# Patient Record
Sex: Female | Born: 1965 | Race: White | Hispanic: No | Marital: Married | Smoking: Former smoker
Health system: Southern US, Community
[De-identification: ages and names within clinical notes are randomized; demographics above are authoritative.]

## PROBLEM LIST (undated history)

## (undated) ENCOUNTER — Emergency Department (HOSPITAL_COMMUNITY): Payer: Commercial Managed Care - PPO | Source: Home / Self Care

## (undated) DIAGNOSIS — R519 Headache, unspecified: Secondary | ICD-10-CM

## (undated) DIAGNOSIS — K859 Acute pancreatitis without necrosis or infection, unspecified: Secondary | ICD-10-CM

## (undated) DIAGNOSIS — G43909 Migraine, unspecified, not intractable, without status migrainosus: Secondary | ICD-10-CM

## (undated) DIAGNOSIS — K219 Gastro-esophageal reflux disease without esophagitis: Secondary | ICD-10-CM

## (undated) DIAGNOSIS — H6990 Unspecified Eustachian tube disorder, unspecified ear: Secondary | ICD-10-CM

## (undated) DIAGNOSIS — Z923 Personal history of irradiation: Secondary | ICD-10-CM

## (undated) DIAGNOSIS — E7800 Pure hypercholesterolemia, unspecified: Secondary | ICD-10-CM

## (undated) DIAGNOSIS — J189 Pneumonia, unspecified organism: Secondary | ICD-10-CM

## (undated) DIAGNOSIS — C801 Malignant (primary) neoplasm, unspecified: Secondary | ICD-10-CM

## (undated) DIAGNOSIS — H9190 Unspecified hearing loss, unspecified ear: Secondary | ICD-10-CM

## (undated) DIAGNOSIS — J342 Deviated nasal septum: Secondary | ICD-10-CM

## (undated) DIAGNOSIS — I87393 Chronic venous hypertension (idiopathic) with other complications of bilateral lower extremity: Secondary | ICD-10-CM

## (undated) DIAGNOSIS — H6980 Other specified disorders of Eustachian tube, unspecified ear: Secondary | ICD-10-CM

## (undated) DIAGNOSIS — J449 Chronic obstructive pulmonary disease, unspecified: Secondary | ICD-10-CM

## (undated) DIAGNOSIS — R109 Unspecified abdominal pain: Secondary | ICD-10-CM

## (undated) DIAGNOSIS — G8929 Other chronic pain: Secondary | ICD-10-CM

## (undated) DIAGNOSIS — J309 Allergic rhinitis, unspecified: Secondary | ICD-10-CM

## (undated) HISTORY — PX: WRIST SURGERY: SHX841

## (undated) HISTORY — DX: Acute pancreatitis without necrosis or infection, unspecified: K85.90

## (undated) HISTORY — DX: Personal history of irradiation: Z92.3

## (undated) HISTORY — DX: Chronic obstructive pulmonary disease, unspecified: J44.9

## (undated) HISTORY — PX: LAPAROSCOPIC ENDOMETRIOSIS FULGURATION: SUR769

## (undated) HISTORY — PX: TUBAL LIGATION: SHX77

## (undated) HISTORY — PX: TYMPANOSTOMY TUBE PLACEMENT: SHX32

## (undated) HISTORY — PX: ADENOIDECTOMY: SUR15

## (undated) HISTORY — PX: TONSILLECTOMY AND ADENOIDECTOMY: SUR1326

## (undated) HISTORY — PX: APPENDECTOMY: SHX54

## (undated) HISTORY — PX: KNEE ARTHROSCOPY: SHX127

## (undated) HISTORY — DX: Chronic venous hypertension (idiopathic) with other complications of bilateral lower extremity: I87.393

## (undated) MED FILL — Dexamethasone Sodium Phosphate Inj 100 MG/10ML: INTRAMUSCULAR | Qty: 1 | Status: AC

---
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
## 1993-02-01 HISTORY — PX: ABDOMINAL HYSTERECTOMY: SHX81

## 1997-05-29 ENCOUNTER — Encounter: Admission: RE | Admit: 1997-05-29 | Discharge: 1997-05-29 | Payer: Self-pay | Admitting: Family Medicine

## 1997-07-29 ENCOUNTER — Encounter: Admission: RE | Admit: 1997-07-29 | Discharge: 1997-07-29 | Payer: Self-pay | Admitting: Family Medicine

## 1997-07-31 ENCOUNTER — Inpatient Hospital Stay (HOSPITAL_COMMUNITY): Admission: EM | Admit: 1997-07-31 | Discharge: 1997-08-01 | Payer: Self-pay | Admitting: Emergency Medicine

## 1997-10-21 ENCOUNTER — Encounter: Admission: RE | Admit: 1997-10-21 | Discharge: 1998-01-19 | Payer: Self-pay

## 1997-10-21 ENCOUNTER — Emergency Department (HOSPITAL_COMMUNITY): Admission: EM | Admit: 1997-10-21 | Discharge: 1997-10-21 | Payer: Self-pay | Admitting: Emergency Medicine

## 1997-10-23 ENCOUNTER — Encounter: Admission: RE | Admit: 1997-10-23 | Discharge: 1998-01-21 | Payer: Self-pay

## 1997-10-23 ENCOUNTER — Encounter: Admission: RE | Admit: 1997-10-23 | Discharge: 1997-10-23 | Payer: Self-pay | Admitting: Family Medicine

## 1997-10-30 ENCOUNTER — Encounter: Admission: RE | Admit: 1997-10-30 | Discharge: 1997-10-30 | Payer: Self-pay | Admitting: Family Medicine

## 1997-11-12 ENCOUNTER — Encounter: Admission: RE | Admit: 1997-11-12 | Discharge: 1997-11-12 | Payer: Self-pay | Admitting: Family Medicine

## 1997-11-20 ENCOUNTER — Encounter: Admission: RE | Admit: 1997-11-20 | Discharge: 1997-11-20 | Payer: Self-pay | Admitting: Family Medicine

## 1997-12-30 ENCOUNTER — Encounter: Admission: RE | Admit: 1997-12-30 | Discharge: 1998-03-30 | Payer: Self-pay | Admitting: *Deleted

## 1998-01-14 ENCOUNTER — Encounter: Admission: RE | Admit: 1998-01-14 | Discharge: 1998-02-10 | Payer: Self-pay | Admitting: *Deleted

## 1998-02-14 ENCOUNTER — Encounter: Admission: RE | Admit: 1998-02-14 | Discharge: 1998-02-14 | Payer: Self-pay | Admitting: Family Medicine

## 1998-06-23 ENCOUNTER — Encounter: Admission: RE | Admit: 1998-06-23 | Discharge: 1998-06-23 | Payer: Self-pay | Admitting: Family Medicine

## 1998-08-06 ENCOUNTER — Encounter: Admission: RE | Admit: 1998-08-06 | Discharge: 1998-08-06 | Payer: Self-pay | Admitting: Family Medicine

## 1998-09-24 ENCOUNTER — Encounter: Admission: RE | Admit: 1998-09-24 | Discharge: 1998-09-24 | Payer: Self-pay | Admitting: Family Medicine

## 1998-10-10 ENCOUNTER — Encounter: Admission: RE | Admit: 1998-10-10 | Discharge: 1998-10-10 | Payer: Self-pay | Admitting: Family Medicine

## 1998-11-28 ENCOUNTER — Encounter: Admission: RE | Admit: 1998-11-28 | Discharge: 1998-11-28 | Payer: Self-pay | Admitting: Sports Medicine

## 1999-05-05 ENCOUNTER — Ambulatory Visit (HOSPITAL_COMMUNITY): Admission: RE | Admit: 1999-05-05 | Discharge: 1999-05-05 | Payer: Self-pay | Admitting: Family Medicine

## 1999-05-05 ENCOUNTER — Encounter: Payer: Self-pay | Admitting: Internal Medicine

## 2001-09-13 ENCOUNTER — Encounter: Payer: Self-pay | Admitting: Emergency Medicine

## 2001-09-13 ENCOUNTER — Emergency Department (HOSPITAL_COMMUNITY): Admission: EM | Admit: 2001-09-13 | Discharge: 2001-09-13 | Payer: Self-pay | Admitting: Emergency Medicine

## 2001-09-30 ENCOUNTER — Encounter: Payer: Self-pay | Admitting: Chiropractic Medicine

## 2001-09-30 ENCOUNTER — Ambulatory Visit (HOSPITAL_COMMUNITY): Admission: RE | Admit: 2001-09-30 | Discharge: 2001-09-30 | Payer: Self-pay | Admitting: Chiropractic Medicine

## 2001-10-17 ENCOUNTER — Encounter: Payer: Self-pay | Admitting: Emergency Medicine

## 2001-10-17 ENCOUNTER — Emergency Department (HOSPITAL_COMMUNITY): Admission: EM | Admit: 2001-10-17 | Discharge: 2001-10-17 | Payer: Self-pay | Admitting: Emergency Medicine

## 2002-03-05 ENCOUNTER — Emergency Department (HOSPITAL_COMMUNITY): Admission: EM | Admit: 2002-03-05 | Discharge: 2002-03-05 | Payer: Self-pay | Admitting: Emergency Medicine

## 2002-03-13 ENCOUNTER — Encounter: Payer: Self-pay | Admitting: Emergency Medicine

## 2002-03-13 ENCOUNTER — Emergency Department (HOSPITAL_COMMUNITY): Admission: EM | Admit: 2002-03-13 | Discharge: 2002-03-13 | Payer: Self-pay | Admitting: Emergency Medicine

## 2002-08-07 ENCOUNTER — Other Ambulatory Visit: Admission: RE | Admit: 2002-08-07 | Discharge: 2002-08-07 | Payer: Self-pay | Admitting: Family Medicine

## 2002-11-27 ENCOUNTER — Ambulatory Visit (HOSPITAL_BASED_OUTPATIENT_CLINIC_OR_DEPARTMENT_OTHER): Admission: RE | Admit: 2002-11-27 | Discharge: 2002-11-27 | Payer: Self-pay | Admitting: Orthopedic Surgery

## 2003-05-17 ENCOUNTER — Encounter: Admission: RE | Admit: 2003-05-17 | Discharge: 2003-05-17 | Payer: Self-pay | Admitting: Family Medicine

## 2003-12-20 ENCOUNTER — Emergency Department (HOSPITAL_COMMUNITY): Admission: EM | Admit: 2003-12-20 | Discharge: 2003-12-20 | Payer: Self-pay | Admitting: Emergency Medicine

## 2009-07-25 ENCOUNTER — Ambulatory Visit (HOSPITAL_BASED_OUTPATIENT_CLINIC_OR_DEPARTMENT_OTHER): Admission: RE | Admit: 2009-07-25 | Discharge: 2009-07-25 | Payer: Self-pay | Admitting: Orthopedic Surgery

## 2010-01-10 ENCOUNTER — Emergency Department (HOSPITAL_COMMUNITY)
Admission: EM | Admit: 2010-01-10 | Discharge: 2010-01-10 | Payer: Self-pay | Source: Home / Self Care | Admitting: Emergency Medicine

## 2010-02-22 ENCOUNTER — Encounter: Payer: Self-pay | Admitting: Family Medicine

## 2010-06-19 NOTE — Op Note (Signed)
NAME:  Julie Nicholson, Julie Nicholson                        ACCOUNT NO.:  [id number]   MEDICAL RECORD NO.:  [id number]                   PATIENT TYPE:  AMB   LOCATION:  DSC                                  FACILITY:  [facility]   PHYSICIAN:  Leonides Grills, M.D.                  DATE OF BIRTH:  05/05/1965   DATE OF PROCEDURE:  11/27/2002  DATE OF DISCHARGE:                                 OPERATIVE REPORT   PREOPERATIVE DIAGNOSIS:  Right anterolateral ankle impingement syndrome.   POSTOPERATIVE DIAGNOSIS:  Right anterolateral ankle impingement syndrome.   OPERATION/PROCEDURE:  Right ankle arthroscopy with extensive debridement.   ANESTHESIA:  General endotracheal tube.   SURGEON:  Leonides Grills, M.D.   ASSISTANT:  None.   COMPLICATIONS:  None.   DISPOSITION:  Stable to PR.   INDICATIONS:  A 45 year old female who has had longstanding anterolateral  ankle pain that responded with an injection in regards to removing all her  pain but this recurred.  Due to the fact that this was interfering with her  life where she cannot do what she wants to do and she was tender directly  over the accessory tib-fib ligament, she was consented for the above  procedure.  All risks which include infection, nerve and vessel injury,  persistent pain, worsening pain, stiffness, arthritis and recurrence were  all explained.  Questions were encouraged and answered.   DESCRIPTION OF PROCEDURE:  The patient was brought to the operating room and  placed in the supine position.  After adequate general endotracheal tube  anesthesia was administered as well as Ancef 1 g IV piggyback.  The right  lower extremity was then prepped and draped in a sterile manner over the  proximally placed thigh tourniquet.  The limb was gravity exsanguinated.  The tourniquet was not used.  The anterior tibialis as well as the  superficial peroneal nerve were then mapped out.  A 20 cc of normal saline  was then instilled into the ankle from  anterolaterally.  A nick and spread  technique was then used to establish the anteromedial portal.  A blunt-  tipped trocar with cannula was then placed followed by a camera.  Under  direct visualization, the anterolateral portal was then created using a  spinal needle followed by the nick and spread technique.  The  instrumentation was then placed.  The radiofrequency bevel was then used to  remove all the synovitis that was located on the anterior aspect of the  ankle and with the up-biter, this was used to remove the accessory tib-fib  ligament that was impinging against the anterolateral aspect of the ankle  when the ankle was dorsiflexed and plantarflexed.  This was very obvious and  pictures were obtained.  Synovitis was then removed from the lateral gutter  of the ankle.  The camera was then removed.  The camera was then placed in  the anterolateral  portal.  Instrumentation through anteromedially, synovitis  was then removed.  Once this was removed, the cameras were removed.  Sutures  were closed with 4-0 nylon.  Marcaine without epinephrine was instilled in  the ankle.  A sterile dressing was applied.  CAM walker boot was applied.  The patient was table to the PR.                                                Leonides Grills, M.D.    PB/MEDQ  D:  11/27/2002  T:  11/27/2002  Job:  [id number]

## 2010-09-11 ENCOUNTER — Emergency Department (HOSPITAL_COMMUNITY)
Admission: EM | Admit: 2010-09-11 | Discharge: 2010-09-11 | Disposition: A | Discharge status: Home / Self Care | Payer: BC Managed Care – PPO | Attending: Emergency Medicine | Admitting: Emergency Medicine

## 2010-09-11 ENCOUNTER — Emergency Department (HOSPITAL_COMMUNITY): Payer: BC Managed Care – PPO

## 2010-09-11 DIAGNOSIS — E876 Hypokalemia: Secondary | ICD-10-CM | POA: Insufficient documentation

## 2010-09-11 DIAGNOSIS — R6889 Other general symptoms and signs: Secondary | ICD-10-CM | POA: Insufficient documentation

## 2010-09-11 DIAGNOSIS — H6590 Unspecified nonsuppurative otitis media, unspecified ear: Secondary | ICD-10-CM | POA: Insufficient documentation

## 2010-09-11 DIAGNOSIS — R059 Cough, unspecified: Secondary | ICD-10-CM | POA: Insufficient documentation

## 2010-09-11 DIAGNOSIS — R6883 Chills (without fever): Secondary | ICD-10-CM | POA: Insufficient documentation

## 2010-09-11 DIAGNOSIS — J40 Bronchitis, not specified as acute or chronic: Secondary | ICD-10-CM | POA: Insufficient documentation

## 2010-09-11 DIAGNOSIS — R51 Headache: Secondary | ICD-10-CM | POA: Insufficient documentation

## 2010-09-11 DIAGNOSIS — R05 Cough: Secondary | ICD-10-CM | POA: Insufficient documentation

## 2010-09-11 LAB — POCT I-STAT, CHEM 8
Calcium, Ion: 1.17 mmol/L (ref 1.12–1.32)
HCT: 42 % (ref 36.0–46.0)
Hemoglobin: 14.3 g/dL (ref 12.0–15.0)
TCO2: 25 mmol/L (ref 0–100)

## 2010-09-11 LAB — DIFFERENTIAL
Eosinophils Absolute: 0 10*3/uL (ref 0.0–0.7)
Eosinophils Relative: 0 % (ref 0–5)
Lymphs Abs: 0.9 10*3/uL (ref 0.7–4.0)
Monocytes Relative: 8 % (ref 3–12)

## 2010-09-11 LAB — CBC
MCH: 32.9 pg (ref 26.0–34.0)
MCHC: 35.7 g/dL (ref 30.0–36.0)
MCV: 92.3 fL (ref 78.0–100.0)
Platelets: 192 10*3/uL (ref 150–400)
RDW: 12.6 % (ref 11.5–15.5)

## 2011-01-07 ENCOUNTER — Other Ambulatory Visit: Payer: Self-pay | Admitting: Family Medicine

## 2011-01-07 ENCOUNTER — Ambulatory Visit
Admission: RE | Admit: 2011-01-07 | Discharge: 2011-01-07 | Disposition: A | Discharge status: Home / Self Care | Payer: BC Managed Care – PPO | Source: Ambulatory Visit | Attending: Family Medicine | Admitting: Family Medicine

## 2011-01-07 DIAGNOSIS — R062 Wheezing: Secondary | ICD-10-CM

## 2011-01-07 DIAGNOSIS — R05 Cough: Secondary | ICD-10-CM

## 2011-01-07 DIAGNOSIS — R059 Cough, unspecified: Secondary | ICD-10-CM

## 2013-02-08 ENCOUNTER — Encounter (HOSPITAL_COMMUNITY): Payer: Self-pay | Admitting: Emergency Medicine

## 2013-02-08 ENCOUNTER — Emergency Department (HOSPITAL_COMMUNITY)
Admission: EM | Admit: 2013-02-08 | Discharge: 2013-02-08 | Disposition: A | Discharge status: Home / Self Care | Payer: BC Managed Care – PPO | Attending: Emergency Medicine | Admitting: Emergency Medicine

## 2013-02-08 ENCOUNTER — Emergency Department (HOSPITAL_COMMUNITY): Payer: BC Managed Care – PPO

## 2013-02-08 DIAGNOSIS — R1011 Right upper quadrant pain: Secondary | ICD-10-CM | POA: Insufficient documentation

## 2013-02-08 DIAGNOSIS — Z90710 Acquired absence of both cervix and uterus: Secondary | ICD-10-CM | POA: Insufficient documentation

## 2013-02-08 DIAGNOSIS — Z7982 Long term (current) use of aspirin: Secondary | ICD-10-CM | POA: Insufficient documentation

## 2013-02-08 DIAGNOSIS — F17200 Nicotine dependence, unspecified, uncomplicated: Secondary | ICD-10-CM | POA: Insufficient documentation

## 2013-02-08 DIAGNOSIS — R110 Nausea: Secondary | ICD-10-CM | POA: Insufficient documentation

## 2013-02-08 DIAGNOSIS — K859 Acute pancreatitis without necrosis or infection, unspecified: Secondary | ICD-10-CM | POA: Insufficient documentation

## 2013-02-08 DIAGNOSIS — Z79899 Other long term (current) drug therapy: Secondary | ICD-10-CM | POA: Insufficient documentation

## 2013-02-08 HISTORY — DX: Acute pancreatitis without necrosis or infection, unspecified: K85.90

## 2013-02-08 LAB — CBC WITH DIFFERENTIAL/PLATELET
BASOS PCT: 0 % (ref 0–1)
Basophils Absolute: 0 10*3/uL (ref 0.0–0.1)
EOS ABS: 0.1 10*3/uL (ref 0.0–0.7)
Eosinophils Relative: 1 % (ref 0–5)
HEMATOCRIT: 40.4 % (ref 36.0–46.0)
HEMOGLOBIN: 14.2 g/dL (ref 12.0–15.0)
LYMPHS ABS: 3.3 10*3/uL (ref 0.7–4.0)
Lymphocytes Relative: 37 % (ref 12–46)
MCH: 33.6 pg (ref 26.0–34.0)
MCHC: 35.1 g/dL (ref 30.0–36.0)
MCV: 95.5 fL (ref 78.0–100.0)
MONO ABS: 0.9 10*3/uL (ref 0.1–1.0)
MONOS PCT: 10 % (ref 3–12)
NEUTROS PCT: 52 % (ref 43–77)
Neutro Abs: 4.6 10*3/uL (ref 1.7–7.7)
Platelets: 333 10*3/uL (ref 150–400)
RBC: 4.23 MIL/uL (ref 3.87–5.11)
RDW: 12.5 % (ref 11.5–15.5)
WBC: 8.9 10*3/uL (ref 4.0–10.5)

## 2013-02-08 LAB — COMPREHENSIVE METABOLIC PANEL
ALBUMIN: 3.9 g/dL (ref 3.5–5.2)
ALK PHOS: 135 U/L — AB (ref 39–117)
ALT: 27 U/L (ref 0–35)
AST: 18 U/L (ref 0–37)
BILIRUBIN TOTAL: 0.4 mg/dL (ref 0.3–1.2)
BUN: 15 mg/dL (ref 6–23)
CHLORIDE: 100 meq/L (ref 96–112)
CO2: 25 mEq/L (ref 19–32)
CREATININE: 0.69 mg/dL (ref 0.50–1.10)
Calcium: 9.7 mg/dL (ref 8.4–10.5)
GFR calc non Af Amer: >90 mL/min (ref >90)
GLUCOSE: 108 mg/dL — AB (ref 70–99)
POTASSIUM: 3.6 meq/L — AB (ref 3.7–5.3)
Sodium: 140 mEq/L (ref 137–147)
TOTAL PROTEIN: 7.7 g/dL (ref 6.0–8.3)

## 2013-02-08 LAB — URINALYSIS, ROUTINE W REFLEX MICROSCOPIC
BILIRUBIN URINE: NEGATIVE
Glucose, UA: NEGATIVE mg/dL
KETONES UR: NEGATIVE mg/dL
LEUKOCYTES UA: NEGATIVE
NITRITE: NEGATIVE
PROTEIN: NEGATIVE mg/dL
Specific Gravity, Urine: 1.002 — ABNORMAL LOW (ref 1.005–1.030)
UROBILINOGEN UA: 0.2 mg/dL (ref 0.0–1.0)
pH: 7 (ref 5.0–8.0)

## 2013-02-08 LAB — URINE MICROSCOPIC-ADD ON

## 2013-02-08 LAB — LIPASE, BLOOD: LIPASE: 104 U/L — AB (ref 11–59)

## 2013-02-08 MED ORDER — ONDANSETRON HCL 4 MG/2ML IJ SOLN
4.0000 mg | Freq: Once | INTRAMUSCULAR | Status: AC
  Administered 2013-02-08: 4 mg via INTRAVENOUS
  Filled 2013-02-08: qty 2

## 2013-02-08 MED ORDER — HYDROMORPHONE HCL PF 1 MG/ML IJ SOLN
1.0000 mg | Freq: Once | INTRAMUSCULAR | Status: AC
  Administered 2013-02-08: 1 mg via INTRAVENOUS
  Filled 2013-02-08: qty 1

## 2013-02-08 MED ORDER — PROMETHAZINE HCL 25 MG PO TABS: 25.0000 mg | ORAL_TABLET | Freq: Four times a day (QID) | ORAL | Status: DC | PRN

## 2013-02-08 MED ORDER — ONDANSETRON 8 MG PO TBDP: 8.0000 mg | ORAL_TABLET | Freq: Three times a day (TID) | ORAL | Status: DC | PRN

## 2013-02-08 MED ORDER — OXYCODONE-ACETAMINOPHEN 7.5-325 MG PO TABS: 1.0000 | ORAL_TABLET | ORAL | Status: DC | PRN

## 2013-02-08 MED ORDER — IOHEXOL 300 MG/ML  SOLN
100.0000 mL | Freq: Once | INTRAMUSCULAR | Status: AC | PRN
  Administered 2013-02-08: 100 mL via INTRAVENOUS

## 2013-02-08 MED ORDER — IOHEXOL 300 MG/ML  SOLN
25.0000 mL | Freq: Once | INTRAMUSCULAR | Status: AC | PRN
  Administered 2013-02-08: 25 mL via ORAL

## 2013-02-08 NOTE — ED Notes (Signed)
PT c/o R flank pain that radiates to RUQ.  Now c/o constant nausea, "burning burps" and intermittent fevers since Christmas.  Denies changes in bowel or bladder habits.

## 2013-02-08 NOTE — Discharge Instructions (Signed)
Acute Pancreatitis Acute pancreatitis is a disease in which the pancreas becomes suddenly inflamed. The pancreas is a large gland located behind your stomach. The pancreas produces enzymes that help digest food. The pancreas also releases the hormones glucagon and insulin that help regulate blood sugar. Damage to the pancreas occurs when the digestive enzymes from the pancreas are activated and begin attacking the pancreas before being released into the intestine. Most acute attacks last a couple of days and can cause serious complications. Some people become dehydrated and develop low blood pressure. In severe cases, bleeding into the pancreas can lead to shock and can be life-threatening. The lungs, heart, and kidneys may fail. CAUSES  Pancreatitis can happen to anyone. In some cases, the cause is unknown. Most cases are caused by:  Alcohol abuse.  Gallstones. Other less common causes are:  Certain medicines.  Exposure to certain chemicals.  Infection.  Damage caused by an accident (trauma).  Abdominal surgery. SYMPTOMS   Pain in the upper abdomen that may radiate to the back.  Tenderness and swelling of the abdomen.  Nausea and vomiting. DIAGNOSIS  Your caregiver will perform a physical exam. Blood and stool tests may be done to confirm the diagnosis. Imaging tests may also be done, such as X-rays, CT scans, or an ultrasound of the abdomen. TREATMENT  Treatment usually requires a stay in the hospital. Treatment may include:  Pain medicine.  Fluid replacement through an intravenous line (IV).  Placing a tube in the stomach to remove stomach contents and control vomiting.  Not eating for 3 or 4 days. This gives your pancreas a rest, because enzymes are not being produced that can cause further damage.  Antibiotic medicines if your condition is caused by an infection.  Surgery of the pancreas or gallbladder. HOME CARE INSTRUCTIONS   Follow the diet advised by your  caregiver. This may involve avoiding alcohol and decreasing the amount of fat in your diet.  Eat smaller, more frequent meals. This reduces the amount of digestive juices the pancreas produces.  Drink enough fluids to keep your urine clear or pale yellow.  Only take over-the-counter or prescription medicines as directed by your caregiver.  Avoid drinking alcohol if it caused your condition.  Do not smoke.  Get plenty of rest.  Check your blood sugar at home as directed by your caregiver.  Keep all follow-up appointments as directed by your caregiver. SEEK MEDICAL CARE IF:   You do not recover as quickly as expected.  You develop new or worsening symptoms.  You have persistent pain, weakness, or nausea.  You recover and then have another episode of pain. SEEK IMMEDIATE MEDICAL CARE IF:   You are unable to eat or keep fluids down.  Your pain becomes severe.  You have a fever or persistent symptoms for more than 2 to 3 days.  You have a fever and your symptoms suddenly get worse.  Your skin or the white part of your eyes turn yellow (jaundice).  You develop vomiting.  You feel dizzy, or you faint.  Your blood sugar is high (over 300 mg/dL). MAKE SURE YOU:   Understand these instructions.  Will watch your condition.  Will get help right away if you are not doing well or get worse. Document Released: 01/18/2005 Document Revised: 07/20/2011 Document Reviewed: 04/29/2011 [facility] Patient Information 2014 [place]. Clear Liquid Diet The clear liquid diet consists of foods that are liquid or will become liquid at room temperature. Examples of foods allowed  on a clear liquid diet include fruit juice, broth or bouillon, gelatin, or frozen ice pops. You should be able to see through the liquid. The purpose of this diet is to provide the necessary fluids, electrolytes (such as sodium and potassium), and energy to keep the body functioning during times when you are not  able to consume a regular diet. A clear liquid diet should not be continued for long periods of time, as it is not nutritionally adequate.  A CLEAR LIQUID DIET MAY BE NEEDED:  When a sudden-onset (acute) condition occurs before or after surgery.   As the first step in oral feeding.   For fluid and electrolyte replacement in diarrheal diseases.   As a diet before certain medical tests are performed.  ADEQUACY The clear liquid diet is adequate only in ascorbic acid, according to the Recommended Dietary Allowances of the [employer].  CHOOSING FOODS Breads and Starches  Allowed: None are allowed.   Avoid: All are to be avoided.  Vegetables  Allowed: Strained vegetable juices.   Avoid: Any others.  Fruit  Allowed: Strained fruit juices and fruit drinks. Include 1 serving of citrus or vitamin C-enriched fruit juice daily.   Avoid: Any others.  Meat and Meat Substitutes  Allowed: None are allowed.   Avoid: All are to be avoided.  Milk Products  Allowed: None are allowed.   Avoid: All are to be avoided.  Soups and Combination Foods  Allowed: Clear bouillon, broth, or strained broth-based soups.   Avoid: Any others.  Desserts and Sweets  Allowed: Sugar, honey. High-protein gelatin. Flavored gelatin, ices, or frozen ice pops that do not contain milk.   Avoid: Any others.  Fats and Oils  Allowed: None are allowed.   Avoid: All are to be avoided.  Beverages  Allowed: Cereal beverages, coffee (regular or decaffeinated), tea, or soda at the discretion of your health care provider.   Avoid: Any others.  Condiments  Allowed: Salt.   Avoid: Any others, including pepper.  Supplements  Allowed: Liquid nutrition beverages that you can see through.   Avoid: Any others that contain lactose or fiber. SAMPLE MEAL PLAN Breakfast  4 oz (120 mL) strained orange juice.   to 1 cup (120 to 240 mL)  gelatin (plain or fortified).  1 cup (240 mL) beverage (coffee or tea).  Sugar, if desired. Midmorning Snack   cup (120 mL) gelatin (plain or fortified). Lunch  1 cup (240 mL) broth or consomm.  4 oz (120 mL) strained grapefruit juice.   cup (120 mL) gelatin (plain or fortified).  1 cup (240 mL) beverage (coffee or tea).  Sugar, if desired. Midafternoon Snack   cup (120 mL) fruit ice.   cup (120 mL) strained fruit juice. Dinner  1 cup (240 mL) broth or consomm.   cup (120 mL) cranberry juice.   cup (120 mL) flavored gelatin (plain or fortified).  1 cup (240 mL) beverage (coffee or tea).  Sugar, if desired. Evening Snack  4 oz (120 mL) strained apple juice (vitamin C-fortified).   cup (120 mL) flavored gelatin (plain or fortified). MAKE SURE YOU:  Understand these instructions.  Will watch your child's condition.  Will get help right away if your child is not doing well or gets worse. Document Released: 01/18/2005 Document Revised: 09/20/2012 Document Reviewed: 06/20/2012 [facility] Patient Information 2014 [place].

## 2013-02-08 NOTE — ED Provider Notes (Signed)
CSN: [id number]     Arrival date & time 02/08/13  1547 History   First MD Initiated Contact with Patient 02/08/13 1902     Chief Complaint  Patient presents with  . Flank Pain    radiates to RUQ   (Consider location/radiation/quality/duration/timing/severity/associated sxs/prior Treatment) Patient is a 48 y.o. female presenting with flank pain. The history is provided by the patient.  Flank Pain   Patient here complaining of rapid quadrant and epigastric pain x2 days which is been worse recently. Symptoms worse after she eats. She's had nausea no vomiting. No fever or chills. Denies any urinary symptoms. No prior history of cholelithiasis. Has used antacids without relief. History reviewed. No pertinent past medical history. Past Surgical History  Procedure Laterality Date  . Appendectomy    . Knee cartilage surgery    . Abdominal hysterectomy  1995    tx endometriosis   No family history on file. History  Substance Use Topics  . Smoking status: Current Every Day Smoker -- 0.25 packs/day    Types: Cigarettes  . Smokeless tobacco: Not on file  . Alcohol Use: No   OB History   Grav Para Term Preterm Abortions TAB SAB Ect Mult Living                 Review of Systems  Genitourinary: Positive for flank pain.  All other systems reviewed and are negative.    Allergies  Review of patient's allergies indicates no known allergies.  Home Medications   Current Outpatient Rx  Name  Route  Sig  Dispense  Refill  . aspirin EC 81 MG tablet   Oral   Take 81 mg by mouth at bedtime.         Marland Kitchen atorvastatin (LIPITOR) 40 MG tablet   Oral   Take 40 mg by mouth at bedtime.         . Biotin 5000 MCG TABS   Oral   Take 5,000 mcg by mouth at bedtime.         . Omega-3 Fatty Acids (FISH OIL PO)   Oral   Take 1 tablet by mouth at bedtime.          BP 151/82  Pulse 86  Temp(Src) 97.6 F (36.4 C) (Oral)  Resp 18  Ht 5\' 2"  (1.575 m)  Wt 128 lb (58.06 kg)  BMI 23.41  kg/m2  SpO2 98% Physical Exam  Nursing note and vitals reviewed. Constitutional: She is oriented to person, place, and time. She appears well-developed and well-nourished.  Non-toxic appearance. No distress.  HENT:  Head: Normocephalic and atraumatic.  Eyes: Conjunctivae, EOM and lids are normal. Pupils are equal, round, and reactive to light.  Neck: Normal range of motion. Neck supple. No tracheal deviation present. No mass present.  Cardiovascular: Normal rate, regular rhythm and normal heart sounds.  Exam reveals no gallop.   No murmur heard. Pulmonary/Chest: Effort normal and breath sounds normal. No stridor. No respiratory distress. She has no decreased breath sounds. She has no wheezes. She has no rhonchi. She has no rales.  Abdominal: Soft. Normal appearance and bowel sounds are normal. She exhibits no distension. There is tenderness in the right upper quadrant and epigastric area. There is guarding. There is no rigidity, no rebound and no CVA tenderness.  Musculoskeletal: Normal range of motion. She exhibits no edema and no tenderness.  Neurological: She is alert and oriented to person, place, and time. She has normal strength. No cranial nerve deficit  or sensory deficit. GCS eye subscore is 4. GCS verbal subscore is 5. GCS motor subscore is 6.  Skin: Skin is warm and dry. No abrasion and no rash noted.  Psychiatric: She has a normal mood and affect. Her speech is normal and behavior is normal.    ED Course  Procedures (including critical care time) Labs Review Labs Reviewed  COMPREHENSIVE METABOLIC PANEL - Abnormal; Notable for the following:    Potassium 3.6 (*)    Glucose, Bld 108 (*)    Alkaline Phosphatase 135 (*)    All other components within normal limits  LIPASE, BLOOD - Abnormal; Notable for the following:    Lipase 104 (*)    All other components within normal limits  URINALYSIS, ROUTINE W REFLEX MICROSCOPIC - Abnormal; Notable for the following:    Specific  Gravity, Urine 1.002 (*)    Hgb urine dipstick TRACE (*)    All other components within normal limits  CBC WITH DIFFERENTIAL  URINE MICROSCOPIC-ADD ON   Imaging Review No results found.  EKG Interpretation   None       MDM  No diagnosis found. Patient given IV fluids and pain meds here. She had an abdominal ultrasound that showed possible enlargement of the pancreas which was subsequently studied with an abdominal CT which showed mild pancreatic inflammation. Patient feels better at this time and has been offered admission for which she has deferred. Straight return precautions given as well as medications for pain and nausea and gastrointestinal referral    Leota Jacobsen, MD 02/08/13 2311

## 2013-02-08 NOTE — ED Notes (Signed)
Dr Allen at bedside  

## 2013-02-09 ENCOUNTER — Encounter: Payer: Self-pay | Admitting: Internal Medicine

## 2013-02-13 ENCOUNTER — Ambulatory Visit (INDEPENDENT_AMBULATORY_CARE_PROVIDER_SITE_OTHER): Payer: BC Managed Care – PPO | Admitting: Internal Medicine

## 2013-02-13 ENCOUNTER — Encounter: Payer: Self-pay | Admitting: Internal Medicine

## 2013-02-13 ENCOUNTER — Other Ambulatory Visit (INDEPENDENT_AMBULATORY_CARE_PROVIDER_SITE_OTHER): Payer: BC Managed Care – PPO

## 2013-02-13 VITALS — BP 112/64 | HR 80 | Ht 61.0 in | Wt 125.0 lb

## 2013-02-13 DIAGNOSIS — R748 Abnormal levels of other serum enzymes: Secondary | ICD-10-CM

## 2013-02-13 DIAGNOSIS — R1011 Right upper quadrant pain: Secondary | ICD-10-CM

## 2013-02-13 DIAGNOSIS — K8689 Other specified diseases of pancreas: Secondary | ICD-10-CM | POA: Insufficient documentation

## 2013-02-13 MED ORDER — OXYCODONE-ACETAMINOPHEN 7.5-325 MG PO TABS: 1.0000 | ORAL_TABLET | ORAL | Status: DC | PRN

## 2013-02-13 NOTE — Patient Instructions (Addendum)
Your physician has requested that you go to the basement for the following lab work before leaving today: Amylase, Lipase, Liver Function Test  You will be contacted by our office about an EUS procedure to hopefully be set up for 02/22/13 at [facility] and performed by Dr. Owens Loffler.  Today you have been given a written rx for Percocet to take to your pharmacy.  We will obtain your lab results from the past year from Dr. Willey Blade for Dr. Carlean Purl to review.  We are giving you a low fat diet to read and follow.  I appreciate the opportunity to care for you.

## 2013-02-13 NOTE — Progress Notes (Signed)
Subjective:    Patient ID: Julie Nicholson, female    DOB: December 16, 1965, 48 y.o.   MRN: [id number]  HPI Is a very nice middle-aged woman with about a 1 year history of nausea and and then some intermittent epigastric pain that has become more chronic. She went to the emergency department on January 8 because of increasing in persistence of epigastric pain. The pain is relatively constant, it disturbs sleep somewhat, and it may intensify after eating. She does not recall any injury to the abdominal area. She had a dilated pancreatic duct of 5 mm on ultrasound and CT scan without mass lesions or other findings, she had a lipase that was twice normal and an elevated alkaline phosphatase. She is taking narcotic pain medication with some relief. She is not nor has she been a drinker, she has hyperlipidemia on Lipitor but does not recall ever having triglyceride levels above 500,000. She has no prior history of pancreatitis. She's had some nausea as mentioned but no vomiting. She's gotten to the point where she cannot tolerate the day in and out pain. 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  No Known Allergies Outpatient Prescriptions Prior to Visit  Medication Sig Dispense Refill  . aspirin EC 81 MG tablet Take 81 mg by mouth at bedtime.      Marland Kitchen atorvastatin (LIPITOR) 40 MG tablet Take 40 mg by mouth at bedtime.      . Biotin 5000 MCG TABS Take 5,000 mcg by mouth at bedtime.      . Omega-3 Fatty Acids (FISH OIL PO) Take 1 tablet by mouth at bedtime.      . ondansetron (ZOFRAN ODT) 8 MG disintegrating tablet Take 1 tablet (8 mg total) by mouth every 8 (eight) hours as needed for nausea or vomiting.  20 tablet  0  . oxyCODONE-acetaminophen (PERCOCET) 7.5-325 MG per tablet Take 1 tablet by mouth every 4 (four) hours as needed for pain.  20 tablet  0  . promethazine (PHENERGAN) 25 MG tablet Take 1 tablet (25 mg total) by mouth every 6 (six) hours as needed for nausea or  vomiting.  30 tablet  0   No facility-administered medications prior to visit.   Past Medical History  Diagnosis Date  . Pancreatitis 02/08/2013  . COPD (chronic obstructive pulmonary disease)    Past Surgical History  Procedure Laterality Date  . Appendectomy    . Knee cartilage surgery    . Abdominal hysterectomy  1995    tx endometriosis   History   Social History  . Marital Status: Legally Separated    Spouse Name: N/A    Number of Children: N/A  . Years of Education: N/A   Social History Main Topics  . Smoking status: Former Smoker -- 0.25 packs/day    Types: Cigarettes    Quit date: 02/12/2013  . Smokeless tobacco: Never Used  . Alcohol Use: No  . Drug Use: No  . Sexual Activity: None   Other Topics Concern  . None   Social History Narrative  . Married, 1 son and 1 daughter   Family History  Problem Relation Age of Onset  . Diabetes Father   . Diabetes Mother   . COPD Mother   . COPD Maternal Grandfather   . COPD Maternal Aunt   . COPD Maternal Uncle        Review of Systems Positive for headaches, O. other review of  systems negative    Objective:   Physical Exam General:  Well-developed, well-nourished and in no acute distress Eyes:  anicteric. ENT:   Mouth and posterior pharynx free of lesions.  Neck:   supple w/o thyromegaly or mass.  Lungs: Clear to auscultation bilaterally. Heart:  S1S2, no rubs, murmurs, gallops. Abdomen:  soft, mild-mod tender RUQ, no hepatosplenomegaly, hernia, or mass and BS+.  Lymph:  no cervical or supraclavicular adenopathy. Extremities:   no edema Skin   no rash., tattoo R posterior shoulder Neuro:  A&O x 3.  Psych:  appropriate mood and  Affect.   Data Reviewed: Ultrasound and CT scan from 02/08/2013, labs ER notes from same date       Assessment & Plan:   1. Pancreatic duct dilated   2. Abnormal serum lipase level   3. Abnormal alkaline phosphatase test   4. RUQ pain    The cause of these problems  is not clear. She appears to have pancreatitis though not by CT, bilateral clinical scenario. There is no mass lesion to explain it. She could have an ampullary lesion, she could have a very small pancreatic lesion as well. She could have papillary stenosis.  Pyloric last records from her primary care to make sure her triglycerides are not markedly elevated though that seems unlikely given the scenario. I have refilled her narcotic pain medication. Low fat diet is recommended Amylase, lipase, LFTs to be checked today. Endoscopic ultrasound seems to make sense for the next step in the diagnostic evaluation, I have discussed with Dr. Ardis Hughs our endosonographer and he concurs. I've explained the risks benefits and indications of the procedure including possible fine-needle aspiration or biopsy.  I appreciate the opportunity to care for this patient.

## 2013-02-14 ENCOUNTER — Other Ambulatory Visit: Payer: Self-pay

## 2013-02-14 ENCOUNTER — Telehealth: Payer: Self-pay | Admitting: Internal Medicine

## 2013-02-14 ENCOUNTER — Encounter (HOSPITAL_COMMUNITY): Payer: Self-pay | Admitting: Pharmacy Technician

## 2013-02-14 ENCOUNTER — Telehealth: Payer: Self-pay

## 2013-02-14 ENCOUNTER — Encounter (HOSPITAL_COMMUNITY): Payer: Self-pay | Admitting: *Deleted

## 2013-02-14 DIAGNOSIS — K8689 Other specified diseases of pancreas: Secondary | ICD-10-CM

## 2013-02-14 LAB — HEPATIC FUNCTION PANEL
ALBUMIN: 4.1 g/dL (ref 3.5–5.2)
ALT: 27 U/L (ref 0–35)
AST: 21 U/L (ref 0–37)
Alkaline Phosphatase: 108 U/L (ref 39–117)
BILIRUBIN DIRECT: 0.1 mg/dL (ref 0.0–0.3)
Total Bilirubin: 0.8 mg/dL (ref 0.3–1.2)
Total Protein: 7.6 g/dL (ref 6.0–8.3)

## 2013-02-14 LAB — AMYLASE: Amylase: 61 U/L (ref 27–131)

## 2013-02-14 LAB — LIPASE: Lipase: 34 U/L (ref 11.0–59.0)

## 2013-02-14 NOTE — Telephone Encounter (Signed)
Message copied by Barron Alvine on Wed Feb 14, 2013  8:16 AM ------      Message from: Owens Loffler P      Created: Wed Feb 14, 2013  7:15 AM      Regarding: RE: EUS patient       Louann Hopson,      She needs upper EUS, radial +/- linear, 60 min, ++MAC.  DX; dilated PD.            Check to see about the remaining 7:30 spot this Friday.  If taken, then next Thursday.                  Jani Gravel get her in this week or next.  Thanks                        ----- Message -----         From: Gatha Mayer, MD         Sent: 02/13/2013   5:35 PM           To: Milus Banister, MD      Subject: EUS patient                                              This is the lady with dilated PD       ------

## 2013-02-14 NOTE — Progress Notes (Signed)
Quick Note:  LFT's, pancreas tests now NL ______

## 2013-02-14 NOTE — Telephone Encounter (Signed)
Pt has been scheduled and instructed, meds were reviewed

## 2013-02-14 NOTE — Telephone Encounter (Signed)
Patient advised that we will call with the results of the labs as soon as they are availab;e

## 2013-02-16 ENCOUNTER — Encounter (HOSPITAL_COMMUNITY): Payer: Self-pay | Admitting: *Deleted

## 2013-02-16 ENCOUNTER — Ambulatory Visit (HOSPITAL_COMMUNITY): Payer: BC Managed Care – PPO | Admitting: Anesthesiology

## 2013-02-16 ENCOUNTER — Encounter (HOSPITAL_COMMUNITY)
Admission: RE | Disposition: A | Discharge status: Home / Self Care | Payer: Self-pay | Source: Ambulatory Visit | Attending: Gastroenterology

## 2013-02-16 ENCOUNTER — Encounter (HOSPITAL_COMMUNITY): Payer: BC Managed Care – PPO | Admitting: Anesthesiology

## 2013-02-16 ENCOUNTER — Ambulatory Visit (HOSPITAL_COMMUNITY)
Admission: RE | Admit: 2013-02-16 | Discharge: 2013-02-16 | Disposition: A | Discharge status: Home / Self Care | Payer: BC Managed Care – PPO | Source: Ambulatory Visit | Attending: Gastroenterology | Admitting: Gastroenterology

## 2013-02-16 DIAGNOSIS — R109 Unspecified abdominal pain: Secondary | ICD-10-CM | POA: Insufficient documentation

## 2013-02-16 DIAGNOSIS — K8689 Other specified diseases of pancreas: Secondary | ICD-10-CM | POA: Insufficient documentation

## 2013-02-16 DIAGNOSIS — K2970 Gastritis, unspecified, without bleeding: Secondary | ICD-10-CM

## 2013-02-16 DIAGNOSIS — R1013 Epigastric pain: Secondary | ICD-10-CM | POA: Insufficient documentation

## 2013-02-16 DIAGNOSIS — R143 Flatulence: Secondary | ICD-10-CM

## 2013-02-16 DIAGNOSIS — J449 Chronic obstructive pulmonary disease, unspecified: Secondary | ICD-10-CM | POA: Insufficient documentation

## 2013-02-16 DIAGNOSIS — K319 Disease of stomach and duodenum, unspecified: Secondary | ICD-10-CM | POA: Insufficient documentation

## 2013-02-16 DIAGNOSIS — Z79899 Other long term (current) drug therapy: Secondary | ICD-10-CM | POA: Insufficient documentation

## 2013-02-16 DIAGNOSIS — Z7982 Long term (current) use of aspirin: Secondary | ICD-10-CM | POA: Insufficient documentation

## 2013-02-16 DIAGNOSIS — R110 Nausea: Secondary | ICD-10-CM | POA: Insufficient documentation

## 2013-02-16 DIAGNOSIS — E785 Hyperlipidemia, unspecified: Secondary | ICD-10-CM | POA: Insufficient documentation

## 2013-02-16 DIAGNOSIS — J4489 Other specified chronic obstructive pulmonary disease: Secondary | ICD-10-CM | POA: Insufficient documentation

## 2013-02-16 DIAGNOSIS — K2990 Gastroduodenitis, unspecified, without bleeding: Secondary | ICD-10-CM

## 2013-02-16 DIAGNOSIS — R748 Abnormal levels of other serum enzymes: Secondary | ICD-10-CM | POA: Insufficient documentation

## 2013-02-16 DIAGNOSIS — R141 Gas pain: Secondary | ICD-10-CM | POA: Insufficient documentation

## 2013-02-16 DIAGNOSIS — R142 Eructation: Secondary | ICD-10-CM | POA: Insufficient documentation

## 2013-02-16 HISTORY — PX: EUS: SHX5427

## 2013-02-16 SURGERY — UPPER ENDOSCOPIC ULTRASOUND (EUS) LINEAR
Anesthesia: Monitor Anesthesia Care

## 2013-02-16 MED ORDER — PROPOFOL INFUSION 10 MG/ML OPTIME
INTRAVENOUS | Status: DC | PRN
  Administered 2013-02-16 (×2): 140 ug/kg/min via INTRAVENOUS

## 2013-02-16 MED ORDER — PROPOFOL 10 MG/ML IV BOLUS
INTRAVENOUS | Status: DC | PRN
  Administered 2013-02-16: 30 mg via INTRAVENOUS

## 2013-02-16 MED ORDER — SODIUM CHLORIDE 0.9 % IV SOLN: INTRAVENOUS | Status: DC

## 2013-02-16 MED ORDER — PROPOFOL INFUSION 10 MG/ML OPTIME: INTRAVENOUS | Status: DC | PRN

## 2013-02-16 MED ORDER — PROPOFOL 10 MG/ML IV BOLUS
INTRAVENOUS | Status: AC
  Filled 2013-02-16: qty 20

## 2013-02-16 MED ORDER — LACTATED RINGERS IV SOLN
INTRAVENOUS | Status: DC
  Administered 2013-02-16: 1000 mL via INTRAVENOUS

## 2013-02-16 NOTE — Transfer of Care (Signed)
Immediate Anesthesia Transfer of Care Note  Patient: Julie Nicholson  Procedure(s) Performed: Procedure(s) with comments: UPPER ENDOSCOPIC ULTRASOUND (EUS) LINEAR (N/A) - radial linear  Patient Location: PACU and Endoscopy Unit  Anesthesia Type:MAC  Level of Consciousness: sedated and patient cooperative  Airway & Oxygen Therapy: Patient Spontanous Breathing and Patient connected to nasal cannula oxygen  Post-op Assessment: Report given to PACU RN and Post -op Vital signs reviewed and stable  Post vital signs: Reviewed and stable  Complications: No apparent anesthesia complications

## 2013-02-16 NOTE — H&P (View-Only) (Signed)
Subjective:    Patient ID: Julie Nicholson, female    DOB: 05/21/1965, 48 y.o.   MRN: [id number]  HPI Is a very nice middle-aged woman with about a 1 year history of nausea and and then some intermittent epigastric pain that has become more chronic. She went to the emergency department on January 8 because of increasing in persistence of epigastric pain. The pain is relatively constant, it disturbs sleep somewhat, and it may intensify after eating. She does not recall any injury to the abdominal area. She had a dilated pancreatic duct of 5 mm on ultrasound and CT scan without mass lesions or other findings, she had a lipase that was twice normal and an elevated alkaline phosphatase. She is taking narcotic pain medication with some relief. She is not nor has she been a drinker, she has hyperlipidemia on Lipitor but does not recall ever having triglyceride levels above 500,000. She has no prior history of pancreatitis. She's had some nausea as mentioned but no vomiting. She's gotten to the point where she cannot tolerate the day in and out pain. 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  No Known Allergies Outpatient Prescriptions Prior to Visit  Medication Sig Dispense Refill  . aspirin EC 81 MG tablet Take 81 mg by mouth at bedtime.      Marland Kitchen atorvastatin (LIPITOR) 40 MG tablet Take 40 mg by mouth at bedtime.      . Biotin 5000 MCG TABS Take 5,000 mcg by mouth at bedtime.      . Omega-3 Fatty Acids (FISH OIL PO) Take 1 tablet by mouth at bedtime.      . ondansetron (ZOFRAN ODT) 8 MG disintegrating tablet Take 1 tablet (8 mg total) by mouth every 8 (eight) hours as needed for nausea or vomiting.  20 tablet  0  . oxyCODONE-acetaminophen (PERCOCET) 7.5-325 MG per tablet Take 1 tablet by mouth every 4 (four) hours as needed for pain.  20 tablet  0  . promethazine (PHENERGAN) 25 MG tablet Take 1 tablet (25 mg total) by mouth every 6 (six) hours as needed for nausea or  vomiting.  30 tablet  0   No facility-administered medications prior to visit.   Past Medical History  Diagnosis Date  . Pancreatitis 02/08/2013  . COPD (chronic obstructive pulmonary disease)    Past Surgical History  Procedure Laterality Date  . Appendectomy    . Knee cartilage surgery    . Abdominal hysterectomy  1995    tx endometriosis   History   Social History  . Marital Status: Legally Separated    Spouse Name: N/A    Number of Children: N/A  . Years of Education: N/A   Social History Main Topics  . Smoking status: Former Smoker -- 0.25 packs/day    Types: Cigarettes    Quit date: 02/12/2013  . Smokeless tobacco: Never Used  . Alcohol Use: No  . Drug Use: No  . Sexual Activity: None   Other Topics Concern  . None   Social History Narrative  . Married, 1 son and 1 daughter   Family History  Problem Relation Age of Onset  . Diabetes Father   . Diabetes Mother   . COPD Mother   . COPD Maternal Grandfather   . COPD Maternal Aunt   . COPD Maternal Uncle        Review of Systems Positive for headaches, O. other review of  systems negative    Objective:   Physical Exam General:  Well-developed, well-nourished and in no acute distress Eyes:  anicteric. ENT:   Mouth and posterior pharynx free of lesions.  Neck:   supple w/o thyromegaly or mass.  Lungs: Clear to auscultation bilaterally. Heart:  S1S2, no rubs, murmurs, gallops. Abdomen:  soft, mild-mod tender RUQ, no hepatosplenomegaly, hernia, or mass and BS+.  Lymph:  no cervical or supraclavicular adenopathy. Extremities:   no edema Skin   no rash., tattoo R posterior shoulder Neuro:  A&O x 3.  Psych:  appropriate mood and  Affect.   Data Reviewed: Ultrasound and CT scan from 02/08/2013, labs ER notes from same date       Assessment & Plan:   1. Pancreatic duct dilated   2. Abnormal serum lipase level   3. Abnormal alkaline phosphatase test   4. RUQ pain    The cause of these problems  is not clear. She appears to have pancreatitis though not by CT, bilateral clinical scenario. There is no mass lesion to explain it. She could have an ampullary lesion, she could have a very small pancreatic lesion as well. She could have papillary stenosis.  Pyloric last records from her primary care to make sure her triglycerides are not markedly elevated though that seems unlikely given the scenario. I have refilled her narcotic pain medication. Low fat diet is recommended Amylase, lipase, LFTs to be checked today. Endoscopic ultrasound seems to make sense for the next step in the diagnostic evaluation, I have discussed with Dr. Ardis Hughs our endosonographer and he concurs. I've explained the risks benefits and indications of the procedure including possible fine-needle aspiration or biopsy.  I appreciate the opportunity to care for this patient.

## 2013-02-16 NOTE — Discharge Instructions (Signed)
YOU HAD AN ENDOSCOPIC PROCEDURE TODAY: Refer to the procedure report that was given to you for any specific questions about what was found during the examination.  If the procedure report does not answer your questions, please call your gastroenterologist to clarify. ° °YOU SHOULD EXPECT: Some feelings of bloating in the abdomen. Passage of more gas than usual.  Walking can help get rid of the air that was put into your GI tract during the procedure and reduce the bloating. If you had a lower endoscopy (such as a colonoscopy or flexible sigmoidoscopy) you may notice spotting of blood in your stool or on the toilet paper.  ° °DIET: Your first meal following the procedure should be a light meal and then it is ok to progress to your normal diet.  A half-sandwich or bowl of soup is an example of a good first meal.  Heavy or fried foods are harder to digest and may make you feel nasueas or bloated.  Drink plenty of fluids but you should avoid alcoholic beverages for 24 hours. ° °ACTIVITY: Your care partner should take you home directly after the procedure.  You should plan to take it easy, moving slowly for the rest of the day.  You can resume normal activity the day after the procedure however you should NOT DRIVE or use heavy machinery for 24 hours (because of the sedation medicines used during the test).   ° °SYMPTOMS TO REPORT IMMEDIATELY  °A gastroenterologist can be reached at any hour.  Please call your doctor's office for any of the following symptoms: ° °· Following lower endoscopy (colonoscopy, flexible sigmoidoscopy) ° Excessive amounts of blood in the stool ° Significant tenderness, worsening of abdominal pains ° Swelling of the abdomen that is new, acute ° Fever of 100° or higher °· Following upper endoscopy (EGD, EUS, ERCP) ° Vomiting of blood or coffee ground material ° New, significant abdominal pain ° New, significant chest pain or pain under the shoulder blades ° Painful or persistently difficult  swallowing ° New shortness of breath ° Black, tarry-looking stools ° °FOLLOW UP: °If any biopsies were taken you will be contacted by phone or by letter within the next 1-3 weeks.  Call your gastroenterologist if you have not heard about the biopsies in 3 weeks.  °Please also call your gastroenterologist's office with any specific questions about appointments or follow up tests. °Gastrointestinal Endoscopy °Care After °Refer to this sheet in the next few weeks. These instructions provide you with information on caring for yourself after your procedure. Your caregiver may also give you more specific instructions. Your treatment has been planned according to current medical practices, but problems sometimes occur. Call your caregiver if you have any problems or questions after your procedure. °HOME CARE INSTRUCTIONS °· If you were given medicine to help you relax (sedative), do not drive, operate machinery, or sign important documents for 24 hours. °· Avoid alcohol and hot or warm beverages for the first 24 hours after the procedure. °· Only take over-the-counter or prescription medicines for pain, discomfort, or fever as directed by your caregiver. You may resume taking your normal medicines unless your caregiver tells you otherwise. Ask your caregiver when you may resume taking medicines that may cause bleeding, such as aspirin, clopidogrel, or warfarin. °· You may return to your normal diet and activities on the day after your procedure, or as directed by your caregiver. Walking may help to reduce any bloated feeling in your abdomen. °· Drink enough fluids to   keep your urine clear or pale yellow.  You may gargle with salt water if you have a sore throat. SEEK IMMEDIATE MEDICAL CARE IF:  You have severe nausea or vomiting.  You have severe abdominal pain, abdominal cramps that last longer than 6 hours, or abdominal swelling (distention).  You have severe shoulder or back pain.  You have trouble  swallowing.  You have shortness of breath, your breathing is shallow, or you are breathing faster than normal.  You have a fever or a rapid heartbeat.  You vomit blood or material that looks like coffee grounds.  You have bloody, black, or tarry stools. MAKE SURE YOU:  Understand these instructions.  Will watch your condition.  Will get help right away if you are not doing well or get worse. Document Released: 09/02/2003 Document Revised: 07/20/2011 Document Reviewed: 04/20/2011 [facility] Patient Information 2014 [place], [place]. Conscious Sedation, Adult, Care After Refer to this sheet in the next few weeks. These instructions provide you with information on caring for yourself after your procedure. Your health care provider may also give you more specific instructions. Your treatment has been planned according to current medical practices, but problems sometimes occur. Call your health care provider if you have any problems or questions after your procedure. WHAT TO EXPECT AFTER THE PROCEDURE  After your procedure:  You may feel sleepy, clumsy, and have poor balance for several hours.  Vomiting may occur if you eat too soon after the procedure. HOME CARE INSTRUCTIONS  Do not participate in any activities where you could become injured for at least 24 hours. Do not:  Drive.  Swim.  Ride a bicycle.  Operate heavy machinery.  Cook.  Use power tools.  Climb ladders.  Work from a high place.  Do not make important decisions or sign legal documents until you are improved.  If you vomit, drink water, juice, or soup when you can drink without vomiting. Make sure you have little or no nausea before eating solid foods.  Only take over-the-counter or prescription medicines for pain, discomfort, or fever as directed by your health care provider.  Make sure you and your family fully understand everything about the medicines given to you, including what side effects may  occur.  You should not drink alcohol, take sleeping pills, or take medicines that cause drowsiness for at least 24 hours.  If you smoke, do not smoke without supervision.  If you are feeling better, you may resume normal activities 24 hours after you were sedated.  Keep all appointments with your health care provider. SEEK MEDICAL CARE IF:  Your skin is pale or bluish in color.  You continue to feel nauseous or vomit.  Your pain is getting worse and is not helped by medicine.  You have bleeding or swelling.  You are still sleepy or feeling clumsy after 24 hours. SEEK IMMEDIATE MEDICAL CARE IF:  You develop a rash.  You have difficulty breathing.  You develop any type of allergic problem.  You have a fever. MAKE SURE YOU:  Understand these instructions.  Will watch your condition.  Will get help right away if you are not doing well or get worse. Document Released: 11/08/2012 Document Reviewed: 08/25/2012 [facility] Patient Information 2014 [place], [place].

## 2013-02-16 NOTE — Op Note (Signed)
[facility], [address]   ENDOSCOPIC ULTRASOUND PROCEDURE REPORT  PATIENT: Julie Nicholson, Julie Nicholson.  MR#: [id number] BIRTHDATE: 03-21-1965  GENDER: Female ENDOSCOPIST: Milus Banister, MD REFERRED BY:  Gatha Mayer, M.D, [facility] PROCEDURE DATE:  02/16/2013 PROCEDURE:   Upper EUS, EGD wtih biopsy ASA CLASS:      Class II INDICATIONS:   abdominal pain, bloating, nausea; lipase was elevated (100), CT scan suggested slightly dilated main pancreatic duct; GB without stones on CT and [place]. MEDICATIONS: MAC sedation, administered by CRNA  DESCRIPTION OF PROCEDURE:   After the risks benefits and alternatives of the procedure were  explained, informed consent was obtained. The patient was then placed in the left, lateral, decubitus postion and IV sedation was administered. Throughout the procedure, the patients blood pressure, pulse and oxygen saturations were monitored continuously.  Under direct visualization, the EUS scope [id number]  endoscope was introduced through the mouth  and advanced to the second portion of the duodenum .  Water was used as necessary to provide an acoustic interface.  Upon completion of the imaging, water was removed and the patient was sent to the recovery room in satisfactory condition.  Endoscopic findings (with radial echoendoscope, standard side viewing duodenoscope): 1. There was mild, non-specific distal gastritis. The was biopsied and sent to pathology. 2. UGI tract was otherwise normal including good evaluation of the major papilla  EUS findings: 1. The main pancreatic duct was normal appearing; measured 3.72mm in head, tapering smoothly and normally into the tail of pancreas. 2. CBD was normal, non-dilated and contained no stones. 3. Pancreatic parenchyma was normal; no discrete masses or signs of chronic pancreatitis 4. No peripancreatic adenopathy. 5. Gallbladder was normal.  Impression: Pancreatic duct,  pancreatic parenchyma was normal. There was mild distal gastritis, biopsied and sent to pathology. If H. pylori noted in biopsies then will start appropriate antibiotics.  If biopsies negative for H. pylori, could consider further biliary workup (HIDA scan to check for biliary dyskinesia?).    _______________________________ eSigned:  Milus Banister, MD 02/16/2013 8:20 AM   PATIENT NAME:  Julie Nicholson. MR#: [id number]

## 2013-02-16 NOTE — Anesthesia Postprocedure Evaluation (Signed)
  Anesthesia Post-op Note  Patient: Julie Nicholson  Procedure(s) Performed: Procedure(s) (LRB): UPPER ENDOSCOPIC ULTRASOUND (EUS) LINEAR (N/A)  Patient Location: PACU  Anesthesia Type: MAC  Level of Consciousness: awake and alert   Airway and Oxygen Therapy: Patient Spontanous Breathing  Post-op Pain: mild  Post-op Assessment: Post-op Vital signs reviewed, Patient's Cardiovascular Status Stable, Respiratory Function Stable, Patent Airway and No signs of Nausea or vomiting  Last Vitals:  Filed Vitals:   02/16/13 0853  BP: 116/68  Temp:   Resp: 13    Post-op Vital Signs: stable   Complications: No apparent anesthesia complications

## 2013-02-16 NOTE — Interval H&P Note (Signed)
History and Physical Interval Note:  02/16/2013 7:35 AM  Julie Nicholson  has presented today for surgery, with the diagnosis of Dilated pancreatic duct [577.8]  The various methods of treatment have been discussed with the patient and family. After consideration of risks, benefits and other options for treatment, the patient has consented to  Procedure(s) with comments: UPPER ENDOSCOPIC ULTRASOUND (EUS) LINEAR (N/A) - radial linear as a surgical intervention .  The patient's history has been reviewed, patient examined, no change in status, stable for surgery.  I have reviewed the patient's chart and labs.  Questions were answered to the patient's satisfaction.     Milus Banister

## 2013-02-16 NOTE — Preoperative (Signed)
Beta Blockers   Reason not to administer Beta [id number]:HAF Applicable

## 2013-02-16 NOTE — Anesthesia Preprocedure Evaluation (Addendum)
Anesthesia Evaluation  Patient identified by MRN, date of birth, ID band Patient awake    Reviewed: Allergy & Precautions, H&P , NPO status , Patient's Chart, lab work & pertinent test results  Airway Mallampati: II TM Distance: >3 FB Neck ROM: Full    Dental no notable dental hx.    Pulmonary COPDformer smoker,  breath sounds clear to auscultation  Pulmonary exam normal       Cardiovascular negative cardio ROS  Rhythm:Regular Rate:Normal     Neuro/Psych negative neurological ROS  negative psych ROS   GI/Hepatic negative GI ROS, Neg liver ROS,   Endo/Other  negative endocrine ROS  Renal/GU negative Renal ROS  negative genitourinary   Musculoskeletal negative musculoskeletal ROS (+)   Abdominal   Peds negative pediatric ROS (+)  Hematology negative hematology ROS (+)   Anesthesia Other Findings   Reproductive/Obstetrics negative OB ROS                          Anesthesia Physical Anesthesia Plan  ASA: II  Anesthesia Plan: MAC   Post-op Pain Management:    Induction:   Airway Management Planned: Natural Airway  Additional Equipment:   Intra-op Plan:   Post-operative Plan:   Informed Consent: I have reviewed the patients History and Physical, chart, labs and discussed the procedure including the risks, benefits and alternatives for the proposed anesthesia with the patient or authorized representative who has indicated his/her understanding and acceptance.   Dental advisory given  Plan Discussed with: CRNA  Anesthesia Plan Comments:         Anesthesia Quick Evaluation

## 2013-02-19 ENCOUNTER — Encounter (HOSPITAL_COMMUNITY): Payer: Self-pay | Admitting: Gastroenterology

## 2013-02-20 ENCOUNTER — Telehealth: Payer: Self-pay | Admitting: Gastroenterology

## 2013-02-20 DIAGNOSIS — R110 Nausea: Secondary | ICD-10-CM

## 2013-02-20 DIAGNOSIS — R1011 Right upper quadrant pain: Secondary | ICD-10-CM

## 2013-02-20 NOTE — Telephone Encounter (Signed)
Patient agreed to schedule HIDA as recommended by Dr. Carlean Purl and Dr. Ardis Hughs.  Patient is scheduled for HIDA 03/02/13 7:15 arrival at [facility] for 7:30.  She is advised to register in radiology and be NPO after midnight

## 2013-02-20 NOTE — Progress Notes (Signed)
Quick Note:  I had Julie Nicholson checking up on her as we speak ______

## 2013-02-20 NOTE — Telephone Encounter (Signed)
Pain is better, she still has nausea.  Wants to know if she can have colon done.  She says she feels like she gets "impacted".

## 2013-02-23 ENCOUNTER — Telehealth: Payer: Self-pay | Admitting: Internal Medicine

## 2013-02-23 NOTE — Telephone Encounter (Signed)
Patient had right upper quadrant pain last night with nausea followed by diarrhea.  She is feeling better now.  She is advised that she will need to keep her appt for [facility] HIDA and remain on a low fat diet.  She will call back for any additional questions or concerns

## 2013-02-26 ENCOUNTER — Ambulatory Visit (HOSPITAL_COMMUNITY)
Admission: RE | Admit: 2013-02-26 | Discharge: 2013-02-26 | Disposition: A | Discharge status: Home / Self Care | Payer: BC Managed Care – PPO | Source: Ambulatory Visit | Attending: Internal Medicine | Admitting: Internal Medicine

## 2013-02-26 DIAGNOSIS — R110 Nausea: Secondary | ICD-10-CM | POA: Insufficient documentation

## 2013-02-26 DIAGNOSIS — R1011 Right upper quadrant pain: Secondary | ICD-10-CM | POA: Insufficient documentation

## 2013-02-26 MED ORDER — TECHNETIUM TC 99M MEBROFENIN IV KIT
5.5000 | PACK | Freq: Once | INTRAVENOUS | Status: AC | PRN
  Administered 2013-02-26: 6 via INTRAVENOUS

## 2013-02-26 NOTE — Progress Notes (Signed)
Quick Note:  Strong emptying of the gallbladder Needs f/u me to reassess and discuss as we do not have a clear cause of sxs Can refer to a surgeon if desired without that visit, though and let them talk to her about possible removal of gallbladder which is an option but she and surgeon have to agree ______

## 2013-02-27 ENCOUNTER — Telehealth: Payer: Self-pay | Admitting: Gastroenterology

## 2013-02-27 NOTE — Telephone Encounter (Signed)
See results on hepatobiliary scan for further details

## 2013-02-27 NOTE — Telephone Encounter (Signed)
Barbera Setters can you call her with results please.  Thank you.

## 2013-02-28 ENCOUNTER — Encounter: Payer: Self-pay | Admitting: Physician Assistant

## 2013-02-28 ENCOUNTER — Ambulatory Visit (INDEPENDENT_AMBULATORY_CARE_PROVIDER_SITE_OTHER): Payer: BC Managed Care – PPO | Admitting: Physician Assistant

## 2013-02-28 VITALS — BP 110/74 | HR 83 | Temp 98.3°F | Ht 61.0 in | Wt 124.5 lb

## 2013-02-28 DIAGNOSIS — Z0000 Encounter for general adult medical examination without abnormal findings: Secondary | ICD-10-CM

## 2013-02-28 DIAGNOSIS — R1011 Right upper quadrant pain: Secondary | ICD-10-CM

## 2013-02-28 DIAGNOSIS — Z1231 Encounter for screening mammogram for malignant neoplasm of breast: Secondary | ICD-10-CM

## 2013-02-28 NOTE — Progress Notes (Signed)
Patient ID: Julie Nicholson is a 48 y.o. female DOB: [phone] MRN: [id number]     HPI: patient is a 48 year old female who presents to the clinic to establish care. Patient reports generally in good health, history of hyperlipidemia well controlled with Atorvastatin. Has recently experienced right upper quadrant pain with an ED to in patient admission for RUQ pain. Reports follows with GI, states no definitive cause of pain. Percocet prescribed with mild relief of pain and Zofran for nausea. No other current concerns. Denies chest pain/palpitations, SOB, cough, fever/chills, change in bladder habits, bowels at baseline, no visual change or disturbance, no numbness or weakness. Would like referral to GYN.  Influenza vaccine: 2/11 Tetanus: uncertain  PAP: unknown Mammo: 2013   ROS: As stated in HPI. All other systems negative  Past Medical History  Diagnosis Date  . Pancreatitis 02/08/2013  . COPD (chronic obstructive pulmonary disease)     per 2012 chest xray  . Headache(784.0)     migraines   Family History  Problem Relation Age of Onset  . Diabetes Father   . Diabetes Mother   . COPD Mother   . COPD Maternal Grandfather   . COPD Maternal Aunt   . COPD Maternal Uncle    History   Social History  . Marital Status: Married    Spouse Name: N/A    Number of Children: N/A  . Years of Education: N/A   Social History Main Topics  . Smoking status: Former Smoker -- 0.25 packs/day    Types: Cigarettes    Quit date: 02/12/2013  . Smokeless tobacco: Never Used  . Alcohol Use: Yes     Comment: only alcohol use twice  per year  . Drug Use: No  . Sexual Activity: None   Other Topics Concern  . None   Social History Narrative  . None   Past Surgical History  Procedure Laterality Date  . Knee cartilage surgery Right 1980's  . Appendectomy  late 1990's  . Lapasropic surgery for endometriosis  yrs ago  . Abdominal hysterectomy  1995    tx endometriosis, both ovaries  removed  . Eus N/A 02/16/2013    Procedure: UPPER ENDOSCOPIC ULTRASOUND (EUS) LINEAR;  Surgeon: Milus Banister, MD;  Location: [facility] END[facility]COPY;  Service: Endoscopy;  Laterality: N/A;  radial linear    PE: CONSTITUTIONAL: Well developed, well nourished, pleasant, appears stated age, in NAD HEENT: normocephalic, atraumatic, bilateral ext/int canals normal. Bilateral TM's without injections, bulging, erythema. Nose normal, uvula midline, oropharynx clear and moist. EYES: PERRLA, bilateral EOM and conjunctiva normal NECK: FROM, supple, without thyromegaly or mass CARDIO: RRR, normal S1 and S2, distal pulses intact. PULM/CHEST CTA bilateral, no wheezes, rales or rhonchi. Non tender. ABD: appearance normal, soft, nontender. Normal bowel sounds x 4 quadrants. Unable to reproduce pain with palpation. GU: deferred to GYN MUSC: FROM U/LE bilateral LYMPH: no cervical, supraclavicular adenopathy NEURO: alert and oriented x 3, no cranial nerve deficit, motor strength and coordination NL. Negative romberg. Gait normal. SKIN: warm, dry, no rash or lesions noted. PSYCH: Mood and affect normal, speech normal.  Lab Results  Component Value Date   WBC 8.9 02/08/2013   HGB 14.2 02/08/2013   HCT 40.4 02/08/2013   PLT 333 02/08/2013   GLUCOSE 108* 02/08/2013   ALT 27 02/13/2013   AST 21 02/13/2013   NA 140 02/08/2013   K 3.6* 02/08/2013   CL 100 02/08/2013   CREATININE 0.69 02/08/2013   BUN 15  02/08/2013   CO2 25 02/08/2013     ASSESSMENT and PLAN   CPX/v70.0 - Patient has been counseled on age-appropriate routine health concerns for screening and prevention. These are reviewed and up-to-date. Immunizations are up-to-date or declined. Labs ordered and will be reviewed. CBC and LFT labs cancelled with recent resulted in last couple weeks.  Referral to GYN, HM mammogram ordered.   Patient to continue follow up with GI and possible surgeon for symptom resolution. Reviewed reports from GI, who suggest possible removal of  gallbladder.  No refills required today. Continue on current medications as prescribed.

## 2013-02-28 NOTE — Patient Instructions (Signed)
It was great meeting you today Julie Nicholson!  Labs have been ordered for you, when you report to lab please be fasting.   Health Maintenance, Female A healthy lifestyle and preventative care can promote health and wellness.  Maintain regular health, dental, and eye exams.  Eat a healthy diet. Foods like vegetables, fruits, whole grains, low-fat dairy products, and lean protein foods contain the nutrients you need without too many calories. Decrease your intake of foods high in solid fats, added sugars, and salt. Get information about a proper diet from your caregiver, if necessary.  Regular physical exercise is one of the most important things you can do for your health. Most adults should get at least 150 minutes of moderate-intensity exercise (any activity that increases your heart rate and causes you to sweat) each week. In addition, most adults need muscle-strengthening exercises on 2 or more days a week.   Maintain a healthy weight. The body mass index (BMI) is a screening tool to identify possible weight problems. It provides an estimate of body fat based on height and weight. Your caregiver can help determine your BMI, and can help you achieve or maintain a healthy weight. For adults 20 years and older:  A BMI below 18.5 is considered underweight.  A BMI of 18.5 to 24.9 is normal.  A BMI of 25 to 29.9 is considered overweight.  A BMI of 30 and above is considered obese.  Maintain normal blood lipids and cholesterol by exercising and minimizing your intake of saturated fat. Eat a balanced diet with plenty of fruits and vegetables. Blood tests for lipids and cholesterol should begin at age 30 and be repeated every 5 years. If your lipid or cholesterol levels are high, you are over 50, or you are a high risk for heart disease, you may need your cholesterol levels checked more frequently.Ongoing high lipid and cholesterol levels should be treated with medicines if diet and exercise are not  effective.  If you smoke, find out from your caregiver how to quit. If you do not use tobacco, do not start.  Lung cancer screening is recommended for adults aged 49 80 years who are at high risk for developing lung cancer because of a history of smoking. Yearly low-dose computed tomography (CT) is recommended for people who have at least a 30-pack-year history of smoking and are a current smoker or have quit within the past 15 years. A pack year of smoking is smoking an average of 1 pack of cigarettes a day for 1 year (for example: 1 pack a day for 30 years or 2 packs a day for 15 years). Yearly screening should continue until the smoker has stopped smoking for at least 15 years. Yearly screening should also be stopped for people who develop a health problem that would prevent them from having lung cancer treatment.  If you are pregnant, do not drink alcohol. If you are breastfeeding, be very cautious about drinking alcohol. If you are not pregnant and choose to drink alcohol, do not exceed 1 drink per day. One drink is considered to be 12 ounces (355 mL) of beer, 5 ounces (148 mL) of wine, or 1.5 ounces (44 mL) of liquor.  Avoid use of street drugs. Do not share needles with anyone. Ask for help if you need support or instructions about stopping the use of drugs.  High blood pressure causes heart disease and increases the risk of stroke. Blood pressure should be checked at least every 1 to  2 years. Ongoing high blood pressure should be treated with medicines, if weight loss and exercise are not effective.  If you are 46 to 50 years old, ask your caregiver if you should take aspirin to prevent strokes.  Diabetes screening involves taking a blood sample to check your fasting blood sugar level. This should be done once every 3 years, after age 65, if you are within normal weight and without risk factors for diabetes. Testing should be considered at a younger age or be carried out more frequently if you  are overweight and have at least 1 risk factor for diabetes.  Breast cancer screening is essential preventative care for women. You should practice "breast self-awareness." This means understanding the normal appearance and feel of your breasts and may include breast self-examination. Any changes detected, no matter how small, should be reported to a caregiver. Women in their 80s and 30s should have a clinical breast exam (CBE) by a caregiver as part of a regular health exam every 1 to 3 years. After age 59, women should have a CBE every year. Starting at age 57, women should consider having a mammogram (breast X-ray) every year. Women who have a family history of breast cancer should talk to their caregiver about genetic screening. Women at a high risk of breast cancer should talk to their caregiver about having an MRI and a mammogram every year.  Breast cancer gene (BRCA)-related cancer risk assessment is recommended for women who have family members with BRCA-related cancers. BRCA-related cancers include breast, ovarian, tubal, and peritoneal cancers. Having family members with these cancers may be associated with an increased risk for harmful changes (mutations) in the breast cancer genes BRCA1 and BRCA2. Results of the assessment will determine the need for genetic counseling and BRCA1 and BRCA2 testing.  The Pap test is a screening test for cervical cancer. Women should have a Pap test starting at age 28. Between ages 79 and 20, Pap tests should be repeated every 2 years. Beginning at age 23, you should have a Pap test every 3 years as long as the past 3 Pap tests have been normal. If you had a hysterectomy for a problem that was not cancer or a condition that could lead to cancer, then you no longer need Pap tests. If you are between ages 74 and 43, and you have had normal Pap tests going back 10 years, you no longer need Pap tests. If you have had past treatment for cervical cancer or a condition that  could lead to cancer, you need Pap tests and screening for cancer for at least 20 years after your treatment. If Pap tests have been discontinued, risk factors (such as a new sexual partner) need to be reassessed to determine if screening should be resumed. Some women have medical problems that increase the chance of getting cervical cancer. In these cases, your caregiver may recommend more frequent screening and Pap tests.  The human papillomavirus (HPV) test is an additional test that may be used for cervical cancer screening. The HPV test looks for the virus that can cause the cell changes on the cervix. The cells collected during the Pap test can be tested for HPV. The HPV test could be used to screen women aged 26 years and older, and should be used in women of any age who have unclear Pap test results. After the age of 3, women should have HPV testing at the same frequency as a Pap test.  Colorectal cancer  can be detected and often prevented. Most routine colorectal cancer screening begins at the age of 59 and continues through age 40. However, your caregiver may recommend screening at an earlier age if you have risk factors for colon cancer. On a yearly basis, your caregiver may provide home test kits to check for hidden blood in the stool. Use of a small camera at the end of a tube, to directly examine the colon (sigmoidoscopy or colonoscopy), can detect the earliest forms of colorectal cancer. Talk to your caregiver about this at age 22, when routine screening begins. Direct examination of the colon should be repeated every 5 to 10 years through age 17, unless early forms of pre-cancerous polyps or small growths are found.  Hepatitis C blood testing is recommended for all people born from [birth year] through 1967 and any individual with known risks for hepatitis C.  Practice safe sex. Use condoms and avoid high-risk sexual practices to reduce the spread of sexually transmitted infections (STIs). Sexually  active women aged 79 and younger should be checked for Chlamydia, which is a common sexually transmitted infection. Older women with new or multiple partners should also be tested for Chlamydia. Testing for other STIs is recommended if you are sexually active and at increased risk.  Osteoporosis is a disease in which the bones lose minerals and strength with aging. This can result in serious bone fractures. The risk of osteoporosis can be identified using a bone density scan. Women ages 69 and over and women at risk for fractures or osteoporosis should discuss screening with their caregivers. Ask your caregiver whether you should be taking a calcium supplement or vitamin D to reduce the rate of osteoporosis.  Menopause can be associated with physical symptoms and risks. Hormone replacement therapy is available to decrease symptoms and risks. You should talk to your caregiver about whether hormone replacement therapy is right for you.  Use sunscreen. Apply sunscreen liberally and repeatedly throughout the day. You should seek shade when your shadow is shorter than you. Protect yourself by wearing long sleeves, pants, a wide-brimmed hat, and sunglasses year round, whenever you are outdoors.  Notify your caregiver of new moles or changes in moles, especially if there is a change in shape or color. Also notify your caregiver if a mole is larger than the size of a pencil eraser.  Stay current with your immunizations. Document Released: 08/03/2010 Document Revised: 05/15/2012 Document Reviewed: 08/03/2010 [facility] Patient Information 2014 [place].

## 2013-02-28 NOTE — Progress Notes (Signed)
Pre visit review using our clinic review tool, if applicable. No additional management support is needed unless otherwise documented below in the visit note. 

## 2013-03-01 ENCOUNTER — Other Ambulatory Visit (INDEPENDENT_AMBULATORY_CARE_PROVIDER_SITE_OTHER): Payer: BC Managed Care – PPO

## 2013-03-01 DIAGNOSIS — Z0000 Encounter for general adult medical examination without abnormal findings: Secondary | ICD-10-CM

## 2013-03-01 LAB — URINALYSIS, ROUTINE W REFLEX MICROSCOPIC
BILIRUBIN URINE: NEGATIVE
Ketones, ur: NEGATIVE
Leukocytes, UA: NEGATIVE
Nitrite: NEGATIVE
Specific Gravity, Urine: 1.01 (ref 1.000–1.030)
TOTAL PROTEIN, URINE-UPE24: NEGATIVE
URINE GLUCOSE: NEGATIVE
Urobilinogen, UA: 0.2 (ref 0.0–1.0)
pH: 7 (ref 5.0–8.0)

## 2013-03-01 LAB — LIPID PANEL
CHOLESTEROL: 172 mg/dL (ref 0–200)
HDL: 39.1 mg/dL (ref >39.00)
TRIGLYCERIDES: 216 mg/dL — AB (ref 0.0–149.0)
Total CHOL/HDL Ratio: 4
VLDL: 43.2 mg/dL — ABNORMAL HIGH (ref 0.0–40.0)

## 2013-03-01 LAB — BASIC METABOLIC PANEL
BUN: 12 mg/dL (ref 6–23)
CALCIUM: 10 mg/dL (ref 8.4–10.5)
CHLORIDE: 104 meq/L (ref 96–112)
CO2: 29 meq/L (ref 19–32)
CREATININE: 0.7 mg/dL (ref 0.4–1.2)
GFR: 99.34 mL/min (ref >60.00)
Glucose, Bld: 87 mg/dL (ref 70–99)
Potassium: 4.5 mEq/L (ref 3.5–5.1)
Sodium: 140 mEq/L (ref 135–145)

## 2013-03-01 LAB — TSH: TSH: 0.99 u[IU]/mL (ref 0.35–5.50)

## 2013-03-01 LAB — LDL CHOLESTEROL, DIRECT: Direct LDL: 103.3 mg/dL

## 2013-03-02 ENCOUNTER — Ambulatory Visit (HOSPITAL_COMMUNITY): Payer: BC Managed Care – PPO

## 2013-03-02 ENCOUNTER — Telehealth: Payer: Self-pay

## 2013-03-02 ENCOUNTER — Encounter: Payer: Self-pay | Admitting: Physician Assistant

## 2013-03-02 NOTE — Telephone Encounter (Signed)
Message copied by Colin Benton on Fri Mar 02, 2013 10:49 AM ------      Message from: Kyra Leyland      Created: Fri Mar 02, 2013  8:23 AM       Please phone patient and inform her triglycerides slightly elevated, everything else is normal. Continue with Lipitor and balanced, healthy diet and exercise. ------

## 2013-03-04 ENCOUNTER — Encounter: Payer: Self-pay | Admitting: Physician Assistant

## 2013-03-05 ENCOUNTER — Encounter: Payer: Self-pay | Admitting: Physician Assistant

## 2013-03-06 ENCOUNTER — Ambulatory Visit (INDEPENDENT_AMBULATORY_CARE_PROVIDER_SITE_OTHER): Payer: BC Managed Care – PPO | Admitting: Internal Medicine

## 2013-03-06 ENCOUNTER — Encounter: Payer: Self-pay | Admitting: Internal Medicine

## 2013-03-06 VITALS — BP 102/64 | HR 80 | Ht 61.0 in | Wt 125.8 lb

## 2013-03-06 DIAGNOSIS — R1011 Right upper quadrant pain: Secondary | ICD-10-CM

## 2013-03-06 DIAGNOSIS — R12 Heartburn: Secondary | ICD-10-CM

## 2013-03-06 HISTORY — DX: Right upper quadrant pain: R10.11

## 2013-03-06 MED ORDER — OXYCODONE-ACETAMINOPHEN 7.5-325 MG PO TABS: 1.0000 | ORAL_TABLET | ORAL | Status: DC | PRN

## 2013-03-06 MED ORDER — LANSOPRAZOLE 15 MG PO CPDR
15.0000 mg | DELAYED_RELEASE_CAPSULE | Freq: Two times a day (BID) | ORAL | Status: DC
Start: 2013-03-06 — End: 2013-03-26

## 2013-03-06 NOTE — Assessment & Plan Note (Signed)
I explained to her that while its possible her pain is from her gallbladder, none of the studies we have so far showed that. I also explained that sometimes, the surgeon and the patient might agree to remove her gallbladder in this setting he may not notice a problem until the time of cholecystectomy. However, cholecystectomy we'll not guarantee pain relief, either. At this point, I don't see other workup to be done. We will refer her for a surgical consultation in their opinion regarding her problems and whether or not it is reasonable to proceed with surgical treatment. I have refilled her Percocet at this time, she is specifically indicated she doesn't want take meds chronically but she only has 6 left and she remains in pain is clearly tender so I think this is reasonable. Musculoskeletal pain is in the differential still, I cannot make a convincing diagnosis of that.  CC: Stacy Gardner, PA-C

## 2013-03-06 NOTE — Patient Instructions (Addendum)
Please stay on a low fat diet. Take Prevacid twice a day as written on the medication list.  Today we are giving you a written rx for Percocet to take to the pharmacy and get filled.  You have been scheduled for an appointment with Dr. Jackolyn Confer at [facility]. Your appointment is on 03/13/13 at 4:00pm. Please arrive at 3:30pm for registration. Make certain to bring a list of current medications, including any over the counter medications or vitamins. Also bring your co-pay if you have one as well as your insurance cards. [facility] is located at [address], [address]. Should you need to reschedule your appointment, please contact them at [phone].   I appreciate the opportunity to care for you.

## 2013-03-06 NOTE — Progress Notes (Signed)
    Subjective:    Patient ID: Julie Nicholson, female    DOB: April 23, 1965, 48 y.o.   MRN: [id number]  HPI patient is here for followup of right upper quadrant pain. She had a HIDA scan with ejection fraction that showed a markedly elevated ejection fraction she had pain when she was given a fatty meal. She had an endoscopic ultrasound that showed a normal pancreas. There was a question of a dilated pancreatic duct on cross-sectional imaging, CT. Tired of hurting - has pain most of the time I have been hurting for over a month "and nothing has been done". "I do not want to take medication for the pain all the time - I want it fixed" Had heartburn yesterday and today - taking Prevacid OTC  Medications, allergies, past medical history, past surgical history, family history and social history are reviewed and updated in the EMR. Review of Systems As above    Objective:   Physical Exam General:  NAD Eyes:   anicteric Abdomen:  soft and mild-mod tenderness RUQ but not ribs, BS+ - one time buyt not consistently pain worse with muscle tension  Data Reviewed:  As per history of present illness    Assessment & Plan:   1. RUQ pain   2. Heartburn

## 2013-03-13 ENCOUNTER — Encounter (INDEPENDENT_AMBULATORY_CARE_PROVIDER_SITE_OTHER): Payer: Self-pay | Admitting: General Surgery

## 2013-03-13 ENCOUNTER — Ambulatory Visit (INDEPENDENT_AMBULATORY_CARE_PROVIDER_SITE_OTHER): Payer: BC Managed Care – PPO | Admitting: General Surgery

## 2013-03-13 ENCOUNTER — Encounter: Payer: Self-pay | Admitting: Physician Assistant

## 2013-03-13 VITALS — BP 130/78 | HR 82 | Temp 97.7°F | Resp 16 | Ht 62.0 in | Wt 123.8 lb

## 2013-03-13 DIAGNOSIS — K811 Chronic cholecystitis: Secondary | ICD-10-CM

## 2013-03-13 HISTORY — DX: Chronic cholecystitis: K81.1

## 2013-03-13 NOTE — Progress Notes (Addendum)
Patient ID: Julie Nicholson, female   DOB: 16-Nov-1965, 48 y.o.   MRN: [id number]  Chief Complaint  Patient presents with  . Abdominal Pain    HPI Julie Nicholson is a 48 y.o. female.   Abdominal Pain Associated symptoms: diarrhea     She is referred by Dr. Carlean Purl because of epigastric pain and to discuss possible cholecystectomy for this.  She states she developed some significant nausea about a year ago. She then developed epigastric pain with radiation to the right upper quadrant and right scapular area. The pain is exacerbated by eating.  She had a severe episode one night and went to the emergency department. This demonstrated a elevation of her lipase and alkaline phosphatase. She was given a diagnosis of some mild pancreatitis. CT scan suggested slightly dilated pancreatic duct but no obvious mass.  Abdominal ultrasound demonstrates no gallstones or wall thickening. Common bile duct diameter is normal. Nuclear medicine hepatobiliary scan demonstrates a gallbladder ejection fraction of over 90% with normal being greater than 33%. She's tried proton pump inhibitors. No specific etiology has been discovered for her pain. She has a strong family history of gallbladder disease.  Past Medical History  Diagnosis Date  . Pancreatitis 02/08/2013  . COPD (chronic obstructive pulmonary disease)     per 2012 chest xray  . Headache(784.0)     migraines    Past Surgical History  Procedure Laterality Date  . Knee cartilage surgery Right 1980's  . Appendectomy  late 1990's  . Laparoscopic surgery for endometriosis  yrs ago  . Abdominal hysterectomy  1995    tx endometriosis, both ovaries removed  . Eus N/A 02/16/2013    Procedure: UPPER ENDOSCOPIC ULTRASOUND (EUS) LINEAR;  Surgeon: Milus Banister, MD;  Location: [facility];  Service: Endoscopy;  Laterality: N/A;  radial linear    Family History  Problem Relation Age of Onset  . Diabetes Father   . Diabetes Mother   . COPD Mother   . COPD  Maternal Grandfather   . COPD Maternal Aunt   . COPD Maternal Uncle     Social History History  Substance Use Topics  . Smoking status: Former Smoker -- 0.25 packs/day    Types: Cigarettes    Quit date: 02/12/2013  . Smokeless tobacco: Never Used  . Alcohol Use: Yes     Comment: only alcohol use twice  per year    No Known Allergies  Current Outpatient Prescriptions  Medication Sig Dispense Refill  . aspirin EC 81 MG tablet Take 81 mg by mouth at bedtime.      Marland Kitchen atorvastatin (LIPITOR) 40 MG tablet Take 40 mg by mouth at bedtime.      . Biotin 5000 MCG TABS Take 5,000 mcg by mouth at bedtime.      . lansoprazole (PREVACID) 15 MG capsule Take 1 capsule (15 mg total) by mouth 2 (two) times daily before a meal.      . Omega-3 Fatty Acids (FISH OIL PO) Take 1 tablet by mouth at bedtime.      . ondansetron (ZOFRAN-ODT) 8 MG disintegrating tablet Take 8 mg by mouth every 8 (eight) hours as needed for nausea or vomiting.      Marland Kitchen oxyCODONE-acetaminophen (PERCOCET) 7.5-325 MG per tablet Take 1 tablet by mouth every 4 (four) hours as needed for pain.  30 tablet  0   No current facility-administered medications for this visit.    Review of Systems Review of Systems  Constitutional: Negative.  HENT: Positive for trouble swallowing.   Respiratory: Negative.   Cardiovascular: Negative.   Gastrointestinal: Positive for abdominal pain and diarrhea.  Genitourinary: Negative.   Neurological: Negative.   Hematological: Negative.     Blood pressure 130/78, pulse 82, temperature 97.7 F (36.5 C), temperature source Oral, resp. rate 16, height 5\' 2"  (1.575 m), weight 123 lb 12.8 oz (56.155 kg).  Physical Exam Physical Exam  Constitutional: She appears well-developed and well-nourished. No distress.  HENT:  Head: Normocephalic and atraumatic.  Eyes: EOM are normal. No scleral icterus.  Cardiovascular: Regular rhythm.   Pulmonary/Chest: Effort normal and breath sounds normal.  Abdominal:  Soft. She exhibits no distension and no mass. There is tenderness (Mild in epigastric area). There is no guarding.  Musculoskeletal: Normal range of motion.  Neurological: She is alert.  Skin: Skin is warm and dry.  Psychiatric: She has a normal mood and affect. Her behavior is normal.    Data Reviewed Laboratory results. Ultrasound report.  CT scan report. Nuclear medicine report.  Dr. Celesta Aver notes.  Assessment    Epigastric and right upper quadrant pain with nausea. The pain is consistent with biliary colic although studies are normal. No known cause of the pancreatitis at this time. It resolved rapidly.     Plan    We discussed performing a laparoscopic cholecystectomy with cholangiogram. I told her there is no way of knowing whether this was going to help her symptoms are not. It may be diagnostic and not help her symptoms but then the gallbladder would be ruled out. There is a possibility it could be diagnostic and therapeutic. I explained to her that she would have to understand that it may not help her before she decided she wanted to proceed. She does seem to understand this and she would like to proceed with the cholecystectomy.  I have explained the procedure, risks, and aftercare of cholecystectomy.  Risks include but are not limited to bleeding, infection, wound problems, anesthesia, diarrhea, bile leak, injury to common bile duct/liver/intestine, failure of symptoms to improve.  She seems to understand and wants to proceed.         Buren Havey J 03/13/2013, 5:38 PM

## 2013-03-13 NOTE — Patient Instructions (Signed)
[facility] ______[facility] °LAPAROSCOPIC SURGERY: POST OP INSTRUCTIONS °Always review your discharge instruction sheet given to you by the facility where your surgery was performed. °IF YOU HAVE DISABILITY OR FAMILY LEAVE FORMS, YOU MUST BRING THEM TO THE OFFICE FOR PROCESSING.   °DO NOT GIVE THEM TO YOUR DOCTOR. ° °1. A prescription for pain medication may be given to you upon discharge.  Take your pain medication as prescribed, if needed.  If narcotic pain medicine is not needed, then you may take acetaminophen (Tylenol) or ibuprofen (Advil) as needed. °2. Take your usually prescribed medications unless otherwise directed. °3. If you need a refill on your pain medication, please contact your pharmacy.  They will contact our office to request authorization. Prescriptions will not be filled after 5pm or on week-ends. °4. You should follow a light diet the first few days after arrival home, such as soup and crackers, etc.  Be sure to include lots of fluids daily. °5. Most patients will experience some swelling and bruising in the area of the incisions.  Ice packs will help.  Swelling and bruising can take several days to resolve.  °6. It is common to experience some constipation if taking pain medication after surgery.  Increasing fluid intake and taking a stool softener (such as Colace) will usually help or prevent this problem from occurring.  A mild laxative (Milk of Magnesia or Miralax) should be taken according to package instructions if there are no bowel movements after 48 hours. °7. Unless discharge instructions indicate otherwise, you may remove your bandages 24-48 hours after surgery, and you may shower at that time.  You may have steri-strips (small skin tapes) in place directly over the incision.  These strips should be left on the skin for 7-10 days.  If your surgeon used skin glue on the incision, you may shower in 24 hours.  The glue will flake off over the next 2-3 weeks.  Any sutures or  staples will be removed at the office during your follow-up visit. °8. ACTIVITIES:  You may resume regular (light) daily activities beginning the next day--such as daily self-care, walking, climbing stairs--gradually increasing activities as tolerated.  You may have sexual intercourse when it is comfortable.  Refrain from any heavy lifting or straining until approved by your doctor. °a. You may drive when you are no longer taking prescription pain medication, you can comfortably wear a seatbelt, and you can safely maneuver your car and apply brakes. °b. RETURN TO WORK:  __________________________________________________________ °9. You should see your doctor in the office for a follow-up appointment approximately 2-3 weeks after your surgery.  Make sure that you call for this appointment within a day or two after you arrive home to insure a convenient appointment time. °10. OTHER INSTRUCTIONS: __________________________________________________________________________________________________________________________ __________________________________________________________________________________________________________________________ °WHEN TO CALL YOUR DOCTOR: °1. Fever over 101.0 °2. Inability to urinate °3. Continued bleeding from incision. °4. Increased pain, redness, or drainage from the incision. °5. Increasing abdominal pain ° °The clinic staff is available to answer your questions during regular business hours.  Please don’t hesitate to call and ask to speak to one of the nurses for clinical concerns.  If you have a medical emergency, go to the nearest emergency room or call 911.  A surgeon from [facility] is always on call at the hospital. °[address], [address], [place], [place]  [address] ? [address] [address], [place], [place]   [address] °[phone] ? [phone] ? FAX [phone] °Web site:   [email or web address] °

## 2013-03-22 ENCOUNTER — Telehealth: Payer: Self-pay | Admitting: Internal Medicine

## 2013-03-22 ENCOUNTER — Telehealth (INDEPENDENT_AMBULATORY_CARE_PROVIDER_SITE_OTHER): Payer: Self-pay

## 2013-03-22 DIAGNOSIS — R1011 Right upper quadrant pain: Secondary | ICD-10-CM

## 2013-03-22 MED ORDER — OXYCODONE-ACETAMINOPHEN 7.5-325 MG PO TABS: 1.0000 | ORAL_TABLET | ORAL | Status: DC | PRN

## 2013-03-22 NOTE — Telephone Encounter (Signed)
1 refill # 30 same strength as before

## 2013-03-22 NOTE — Telephone Encounter (Signed)
Patient advised she will come and pick up

## 2013-03-22 NOTE — Telephone Encounter (Signed)
Patient requesting Percocet refill until her surgery on 04/10/13.  Please advise

## 2013-03-22 NOTE — Telephone Encounter (Signed)
She will have to get a refill from the previously prescribing physician.

## 2013-03-22 NOTE — Telephone Encounter (Signed)
Pt contacted with instructions to call Dr. Carlean Purl to refill her pain medication.  Dr. Zella Richer will issue pain meds only following her surgery.

## 2013-03-22 NOTE — Telephone Encounter (Signed)
Patient states she is out of her percocet , asking for refill , her G.B surgery is scheduled for 04-10-13 please advise

## 2013-03-26 ENCOUNTER — Encounter (HOSPITAL_COMMUNITY): Payer: Self-pay | Admitting: Pharmacy Technician

## 2013-03-27 ENCOUNTER — Ambulatory Visit
Admission: RE | Admit: 2013-03-27 | Discharge: 2013-03-27 | Disposition: A | Discharge status: Home / Self Care | Payer: BC Managed Care – PPO | Source: Ambulatory Visit | Attending: Physician Assistant | Admitting: Physician Assistant

## 2013-03-27 ENCOUNTER — Ambulatory Visit: Payer: BC Managed Care – PPO

## 2013-03-27 DIAGNOSIS — Z0000 Encounter for general adult medical examination without abnormal findings: Secondary | ICD-10-CM

## 2013-03-27 DIAGNOSIS — Z1231 Encounter for screening mammogram for malignant neoplasm of breast: Secondary | ICD-10-CM

## 2013-03-28 LAB — HM MAMMOGRAPHY: HM Mammogram: NORMAL

## 2013-04-03 ENCOUNTER — Encounter (HOSPITAL_COMMUNITY): Payer: Self-pay

## 2013-04-03 ENCOUNTER — Encounter (HOSPITAL_COMMUNITY)
Admission: RE | Admit: 2013-04-03 | Discharge: 2013-04-03 | Disposition: A | Discharge status: Home / Self Care | Payer: BC Managed Care – PPO | Source: Ambulatory Visit | Attending: General Surgery | Admitting: General Surgery

## 2013-04-03 DIAGNOSIS — Z01818 Encounter for other preprocedural examination: Secondary | ICD-10-CM | POA: Insufficient documentation

## 2013-04-03 DIAGNOSIS — Z01812 Encounter for preprocedural laboratory examination: Secondary | ICD-10-CM | POA: Insufficient documentation

## 2013-04-03 HISTORY — DX: Unspecified abdominal pain: R10.9

## 2013-04-03 LAB — CBC WITH DIFFERENTIAL/PLATELET
BASOS ABS: 0 10*3/uL (ref 0.0–0.1)
Basophils Relative: 0 % (ref 0–1)
Eosinophils Absolute: 0.1 10*3/uL (ref 0.0–0.7)
Eosinophils Relative: 2 % (ref 0–5)
HEMATOCRIT: 39.4 % (ref 36.0–46.0)
Hemoglobin: 14.2 g/dL (ref 12.0–15.0)
LYMPHS PCT: 36 % (ref 12–46)
Lymphs Abs: 3.1 10*3/uL (ref 0.7–4.0)
MCH: 33.4 pg (ref 26.0–34.0)
MCHC: 36 g/dL (ref 30.0–36.0)
MCV: 92.7 fL (ref 78.0–100.0)
Monocytes Absolute: 0.7 10*3/uL (ref 0.1–1.0)
Monocytes Relative: 8 % (ref 3–12)
Neutro Abs: 4.6 10*3/uL (ref 1.7–7.7)
Neutrophils Relative %: 54 % (ref 43–77)
PLATELETS: 294 10*3/uL (ref 150–400)
RBC: 4.25 MIL/uL (ref 3.87–5.11)
RDW: 12.7 % (ref 11.5–15.5)
WBC: 8.5 10*3/uL (ref 4.0–10.5)

## 2013-04-03 LAB — COMPREHENSIVE METABOLIC PANEL
ALT: 38 U/L — AB (ref 0–35)
AST: 25 U/L (ref 0–37)
Albumin: 4 g/dL (ref 3.5–5.2)
Alkaline Phosphatase: 116 U/L (ref 39–117)
BILIRUBIN TOTAL: 0.6 mg/dL (ref 0.3–1.2)
BUN: 14 mg/dL (ref 6–23)
CHLORIDE: 102 meq/L (ref 96–112)
CO2: 27 meq/L (ref 19–32)
Calcium: 9.9 mg/dL (ref 8.4–10.5)
Creatinine, Ser: 0.72 mg/dL (ref 0.50–1.10)
GFR calc Af Amer: >90 mL/min (ref >90)
Glucose, Bld: 121 mg/dL — ABNORMAL HIGH (ref 70–99)
Potassium: 3.7 mEq/L (ref 3.7–5.3)
SODIUM: 143 meq/L (ref 137–147)
Total Protein: 7.8 g/dL (ref 6.0–8.3)

## 2013-04-03 LAB — PROTIME-INR
INR: 0.94 (ref 0.00–1.49)
Prothrombin Time: 12.4 seconds (ref 11.6–15.2)

## 2013-04-03 LAB — LIPASE, BLOOD: Lipase: 84 U/L — ABNORMAL HIGH (ref 11–59)

## 2013-04-03 MED ORDER — PROMETHAZINE HCL 25 MG/ML IJ SOLN: 6.2500 mg | INTRAMUSCULAR | Status: DC | PRN

## 2013-04-03 MED ORDER — MEPERIDINE HCL 25 MG/ML IJ SOLN: 6.2500 mg | INTRAMUSCULAR | Status: DC | PRN

## 2013-04-03 MED ORDER — LACTATED RINGERS IV SOLN: INTRAVENOUS | Status: DC

## 2013-04-03 NOTE — Pre-Procedure Instructions (Signed)
IFRAH VEST  04/03/2013   Your procedure is scheduled on:  04/10/13  Report to [facility]  2 * 3 at 530 AM.  Call this number if you have problems the morning of surgery: [phone]   Remember:   Do not eat food or drink liquids after midnight.   Take these medicines the morning of surgery with A SIP OF WATER: oxycodone,prevacid   Do not wear jewelry, make-up or nail polish.  Do not wear lotions, powders, or perfumes. You may wear deodorant.  Do not shave 48 hours prior to surgery. Men may shave face and neck.  Do not bring valuables to the hospital.  [facility] is not responsible                  for any belongings or valuables.               Contacts, dentures or bridgework may not be worn into surgery.  Leave suitcase in the car. After surgery it may be brought to your room.  For patients admitted to the hospital, discharge time is determined by your                treatment team.               Patients discharged the day of surgery will not be allowed to drive  home.  Name and phone number of your driver:  Special Instructions: Shower using CHG 2 nights before surgery and the night before surgery.  If you shower the day of surgery use CHG.  Use special wash - you have one bottle of CHG for all showers.  You should use approximately 1/3 of the bottle for each shower.   Please read over the following fact sheets that you were given: Pain Booklet, Coughing and Deep Breathing and Surgical Site Infection Prevention

## 2013-04-09 MED ORDER — CEFAZOLIN SODIUM-DEXTROSE 2-3 GM-% IV SOLR
2.0000 g | INTRAVENOUS | Status: AC
  Administered 2013-04-10: 2 g via INTRAVENOUS
  Filled 2013-04-09: qty 50

## 2013-04-10 ENCOUNTER — Encounter (HOSPITAL_COMMUNITY): Payer: BC Managed Care – PPO | Admitting: Anesthesiology

## 2013-04-10 ENCOUNTER — Ambulatory Visit (HOSPITAL_COMMUNITY): Payer: BC Managed Care – PPO

## 2013-04-10 ENCOUNTER — Ambulatory Visit (HOSPITAL_COMMUNITY)
Admission: RE | Admit: 2013-04-10 | Discharge: 2013-04-11 | Disposition: A | Discharge status: Home / Self Care | Payer: BC Managed Care – PPO | Source: Ambulatory Visit | Attending: General Surgery | Admitting: General Surgery

## 2013-04-10 ENCOUNTER — Other Ambulatory Visit (INDEPENDENT_AMBULATORY_CARE_PROVIDER_SITE_OTHER): Payer: Self-pay | Admitting: General Surgery

## 2013-04-10 ENCOUNTER — Ambulatory Visit (HOSPITAL_COMMUNITY): Payer: BC Managed Care – PPO | Admitting: Anesthesiology

## 2013-04-10 ENCOUNTER — Encounter (HOSPITAL_COMMUNITY)
Admission: RE | Disposition: A | Discharge status: Home / Self Care | Payer: Self-pay | Source: Ambulatory Visit | Attending: General Surgery

## 2013-04-10 ENCOUNTER — Encounter (HOSPITAL_COMMUNITY): Payer: Self-pay | Admitting: *Deleted

## 2013-04-10 DIAGNOSIS — Z7982 Long term (current) use of aspirin: Secondary | ICD-10-CM | POA: Insufficient documentation

## 2013-04-10 DIAGNOSIS — K811 Chronic cholecystitis: Secondary | ICD-10-CM

## 2013-04-10 DIAGNOSIS — Z23 Encounter for immunization: Secondary | ICD-10-CM | POA: Insufficient documentation

## 2013-04-10 DIAGNOSIS — Z87891 Personal history of nicotine dependence: Secondary | ICD-10-CM | POA: Insufficient documentation

## 2013-04-10 DIAGNOSIS — K859 Acute pancreatitis without necrosis or infection, unspecified: Secondary | ICD-10-CM | POA: Insufficient documentation

## 2013-04-10 DIAGNOSIS — K219 Gastro-esophageal reflux disease without esophagitis: Secondary | ICD-10-CM | POA: Insufficient documentation

## 2013-04-10 DIAGNOSIS — J449 Chronic obstructive pulmonary disease, unspecified: Secondary | ICD-10-CM | POA: Insufficient documentation

## 2013-04-10 DIAGNOSIS — G43909 Migraine, unspecified, not intractable, without status migrainosus: Secondary | ICD-10-CM | POA: Insufficient documentation

## 2013-04-10 DIAGNOSIS — J4489 Other specified chronic obstructive pulmonary disease: Secondary | ICD-10-CM | POA: Insufficient documentation

## 2013-04-10 HISTORY — DX: Pure hypercholesterolemia, unspecified: E78.00

## 2013-04-10 HISTORY — DX: Migraine, unspecified, not intractable, without status migrainosus: G43.909

## 2013-04-10 HISTORY — PX: CHOLECYSTECTOMY: SHX55

## 2013-04-10 SURGERY — LAPAROSCOPIC CHOLECYSTECTOMY WITH INTRAOPERATIVE CHOLANGIOGRAM
Anesthesia: General | Site: Abdomen

## 2013-04-10 SURGERY — LAPAROSCOPIC CHOLECYSTECTOMY WITH INTRAOPERATIVE CHOLANGIOGRAM
Anesthesia: General

## 2013-04-10 MED ORDER — OXYCODONE HCL 5 MG/5ML PO SOLN: 5.0000 mg | Freq: Once | ORAL | Status: DC | PRN

## 2013-04-10 MED ORDER — OXYCODONE HCL 5 MG PO TABS: 5.0000 mg | ORAL_TABLET | ORAL | Status: DC | PRN

## 2013-04-10 MED ORDER — SODIUM CHLORIDE 0.9 % IR SOLN
Status: DC | PRN
  Administered 2013-04-10: 1000 mL

## 2013-04-10 MED ORDER — PNEUMOCOCCAL VAC POLYVALENT 25 MCG/0.5ML IJ INJ
0.5000 mL | INJECTION | INTRAMUSCULAR | Status: AC
  Administered 2013-04-11: 0.5 mL via INTRAMUSCULAR
  Filled 2013-04-10: qty 0.5

## 2013-04-10 MED ORDER — MORPHINE SULFATE 2 MG/ML IJ SOLN
2.0000 mg | INTRAMUSCULAR | Status: DC | PRN
  Administered 2013-04-10 (×2): 4 mg via INTRAVENOUS
  Filled 2013-04-10 (×2): qty 2

## 2013-04-10 MED ORDER — KCL-LACTATED RINGERS-D5W 20 MEQ/L IV SOLN
INTRAVENOUS | Status: DC
Start: 2013-04-10 — End: 2013-04-11
  Administered 2013-04-10: 11:00:00 via INTRAVENOUS
  Filled 2013-04-10 (×5): qty 1000

## 2013-04-10 MED ORDER — ESMOLOL HCL 10 MG/ML IV SOLN
INTRAVENOUS | Status: AC
  Filled 2013-04-10: qty 10

## 2013-04-10 MED ORDER — LIDOCAINE HCL (CARDIAC) 20 MG/ML IV SOLN
INTRAVENOUS | Status: AC
  Filled 2013-04-10: qty 5

## 2013-04-10 MED ORDER — PHENYLEPHRINE 40 MCG/ML (10ML) SYRINGE FOR IV PUSH (FOR BLOOD PRESSURE SUPPORT)
PREFILLED_SYRINGE | INTRAVENOUS | Status: AC
  Filled 2013-04-10: qty 10

## 2013-04-10 MED ORDER — MORPHINE SULFATE 2 MG/ML IJ SOLN: 2.0000 mg | INTRAMUSCULAR | Status: DC | PRN

## 2013-04-10 MED ORDER — ROCURONIUM BROMIDE 100 MG/10ML IV SOLN
INTRAVENOUS | Status: DC | PRN
  Administered 2013-04-10: 40 mg via INTRAVENOUS

## 2013-04-10 MED ORDER — SUCCINYLCHOLINE CHLORIDE 20 MG/ML IJ SOLN
INTRAMUSCULAR | Status: AC
  Filled 2013-04-10: qty 1

## 2013-04-10 MED ORDER — MIDAZOLAM HCL 5 MG/5ML IJ SOLN
INTRAMUSCULAR | Status: DC | PRN
  Administered 2013-04-10: 2 mg via INTRAVENOUS

## 2013-04-10 MED ORDER — DEXAMETHASONE SODIUM PHOSPHATE 4 MG/ML IJ SOLN
INTRAMUSCULAR | Status: AC
  Filled 2013-04-10: qty 1

## 2013-04-10 MED ORDER — FENTANYL CITRATE 0.05 MG/ML IJ SOLN
INTRAMUSCULAR | Status: AC
  Filled 2013-04-10: qty 5

## 2013-04-10 MED ORDER — HYDROMORPHONE HCL PF 1 MG/ML IJ SOLN
0.2500 mg | INTRAMUSCULAR | Status: DC | PRN
  Administered 2013-04-10 (×2): 0.5 mg via INTRAVENOUS

## 2013-04-10 MED ORDER — HEPARIN SODIUM (PORCINE) 5000 UNIT/ML IJ SOLN
5000.0000 [IU] | Freq: Three times a day (TID) | INTRAMUSCULAR | Status: DC
  Administered 2013-04-11: 5000 [IU] via SUBCUTANEOUS
  Filled 2013-04-10 (×4): qty 1

## 2013-04-10 MED ORDER — LIDOCAINE HCL (CARDIAC) 20 MG/ML IV SOLN
INTRAVENOUS | Status: DC | PRN
  Administered 2013-04-10: 40 mg via INTRAVENOUS

## 2013-04-10 MED ORDER — 0.9 % SODIUM CHLORIDE (POUR BTL) OPTIME
TOPICAL | Status: DC | PRN
  Administered 2013-04-10: 1000 mL

## 2013-04-10 MED ORDER — ONDANSETRON HCL 4 MG/2ML IJ SOLN
INTRAMUSCULAR | Status: AC
  Filled 2013-04-10: qty 2

## 2013-04-10 MED ORDER — ONDANSETRON HCL 4 MG PO TABS
4.0000 mg | ORAL_TABLET | ORAL | Status: DC | PRN
  Administered 2013-04-11: 4 mg via ORAL
  Filled 2013-04-10 (×2): qty 1

## 2013-04-10 MED ORDER — OXYCODONE HCL 5 MG PO TABS: 5.0000 mg | ORAL_TABLET | Freq: Once | ORAL | Status: DC | PRN

## 2013-04-10 MED ORDER — DEXAMETHASONE SODIUM PHOSPHATE 4 MG/ML IJ SOLN
INTRAMUSCULAR | Status: DC | PRN
  Administered 2013-04-10: 8 mg via INTRAVENOUS

## 2013-04-10 MED ORDER — ACETAMINOPHEN 650 MG RE SUPP: 650.0000 mg | RECTAL | Status: DC | PRN

## 2013-04-10 MED ORDER — MIDAZOLAM HCL 2 MG/2ML IJ SOLN
INTRAMUSCULAR | Status: AC
  Filled 2013-04-10: qty 2

## 2013-04-10 MED ORDER — ACETAMINOPHEN 325 MG PO TABS: 650.0000 mg | ORAL_TABLET | ORAL | Status: DC | PRN

## 2013-04-10 MED ORDER — NEOSTIGMINE METHYLSULFATE 1 MG/ML IJ SOLN
INTRAMUSCULAR | Status: DC | PRN
  Administered 2013-04-10: 1 mg via INTRAVENOUS
  Administered 2013-04-10: 3 mg via INTRAVENOUS

## 2013-04-10 MED ORDER — SODIUM CHLORIDE 0.9 % IJ SOLN
INTRAMUSCULAR | Status: AC
  Filled 2013-04-10: qty 10

## 2013-04-10 MED ORDER — GLYCOPYRROLATE 0.2 MG/ML IJ SOLN
INTRAMUSCULAR | Status: AC
  Filled 2013-04-10: qty 2

## 2013-04-10 MED ORDER — ESMOLOL HCL 10 MG/ML IV SOLN
INTRAVENOUS | Status: DC | PRN
  Administered 2013-04-10 (×2): 10 mg via INTRAVENOUS

## 2013-04-10 MED ORDER — GLYCOPYRROLATE 0.2 MG/ML IJ SOLN
INTRAMUSCULAR | Status: DC | PRN
  Administered 2013-04-10: 0.4 mg via INTRAVENOUS

## 2013-04-10 MED ORDER — DEXTROSE IN LACTATED RINGERS 5 % IV SOLN
INTRAVENOUS | Status: DC
  Administered 2013-04-10 (×2): via INTRAVENOUS

## 2013-04-10 MED ORDER — ONDANSETRON HCL 4 MG/2ML IJ SOLN
4.0000 mg | Freq: Four times a day (QID) | INTRAMUSCULAR | Status: DC | PRN
  Filled 2013-04-10: qty 2

## 2013-04-10 MED ORDER — PROPOFOL 10 MG/ML IV BOLUS
INTRAVENOUS | Status: DC | PRN
  Administered 2013-04-10: 100 mg via INTRAVENOUS

## 2013-04-10 MED ORDER — ROCURONIUM BROMIDE 50 MG/5ML IV SOLN
INTRAVENOUS | Status: AC
  Filled 2013-04-10: qty 1

## 2013-04-10 MED ORDER — LACTATED RINGERS IV SOLN
INTRAVENOUS | Status: DC | PRN
  Administered 2013-04-10 (×2): via INTRAVENOUS

## 2013-04-10 MED ORDER — ONDANSETRON HCL 4 MG/2ML IJ SOLN
4.0000 mg | INTRAMUSCULAR | Status: DC | PRN
  Administered 2013-04-10: 4 mg via INTRAVENOUS
  Filled 2013-04-10: qty 2

## 2013-04-10 MED ORDER — ONDANSETRON HCL 4 MG/2ML IJ SOLN
INTRAMUSCULAR | Status: AC
  Administered 2013-04-10: 4 mg
  Filled 2013-04-10: qty 2

## 2013-04-10 MED ORDER — EPHEDRINE SULFATE 50 MG/ML IJ SOLN
INTRAMUSCULAR | Status: AC
  Filled 2013-04-10: qty 1

## 2013-04-10 MED ORDER — SODIUM CHLORIDE 0.9 % IJ SOLN: 3.0000 mL | INTRAMUSCULAR | Status: DC | PRN

## 2013-04-10 MED ORDER — KETOROLAC TROMETHAMINE 30 MG/ML IJ SOLN
INTRAMUSCULAR | Status: DC | PRN
  Administered 2013-04-10: 30 mg via INTRAVENOUS

## 2013-04-10 MED ORDER — FENTANYL CITRATE 0.05 MG/ML IJ SOLN
INTRAMUSCULAR | Status: DC | PRN
  Administered 2013-04-10 (×2): 50 ug via INTRAVENOUS
  Administered 2013-04-10: 100 ug via INTRAVENOUS
  Administered 2013-04-10 (×3): 50 ug via INTRAVENOUS

## 2013-04-10 MED ORDER — PROPOFOL 10 MG/ML IV BOLUS
INTRAVENOUS | Status: AC
  Filled 2013-04-10: qty 20

## 2013-04-10 MED ORDER — SODIUM CHLORIDE 0.9 % IV SOLN
INTRAVENOUS | Status: DC | PRN
  Administered 2013-04-10: 07:00:00

## 2013-04-10 MED ORDER — HYDROMORPHONE HCL PF 1 MG/ML IJ SOLN
INTRAMUSCULAR | Status: AC
  Filled 2013-04-10: qty 1

## 2013-04-10 MED ORDER — BUPIVACAINE-EPINEPHRINE PF 0.25-1:200000 % IJ SOLN
INTRAMUSCULAR | Status: AC
  Filled 2013-04-10: qty 30

## 2013-04-10 MED ORDER — OXYCODONE HCL 5 MG PO TABS
5.0000 mg | ORAL_TABLET | ORAL | Status: DC | PRN
  Administered 2013-04-10 – 2013-04-11 (×3): 10 mg via ORAL
  Filled 2013-04-10 (×3): qty 2

## 2013-04-10 MED ORDER — NEOSTIGMINE METHYLSULFATE 1 MG/ML IJ SOLN
INTRAMUSCULAR | Status: AC
  Filled 2013-04-10: qty 10

## 2013-04-10 MED ORDER — ONDANSETRON HCL 4 MG/2ML IJ SOLN
INTRAMUSCULAR | Status: DC | PRN
  Administered 2013-04-10 (×2): 4 mg via INTRAVENOUS

## 2013-04-10 MED ORDER — KETOROLAC TROMETHAMINE 30 MG/ML IJ SOLN
INTRAMUSCULAR | Status: AC
  Filled 2013-04-10: qty 1

## 2013-04-10 MED ORDER — BUPIVACAINE-EPINEPHRINE 0.25% -1:200000 IJ SOLN
INTRAMUSCULAR | Status: DC | PRN
  Administered 2013-04-10: 30 mL

## 2013-04-10 SURGICAL SUPPLY — 51 items
APL SKNCLS STERI-STRIP NONHPOA (GAUZE/BANDAGES/DRESSINGS) ×1
APPLIER CLIP 5 13 M/L LIGAMAX5 (MISCELLANEOUS) ×2
APR CLP MED LRG 5 ANG JAW (MISCELLANEOUS) ×1
BAG SPEC RTRVL LRG 6X4 10 (ENDOMECHANICALS) ×1
BENZOIN TINCTURE PRP APPL 2/3 (GAUZE/BANDAGES/DRESSINGS) ×2 IMPLANT
CANISTER SUCTION 2500CC (MISCELLANEOUS) ×2 IMPLANT
CHLORAPREP W/TINT 26ML (MISCELLANEOUS) ×2 IMPLANT
CLIP APPLIE 5 13 M/L LIGAMAX5 (MISCELLANEOUS) ×1 IMPLANT
COVER MAYO STAND STRL (DRAPES) ×2 IMPLANT
COVER SURGICAL LIGHT HANDLE (MISCELLANEOUS) ×2 IMPLANT
DECANTER SPIKE VIAL GLASS SM (MISCELLANEOUS) ×4 IMPLANT
DRAPE C-ARM 42X72 X-RAY (DRAPES) ×2 IMPLANT
DRAPE UTILITY 15X26 W/TAPE STR (DRAPE) ×4 IMPLANT
DRSG TEGADERM 2-3/8X2-3/4 SM (GAUZE/BANDAGES/DRESSINGS) ×5 IMPLANT
ELECT REM PT RETURN 9FT ADLT (ELECTROSURGICAL) ×2
ELECTRODE REM PT RTRN 9FT ADLT (ELECTROSURGICAL) ×1 IMPLANT
GAUZE SPONGE 2X2 8PLY STRL LF (GAUZE/BANDAGES/DRESSINGS) ×1 IMPLANT
GLOVE BIO SURGEON STRL SZ8 (GLOVE) ×1 IMPLANT
GLOVE BIOGEL PI IND STRL 6.5 (GLOVE) IMPLANT
GLOVE BIOGEL PI IND STRL 7.0 (GLOVE) IMPLANT
GLOVE BIOGEL PI IND STRL 7.5 (GLOVE) IMPLANT
GLOVE BIOGEL PI IND STRL 8 (GLOVE) ×1 IMPLANT
GLOVE BIOGEL PI INDICATOR 6.5 (GLOVE) ×1
GLOVE BIOGEL PI INDICATOR 7.0 (GLOVE) ×1
GLOVE BIOGEL PI INDICATOR 7.5 (GLOVE) ×1
GLOVE BIOGEL PI INDICATOR 8 (GLOVE) ×2
GLOVE ECLIPSE 6.5 STRL STRAW (GLOVE) ×1 IMPLANT
GLOVE ECLIPSE 7.5 STRL STRAW (GLOVE) ×1 IMPLANT
GLOVE ECLIPSE 8.0 STRL XLNG CF (GLOVE) ×2 IMPLANT
GLOVE SURG SS PI 7.0 STRL IVOR (GLOVE) ×1 IMPLANT
GOWN STRL REUS W/ TWL LRG LVL3 (GOWN DISPOSABLE) ×4 IMPLANT
GOWN STRL REUS W/TWL LRG LVL3 (GOWN DISPOSABLE) ×8
KIT BASIN OR (CUSTOM PROCEDURE TRAY) ×2 IMPLANT
KIT ROOM TURNOVER OR (KITS) ×2 IMPLANT
NS IRRIG 1000ML POUR BTL (IV SOLUTION) ×2 IMPLANT
PAD ARMBOARD 7.5X6 YLW CONV (MISCELLANEOUS) ×2 IMPLANT
POUCH SPECIMEN RETRIEVAL 10MM (ENDOMECHANICALS) ×2 IMPLANT
SCISSORS LAP 5X35 DISP (ENDOMECHANICALS) ×2 IMPLANT
SET CHOLANGIOGRAPH 5 50 .035 (SET/KITS/TRAYS/PACK) ×2 IMPLANT
SET IRRIG TUBING LAPAROSCOPIC (IRRIGATION / IRRIGATOR) ×3 IMPLANT
SLEEVE ENDOPATH XCEL 5M (ENDOMECHANICALS) ×4 IMPLANT
SPECIMEN JAR SMALL (MISCELLANEOUS) ×2 IMPLANT
SPONGE GAUZE 2X2 STER 10/PKG (GAUZE/BANDAGES/DRESSINGS) ×1
STRIP CLOSURE SKIN 1/2X4 (GAUZE/BANDAGES/DRESSINGS) ×1 IMPLANT
SUT MON AB 4-0 PC3 18 (SUTURE) ×2 IMPLANT
TOWEL OR 17X24 6PK STRL BLUE (TOWEL DISPOSABLE) ×2 IMPLANT
TOWEL OR 17X26 10 PK STRL BLUE (TOWEL DISPOSABLE) ×2 IMPLANT
TRAY LAPAROSCOPIC (CUSTOM PROCEDURE TRAY) ×2 IMPLANT
TROCAR XCEL BLUNT TIP 100MML (ENDOMECHANICALS) ×2 IMPLANT
TROCAR XCEL NON-BLD 11X100MML (ENDOMECHANICALS) IMPLANT
TROCAR XCEL NON-BLD 5MMX100MML (ENDOMECHANICALS) ×2 IMPLANT

## 2013-04-10 NOTE — Op Note (Signed)
Preoperative diagnosis:  Chronic cholecystitis  Postoperative diagnosis:  Same    Procedure: Laparoscopic cholecystectomy with cholangiogram.  Surgeon: Jackolyn Confer, M.D.  Asst.:  Neysa Bonito, M.D.  Anesthesia: General  Indication:   This is a 48 year old female with typical biliary colic type symptoms but with a normal gallbladder on [place] and Hepatobiliary scan with cck injection.  She has had other causes ruled out as well.  She has chosen to proceed with cholecystectomy since her symptoms are typical for gallbladder disease.  Technique: She was brought to the operating room, placed supine on the operating table, and a general anesthetic was administered. The abdominal wall was then sterilely prepped and draped. Local anesthetic (Marcaine) was infiltrated in the subumbilical region. A small subumbilical incision was made through the skin, subcutaneous tissue, fascia, and peritoneum entering the peritoneal cavity under direct vision. A pursestring suture of 0 Vicryl was placed around the edges of the fascia. A Hassan trocar was introduced into the peritoneal cavity and a pneumoperitoneum was created by insufflation of carbon dioxide gas. The laparoscope was introduced into the trocar and no underlying bleeding or organ injury was noted. She was then placed in the reverse Trendelenburg position with the right side tilted slightly up.  Three 5 mm trocars were then placed into the abdominal cavity under laparoscopic vision. One in the epigastric area, and 2 in the right upper quadrant area. The gallbladder was visualized and the fundus was grasped and retracted toward the right shoulder.  The infundibulum was mobilized with dissection close to the gallbladder and retracted laterally. The cystic duct was identified and a window was created around it. The cystic artery was also identified and a window was created around it. The critical view was achieved. A clip was placed at the neck of the  gallbladder. A small incision was made in the cystic duct. A cholangiocatheter was introduced through the anterior abdominal wall and placed in the cystic duct. A intraoperative cholangiogram was then performed.  Under real-time fluoroscopy, dilute contrast was injected into the cystic duct.  The common hepatic duct, the right and left hepatic ducts, and the common duct were all visualized. Contrast drained into the duodenum without obvious evidence of any obstructing ductal lesion. The final report is pending the Radiologist's interpretation.  The cholangiocatheter was removed, the cystic duct was clipped 3 times on the biliary side, and then the cystic duct was divided sharply. No bile leak was noted from the cystic duct stump.  The cystic artery was then clipped and divided. Following this the gallbladder was dissected free from the liver using electrocautery. The gallbladder was then placed in a retrieval bag and removed from the abdominal cavity through the subumbilical incision.  The gallbladder fossa was inspected, irrigated, and bleeding was controlled with electrocautery. Inspection showed that hemostasis was adequate and there was no evidence of bile leak.  The irrigation fluid was evacuated as much as possible.  The subumbilical trocar was removed and the fascial defect was closed by tightening and tying down the pursestring suture under laparoscopic vision.  The remaining trocars were removed and the pneumoperitoneum was released. The skin incisions were closed with 4-0 Monocryl subcuticular stitches. Steri-Strips and sterile dressings were applied.  The procedure was well-tolerated without any apparent complications. She was taken to the recovery room in satisfactory condition.

## 2013-04-10 NOTE — H&P (View-Only) (Signed)
Patient ID: Julie Nicholson, female   DOB: 1965/11/05, 48 y.o.   MRN: [id number]  Chief Complaint  Patient presents with  . Abdominal Pain    HPI Julie Nicholson is a 48 y.o. female.   Abdominal Pain Associated symptoms: diarrhea     She is referred by Dr. Carlean Purl because of epigastric pain and to discuss possible cholecystectomy for this.  She states she developed some significant nausea about a year ago. She then developed epigastric pain with radiation to the right upper quadrant and right scapular area. The pain is exacerbated by eating.  She had a severe episode one night and went to the emergency department. This demonstrated a elevation of her lipase and alkaline phosphatase. She was given a diagnosis of some mild pancreatitis. CT scan suggested slightly dilated pancreatic duct but no obvious mass.  Abdominal ultrasound demonstrates no gallstones or wall thickening. Common bile duct diameter is normal. Nuclear medicine hepatobiliary scan demonstrates a gallbladder ejection fraction of over 90% with normal being greater than 33%. She's tried proton pump inhibitors. No specific etiology has been discovered for her pain. She has a strong family history of gallbladder disease.  Past Medical History  Diagnosis Date  . Pancreatitis 02/08/2013  . COPD (chronic obstructive pulmonary disease)     per 2012 chest xray  . Headache(784.0)     migraines    Past Surgical History  Procedure Laterality Date  . Knee cartilage surgery Right 1980's  . Appendectomy  late 1990's  . Laparoscopic surgery for endometriosis  yrs ago  . Abdominal hysterectomy  1995    tx endometriosis, both ovaries removed  . Eus N/A 02/16/2013    Procedure: UPPER ENDOSCOPIC ULTRASOUND (EUS) LINEAR;  Surgeon: Milus Banister, MD;  Location: [facility];  Service: Endoscopy;  Laterality: N/A;  radial linear    Family History  Problem Relation Age of Onset  . Diabetes Father   . Diabetes Mother   . COPD Mother   . COPD  Maternal Grandfather   . COPD Maternal Aunt   . COPD Maternal Uncle     Social History History  Substance Use Topics  . Smoking status: Former Smoker -- 0.25 packs/day    Types: Cigarettes    Quit date: 02/12/2013  . Smokeless tobacco: Never Used  . Alcohol Use: Yes     Comment: only alcohol use twice  per year    No Known Allergies  Current Outpatient Prescriptions  Medication Sig Dispense Refill  . aspirin EC 81 MG tablet Take 81 mg by mouth at bedtime.      Marland Kitchen atorvastatin (LIPITOR) 40 MG tablet Take 40 mg by mouth at bedtime.      . Biotin 5000 MCG TABS Take 5,000 mcg by mouth at bedtime.      . lansoprazole (PREVACID) 15 MG capsule Take 1 capsule (15 mg total) by mouth 2 (two) times daily before a meal.      . Omega-3 Fatty Acids (FISH OIL PO) Take 1 tablet by mouth at bedtime.      . ondansetron (ZOFRAN-ODT) 8 MG disintegrating tablet Take 8 mg by mouth every 8 (eight) hours as needed for nausea or vomiting.      Marland Kitchen oxyCODONE-acetaminophen (PERCOCET) 7.5-325 MG per tablet Take 1 tablet by mouth every 4 (four) hours as needed for pain.  30 tablet  0   No current facility-administered medications for this visit.    Review of Systems Review of Systems  Constitutional: Negative.  HENT: Positive for trouble swallowing.   Respiratory: Negative.   Cardiovascular: Negative.   Gastrointestinal: Positive for abdominal pain and diarrhea.  Genitourinary: Negative.   Neurological: Negative.   Hematological: Negative.     Blood pressure 130/78, pulse 82, temperature 97.7 F (36.5 C), temperature source Oral, resp. rate 16, height 5\' 2"  (1.575 m), weight 123 lb 12.8 oz (56.155 kg).  Physical Exam Physical Exam  Constitutional: She appears well-developed and well-nourished. No distress.  HENT:  Head: Normocephalic and atraumatic.  Eyes: EOM are normal. No scleral icterus.  Cardiovascular: Regular rhythm.   Pulmonary/Chest: Effort normal and breath sounds normal.  Abdominal:  Soft. She exhibits no distension and no mass. There is tenderness (Mild in epigastric area). There is no guarding.  Musculoskeletal: Normal range of motion.  Neurological: She is alert.  Skin: Skin is warm and dry.  Psychiatric: She has a normal mood and affect. Her behavior is normal.    Data Reviewed Laboratory results. Ultrasound report.  CT scan report. Nuclear medicine report.  Dr. Celesta Aver notes.  Assessment    Epigastric and right upper quadrant pain with nausea. The pain is consistent with biliary colic although studies are normal. No known cause of the pancreatitis at this time. It resolved rapidly.     Plan    We discussed performing a laparoscopic cholecystectomy with cholangiogram. I told her there is no way of knowing whether this was going to help her symptoms are not. It may be diagnostic and not help her symptoms but then the gallbladder would be ruled out. There is a possibility it could be diagnostic and therapeutic. I explained to her that she would have to understand that it may not help her before she decided she wanted to proceed. She does seem to understand this and she would like to proceed with the cholecystectomy.  I have explained the procedure, risks, and aftercare of cholecystectomy.  Risks include but are not limited to bleeding, infection, wound problems, anesthesia, diarrhea, bile leak, injury to common bile duct/liver/intestine, failure of symptoms to improve.  She seems to understand and wants to proceed.         Georgie Eduardo J 03/13/2013, 5:38 PM

## 2013-04-10 NOTE — Interval H&P Note (Signed)
History and Physical Interval Note:  04/10/2013 7:28 AM  Julie Nicholson  has presented today for surgery, with the diagnosis of chronic cholecystis   The various methods of treatment have been discussed with the patient and family. After consideration of risks, benefits and other options for treatment, the patient has consented to  Procedure(s): LAPAROSCOPIC CHOLECYSTECTOMY WITH INTRAOPERATIVE CHOLANGIOGRAM (N/A) as a surgical intervention .  The patient's history has been reviewed, patient examined, no change in status, stable for surgery.  I have reviewed the patient's chart and labs.  Questions were answered to the patient's satisfaction.     Victoriah Wilds Lenna Sciara

## 2013-04-10 NOTE — Progress Notes (Signed)
Pt states that the pain is getting better and the nausea is gone but that she is afraid to go home. Dr. Zella Richer notified and states that he will be by shortly.

## 2013-04-10 NOTE — Transfer of Care (Signed)
Immediate Anesthesia Transfer of Care Note  Patient: Julie Nicholson  Procedure(s) Performed: Procedure(s): LAPAROSCOPIC CHOLECYSTECTOMY WITH INTRAOPERATIVE CHOLANGIOGRAM (N/A)  Patient Location: PACU  Anesthesia Type:General  Level of Consciousness: awake, alert , oriented and sedated  Airway & Oxygen Therapy: Patient Spontanous Breathing and Patient connected to nasal cannula oxygen  Post-op Assessment: Report given to PACU RN, Post -op Vital signs reviewed and stable and Patient moving all extremities  Post vital signs: Reviewed and stable  Complications: No apparent anesthesia complications

## 2013-04-10 NOTE — Progress Notes (Signed)
Pt. Complaints of pain 7/10 and feeling very nauseous.  IV morphine and zofran given.  Will continue to monitor. Syliva Overman

## 2013-04-10 NOTE — Anesthesia Procedure Notes (Signed)
Procedure Name: Intubation Date/Time: 04/10/2013 7:39 AM Performed by: Julian Reil Pre-anesthesia Checklist: Patient identified, Emergency Drugs available, Suction available and Patient being monitored Patient Re-evaluated:Patient Re-evaluated prior to inductionOxygen Delivery Method: Circle system utilized Preoxygenation: Pre-oxygenation with 100% oxygen Intubation Type: IV induction Ventilation: Mask ventilation without difficulty Laryngoscope Size: Mac and 3 Grade View: Grade II Tube type: Oral Tube size: 7.0 mm Number of attempts: 1 Airway Equipment and Method: Stylet Placement Confirmation: ETT inserted through vocal cords under direct vision,  positive ETCO2 and breath sounds checked- equal and bilateral Secured at: 20 cm Tube secured with: Tape Dental Injury: Teeth and Oropharynx as per pre-operative assessment

## 2013-04-10 NOTE — Anesthesia Preprocedure Evaluation (Addendum)
Anesthesia Evaluation  Patient identified by MRN, date of birth, ID band Patient awake    Reviewed: Allergy & Precautions, H&P , NPO status , Patient's Chart, lab work & pertinent test results  Airway Mallampati: III TM Distance: >3 FB Neck ROM: Full    Dental no notable dental hx. (+) Teeth Intact, Dental Advisory Given   Pulmonary COPDformer smoker,  breath sounds clear to auscultation  Pulmonary exam normal       Cardiovascular negative cardio ROS  Rhythm:Regular Rate:Normal     Neuro/Psych  Headaches, negative psych ROS   GI/Hepatic Neg liver ROS, GERD-  Medicated and Controlled,  Endo/Other  negative endocrine ROS  Renal/GU negative Renal ROS  negative genitourinary   Musculoskeletal   Abdominal   Peds  Hematology negative hematology ROS (+)   Anesthesia Other Findings   Reproductive/Obstetrics negative OB ROS                          Anesthesia Physical Anesthesia Plan  ASA: II  Anesthesia Plan: General   Post-op Pain Management:    Induction: Intravenous  Airway Management Planned: Oral ETT  Additional Equipment:   Intra-op Plan:   Post-operative Plan: Extubation in OR  Informed Consent: I have reviewed the patients History and Physical, chart, labs and discussed the procedure including the risks, benefits and alternatives for the proposed anesthesia with the patient or authorized representative who has indicated his/her understanding and acceptance.   Dental advisory given  Plan Discussed with: CRNA  Anesthesia Plan Comments:         Anesthesia Quick Evaluation

## 2013-04-10 NOTE — Preoperative (Signed)
Beta Blockers   Reason not to administer Beta Blockers:Not Applicable 

## 2013-04-10 NOTE — Discharge Instructions (Signed)
[facility] ______[facility] LAPAROSCOPIC SURGERY: POST OP INSTRUCTIONS Always review your discharge instruction sheet given to you by the facility where your surgery was performed. IF YOU HAVE DISABILITY OR FAMILY LEAVE FORMS, YOU MUST BRING THEM TO THE OFFICE FOR PROCESSING.   DO NOT GIVE THEM TO YOUR DOCTOR.  1. A prescription for pain medication may be given to you upon discharge.  Take your pain medication as prescribed, if needed.  If narcotic pain medicine is not needed, then you may take acetaminophen (Tylenol) or ibuprofen (Advil) as needed. 2. Take your usually prescribed medications unless otherwise directed. 3. If you need a refill on your pain medication, please contact your pharmacy.  They will contact our office to request authorization. Prescriptions will not be filled after 5pm or on week-ends. 4. You should follow a light diet the first two days after arrival home, such as soup and crackers, etc.  Be sure to include lots of fluids daily.  Lowfat diet after that. 5. Most patients will experience some swelling and bruising in the area of the incisions.  Ice packs will help.  Swelling and bruising can take several days to resolve.  6. It is common to experience some constipation if taking pain medication after surgery.  Increasing fluid intake and taking a stool softener (such as Colace) will usually help or prevent this problem from occurring.  A mild laxative (Milk of Magnesia or Miralax) should be taken according to package instructions if there are no bowel movements after 48 hours. 7. Unless discharge instructions indicate otherwise, you may remove your bandages 72 hours after surgery, and you may shower at that time.  You may have steri-strips (small skin tapes) in place directly over the incision.  These strips should be left on the skin for 14 days.  If your surgeon used skin glue on the incision, you may shower in 24 hours.  The glue will flake off over the next 2-3  weeks.  Any sutures or staples will be removed at the office during your follow-up visit. 8. ACTIVITIES:  You may resume regular (light) daily activities beginning the next day--such as daily self-care, walking, climbing stairs--gradually increasing activities as tolerated.  You may have sexual intercourse when it is comfortable.  Refrain from any heavy lifting or straining-nothing over 10 pounds for 2 weeks.  a. You may drive when you are no longer taking prescription pain medication, you can comfortably wear a seatbelt, and you can safely maneuver your car and apply brakes. b. RETURN TO WORK:  Desk work/light work in 5-7 days, full duty in 2 weeks.__________________________________________________________ 9. You should see your doctor in the office for a follow-up appointment approximately 2-3 weeks after your surgery.  Make sure that you call for this appointment within a day or two after you arrive home to insure a convenient appointment time. 10. OTHER INSTRUCTIONS: __________________________________________________________________________________________________________________________ __________________________________________________________________________________________________________________________ WHEN TO CALL YOUR DOCTOR: 1. Fever over 101.0 2. Inability to urinate 3. Continued bleeding from incision. 4. Increased pain, redness, or drainage from the incision. 5. Increasing abdominal pain  The clinic staff is available to answer your questions during regular business hours.  Please dont hesitate to call and ask to speak to one of the nurses for clinical concerns.  If you have a medical emergency, go to the nearest emergency room or call 911.  A surgeon from [facility] is always on call at the hospital. [address] [place], [place], [place]  [address] ? [address], [place], [place]   [address] [phone] ? [phone] ? [phone] Web site:  [email or web address]

## 2013-04-10 NOTE — Anesthesia Postprocedure Evaluation (Signed)
  Anesthesia Post-op Note  Patient: Julie Nicholson  Procedure(s) Performed: Procedure(s): LAPAROSCOPIC CHOLECYSTECTOMY WITH INTRAOPERATIVE CHOLANGIOGRAM (N/A)  Patient Location: PACU  Anesthesia Type:General  Level of Consciousness: awake and alert   Airway and Oxygen Therapy: Patient Spontanous Breathing  Post-op Pain: moderate  Post-op Assessment: Post-op Vital signs reviewed, Patient's Cardiovascular Status Stable and Respiratory Function Stable  Post-op Vital Signs: Reviewed  Filed Vitals:   04/10/13 0916  BP: 106/46  Pulse: 65  Temp:   Resp: 12    Complications: No apparent anesthesia complications

## 2013-04-11 ENCOUNTER — Telehealth (INDEPENDENT_AMBULATORY_CARE_PROVIDER_SITE_OTHER): Payer: Self-pay

## 2013-04-11 ENCOUNTER — Encounter (INDEPENDENT_AMBULATORY_CARE_PROVIDER_SITE_OTHER): Payer: Self-pay

## 2013-04-11 MED ORDER — ONDANSETRON HCL 4 MG PO TABS: 4.0000 mg | ORAL_TABLET | ORAL | Status: DC | PRN

## 2013-04-11 MED ORDER — OXYCODONE HCL 5 MG PO TABS: 5.0000 mg | ORAL_TABLET | ORAL | Status: DC | PRN

## 2013-04-11 NOTE — Discharge Summary (Signed)
Physician Discharge Summary  Patient ID: Julie Nicholson MRN: [id number] DOB/AGE: September 29, 1965 48 y.o.  Admit date: 04/10/2013 Discharge date: 04/11/2013  Admission Diagnoses:  Chronic cholecystitis  Discharge Diagnoses:  Active Problems:   Chronic cholecystitis s/p lap chole with IOC   Discharged Condition: good  Hospital Course: She underwent the above procedure and had significant postop nausea.  This improved overnight and she was able to be discharged.  Discharge instructions were given to her.  Consults: none  Significant Diagnostic Studies: none  Treatments: surgery: lap chole with IOC  Discharge Exam: Blood pressure 139/75, pulse 68, temperature 97.7 F (36.5 C), temperature source Oral, resp. rate 16, height 5\' 1"  (1.549 m), weight 125 lb (56.7 kg), SpO2 94.00%.   Disposition: 01-Home or Self Care   Future Appointments Provider Department Dept Phone   04/25/2013 9:00 AM Odis Hollingshead, MD [facility], [place] [phone]       Medication List    STOP taking these medications       oxyCODONE-acetaminophen 7.5-325 MG per tablet  Commonly known as:  PERCOCET      TAKE these medications       aspirin EC 81 MG tablet  Take 81 mg by mouth at bedtime.     atorvastatin 40 MG tablet  Commonly known as:  LIPITOR  Take 40 mg by mouth at bedtime.     Biotin 5000 MCG Tabs  Take 5,000 mcg by mouth at bedtime.     FISH OIL PO  Take 1 tablet by mouth at bedtime.     lansoprazole 15 MG capsule  Commonly known as:  PREVACID  Take 15 mg by mouth 2 (two) times daily before a meal.     ondansetron 4 MG tablet  Commonly known as:  ZOFRAN  Take 1 tablet (4 mg total) by mouth every 4 (four) hours as needed for nausea.     ondansetron 8 MG disintegrating tablet  Commonly known as:  ZOFRAN-ODT  Take 8 mg by mouth every 8 (eight) hours as needed for nausea or vomiting.     oxyCODONE 5 MG immediate release tablet  Commonly known as:  Oxy IR/ROXICODONE   Take 1-2 tablets (5-10 mg total) by mouth every 4 (four) hours as needed.     oxyCODONE 5 MG immediate release tablet  Commonly known as:  Oxy IR/ROXICODONE  Take 1-2 tablets (5-10 mg total) by mouth every 4 (four) hours as needed for moderate pain.         Signed: Odis Hollingshead 04/11/2013, 8:47 AM

## 2013-04-11 NOTE — Progress Notes (Signed)
1 Day Post-Op  Subjective: Had significant nausea postop. Feeling better this AM.  Tolerating diet.  Ambulating.  Objective: Vital signs in last 24 hours: Temp:  [97 F (36.1 C)-98.4 F (36.9 C)] 97.7 F (36.5 C) (03/11 0540) Pulse Rate:  [60-95] 68 (03/11 0540) Resp:  [12-20] 16 (03/11 0540) BP: (102-139)/(46-81) 139/75 mmHg (03/11 0540) SpO2:  [93 %-99 %] 94 % (03/11 0540) Weight:  [125 lb (56.7 kg)] 125 lb (56.7 kg) (03/10 2045) Last BM Date: 04/10/13  Intake/Output from previous day: 03/10 0701 - 03/11 0700 In: 4317.5 [P.O.:960; I.V.:3357.5] Out: -  Intake/Output this shift:    PE: General- In NAD Abdomen-soft, dressings dry.  Lab Results:  No results found for this basename: WBC, HGB, HCT, PLT,  in the last 72 hours BMET No results found for this basename: NA, K, CL, CO2, GLUCOSE, BUN, CREATININE, CALCIUM,  in the last 72 hours PT/INR No results found for this basename: LABPROT, INR,  in the last 72 hours Comprehensive Metabolic Panel:    Component Value Date/Time   NA 143 04/03/2013 1418   NA 140 03/01/2013 0733   K 3.7 04/03/2013 1418   K 4.5 03/01/2013 0733   CL 102 04/03/2013 1418   CL 104 03/01/2013 0733   CO2 27 04/03/2013 1418   CO2 29 03/01/2013 0733   BUN 14 04/03/2013 1418   BUN 12 03/01/2013 0733   CREATININE 0.72 04/03/2013 1418   CREATININE 0.7 03/01/2013 0733   GLUCOSE 121* 04/03/2013 1418   GLUCOSE 87 03/01/2013 0733   CALCIUM 9.9 04/03/2013 1418   CALCIUM 10.0 03/01/2013 0733   AST 25 04/03/2013 1418   AST 21 02/13/2013 1425   ALT 38* 04/03/2013 1418   ALT 27 02/13/2013 1425   ALKPHOS 116 04/03/2013 1418   ALKPHOS 108 02/13/2013 1425   BILITOT 0.6 04/03/2013 1418   BILITOT 0.8 02/13/2013 1425   PROT 7.8 04/03/2013 1418   PROT 7.6 02/13/2013 1425   ALBUMIN 4.0 04/03/2013 1418   ALBUMIN 4.1 02/13/2013 1425     Studies/Results: Dg Cholangiogram Operative  04/10/2013   CLINICAL DATA:  Cholecystectomy.  EXAM: INTRAOPERATIVE CHOLANGIOGRAM  TECHNIQUE: Cholangiographic images  from the C-arm fluoroscopic device were submitted for interpretation post-operatively. Please see the procedural report for the amount of contrast and the fluoroscopy time utilized.  COMPARISON:  02/18/2013  FINDINGS: The common bile duct and common hepatic duct are patent. No evidence for filling defects or stones. Minor filling of the intrahepatic biliary system. Minor filling of the main pancreatic duct. Contrast drains into the duodenum.  IMPRESSION: Normal intraoperative cholangiogram. No evidence for an obstructing lesion or stone.   Electronically Signed   By: Markus Daft M.D.   On: 04/10/2013 09:17    Anti-infectives: Anti-infectives   Start     Dose/Rate Route Frequency Ordered Stop   04/10/13 0600  ceFAZolin (ANCEF) IVPB 2 g/50 mL premix     2 g 100 mL/hr over 30 Minutes Intravenous On call to O.R. 04/09/13 1453 04/10/13 0744      Assessment Active Problems:   Chronic cholecystitis s/p lap chole with IOC-doing better.    LOS: 1 day   Plan: Discharge.  Instructions given.   Audyn Dimercurio J 04/11/2013

## 2013-04-11 NOTE — Telephone Encounter (Signed)
Pt's RTW letter will be at the front desk to pick up when she can.

## 2013-04-12 ENCOUNTER — Encounter (HOSPITAL_COMMUNITY): Payer: Self-pay | Admitting: General Surgery

## 2013-04-16 ENCOUNTER — Other Ambulatory Visit (INDEPENDENT_AMBULATORY_CARE_PROVIDER_SITE_OTHER): Payer: Self-pay | Admitting: *Deleted

## 2013-04-16 ENCOUNTER — Telehealth (INDEPENDENT_AMBULATORY_CARE_PROVIDER_SITE_OTHER): Payer: Self-pay | Admitting: *Deleted

## 2013-04-16 ENCOUNTER — Other Ambulatory Visit (INDEPENDENT_AMBULATORY_CARE_PROVIDER_SITE_OTHER): Payer: Self-pay

## 2013-04-16 MED ORDER — HYDROCODONE-ACETAMINOPHEN 5-325 MG PO TABS: 1.0000 | ORAL_TABLET | Freq: Four times a day (QID) | ORAL | Status: DC | PRN

## 2013-04-16 NOTE — Telephone Encounter (Signed)
Both medications have Oxycodone in them.  Can try Norco 5/325 1-2 tabs p.o. Every 4 hours as needed for pain, disp #30.

## 2013-04-16 NOTE — Telephone Encounter (Signed)
Rx for Norco 5/325mg  #30 available for p/u at front desk along with her RTW note.

## 2013-04-16 NOTE — Telephone Encounter (Signed)
Pt called, states the Oxycodone is causing her headaches and bad dreams.  She is requesting something not as strong. States she has taken Percocet in the past and didn't have any trouble with that.  Please advise..[phone].. Thanks.Anderson Malta

## 2013-04-23 ENCOUNTER — Other Ambulatory Visit (INDEPENDENT_AMBULATORY_CARE_PROVIDER_SITE_OTHER): Payer: Self-pay

## 2013-04-23 ENCOUNTER — Telehealth (INDEPENDENT_AMBULATORY_CARE_PROVIDER_SITE_OTHER): Payer: Self-pay

## 2013-04-23 MED ORDER — OXYCODONE-ACETAMINOPHEN 7.5-325 MG PO TABS: 1.0000 | ORAL_TABLET | ORAL | Status: DC | PRN

## 2013-04-23 MED ORDER — OXYCODONE-ACETAMINOPHEN 5-325 MG PO TABS: 1.0000 | ORAL_TABLET | ORAL | Status: AC | PRN

## 2013-04-23 NOTE — Telephone Encounter (Signed)
Patient called in this morning to report that the Norco is "like eating candy" it doesn't help her pain at all.  Patient reports she is still having the right sided pain.  Patient asking for Percocet but states not Oxycodone because it causes her HA's and bad dreams.  I tried to explain to the patient that Percocet has Oxycodone in it so they are the same drug other than the tylenol which is added to Percocet.  Patient states she doesn't know but Percocet works for her and she has no problems however when she has taken the Oxycodone it causes her issues.  I asked patient about taking Ibuprofen with the Norco however patient states that she can't take Ibuprofen due to it upsets her stomach.  Explained that I would send a message to Dr. Zella Richer to get his opinion.  Patient has appt on Wednesday with Dr. Zella Richer but states she can't make it until then due to the pain and the Norco being "like candy".  Explained that once Dr. Zella Richer has reviewed her situation then we will give her a call with an update.  Patient states understanding at this time.

## 2013-04-23 NOTE — Telephone Encounter (Signed)
Patient aware that prescription is at the front desk ready for pick up.

## 2013-04-23 NOTE — Telephone Encounter (Signed)
Prescription written for Percocet 5/325 #30.

## 2013-04-23 NOTE — Telephone Encounter (Signed)
Pt made aware to p/u Rx for Percocet 5/325 mg #30 written by Dr. Zella Richer.

## 2013-04-25 ENCOUNTER — Ambulatory Visit (INDEPENDENT_AMBULATORY_CARE_PROVIDER_SITE_OTHER): Payer: BC Managed Care – PPO | Admitting: General Surgery

## 2013-04-25 ENCOUNTER — Encounter (INDEPENDENT_AMBULATORY_CARE_PROVIDER_SITE_OTHER): Payer: Self-pay

## 2013-04-25 ENCOUNTER — Encounter (INDEPENDENT_AMBULATORY_CARE_PROVIDER_SITE_OTHER): Payer: Self-pay | Admitting: General Surgery

## 2013-04-25 VITALS — BP 122/80 | HR 76 | Temp 97.8°F | Resp 16 | Ht 61.0 in | Wt 122.0 lb

## 2013-04-25 DIAGNOSIS — Z4889 Encounter for other specified surgical aftercare: Secondary | ICD-10-CM

## 2013-04-25 NOTE — Patient Instructions (Signed)
Activities as tolerated. Low-fat diet. May return to full duty work March 30.

## 2013-04-25 NOTE — Progress Notes (Signed)
She is here for a postop visit following laparoscopic cholecystectomy for chronic cholecystitis.  Her nausea is gone. She has some right upper quadrant discomfort secondary to straining to have a bowel movement as she had constipation postoperatively. PE:  ABD:  Soft, incisions clean/dry/intact and solid.  Assessment:  Doing well postop.  Plan:  Lowfat diet recommended.  Activities as tolerated.  Return visit prn.

## 2013-04-27 ENCOUNTER — Encounter: Payer: BC Managed Care – PPO | Admitting: Obstetrics and Gynecology

## 2013-12-03 ENCOUNTER — Encounter (INDEPENDENT_AMBULATORY_CARE_PROVIDER_SITE_OTHER): Payer: Self-pay | Admitting: General Surgery

## 2015-02-25 ENCOUNTER — Other Ambulatory Visit: Payer: Self-pay

## 2015-02-25 DIAGNOSIS — Z1231 Encounter for screening mammogram for malignant neoplasm of breast: Secondary | ICD-10-CM

## 2015-03-05 ENCOUNTER — Ambulatory Visit
Admission: RE | Admit: 2015-03-05 | Discharge: 2015-03-05 | Disposition: A | Discharge status: Home / Self Care | Payer: BLUE CROSS/BLUE SHIELD | Source: Ambulatory Visit

## 2015-03-05 DIAGNOSIS — Z1231 Encounter for screening mammogram for malignant neoplasm of breast: Secondary | ICD-10-CM

## 2017-06-14 ENCOUNTER — Other Ambulatory Visit: Payer: Self-pay | Admitting: *Deleted

## 2017-06-14 DIAGNOSIS — Z1231 Encounter for screening mammogram for malignant neoplasm of breast: Secondary | ICD-10-CM

## 2017-07-06 ENCOUNTER — Ambulatory Visit
Admission: RE | Admit: 2017-07-06 | Discharge: 2017-07-06 | Disposition: A | Discharge status: Home / Self Care | Payer: BLUE CROSS/BLUE SHIELD | Source: Ambulatory Visit | Attending: *Deleted | Admitting: *Deleted

## 2017-07-06 DIAGNOSIS — Z1231 Encounter for screening mammogram for malignant neoplasm of breast: Secondary | ICD-10-CM

## 2018-07-03 ENCOUNTER — Ambulatory Visit: Payer: Self-pay | Admitting: Otolaryngology

## 2018-07-06 ENCOUNTER — Other Ambulatory Visit: Payer: Self-pay

## 2018-07-10 ENCOUNTER — Other Ambulatory Visit: Payer: Self-pay

## 2018-07-10 ENCOUNTER — Other Ambulatory Visit
Admission: RE | Admit: 2018-07-10 | Discharge: 2018-07-10 | Disposition: A | Discharge status: Home / Self Care | Payer: Commercial Managed Care - PPO | Source: Ambulatory Visit | Attending: Otolaryngology | Admitting: Otolaryngology

## 2018-07-10 DIAGNOSIS — Z1159 Encounter for screening for other viral diseases: Secondary | ICD-10-CM | POA: Diagnosis present

## 2018-07-10 NOTE — Anesthesia Preprocedure Evaluation (Addendum)
Anesthesia Evaluation  Patient identified by MRN, date of birth, ID band Patient awake    Reviewed: Allergy & Precautions, NPO status , Patient's Chart, lab work & pertinent test results  History of Anesthesia Complications Negative for: history of anesthetic complications  Airway Mallampati: III   Neck ROM: Full    Dental no notable dental hx.    Pulmonary COPD, Current Smoker (4 cigarettes per day),    Pulmonary exam normal breath sounds clear to auscultation       Cardiovascular Exercise Tolerance: Good negative cardio ROS Normal cardiovascular exam Rhythm:Regular Rate:Normal     Neuro/Psych  Headaches, Hearing loss right ear    GI/Hepatic GERD  ,  Endo/Other  negative endocrine ROS  Renal/GU negative Renal ROS     Musculoskeletal   Abdominal   Peds  Hematology negative hematology ROS (+)   Anesthesia Other Findings   Reproductive/Obstetrics                            Anesthesia Physical Anesthesia Plan  ASA: II  Anesthesia Plan: General   Post-op Pain Management:    Induction: Intravenous  PONV Risk Score and Plan: 2 and Dexamethasone and Ondansetron  Airway Management Planned: LMA  Additional Equipment:   Intra-op Plan:   Post-operative Plan: Extubation in OR  Informed Consent: I have reviewed the patients History and Physical, chart, labs and discussed the procedure including the risks, benefits and alternatives for the proposed anesthesia with the patient or authorized representative who has indicated his/her understanding and acceptance.       Plan Discussed with: CRNA  Anesthesia Plan Comments:        Anesthesia Quick Evaluation

## 2018-07-11 LAB — NOVEL CORONAVIRUS, NAA (HOSP ORDER, SEND-OUT TO REF LAB; TAT 18-24 HRS): SARS-CoV-2, NAA: NOT DETECTED

## 2018-07-12 NOTE — Discharge Instructions (Signed)
[facility] DISCHARGE INSTRUCTIONS FOR MYRINGOTOMY AND TUBE INSERTION  [facility] Margaretha Sheffield, M.D. Roena Malady, M.D. Malon Kindle, M.D. Carloyn Manner, M.D.  Diet:   After surgery, the patient should take only liquids and foods as tolerated.  The patient may then have a regular diet after the effects of anesthesia have worn off, usually about four to six hours after surgery.  Activities:   The patient should rest until the effects of anesthesia have worn off.  After this, there are no restrictions on the normal daily activities.  Medications:   You will be given antibiotic drops to be used in the ears postoperatively.  It is recommended to use 3 drops 3 times a day for 3 days, then the drops should be saved for possible future use.  The tubes should not cause any discomfort to the patient, but if there is any question, Tylenol should be given according to the instructions for the age of the patient.  Other medications should be continued normally.  Precautions:   Should there be recurrent drainage after the tubes are placed, the drops should be used for approximately 3-4 days.  If it does not clear, you should call the ENT office.  Earplugs:   Earplugs are only needed for those who are going to be submerged under water.  When taking a bath or shower and using a cup or showerhead to rinse hair, it is not necessary to wear earplugs.  These come in a variety of fashions, all of which can be obtained at our office.  However, if one is not able to come by the office, then silicone plugs can be found at most pharmacies.  It is not advised to stick anything in the ear that is not approved as an earplug.  Silly putty is not to be used as an earplug.  Swimming is allowed in patients after ear tubes are inserted, however, they must wear earplugs if they are going to be submerged under water.  For those children who are going to be swimming a lot, it is  recommended to use a fitted ear mold, which can be made by our audiologist.  If discharge is noticed from the ears, this most likely represents an ear infection.  We would recommend getting your eardrops and using them as indicated above.  If it does not clear, then you should call the ENT office.  For follow up, the patient should return to the ENT office three weeks postoperatively and then every six months as required by the doctor.   General Anesthesia, Adult, Care After This sheet gives you information about how to care for yourself after your procedure. Your health care provider may also give you more specific instructions. If you have problems or questions, contact your health care provider. What can I expect after the procedure? After the procedure, the following side effects are common:  Pain or discomfort at the IV site.  Nausea.  Vomiting.  Sore throat.  Trouble concentrating.  Feeling cold or chills.  Weak or tired.  Sleepiness and fatigue.  Soreness and body aches. These side effects can affect parts of the body that were not involved in surgery. Follow these instructions at home:  For at least 24 hours after the procedure:  Have a responsible adult stay with you. It is important to have someone help care for you until you are awake and alert.  Rest as needed.  Do not: ? Participate in  activities in which you could fall or become injured. ? Drive. ? Use heavy machinery. ? Drink alcohol. ? Take sleeping pills or medicines that cause drowsiness. ? Make important decisions or sign legal documents. ? Take care of children on your own. Eating and drinking  Follow any instructions from your health care provider about eating or drinking restrictions.  When you feel hungry, start by eating small amounts of foods that are soft and easy to digest (bland), such as toast. Gradually return to your regular diet.  Drink enough fluid to keep your urine pale yellow.  If  you vomit, rehydrate by drinking water, juice, or clear broth. General instructions  If you have sleep apnea, surgery and certain medicines can increase your risk for breathing problems. Follow instructions from your health care provider about wearing your sleep device: ? Anytime you are sleeping, including during daytime naps. ? While taking prescription pain medicines, sleeping medicines, or medicines that make you drowsy.  Return to your normal activities as told by your health care provider. Ask your health care provider what activities are safe for you.  Take over-the-counter and prescription medicines only as told by your health care provider.  If you smoke, do not smoke without supervision.  Keep all follow-up visits as told by your health care provider. This is important. Contact a health care provider if:  You have nausea or vomiting that does not get better with medicine.  You cannot eat or drink without vomiting.  You have pain that does not get better with medicine.  You are unable to pass urine.  You develop a skin rash.  You have a fever.  You have redness around your IV site that gets worse. Get help right away if:  You have difficulty breathing.  You have chest pain.  You have blood in your urine or stool, or you vomit blood. Summary  After the procedure, it is common to have a sore throat or nausea. It is also common to feel tired.  Have a responsible adult stay with you for the first 24 hours after general anesthesia. It is important to have someone help care for you until you are awake and alert.  When you feel hungry, start by eating small amounts of foods that are soft and easy to digest (bland), such as toast. Gradually return to your regular diet.  Drink enough fluid to keep your urine pale yellow.  Return to your normal activities as told by your health care provider. Ask your health care provider what activities are safe for you. This  information is not intended to replace advice given to you by your health care provider. Make sure you discuss any questions you have with your health care provider. Document Released: 04/26/2000 Document Revised: 09/03/2016 Document Reviewed: 09/03/2016 Elsevier Interactive Patient Education  2019 [employer].

## 2018-07-13 ENCOUNTER — Ambulatory Visit: Payer: Commercial Managed Care - PPO | Admitting: Anesthesiology

## 2018-07-13 ENCOUNTER — Ambulatory Visit
Admission: RE | Admit: 2018-07-13 | Discharge: 2018-07-13 | Disposition: A | Discharge status: Home / Self Care | Payer: Commercial Managed Care - PPO | Attending: Otolaryngology | Admitting: Otolaryngology

## 2018-07-13 ENCOUNTER — Other Ambulatory Visit: Payer: Self-pay

## 2018-07-13 ENCOUNTER — Encounter
Admission: RE | Disposition: A | Discharge status: Home / Self Care | Payer: Self-pay | Source: Home / Self Care | Attending: Otolaryngology

## 2018-07-13 DIAGNOSIS — H6981 Other specified disorders of Eustachian tube, right ear: Secondary | ICD-10-CM | POA: Diagnosis present

## 2018-07-13 DIAGNOSIS — J301 Allergic rhinitis due to pollen: Secondary | ICD-10-CM | POA: Insufficient documentation

## 2018-07-13 DIAGNOSIS — J343 Hypertrophy of nasal turbinates: Secondary | ICD-10-CM | POA: Insufficient documentation

## 2018-07-13 DIAGNOSIS — J449 Chronic obstructive pulmonary disease, unspecified: Secondary | ICD-10-CM | POA: Diagnosis not present

## 2018-07-13 DIAGNOSIS — H6521 Chronic serous otitis media, right ear: Secondary | ICD-10-CM | POA: Diagnosis not present

## 2018-07-13 DIAGNOSIS — F17210 Nicotine dependence, cigarettes, uncomplicated: Secondary | ICD-10-CM | POA: Diagnosis not present

## 2018-07-13 DIAGNOSIS — J342 Deviated nasal septum: Secondary | ICD-10-CM | POA: Diagnosis not present

## 2018-07-13 HISTORY — DX: Allergic rhinitis, unspecified: J30.9

## 2018-07-13 HISTORY — DX: Unspecified hearing loss, unspecified ear: H91.90

## 2018-07-13 HISTORY — PX: TURBINATE REDUCTION: SHX6157

## 2018-07-13 HISTORY — PX: NASOPHARYNGOSCOPY EUSTATION TUBE BALLOON DILATION: SHX6729

## 2018-07-13 HISTORY — DX: Unspecified eustachian tube disorder, unspecified ear: H69.90

## 2018-07-13 HISTORY — DX: Gastro-esophageal reflux disease without esophagitis: K21.9

## 2018-07-13 HISTORY — DX: Deviated nasal septum: J34.2

## 2018-07-13 HISTORY — DX: Other specified disorders of Eustachian tube, unspecified ear: H69.80

## 2018-07-13 HISTORY — PX: MYRINGOTOMY WITH TUBE PLACEMENT: SHX5663

## 2018-07-13 HISTORY — DX: Headache, unspecified: R51.9

## 2018-07-13 HISTORY — DX: Other chronic pain: G89.29

## 2018-07-13 SURGERY — MYRINGOTOMY WITH TUBE PLACEMENT
Anesthesia: General | Site: Nose | Laterality: Right

## 2018-07-13 MED ORDER — LACTATED RINGERS IV SOLN
INTRAVENOUS | Status: DC
  Administered 2018-07-13: 10:00:00 via INTRAVENOUS

## 2018-07-13 MED ORDER — OXYCODONE HCL 5 MG PO TABS
5.0000 mg | ORAL_TABLET | Freq: Once | ORAL | Status: AC | PRN
  Administered 2018-07-13: 5 mg via ORAL

## 2018-07-13 MED ORDER — ACETAMINOPHEN 10 MG/ML IV SOLN: 1000.0000 mg | Freq: Once | INTRAVENOUS | Status: DC | PRN

## 2018-07-13 MED ORDER — OXYCODONE HCL 5 MG/5ML PO SOLN: 5.0000 mg | Freq: Once | ORAL | Status: AC | PRN

## 2018-07-13 MED ORDER — MIDAZOLAM HCL 5 MG/5ML IJ SOLN
INTRAMUSCULAR | Status: DC | PRN
  Administered 2018-07-13: 2 mg via INTRAVENOUS

## 2018-07-13 MED ORDER — LACTATED RINGERS IV SOLN: INTRAVENOUS | Status: DC

## 2018-07-13 MED ORDER — FENTANYL CITRATE (PF) 100 MCG/2ML IJ SOLN
INTRAMUSCULAR | Status: DC | PRN
  Administered 2018-07-13: 25 ug via INTRAVENOUS
  Administered 2018-07-13: 50 ug via INTRAVENOUS
  Administered 2018-07-13 (×2): 25 ug via INTRAVENOUS

## 2018-07-13 MED ORDER — PHENYLEPHRINE HCL 0.5 % NA SOLN
NASAL | Status: DC | PRN
  Administered 2018-07-13: 30 mL via TOPICAL

## 2018-07-13 MED ORDER — CIPROFLOXACIN-DEXAMETHASONE 0.3-0.1 % OT SUSP
OTIC | Status: DC | PRN
  Administered 2018-07-13: 2 [drp] via OTIC

## 2018-07-13 MED ORDER — FENTANYL CITRATE (PF) 100 MCG/2ML IJ SOLN
25.0000 ug | INTRAMUSCULAR | Status: DC | PRN
  Administered 2018-07-13: 50 ug via INTRAVENOUS
  Administered 2018-07-13: 25 ug via INTRAVENOUS

## 2018-07-13 MED ORDER — LIDOCAINE HCL (CARDIAC) PF 100 MG/5ML IV SOSY
PREFILLED_SYRINGE | INTRAVENOUS | Status: DC | PRN
  Administered 2018-07-13: 40 mg via INTRATRACHEAL

## 2018-07-13 MED ORDER — ONDANSETRON HCL 4 MG/2ML IJ SOLN: 4.0000 mg | Freq: Once | INTRAMUSCULAR | Status: DC | PRN

## 2018-07-13 MED ORDER — OXYMETAZOLINE HCL 0.05 % NA SOLN
2.0000 | Freq: Once | NASAL | Status: AC
  Administered 2018-07-13: 2 via NASAL

## 2018-07-13 MED ORDER — GLYCOPYRROLATE 0.2 MG/ML IJ SOLN
INTRAMUSCULAR | Status: DC | PRN
  Administered 2018-07-13: 0.1 mg via INTRAVENOUS

## 2018-07-13 MED ORDER — DEXAMETHASONE SODIUM PHOSPHATE 4 MG/ML IJ SOLN
INTRAMUSCULAR | Status: DC | PRN
  Administered 2018-07-13: 10 mg via INTRAVENOUS

## 2018-07-13 MED ORDER — PROPOFOL 10 MG/ML IV BOLUS
INTRAVENOUS | Status: DC | PRN
  Administered 2018-07-13: 100 mg via INTRAVENOUS

## 2018-07-13 MED ORDER — ONDANSETRON HCL 4 MG/2ML IJ SOLN
INTRAMUSCULAR | Status: DC | PRN
  Administered 2018-07-13: 4 mg via INTRAVENOUS

## 2018-07-13 MED ORDER — CIPRODEX 0.3-0.1 % OT SUSP: 4.0000 [drp] | Freq: Three times a day (TID) | OTIC | 0 refills | Status: DC

## 2018-07-13 SURGICAL SUPPLY — 28 items
BALL CTTN LRG ABS STRL LF (GAUZE/BANDAGES/DRESSINGS) ×2
BALLN CATH EUST TUBE 6X16 (BALLOONS) ×3
BLADE MYR LANCE NRW W/HDL (BLADE) ×3 IMPLANT
CANISTER SUCT 1200ML W/VALVE (MISCELLANEOUS) ×3 IMPLANT
CATH BALLOON EUST TUBE 6X16 (BALLOONS) ×2 IMPLANT
COAGULATOR SUCT 8FR VV (MISCELLANEOUS) ×1 IMPLANT
COTTONBALL LRG STERILE PKG (GAUZE/BANDAGES/DRESSINGS) ×3 IMPLANT
DEVICE INFLATION SEID (MISCELLANEOUS) ×3 IMPLANT
DRAPE HEAD BAR (DRAPES) ×3 IMPLANT
ELECT REM PT RETURN 9FT ADLT (ELECTROSURGICAL) ×3
ELECTRODE REM PT RTRN 9FT ADLT (ELECTROSURGICAL) ×2 IMPLANT
GLOVE PI ULTRA LF STRL 7.5 (GLOVE) ×2 IMPLANT
GLOVE PI ULTRA NON LATEX 7.5 (GLOVE) ×1
GOWN STRL REUS W/ TWL LRG LVL3 (GOWN DISPOSABLE) ×2 IMPLANT
GOWN STRL REUS W/TWL LRG LVL3 (GOWN DISPOSABLE) ×3
NDL ANESTHESIA 27G X 3.5 (NEEDLE) ×2 IMPLANT
NEEDLE ANESTHESIA  27G X 3.5 (NEEDLE) ×1
NEEDLE ANESTHESIA 27G X 3.5 (NEEDLE) ×2 IMPLANT
NS IRRIG 500ML POUR BTL (IV SOLUTION) ×3 IMPLANT
PACK ENT CUSTOM (PACKS) ×3 IMPLANT
PATTIES SURGICAL .5 X3 (DISPOSABLE) ×3 IMPLANT
SOL ANTI-FOG 6CC FOG-OUT (MISCELLANEOUS) ×2 IMPLANT
SOL FOG-OUT ANTI-FOG 6CC (MISCELLANEOUS) ×1
STRAP BODY AND KNEE 60X3 (MISCELLANEOUS) ×3 IMPLANT
TOWEL OR 17X26 4PK STRL BLUE (TOWEL DISPOSABLE) ×3 IMPLANT
TUBE EAR ARMSTRONG FL 1.14X4.5 (OTOLOGIC RELATED) ×5 IMPLANT
TUBING CONN 6MMX3.1M (TUBING) ×1
TUBING SUCTION CONN 0.25 STRL (TUBING) ×2 IMPLANT

## 2018-07-13 NOTE — Anesthesia Procedure Notes (Signed)
Procedure Name: LMA Insertion Date/Time: 07/13/2018 10:44 AM Performed by: Mayme Genta, CRNA Pre-anesthesia Checklist: Patient identified, Emergency Drugs available, Suction available, Timeout performed and Patient being monitored Patient Re-evaluated:Patient Re-evaluated prior to induction Oxygen Delivery Method: Circle system utilized Preoxygenation: Pre-oxygenation with 100% oxygen Induction Type: IV induction LMA: LMA inserted LMA Size: 3.0 Number of attempts: 1 Placement Confirmation: positive ETCO2 and breath sounds checked- equal and bilateral Tube secured with: Tape

## 2018-07-13 NOTE — Anesthesia Postprocedure Evaluation (Signed)
Anesthesia Post Note  Patient: Champayne Kocian Arons  Procedure(s) Performed: MYRINGOTOMY WITH TUBE PLACEMENT (Right Ear) NASOPHARYNGOSCOPY EUSTATION TUBE BALLOON DILATION (Right Nose) OUTFRACTURE TURBINATE (Right Nose)  Patient location during evaluation: PACU Anesthesia Type: General Level of consciousness: awake and alert, oriented and patient cooperative Pain management: pain level controlled Vital Signs Assessment: post-procedure vital signs reviewed and stable Respiratory status: spontaneous breathing, nonlabored ventilation and respiratory function stable Cardiovascular status: blood pressure returned to baseline and stable Postop Assessment: adequate PO intake Anesthetic complications: no    Darrin Nipper

## 2018-07-13 NOTE — Transfer of Care (Signed)
Immediate Anesthesia Transfer of Care Note  Patient: Adin Lariccia Mcglocklin  Procedure(s) Performed: MYRINGOTOMY WITH TUBE PLACEMENT (Right Ear) NASOPHARYNGOSCOPY EUSTATION TUBE BALLOON DILATION (Right Nose) OUTFRACTURE TURBINATE (Right Nose)  Patient Location: PACU  Anesthesia Type: General  Level of Consciousness: awake, alert  and patient cooperative  Airway and Oxygen Therapy: Patient Spontanous Breathing and Patient connected to supplemental oxygen  Post-op Assessment: Post-op Vital signs reviewed, Patient's Cardiovascular Status Stable, Respiratory Function Stable, Patent Airway and No signs of Nausea or vomiting  Post-op Vital Signs: Reviewed and stable  Complications: No apparent anesthesia complications

## 2018-07-13 NOTE — H&P (Signed)
H&P has been reviewed and patient reevaluated, no changes necessary. To be downloaded later.  

## 2018-07-13 NOTE — Op Note (Signed)
07/13/2018  11:12 AM    Julie Nicholson  [id number]   Pre-Op Dx: Chronic right serous otitis media, right eustachian tube dysfunction, right inferior turbinate hypertrophy  Post-op Dx: Same  Proc: Right myringotomy with tube, nasopharyngoscopy, outfracture right inferior turbinate, balloon dilation of right eustachian tube  Surg: Huey Romans  Anes:  General by laryngeal mask  EBL:  None  Comp: None  Findings: The right eardrum had scarring and some retraction with minimal fluid suctioned out.  A short Armstrong 5 tube was placed.  The right inferior turbinate was enlarged and was outfractured to obtain visualization of the nasopharynx for the balloon dilation.  Procedure: With the patient in a comfortable supine position, general mask anesthesia was administered.  At an appropriate level, microscope and speculum were used to examine and clean the RIGHT ear canal.  The findings were as described above.  An anterior inferior radial myringotomy incision was sharply executed.  Middle ear contents were suctioned clear.  A PE tube was placed without difficulty.  Ciprodex otic solution was instilled into the external canal, and insufflated into the middle ear.  A cotton ball was placed at the external meatus. Hemostasis was observed.  This side was completed.  A 0 degrees scope was then used to visualize the nose.  Cottonoid pledgets of been placed in the nose for vasoconstriction using pledgets soaked in Afrin and lidocaine.  Cotton pledgets were removed and the scope was placed through the nose into the nasopharynx.  There is no sign of abnormalities the adenoids.  The torus tubarius was relatively large on both sides.  There is no obstruction at the opening to the eustachian tube.  The eustachian tube balloon catheter was then passed through the right nostril under direct vision.  The catheter was then placed at the opening eustachian tube and was advanced into the eustachian tube.  This  went in to the length of the balloon and only about 1/4 inch was still sitting out at the opening.  The balloon was then dilated to 12 cm of pressure and allowed to sit for 3 minutes.  The balloon was then deflated and withdrawn from the eustachian tube.  There is no bleeding here or any signs of problems.  The catheter was removed from the nose.  The nose revisualized and there is no sign of any obstruction or bleeding problems.  Following this  The patient was returned to anesthesia, awakened, and transferred to recovery in stable condition.  Dispo:  PACU to home  Plan: Routine drop use and water precautions.  Recheck my office 2 to three weeks with an audiogramHuey Romans 11:12 AM 07/13/2018

## 2018-07-14 ENCOUNTER — Encounter: Payer: Self-pay | Admitting: Otolaryngology

## 2019-02-19 ENCOUNTER — Ambulatory Visit: Payer: Commercial Managed Care - PPO | Attending: Internal Medicine

## 2019-02-19 DIAGNOSIS — Z20822 Contact with and (suspected) exposure to covid-19: Secondary | ICD-10-CM

## 2019-02-20 LAB — NOVEL CORONAVIRUS, NAA: SARS-CoV-2, NAA: NOT DETECTED

## 2020-05-22 ENCOUNTER — Emergency Department (HOSPITAL_COMMUNITY)
Admission: EM | Admit: 2020-05-22 | Discharge: 2020-05-22 | Disposition: A | Discharge status: Home / Self Care | Payer: Commercial Managed Care - PPO | Attending: Emergency Medicine | Admitting: Emergency Medicine

## 2020-05-22 ENCOUNTER — Other Ambulatory Visit: Payer: Self-pay

## 2020-05-22 ENCOUNTER — Emergency Department (HOSPITAL_COMMUNITY): Payer: Commercial Managed Care - PPO

## 2020-05-22 ENCOUNTER — Encounter (HOSPITAL_COMMUNITY): Payer: Self-pay

## 2020-05-22 DIAGNOSIS — Z7951 Long term (current) use of inhaled steroids: Secondary | ICD-10-CM | POA: Diagnosis not present

## 2020-05-22 DIAGNOSIS — Z7982 Long term (current) use of aspirin: Secondary | ICD-10-CM | POA: Diagnosis not present

## 2020-05-22 DIAGNOSIS — J449 Chronic obstructive pulmonary disease, unspecified: Secondary | ICD-10-CM | POA: Insufficient documentation

## 2020-05-22 DIAGNOSIS — W010XXA Fall on same level from slipping, tripping and stumbling without subsequent striking against object, initial encounter: Secondary | ICD-10-CM | POA: Insufficient documentation

## 2020-05-22 DIAGNOSIS — S4992XA Unspecified injury of left shoulder and upper arm, initial encounter: Secondary | ICD-10-CM | POA: Diagnosis present

## 2020-05-22 DIAGNOSIS — F17210 Nicotine dependence, cigarettes, uncomplicated: Secondary | ICD-10-CM | POA: Insufficient documentation

## 2020-05-22 DIAGNOSIS — S52502A Unspecified fracture of the lower end of left radius, initial encounter for closed fracture: Secondary | ICD-10-CM | POA: Diagnosis not present

## 2020-05-22 MED ORDER — ONDANSETRON HCL 4 MG/2ML IJ SOLN
4.0000 mg | Freq: Once | INTRAMUSCULAR | Status: AC
  Administered 2020-05-22: 4 mg via INTRAVENOUS
  Filled 2020-05-22: qty 2

## 2020-05-22 MED ORDER — HYDROMORPHONE HCL 1 MG/ML IJ SOLN
0.5000 mg | Freq: Once | INTRAMUSCULAR | Status: AC
  Administered 2020-05-22: 0.5 mg via INTRAVENOUS
  Filled 2020-05-22: qty 1

## 2020-05-22 MED ORDER — HYDROCODONE-ACETAMINOPHEN 5-325 MG PO TABS
2.0000 | ORAL_TABLET | Freq: Once | ORAL | Status: AC
  Administered 2020-05-22: 2 via ORAL
  Filled 2020-05-22: qty 2

## 2020-05-22 MED ORDER — BUPIVACAINE HCL (PF) 0.5 % IJ SOLN
20.0000 mL | Freq: Once | INTRAMUSCULAR | Status: AC
  Administered 2020-05-22: 20 mL
  Filled 2020-05-22: qty 20

## 2020-05-22 MED ORDER — OXYCODONE HCL 5 MG PO TABS: 5.0000 mg | ORAL_TABLET | Freq: Four times a day (QID) | ORAL | 0 refills | Status: DC | PRN

## 2020-05-22 MED ORDER — MORPHINE SULFATE (PF) 2 MG/ML IV SOLN
2.0000 mg | Freq: Once | INTRAVENOUS | Status: AC
  Administered 2020-05-22: 2 mg via INTRAVENOUS
  Filled 2020-05-22: qty 1

## 2020-05-22 MED ORDER — SODIUM CHLORIDE 0.9 % IV SOLN
25.0000 mg | Freq: Once | INTRAVENOUS | Status: AC
  Administered 2020-05-22: 25 mg via INTRAVENOUS
  Filled 2020-05-22: qty 1

## 2020-05-22 NOTE — ED Notes (Signed)
Ortho tech @ bedside performing splint placement. Pt stated she is nauseated provider aware.

## 2020-05-22 NOTE — ED Provider Notes (Signed)
[facility] EMERGENCY DEPARTMENT Provider Note   CSN: [id number] Arrival date & time: 05/22/20  1659     History Chief Complaint  Patient presents with  . Fall    Julie Nicholson is a 55 y.o. female.  Patient with history of COPD presents the emergency department today for evaluation of left arm injury occurring just prior to arrival.  Patient was attempting to take off a leaf blower when she fell backwards and landed on an outstretched left arm.  She felt her wrist to break.  She was brought to the emergency department.  She complains of pain in the wrist and decreased sensation in the hand.  Forearm was splinted prior to arrival, no other treatments.  Patient denies other injuries.        Past Medical History:  Diagnosis Date  . Abdominal discomfort   . Chronic headaches    due to allergies, sinus  . COPD (chronic obstructive pulmonary disease) ([place])    per 2012 chest xray   pt states she doesn not have this now (04/10/2013)  . Deviated nasal septum   . Eustachian tube dysfunction   . GERD (gastroesophageal reflux disease)    occasional uses Tums / Rolaids  . Hearing loss    right ear  . High cholesterol   . Migraine    "only once in a blue moon since RX'd allergy shots" (04/10/2013)  . Pancreatitis 02/08/2013  . Rhinitis, allergic     Patient Active Problem List   Diagnosis Date Noted  . Chronic cholecystitis 03/13/2013  . RUQ pain 03/06/2013    Past Surgical History:  Procedure Laterality Date  . ABDOMINAL HYSTERECTOMY  1995   tx endometriosis, both ovaries removed  . APPENDECTOMY  late 1990's  . CHOLECYSTECTOMY  04/10/2013  . CHOLECYSTECTOMY N/A 04/10/2013   Procedure: LAPAROSCOPIC CHOLECYSTECTOMY WITH INTRAOPERATIVE CHOLANGIOGRAM;  Surgeon: Odis Hollingshead, MD;  Location: [facility];  Service: General;  Laterality: N/A;  . EUS N/A 02/16/2013   Procedure: UPPER ENDOSCOPIC ULTRASOUND (EUS) LINEAR;  Surgeon: Milus Banister, MD;  Location: [facility]  ENDOSCOPY;  Service: Endoscopy;  Laterality: N/A;  radial linear  . KNEE ARTHROSCOPY Right 1980's   "cartilage OR"  . LAPAROSCOPIC ENDOMETRIOSIS FULGURATION  1980's  . MYRINGOTOMY WITH TUBE PLACEMENT Right 07/13/2018   Procedure: MYRINGOTOMY WITH TUBE PLACEMENT;  Surgeon: Margaretha Sheffield, MD;  Location: [facility];  Service: ENT;  Laterality: Right;  . NASOPHARYNGOSCOPY EUSTATION TUBE BALLOON DILATION Right 07/13/2018   Procedure: NASOPHARYNGOSCOPY EUSTATION TUBE BALLOON DILATION;  Surgeon: Margaretha Sheffield, MD;  Location: [facility];  Service: ENT;  Laterality: Right;  . TONSILLECTOMY AND ADENOIDECTOMY  ~ 1980   adenoidectomy  . TUBAL LIGATION  ~ 1987  . TURBINATE REDUCTION Right 07/13/2018   Procedure: OUTFRACTURE TURBINATE;  Surgeon: Margaretha Sheffield, MD;  Location: [facility];  Service: ENT;  Laterality: Right;     OB History    Gravida  3   Para  2   Term      Preterm      AB      Living        SAB      IAB      Ectopic      Multiple      Live Births              Family History  Problem Relation Age of Onset  . Diabetes Father   . Diabetes Mother   .  COPD Mother   . COPD Maternal Grandfather   . COPD Maternal Aunt   . COPD Maternal Uncle     Social History   Tobacco Use  . Smoking status: Current Every Day Smoker    Packs/day: 0.25    Years: 33.00    Pack years: 8.25    Types: Cigarettes  . Smokeless tobacco: Never Used  . Tobacco comment: 4-5 cigs/ off and on  Vaping Use  . Vaping Use: Never used  Substance Use Topics  . Alcohol use: Not Currently    Comment: only alcohol use twice  per year (04/10/2013)  . Drug use: No    Home Medications Prior to Admission medications   Medication Sig Start Date End Date Taking? Authorizing Provider  aspirin EC 81 MG tablet Take 81 mg by mouth at bedtime.    [provider]  atorvastatin (LIPITOR) 40 MG tablet Take 40 mg by mouth at bedtime.    [provider]   Biotin 5000 MCG TABS Take 5,000 mcg by mouth at bedtime.    [provider]  ciprofloxacin-dexamethasone (CIPRODEX) OTIC suspension Place 4 drops into both ears 3 (three) times daily. 07/13/18   Margaretha Sheffield, MD  fluticasone (FLONASE) 50 MCG/ACT nasal spray Place 2 sprays into both nostrils daily. At bedtime    [provider]  lansoprazole (PREVACID) 15 MG capsule Take 15 mg by mouth 2 (two) times daily before a meal. 03/06/13   Gatha Mayer, MD  montelukast (SINGULAIR) 10 MG tablet Take 10 mg by mouth at bedtime.    [provider]  Omega-3 Fatty Acids (FISH OIL PO) Take 1 tablet by mouth at bedtime.    [provider]  oxycodone (OXY-IR) 5 MG capsule Take 5 mg by mouth every 4 (four) hours as needed.    [provider]    Allergies    Patient has no known allergies.  Review of Systems   Review of Systems  Constitutional: Negative for activity change.  Musculoskeletal: Positive for arthralgias and joint swelling. Negative for back pain and neck pain.  Skin: Negative for wound.  Neurological: Negative for weakness, numbness and headaches.    Physical Exam Updated Vital Signs BP 128/64 (BP Location: Right Arm)   Pulse 68   Temp 98.9 F (37.2 C) (Oral)   Resp 16   SpO2 99%   Physical Exam Vitals and nursing note reviewed.  Constitutional:      Appearance: She is well-developed.  HENT:     Head: Normocephalic and atraumatic.  Eyes:     Pupils: Pupils are equal, round, and reactive to light.  Cardiovascular:     Pulses: Normal pulses. No decreased pulses.  Musculoskeletal:        General: Tenderness present.     Left hand: No swelling, tenderness or bony tenderness. Decreased range of motion. Normal capillary refill.     Cervical back: Normal range of motion and neck supple.     Comments: Left hand: Pt with worn nail polish, able to see that she has normal cap refill. She is able to feel me touch every digit distally, but states  generalized "numbness" (decreased sensation)  Skin:    General: Skin is warm and dry.  Neurological:     Mental Status: She is alert.     Sensory: No sensory deficit.     Comments: Motor, sensation, and vascular distal to the injury is fully intact.      ED Results / Procedures /  Treatments   Labs (all labs ordered are listed, but only abnormal results are displayed) Labs Reviewed - No data to display  EKG None  Radiology DG Forearm Left  Result Date: 05/22/2020 CLINICAL DATA:  Left wrist pain after fall. EXAM: LEFT FOREARM - 2 VIEW COMPARISON:  None. FINDINGS: Severely displaced and comminuted fracture is seen involving the distal left radius with intra-articular extension. The ulna is unremarkable. IMPRESSION: Severely comminuted and displaced distal left radial fracture with intra-articular extension. Electronically Signed   By: Marijo Conception M.D.   On: 05/22/2020 17:49   DG Wrist Complete Left  Result Date: 05/22/2020 CLINICAL DATA:  Status post reduction EXAM: LEFT WRIST - COMPLETE 3+ VIEW COMPARISON:  Films from earlier in the same day. FINDINGS: Splinting material is now seen. The distal radial fracture has been reduced although some minimal posterior angulation remains. No other focal abnormality is noted. IMPRESSION: Interval reduction with some residual posterior angulation at the fracture site. Electronically Signed   By: Inez Catalina M.D.   On: 05/22/2020 21:56   DG Wrist Complete Left  Result Date: 05/22/2020 CLINICAL DATA:  Left wrist pain after fall. EXAM: LEFT WRIST - COMPLETE 3+ VIEW COMPARISON:  None. FINDINGS: Severely displaced and comminuted fracture is seen involving the distal left radius with intra-articular extension. No other bony abnormality is noted. IMPRESSION: Severely displaced and comminuted distal left radial fracture with intra-articular extension. Significant posterior displacement of fracture fragments and more distal bones is noted. Electronically  Signed   By: Marijo Conception M.D.   On: 05/22/2020 17:50   DG Humerus Left  Result Date: 05/22/2020 CLINICAL DATA:  Left arm pain after fall. EXAM: LEFT HUMERUS - 2+ VIEW COMPARISON:  None. FINDINGS: There is no evidence of fracture or other focal bone lesions. Soft tissues are unremarkable. IMPRESSION: Negative. Electronically Signed   By: Marijo Conception M.D.   On: 05/22/2020 17:48    Procedures Reduction of fracture  Date/Time: 05/22/2020 9:00 PM Performed by: Carlisle Cater, PA-C Authorized by: Carlisle Cater, PA-C  Consent: Verbal consent obtained. Risks and benefits: risks, benefits and alternatives were discussed Consent given by: patient Required items: required blood products, implants, devices, and special equipment available Patient identity confirmed: verbally with patient, arm band and provided demographic data Local anesthesia used: no  Anesthesia: Local anesthesia used: no  Sedation: Patient sedated: no  Comments: Patient placed in finger traps with 10 lbs from elbow for 15 minutes, tolerated well. Alignment appears improved. Distal CMS intact prior to and after procedure.       Medications Ordered in ED Medications  morphine 2 MG/ML injection 2 mg (has no administration in time range)  HYDROcodone-acetaminophen (NORCO/VICODIN) 5-325 MG per tablet 2 tablet (2 tablets Oral Given 05/22/20 1720)  HYDROmorphone (DILAUDID) injection 0.5 mg (0.5 mg Intravenous Given 05/22/20 1954)  ondansetron (ZOFRAN) injection 4 mg (4 mg Intravenous Given 05/22/20 1953)  promethazine (PHENERGAN) 25 mg in sodium chloride 0.9 % 50 mL IVPB (0 mg Intravenous Stopped 05/22/20 2111)  HYDROmorphone (DILAUDID) injection 0.5 mg (0.5 mg Intravenous Given 05/22/20 2023)  bupivacaine (MARCAINE) 0.5 % injection 20 mL (20 mLs Infiltration Given by Other 05/22/20 2045)    ED Course  I have reviewed the triage vital signs and the nursing notes.  Pertinent labs & imaging results that were available  during my care of the patient were reviewed by me and considered in my medical decision making (see chart for details).  Patient seen and examined. Work-up  initiated. Medications ordered.   Vital signs reviewed and are as follows: BP 128/64 (BP Location: Right Arm)   Pulse 68   Temp 98.9 F (37.2 C) (Oral)   Resp 16   SpO2 99%   7:57 PM Spoke with Dr. Fredna Dow -- agrees with attempt at reduction here in ED. Would like patient to call office tomorrow for appointment early next week.   8:57 PM Pt in traction for 15 minutes. Wrist appears improved. Splint placed, will obtain post-reduction.   10:19 PM postreduction films with improvement.  Plan for discharge to home.  Patient is in sling and splinted.  Will give an additional dose of IV pain medication prior to discharge.  [place] substance reporting database reviewed.  Patient recently received #90 oxycodone 5 mg tablets.   On exam, no signs of compartment syndrome.  Distal sensation intact with normal capillary refill.  Patient counseled on use of narcotic pain medications. Counseled not to combine these medications with others containing tylenol. Urged not to drink alcohol, drive, or perform any other activities that requires focus while taking these medications. The patient verbalizes understanding and agrees with the plan.    MDM Rules/Calculators/A&P                          Patient with wrist fracture, improved in the ED after reduction.  No signs of compartment syndrome.  Patient appears comfortable.   Final Clinical Impression(s) / ED Diagnoses Final diagnoses:  Closed fracture of distal end of left radius, unspecified fracture morphology, initial encounter    Rx / DC Orders ED Discharge Orders         Ordered    oxyCODONE (OXY IR/ROXICODONE) 5 MG immediate release tablet  Every 6 hours PRN        05/22/20 2219           Carlisle Cater, PA-C 05/22/20 2222    Valarie Merino, MD 05/22/20 2316

## 2020-05-22 NOTE — ED Notes (Signed)
Pt given food and drink per request. Sling in place and secured to L arm. L radial pulse +2.  Pt resting in bed @ this time w/ NAD noted. VSS. Updated on plan of care. Deny needs or concerns @ this time. Bed low and locked.

## 2020-05-22 NOTE — Discharge Instructions (Signed)
Please read and follow all provided instructions.  Your diagnoses today include:  1. Closed fracture of distal end of left radius, unspecified fracture morphology, initial encounter     Tests performed today include:  An x-ray of the affected area - shows broken wrist  Vital signs. See below for your results today.   Medications prescribed:   Percocet (oxycodone/acetaminophen) - narcotic pain medication  DO NOT drive or perform any activities that require you to be awake and alert because this medicine can make you drowsy. BE VERY CAREFUL not to take multiple medicines containing Tylenol (also called acetaminophen). Doing so can lead to an overdose which can damage your liver and cause liver failure and possibly death.  Take any prescribed medications only as directed.  Home care instructions:   Follow any educational materials contained in this packet  Follow R.I.C.E. Protocol:  R - rest your injury   I  - use ice on injury without applying directly to skin  C - compress injury with bandage or splint  E - elevate the injury as much as possible  Follow-up instructions: Please follow-up with your primary care provider or the provided orthopedic physician (bone specialist) if you continue to have significant pain in 1 week. In this case you may have a more severe injury that requires further care.   Return instructions:   Please return if your fingers are numb or tingling, appear gray or blue, or you have severe pain (also elevate the arm and loosen splint or wrap if you were given one)  Please return to the Emergency Department if you experience worsening symptoms.   Please return if you have any other emergent concerns.  Additional Information:  Your vital signs today were: BP (!) 113/48   Pulse 73   Temp 98.9 F (37.2 C) (Oral)   Resp 17   SpO2 97%  If your blood pressure (BP) was elevated above 135/85 this visit, please have this repeated by your doctor within  one month. --------------

## 2020-05-22 NOTE — ED Triage Notes (Signed)
Pt was blowing leaves in yard at approximately 1600, states she tripped and tried to catch herself with left arm snapping when she landed. Splint made by neighbor intact on arrival.  Pt has +PMS in left hand.

## 2020-05-22 NOTE — ED Notes (Signed)
Medications follow up appts reviewed w/ pt. Denies questions or concerns @ this time. Education on s/s of worsening and when to return. Pt evaluated after fall and tx for fracture to to radius splint applied. Pt had sling in place education on use. prescriptions sent to verified pharmacy. Stats relief of pain. Left w/ even and steady gait. NAD noted. PIV removed and VSS.

## 2020-05-22 NOTE — ED Notes (Signed)
MSE done   Tacy Learn, PA-C 05/22/20 1713

## 2020-05-22 NOTE — ED Notes (Signed)
Short splint to L arm. Pt tolerated procedure well,. Denies nausea @ this time. Aware awiting repeat imaging.

## 2020-05-22 NOTE — ED Triage Notes (Addendum)
Emergency Medicine Provider Triage Evaluation Note  Julie Nicholson , a 55 y.o. female  was evaluated in triage.  Pt complains of left arm injury. Tripped while stepping backwards blowing leaves, left wrist snapped. No LOC, did not hit head.  Review of Systems  Positive: Left arm injury Negative: Neck pain, head injury  Physical Exam  There were no vitals taken for this visit. Gen:   Awake, no distress   HEENT:  Atraumatic  Resp:  Normal effort  Cardiac:  Normal rate  Abd:   Nondistended, nontender  MSK:   Left arm splinted, sensation intact to each finger with radial pulse present. TTP left humerus, elbow, wrist +deformity Neuro:  Speech clear   Medical Decision Making  Medically screening exam initiated at 5:06 PM.  Appropriate orders placed.  Julie Nicholson was informed that the remainder of the evaluation will be completed by another provider, this initial triage assessment does not replace that evaluation, and the importance of remaining in the ED until their evaluation is complete.  Clinical Impression     Tacy Learn, PA-C 05/22/20 1709    Tacy Learn, PA-C 05/22/20 1713

## 2020-05-22 NOTE — Progress Notes (Signed)
Orthopedic Tech Progress Note Patient Details:  Julie Nicholson 09-11-1965 [id number]  Patient ID: [name], female   DOB: March 25, 1965, 55 y.o.   MRN: [id number]   [name] 05/22/2020, 9:12 PM Plaster sugartong applied to left arm. Sling applied. Finger traps w/10lbs

## 2020-11-27 ENCOUNTER — Other Ambulatory Visit: Payer: Self-pay

## 2020-11-27 ENCOUNTER — Encounter (HOSPITAL_COMMUNITY): Payer: Self-pay | Admitting: Emergency Medicine

## 2020-11-27 ENCOUNTER — Emergency Department (HOSPITAL_COMMUNITY): Payer: Commercial Managed Care - PPO

## 2020-11-27 ENCOUNTER — Emergency Department (HOSPITAL_COMMUNITY)
Admission: EM | Admit: 2020-11-27 | Discharge: 2020-11-27 | Disposition: A | Discharge status: Home / Self Care | Payer: Commercial Managed Care - PPO | Attending: Emergency Medicine | Admitting: Emergency Medicine

## 2020-11-27 DIAGNOSIS — R911 Solitary pulmonary nodule: Secondary | ICD-10-CM | POA: Diagnosis not present

## 2020-11-27 DIAGNOSIS — J029 Acute pharyngitis, unspecified: Secondary | ICD-10-CM | POA: Diagnosis not present

## 2020-11-27 DIAGNOSIS — F17210 Nicotine dependence, cigarettes, uncomplicated: Secondary | ICD-10-CM | POA: Insufficient documentation

## 2020-11-27 DIAGNOSIS — R0789 Other chest pain: Secondary | ICD-10-CM | POA: Insufficient documentation

## 2020-11-27 DIAGNOSIS — Z20822 Contact with and (suspected) exposure to covid-19: Secondary | ICD-10-CM | POA: Insufficient documentation

## 2020-11-27 DIAGNOSIS — J449 Chronic obstructive pulmonary disease, unspecified: Secondary | ICD-10-CM | POA: Insufficient documentation

## 2020-11-27 DIAGNOSIS — R519 Headache, unspecified: Secondary | ICD-10-CM | POA: Insufficient documentation

## 2020-11-27 DIAGNOSIS — R0602 Shortness of breath: Secondary | ICD-10-CM | POA: Diagnosis not present

## 2020-11-27 LAB — RESP PANEL BY RT-PCR (FLU A&B, COVID) ARPGX2
Influenza A by PCR: NEGATIVE
Influenza B by PCR: NEGATIVE
SARS Coronavirus 2 by RT PCR: NEGATIVE

## 2020-11-27 LAB — BASIC METABOLIC PANEL
Anion gap: 9 (ref 5–15)
BUN: 6 mg/dL (ref 6–20)
CO2: 26 mmol/L (ref 22–32)
Calcium: 9.2 mg/dL (ref 8.9–10.3)
Chloride: 101 mmol/L (ref 98–111)
Creatinine, Ser: 0.61 mg/dL (ref 0.44–1.00)
GFR, Estimated: >60 mL/min (ref >60)
Glucose, Bld: 94 mg/dL (ref 70–99)
Potassium: 3.6 mmol/L (ref 3.5–5.1)
Sodium: 136 mmol/L (ref 135–145)

## 2020-11-27 LAB — CBC
HCT: 42.9 % (ref 36.0–46.0)
Hemoglobin: 14.7 g/dL (ref 12.0–15.0)
MCH: 33 pg (ref 26.0–34.0)
MCHC: 34.3 g/dL (ref 30.0–36.0)
MCV: 96.2 fL (ref 80.0–100.0)
Platelets: 388 10*3/uL (ref 150–400)
RBC: 4.46 MIL/uL (ref 3.87–5.11)
RDW: 12 % (ref 11.5–15.5)
WBC: 13 10*3/uL — ABNORMAL HIGH (ref 4.0–10.5)
nRBC: 0 % (ref 0.0–0.2)

## 2020-11-27 LAB — GROUP A STREP BY PCR: Group A Strep by PCR: NOT DETECTED

## 2020-11-27 LAB — TROPONIN I (HIGH SENSITIVITY)
Troponin I (High Sensitivity): 3 ng/L (ref <18)
Troponin I (High Sensitivity): 3 ng/L (ref <18)

## 2020-11-27 MED ORDER — DOXYCYCLINE HYCLATE 100 MG PO CAPS: 100.0000 mg | ORAL_CAPSULE | Freq: Two times a day (BID) | ORAL | 0 refills | Status: AC

## 2020-11-27 MED ORDER — OXYCODONE-ACETAMINOPHEN 5-325 MG PO TABS
1.0000 | ORAL_TABLET | Freq: Once | ORAL | Status: AC
Start: 2020-11-27 — End: 2020-11-27
  Administered 2020-11-27: 1 via ORAL
  Filled 2020-11-27: qty 1

## 2020-11-27 MED ORDER — ALBUTEROL SULFATE HFA 108 (90 BASE) MCG/ACT IN AERS: 1.0000 | INHALATION_SPRAY | Freq: Four times a day (QID) | RESPIRATORY_TRACT | 0 refills | Status: DC | PRN

## 2020-11-27 MED ORDER — IOHEXOL 350 MG/ML SOLN
75.0000 mL | Freq: Once | INTRAVENOUS | Status: AC | PRN
  Administered 2020-11-27: 75 mL via INTRAVENOUS

## 2020-11-27 NOTE — Discharge Instructions (Signed)
You were seen in the emergency department today with chest pain and cough.  Your CT scan showed a nodule on the right side which may be causing some of your discomfort.  I have placed referrals to the oncology and pulmonology doctors you need to see you at their next available appointments.  Please call for follow-up at the referral placed here should prompt a phone call to you as well.  Please take the antibiotics and inhaler as prescribed.  If you develop new or suddenly worsening symptoms please return to the emergency department.

## 2020-11-27 NOTE — ED Triage Notes (Signed)
Patient here for evaluation of chest pain that started earlier and a stabbing in the middle upper back. Patient states she has had cold symptoms the last few days for which she was prescribed prednisone. Reports waking this morning with right sided headache and facial pain.

## 2020-11-27 NOTE — ED Provider Notes (Signed)
Emergency Medicine Provider Triage Evaluation Note  Julie Nicholson , a 55 y.o. female  was evaluated in triage.  Pt complains of right side CP onset today with stabbing sensation in back. Reports cold symptoms for the past few days, woke up today with right side headache and facial pain. Cough, PND. Took mucinex and Delsym this morning.  Back pain not worse with movement of deep breaths.  Review of Systems  Positive: CP, cough,  Negative: Fever, n/v, abdominal pain, vision changes, diaphoresis  Physical Exam  BP (!) 112/92 (BP Location: Left Arm)   Pulse 86   Temp 98.7 F (37.1 C) (Oral)   Resp 18   SpO2 98%  Gen:   Awake, no distress   Resp:  Normal effort  MSK:   Moves extremities without difficulty  Other:  Change in voice, appears to feel unwell  Medical Decision Making  Medically screening exam initiated at 12:03 PM.  Appropriate orders placed.  [name] was informed that the remainder of the evaluation will be completed by another provider, this initial triage assessment does not replace that evaluation, and the importance of remaining in the ED until their evaluation is complete.    Tacy Learn, PA-C 11/27/20 1205    Pattricia Boss, MD 11/27/20 1329

## 2020-11-27 NOTE — ED Provider Notes (Signed)
Emergency Department Provider Note   I have reviewed the triage vital signs and the nursing notes.   HISTORY  Chief Complaint Chest Pain   HPI XCARET MORAD is a 55 y.o. female with past medical history reviewed below presents to the emergency department with acute onset right-sided chest discomfort.  Patient is having pain in the right chest with stabbing pain in the right side of her back (upper).  She is not having fever but has had cough.  She had been prescribed prednisone which she is been taking over the past several days but when pain began today she presents for evaluation.  She is having some mild sore throat along with headache and pain into the right neck as well but pain seems similar to what is occurring in her chest.  She is feeling some mild shortness of breath.  She has a smoking history but does not use home oxygen.  She is not having any abdominal or lower back pain.  No UTI symptoms.   Past Medical History:  Diagnosis Date   Abdominal discomfort    Chronic headaches    due to allergies, sinus   COPD (chronic obstructive pulmonary disease) ([place])    per 2012 chest xray   pt states she doesn not have this now (04/10/2013)   Deviated nasal septum    Eustachian tube dysfunction    GERD (gastroesophageal reflux disease)    occasional uses Tums / Rolaids   Hearing loss    right ear   High cholesterol    Migraine    "only once in a blue moon since RX'd allergy shots" (04/10/2013)   Pancreatitis 02/08/2013   Rhinitis, allergic     Patient Active Problem List   Diagnosis Date Noted   Chronic cholecystitis 03/13/2013   RUQ pain 03/06/2013    Past Surgical History:  Procedure Laterality Date   ABDOMINAL HYSTERECTOMY  1995   tx endometriosis, both ovaries removed   APPENDECTOMY  late 1990's   CHOLECYSTECTOMY  04/10/2013   CHOLECYSTECTOMY N/A 04/10/2013   Procedure: LAPAROSCOPIC CHOLECYSTECTOMY WITH INTRAOPERATIVE CHOLANGIOGRAM;  Surgeon: Odis Hollingshead, MD;   Location: [place];  Service: General;  Laterality: N/A;   EUS N/A 02/16/2013   Procedure: UPPER ENDOSCOPIC ULTRASOUND (EUS) LINEAR;  Surgeon: Milus Banister, MD;  Location: [facility];  Service: Endoscopy;  Laterality: N/A;  radial linear   KNEE ARTHROSCOPY Right 1980's   "cartilage OR"   LAPAROSCOPIC ENDOMETRIOSIS FULGURATION  1980's   MYRINGOTOMY WITH TUBE PLACEMENT Right 07/13/2018   Procedure: MYRINGOTOMY WITH TUBE PLACEMENT;  Surgeon: Margaretha Sheffield, MD;  Location: [place];  Service: ENT;  Laterality: Right;   NASOPHARYNGOSCOPY EUSTATION TUBE BALLOON DILATION Right 07/13/2018   Procedure: NASOPHARYNGOSCOPY EUSTATION TUBE BALLOON DILATION;  Surgeon: Margaretha Sheffield, MD;  Location: [place];  Service: ENT;  Laterality: Right;   TONSILLECTOMY AND ADENOIDECTOMY  ~ 1980   adenoidectomy   TUBAL LIGATION  ~ [place] Right 07/13/2018   Procedure: OUTFRACTURE TURBINATE;  Surgeon: Margaretha Sheffield, MD;  Location: [place];  Service: ENT;  Laterality: Right;    Allergies Patient has no known allergies.  Family History  Problem Relation Age of Onset   Diabetes Father    Diabetes Mother    COPD Mother    COPD Maternal Grandfather    COPD Maternal Aunt    COPD Maternal Uncle     Social History Social History   Tobacco Use   Smoking  status: Every Day    Packs/day: 0.25    Years: 33.00    Pack years: 8.25    Types: Cigarettes   Smokeless tobacco: Never   Tobacco comments:    4-5 cigs/ off and on  Vaping Use   Vaping Use: Never used  Substance Use Topics   Alcohol use: Not Currently    Comment: only alcohol use twice  per year (04/10/2013)   Drug use: No    Review of Systems  Constitutional: No fever/chills Eyes: No visual changes. ENT: No sore throat. Cardiovascular:  Positive chest pain. Respiratory: Denies shortness of breath. Positive cough.  Gastrointestinal: No abdominal pain.  No nausea, no vomiting.  No diarrhea.  No  constipation. Genitourinary: Negative for dysuria. Musculoskeletal: Negative for back pain. Skin: Negative for rash. Neurological: Negative for headaches, focal weakness or numbness.  10-point ROS otherwise negative.  ____________________________________________   PHYSICAL EXAM:  VITAL SIGNS: ED Triage Vitals  Enc Vitals Group     BP 11/27/20 1150 (!) 112/92     Pulse Rate 11/27/20 1150 86     Resp 11/27/20 1150 18     Temp 11/27/20 1150 98.7 F (37.1 C)     Temp Source 11/27/20 1150 Oral     SpO2 11/27/20 1150 98 %   Constitutional: Alert and oriented. Well appearing and in no acute distress. Eyes: Conjunctivae are normal.  Head: Atraumatic. Nose: No congestion/rhinnorhea. Mouth/Throat: Mucous membranes are moist.   Neck: No stridor.  Cardiovascular: Normal rate, regular rhythm. Good peripheral circulation. Grossly normal heart sounds.   Respiratory: Normal respiratory effort.  No retractions. Lungs CTAB. Gastrointestinal: Soft and nontender. No distention.  Musculoskeletal: No lower extremity tenderness nor edema. No gross deformities of extremities. Neurologic:  Normal speech and language. No gross focal neurologic deficits are appreciated.  Skin:  Skin is warm, dry and intact. No rash noted.  ____________________________________________   LABS (all labs ordered are listed, but only abnormal results are displayed)  Labs Reviewed  CBC - Abnormal; Notable for the following components:      Result Value   WBC 13.0 (*)    All other components within normal limits  RESP PANEL BY RT-PCR (FLU A&B, COVID) ARPGX2  GROUP A STREP BY PCR  BASIC METABOLIC PANEL  TROPONIN I (HIGH SENSITIVITY)  TROPONIN I (HIGH SENSITIVITY)   ____________________________________________  EKG   EKG Interpretation  Date/Time:  Thursday November 27 2020 11:54:16 EDT Ventricular Rate:  84 PR Interval:  104 QRS Duration: 90 QT Interval:  346 QTC Calculation: 408 R Axis:   95 Text  Interpretation: Sinus rhythm with short PR Rightward axis Borderline ECG Confirmed by Nanda Quinton [phone]) on 11/27/2020 4:20:01 PM        ____________________________________________  RADIOLOGY  DG Chest 2 View  Result Date: 11/27/2020 CLINICAL DATA:  Cough, chest pain EXAM: CHEST - 2 VIEW COMPARISON:  04/03/2013 FINDINGS: The heart size and mediastinal contours are within normal limits. No acute appearing airspace opacity. Nodular opacity projects over the right midlung, measuring 2.7 cm in projection. The visualized skeletal structures are unremarkable. IMPRESSION: 1. No acute appearing airspace opacity. 2. Nodular opacity projects over the right midlung, measuring 2.7 cm in projection. Recommend CT to further evaluate. Electronically Signed   By: Delanna Ahmadi M.D.   On: 11/27/2020 13:00   CT Angio Chest PE W and/or Wo Contrast  Result Date: 11/27/2020 CLINICAL DATA:  Chest pain.  PE suspected, high prob EXAM: CT ANGIOGRAPHY CHEST WITH CONTRAST TECHNIQUE: Multidetector  CT imaging of the chest was performed using the standard protocol during bolus administration of intravenous contrast. Multiplanar CT image reconstructions and MIPs were obtained to evaluate the vascular anatomy. CONTRAST:  3mL OMNIPAQUE IOHEXOL 350 MG/ML SOLN COMPARISON:  None. FINDINGS: Cardiovascular: No filling defects in the pulmonary arteries to suggest pulmonary emboli. Heart is normal size. Aorta is normal caliber. Mediastinum/Nodes: Bulky mediastinal adenopathy. Right paratracheal lymph node has a short axis diameter of 20 mm. Subcarinal adenopathy has a short axis diameter of 17 mm. Bulky right hilar adenopathy with conglomerate mass having a short axis diameter of 24 mm. Lungs/Pleura: 11 mm nodular area in the right upper lobe on image 53 of series 6. There is a tubular soft tissue structure leading to the nodule, presumably dilated airway with mucous plugging or plugging with tumor/debris. No effusions. Left lung  clear. Upper Abdomen: Imaging into the upper abdomen demonstrates no acute findings. Musculoskeletal: Chest wall soft tissues are unremarkable. No acute bony abnormality. Review of the MIP images confirms the above findings. IMPRESSION: 11 mm spiculated nodule in the right upper lobe concerning for malignancy. Associated bulky right hilar and mediastinal adenopathy. Dilated tubular structure leads to the right upper lobe nodule, presumably dilated airway with mucous plugging or plugging with tumor/debris. No evidence of pulmonary embolus. Electronically Signed   By: Rolm Baptise M.D.   On: 11/27/2020 18:21    ____________________________________________   PROCEDURES  Procedure(s) performed:   Procedures  None  ____________________________________________   INITIAL IMPRESSION / ASSESSMENT AND PLAN / ED COURSE  Pertinent labs & imaging results that were available during my care of the patient were reviewed by me and considered in my medical decision making (see chart for details).   Patient presents to the emergency department with upper respiratory infection symptoms over the past several days but acute onset pain in the right chest.  Troponin negative x2 with pain very atypical for ACS.  X-ray shows a pulmonary nodule.  No infiltrate to suspect community-acquired pneumonia.  Patient is having some sore throat and strep here is negative.  No evidence of peritonsillar abscess or trismus.  Her COVID and flu testing is negative.   CTA obtained with patient having story concerning for possible PE.  The CT does not show a pulmonary embolism but does show a right upper lobe nodule with some surrounding mediastinal adenopathy.  Possible mucous plugging in this area.  No pneumothorax or large effusion.  Patient's vital signs are all within normal limits.  She is not having increased work of breathing or hypoxemia.  Discussed the CT scan results with her and her daughter by phone.  Do plan to start  doxycycline and albuterol but have placed outpatient referrals in our system to see both pulmonology and oncology for ASAP follow up to evaluate the etiology of this mass. Discussed strict ED return precautions.    ____________________________________________  FINAL CLINICAL IMPRESSION(S) / ED DIAGNOSES  Final diagnoses:  Atypical chest pain  Pulmonary nodule, right     MEDICATIONS GIVEN DURING THIS VISIT:  Medications  oxyCODONE-acetaminophen (PERCOCET/ROXICET) 5-325 MG per tablet 1 tablet (1 tablet Oral Given 11/27/20 1634)  iohexol (OMNIPAQUE) 350 MG/ML injection 75 mL (75 mLs Intravenous Contrast Given 11/27/20 1744)     NEW OUTPATIENT MEDICATIONS STARTED DURING THIS VISIT:  New Prescriptions   ALBUTEROL (VENTOLIN HFA) 108 (90 BASE) MCG/ACT INHALER    Inhale 1-2 puffs into the lungs every 6 (six) hours as needed for wheezing or shortness of breath.  DOXYCYCLINE (VIBRAMYCIN) 100 MG CAPSULE    Take 1 capsule (100 mg total) by mouth 2 (two) times daily for 7 days.    Note:  This document was prepared using Dragon voice recognition software and may include unintentional dictation errors.  Nanda Quinton, MD, [facility]    Aneliz Carbary, [facility], MD 11/27/20 [phone]

## 2020-11-28 ENCOUNTER — Encounter: Payer: Self-pay | Admitting: *Deleted

## 2020-11-28 ENCOUNTER — Telehealth: Payer: Self-pay | Admitting: Internal Medicine

## 2020-11-28 DIAGNOSIS — R918 Other nonspecific abnormal finding of lung field: Secondary | ICD-10-CM

## 2020-11-28 NOTE — Progress Notes (Signed)
I received referral on Julie Nicholson today. I updated new patient coordinator to call and schedule her to be seen on 10/31.

## 2020-11-28 NOTE — Telephone Encounter (Signed)
Scheduled appt per 10/27 referal. Pt is aware of appt date and time.

## 2020-11-28 NOTE — Telephone Encounter (Signed)
Scheduled appt per 10/27 referral. Pt is aware of appt date and time.  

## 2020-12-01 ENCOUNTER — Other Ambulatory Visit: Payer: Self-pay

## 2020-12-01 ENCOUNTER — Inpatient Hospital Stay (HOSPITAL_BASED_OUTPATIENT_CLINIC_OR_DEPARTMENT_OTHER): Payer: Commercial Managed Care - PPO | Admitting: Internal Medicine

## 2020-12-01 ENCOUNTER — Inpatient Hospital Stay (HOSPITAL_BASED_OUTPATIENT_CLINIC_OR_DEPARTMENT_OTHER): Payer: Commercial Managed Care - PPO | Admitting: *Deleted

## 2020-12-01 ENCOUNTER — Encounter: Payer: Self-pay | Admitting: *Deleted

## 2020-12-01 ENCOUNTER — Inpatient Hospital Stay: Payer: Commercial Managed Care - PPO | Attending: Internal Medicine

## 2020-12-01 ENCOUNTER — Encounter: Payer: Self-pay | Admitting: Internal Medicine

## 2020-12-01 VITALS — BP 128/64 | HR 87 | Temp 97.8°F | Resp 18 | Ht 62.0 in | Wt 102.5 lb

## 2020-12-01 DIAGNOSIS — R918 Other nonspecific abnormal finding of lung field: Secondary | ICD-10-CM

## 2020-12-01 DIAGNOSIS — C3411 Malignant neoplasm of upper lobe, right bronchus or lung: Secondary | ICD-10-CM | POA: Diagnosis not present

## 2020-12-01 DIAGNOSIS — C3490 Malignant neoplasm of unspecified part of unspecified bronchus or lung: Secondary | ICD-10-CM

## 2020-12-01 HISTORY — DX: Other nonspecific abnormal finding of lung field: R91.8

## 2020-12-01 LAB — CMP (CANCER CENTER ONLY)
ALT: 32 U/L (ref 0–44)
AST: 31 U/L (ref 15–41)
Albumin: 3.1 g/dL — ABNORMAL LOW (ref 3.5–5.0)
Alkaline Phosphatase: 161 U/L — ABNORMAL HIGH (ref 38–126)
Anion gap: 8 (ref 5–15)
BUN: 9 mg/dL (ref 6–20)
CO2: 29 mmol/L (ref 22–32)
Calcium: 9.8 mg/dL (ref 8.9–10.3)
Chloride: 102 mmol/L (ref 98–111)
Creatinine: 0.69 mg/dL (ref 0.44–1.00)
GFR, Estimated: >60 mL/min (ref >60)
Glucose, Bld: 95 mg/dL (ref 70–99)
Potassium: 3.6 mmol/L (ref 3.5–5.1)
Sodium: 139 mmol/L (ref 135–145)
Total Bilirubin: 1 mg/dL (ref 0.3–1.2)
Total Protein: 7.8 g/dL (ref 6.5–8.1)

## 2020-12-01 LAB — CBC WITH DIFFERENTIAL (CANCER CENTER ONLY)
Abs Immature Granulocytes: 0.03 10*3/uL (ref 0.00–0.07)
Basophils Absolute: 0 10*3/uL (ref 0.0–0.1)
Basophils Relative: 0 %
Eosinophils Absolute: 0 10*3/uL (ref 0.0–0.5)
Eosinophils Relative: 1 %
HCT: 39.5 % (ref 36.0–46.0)
Hemoglobin: 13.5 g/dL (ref 12.0–15.0)
Immature Granulocytes: 0 %
Lymphocytes Relative: 29 %
Lymphs Abs: 2.4 10*3/uL (ref 0.7–4.0)
MCH: 32.8 pg (ref 26.0–34.0)
MCHC: 34.2 g/dL (ref 30.0–36.0)
MCV: 96.1 fL (ref 80.0–100.0)
Monocytes Absolute: 0.7 10*3/uL (ref 0.1–1.0)
Monocytes Relative: 9 %
Neutro Abs: 5.1 10*3/uL (ref 1.7–7.7)
Neutrophils Relative %: 61 %
Platelet Count: 358 10*3/uL (ref 150–400)
RBC: 4.11 MIL/uL (ref 3.87–5.11)
RDW: 12.1 % (ref 11.5–15.5)
WBC Count: 8.2 10*3/uL (ref 4.0–10.5)
nRBC: 0 % (ref 0.0–0.2)

## 2020-12-01 NOTE — Progress Notes (Signed)
[place] [phone]   [phone]  CONSULT NOTE  REFERRING PHYSICIAN: Dr. Nanda Quinton, [facility]  REASON FOR CONSULTATION:  55 years old white female with highly suspicious lung cancer  HPI Julie Nicholson is a 55 y.o. female with past medical history significant for COPD, GERD, dyslipidemia, pancreatitis, allergic rhinitis as well as long history of smoking.  The patient mentioned that for couple of weeks she has been complaining of cold symptoms and a swollen gland on the right side of the neck.  She was treated with Medrol Dosepak with no improvement.  She woke up on November 27, 2020 complaining of worsening right-sided chest pain.  She presented to the emergency department at [facility] and initial chest x-ray showed nodular opacity projects over the right midlung measuring 2.7 cm.  This was followed by CT angiogram scan of the chest and that showed 1.1 cm nodular area in the right upper lobe.  There is a tubular soft tissue structure leading to the nodule or simply dilated airway with mucous plugging or plugging with tumor/debris's.  There was bulky mediastinal adenopathy including right paratracheal lymph node with short axis of 2.0, subcarinal adenopathy 1.7 cm, bulky right hilar adenopathy with conglomerate mass with short axis of 2.4 cm.  The patient was referred to me today for evaluation and recommendation regarding her condition. When seen today she is very anxious about her recent findings.  She denied having any chest pain, shortness of breath but has mild cough and no hemoptysis.  She has no nausea, vomiting, diarrhea or constipation.  She denied having any weight loss or night sweats.  She has intermittent headache with no visual changes. Family history significant for father with diabetes.  Mother had diabetes, COPD as well as cervical and rectal cancer. The patient is married and has 2 biologic children and 1  stepchild.  She works as a English as a second language teacher at [employer]  She was accompanied by her husband Julie Nicholson.  The patient has a history for smoking less than 1 pack/day for around 41 years and unfortunately continues to smoke.  She has no history of alcohol or drug abuse.  HPI  Past Medical History:  Diagnosis Date   Abdominal discomfort    Chronic headaches    due to allergies, sinus   COPD (chronic obstructive pulmonary disease) ([place])    per 2012 chest xray   pt states she doesn not have this now (04/10/2013)   Deviated nasal septum    Eustachian tube dysfunction    GERD (gastroesophageal reflux disease)    occasional uses Tums / Rolaids   Hearing loss    right ear   High cholesterol    Migraine    "only once in a blue moon since RX'd allergy shots" (04/10/2013)   Pancreatitis 02/08/2013   Rhinitis, allergic     Past Surgical History:  Procedure Laterality Date   ABDOMINAL HYSTERECTOMY  1995   tx endometriosis, both ovaries removed   APPENDECTOMY  late 1990's   CHOLECYSTECTOMY  04/10/2013   CHOLECYSTECTOMY N/A 04/10/2013   Procedure: LAPAROSCOPIC CHOLECYSTECTOMY WITH INTRAOPERATIVE CHOLANGIOGRAM;  Surgeon: Odis Hollingshead, MD;  Location: [place];  Service: General;  Laterality: N/A;   EUS N/A 02/16/2013   Procedure: UPPER ENDOSCOPIC ULTRASOUND (EUS) LINEAR;  Surgeon: Milus Banister, MD;  [facility];  Service: Endoscopy;  Laterality: N/A;  radial linear   KNEE ARTHROSCOPY Right 1980's   "cartilage OR"  LAPAROSCOPIC ENDOMETRIOSIS FULGURATION  1980's   MYRINGOTOMY WITH TUBE PLACEMENT Right 07/13/2018   Procedure: MYRINGOTOMY WITH TUBE PLACEMENT;  Surgeon: Margaretha Sheffield, MD;  Location: [place];  Service: ENT;  Laterality: Right;   NASOPHARYNGOSCOPY EUSTATION TUBE BALLOON DILATION Right 07/13/2018   Procedure: NASOPHARYNGOSCOPY EUSTATION TUBE BALLOON DILATION;  Surgeon: Margaretha Sheffield, MD;  Location: [place];  Service: ENT;  Laterality: Right;    TONSILLECTOMY AND ADENOIDECTOMY  ~ 1980   adenoidectomy   TUBAL LIGATION  ~ [place] Right 07/13/2018   Procedure: OUTFRACTURE TURBINATE;  Surgeon: Margaretha Sheffield, MD;  Location: [place];  Service: ENT;  Laterality: Right;    Family History  Problem Relation Age of Onset   Diabetes Father    Diabetes Mother    COPD Mother    COPD Maternal Grandfather    COPD Maternal Aunt    COPD Maternal Uncle     Social History Social History   Tobacco Use   Smoking status: Every Day    Packs/day: 0.25    Years: 33.00    Pack years: 8.25    Types: Cigarettes   Smokeless tobacco: Never   Tobacco comments:    4-5 cigs/ off and on  Vaping Use   Vaping Use: Never used  Substance Use Topics   Alcohol use: Not Currently    Comment: only alcohol use twice  per year (04/10/2013)   Drug use: No    Allergies  Allergen Reactions   No Known Allergies     Current Outpatient Medications  Medication Sig Dispense Refill   albuterol (VENTOLIN HFA) 108 (90 Base) MCG/ACT inhaler Inhale 1-2 puffs into the lungs every 6 (six) hours as needed for wheezing or shortness of breath. 6.7 g 0   aspirin EC 81 MG tablet Take 81 mg by mouth at bedtime.     atorvastatin (LIPITOR) 40 MG tablet Take 40 mg by mouth at bedtime.     Biotin 5000 MCG TABS Take 5,000 mcg by mouth at bedtime.     ciprofloxacin-dexamethasone (CIPRODEX) OTIC suspension Place 4 drops into both ears 3 (three) times daily. 7.5 mL 0   doxycycline (VIBRAMYCIN) 100 MG capsule Take 1 capsule (100 mg total) by mouth 2 (two) times daily for 7 days. 14 capsule 0   fluticasone (FLONASE) 50 MCG/ACT nasal spray Place 2 sprays into both nostrils daily. At bedtime     lansoprazole (PREVACID) 15 MG capsule Take 15 mg by mouth 2 (two) times daily before a meal.     montelukast (SINGULAIR) 10 MG tablet Take 10 mg by mouth at bedtime.     Omega-3 Fatty Acids (FISH OIL PO) Take 1 tablet by mouth at bedtime.     oxyCODONE (OXY  IR/ROXICODONE) 5 MG immediate release tablet Take 1 tablet (5 mg total) by mouth every 6 (six) hours as needed for severe pain. 4 tablet 0   oxycodone (OXY-IR) 5 MG capsule Take 5 mg by mouth every 4 (four) hours as needed.     UBRELVY 100 MG TABS Take 100 mg by mouth daily.     No current facility-administered medications for this visit.    Review of Systems  Constitutional: positive for fatigue Eyes: negative Ears, nose, mouth, throat, and face: negative Respiratory: positive for cough Cardiovascular: negative Gastrointestinal: negative Genitourinary:negative Integument/breast: negative Hematologic/lymphatic: negative Musculoskeletal:negative Neurological: negative Behavioral/Psych: positive for anxiety Endocrine: negative Allergic/Immunologic: negative  Physical Exam  GLO:VFIEP, healthy, no distress, well nourished, well developed, and  anxious SKIN: skin color, texture, turgor are normal, no rashes or significant lesions HEAD: Normocephalic, No masses, lesions, tenderness or abnormalities EYES: normal, PERRLA, Conjunctiva are pink and non-injected EARS: External ears normal, Canals clear OROPHARYNX:no exudate, no erythema, and lips, buccal mucosa, and tongue normal  NECK: supple, no adenopathy, no JVD LYMPH:  no palpable lymphadenopathy, no hepatosplenomegaly BREAST:not examined LUNGS: clear to auscultation , and palpation HEART: regular rate & rhythm, no murmurs, and no gallops ABDOMEN:abdomen soft, non-tender, normal bowel sounds, and no masses or organomegaly BACK: Back symmetric, no curvature., No CVA tenderness, Range of motion is normal EXTREMITIES:no joint deformities, effusion, or inflammation, no edema  NEURO: alert & oriented x 3 with fluent speech, no focal motor/sensory deficits  PERFORMANCE STATUS: ECOG 1  LABORATORY DATA: Lab Results  Component Value Date   WBC 8.2 12/01/2020   HGB 13.5 12/01/2020   HCT 39.5 12/01/2020   MCV 96.1 12/01/2020   PLT  358 12/01/2020      Chemistry      Component Value Date/Time   NA 136 11/27/2020 1210   K 3.6 11/27/2020 1210   CL 101 11/27/2020 1210   CO2 26 11/27/2020 1210   BUN 6 11/27/2020 1210   CREATININE 0.61 11/27/2020 1210      Component Value Date/Time   CALCIUM 9.2 11/27/2020 1210   ALKPHOS 116 04/03/2013 1418   AST 25 04/03/2013 1418   ALT 38 (H) 04/03/2013 1418   BILITOT 0.6 04/03/2013 1418       RADIOGRAPHIC STUDIES: DG Chest 2 View  Result Date: 11/27/2020 CLINICAL DATA:  Cough, chest pain EXAM: CHEST - 2 VIEW COMPARISON:  04/03/2013 FINDINGS: The heart size and mediastinal contours are within normal limits. No acute appearing airspace opacity. Nodular opacity projects over the right midlung, measuring 2.7 cm in projection. The visualized skeletal structures are unremarkable. IMPRESSION: 1. No acute appearing airspace opacity. 2. Nodular opacity projects over the right midlung, measuring 2.7 cm in projection. Recommend CT to further evaluate. Electronically Signed   By: Delanna Ahmadi M.D.   On: 11/27/2020 13:00   CT Angio Chest PE W and/or Wo Contrast  Result Date: 11/27/2020 CLINICAL DATA:  Chest pain.  PE suspected, high prob EXAM: CT ANGIOGRAPHY CHEST WITH CONTRAST TECHNIQUE: Multidetector CT imaging of the chest was performed using the standard protocol during bolus administration of intravenous contrast. Multiplanar CT image reconstructions and MIPs were obtained to evaluate the vascular anatomy. CONTRAST:  47mL OMNIPAQUE IOHEXOL 350 MG/ML SOLN COMPARISON:  None. FINDINGS: Cardiovascular: No filling defects in the pulmonary arteries to suggest pulmonary emboli. Heart is normal size. Aorta is normal caliber. Mediastinum/Nodes: Bulky mediastinal adenopathy. Right paratracheal lymph node has a short axis diameter of 20 mm. Subcarinal adenopathy has a short axis diameter of 17 mm. Bulky right hilar adenopathy with conglomerate mass having a short axis diameter of 24 mm.  Lungs/Pleura: 11 mm nodular area in the right upper lobe on image 53 of series 6. There is a tubular soft tissue structure leading to the nodule, presumably dilated airway with mucous plugging or plugging with tumor/debris. No effusions. Left lung clear. Upper Abdomen: Imaging into the upper abdomen demonstrates no acute findings. Musculoskeletal: Chest wall soft tissues are unremarkable. No acute bony abnormality. Review of the MIP images confirms the above findings. IMPRESSION: 11 mm spiculated nodule in the right upper lobe concerning for malignancy. Associated bulky right hilar and mediastinal adenopathy. Dilated tubular structure leads to the right upper lobe nodule, presumably dilated airway with  mucous plugging or plugging with tumor/debris. No evidence of pulmonary embolus. Electronically Signed   By: Rolm Baptise M.D.   On: 11/27/2020 18:21    ASSESSMENT: This is a very pleasant 55 years old white female with highly suspicious stage IIIa (T1b, N2, MX) lung cancer pending tissue diagnosis as well as staging work-up and presented with right upper lobe nodule in addition to bulky right hilar and mediastinal lymphadenopathy   PLAN: I had a lengthy discussion with the patient and her family today about her current condition and further investigation to confirm her diagnosis. I recommended for the patient to complete the staging work-up and I ordered a PET scan as well as MRI of the brain to rule out any metastatic disease. The patient has an appointment with Dr. Lamonte Sakai in few days for evaluation and discussion of bronchoscopy with endobronchial ultrasound and biopsy for tissue confirmation. I will see the patient back for follow-up visit in around 2 weeks for evaluation and more detailed discussion of her treatment options based on the final pathology and staging work-up. For the smoking cessation, I strongly encouraged the patient to quit smoking. The patient was advised to call immediately if she has  any other concerning symptoms in the interval. The patient voices understanding of current disease status and treatment options and is in agreement with the current care plan.  All questions were answered. The patient knows to call the clinic with any problems, questions or concerns. We can certainly see the patient much sooner if necessary.  Thank you so much for allowing me to participate in the care of [employer]. I will continue to follow up the patient with you and assist in her care.  The total time spent in the appointment was 60 minutes.  Disclaimer: This note was dictated with voice recognition software. Similar sounding words can inadvertently be transcribed and may not be corrected upon review.   Eilleen Kempf December 01, 2020, 2:33 PM

## 2020-12-01 NOTE — Patient Instructions (Signed)
Steps to Quit Smoking Smoking tobacco is the leading cause of preventable death. It can affect almost every organ in the body. Smoking puts you and people around you at risk for many serious, long-lasting (chronic) diseases. Quitting smoking can be hard, but it is one of the best things that you can do for your health. It is never too late to quit. How do I get ready to quit? When you decide to quit smoking, make a plan to help you succeed. Before you quit: Pick a date to quit. Set a date within the next 2 weeks to give you time to prepare. Write down the reasons why you are quitting. Keep this list in places where you will see it often. Tell your family, friends, and co-workers that you are quitting. Their support is important. Talk with your doctor about the choices that may help you quit. Find out if your health insurance will pay for these treatments. Know the people, places, things, and activities that make you want to smoke (triggers). Avoid them. What first steps can I take to quit smoking? Throw away all cigarettes at home, at work, and in your car. Throw away the things that you use when you smoke, such as ashtrays and lighters. Clean your car. Make sure to empty the ashtray. Clean your home, including curtains and carpets. What can I do to help me quit smoking? Talk with your doctor about taking medicines and seeing a counselor at the same time. You are more likely to succeed when you do both. If you are pregnant or breastfeeding, talk with your doctor about counseling or other ways to quit smoking. Do not take medicine to help you quit smoking unless your doctor tells you to do so. To quit smoking: Quit right away Quit smoking totally, instead of slowly cutting back on how much you smoke over a period of time. Go to counseling. You are more likely to quit if you go to counseling sessions regularly. Take medicine You may take medicines to help you quit. Some medicines need a  prescription, and some you can buy over-the-counter. Some medicines may contain a drug called nicotine to replace the nicotine in cigarettes. Medicines may: Help you to stop having the desire to smoke (cravings). Help to stop the problems that come when you stop smoking (withdrawal symptoms). Your doctor may ask you to use: Nicotine patches, gum, or lozenges. Nicotine inhalers or sprays. Non-nicotine medicine that is taken by mouth. Find resources Find resources and other ways to help you quit smoking and remain smoke-free after you quit. These resources are most helpful when you use them often. They include: Online chats with a counselor. Phone quitlines. Printed self-help materials. Support groups or group counseling. Text messaging programs. Mobile phone apps. Use apps on your mobile phone or tablet that can help you stick to your quit plan. There are many free apps for mobile phones and tablets as well as websites. Examples include Quit Guide from the CDC and smokefree.gov  What things can I do to make it easier to quit?  Talk to your family and friends. Ask them to support and encourage you. Call a phone quitline (1-800-QUIT-NOW), reach out to support groups, or work with a counselor. Ask people who smoke to not smoke around you. Avoid places that make you want to smoke, such as: Bars. Parties. Smoke-break areas at work. Spend time with people who do not smoke. Lower the stress in your life. Stress can make you want to   smoke. Try these things to help your stress: Getting regular exercise. Doing deep-breathing exercises. Doing yoga. Meditating. Doing a body scan. To do this, close your eyes, focus on one area of your body at a time from head to toe. Notice which parts of your body are tense. Try to relax the muscles in those areas. How will I feel when I quit smoking? Day 1 to 3 weeks Within the first 24 hours, you may start to have some problems that come from quitting tobacco.  These problems are very bad 2-3 days after you quit, but they do not often last for more than 2-3 weeks. You may get these symptoms: Mood swings. Feeling restless, nervous, angry, or annoyed. Trouble concentrating. Dizziness. Strong desire for high-sugar foods and nicotine. Weight gain. Trouble pooping (constipation). Feeling like you may vomit (nausea). Coughing or a sore throat. Changes in how the medicines that you take for other issues work in your body. Depression. Trouble sleeping (insomnia). Week 3 and afterward After the first 2-3 weeks of quitting, you may start to notice more positive results, such as: Better sense of smell and taste. Less coughing and sore throat. Slower heart rate. Lower blood pressure. Clearer skin. Better breathing. Fewer sick days. Quitting smoking can be hard. Do not give up if you fail the first time. Some people need to try a few times before they succeed. Do your best to stick to your quit plan, and talk with your doctor if you have any questions or concerns. Summary Smoking tobacco is the leading cause of preventable death. Quitting smoking can be hard, but it is one of the best things that you can do for your health. When you decide to quit smoking, make a plan to help you succeed. Quit smoking right away, not slowly over a period of time. When you start quitting, seek help from your doctor, family, or friends. This information is not intended to replace advice given to you by your health care provider. Make sure you discuss any questions you have with your health care provider. Document Revised: 10/13/2018 Document Reviewed: 04/08/2018 Elsevier Patient Education  2022 Elsevier Inc.  

## 2020-12-01 NOTE — Progress Notes (Signed)
I spoke to Mr. Julie Nicholson and Julie Nicholson family today at Julie Nicholson first visit with Julie Nicholson. Patient has lung mass without tissue diagnosis. She has an appt with Julie Nicholson next week.  Julie Nicholson has ordered PET and MRI brain.  I explained to Julie Nicholson next steps with scans and bx.  I asked that she call me with any questions.

## 2020-12-02 ENCOUNTER — Encounter: Payer: Self-pay | Admitting: *Deleted

## 2020-12-02 NOTE — Progress Notes (Signed)
I followed up on insurance auth for Julie Nicholson scans. I did not see that they have been authorized so I reached out to the [facility] coordinator to get an update.

## 2020-12-03 ENCOUNTER — Telehealth: Payer: Self-pay | Admitting: Internal Medicine

## 2020-12-03 NOTE — Telephone Encounter (Signed)
Scheduled follow-up appointment per 10/31 los. Patient is aware.

## 2020-12-09 ENCOUNTER — Other Ambulatory Visit: Payer: Self-pay

## 2020-12-09 ENCOUNTER — Encounter: Payer: Self-pay | Admitting: Emergency Medicine

## 2020-12-09 ENCOUNTER — Ambulatory Visit (INDEPENDENT_AMBULATORY_CARE_PROVIDER_SITE_OTHER): Payer: Commercial Managed Care - PPO | Admitting: Emergency Medicine

## 2020-12-09 VITALS — BP 116/72 | HR 78 | Temp 98.2°F | Ht 62.0 in | Wt 101.6 lb

## 2020-12-09 DIAGNOSIS — R590 Localized enlarged lymph nodes: Secondary | ICD-10-CM | POA: Diagnosis not present

## 2020-12-09 DIAGNOSIS — R918 Other nonspecific abnormal finding of lung field: Secondary | ICD-10-CM

## 2020-12-09 DIAGNOSIS — Z720 Tobacco use: Secondary | ICD-10-CM

## 2020-12-09 HISTORY — DX: Tobacco use: Z72.0

## 2020-12-09 MED ORDER — BUPROPION HCL ER (SR) 150 MG PO TB12: ORAL_TABLET | ORAL | 0 refills | Status: DC

## 2020-12-09 NOTE — Assessment & Plan Note (Signed)
She is interested in quitting. Discussed her goals and strategies for cessation. She is interested in trying bupropion, so I will order for her.

## 2020-12-09 NOTE — H&P (View-Only) (Signed)
Subjective:    Patient ID: Julie Nicholson, female    DOB: [birth year], 55 y.o.   MRN: [id number]  HPI 55 year old woman with history of tobacco use (30 pack years), allergic rhinitis/sinusitis, hyperlipidemia, GERD.  Also with a history of COPD not on maintenance therapy. She is referred today for evaluation of an abnormal CT scan of the chest.  She was in the emergency department 11/27/2020 with chest discomfort.  She describes this as associated with cough after a URI. Was treated with steroids and abx.  She was evaluated in the emergency department 11/27/2020 and CT chest was abnormal as below with bulky mediastinal hilar adenopathy, 11 mm right upper lobe nodule.  She saw oncology on 10/31, discussed the concern for possible primary lung cancer.  A PET scan and MRI brain were both ordered.  Cough is better. No blood. Clear mucous - all improved. Uses her albuterol very rarely.   CT-PA 11/27/2020 reviewed by me showed bulky mediastinal adenopathy, right paratracheal lymphadenopathy, subcarinal lymphadenopathy.  There is an 11 mm right upper lobe nodular area with some tubular soft tissue leading to the nodule, question mucous plugging or debris.  No evidence of pulmonary embolism    Review of Systems As per HPI  Past Medical History:  Diagnosis Date   Abdominal discomfort    Chronic headaches    due to allergies, sinus   COPD (chronic obstructive pulmonary disease) ([place])    per 2012 chest xray   pt states she doesn not have this now (04/10/2013)   Deviated nasal septum    Eustachian tube dysfunction    GERD (gastroesophageal reflux disease)    occasional uses Tums / Rolaids   Hearing loss    right ear   High cholesterol    Migraine    "only once in a blue moon since RX'd allergy shots" (04/10/2013)   Pancreatitis 02/08/2013   Rhinitis, allergic      Family History  Problem Relation Age of Onset   Diabetes Father    Diabetes Mother    COPD Mother    COPD Maternal  Grandfather    COPD Maternal Aunt    COPD Maternal Uncle      Social History   Socioeconomic History   Marital status: Married    Spouse name: Not on file   Number of children: Not on file   Years of education: Not on file   Highest education level: Not on file  Occupational History   Not on file  Tobacco Use   Smoking status: Every Day    Packs/day: 0.25    Years: 33.00    Pack years: 8.25    Types: Cigarettes   Smokeless tobacco: Never   Tobacco comments:    4 cigarettes smoked daily [facility]J 12/09/20  Vaping Use   Vaping Use: Never used  Substance and Sexual Activity   Alcohol use: Not Currently    Comment: only alcohol use twice  per year (04/10/2013)   Drug use: No   Sexual activity: Yes  Other Topics Concern   Not on file  Social History Narrative   Not on file   Social Determinants of Health   Financial Resource Strain: Not on file  Food Insecurity: Not on file  Transportation Needs: Not on file  Physical Activity: Not on file  Stress: Not on file  Social Connections: Not on file  Intimate Partner Violence: Not on file     Allergies  Allergen Reactions   No Known  Allergies      Outpatient Medications Prior to Visit  Medication Sig Dispense Refill   albuterol (VENTOLIN HFA) 108 (90 Base) MCG/ACT inhaler Inhale 1-2 puffs into the lungs every 6 (six) hours as needed for wheezing or shortness of breath. 6.7 g 0   aspirin EC 81 MG tablet Take 81 mg by mouth at bedtime.     atorvastatin (LIPITOR) 40 MG tablet Take 40 mg by mouth at bedtime.     Biotin 5000 MCG TABS Take 5,000 mcg by mouth at bedtime.     fluticasone (FLONASE) 50 MCG/ACT nasal spray Place 2 sprays into both nostrils daily. At bedtime     guaiFENesin (MUCINEX) 600 MG 12 hr tablet Take 600 mg by mouth 2 (two) times daily.     oxycodone (OXY-IR) 5 MG capsule Take 5 mg by mouth every 4 (four) hours as needed.     UBRELVY 100 MG TABS Take 100 mg by mouth daily.     No facility-administered  medications prior to visit.        Objective:   Physical Exam Vitals:   12/09/20 0939  BP: 116/72  Pulse: 78  Temp: 98.2 F (36.8 C)  TempSrc: Oral  SpO2: 97%  Weight: 101 lb 9.6 oz (46.1 kg)  Height: 5\' 2"  (1.575 m)    Gen: Pleasant, well-nourished, in no distress,  normal affect  ENT: No lesions,  mouth clear,  oropharynx clear, no postnasal drip  Neck: No JVD, no stridor  Lungs: No use of accessory muscles, no crackles or wheezing on normal respiration, no wheeze on forced expiration  Cardiovascular: RRR, heart sounds normal, no murmur or gallops, no peripheral edema  Musculoskeletal: No deformities, no cyanosis or clubbing  Neuro: alert, awake, non focal  Skin: Warm, no lesions or rash       Assessment & Plan:   Mass of upper lobe of right lung Bulky mediastinal LAD and associated RUL nodule, concerning for primary malignancy. Discussed options with pt, have recommended bronch + EBUS and possible nav to pulm nodule. She agrees. Will work on arranging.   We will work on arranging bronchoscopy.  This we done under general anesthesia at [facility] endoscopy.  We will try to get this scheduled for 12/15/2020.  You will need a designated driver. Please stop your aspirin 2 days prior to the procedure. Follow with Dr Lamonte Sakai in 1 month  Tobacco use She is interested in quitting. Discussed her goals and strategies for cessation. She is interested in trying bupropion, so I will order for her.     Baltazar Apo, MD, PhD 12/09/2020, 7:00 PM [facility] [phone] or if no answer before 7:00PM call [phone] For any issues after 7:00PM please call eLink [phone]

## 2020-12-09 NOTE — Patient Instructions (Signed)
We will work on arranging bronchoscopy.  This we done under general anesthesia at [facility] endoscopy.  We will try to get this scheduled for 12/15/2020.  You will need a designated driver. Please stop your aspirin 2 days prior to the procedure. We will prescribe the bupropion starter pack.  You will start it 7 days prior to your cigarette quit date. Follow with Dr Lamonte Sakai in 1 month

## 2020-12-09 NOTE — Assessment & Plan Note (Signed)
Bulky mediastinal LAD and associated RUL nodule, concerning for primary malignancy. Discussed options with pt, have recommended bronch + EBUS and possible nav to pulm nodule. She agrees. Will work on arranging.   We will work on arranging bronchoscopy.  This we done under general anesthesia at [facility] endoscopy.  We will try to get this scheduled for 12/15/2020.  You will need a designated driver. Please stop your aspirin 2 days prior to the procedure. Follow with Dr Lamonte Sakai in 1 month

## 2020-12-09 NOTE — Progress Notes (Signed)
Subjective:    Patient ID: Julie Nicholson, female    DOB: Apr 09, 1965, 55 y.o.   MRN: [id number]  HPI 55 year old woman with history of tobacco use (30 pack years), allergic rhinitis/sinusitis, hyperlipidemia, GERD.  Also with a history of COPD not on maintenance therapy. She is referred today for evaluation of an abnormal CT scan of the chest.  She was in the emergency department 11/27/2020 with chest discomfort.  She describes this as associated with cough after a URI. Was treated with steroids and abx.  She was evaluated in the emergency department 11/27/2020 and CT chest was abnormal as below with bulky mediastinal hilar adenopathy, 11 mm right upper lobe nodule.  She saw oncology on 10/31, discussed the concern for possible primary lung cancer.  A PET scan and MRI brain were both ordered.  Cough is better. No blood. Clear mucous - all improved. Uses her albuterol very rarely.   CT-PA 11/27/2020 reviewed by me showed bulky mediastinal adenopathy, right paratracheal lymphadenopathy, subcarinal lymphadenopathy.  There is an 11 mm right upper lobe nodular area with some tubular soft tissue leading to the nodule, question mucous plugging or debris.  No evidence of pulmonary embolism    Review of Systems As per HPI  Past Medical History:  Diagnosis Date   Abdominal discomfort    Chronic headaches    due to allergies, sinus   COPD (chronic obstructive pulmonary disease) ([place])    per 2012 chest xray   pt states she doesn not have this now (04/10/2013)   Deviated nasal septum    Eustachian tube dysfunction    GERD (gastroesophageal reflux disease)    occasional uses Tums / Rolaids   Hearing loss    right ear   High cholesterol    Migraine    "only once in a blue moon since RX'd allergy shots" (04/10/2013)   Pancreatitis 02/08/2013   Rhinitis, allergic      Family History  Problem Relation Age of Onset   Diabetes Father    Diabetes Mother    COPD Mother    COPD Maternal  Grandfather    COPD Maternal Aunt    COPD Maternal Uncle      Social History   Socioeconomic History   Marital status: Married    Spouse name: Not on file   Number of children: Not on file   Years of education: Not on file   Highest education level: Not on file  Occupational History   Not on file  Tobacco Use   Smoking status: Every Day    Packs/day: 0.25    Years: 33.00    Pack years: 8.25    Types: Cigarettes   Smokeless tobacco: Never   Tobacco comments:    4 cigarettes smoked daily [facility]J 12/09/20  Vaping Use   Vaping Use: Never used  Substance and Sexual Activity   Alcohol use: Not Currently    Comment: only alcohol use twice  per year (04/10/2013)   Drug use: No   Sexual activity: Yes  Other Topics Concern   Not on file  Social History Narrative   Not on file   Social Determinants of Health   Financial Resource Strain: Not on file  Food Insecurity: Not on file  Transportation Needs: Not on file  Physical Activity: Not on file  Stress: Not on file  Social Connections: Not on file  Intimate Partner Violence: Not on file     Allergies  Allergen Reactions   No Known  Allergies      Outpatient Medications Prior to Visit  Medication Sig Dispense Refill   albuterol (VENTOLIN HFA) 108 (90 Base) MCG/ACT inhaler Inhale 1-2 puffs into the lungs every 6 (six) hours as needed for wheezing or shortness of breath. 6.7 g 0   aspirin EC 81 MG tablet Take 81 mg by mouth at bedtime.     atorvastatin (LIPITOR) 40 MG tablet Take 40 mg by mouth at bedtime.     Biotin 5000 MCG TABS Take 5,000 mcg by mouth at bedtime.     fluticasone (FLONASE) 50 MCG/ACT nasal spray Place 2 sprays into both nostrils daily. At bedtime     guaiFENesin (MUCINEX) 600 MG 12 hr tablet Take 600 mg by mouth 2 (two) times daily.     oxycodone (OXY-IR) 5 MG capsule Take 5 mg by mouth every 4 (four) hours as needed.     UBRELVY 100 MG TABS Take 100 mg by mouth daily.     No facility-administered  medications prior to visit.        Objective:   Physical Exam Vitals:   12/09/20 0939  BP: 116/72  Pulse: 78  Temp: 98.2 F (36.8 C)  TempSrc: Oral  SpO2: 97%  Weight: 101 lb 9.6 oz (46.1 kg)  Height: 5\' 2"  (1.575 m)    Gen: Pleasant, well-nourished, in no distress,  normal affect  ENT: No lesions,  mouth clear,  oropharynx clear, no postnasal drip  Neck: No JVD, no stridor  Lungs: No use of accessory muscles, no crackles or wheezing on normal respiration, no wheeze on forced expiration  Cardiovascular: RRR, heart sounds normal, no murmur or gallops, no peripheral edema  Musculoskeletal: No deformities, no cyanosis or clubbing  Neuro: alert, awake, non focal  Skin: Warm, no lesions or rash       Assessment & Plan:   Mass of upper lobe of right lung Bulky mediastinal LAD and associated RUL nodule, concerning for primary malignancy. Discussed options with pt, have recommended bronch + EBUS and possible nav to pulm nodule. She agrees. Will work on arranging.   We will work on arranging bronchoscopy.  This we done under general anesthesia at [facility] endoscopy.  We will try to get this scheduled for 12/15/2020.  You will need a designated driver. Please stop your aspirin 2 days prior to the procedure. Follow with Dr Lamonte Sakai in 1 month  Tobacco use She is interested in quitting. Discussed her goals and strategies for cessation. She is interested in trying bupropion, so I will order for her.     Baltazar Apo, MD, PhD 12/09/2020, 7:00 PM [facility] [phone] or if no answer before 7:00PM call [phone] For any issues after 7:00PM please call eLink [phone]

## 2020-12-12 ENCOUNTER — Other Ambulatory Visit: Payer: Self-pay

## 2020-12-12 ENCOUNTER — Ambulatory Visit (HOSPITAL_COMMUNITY): Payer: Commercial Managed Care - PPO

## 2020-12-12 ENCOUNTER — Encounter (HOSPITAL_COMMUNITY)
Admission: RE | Admit: 2020-12-12 | Discharge: 2020-12-12 | Disposition: A | Discharge status: Home / Self Care | Payer: Commercial Managed Care - PPO | Source: Ambulatory Visit | Attending: Internal Medicine | Admitting: Internal Medicine

## 2020-12-12 ENCOUNTER — Encounter (HOSPITAL_COMMUNITY): Payer: Self-pay | Admitting: Emergency Medicine

## 2020-12-12 DIAGNOSIS — R918 Other nonspecific abnormal finding of lung field: Secondary | ICD-10-CM | POA: Diagnosis not present

## 2020-12-12 LAB — GLUCOSE, CAPILLARY: Glucose-Capillary: 102 mg/dL — ABNORMAL HIGH (ref 70–99)

## 2020-12-12 MED ORDER — FLUDEOXYGLUCOSE F - 18 (FDG) INJECTION
5.1000 | Freq: Once | INTRAVENOUS | Status: AC
  Administered 2020-12-12: 5.43 via INTRAVENOUS

## 2020-12-12 NOTE — Progress Notes (Signed)
Anesthesia Chart Review: Kathleene Hazel     Case: [id number] Date/Time: 12/15/20 0730   Procedure: ROBOTIC VIDEO BRONCHOSCOPY WITH ENDOBRONCHIAL ULTRASOUND   Anesthesia type: General   Pre-op diagnosis: RIGHT UPPER NODE NODULE   Location: [facility] / [facility]   Surgeons: Collene Gobble, MD       DISCUSSION: Patient is a 55 year old female scheduled for the above procedure. She presented to a [facility] ED on 11/27/20 with URI symptoms for the past several days, previously treated with prednisone +/- antibiotics, with acute onset of right chest pain, Troponin negative x2 and "pain very atypical for ACS." COVID and flu tests negative. Xray showed a right pulmonary nodule, but no infiltrate. CTA chest done and confirmed 11 mm spiculated RUL nodule with associated right hilar mediastinal adenopathy, dilated tubular structure leading to RUL nodule thought to represent dilated airway from plugging from mucous/tumor/or debris. No PE. 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 She was referred for urgent out-patient pulmonology and oncology evaluations and has since seen Dr. Julien Nordmann and Dr. Lamonte Sakai.  The above procedure recommended for definitve diagnosis. She is also getting a PET scan on 12/12/20 and brain MRI 12/17/20 to evaluate for metastatic disease.   Other history includes smoking, COPD, hypercholesterolemia, GERD, hysterectomy, cholecystectomy (04/10/13).   Dr. Lamonte Sakai has instructed her to hold aspirin for 2 days prior to procedure.  He is also prescribing bupropion as she expressed interest in quitting smoking.  She is a same-day work-up, so anesthesia team to evaluate on the day of surgery.  She had labs including c-Met and CBC on 12/01/2020. Notes indicate that he is getting preoperative COVID-19 testing on the day of surgery.   VS: Ht _0  (1.549 m)   Wt 45.8 kg   BMI 19.08 kg/m  BP Readings from Last 3 Encounters:  12/09/20 116/72  12/01/20 128/64  11/27/20 (!)  146/73   Pulse Readings from Last 3 Encounters:  12/09/20 78  12/01/20 87  11/27/20 99     PROVIDERS: [name], NP is PCP  Curt Bears, MD is HEM-ONC   LABS: Most recent lab results include: Lab Results  Component Value Date   WBC 8.2 12/01/2020   HGB 13.5 12/01/2020   HCT 39.5 12/01/2020   PLT 358 12/01/2020   GLUCOSE 95 12/01/2020   ALT 32 12/01/2020   AST 31 12/01/2020   NA 139 12/01/2020   K 3.6 12/01/2020   CL 102 12/01/2020   CREATININE 0.69 12/01/2020   BUN 9 12/01/2020   CO2 29 12/01/2020     IMAGES: PET Scan 12/12/20: In process.  CTA chest 11/27/20: IMPRESSION: - 11 mm spiculated nodule in the right upper lobe concerning for malignancy. Associated bulky right hilar and mediastinal adenopathy. - Dilated tubular structure leads to the right upper lobe nodule, presumably dilated airway with mucous plugging or plugging with tumor/debris. - No evidence of pulmonary embolus.  CXR 11/27/20: IMPRESSION: 1. No acute appearing airspace opacity. 2. Nodular opacity projects over the right midlung, measuring 2.7 cm in projection. Recommend CT to further evaluate.      EKG: 11/27/20:  Sinus rhythm with short PR Rightward axis Borderline ECG Confirmed by Nanda Quinton [phone]) on 11/27/2020 4:20:01 PM - Overall, EKG appears stable when compared to 09/13/01 tracing in M[facility].   CV: N/A  Past Medical History:  Diagnosis Date   Abdominal discomfort    Chronic headaches    due to allergies, sinus   COPD (chronic obstructive pulmonary  disease) ([place])    per 2012 chest xray   pt states she doesn not have this now (04/10/2013)   Deviated nasal septum    Eustachian tube dysfunction    GERD (gastroesophageal reflux disease)    occasional uses Tums / Rolaids   Hearing loss    right ear   High cholesterol    Migraine    "only once in a blue moon since RX'd allergy shots" (04/10/2013)   Pancreatitis 02/08/2013   Pneumonia    Rhinitis, allergic      Past Surgical History:  Procedure Laterality Date   ABDOMINAL HYSTERECTOMY  1995   tx endometriosis, both ovaries removed   APPENDECTOMY  late 1990's   CHOLECYSTECTOMY  04/10/2013   CHOLECYSTECTOMY N/A 04/10/2013   Procedure: LAPAROSCOPIC CHOLECYSTECTOMY WITH INTRAOPERATIVE CHOLANGIOGRAM;  Surgeon: Odis Hollingshead, MD;  Location: [facility];  Service: General;  Laterality: N/A;   EUS N/A 02/16/2013   Procedure: UPPER ENDOSCOPIC ULTRASOUND (EUS) LINEAR;  Surgeon: Milus Banister, MD;  Location: [facility];  Service: Endoscopy;  Laterality: N/A;  radial linear   KNEE ARTHROSCOPY Right 1980's   "cartilage OR"   LAPAROSCOPIC ENDOMETRIOSIS FULGURATION  1980's   MYRINGOTOMY WITH TUBE PLACEMENT Right 07/13/2018   Procedure: MYRINGOTOMY WITH TUBE PLACEMENT;  Surgeon: Margaretha Sheffield, MD;  Location: [facility];  Service: ENT;  Laterality: Right;   NASOPHARYNGOSCOPY EUSTATION TUBE BALLOON DILATION Right 07/13/2018   Procedure: NASOPHARYNGOSCOPY EUSTATION TUBE BALLOON DILATION;  Surgeon: Margaretha Sheffield, MD;  Location: [facility];  Service: ENT;  Laterality: Right;   TONSILLECTOMY AND ADENOIDECTOMY  ~ 1980   adenoidectomy   TUBAL LIGATION  ~ [place] Right 07/13/2018   Procedure: OUTFRACTURE TURBINATE;  Surgeon: Margaretha Sheffield, MD;  Location: [facility];  Service: ENT;  Laterality: Right;   WRIST SURGERY Left    w/plate    MEDICATIONS: No current facility-administered medications for this encounter.    albuterol (VENTOLIN HFA) 108 (90 Base) MCG/ACT inhaler   atorvastatin (LIPITOR) 40 MG tablet   Biotin 5000 MCG TABS   buPROPion (WELLBUTRIN SR) 150 MG 12 hr tablet   Ca Carbonate-Mag Hydroxide (ROLAIDS PO)   fluticasone (FLONASE) 50 MCG/ACT nasal spray   oxycodone (OXY-IR) 5 MG capsule   UBRELVY 100 MG TABS   aspirin EC 81 MG tablet   guaiFENesin (MUCINEX) 600 MG 12 hr tablet    fludeoxyglucose F - 18 (FDG) injection 5.1 millicurie      Myra Gianotti, PA-C Surgical Short Stay/Anesthesiology [facility] Phone [phone] [facility] Phone [phone] 12/12/2020 2:43 PM

## 2020-12-12 NOTE — Progress Notes (Signed)
DUE TO COVID-19 ONLY ONE VISITOR IS ALLOWED TO COME WITH YOU AND STAY IN THE WAITING ROOM ONLY DURING PRE OP AND PROCEDURE DAY OF SURGERY.   PCP - [name], NP Cardiologist - n/a Oncology - Dr Curt Bears  Chest x-ray - 11/27/20 (2V) EKG - 11/27/20 Stress Test - n/a ECHO - n/a Cardiac Cath - n/a  ICD Pacemaker/Loop - n/a  Sleep Study -  n/a CPAP - none  Diabetes - n/a  Aspirin Instructions: Follow your surgeon's instructions on when to stop aspirin prior to surgery,  If no instructions were given by your surgeon then you will need to call the office for those instructions.  Anesthesia review: Yes  STOP now taking any Aspirin (unless otherwise instructed by your surgeon), Aleve, Naproxen, Ibuprofen, Motrin, Advil, Goody's, BC's, all herbal medications, fish oil, and all vitamins.   Coronavirus Screening Covid test is scheduled on DOS Do you have any of the following symptoms:  Cough yes/no: No Fever (>100.43F)  yes/no: No Runny nose yes/no: No Sore throat yes/no: No Difficulty breathing/shortness of breath  yes/no: No  Have you traveled in the last 14 days and where? yes/no: No  Patient verbalized understanding of instructions that were given via phone.

## 2020-12-12 NOTE — Anesthesia Preprocedure Evaluation (Addendum)
Anesthesia Evaluation  Patient identified by MRN, date of birth, ID band Patient awake    Reviewed: Allergy & Precautions, H&P , NPO status , Patient's Chart, lab work & pertinent test results  Airway Mallampati: II   Neck ROM: full    Dental   Pulmonary COPD, Current Smoker,  RUL nodule   breath sounds clear to auscultation       Cardiovascular negative cardio ROS   Rhythm:regular Rate:Normal     Neuro/Psych  Headaches,    GI/Hepatic GERD  ,  Endo/Other    Renal/GU      Musculoskeletal   Abdominal   Peds  Hematology   Anesthesia Other Findings   Reproductive/Obstetrics                            Anesthesia Physical Anesthesia Plan  ASA: 2  Anesthesia Plan: General   Post-op Pain Management:    Induction: Intravenous  PONV Risk Score and Plan: 2 and Ondansetron, Dexamethasone, Midazolam and Treatment may vary due to age or medical condition  Airway Management Planned: Oral ETT  Additional Equipment:   Intra-op Plan:   Post-operative Plan: Extubation in OR  Informed Consent: I have reviewed the patients History and Physical, chart, labs and discussed the procedure including the risks, benefits and alternatives for the proposed anesthesia with the patient or authorized representative who has indicated his/her understanding and acceptance.     Dental advisory given  Plan Discussed with: CRNA, Anesthesiologist and Surgeon  Anesthesia Plan Comments: (PAT note written 12/12/2020 by Myra Gianotti, PA-C.  )       Anesthesia Quick Evaluation

## 2020-12-15 ENCOUNTER — Ambulatory Visit (HOSPITAL_COMMUNITY)
Admission: RE | Admit: 2020-12-15 | Discharge: 2020-12-15 | Disposition: A | Discharge status: Home / Self Care | Payer: Commercial Managed Care - PPO | Source: Ambulatory Visit | Attending: Emergency Medicine | Admitting: Emergency Medicine

## 2020-12-15 ENCOUNTER — Encounter (HOSPITAL_COMMUNITY): Payer: Self-pay | Admitting: Emergency Medicine

## 2020-12-15 ENCOUNTER — Ambulatory Visit (HOSPITAL_COMMUNITY): Payer: Commercial Managed Care - PPO

## 2020-12-15 ENCOUNTER — Encounter (HOSPITAL_COMMUNITY)
Admission: RE | Disposition: A | Discharge status: Home / Self Care | Payer: Self-pay | Source: Ambulatory Visit | Attending: Emergency Medicine

## 2020-12-15 ENCOUNTER — Ambulatory Visit (HOSPITAL_COMMUNITY): Payer: Commercial Managed Care - PPO | Admitting: Vascular Surgery

## 2020-12-15 ENCOUNTER — Other Ambulatory Visit: Payer: Self-pay

## 2020-12-15 DIAGNOSIS — R911 Solitary pulmonary nodule: Secondary | ICD-10-CM | POA: Insufficient documentation

## 2020-12-15 DIAGNOSIS — J449 Chronic obstructive pulmonary disease, unspecified: Secondary | ICD-10-CM | POA: Diagnosis not present

## 2020-12-15 DIAGNOSIS — Z20822 Contact with and (suspected) exposure to covid-19: Secondary | ICD-10-CM | POA: Insufficient documentation

## 2020-12-15 DIAGNOSIS — R918 Other nonspecific abnormal finding of lung field: Secondary | ICD-10-CM | POA: Diagnosis not present

## 2020-12-15 DIAGNOSIS — F17210 Nicotine dependence, cigarettes, uncomplicated: Secondary | ICD-10-CM | POA: Insufficient documentation

## 2020-12-15 DIAGNOSIS — R590 Localized enlarged lymph nodes: Secondary | ICD-10-CM

## 2020-12-15 DIAGNOSIS — Z9889 Other specified postprocedural states: Secondary | ICD-10-CM

## 2020-12-15 DIAGNOSIS — Z419 Encounter for procedure for purposes other than remedying health state, unspecified: Secondary | ICD-10-CM

## 2020-12-15 HISTORY — PX: BRONCHIAL BRUSHINGS: SHX5108

## 2020-12-15 HISTORY — PX: VIDEO BRONCHOSCOPY WITH ENDOBRONCHIAL ULTRASOUND: SHX6177

## 2020-12-15 HISTORY — PX: BRONCHIAL NEEDLE ASPIRATION BIOPSY: SHX5106

## 2020-12-15 HISTORY — DX: Pneumonia, unspecified organism: J18.9

## 2020-12-15 HISTORY — PX: ELECTROMAGNETIC NAVIGATION BROCHOSCOPY: SHX5369

## 2020-12-15 HISTORY — DX: Localized enlarged lymph nodes: R59.0

## 2020-12-15 LAB — CBC
HCT: 40.6 % (ref 36.0–46.0)
Hemoglobin: 13.6 g/dL (ref 12.0–15.0)
MCH: 32.7 pg (ref 26.0–34.0)
MCHC: 33.5 g/dL (ref 30.0–36.0)
MCV: 97.6 fL (ref 80.0–100.0)
Platelets: 294 10*3/uL (ref 150–400)
RBC: 4.16 MIL/uL (ref 3.87–5.11)
RDW: 12.8 % (ref 11.5–15.5)
WBC: 7.9 10*3/uL (ref 4.0–10.5)
nRBC: 0 % (ref 0.0–0.2)

## 2020-12-15 LAB — SARS CORONAVIRUS 2 BY RT PCR (HOSPITAL ORDER, PERFORMED IN ~~LOC~~ HOSPITAL LAB): SARS Coronavirus 2: NEGATIVE

## 2020-12-15 SURGERY — BRONCHOSCOPY, WITH EBUS
Anesthesia: General

## 2020-12-15 MED ORDER — ONDANSETRON HCL 4 MG/2ML IJ SOLN: 4.0000 mg | Freq: Four times a day (QID) | INTRAMUSCULAR | Status: DC | PRN

## 2020-12-15 MED ORDER — PROPOFOL 10 MG/ML IV BOLUS
INTRAVENOUS | Status: DC | PRN
  Administered 2020-12-15: 20 mg via INTRAVENOUS
  Administered 2020-12-15: 100 mg via INTRAVENOUS
  Administered 2020-12-15: 20 mg via INTRAVENOUS

## 2020-12-15 MED ORDER — LACTATED RINGERS IV SOLN: INTRAVENOUS | Status: DC

## 2020-12-15 MED ORDER — FENTANYL CITRATE (PF) 100 MCG/2ML IJ SOLN: 25.0000 ug | INTRAMUSCULAR | Status: DC | PRN

## 2020-12-15 MED ORDER — CHLORHEXIDINE GLUCONATE 0.12 % MT SOLN
15.0000 mL | Freq: Once | OROMUCOSAL | Status: AC
  Filled 2020-12-15: qty 15

## 2020-12-15 MED ORDER — DEXAMETHASONE SODIUM PHOSPHATE 10 MG/ML IJ SOLN
INTRAMUSCULAR | Status: DC | PRN
  Administered 2020-12-15: 5 mg via INTRAVENOUS

## 2020-12-15 MED ORDER — CHLORHEXIDINE GLUCONATE 0.12 % MT SOLN
OROMUCOSAL | Status: AC
  Administered 2020-12-15: 15 mL via OROMUCOSAL
  Filled 2020-12-15: qty 15

## 2020-12-15 MED ORDER — OXYCODONE HCL 5 MG/5ML PO SOLN
5.0000 mg | Freq: Once | ORAL | Status: DC | PRN
  Filled 2020-12-15: qty 5

## 2020-12-15 MED ORDER — ROCURONIUM BROMIDE 10 MG/ML (PF) SYRINGE
PREFILLED_SYRINGE | INTRAVENOUS | Status: DC | PRN
  Administered 2020-12-15: 50 mg via INTRAVENOUS
  Administered 2020-12-15: 10 mg via INTRAVENOUS

## 2020-12-15 MED ORDER — MIDAZOLAM HCL 2 MG/2ML IJ SOLN
INTRAMUSCULAR | Status: DC | PRN
  Administered 2020-12-15: 2 mg via INTRAVENOUS

## 2020-12-15 MED ORDER — ONDANSETRON HCL 4 MG/2ML IJ SOLN
INTRAMUSCULAR | Status: DC | PRN
  Administered 2020-12-15: 4 mg via INTRAVENOUS

## 2020-12-15 MED ORDER — FENTANYL CITRATE (PF) 100 MCG/2ML IJ SOLN
INTRAMUSCULAR | Status: DC | PRN
  Administered 2020-12-15: 100 ug via INTRAVENOUS

## 2020-12-15 MED ORDER — SUGAMMADEX SODIUM 200 MG/2ML IV SOLN
INTRAVENOUS | Status: DC | PRN
  Administered 2020-12-15: 200 mg via INTRAVENOUS

## 2020-12-15 MED ORDER — OXYCODONE HCL 5 MG PO TABS
5.0000 mg | ORAL_TABLET | Freq: Once | ORAL | Status: DC | PRN
  Filled 2020-12-15: qty 1

## 2020-12-15 MED ORDER — LIDOCAINE 2% (20 MG/ML) 5 ML SYRINGE
INTRAMUSCULAR | Status: DC | PRN
  Administered 2020-12-15: 40 mg via INTRAVENOUS

## 2020-12-15 NOTE — Op Note (Signed)
Video Bronchoscopy with Robotic Assisted Bronchoscopic Navigation and Endobronchial Ultrasound Procedure Note  Date of Operation: 12/15/2020   Pre-op Diagnosis: Right upper lobe pulmonary nodule, mediastinal adenopathy  Post-op Diagnosis: Same  Surgeon: Baltazar Apo  Assistants: None  Anesthesia: General endotracheal anesthesia  Operation: Flexible video fiberoptic bronchoscopy with robotic assistance and biopsies.  Estimated Blood Loss: Minimal  Complications: None  Indications and History: Julie Nicholson is a 55 y.o. with history of tobacco use.  Found to have an abnormal CT chest which she was evaluated for chest discomfort in the emergency department.  Recommendation was made to achieve tissue diagnosis via robotic assisted navigational bronchoscopy and endobronchial ultrasound with biopsies. The risks, benefits, complications, treatment options and expected outcomes were discussed with the patient.  The possibilities of pneumothorax, pneumonia, reaction to medication, pulmonary aspiration, perforation of a viscus, bleeding, failure to diagnose a condition and creating a complication requiring transfusion or operation were discussed with the patient who freely signed the consent.    Description of Procedure: The patient was seen in the Preoperative Area, was examined and was deemed appropriate to proceed.  The patient was taken to [facility] endoscopy room 3, identified as [name] and the procedure verified as Flexible Video Fiberoptic Bronchoscopy.  A Time Out was held and the above information confirmed.   Prior to the date of the procedure a high-resolution CT scan of the chest was performed. Utilizing ION software program a virtual tracheobronchial tree was generated to allow the creation of distinct navigation pathways to the patient's parenchymal abnormalities. After being taken to the operating room general anesthesia was initiated and the patient  was orally intubated. The  video fiberoptic bronchoscope was introduced via the endotracheal tube and a general inspection was performed which showed normal right and left lung anatomy, aspiration of the bilateral mainstems was completed to remove any remaining secretions. Robotic catheter inserted into patient's endotracheal tube.   Target #1 right upper lobe pulmonary nodule: The distinct navigation pathways prepared prior to this procedure were then utilized to navigate to patient's lesion identified on CT scan. The robotic catheter was secured into place and the vision probe was withdrawn.  Lesion location was approximated using fluoroscopy and radial endobronchial ultrasound for peripheral targeting. Under fluoroscopic guidance transbronchial needle brushings, transbronchial needle biopsies were performed to be sent for cytology and pathology.   The standard scope was then withdrawn and the endobronchial ultrasound was used to identify and characterize the peritracheal, hilar and bronchial lymph nodes. Inspection showed significant enlargement in all of the right-sided nodes. Using real-time ultrasound guidance Wang needle biopsies were take from [place], 7, 10 R nodes and were sent for cytology.    At the end of the procedure a general airway inspection was performed and there was no evidence of active bleeding. The bronchoscope was removed.  The patient tolerated the procedure well. There was no significant blood loss and there were no obvious complications. A post-procedural chest x-ray is pending.  Samples Target #1: 1. Transbronchial needle brushings from right upper lobe pulmonary nodule 2. Transbronchial Wang needle biopsies from right upper lobe pulmonary nodule   EBUS Samples: 1. Wang needle biopsies from 4R node 2. Wang needle biopsies from 7 node 3. Wang needle biopsies from 10 R node   Plans:  The patient will be discharged from the PACU to home when recovered from anesthesia and after chest x-ray is  reviewed. We will review the cytology, pathology and microbiology results with the patient  when they become available. Outpatient followup will be with Dr. Lamonte Sakai.    Baltazar Apo, MD, PhD 12/15/2020, 8:57 AM [facility] [phone] or if no answer before 7:00PM call [phone] For any issues after 7:00PM please call eLink [phone]

## 2020-12-15 NOTE — Transfer of Care (Signed)
Immediate Anesthesia Transfer of Care Note  Patient: Julie Nicholson  Procedure(s) Performed: ROBOTIC VIDEO BRONCHOSCOPY WITH ENDOBRONCHIAL ULTRASOUND BRONCHIAL NEEDLE ASPIRATION BIOPSIES BRONCHIAL BRUSHINGS ELECTROMAGNETIC NAVIGATION BRONCHOSCOPY  Patient Location: PACU  Anesthesia Type:General  Level of Consciousness: awake, alert  and patient cooperative  Airway & Oxygen Therapy: Patient Spontanous Breathing  Post-op Assessment: Report given to RN and Post -op Vital signs reviewed and stable  Post vital signs: Reviewed and stable  Last Vitals:  Vitals Value Taken Time  BP    Temp    Pulse    Resp    SpO2      Last Pain:  Vitals:   12/15/20 0624  PainSc: 0-No pain      Patients Stated Pain Goal: 0 (91/36/85 [id number])  Complications: No notable events documented.

## 2020-12-15 NOTE — Progress Notes (Signed)
[place] OFFICE PROGRESS NOTE  [name], NP [place] [id number]  DIAGNOSIS: stage IIIB  (T1b, N3, M0) non-small cell lung cancer, adenocarcinoma she presented with right upper lobe nodule in addition to bulky right hilar, mediastinal, and left supraclavicular lymphadenopathy  PRIOR THERAPY: None   CURRENT THERAPY: Concurrent chemoradiation with carboplatin for an AUC of 2 and paclitaxel 45 mg per metered squared.  First dose expected on 12/29/2020.  INTERVAL HISTORY: Julie Nicholson 55 y.o. female returns to the clinic today for a follow-up visit.  The patient was first seen for consultation on 12/01/2020.  At that point in time, it appeared that the patient had suspicious for early stage III lung cancer.  However, the patient required tissue diagnosis, staging brain MRI, and PET scan.  Patient saw Dr. Lamonte Sakai who arranged for a bronchoscopy and biopsy on 12/15/2020.  Since her last appointment, the patient's been feeling fair.  She works as a Chartered certified accountant in the ICU.  She is also busy taking care of multiple family members.  Her mother is currently undergoing cancer treatment at [facility] under the care of Dr. Benay Spice.  She denies any fevers or chills.  She has occasional sweats at night which has been going on for "a while".  For the last month or so, she estimates that she lost 13 pounds.  She denies any chest pain, shortness of breath, or hemoptysis but reports a very mild baseline cough which is dry.  She has mild occasional right-sided chest wall pain.  She had mild specks of blood in her sputum yesterday which is likely secondary to her bronchoscopy.  Denies any nausea, vomiting, diarrhea, or constipation.  She denies any visual changes or seizures.  She started Wellbutrin for smoking cessation and she is working on cutting back. The patient is here today for evaluation and more detailed discussion about her current condition and recommended  treatment options.   MEDICAL HISTORY: Past Medical History:  Diagnosis Date   Abdominal discomfort    Chronic headaches    due to allergies, sinus   COPD (chronic obstructive pulmonary disease) ([place])    per 2012 chest xray   pt states she doesn not have this now (04/10/2013)   Deviated nasal septum    Eustachian tube dysfunction    GERD (gastroesophageal reflux disease)    occasional uses Tums / Rolaids   Hearing loss    right ear   High cholesterol    Migraine    "only once in a blue moon since RX'd allergy shots" (04/10/2013)   Pancreatitis 02/08/2013   Pneumonia    Rhinitis, allergic     ALLERGIES:  has No Known Allergies.  MEDICATIONS:  Current Outpatient Medications  Medication Sig Dispense Refill   prochlorperazine (COMPAZINE) 10 MG tablet Take 1 tablet (10 mg total) by mouth every 6 (six) hours as needed. 30 tablet 2   albuterol (VENTOLIN HFA) 108 (90 Base) MCG/ACT inhaler Inhale 1-2 puffs into the lungs every 6 (six) hours as needed for wheezing or shortness of breath. 6.7 g 0   aspirin EC 81 MG tablet Take 81 mg by mouth at bedtime. (Patient not taking: No sig reported)     atorvastatin (LIPITOR) 40 MG tablet Take 40 mg by mouth at bedtime.     Biotin 5000 MCG TABS Take 5,000 mcg by mouth at bedtime.     buPROPion (WELLBUTRIN SR) 150 MG 12 hr tablet Take 1 tablet (150 mg total)  by mouth daily for 3 days, THEN 1 tablet (150 mg total) in the morning and at bedtime for 27 days. 57 tablet 0   Ca Carbonate-Mag Hydroxide (ROLAIDS PO) Take 1 tablet by mouth daily.     fluticasone (FLONASE) 50 MCG/ACT nasal spray Place 2 sprays into both nostrils daily as needed for allergies.     oxycodone (OXY-IR) 5 MG capsule Take 5 mg by mouth 3 (three) times daily as needed for pain.     UBRELVY 100 MG TABS Take 100 mg by mouth daily as needed (migraine).     No current facility-administered medications for this visit.    SURGICAL HISTORY:  Past Surgical History:  Procedure  Laterality Date   ABDOMINAL HYSTERECTOMY  1995   tx endometriosis, both ovaries removed   APPENDECTOMY  late 1990's   BRONCHIAL BRUSHINGS  12/15/2020   Procedure: BRONCHIAL BRUSHINGS;  Surgeon: Collene Gobble, MD;  Location: [place];  Service: Cardiopulmonary;;   BRONCHIAL NEEDLE ASPIRATION BIOPSY  12/15/2020   Procedure: BRONCHIAL NEEDLE ASPIRATION BIOPSIES;  Surgeon: Collene Gobble, MD;  Location: [place];  Service: Cardiopulmonary;;   CHOLECYSTECTOMY  04/10/2013   CHOLECYSTECTOMY N/A 04/10/2013   Procedure: LAPAROSCOPIC CHOLECYSTECTOMY WITH INTRAOPERATIVE CHOLANGIOGRAM;  Surgeon: Odis Hollingshead, MD;  Location: [place];  Service: General;  Laterality: N/A;   ELECTROMAGNETIC NAVIGATION BROCHOSCOPY  12/15/2020   Procedure: ELECTROMAGNETIC NAVIGATION BRONCHOSCOPY;  Surgeon: Collene Gobble, MD;  Location[facility] ENDOSCOPY;  Service: Cardiopulmonary;;   EUS N/A 02/16/2013   Procedure: UPPER ENDOSCOPIC ULTRASOUND (EUS) LINEAR;  Surgeon: Milus Banister, MD;  Location: [facility] ENDOSCOPY;  Service: Endoscopy;  Laterality: N/A;  radial linear   KNEE ARTHROSCOPY Right 1980's   "cartilage OR"   LAPAROSCOPIC ENDOMETRIOSIS FULGURATION  1980's   MYRINGOTOMY WITH TUBE PLACEMENT Right 07/13/2018   Procedure: MYRINGOTOMY WITH TUBE PLACEMENT;  Surgeon: Margaretha Sheffield, MD;  Location: [place];  Service: ENT;  Laterality: Right;   NASOPHARYNGOSCOPY EUSTATION TUBE BALLOON DILATION Right 07/13/2018   Procedure: NASOPHARYNGOSCOPY EUSTATION TUBE BALLOON DILATION;  Surgeon: Margaretha Sheffield, MD;  Location: [place];  Service: ENT;  Laterality: Right;   TONSILLECTOMY AND ADENOIDECTOMY  ~ 1980   adenoidectomy   TUBAL LIGATION  ~ [place] Right 07/13/2018   Procedure: OUTFRACTURE TURBINATE;  Surgeon: Margaretha Sheffield, MD;  Location: [place];  Service: ENT;  Laterality: Right;   VIDEO BRONCHOSCOPY WITH ENDOBRONCHIAL ULTRASOUND N/A 12/15/2020   Procedure: ROBOTIC  VIDEO BRONCHOSCOPY WITH ENDOBRONCHIAL ULTRASOUND;  Surgeon: Collene Gobble, MD;  Location: [place];  Service: Cardiopulmonary;  Laterality: N/A;   WRIST SURGERY Left    w/plate    REVIEW OF SYSTEMS:   Review of Systems  Constitutional: Negative for appetite change, chills, fatigue, fever and unexpected weight change.  HENT: Negative for mouth sores, nosebleeds, sore throat and trouble swallowing.   Eyes: Negative for eye problems and icterus.  Respiratory: Positive for chronic dry cough.  Negative for hemoptysis, shortness of breath and wheezing.   Cardiovascular: Negative for chest pain and leg swelling.  Gastrointestinal: Negative for abdominal pain, constipation, diarrhea, nausea and vomiting.  Genitourinary: Negative for bladder incontinence, difficulty urinating, dysuria, frequency and hematuria.   Musculoskeletal: Negative for back pain, gait problem, neck pain and neck stiffness.  Skin: Negative for itching and rash.  Neurological: Negative for dizziness, extremity weakness, gait problem, headaches, light-headedness and seizures.  Hematological: Negative for adenopathy. Does not bruise/bleed easily.  Psychiatric/Behavioral: Negative for confusion, depression and sleep  disturbance. The patient is not nervous/anxious.     PHYSICAL EXAMINATION:  Blood pressure (!) 115/52, pulse 72, temperature 97.6 F (36.4 C), temperature source Tympanic, resp. rate 17, weight 101 lb (45.8 kg), SpO2 99 %.  ECOG PERFORMANCE STATUS: 1  Physical Exam  Constitutional: Oriented to person, place, and time and thin appearing female and in no distress.  HENT:  Head: Normocephalic and atraumatic.  Mouth/Throat: Oropharynx is clear and moist. No oropharyngeal exudate.  Eyes: Conjunctivae are normal. Right eye exhibits no discharge. Left eye exhibits no discharge. No scleral icterus.  Neck: Normal range of motion. Neck supple.  Cardiovascular: Normal rate, regular rhythm, normal heart sounds and  intact distal pulses.   Pulmonary/Chest: Effort normal and breath sounds normal. No respiratory distress. No wheezes. No rales.  Abdominal: Soft. Bowel sounds are normal. Exhibits no distension and no mass. There is no tenderness.  Musculoskeletal: Normal range of motion. Exhibits no edema.  Lymphadenopathy:    No cervical adenopathy.  Neurological: Alert and oriented to person, place, and time. Exhibits normal muscle tone. Gait normal. Coordination normal.  Skin: Skin is warm and dry. No rash noted. Not diaphoretic. No erythema. No pallor.  Psychiatric: Mood, memory and judgment normal.  Vitals reviewed.  LABORATORY DATA: Lab Results  Component Value Date   WBC 7.9 12/15/2020   HGB 13.6 12/15/2020   HCT 40.6 12/15/2020   MCV 97.6 12/15/2020   PLT 294 12/15/2020      Chemistry      Component Value Date/Time   NA 139 12/01/2020 1404   K 3.6 12/01/2020 1404   CL 102 12/01/2020 1404   CO2 29 12/01/2020 1404   BUN 9 12/01/2020 1404   CREATININE 0.69 12/01/2020 1404      Component Value Date/Time   CALCIUM 9.8 12/01/2020 1404   ALKPHOS 161 (H) 12/01/2020 1404   AST 31 12/01/2020 1404   ALT 32 12/01/2020 1404   BILITOT 1.0 12/01/2020 1404       RADIOGRAPHIC STUDIES:  DG Chest 2 View  Result Date: 11/27/2020 CLINICAL DATA:  Cough, chest pain EXAM: CHEST - 2 VIEW COMPARISON:  04/03/2013 FINDINGS: The heart size and mediastinal contours are within normal limits. No acute appearing airspace opacity. Nodular opacity projects over the right midlung, measuring 2.7 cm in projection. The visualized skeletal structures are unremarkable. IMPRESSION: 1. No acute appearing airspace opacity. 2. Nodular opacity projects over the right midlung, measuring 2.7 cm in projection. Recommend CT to further evaluate. Electronically Signed   By: Delanna Ahmadi M.D.   On: 11/27/2020 13:00   CT Angio Chest PE W and/or Wo Contrast  Result Date: 11/27/2020 CLINICAL DATA:  Chest pain.  PE suspected,  high prob EXAM: CT ANGIOGRAPHY CHEST WITH CONTRAST TECHNIQUE: Multidetector CT imaging of the chest was performed using the standard protocol during bolus administration of intravenous contrast. Multiplanar CT image reconstructions and MIPs were obtained to evaluate the vascular anatomy. CONTRAST:  4mL OMNIPAQUE IOHEXOL 350 MG/ML SOLN COMPARISON:  None. FINDINGS: Cardiovascular: No filling defects in the pulmonary arteries to suggest pulmonary emboli. Heart is normal size. Aorta is normal caliber. Mediastinum/Nodes: Bulky mediastinal adenopathy. Right paratracheal lymph node has a short axis diameter of 20 mm. Subcarinal adenopathy has a short axis diameter of 17 mm. Bulky right hilar adenopathy with conglomerate mass having a short axis diameter of 24 mm. Lungs/Pleura: 11 mm nodular area in the right upper lobe on image 53 of series 6. There is a tubular soft tissue structure leading  to the nodule, presumably dilated airway with mucous plugging or plugging with tumor/debris. No effusions. Left lung clear. Upper Abdomen: Imaging into the upper abdomen demonstrates no acute findings. Musculoskeletal: Chest wall soft tissues are unremarkable. No acute bony abnormality. Review of the MIP images confirms the above findings. IMPRESSION: 11 mm spiculated nodule in the right upper lobe concerning for malignancy. Associated bulky right hilar and mediastinal adenopathy. Dilated tubular structure leads to the right upper lobe nodule, presumably dilated airway with mucous plugging or plugging with tumor/debris. No evidence of pulmonary embolus. Electronically Signed   By: Rolm Baptise M.D.   On: 11/27/2020 18:21   MR Brain W Wo Contrast  Result Date: 12/18/2020 CLINICAL DATA:  Lung mass.  Cancer of unknown primary, surveillance EXAM: MRI HEAD WITHOUT AND WITH CONTRAST TECHNIQUE: Multiplanar, multiecho pulse sequences of the brain and surrounding structures were obtained without and with intravenous contrast. CONTRAST:   99mL GADAVIST GADOBUTROL 1 MMOL/ML IV SOLN COMPARISON:  CT head 12/20/2003 FINDINGS: Brain: No acute infarction, hemorrhage, hydrocephalus, extra-axial collection or mass lesion. Negative for metastatic disease.  No enhancing lesions in the brain. Mild white matter changes with scattered small subcortical white matter hyperintensities bilaterally which appear chronic. Vascular: Normal arterial flow voids. Skull and upper cervical spine: Cortical exostosis left lateral occipital bone unchanged from the prior CT 2005. This appears benign. No osseous metastatic disease present. Sinuses/Orbits: Mild mucosal edema right maxillary sinus. Remaining sinuses clear. Negative orbit Other: None IMPRESSION: Negative for metastatic disease to the brain Mild white matter changes likely chronic microvascular ischemia. Electronically Signed   By: Franchot Gallo M.D.   On: 12/18/2020 09:15   NM PET Image Initial (PI) Skull Base To Thigh  Result Date: 12/14/2020 CLINICAL DATA:  Initial treatment strategy for lung nodule. EXAM: NUCLEAR MEDICINE PET SKULL BASE TO THIGH TECHNIQUE: 5.4 mCi F-18 FDG was injected intravenously. Full-ring PET imaging was performed from the skull base to thigh after the radiotracer. CT data was obtained and used for attenuation correction and anatomic localization. Fasting blood glucose: 102 mg/dl COMPARISON:  CT chest dated November 27, 2020 FINDINGS: Mediastinal blood pool activity: SUV max 1.9 Liver activity: SUV max 2.8 NECK: No hypermetabolic lymph nodes in the neck. Incidental CT findings: none CHEST: Right upper lobe solid pulmonary nodule demonstrates hypermetabolic activity with an SUV max of 12.5. Enlarged and hypermetabolic right hilar, subcarinal and prevascular lymph nodes. Reference right lower paratracheal lymph node measures 2.0 cm in short axis on series 4, image 53 with SUV max of 16.4. Reference right hilar lymph node measures 2.4 cm in short axis on image 61 with an SUV max of 17.8.  Hypermetabolic left supraclavicular lymph node measuring 4 mm in short axis with SUV max of 3.8. Incidental CT findings: Mild coronary artery calcifications of the LAD. Upper lobe predominant centrilobular emphysema. ABDOMEN/PELVIS: No abnormal hypermetabolic activity within the liver, pancreas, adrenal glands, or spleen. No hypermetabolic lymph nodes in the abdomen or pelvis. Incidental CT findings: Cholecystectomy clips. Atherosclerotic disease of the abdominal aorta. SKELETON: No focal hypermetabolic activity to suggest skeletal metastasis. Incidental CT findings: none IMPRESSION: 1. Hypermetabolic right upper lobe spiculated nodule, concerning for primary lung malignancy. 2. Hypermetabolic right hilar, mediastinal and left supraclavicular adenopathy, concerning for metastatic disease. 3. Aortic Atherosclerosis (ICD10-I70.0) and Emphysema (ICD10-J43.9). Electronically Signed   By: Yetta Glassman M.D.   On: 12/14/2020 13:29   DG Chest Port 1 View  Result Date: 12/15/2020 CLINICAL DATA:  Status post bronchoscopic biopsy EXAM: PORTABLE CHEST  1 VIEW COMPARISON:  11/27/2020 chest radiograph and chest CT angiogram. FINDINGS: Stable cardiomediastinal silhouette with normal heart size and thickening of the right paratracheal stripe. No pneumothorax. No pleural effusion. Right upper lobe nodular opacity appears unchanged. No superimposed acute consolidative airspace disease. No pulmonary edema. Cholecystectomy clips are seen in the right upper quadrant of the abdomen. IMPRESSION: 1. No pneumothorax. 2. Stable known right upper lobe pulmonary nodule. 3. Stable thickening of the right paratracheal stripe compatible with known right paratracheal lymphadenopathy. Electronically Signed   By: Ilona Sorrel M.D.   On: 12/15/2020 09:32   DG C-ARM BRONCHOSCOPY  Result Date: 12/15/2020 C-ARM BRONCHOSCOPY: Fluoroscopy was utilized by the requesting physician.  No radiographic interpretation.     ASSESSMENT/PLAN:  This  is a very pleasant 55 year old Caucasian female with stage IIIB (T1b, N3, M0) non-small cell lung cancer, adenocarcinoma.  She presented with a right upper lobe spiculated nodule and right hilar, mediastinal, and left supraclavicular adenopathy.  She was diagnosed in November 2022.  There is insufficient tissue for molecular studies.  We will arrange for the patient to have guardant 360 molecular testing performed today.  The patient was seen with Dr. Julien Nordmann today.  Dr. Julien Nordmann personally and independently reviewed the patient's brain MRI and PET scan today.  The patient scan showed a right upper lobe spiculated nodule and right hilar, mediastinal, and supraclavicular adenopathy.  Dr. Julien Nordmann had a lengthy discussion today with the patient about her current condition and recommended treatment options.  Dr. Julien Nordmann recommends that the patient proceed with concurrent chemoradiation with carboplatin for an AUC of 2 and paclitaxel 45 mg per metered square.  The patient is interested in this option and she is expected to start her first treatment on 12/29/2020.  The adverse side effects of treatment were discussed including but not limited to myelosuppression, alopecia, nausea, vomiting, peripheral neuropathy, liver, and kidney dysfunction.   I have placed a referral to radiation oncology.  I will arrange for her a chemo education class prior to starting her first cycle of treatment.  I sent a prescription for Compazine 10 mg p.o. every 6 hours as needed to the patient's pharmacy.  We will see her back for follow-up visit in 2 and half weeks for evaluation and repeat blood work before starting cycle #2.  She will continue with smoking cessation and is taking Wellbutrin  She was given the option to undergo treatment at dropp[facility] under the care of Dr. Benay Spice, which she will continue with our clinic for now since radiation is at the [facility] long location and is daily.  She works as a Quarry manager.  She had  questions regarding short-term disability.  She is going to talk to her employer and let us know if she needs any assistance applying for short-term disability.  The patient was advised to call immediately if she has any concerning symptoms in the interval. The patient voices understanding of current disease status and treatment options and is in agreement with the current care plan. All questions were answered. The patient knows to call the clinic with any problems, questions or concerns. We can certainly see the patient much sooner if necessary      Orders Placed This Encounter  Procedures   CBC with Differential ([place] Only)    Standing Status:   Standing    Number of Occurrences:   6    Standing Expiration Date:   12/18/2021   CMP ([place] only)    Standing Status:  Standing    Number of Occurrences:   6    Standing Expiration Date:   12/18/2021   Guardant 360    Standing Status:   Future    Number of Occurrences:   1    Standing Expiration Date:   12/18/2021   Ambulatory referral to Radiation Oncology    Referral Priority:   Routine    Referral Type:   Consultation    Referral Reason:   Specialty Services Required    Requested Specialty:   Radiation Oncology    Number of Visits Requested:   [place], PA-C 12/18/20  ADDENDUM: Hematology/Oncology Attending: I had a face-to-face encounter with the patient today.  I reviewed her record, lab, scan and recommended her care plan.  This is a very pleasant 56 years old white female recently diagnosed with stage IIIb (T1b, N3, M0) non-small cell lung cancer, adenocarcinoma presented with right upper lobe lung nodule in addition to right hilar and mediastinal as well as left supraclavicular lymphadenopathy diagnosed in November 2022.  There was insufficient material for molecular studies. She had MRI of the brain performed yesterday that showed no concerning findings for disease metastasis to  the brain. I had a lengthy discussion with the patient and her husband today about her current disease stage, prognosis and treatment options. I recommended for the patient a course of concurrent chemoradiation with weekly carboplatin for AUC of 2 and paclitaxel 45 Mg/M2 for 6-7 weeks and this will be followed by restaging scan and treatment with consolidation immunotherapy with Imfinzi every 4 weeks if the patient has no evidence for disease progression after the induction phase. I discussed with the patient the adverse effect of this treatment including but not limited to alopecia, myelosuppression, nausea and vomiting, peripheral neuropathy, liver or renal dysfunction. The patient is interested in the treatment. She will have a chemotherapy education class before the first dose of her treatment. Will call her pharmacy with prescription for Compazine 10 mg p.o. every 6 hours as needed for nausea. She is expected to start the first dose of her treatment on December 29, 2020. We will refer the patient to radiation oncology for evaluation and discussion of the radiotherapy option. The patient will come back for follow-up visit 1 week after the start of her treatment for evaluation and management of any adverse effect of her treatment. She was advised to call immediately if she has any other concerning symptoms in the interval.  The total time spent in the appointment was 40 minutes. Disclaimer: This note was dictated with voice recognition software. Similar sounding words can inadvertently be transcribed and may be missed upon review. Eilleen Kempf, MD 12/18/20

## 2020-12-15 NOTE — Interval H&P Note (Signed)
History and Physical Interval Note:  12/15/2020 7:25 AM  Julie Nicholson  has presented today for surgery, with the diagnosis of RIGHT UPPER NODE NODULE.  The various methods of treatment have been discussed with the patient and family. After consideration of risks, benefits and other options for treatment, the patient has consented to  Procedure(s): ROBOTIC [place] (N/A) as a surgical intervention.  The patient's history has been reviewed, patient examined, no change in status, stable for surgery.  I have reviewed the patient's chart and labs.  Questions were answered to the patient's satisfaction.     Collene Gobble

## 2020-12-15 NOTE — Anesthesia Procedure Notes (Signed)
Procedure Name: Intubation Date/Time: 12/15/2020 7:38 AM Performed by: Janace Litten, CRNA Pre-anesthesia Checklist: Patient identified, Emergency Drugs available, Suction available and Patient being monitored Patient Re-evaluated:Patient Re-evaluated prior to induction Oxygen Delivery Method: Circle System Utilized Preoxygenation: Pre-oxygenation with 100% oxygen Induction Type: IV induction Ventilation: Mask ventilation without difficulty Laryngoscope Size: Mac and 3 Grade View: Grade I Tube type: Oral Tube size: 8.5 mm Number of attempts: 1 Airway Equipment and Method: Stylet and Oral airway Placement Confirmation: ETT inserted through vocal cords under direct vision, positive ETCO2 and breath sounds checked- equal and bilateral Secured at: 21 cm Tube secured with: Tape Dental Injury: Teeth and Oropharynx as per pre-operative assessment

## 2020-12-15 NOTE — Discharge Instructions (Signed)
Flexible Bronchoscopy, Care After This sheet gives you information about how to care for yourself after your test. Your doctor may also give you more specific instructions. If you have problems or questions, contact your doctor. Follow these instructions at home: Eating and drinking Do not eat or drink anything (not even water) for 2 hours after your test, or until your numbing medicine (local anesthetic) wears off. When your numbness is gone and your cough and gag reflexes have come back, you may: Eat only soft foods. Slowly drink liquids. The day after the test, go back to your normal diet. Driving Do not drive for 24 hours if you were given a medicine to help you relax (sedative). Do not drive or use heavy machinery while taking prescription pain medicine. General instructions  Take over-the-counter and prescription medicines only as told by your doctor. Return to your normal activities as told. Ask what activities are safe for you. Do not use any products that have nicotine or tobacco in them. This includes cigarettes and e-cigarettes. If you need help quitting, ask your doctor. Keep all follow-up visits as told by your doctor. This is important. It is very important if you had a tissue sample (biopsy) taken. Get help right away if: You have shortness of breath that gets worse. You get light-headed. You feel like you are going to pass out (faint). You have chest pain. You cough up: More than a little blood. More blood than before. Summary Do not eat or drink anything (not even water) for 2 hours after your test, or until your numbing medicine wears off. Do not use cigarettes. Do not use e-cigarettes. Get help right away if you have chest pain.  Please call our office for any questions or concerns.  [phone]  This information is not intended to replace advice given to you by your health care provider. Make sure you discuss any questions you have with your health care  provider. Document Released: 11/15/2008 Document Revised: 12/31/2016 Document Reviewed: 02/06/2016 Elsevier Patient Education  2020 [employer].

## 2020-12-16 ENCOUNTER — Encounter (HOSPITAL_COMMUNITY): Payer: Self-pay | Admitting: Emergency Medicine

## 2020-12-16 LAB — CYTOLOGY - NON PAP

## 2020-12-16 NOTE — Anesthesia Postprocedure Evaluation (Signed)
Anesthesia Post Note  Patient: Julie Nicholson  Procedure(s) Performed: ROBOTIC VIDEO BRONCHOSCOPY WITH ENDOBRONCHIAL ULTRASOUND BRONCHIAL NEEDLE ASPIRATION BIOPSIES BRONCHIAL BRUSHINGS ELECTROMAGNETIC NAVIGATION BRONCHOSCOPY     Patient location during evaluation: PACU Anesthesia Type: General Level of consciousness: awake and alert Pain management: pain level controlled Vital Signs Assessment: post-procedure vital signs reviewed and stable Respiratory status: spontaneous breathing, nonlabored ventilation, respiratory function stable and patient connected to nasal cannula oxygen Cardiovascular status: blood pressure returned to baseline and stable Postop Assessment: no apparent nausea or vomiting Anesthetic complications: no   No notable events documented.  Last Vitals:  Vitals:   12/15/20 0915 12/15/20 0930  BP: (!) 107/52 98/60  Pulse: 93 89  Resp: 19 14  Temp:  36.9 C  SpO2: 93% 95%    Last Pain:  Vitals:   12/15/20 0930  PainSc: 0-No pain                 Calin Fantroy S

## 2020-12-17 ENCOUNTER — Other Ambulatory Visit: Payer: Self-pay

## 2020-12-17 ENCOUNTER — Ambulatory Visit (HOSPITAL_COMMUNITY)
Admission: RE | Admit: 2020-12-17 | Discharge: 2020-12-17 | Disposition: A | Discharge status: Home / Self Care | Payer: Commercial Managed Care - PPO | Source: Ambulatory Visit | Attending: Internal Medicine | Admitting: Internal Medicine

## 2020-12-17 DIAGNOSIS — R918 Other nonspecific abnormal finding of lung field: Secondary | ICD-10-CM | POA: Diagnosis present

## 2020-12-17 LAB — CYTOLOGY - NON PAP

## 2020-12-17 MED ORDER — GADOBUTROL 1 MMOL/ML IV SOLN
5.0000 mL | Freq: Once | INTRAVENOUS | Status: AC | PRN
  Administered 2020-12-17: 5 mL via INTRAVENOUS

## 2020-12-18 ENCOUNTER — Other Ambulatory Visit: Payer: Self-pay | Admitting: Internal Medicine

## 2020-12-18 ENCOUNTER — Inpatient Hospital Stay: Payer: Commercial Managed Care - PPO | Attending: Internal Medicine | Admitting: Physician Assistant

## 2020-12-18 ENCOUNTER — Inpatient Hospital Stay: Payer: Commercial Managed Care - PPO

## 2020-12-18 ENCOUNTER — Encounter: Payer: Self-pay | Admitting: *Deleted

## 2020-12-18 VITALS — BP 115/52 | HR 72 | Temp 97.6°F | Resp 17 | Wt 101.0 lb

## 2020-12-18 DIAGNOSIS — Z7189 Other specified counseling: Secondary | ICD-10-CM | POA: Diagnosis not present

## 2020-12-18 DIAGNOSIS — Z5111 Encounter for antineoplastic chemotherapy: Secondary | ICD-10-CM

## 2020-12-18 DIAGNOSIS — C3411 Malignant neoplasm of upper lobe, right bronchus or lung: Secondary | ICD-10-CM | POA: Insufficient documentation

## 2020-12-18 DIAGNOSIS — C3491 Malignant neoplasm of unspecified part of right bronchus or lung: Secondary | ICD-10-CM

## 2020-12-18 HISTORY — DX: Malignant neoplasm of unspecified part of right bronchus or lung: C34.91

## 2020-12-18 HISTORY — DX: Encounter for antineoplastic chemotherapy: Z51.11

## 2020-12-18 LAB — CBC WITH DIFFERENTIAL (CANCER CENTER ONLY)
Abs Immature Granulocytes: 0.02 10*3/uL (ref 0.00–0.07)
Basophils Absolute: 0 10*3/uL (ref 0.0–0.1)
Basophils Relative: 0 %
Eosinophils Absolute: 0.1 10*3/uL (ref 0.0–0.5)
Eosinophils Relative: 1 %
HCT: 39.7 % (ref 36.0–46.0)
Hemoglobin: 13.2 g/dL (ref 12.0–15.0)
Immature Granulocytes: 0 %
Lymphocytes Relative: 23 %
Lymphs Abs: 1.7 10*3/uL (ref 0.7–4.0)
MCH: 31.9 pg (ref 26.0–34.0)
MCHC: 33.2 g/dL (ref 30.0–36.0)
MCV: 95.9 fL (ref 80.0–100.0)
Monocytes Absolute: 0.6 10*3/uL (ref 0.1–1.0)
Monocytes Relative: 8 %
Neutro Abs: 5.1 10*3/uL (ref 1.7–7.7)
Neutrophils Relative %: 68 %
Platelet Count: 332 10*3/uL (ref 150–400)
RBC: 4.14 MIL/uL (ref 3.87–5.11)
RDW: 12.4 % (ref 11.5–15.5)
WBC Count: 7.6 10*3/uL (ref 4.0–10.5)
nRBC: 0 % (ref 0.0–0.2)

## 2020-12-18 LAB — CMP (CANCER CENTER ONLY)
ALT: 22 U/L (ref 0–44)
AST: 17 U/L (ref 15–41)
Albumin: 3.5 g/dL (ref 3.5–5.0)
Alkaline Phosphatase: 130 U/L — ABNORMAL HIGH (ref 38–126)
Anion gap: 10 (ref 5–15)
BUN: 8 mg/dL (ref 6–20)
CO2: 28 mmol/L (ref 22–32)
Calcium: 9.7 mg/dL (ref 8.9–10.3)
Chloride: 103 mmol/L (ref 98–111)
Creatinine: 0.8 mg/dL (ref 0.44–1.00)
GFR, Estimated: >60 mL/min (ref >60)
Glucose, Bld: 86 mg/dL (ref 70–99)
Potassium: 4.1 mmol/L (ref 3.5–5.1)
Sodium: 141 mmol/L (ref 135–145)
Total Bilirubin: 0.8 mg/dL (ref 0.3–1.2)
Total Protein: 7.6 g/dL (ref 6.5–8.1)

## 2020-12-18 MED ORDER — PROCHLORPERAZINE MALEATE 10 MG PO TABS
10.0000 mg | ORAL_TABLET | Freq: Four times a day (QID) | ORAL | 2 refills | Status: DC | PRN
Start: 2020-12-18 — End: 2021-09-29

## 2020-12-18 NOTE — Progress Notes (Signed)
START ON PATHWAY REGIMEN - Non-Small Cell Lung     Administer weekly:     Paclitaxel      Carboplatin   **Always confirm dose/schedule in your pharmacy ordering system**  Patient Characteristics: Preoperative or Nonsurgical Candidate (Clinical Staging), Stage III - Nonsurgical Candidate (Nonsquamous and Squamous), PS = 0, 1 Therapeutic Status: Preoperative or Nonsurgical Candidate (Clinical Staging) AJCC T Category: cT1b AJCC N Category: cN3 AJCC M Category: cM0 AJCC 8 Stage Grouping: IIIB ECOG Performance Status: 1 Intent of Therapy: Curative Intent, Discussed with Patient

## 2020-12-18 NOTE — Progress Notes (Signed)
Oncology Nurse Navigator Documentation  Oncology Nurse Navigator Flowsheets 12/18/2020  Abnormal Finding Date 11/27/2020  Confirmed Diagnosis Date 12/15/2020  Diagnosis Status Pending Molecular Studies  Planned Course of Treatment Chemo/Radiation Concurrent  Phase of Treatment Radiation  Navigator Follow Up Date: 12/19/2020  Navigator Follow Up Reason: Appointment Review  Navigator Location [facility]  Navigator Encounter Type Other: I followed up and noticed Julie Nicholson has treatment plan but nothing scheduled yet. I will follow up on schedule.   Treatment Phase Pre-Tx/Tx Discussion  Interventions Other  Acuity Level 2-Minimal Needs (1-2 Barriers Identified)  Time Spent with Patient 15

## 2020-12-18 NOTE — Patient Instructions (Signed)
-  There are two main categories of lung cancer, they are named based on the size of the cancer cell. One is called Non-Small cell lung cancer. The other type is Small Cell Lung Cancer -The sample (biopsy) that they took of your tumor was consistent with a subtype of Non-small cell lung cancer called adenocarcinoma.  -We covered a lot of important information at your appointment today regarding what the treatment plan is moving forward. Here are the the main points that were discussed at your office visit with [place] today:  -The treatment that you will receive consists of two chemotherapy drugs called Carboplatin and Paclitaxel (also called Taxol) -We are planning on starting your treatment next week on 12/29/20 but before your start your treatment, I would like you to attend a Chemotherapy Education Class. This involves having you sit down with one of our nurse educators. She will discuss with you one-on-one more details about your treatment as well as general information about resources here at the [facility] treatment will be given every week for about 6 weeks or so (as long as you are also receiving radiation). We will check your labs (blood work) once a week . Dr. Julien Nordmann or I will see you every other treatment just to make sure that you are doing well and that everything is on track. -We will get a CT scan about 3 weeks after you complete your treatment  Medications:  -Compazine was sent to your pharmacy. This medication is for nausea. You may take this every 6 hours as needed if you feel nausous.   Referrals -I will place the referral to radiation oncology to meet with you to discuss starting radiation treatment. Please be on the lookout for a phone call from them to schedule a consultation.   Follow Up:  -We will see you back for a follow up visit in 2.5 weeks before staring with cycle #2.

## 2020-12-19 ENCOUNTER — Telehealth: Payer: Self-pay | Admitting: Emergency Medicine

## 2020-12-19 NOTE — Progress Notes (Signed)
Location of tumor and Histology per Pathology Report: RUL stage IIIB  (T1b, N3, M0) non-small cell lung cancer, adenocarcinoma   Biopsy:     Past/Anticipated interventions by surgeon, if any:   Surgeon: Baltazar Apo Operation: Flexible video fiberoptic bronchoscopy with robotic assistance and biopsies.  Past/Anticipated interventions by medical oncology, if any: Dr Julien Nordmann PRIOR THERAPY: None    CURRENT THERAPY: Concurrent chemoradiation with carboplatin for an AUC of 2 and paclitaxel 45 mg per metered squared.  First dose expected on 12/29/2020.    Pain issues, if any:  no   SAFETY ISSUES: Prior radiation? no Pacemaker/ICD? no Possible current pregnancy? no Is the patient on methotrexate? no  Current Complaints / other details:  stabbing right sided chest and back pain took patient to the emergency room. Incidentally found abnormality on CT.     Vitals:   12/31/20 1030  BP: 118/68  Pulse: 69  Resp: 18  Temp: (!) 97.5 F (36.4 C)  SpO2: 100%  Weight: 100 lb 9.6 oz (45.6 kg)  Height: 5\' 1"  (1.549 m)

## 2020-12-22 ENCOUNTER — Encounter: Payer: Self-pay | Admitting: *Deleted

## 2020-12-22 NOTE — Progress Notes (Signed)
Oncology Nurse Navigator Documentation  Oncology Nurse Navigator Flowsheets 12/22/2020 12/18/2020  Abnormal Finding Date - 11/27/2020  Confirmed Diagnosis Date - 12/15/2020  Diagnosis Status - Pending Molecular Studies  Planned Course of Treatment - Chemo/Radiation Concurrent  Phase of Treatment Radiation Radiation  Chemotherapy Pending- Reason: (No Data) -  Navigator Follow Up Date: 12/23/2020 12/19/2020  Navigator Follow Up Reason: Appointment Review Appointment Review  Navigator Location [facility]  Navigator Encounter Type Follow-up Appt/I followed up on Julie Nicholson schedule for concurrent chemo rad.  She has an appt to see rad onc this week but chemo has not been scheduled. I reached out to scheduling team for an update.  Other:  Treatment Phase - Pre-Tx/Tx Discussion  Barriers/Navigation Needs Coordination of Care -  Interventions Coordination of Care Other  Acuity Level 2-Minimal Needs (1-2 Barriers Identified) Level 2-Minimal Needs (1-2 Barriers Identified)  Coordination of Care Appts -  Time Spent with Patient 30 15

## 2020-12-22 NOTE — Progress Notes (Signed)
Oncology Nurse Navigator Documentation  Oncology Nurse Navigator Flowsheets 12/22/2020 12/22/2020 12/22/2020 12/18/2020  Abnormal Finding Date - - - 11/27/2020  Confirmed Diagnosis Date - - - 12/15/2020  Diagnosis Status Pending Molecular Studies/Guardant 360 pending  Confirmed Diagnosis Complete - Pending Molecular Studies  Planned Course of Treatment - - - Chemo/Radiation Concurrent  Phase of Treatment - - Radiation Radiation  Chemotherapy Pending- Reason: - - (No Data) -  Navigator Follow Up Date: - - 12/23/2020 12/19/2020  Navigator Follow Up Reason: - - Appointment Review Appointment Review  Navigator Location [facility]  Navigator Encounter Type Other: Diagnostic Results Follow-up Appt Other:  Treatment Phase - - - Pre-Tx/Tx Discussion  Barriers/Navigation Needs - Coordination of Care Coordination of Care -  Interventions Other Coordination of Care Coordination of Care Other  Acuity Level 2-Minimal Needs (1-2 Barriers Identified) Level 2-Minimal Needs (1-2 Barriers Identified) Level 2-Minimal Needs (1-2 Barriers Identified) Level 2-Minimal Needs (1-2 Barriers Identified)  Coordination of Care Pathology Pathology Appts -  Time Spent with Patient 15 15 30  15

## 2020-12-22 NOTE — Progress Notes (Signed)
Oncology Nurse Navigator Documentation  Oncology Nurse Navigator Flowsheets 12/22/2020 12/22/2020 12/18/2020  Abnormal Finding Date - - 11/27/2020  Confirmed Diagnosis Date - - 12/15/2020  Diagnosis Status Confirmed Diagnosis Complete - Pending Molecular Studies  Planned Course of Treatment - - Chemo/Radiation Concurrent  Phase of Treatment - Radiation Radiation  Chemotherapy Pending- Reason: - (No Data) -  Navigator Follow Up Date: - 12/23/2020 12/19/2020  Navigator Follow Up Reason: - Appointment Review Appointment Review  Navigator Location [facility]  Navigator Encounter Type Diagnostic Results/I contacted Dr. Julien Nordmann to update him that recent bx has insufficient cellularity for additional testing and to see if patient needs Guardant 360 testing.  Wait for response.  Follow-up Appt Other:  Treatment Phase - - Pre-Tx/Tx Discussion  Barriers/Navigation Needs Coordination of Care Coordination of Care -  Interventions Coordination of Care Coordination of Care Other  Acuity Level 2-Minimal Needs (1-2 Barriers Identified) Level 2-Minimal Needs (1-2 Barriers Identified) Level 2-Minimal Needs (1-2 Barriers Identified)  Coordination of Care Pathology Appts -  Time Spent with Patient 15 30 15

## 2020-12-23 ENCOUNTER — Encounter: Payer: Self-pay | Admitting: *Deleted

## 2020-12-23 ENCOUNTER — Telehealth: Payer: Self-pay | Admitting: Physician Assistant

## 2020-12-23 NOTE — Progress Notes (Signed)
Oncology Nurse Navigator Documentation  Oncology Nurse Navigator Flowsheets 12/23/2020 12/22/2020 12/22/2020 12/22/2020 12/18/2020  Abnormal Finding Date - - - - 11/27/2020  Confirmed Diagnosis Date - - - - 12/15/2020  Diagnosis Status - Pending Molecular Studies Confirmed Diagnosis Complete - Pending Molecular Studies  Planned Course of Treatment Chemo/Radiation Concurrent - - - Chemo/Radiation Concurrent  Phase of Treatment Radiation - - Radiation Radiation  Chemotherapy Pending- Reason: - - - (No Data) -  Navigator Follow Up Date: 12/29/2020 - - 12/23/2020 12/19/2020  Navigator Follow Up Reason: Other: - - Appointment Review Appointment Review  Navigator Location [facility]  Navigator Encounter Type Other: Other: Diagnostic Results Follow-up Appt Other:  Treatment Initiated Date 12/29/2020 - - - -  Patient Visit Type Other - - - -  Treatment Phase Other - - - Pre-Tx/Tx Discussion  Barriers/Navigation Needs Coordination of Care/patient is scheduled for 1st on 11/28.   - Coordination of Care Coordination of Care -  Interventions Coordination of Care Other Coordination of Care Coordination of Care Other  Acuity Level 2-Minimal Needs (1-2 Barriers Identified) Level 2-Minimal Needs (1-2 Barriers Identified) Level 2-Minimal Needs (1-2 Barriers Identified) Level 2-Minimal Needs (1-2 Barriers Identified) Level 2-Minimal Needs (1-2 Barriers Identified)  Coordination of Care Other Pathology Pathology Appts -  Time Spent with Patient 30 15 15 30  15

## 2020-12-23 NOTE — Telephone Encounter (Signed)
Sch per 11/18 los, left msg

## 2020-12-29 NOTE — Progress Notes (Signed)
Pharmacist Chemotherapy Monitoring - Initial Assessment    Anticipated start date: 01/05/21   The following has been reviewed per standard work regarding the patient's treatment regimen: The patient's diagnosis, treatment plan and drug doses, and organ/hematologic function Lab orders and baseline tests specific to treatment regimen  The treatment plan start date, drug sequencing, and pre-medications Prior authorization status  Patient's documented medication list, including drug-drug interaction screen and prescriptions for anti-emetics and supportive care specific to the treatment regimen The drug concentrations, fluid compatibility, administration routes, and timing of the medications to be used The patient's access for treatment and lifetime cumulative dose history, if applicable  The patient's medication allergies and previous infusion related reactions, if applicable   Changes made to treatment plan:  N/A  Follow up needed:  Pending authorization for treatment   Benn Moulder, PharmD Pharmacy Resident  12/29/2020 10:01 AM

## 2020-12-30 NOTE — Progress Notes (Signed)
Radiation Oncology         [phone] ________________________________  Initial Outpatient Consultation  Name: Julie Nicholson MRN: [id number]  Date: 12/31/2020  DOB: 1965-09-07  NW:GNFAOZHY, Joelene Millin, NP  Curt Bears, MD   REFERRING PHYSICIAN: Curt Bears, MD  DIAGNOSIS: The encounter diagnosis was Primary adenocarcinoma of upper lobe of right lung [facility]).  Non-small cell carcinoma of the RUL, adenocarcinoma stage III-B (T1b, N3, M0)  HISTORY OF PRESENT ILLNESS::Julie Nicholson is a 55 y.o. female who is accompanied by her daughter. she is seen as a courtesy of Dr. Julien Nordmann for an opinion concerning radiation therapy as part of management for her recently diagnosed right lung cancer. The patient presented to the ED on 11/27/20 with acute onset right-sided chest discomfort and stabbing right upper back pain.  Patient reported associated cough, mild sore throat, headache, mild SOB, and right neck pain. She had been prescribed prednisone over the past several days prior to onset of chest pain. CXR performed during ED evaluation revealed a nodular opacity projecting over the right mid-lung, measuring 2.7 cm.  CTA further revealed an 11 mm spiculated nodule in the right upper lobe concerning for malignancy. Associated bulky right hilar and mediastinal adenopathy was appreciated as well.  Subsequently, the patient was referred to Dr. Julien Nordmann on 12/01/20 for further evaluation. Dr. Julien Nordmann recommended the patient to complete further staging workup via PET and MRI of the brain to rule out metastatic disease.   PET on 12/12/20 demonstrated the hypermetabolic right upper lobe spiculated nodule as concerning for primary lung malignancy. Also seen were hypermetabolic right hilar, mediastinal and left supraclavicular adenopathy, concerning for metastatic disease.  Accordingly, the patient was referred to Dr. Lamonte Sakai for bronchoscopy and biopsies. RUL biopsies performed on 12/15/20 revealed  malignant cells consistent with non-small cell carcinoma. Fine needle aspiration of the 4R, 10R, and 7 lymph nodes revealed malignant cells consistent with non-small cell carcinoma (adenocarcinoma)  Post-bronchoscopy CXR on 12/15/20 revealed no pneumothorax and stable appearance of the known right upper lobe pulmonary nodule.   MRI of the brain on 12/17/20 revealed no evidence of metastatic disease to the brain.    PREVIOUS RADIATION THERAPY: No  PAST MEDICAL HISTORY:  Past Medical History:  Diagnosis Date   Abdominal discomfort    Chronic headaches    due to allergies, sinus   COPD (chronic obstructive pulmonary disease) ([place])    per 2012 chest xray   pt states she doesn not have this now (04/10/2013)   Deviated nasal septum    Eustachian tube dysfunction    GERD (gastroesophageal reflux disease)    occasional uses Tums / Rolaids   Hearing loss    right ear   High cholesterol    Migraine    "only once in a blue moon since RX'd allergy shots" (04/10/2013)   Pancreatitis 02/08/2013   Pneumonia    Rhinitis, allergic     PAST SURGICAL HISTORY: Past Surgical History:  Procedure Laterality Date   ABDOMINAL HYSTERECTOMY  1995   tx endometriosis, both ovaries removed   APPENDECTOMY  late 1990's   BRONCHIAL BRUSHINGS  12/15/2020   Procedure: BRONCHIAL BRUSHINGS;  Surgeon: Collene Gobble, MD;  Location: [place];  Service: Cardiopulmonary;;   BRONCHIAL NEEDLE ASPIRATION BIOPSY  12/15/2020   Procedure: BRONCHIAL NEEDLE ASPIRATION BIOPSIES;  Surgeon: Collene Gobble, MD;  Location: [place];  Service: Cardiopulmonary;;   CHOLECYSTECTOMY  04/10/2013   CHOLECYSTECTOMY N/A 04/10/2013   Procedure: LAPAROSCOPIC CHOLECYSTECTOMY WITH INTRAOPERATIVE CHOLANGIOGRAM;  Surgeon:  Odis Hollingshead, MD;  Location: [place];  Service: General;  Laterality: N/A;   ELECTROMAGNETIC NAVIGATION BROCHOSCOPY  12/15/2020   Procedure: ELECTROMAGNETIC NAVIGATION BRONCHOSCOPY;  Surgeon: Collene Gobble,  MD;  [facility];  Service: Cardiopulmonary;;   EUS N/A 02/16/2013   Procedure: UPPER ENDOSCOPIC ULTRASOUND (EUS) LINEAR;  Surgeon: Milus Banister, MD;  Location: [facility] ENDOSCOPY;  Service: Endoscopy;  Laterality: N/A;  radial linear   KNEE ARTHROSCOPY Right 1980's   "cartilage OR"   LAPAROSCOPIC ENDOMETRIOSIS FULGURATION  1980's   MYRINGOTOMY WITH TUBE PLACEMENT Right 07/13/2018   Procedure: MYRINGOTOMY WITH TUBE PLACEMENT;  Surgeon: Margaretha Sheffield, MD;  Location: [place];  Service: ENT;  Laterality: Right;   NASOPHARYNGOSCOPY EUSTATION TUBE BALLOON DILATION Right 07/13/2018   Procedure: NASOPHARYNGOSCOPY EUSTATION TUBE BALLOON DILATION;  Surgeon: Margaretha Sheffield, MD;  Location: [place];  Service: ENT;  Laterality: Right;   TONSILLECTOMY AND ADENOIDECTOMY  ~ 1980   adenoidectomy   TUBAL LIGATION  ~ [place] Right 07/13/2018   Procedure: OUTFRACTURE TURBINATE;  Surgeon: Margaretha Sheffield, MD;  Location: [place];  Service: ENT;  Laterality: Right;   VIDEO BRONCHOSCOPY WITH ENDOBRONCHIAL ULTRASOUND N/A 12/15/2020   Procedure: ROBOTIC VIDEO BRONCHOSCOPY WITH ENDOBRONCHIAL ULTRASOUND;  Surgeon: Collene Gobble, MD;  Location: [place];  Service: Cardiopulmonary;  Laterality: N/A;   WRIST SURGERY Left    w/plate    FAMILY HISTORY:  Family History  Problem Relation Age of Onset   Diabetes Father    Diabetes Mother    COPD Mother    COPD Maternal Grandfather    COPD Maternal Aunt    COPD Maternal Uncle     SOCIAL HISTORY:  Social History   Tobacco Use   Smoking status: Every Day    Packs/day: 0.25    Years: 33.00    Pack years: 8.25    Types: Cigarettes   Smokeless tobacco: Never   Tobacco comments:    4 cigarettes smoked daily ARJ 12/09/20  Vaping Use   Vaping Use: Never used  Substance Use Topics   Alcohol use: Not Currently    Comment: occasional   Drug use: No    ALLERGIES: No Known Allergies  MEDICATIONS:   Current Outpatient Medications  Medication Sig Dispense Refill   acetaminophen (TYLENOL) 500 MG tablet Take 500 mg by mouth as needed.     aspirin EC 81 MG tablet Take 81 mg by mouth at bedtime.     atorvastatin (LIPITOR) 40 MG tablet Take 40 mg by mouth at bedtime.     Biotin 5000 MCG TABS Take 5,000 mcg by mouth at bedtime.     buPROPion (WELLBUTRIN SR) 150 MG 12 hr tablet Take 1 tablet (150 mg total) by mouth daily for 3 days, THEN 1 tablet (150 mg total) in the morning and at bedtime for 27 days. 57 tablet 0   Ca Carbonate-Mag Hydroxide (ROLAIDS PO) Take 1 tablet by mouth daily.     fluticasone (FLONASE) 50 MCG/ACT nasal spray Place 2 sprays into both nostrils daily as needed for allergies.     ibuprofen (ADVIL) 200 MG tablet Take 200 mg by mouth as needed.     Multiple Vitamins-Minerals (HAIR SKIN AND NAILS FORMULA PO) Take 1 tablet by mouth daily.     oxycodone (OXY-IR) 5 MG capsule Take 5 mg by mouth 3 (three) times daily as needed for pain.     UBRELVY 100 MG TABS Take 100 mg by mouth  daily as needed (migraine).     albuterol (VENTOLIN HFA) 108 (90 Base) MCG/ACT inhaler Inhale 1-2 puffs into the lungs every 6 (six) hours as needed for wheezing or shortness of breath. (Patient not taking: Reported on 12/31/2020) 6.7 g 0   prochlorperazine (COMPAZINE) 10 MG tablet Take 1 tablet (10 mg total) by mouth every 6 (six) hours as needed. (Patient not taking: Reported on 12/31/2020) 30 tablet 2   No current facility-administered medications for this encounter.    REVIEW OF SYSTEMS:  A 10+ POINT REVIEW OF SYSTEMS WAS OBTAINED including neurology, dermatology, psychiatry, cardiac, respiratory, lymph, extremities, GI, GU, musculoskeletal, constitutional, reproductive, HEENT.  Denies any pain within the chest significant cough or hemoptysis.  She denies any new bony pain headaches dizziness or blurred vision.  She reports a good appetite but has lost approximately 25 pounds over the past 6 months.    PHYSICAL EXAM:  height is 5\' 1"  (1.549 m) and weight is 100 lb 9.6 oz (45.6 kg). Her temperature is 97.5 F (36.4 C) (abnormal). Her blood pressure is 118/68 and her pulse is 69. Her respiration is 18 and oxygen saturation is 100%.   General: Alert and oriented, in no acute distress HEENT: Head is normocephalic. Extraocular movements are intact. Oropharynx is clear.  Teeth in good repair. Neck: Neck is supple, no palpable cervical or supraclavicular lymphadenopathy. Heart: Regular in rate and rhythm with no murmurs, rubs, or gallops. Chest: Clear to auscultation bilaterally, with no rhonchi, wheezes, or rales. Abdomen: Soft, nontender, nondistended, with no rigidity or guarding. Extremities: No cyanosis or edema. Lymphatics: see Neck Exam Skin: No concerning lesions. Musculoskeletal: symmetric strength and muscle tone throughout. Neurologic: Cranial nerves II through XII are grossly intact. No obvious focalities. Speech is fluent. Coordination is intact. Psychiatric: Judgment and insight are intact. Affect is appropriate.   ECOG = 1  0 - Asymptomatic (Fully active, able to carry on all predisease activities without restriction)  1 - Symptomatic but completely ambulatory (Restricted in physically strenuous activity but ambulatory and able to carry out work of a light or sedentary nature. For example, light housework, office work)  2 - Symptomatic, <50% in bed during the day (Ambulatory and capable of all self care but unable to carry out any work activities. Up and about more than 50% of waking hours)  3 - Symptomatic, >50% in bed, but not bedbound (Capable of only limited self-care, confined to bed or chair 50% or more of waking hours)  4 - Bedbound (Completely disabled. Cannot carry on any self-care. Totally confined to bed or chair)  5 - Death   Eustace Pen MM, Creech RH, Tormey DC, et al. [phone]). "Toxicity and response criteria of the [facility]". [place]  Oncol. 5 (6): 649-55  LABORATORY DATA:  Lab Results  Component Value Date   WBC 7.6 12/18/2020   HGB 13.2 12/18/2020   HCT 39.7 12/18/2020   MCV 95.9 12/18/2020   PLT 332 12/18/2020   NEUTROABS 5.1 12/18/2020   Lab Results  Component Value Date   NA 141 12/18/2020   K 4.1 12/18/2020   CL 103 12/18/2020   CO2 28 12/18/2020   GLUCOSE 86 12/18/2020   CREATININE 0.80 12/18/2020   CALCIUM 9.7 12/18/2020      RADIOGRAPHY: MR Brain W Wo Contrast  Result Date: 12/18/2020 CLINICAL DATA:  Lung mass.  Cancer of unknown primary, surveillance EXAM: MRI HEAD WITHOUT AND WITH CONTRAST TECHNIQUE: Multiplanar, multiecho pulse sequences of the brain  and surrounding structures were obtained without and with intravenous contrast. CONTRAST:  62mL GADAVIST GADOBUTROL 1 MMOL/ML IV SOLN COMPARISON:  CT head 12/20/2003 FINDINGS: Brain: No acute infarction, hemorrhage, hydrocephalus, extra-axial collection or mass lesion. Negative for metastatic disease.  No enhancing lesions in the brain. Mild white matter changes with scattered small subcortical white matter hyperintensities bilaterally which appear chronic. Vascular: Normal arterial flow voids. Skull and upper cervical spine: Cortical exostosis left lateral occipital bone unchanged from the prior CT 2005. This appears benign. No osseous metastatic disease present. Sinuses/Orbits: Mild mucosal edema right maxillary sinus. Remaining sinuses clear. Negative orbit Other: None IMPRESSION: Negative for metastatic disease to the brain Mild white matter changes likely chronic microvascular ischemia. Electronically Signed   By: Franchot Gallo M.D.   On: 12/18/2020 09:15   NM PET Image Initial (PI) Skull Base To Thigh  Result Date: 12/14/2020 CLINICAL DATA:  Initial treatment strategy for lung nodule. EXAM: NUCLEAR MEDICINE PET SKULL BASE TO THIGH TECHNIQUE: 5.4 mCi F-18 FDG was injected intravenously. Full-ring PET imaging was performed from the skull base to thigh  after the radiotracer. CT data was obtained and used for attenuation correction and anatomic localization. Fasting blood glucose: 102 mg/dl COMPARISON:  CT chest dated November 27, 2020 FINDINGS: Mediastinal blood pool activity: SUV max 1.9 Liver activity: SUV max 2.8 NECK: No hypermetabolic lymph nodes in the neck. Incidental CT findings: none CHEST: Right upper lobe solid pulmonary nodule demonstrates hypermetabolic activity with an SUV max of 12.5. Enlarged and hypermetabolic right hilar, subcarinal and prevascular lymph nodes. Reference right lower paratracheal lymph node measures 2.0 cm in short axis on series 4, image 53 with SUV max of 16.4. Reference right hilar lymph node measures 2.4 cm in short axis on image 61 with an SUV max of 17.8. Hypermetabolic left supraclavicular lymph node measuring 4 mm in short axis with SUV max of 3.8. Incidental CT findings: Mild coronary artery calcifications of the LAD. Upper lobe predominant centrilobular emphysema. ABDOMEN/PELVIS: No abnormal hypermetabolic activity within the liver, pancreas, adrenal glands, or spleen. No hypermetabolic lymph nodes in the abdomen or pelvis. Incidental CT findings: Cholecystectomy clips. Atherosclerotic disease of the abdominal aorta. SKELETON: No focal hypermetabolic activity to suggest skeletal metastasis. Incidental CT findings: none IMPRESSION: 1. Hypermetabolic right upper lobe spiculated nodule, concerning for primary lung malignancy. 2. Hypermetabolic right hilar, mediastinal and left supraclavicular adenopathy, concerning for metastatic disease. 3. Aortic Atherosclerosis (ICD10-I70.0) and Emphysema (ICD10-J43.9). Electronically Signed   By: Yetta Glassman M.D.   On: 12/14/2020 13:29   DG Chest Port 1 View  Result Date: 12/15/2020 CLINICAL DATA:  Status post bronchoscopic biopsy EXAM: PORTABLE CHEST 1 VIEW COMPARISON:  11/27/2020 chest radiograph and chest CT angiogram. FINDINGS: Stable cardiomediastinal silhouette with  normal heart size and thickening of the right paratracheal stripe. No pneumothorax. No pleural effusion. Right upper lobe nodular opacity appears unchanged. No superimposed acute consolidative airspace disease. No pulmonary edema. Cholecystectomy clips are seen in the right upper quadrant of the abdomen. IMPRESSION: 1. No pneumothorax. 2. Stable known right upper lobe pulmonary nodule. 3. Stable thickening of the right paratracheal stripe compatible with known right paratracheal lymphadenopathy. Electronically Signed   By: Ilona Sorrel M.D.   On: 12/15/2020 09:32   DG C-ARM BRONCHOSCOPY  Result Date: 12/15/2020 C-ARM BRONCHOSCOPY: Fluoroscopy was utilized by the requesting physician.  No radiographic interpretation.      IMPRESSION: Non-small cell carcinoma of the RUL, Clinical Stage III-B  The patient would be a good candidate for  definitive course of radiation therapy along with radiosensitizing chemotherapy.  Today, I talked to the patient and her daughter about the findings and work-up thus far.  We discussed the natural history of non-small cell lung cancer and general treatment, highlighting the role of radiotherapy in the management.  We discussed the available radiation techniques, and focused on the details of logistics and delivery.  We reviewed the anticipated acute and late sequelae associated with radiation in this setting.  The patient was encouraged to ask questions that I answered to the best of my ability.  A patient consent form was discussed and signed.  We retained a copy for our records.  The patient would like to proceed with radiation and will be scheduled for CT simulation.  PLAN: The patient will return tomorrow for CT simulation with treatments to begin next week concomitant with her radiosensitizing chemotherapy.  Anticipate 6 weeks of radiation therapy   60 minutes of total time was spent for this patient encounter, including preparation, face-to-face counseling with the  patient and coordination of care, physical exam, and documentation of the encounter.   ------------------------------------------------  Blair Promise, PhD, MD  This document serves as a record of services personally performed by Gery Pray, MD. It was created on his behalf by Roney Mans, a trained medical scribe. The creation of this record is based on the scribe's personal observations and the provider's statements to them. This document has been checked and approved by the attending provider.

## 2020-12-31 ENCOUNTER — Other Ambulatory Visit: Payer: Self-pay

## 2020-12-31 ENCOUNTER — Ambulatory Visit
Admission: RE | Admit: 2020-12-31 | Discharge: 2020-12-31 | Disposition: A | Discharge status: Home / Self Care | Payer: Commercial Managed Care - PPO | Source: Ambulatory Visit | Attending: Radiation Oncology | Admitting: Radiation Oncology

## 2020-12-31 ENCOUNTER — Inpatient Hospital Stay: Payer: Commercial Managed Care - PPO

## 2020-12-31 ENCOUNTER — Encounter: Payer: Self-pay | Admitting: Radiation Oncology

## 2020-12-31 ENCOUNTER — Telehealth: Payer: Self-pay | Admitting: Medical Oncology

## 2020-12-31 VITALS — BP 118/68 | HR 69 | Temp 97.5°F | Resp 18 | Ht 61.0 in | Wt 100.6 lb

## 2020-12-31 DIAGNOSIS — R059 Cough, unspecified: Secondary | ICD-10-CM | POA: Insufficient documentation

## 2020-12-31 DIAGNOSIS — F17210 Nicotine dependence, cigarettes, uncomplicated: Secondary | ICD-10-CM | POA: Diagnosis not present

## 2020-12-31 DIAGNOSIS — I700 Atherosclerosis of aorta: Secondary | ICD-10-CM | POA: Insufficient documentation

## 2020-12-31 DIAGNOSIS — E7800 Pure hypercholesterolemia, unspecified: Secondary | ICD-10-CM | POA: Insufficient documentation

## 2020-12-31 DIAGNOSIS — J432 Centrilobular emphysema: Secondary | ICD-10-CM | POA: Diagnosis not present

## 2020-12-31 DIAGNOSIS — Z791 Long term (current) use of non-steroidal anti-inflammatories (NSAID): Secondary | ICD-10-CM | POA: Diagnosis not present

## 2020-12-31 DIAGNOSIS — K219 Gastro-esophageal reflux disease without esophagitis: Secondary | ICD-10-CM | POA: Insufficient documentation

## 2020-12-31 DIAGNOSIS — C3411 Malignant neoplasm of upper lobe, right bronchus or lung: Secondary | ICD-10-CM | POA: Diagnosis not present

## 2020-12-31 NOTE — Progress Notes (Signed)
See MD note for nursing evaluation. °

## 2020-12-31 NOTE — Telephone Encounter (Signed)
Disability form received and placed in [facility] department folder .

## 2021-01-01 ENCOUNTER — Ambulatory Visit
Admission: RE | Admit: 2021-01-01 | Discharge: 2021-01-01 | Disposition: A | Discharge status: Home / Self Care | Payer: Commercial Managed Care - PPO | Source: Ambulatory Visit | Attending: Radiation Oncology | Admitting: Radiation Oncology

## 2021-01-01 DIAGNOSIS — C3411 Malignant neoplasm of upper lobe, right bronchus or lung: Secondary | ICD-10-CM | POA: Insufficient documentation

## 2021-01-01 DIAGNOSIS — Z510 Encounter for antineoplastic radiation therapy: Secondary | ICD-10-CM | POA: Insufficient documentation

## 2021-01-01 DIAGNOSIS — Z5111 Encounter for antineoplastic chemotherapy: Secondary | ICD-10-CM | POA: Diagnosis present

## 2021-01-02 ENCOUNTER — Other Ambulatory Visit: Payer: Self-pay

## 2021-01-02 ENCOUNTER — Inpatient Hospital Stay: Payer: Commercial Managed Care - PPO

## 2021-01-02 ENCOUNTER — Encounter: Payer: Self-pay | Admitting: *Deleted

## 2021-01-02 DIAGNOSIS — Z5111 Encounter for antineoplastic chemotherapy: Secondary | ICD-10-CM | POA: Insufficient documentation

## 2021-01-02 DIAGNOSIS — C3411 Malignant neoplasm of upper lobe, right bronchus or lung: Secondary | ICD-10-CM | POA: Insufficient documentation

## 2021-01-02 MED FILL — Dexamethasone Sodium Phosphate Inj 100 MG/10ML: INTRAMUSCULAR | Qty: 1 | Status: AC

## 2021-01-02 NOTE — Progress Notes (Signed)
Oncology Nurse Navigator Documentation  Oncology Nurse Navigator Flowsheets 01/02/2021 12/23/2020 12/22/2020 12/22/2020 12/22/2020 12/18/2020  Abnormal Finding Date - - - - - 11/27/2020  Confirmed Diagnosis Date - - - - - 12/15/2020  Diagnosis Status - - Pending Molecular Studies Confirmed Diagnosis Complete - Pending Molecular Studies  Planned Course of Treatment Chemo/Radiation Concurrent Chemo/Radiation Concurrent - - - Chemo/Radiation Concurrent  Phase of Treatment Radiation Radiation - - Radiation Radiation  Chemotherapy Pending- Reason: - - - - (No Data) -  Chemotherapy Actual Start Date: 01/05/2021 - - - - -  Radiation Actual Start Date: 12/29/2020 - - - - -  Navigator Follow Up Date: 01/05/2021 12/29/2020 - - 12/23/2020 12/19/2020  Navigator Follow Up Reason: Chemotherapy Other: - - Appointment Review Appointment Review  Navigator Location [facility]  Navigator Encounter Type Other: Other: Other: Diagnostic Results Follow-up Appt Other:  Treatment Initiated Date - 12/29/2020 - - - -  Patient Visit Type Other Other - - - -  Treatment Phase Other Other - - - Pre-Tx/Tx Discussion  Barriers/Navigation Needs Coordination of Care Coordination of Care - Coordination of Care Coordination of Care -  Interventions Coordination of Care/patient is set up for her tx plan at this time.  1st systemic therapy to start next week.  Coordination of Care Other Coordination of Care Coordination of Care Other  Acuity Level 2-Minimal Needs (1-2 Barriers Identified) Level 2-Minimal Needs (1-2 Barriers Identified) Level 2-Minimal Needs (1-2 Barriers Identified) Level 2-Minimal Needs (1-2 Barriers Identified) Level 2-Minimal Needs (1-2 Barriers Identified) Level 2-Minimal Needs (1-2 Barriers Identified)  Coordination of Care - Other Pathology Pathology Appts -  Time Spent with Patient 30 30 15 15 30  15

## 2021-01-02 NOTE — Telephone Encounter (Signed)
Pt called this morning wanting the status of her paperwork and I have advised her again that it takes 10 business days for completion. Pt became very upset and verbally abusive because she does not agree with the timeframe. I advised the pt multiple times that we process the paperwork as quickly has possible for it can take up to 10 business days.   Pt then stated she will come to the [facility] in person to demand her paperwork and call was disconnected.

## 2021-01-05 ENCOUNTER — Inpatient Hospital Stay: Payer: Commercial Managed Care - PPO

## 2021-01-05 ENCOUNTER — Encounter: Payer: Self-pay | Admitting: *Deleted

## 2021-01-05 ENCOUNTER — Encounter: Payer: Commercial Managed Care - PPO | Admitting: Nutrition

## 2021-01-05 ENCOUNTER — Other Ambulatory Visit: Payer: Self-pay

## 2021-01-05 ENCOUNTER — Other Ambulatory Visit: Payer: Commercial Managed Care - PPO

## 2021-01-05 VITALS — BP 115/63 | HR 70 | Temp 98.3°F | Resp 16

## 2021-01-05 DIAGNOSIS — C3411 Malignant neoplasm of upper lobe, right bronchus or lung: Secondary | ICD-10-CM

## 2021-01-05 DIAGNOSIS — Z510 Encounter for antineoplastic radiation therapy: Secondary | ICD-10-CM | POA: Diagnosis not present

## 2021-01-05 LAB — CMP (CANCER CENTER ONLY)
ALT: 21 U/L (ref 0–44)
AST: 18 U/L (ref 15–41)
Albumin: 3.7 g/dL (ref 3.5–5.0)
Alkaline Phosphatase: 115 U/L (ref 38–126)
Anion gap: 11 (ref 5–15)
BUN: 15 mg/dL (ref 6–20)
CO2: 24 mmol/L (ref 22–32)
Calcium: 9.3 mg/dL (ref 8.9–10.3)
Chloride: 107 mmol/L (ref 98–111)
Creatinine: 0.8 mg/dL (ref 0.44–1.00)
GFR, Estimated: >60 mL/min (ref >60)
Glucose, Bld: 120 mg/dL — ABNORMAL HIGH (ref 70–99)
Potassium: 3.8 mmol/L (ref 3.5–5.1)
Sodium: 142 mmol/L (ref 135–145)
Total Bilirubin: 0.9 mg/dL (ref 0.3–1.2)
Total Protein: 7.1 g/dL (ref 6.5–8.1)

## 2021-01-05 LAB — CBC WITH DIFFERENTIAL (CANCER CENTER ONLY)
Abs Immature Granulocytes: 0.02 10*3/uL (ref 0.00–0.07)
Basophils Absolute: 0 10*3/uL (ref 0.0–0.1)
Basophils Relative: 0 %
Eosinophils Absolute: 0.1 10*3/uL (ref 0.0–0.5)
Eosinophils Relative: 1 %
HCT: 39.2 % (ref 36.0–46.0)
Hemoglobin: 13.3 g/dL (ref 12.0–15.0)
Immature Granulocytes: 0 %
Lymphocytes Relative: 23 %
Lymphs Abs: 1.7 10*3/uL (ref 0.7–4.0)
MCH: 32.1 pg (ref 26.0–34.0)
MCHC: 33.9 g/dL (ref 30.0–36.0)
MCV: 94.7 fL (ref 80.0–100.0)
Monocytes Absolute: 0.5 10*3/uL (ref 0.1–1.0)
Monocytes Relative: 7 %
Neutro Abs: 5 10*3/uL (ref 1.7–7.7)
Neutrophils Relative %: 69 %
Platelet Count: 230 10*3/uL (ref 150–400)
RBC: 4.14 MIL/uL (ref 3.87–5.11)
RDW: 13 % (ref 11.5–15.5)
WBC Count: 7.2 10*3/uL (ref 4.0–10.5)
nRBC: 0 % (ref 0.0–0.2)

## 2021-01-05 MED ORDER — PALONOSETRON HCL INJECTION 0.25 MG/5ML
0.2500 mg | Freq: Once | INTRAVENOUS | Status: AC
  Administered 2021-01-05: 0.25 mg via INTRAVENOUS
  Filled 2021-01-05: qty 5

## 2021-01-05 MED ORDER — SODIUM CHLORIDE 0.9 % IV SOLN: Freq: Once | INTRAVENOUS | Status: AC

## 2021-01-05 MED ORDER — SODIUM CHLORIDE 0.9 % IV SOLN
162.2000 mg | Freq: Once | INTRAVENOUS | Status: AC
  Administered 2021-01-05: 160 mg via INTRAVENOUS
  Filled 2021-01-05: qty 16

## 2021-01-05 MED ORDER — FAMOTIDINE 20 MG IN NS 100 ML IVPB
20.0000 mg | Freq: Once | INTRAVENOUS | Status: AC
  Administered 2021-01-05: 20 mg via INTRAVENOUS
  Filled 2021-01-05: qty 100

## 2021-01-05 MED ORDER — SODIUM CHLORIDE 0.9 % IV SOLN
10.0000 mg | Freq: Once | INTRAVENOUS | Status: AC
  Administered 2021-01-05: 10 mg via INTRAVENOUS
  Filled 2021-01-05: qty 10

## 2021-01-05 MED ORDER — SODIUM CHLORIDE 0.9 % IV SOLN
45.0000 mg/m2 | Freq: Once | INTRAVENOUS | Status: AC
  Administered 2021-01-05: 66 mg via INTRAVENOUS
  Filled 2021-01-05: qty 11

## 2021-01-05 MED ORDER — DIPHENHYDRAMINE HCL 50 MG/ML IJ SOLN
50.0000 mg | Freq: Once | INTRAMUSCULAR | Status: AC
  Administered 2021-01-05: 50 mg via INTRAVENOUS
  Filled 2021-01-05: qty 1

## 2021-01-05 NOTE — Patient Instructions (Signed)
[facility]  Discharge Instructions: Thank you for choosing [facility] to provide your oncology and hematology care.   If you have a lab appointment with the [facility], please go directly to the [facility] and check in at the registration area.   Wear comfortable clothing and clothing appropriate for easy access to any Portacath or PICC line.   We strive to give you quality time with your provider. You may need to reschedule your appointment if you arrive late (15 or more minutes).  Arriving late affects you and other patients whose appointments are after yours.  Also, if you miss three or more appointments without notifying the office, you may be dismissed from the clinic at the provider's discretion.      For prescription refill requests, have your pharmacy contact our office and allow 72 hours for refills to be completed.    Today you received the following chemotherapy and/or immunotherapy agents: Paclitaxel, carboplatin.      To help prevent nausea and vomiting after your treatment, we encourage you to take your nausea medication as directed.  BELOW ARE SYMPTOMS THAT SHOULD BE REPORTED IMMEDIATELY: *FEVER GREATER THAN 100.4 F (38 C) OR HIGHER *CHILLS OR SWEATING *NAUSEA AND VOMITING THAT IS NOT CONTROLLED WITH YOUR NAUSEA MEDICATION *UNUSUAL SHORTNESS OF BREATH *UNUSUAL BRUISING OR BLEEDING *URINARY PROBLEMS (pain or burning when urinating, or frequent urination) *BOWEL PROBLEMS (unusual diarrhea, constipation, pain near the anus) TENDERNESS IN MOUTH AND THROAT WITH OR WITHOUT PRESENCE OF ULCERS (sore throat, sores in mouth, or a toothache) UNUSUAL RASH, SWELLING OR PAIN  UNUSUAL VAGINAL DISCHARGE OR ITCHING   Items with * indicate a potential emergency and should be followed up as soon as possible or go to the Emergency Department if any problems should occur.  Please show the CHEMOTHERAPY ALERT CARD or IMMUNOTHERAPY ALERT CARD  at check-in to the Emergency Department and triage nurse.  Should you have questions after your visit or need to cancel or reschedule your appointment, please contact [facility]  Dept: [phone]  and follow the prompts.  Office hours are 8:00 a.m. to 4:30 p.m. Monday - Friday. Please note that voicemails left after 4:00 p.m. may not be returned until the following business day.  We are closed weekends and major holidays. You have access to a nurse at all times for urgent questions. Please call the main number to the clinic Dept: [phone] and follow the prompts.   For any non-urgent questions, you may also contact your provider using MyChart. We now offer e-Visits for anyone 74 and older to request care online for non-urgent symptoms. For details visit [email or web address].   Also download the MyChart app! Go to the app store, search "MyChart", open the app, select [facility], and log in with your MyChart username and password.  Due to Covid, a mask is required upon entering the hospital/clinic. If you do not have a mask, one will be given to you upon arrival. For doctor visits, patients may have 1 support person aged 46 or older with them. For treatment visits, patients cannot have anyone with them due to current Covid guidelines and our immunocompromised population.   Paclitaxel injection What is this medication? PACLITAXEL (PAK li TAX el) is a chemotherapy drug. It targets fast dividing cells, like cancer cells, and causes these cells to die. This medicine is used to treat ovarian cancer, breast cancer, lung cancer, Kaposi's sarcoma, and other cancers. This medicine  may be used for other purposes; ask your health care provider or pharmacist if you have questions. COMMON BRAND NAME(S): Onxol, Taxol What should I tell my care team before I take this medication? They need to know if you have any of these conditions: history of irregular heartbeat liver  disease low blood counts, like low white cell, platelet, or red cell counts lung or breathing disease, like asthma tingling of the fingers or toes, or other nerve disorder an unusual or allergic reaction to paclitaxel, alcohol, polyoxyethylated castor oil, other chemotherapy, other medicines, foods, dyes, or preservatives pregnant or trying to get pregnant breast-feeding How should I use this medication? This drug is given as an infusion into a vein. It is administered in a hospital or clinic by a specially trained health care professional. Talk to your pediatrician regarding the use of this medicine in children. Special care may be needed. Overdosage: If you think you have taken too much of this medicine contact a poison control center or emergency room at once. NOTE: This medicine is only for you. Do not share this medicine with others. What if I miss a dose? It is important not to miss your dose. Call your doctor or health care professional if you are unable to keep an appointment. What may interact with this medication? Do not take this medicine with any of the following medications: live virus vaccines This medicine may also interact with the following medications: antiviral medicines for hepatitis, HIV or AIDS certain antibiotics like erythromycin and clarithromycin certain medicines for fungal infections like ketoconazole and itraconazole certain medicines for seizures like carbamazepine, phenobarbital, phenytoin gemfibrozil nefazodone rifampin St. John's wort This list may not describe all possible interactions. Give your health care provider a list of all the medicines, herbs, non-prescription drugs, or dietary supplements you use. Also tell them if you smoke, drink alcohol, or use illegal drugs. Some items may interact with your medicine. What should I watch for while using this medication? Your condition will be monitored carefully while you are receiving this medicine. You  will need important blood work done while you are taking this medicine. This medicine can cause serious allergic reactions. To reduce your risk you will need to take other medicine(s) before treatment with this medicine. If you experience allergic reactions like skin rash, itching or hives, swelling of the face, lips, or tongue, tell your doctor or health care professional right away. In some cases, you may be given additional medicines to help with side effects. Follow all directions for their use. This drug may make you feel generally unwell. This is not uncommon, as chemotherapy can affect healthy cells as well as cancer cells. Report any side effects. Continue your course of treatment even though you feel ill unless your doctor tells you to stop. Call your doctor or health care professional for advice if you get a fever, chills or sore throat, or other symptoms of a cold or flu. Do not treat yourself. This drug decreases your body's ability to fight infections. Try to avoid being around people who are sick. This medicine may increase your risk to bruise or bleed. Call your doctor or health care professional if you notice any unusual bleeding. Be careful brushing and flossing your teeth or using a toothpick because you may get an infection or bleed more easily. If you have any dental work done, tell your dentist you are receiving this medicine. Avoid taking products that contain aspirin, acetaminophen, ibuprofen, naproxen, or ketoprofen unless instructed by  your doctor. These medicines may hide a fever. Do not become pregnant while taking this medicine. Women should inform their doctor if they wish to become pregnant or think they might be pregnant. There is a potential for serious side effects to an unborn child. Talk to your health care professional or pharmacist for more information. Do not breast-feed an infant while taking this medicine. Men are advised not to father a child while receiving this  medicine. This product may contain alcohol. Ask your pharmacist or healthcare provider if this medicine contains alcohol. Be sure to tell all healthcare providers you are taking this medicine. Certain medicines, like metronidazole and disulfiram, can cause an unpleasant reaction when taken with alcohol. The reaction includes flushing, headache, nausea, vomiting, sweating, and increased thirst. The reaction can last from 30 minutes to several hours. What side effects may I notice from receiving this medication? Side effects that you should report to your doctor or health care professional as soon as possible: allergic reactions like skin rash, itching or hives, swelling of the face, lips, or tongue breathing problems changes in vision fast, irregular heartbeat high or low blood pressure mouth sores pain, tingling, numbness in the hands or feet signs of decreased platelets or bleeding - bruising, pinpoint red spots on the skin, black, tarry stools, blood in the urine signs of decreased red blood cells - unusually weak or tired, feeling faint or lightheaded, falls signs of infection - fever or chills, cough, sore throat, pain or difficulty passing urine signs and symptoms of liver injury like dark yellow or brown urine; general ill feeling or flu-like symptoms; light-colored stools; loss of appetite; nausea; right upper belly pain; unusually weak or tired; yellowing of the eyes or skin swelling of the ankles, feet, hands unusually slow heartbeat Side effects that usually do not require medical attention (report to your doctor or health care professional if they continue or are bothersome): diarrhea hair loss loss of appetite muscle or joint pain nausea, vomiting pain, redness, or irritation at site where injected tiredness This list may not describe all possible side effects. Call your doctor for medical advice about side effects. You may report side effects to FDA at 1-800-FDA-1088. Where  should I keep my medication? This drug is given in a hospital or clinic and will not be stored at home. NOTE: This sheet is a summary. It may not cover all possible information. If you have questions about this medicine, talk to your doctor, pharmacist, or health care provider.  2022 Elsevier/Gold Standard (2020-10-07 00:00:00)  Carboplatin injection What is this medication? CARBOPLATIN (KAR boe pla tin) is a chemotherapy drug. It targets fast dividing cells, like cancer cells, and causes these cells to die. This medicine is used to treat ovarian cancer and many other cancers. This medicine may be used for other purposes; ask your health care provider or pharmacist if you have questions. COMMON BRAND NAME(S): Paraplatin What should I tell my care team before I take this medication? They need to know if you have any of these conditions: blood disorders hearing problems kidney disease recent or ongoing radiation therapy an unusual or allergic reaction to carboplatin, cisplatin, other chemotherapy, other medicines, foods, dyes, or preservatives pregnant or trying to get pregnant breast-feeding How should I use this medication? This drug is usually given as an infusion into a vein. It is administered in a hospital or clinic by a specially trained health care professional. Talk to your pediatrician regarding the use of this medicine in  children. Special care may be needed. Overdosage: If you think you have taken too much of this medicine contact a poison control center or emergency room at once. NOTE: This medicine is only for you. Do not share this medicine with others. What if I miss a dose? It is important not to miss a dose. Call your doctor or health care professional if you are unable to keep an appointment. What may interact with this medication? medicines for seizures medicines to increase blood counts like filgrastim, pegfilgrastim, sargramostim some antibiotics like amikacin,  gentamicin, neomycin, streptomycin, tobramycin vaccines Talk to your doctor or health care professional before taking any of these medicines: acetaminophen aspirin ibuprofen ketoprofen naproxen This list may not describe all possible interactions. Give your health care provider a list of all the medicines, herbs, non-prescription drugs, or dietary supplements you use. Also tell them if you smoke, drink alcohol, or use illegal drugs. Some items may interact with your medicine. What should I watch for while using this medication? Your condition will be monitored carefully while you are receiving this medicine. You will need important blood work done while you are taking this medicine. This drug may make you feel generally unwell. This is not uncommon, as chemotherapy can affect healthy cells as well as cancer cells. Report any side effects. Continue your course of treatment even though you feel ill unless your doctor tells you to stop. In some cases, you may be given additional medicines to help with side effects. Follow all directions for their use. Call your doctor or health care professional for advice if you get a fever, chills or sore throat, or other symptoms of a cold or flu. Do not treat yourself. This drug decreases your body's ability to fight infections. Try to avoid being around people who are sick. This medicine may increase your risk to bruise or bleed. Call your doctor or health care professional if you notice any unusual bleeding. Be careful brushing and flossing your teeth or using a toothpick because you may get an infection or bleed more easily. If you have any dental work done, tell your dentist you are receiving this medicine. Avoid taking products that contain aspirin, acetaminophen, ibuprofen, naproxen, or ketoprofen unless instructed by your doctor. These medicines may hide a fever. Do not become pregnant while taking this medicine. Women should inform their doctor if they wish  to become pregnant or think they might be pregnant. There is a potential for serious side effects to an unborn child. Talk to your health care professional or pharmacist for more information. Do not breast-feed an infant while taking this medicine. What side effects may I notice from receiving this medication? Side effects that you should report to your doctor or health care professional as soon as possible: allergic reactions like skin rash, itching or hives, swelling of the face, lips, or tongue signs of infection - fever or chills, cough, sore throat, pain or difficulty passing urine signs of decreased platelets or bleeding - bruising, pinpoint red spots on the skin, black, tarry stools, nosebleeds signs of decreased red blood cells - unusually weak or tired, fainting spells, lightheadedness breathing problems changes in hearing changes in vision chest pain high blood pressure low blood counts - This drug may decrease the number of white blood cells, red blood cells and platelets. You may be at increased risk for infections and bleeding. nausea and vomiting pain, swelling, redness or irritation at the injection site pain, tingling, numbness in the hands or feet  problems with balance, talking, walking trouble passing urine or change in the amount of urine Side effects that usually do not require medical attention (report to your doctor or health care professional if they continue or are bothersome): hair loss loss of appetite metallic taste in the mouth or changes in taste This list may not describe all possible side effects. Call your doctor for medical advice about side effects. You may report side effects to FDA at 1-800-FDA-1088. Where should I keep my medication? This drug is given in a hospital or clinic and will not be stored at home. NOTE: This sheet is a summary. It may not cover all possible information. If you have questions about this medicine, talk to your doctor, pharmacist,  or health care provider.  2022 Elsevier/Gold Standard (2007-06-28 00:00:00)

## 2021-01-05 NOTE — Progress Notes (Signed)
Nutrition Assessment   Reason for Assessment:   Referral for weight loss    ASSESSMENT:  55 year old female with new diagnosis of non-small cell lung cancer.  Past medical history of smoking, COPD, GERD, HLD.  Patient starting concurrent chemotherapy and radiation.    Met with patient during infusion.  Patient reports ~13 lb weight loss in the last month month, 25 lb in last 6 months.  Says that she recently stopped smoking.  Reports that she eats when she gets hungry.  Breakfast is usually eggs sometimes cereal.  Sometimes skips lunch.  Supper is usually meat and vegetables.  Has been drinking ensure 1 time a day (ensure complete).     Medications:  MVI, compazine   Labs: reviewed   Anthropometrics:   Height: 61 inches Weight: 100 lb 11/30 UBW: ~126 lb BMI: 19 12% weight loss in the last month, significant  Estimated Energy Needs  Kcals: 1350-1575 Protein: 68-79 g Fluid: 1.3 L   NUTRITION DIAGNOSIS: Unintentional weight loss related to cancer as evidenced by 12% weight loss in the last month   INTERVENTION:  Discussed importance of good nutrition during treatment and weight maintenance.   Discussed ways to add calories and protein to current diet.  Handout provided Encouraged 350 calorie shakes or higher, increase to 2 per day if possible Contact information provided.   MONITORING, EVALUATION, GOAL: weight trends, intake   Next Visit: Monday, Dec 19 during infusion  Welton Bord B. Zenia Resides, [place], [place] Registered Dietitian [phone] (mobile)

## 2021-01-06 ENCOUNTER — Encounter: Payer: Self-pay | Admitting: Internal Medicine

## 2021-01-06 DIAGNOSIS — Z510 Encounter for antineoplastic radiation therapy: Secondary | ICD-10-CM | POA: Diagnosis not present

## 2021-01-06 NOTE — Progress Notes (Signed)
Oncology Nurse Navigator Documentation  Oncology Nurse Navigator Flowsheets 01/06/2021 01/02/2021 12/23/2020 12/22/2020 12/22/2020 12/22/2020 12/18/2020  Abnormal Finding Date - - - - - - 11/27/2020  Confirmed Diagnosis Date - - - - - - 12/15/2020  Diagnosis Status - - - Pending Molecular Studies Confirmed Diagnosis Complete - Pending Molecular Studies  Planned Course of Treatment - Chemo/Radiation Concurrent Chemo/Radiation Concurrent - - - Chemo/Radiation Concurrent  Phase of Treatment Radiation Radiation Radiation - - Radiation Radiation  Chemotherapy Pending- Reason: - - - - - (No Data) -  Chemotherapy Actual Start Date: - 01/05/2021 - - - - -  Radiation Actual Start Date: 01/01/2021 12/29/2020 - - - - -  Navigator Follow Up Date: 01/19/2021 01/05/2021 12/29/2020 - - 12/23/2020 12/19/2020  Navigator Follow Up Reason: Follow-up Appointment Chemotherapy Other: - - Appointment Review Appointment Review  Navigator Location [facility]  Navigator Encounter Type Clinic/MDC;Treatment Other: Other: Other: Diagnostic Results Follow-up Appt Other:  Treatment Initiated Date 01/01/2021 - 12/29/2020 - - - -  Patient Visit Type MedOnc;Other Other Other - - - -  Treatment Phase Treatment Other Other - - - Pre-Tx/Tx Discussion  Barriers/Navigation Needs Education/I spoke with patient during her 1st infusion yesterday. She was tearful.  I offered support and encouragement with her tx and we are here to help her with her care.  She was thankful for the visit.   Coordination of Care Coordination of Care - Coordination of Care Coordination of Care -  Education Other - - - - - -  Interventions Education;Psycho-Social Support Coordination of Care Coordination of Care Other Coordination of Care Coordination of Care Other  Acuity Level 4-High Needs (Greater Than 4 Barriers Identified) Level 2-Minimal Needs (1-2  Barriers Identified) Level 2-Minimal Needs (1-2 Barriers Identified) Level 2-Minimal Needs (1-2 Barriers Identified) Level 2-Minimal Needs (1-2 Barriers Identified) Level 2-Minimal Needs (1-2 Barriers Identified) Level 2-Minimal Needs (1-2 Barriers Identified)  Coordination of Care - - Other Pathology Pathology Appts -  Education Method Verbal - - - - - -  Time Spent with Patient 45 30 30 15 15 30  15

## 2021-01-07 ENCOUNTER — Other Ambulatory Visit: Payer: Self-pay

## 2021-01-07 ENCOUNTER — Ambulatory Visit
Admission: RE | Admit: 2021-01-07 | Discharge: 2021-01-07 | Disposition: A | Discharge status: Home / Self Care | Payer: Commercial Managed Care - PPO | Source: Ambulatory Visit | Attending: Radiation Oncology | Admitting: Radiation Oncology

## 2021-01-07 DIAGNOSIS — Z510 Encounter for antineoplastic radiation therapy: Secondary | ICD-10-CM | POA: Diagnosis not present

## 2021-01-07 DIAGNOSIS — C3411 Malignant neoplasm of upper lobe, right bronchus or lung: Secondary | ICD-10-CM

## 2021-01-08 ENCOUNTER — Other Ambulatory Visit: Payer: Self-pay

## 2021-01-08 ENCOUNTER — Ambulatory Visit
Admission: RE | Admit: 2021-01-08 | Discharge: 2021-01-08 | Disposition: A | Discharge status: Home / Self Care | Payer: Commercial Managed Care - PPO | Source: Ambulatory Visit | Attending: Radiation Oncology | Admitting: Radiation Oncology

## 2021-01-08 DIAGNOSIS — Z510 Encounter for antineoplastic radiation therapy: Secondary | ICD-10-CM | POA: Diagnosis not present

## 2021-01-08 NOTE — Progress Notes (Signed)
[facility] OFFICE PROGRESS NOTE  [name], NP [place] [address]  DIAGNOSIS: Stage IIIB  (T1b, N3, M0) non-small cell lung cancer, adenocarcinoma she presented with right upper lobe nodule in addition to bulky right hilar, mediastinal, and left supraclavicular lymphadenopathy  Molecular Studies: pending  PRIOR THERAPY: None  CURRENT THERAPY: Concurrent chemoradiation with carboplatin for an AUC of 2 and paclitaxel 45 mg per metered squared.  First dose expected on 01/05/2021.   INTERVAL HISTORY: Julie Nicholson 55 y.o. female returns to the clinic today for a follow-up visit. The patient was last seen in clinic on 12/18/2020.  At that point time, the patient was diagnosed with non-small cell lung cancer, adenocarcinoma.    The patient is currently undergoing concurrent chemoradiation she is status post 1 cycle.  She underwent her first cycle of chemotherapy last week and she tolerated well without any concerning adverse side effects.  She is currently undergoing radiation under the care of Dr. Sondra Come and her last treatment definitively scheduled for 02/19/2021.  The patient denies any fever or chills.  She reports that she occasionally has night sweats on and off.  The patient has been having some progressive weight loss over the last few months and she saw member the nutritionist team while in the infusion room last week.  Her weight this week is up 2 pounds. She is drinking a supplemental drink 1x per day. She is scheduled to see nutrition again on 01/19/21.  She quit smoking which has helped her appetite. She denies any chest pain, shortness of breath, or hemoptysis.  She reports very mild baseline cough which is dry.  Denies any hemoptysis at this time. She denies any nausea, vomiting, or diarrhea. She had mild constipation last week. She is trying to drink 64 ounces of water. Denies any headache or visual changes.  She started Wellbutrin for  smoking cessation and is wondering if she needs to continue this.  She is here today for evaluation and repeat blood work before undergoing cycle #2.     MEDICAL HISTORY: Past Medical History:  Diagnosis Date   Abdominal discomfort    Chronic headaches    due to allergies, sinus   COPD (chronic obstructive pulmonary disease) ([facility])    per 2012 chest xray   pt states she doesn not have this now (04/10/2013)   Deviated nasal septum    Eustachian tube dysfunction    GERD (gastroesophageal reflux disease)    occasional uses Tums / Rolaids   Hearing loss    right ear   High cholesterol    Migraine    "only once in a blue moon since RX'd allergy shots" (04/10/2013)   Pancreatitis 02/08/2013   Pneumonia    Rhinitis, allergic     ALLERGIES:  has No Known Allergies.  MEDICATIONS:  Current Outpatient Medications  Medication Sig Dispense Refill   acetaminophen (TYLENOL) 500 MG tablet Take 500 mg by mouth as needed.     albuterol (VENTOLIN HFA) 108 (90 Base) MCG/ACT inhaler Inhale 1-2 puffs into the lungs every 6 (six) hours as needed for wheezing or shortness of breath. 6.7 g 0   aspirin EC 81 MG tablet Take 81 mg by mouth at bedtime.     atorvastatin (LIPITOR) 40 MG tablet Take 40 mg by mouth at bedtime.     Biotin 5000 MCG TABS Take 5,000 mcg by mouth at bedtime.     Ca Carbonate-Mag Hydroxide (ROLAIDS PO) Take 1  tablet by mouth daily.     fluticasone (FLONASE) 50 MCG/ACT nasal spray Place 2 sprays into both nostrils daily as needed for allergies.     ibuprofen (ADVIL) 200 MG tablet Take 200 mg by mouth as needed.     Multiple Vitamins-Minerals (HAIR SKIN AND NAILS FORMULA PO) Take 1 tablet by mouth daily.     oxycodone (OXY-IR) 5 MG capsule Take 5 mg by mouth 3 (three) times daily as needed for pain.     prochlorperazine (COMPAZINE) 10 MG tablet Take 1 tablet (10 mg total) by mouth every 6 (six) hours as needed. 30 tablet 2   UBRELVY 100 MG TABS Take 100 mg by mouth daily as needed  (migraine).     buPROPion (WELLBUTRIN SR) 150 MG 12 hr tablet Take 1 tablet (150 mg total) by mouth daily for 3 days, THEN 1 tablet (150 mg total) in the morning and at bedtime for 27 days. 57 tablet 0   No current facility-administered medications for this visit.    SURGICAL HISTORY:  Past Surgical History:  Procedure Laterality Date   ABDOMINAL HYSTERECTOMY  1995   tx endometriosis, both ovaries removed   APPENDECTOMY  late 1990's   BRONCHIAL BRUSHINGS  12/15/2020   Procedure: BRONCHIAL BRUSHINGS;  Surgeon: Collene Gobble, MD;  Location: [facility];  Service: Cardiopulmonary;;   BRONCHIAL NEEDLE ASPIRATION BIOPSY  12/15/2020   Procedure: BRONCHIAL NEEDLE ASPIRATION BIOPSIES;  Surgeon: Collene Gobble, MD;  Location: [facility];  Service: Cardiopulmonary;;   CHOLECYSTECTOMY  04/10/2013   CHOLECYSTECTOMY N/A 04/10/2013   Procedure: LAPAROSCOPIC CHOLECYSTECTOMY WITH INTRAOPERATIVE CHOLANGIOGRAM;  Surgeon: Odis Hollingshead, MD;  Location: [facility];  Service: General;  Laterality: N/A;   ELECTROMAGNETIC NAVIGATION BROCHOSCOPY  12/15/2020   Procedure: ELECTROMAGNETIC NAVIGATION BRONCHOSCOPY;  Surgeon: Collene Gobble, MD;  Location: [facility] END[facility];  Service: Cardiopulmonary;;   EUS N/A 02/16/2013   Procedure: UPPER ENDOSCOPIC ULTRASOUND (EUS) LINEAR;  Surgeon: Milus Banister, MD;  Location: [facility] ENDOSCOPY;  Service: Endoscopy;  Laterality: N/A;  radial linear   KNEE ARTHROSCOPY Right 1980's   "cartilage OR"   LAPAROSCOPIC ENDOMETRIOSIS FULGURATION  1980's   MYRINGOTOMY WITH TUBE PLACEMENT Right 07/13/2018   Procedure: MYRINGOTOMY WITH TUBE PLACEMENT;  Surgeon: Margaretha Sheffield, MD;  Location: [facility];  Service: ENT;  Laterality: Right;   NASOPHARYNGOSCOPY EUSTATION TUBE BALLOON DILATION Right 07/13/2018   Procedure: NASOPHARYNGOSCOPY EUSTATION TUBE BALLOON DILATION;  Surgeon: Margaretha Sheffield, MD;  Location: [facility];  Service: ENT;  Laterality: Right;   TONSILLECTOMY AND  ADENOIDECTOMY  ~ 1980   adenoidectomy   TUBAL LIGATION  ~ [place] Right 07/13/2018   Procedure: OUTFRACTURE TURBINATE;  Surgeon: Margaretha Sheffield, MD;  Location: [facility];  Service: ENT;  Laterality: Right;   VIDEO BRONCHOSCOPY WITH ENDOBRONCHIAL ULTRASOUND N/A 12/15/2020   Procedure: ROBOTIC VIDEO BRONCHOSCOPY WITH ENDOBRONCHIAL ULTRASOUND;  Surgeon: Collene Gobble, MD;  Location: [facility];  Service: Cardiopulmonary;  Laterality: N/A;   WRIST SURGERY Left    w/plate    REVIEW OF SYSTEMS:   Review of Systems  Constitutional: Negative for appetite change, chills, fatigue, fever and unexpected weight change.  HENT: Negative for mouth sores, nosebleeds, sore throat and trouble swallowing.   Eyes: Negative for eye problems and icterus.  Respiratory: Positive for chronic dry cough.  Negative for hemoptysis, shortness of breath and wheezing.   Cardiovascular: Negative for chest pain and leg swelling.  Gastrointestinal: Negative for abdominal pain, constipation, diarrhea, nausea  and vomiting.  Genitourinary: Negative for bladder incontinence, difficulty urinating, dysuria, frequency and hematuria.   Musculoskeletal: Negative for back pain, gait problem, neck pain and neck stiffness.  Skin: Negative for itching and rash.  Neurological: Negative for dizziness, extremity weakness, gait problem, headaches, light-headedness and seizures.  Hematological: Negative for adenopathy. Does not bruise/bleed easily.  Psychiatric/Behavioral: Negative for confusion, depression and sleep disturbance. The patient is not nervous/anxious.     PHYSICAL EXAMINATION:  Blood pressure 109/72, pulse 84, temperature (!) 97 F (36.1 C), temperature source Tympanic, resp. rate 17, weight 103 lb 1 oz (46.7 kg), SpO2 95 %.  ECOG PERFORMANCE STATUS: 1  Physical Exam  Constitutional: Oriented to person, place, and time and thin appearing female and in no distress.  HENT:  Head:  Normocephalic and atraumatic.  Mouth/Throat: Oropharynx is clear and moist. No oropharyngeal exudate.  Eyes: Conjunctivae are normal. Right eye exhibits no discharge. Left eye exhibits no discharge. No scleral icterus.  Neck: Normal range of motion. Neck supple.  Cardiovascular: Normal rate, regular rhythm, normal heart sounds and intact distal pulses.   Pulmonary/Chest: Effort normal and breath sounds normal. No respiratory distress. No wheezes. No rales.  Abdominal: Soft. Bowel sounds are normal. Exhibits no distension and no mass. There is no tenderness.  Musculoskeletal: Normal range of motion. Exhibits no edema.  Lymphadenopathy:    No cervical adenopathy.  Neurological: Alert and oriented to person, place, and time. Exhibits normal muscle tone. Gait normal. Coordination normal.  Skin: Skin is warm and dry. No rash noted. Not diaphoretic. No erythema. No pallor.  Psychiatric: Mood, memory and judgment normal.  Vitals reviewed.  LABORATORY DATA: Lab Results  Component Value Date   WBC 4.3 01/12/2021   HGB 13.7 01/12/2021   HCT 39.9 01/12/2021   MCV 93.9 01/12/2021   PLT 246 01/12/2021      Chemistry      Component Value Date/Time   NA 141 01/12/2021 0905   K 3.8 01/12/2021 0905   CL 107 01/12/2021 0905   CO2 23 01/12/2021 0905   BUN 15 01/12/2021 0905   CREATININE 0.77 01/12/2021 0905      Component Value Date/Time   CALCIUM 9.6 01/12/2021 0905   ALKPHOS 111 01/12/2021 0905   AST 19 01/12/2021 0905   ALT 26 01/12/2021 0905   BILITOT 0.7 01/12/2021 0905       RADIOGRAPHIC STUDIES:  MR Brain W Wo Contrast  Result Date: 12/18/2020 CLINICAL DATA:  Lung mass.  Cancer of unknown primary, surveillance EXAM: MRI HEAD WITHOUT AND WITH CONTRAST TECHNIQUE: Multiplanar, multiecho pulse sequences of the brain and surrounding structures were obtained without and with intravenous contrast. CONTRAST:  53mL GADAVIST GADOBUTROL 1 MMOL/ML IV SOLN COMPARISON:  CT head 12/20/2003  FINDINGS: Brain: No acute infarction, hemorrhage, hydrocephalus, extra-axial collection or mass lesion. Negative for metastatic disease.  No enhancing lesions in the brain. Mild white matter changes with scattered small subcortical white matter hyperintensities bilaterally which appear chronic. Vascular: Normal arterial flow voids. Skull and upper cervical spine: Cortical exostosis left lateral occipital bone unchanged from the prior CT 2005. This appears benign. No osseous metastatic disease present. Sinuses/Orbits: Mild mucosal edema right maxillary sinus. Remaining sinuses clear. Negative orbit Other: None IMPRESSION: Negative for metastatic disease to the brain Mild white matter changes likely chronic microvascular ischemia. Electronically Signed   By: Franchot Gallo M.D.   On: 12/18/2020 09:15   DG Chest Port 1 View  Result Date: 12/15/2020 CLINICAL DATA:  Status post bronchoscopic  biopsy EXAM: PORTABLE CHEST 1 VIEW COMPARISON:  11/27/2020 chest radiograph and chest CT angiogram. FINDINGS: Stable cardiomediastinal silhouette with normal heart size and thickening of the right paratracheal stripe. No pneumothorax. No pleural effusion. Right upper lobe nodular opacity appears unchanged. No superimposed acute consolidative airspace disease. No pulmonary edema. Cholecystectomy clips are seen in the right upper quadrant of the abdomen. IMPRESSION: 1. No pneumothorax. 2. Stable known right upper lobe pulmonary nodule. 3. Stable thickening of the right paratracheal stripe compatible with known right paratracheal lymphadenopathy. Electronically Signed   By: Ilona Sorrel M.D.   On: 12/15/2020 09:32   DG C-ARM BRONCHOSCOPY  Result Date: 12/15/2020 C-ARM BRONCHOSCOPY: Fluoroscopy was utilized by the requesting physician.  No radiographic interpretation.     ASSESSMENT/PLAN:  This is a very pleasant 55 year old Caucasian female with stage IIIB (T1b, N3, M0) non-small cell lung cancer, adenocarcinoma.  She  presented with a right upper lobe spiculated nodule and right hilar, mediastinal, and left supraclavicular adenopathy.  She was diagnosed in November 2022.  There is insufficient tissue for molecular studies.  Therefore, the patient had guardant 360 performed which showed no actionable mutations.   The patient is currently undergoing concurrent chemoradiation with carboplatin for an AUC of 2 and paclitaxel 45 mg per metered squared.  The patient is status post 1 cycle and tolerated it well.  Her last day radiation is tentatively scheduled for 02/19/2021.   Labs were reviewed.  Recommend that she proceed with cycle #2 today scheduled.  We will see her back for follow-up visit in 3 weeks for evaluation before undergoing cycle #4.  Congratulated her on smoking cessation. She will reach out to her pharmacy for a refill.   She will continue to follow with nutrition. She was given ensure coupons today.   She reports drowsiness with the 50 mg of benadryl. Pharmacy has adjusted to IV cetrizine   The patient was advised to call immediately if she has any concerning symptoms in the interval. The patient voices understanding of current disease status and treatment options and is in agreement with the current care plan. All questions were answered. The patient knows to call the clinic with any problems, questions or concerns. We can certainly see the patient much sooner if necessary       No orders of the defined types were placed in this encounter.    The total time spent in the appointment was 20-29 minutes.   Julie Reveron L Krystine Pabst, PA-C 01/12/21

## 2021-01-09 ENCOUNTER — Ambulatory Visit
Admission: RE | Admit: 2021-01-09 | Discharge: 2021-01-09 | Disposition: A | Discharge status: Home / Self Care | Payer: Commercial Managed Care - PPO | Source: Ambulatory Visit | Attending: Radiation Oncology | Admitting: Radiation Oncology

## 2021-01-09 DIAGNOSIS — Z510 Encounter for antineoplastic radiation therapy: Secondary | ICD-10-CM | POA: Diagnosis not present

## 2021-01-09 MED FILL — Dexamethasone Sodium Phosphate Inj 100 MG/10ML: INTRAMUSCULAR | Qty: 1 | Status: AC

## 2021-01-12 ENCOUNTER — Inpatient Hospital Stay (HOSPITAL_BASED_OUTPATIENT_CLINIC_OR_DEPARTMENT_OTHER): Payer: Commercial Managed Care - PPO | Admitting: Physician Assistant

## 2021-01-12 ENCOUNTER — Inpatient Hospital Stay: Payer: Commercial Managed Care - PPO

## 2021-01-12 ENCOUNTER — Ambulatory Visit
Admission: RE | Admit: 2021-01-12 | Discharge: 2021-01-12 | Disposition: A | Discharge status: Home / Self Care | Payer: Commercial Managed Care - PPO | Source: Ambulatory Visit | Attending: Radiation Oncology | Admitting: Radiation Oncology

## 2021-01-12 ENCOUNTER — Other Ambulatory Visit: Payer: Self-pay

## 2021-01-12 VITALS — BP 109/72 | HR 84 | Temp 97.0°F | Resp 17 | Wt 103.1 lb

## 2021-01-12 DIAGNOSIS — C3411 Malignant neoplasm of upper lobe, right bronchus or lung: Secondary | ICD-10-CM

## 2021-01-12 DIAGNOSIS — Z510 Encounter for antineoplastic radiation therapy: Secondary | ICD-10-CM | POA: Diagnosis not present

## 2021-01-12 DIAGNOSIS — Z5111 Encounter for antineoplastic chemotherapy: Secondary | ICD-10-CM

## 2021-01-12 LAB — CMP (CANCER CENTER ONLY)
ALT: 26 U/L (ref 0–44)
AST: 19 U/L (ref 15–41)
Albumin: 3.9 g/dL (ref 3.5–5.0)
Alkaline Phosphatase: 111 U/L (ref 38–126)
Anion gap: 11 (ref 5–15)
BUN: 15 mg/dL (ref 6–20)
CO2: 23 mmol/L (ref 22–32)
Calcium: 9.6 mg/dL (ref 8.9–10.3)
Chloride: 107 mmol/L (ref 98–111)
Creatinine: 0.77 mg/dL (ref 0.44–1.00)
GFR, Estimated: >60 mL/min (ref >60)
Glucose, Bld: 88 mg/dL (ref 70–99)
Potassium: 3.8 mmol/L (ref 3.5–5.1)
Sodium: 141 mmol/L (ref 135–145)
Total Bilirubin: 0.7 mg/dL (ref 0.3–1.2)
Total Protein: 7.4 g/dL (ref 6.5–8.1)

## 2021-01-12 LAB — CBC WITH DIFFERENTIAL (CANCER CENTER ONLY)
Abs Immature Granulocytes: 0.01 10*3/uL (ref 0.00–0.07)
Basophils Absolute: 0 10*3/uL (ref 0.0–0.1)
Basophils Relative: 0 %
Eosinophils Absolute: 0.1 10*3/uL (ref 0.0–0.5)
Eosinophils Relative: 2 %
HCT: 39.9 % (ref 36.0–46.0)
Hemoglobin: 13.7 g/dL (ref 12.0–15.0)
Immature Granulocytes: 0 %
Lymphocytes Relative: 24 %
Lymphs Abs: 1 10*3/uL (ref 0.7–4.0)
MCH: 32.2 pg (ref 26.0–34.0)
MCHC: 34.3 g/dL (ref 30.0–36.0)
MCV: 93.9 fL (ref 80.0–100.0)
Monocytes Absolute: 0.2 10*3/uL (ref 0.1–1.0)
Monocytes Relative: 5 %
Neutro Abs: 3 10*3/uL (ref 1.7–7.7)
Neutrophils Relative %: 69 %
Platelet Count: 246 10*3/uL (ref 150–400)
RBC: 4.25 MIL/uL (ref 3.87–5.11)
RDW: 12.7 % (ref 11.5–15.5)
WBC Count: 4.3 10*3/uL (ref 4.0–10.5)
nRBC: 0 % (ref 0.0–0.2)

## 2021-01-12 MED ORDER — FAMOTIDINE 20 MG IN NS 100 ML IVPB
20.0000 mg | Freq: Once | INTRAVENOUS | Status: AC
  Administered 2021-01-12: 20 mg via INTRAVENOUS
  Filled 2021-01-12: qty 100

## 2021-01-12 MED ORDER — SODIUM CHLORIDE 0.9 % IV SOLN: Freq: Once | INTRAVENOUS | Status: AC

## 2021-01-12 MED ORDER — SODIUM CHLORIDE 0.9 % IV SOLN
45.0000 mg/m2 | Freq: Once | INTRAVENOUS | Status: AC
  Administered 2021-01-12: 66 mg via INTRAVENOUS
  Filled 2021-01-12: qty 11

## 2021-01-12 MED ORDER — PALONOSETRON HCL INJECTION 0.25 MG/5ML
0.2500 mg | Freq: Once | INTRAVENOUS | Status: AC
  Administered 2021-01-12: 0.25 mg via INTRAVENOUS
  Filled 2021-01-12: qty 5

## 2021-01-12 MED ORDER — SODIUM CHLORIDE 0.9 % IV SOLN
162.2000 mg | Freq: Once | INTRAVENOUS | Status: AC
  Administered 2021-01-12: 160 mg via INTRAVENOUS
  Filled 2021-01-12: qty 16

## 2021-01-12 MED ORDER — CETIRIZINE HCL 10 MG/ML IV SOLN
10.0000 mg | Freq: Once | INTRAVENOUS | Status: AC
  Administered 2021-01-12: 10 mg via INTRAVENOUS
  Filled 2021-01-12: qty 1

## 2021-01-12 MED ORDER — SODIUM CHLORIDE 0.9 % IV SOLN
10.0000 mg | Freq: Once | INTRAVENOUS | Status: AC
  Administered 2021-01-12: 10 mg via INTRAVENOUS
  Filled 2021-01-12: qty 10
  Filled 2021-01-12: qty 1

## 2021-01-12 NOTE — Progress Notes (Signed)
Pt pref trying IV Certirizine; over-sedation w/ Benadryl.  Kennith Center, Pharm.D., CPP 01/12/2021@11 :10 AM

## 2021-01-12 NOTE — Patient Instructions (Signed)
[facility]  Discharge Instructions: Thank you for choosing [facility] to provide your oncology and hematology care.   If you have a lab appointment with the [facility], please go directly to the [facility] and check in at the registration area.   Wear comfortable clothing and clothing appropriate for easy access to any Portacath or PICC line.   We strive to give you quality time with your provider. You may need to reschedule your appointment if you arrive late (15 or more minutes).  Arriving late affects you and other patients whose appointments are after yours.  Also, if you miss three or more appointments without notifying the office, you may be dismissed from the clinic at the provider's discretion.      For prescription refill requests, have your pharmacy contact our office and allow 72 hours for refills to be completed.    Today you received the following chemotherapy and/or immunotherapy agents Taxol & Carbo      To help prevent nausea and vomiting after your treatment, we encourage you to take your nausea medication as directed.  BELOW ARE SYMPTOMS THAT SHOULD BE REPORTED IMMEDIATELY: *FEVER GREATER THAN 100.4 F (38 C) OR HIGHER *CHILLS OR SWEATING *NAUSEA AND VOMITING THAT IS NOT CONTROLLED WITH YOUR NAUSEA MEDICATION *UNUSUAL SHORTNESS OF BREATH *UNUSUAL BRUISING OR BLEEDING *URINARY PROBLEMS (pain or burning when urinating, or frequent urination) *BOWEL PROBLEMS (unusual diarrhea, constipation, pain near the anus) TENDERNESS IN MOUTH AND THROAT WITH OR WITHOUT PRESENCE OF ULCERS (sore throat, sores in mouth, or a toothache) UNUSUAL RASH, SWELLING OR PAIN  UNUSUAL VAGINAL DISCHARGE OR ITCHING   Items with * indicate a potential emergency and should be followed up as soon as possible or go to the Emergency Department if any problems should occur.  Please show the CHEMOTHERAPY ALERT CARD or IMMUNOTHERAPY ALERT CARD at check-in  to the Emergency Department and triage nurse.  Should you have questions after your visit or need to cancel or reschedule your appointment, please contact [facility]  Dept: [phone]  and follow the prompts.  Office hours are 8:00 a.m. to 4:30 p.m. Monday - Friday. Please note that voicemails left after 4:00 p.m. may not be returned until the following business day.  We are closed weekends and major holidays. You have access to a nurse at all times for urgent questions. Please call the main number to the clinic Dept: [phone] and follow the prompts.   For any non-urgent questions, you may also contact your provider using MyChart. We now offer e-Visits for anyone 57 and older to request care online for non-urgent symptoms. For details visit [email or web address].   Also download the MyChart app! Go to the app store, search "MyChart", open the app, select [facility], and log in with your MyChart username and password.  Due to Covid, a mask is required upon entering the hospital/clinic. If you do not have a mask, one will be given to you upon arrival. For doctor visits, patients may have 1 support person aged 57 or older with them. For treatment visits, patients cannot have anyone with them due to current Covid guidelines and our immunocompromised population.

## 2021-01-13 ENCOUNTER — Encounter: Payer: Self-pay | Admitting: Internal Medicine

## 2021-01-13 ENCOUNTER — Other Ambulatory Visit: Payer: Self-pay

## 2021-01-13 ENCOUNTER — Encounter: Payer: Self-pay | Admitting: Pharmacist

## 2021-01-13 ENCOUNTER — Other Ambulatory Visit: Payer: Self-pay | Admitting: Emergency Medicine

## 2021-01-13 ENCOUNTER — Ambulatory Visit
Admission: RE | Admit: 2021-01-13 | Discharge: 2021-01-13 | Disposition: A | Discharge status: Home / Self Care | Payer: Commercial Managed Care - PPO | Source: Ambulatory Visit | Attending: Radiation Oncology | Admitting: Radiation Oncology

## 2021-01-13 DIAGNOSIS — C3411 Malignant neoplasm of upper lobe, right bronchus or lung: Secondary | ICD-10-CM

## 2021-01-13 DIAGNOSIS — Z510 Encounter for antineoplastic radiation therapy: Secondary | ICD-10-CM | POA: Diagnosis not present

## 2021-01-13 MED ORDER — SONAFINE EX EMUL
1.0000 "application " | Freq: Once | CUTANEOUS | Status: AC
  Administered 2021-01-13: 1 via TOPICAL

## 2021-01-14 ENCOUNTER — Ambulatory Visit
Admission: RE | Admit: 2021-01-14 | Discharge: 2021-01-14 | Disposition: A | Discharge status: Home / Self Care | Payer: Commercial Managed Care - PPO | Source: Ambulatory Visit | Attending: Radiation Oncology | Admitting: Radiation Oncology

## 2021-01-14 DIAGNOSIS — Z510 Encounter for antineoplastic radiation therapy: Secondary | ICD-10-CM | POA: Diagnosis not present

## 2021-01-14 NOTE — Telephone Encounter (Signed)
Please advise on refill request

## 2021-01-15 ENCOUNTER — Encounter: Payer: Self-pay | Admitting: Emergency Medicine

## 2021-01-15 ENCOUNTER — Ambulatory Visit (INDEPENDENT_AMBULATORY_CARE_PROVIDER_SITE_OTHER): Payer: Commercial Managed Care - PPO | Admitting: Emergency Medicine

## 2021-01-15 ENCOUNTER — Other Ambulatory Visit: Payer: Self-pay

## 2021-01-15 ENCOUNTER — Ambulatory Visit
Admission: RE | Admit: 2021-01-15 | Discharge: 2021-01-15 | Disposition: A | Discharge status: Home / Self Care | Payer: Commercial Managed Care - PPO | Source: Ambulatory Visit | Attending: Radiation Oncology | Admitting: Radiation Oncology

## 2021-01-15 DIAGNOSIS — K219 Gastro-esophageal reflux disease without esophagitis: Secondary | ICD-10-CM | POA: Diagnosis not present

## 2021-01-15 DIAGNOSIS — C3411 Malignant neoplasm of upper lobe, right bronchus or lung: Secondary | ICD-10-CM | POA: Diagnosis not present

## 2021-01-15 DIAGNOSIS — Z720 Tobacco use: Secondary | ICD-10-CM | POA: Diagnosis not present

## 2021-01-15 DIAGNOSIS — Z510 Encounter for antineoplastic radiation therapy: Secondary | ICD-10-CM | POA: Diagnosis not present

## 2021-01-15 MED ORDER — FAMOTIDINE 20 MG PO TABS: 20.0000 mg | ORAL_TABLET | Freq: Two times a day (BID) | ORAL | 2 refills | Status: DC

## 2021-01-15 MED ORDER — BUPROPION HCL ER (SR) 150 MG PO TB12: 150.0000 mg | ORAL_TABLET | Freq: Two times a day (BID) | ORAL | 5 refills | Status: DC

## 2021-01-15 NOTE — Assessment & Plan Note (Signed)
She has noticed some increased GERD symptoms since she started chemoradiation.  We will try treating this with Pepcid.  Start with once a day but she can increase it to twice a day if more effective.

## 2021-01-15 NOTE — Patient Instructions (Signed)
Follow with Dr. Julien Nordmann and Dr. Sondra Come as planned. Repeat CT chest as per their plans Congratulations on stopping smoking. We will continue your Wellbutrin as you have been taking it. Try starting Pepcid 20 mg once daily to help with your acid reflux.  You can increase this to twice a day if you are still having problems. Continue your fluticasone nasal spray, 2 sprays each nostril each evening Follow with Dr Lamonte Sakai in 6 months or sooner if you have any problems

## 2021-01-15 NOTE — Assessment & Plan Note (Signed)
Congratulated her on smoking cessation.  She benefited from Wellbutrin and we will continue it.  She may be able to come off of it at some point in the future.  We will reassess in 6 months.  She is at risk for COPD but is not having any dyspnea, never needs her albuterol.  I think we can defer PFT for now.  We will get these in the future if symptoms begin to evolve

## 2021-01-15 NOTE — Progress Notes (Signed)
Subjective:    Patient ID: Julie Nicholson, female    DOB: 10/22/1965, 55 y.o.   MRN: [id number]  HPI 55 year old woman with history of tobacco use (30 pack years), allergic rhinitis/sinusitis, hyperlipidemia, GERD.  Also with a history of COPD not on maintenance therapy. She is referred today for evaluation of an abnormal CT scan of the chest.  She was in the emergency department 11/27/2020 with chest discomfort.  She describes this as associated with cough after a URI. Was treated with steroids and abx.  She was evaluated in the emergency department 11/27/2020 and CT chest was abnormal as below with bulky mediastinal hilar adenopathy, 11 mm right upper lobe nodule.  She saw oncology on 10/31, discussed the concern for possible primary lung cancer.  A PET scan and MRI brain were both ordered.  Cough is better. No blood. Clear mucous - all improved. Uses her albuterol very rarely.   CT-PA 11/27/2020 reviewed by me showed bulky mediastinal adenopathy, right paratracheal lymphadenopathy, subcarinal lymphadenopathy.  There is an 11 mm right upper lobe nodular area with some tubular soft tissue leading to the nodule, question mucous plugging or debris.  No evidence of pulmonary embolism  ROV 01/15/21 --pleasant 55 year old woman with history tobacco use, allergies, GERD, COPD.  We met in November for an abnormal CT scan of the chest that showed bulky mediastinal lymphadenopathy and 11 mm right upper lobe nodule.  She underwent bronchoscopy EBUS on 12/15/2020 that showed non-small cell lung cancer most consistent with an adenocarcinoma.  She is seeing oncology and radiation oncology > starting chemoradiation.  Today she reports. She has stopped smoking, benefited from Wellbutrin and would like to continue it.  Her breathing is good - no dyspnea. She has not needed any albuterol.     Review of Systems As per HPI  Past Medical History:  Diagnosis Date   Abdominal discomfort    Chronic headaches     due to allergies, sinus   COPD (chronic obstructive pulmonary disease) ([place])    per 2012 chest xray   pt states she doesn not have this now (04/10/2013)   Deviated nasal septum    Eustachian tube dysfunction    GERD (gastroesophageal reflux disease)    occasional uses Tums / Rolaids   Hearing loss    right ear   High cholesterol    Migraine    "only once in a blue moon since RX'd allergy shots" (04/10/2013)   Pancreatitis 02/08/2013   Pneumonia    Rhinitis, allergic      Family History  Problem Relation Age of Onset   Diabetes Father    Diabetes Mother    COPD Mother    COPD Maternal Grandfather    COPD Maternal Aunt    COPD Maternal Uncle      Social History   Socioeconomic History   Marital status: Married    Spouse name: Not on file   Number of children: Not on file   Years of education: Not on file   Highest education level: Not on file  Occupational History   Not on file  Tobacco Use   Smoking status: Former    Packs/day: 1.00    Years: 41.00    Pack years: 41.00    Types: Cigarettes    Quit date: 12/23/2020    Years since quitting: 0.0   Smokeless tobacco: Never  Vaping Use   Vaping Use: Never used  Substance and Sexual Activity   Alcohol use: Not Currently  Comment: occasional   Drug use: No   Sexual activity: Yes    Birth control/protection: Surgical    Comment: Hysterectomy  Other Topics Concern   Not on file  Social History Narrative   Not on file   Social Determinants of Health   Financial Resource Strain: Not on file  Food Insecurity: Not on file  Transportation Needs: Not on file  Physical Activity: Not on file  Stress: Not on file  Social Connections: Not on file  Intimate Partner Violence: Not on file     Allergies  Allergen Reactions   Benadryl [Diphenhydramine] Other (See Comments)    Oversedation - 50 mg IV given 01/05/21; switched to IV Certirizine.     Outpatient Medications Prior to Visit  Medication Sig Dispense  Refill   acetaminophen (TYLENOL) 500 MG tablet Take 500 mg by mouth as needed.     albuterol (VENTOLIN HFA) 108 (90 Base) MCG/ACT inhaler Inhale 1-2 puffs into the lungs every 6 (six) hours as needed for wheezing or shortness of breath. 6.7 g 0   aspirin EC 81 MG tablet Take 81 mg by mouth at bedtime.     atorvastatin (LIPITOR) 40 MG tablet Take 40 mg by mouth at bedtime.     Biotin 5000 MCG TABS Take 5,000 mcg by mouth at bedtime.     Ca Carbonate-Mag Hydroxide (ROLAIDS PO) Take 1 tablet by mouth daily.     fluticasone (FLONASE) 50 MCG/ACT nasal spray Place 2 sprays into both nostrils daily as needed for allergies.     ibuprofen (ADVIL) 200 MG tablet Take 200 mg by mouth as needed.     Multiple Vitamins-Minerals (HAIR SKIN AND NAILS FORMULA PO) Take 1 tablet by mouth daily.     oxycodone (OXY-IR) 5 MG capsule Take 5 mg by mouth 3 (three) times daily as needed for pain.     prochlorperazine (COMPAZINE) 10 MG tablet Take 1 tablet (10 mg total) by mouth every 6 (six) hours as needed. 30 tablet 2   UBRELVY 100 MG TABS Take 100 mg by mouth daily as needed (migraine).     buPROPion (WELLBUTRIN SR) 150 MG 12 hr tablet Take 1 tablet (150 mg total) by mouth daily for 3 days, THEN 1 tablet (150 mg total) in the morning and at bedtime for 27 days. (Patient not taking: Reported on 01/15/2021) 57 tablet 0   No facility-administered medications prior to visit.        Objective:   Physical Exam Vitals:   01/15/21 1636  BP: 122/76  Pulse: 85  Temp: 97.9 F (36.6 C)  TempSrc: Oral  SpO2: 97%  Weight: 105 lb (47.6 kg)  Height: '5\' 1"'  (1.549 m)    Gen: Pleasant, well-nourished, in no distress,  normal affect  ENT: No lesions,  mouth clear,  oropharynx clear, no postnasal drip  Neck: No JVD, no stridor  Lungs: No use of accessory muscles, no crackles or wheezing on normal respiration, no wheeze on forced expiration  Cardiovascular: RRR, heart sounds normal, no murmur or gallops, no peripheral  edema  Musculoskeletal: No deformities, no cyanosis or clubbing  Neuro: alert, awake, non focal  Skin: Warm, no lesions or rash       Assessment & Plan:   Primary adenocarcinoma of upper lobe of right lung (HCC) She is receiving concurrent chemoradiation, following with Dr. Julien Nordmann and Dr. Sondra Come.  Plan for repeat imaging as per their schedule.  GERD (gastroesophageal reflux disease) She has noticed some increased GERD  symptoms since she started chemoradiation.  We will try treating this with Pepcid.  Start with once a day but she can increase it to twice a day if more effective.  Tobacco use Congratulated her on smoking cessation.  She benefited from Wellbutrin and we will continue it.  She may be able to come off of it at some point in the future.  We will reassess in 6 months.  She is at risk for COPD but is not having any dyspnea, never needs her albuterol.  I think we can defer PFT for now.  We will get these in the future if symptoms begin to evolve   Baltazar Apo, MD, PhD 01/15/2021, 4:56 PM [facility] [phone] or if no answer before 7:00PM call [phone] For any issues after 7:00PM please call eLink [phone]

## 2021-01-15 NOTE — Assessment & Plan Note (Signed)
She is receiving concurrent chemoradiation, following with Dr. Julien Nordmann and Dr. Sondra Come.  Plan for repeat imaging as per their schedule.

## 2021-01-16 ENCOUNTER — Ambulatory Visit
Admission: RE | Admit: 2021-01-16 | Discharge: 2021-01-16 | Disposition: A | Discharge status: Home / Self Care | Payer: Commercial Managed Care - PPO | Source: Ambulatory Visit | Attending: Radiation Oncology | Admitting: Radiation Oncology

## 2021-01-16 DIAGNOSIS — Z510 Encounter for antineoplastic radiation therapy: Secondary | ICD-10-CM | POA: Diagnosis not present

## 2021-01-16 MED FILL — Dexamethasone Sodium Phosphate Inj 100 MG/10ML: INTRAMUSCULAR | Qty: 1 | Status: AC

## 2021-01-19 ENCOUNTER — Other Ambulatory Visit: Payer: Self-pay | Admitting: Radiation Oncology

## 2021-01-19 ENCOUNTER — Inpatient Hospital Stay: Payer: Commercial Managed Care - PPO

## 2021-01-19 ENCOUNTER — Inpatient Hospital Stay: Payer: Commercial Managed Care - PPO | Admitting: Internal Medicine

## 2021-01-19 ENCOUNTER — Other Ambulatory Visit: Payer: Self-pay

## 2021-01-19 ENCOUNTER — Ambulatory Visit
Admission: RE | Admit: 2021-01-19 | Discharge: 2021-01-19 | Disposition: A | Discharge status: Home / Self Care | Payer: Commercial Managed Care - PPO | Source: Ambulatory Visit | Attending: Radiation Oncology | Admitting: Radiation Oncology

## 2021-01-19 VITALS — BP 116/70 | HR 93 | Temp 98.0°F | Resp 18 | Wt 104.2 lb

## 2021-01-19 DIAGNOSIS — C3411 Malignant neoplasm of upper lobe, right bronchus or lung: Secondary | ICD-10-CM

## 2021-01-19 DIAGNOSIS — Z510 Encounter for antineoplastic radiation therapy: Secondary | ICD-10-CM | POA: Diagnosis not present

## 2021-01-19 LAB — CMP (CANCER CENTER ONLY)
ALT: 45 U/L — ABNORMAL HIGH (ref 0–44)
AST: 26 U/L (ref 15–41)
Albumin: 3.8 g/dL (ref 3.5–5.0)
Alkaline Phosphatase: 109 U/L (ref 38–126)
Anion gap: 10 (ref 5–15)
BUN: 11 mg/dL (ref 6–20)
CO2: 25 mmol/L (ref 22–32)
Calcium: 9.6 mg/dL (ref 8.9–10.3)
Chloride: 107 mmol/L (ref 98–111)
Creatinine: 0.79 mg/dL (ref 0.44–1.00)
GFR, Estimated: >60 mL/min (ref >60)
Glucose, Bld: 102 mg/dL — ABNORMAL HIGH (ref 70–99)
Potassium: 3.9 mmol/L (ref 3.5–5.1)
Sodium: 142 mmol/L (ref 135–145)
Total Bilirubin: 1.1 mg/dL (ref 0.3–1.2)
Total Protein: 7.2 g/dL (ref 6.5–8.1)

## 2021-01-19 LAB — CBC WITH DIFFERENTIAL (CANCER CENTER ONLY)
Abs Immature Granulocytes: 0 10*3/uL (ref 0.00–0.07)
Basophils Absolute: 0 10*3/uL (ref 0.0–0.1)
Basophils Relative: 0 %
Eosinophils Absolute: 0.1 10*3/uL (ref 0.0–0.5)
Eosinophils Relative: 2 %
HCT: 39.9 % (ref 36.0–46.0)
Hemoglobin: 13.5 g/dL (ref 12.0–15.0)
Immature Granulocytes: 0 %
Lymphocytes Relative: 27 %
Lymphs Abs: 0.7 10*3/uL (ref 0.7–4.0)
MCH: 32.3 pg (ref 26.0–34.0)
MCHC: 33.8 g/dL (ref 30.0–36.0)
MCV: 95.5 fL (ref 80.0–100.0)
Monocytes Absolute: 0.2 10*3/uL (ref 0.1–1.0)
Monocytes Relative: 9 %
Neutro Abs: 1.7 10*3/uL (ref 1.7–7.7)
Neutrophils Relative %: 62 %
Platelet Count: 247 10*3/uL (ref 150–400)
RBC: 4.18 MIL/uL (ref 3.87–5.11)
RDW: 13.1 % (ref 11.5–15.5)
Smear Review: NORMAL
WBC Count: 2.8 10*3/uL — ABNORMAL LOW (ref 4.0–10.5)
nRBC: 0 % (ref 0.0–0.2)

## 2021-01-19 MED ORDER — FAMOTIDINE 20 MG IN NS 100 ML IVPB
20.0000 mg | Freq: Once | INTRAVENOUS | Status: AC
  Administered 2021-01-19: 10:00:00 20 mg via INTRAVENOUS
  Filled 2021-01-19: qty 100

## 2021-01-19 MED ORDER — SODIUM CHLORIDE 0.9 % IV SOLN
10.0000 mg | Freq: Once | INTRAVENOUS | Status: AC
  Administered 2021-01-19: 11:00:00 10 mg via INTRAVENOUS
  Filled 2021-01-19: qty 10

## 2021-01-19 MED ORDER — PALONOSETRON HCL INJECTION 0.25 MG/5ML
0.2500 mg | Freq: Once | INTRAVENOUS | Status: AC
  Administered 2021-01-19: 10:00:00 0.25 mg via INTRAVENOUS
  Filled 2021-01-19: qty 5

## 2021-01-19 MED ORDER — SODIUM CHLORIDE 0.9 % IV SOLN: Freq: Once | INTRAVENOUS | Status: AC

## 2021-01-19 MED ORDER — SODIUM CHLORIDE 0.9 % IV SOLN
45.0000 mg/m2 | Freq: Once | INTRAVENOUS | Status: AC
  Administered 2021-01-19: 11:00:00 66 mg via INTRAVENOUS
  Filled 2021-01-19: qty 11

## 2021-01-19 MED ORDER — SUCRALFATE 1 G PO TABS: ORAL_TABLET | ORAL | 0 refills | Status: DC

## 2021-01-19 MED ORDER — SODIUM CHLORIDE 0.9 % IV SOLN
162.2000 mg | Freq: Once | INTRAVENOUS | Status: AC
  Administered 2021-01-19: 13:00:00 160 mg via INTRAVENOUS
  Filled 2021-01-19: qty 16

## 2021-01-19 MED ORDER — CETIRIZINE HCL 10 MG/ML IV SOLN
10.0000 mg | Freq: Once | INTRAVENOUS | Status: AC
  Administered 2021-01-19: 10:00:00 10 mg via INTRAVENOUS
  Filled 2021-01-19: qty 1

## 2021-01-19 NOTE — Patient Instructions (Signed)
[facility]  Discharge Instructions: Thank you for choosing [facility] to provide your oncology and hematology care.   If you have a lab appointment with the Cancer Center, please go directly to the Cancer Center and check in at the registration area.   Wear comfortable clothing and clothing appropriate for easy access to any Portacath or PICC line.   We strive to give you quality time with your provider. You may need to reschedule your appointment if you arrive late (15 or more minutes).  Arriving late affects you and other patients whose appointments are after yours.  Also, if you miss three or more appointments without notifying the office, you may be dismissed from the clinic at the provider's discretion.      For prescription refill requests, have your pharmacy contact our office and allow 72 hours for refills to be completed.    Today you received the following chemotherapy and/or immunotherapy agents Taxol and Carboplatin       To help prevent nausea and vomiting after your treatment, we encourage you to take your nausea medication as directed.  BELOW ARE SYMPTOMS THAT SHOULD BE REPORTED IMMEDIATELY: *FEVER GREATER THAN 100.4 F (38 C) OR HIGHER *CHILLS OR SWEATING *NAUSEA AND VOMITING THAT IS NOT CONTROLLED WITH YOUR NAUSEA MEDICATION *UNUSUAL SHORTNESS OF BREATH *UNUSUAL BRUISING OR BLEEDING *URINARY PROBLEMS (pain or burning when urinating, or frequent urination) *BOWEL PROBLEMS (unusual diarrhea, constipation, pain near the anus) TENDERNESS IN MOUTH AND THROAT WITH OR WITHOUT PRESENCE OF ULCERS (sore throat, sores in mouth, or a toothache) UNUSUAL RASH, SWELLING OR PAIN  UNUSUAL VAGINAL DISCHARGE OR ITCHING   Items with * indicate a potential emergency and should be followed up as soon as possible or go to the Emergency Department if any problems should occur.  Please show the CHEMOTHERAPY ALERT CARD or IMMUNOTHERAPY ALERT CARD at  check-in to the Emergency Department and triage nurse.  Should you have questions after your visit or need to cancel or reschedule your appointment, please contact [facility]  Dept: [phone]  and follow the prompts.  Office hours are 8:00 a.m. to 4:30 p.m. Monday - Friday. Please note that voicemails left after 4:00 p.m. may not be returned until the following business day.  We are closed weekends and major holidays. You have access to a nurse at all times for urgent questions. Please call the main number to the clinic Dept: [phone] and follow the prompts.   For any non-urgent questions, you may also contact your provider using MyChart. We now offer e-Visits for anyone 18 and older to request care online for non-urgent symptoms. For details visit [email or web address].   Also download the MyChart app! Go to the app store, search "MyChart", open the app, select [facility], and log in with your MyChart username and password.  Due to Covid, a mask is required upon entering the hospital/clinic. If you do not have a mask, one will be given to you upon arrival. For doctor visits, patients may have 1 support person aged 18 or older with them. For treatment visits, patients cannot have anyone with them due to current Covid guidelines and our immunocompromised population.   

## 2021-01-19 NOTE — Progress Notes (Signed)
Nutrition Follow-up:  Pt with non small cell lung cancer.  Patient on concurrent chemotherapy and radiation.   Met with patient during infusion. Patient reports good appetite since stopping smoking.  Reports burning in throat chest area. Has been taking pepcid and planning to discuss with radiation oncologist today.  Reports that despite the burning pain is still eating.  Drinking 1 ensure complete daily. Ate most of [place] sandwich today and chips during infusion.      Medications: reviewed  Labs: reviewed  Anthropometrics:   Weight 104 lb 4 oz today 100 lb on 11/30   NUTRITION DIAGNOSIS: Unintentional weight loss improving   INTERVENTION:  Discussed adding liquids, moisture to foods for ease of swallowing.  Discussed that carbonation may make burning worse.   Continue with high calorie shakes and increase to 2 a day if able.    MONITORING, EVALUATION, GOAL: weight trends, intake   NEXT VISIT: Monday, Jan 9 during infusion  Madylyn Insco B. Zenia Resides, [place], [place] Registered Dietitian [phone] (mobile)

## 2021-01-20 ENCOUNTER — Ambulatory Visit
Admission: RE | Admit: 2021-01-20 | Discharge: 2021-01-20 | Disposition: A | Discharge status: Home / Self Care | Payer: Commercial Managed Care - PPO | Source: Ambulatory Visit | Attending: Radiation Oncology | Admitting: Radiation Oncology

## 2021-01-20 DIAGNOSIS — Z510 Encounter for antineoplastic radiation therapy: Secondary | ICD-10-CM | POA: Diagnosis not present

## 2021-01-20 NOTE — Progress Notes (Deleted)
[name] OFFICE PROGRESS NOTE  [name], NP [place] [address]  DIAGNOSIS: Stage IIIB  (T1b, N3, M0) non-small cell lung cancer, adenocarcinoma she presented with right upper lobe nodule in addition to bulky right hilar, mediastinal, and left supraclavicular lymphadenopathy  Molecular Studies: ***  PRIOR THERAPY: None  CURRENT THERAPY: Concurrent chemoradiation with carboplatin for an AUC of 2 and paclitaxel 45 mg per metered squared.  First dose on 01/05/2021. Status post 3 cycles  INTERVAL HISTORY: Julie Nicholson 55 y.o. female returns to the clinic today for a follow-up visit. She is feeling *** today without any concerning complaints except for ***. She is undergoing concurrent chemoradiation. She is tolerating chemotherapy well.  She is currently undergoing radiation under the care of Dr. Sondra Come and her last treatment definitively scheduled for 02/19/2021.  The patient denies any fever or chills.  She reports that she occasionally has night sweats on and off.  The patient has been having some progressive weight loss over the last few months and she saw member the nutritionist team while in the infusion room last week.  Her weight this week is up *** pounds. She is drinking a supplemental drink 2x per day. She is scheduled to see nutrition again on 02/09/21.  She quit smoking which has helped her appetite. She denies any chest pain, shortness of breath, or hemoptysis.  She reports very mild baseline cough which is dry.  Denies any hemoptysis at this time. She denies any nausea, vomiting, or diarrhea. She had mild constipation last week. She is trying to drink 64 ounces of water. Denies any headache or visual changes.  She started Wellbutrin for smoking cessation and is wondering if she needs to continue this.  She is here today for evaluation and repeat blood work before undergoing cycle #2.     MEDICAL HISTORY: Past Medical History:   Diagnosis Date   Abdominal discomfort    Chronic headaches    due to allergies, sinus   COPD (chronic obstructive pulmonary disease) ([place])    per 2012 chest xray   pt states she doesn not have this now (04/10/2013)   Deviated nasal septum    Eustachian tube dysfunction    GERD (gastroesophageal reflux disease)    occasional uses Tums / Rolaids   Hearing loss    right ear   High cholesterol    Migraine    "only once in a blue moon since RX'd allergy shots" (04/10/2013)   Pancreatitis 02/08/2013   Pneumonia    Rhinitis, allergic     ALLERGIES:  is allergic to benadryl [diphenhydramine].  MEDICATIONS:  Current Outpatient Medications  Medication Sig Dispense Refill   acetaminophen (TYLENOL) 500 MG tablet Take 500 mg by mouth as needed.     albuterol (VENTOLIN HFA) 108 (90 Base) MCG/ACT inhaler Inhale 1-2 puffs into the lungs every 6 (six) hours as needed for wheezing or shortness of breath. 6.7 g 0   aspirin EC 81 MG tablet Take 81 mg by mouth at bedtime.     atorvastatin (LIPITOR) 40 MG tablet Take 40 mg by mouth at bedtime.     Biotin 5000 MCG TABS Take 5,000 mcg by mouth at bedtime.     buPROPion (WELLBUTRIN SR) 150 MG 12 hr tablet Take 1 tablet (150 mg total) by mouth 2 (two) times daily. 60 tablet 5   Ca Carbonate-Mag Hydroxide (ROLAIDS PO) Take 1 tablet by mouth daily.     famotidine (PEPCID)  20 MG tablet Take 1 tablet (20 mg total) by mouth 2 (two) times daily. 60 tablet 2   fluticasone (FLONASE) 50 MCG/ACT nasal spray Place 2 sprays into both nostrils daily as needed for allergies.     ibuprofen (ADVIL) 200 MG tablet Take 200 mg by mouth as needed.     Multiple Vitamins-Minerals (HAIR SKIN AND NAILS FORMULA PO) Take 1 tablet by mouth daily.     oxycodone (OXY-IR) 5 MG capsule Take 5 mg by mouth 3 (three) times daily as needed for pain.     prochlorperazine (COMPAZINE) 10 MG tablet Take 1 tablet (10 mg total) by mouth every 6 (six) hours as needed. 30 tablet 2   sucralfate  (CARAFATE) 1 g tablet Dissolve 1 tablet in 10 mL H20 and swallow 30 min prior to meals and bedtime. 90 tablet 0   UBRELVY 100 MG TABS Take 100 mg by mouth daily as needed (migraine).     No current facility-administered medications for this visit.    SURGICAL HISTORY:  Past Surgical History:  Procedure Laterality Date   ABDOMINAL HYSTERECTOMY  1995   tx endometriosis, both ovaries removed   APPENDECTOMY  late 1990's   BRONCHIAL BRUSHINGS  12/15/2020   Procedure: BRONCHIAL BRUSHINGS;  Surgeon: Collene Gobble, MD;  Location: [facility];  Service: Cardiopulmonary;;   BRONCHIAL NEEDLE ASPIRATION BIOPSY  12/15/2020   Procedure: BRONCHIAL NEEDLE ASPIRATION BIOPSIES;  Surgeon: Collene Gobble, MD;  Location: [facility];  Service: Cardiopulmonary;;   CHOLECYSTECTOMY  04/10/2013   CHOLECYSTECTOMY N/A 04/10/2013   Procedure: LAPAROSCOPIC CHOLECYSTECTOMY WITH INTRAOPERATIVE CHOLANGIOGRAM;  Surgeon: Odis Hollingshead, MD;  Location: [facility];  Service: General;  Laterality: N/A;   ELECTROMAGNETIC NAVIGATION BROCHOSCOPY  12/15/2020   Procedure: ELECTROMAGNETIC NAVIGATION BRONCHOSCOPY;  Surgeon: Collene Gobble, MD;  [facility];  Service: Cardiopulmonary;;   EUS N/A 02/16/2013   Procedure: UPPER ENDOSCOPIC ULTRASOUND (EUS) LINEAR;  Surgeon: Milus Banister, MD;  Location: [facility] ENDOSCOPY;  Service: Endoscopy;  Laterality: N/A;  radial linear   KNEE ARTHROSCOPY Right 1980's   "cartilage OR"   LAPAROSCOPIC ENDOMETRIOSIS FULGURATION  1980's   MYRINGOTOMY WITH TUBE PLACEMENT Right 07/13/2018   Procedure: MYRINGOTOMY WITH TUBE PLACEMENT;  Surgeon: Margaretha Sheffield, MD;  Location: [facility];  Service: ENT;  Laterality: Right;   NASOPHARYNGOSCOPY EUSTATION TUBE BALLOON DILATION Right 07/13/2018   Procedure: NASOPHARYNGOSCOPY EUSTATION TUBE BALLOON DILATION;  Surgeon: Margaretha Sheffield, MD;  Location: [facility];  Service: ENT;  Laterality: Right;   TONSILLECTOMY AND ADENOIDECTOMY  ~  1980   adenoidectomy   TUBAL LIGATION  ~ [place] Right 07/13/2018   Procedure: OUTFRACTURE TURBINATE;  Surgeon: Margaretha Sheffield, MD;  Location: [facility];  Service: ENT;  Laterality: Right;   VIDEO BRONCHOSCOPY WITH ENDOBRONCHIAL ULTRASOUND N/A 12/15/2020   Procedure: ROBOTIC VIDEO BRONCHOSCOPY WITH ENDOBRONCHIAL ULTRASOUND;  Surgeon: Collene Gobble, MD;  Location: [facility];  Service: Cardiopulmonary;  Laterality: N/A;   WRIST SURGERY Left    w/plate    REVIEW OF SYSTEMS:   Review of Systems  Constitutional: Negative for appetite change, chills, fatigue, fever and unexpected weight change.  HENT:   Negative for mouth sores, nosebleeds, sore throat and trouble swallowing.   Eyes: Negative for eye problems and icterus.  Respiratory: Negative for cough, hemoptysis, shortness of breath and wheezing.   Cardiovascular: Negative for chest pain and leg swelling.  Gastrointestinal: Negative for abdominal pain, constipation, diarrhea, nausea and vomiting.  Genitourinary: Negative  for bladder incontinence, difficulty urinating, dysuria, frequency and hematuria.   Musculoskeletal: Negative for back pain, gait problem, neck pain and neck stiffness.  Skin: Negative for itching and rash.  Neurological: Negative for dizziness, extremity weakness, gait problem, headaches, light-headedness and seizures.  Hematological: Negative for adenopathy. Does not bruise/bleed easily.  Psychiatric/Behavioral: Negative for confusion, depression and sleep disturbance. The patient is not nervous/anxious.     PHYSICAL EXAMINATION:  There were no vitals taken for this visit.  ECOG PERFORMANCE STATUS: {CHL ONC ECOG [id number]  Physical Exam  Constitutional: Oriented to person, place, and time and well-developed, well-nourished, and in no distress. No distress.  HENT:  Head: Normocephalic and atraumatic.  Mouth/Throat: Oropharynx is clear and moist. No oropharyngeal exudate.   Eyes: Conjunctivae are normal. Right eye exhibits no discharge. Left eye exhibits no discharge. No scleral icterus.  Neck: Normal range of motion. Neck supple.  Cardiovascular: Normal rate, regular rhythm, normal heart sounds and intact distal pulses.   Pulmonary/Chest: Effort normal and breath sounds normal. No respiratory distress. No wheezes. No rales.  Abdominal: Soft. Bowel sounds are normal. Exhibits no distension and no mass. There is no tenderness.  Musculoskeletal: Normal range of motion. Exhibits no edema.  Lymphadenopathy:    No cervical adenopathy.  Neurological: Alert and oriented to person, place, and time. Exhibits normal muscle tone. Gait normal. Coordination normal.  Skin: Skin is warm and dry. No rash noted. Not diaphoretic. No erythema. No pallor.  Psychiatric: Mood, memory and judgment normal.  Vitals reviewed.  LABORATORY DATA: Lab Results  Component Value Date   WBC 2.8 (L) 01/19/2021   HGB 13.5 01/19/2021   HCT 39.9 01/19/2021   MCV 95.5 01/19/2021   PLT 247 01/19/2021      Chemistry      Component Value Date/Time   NA 142 01/19/2021 0915   K 3.9 01/19/2021 0915   CL 107 01/19/2021 0915   CO2 25 01/19/2021 0915   BUN 11 01/19/2021 0915   CREATININE 0.79 01/19/2021 0915      Component Value Date/Time   CALCIUM 9.6 01/19/2021 0915   ALKPHOS 109 01/19/2021 0915   AST 26 01/19/2021 0915   ALT 45 (H) 01/19/2021 0915   BILITOT 1.1 01/19/2021 0915       RADIOGRAPHIC STUDIES:  No results found.   ASSESSMENT/PLAN:  This is a very pleasant 55 year old Caucasian female with stage IIIB (T1b, N3, M0) non-small cell lung cancer, adenocarcinoma.  She presented with a right upper lobe spiculated nodule and right hilar, mediastinal, and left supraclavicular adenopathy.  She was diagnosed in November 2022.  There is insufficient tissue for molecular studies.  Therefore, the patient had guardant 360 performed which showed no actionable mutations.    The  patient is currently undergoing concurrent chemoradiation with carboplatin for an AUC of 2 and paclitaxel 45 mg per metered squared.  The patient is status post 3 cycle and tolerated it well.  Her last day radiation is tentatively scheduled for 02/19/2021.   Labs were reviewed.  Recommend that she proceed with cycle #4 today scheduled.  We will see her back for a follow up visit in 2 weeks before starting cycle #6.   She will continue to follow with nutrition.  The patient was advised to call immediately if she has any concerning symptoms in the interval. The patient voices understanding of current disease status and treatment options and is in agreement with the current care plan. All questions were answered. The patient knows to call  the clinic with any problems, questions or concerns. We can certainly see the patient much sooner if necessary    No orders of the defined types were placed in this encounter.    I spent {CHL ONC TIME VISIT - OVPCH:[id number]} counseling the patient face to face. The total time spent in the appointment was {CHL ONC TIME VISIT - TMBPJ:[id number]}.  Brave Dack L Riana Tessmer, PA-C 01/20/21

## 2021-01-21 ENCOUNTER — Ambulatory Visit
Admission: RE | Admit: 2021-01-21 | Discharge: 2021-01-21 | Disposition: A | Discharge status: Home / Self Care | Payer: Commercial Managed Care - PPO | Source: Ambulatory Visit | Attending: Radiation Oncology | Admitting: Radiation Oncology

## 2021-01-21 ENCOUNTER — Other Ambulatory Visit: Payer: Self-pay

## 2021-01-21 DIAGNOSIS — Z510 Encounter for antineoplastic radiation therapy: Secondary | ICD-10-CM | POA: Diagnosis not present

## 2021-01-22 ENCOUNTER — Ambulatory Visit
Admission: RE | Admit: 2021-01-22 | Discharge: 2021-01-22 | Disposition: A | Discharge status: Home / Self Care | Payer: Commercial Managed Care - PPO | Source: Ambulatory Visit | Attending: Radiation Oncology | Admitting: Radiation Oncology

## 2021-01-22 DIAGNOSIS — Z510 Encounter for antineoplastic radiation therapy: Secondary | ICD-10-CM | POA: Diagnosis not present

## 2021-01-23 ENCOUNTER — Telehealth: Payer: Self-pay

## 2021-01-23 ENCOUNTER — Ambulatory Visit
Admission: RE | Admit: 2021-01-23 | Discharge: 2021-01-23 | Disposition: A | Discharge status: Home / Self Care | Payer: Commercial Managed Care - PPO | Source: Ambulatory Visit | Attending: Radiation Oncology | Admitting: Radiation Oncology

## 2021-01-23 ENCOUNTER — Other Ambulatory Visit: Payer: Self-pay | Admitting: Radiation Oncology

## 2021-01-23 ENCOUNTER — Other Ambulatory Visit: Payer: Self-pay

## 2021-01-23 DIAGNOSIS — C3411 Malignant neoplasm of upper lobe, right bronchus or lung: Secondary | ICD-10-CM

## 2021-01-23 DIAGNOSIS — Z510 Encounter for antineoplastic radiation therapy: Secondary | ICD-10-CM | POA: Diagnosis not present

## 2021-01-23 MED ORDER — LIDOCAINE VISCOUS HCL 2 % MT SOLN: OROMUCOSAL | 0 refills | Status: DC

## 2021-01-23 NOTE — Telephone Encounter (Signed)
Called to make patient aware that new prescription for lidocaine  was sent to pharmacy on file. Reviewed mixing instructions with patient. Patient voiced understanding.

## 2021-01-23 NOTE — Telephone Encounter (Signed)
Patient called to make MD aware that Carafate is causing her nausea and vomiting. Patient states she continues to have esophageal issues. Patient requesting a different medication to assist with swallowing issues.

## 2021-01-27 ENCOUNTER — Inpatient Hospital Stay: Payer: Commercial Managed Care - PPO

## 2021-01-27 ENCOUNTER — Encounter: Payer: Self-pay | Admitting: Internal Medicine

## 2021-01-27 ENCOUNTER — Inpatient Hospital Stay: Payer: Commercial Managed Care - PPO | Admitting: Physician Assistant

## 2021-01-27 ENCOUNTER — Inpatient Hospital Stay (HOSPITAL_BASED_OUTPATIENT_CLINIC_OR_DEPARTMENT_OTHER): Payer: Commercial Managed Care - PPO | Admitting: Physician Assistant

## 2021-01-27 ENCOUNTER — Other Ambulatory Visit: Payer: Self-pay

## 2021-01-27 ENCOUNTER — Ambulatory Visit
Admission: RE | Admit: 2021-01-27 | Discharge: 2021-01-27 | Disposition: A | Discharge status: Home / Self Care | Payer: Commercial Managed Care - PPO | Source: Ambulatory Visit | Attending: Radiation Oncology | Admitting: Radiation Oncology

## 2021-01-27 VITALS — BP 118/54 | HR 88 | Temp 97.8°F | Resp 18 | Ht 61.0 in | Wt 104.2 lb

## 2021-01-27 DIAGNOSIS — C3411 Malignant neoplasm of upper lobe, right bronchus or lung: Secondary | ICD-10-CM

## 2021-01-27 DIAGNOSIS — Z5111 Encounter for antineoplastic chemotherapy: Secondary | ICD-10-CM

## 2021-01-27 DIAGNOSIS — Z510 Encounter for antineoplastic radiation therapy: Secondary | ICD-10-CM | POA: Diagnosis not present

## 2021-01-27 LAB — CBC WITH DIFFERENTIAL (CANCER CENTER ONLY)
Abs Immature Granulocytes: 0.01 10*3/uL (ref 0.00–0.07)
Basophils Absolute: 0 10*3/uL (ref 0.0–0.1)
Basophils Relative: 1 %
Eosinophils Absolute: 0 10*3/uL (ref 0.0–0.5)
Eosinophils Relative: 1 %
HCT: 34.1 % — ABNORMAL LOW (ref 36.0–46.0)
Hemoglobin: 11.4 g/dL — ABNORMAL LOW (ref 12.0–15.0)
Immature Granulocytes: 1 %
Lymphocytes Relative: 30 %
Lymphs Abs: 0.6 10*3/uL — ABNORMAL LOW (ref 0.7–4.0)
MCH: 32.6 pg (ref 26.0–34.0)
MCHC: 33.4 g/dL (ref 30.0–36.0)
MCV: 97.4 fL (ref 80.0–100.0)
Monocytes Absolute: 0.2 10*3/uL (ref 0.1–1.0)
Monocytes Relative: 11 %
Neutro Abs: 1.2 10*3/uL — ABNORMAL LOW (ref 1.7–7.7)
Neutrophils Relative %: 56 %
Platelet Count: 190 10*3/uL (ref 150–400)
RBC: 3.5 MIL/uL — ABNORMAL LOW (ref 3.87–5.11)
RDW: 13.7 % (ref 11.5–15.5)
Smear Review: NORMAL
WBC Count: 2.2 10*3/uL — ABNORMAL LOW (ref 4.0–10.5)
nRBC: 0 % (ref 0.0–0.2)

## 2021-01-27 LAB — CMP (CANCER CENTER ONLY)
ALT: 30 U/L (ref 0–44)
AST: 19 U/L (ref 15–41)
Albumin: 4 g/dL (ref 3.5–5.0)
Alkaline Phosphatase: 99 U/L (ref 38–126)
Anion gap: 6 (ref 5–15)
BUN: 11 mg/dL (ref 6–20)
CO2: 28 mmol/L (ref 22–32)
Calcium: 9.5 mg/dL (ref 8.9–10.3)
Chloride: 106 mmol/L (ref 98–111)
Creatinine: 0.68 mg/dL (ref 0.44–1.00)
GFR, Estimated: >60 mL/min (ref >60)
Glucose, Bld: 85 mg/dL (ref 70–99)
Potassium: 3.7 mmol/L (ref 3.5–5.1)
Sodium: 140 mmol/L (ref 135–145)
Total Bilirubin: 0.5 mg/dL (ref 0.3–1.2)
Total Protein: 7 g/dL (ref 6.5–8.1)

## 2021-01-27 MED ORDER — CETIRIZINE HCL 10 MG/ML IV SOLN
10.0000 mg | Freq: Once | INTRAVENOUS | Status: AC
  Administered 2021-01-27: 14:00:00 10 mg via INTRAVENOUS
  Filled 2021-01-27: qty 1

## 2021-01-27 MED ORDER — SODIUM CHLORIDE 0.9 % IV SOLN
45.0000 mg/m2 | Freq: Once | INTRAVENOUS | Status: AC
  Administered 2021-01-27: 15:00:00 66 mg via INTRAVENOUS
  Filled 2021-01-27: qty 11

## 2021-01-27 MED ORDER — PALONOSETRON HCL INJECTION 0.25 MG/5ML
0.2500 mg | Freq: Once | INTRAVENOUS | Status: AC
  Administered 2021-01-27: 13:00:00 0.25 mg via INTRAVENOUS
  Filled 2021-01-27: qty 5

## 2021-01-27 MED ORDER — SODIUM CHLORIDE 0.9 % IV SOLN: Freq: Once | INTRAVENOUS | Status: AC

## 2021-01-27 MED ORDER — FAMOTIDINE 20 MG IN NS 100 ML IVPB
20.0000 mg | Freq: Once | INTRAVENOUS | Status: AC
  Administered 2021-01-27: 14:00:00 20 mg via INTRAVENOUS
  Filled 2021-01-27: qty 100

## 2021-01-27 MED ORDER — SODIUM CHLORIDE 0.9 % IV SOLN
10.0000 mg | Freq: Once | INTRAVENOUS | Status: AC
  Administered 2021-01-27: 14:00:00 10 mg via INTRAVENOUS
  Filled 2021-01-27: qty 10

## 2021-01-27 MED ORDER — SODIUM CHLORIDE 0.9 % IV SOLN
162.2000 mg | Freq: Once | INTRAVENOUS | Status: AC
  Administered 2021-01-27: 16:00:00 160 mg via INTRAVENOUS
  Filled 2021-01-27: qty 16

## 2021-01-27 NOTE — Patient Instructions (Signed)
[facility] ONCOLOGY  Discharge Instructions: Thank you for choosing [facility] to provide your oncology and hematology care.   If you have a lab appointment with the [facility], please go directly to the [facility] and check in at the registration area.   Wear comfortable clothing and clothing appropriate for easy access to any Portacath or PICC line.   We strive to give you quality time with your provider. You may need to reschedule your appointment if you arrive late (15 or more minutes).  Arriving late affects you and other patients whose appointments are after yours.  Also, if you miss three or more appointments without notifying the office, you may be dismissed from the clinic at the providers discretion.      For prescription refill requests, have your pharmacy contact our office and allow 72 hours for refills to be completed.    Today you received the following chemotherapy and/or immunotherapy agents Taxol, Carboplatin      To help prevent nausea and vomiting after your treatment, we encourage you to take your nausea medication as directed.  BELOW ARE SYMPTOMS THAT SHOULD BE REPORTED IMMEDIATELY: *FEVER GREATER THAN 100.4 F (38 C) OR HIGHER *CHILLS OR SWEATING *NAUSEA AND VOMITING THAT IS NOT CONTROLLED WITH YOUR NAUSEA MEDICATION *UNUSUAL SHORTNESS OF BREATH *UNUSUAL BRUISING OR BLEEDING *URINARY PROBLEMS (pain or burning when urinating, or frequent urination) *BOWEL PROBLEMS (unusual diarrhea, constipation, pain near the anus) TENDERNESS IN MOUTH AND THROAT WITH OR WITHOUT PRESENCE OF ULCERS (sore throat, sores in mouth, or a toothache) UNUSUAL RASH, SWELLING OR PAIN  UNUSUAL VAGINAL DISCHARGE OR ITCHING   Items with * indicate a potential emergency and should be followed up as soon as possible or go to the Emergency Department if any problems should occur.  Please show the CHEMOTHERAPY ALERT CARD or IMMUNOTHERAPY ALERT CARD at  check-in to the Emergency Department and triage nurse.  Should you have questions after your visit or need to cancel or reschedule your appointment, please contact [facility]  Dept: [phone]  and follow the prompts.  Office hours are 8:00 a.m. to 4:30 p.m. Monday - Friday. Please note that voicemails left after 4:00 p.m. may not be returned until the following business day.  We are closed weekends and major holidays. You have access to a nurse at all times for urgent questions. Please call the main number to the clinic Dept: [phone] and follow the prompts.   For any non-urgent questions, you may also contact your provider using MyChart. We now offer e-Visits for anyone 29 and older to request care online for non-urgent symptoms. For details visit [email or web address].   Also download the MyChart app! Go to the app store, search "MyChart", open the app, select [facility], and log in with your MyChart username and password.  Due to Covid, a mask is required upon entering the hospital/clinic. If you do not have a mask, one will be given to you upon arrival. For doctor visits, patients may have 1 support person aged 52 or older with them. For treatment visits, patients cannot have anyone with them due to current Covid guidelines and our immunocompromised population.

## 2021-01-27 NOTE — Progress Notes (Signed)
[facility] OFFICE PROGRESS NOTE  [name], NP [place] [address]  DIAGNOSIS: Stage IIIB  (T1b, N3, M0) non-small cell lung cancer, adenocarcinoma she presented with right upper lobe nodule in addition to bulky right hilar, mediastinal, and left supraclavicular lymphadenopathy  Molecular Studies: pending  PRIOR THERAPY: None  CURRENT THERAPY: Concurrent chemoradiation with carboplatin for an AUC of 2 and paclitaxel 45 mg per metered squared.  First dose on 01/05/2021.  Status post 3 cycles of treatment  INTERVAL HISTORY: Julie Nicholson 55 y.o. female returns to clinic today for follow-up visit.  The patient is feeling fairly well today without any concerning complaints.  She is tolerating her chemotherapy portion of her treatment well without any concerning adverse side effects.  She does report some odynophagia with swallowing.  Carafate causes nausea and vomiting.  She has been using lidocaine which does help but only last 10 minutes or so.  Dr. Sondra Come reportedly told her if needed she can take 2 of her 5 mg oxycodones for better pain control.   Otherwise she reports she has a mild sinus infection but started 2 days ago.  She states that her granddaughter who she cares for has a mild cold.  Patient reports some nasal congestion and has been using Flonase.  She states she is prone to getting sinus infections.  She denies any associated fevers, sore throat besides the esophagitis from radiation.  She denies any changes with unusual coughs or shortness of breath.  The patient quit smoking in November 2022.  Her weight is stable.  She denies any chills.  She denies any more nausea, vomiting, or diarrhea.  She reports she has hard stools.  Her last bowel movement was this morning it was hard.  Denies any dysuria.  She has a few small skin lesions on her lower extremity which popped up recently.  She denies any headache or visual changes.  She is here  today for evaluation and repeat blood work before starting her next cycle of treatment. MEDICAL HISTORY: Past Medical History:  Diagnosis Date   Abdominal discomfort    Chronic headaches    due to allergies, sinus   COPD (chronic obstructive pulmonary disease) ([place])    per 2012 chest xray   pt states she doesn not have this now (04/10/2013)   Deviated nasal septum    Eustachian tube dysfunction    GERD (gastroesophageal reflux disease)    occasional uses Tums / Rolaids   Hearing loss    right ear   High cholesterol    Migraine    "only once in a blue moon since RX'd allergy shots" (04/10/2013)   Pancreatitis 02/08/2013   Pneumonia    Rhinitis, allergic     ALLERGIES:  is allergic to benadryl [diphenhydramine].  MEDICATIONS:  Current Outpatient Medications  Medication Sig Dispense Refill   acetaminophen (TYLENOL) 500 MG tablet Take 500 mg by mouth as needed.     albuterol (VENTOLIN HFA) 108 (90 Base) MCG/ACT inhaler Inhale 1-2 puffs into the lungs every 6 (six) hours as needed for wheezing or shortness of breath. 6.7 g 0   aspirin EC 81 MG tablet Take 81 mg by mouth at bedtime.     atorvastatin (LIPITOR) 40 MG tablet Take 40 mg by mouth at bedtime.     Biotin 5000 MCG TABS Take 5,000 mcg by mouth at bedtime.     buPROPion (WELLBUTRIN SR) 150 MG 12 hr tablet Take 1 tablet (  150 mg total) by mouth 2 (two) times daily. 60 tablet 5   Ca Carbonate-Mag Hydroxide (ROLAIDS PO) Take 1 tablet by mouth daily.     famotidine (PEPCID) 20 MG tablet Take 1 tablet (20 mg total) by mouth 2 (two) times daily. 60 tablet 2   fluticasone (FLONASE) 50 MCG/ACT nasal spray Place 2 sprays into both nostrils daily as needed for allergies.     ibuprofen (ADVIL) 200 MG tablet Take 200 mg by mouth as needed.     lidocaine (XYLOCAINE) 2 % solution Patient: Mix 1part 2% viscous lidocaine, 1part H20. Swallow 60mL of diluted mixture, 40min before meals and at bedtime, up to QID PRN soreness. 150 mL 0   Multiple  Vitamins-Minerals (HAIR SKIN AND NAILS FORMULA PO) Take 1 tablet by mouth daily.     oxycodone (OXY-IR) 5 MG capsule Take 5 mg by mouth 3 (three) times daily as needed for pain.     prochlorperazine (COMPAZINE) 10 MG tablet Take 1 tablet (10 mg total) by mouth every 6 (six) hours as needed. 30 tablet 2   sucralfate (CARAFATE) 1 g tablet Dissolve 1 tablet in 10 mL H20 and swallow 30 min prior to meals and bedtime. 90 tablet 0   UBRELVY 100 MG TABS Take 100 mg by mouth daily as needed (migraine).     No current facility-administered medications for this visit.   Facility-Administered Medications Ordered in Other Visits  Medication Dose Route Frequency Provider Last Rate Last Admin   0.9 %  sodium chloride infusion   Intravenous Once Curt Bears, MD       CARBOplatin (PARAPLATIN) 160 mg in sodium chloride 0.9 % 250 mL chemo infusion  160 mg Intravenous Once Curt Bears, MD       cetirizine Ambrose Mantle) injection 10 mg  10 mg Intravenous Once Dary Dilauro L, PA-C       dexamethasone (DECADRON) 10 mg in sodium chloride 0.9 % 50 mL IVPB  10 mg Intravenous Once Curt Bears, MD       famotidine (PEPCID) IVPB 20 mg in NS 100 mL IVPB  20 mg Intravenous Once Curt Bears, MD       PACLitaxel (TAXOL) 66 mg in sodium chloride 0.9 % 150 mL chemo infusion (</= 80mg /m2)  45 mg/m2 (Treatment Plan Recorded) Intravenous Once Curt Bears, MD       palonosetron (ALOXI) injection 0.25 mg  0.25 mg Intravenous Once Curt Bears, MD        SURGICAL HISTORY:  Past Surgical History:  Procedure Laterality Date   ABDOMINAL HYSTERECTOMY  1995   tx endometriosis, both ovaries removed   APPENDECTOMY  late 1990's   BRONCHIAL BRUSHINGS  12/15/2020   Procedure: BRONCHIAL BRUSHINGS;  Surgeon: Collene Gobble, MD;  Location: [facility];  Service: Cardiopulmonary;;   BRONCHIAL NEEDLE ASPIRATION BIOPSY  12/15/2020   Procedure: BRONCHIAL NEEDLE ASPIRATION BIOPSIES;  Surgeon: Collene Gobble, MD;  Location: [facility];  Service: Cardiopulmonary;;   CHOLECYSTECTOMY  04/10/2013   CHOLECYSTECTOMY N/A 04/10/2013   Procedure: LAPAROSCOPIC CHOLECYSTECTOMY WITH INTRAOPERATIVE CHOLANGIOGRAM;  Surgeon: Odis Hollingshead, MD;  Location: [facility];  Service: General;  Laterality: N/A;   ELECTROMAGNETIC NAVIGATION BROCHOSCOPY  12/15/2020   Procedure: ELECTROMAGNETIC NAVIGATION BRONCHOSCOPY;  Surgeon: Collene Gobble, MD;  Location: [facility] ENDOSCOPY;  Service: Cardiopulmonary;;   EUS N/A 02/16/2013   Procedure: UPPER ENDOSCOPIC ULTRASOUND (EUS) LINEAR;  Surgeon: Milus Banister, MD;  Location: [facility] ENDOSCOPY;  Service: Endoscopy;  Laterality: N/A;  radial linear  KNEE ARTHROSCOPY Right 1980's   "cartilage OR"   LAPAROSCOPIC ENDOMETRIOSIS FULGURATION  1980's   MYRINGOTOMY WITH TUBE PLACEMENT Right 07/13/2018   Procedure: MYRINGOTOMY WITH TUBE PLACEMENT;  Surgeon: Margaretha Sheffield, MD;  Location: [facility];  Service: ENT;  Laterality: Right;   NASOPHARYNGOSCOPY EUSTATION TUBE BALLOON DILATION Right 07/13/2018   Procedure: NASOPHARYNGOSCOPY EUSTATION TUBE BALLOON DILATION;  Surgeon: Margaretha Sheffield, MD;  Location: [facility];  Service: ENT;  Laterality: Right;   TONSILLECTOMY AND ADENOIDECTOMY  ~ 1980   adenoidectomy   TUBAL LIGATION  ~ [place] Right 07/13/2018   Procedure: OUTFRACTURE TURBINATE;  Surgeon: Margaretha Sheffield, MD;  Location: [facility];  Service: ENT;  Laterality: Right;   VIDEO BRONCHOSCOPY WITH ENDOBRONCHIAL ULTRASOUND N/A 12/15/2020   Procedure: ROBOTIC VIDEO BRONCHOSCOPY WITH ENDOBRONCHIAL ULTRASOUND;  Surgeon: Collene Gobble, MD;  Location: [facility];  Service: Cardiopulmonary;  Laterality: N/A;   WRIST SURGERY Left    w/plate    REVIEW OF SYSTEMS:   Review of Systems  Constitutional: Negative for appetite change, chills, fatigue, fever and unexpected weight change.  HENT: Positive for sore throat with swallowing.  Positive for nasal  congestion.  Negative for mouth sores, nosebleeds, sore throat and trouble swallowing.   Eyes: Negative for eye problems and icterus.  Respiratory: Positive for chronic dry cough (improved). Negative for hemoptysis, shortness of breath and wheezing.   Cardiovascular: Negative for chest pain and leg swelling.  Gastrointestinal: Positive for hard stools.  Negative for abdominal pain, constipation, diarrhea, nausea and vomiting.  Genitourinary: Negative for bladder incontinence, difficulty urinating, dysuria, frequency and hematuria.   Musculoskeletal: Negative for back pain, gait problem, neck pain and neck stiffness.  Skin: Negative for itching.  Few small scattered lesions on right lower extremity. Neurological: Negative for dizziness, extremity weakness, gait problem, headaches, light-headedness and seizures.  Hematological: Negative for adenopathy. Does not bruise/bleed easily.  Psychiatric/Behavioral: Negative for confusion, depression and sleep disturbance. The patient is not nervous/anxious.    PHYSICAL EXAMINATION:  Blood pressure (!) 118/54, pulse 88, temperature 97.8 F (36.6 C), temperature source Tympanic, resp. rate 18, height 5\' 1"  (1.549 m), weight 104 lb 3.2 oz (47.3 kg), SpO2 99 %.  ECOG PERFORMANCE STATUS: 1  Physical Exam  Constitutional: Oriented to person, place, and time and thin appearing female and in no distress.  HENT:  Head: Normocephalic and atraumatic.  Mouth/Throat: Oropharynx is clear and moist. No oropharyngeal exudate.  Eyes: Conjunctivae are normal. Right eye exhibits no discharge. Left eye exhibits no discharge. No scleral icterus.  Neck: Normal range of motion. Neck supple.  Cardiovascular: Normal rate, regular rhythm, normal heart sounds and intact distal pulses.   Pulmonary/Chest: Effort normal and breath sounds normal. No respiratory distress. No wheezes. No rales.  Abdominal: Soft. Bowel sounds are normal. Exhibits no distension and no mass. There is  no tenderness.  Musculoskeletal: Normal range of motion. Exhibits no edema.  Lymphadenopathy:    No cervical adenopathy.  Neurological: Alert and oriented to person, place, and time. Exhibits normal muscle tone. Gait normal. Coordination normal.  Skin: Skin is warm and dry. No rash noted. Not diaphoretic. No erythema. No pallor.  Psychiatric: Mood, memory and judgment normal.  Vitals reviewed.  LABORATORY DATA: Lab Results  Component Value Date   WBC 2.2 (L) 01/27/2021   HGB 11.4 (L) 01/27/2021   HCT 34.1 (L) 01/27/2021   MCV 97.4 01/27/2021   PLT 190 01/27/2021      Chemistry  Component Value Date/Time   NA 140 01/27/2021 1151   K 3.7 01/27/2021 1151   CL 106 01/27/2021 1151   CO2 28 01/27/2021 1151   BUN 11 01/27/2021 1151   CREATININE 0.68 01/27/2021 1151      Component Value Date/Time   CALCIUM 9.5 01/27/2021 1151   ALKPHOS 99 01/27/2021 1151   AST 19 01/27/2021 1151   ALT 30 01/27/2021 1151   BILITOT 0.5 01/27/2021 1151       RADIOGRAPHIC STUDIES:  No results found.   ASSESSMENT/PLAN:  This is a very pleasant 55 year old Caucasian female with stage IIIB (T1b, N3, M0) non-small cell lung cancer, adenocarcinoma.  She presented with a right upper lobe spiculated nodule and right hilar, mediastinal, and left supraclavicular adenopathy.  She was diagnosed in November 2022.  There is insufficient tissue for molecular studies.  Therefore, the patient had guardant 360 performed which showed no actionable mutations.    The patient is currently undergoing concurrent chemoradiation with carboplatin for an AUC of 2 and paclitaxel 45 mg per metered squared.  The patient is status post 3 cycles and tolerated it well.  Her last day radiation is tentatively scheduled for 02/19/2021.   Labs were reviewed.  Her total white blood cell count is 2.2.  Her ANC is 1.2.  Reviewed neutropenic precautions with the patient. Advised to call she develops any fever, cough, abdominal pain,  diarrhea, skin infections, or dysuria.  Reviewed her labs with Dr. Julien Nordmann.  Dr. Julien Nordmann recommended that she proceed with cycle #4 today scheduled.  We will see her back for follow-up visit in 3 weeks for evaluation before undergoing cycle #4.   Congratulated her on smoking cessation.   She will continue to follow with nutrition.  She is scheduled to see them 02/09/2021   She reports drowsiness with the 50 mg of benadryl. Pharmacy has adjusted to IV cetrizine she is tolerating better.  The patient will continue using Flonase for her sinus infection.  She knows to call if she develops any systemic symptoms or if her symptoms fail to improve as anticipated.  The patient was advised to call immediately if she has any concerning symptoms in the interval. The patient voices understanding of current disease status and treatment options and is in agreement with the current care plan. All questions were answered. The patient knows to call the clinic with any problems, questions or concerns. We can certainly see the patient much sooner if necessary     No orders of the defined types were placed in this encounter.    The total time spent in the appointment was 20-29 minutes.   Tameaka Eichhorn L Vitalia Stough, PA-C 01/27/21

## 2021-01-27 NOTE — Progress Notes (Signed)
Ok to treat today with an ANC of 1.2 per [employer], PA

## 2021-01-28 ENCOUNTER — Ambulatory Visit
Admission: RE | Admit: 2021-01-28 | Discharge: 2021-01-28 | Disposition: A | Discharge status: Home / Self Care | Payer: Commercial Managed Care - PPO | Source: Ambulatory Visit | Attending: Radiation Oncology | Admitting: Radiation Oncology

## 2021-01-28 DIAGNOSIS — Z510 Encounter for antineoplastic radiation therapy: Secondary | ICD-10-CM | POA: Diagnosis not present

## 2021-01-29 ENCOUNTER — Other Ambulatory Visit: Payer: Self-pay

## 2021-01-29 ENCOUNTER — Ambulatory Visit
Admission: RE | Admit: 2021-01-29 | Discharge: 2021-01-29 | Disposition: A | Discharge status: Home / Self Care | Payer: Commercial Managed Care - PPO | Source: Ambulatory Visit | Attending: Radiation Oncology | Admitting: Radiation Oncology

## 2021-01-29 DIAGNOSIS — Z510 Encounter for antineoplastic radiation therapy: Secondary | ICD-10-CM | POA: Diagnosis not present

## 2021-01-30 ENCOUNTER — Ambulatory Visit
Admission: RE | Admit: 2021-01-30 | Discharge: 2021-01-30 | Disposition: A | Discharge status: Home / Self Care | Payer: Commercial Managed Care - PPO | Source: Ambulatory Visit | Attending: Radiation Oncology | Admitting: Radiation Oncology

## 2021-01-30 DIAGNOSIS — Z510 Encounter for antineoplastic radiation therapy: Secondary | ICD-10-CM | POA: Diagnosis not present

## 2021-02-03 ENCOUNTER — Other Ambulatory Visit: Payer: Commercial Managed Care - PPO

## 2021-02-03 ENCOUNTER — Other Ambulatory Visit: Payer: Self-pay

## 2021-02-03 ENCOUNTER — Ambulatory Visit
Admission: RE | Admit: 2021-02-03 | Discharge: 2021-02-03 | Disposition: A | Discharge status: Home / Self Care | Payer: Commercial Managed Care - PPO | Source: Ambulatory Visit | Attending: Radiation Oncology | Admitting: Radiation Oncology

## 2021-02-03 ENCOUNTER — Inpatient Hospital Stay: Payer: Commercial Managed Care - PPO

## 2021-02-03 ENCOUNTER — Ambulatory Visit: Payer: Commercial Managed Care - PPO

## 2021-02-03 ENCOUNTER — Ambulatory Visit: Payer: Commercial Managed Care - PPO | Admitting: Adult Health

## 2021-02-03 ENCOUNTER — Inpatient Hospital Stay: Payer: Commercial Managed Care - PPO | Attending: Internal Medicine

## 2021-02-03 VITALS — BP 117/81 | HR 110 | Temp 97.9°F | Resp 20 | Ht 61.0 in | Wt 104.2 lb

## 2021-02-03 DIAGNOSIS — C3411 Malignant neoplasm of upper lobe, right bronchus or lung: Secondary | ICD-10-CM | POA: Insufficient documentation

## 2021-02-03 DIAGNOSIS — Z5111 Encounter for antineoplastic chemotherapy: Secondary | ICD-10-CM | POA: Diagnosis present

## 2021-02-03 LAB — CBC WITH DIFFERENTIAL (CANCER CENTER ONLY)
Abs Immature Granulocytes: 0.01 10*3/uL (ref 0.00–0.07)
Basophils Absolute: 0 10*3/uL (ref 0.0–0.1)
Basophils Relative: 0 %
Eosinophils Absolute: 0 10*3/uL (ref 0.0–0.5)
Eosinophils Relative: 2 %
HCT: 36.6 % (ref 36.0–46.0)
Hemoglobin: 13.1 g/dL (ref 12.0–15.0)
Immature Granulocytes: 0 %
Lymphocytes Relative: 12 %
Lymphs Abs: 0.3 10*3/uL — ABNORMAL LOW (ref 0.7–4.0)
MCH: 33.6 pg (ref 26.0–34.0)
MCHC: 35.8 g/dL (ref 30.0–36.0)
MCV: 93.8 fL (ref 80.0–100.0)
Monocytes Absolute: 0.1 10*3/uL (ref 0.1–1.0)
Monocytes Relative: 5 %
Neutro Abs: 2.1 10*3/uL (ref 1.7–7.7)
Neutrophils Relative %: 81 %
Platelet Count: 134 10*3/uL — ABNORMAL LOW (ref 150–400)
RBC: 3.9 MIL/uL (ref 3.87–5.11)
RDW: 14.2 % (ref 11.5–15.5)
WBC Count: 2.6 10*3/uL — ABNORMAL LOW (ref 4.0–10.5)
nRBC: 0 % (ref 0.0–0.2)

## 2021-02-03 LAB — CMP (CANCER CENTER ONLY)
ALT: 62 U/L — ABNORMAL HIGH (ref 0–44)
AST: 53 U/L — ABNORMAL HIGH (ref 15–41)
Albumin: 4.2 g/dL (ref 3.5–5.0)
Alkaline Phosphatase: 108 U/L (ref 38–126)
Anion gap: 8 (ref 5–15)
BUN: 13 mg/dL (ref 6–20)
CO2: 26 mmol/L (ref 22–32)
Calcium: 9.7 mg/dL (ref 8.9–10.3)
Chloride: 105 mmol/L (ref 98–111)
Creatinine: 0.66 mg/dL (ref 0.44–1.00)
GFR, Estimated: >60 mL/min (ref >60)
Glucose, Bld: 95 mg/dL (ref 70–99)
Potassium: 4.2 mmol/L (ref 3.5–5.1)
Sodium: 139 mmol/L (ref 135–145)
Total Bilirubin: 0.8 mg/dL (ref 0.3–1.2)
Total Protein: 7.4 g/dL (ref 6.5–8.1)

## 2021-02-03 MED ORDER — PALONOSETRON HCL INJECTION 0.25 MG/5ML
0.2500 mg | Freq: Once | INTRAVENOUS | Status: AC
  Administered 2021-02-03: 0.25 mg via INTRAVENOUS
  Filled 2021-02-03: qty 5

## 2021-02-03 MED ORDER — SODIUM CHLORIDE 0.9 % IV SOLN
162.2000 mg | Freq: Once | INTRAVENOUS | Status: AC
  Administered 2021-02-03: 160 mg via INTRAVENOUS
  Filled 2021-02-03: qty 16

## 2021-02-03 MED ORDER — SODIUM CHLORIDE 0.9 % IV SOLN
45.0000 mg/m2 | Freq: Once | INTRAVENOUS | Status: AC
  Administered 2021-02-03: 66 mg via INTRAVENOUS
  Filled 2021-02-03: qty 11

## 2021-02-03 MED ORDER — FAMOTIDINE 20 MG IN NS 100 ML IVPB
20.0000 mg | Freq: Once | INTRAVENOUS | Status: AC
  Administered 2021-02-03: 20 mg via INTRAVENOUS
  Filled 2021-02-03: qty 100

## 2021-02-03 MED ORDER — SODIUM CHLORIDE 0.9 % IV SOLN
10.0000 mg | Freq: Once | INTRAVENOUS | Status: AC
  Administered 2021-02-03: 10 mg via INTRAVENOUS
  Filled 2021-02-03: qty 10

## 2021-02-03 MED ORDER — CETIRIZINE HCL 10 MG/ML IV SOLN
10.0000 mg | Freq: Once | INTRAVENOUS | Status: AC
  Administered 2021-02-03: 10 mg via INTRAVENOUS
  Filled 2021-02-03: qty 1

## 2021-02-03 MED ORDER — SODIUM CHLORIDE 0.9 % IV SOLN: Freq: Once | INTRAVENOUS | Status: AC

## 2021-02-03 NOTE — Patient Instructions (Signed)
[facility] ONCOLOGY  Discharge Instructions: Thank you for choosing [facility] to provide your oncology and hematology care.   If you have a lab appointment with the Cancer Center, please go directly to the Cancer Center and check in at the registration area.   Wear comfortable clothing and clothing appropriate for easy access to any Portacath or PICC line.   We strive to give you quality time with your provider. You may need to reschedule your appointment if you arrive late (15 or more minutes).  Arriving late affects you and other patients whose appointments are after yours.  Also, if you miss three or more appointments without notifying the office, you may be dismissed from the clinic at the provider's discretion.      For prescription refill requests, have your pharmacy contact our office and allow 72 hours for refills to be completed.    Today you received the following chemotherapy and/or immunotherapy agents: Paclitaxel (Taxol) and Carboplatin.   To help prevent nausea and vomiting after your treatment, we encourage you to take your nausea medication as directed.  BELOW ARE SYMPTOMS THAT SHOULD BE REPORTED IMMEDIATELY: *FEVER GREATER THAN 100.4 F (38 C) OR HIGHER *CHILLS OR SWEATING *NAUSEA AND VOMITING THAT IS NOT CONTROLLED WITH YOUR NAUSEA MEDICATION *UNUSUAL SHORTNESS OF BREATH *UNUSUAL BRUISING OR BLEEDING *URINARY PROBLEMS (pain or burning when urinating, or frequent urination) *BOWEL PROBLEMS (unusual diarrhea, constipation, pain near the anus) TENDERNESS IN MOUTH AND THROAT WITH OR WITHOUT PRESENCE OF ULCERS (sore throat, sores in mouth, or a toothache) UNUSUAL RASH, SWELLING OR PAIN  UNUSUAL VAGINAL DISCHARGE OR ITCHING   Items with * indicate a potential emergency and should be followed up as soon as possible or go to the Emergency Department if any problems should occur.  Please show the CHEMOTHERAPY ALERT CARD or IMMUNOTHERAPY  ALERT CARD at check-in to the Emergency Department and triage nurse.  Should you have questions after your visit or need to cancel or reschedule your appointment, please contact [facility]  Dept: [phone]  and follow the prompts.  Office hours are 8:00 a.m. to 4:30 p.m. Monday - Friday. Please note that voicemails left after 4:00 p.m. may not be returned until the following business day.  We are closed weekends and major holidays. You have access to a nurse at all times for urgent questions. Please call the main number to the clinic Dept: [phone] and follow the prompts.   For any non-urgent questions, you may also contact your provider using MyChart. We now offer e-Visits for anyone 18 and older to request care online for non-urgent symptoms. For details visit [email or web address].   Also download the MyChart app! Go to the app store, search "MyChart", open the app, select [facility], and log in with your MyChart username and password.  Due to Covid, a mask is required upon entering the hospital/clinic. If you do not have a mask, one will be given to you upon arrival. For doctor visits, patients may have 1 support person aged 18 or older with them. For treatment visits, patients cannot have anyone with them due to current Covid guidelines and our immunocompromised population.   

## 2021-02-04 ENCOUNTER — Ambulatory Visit
Admission: RE | Admit: 2021-02-04 | Discharge: 2021-02-04 | Disposition: A | Discharge status: Home / Self Care | Payer: Commercial Managed Care - PPO | Source: Ambulatory Visit | Attending: Radiation Oncology | Admitting: Radiation Oncology

## 2021-02-04 ENCOUNTER — Telehealth: Payer: Self-pay | Admitting: Medical Oncology

## 2021-02-04 DIAGNOSIS — Z5111 Encounter for antineoplastic chemotherapy: Secondary | ICD-10-CM | POA: Diagnosis not present

## 2021-02-04 NOTE — Telephone Encounter (Signed)
2nd opinion completed at [facility] ( it is a pt benefit) .  Asking if [facility] received their "second opinion review and does Dr Julien Nordmann want a collaborative clinical call "?  LVM for [facility] to return my call.

## 2021-02-05 ENCOUNTER — Telehealth: Payer: Self-pay | Admitting: *Deleted

## 2021-02-05 ENCOUNTER — Other Ambulatory Visit: Payer: Self-pay

## 2021-02-05 ENCOUNTER — Ambulatory Visit
Admission: RE | Admit: 2021-02-05 | Discharge: 2021-02-05 | Disposition: A | Discharge status: Home / Self Care | Payer: Commercial Managed Care - PPO | Source: Ambulatory Visit | Attending: Radiation Oncology | Admitting: Radiation Oncology

## 2021-02-05 DIAGNOSIS — Z5111 Encounter for antineoplastic chemotherapy: Secondary | ICD-10-CM | POA: Diagnosis not present

## 2021-02-05 NOTE — Telephone Encounter (Signed)
Connected with [facility] [phone]) who informed registration she needs to speak with someone as has not yet heard or received anything from long term disability".     Noted (SW() H.I.M. received document 01/13/2021 which is addressed to individual provider.  Advised she connect with [facility] t advise resubmitting request to [facility].  Received new request.

## 2021-02-06 ENCOUNTER — Encounter (HOSPITAL_COMMUNITY): Payer: Self-pay

## 2021-02-06 ENCOUNTER — Ambulatory Visit
Admission: RE | Admit: 2021-02-06 | Discharge: 2021-02-06 | Disposition: A | Discharge status: Home / Self Care | Payer: Commercial Managed Care - PPO | Source: Ambulatory Visit | Attending: Radiation Oncology | Admitting: Radiation Oncology

## 2021-02-06 DIAGNOSIS — Z5111 Encounter for antineoplastic chemotherapy: Secondary | ICD-10-CM | POA: Diagnosis not present

## 2021-02-06 MED FILL — Dexamethasone Sodium Phosphate Inj 100 MG/10ML: INTRAMUSCULAR | Qty: 1 | Status: AC

## 2021-02-09 ENCOUNTER — Inpatient Hospital Stay: Payer: Commercial Managed Care - PPO

## 2021-02-09 ENCOUNTER — Other Ambulatory Visit: Payer: Self-pay

## 2021-02-09 ENCOUNTER — Ambulatory Visit
Admission: RE | Admit: 2021-02-09 | Discharge: 2021-02-09 | Disposition: A | Discharge status: Home / Self Care | Payer: Commercial Managed Care - PPO | Source: Ambulatory Visit | Attending: Radiation Oncology | Admitting: Radiation Oncology

## 2021-02-09 ENCOUNTER — Inpatient Hospital Stay (HOSPITAL_BASED_OUTPATIENT_CLINIC_OR_DEPARTMENT_OTHER): Payer: Commercial Managed Care - PPO | Admitting: Internal Medicine

## 2021-02-09 ENCOUNTER — Encounter: Payer: Self-pay | Admitting: Internal Medicine

## 2021-02-09 VITALS — BP 112/68 | HR 82 | Temp 98.2°F | Resp 17 | Ht 61.0 in | Wt 104.9 lb

## 2021-02-09 DIAGNOSIS — C3411 Malignant neoplasm of upper lobe, right bronchus or lung: Secondary | ICD-10-CM

## 2021-02-09 DIAGNOSIS — Z5111 Encounter for antineoplastic chemotherapy: Secondary | ICD-10-CM | POA: Diagnosis not present

## 2021-02-09 DIAGNOSIS — C3490 Malignant neoplasm of unspecified part of unspecified bronchus or lung: Secondary | ICD-10-CM

## 2021-02-09 LAB — CMP (CANCER CENTER ONLY)
ALT: 46 U/L — ABNORMAL HIGH (ref 0–44)
AST: 21 U/L (ref 15–41)
Albumin: 4.1 g/dL (ref 3.5–5.0)
Alkaline Phosphatase: 106 U/L (ref 38–126)
Anion gap: 9 (ref 5–15)
BUN: 25 mg/dL — ABNORMAL HIGH (ref 6–20)
CO2: 24 mmol/L (ref 22–32)
Calcium: 10.2 mg/dL (ref 8.9–10.3)
Chloride: 105 mmol/L (ref 98–111)
Creatinine: 0.76 mg/dL (ref 0.44–1.00)
GFR, Estimated: >60 mL/min (ref >60)
Glucose, Bld: 108 mg/dL — ABNORMAL HIGH (ref 70–99)
Potassium: 4.2 mmol/L (ref 3.5–5.1)
Sodium: 138 mmol/L (ref 135–145)
Total Bilirubin: 0.9 mg/dL (ref 0.3–1.2)
Total Protein: 7.1 g/dL (ref 6.5–8.1)

## 2021-02-09 LAB — CBC WITH DIFFERENTIAL (CANCER CENTER ONLY)
Abs Immature Granulocytes: 0.01 10*3/uL (ref 0.00–0.07)
Basophils Absolute: 0 10*3/uL (ref 0.0–0.1)
Basophils Relative: 1 %
Eosinophils Absolute: 0.1 10*3/uL (ref 0.0–0.5)
Eosinophils Relative: 3 %
HCT: 34.1 % — ABNORMAL LOW (ref 36.0–46.0)
Hemoglobin: 12 g/dL (ref 12.0–15.0)
Immature Granulocytes: 1 %
Lymphocytes Relative: 14 %
Lymphs Abs: 0.3 10*3/uL — ABNORMAL LOW (ref 0.7–4.0)
MCH: 33.3 pg (ref 26.0–34.0)
MCHC: 35.2 g/dL (ref 30.0–36.0)
MCV: 94.7 fL (ref 80.0–100.0)
Monocytes Absolute: 0.2 10*3/uL (ref 0.1–1.0)
Monocytes Relative: 7 %
Neutro Abs: 1.5 10*3/uL — ABNORMAL LOW (ref 1.7–7.7)
Neutrophils Relative %: 74 %
Platelet Count: 116 10*3/uL — ABNORMAL LOW (ref 150–400)
RBC: 3.6 MIL/uL — ABNORMAL LOW (ref 3.87–5.11)
RDW: 14.3 % (ref 11.5–15.5)
WBC Count: 2 10*3/uL — ABNORMAL LOW (ref 4.0–10.5)
nRBC: 0 % (ref 0.0–0.2)

## 2021-02-09 MED ORDER — PALONOSETRON HCL INJECTION 0.25 MG/5ML
0.2500 mg | Freq: Once | INTRAVENOUS | Status: AC
  Administered 2021-02-09: 0.25 mg via INTRAVENOUS
  Filled 2021-02-09: qty 5

## 2021-02-09 MED ORDER — CETIRIZINE HCL 10 MG/ML IV SOLN
10.0000 mg | Freq: Once | INTRAVENOUS | Status: AC
  Administered 2021-02-09: 10 mg via INTRAVENOUS
  Filled 2021-02-09: qty 1

## 2021-02-09 MED ORDER — SODIUM CHLORIDE 0.9 % IV SOLN: Freq: Once | INTRAVENOUS | Status: AC

## 2021-02-09 MED ORDER — SODIUM CHLORIDE 0.9 % IV SOLN
162.2000 mg | Freq: Once | INTRAVENOUS | Status: AC
  Administered 2021-02-09: 160 mg via INTRAVENOUS
  Filled 2021-02-09: qty 16

## 2021-02-09 MED ORDER — SODIUM CHLORIDE 0.9 % IV SOLN
10.0000 mg | Freq: Once | INTRAVENOUS | Status: AC
  Administered 2021-02-09: 10 mg via INTRAVENOUS
  Filled 2021-02-09: qty 10

## 2021-02-09 MED ORDER — FAMOTIDINE 20 MG IN NS 100 ML IVPB
20.0000 mg | Freq: Once | INTRAVENOUS | Status: AC
  Administered 2021-02-09: 20 mg via INTRAVENOUS
  Filled 2021-02-09: qty 100

## 2021-02-09 MED ORDER — SODIUM CHLORIDE 0.9 % IV SOLN
45.0000 mg/m2 | Freq: Once | INTRAVENOUS | Status: AC
  Administered 2021-02-09: 66 mg via INTRAVENOUS
  Filled 2021-02-09: qty 11

## 2021-02-09 NOTE — Progress Notes (Signed)
Nutrition Follow-up:   Patient with non small cell lung cancer.  Patient on concurrent chemotherapy and radiation.    Met with patient during infusion.  Patient reports that she is eating 3 meals per day and drinking ensure complete at night.  If she does not eat well during the day will drink 2 ensure completes.  Reports issues with dysphagia/reflux that comes and goes from radiation.      Medications: reviewed  Labs: reviewed  Anthropometrics:   Weight 104 lb 14.4 oz today, stable  104 lb 4 oz on 12/19  100 lb on 11/30  NUTRITION DIAGNOSIS: Unintentional weight loss stable   INTERVENTION:  Reviewed ways to add calories in diet for weight maintenance/gain. Continue ensure complete (350 calories and 30 g protein)    MONITORING, EVALUATION, GOAL: weight trends, intake   NEXT VISIT: as needed  Julie Nicholson, [place], [place] Registered Dietitian [phone] (mobile)

## 2021-02-09 NOTE — Progress Notes (Signed)
Julie Nicholson [phone]   [phone]  OFFICE PROGRESS NOTE  [name], NP [place] [address]  DIAGNOSIS: Stage IIIB  (T1b, N3, M0) non-small cell lung cancer, adenocarcinoma she presented with right upper lobe nodule in addition to bulky right hilar, mediastinal, and left supraclavicular lymphadenopathy  DETECTED ALTERATION(S) / BIOMARKER(S) % CFDNA OR AMPLIFICATION ASSOCIATED FDA-APPROVED THERAPIES CLINICAL TRIAL AVAILABILITY [id number] ND 0.5 5 50 100 4.7%  [id number] ND 0.5 5 50 100 1.8%  [id number] ND 0.5 5 50 100 1.9%  BIOMARKER ADDITIONAL DETAILS Tumor Mutational Burden (TMB) 19.02 mut/Mb MSI Status Stable (MSS) PD-L1 Tumor Proportion Score (TPS)* <1%   PRIOR THERAPY: None   CURRENT THERAPY: Concurrent chemoradiation with carboplatin for an AUC of 2 and paclitaxel 45 mg per metered squared.  First dose on 01/05/2021.  Status post 5 cycles of treatment  INTERVAL HISTORY: Julie Nicholson 56 y.o. female returns to the clinic today for follow-up visit.  The patient is feeling fine today with no concerning complaints except for the skin burns as well as mild odynophagia from the radiation therapy.  She denied having any current chest pain, shortness of breath, cough or hemoptysis.  She denied having any fever or chills.  She has no nausea, vomiting, diarrhea or constipation.  She has no headache or visual changes.  She denied having any significant weight loss or night sweats.  She is here today for evaluation before starting cycle #6 of her treatment.  MEDICAL HISTORY: Past Medical History:  Diagnosis Date   Abdominal discomfort    Chronic headaches    due to allergies, sinus   COPD (chronic obstructive pulmonary disease) ([place])    per 2012 chest xray   pt states she doesn not have this now (04/10/2013)   Deviated nasal septum    Eustachian tube dysfunction    GERD (gastroesophageal reflux  disease)    occasional uses Tums / Rolaids   Hearing loss    right ear   High cholesterol    Migraine    "only once in a blue moon since RX'd allergy shots" (04/10/2013)   Pancreatitis 02/08/2013   Pneumonia    Rhinitis, allergic     ALLERGIES:  is allergic to benadryl [diphenhydramine].  MEDICATIONS:  Current Outpatient Medications  Medication Sig Dispense Refill   acetaminophen (TYLENOL) 500 MG tablet Take 500 mg by mouth as needed.     albuterol (VENTOLIN HFA) 108 (90 Base) MCG/ACT inhaler Inhale 1-2 puffs into the lungs every 6 (six) hours as needed for wheezing or shortness of breath. 6.7 g 0   aspirin EC 81 MG tablet Take 81 mg by mouth at bedtime.     atorvastatin (LIPITOR) 40 MG tablet Take 40 mg by mouth at bedtime.     Biotin 5000 MCG TABS Take 5,000 mcg by mouth at bedtime.     buPROPion (WELLBUTRIN SR) 150 MG 12 hr tablet Take 1 tablet (150 mg total) by mouth 2 (two) times daily. 60 tablet 5   Ca Carbonate-Mag Hydroxide (ROLAIDS PO) Take 1 tablet by mouth daily.     famotidine (PEPCID) 20 MG tablet Take 1 tablet (20 mg total) by mouth 2 (two) times daily. 60 tablet 2   fluticasone (FLONASE) 50 MCG/ACT nasal spray Place 2 sprays into both nostrils daily as needed for allergies.     ibuprofen (ADVIL) 200 MG tablet Take 200 mg by mouth as needed.  lidocaine (XYLOCAINE) 2 % solution Patient: Mix 1part 2% viscous lidocaine, 1part H20. Swallow 29m of diluted mixture, 334m before meals and at bedtime, up to QID PRN soreness. 150 mL 0   Multiple Vitamins-Minerals (HAIR SKIN AND NAILS FORMULA PO) Take 1 tablet by mouth daily.     Oxycodone HCl 10 MG TABS Take 1 tablet by mouth 3 (three) times daily as needed.     prochlorperazine (COMPAZINE) 10 MG tablet Take 1 tablet (10 mg total) by mouth every 6 (six) hours as needed. 30 tablet 2   sucralfate (CARAFATE) 1 g tablet Dissolve 1 tablet in 10 mL H20 and swallow 30 min prior to meals and bedtime. 90 tablet 0   UBRELVY 100 MG TABS  Take 100 mg by mouth daily as needed (migraine).     No current facility-administered medications for this visit.    SURGICAL HISTORY:  Past Surgical History:  Procedure Laterality Date   ABDOMINAL HYSTERECTOMY  1995   tx endometriosis, both ovaries removed   APPENDECTOMY  late 1990's   BRONCHIAL BRUSHINGS  12/15/2020   Procedure: BRONCHIAL BRUSHINGS;  Surgeon: ByCollene GobbleMD;  [facility] Cardiop[facility];;   BRONCHIAL NEEDLE ASPIRATION BIOPSY  12/15/2020   Procedure: BRONCHIAL NEEDLE ASPIRATION BIOPSIES;  Surgeon: ByCollene GobbleMD;  [facility];;   CHOLECYSTECTOMY  04/10/2013   CHOLECYSTECTOMY N/A 04/10/2013   Procedure: LAPAROSCOPIC CHOLECYSTECTOMY WITH INTRAOPERATIVE CHOLANGIOGRAM;  Surgeon: ToOdis HollingsheadMD;  [facility] Service: General;  Laterality: N/A;   ELECTROMAGNETIC NAVIGATION BROCHOSCOPY  12/15/2020   Procedure: ELECTROMAGNETIC NAVIGATION BRONCHOSCOPY;  Surgeon: ByCollene GobbleMD;  [facility];  Service: Cardiopulmonary;;   EUS N/A 02/16/2013   Procedure: UPPER ENDOSCOPIC ULTRASOUND (EUS) LINEAR;  Surgeon: DaMilus BanisterMD;  Location: [facility];  Service: Endoscopy;  Laterality: N/A;  radial linear   KNEE ARTHROSCOPY Right 1980's   "cartilage OR"   LAPAROSCOPIC ENDOMETRIOSIS FULGURATION  1980's   MYRINGOTOMY WITH TUBE PLACEMENT Right 07/13/2018   Procedure: MYRINGOTOMY WITH TUBE PLACEMENT;  Surgeon: JuMargaretha SheffieldMD;  [facility] Service: ENT;  Laterality: Right;   NASOPHARYNGOSCOPY EUSTATION TUBE BALLOON DILATION Right 07/13/2018   Procedure: NASOPHARYNGOSCOPY EUSTATION TUBE BALLOON DILATION;  Surgeon: JuMargaretha SheffieldMD;  [facility];  Laterality: Right;   TONSILLECTOMY AND ADENOIDECTOMY  ~ 1980   adenoidectomy   TUBAL LIGATION  ~ [place] 07/13/2018   Procedure: OUTFRACTURE TURBINATE;  Surgeon:  JuMargaretha SheffieldMD;  [facility]: E[facility];  Laterality: Right;   VIDEO BRONCHOSCOPY WITH ENDOBRONCHIAL ULTRASOUND N/A 12/15/2020   Procedure: ROBOTIC VIDEO BRONCHOSCOPY WITH ENDOBRONCHIAL ULTRASOUND;  Surgeon: ByCollene GobbleMD;  [facility]: Cardiopulmonary;  Laterality: N/A;   WRIST SURGERY Left    w/plate    REVIEW OF SYSTEMS:  A comprehensive review of systems was negative except for: Constitutional: positive for fatigue Gastrointestinal: positive for dyspepsia and odynophagia   PHYSICAL EXAMINATION: General appearance: alert, cooperative, fatigued, and no distress Head: Normocephalic, without obvious abnormality, atraumatic Neck: no adenopathy, no JVD, supple, symmetrical, trachea midline, and thyroid not enlarged, symmetric, no tenderness/mass/nodules Lymph nodes: Cervical, supraclavicular, and axillary nodes normal. Resp: clear to auscultation bilaterally Back: symmetric, no curvature. ROM normal. No CVA tenderness. Cardio: regular rate and rhythm, S1, S2 normal, no murmur, click, rub or gallop GI: soft, non-tender; bowel sounds normal; no masses,  no organomegaly  Extremities: extremities normal, atraumatic, no cyanosis or edema  ECOG PERFORMANCE STATUS: 1 - Symptomatic but completely ambulatory  Blood pressure 112/68, pulse 82, temperature 98.2 F (36.8 C), temperature source Temporal, resp. rate 17, height 5' 1" (1.549 m), weight 104 lb 14.4 oz (47.6 kg), SpO2 98 %.  LABORATORY DATA: Lab Results  Component Value Date   WBC 2.0 (L) 02/09/2021   HGB 12.0 02/09/2021   HCT 34.1 (L) 02/09/2021   MCV 94.7 02/09/2021   PLT 116 (L) 02/09/2021      Chemistry      Component Value Date/Time   NA 139 02/03/2021 1131   K 4.2 02/03/2021 1131   CL 105 02/03/2021 1131   CO2 26 02/03/2021 1131   BUN 13 02/03/2021 1131   CREATININE 0.66 02/03/2021 1131      Component Value Date/Time   CALCIUM 9.7 02/03/2021 1131   ALKPHOS 108  02/03/2021 1131   AST 53 (H) 02/03/2021 1131   ALT 62 (H) 02/03/2021 1131   BILITOT 0.8 02/03/2021 1131       RADIOGRAPHIC STUDIES: No results found.  ASSESSMENT AND PLAN: This is a very pleasant 56 years old white female with stage IIIB (T1b, N3, M0) non-small cell lung cancer, adenocarcinoma diagnosed in November 2022 with no actionable mutation and negative PD-L1 expression. The patient is currently undergoing a course of concurrent chemoradiation with weekly carboplatin for AUC of 2 and paclitaxel 45 Mg/M2 status post 5 cycles.  She has been tolerating her treatment well except for the mild odynophagia and skin burns. I recommended for her to proceed with cycle #6 today as planned.  She is expected to complete the course of concurrent chemoradiation next week. I will see her back for follow-up visit in 5 weeks with repeat CT scan of the chest for restaging of her disease. For the odynophagia, she will continue with Carafate as well as viscous lidocaine on as-needed basis. The patient was advised to call immediately if she has any other concerning symptoms in the interval. The patient voices understanding of current disease status and treatment options and is in agreement with the current care plan.  All questions were answered. The patient knows to call the clinic with any problems, questions or concerns. We can certainly see the patient much sooner if necessary.  The total time spent in the appointment was 30 minutes.  Disclaimer: This note was dictated with voice recognition software. Similar sounding words can inadvertently be transcribed and may not be corrected upon review.

## 2021-02-09 NOTE — Patient Instructions (Signed)
[facility]  Discharge Instructions: Thank you for choosing [facility] to provide your oncology and hematology care.   If you have a lab appointment with the [facility], please go directly to the [facility] and check in at the registration area.   Wear comfortable clothing and clothing appropriate for easy access to any Portacath or PICC line.   We strive to give you quality time with your provider. You may need to reschedule your appointment if you arrive late (15 or more minutes).  Arriving late affects you and other patients whose appointments are after yours.  Also, if you miss three or more appointments without notifying the office, you may be dismissed from the clinic at the providers discretion.      For prescription refill requests, have your pharmacy contact our office and allow 72 hours for refills to be completed.    Today you received the following chemotherapy and/or immunotherapy agents paclitaxel, carboplatin      To help prevent nausea and vomiting after your treatment, we encourage you to take your nausea medication as directed.  BELOW ARE SYMPTOMS THAT SHOULD BE REPORTED IMMEDIATELY: *FEVER GREATER THAN 100.4 F (38 C) OR HIGHER *CHILLS OR SWEATING *NAUSEA AND VOMITING THAT IS NOT CONTROLLED WITH YOUR NAUSEA MEDICATION *UNUSUAL SHORTNESS OF BREATH *UNUSUAL BRUISING OR BLEEDING *URINARY PROBLEMS (pain or burning when urinating, or frequent urination) *BOWEL PROBLEMS (unusual diarrhea, constipation, pain near the anus) TENDERNESS IN MOUTH AND THROAT WITH OR WITHOUT PRESENCE OF ULCERS (sore throat, sores in mouth, or a toothache) UNUSUAL RASH, SWELLING OR PAIN  UNUSUAL VAGINAL DISCHARGE OR ITCHING   Items with * indicate a potential emergency and should be followed up as soon as possible or go to the Emergency Department if any problems should occur.  Please show the CHEMOTHERAPY ALERT CARD or IMMUNOTHERAPY ALERT CARD at  check-in to the Emergency Department and triage nurse.  Should you have questions after your visit or need to cancel or reschedule your appointment, please contact [facility]  Dept: [phone]  and follow the prompts.  Office hours are 8:00 a.m. to 4:30 p.m. Monday - Friday. Please note that voicemails left after 4:00 p.m. may not be returned until the following business day.  We are closed weekends and major holidays. You have access to a nurse at all times for urgent questions. Please call the main number to the clinic Dept: [phone] and follow the prompts.   For any non-urgent questions, you may also contact your provider using MyChart. We now offer e-Visits for anyone 77 and older to request care online for non-urgent symptoms. For details visit [email or web address].   Also download the MyChart app! Go to the app store, search "MyChart", open the app, select [facility], and log in with your MyChart username and password.  Due to Covid, a mask is required upon entering the hospital/clinic. If you do not have a mask, one will be given to you upon arrival. For doctor visits, patients may have 1 support person aged 20 or older with them. For treatment visits, patients cannot have anyone with them due to current Covid guidelines and our immunocompromised population.

## 2021-02-10 ENCOUNTER — Ambulatory Visit: Payer: Commercial Managed Care - PPO | Admitting: Internal Medicine

## 2021-02-10 ENCOUNTER — Ambulatory Visit: Payer: Commercial Managed Care - PPO

## 2021-02-10 ENCOUNTER — Ambulatory Visit
Admission: RE | Admit: 2021-02-10 | Discharge: 2021-02-10 | Disposition: A | Discharge status: Home / Self Care | Payer: Commercial Managed Care - PPO | Source: Ambulatory Visit | Attending: Radiation Oncology | Admitting: Radiation Oncology

## 2021-02-10 ENCOUNTER — Other Ambulatory Visit: Payer: Commercial Managed Care - PPO

## 2021-02-10 DIAGNOSIS — Z5111 Encounter for antineoplastic chemotherapy: Secondary | ICD-10-CM | POA: Diagnosis not present

## 2021-02-11 ENCOUNTER — Other Ambulatory Visit: Payer: Self-pay

## 2021-02-11 ENCOUNTER — Telehealth: Payer: Self-pay | Admitting: *Deleted

## 2021-02-11 ENCOUNTER — Ambulatory Visit
Admission: RE | Admit: 2021-02-11 | Discharge: 2021-02-11 | Disposition: A | Discharge status: Home / Self Care | Payer: Commercial Managed Care - PPO | Source: Ambulatory Visit | Attending: Radiation Oncology | Admitting: Radiation Oncology

## 2021-02-11 DIAGNOSIS — C3411 Malignant neoplasm of upper lobe, right bronchus or lung: Secondary | ICD-10-CM

## 2021-02-11 DIAGNOSIS — Z5111 Encounter for antineoplastic chemotherapy: Secondary | ICD-10-CM | POA: Diagnosis not present

## 2021-02-11 MED ORDER — SONAFINE EX EMUL
1.0000 "application " | Freq: Once | CUTANEOUS | Status: AC
  Administered 2021-02-11: 1 via TOPICAL

## 2021-02-11 NOTE — Telephone Encounter (Signed)
Julie Nicholson to Forms office area after completing today's appointments.  Signed [employer] authorization for disclosure/release.  "I need your e-mail address to send the long-term disability form that was e-mailed to me.  I know it takes a while to process forms.  WIll complete employee sections before e-mailing.  Provided [email or web address] address.   [employer] Disability attending physician statement successfully faxed to [phone] at 12:12 pm.   Original copy to envelope for patient pickup.  "Will pickup later.  Just keep both forms together." Currently awaiting long term disability request.

## 2021-02-12 ENCOUNTER — Ambulatory Visit
Admission: RE | Admit: 2021-02-12 | Discharge: 2021-02-12 | Disposition: A | Discharge status: Home / Self Care | Payer: Commercial Managed Care - PPO | Source: Ambulatory Visit | Attending: Radiation Oncology | Admitting: Radiation Oncology

## 2021-02-12 DIAGNOSIS — Z5111 Encounter for antineoplastic chemotherapy: Secondary | ICD-10-CM | POA: Diagnosis not present

## 2021-02-13 ENCOUNTER — Other Ambulatory Visit: Payer: Self-pay | Admitting: Radiation Oncology

## 2021-02-13 ENCOUNTER — Ambulatory Visit
Admission: RE | Admit: 2021-02-13 | Discharge: 2021-02-13 | Disposition: A | Discharge status: Home / Self Care | Payer: Commercial Managed Care - PPO | Source: Ambulatory Visit | Attending: Radiation Oncology | Admitting: Radiation Oncology

## 2021-02-13 DIAGNOSIS — C3411 Malignant neoplasm of upper lobe, right bronchus or lung: Secondary | ICD-10-CM

## 2021-02-13 DIAGNOSIS — Z5111 Encounter for antineoplastic chemotherapy: Secondary | ICD-10-CM | POA: Diagnosis not present

## 2021-02-13 MED ORDER — BENZONATATE 100 MG PO CAPS: 100.0000 mg | ORAL_CAPSULE | Freq: Three times a day (TID) | ORAL | 0 refills | Status: DC | PRN

## 2021-02-13 MED ORDER — LIDOCAINE VISCOUS HCL 2 % MT SOLN: OROMUCOSAL | 0 refills | Status: DC

## 2021-02-16 ENCOUNTER — Ambulatory Visit
Admission: RE | Admit: 2021-02-16 | Discharge: 2021-02-16 | Disposition: A | Discharge status: Home / Self Care | Payer: Commercial Managed Care - PPO | Source: Ambulatory Visit | Attending: Radiation Oncology | Admitting: Radiation Oncology

## 2021-02-16 ENCOUNTER — Inpatient Hospital Stay: Payer: Commercial Managed Care - PPO

## 2021-02-16 ENCOUNTER — Other Ambulatory Visit: Payer: Self-pay

## 2021-02-16 VITALS — BP 133/64 | Temp 98.4°F | Resp 18 | Wt 104.5 lb

## 2021-02-16 DIAGNOSIS — C3490 Malignant neoplasm of unspecified part of unspecified bronchus or lung: Secondary | ICD-10-CM

## 2021-02-16 DIAGNOSIS — C3411 Malignant neoplasm of upper lobe, right bronchus or lung: Secondary | ICD-10-CM

## 2021-02-16 DIAGNOSIS — Z5111 Encounter for antineoplastic chemotherapy: Secondary | ICD-10-CM | POA: Diagnosis not present

## 2021-02-16 LAB — CMP (CANCER CENTER ONLY)
ALT: 27 U/L (ref 0–44)
AST: 18 U/L (ref 15–41)
Albumin: 3.9 g/dL (ref 3.5–5.0)
Alkaline Phosphatase: 104 U/L (ref 38–126)
Anion gap: 9 (ref 5–15)
BUN: 12 mg/dL (ref 6–20)
CO2: 26 mmol/L (ref 22–32)
Calcium: 9.6 mg/dL (ref 8.9–10.3)
Chloride: 106 mmol/L (ref 98–111)
Creatinine: 0.64 mg/dL (ref 0.44–1.00)
GFR, Estimated: >60 mL/min (ref >60)
Glucose, Bld: 145 mg/dL — ABNORMAL HIGH (ref 70–99)
Potassium: 3.7 mmol/L (ref 3.5–5.1)
Sodium: 141 mmol/L (ref 135–145)
Total Bilirubin: 0.7 mg/dL (ref 0.3–1.2)
Total Protein: 7.1 g/dL (ref 6.5–8.1)

## 2021-02-16 LAB — CBC WITH DIFFERENTIAL (CANCER CENTER ONLY)
Abs Immature Granulocytes: 0 10*3/uL (ref 0.00–0.07)
Basophils Absolute: 0 10*3/uL (ref 0.0–0.1)
Basophils Relative: 0 %
Eosinophils Absolute: 0 10*3/uL (ref 0.0–0.5)
Eosinophils Relative: 1 %
HCT: 29.9 % — ABNORMAL LOW (ref 36.0–46.0)
Hemoglobin: 10.4 g/dL — ABNORMAL LOW (ref 12.0–15.0)
Immature Granulocytes: 0 %
Lymphocytes Relative: 23 %
Lymphs Abs: 0.4 10*3/uL — ABNORMAL LOW (ref 0.7–4.0)
MCH: 33.2 pg (ref 26.0–34.0)
MCHC: 34.8 g/dL (ref 30.0–36.0)
MCV: 95.5 fL (ref 80.0–100.0)
Monocytes Absolute: 0.2 10*3/uL (ref 0.1–1.0)
Monocytes Relative: 8 %
Neutro Abs: 1.3 10*3/uL — ABNORMAL LOW (ref 1.7–7.7)
Neutrophils Relative %: 68 %
Platelet Count: 128 10*3/uL — ABNORMAL LOW (ref 150–400)
RBC: 3.13 MIL/uL — ABNORMAL LOW (ref 3.87–5.11)
RDW: 14.8 % (ref 11.5–15.5)
WBC Count: 1.9 10*3/uL — ABNORMAL LOW (ref 4.0–10.5)
nRBC: 0 % (ref 0.0–0.2)

## 2021-02-16 MED ORDER — FAMOTIDINE 20 MG IN NS 100 ML IVPB
20.0000 mg | Freq: Once | INTRAVENOUS | Status: AC
  Administered 2021-02-16: 20 mg via INTRAVENOUS
  Filled 2021-02-16: qty 100

## 2021-02-16 MED ORDER — SODIUM CHLORIDE 0.9 % IV SOLN
10.0000 mg | Freq: Once | INTRAVENOUS | Status: AC
  Administered 2021-02-16: 10 mg via INTRAVENOUS
  Filled 2021-02-16: qty 10

## 2021-02-16 MED ORDER — SODIUM CHLORIDE 0.9 % IV SOLN
162.2000 mg | Freq: Once | INTRAVENOUS | Status: AC
  Administered 2021-02-16: 160 mg via INTRAVENOUS
  Filled 2021-02-16: qty 16

## 2021-02-16 MED ORDER — CETIRIZINE HCL 10 MG/ML IV SOLN
10.0000 mg | Freq: Once | INTRAVENOUS | Status: AC
  Administered 2021-02-16: 10 mg via INTRAVENOUS
  Filled 2021-02-16: qty 1

## 2021-02-16 MED ORDER — SODIUM CHLORIDE 0.9 % IV SOLN: Freq: Once | INTRAVENOUS | Status: AC

## 2021-02-16 MED ORDER — SODIUM CHLORIDE 0.9 % IV SOLN
45.0000 mg/m2 | Freq: Once | INTRAVENOUS | Status: AC
  Administered 2021-02-16: 66 mg via INTRAVENOUS
  Filled 2021-02-16: qty 11

## 2021-02-16 MED ORDER — PALONOSETRON HCL INJECTION 0.25 MG/5ML
0.2500 mg | Freq: Once | INTRAVENOUS | Status: AC
  Administered 2021-02-16: 0.25 mg via INTRAVENOUS
  Filled 2021-02-16: qty 5

## 2021-02-16 NOTE — Progress Notes (Signed)
Per Dr. Julien Nordmann, ok to treat with tachycardia today.

## 2021-02-16 NOTE — Progress Notes (Signed)
Per Dr. Julien Nordmann, ok to treat with [place] of 1.3.

## 2021-02-16 NOTE — Patient Instructions (Signed)
[facility]  Discharge Instructions: Thank you for choosing [facility] to provide your oncology and hematology care.   If you have a lab appointment with the [facility], please go directly to the [facility] and check in at the registration area.   Wear comfortable clothing and clothing appropriate for easy access to any Portacath or PICC line.   We strive to give you quality time with your provider. You may need to reschedule your appointment if you arrive late (15 or more minutes).  Arriving late affects you and other patients whose appointments are after yours.  Also, if you miss three or more appointments without notifying the office, you may be dismissed from the clinic at the providers discretion.      For prescription refill requests, have your pharmacy contact our office and allow 72 hours for refills to be completed.    Today you received the following chemotherapy and/or immunotherapy agents paclitaxel, carboplatin      To help prevent nausea and vomiting after your treatment, we encourage you to take your nausea medication as directed.  BELOW ARE SYMPTOMS THAT SHOULD BE REPORTED IMMEDIATELY: *FEVER GREATER THAN 100.4 F (38 C) OR HIGHER *CHILLS OR SWEATING *NAUSEA AND VOMITING THAT IS NOT CONTROLLED WITH YOUR NAUSEA MEDICATION *UNUSUAL SHORTNESS OF BREATH *UNUSUAL BRUISING OR BLEEDING *URINARY PROBLEMS (pain or burning when urinating, or frequent urination) *BOWEL PROBLEMS (unusual diarrhea, constipation, pain near the anus) TENDERNESS IN MOUTH AND THROAT WITH OR WITHOUT PRESENCE OF ULCERS (sore throat, sores in mouth, or a toothache) UNUSUAL RASH, SWELLING OR PAIN  UNUSUAL VAGINAL DISCHARGE OR ITCHING   Items with * indicate a potential emergency and should be followed up as soon as possible or go to the Emergency Department if any problems should occur.  Please show the CHEMOTHERAPY ALERT CARD or IMMUNOTHERAPY ALERT CARD at  check-in to the Emergency Department and triage nurse.  Should you have questions after your visit or need to cancel or reschedule your appointment, please contact [facility]  Dept: [phone]  and follow the prompts.  Office hours are 8:00 a.m. to 4:30 p.m. Monday - Friday. Please note that voicemails left after 4:00 p.m. may not be returned until the following business day.  We are closed weekends and major holidays. You have access to a nurse at all times for urgent questions. Please call the main number to the clinic Dept: [phone] and follow the prompts.   For any non-urgent questions, you may also contact your provider using MyChart. We now offer e-Visits for anyone 63 and older to request care online for non-urgent symptoms. For details visit [email or web address].   Also download the MyChart app! Go to the app store, search "MyChart", open the app, select [facility], and log in with your MyChart username and password.  Due to Covid, a mask is required upon entering the hospital/clinic. If you do not have a mask, one will be given to you upon arrival. For doctor visits, patients may have 1 support person aged 62 or older with them. For treatment visits, patients cannot have anyone with them due to current Covid guidelines and our immunocompromised population.

## 2021-02-17 ENCOUNTER — Ambulatory Visit
Admission: RE | Admit: 2021-02-17 | Discharge: 2021-02-17 | Disposition: A | Discharge status: Home / Self Care | Payer: Commercial Managed Care - PPO | Source: Ambulatory Visit | Attending: Radiation Oncology | Admitting: Radiation Oncology

## 2021-02-17 ENCOUNTER — Ambulatory Visit: Payer: Commercial Managed Care - PPO

## 2021-02-17 ENCOUNTER — Other Ambulatory Visit: Payer: Commercial Managed Care - PPO

## 2021-02-17 DIAGNOSIS — Z5111 Encounter for antineoplastic chemotherapy: Secondary | ICD-10-CM | POA: Diagnosis not present

## 2021-02-18 ENCOUNTER — Ambulatory Visit
Admission: RE | Admit: 2021-02-18 | Discharge: 2021-02-18 | Disposition: A | Discharge status: Home / Self Care | Payer: Commercial Managed Care - PPO | Source: Ambulatory Visit | Attending: Radiation Oncology | Admitting: Radiation Oncology

## 2021-02-18 DIAGNOSIS — Z5111 Encounter for antineoplastic chemotherapy: Secondary | ICD-10-CM | POA: Diagnosis not present

## 2021-02-19 ENCOUNTER — Encounter: Payer: Self-pay | Admitting: Radiation Oncology

## 2021-02-19 ENCOUNTER — Other Ambulatory Visit: Payer: Self-pay

## 2021-02-19 ENCOUNTER — Ambulatory Visit
Admission: RE | Admit: 2021-02-19 | Discharge: 2021-02-19 | Disposition: A | Discharge status: Home / Self Care | Payer: Commercial Managed Care - PPO | Source: Ambulatory Visit | Attending: Radiation Oncology | Admitting: Radiation Oncology

## 2021-02-19 DIAGNOSIS — Z5111 Encounter for antineoplastic chemotherapy: Secondary | ICD-10-CM | POA: Diagnosis not present

## 2021-02-21 ENCOUNTER — Other Ambulatory Visit: Payer: Self-pay | Admitting: Hematology and Oncology

## 2021-02-21 NOTE — Progress Notes (Signed)
Received a call from accessnurse. Patient called about severe sorethroat, chest pain. Refused to go to ED.  Discussed about magic mouthwash. It looks like she has a precription for oxycodone, which she can use for pain management temporarily If she cant eat or drink, she may still have to come to the hospital. She can try baking soda mouth rinses as well Mostly she has mucositis from chemoradiation.  Julie Nicholson

## 2021-02-22 ENCOUNTER — Emergency Department (HOSPITAL_COMMUNITY): Payer: Commercial Managed Care - PPO

## 2021-02-22 ENCOUNTER — Encounter (HOSPITAL_COMMUNITY): Payer: Self-pay | Admitting: Emergency Medicine

## 2021-02-22 ENCOUNTER — Inpatient Hospital Stay (HOSPITAL_COMMUNITY)
Admission: EM | Admit: 2021-02-22 | Discharge: 2021-02-27 | DRG: 391 | Disposition: A | Discharge status: Home / Self Care | Payer: Commercial Managed Care - PPO | Attending: Internal Medicine | Admitting: Internal Medicine

## 2021-02-22 ENCOUNTER — Other Ambulatory Visit: Payer: Self-pay

## 2021-02-22 DIAGNOSIS — Z888 Allergy status to other drugs, medicaments and biological substances status: Secondary | ICD-10-CM

## 2021-02-22 DIAGNOSIS — K219 Gastro-esophageal reflux disease without esophagitis: Secondary | ICD-10-CM | POA: Diagnosis present

## 2021-02-22 DIAGNOSIS — K208 Other esophagitis without bleeding: Principal | ICD-10-CM | POA: Diagnosis present

## 2021-02-22 DIAGNOSIS — C3491 Malignant neoplasm of unspecified part of right bronchus or lung: Secondary | ICD-10-CM | POA: Diagnosis present

## 2021-02-22 DIAGNOSIS — T66XXXA Radiation sickness, unspecified, initial encounter: Secondary | ICD-10-CM

## 2021-02-22 DIAGNOSIS — C3411 Malignant neoplasm of upper lobe, right bronchus or lung: Secondary | ICD-10-CM | POA: Diagnosis present

## 2021-02-22 DIAGNOSIS — C3490 Malignant neoplasm of unspecified part of unspecified bronchus or lung: Secondary | ICD-10-CM

## 2021-02-22 DIAGNOSIS — K209 Esophagitis, unspecified without bleeding: Secondary | ICD-10-CM

## 2021-02-22 DIAGNOSIS — G43909 Migraine, unspecified, not intractable, without status migrainosus: Secondary | ICD-10-CM | POA: Diagnosis present

## 2021-02-22 DIAGNOSIS — R1310 Dysphagia, unspecified: Secondary | ICD-10-CM | POA: Diagnosis present

## 2021-02-22 DIAGNOSIS — Z90710 Acquired absence of both cervix and uterus: Secondary | ICD-10-CM

## 2021-02-22 DIAGNOSIS — H9191 Unspecified hearing loss, right ear: Secondary | ICD-10-CM | POA: Diagnosis present

## 2021-02-22 DIAGNOSIS — J432 Centrilobular emphysema: Secondary | ICD-10-CM | POA: Diagnosis present

## 2021-02-22 DIAGNOSIS — Z87891 Personal history of nicotine dependence: Secondary | ICD-10-CM

## 2021-02-22 DIAGNOSIS — Z825 Family history of asthma and other chronic lower respiratory diseases: Secondary | ICD-10-CM

## 2021-02-22 DIAGNOSIS — Z7982 Long term (current) use of aspirin: Secondary | ICD-10-CM

## 2021-02-22 DIAGNOSIS — J309 Allergic rhinitis, unspecified: Secondary | ICD-10-CM | POA: Diagnosis present

## 2021-02-22 DIAGNOSIS — R17 Unspecified jaundice: Secondary | ICD-10-CM | POA: Diagnosis present

## 2021-02-22 DIAGNOSIS — D61810 Antineoplastic chemotherapy induced pancytopenia: Secondary | ICD-10-CM | POA: Insufficient documentation

## 2021-02-22 DIAGNOSIS — T2101XA Burn of unspecified degree of chest wall, initial encounter: Secondary | ICD-10-CM | POA: Diagnosis present

## 2021-02-22 DIAGNOSIS — T451X5A Adverse effect of antineoplastic and immunosuppressive drugs, initial encounter: Secondary | ICD-10-CM | POA: Diagnosis present

## 2021-02-22 DIAGNOSIS — Z9049 Acquired absence of other specified parts of digestive tract: Secondary | ICD-10-CM

## 2021-02-22 DIAGNOSIS — E7800 Pure hypercholesterolemia, unspecified: Secondary | ICD-10-CM | POA: Diagnosis present

## 2021-02-22 DIAGNOSIS — B001 Herpesviral vesicular dermatitis: Secondary | ICD-10-CM | POA: Diagnosis present

## 2021-02-22 DIAGNOSIS — R7989 Other specified abnormal findings of blood chemistry: Secondary | ICD-10-CM

## 2021-02-22 DIAGNOSIS — Z79899 Other long term (current) drug therapy: Secondary | ICD-10-CM

## 2021-02-22 DIAGNOSIS — R070 Pain in throat: Secondary | ICD-10-CM

## 2021-02-22 DIAGNOSIS — Y842 Radiological procedure and radiotherapy as the cause of abnormal reaction of the patient, or of later complication, without mention of misadventure at the time of the procedure: Secondary | ICD-10-CM | POA: Diagnosis present

## 2021-02-22 DIAGNOSIS — D72819 Decreased white blood cell count, unspecified: Secondary | ICD-10-CM

## 2021-02-22 DIAGNOSIS — E785 Hyperlipidemia, unspecified: Secondary | ICD-10-CM

## 2021-02-22 DIAGNOSIS — R1013 Epigastric pain: Secondary | ICD-10-CM | POA: Diagnosis not present

## 2021-02-22 DIAGNOSIS — T300 Burn of unspecified body region, unspecified degree: Secondary | ICD-10-CM

## 2021-02-22 DIAGNOSIS — Z9851 Tubal ligation status: Secondary | ICD-10-CM

## 2021-02-22 DIAGNOSIS — U071 COVID-19: Secondary | ICD-10-CM | POA: Diagnosis present

## 2021-02-22 DIAGNOSIS — J342 Deviated nasal septum: Secondary | ICD-10-CM | POA: Diagnosis present

## 2021-02-22 HISTORY — DX: Radiation sickness, unspecified, initial encounter: T66.XXXA

## 2021-02-22 HISTORY — DX: Malignant (primary) neoplasm, unspecified: C80.1

## 2021-02-22 LAB — COMPREHENSIVE METABOLIC PANEL
ALT: 22 U/L (ref 0–44)
AST: 19 U/L (ref 15–41)
Albumin: 4.1 g/dL (ref 3.5–5.0)
Alkaline Phosphatase: 95 U/L (ref 38–126)
Anion gap: 17 — ABNORMAL HIGH (ref 5–15)
BUN: 12 mg/dL (ref 6–20)
CO2: 25 mmol/L (ref 22–32)
Calcium: 10 mg/dL (ref 8.9–10.3)
Chloride: 100 mmol/L (ref 98–111)
Creatinine, Ser: 0.69 mg/dL (ref 0.44–1.00)
GFR, Estimated: >60 mL/min (ref >60)
Glucose, Bld: 100 mg/dL — ABNORMAL HIGH (ref 70–99)
Potassium: 4 mmol/L (ref 3.5–5.1)
Sodium: 142 mmol/L (ref 135–145)
Total Bilirubin: 1.5 mg/dL — ABNORMAL HIGH (ref 0.3–1.2)
Total Protein: 8.1 g/dL (ref 6.5–8.1)

## 2021-02-22 LAB — CBC WITH DIFFERENTIAL/PLATELET
Abs Immature Granulocytes: 0.01 10*3/uL (ref 0.00–0.07)
Basophils Absolute: 0 10*3/uL (ref 0.0–0.1)
Basophils Relative: 1 %
Eosinophils Absolute: 0 10*3/uL (ref 0.0–0.5)
Eosinophils Relative: 0 %
HCT: 30.5 % — ABNORMAL LOW (ref 36.0–46.0)
Hemoglobin: 10.6 g/dL — ABNORMAL LOW (ref 12.0–15.0)
Immature Granulocytes: 1 %
Lymphocytes Relative: 14 %
Lymphs Abs: 0.3 10*3/uL — ABNORMAL LOW (ref 0.7–4.0)
MCH: 33.8 pg (ref 26.0–34.0)
MCHC: 34.8 g/dL (ref 30.0–36.0)
MCV: 97.1 fL (ref 80.0–100.0)
Monocytes Absolute: 0.2 10*3/uL (ref 0.1–1.0)
Monocytes Relative: 12 %
Neutro Abs: 1.4 10*3/uL — ABNORMAL LOW (ref 1.7–7.7)
Neutrophils Relative %: 72 %
Platelets: 153 10*3/uL (ref 150–400)
RBC: 3.14 MIL/uL — ABNORMAL LOW (ref 3.87–5.11)
RDW: 15.1 % (ref 11.5–15.5)
WBC: 1.9 10*3/uL — ABNORMAL LOW (ref 4.0–10.5)
nRBC: 0 % (ref 0.0–0.2)

## 2021-02-22 LAB — LIPASE, BLOOD: Lipase: 39 U/L (ref 11–51)

## 2021-02-22 LAB — TROPONIN I (HIGH SENSITIVITY)
Troponin I (High Sensitivity): 4 ng/L (ref <18)
Troponin I (High Sensitivity): 4 ng/L (ref <18)

## 2021-02-22 LAB — URINALYSIS, ROUTINE W REFLEX MICROSCOPIC
Bilirubin Urine: NEGATIVE
Glucose, UA: NEGATIVE mg/dL
Hgb urine dipstick: NEGATIVE
Ketones, ur: 20 mg/dL — AB
Leukocytes,Ua: NEGATIVE
Nitrite: NEGATIVE
Protein, ur: NEGATIVE mg/dL
Specific Gravity, Urine: >1.046 — ABNORMAL HIGH (ref 1.005–1.030)
pH: 6 (ref 5.0–8.0)

## 2021-02-22 LAB — RESP PANEL BY RT-PCR (FLU A&B, COVID) ARPGX2
Influenza A by PCR: NEGATIVE
Influenza B by PCR: NEGATIVE
SARS Coronavirus 2 by RT PCR: POSITIVE — AB

## 2021-02-22 LAB — GROUP A STREP BY PCR: Group A Strep by PCR: NOT DETECTED

## 2021-02-22 LAB — LACTIC ACID, PLASMA
Lactic Acid, Venous: 1.7 mmol/L (ref 0.5–1.9)
Lactic Acid, Venous: 0.9 mmol/L (ref 0.5–1.9)

## 2021-02-22 MED ORDER — SODIUM CHLORIDE 0.9 % IV SOLN: INTRAVENOUS | Status: DC

## 2021-02-22 MED ORDER — IOHEXOL 350 MG/ML SOLN
100.0000 mL | Freq: Once | INTRAVENOUS | Status: AC | PRN
  Administered 2021-02-22: 75 mL via INTRAVENOUS

## 2021-02-22 MED ORDER — HYDROMORPHONE HCL 1 MG/ML IJ SOLN
1.0000 mg | Freq: Once | INTRAMUSCULAR | Status: AC
  Administered 2021-02-22: 1 mg via INTRAVENOUS
  Filled 2021-02-22: qty 1

## 2021-02-22 MED ORDER — SUCRALFATE 1 GM/10ML PO SUSP
1.0000 g | Freq: Three times a day (TID) | ORAL | Status: DC
  Administered 2021-02-22 – 2021-02-27 (×17): 1 g via ORAL
  Filled 2021-02-22 (×17): qty 10

## 2021-02-22 MED ORDER — MORPHINE SULFATE (PF) 4 MG/ML IV SOLN
4.0000 mg | Freq: Once | INTRAVENOUS | Status: AC
  Administered 2021-02-22: 4 mg via INTRAVENOUS
  Filled 2021-02-22: qty 1

## 2021-02-22 MED ORDER — LIDOCAINE VISCOUS HCL 2 % MT SOLN
15.0000 mL | Freq: Once | OROMUCOSAL | Status: AC
  Administered 2021-02-22: 15 mL via OROMUCOSAL
  Filled 2021-02-22: qty 15

## 2021-02-22 MED ORDER — SODIUM CHLORIDE 0.9 % IV BOLUS
1000.0000 mL | Freq: Once | INTRAVENOUS | Status: AC
  Administered 2021-02-22: 1000 mL via INTRAVENOUS

## 2021-02-22 MED ORDER — ALBUTEROL SULFATE (2.5 MG/3ML) 0.083% IN NEBU: 2.5000 mg | INHALATION_SOLUTION | RESPIRATORY_TRACT | Status: DC | PRN

## 2021-02-22 MED ORDER — LIDOCAINE VISCOUS HCL 2 % MT SOLN
15.0000 mL | OROMUCOSAL | Status: DC | PRN
  Administered 2021-02-24 – 2021-02-27 (×9): 15 mL via OROMUCOSAL
  Filled 2021-02-22 (×2): qty 15
  Filled 2021-02-22: qty 30
  Filled 2021-02-22 (×7): qty 15
  Filled 2021-02-22: qty 30
  Filled 2021-02-22 (×2): qty 15

## 2021-02-22 MED ORDER — HYDROMORPHONE HCL 1 MG/ML IJ SOLN
0.5000 mg | INTRAMUSCULAR | Status: DC | PRN
  Administered 2021-02-22 – 2021-02-24 (×9): 1 mg via INTRAVENOUS
  Filled 2021-02-22 (×9): qty 1

## 2021-02-22 MED ORDER — PROCHLORPERAZINE EDISYLATE 10 MG/2ML IJ SOLN
10.0000 mg | Freq: Four times a day (QID) | INTRAMUSCULAR | Status: DC | PRN
  Administered 2021-02-23 – 2021-02-25 (×2): 10 mg via INTRAVENOUS
  Filled 2021-02-22 (×2): qty 2

## 2021-02-22 MED ORDER — PANTOPRAZOLE SODIUM 40 MG IV SOLR
40.0000 mg | Freq: Two times a day (BID) | INTRAVENOUS | Status: DC
  Administered 2021-02-22 – 2021-02-27 (×11): 40 mg via INTRAVENOUS
  Filled 2021-02-22 (×11): qty 40

## 2021-02-22 MED ORDER — ONDANSETRON HCL 4 MG/2ML IJ SOLN
4.0000 mg | Freq: Once | INTRAMUSCULAR | Status: AC
  Administered 2021-02-22: 4 mg via INTRAVENOUS
  Filled 2021-02-22: qty 2

## 2021-02-22 NOTE — H&P (Signed)
History and Physical    JERZEE JEROME FTD:[id number] DOB: Dec 08, 1965 DOA: 02/22/2021  PCP: [name], NP  Patient coming from: Home  Chief Complaint: throat and stomach burning.  HPI: Julie Nicholson is a 56 y.o. female with medical history significant of NSCLC on chemo/radiation, HLD, COPD, GERD. Presenting w/ odynophagia. She has a history of NSCLC on chemo and radiation. She last had radiation 3 days ago. Her pain started after. She had a burning sensation going from the midway down her throat and into her stomach. She tried the diluted viscous lidocaine as prescribe by oncology, but it didn't help. Everything was painful to swallow. She didn't have an N/V, but she has not eaten since d/t the pain. When her symptoms didn't improve this morning, she decided to come to the ED for help.    ED Course: Imaging was suggestive of esophagitis. Oncology was consulted. [facility] was called for admission.   Review of Systems:  Denies CP, palpitations, N/V/D, lightheadedness, dizziness, fevers, sick contacts. Reports throat pain/burning, stomach pain/burning. Review of systems is otherwise negative for all not mentioned in HPI.   PMHx Past Medical History:  Diagnosis Date   Abdominal discomfort    Cancer ([place])    Chronic headaches    due to allergies, sinus   COPD (chronic obstructive pulmonary disease) ([place])    per 2012 chest xray   pt states she doesn not have this now (04/10/2013)   Deviated nasal septum    Eustachian tube dysfunction    GERD (gastroesophageal reflux disease)    occasional uses Tums / Rolaids   Hearing loss    right ear   High cholesterol    Migraine    "only once in a blue moon since RX'd allergy shots" (04/10/2013)   Pancreatitis 02/08/2013   Pneumonia    Rhinitis, allergic     PSHx Past Surgical History:  Procedure Laterality Date   ABDOMINAL HYSTERECTOMY  1995   tx endometriosis, both ovaries removed   APPENDECTOMY  late 1990's   BRONCHIAL BRUSHINGS   12/15/2020   Procedure: BRONCHIAL BRUSHINGS;  Surgeon: Collene Gobble, MD;  Location: [place];  Service: Cardiopulmonary;;   BRONCHIAL NEEDLE ASPIRATION BIOPSY  12/15/2020   Procedure: BRONCHIAL NEEDLE ASPIRATION BIOPSIES;  Surgeon: Collene Gobble, MD;  Location: [place];  Service: Cardiopulmonary;;   CHOLECYSTECTOMY  04/10/2013   CHOLECYSTECTOMY N/A 04/10/2013   Procedure: LAPAROSCOPIC CHOLECYSTECTOMY WITH INTRAOPERATIVE CHOLANGIOGRAM;  Surgeon: Odis Hollingshead, MD;  Location: [place];  Service: General;  Laterality: N/A;   ELECTROMAGNETIC NAVIGATION BROCHOSCOPY  12/15/2020   Procedure: ELECTROMAGNETIC NAVIGATION BRONCHOSCOPY;  Surgeon: Collene Gobble, MD;  Location: [facility] ENDOSCOPY;  Service: Cardiopulmonary;;   EUS N/A 02/16/2013   Procedure: UPPER ENDOSCOPIC ULTRASOUND (EUS) LINEAR;  Surgeon: Milus Banister, MD;  Location: [facility] ENDOSCOPY;  Service: Endoscopy;  Laterality: N/A;  radial linear   KNEE ARTHROSCOPY Right 1980's   "cartilage OR"   LAPAROSCOPIC ENDOMETRIOSIS FULGURATION  1980's   MYRINGOTOMY WITH TUBE PLACEMENT Right 07/13/2018   Procedure: MYRINGOTOMY WITH TUBE PLACEMENT;  Surgeon: Margaretha Sheffield, MD;  Location: [place];  Service: ENT;  Laterality: Right;   NASOPHARYNGOSCOPY EUSTATION TUBE BALLOON DILATION Right 07/13/2018   Procedure: NASOPHARYNGOSCOPY EUSTATION TUBE BALLOON DILATION;  Surgeon: Margaretha Sheffield, MD;  Location: [place];  Service: ENT;  Laterality: Right;   TONSILLECTOMY AND ADENOIDECTOMY  ~ 1980   adenoidectomy   TUBAL LIGATION  ~ [place] Right 07/13/2018   Procedure:  OUTFRACTURE TURBINATE;  Surgeon: Margaretha Sheffield, MD;  Location: [place];  Service: ENT;  Laterality: Right;   VIDEO BRONCHOSCOPY WITH ENDOBRONCHIAL ULTRASOUND N/A 12/15/2020   Procedure: ROBOTIC VIDEO BRONCHOSCOPY WITH ENDOBRONCHIAL ULTRASOUND;  Surgeon: Collene Gobble, MD;  Location: [place];  Service: Cardiopulmonary;  Laterality:  N/A;   WRIST SURGERY Left    w/plate    SocHx  reports that she quit smoking about 2 months ago. Her smoking use included cigarettes. She has a 41.00 pack-year smoking history. She has never used smokeless tobacco. She reports that she does not currently use alcohol. She reports that she does not use drugs.  Allergies  Allergen Reactions   Benadryl [Diphenhydramine] Other (See Comments)    Oversedation - 50 mg IV given 01/05/21; switched to IV Certirizine.    FamHx Family History  Problem Relation Age of Onset   Diabetes Father    Diabetes Mother    COPD Mother    COPD Maternal Grandfather    COPD Maternal Aunt    COPD Maternal Uncle     Prior to Admission medications   Medication Sig Start Date End Date Taking? Authorizing Provider  acetaminophen (TYLENOL) 500 MG tablet Take 1,000 mg by mouth every 6 (six) hours as needed for moderate pain.   Yes [provider]  aspirin EC 81 MG tablet Take 81 mg by mouth at bedtime.   Yes [provider]  atorvastatin (LIPITOR) 40 MG tablet Take 40 mg by mouth at bedtime.   Yes [provider]  benzonatate (TESSALON) 100 MG capsule Take 1-2 capsules (100-200 mg total) by mouth 3 (three) times daily as needed for cough. 02/13/21  Yes Eppie Gibson, MD  Biotin 5000 MCG TABS Take 5,000 mcg by mouth at bedtime.   Yes [provider]  buPROPion (WELLBUTRIN SR) 150 MG 12 hr tablet Take 1 tablet (150 mg total) by mouth 2 (two) times daily. 01/15/21  Yes Collene Gobble, MD  Ca Carbonate-Mag Hydroxide (ROLAIDS PO) Take 1 tablet by mouth daily as needed (heartburn).   Yes [provider]  famotidine (PEPCID) 20 MG tablet Take 1 tablet (20 mg total) by mouth 2 (two) times daily. 01/15/21  Yes Collene Gobble, MD  fluticasone (FLONASE) 50 MCG/ACT nasal spray Place 2 sprays into both nostrils daily as needed for allergies.   Yes [provider]  lidocaine (XYLOCAINE) 2 % solution Patient: Mix 1part 2%  viscous lidocaine, 1part H20. Swallow 60mL of diluted mixture, 57min before meals and at bedtime, up to QID PRN soreness. 02/13/21  Yes Eppie Gibson, MD  Meloxicam POWD Take 1 packet by mouth once.   Yes [provider]  Multiple Vitamins-Minerals (HAIR SKIN AND NAILS FORMULA PO) Take 1 tablet by mouth daily.   Yes [provider]  Oxycodone HCl 10 MG TABS Take 1 tablet by mouth 3 (three) times daily as needed (pain).   Yes [provider]  prochlorperazine (COMPAZINE) 10 MG tablet Take 1 tablet (10 mg total) by mouth every 6 (six) hours as needed. Patient taking differently: Take 10 mg by mouth every 6 (six) hours as needed for nausea. 12/18/20  Yes Heilingoetter, Cassandra L, PA-C  sucralfate (CARAFATE) 1 g tablet Dissolve 1 tablet in 10 mL H20 and swallow 30 min prior to meals and bedtime. 01/19/21  Yes Eppie Gibson, MD  UBRELVY 100 MG TABS Take 100 mg by mouth daily as needed (migraine). 11/06/20  Yes [provider]  albuterol (VENTOLIN HFA)  108 (90 Base) MCG/ACT inhaler Inhale 1-2 puffs into the lungs every 6 (six) hours as needed for wheezing or shortness of breath. Patient not taking: Reported on 02/22/2021 11/27/20   Margette Fast, MD    Physical Exam: Vitals:   02/22/21 1230 02/22/21 1315 02/22/21 1333 02/22/21 1400  BP: 115/76 112/61 117/70 (!) 112/57  Pulse: (!) 106 (!) 102 (!) 102 (!) 102  Resp: 17 10 13 17   Temp:      TempSrc:      SpO2: 99% 98% 97% 99%    General: 56 y.o. female resting in bed in NAD Eyes: PERRL, normal sclera ENMT: Nares patent w/o discharge, orophaynx clear, dentition normal, ears w/o discharge/lesions/ulcers Neck: Supple, trachea midline Cardiovascular: tachy, +S1, S2, no m/g/r, equal pulses throughout Respiratory: decreased at bases, normal WOB GI: BS+, NDNT, no masses noted, no organomegaly noted MSK: No e/c/c Neuro: A&O x 3, no focal deficits Psyc: Appropriate interaction and affect, calm/cooperative  Labs on  Admission: I have personally reviewed following labs and imaging studies  CBC: Recent Labs  Lab 02/16/21 1152 02/22/21 1001  WBC 1.9* 1.9*  NEUTROABS 1.3* 1.4*  HGB 10.4* 10.6*  HCT 29.9* 30.5*  MCV 95.5 97.1  PLT 128* 016   Basic Metabolic Panel: Recent Labs  Lab 02/16/21 1152 02/22/21 1001  NA 141 142  K 3.7 4.0  CL 106 100  CO2 26 25  GLUCOSE 145* 100*  BUN 12 12  CREATININE 0.64 0.69  CALCIUM 9.6 10.0   GFR: Estimated Creatinine Clearance: 58.1 mL/min (by C-G formula based on SCr of 0.69 mg/dL). Liver Function Tests: Recent Labs  Lab 02/16/21 1152 02/22/21 1001  AST 18 19  ALT 27 22  ALKPHOS 104 95  BILITOT 0.7 1.5*  PROT 7.1 8.1  ALBUMIN 3.9 4.1   Recent Labs  Lab 02/22/21 1001  LIPASE 39   No results for input(s): AMMONIA in the last 168 hours. Coagulation Profile: No results for input(s): INR, PROTIME in the last 168 hours. Cardiac Enzymes: No results for input(s): CKTOTAL, CKMB, CKMBINDEX, TROPONINI in the last 168 hours. BNP (last 3 results) No results for input(s): PROBNP in the last 8760 hours. HbA1C: No results for input(s): HGBA1C in the last 72 hours. CBG: No results for input(s): GLUCAP in the last 168 hours. Lipid Profile: No results for input(s): CHOL, HDL, LDLCALC, TRIG, CHOLHDL, LDLDIRECT in the last 72 hours. Thyroid Function Tests: No results for input(s): TSH, T4TOTAL, FREET4, T3FREE, THYROIDAB in the last 72 hours. Anemia Panel: No results for input(s): VITAMINB12, FOLATE, FERRITIN, TIBC, IRON, RETICCTPCT in the last 72 hours. Urine analysis:    Component Value Date/Time   COLORURINE YELLOW 02/22/2021 1334   APPEARANCEUR CLEAR 02/22/2021 1334   LABSPEC >1.046 (H) 02/22/2021 1334   PHURINE 6.0 02/22/2021 1334   GLUCOSEU NEGATIVE 02/22/2021 1334   GLUCOSEU NEGATIVE 03/01/2013 0733   HGBUR NEGATIVE 02/22/2021 1334   BILIRUBINUR NEGATIVE 02/22/2021 1334   KETONESUR 20 (A) 02/22/2021 1334   PROTEINUR NEGATIVE 02/22/2021  1334   UROBILINOGEN 0.2 03/01/2013 0733   NITRITE NEGATIVE 02/22/2021 1334   LEUKOCYTESUR NEGATIVE 02/22/2021 1334    Radiological Exams on Admission: CT Angio Chest PE W and/or Wo Contrast  Result Date: 02/22/2021 CLINICAL DATA:  Shortness of breath and chest pain. Clinical concern for pulmonary embolus. History of lung cancer with abdominal pain. Elevated bilirubin and diarrhea. EXAM: CT ANGIOGRAPHY CHEST CT ABDOMEN AND PELVIS WITH CONTRAST TECHNIQUE: Multidetector CT imaging of the chest was performed  using the standard protocol during bolus administration of intravenous contrast. Multiplanar CT image reconstructions and MIPs were obtained to evaluate the vascular anatomy. Multidetector CT imaging of the abdomen and pelvis was performed using the standard protocol during bolus administration of intravenous contrast. RADIATION DOSE REDUCTION: This exam was performed according to the departmental dose-optimization program which includes automated exposure control, adjustment of the mA and/or kV according to patient size and/or use of iterative reconstruction technique. CONTRAST:  28mL OMNIPAQUE IOHEXOL 350 MG/ML SOLN COMPARISON:  PET-CT 12/12/2020.  CTA chest 11/27/2020. FINDINGS: CTA CHEST FINDINGS Cardiovascular: The heart size is normal. No substantial pericardial effusion. No thoracic aortic aneurysm. There is no filling defect within the opacified pulmonary arteries to suggest the presence of an acute pulmonary embolus. Mediastinum/Nodes: Bulky right hilar lymphadenopathy seen on the previous study has decreased with right hilar lymph node measuring 14 mm short axis today compared to 24 mm previously. Subcarinal lymphadenopathy has also decreased measuring approximate 11 mm today compared to 17 mm previously. No left hilar lymphadenopathy. Possible mild wall thickening in the mid esophagus (image 38/2). There is no axillary lymphadenopathy. Lungs/Pleura: Centrilobular and paraseptal emphysema evident.  Right upper lobe pulmonary nodule has decreased in the interval measuring 9 x 7 mm today on 52/7, decreased from 12 x 11 mm (remeasured) previously. No new suspicious nodule or mass. No focal airspace consolidation. No pleural effusion. Musculoskeletal: No worrisome lytic or sclerotic osseous abnormality. Review of the MIP images confirms the above findings. CT ABDOMEN and PELVIS FINDINGS Hepatobiliary: No suspicious focal abnormality within the liver parenchyma. Gallbladder surgically absent. Common bile duct measures 8 mm diameter in the head of the pancreas increased from 5 mm previously. Pancreas: Prominence of the main pancreatic duct in the head of pancreas is similar to prior with main pancreatic duct measuring up to 4 mm diameter. Spleen: No splenomegaly. No focal mass lesion. Adrenals/Urinary Tract: No adrenal nodule or mass. 10 mm subcapsular lesion interpolar right kidney has attenuation too high to be a simple cyst. 13 mm hypoattenuating lesion upper interpolar left kidney is likely a cyst. No evidence for hydroureter. The urinary bladder appears normal for the degree of distention. Stomach/Bowel: Stomach is unremarkable. No gastric wall thickening. No evidence of outlet obstruction. Duodenum is normally positioned as is the ligament of Treitz. No small bowel wall thickening. No small bowel dilatation. The terminal ileum is normal. The appendix is not well visualized, but there is no edema or inflammation in the region of the cecum. No gross colonic mass. No colonic wall thickening. Diverticular changes are noted in the left colon without evidence of diverticulitis. Vascular/Lymphatic: There is mild atherosclerotic calcification of the abdominal aorta without aneurysm. There is no gastrohepatic or hepatoduodenal ligament lymphadenopathy. No retroperitoneal or mesenteric lymphadenopathy. No pelvic sidewall lymphadenopathy. Reproductive: Unremarkable. Other: No intraperitoneal free fluid. Musculoskeletal:  No worrisome lytic or sclerotic osseous abnormality. Review of the MIP images confirms the above findings. IMPRESSION: 1. No evidence for acute pulmonary embolus. 2. Interval decrease in size of the right upper lobe pulmonary nodule and bulky right hilar and subcarinal lymphadenopathy. 3. No evidence for metastatic disease in the abdomen or pelvis. 4. 10 mm subcapsular lesion interpolar right kidney has attenuation too high to be a simple cyst. While this may represent a cyst complicated by proteinaceous debris or hemorrhage, neoplasm could have a similar appearance. MRI of the abdomen with and without contrast recommended to further evaluate. 5. Prominence of the main pancreatic duct in the head of pancreas is  similar to prior but slightly progressive. While likely related to prior surgery, given the reported history of elevated bilirubin, MRCP or ERCP may be warranted to further evaluate. 6. Possible mild wall thickening in the mid esophagus. Esophagitis could have this appearance. 7. Aortic Atherosclerosis (ICD10-I70.0) and Emphysema (ICD10-J43.9). Electronically Signed   By: Misty Stanley M.D.   On: 02/22/2021 12:14   CT ABDOMEN PELVIS W CONTRAST  Result Date: 02/22/2021 CLINICAL DATA:  Shortness of breath and chest pain. Clinical concern for pulmonary embolus. History of lung cancer with abdominal pain. Elevated bilirubin and diarrhea. EXAM: CT ANGIOGRAPHY CHEST CT ABDOMEN AND PELVIS WITH CONTRAST TECHNIQUE: Multidetector CT imaging of the chest was performed using the standard protocol during bolus administration of intravenous contrast. Multiplanar CT image reconstructions and MIPs were obtained to evaluate the vascular anatomy. Multidetector CT imaging of the abdomen and pelvis was performed using the standard protocol during bolus administration of intravenous contrast. RADIATION DOSE REDUCTION: This exam was performed according to the departmental dose-optimization program which includes automated  exposure control, adjustment of the mA and/or kV according to patient size and/or use of iterative reconstruction technique. CONTRAST:  28mL OMNIPAQUE IOHEXOL 350 MG/ML SOLN COMPARISON:  PET-CT 12/12/2020.  CTA chest 11/27/2020. FINDINGS: CTA CHEST FINDINGS Cardiovascular: The heart size is normal. No substantial pericardial effusion. No thoracic aortic aneurysm. There is no filling defect within the opacified pulmonary arteries to suggest the presence of an acute pulmonary embolus. Mediastinum/Nodes: Bulky right hilar lymphadenopathy seen on the previous study has decreased with right hilar lymph node measuring 14 mm short axis today compared to 24 mm previously. Subcarinal lymphadenopathy has also decreased measuring approximate 11 mm today compared to 17 mm previously. No left hilar lymphadenopathy. Possible mild wall thickening in the mid esophagus (image 38/2). There is no axillary lymphadenopathy. Lungs/Pleura: Centrilobular and paraseptal emphysema evident. Right upper lobe pulmonary nodule has decreased in the interval measuring 9 x 7 mm today on 52/7, decreased from 12 x 11 mm (remeasured) previously. No new suspicious nodule or mass. No focal airspace consolidation. No pleural effusion. Musculoskeletal: No worrisome lytic or sclerotic osseous abnormality. Review of the MIP images confirms the above findings. CT ABDOMEN and PELVIS FINDINGS Hepatobiliary: No suspicious focal abnormality within the liver parenchyma. Gallbladder surgically absent. Common bile duct measures 8 mm diameter in the head of the pancreas increased from 5 mm previously. Pancreas: Prominence of the main pancreatic duct in the head of pancreas is similar to prior with main pancreatic duct measuring up to 4 mm diameter. Spleen: No splenomegaly. No focal mass lesion. Adrenals/Urinary Tract: No adrenal nodule or mass. 10 mm subcapsular lesion interpolar right kidney has attenuation too high to be a simple cyst. 13 mm hypoattenuating lesion  upper interpolar left kidney is likely a cyst. No evidence for hydroureter. The urinary bladder appears normal for the degree of distention. Stomach/Bowel: Stomach is unremarkable. No gastric wall thickening. No evidence of outlet obstruction. Duodenum is normally positioned as is the ligament of Treitz. No small bowel wall thickening. No small bowel dilatation. The terminal ileum is normal. The appendix is not well visualized, but there is no edema or inflammation in the region of the cecum. No gross colonic mass. No colonic wall thickening. Diverticular changes are noted in the left colon without evidence of diverticulitis. Vascular/Lymphatic: There is mild atherosclerotic calcification of the abdominal aorta without aneurysm. There is no gastrohepatic or hepatoduodenal ligament lymphadenopathy. No retroperitoneal or mesenteric lymphadenopathy. No pelvic sidewall lymphadenopathy. Reproductive: Unremarkable. Other: No  intraperitoneal free fluid. Musculoskeletal: No worrisome lytic or sclerotic osseous abnormality. Review of the MIP images confirms the above findings. IMPRESSION: 1. No evidence for acute pulmonary embolus. 2. Interval decrease in size of the right upper lobe pulmonary nodule and bulky right hilar and subcarinal lymphadenopathy. 3. No evidence for metastatic disease in the abdomen or pelvis. 4. 10 mm subcapsular lesion interpolar right kidney has attenuation too high to be a simple cyst. While this may represent a cyst complicated by proteinaceous debris or hemorrhage, neoplasm could have a similar appearance. MRI of the abdomen with and without contrast recommended to further evaluate. 5. Prominence of the main pancreatic duct in the head of pancreas is similar to prior but slightly progressive. While likely related to prior surgery, given the reported history of elevated bilirubin, MRCP or ERCP may be warranted to further evaluate. 6. Possible mild wall thickening in the mid esophagus. Esophagitis  could have this appearance. 7. Aortic Atherosclerosis (ICD10-I70.0) and Emphysema (ICD10-J43.9). Electronically Signed   By: Misty Stanley M.D.   On: 02/22/2021 12:14   DG Chest Portable 1 View  Result Date: 02/22/2021 CLINICAL DATA:  Shortness of breath EXAM: PORTABLE CHEST 1 VIEW COMPARISON:  Chest x-ray 12/15/2020 FINDINGS: Heart size and mediastinal contours are within normal limits. No suspicious pulmonary opacities identified. No pleural effusion or pneumothorax visualized. No acute osseous abnormality appreciated. IMPRESSION: No acute intrathoracic process identified. Electronically Signed   By: Ofilia Neas M.D.   On: 02/22/2021 10:32    EKG: None obtained in ED  Assessment/Plan Radiation-induced esophagitis     - place in obs, med-surg     - continue viscous lidocaine up to 6 times/day PRN     - add BID protonix, QID carafate     - PRN dilaudid     - continue fluids     - Onco to follow     - [facility]; if above is not improving her situation in 48 hours, consult them  NSCLC on chemo/radiation Pancytopenia COPD     - last chemo was 6 days ago, last radiation was 3 days ago     - CBC is at baseline; no evidence of bleed, no fevers     - onco to follow      - continue home inhalers  Mild hyperbilirubinemia     - imaging w/ some prominence of the main pancreatic duct     - EDPA spoke with onco about this; recommended holding on further imaging for right now; will re-eval as her hospitalization progresses  HLD     - resume home regimen when throat improves  GERD     - carafate, protonix as above  DVT prophylaxis: SCDs  Code Status: FULL  Family Communication: None at bedside  Consults called: EDP spoke with Oncology   Status is: Observation  The patient remains OBS appropriate and will d/c before 2 midnights.  Jonnie Finner DO [facility]  If 7PM-7AM, please contact night-coverage [email or web address]  02/22/2021, 2:46 PM

## 2021-02-22 NOTE — ED Notes (Signed)
ED TO INPATIENT HANDOFF REPORT  ED Nurse Name and Phone #:   S Name/Age/Gender Julie Nicholson 56 y.o. female Room/Bed: WA01/WA01  Code Status   Code Status: Prior  Home/SNF/Other Home Patient oriented to: self, place, time, and situation Is this baseline? 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 Finished treatments on Monday, noticed esophageal pain and abdominal pain since then. Patient reports difficulty eating/drinking anything d/t pain. Hx lung cancer.  BP 118/80, P 116, RR 24, 99% RA     Allergies Allergies  Allergen Reactions   Benadryl [Diphenhydramine] Other (See Comments)    Oversedation - 50 mg IV given 01/05/21; switched to IV Certirizine.    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]  Admitting Physician: Jonnie Finner [[id number]]  Attending Physician: Jonnie Finner [[id number]]          B Medical/Surgery History Past Medical History:  Diagnosis Date   Abdominal discomfort    Cancer ([place])    Chronic headaches    due to allergies, sinus   COPD (chronic obstructive pulmonary disease) ([place])    per 2012 chest xray   pt states she doesn not have this now (04/10/2013)   Deviated nasal septum    Eustachian tube dysfunction    GERD (gastroesophageal reflux disease)    occasional uses Tums / Rolaids   Hearing loss    right ear   High cholesterol    Migraine    "only once in a blue moon since RX'd allergy shots" (04/10/2013)   Pancreatitis 02/08/2013   Pneumonia    Rhinitis, allergic    Past Surgical  History:  Procedure Laterality Date   ABDOMINAL HYSTERECTOMY  1995   tx endometriosis, both ovaries removed   APPENDECTOMY  late 1990's   BRONCHIAL BRUSHINGS  12/15/2020   Procedure: BRONCHIAL BRUSHINGS;  Surgeon: Collene Gobble, MD;  Location: [place];  Service: Cardiopulmonary;;   BRONCHIAL NEEDLE ASPIRATION BIOPSY  12/15/2020   Procedure: BRONCHIAL NEEDLE ASPIRATION BIOPSIES;  Surgeon: Collene Gobble, MD;  Location: [place];  Service: Cardiopulmonary;;   CHOLECYSTECTOMY  04/10/2013   CHOLECYSTECTOMY N/A 04/10/2013   Procedure: LAPAROSCOPIC CHOLECYSTECTOMY WITH INTRAOPERATIVE CHOLANGIOGRAM;  Surgeon: Odis Hollingshead, MD;  Location: [place];  Service: General;  Laterality: N/A;   ELECTROMAGNETIC NAVIGATION BROCHOSCOPY  12/15/2020   Procedure: ELECTROMAGNETIC NAVIGATION BRONCHOSCOPY;  Surgeon: Collene Gobble, MD;  Location: [facility] ENDOSCOPY;  Service: Cardiopulmonary;;   EUS N/A 02/16/2013   Procedure: UPPER ENDOSCOPIC ULTRASOUND (EUS) LINEAR;  Surgeon: Milus Banister, MD;  Location: [facility] ENDOSCOPY;  Service: Endoscopy;  Laterality: N/A;  radial linear   KNEE ARTHROSCOPY Right 1980's   "cartilage OR"   LAPAROSCOPIC ENDOMETRIOSIS FULGURATION  1980's   MYRINGOTOMY WITH TUBE PLACEMENT Right 07/13/2018   Procedure: MYRINGOTOMY WITH TUBE PLACEMENT;  Surgeon: Margaretha Sheffield, MD;  Location: [place];  Service: ENT;  Laterality: Right;   NASOPHARYNGOSCOPY EUSTATION TUBE BALLOON DILATION Right 07/13/2018   Procedure: NASOPHARYNGOSCOPY EUSTATION TUBE BALLOON DILATION;  Surgeon: Margaretha Sheffield, MD;  Location: [place];  Service: ENT;  Laterality: Right;   TONSILLECTOMY AND ADENOIDECTOMY  ~ 1980   adenoidectomy   TUBAL LIGATION  ~ [place] Right 07/13/2018   Procedure: OUTFRACTURE TURBINATE;  Surgeon: Margaretha Sheffield, MD;  Location: [place];  Service: ENT;  Laterality: Right;   VIDEO BRONCHOSCOPY WITH ENDOBRONCHIAL ULTRASOUND N/A 12/15/2020    Procedure: ROBOTIC VIDEO BRONCHOSCOPY WITH ENDOBRONCHIAL ULTRASOUND;  Surgeon: Collene Gobble, MD;  Location: [place];  Service: Cardiopulmonary;  Laterality: N/A;   WRIST SURGERY Left    w/plate     A IV Location/Drains/Wounds Patient Lines/Drains/Airways Status     Active Line/Drains/Airways     Name Placement date Placement time Site Days   Peripheral IV 02/22/21 20 G Anterior;Proximal;Right Forearm 02/22/21  --  Forearm  less than 1   Myringotomy Tube 07/13/18  1051  Right Ear  955   Incision (Closed) 04/10/13 Abdomen Other (Comment) 04/10/13  0815  -- 2875   Incision (Closed) 07/13/18 Ear Right 07/13/18  1057  -- 955   Incision - 4 Ports Abdomen 1: Umbilicus Mid;Upper Right;Lateral 4: Right;Medial 04/10/13  0801  -- 2875            Intake/Output Last 24 hours No intake or output data in the 24 hours ending 02/22/21 1640  Labs/Imaging Results for orders placed or performed during the hospital encounter of 02/22/21 (from the past 48 hour(s))  Group A Strep by PCR     Status: None   Collection Time: 02/22/21  9:45 AM   Specimen: Sterile Swab  Result Value Ref Range   Group A Strep by PCR NOT DETECTED NOT DETECTED    Comment: Performed at [facility], [place] [address], [place], [place] [address]  CBC with Differential     Status: Abnormal   Collection Time: 02/22/21 10:01 AM  Result Value Ref Range   WBC 1.9 (L) 4.0 - 10.5 K/uL   RBC 3.14 (L) 3.87 - 5.11 MIL/uL   Hemoglobin 10.6 (L) 12.0 - 15.0 g/dL   HCT 30.5 (L) 36.0 - 46.0 %   MCV 97.1 80.0 - 100.0 fL   MCH 33.8 26.0 - 34.0 pg   MCHC 34.8 30.0 - 36.0 g/dL   RDW 15.1 11.5 - 15.5 %   Platelets 153 150 - 400 K/uL   nRBC 0.0 0.0 - 0.2 %   Neutrophils Relative % 72 %   Neutro Abs 1.4 (L) 1.7 - 7.7 K/uL   Lymphocytes Relative 14 %   Lymphs Abs 0.3 (L) 0.7 - 4.0 K/uL   Monocytes Relative 12 %   Monocytes Absolute 0.2 0.1 - 1.0 K/uL   Eosinophils Relative 0 %   Eosinophils Absolute 0.0 0.0 -  0.5 K/uL   Basophils Relative 1 %   Basophils Absolute 0.0 0.0 - 0.1 K/uL   Immature Granulocytes 1 %   Abs Immature Granulocytes 0.01 0.00 - 0.07 K/uL    Comment: Performed at [facility], [place] [address], [place], [place] [address]  Comprehensive metabolic panel     Status: Abnormal   Collection Time: 02/22/21 10:01 AM  Result Value Ref Range   Sodium 142 135 - 145 mmol/L   Potassium 4.0 3.5 - 5.1 mmol/L   Chloride 100 98 - 111 mmol/L   CO2 25 22 - 32 mmol/L   Glucose, Bld 100 (H) 70 - 99 mg/dL    Comment: Glucose reference range applies only to samples taken after fasting for at least 8 hours.  BUN 12 6 - 20 mg/dL   Creatinine, Ser 0.69 0.44 - 1.00 mg/dL   Calcium 10.0 8.9 - 10.3 mg/dL   Total Protein 8.1 6.5 - 8.1 g/dL   Albumin 4.1 3.5 - 5.0 g/dL   AST 19 15 - 41 U/L   ALT 22 0 - 44 U/L   Alkaline Phosphatase 95 38 - 126 U/L   Total Bilirubin 1.5 (H) 0.3 - 1.2 mg/dL   GFR, Estimated >60 >60 mL/min    Comment: (NOTE) Calculated using the CKD-EPI Creatinine Equation (2021)    Anion gap 17 (H) 5 - 15    Comment: Performed at [facility], [place] [address] [place], [place] [address]  Lipase, blood     Status: None   Collection Time: 02/22/21 10:01 AM  Result Value Ref Range   Lipase 39 11 - 51 U/L    Comment: Performed at [facility], [place] [address], [place], [place] [address]  Troponin I (High Sensitivity)     Status: None   Collection Time: 02/22/21 10:01 AM  Result Value Ref Range   Troponin I (High Sensitivity) 4 <18 ng/L    Comment: (NOTE) Elevated high sensitivity troponin I (hsTnI) values and significant  changes across serial measurements may suggest ACS but many other  chronic and acute conditions are known to elevate hsTnI results.  Refer to the "Links" section for chest pain algorithms and additional  guidance. Performed at [facility], [place] [address] [place], [place] [address]    Lactic acid, plasma     Status: None   Collection Time: 02/22/21 10:02 AM  Result Value Ref Range   Lactic Acid, Venous 1.7 0.5 - 1.9 mmol/L    Comment: Performed at [facility], [place] [address], [place], [place] [address]  Resp Panel by RT-PCR (Flu A&B, Covid) Nasopharyngeal Swab     Status: Abnormal   Collection Time: 02/22/21 11:57 AM   Specimen: Nasopharyngeal Swab; Nasopharyngeal(NP) swabs in vial transport medium  Result Value Ref Range   SARS Coronavirus 2 by RT PCR POSITIVE (A) NEGATIVE    Comment: (NOTE) SARS-CoV-2 target nucleic acids are DETECTED.  The SARS-CoV-2 RNA is generally detectable in upper respiratory specimens during the acute phase of infection. Positive results are indicative of the presence of the identified virus, but do not rule out bacterial infection or co-infection with other pathogens not detected by the test. Clinical correlation with patient history and other diagnostic information is necessary to determine patient infection status. The expected result is Negative.  Fact Sheet for Patients: EntrepreneurPulse.com.au  Fact Sheet for Healthcare Providers: IncredibleEmployment.be  This test is not yet approved or cleared by the [place] FDA and  has been authorized for detection and/or diagnosis of SARS-CoV-2 by FDA under an Emergency Use Authorization (EUA).  This EUA will remain in effect (meaning this test can be used) for the duration of  the COVID-19 declaration under Section 564(b)(1) of the A ct, 21 U.S.C. section 360bbb-3(b)(1), unless the authorization is terminated or revoked sooner.     Influenza A by PCR NEGATIVE NEGATIVE   Influenza B by PCR NEGATIVE NEGATIVE    Comment: (NOTE) The Xpert Xpress SARS-CoV-2/FLU/RSV plus assay is intended as an aid in the diagnosis of influenza from Nasopharyngeal swab specimens and should not be used as a sole basis for treatment. Nasal  washings and aspirates are unacceptable for Xpert Xpress SARS-CoV-2/FLU/RSV testing.  Fact Sheet for Patients: EntrepreneurPulse.com.au  Fact  Sheet for Healthcare Providers: IncredibleEmployment.be  This test is not yet approved or cleared by the [place] and has been authorized for detection and/or diagnosis of SARS-CoV-2 by FDA under an Emergency Use Authorization (EUA). This EUA will remain in effect (meaning this test can be used) for the duration of the COVID-19 declaration under Section 564(b)(1) of the Act, 21 U.S.C. section 360bbb-3(b)(1), unless the authorization is terminated or revoked.  Performed at [facility], [place] [address], [place], [place] [address]   Lactic acid, plasma     Status: None   Collection Time: 02/22/21 12:30 PM  Result Value Ref Range   Lactic Acid, Venous 0.9 0.5 - 1.9 mmol/L    Comment: Performed at [facility], [place] [address] [place], [place] [address]  Troponin I (High Sensitivity)     Status: None   Collection Time: 02/22/21 12:30 PM  Result Value Ref Range   Troponin I (High Sensitivity) 4 <18 ng/L    Comment: (NOTE) Elevated high sensitivity troponin I (hsTnI) values and significant  changes across serial measurements may suggest ACS but many other  chronic and acute conditions are known to elevate hsTnI results.  Refer to the "Links" section for chest pain algorithms and additional  guidance. Performed at [facility], [place] [address], [place], [place] [address]   Urinalysis, Routine w reflex microscopic Urine, Clean Catch     Status: Abnormal   Collection Time: 02/22/21  1:34 PM  Result Value Ref Range   Color, Urine YELLOW YELLOW   APPearance CLEAR CLEAR   Specific Gravity, Urine >1.046 (H) 1.005 - 1.030   pH 6.0 5.0 - 8.0   Glucose, UA NEGATIVE NEGATIVE mg/dL   Hgb urine dipstick NEGATIVE NEGATIVE   Bilirubin Urine NEGATIVE  NEGATIVE   Ketones, ur 20 (A) NEGATIVE mg/dL   Protein, ur NEGATIVE NEGATIVE mg/dL   Nitrite NEGATIVE NEGATIVE   Leukocytes,Ua NEGATIVE NEGATIVE    Comment: Performed at [place] [address], [place], [place] [address]   CT Angio Chest PE W and/or Wo Contrast  Result Date: 02/22/2021 CLINICAL DATA:  Shortness of breath and chest pain. Clinical concern for pulmonary embolus. History of lung cancer with abdominal pain. Elevated bilirubin and diarrhea. EXAM: CT ANGIOGRAPHY CHEST CT ABDOMEN AND PELVIS WITH CONTRAST TECHNIQUE: Multidetector CT imaging of the chest was performed using the standard protocol during bolus administration of intravenous contrast. Multiplanar CT image reconstructions and MIPs were obtained to evaluate the vascular anatomy. Multidetector CT imaging of the abdomen and pelvis was performed using the standard protocol during bolus administration of intravenous contrast. RADIATION DOSE REDUCTION: This exam was performed according to the departmental dose-optimization program which includes automated exposure control, adjustment of the mA and/or kV according to patient size and/or use of iterative reconstruction technique. CONTRAST:  66mL OMNIPAQUE IOHEXOL 350 MG/ML SOLN COMPARISON:  PET-CT 12/12/2020.  CTA chest 11/27/2020. FINDINGS: CTA CHEST FINDINGS Cardiovascular: The heart size is normal. No substantial pericardial effusion. No thoracic aortic aneurysm. There is no filling defect within the opacified pulmonary arteries to suggest the presence of an acute pulmonary embolus. Mediastinum/Nodes: Bulky right hilar lymphadenopathy seen on the previous study has decreased with right hilar lymph node measuring 14 mm short axis today compared to 24 mm previously. Subcarinal lymphadenopathy has also decreased measuring approximate 11 mm today compared to 17 mm previously. No left hilar lymphadenopathy. Possible mild wall thickening in the mid esophagus (image 38/2).  There is  no axillary lymphadenopathy. Lungs/Pleura: Centrilobular and paraseptal emphysema evident. Right upper lobe pulmonary nodule has decreased in the interval measuring 9 x 7 mm today on 52/7, decreased from 12 x 11 mm (remeasured) previously. No new suspicious nodule or mass. No focal airspace consolidation. No pleural effusion. Musculoskeletal: No worrisome lytic or sclerotic osseous abnormality. Review of the MIP images confirms the above findings. CT ABDOMEN and PELVIS FINDINGS Hepatobiliary: No suspicious focal abnormality within the liver parenchyma. Gallbladder surgically absent. Common bile duct measures 8 mm diameter in the head of the pancreas increased from 5 mm previously. Pancreas: Prominence of the main pancreatic duct in the head of pancreas is similar to prior with main pancreatic duct measuring up to 4 mm diameter. Spleen: No splenomegaly. No focal mass lesion. Adrenals/Urinary Tract: No adrenal nodule or mass. 10 mm subcapsular lesion interpolar right kidney has attenuation too high to be a simple cyst. 13 mm hypoattenuating lesion upper interpolar left kidney is likely a cyst. No evidence for hydroureter. The urinary bladder appears normal for the degree of distention. Stomach/Bowel: Stomach is unremarkable. No gastric wall thickening. No evidence of outlet obstruction. Duodenum is normally positioned as is the ligament of Treitz. No small bowel wall thickening. No small bowel dilatation. The terminal ileum is normal. The appendix is not well visualized, but there is no edema or inflammation in the region of the cecum. No gross colonic mass. No colonic wall thickening. Diverticular changes are noted in the left colon without evidence of diverticulitis. Vascular/Lymphatic: There is mild atherosclerotic calcification of the abdominal aorta without aneurysm. There is no gastrohepatic or hepatoduodenal ligament lymphadenopathy. No retroperitoneal or mesenteric lymphadenopathy. No pelvic sidewall  lymphadenopathy. Reproductive: Unremarkable. Other: No intraperitoneal free fluid. Musculoskeletal: No worrisome lytic or sclerotic osseous abnormality. Review of the MIP images confirms the above findings. IMPRESSION: 1. No evidence for acute pulmonary embolus. 2. Interval decrease in size of the right upper lobe pulmonary nodule and bulky right hilar and subcarinal lymphadenopathy. 3. No evidence for metastatic disease in the abdomen or pelvis. 4. 10 mm subcapsular lesion interpolar right kidney has attenuation too high to be a simple cyst. While this may represent a cyst complicated by proteinaceous debris or hemorrhage, neoplasm could have a similar appearance. MRI of the abdomen with and without contrast recommended to further evaluate. 5. Prominence of the main pancreatic duct in the head of pancreas is similar to prior but slightly progressive. While likely related to prior surgery, given the reported history of elevated bilirubin, MRCP or ERCP may be warranted to further evaluate. 6. Possible mild wall thickening in the mid esophagus. Esophagitis could have this appearance. 7. Aortic Atherosclerosis (ICD10-I70.0) and Emphysema (ICD10-J43.9). Electronically Signed   By: Misty Stanley M.D.   On: 02/22/2021 12:14   CT ABDOMEN PELVIS W CONTRAST  Result Date: 02/22/2021 CLINICAL DATA:  Shortness of breath and chest pain. Clinical concern for pulmonary embolus. History of lung cancer with abdominal pain. Elevated bilirubin and diarrhea. EXAM: CT ANGIOGRAPHY CHEST CT ABDOMEN AND PELVIS WITH CONTRAST TECHNIQUE: Multidetector CT imaging of the chest was performed using the standard protocol during bolus administration of intravenous contrast. Multiplanar CT image reconstructions and MIPs were obtained to evaluate the vascular anatomy. Multidetector CT imaging of the abdomen and pelvis was performed using the standard protocol during bolus administration of intravenous contrast. RADIATION DOSE REDUCTION: This exam  was performed according to the departmental dose-optimization program which includes automated exposure control, adjustment of the mA and/or kV according to patient size  and/or use of iterative reconstruction technique. CONTRAST:  39mL OMNIPAQUE IOHEXOL 350 MG/ML SOLN COMPARISON:  PET-CT 12/12/2020.  CTA chest 11/27/2020. FINDINGS: CTA CHEST FINDINGS Cardiovascular: The heart size is normal. No substantial pericardial effusion. No thoracic aortic aneurysm. There is no filling defect within the opacified pulmonary arteries to suggest the presence of an acute pulmonary embolus. Mediastinum/Nodes: Bulky right hilar lymphadenopathy seen on the previous study has decreased with right hilar lymph node measuring 14 mm short axis today compared to 24 mm previously. Subcarinal lymphadenopathy has also decreased measuring approximate 11 mm today compared to 17 mm previously. No left hilar lymphadenopathy. Possible mild wall thickening in the mid esophagus (image 38/2). There is no axillary lymphadenopathy. Lungs/Pleura: Centrilobular and paraseptal emphysema evident. Right upper lobe pulmonary nodule has decreased in the interval measuring 9 x 7 mm today on 52/7, decreased from 12 x 11 mm (remeasured) previously. No new suspicious nodule or mass. No focal airspace consolidation. No pleural effusion. Musculoskeletal: No worrisome lytic or sclerotic osseous abnormality. Review of the MIP images confirms the above findings. CT ABDOMEN and PELVIS FINDINGS Hepatobiliary: No suspicious focal abnormality within the liver parenchyma. Gallbladder surgically absent. Common bile duct measures 8 mm diameter in the head of the pancreas increased from 5 mm previously. Pancreas: Prominence of the main pancreatic duct in the head of pancreas is similar to prior with main pancreatic duct measuring up to 4 mm diameter. Spleen: No splenomegaly. No focal mass lesion. Adrenals/Urinary Tract: No adrenal nodule or mass. 10 mm subcapsular lesion  interpolar right kidney has attenuation too high to be a simple cyst. 13 mm hypoattenuating lesion upper interpolar left kidney is likely a cyst. No evidence for hydroureter. The urinary bladder appears normal for the degree of distention. Stomach/Bowel: Stomach is unremarkable. No gastric wall thickening. No evidence of outlet obstruction. Duodenum is normally positioned as is the ligament of Treitz. No small bowel wall thickening. No small bowel dilatation. The terminal ileum is normal. The appendix is not well visualized, but there is no edema or inflammation in the region of the cecum. No gross colonic mass. No colonic wall thickening. Diverticular changes are noted in the left colon without evidence of diverticulitis. Vascular/Lymphatic: There is mild atherosclerotic calcification of the abdominal aorta without aneurysm. There is no gastrohepatic or hepatoduodenal ligament lymphadenopathy. No retroperitoneal or mesenteric lymphadenopathy. No pelvic sidewall lymphadenopathy. Reproductive: Unremarkable. Other: No intraperitoneal free fluid. Musculoskeletal: No worrisome lytic or sclerotic osseous abnormality. Review of the MIP images confirms the above findings. IMPRESSION: 1. No evidence for acute pulmonary embolus. 2. Interval decrease in size of the right upper lobe pulmonary nodule and bulky right hilar and subcarinal lymphadenopathy. 3. No evidence for metastatic disease in the abdomen or pelvis. 4. 10 mm subcapsular lesion interpolar right kidney has attenuation too high to be a simple cyst. While this may represent a cyst complicated by proteinaceous debris or hemorrhage, neoplasm could have a similar appearance. MRI of the abdomen with and without contrast recommended to further evaluate. 5. Prominence of the main pancreatic duct in the head of pancreas is similar to prior but slightly progressive. While likely related to prior surgery, given the reported history of elevated bilirubin, MRCP or ERCP may be  warranted to further evaluate. 6. Possible mild wall thickening in the mid esophagus. Esophagitis could have this appearance. 7. Aortic Atherosclerosis (ICD10-I70.0) and Emphysema (ICD10-J43.9). Electronically Signed   By: Misty Stanley M.D.   On: 02/22/2021 12:14   DG Chest Portable 1 View  Result Date: 02/22/2021 CLINICAL DATA:  Shortness of breath EXAM: PORTABLE CHEST 1 VIEW COMPARISON:  Chest x-ray 12/15/2020 FINDINGS: Heart size and mediastinal contours are within normal limits. No suspicious pulmonary opacities identified. No pleural effusion or pneumothorax visualized. No acute osseous abnormality appreciated. IMPRESSION: No acute intrathoracic process identified. Electronically Signed   By: Ofilia Neas M.D.   On: 02/22/2021 10:32    Pending Labs Unresulted Labs (From admission, onward)     Start     Ordered   02/22/21 0943  Blood culture (routine x 2)  BLOOD CULTURE X 2,   STAT      02/22/21 3748   Signed and Held  HIV Antibody (routine testing w rflx)  (HIV Antibody (Routine testing w reflex) panel)  Once,   R        Signed and Held   Signed and Held  Comprehensive metabolic panel  Tomorrow morning,   R        Signed and Held   Signed and Held  CBC  Tomorrow morning,   R        Signed and Held            Vitals/Pain Today's Vitals   02/22/21 1333 02/22/21 1400 02/22/21 1500 02/22/21 1501  BP: 117/70 (!) 112/57 122/68   Pulse: (!) 102 (!) 102 (!) 104   Resp: 13 17 (!) 22   Temp:      TempSrc:      SpO2: 97% 99% 99%   PainSc:    4     Isolation Precautions No active isolations  Medications Medications  pantoprazole (PROTONIX) injection 40 mg (has no administration in time range)  sucralfate (CARAFATE) 1 GM/10ML suspension 1 g (has no administration in time range)  lidocaine (XYLOCAINE) 2 % viscous mouth solution 15 mL (has no administration in time range)  HYDROmorphone (DILAUDID) injection 0.5-1 mg (has no administration in time range)  ondansetron (ZOFRAN)  injection 4 mg (4 mg Intravenous Given 02/22/21 1009)  sodium chloride 0.9 % bolus 1,000 mL (1,000 mLs Intravenous Bolus 02/22/21 1009)  morphine 4 MG/ML injection 4 mg (4 mg Intravenous Given 02/22/21 1009)  iohexol (OMNIPAQUE) 350 MG/ML injection 100 mL (75 mLs Intravenous Contrast Given 02/22/21 1104)  sodium chloride 0.9 % bolus 1,000 mL (1,000 mLs Intravenous Bolus 02/22/21 1227)  morphine 4 MG/ML injection 4 mg (4 mg Intravenous Given 02/22/21 1227)  lidocaine (XYLOCAINE) 2 % viscous mouth solution 15 mL (15 mLs Mouth/Throat Given 02/22/21 1352)  HYDROmorphone (DILAUDID) injection 1 mg (1 mg Intravenous Given 02/22/21 1352)    Mobility walks Low fall risk   Focused Assessments    R Recommendations: See Admitting Provider Note  Report given to:   Additional Notes:

## 2021-02-22 NOTE — ED Provider Notes (Signed)
[facility] DEPT Provider Note   CSN: [id number] Arrival date & time: 02/22/21  1308    History  Chief Complaint  Patient presents with   Dysphagia    Julie Nicholson is a 56 y.o. 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  Yesterday had some chills without documented fever, generalized myalgias.  Since getting chemo and radiation she has noted progressively worsening pain with swallowing.  Patient states even when she drinks water it feels like a burning sensation from her esophagus all the way into her abdomen.  She does also report a new cough without hemoptysis.  No lower extremity swelling, pain.  No history of PE or DVT.  Yesterday had a few episodes of diarrhea without melena or blood per rectum.  Has been doing viscous lidocaine at home however is not helping with her throat pain.  Difficulty tolerating p.o. due to the pain. Having severe upper abd pain. Occasional CP which she cannot describe, non exertional in nature however is pleuritic.  HPI     Home Medications Prior to Admission medications   Medication Sig Start Date End Date Taking? Authorizing Provider  acetaminophen (TYLENOL) 500 MG tablet Take 1,000 mg by mouth every 6 (six) hours as needed for moderate pain.   Yes [provider]  aspirin EC 81 MG tablet Take 81 mg by mouth at bedtime.   Yes [provider]  atorvastatin (LIPITOR) 40 MG tablet Take 40 mg by mouth at bedtime.   Yes [provider]  benzonatate (TESSALON) 100 MG capsule Take 1-2 capsules (100-200 mg total) by mouth 3 (three) times daily as needed for cough. 02/13/21  Yes Eppie Gibson, MD  Biotin 5000 MCG TABS Take 5,000 mcg by mouth at bedtime.   Yes [provider]  buPROPion (WELLBUTRIN SR) 150 MG 12 hr tablet Take 1 tablet (150 mg total) by mouth 2 (two) times daily. 01/15/21  Yes Collene Gobble, MD   Ca Carbonate-Mag Hydroxide (ROLAIDS PO) Take 1 tablet by mouth daily as needed (heartburn).   Yes [provider]  famotidine (PEPCID) 20 MG tablet Take 1 tablet (20 mg total) by mouth 2 (two) times daily. 01/15/21  Yes Collene Gobble, MD  fluticasone (FLONASE) 50 MCG/ACT nasal spray Place 2 sprays into both nostrils daily as needed for allergies.   Yes [provider]  lidocaine (XYLOCAINE) 2 % solution Patient: Mix 1part 2% viscous lidocaine, 1part H20. Swallow 84mL of diluted mixture, 52min before meals and at bedtime, up to QID PRN soreness. 02/13/21  Yes Eppie Gibson, MD  Meloxicam POWD Take 1 packet by mouth once.   Yes [provider]  Multiple Vitamins-Minerals (HAIR SKIN AND NAILS FORMULA PO) Take 1 tablet by mouth daily.   Yes [provider]  Oxycodone HCl 10 MG TABS Take 1 tablet by mouth 3 (three) times daily as needed (pain).   Yes [provider]  prochlorperazine (COMPAZINE) 10 MG tablet Take 1 tablet (10 mg total) by mouth every 6 (six) hours as needed. Patient taking differently: Take 10 mg by mouth every 6 (six) hours as needed for nausea. 12/18/20  Yes Heilingoetter, Cassandra L, PA-C  sucralfate (CARAFATE) 1 g tablet Dissolve 1 tablet in 10 mL H20 and swallow 30 min prior to meals and bedtime. 01/19/21  Yes Eppie Gibson, MD  UBRELVY 100 MG TABS Take 100 mg by mouth daily as needed (migraine). 11/06/20  Yes [provider]  albuterol (VENTOLIN HFA) 108 (90 Base) MCG/ACT inhaler Inhale 1-2 puffs into the lungs every 6 (six) hours as needed for wheezing or shortness of breath. Patient not taking: Reported on 02/22/2021 11/27/20   Margette Fast, MD      Allergies    Benadryl [diphenhydramine]    Review of Systems   Review of Systems  Constitutional:  Positive for activity change, appetite change, chills and fatigue.  HENT: Negative.    Respiratory:  Positive for cough. Negative for shortness of breath.   Cardiovascular:         Pain into her chest when eating  Gastrointestinal:  Positive for abdominal pain and nausea. Negative for anal bleeding, blood in stool, constipation, diarrhea, rectal pain and vomiting.  Genitourinary: Negative.   Musculoskeletal:  Positive for myalgias.  Skin:  Positive for rash.       Radiation burns to anterior/posterior chest wall  Neurological:  Positive for weakness. Negative for dizziness, syncope, facial asymmetry, light-headedness, numbness and headaches.  All other systems reviewed and are negative.  Physical Exam Updated Vital Signs BP 122/68    Pulse (!) 104    Temp 98.9 F (37.2 C) (Oral)    Resp (!) 22    SpO2 99%  Physical Exam Vitals and nursing note reviewed.  Constitutional:      General: She is not in acute distress.    Appearance: She is well-developed. She is ill-appearing (chronically ill appearing). She is not toxic-appearing or diaphoretic.  HENT:     Head: Normocephalic and atraumatic.     Nose: Nose normal. No congestion or rhinorrhea.     Mouth/Throat:     Pharynx: Posterior oropharyngeal erythema present.     Comments: Mucus membranes erythematous, no large ulcerations.  Tongue midline. Tolerating secretions. No tonsillar exudate, evidence of PTA, RPA Eyes:     Pupils: Pupils are equal, round, and reactive to light.  Cardiovascular:     Rate and Rhythm: Normal rate.  Pulmonary:     Effort: No respiratory distress.  Abdominal:     General: There is no distension.  Musculoskeletal:        General: Normal range of motion.     Cervical back: Normal range of motion.     Comments: No bony tenderness, Full ROM without difficulty. Compartments soft  Skin:    General: Skin is warm and dry.     Comments: Radiation burn to anterior posterior chest wall.  No blistering lesions.  Neurological:     General: No focal deficit present.     Mental Status: She is alert and oriented to person, place, and time.     Comments: CN 2-12 grossly intact   Psychiatric:        Mood and Affect: Mood normal.    ED Results / Procedures / Treatments   Labs (all labs ordered are listed, but only abnormal results are displayed) Labs Reviewed  RESP PANEL BY RT-PCR (FLU A&B, COVID) ARPGX2 - Abnormal; Notable for the following components:      Result Value   SARS Coronavirus 2 by RT PCR POSITIVE (*)    All other components within normal limits  CBC WITH DIFFERENTIAL/PLATELET - Abnormal; Notable for the following components:   WBC 1.9 (*)    RBC 3.14 (*)    Hemoglobin 10.6 (*)    HCT 30.5 (*)    Neutro Abs 1.4 (*)    Lymphs Abs 0.3 (*)    All other components within  normal limits  COMPREHENSIVE METABOLIC PANEL - Abnormal; Notable for the following components:   Glucose, Bld 100 (*)    Total Bilirubin 1.5 (*)    Anion gap 17 (*)    All other components within normal limits  URINALYSIS, ROUTINE W REFLEX MICROSCOPIC - Abnormal; Notable for the following components:   Specific Gravity, Urine >1.046 (*)    Ketones, ur 20 (*)    All other components within normal limits  GROUP A STREP BY PCR  CULTURE, BLOOD (ROUTINE X 2)  CULTURE, BLOOD (ROUTINE X 2)  LIPASE, BLOOD  LACTIC ACID, PLASMA  LACTIC ACID, PLASMA  TROPONIN I (HIGH SENSITIVITY)  TROPONIN I (HIGH SENSITIVITY)    EKG None  Radiology CT Angio Chest PE W and/or Wo Contrast  Result Date: 02/22/2021 CLINICAL DATA:  Shortness of breath and chest pain. Clinical concern for pulmonary embolus. History of lung cancer with abdominal pain. Elevated bilirubin and diarrhea. EXAM: CT ANGIOGRAPHY CHEST CT ABDOMEN AND PELVIS WITH CONTRAST TECHNIQUE: Multidetector CT imaging of the chest was performed using the standard protocol during bolus administration of intravenous contrast. Multiplanar CT image reconstructions and MIPs were obtained to evaluate the vascular anatomy. Multidetector CT imaging of the abdomen and pelvis was performed using the standard protocol during bolus administration of  intravenous contrast. RADIATION DOSE REDUCTION: This exam was performed according to the departmental dose-optimization program which includes automated exposure control, adjustment of the mA and/or kV according to patient size and/or use of iterative reconstruction technique. CONTRAST:  4mL OMNIPAQUE IOHEXOL 350 MG/ML SOLN COMPARISON:  PET-CT 12/12/2020.  CTA chest 11/27/2020. FINDINGS: CTA CHEST FINDINGS Cardiovascular: The heart size is normal. No substantial pericardial effusion. No thoracic aortic aneurysm. There is no filling defect within the opacified pulmonary arteries to suggest the presence of an acute pulmonary embolus. Mediastinum/Nodes: Bulky right hilar lymphadenopathy seen on the previous study has decreased with right hilar lymph node measuring 14 mm short axis today compared to 24 mm previously. Subcarinal lymphadenopathy has also decreased measuring approximate 11 mm today compared to 17 mm previously. No left hilar lymphadenopathy. Possible mild wall thickening in the mid esophagus (image 38/2). There is no axillary lymphadenopathy. Lungs/Pleura: Centrilobular and paraseptal emphysema evident. Right upper lobe pulmonary nodule has decreased in the interval measuring 9 x 7 mm today on 52/7, decreased from 12 x 11 mm (remeasured) previously. No new suspicious nodule or mass. No focal airspace consolidation. No pleural effusion. Musculoskeletal: No worrisome lytic or sclerotic osseous abnormality. Review of the MIP images confirms the above findings. CT ABDOMEN and PELVIS FINDINGS Hepatobiliary: No suspicious focal abnormality within the liver parenchyma. Gallbladder surgically absent. Common bile duct measures 8 mm diameter in the head of the pancreas increased from 5 mm previously. Pancreas: Prominence of the main pancreatic duct in the head of pancreas is similar to prior with main pancreatic duct measuring up to 4 mm diameter. Spleen: No splenomegaly. No focal mass lesion. Adrenals/Urinary  Tract: No adrenal nodule or mass. 10 mm subcapsular lesion interpolar right kidney has attenuation too high to be a simple cyst. 13 mm hypoattenuating lesion upper interpolar left kidney is likely a cyst. No evidence for hydroureter. The urinary bladder appears normal for the degree of distention. Stomach/Bowel: Stomach is unremarkable. No gastric wall thickening. No evidence of outlet obstruction. Duodenum is normally positioned as is the ligament of Treitz. No small bowel wall thickening. No small bowel dilatation. The terminal ileum is normal. The appendix is not well visualized, but there is no  edema or inflammation in the region of the cecum. No gross colonic mass. No colonic wall thickening. Diverticular changes are noted in the left colon without evidence of diverticulitis. Vascular/Lymphatic: There is mild atherosclerotic calcification of the abdominal aorta without aneurysm. There is no gastrohepatic or hepatoduodenal ligament lymphadenopathy. No retroperitoneal or mesenteric lymphadenopathy. No pelvic sidewall lymphadenopathy. Reproductive: Unremarkable. Other: No intraperitoneal free fluid. Musculoskeletal: No worrisome lytic or sclerotic osseous abnormality. Review of the MIP images confirms the above findings. IMPRESSION: 1. No evidence for acute pulmonary embolus. 2. Interval decrease in size of the right upper lobe pulmonary nodule and bulky right hilar and subcarinal lymphadenopathy. 3. No evidence for metastatic disease in the abdomen or pelvis. 4. 10 mm subcapsular lesion interpolar right kidney has attenuation too high to be a simple cyst. While this may represent a cyst complicated by proteinaceous debris or hemorrhage, neoplasm could have a similar appearance. MRI of the abdomen with and without contrast recommended to further evaluate. 5. Prominence of the main pancreatic duct in the head of pancreas is similar to prior but slightly progressive. While likely related to prior surgery, given the  reported history of elevated bilirubin, MRCP or ERCP may be warranted to further evaluate. 6. Possible mild wall thickening in the mid esophagus. Esophagitis could have this appearance. 7. Aortic Atherosclerosis (ICD10-I70.0) and Emphysema (ICD10-J43.9). Electronically Signed   By: Misty Stanley M.D.   On: 02/22/2021 12:14   CT ABDOMEN PELVIS W CONTRAST  Result Date: 02/22/2021 CLINICAL DATA:  Shortness of breath and chest pain. Clinical concern for pulmonary embolus. History of lung cancer with abdominal pain. Elevated bilirubin and diarrhea. EXAM: CT ANGIOGRAPHY CHEST CT ABDOMEN AND PELVIS WITH CONTRAST TECHNIQUE: Multidetector CT imaging of the chest was performed using the standard protocol during bolus administration of intravenous contrast. Multiplanar CT image reconstructions and MIPs were obtained to evaluate the vascular anatomy. Multidetector CT imaging of the abdomen and pelvis was performed using the standard protocol during bolus administration of intravenous contrast. RADIATION DOSE REDUCTION: This exam was performed according to the departmental dose-optimization program which includes automated exposure control, adjustment of the mA and/or kV according to patient size and/or use of iterative reconstruction technique. CONTRAST:  72mL OMNIPAQUE IOHEXOL 350 MG/ML SOLN COMPARISON:  PET-CT 12/12/2020.  CTA chest 11/27/2020. FINDINGS: CTA CHEST FINDINGS Cardiovascular: The heart size is normal. No substantial pericardial effusion. No thoracic aortic aneurysm. There is no filling defect within the opacified pulmonary arteries to suggest the presence of an acute pulmonary embolus. Mediastinum/Nodes: Bulky right hilar lymphadenopathy seen on the previous study has decreased with right hilar lymph node measuring 14 mm short axis today compared to 24 mm previously. Subcarinal lymphadenopathy has also decreased measuring approximate 11 mm today compared to 17 mm previously. No left hilar lymphadenopathy.  Possible mild wall thickening in the mid esophagus (image 38/2). There is no axillary lymphadenopathy. Lungs/Pleura: Centrilobular and paraseptal emphysema evident. Right upper lobe pulmonary nodule has decreased in the interval measuring 9 x 7 mm today on 52/7, decreased from 12 x 11 mm (remeasured) previously. No new suspicious nodule or mass. No focal airspace consolidation. No pleural effusion. Musculoskeletal: No worrisome lytic or sclerotic osseous abnormality. Review of the MIP images confirms the above findings. CT ABDOMEN and PELVIS FINDINGS Hepatobiliary: No suspicious focal abnormality within the liver parenchyma. Gallbladder surgically absent. Common bile duct measures 8 mm diameter in the head of the pancreas increased from 5 mm previously. Pancreas: Prominence of the main pancreatic duct in the head of  pancreas is similar to prior with main pancreatic duct measuring up to 4 mm diameter. Spleen: No splenomegaly. No focal mass lesion. Adrenals/Urinary Tract: No adrenal nodule or mass. 10 mm subcapsular lesion interpolar right kidney has attenuation too high to be a simple cyst. 13 mm hypoattenuating lesion upper interpolar left kidney is likely a cyst. No evidence for hydroureter. The urinary bladder appears normal for the degree of distention. Stomach/Bowel: Stomach is unremarkable. No gastric wall thickening. No evidence of outlet obstruction. Duodenum is normally positioned as is the ligament of Treitz. No small bowel wall thickening. No small bowel dilatation. The terminal ileum is normal. The appendix is not well visualized, but there is no edema or inflammation in the region of the cecum. No gross colonic mass. No colonic wall thickening. Diverticular changes are noted in the left colon without evidence of diverticulitis. Vascular/Lymphatic: There is mild atherosclerotic calcification of the abdominal aorta without aneurysm. There is no gastrohepatic or hepatoduodenal ligament lymphadenopathy. No  retroperitoneal or mesenteric lymphadenopathy. No pelvic sidewall lymphadenopathy. Reproductive: Unremarkable. Other: No intraperitoneal free fluid. Musculoskeletal: No worrisome lytic or sclerotic osseous abnormality. Review of the MIP images confirms the above findings. IMPRESSION: 1. No evidence for acute pulmonary embolus. 2. Interval decrease in size of the right upper lobe pulmonary nodule and bulky right hilar and subcarinal lymphadenopathy. 3. No evidence for metastatic disease in the abdomen or pelvis. 4. 10 mm subcapsular lesion interpolar right kidney has attenuation too high to be a simple cyst. While this may represent a cyst complicated by proteinaceous debris or hemorrhage, neoplasm could have a similar appearance. MRI of the abdomen with and without contrast recommended to further evaluate. 5. Prominence of the main pancreatic duct in the head of pancreas is similar to prior but slightly progressive. While likely related to prior surgery, given the reported history of elevated bilirubin, MRCP or ERCP may be warranted to further evaluate. 6. Possible mild wall thickening in the mid esophagus. Esophagitis could have this appearance. 7. Aortic Atherosclerosis (ICD10-I70.0) and Emphysema (ICD10-J43.9). Electronically Signed   By: Misty Stanley M.D.   On: 02/22/2021 12:14   DG Chest Portable 1 View  Result Date: 02/22/2021 CLINICAL DATA:  Shortness of breath EXAM: PORTABLE CHEST 1 VIEW COMPARISON:  Chest x-ray 12/15/2020 FINDINGS: Heart size and mediastinal contours are within normal limits. No suspicious pulmonary opacities identified. No pleural effusion or pneumothorax visualized. No acute osseous abnormality appreciated. IMPRESSION: No acute intrathoracic process identified. Electronically Signed   By: Ofilia Neas M.D.   On: 02/22/2021 10:32    Procedures Procedures    Medications Ordered in ED Medications  ondansetron (ZOFRAN) injection 4 mg (4 mg Intravenous Given 02/22/21 1009)   sodium chloride 0.9 % bolus 1,000 mL (1,000 mLs Intravenous Bolus 02/22/21 1009)  morphine 4 MG/ML injection 4 mg (4 mg Intravenous Given 02/22/21 1009)  iohexol (OMNIPAQUE) 350 MG/ML injection 100 mL (75 mLs Intravenous Contrast Given 02/22/21 1104)  sodium chloride 0.9 % bolus 1,000 mL (1,000 mLs Intravenous Bolus 02/22/21 1227)  morphine 4 MG/ML injection 4 mg (4 mg Intravenous Given 02/22/21 1227)  lidocaine (XYLOCAINE) 2 % viscous mouth solution 15 mL (15 mLs Mouth/Throat Given 02/22/21 1352)  HYDROmorphone (DILAUDID) injection 1 mg (1 mg Intravenous Given 02/22/21 1352)   ED Course/ Medical Decision Making/ A&P    Pleasant 56 year old who unfortunately has adenocarcinoma of the lungs here for evaluation of worsening throat pain, chest pain and abdominal pain.  Had some chills yesterday, upper abdominal, lower chest pain  is worsening.  She denies any clinical evidence of VTE on exam however she is tachycardic.  Her posterior oropharynx is erythematous however no large ulcerations.  Trying meds at home prescribed by oncology without significant relief.  Has difficulty swallowing even water at this time due to pain.  We will plan on broad work-up given recently had chemo and radiation treatment.  We will obtain blood cultures, lactic, treat pain and reassess.  Labs and imaging personally reviewed and interpreted:  CBC with leukopenia at 1.9, hgb similar to prior CMP elevated bilil at 1.5, anion gap at 17 Lipase 39 Trop 4 EKG without STEMI, ischemia DG chest without acute findings COVID positive Flu neg UA dehydration, no infection Lactic 1.7 BC pending Reviewed Ct scan can, possible increase in pancreatic duct, unclear etiology  Patient reassessed. Continues to be tachycardic as well as uncontrolled pain, from her esophagus to her abdomen.  Question mucositis versus esophagitis.  CONSULT with Dr. Chryl Heck with Oncology, Rec admission for hydration, pain control.  Did discuss CT findings,  mildly elevated bilirubin, possible mucositis versus esophagitis as cause of pain.  Hold on ERCP, oncology will see patient tomorrow  Discussed admission with patient and husband in room.  They are agreeable.  CONSULT with Dr. Marylyn Ishihara with [facility] who is agreeable to evaluate patient for admission.  At this time I have will suspicion for sepsis.  Hold on antibiotics.  Patient will be admitted for further work-up and management  The patient appears reasonably stabilized for admission considering the current resources, flow, and capabilities available in the ED at this time, and I doubt any other [facility] requiring further screening and/or treatment in the ED prior to admission.                             Medical Decision Making Amount and/or Complexity of Data Reviewed Independent Historian: spouse External Data Reviewed: labs, radiology and notes. Labs: ordered. Decision-making details documented in ED Course. Radiology: ordered and independent interpretation performed. Decision-making details documented in ED Course. ECG/medicine tests: ordered and independent interpretation performed. Decision-making details documented in ED Course.  Risk OTC drugs. Prescription drug management. Parenteral controlled substances. Decision regarding hospitalization. Diagnosis or treatment significantly limited by social determinants of health. Risk Details: Oncology patient         Final Clinical Impression(s) / ED Diagnoses Final diagnoses:  Impaired swallowing associated with throat pain  Epigastric pain  Malignant neoplasm of lung, unspecified laterality, unspecified part of lung (HCC)  Radiation burn  Esophagitis  Elevated bilirubin  COVID  Leukopenia, unspecified type    Rx / DC Orders ED Discharge Orders     None         Magda Muise A, PA-C 02/22/21 1513    Milton Ferguson, MD 02/25/21 0840

## 2021-02-22 NOTE — ED Triage Notes (Signed)
Pt BIBA from home c/o dysphagia. Finished treatments on Monday, noticed esophageal pain and abdominal pain since then. Patient reports difficulty eating/drinking anything d/t pain. Hx lung cancer.  BP 118/80, P 116, RR 24, 99% RA

## 2021-02-23 ENCOUNTER — Ambulatory Visit: Payer: Commercial Managed Care - PPO

## 2021-02-23 ENCOUNTER — Ambulatory Visit: Payer: Commercial Managed Care - PPO | Admitting: Physician Assistant

## 2021-02-23 ENCOUNTER — Other Ambulatory Visit: Payer: Commercial Managed Care - PPO

## 2021-02-23 DIAGNOSIS — Z7982 Long term (current) use of aspirin: Secondary | ICD-10-CM | POA: Diagnosis not present

## 2021-02-23 DIAGNOSIS — K2100 Gastro-esophageal reflux disease with esophagitis, without bleeding: Secondary | ICD-10-CM | POA: Diagnosis not present

## 2021-02-23 DIAGNOSIS — E785 Hyperlipidemia, unspecified: Secondary | ICD-10-CM

## 2021-02-23 DIAGNOSIS — J309 Allergic rhinitis, unspecified: Secondary | ICD-10-CM | POA: Diagnosis present

## 2021-02-23 DIAGNOSIS — T451X5A Adverse effect of antineoplastic and immunosuppressive drugs, initial encounter: Secondary | ICD-10-CM

## 2021-02-23 DIAGNOSIS — K219 Gastro-esophageal reflux disease without esophagitis: Secondary | ICD-10-CM

## 2021-02-23 DIAGNOSIS — Z825 Family history of asthma and other chronic lower respiratory diseases: Secondary | ICD-10-CM | POA: Diagnosis not present

## 2021-02-23 DIAGNOSIS — E7800 Pure hypercholesterolemia, unspecified: Secondary | ICD-10-CM | POA: Diagnosis present

## 2021-02-23 DIAGNOSIS — H9191 Unspecified hearing loss, right ear: Secondary | ICD-10-CM | POA: Diagnosis present

## 2021-02-23 DIAGNOSIS — R1013 Epigastric pain: Secondary | ICD-10-CM | POA: Diagnosis present

## 2021-02-23 DIAGNOSIS — K208 Other esophagitis without bleeding: Secondary | ICD-10-CM | POA: Diagnosis present

## 2021-02-23 DIAGNOSIS — Y842 Radiological procedure and radiotherapy as the cause of abnormal reaction of the patient, or of later complication, without mention of misadventure at the time of the procedure: Secondary | ICD-10-CM | POA: Diagnosis present

## 2021-02-23 DIAGNOSIS — T2101XA Burn of unspecified degree of chest wall, initial encounter: Secondary | ICD-10-CM | POA: Diagnosis present

## 2021-02-23 DIAGNOSIS — D61810 Antineoplastic chemotherapy induced pancytopenia: Secondary | ICD-10-CM | POA: Diagnosis present

## 2021-02-23 DIAGNOSIS — Z9851 Tubal ligation status: Secondary | ICD-10-CM | POA: Diagnosis not present

## 2021-02-23 DIAGNOSIS — R1310 Dysphagia, unspecified: Secondary | ICD-10-CM | POA: Diagnosis present

## 2021-02-23 DIAGNOSIS — J342 Deviated nasal septum: Secondary | ICD-10-CM | POA: Diagnosis present

## 2021-02-23 DIAGNOSIS — Z90710 Acquired absence of both cervix and uterus: Secondary | ICD-10-CM | POA: Diagnosis not present

## 2021-02-23 DIAGNOSIS — Z888 Allergy status to other drugs, medicaments and biological substances status: Secondary | ICD-10-CM | POA: Diagnosis not present

## 2021-02-23 DIAGNOSIS — C3411 Malignant neoplasm of upper lobe, right bronchus or lung: Secondary | ICD-10-CM

## 2021-02-23 DIAGNOSIS — J432 Centrilobular emphysema: Secondary | ICD-10-CM | POA: Diagnosis present

## 2021-02-23 DIAGNOSIS — R7989 Other specified abnormal findings of blood chemistry: Secondary | ICD-10-CM | POA: Diagnosis not present

## 2021-02-23 DIAGNOSIS — G43909 Migraine, unspecified, not intractable, without status migrainosus: Secondary | ICD-10-CM | POA: Diagnosis present

## 2021-02-23 DIAGNOSIS — Z9049 Acquired absence of other specified parts of digestive tract: Secondary | ICD-10-CM | POA: Diagnosis not present

## 2021-02-23 DIAGNOSIS — Z79899 Other long term (current) drug therapy: Secondary | ICD-10-CM | POA: Diagnosis not present

## 2021-02-23 DIAGNOSIS — T66XXXA Radiation sickness, unspecified, initial encounter: Secondary | ICD-10-CM | POA: Diagnosis not present

## 2021-02-23 DIAGNOSIS — B001 Herpesviral vesicular dermatitis: Secondary | ICD-10-CM | POA: Diagnosis present

## 2021-02-23 DIAGNOSIS — R17 Unspecified jaundice: Secondary | ICD-10-CM | POA: Diagnosis present

## 2021-02-23 DIAGNOSIS — U071 COVID-19: Secondary | ICD-10-CM | POA: Diagnosis present

## 2021-02-23 DIAGNOSIS — Z87891 Personal history of nicotine dependence: Secondary | ICD-10-CM | POA: Diagnosis not present

## 2021-02-23 HISTORY — DX: Antineoplastic chemotherapy induced pancytopenia: T45.1X5A

## 2021-02-23 HISTORY — DX: Hyperlipidemia, unspecified: E78.5

## 2021-02-23 LAB — COMPREHENSIVE METABOLIC PANEL
ALT: 54 U/L — ABNORMAL HIGH (ref 0–44)
AST: 51 U/L — ABNORMAL HIGH (ref 15–41)
Albumin: 2.9 g/dL — ABNORMAL LOW (ref 3.5–5.0)
Alkaline Phosphatase: 138 U/L — ABNORMAL HIGH (ref 38–126)
Anion gap: 9 (ref 5–15)
BUN: 9 mg/dL (ref 6–20)
CO2: 20 mmol/L — ABNORMAL LOW (ref 22–32)
Calcium: 8 mg/dL — ABNORMAL LOW (ref 8.9–10.3)
Chloride: 107 mmol/L (ref 98–111)
Creatinine, Ser: 0.5 mg/dL (ref 0.44–1.00)
GFR, Estimated: >60 mL/min (ref >60)
Glucose, Bld: 68 mg/dL — ABNORMAL LOW (ref 70–99)
Potassium: 3.5 mmol/L (ref 3.5–5.1)
Sodium: 136 mmol/L (ref 135–145)
Total Bilirubin: 1.3 mg/dL — ABNORMAL HIGH (ref 0.3–1.2)
Total Protein: 5.8 g/dL — ABNORMAL LOW (ref 6.5–8.1)

## 2021-02-23 LAB — CBC
HCT: 23.1 % — ABNORMAL LOW (ref 36.0–46.0)
Hemoglobin: 8.1 g/dL — ABNORMAL LOW (ref 12.0–15.0)
MCH: 34.5 pg — ABNORMAL HIGH (ref 26.0–34.0)
MCHC: 35.1 g/dL (ref 30.0–36.0)
MCV: 98.3 fL (ref 80.0–100.0)
Platelets: 146 10*3/uL — ABNORMAL LOW (ref 150–400)
RBC: 2.35 MIL/uL — ABNORMAL LOW (ref 3.87–5.11)
RDW: 15 % (ref 11.5–15.5)
WBC: 1.5 10*3/uL — ABNORMAL LOW (ref 4.0–10.5)
nRBC: 0 % (ref 0.0–0.2)

## 2021-02-23 LAB — HIV ANTIBODY (ROUTINE TESTING W REFLEX): HIV Screen 4th Generation wRfx: NONREACTIVE

## 2021-02-23 MED ORDER — ATORVASTATIN CALCIUM 40 MG PO TABS: 40.0000 mg | ORAL_TABLET | Freq: Every day | ORAL | Status: DC

## 2021-02-23 MED ORDER — BENZONATATE 100 MG PO CAPS
200.0000 mg | ORAL_CAPSULE | Freq: Three times a day (TID) | ORAL | Status: DC | PRN
  Administered 2021-02-23 – 2021-02-27 (×10): 200 mg via ORAL
  Filled 2021-02-23 (×10): qty 2

## 2021-02-23 MED ORDER — FLUTICASONE PROPIONATE 50 MCG/ACT NA SUSP
2.0000 | Freq: Every day | NASAL | Status: DC | PRN
  Administered 2021-02-24 (×2): 2 via NASAL
  Filled 2021-02-23 (×3): qty 16

## 2021-02-23 MED ORDER — BUPROPION HCL ER (SR) 150 MG PO TB12
150.0000 mg | ORAL_TABLET | Freq: Two times a day (BID) | ORAL | Status: DC
  Administered 2021-02-23 – 2021-02-27 (×7): 150 mg via ORAL
  Filled 2021-02-23 (×7): qty 1

## 2021-02-23 MED ORDER — BUPROPION HCL ER (SR) 150 MG PO TB12: 150.0000 mg | ORAL_TABLET | Freq: Two times a day (BID) | ORAL | Status: DC

## 2021-02-23 MED ORDER — DM-GUAIFENESIN ER 30-600 MG PO TB12: 1.0000 | ORAL_TABLET | Freq: Two times a day (BID) | ORAL | Status: DC | PRN

## 2021-02-23 MED ORDER — ENOXAPARIN SODIUM 40 MG/0.4ML IJ SOSY
40.0000 mg | PREFILLED_SYRINGE | INTRAMUSCULAR | Status: DC
  Administered 2021-02-23 – 2021-02-25 (×3): 40 mg via SUBCUTANEOUS
  Filled 2021-02-23 (×4): qty 0.4

## 2021-02-23 MED ORDER — NIRMATRELVIR/RITONAVIR (PAXLOVID)TABLET
3.0000 | ORAL_TABLET | Freq: Two times a day (BID) | ORAL | Status: DC
  Administered 2021-02-23 – 2021-02-25 (×5): 3 via ORAL
  Filled 2021-02-23: qty 30

## 2021-02-23 NOTE — Assessment & Plan Note (Addendum)
Still with some improvement but did not tolerate some of her clear liquid diet secondary to choices -Advance to full liquid diet -Continue IV fluids; discontinue once oral intake picks up

## 2021-02-23 NOTE — Hospital Course (Signed)
Julie Nicholson is a 56 y.o. female with a history of NSCLC recently on chemotherapy and radiation, hyperlipidemia, COPD, GERD. 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 NPO on admission with plan to advance diet slowly. Supportive care.

## 2021-02-23 NOTE — Assessment & Plan Note (Addendum)
WBC of 1.9 on admission with an Yeoman of 1400. Hemoglobin 10.6 > 8.1 > 7 after IV fluids. Platelets mildly decreased. Afebrile. No evidence of bleeding; no hemoptysis or melena. -H&H this afternoon -CBC in AM

## 2021-02-23 NOTE — Assessment & Plan Note (Signed)
Patient follows with Dr. Julien Nordmann as an outpatient. Patient has received chemotherapy/radiation therapy.

## 2021-02-23 NOTE — Assessment & Plan Note (Signed)
Mild. In setting of COVID-19 infection.

## 2021-02-23 NOTE — Progress Notes (Signed)
PROGRESS NOTE    Julie Nicholson  WIO:[id number] DOB: 1965-12-08 DOA: 02/22/2021 PCP: [name], NP   Brief Narrative: Julie Nicholson is a 56 y.o. female with a history of NSCLC recently on chemotherapy and radiation, hyperlipidemia, COPD, GERD. 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 NPO on admission with plan to advance diet slowly. Supportive care.   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 Julien Nordmann as an outpatient. Patient has received chemotherapy/radiation therapy.   Elevated LFTs Mild. In setting of COVID-19 infection.  Antineoplastic chemotherapy induced pancytopenia (HCC) WBC of 1.9 on admission with an [place] of 1400. Hemoglobin 10.6 > 8.1 after IV fluids. Platelets mildly decreased. Afebrile. -CBC in AM  Hyperlipidemia Patient is on Lipitor as an outpatient which was held secondary to NPO status. Will continue to hold while in Remdesivir with elevated LFTs.  GERD (gastroesophageal reflux disease)- (present on admission) -Continue Protonix -Continue Carafate     DVT prophylaxis: Lovenox Code Status:   Code Status: Full Code Family Communication: None at bedside Disposition Plan: Discharge home likely in 1-3 days pending ability to tolerate diet   Consultants:  Medical oncology  Procedures:  None  Antimicrobials: Remdesivir    Subjective: Esophageal pain is a bit better today. Wanting to try a more advanced diet  Objective: Vitals:   02/23/21 0600 02/23/21 0649 02/23/21 0909 02/23/21 1345  BP: 120/67  (!) 117/58 116/74  Pulse: (!) 106  (!) 107 (!) 102  Resp: 16  16 16   Temp: 97.9 F (36.6 C)  (!) 97.5 F (36.4 C) (!) 97.4 F (36.3 C)  TempSrc:   Oral Oral  SpO2: 96%  96% 98%   Weight:  48 kg    Height:  5\' 1"  (1.549 m)      Intake/Output Summary (Last 24 hours) at 02/23/2021 1604 Last data filed at 02/23/2021 1400 Gross per 24 hour  Intake 1966.41 ml  Output --  Net 1966.41 ml   Filed Weights   02/23/21 0649  Weight: 48 kg    Examination:  General exam: Appears calm and comfortable Respiratory system: Clear to auscultation. Respiratory effort normal. Cardiovascular system: S1 & S2 heard, RRR. Gastrointestinal system: Abdomen is nondistended, soft and nontender. No organomegaly or masses felt. Normal bowel sounds heard. Central nervous system: Alert and oriented. No focal neurological deficits. Musculoskeletal: No edema. No calf tenderness Skin: No cyanosis. No rashes Psychiatry: Judgement and insight appear normal. Mood & affect appropriate.     Data Reviewed: I have personally reviewed following labs and imaging studies  CBC Lab Results  Component Value Date   WBC 1.5 (L) 02/23/2021   RBC 2.35 (L) 02/23/2021   HGB 8.1 (L) 02/23/2021   HCT 23.1 (L) 02/23/2021   MCV 98.3 02/23/2021   MCH 34.5 (H) 02/23/2021   PLT 146 (L) 02/23/2021   MCHC 35.1 02/23/2021   RDW 15.0 02/23/2021   LYMPHSABS 0.3 (L) 02/22/2021   MONOABS 0.2 02/22/2021   EOSABS 0.0 02/22/2021   BASOSABS 0.0 38/45/3646     Last metabolic panel Lab Results  Component Value Date   NA 136 02/23/2021   K 3.5 02/23/2021   CL 107 02/23/2021   CO2 20 (L) 02/23/2021   BUN 9 02/23/2021   CREATININE 0.50 02/23/2021   GLUCOSE 68 (L)  02/23/2021   GFRNONAA >60 02/23/2021   GFRAA >90 04/03/2013   CALCIUM 8.0 (L) 02/23/2021   PROT 5.8 (L) 02/23/2021   ALBUMIN 2.9 (L) 02/23/2021   BILITOT 1.3 (H) 02/23/2021   ALKPHOS 138 (H) 02/23/2021   AST 51 (H) 02/23/2021   ALT 54 (H) 02/23/2021   ANIONGAP 9 02/23/2021    CBG (last 3)  No results for input(s): GLUCAP in the last 72 hours.   GFR: Estimated Creatinine Clearance: 58.5 mL/min (by C-G formula based on SCr of 0.5  mg/dL).  Coagulation Profile: No results for input(s): INR, PROTIME in the last 168 hours.  Recent Results (from the past 240 hour(s))  Group A Strep by PCR     Status: None   Collection Time: 02/22/21  9:45 AM   Specimen: Sterile Swab  Result Value Ref Range Status   Group A Strep by PCR NOT DETECTED NOT DETECTED Final    Comment: Performed at [facility], [place] [address], [place], [place] [address]  Blood culture (routine x 2)     Status: None (Preliminary result)   Collection Time: 02/22/21 10:02 AM   Specimen: BLOOD  Result Value Ref Range Status   Specimen Description   Final    BLOOD RIGHT ANTECUBITAL Performed at [facility] [address], [place], [place] [address]    Special Requests   Final    BOTTLES DRAWN AEROBIC AND ANAEROBIC Blood Culture adequate volume Performed at [employer] [address] [place], [place] [address]    Culture   Final    NO GROWTH < 24 HOURS Performed at [facility] [address] [place], [place] [address]    Report Status PENDING  Incomplete  Blood culture (routine x 2)     Status: None (Preliminary result)   Collection Time: 02/22/21 10:02 AM   Specimen: BLOOD  Result Value Ref Range Status   Specimen Description   Final    BLOOD LEFT ANTECUBITAL Performed at [facility] [address], [place], [place] [address]    Special Requests   Final    BOTTLES DRAWN AEROBIC AND ANAEROBIC Blood Culture results may not be optimal due to an excessive volume of blood received in culture bottles Performed at [facility] [address], [place], [place] [address]    Culture   Final    NO GROWTH < 24 HOURS Performed at [facility] [address], [place], [place] [address]    Report Status PENDING  Incomplete  Resp Panel by RT-PCR (Flu A&B, Covid) Nasopharyngeal Swab     Status: Abnormal   Collection Time: 02/22/21 11:57 AM    Specimen: Nasopharyngeal Swab; Nasopharyngeal(NP) swabs in vial transport medium  Result Value Ref Range Status   SARS Coronavirus 2 by RT PCR POSITIVE (A) NEGATIVE Final    Comment: (NOTE) SARS-CoV-2 target nucleic acids are DETECTED.  The SARS-CoV-2 RNA is generally detectable in upper respiratory specimens during the acute phase of infection. Positive results are indicative of the presence of the identified virus, but do not rule out bacterial infection or co-infection with other pathogens not detected by the test. Clinical correlation with patient history and other diagnostic information is necessary to determine patient infection status. The expected result is Negative.  Fact Sheet for Patients: EntrepreneurPulse.com.au  Fact Sheet for Healthcare Providers: IncredibleEmployment.be  This test is not yet approved or cleared by the [place] and  has been authorized for detection and/or diagnosis of SARS-CoV-2 by FDA under an Emergency Use Authorization (EUA).  This EUA will remain in effect (meaning this test can be used) for the duration of  the COVID-19 declaration under Section 564(b)(1) of the A ct, 21 U.S.C. section 360bbb-3(b)(1), unless the authorization is terminated or revoked sooner.     Influenza A by PCR NEGATIVE NEGATIVE Final   Influenza B by PCR NEGATIVE NEGATIVE Final    Comment: (NOTE) The Xpert Xpress SARS-CoV-2/FLU/RSV plus assay is intended as an aid in the diagnosis of influenza from Nasopharyngeal swab specimens and should not be used as a sole basis for treatment. Nasal washings and aspirates are unacceptable for Xpert Xpress SARS-CoV-2/FLU/RSV testing.  Fact Sheet for Patients: EntrepreneurPulse.com.au  Fact Sheet for Healthcare Providers: IncredibleEmployment.be  This test is not yet approved or cleared by the [place] FDA and has been authorized for detection  and/or diagnosis of SARS-CoV-2 by FDA under an Emergency Use Authorization (EUA). This EUA will remain in effect (meaning this test can be used) for the duration of the COVID-19 declaration under Section 564(b)(1) of the Act, 21 U.S.C. section 360bbb-3(b)(1), unless the authorization is terminated or revoked.  Performed at [facility], [place] [address] [place], [place] [address]         Radiology Studies: CT Angio Chest PE W and/or Wo Contrast  Result Date: 02/22/2021 CLINICAL DATA:  Shortness of breath and chest pain. Clinical concern for pulmonary embolus. History of lung cancer with abdominal pain. Elevated bilirubin and diarrhea. EXAM: CT ANGIOGRAPHY CHEST CT ABDOMEN AND PELVIS WITH CONTRAST TECHNIQUE: Multidetector CT imaging of the chest was performed using the standard protocol during bolus administration of intravenous contrast. Multiplanar CT image reconstructions and MIPs were obtained to evaluate the vascular anatomy. Multidetector CT imaging of the abdomen and pelvis was performed using the standard protocol during bolus administration of intravenous contrast. RADIATION DOSE REDUCTION: This exam was performed according to the departmental dose-optimization program which includes automated exposure control, adjustment of the mA and/or kV according to patient size and/or use of iterative reconstruction technique. CONTRAST:  76mL OMNIPAQUE IOHEXOL 350 MG/ML SOLN COMPARISON:  PET-CT 12/12/2020.  CTA chest 11/27/2020. FINDINGS: CTA CHEST FINDINGS Cardiovascular: The heart size is normal. No substantial pericardial effusion. No thoracic aortic aneurysm. There is no filling defect within the opacified pulmonary arteries to suggest the presence of an acute pulmonary embolus. Mediastinum/Nodes: Bulky right hilar lymphadenopathy seen on the previous study has decreased with right hilar lymph node measuring 14 mm short axis today compared to 24 mm previously. Subcarinal  lymphadenopathy has also decreased measuring approximate 11 mm today compared to 17 mm previously. No left hilar lymphadenopathy. Possible mild wall thickening in the mid esophagus (image 38/2). There is no axillary lymphadenopathy. Lungs/Pleura: Centrilobular and paraseptal emphysema evident. Right upper lobe pulmonary nodule has decreased in the interval measuring 9 x 7 mm today on 52/7, decreased from 12 x 11 mm (remeasured) previously. No new suspicious nodule or mass. No focal airspace consolidation. No pleural effusion. Musculoskeletal: No worrisome lytic or sclerotic osseous abnormality. Review of the MIP images confirms the above findings. CT ABDOMEN and PELVIS FINDINGS Hepatobiliary: No suspicious focal abnormality within the liver parenchyma. Gallbladder surgically absent. Common bile duct measures 8 mm diameter in the head of the pancreas increased from 5 mm previously. Pancreas: Prominence of the main pancreatic duct in the head of pancreas is similar to prior with main pancreatic duct measuring up to 4  mm diameter. Spleen: No splenomegaly. No focal mass lesion. Adrenals/Urinary Tract: No adrenal nodule or mass. 10 mm subcapsular lesion interpolar right kidney has attenuation too high to be a simple cyst. 13 mm hypoattenuating lesion upper interpolar left kidney is likely a cyst. No evidence for hydroureter. The urinary bladder appears normal for the degree of distention. Stomach/Bowel: Stomach is unremarkable. No gastric wall thickening. No evidence of outlet obstruction. Duodenum is normally positioned as is the ligament of Treitz. No small bowel wall thickening. No small bowel dilatation. The terminal ileum is normal. The appendix is not well visualized, but there is no edema or inflammation in the region of the cecum. No gross colonic mass. No colonic wall thickening. Diverticular changes are noted in the left colon without evidence of diverticulitis. Vascular/Lymphatic: There is mild atherosclerotic  calcification of the abdominal aorta without aneurysm. There is no gastrohepatic or hepatoduodenal ligament lymphadenopathy. No retroperitoneal or mesenteric lymphadenopathy. No pelvic sidewall lymphadenopathy. Reproductive: Unremarkable. Other: No intraperitoneal free fluid. Musculoskeletal: No worrisome lytic or sclerotic osseous abnormality. Review of the MIP images confirms the above findings. IMPRESSION: 1. No evidence for acute pulmonary embolus. 2. Interval decrease in size of the right upper lobe pulmonary nodule and bulky right hilar and subcarinal lymphadenopathy. 3. No evidence for metastatic disease in the abdomen or pelvis. 4. 10 mm subcapsular lesion interpolar right kidney has attenuation too high to be a simple cyst. While this may represent a cyst complicated by proteinaceous debris or hemorrhage, neoplasm could have a similar appearance. MRI of the abdomen with and without contrast recommended to further evaluate. 5. Prominence of the main pancreatic duct in the head of pancreas is similar to prior but slightly progressive. While likely related to prior surgery, given the reported history of elevated bilirubin, MRCP or ERCP may be warranted to further evaluate. 6. Possible mild wall thickening in the mid esophagus. Esophagitis could have this appearance. 7. Aortic Atherosclerosis (ICD10-I70.0) and Emphysema (ICD10-J43.9). Electronically Signed   By: Misty Stanley M.D.   On: 02/22/2021 12:14   CT ABDOMEN PELVIS W CONTRAST  Result Date: 02/22/2021 CLINICAL DATA:  Shortness of breath and chest pain. Clinical concern for pulmonary embolus. History of lung cancer with abdominal pain. Elevated bilirubin and diarrhea. EXAM: CT ANGIOGRAPHY CHEST CT ABDOMEN AND PELVIS WITH CONTRAST TECHNIQUE: Multidetector CT imaging of the chest was performed using the standard protocol during bolus administration of intravenous contrast. Multiplanar CT image reconstructions and MIPs were obtained to evaluate the  vascular anatomy. Multidetector CT imaging of the abdomen and pelvis was performed using the standard protocol during bolus administration of intravenous contrast. RADIATION DOSE REDUCTION: This exam was performed according to the departmental dose-optimization program which includes automated exposure control, adjustment of the mA and/or kV according to patient size and/or use of iterative reconstruction technique. CONTRAST:  48mL OMNIPAQUE IOHEXOL 350 MG/ML SOLN COMPARISON:  PET-CT 12/12/2020.  CTA chest 11/27/2020. FINDINGS: CTA CHEST FINDINGS Cardiovascular: The heart size is normal. No substantial pericardial effusion. No thoracic aortic aneurysm. There is no filling defect within the opacified pulmonary arteries to suggest the presence of an acute pulmonary embolus. Mediastinum/Nodes: Bulky right hilar lymphadenopathy seen on the previous study has decreased with right hilar lymph node measuring 14 mm short axis today compared to 24 mm previously. Subcarinal lymphadenopathy has also decreased measuring approximate 11 mm today compared to 17 mm previously. No left hilar lymphadenopathy. Possible mild wall thickening in the mid esophagus (image 38/2). There is no axillary lymphadenopathy. Lungs/Pleura: Centrilobular and  paraseptal emphysema evident. Right upper lobe pulmonary nodule has decreased in the interval measuring 9 x 7 mm today on 52/7, decreased from 12 x 11 mm (remeasured) previously. No new suspicious nodule or mass. No focal airspace consolidation. No pleural effusion. Musculoskeletal: No worrisome lytic or sclerotic osseous abnormality. Review of the MIP images confirms the above findings. CT ABDOMEN and PELVIS FINDINGS Hepatobiliary: No suspicious focal abnormality within the liver parenchyma. Gallbladder surgically absent. Common bile duct measures 8 mm diameter in the head of the pancreas increased from 5 mm previously. Pancreas: Prominence of the main pancreatic duct in the head of pancreas is  similar to prior with main pancreatic duct measuring up to 4 mm diameter. Spleen: No splenomegaly. No focal mass lesion. Adrenals/Urinary Tract: No adrenal nodule or mass. 10 mm subcapsular lesion interpolar right kidney has attenuation too high to be a simple cyst. 13 mm hypoattenuating lesion upper interpolar left kidney is likely a cyst. No evidence for hydroureter. The urinary bladder appears normal for the degree of distention. Stomach/Bowel: Stomach is unremarkable. No gastric wall thickening. No evidence of outlet obstruction. Duodenum is normally positioned as is the ligament of Treitz. No small bowel wall thickening. No small bowel dilatation. The terminal ileum is normal. The appendix is not well visualized, but there is no edema or inflammation in the region of the cecum. No gross colonic mass. No colonic wall thickening. Diverticular changes are noted in the left colon without evidence of diverticulitis. Vascular/Lymphatic: There is mild atherosclerotic calcification of the abdominal aorta without aneurysm. There is no gastrohepatic or hepatoduodenal ligament lymphadenopathy. No retroperitoneal or mesenteric lymphadenopathy. No pelvic sidewall lymphadenopathy. Reproductive: Unremarkable. Other: No intraperitoneal free fluid. Musculoskeletal: No worrisome lytic or sclerotic osseous abnormality. Review of the MIP images confirms the above findings. IMPRESSION: 1. No evidence for acute pulmonary embolus. 2. Interval decrease in size of the right upper lobe pulmonary nodule and bulky right hilar and subcarinal lymphadenopathy. 3. No evidence for metastatic disease in the abdomen or pelvis. 4. 10 mm subcapsular lesion interpolar right kidney has attenuation too high to be a simple cyst. While this may represent a cyst complicated by proteinaceous debris or hemorrhage, neoplasm could have a similar appearance. MRI of the abdomen with and without contrast recommended to further evaluate. 5. Prominence of the  main pancreatic duct in the head of pancreas is similar to prior but slightly progressive. While likely related to prior surgery, given the reported history of elevated bilirubin, MRCP or ERCP may be warranted to further evaluate. 6. Possible mild wall thickening in the mid esophagus. Esophagitis could have this appearance. 7. Aortic Atherosclerosis (ICD10-I70.0) and Emphysema (ICD10-J43.9). Electronically Signed   By: Misty Stanley M.D.   On: 02/22/2021 12:14   DG Chest Portable 1 View  Result Date: 02/22/2021 CLINICAL DATA:  Shortness of breath EXAM: PORTABLE CHEST 1 VIEW COMPARISON:  Chest x-ray 12/15/2020 FINDINGS: Heart size and mediastinal contours are within normal limits. No suspicious pulmonary opacities identified. No pleural effusion or pneumothorax visualized. No acute osseous abnormality appreciated. IMPRESSION: No acute intrathoracic process identified. Electronically Signed   By: Ofilia Neas M.D.   On: 02/22/2021 10:32        Scheduled Meds:  atorvastatin  40 mg Oral QHS   buPROPion  150 mg Oral BID   pantoprazole (PROTONIX) IV  40 mg Intravenous Q12H   sucralfate  1 g Oral TID WC & HS   Continuous Infusions:  sodium chloride 100 mL/hr at 02/23/21 1208  LOS: 0 days     Cordelia Poche, MD [facility] 02/23/2021, 4:04 PM  If 7PM-7AM, please contact night-coverage [email or web address]

## 2021-02-23 NOTE — Assessment & Plan Note (Addendum)
Patient is on Lipitor as an outpatient which was held secondary to NPO status. Will continue to hold while in Remdesivir with elevated LFTs.

## 2021-02-23 NOTE — Assessment & Plan Note (Addendum)
-  Continue Protonix -Continue Carafate

## 2021-02-23 NOTE — Progress Notes (Signed)
Transition of Care [facility]) Screening Note  Patient Details  Name: DANAY MCKELLAR Date of Birth: 11-18-1965  Transition of Care [facility]) CM/SW Contact:    Sherie Don, LCSW Phone Number: 02/23/2021, 10:42 AM  Transition of Care Department [facility]) has reviewed patient and no TOC needs have been identified at this time. We will continue to monitor patient advancement through interdisciplinary progression rounds. If new patient transition needs arise, please place a TOC consult.

## 2021-02-24 ENCOUNTER — Ambulatory Visit: Payer: Commercial Managed Care - PPO

## 2021-02-24 ENCOUNTER — Other Ambulatory Visit: Payer: Commercial Managed Care - PPO

## 2021-02-24 ENCOUNTER — Ambulatory Visit: Payer: Commercial Managed Care - PPO | Admitting: Internal Medicine

## 2021-02-24 DIAGNOSIS — U071 COVID-19: Secondary | ICD-10-CM

## 2021-02-24 HISTORY — DX: COVID-19: U07.1

## 2021-02-24 LAB — CBC WITH DIFFERENTIAL/PLATELET
Abs Immature Granulocytes: 0.01 10*3/uL (ref 0.00–0.07)
Basophils Absolute: 0 10*3/uL (ref 0.0–0.1)
Basophils Relative: 0 %
Eosinophils Absolute: 0 10*3/uL (ref 0.0–0.5)
Eosinophils Relative: 1 %
HCT: 19.6 % — ABNORMAL LOW (ref 36.0–46.0)
Hemoglobin: 7 g/dL — ABNORMAL LOW (ref 12.0–15.0)
Immature Granulocytes: 1 %
Lymphocytes Relative: 19 %
Lymphs Abs: 0.3 10*3/uL — ABNORMAL LOW (ref 0.7–4.0)
MCH: 34.7 pg — ABNORMAL HIGH (ref 26.0–34.0)
MCHC: 35.7 g/dL (ref 30.0–36.0)
MCV: 97 fL (ref 80.0–100.0)
Monocytes Absolute: 0.2 10*3/uL (ref 0.1–1.0)
Monocytes Relative: 14 %
Neutro Abs: 1.1 10*3/uL — ABNORMAL LOW (ref 1.7–7.7)
Neutrophils Relative %: 65 %
Platelets: 148 10*3/uL — ABNORMAL LOW (ref 150–400)
RBC: 2.02 MIL/uL — ABNORMAL LOW (ref 3.87–5.11)
RDW: 15.6 % — ABNORMAL HIGH (ref 11.5–15.5)
WBC: 1.6 10*3/uL — ABNORMAL LOW (ref 4.0–10.5)
nRBC: 0 % (ref 0.0–0.2)

## 2021-02-24 LAB — COMPREHENSIVE METABOLIC PANEL
ALT: 38 U/L (ref 0–44)
AST: 29 U/L (ref 15–41)
Albumin: 3 g/dL — ABNORMAL LOW (ref 3.5–5.0)
Alkaline Phosphatase: 116 U/L (ref 38–126)
Anion gap: 9 (ref 5–15)
BUN: 6 mg/dL (ref 6–20)
CO2: 22 mmol/L (ref 22–32)
Calcium: 8 mg/dL — ABNORMAL LOW (ref 8.9–10.3)
Chloride: 105 mmol/L (ref 98–111)
Creatinine, Ser: 0.55 mg/dL (ref 0.44–1.00)
GFR, Estimated: >60 mL/min (ref >60)
Glucose, Bld: 63 mg/dL — ABNORMAL LOW (ref 70–99)
Potassium: 3.1 mmol/L — ABNORMAL LOW (ref 3.5–5.1)
Sodium: 136 mmol/L (ref 135–145)
Total Bilirubin: 1.1 mg/dL (ref 0.3–1.2)
Total Protein: 6 g/dL — ABNORMAL LOW (ref 6.5–8.1)

## 2021-02-24 LAB — MAGNESIUM: Magnesium: 1.4 mg/dL — ABNORMAL LOW (ref 1.7–2.4)

## 2021-02-24 LAB — HEMOGLOBIN AND HEMATOCRIT, BLOOD
HCT: 23.4 % — ABNORMAL LOW (ref 36.0–46.0)
Hemoglobin: 8.6 g/dL — ABNORMAL LOW (ref 12.0–15.0)

## 2021-02-24 LAB — C-REACTIVE PROTEIN: CRP: 1.6 mg/dL — ABNORMAL HIGH (ref <1.0)

## 2021-02-24 MED ORDER — OXYCODONE HCL 5 MG PO TABS
10.0000 mg | ORAL_TABLET | ORAL | Status: DC | PRN
  Administered 2021-02-24 – 2021-02-27 (×11): 10 mg via ORAL
  Filled 2021-02-24 (×10): qty 2

## 2021-02-24 MED ORDER — OXYCODONE HCL 5 MG PO TABS
10.0000 mg | ORAL_TABLET | Freq: Four times a day (QID) | ORAL | Status: DC | PRN
  Administered 2021-02-24: 09:00:00 10 mg via ORAL
  Filled 2021-02-24 (×2): qty 2

## 2021-02-24 MED ORDER — POTASSIUM CHLORIDE CRYS ER 20 MEQ PO TBCR
40.0000 meq | EXTENDED_RELEASE_TABLET | Freq: Once | ORAL | Status: AC
  Administered 2021-02-24: 09:00:00 40 meq via ORAL
  Filled 2021-02-24: qty 2

## 2021-02-24 NOTE — Assessment & Plan Note (Signed)
No associated hypoxia or pneumonia. -Paxlovid

## 2021-02-24 NOTE — Progress Notes (Signed)
PROGRESS NOTE    Julie Nicholson  QMV:[id number] DOB: 01-16-1966 DOA: 02/22/2021 PCP: [name], NP   Brief Narrative: Julie Nicholson is a 56 y.o. female with a history of NSCLC recently on chemotherapy and radiation, hyperlipidemia, COPD, GERD. 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 NPO on admission with plan to advance diet slowly. Supportive care.   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 Julien Nordmann as an outpatient. Patient has received chemotherapy/radiation therapy.   COVID-19 virus infection- (present on admission) No associated hypoxia or pneumonia. -Paxlovid  Antineoplastic chemotherapy induced pancytopenia (HCC) WBC of 1.9 on admission with an [place] of 1400. Hemoglobin 10.6 > 8.1 > 7 after IV fluids. Platelets mildly decreased. Afebrile. No evidence of bleeding; no hemoptysis or melena. -H&H this afternoon -CBC in AM  Hyperlipidemia Patient is on Lipitor as an outpatient which was held secondary to NPO status. Will continue to hold while in Remdesivir with elevated LFTs.  GERD (gastroesophageal reflux disease)- (present on admission) -Continue Protonix -Continue Carafate  Elevated LFTs-resolved as of 02/24/2021 Mild. In setting of COVID-19 infection.     DVT prophylaxis: Lovenox Code Status:   Code Status: Full Code Family Communication: None at bedside Disposition Plan: Discharge home likely in 1-2 days pending ability to tolerate diet   Consultants:  Medical oncology  Procedures:  None  Antimicrobials: Remdesivir    Subjective: Pain is still present in epigastric area but  overall there is some small improvement. Headache today.  Objective: Vitals:   02/23/21 0909 02/23/21 1345 02/23/21 2112 02/24/21 0548  BP: (!) 117/58 116/74 135/82 (!) 129/54  Pulse: (!) 107 (!) 102 (!) 106 (!) 105  Resp: 16 16 15 14   Temp: (!) 97.5 F (36.4 C) (!) 97.4 F (36.3 C)  97.8 F (36.6 C)  TempSrc: Oral Oral  Oral  SpO2: 96% 98% 97% 96%  Weight:      Height:        Intake/Output Summary (Last 24 hours) at 02/24/2021 0921 Last data filed at 02/24/2021 0600 Gross per 24 hour  Intake 1681.53 ml  Output --  Net 1681.53 ml    Filed Weights   02/23/21 0649  Weight: 48 kg    Examination:  General exam: Appears calm and comfortable Respiratory system: Clear to auscultation. Respiratory effort normal. Cardiovascular system: S1 & S2 heard, RRR. No murmurs, rubs, gallops or clicks. Gastrointestinal system: Abdomen is nondistended, soft and nontender. No organomegaly or masses felt. Normal bowel sounds heard. Central nervous system: Alert and oriented. No focal neurological deficits. Musculoskeletal: No edema. No calf tenderness Skin: No cyanosis. No rashes Psychiatry: Judgement and insight appear normal. Mood & affect appropriate.     Data Reviewed: I have personally reviewed following labs and imaging studies  CBC Lab Results  Component Value Date   WBC 1.6 (L) 02/24/2021   RBC 2.02 (L) 02/24/2021   HGB 7.0 (L) 02/24/2021   HCT 19.6 (L) 02/24/2021   MCV 97.0 02/24/2021   MCH 34.7 (H) 02/24/2021   PLT 148 (L) 02/24/2021   MCHC 35.7 02/24/2021   RDW 15.6 (H) 02/24/2021   LYMPHSABS 0.3 (L) 02/24/2021   MONOABS 0.2 02/24/2021  EOSABS 0.0 02/24/2021   BASOSABS 0.0 16/11/9602     Last metabolic panel Lab Results  Component Value Date   NA 136 02/24/2021   K 3.1 (L) 02/24/2021   CL 105 02/24/2021   CO2 22 02/24/2021   BUN 6 02/24/2021   CREATININE 0.55 02/24/2021   GLUCOSE 63 (L) 02/24/2021   GFRNONAA >60 02/24/2021   GFRAA >90 04/03/2013    CALCIUM 8.0 (L) 02/24/2021   PROT 6.0 (L) 02/24/2021   ALBUMIN 3.0 (L) 02/24/2021   BILITOT 1.1 02/24/2021   ALKPHOS 116 02/24/2021   AST 29 02/24/2021   ALT 38 02/24/2021   ANIONGAP 9 02/24/2021    CBG (last 3)  No results for input(s): GLUCAP in the last 72 hours.   GFR: Estimated Creatinine Clearance: 58.5 mL/min (by C-G formula based on SCr of 0.55 mg/dL).  Coagulation Profile: No results for input(s): INR, PROTIME in the last 168 hours.  Recent Results (from the past 240 hour(s))  Group A Strep by PCR     Status: None   Collection Time: 02/22/21  9:45 AM   Specimen: Sterile Swab  Result Value Ref Range Status   Group A Strep by PCR NOT DETECTED NOT DETECTED Final    Comment: Performed at [facility], [place] [address] [place], [place] [address]  Blood culture (routine x 2)     Status: None (Preliminary result)   Collection Time: 02/22/21 10:02 AM   Specimen: BLOOD  Result Value Ref Range Status   Specimen Description   Final    BLOOD RIGHT ANTECUBITAL Performed at [facility] [address], [place], [place] [address]    Special Requests   Final    BOTTLES DRAWN AEROBIC AND ANAEROBIC Blood Culture adequate volume Performed at [facility] [address] [place], [place] [address]    Culture   Final    NO GROWTH 2 DAYS Performed at [facility] [address] [place], [place] [address]    Report Status PENDING  Incomplete  Blood culture (routine x 2)     Status: None (Preliminary result)   Collection Time: 02/22/21 10:02 AM   Specimen: BLOOD  Result Value Ref Range Status   Specimen Description   Final    BLOOD LEFT ANTECUBITAL Performed at [facility] [address], [place], [place] [address]    Special Requests   Final    BOTTLES DRAWN AEROBIC AND ANAEROBIC Blood Culture results may not be optimal due to an excessive volume of blood received in culture bottles Performed  at [facility] [address], [place], [place] [address]    Culture   Final    NO GROWTH 2 DAYS Performed at [facility] [address] [place], [place] [address]    Report Status PENDING  Incomplete  Resp Panel by RT-PCR (Flu A&B, Covid) Nasopharyngeal Swab     Status: Abnormal   Collection Time: 02/22/21 11:57 AM   Specimen: Nasopharyngeal Swab; Nasopharyngeal(NP) swabs in vial transport medium  Result Value Ref Range Status   SARS Coronavirus 2 by RT PCR POSITIVE (A) NEGATIVE Final    Comment: (NOTE) SARS-CoV-2 target nucleic acids are DETECTED.  The SARS-CoV-2 RNA is generally detectable in upper respiratory specimens during the acute phase of infection. Positive results are indicative of the presence of the identified virus, but do not rule out bacterial infection or co-infection with other pathogens not detected by the  test. Clinical correlation with patient history and other diagnostic information is necessary to determine patient infection status. The expected result is Negative.  Fact Sheet for Patients: EntrepreneurPulse.com.au  Fact Sheet for Healthcare Providers: IncredibleEmployment.be  This test is not yet approved or cleared by the [place] FDA and  has been authorized for detection and/or diagnosis of SARS-CoV-2 by FDA under an Emergency Use Authorization (EUA).  This EUA will remain in effect (meaning this test can be used) for the duration of  the COVID-19 declaration under Section 564(b)(1) of the A ct, 21 U.S.C. section 360bbb-3(b)(1), unless the authorization is terminated or revoked sooner.     Influenza A by PCR NEGATIVE NEGATIVE Final   Influenza B by PCR NEGATIVE NEGATIVE Final    Comment: (NOTE) The Xpert Xpress SARS-CoV-2/FLU/RSV plus assay is intended as an aid in the diagnosis of influenza from Nasopharyngeal swab specimens and should not be used as a sole basis for  treatment. Nasal washings and aspirates are unacceptable for Xpert Xpress SARS-CoV-2/FLU/RSV testing.  Fact Sheet for Patients: EntrepreneurPulse.com.au  Fact Sheet for Healthcare Providers: IncredibleEmployment.be  This test is not yet approved or cleared by the [place] FDA and has been authorized for detection and/or diagnosis of SARS-CoV-2 by FDA under an Emergency Use Authorization (EUA). This EUA will remain in effect (meaning this test can be used) for the duration of the COVID-19 declaration under Section 564(b)(1) of the Act, 21 U.S.C. section 360bbb-3(b)(1), unless the authorization is terminated or revoked.  Performed at [facility], [place] [address], [place],  [address]         Radiology Studies: CT Angio Chest PE W and/or Wo Contrast  Result Date: 02/22/2021 CLINICAL DATA:  Shortness of breath and chest pain. Clinical concern for pulmonary embolus. History of lung cancer with abdominal pain. Elevated bilirubin and diarrhea. EXAM: CT ANGIOGRAPHY CHEST CT ABDOMEN AND PELVIS WITH CONTRAST TECHNIQUE: Multidetector CT imaging of the chest was performed using the standard protocol during bolus administration of intravenous contrast. Multiplanar CT image reconstructions and MIPs were obtained to evaluate the vascular anatomy. Multidetector CT imaging of the abdomen and pelvis was performed using the standard protocol during bolus administration of intravenous contrast. RADIATION DOSE REDUCTION: This exam was performed according to the departmental dose-optimization program which includes automated exposure control, adjustment of the mA and/or kV according to patient size and/or use of iterative reconstruction technique. CONTRAST:  82mL OMNIPAQUE IOHEXOL 350 MG/ML SOLN COMPARISON:  PET-CT 12/12/2020.  CTA chest 11/27/2020. FINDINGS: CTA CHEST FINDINGS Cardiovascular: The heart size is normal. No substantial pericardial  effusion. No thoracic aortic aneurysm. There is no filling defect within the opacified pulmonary arteries to suggest the presence of an acute pulmonary embolus. Mediastinum/Nodes: Bulky right hilar lymphadenopathy seen on the previous study has decreased with right hilar lymph node measuring 14 mm short axis today compared to 24 mm previously. Subcarinal lymphadenopathy has also decreased measuring approximate 11 mm today compared to 17 mm previously. No left hilar lymphadenopathy. Possible mild wall thickening in the mid esophagus (image 38/2). There is no axillary lymphadenopathy. Lungs/Pleura: Centrilobular and paraseptal emphysema evident. Right upper lobe pulmonary nodule has decreased in the interval measuring 9 x 7 mm today on 52/7, decreased from 12 x 11 mm (remeasured) previously. No new suspicious nodule or mass. No focal airspace consolidation. No pleural effusion. Musculoskeletal: No worrisome lytic or sclerotic osseous abnormality. Review of the MIP images confirms the above findings. CT ABDOMEN and PELVIS FINDINGS Hepatobiliary: No suspicious  focal abnormality within the liver parenchyma. Gallbladder surgically absent. Common bile duct measures 8 mm diameter in the head of the pancreas increased from 5 mm previously. Pancreas: Prominence of the main pancreatic duct in the head of pancreas is similar to prior with main pancreatic duct measuring up to 4 mm diameter. Spleen: No splenomegaly. No focal mass lesion. Adrenals/Urinary Tract: No adrenal nodule or mass. 10 mm subcapsular lesion interpolar right kidney has attenuation too high to be a simple cyst. 13 mm hypoattenuating lesion upper interpolar left kidney is likely a cyst. No evidence for hydroureter. The urinary bladder appears normal for the degree of distention. Stomach/Bowel: Stomach is unremarkable. No gastric wall thickening. No evidence of outlet obstruction. Duodenum is normally positioned as is the ligament of Treitz. No small bowel wall  thickening. No small bowel dilatation. The terminal ileum is normal. The appendix is not well visualized, but there is no edema or inflammation in the region of the cecum. No gross colonic mass. No colonic wall thickening. Diverticular changes are noted in the left colon without evidence of diverticulitis. Vascular/Lymphatic: There is mild atherosclerotic calcification of the abdominal aorta without aneurysm. There is no gastrohepatic or hepatoduodenal ligament lymphadenopathy. No retroperitoneal or mesenteric lymphadenopathy. No pelvic sidewall lymphadenopathy. Reproductive: Unremarkable. Other: No intraperitoneal free fluid. Musculoskeletal: No worrisome lytic or sclerotic osseous abnormality. Review of the MIP images confirms the above findings. IMPRESSION: 1. No evidence for acute pulmonary embolus. 2. Interval decrease in size of the right upper lobe pulmonary nodule and bulky right hilar and subcarinal lymphadenopathy. 3. No evidence for metastatic disease in the abdomen or pelvis. 4. 10 mm subcapsular lesion interpolar right kidney has attenuation too high to be a simple cyst. While this may represent a cyst complicated by proteinaceous debris or hemorrhage, neoplasm could have a similar appearance. MRI of the abdomen with and without contrast recommended to further evaluate. 5. Prominence of the main pancreatic duct in the head of pancreas is similar to prior but slightly progressive. While likely related to prior surgery, given the reported history of elevated bilirubin, MRCP or ERCP may be warranted to further evaluate. 6. Possible mild wall thickening in the mid esophagus. Esophagitis could have this appearance. 7. Aortic Atherosclerosis (ICD10-I70.0) and Emphysema (ICD10-J43.9). Electronically Signed   By: Misty Stanley M.D.   On: 02/22/2021 12:14   CT ABDOMEN PELVIS W CONTRAST  Result Date: 02/22/2021 CLINICAL DATA:  Shortness of breath and chest pain. Clinical concern for pulmonary embolus. History  of lung cancer with abdominal pain. Elevated bilirubin and diarrhea. EXAM: CT ANGIOGRAPHY CHEST CT ABDOMEN AND PELVIS WITH CONTRAST TECHNIQUE: Multidetector CT imaging of the chest was performed using the standard protocol during bolus administration of intravenous contrast. Multiplanar CT image reconstructions and MIPs were obtained to evaluate the vascular anatomy. Multidetector CT imaging of the abdomen and pelvis was performed using the standard protocol during bolus administration of intravenous contrast. RADIATION DOSE REDUCTION: This exam was performed according to the departmental dose-optimization program which includes automated exposure control, adjustment of the mA and/or kV according to patient size and/or use of iterative reconstruction technique. CONTRAST:  72mL OMNIPAQUE IOHEXOL 350 MG/ML SOLN COMPARISON:  PET-CT 12/12/2020.  CTA chest 11/27/2020. FINDINGS: CTA CHEST FINDINGS Cardiovascular: The heart size is normal. No substantial pericardial effusion. No thoracic aortic aneurysm. There is no filling defect within the opacified pulmonary arteries to suggest the presence of an acute pulmonary embolus. Mediastinum/Nodes: Bulky right hilar lymphadenopathy seen on the previous study has decreased with right  hilar lymph node measuring 14 mm short axis today compared to 24 mm previously. Subcarinal lymphadenopathy has also decreased measuring approximate 11 mm today compared to 17 mm previously. No left hilar lymphadenopathy. Possible mild wall thickening in the mid esophagus (image 38/2). There is no axillary lymphadenopathy. Lungs/Pleura: Centrilobular and paraseptal emphysema evident. Right upper lobe pulmonary nodule has decreased in the interval measuring 9 x 7 mm today on 52/7, decreased from 12 x 11 mm (remeasured) previously. No new suspicious nodule or mass. No focal airspace consolidation. No pleural effusion. Musculoskeletal: No worrisome lytic or sclerotic osseous abnormality. Review of the  MIP images confirms the above findings. CT ABDOMEN and PELVIS FINDINGS Hepatobiliary: No suspicious focal abnormality within the liver parenchyma. Gallbladder surgically absent. Common bile duct measures 8 mm diameter in the head of the pancreas increased from 5 mm previously. Pancreas: Prominence of the main pancreatic duct in the head of pancreas is similar to prior with main pancreatic duct measuring up to 4 mm diameter. Spleen: No splenomegaly. No focal mass lesion. Adrenals/Urinary Tract: No adrenal nodule or mass. 10 mm subcapsular lesion interpolar right kidney has attenuation too high to be a simple cyst. 13 mm hypoattenuating lesion upper interpolar left kidney is likely a cyst. No evidence for hydroureter. The urinary bladder appears normal for the degree of distention. Stomach/Bowel: Stomach is unremarkable. No gastric wall thickening. No evidence of outlet obstruction. Duodenum is normally positioned as is the ligament of Treitz. No small bowel wall thickening. No small bowel dilatation. The terminal ileum is normal. The appendix is not well visualized, but there is no edema or inflammation in the region of the cecum. No gross colonic mass. No colonic wall thickening. Diverticular changes are noted in the left colon without evidence of diverticulitis. Vascular/Lymphatic: There is mild atherosclerotic calcification of the abdominal aorta without aneurysm. There is no gastrohepatic or hepatoduodenal ligament lymphadenopathy. No retroperitoneal or mesenteric lymphadenopathy. No pelvic sidewall lymphadenopathy. Reproductive: Unremarkable. Other: No intraperitoneal free fluid. Musculoskeletal: No worrisome lytic or sclerotic osseous abnormality. Review of the MIP images confirms the above findings. IMPRESSION: 1. No evidence for acute pulmonary embolus. 2. Interval decrease in size of the right upper lobe pulmonary nodule and bulky right hilar and subcarinal lymphadenopathy. 3. No evidence for metastatic  disease in the abdomen or pelvis. 4. 10 mm subcapsular lesion interpolar right kidney has attenuation too high to be a simple cyst. While this may represent a cyst complicated by proteinaceous debris or hemorrhage, neoplasm could have a similar appearance. MRI of the abdomen with and without contrast recommended to further evaluate. 5. Prominence of the main pancreatic duct in the head of pancreas is similar to prior but slightly progressive. While likely related to prior surgery, given the reported history of elevated bilirubin, MRCP or ERCP may be warranted to further evaluate. 6. Possible mild wall thickening in the mid esophagus. Esophagitis could have this appearance. 7. Aortic Atherosclerosis (ICD10-I70.0) and Emphysema (ICD10-J43.9). Electronically Signed   By: Misty Stanley M.D.   On: 02/22/2021 12:14   DG Chest Portable 1 View  Result Date: 02/22/2021 CLINICAL DATA:  Shortness of breath EXAM: PORTABLE CHEST 1 VIEW COMPARISON:  Chest x-ray 12/15/2020 FINDINGS: Heart size and mediastinal contours are within normal limits. No suspicious pulmonary opacities identified. No pleural effusion or pneumothorax visualized. No acute osseous abnormality appreciated. IMPRESSION: No acute intrathoracic process identified. Electronically Signed   By: Ofilia Neas M.D.   On: 02/22/2021 10:32        Scheduled  Meds:  buPROPion  150 mg Oral BID   enoxaparin (LOVENOX) injection  40 mg Subcutaneous Q24H   nirmatrelvir/ritonavir EUA  3 tablet Oral BID   pantoprazole (PROTONIX) IV  40 mg Intravenous Q12H   potassium chloride  40 mEq Oral Once   sucralfate  1 g Oral TID WC & HS   Continuous Infusions:  sodium chloride 100 mL/hr at 02/24/21 0146     LOS: 1 day     Cordelia Poche, MD [facility] 02/24/2021, 9:21 AM  If 7PM-7AM, please contact night-coverage [email or web address]

## 2021-02-25 DIAGNOSIS — U071 COVID-19: Secondary | ICD-10-CM

## 2021-02-25 DIAGNOSIS — K208 Other esophagitis without bleeding: Principal | ICD-10-CM

## 2021-02-25 DIAGNOSIS — T66XXXA Radiation sickness, unspecified, initial encounter: Secondary | ICD-10-CM

## 2021-02-25 DIAGNOSIS — R17 Unspecified jaundice: Secondary | ICD-10-CM

## 2021-02-25 LAB — CBC
HCT: 24 % — ABNORMAL LOW (ref 36.0–46.0)
Hemoglobin: 8.4 g/dL — ABNORMAL LOW (ref 12.0–15.0)
MCH: 34.7 pg — ABNORMAL HIGH (ref 26.0–34.0)
MCHC: 35 g/dL (ref 30.0–36.0)
MCV: 99.2 fL (ref 80.0–100.0)
Platelets: 144 10*3/uL — ABNORMAL LOW (ref 150–400)
RBC: 2.42 MIL/uL — ABNORMAL LOW (ref 3.87–5.11)
RDW: 16.4 % — ABNORMAL HIGH (ref 11.5–15.5)
WBC: 1.8 10*3/uL — ABNORMAL LOW (ref 4.0–10.5)
nRBC: 0 % (ref 0.0–0.2)

## 2021-02-25 MED ORDER — MAGIC MOUTHWASH
5.0000 mL | Freq: Three times a day (TID) | ORAL | Status: DC | PRN
  Administered 2021-02-25 – 2021-02-26 (×4): 5 mL via ORAL
  Filled 2021-02-25 (×8): qty 5

## 2021-02-25 NOTE — H&P (View-Only) (Signed)
Referring Provider:  Dr. Wyline Copas, [facility] Primary Care Physician:  [name], NP Primary Gastroenterologist:  Dr. Carlean Purl  Reason for Consultation:  Odynophagia  HPI: Julie Nicholson is a 56 y.o. female with a history of NSCLC recently completed chemotherapy and radiation, hyperlipidemia, COPD.  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  She said that this all started within the past week.  Was only taking 2% viscous lidocaine at home and they were having her mix it, half with the lidocaine half of water.  She said it was not helping.  Here she is on twice daily PPI, Carafate suspension, viscous lidocaine.  She says that taking the viscous lidocaine straight definitely does help some.  She describes a constant burning sensation in her esophagus and then when she tries to eat or drink has more pain at the bottom of her esophagus/epigastrium.  CT scan of the abdomen and pelvis with contrast showed the following:   IMPRESSION: 1. No evidence for acute pulmonary embolus. 2. Interval decrease in size of the right upper lobe pulmonary nodule and bulky right hilar and subcarinal lymphadenopathy. 3. No evidence for metastatic disease in the abdomen or pelvis. 4. 10 mm subcapsular lesion interpolar right kidney has attenuation too high to be a simple cyst. While this may represent a cyst complicated by proteinaceous debris or hemorrhage, neoplasm could have a similar appearance. MRI of the abdomen with and without contrast recommended to further evaluate. 5. Prominence of the main pancreatic duct in the head of pancreas is similar to prior but slightly progressive. While likely related to prior surgery, given the reported history of elevated bilirubin, MRCP or ERCP may be warranted to further evaluate. 6. Possible mild wall thickening in the mid esophagus. Esophagitis could have this appearance. 7.  Aortic Atherosclerosis (ICD10-I70.0) and Emphysema (ICD10-J43.9).  Past Medical History:  Diagnosis Date   Abdominal discomfort    Cancer ([place])    Chronic headaches    due to allergies, sinus   COPD (chronic obstructive pulmonary disease) ([place])    per 2012 chest xray   pt states she doesn not have this now (04/10/2013)   Deviated nasal septum    Eustachian tube dysfunction    GERD (gastroesophageal reflux disease)    occasional uses Tums / Rolaids   Hearing loss    right ear   High cholesterol    Migraine    "only once in a blue moon since RX'd allergy shots" (04/10/2013)   Pancreatitis 02/08/2013   Pneumonia    Rhinitis, allergic     Past Surgical History:  Procedure Laterality Date   ABDOMINAL HYSTERECTOMY  1995   tx endometriosis, both ovaries removed   APPENDECTOMY  late 1990's   BRONCHIAL BRUSHINGS  12/15/2020   Procedure: BRONCHIAL BRUSHINGS;  Surgeon: Collene Gobble, MD;  Location: [place];  Service: Cardiopulmonary;;   BRONCHIAL NEEDLE ASPIRATION BIOPSY  12/15/2020   Procedure: BRONCHIAL NEEDLE ASPIRATION BIOPSIES;  Surgeon: Collene Gobble, MD;  Location: [place];  Service: Cardiopulmonary;;   CHOLECYSTECTOMY  04/10/2013   CHOLECYSTECTOMY N/A 04/10/2013   Procedure: LAPAROSCOPIC CHOLECYSTECTOMY WITH INTRAOPERATIVE CHOLANGIOGRAM;  Surgeon: Odis Hollingshead, MD;  Location: [place];  Service: General;  Laterality: N/A;   ELECTROMAGNETIC NAVIGATION BROCHOSCOPY  12/15/2020   Procedure: ELECTROMAGNETIC NAVIGATION BRONCHOSCOPY;  Surgeon: Collene Gobble, MD;  Location: [facility];  Service: Cardiopulmonary;;   EUS N/A 02/16/2013   Procedure: UPPER  ENDOSCOPIC ULTRASOUND (EUS) LINEAR;  Surgeon: Milus Banister, MD;  Location: [facility] ENDOSCOPY;  Service: Endoscopy;  Laterality: N/A;  radial linear   KNEE ARTHROSCOPY Right 1980's   "cartilage OR"   LAPAROSCOPIC ENDOMETRIOSIS FULGURATION  1980's   MYRINGOTOMY WITH TUBE PLACEMENT Right 07/13/2018   Procedure: MYRINGOTOMY  WITH TUBE PLACEMENT;  Surgeon: Margaretha Sheffield, MD;  Location: [place];  Service: ENT;  Laterality: Right;   NASOPHARYNGOSCOPY EUSTATION TUBE BALLOON DILATION Right 07/13/2018   Procedure: NASOPHARYNGOSCOPY EUSTATION TUBE BALLOON DILATION;  Surgeon: Margaretha Sheffield, MD;  Location: [place];  Service: ENT;  Laterality: Right;   TONSILLECTOMY AND ADENOIDECTOMY  ~ 1980   adenoidectomy   TUBAL LIGATION  ~ [place] Right 07/13/2018   Procedure: OUTFRACTURE TURBINATE;  Surgeon: Margaretha Sheffield, MD;  Location: [place];  Service: ENT;  Laterality: Right;   VIDEO BRONCHOSCOPY WITH ENDOBRONCHIAL ULTRASOUND N/A 12/15/2020   Procedure: ROBOTIC VIDEO BRONCHOSCOPY WITH ENDOBRONCHIAL ULTRASOUND;  Surgeon: Collene Gobble, MD;  Location: [place];  Service: Cardiopulmonary;  Laterality: N/A;   WRIST SURGERY Left    w/plate    Prior to Admission medications   Medication Sig Start Date End Date Taking? Authorizing Provider  acetaminophen (TYLENOL) 500 MG tablet Take 1,000 mg by mouth every 6 (six) hours as needed for moderate pain.   Yes [provider]  aspirin EC 81 MG tablet Take 81 mg by mouth at bedtime.   Yes [provider]  atorvastatin (LIPITOR) 40 MG tablet Take 40 mg by mouth at bedtime.   Yes [provider]  benzonatate (TESSALON) 100 MG capsule Take 1-2 capsules (100-200 mg total) by mouth 3 (three) times daily as needed for cough. 02/13/21  Yes Eppie Gibson, MD  Biotin 5000 MCG TABS Take 5,000 mcg by mouth at bedtime.   Yes [provider]  buPROPion (WELLBUTRIN SR) 150 MG 12 hr tablet Take 1 tablet (150 mg total) by mouth 2 (two) times daily. 01/15/21  Yes Collene Gobble, MD  Ca Carbonate-Mag Hydroxide (ROLAIDS PO) Take 1 tablet by mouth daily as needed (heartburn).   Yes [provider]  famotidine (PEPCID) 20 MG tablet Take 1 tablet (20 mg total) by mouth 2 (two) times daily. 01/15/21  Yes Collene Gobble, MD  fluticasone (FLONASE) 50 MCG/ACT nasal spray Place 2 sprays into both nostrils daily as needed for allergies.   Yes [provider]  lidocaine (XYLOCAINE) 2 % solution Patient: Mix 1part 2% viscous lidocaine, 1part H20. Swallow 54mL of diluted mixture, 90min before meals and at bedtime, up to QID PRN soreness. 02/13/21  Yes Eppie Gibson, MD  Meloxicam POWD Take 1 packet by mouth once.   Yes [provider]  Multiple Vitamins-Minerals (HAIR SKIN AND NAILS FORMULA PO) Take 1 tablet by mouth daily.   Yes [provider]  Oxycodone HCl 10 MG TABS Take 1 tablet by mouth 3 (three) times daily as needed (pain).   Yes [provider]  prochlorperazine (COMPAZINE) 10 MG tablet Take 1 tablet (10 mg total) by mouth every 6 (six) hours as needed. Patient taking differently: Take 10 mg by mouth every 6 (six) hours as needed for nausea. 12/18/20  Yes Heilingoetter, Cassandra L, PA-C  sucralfate (CARAFATE) 1 g tablet Dissolve 1 tablet in 10 mL H20 and swallow 30 min prior to meals and bedtime. 01/19/21  Yes Eppie Gibson, MD  UBRELVY 100 MG TABS Take 100 mg by mouth daily as needed (  migraine). 11/06/20  Yes [provider]  albuterol (VENTOLIN HFA) 108 (90 Base) MCG/ACT inhaler Inhale 1-2 puffs into the lungs every 6 (six) hours as needed for wheezing or shortness of breath. Patient not taking: Reported on 02/22/2021 11/27/20   Margette Fast, MD    Current Facility-Administered Medications  Medication Dose Route Frequency Provider Last Rate Last Admin   0.9 %  sodium chloride infusion   Intravenous Continuous Cherylann Ratel A, DO 100 mL/hr at 02/25/21 0726 New Bag at 02/25/21 0726   albuterol (PROVENTIL) (2.5 MG/3ML) 0.083% nebulizer solution 2.5 mg  2.5 mg Nebulization Q2H PRN Marylyn Ishihara, Tyrone A, DO       benzonatate (TESSALON) capsule 200 mg  200 mg Oral TID PRN Mariel Aloe, MD   200 mg at 02/25/21 0902   buPROPion (WELLBUTRIN SR) 12 hr tablet 150 mg  150  mg Oral BID Mariel Aloe, MD   150 mg at 02/25/21 0854   enoxaparin (LOVENOX) injection 40 mg  40 mg Subcutaneous Q24H Mariel Aloe, MD   40 mg at 02/24/21 1849   fluticasone (FLONASE) 50 MCG/ACT nasal spray 2 spray  2 spray Each Nare Daily PRN Mariel Aloe, MD   2 spray at 02/24/21 0936   lidocaine (XYLOCAINE) 2 % viscous mouth solution 15 mL  15 mL Mouth/Throat Q4H PRN Marylyn Ishihara, Tyrone A, DO   15 mL at 02/25/21 1204   magic mouthwash  5 mL Oral TID PRN Donne Hazel, MD       nirmatrelvir/ritonavir EUA (PAXLOVID) 3 tablet  3 tablet Oral BID Mariel Aloe, MD   3 tablet at 02/25/21 7867   oxyCODONE (Oxy IR/ROXICODONE) immediate release tablet 10 mg  10 mg Oral Q4H PRN Mariel Aloe, MD   10 mg at 02/25/21 0902   pantoprazole (PROTONIX) injection 40 mg  40 mg Intravenous Q12H Kyle, Tyrone A, DO   40 mg at 02/25/21 6720   prochlorperazine (COMPAZINE) injection 10 mg  10 mg Intravenous Q6H PRN Marylyn Ishihara, Tyrone A, DO   10 mg at 02/25/21 0120   sucralfate (CARAFATE) 1 GM/10ML suspension 1 g  1 g Oral TID WC & HS Kyle, Tyrone A, DO   1 g at 02/25/21 1204    Allergies as of 02/22/2021 - Review Complete 02/22/2021  Allergen Reaction Noted   Benadryl [diphenhydramine] Other (See Comments) 01/13/2021    Family History  Problem Relation Age of Onset   Diabetes Father    Diabetes Mother    COPD Mother    COPD Maternal Grandfather    COPD Maternal Aunt    COPD Maternal Uncle     Social History   Socioeconomic History   Marital status: Married    Spouse name: Not on file   Number of children: Not on file   Years of education: Not on file   Highest education level: Not on file  Occupational History   Not on file  Tobacco Use   Smoking status: Former    Packs/day: 1.00    Years: 41.00    Pack years: 41.00    Types: Cigarettes    Quit date: 12/23/2020    Years since quitting: 0.1   Smokeless tobacco: Never  Vaping Use   Vaping Use: Never used  Substance and Sexual Activity    Alcohol use: Not Currently    Comment: occasional   Drug use: No   Sexual activity: Yes    Birth control/protection: Surgical  Comment: Hysterectomy  Other Topics Concern   Not on file  Social History Narrative   Not on file   Social Determinants of Health   Financial Resource Strain: Not on file  Food Insecurity: Not on file  Transportation Needs: Not on file  Physical Activity: Not on file  Stress: Not on file  Social Connections: Not on file  Intimate Partner Violence: Not on file    Review of Systems: ROS is O/W negative except as mentioned in HPI.  Physical Exam: Vital signs in last 24 hours: Temp:  [97.7 F (36.5 C)-98.1 F (36.7 C)] 97.7 F (36.5 C) (01/25 1304) Pulse Rate:  [91-99] 99 (01/25 1304) Resp:  [15-18] 15 (01/25 1304) BP: (128-136)/(68-78) 135/78 (01/25 1304) SpO2:  [95 %-97 %] 97 % (01/25 1304) Last BM Date: 02/22/21 General:  Alert, Well-developed, well-nourished, pleasant and cooperative in NAD Head:  Normocephalic and atraumatic. Eyes:  Sclera clear, no icterus.  Conjunctiva pink. Ears:  Normal auditory acuity. Mouth:  No deformity or lesions.   Lungs:  Clear throughout to auscultation.  No wheezes, crackles, or rhonchi.  Heart:  Regular rate and rhythm; no murmurs, clicks, rubs,or gallops. Abdomen:  Soft, non-distended.  BS present.  Mild epigastric TTP.  Msk:  Symmetrical without gross deformities. Pulses:  Normal pulses noted. Extremities:  Without clubbing or edema. Neurologic:  Alert and oriented x 4;  grossly normal neurologically. Skin:  Intact without significant lesions or rashes. Psych:  Alert and cooperative. Normal mood and affect.  Intake/Output from previous day: 01/24 0701 - 01/25 0700 In: 3349.1 [P.O.:1240; I.V.:2109.1] Out: 3950 [Urine:3950] Intake/Output this shift: Total I/O In: -  Out: 800 [Urine:800]  Lab Results: Recent Labs    02/23/21 0414 02/24/21 0709 02/24/21 1656 02/25/21 0407  WBC 1.5* 1.6*  --   1.8*  HGB 8.1* 7.0* 8.6* 8.4*  HCT 23.1* 19.6* 23.4* 24.0*  PLT 146* 148*  --  144*   BMET Recent Labs    02/23/21 0414 02/24/21 0709  NA 136 136  K 3.5 3.1*  CL 107 105  CO2 20* 22  GLUCOSE 68* 63*  BUN 9 6  CREATININE 0.50 0.55  CALCIUM 8.0* 8.0*   LFT Recent Labs    02/24/21 0709  PROT 6.0*  ALBUMIN 3.0*  AST 29  ALT 38  ALKPHOS 116  BILITOT 1.1   IMPRESSION:  *Odynophagia: 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  Was not able to take much by mouth at home.  She says that she was on some type of lidocaine, but it was not helping, was different than what she is getting here (actually looks like it was the same thing but they were having her mix half lidocaine and half water).  The viscous lidocaine that she is getting here does seem to help.  She is also on twice daily PPI and Carafate suspension.  Rule out CMV esophagitis, etc. as well.  Radiation esophagitis can take weeks to heal. *NSCLC: Just recently completed chemotherapy and radiation. *Prominence of the main pancreatic duct in the head of pancreas is similar to prior but slightly progressive seen on CT scan.  Had evaluation of this with EUS back in 2015 with Dr. Ardis Hughs.  LFTs minimally elevated yesterday with a total bili of 1.5, but they have all normalized today.  PLAN: -EGD with Dr. Ardis Hughs on 1/26. -Continue current regimen for now.   Laban Emperor. Zehr  02/25/2021, 2:27 PM   _______________________________________________________________________________________________________________________________________  Velora Heckler GI MD note:  I personally examined the  patient, reviewed the data and agree with the assessment and plan described above. This is probably due to radiation related damage to the GI tract but certainly viral infection, fungal infection could present similarly. I am planning EGD tomorrow for further evaluation.  Owens Loffler, MD [facility] Pager [phone]

## 2021-02-25 NOTE — Consult Note (Addendum)
Referring Provider:  Dr. Wyline Copas, [facility] Primary Care Physician:  [name], NP Primary Gastroenterologist:  Dr. Carlean Purl  Reason for Consultation:  Odynophagia  HPI: Julie Nicholson is a 56 y.o. female with a history of NSCLC recently completed chemotherapy and radiation, hyperlipidemia, COPD.  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  She said that this all started within the past week.  Was only taking 2% viscous lidocaine at home and they were having her mix it, half with the lidocaine half of water.  She said it was not helping.  Here she is on twice daily PPI, Carafate suspension, viscous lidocaine.  She says that taking the viscous lidocaine straight definitely does help some.  She describes a constant burning sensation in her esophagus and then when she tries to eat or drink has more pain at the bottom of her esophagus/epigastrium.  CT scan of the abdomen and pelvis with contrast showed the following:   IMPRESSION: 1. No evidence for acute pulmonary embolus. 2. Interval decrease in size of the right upper lobe pulmonary nodule and bulky right hilar and subcarinal lymphadenopathy. 3. No evidence for metastatic disease in the abdomen or pelvis. 4. 10 mm subcapsular lesion interpolar right kidney has attenuation too high to be a simple cyst. While this may represent a cyst complicated by proteinaceous debris or hemorrhage, neoplasm could have a similar appearance. MRI of the abdomen with and without contrast recommended to further evaluate. 5. Prominence of the main pancreatic duct in the head of pancreas is similar to prior but slightly progressive. While likely related to prior surgery, given the reported history of elevated bilirubin, MRCP or ERCP may be warranted to further evaluate. 6. Possible mild wall thickening in the mid esophagus. Esophagitis could have this appearance. 7.  Aortic Atherosclerosis (ICD10-I70.0) and Emphysema (ICD10-J43.9).  Past Medical History:  Diagnosis Date   Abdominal discomfort    Cancer ([place])    Chronic headaches    due to allergies, sinus   COPD (chronic obstructive pulmonary disease) ([place])    per 2012 chest xray   pt states she doesn not have this now (04/10/2013)   Deviated nasal septum    Eustachian tube dysfunction    GERD (gastroesophageal reflux disease)    occasional uses Tums / Rolaids   Hearing loss    right ear   High cholesterol    Migraine    "only once in a blue moon since RX'd allergy shots" (04/10/2013)   Pancreatitis 02/08/2013   Pneumonia    Rhinitis, allergic     Past Surgical History:  Procedure Laterality Date   ABDOMINAL HYSTERECTOMY  1995   tx endometriosis, both ovaries removed   APPENDECTOMY  late 1990's   BRONCHIAL BRUSHINGS  12/15/2020   Procedure: BRONCHIAL BRUSHINGS;  Surgeon: Collene Gobble, MD;  Location: [place];  Service: Cardiopulmonary;;   BRONCHIAL NEEDLE ASPIRATION BIOPSY  12/15/2020   Procedure: BRONCHIAL NEEDLE ASPIRATION BIOPSIES;  Surgeon: Collene Gobble, MD;  Location: [place];  Service: Cardiopulmonary;;   CHOLECYSTECTOMY  04/10/2013   CHOLECYSTECTOMY N/A 04/10/2013   Procedure: LAPAROSCOPIC CHOLECYSTECTOMY WITH INTRAOPERATIVE CHOLANGIOGRAM;  Surgeon: Odis Hollingshead, MD;  Location: [place];  Service: General;  Laterality: N/A;   ELECTROMAGNETIC NAVIGATION BROCHOSCOPY  12/15/2020   Procedure: ELECTROMAGNETIC NAVIGATION BRONCHOSCOPY;  Surgeon: Collene Gobble, MD;  Location: [facility] ENDOSCOPY;  Service: Cardiopulmonary;;   EUS N/A 02/16/2013   Procedure: UPPER  ENDOSCOPIC ULTRASOUND (EUS) LINEAR;  Surgeon: Milus Banister, MD;  Location: [facility] ENDOSCOPY;  Service: Endoscopy;  Laterality: N/A;  radial linear   KNEE ARTHROSCOPY Right 1980's   "cartilage OR"   LAPAROSCOPIC ENDOMETRIOSIS FULGURATION  1980's   MYRINGOTOMY WITH TUBE PLACEMENT Right 07/13/2018   Procedure: MYRINGOTOMY  WITH TUBE PLACEMENT;  Surgeon: Margaretha Sheffield, MD;  Location: [place];  Service: ENT;  Laterality: Right;   NASOPHARYNGOSCOPY EUSTATION TUBE BALLOON DILATION Right 07/13/2018   Procedure: NASOPHARYNGOSCOPY EUSTATION TUBE BALLOON DILATION;  Surgeon: Margaretha Sheffield, MD;  Location: [place];  Service: ENT;  Laterality: Right;   TONSILLECTOMY AND ADENOIDECTOMY  ~ 1980   adenoidectomy   TUBAL LIGATION  ~ [place] Right 07/13/2018   Procedure: OUTFRACTURE TURBINATE;  Surgeon: Margaretha Sheffield, MD;  Location: [place];  Service: ENT;  Laterality: Right;   VIDEO BRONCHOSCOPY WITH ENDOBRONCHIAL ULTRASOUND N/A 12/15/2020   Procedure: ROBOTIC VIDEO BRONCHOSCOPY WITH ENDOBRONCHIAL ULTRASOUND;  Surgeon: Collene Gobble, MD;  Location: [place];  Service: Cardiopulmonary;  Laterality: N/A;   WRIST SURGERY Left    w/plate    Prior to Admission medications   Medication Sig Start Date End Date Taking? Authorizing Provider  acetaminophen (TYLENOL) 500 MG tablet Take 1,000 mg by mouth every 6 (six) hours as needed for moderate pain.   Yes [provider]  aspirin EC 81 MG tablet Take 81 mg by mouth at bedtime.   Yes [provider]  atorvastatin (LIPITOR) 40 MG tablet Take 40 mg by mouth at bedtime.   Yes [provider]  benzonatate (TESSALON) 100 MG capsule Take 1-2 capsules (100-200 mg total) by mouth 3 (three) times daily as needed for cough. 02/13/21  Yes Eppie Gibson, MD  Biotin 5000 MCG TABS Take 5,000 mcg by mouth at bedtime.   Yes [provider]  buPROPion (WELLBUTRIN SR) 150 MG 12 hr tablet Take 1 tablet (150 mg total) by mouth 2 (two) times daily. 01/15/21  Yes Collene Gobble, MD  Ca Carbonate-Mag Hydroxide (ROLAIDS PO) Take 1 tablet by mouth daily as needed (heartburn).   Yes [provider]  famotidine (PEPCID) 20 MG tablet Take 1 tablet (20 mg total) by mouth 2 (two) times daily. 01/15/21  Yes Collene Gobble, MD  fluticasone (FLONASE) 50 MCG/ACT nasal spray Place 2 sprays into both nostrils daily as needed for allergies.   Yes [provider]  lidocaine (XYLOCAINE) 2 % solution Patient: Mix 1part 2% viscous lidocaine, 1part H20. Swallow 56mL of diluted mixture, 85min before meals and at bedtime, up to QID PRN soreness. 02/13/21  Yes Eppie Gibson, MD  Meloxicam POWD Take 1 packet by mouth once.   Yes [provider]  Multiple Vitamins-Minerals (HAIR SKIN AND NAILS FORMULA PO) Take 1 tablet by mouth daily.   Yes [provider]  Oxycodone HCl 10 MG TABS Take 1 tablet by mouth 3 (three) times daily as needed (pain).   Yes [provider]  prochlorperazine (COMPAZINE) 10 MG tablet Take 1 tablet (10 mg total) by mouth every 6 (six) hours as needed. Patient taking differently: Take 10 mg by mouth every 6 (six) hours as needed for nausea. 12/18/20  Yes Heilingoetter, Cassandra L, PA-C  sucralfate (CARAFATE) 1 g tablet Dissolve 1 tablet in 10 mL H20 and swallow 30 min prior to meals and bedtime. 01/19/21  Yes Eppie Gibson, MD  UBRELVY 100 MG TABS Take 100 mg by mouth daily as needed (  migraine). 11/06/20  Yes [provider]  albuterol (VENTOLIN HFA) 108 (90 Base) MCG/ACT inhaler Inhale 1-2 puffs into the lungs every 6 (six) hours as needed for wheezing or shortness of breath. Patient not taking: Reported on 02/22/2021 11/27/20   Margette Fast, MD    Current Facility-Administered Medications  Medication Dose Route Frequency Provider Last Rate Last Admin   0.9 %  sodium chloride infusion   Intravenous Continuous Cherylann Ratel A, DO 100 mL/hr at 02/25/21 0726 New Bag at 02/25/21 0726   albuterol (PROVENTIL) (2.5 MG/3ML) 0.083% nebulizer solution 2.5 mg  2.5 mg Nebulization Q2H PRN Marylyn Ishihara, Tyrone A, DO       benzonatate (TESSALON) capsule 200 mg  200 mg Oral TID PRN Mariel Aloe, MD   200 mg at 02/25/21 0902   buPROPion (WELLBUTRIN SR) 12 hr tablet 150 mg  150  mg Oral BID Mariel Aloe, MD   150 mg at 02/25/21 0854   enoxaparin (LOVENOX) injection 40 mg  40 mg Subcutaneous Q24H Mariel Aloe, MD   40 mg at 02/24/21 1849   fluticasone (FLONASE) 50 MCG/ACT nasal spray 2 spray  2 spray Each Nare Daily PRN Mariel Aloe, MD   2 spray at 02/24/21 0936   lidocaine (XYLOCAINE) 2 % viscous mouth solution 15 mL  15 mL Mouth/Throat Q4H PRN Marylyn Ishihara, Tyrone A, DO   15 mL at 02/25/21 1204   magic mouthwash  5 mL Oral TID PRN Donne Hazel, MD       nirmatrelvir/ritonavir EUA (PAXLOVID) 3 tablet  3 tablet Oral BID Mariel Aloe, MD   3 tablet at 02/25/21 0272   oxyCODONE (Oxy IR/ROXICODONE) immediate release tablet 10 mg  10 mg Oral Q4H PRN Mariel Aloe, MD   10 mg at 02/25/21 0902   pantoprazole (PROTONIX) injection 40 mg  40 mg Intravenous Q12H Kyle, Tyrone A, DO   40 mg at 02/25/21 5366   prochlorperazine (COMPAZINE) injection 10 mg  10 mg Intravenous Q6H PRN Marylyn Ishihara, Tyrone A, DO   10 mg at 02/25/21 0120   sucralfate (CARAFATE) 1 GM/10ML suspension 1 g  1 g Oral TID WC & HS Kyle, Tyrone A, DO   1 g at 02/25/21 1204    Allergies as of 02/22/2021 - Review Complete 02/22/2021  Allergen Reaction Noted   Benadryl [diphenhydramine] Other (See Comments) 01/13/2021    Family History  Problem Relation Age of Onset   Diabetes Father    Diabetes Mother    COPD Mother    COPD Maternal Grandfather    COPD Maternal Aunt    COPD Maternal Uncle     Social History   Socioeconomic History   Marital status: Married    Spouse name: Not on file   Number of children: Not on file   Years of education: Not on file   Highest education level: Not on file  Occupational History   Not on file  Tobacco Use   Smoking status: Former    Packs/day: 1.00    Years: 41.00    Pack years: 41.00    Types: Cigarettes    Quit date: 12/23/2020    Years since quitting: 0.1   Smokeless tobacco: Never  Vaping Use   Vaping Use: Never used  Substance and Sexual Activity    Alcohol use: Not Currently    Comment: occasional   Drug use: No   Sexual activity: Yes    Birth control/protection: Surgical  Comment: Hysterectomy  Other Topics Concern   Not on file  Social History Narrative   Not on file   Social Determinants of Health   Financial Resource Strain: Not on file  Food Insecurity: Not on file  Transportation Needs: Not on file  Physical Activity: Not on file  Stress: Not on file  Social Connections: Not on file  Intimate Partner Violence: Not on file    Review of Systems: ROS is O/W negative except as mentioned in HPI.  Physical Exam: Vital signs in last 24 hours: Temp:  [97.7 F (36.5 C)-98.1 F (36.7 C)] 97.7 F (36.5 C) (01/25 1304) Pulse Rate:  [91-99] 99 (01/25 1304) Resp:  [15-18] 15 (01/25 1304) BP: (128-136)/(68-78) 135/78 (01/25 1304) SpO2:  [95 %-97 %] 97 % (01/25 1304) Last BM Date: 02/22/21 General:  Alert, Well-developed, well-nourished, pleasant and cooperative in NAD Head:  Normocephalic and atraumatic. Eyes:  Sclera clear, no icterus.  Conjunctiva pink. Ears:  Normal auditory acuity. Mouth:  No deformity or lesions.   Lungs:  Clear throughout to auscultation.  No wheezes, crackles, or rhonchi.  Heart:  Regular rate and rhythm; no murmurs, clicks, rubs,or gallops. Abdomen:  Soft, non-distended.  BS present.  Mild epigastric TTP.  Msk:  Symmetrical without gross deformities. Pulses:  Normal pulses noted. Extremities:  Without clubbing or edema. Neurologic:  Alert and oriented x 4;  grossly normal neurologically. Skin:  Intact without significant lesions or rashes. Psych:  Alert and cooperative. Normal mood and affect.  Intake/Output from previous day: 01/24 0701 - 01/25 0700 In: 3349.1 [P.O.:1240; I.V.:2109.1] Out: 3950 [Urine:3950] Intake/Output this shift: Total I/O In: -  Out: 800 [Urine:800]  Lab Results: Recent Labs    02/23/21 0414 02/24/21 0709 02/24/21 1656 02/25/21 0407  WBC 1.5* 1.6*  --   1.8*  HGB 8.1* 7.0* 8.6* 8.4*  HCT 23.1* 19.6* 23.4* 24.0*  PLT 146* 148*  --  144*   BMET Recent Labs    02/23/21 0414 02/24/21 0709  NA 136 136  K 3.5 3.1*  CL 107 105  CO2 20* 22  GLUCOSE 68* 63*  BUN 9 6  CREATININE 0.50 0.55  CALCIUM 8.0* 8.0*   LFT Recent Labs    02/24/21 0709  PROT 6.0*  ALBUMIN 3.0*  AST 29  ALT 38  ALKPHOS 116  BILITOT 1.1   IMPRESSION:  *Odynophagia: 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  Was not able to take much by mouth at home.  She says that she was on some type of lidocaine, but it was not helping, was different than what she is getting here (actually looks like it was the same thing but they were having her mix half lidocaine and half water).  The viscous lidocaine that she is getting here does seem to help.  She is also on twice daily PPI and Carafate suspension.  Rule out CMV esophagitis, etc. as well.  Radiation esophagitis can take weeks to heal. *NSCLC: Just recently completed chemotherapy and radiation. *Prominence of the main pancreatic duct in the head of pancreas is similar to prior but slightly progressive seen on CT scan.  Had evaluation of this with EUS back in 2015 with Dr. Ardis Hughs.  LFTs minimally elevated yesterday with a total bili of 1.5, but they have all normalized today.  PLAN: -EGD with Dr. Ardis Hughs on 1/26. -Continue current regimen for now.   Laban Emperor. Zehr  02/25/2021, 2:27 PM   _______________________________________________________________________________________________________________________________________  Velora Heckler GI MD note:  I personally examined the  patient, reviewed the data and agree with the assessment and plan described above. This is probably due to radiation related damage to the GI tract but certainly viral infection, fungal infection could present similarly. I am planning EGD tomorrow for further evaluation.  Owens Loffler, MD [facility] Pager [phone]

## 2021-02-25 NOTE — Progress Notes (Signed)
PROGRESS NOTE    Julie Nicholson  YOV:[id number] DOB: 1965/08/28 DOA: 02/22/2021 PCP: [name], NP    Brief Narrative:  56 y.o. female with a history of NSCLC recently on chemotherapy and radiation, hyperlipidemia, COPD, GERD. 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 Julien Nordmann as an outpatient. Patient has completed course of chemotherapy/radiation therapy.    COVID-19 virus infection- (present on admission) No associated hypoxia or pneumonia. -Paxlovid to complete on 1/28   Antineoplastic chemotherapy induced pancytopenia (HCC) WBC of 1.9 on admission with an Dana of 1400. Hemoglobin 10.6 > 8.1 > 7 after IV fluids. Platelets mildly decreased. Afebrile. No evidence of bleeding; no hemoptysis or melena. -Hgb has remained stable -Repeat CBC in AM   Hyperlipidemia Patient is on Lipitor as an outpatient which was held secondary to NPO status. Will continue to hold while in Remdesivir with elevated LFTs.   GERD (gastroesophageal reflux disease)- (present on admission) -Continue Protonix -Continue Carafate   Elevated LFTs-resolved as of 02/24/2021 Mild. In setting of COVID-19  infection. Now normalized    DVT prophylaxis: SCD's Code Status: Full Family Communication: Pt in room, family at bedside  Status is: Inpatient  Remains inpatient appropriate because: Severity of illness    Consultants:    Procedures:    Antimicrobials: Anti-infectives (From admission, onward)    Start     Dose/Rate Route Frequency Ordered Stop   02/23/21 2200  nirmatrelvir/ritonavir EUA (PAXLOVID) 3 tablet        3 tablet Oral 2 times daily 02/23/21 1710 02/28/21 2159       Subjective: Still complaining of difficulty swallowing, unable to tolerate beyond a liquid diet  Objective: Vitals:   02/24/21 1323 02/24/21 2028 02/25/21 0600 02/25/21 1304  BP: 132/78 136/68 128/68 135/78  Pulse: 97 91 91 99  Resp: 18 18 15 15   Temp: 97.8 F (36.6 C) 97.7 F (36.5 C) 98.1 F (36.7 C) 97.7 F (36.5 C)  TempSrc:  Oral Oral   SpO2: 97% 97% 95% 97%  Weight:      Height:        Intake/Output Summary (Last 24 hours) at 02/25/2021 1340 Last data filed at 02/25/2021 1206 Gross per 24 hour  Intake 2589.07 ml  Output 3000 ml  Net -410.93 ml   Filed Weights   02/23/21 0649  Weight: 48 kg    Examination: General exam: Awake, laying in bed, in nad Respiratory system: Normal respiratory effort, no wheezing Cardiovascular system: regular rate, s1, s2 Gastrointestinal system: Soft, nondistended, positive BS Central nervous system: CN2-12 grossly intact, strength intact Extremities: Perfused, no clubbing Skin: Normal skin turgor, no notable skin lesions seen Psychiatry: Mood normal // no visual hallucinations   Data Reviewed: I have personally reviewed following labs and imaging studies  CBC: Recent  Labs  Lab 02/22/21 1001 02/23/21 0414 02/24/21 0709 02/24/21 1656 02/25/21 0407  WBC 1.9* 1.5* 1.6*  --  1.8*  NEUTROABS 1.4*  --  1.1*  --   --   HGB 10.6* 8.1* 7.0* 8.6* 8.4*  HCT 30.5* 23.1* 19.6* 23.4* 24.0*  MCV 97.1 98.3 97.0  --  99.2  PLT 153 146* 148*  --   564*   Basic Metabolic Panel: Recent Labs  Lab 02/22/21 1001 02/23/21 0414 02/24/21 0709  NA 142 136 136  K 4.0 3.5 3.1*  CL 100 107 105  CO2 25 20* 22  GLUCOSE 100* 68* 63*  BUN 12 9 6   CREATININE 0.69 0.50 0.55  CALCIUM 10.0 8.0* 8.0*  MG  --   --  1.4*   GFR: Estimated Creatinine Clearance: 58.5 mL/min (by C-G formula based on SCr of 0.55 mg/dL). Liver Function Tests: Recent Labs  Lab 02/22/21 1001 02/23/21 0414 02/24/21 0709  AST 19 51* 29  ALT 22 54* 38  ALKPHOS 95 138* 116  BILITOT 1.5* 1.3* 1.1  PROT 8.1 5.8* 6.0*  ALBUMIN 4.1 2.9* 3.0*   Recent Labs  Lab 02/22/21 1001  LIPASE 39   No results for input(s): AMMONIA in the last 168 hours. Coagulation Profile: No results for input(s): INR, PROTIME in the last 168 hours. Cardiac Enzymes: No results for input(s): CKTOTAL, CKMB, CKMBINDEX, TROPONINI in the last 168 hours. BNP (last 3 results) No results for input(s): PROBNP in the last 8760 hours. HbA1C: No results for input(s): HGBA1C in the last 72 hours. CBG: No results for input(s): GLUCAP in the last 168 hours. Lipid Profile: No results for input(s): CHOL, HDL, LDLCALC, TRIG, CHOLHDL, LDLDIRECT in the last 72 hours. Thyroid Function Tests: No results for input(s): TSH, T4TOTAL, FREET4, T3FREE, THYROIDAB in the last 72 hours. Anemia Panel: No results for input(s): VITAMINB12, FOLATE, FERRITIN, TIBC, IRON, RETICCTPCT in the last 72 hours. Sepsis Labs: Recent Labs  Lab 02/22/21 1002 02/22/21 1230  LATICACIDVEN 1.7 0.9    Recent Results (from the past 240 hour(s))  Group A Strep by PCR     Status: None   Collection Time: 02/22/21  9:45 AM   Specimen: Sterile Swab  Result Value Ref Range Status   Group A Strep by PCR NOT DETECTED NOT DETECTED Final    Comment: Performed at [facility], [place] [address], [place], [place] [address]  Blood culture (routine x 2)     Status: None (Preliminary result)   Collection Time: 02/22/21  10:02 AM   Specimen: BLOOD  Result Value Ref Range Status   Specimen Description   Final    BLOOD RIGHT ANTECUBITAL Performed at [facility] [address] [place], [place] [address]    Special Requests   Final    BOTTLES DRAWN AEROBIC AND ANAEROBIC Blood Culture adequate volume Performed at [facility] [address], [place], [place] [address]    Culture   Final    NO GROWTH 3 DAYS Performed at [facility], [place] [address], [place], [place] [address]    Report Status PENDING  Incomplete  Blood culture (routine x 2)     Status: None (Preliminary result)   Collection Time: 02/22/21 10:02 AM   Specimen: BLOOD  Result Value Ref Range Status   Specimen Description   Final    BLOOD LEFT ANTECUBITAL Performed at [place] [address] [place], [place] [address]  Special Requests   Final    BOTTLES DRAWN AEROBIC AND ANAEROBIC Blood Culture results may not be optimal due to an excessive volume of blood received in culture bottles Performed at [place] [address], [place], [place] [address]    Culture   Final    NO GROWTH 3 DAYS Performed at [facility], [place] [address], [place], [place] [address]    Report Status PENDING  Incomplete  Resp Panel by RT-PCR (Flu A&B, Covid) Nasopharyngeal Swab     Status: Abnormal   Collection Time: 02/22/21 11:57 AM   Specimen: Nasopharyngeal Swab; Nasopharyngeal(NP) swabs in vial transport medium  Result Value Ref Range Status   SARS Coronavirus 2 by RT PCR POSITIVE (A) NEGATIVE Final    Comment: (NOTE) SARS-CoV-2 target nucleic acids are DETECTED.  The SARS-CoV-2 RNA is generally detectable in upper respiratory specimens during the acute phase of infection. Positive results are indicative of the presence of the identified virus, but do not rule out bacterial infection or co-infection with other pathogens not detected by the test.  Clinical correlation with patient history and other diagnostic information is necessary to determine patient infection status. The expected result is Negative.  Fact Sheet for Patients: EntrepreneurPulse.com.au  Fact Sheet for Healthcare Providers: IncredibleEmployment.be  This test is not yet approved or cleared by the [place] FDA and  has been authorized for detection and/or diagnosis of SARS-CoV-2 by FDA under an Emergency Use Authorization (EUA).  This EUA will remain in effect (meaning this test can be used) for the duration of  the COVID-19 declaration under Section 564(b)(1) of the A ct, 21 U.S.C. section 360bbb-3(b)(1), unless the authorization is terminated or revoked sooner.     Influenza A by PCR NEGATIVE NEGATIVE Final   Influenza B by PCR NEGATIVE NEGATIVE Final    Comment: (NOTE) The Xpert Xpress SARS-CoV-2/FLU/RSV plus assay is intended as an aid in the diagnosis of influenza from Nasopharyngeal swab specimens and should not be used as a sole basis for treatment. Nasal washings and aspirates are unacceptable for Xpert Xpress SARS-CoV-2/FLU/RSV testing.  Fact Sheet for Patients: EntrepreneurPulse.com.au  Fact Sheet for Healthcare Providers: IncredibleEmployment.be  This test is not yet approved or cleared by the [place] FDA and has been authorized for detection and/or diagnosis of SARS-CoV-2 by FDA under an Emergency Use Authorization (EUA). This EUA will remain in effect (meaning this test can be used) for the duration of the COVID-19 declaration under Section 564(b)(1) of the Act, 21 U.S.C. section 360bbb-3(b)(1), unless the authorization is terminated or revoked.  Performed at [facility], [place] [address], [place], [place] [address]      Radiology Studies: No results found.  Scheduled Meds:  buPROPion  150 mg Oral BID   enoxaparin (LOVENOX)  injection  40 mg Subcutaneous Q24H   nirmatrelvir/ritonavir EUA  3 tablet Oral BID   pantoprazole (PROTONIX) IV  40 mg Intravenous Q12H   sucralfate  1 g Oral TID WC & HS   Continuous Infusions:  sodium chloride 100 mL/hr at 02/25/21 0726     LOS: 2 days   Marylu Lund, MD [facility] Pager On Amion  If 7PM-7AM, please contact night-coverage 02/25/2021, 1:40 PM

## 2021-02-26 ENCOUNTER — Encounter (HOSPITAL_COMMUNITY): Payer: Self-pay | Admitting: Internal Medicine

## 2021-02-26 ENCOUNTER — Inpatient Hospital Stay (HOSPITAL_COMMUNITY): Payer: Commercial Managed Care - PPO | Admitting: Anesthesiology

## 2021-02-26 ENCOUNTER — Encounter (HOSPITAL_COMMUNITY)
Admission: EM | Disposition: A | Discharge status: Home / Self Care | Payer: Self-pay | Source: Home / Self Care | Attending: Internal Medicine

## 2021-02-26 DIAGNOSIS — K2100 Gastro-esophageal reflux disease with esophagitis, without bleeding: Secondary | ICD-10-CM

## 2021-02-26 HISTORY — PX: BIOPSY: SHX5522

## 2021-02-26 HISTORY — PX: ESOPHAGOGASTRODUODENOSCOPY (EGD) WITH PROPOFOL: SHX5813

## 2021-02-26 LAB — CBC
HCT: 24.9 % — ABNORMAL LOW (ref 36.0–46.0)
Hemoglobin: 8.6 g/dL — ABNORMAL LOW (ref 12.0–15.0)
MCH: 34.4 pg — ABNORMAL HIGH (ref 26.0–34.0)
MCHC: 34.5 g/dL (ref 30.0–36.0)
MCV: 99.6 fL (ref 80.0–100.0)
Platelets: 150 10*3/uL (ref 150–400)
RBC: 2.5 MIL/uL — ABNORMAL LOW (ref 3.87–5.11)
RDW: 17.3 % — ABNORMAL HIGH (ref 11.5–15.5)
WBC: 1.5 10*3/uL — ABNORMAL LOW (ref 4.0–10.5)
nRBC: 0 % (ref 0.0–0.2)

## 2021-02-26 LAB — COMPREHENSIVE METABOLIC PANEL
ALT: 32 U/L (ref 0–44)
AST: 22 U/L (ref 15–41)
Albumin: 3.3 g/dL — ABNORMAL LOW (ref 3.5–5.0)
Alkaline Phosphatase: 105 U/L (ref 38–126)
Anion gap: 10 (ref 5–15)
BUN: 6 mg/dL (ref 6–20)
CO2: 23 mmol/L (ref 22–32)
Calcium: 8.6 mg/dL — ABNORMAL LOW (ref 8.9–10.3)
Chloride: 104 mmol/L (ref 98–111)
Creatinine, Ser: 0.55 mg/dL (ref 0.44–1.00)
GFR, Estimated: >60 mL/min (ref >60)
Glucose, Bld: 73 mg/dL (ref 70–99)
Potassium: 3.7 mmol/L (ref 3.5–5.1)
Sodium: 137 mmol/L (ref 135–145)
Total Bilirubin: 1.1 mg/dL (ref 0.3–1.2)
Total Protein: 6.2 g/dL — ABNORMAL LOW (ref 6.5–8.1)

## 2021-02-26 SURGERY — ESOPHAGOGASTRODUODENOSCOPY (EGD) WITH PROPOFOL
Anesthesia: Monitor Anesthesia Care

## 2021-02-26 MED ORDER — LACTATED RINGERS IV SOLN: INTRAVENOUS | Status: DC | PRN

## 2021-02-26 MED ORDER — PROPOFOL 10 MG/ML IV BOLUS
INTRAVENOUS | Status: DC | PRN
  Administered 2021-02-26 (×3): 40 mg via INTRAVENOUS
  Administered 2021-02-26 (×4): 20 mg via INTRAVENOUS
  Administered 2021-02-26: 40 mg via INTRAVENOUS

## 2021-02-26 MED ORDER — LIDOCAINE 2% (20 MG/ML) 5 ML SYRINGE
INTRAMUSCULAR | Status: DC | PRN
  Administered 2021-02-26: 40 mg via INTRAVENOUS

## 2021-02-26 MED ORDER — SODIUM CHLORIDE 0.9 % IV SOLN: INTRAVENOUS | Status: DC

## 2021-02-26 SURGICAL SUPPLY — 15 items

## 2021-02-26 NOTE — Interval H&P Note (Signed)
History and Physical Interval Note:  02/26/2021 3:43 PM  Julie Nicholson  has presented today for surgery, with the diagnosis of Odynophagia.  The various methods of treatment have been discussed with the patient and family. After consideration of risks, benefits and other options for treatment, the patient has consented to  Procedure(s): ESOPHAGOGASTRODUODENOSCOPY (EGD) WITH PROPOFOL (N/A) as a surgical intervention.  The patient's history has been reviewed, patient examined, no change in status, stable for surgery.  I have reviewed the patient's chart and labs.  Questions were answered to the patient's satisfaction.     Milus Banister

## 2021-02-26 NOTE — Op Note (Signed)
[facility] Patient Name: Julie Nicholson Procedure Date: 02/26/2021 MRN: [id number] Attending MD: Milus Banister , MD Date of Birth: April 12, 1965 CSN: [id number] Age: 56 Admit Type: Inpatient Procedure:                Upper GI endoscopy Indications:              Epigastric burning, odynophagia, recently completed                            chemo/xrt for lung cancer Providers:                Milus Banister, MD, Jeanella Cara, RN,                            Cletis Athens, Technician, Glenis Smoker, CRNA Referring MD:              Medicines:                Monitored Anesthesia Care Complications:            No immediate complications. Estimated blood loss:                            None. Estimated Blood Loss:     Estimated blood loss: none. Procedure:                Pre-Anesthesia Assessment:                           - Prior to the procedure, a History and Physical                            was performed, and patient medications and                            allergies were reviewed. The patient's tolerance of                            previous anesthesia was also reviewed. The risks                            and benefits of the procedure and the sedation                            options and risks were discussed with the patient.                            All questions were answered, and informed consent                            was obtained. Prior Anticoagulants: The patient has                            taken no previous anticoagulant or antiplatelet                            agents.  ASA Grade Assessment: II - A patient with                            mild systemic disease. After reviewing the risks                            and benefits, the patient was deemed in                            satisfactory condition to undergo the procedure.                           After obtaining informed consent, the endoscope was                            passed  under direct vision. Throughout the                            procedure, the patient's blood pressure, pulse, and                            oxygen saturations were monitored continuously. The                            [id number]-H190 ([id number]) Olympus endoscope was introduced                            through the mouth, and advanced to the second part                            of duodenum. The upper GI endoscopy was                            accomplished without difficulty. The patient                            tolerated the procedure well. Scope In: Scope Out: Findings:      Single 1cm oval shaped very shallow ulceration vs. gastric inlet patch       in the very proximal esophagus (18cm from the incisors). See images. I       biopsied the lesion and sent it for viral culture.      5cm long segment of circumferential moderate inflammation (edema,       erythema, friability) located in the mid esohagus with proximal edge at       28cm from the incisors.      Normal Z line at 35cm from the incisors      The exam was otherwise without abnormality. Impression:               - Single 1cm oval shaped very shallow ulceration                            vs. gastric inlet patch in the very proximal  esophagus (18cm from the incisors). See images. I                            biopsied the lesion and sent it for viral culture.                           - 5cm long segment of circumferential moderate                            inflammation (edema, erythema, friability) located                            in the mid esohagus with proximal edge at 28cm from                            the incisors. This is due to radiation.                           - Normal Z line at 35cm from the incisors                           - She also appears to have a cold sore on right                            side of lips. I am going to start empiric                            valcyclovir 1gm BID  for two weeks. Await viral                            culture result as an outpatient.                           - The radiation related esophagitis will take time                            to heal. Viscous lidocaine before any oral intake                            will be helpful. She needs to push hydration and                            full nutritional liquids until she is able to                            tolerate solid food.                           - She is OK for d/c from GI perspective. Understand                            that it will take several more days at least for  the radiation related damage to heal. Moderate Sedation:      Not Applicable - Patient had care per Anesthesia. Recommendation:           - See above Procedure Code(s):        --- Professional ---                           [phone], Esophagogastroduodenoscopy, flexible,                            transoral; with biopsy, single or multiple Diagnosis Code(s):        --- Professional ---                           K21.00, Gastro-esophageal reflux disease with                            esophagitis, without bleeding                           R10.13, Epigastric pain CPT copyright 2019 American Medical Association. All rights reserved. The codes documented in this report are preliminary and upon coder review may  be revised to meet current compliance requirements. Milus Banister, MD 02/26/2021 4:32:31 PM This report has been signed electronically. Number of Addenda: 0

## 2021-02-26 NOTE — Anesthesia Preprocedure Evaluation (Addendum)
Anesthesia Evaluation  Patient identified by MRN, date of birth, ID band Patient awake    Reviewed: Allergy & Precautions, NPO status , Patient's Chart, lab work & pertinent test results  Airway Mallampati: I  TM Distance: >3 FB Neck ROM: Full    Dental  (+) Dental Advisory Given   Pulmonary COPD, former smoker,  NSCLC on chemo/radiation  COVID positive   Pulmonary exam normal        Cardiovascular negative cardio ROS   Rhythm:Regular Rate:Tachycardia     Neuro/Psych  Headaches, negative psych ROS   GI/Hepatic Neg liver ROS, GERD  Controlled,  Endo/Other  negative endocrine ROS  Renal/GU negative Renal ROS  negative genitourinary   Musculoskeletal negative musculoskeletal ROS (+)   Abdominal   Peds  Hematology  (+) Blood dyscrasia, anemia , Lab Results      Component                Value               Date                      WBC                      1.5 (L)             02/26/2021                HGB                      8.6 (L)             02/26/2021                HCT                      24.9 (L)            02/26/2021                MCV                      99.6                02/26/2021                PLT                      150                 02/26/2021              Anesthesia Other Findings 56 y.o. female with medical history significant of NSCLC on chemo/radiation, HLD, COPD, GERD. Presenting w/ odynophagia. Likely has radiation-induced esophagitis  Reproductive/Obstetrics                            Anesthesia Physical Anesthesia Plan  ASA: 3  Anesthesia Plan: MAC   Post-op Pain Management:    Induction: Intravenous  PONV Risk Score and Plan: 2 and Propofol infusion and Treatment may vary due to age or medical condition  Airway Management Planned: Natural Airway and Nasal Cannula  Additional Equipment: None  Intra-op Plan:   Post-operative Plan:   Informed  Consent: I have reviewed the patients History and Physical, chart, labs and discussed the procedure including the risks, benefits and  alternatives for the proposed anesthesia with the patient or authorized representative who has indicated his/her understanding and acceptance.     Dental advisory given  Plan Discussed with: CRNA and Anesthesiologist  Anesthesia Plan Comments:        Anesthesia Quick Evaluation

## 2021-02-26 NOTE — Transfer of Care (Addendum)
Immediate Anesthesia Transfer of Care Note  Patient: Julie Nicholson  Procedure(s) Performed: ESOPHAGOGASTRODUODENOSCOPY (EGD) WITH PROPOFOL BIOPSY  Patient Location: PACU and Endoscopy Unit  Anesthesia Type:MAC  Level of Consciousness: sedated  Airway & Oxygen Therapy: Patient Spontanous Breathing and Patient connected to face mask oxygen  Post-op Assessment: Report given to RN and Post -op Vital signs reviewed and stable  Post vital signs: Reviewed and stable  Last Vitals:  Vitals Value Taken Time  BP    Temp    Pulse    Resp    SpO2      Last Pain:  Vitals:   02/26/21 1533  TempSrc: Oral  PainSc: 0-No pain      Patients Stated Pain Goal: 0 (82/57/49 3552)  Complications: No notable events documented.

## 2021-02-26 NOTE — Progress Notes (Signed)
PROGRESS NOTE    SACRED ROA  KDT:[id number] DOB: 1965-06-18 DOA: 02/22/2021 PCP: [name], NP    Brief Narrative:  56 y.o. female with a history of NSCLC recently on chemotherapy and radiation, hyperlipidemia, COPD, GERD. 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  Despite scheduled PPI, carafate, lidocaine, pt has been unable to advance past a clear diet  Assessment & Plan:   Principal Problem:   Radiation esophagitis Active Problems:   Primary adenocarcinoma of upper lobe of right lung (HCC)   GERD (gastroesophageal reflux disease)   Hyperlipidemia   Antineoplastic chemotherapy induced pancytopenia ([place])   COVID-19 virus infection  * Radiation esophagitis- (present on admission) Still with some improvement since presentation -attempted to advance to fulls, however pt remains unable to tolerate advanced diet limited to pain -Continue IV fluids -Seen by GI. Given lack of clinical improvement, plans for EGD today to r/o other pathology such as CMV or HSV   Primary adenocarcinoma of upper lobe of right lung ([place])- (present on admission) Patient follows with Dr. Julien Nordmann as an outpatient. Patient has completed course of chemotherapy/radiation therapy.    COVID-19 virus infection- (present on admission) No associated hypoxia or pneumonia. -Paxlovid to complete on 1/28 -Stable from COVID standpoint   Antineoplastic chemotherapy induced pancytopenia (HCC) WBC of 1.9 on admission with an [place] of 1400. Hemoglobin 10.6 > 8.1 > 7 after IV fluids. Platelets mildly decreased. Afebrile. No evidence of bleeding; no hemoptysis or melena. -CBC trends remain stable   Hyperlipidemia Patient is on Lipitor as an outpatient which was held secondary to NPO status. Will continue to hold while in Remdesivir with elevated LFTs.   GERD (gastroesophageal reflux disease)- (present on admission) -Continue Protonix -Continue Carafate    Elevated LFTs-resolved as of 02/24/2021 Mild. In setting of COVID-19 infection. Now normalized Cont to follow for now    DVT prophylaxis: SCD's Code Status: Full Family Communication: Pt in room, family currently not at bedside  Status is: Inpatient  Remains inpatient appropriate because: Severity of illness    Consultants:    Procedures:    Antimicrobials: Anti-infectives (From admission, onward)    Start     Dose/Rate Route Frequency Ordered Stop   02/23/21 2200  nirmatrelvir/ritonavir EUA (PAXLOVID) 3 tablet        3 tablet Oral 2 times daily 02/23/21 1710 02/28/21 2159       Subjective: Continues with persistent chest discomfort. Eager to have EGD  Objective: Vitals:   02/25/21 0600 02/25/21 1304 02/25/21 2113 02/26/21 0512  BP: 128/68 135/78 126/70 129/71  Pulse: 91 99 98 92  Resp: 15 15 18 14   Temp: 98.1 F (36.7 C) 97.7 F (36.5 C) 98 F (36.7 C)   TempSrc: Oral     SpO2: 95% 97% 95% 94%  Weight:      Height:        Intake/Output Summary (Last 24 hours) at 02/26/2021 1350 Last data filed at 02/26/2021 9983 Gross per 24 hour  Intake 2000 ml  Output 2650 ml  Net -650 ml    Filed Weights   02/23/21 0649  Weight: 48 kg    Examination: General exam: Conversant, in no acute distress Respiratory system: normal chest rise, clear, no audible wheezing Cardiovascular system: regular rhythm, s1-s2 Gastrointestinal system: Nondistended, nontender, pos BS Central nervous system: No seizures, no tremors Extremities: No cyanosis, no joint deformities Skin: No rashes, no pallor Psychiatry: Affect normal // no auditory hallucinations   Data Reviewed: I  have personally reviewed following labs and imaging studies  CBC: Recent Labs  Lab 02/22/21 1001 02/23/21 0414 02/24/21 0709 02/24/21 1656 02/25/21 0407 02/26/21 0404  WBC 1.9* 1.5* 1.6*  --  1.8* 1.5*  NEUTROABS 1.4*  --  1.1*  --   --   --   HGB 10.6* 8.1* 7.0* 8.6* 8.4* 8.6*  HCT 30.5* 23.1*  19.6* 23.4* 24.0* 24.9*  MCV 97.1 98.3 97.0  --  99.2 99.6  PLT 153 146* 148*  --  144* 540    Basic Metabolic Panel: Recent Labs  Lab 02/22/21 1001 02/23/21 0414 02/24/21 0709 02/26/21 0404  NA 142 136 136 137  K 4.0 3.5 3.1* 3.7  CL 100 107 105 104  CO2 25 20* 22 23  GLUCOSE 100* 68* 63* 73  BUN 12 9 6 6   CREATININE 0.69 0.50 0.55 0.55  CALCIUM 10.0 8.0* 8.0* 8.6*  MG  --   --  1.4*  --     GFR: Estimated Creatinine Clearance: 58.5 mL/min (by C-G formula based on SCr of 0.55 mg/dL). Liver Function Tests: Recent Labs  Lab 02/22/21 1001 02/23/21 0414 02/24/21 0709 02/26/21 0404  AST 19 51* 29 22  ALT 22 54* 38 32  ALKPHOS 95 138* 116 105  BILITOT 1.5* 1.3* 1.1 1.1  PROT 8.1 5.8* 6.0* 6.2*  ALBUMIN 4.1 2.9* 3.0* 3.3*    Recent Labs  Lab 02/22/21 1001  LIPASE 39    No results for input(s): AMMONIA in the last 168 hours. Coagulation Profile: No results for input(s): INR, PROTIME in the last 168 hours. Cardiac Enzymes: No results for input(s): CKTOTAL, CKMB, CKMBINDEX, TROPONINI in the last 168 hours. BNP (last 3 results) No results for input(s): PROBNP in the last 8760 hours. HbA1C: No results for input(s): HGBA1C in the last 72 hours. CBG: No results for input(s): GLUCAP in the last 168 hours. Lipid Profile: No results for input(s): CHOL, HDL, LDLCALC, TRIG, CHOLHDL, LDLDIRECT in the last 72 hours. Thyroid Function Tests: No results for input(s): TSH, T4TOTAL, FREET4, T3FREE, THYROIDAB in the last 72 hours. Anemia Panel: No results for input(s): VITAMINB12, FOLATE, FERRITIN, TIBC, IRON, RETICCTPCT in the last 72 hours. Sepsis Labs: Recent Labs  Lab 02/22/21 1002 02/22/21 1230  LATICACIDVEN 1.7 0.9     Recent Results (from the past 240 hour(s))  Group A Strep by PCR     Status: None   Collection Time: 02/22/21  9:45 AM   Specimen: Sterile Swab  Result Value Ref Range Status   Group A Strep by PCR NOT DETECTED NOT DETECTED Final    Comment:  Performed at [facility], [place] [address] [place], [place] [address]  Blood culture (routine x 2)     Status: None (Preliminary result)   Collection Time: 02/22/21 10:02 AM   Specimen: BLOOD  Result Value Ref Range Status   Specimen Description   Final    BLOOD RIGHT ANTECUBITAL Performed at [facility] [address] [place], [place] [address]    Special Requests   Final    BOTTLES DRAWN AEROBIC AND ANAEROBIC Blood Culture adequate volume Performed at [place] [address], [place], [place] [address]    Culture   Final    NO GROWTH 4 DAYS Performed at [facility], [place] [address] [place], [place] [address]    Report Status PENDING  Incomplete  Blood culture (routine x 2)     Status: None (Preliminary  result)   Collection Time: 02/22/21 10:02 AM   Specimen: BLOOD  Result Value Ref Range Status   Specimen Description   Final    BLOOD LEFT ANTECUBITAL Performed at [facility] [address] [place], [place] [address]    Special Requests   Final    BOTTLES DRAWN AEROBIC AND ANAEROBIC Blood Culture results may not be optimal due to an excessive volume of blood received in culture bottles Performed at [place] [address], [place], [place] [address]    Culture   Final    NO GROWTH 4 DAYS Performed at [facility], [place] [address] [place], [place] [address]    Report Status PENDING  Incomplete  Resp Panel by RT-PCR (Flu A&B, Covid) Nasopharyngeal Swab     Status: Abnormal   Collection Time: 02/22/21 11:57 AM   Specimen: Nasopharyngeal Swab; Nasopharyngeal(NP) swabs in vial transport medium  Result Value Ref Range Status   SARS Coronavirus 2 by RT PCR POSITIVE (A) NEGATIVE Final    Comment: (NOTE) SARS-CoV-2 target nucleic acids are DETECTED.  The SARS-CoV-2 RNA is generally detectable in upper respiratory specimens during the acute phase of infection.  Positive results are indicative of the presence of the identified virus, but do not rule out bacterial infection or co-infection with other pathogens not detected by the test. Clinical correlation with patient history and other diagnostic information is necessary to determine patient infection status. The expected result is Negative.  Fact Sheet for Patients: EntrepreneurPulse.com.au  Fact Sheet for Healthcare Providers: IncredibleEmployment.be  This test is not yet approved or cleared by the [place] FDA and  has been authorized for detection and/or diagnosis of SARS-CoV-2 by FDA under an Emergency Use Authorization (EUA).  This EUA will remain in effect (meaning this test can be used) for the duration of  the COVID-19 declaration under Section 564(b)(1) of the A ct, 21 U.S.C. section 360bbb-3(b)(1), unless the authorization is terminated or revoked sooner.     Influenza A by PCR NEGATIVE NEGATIVE Final   Influenza B by PCR NEGATIVE NEGATIVE Final    Comment: (NOTE) The Xpert Xpress SARS-CoV-2/FLU/RSV plus assay is intended as an aid in the diagnosis of influenza from Nasopharyngeal swab specimens and should not be used as a sole basis for treatment. Nasal washings and aspirates are unacceptable for Xpert Xpress SARS-CoV-2/FLU/RSV testing.  Fact Sheet for Patients: EntrepreneurPulse.com.au  Fact Sheet for Healthcare Providers: IncredibleEmployment.be  This test is not yet approved or cleared by the [place] FDA and has been authorized for detection and/or diagnosis of SARS-CoV-2 by FDA under an Emergency Use Authorization (EUA). This EUA will remain in effect (meaning this test can be used) for the duration of the COVID-19 declaration under Section 564(b)(1) of the Act, 21 U.S.C. section 360bbb-3(b)(1), unless the authorization is terminated or revoked.  Performed at [facility], [place] [address] [place], [place] [address]       Radiology Studies: No results found.  Scheduled Meds:  buPROPion  150 mg Oral BID   enoxaparin (LOVENOX) injection  40 mg Subcutaneous Q24H   nirmatrelvir/ritonavir EUA  3 tablet Oral BID   pantoprazole (PROTONIX) IV  40 mg Intravenous Q12H   sucralfate  1 g Oral TID WC & HS   Continuous Infusions:  sodium chloride 100 mL/hr at 02/26/21 0600     LOS: 3 days   Marylu Lund, MD [facility] Pager On Amion  If 7PM-7AM, please contact  night-coverage 02/26/2021, 1:50 PM

## 2021-02-26 NOTE — Anesthesia Procedure Notes (Signed)
Procedure Name: MAC Date/Time: 02/26/2021 4:01 PM Performed by: Cynda Familia, CRNA Pre-anesthesia Checklist: Patient identified, Emergency Drugs available, Suction available, Patient being monitored and Timeout performed Patient Re-evaluated:Patient Re-evaluated prior to induction Oxygen Delivery Method: Simple face mask Placement Confirmation: positive ETCO2 and breath sounds checked- equal and bilateral Dental Injury: Teeth and Oropharynx as per pre-operative assessment

## 2021-02-27 ENCOUNTER — Encounter (HOSPITAL_COMMUNITY): Payer: Self-pay | Admitting: Gastroenterology

## 2021-02-27 ENCOUNTER — Inpatient Hospital Stay: Payer: Commercial Managed Care - PPO | Admitting: Licensed Clinical Social Worker

## 2021-02-27 ENCOUNTER — Encounter: Payer: Self-pay | Admitting: Internal Medicine

## 2021-02-27 ENCOUNTER — Other Ambulatory Visit (HOSPITAL_COMMUNITY): Payer: Self-pay

## 2021-02-27 LAB — COMPREHENSIVE METABOLIC PANEL
ALT: 32 U/L (ref 0–44)
AST: 26 U/L (ref 15–41)
Albumin: 3.2 g/dL — ABNORMAL LOW (ref 3.5–5.0)
Alkaline Phosphatase: 97 U/L (ref 38–126)
Anion gap: 11 (ref 5–15)
BUN: 8 mg/dL (ref 6–20)
CO2: 23 mmol/L (ref 22–32)
Calcium: 8.9 mg/dL (ref 8.9–10.3)
Chloride: 104 mmol/L (ref 98–111)
Creatinine, Ser: 0.55 mg/dL (ref 0.44–1.00)
GFR, Estimated: >60 mL/min (ref >60)
Glucose, Bld: 72 mg/dL (ref 70–99)
Potassium: 3.6 mmol/L (ref 3.5–5.1)
Sodium: 138 mmol/L (ref 135–145)
Total Bilirubin: 1 mg/dL (ref 0.3–1.2)
Total Protein: 6.3 g/dL — ABNORMAL LOW (ref 6.5–8.1)

## 2021-02-27 LAB — CULTURE, BLOOD (ROUTINE X 2)
Culture: NO GROWTH
Culture: NO GROWTH
Special Requests: ADEQUATE

## 2021-02-27 LAB — CBC
HCT: 25.6 % — ABNORMAL LOW (ref 36.0–46.0)
Hemoglobin: 8.9 g/dL — ABNORMAL LOW (ref 12.0–15.0)
MCH: 34.9 pg — ABNORMAL HIGH (ref 26.0–34.0)
MCHC: 34.8 g/dL (ref 30.0–36.0)
MCV: 100.4 fL — ABNORMAL HIGH (ref 80.0–100.0)
Platelets: 153 10*3/uL (ref 150–400)
RBC: 2.55 MIL/uL — ABNORMAL LOW (ref 3.87–5.11)
RDW: 18 % — ABNORMAL HIGH (ref 11.5–15.5)
WBC: 1.5 10*3/uL — ABNORMAL LOW (ref 4.0–10.5)
nRBC: 1.4 % — ABNORMAL HIGH (ref 0.0–0.2)

## 2021-02-27 MED ORDER — BENZONATATE 100 MG PO CAPS
100.0000 mg | ORAL_CAPSULE | Freq: Three times a day (TID) | ORAL | 0 refills | Status: DC | PRN
  Filled 2021-02-27: qty 20, 4d supply, fill #0

## 2021-02-27 MED ORDER — PANTOPRAZOLE SODIUM 40 MG PO TBEC
40.0000 mg | DELAYED_RELEASE_TABLET | Freq: Every day | ORAL | 0 refills | Status: DC
  Filled 2021-02-27: qty 30, 30d supply, fill #0

## 2021-02-27 MED ORDER — VALACYCLOVIR HCL 1 G PO TABS
1000.0000 mg | ORAL_TABLET | Freq: Two times a day (BID) | ORAL | 0 refills | Status: AC
  Filled 2021-02-27: qty 28, 14d supply, fill #0

## 2021-02-27 MED ORDER — VALACYCLOVIR HCL 500 MG PO TABS
1000.0000 mg | ORAL_TABLET | Freq: Two times a day (BID) | ORAL | Status: DC
  Administered 2021-02-27: 1000 mg via ORAL
  Filled 2021-02-27 (×2): qty 2

## 2021-02-27 MED ORDER — LIDOCAINE VISCOUS HCL 2 % MT SOLN
15.0000 mL | OROMUCOSAL | 0 refills | Status: DC | PRN
  Filled 2021-02-27: qty 100, 1d supply, fill #0

## 2021-02-27 NOTE — Discharge Summary (Signed)
Physician Discharge Summary  Julie Nicholson:[id number] DOB: 1965/07/03 DOA: 02/22/2021  PCP: [name], NP  Admit date: 02/22/2021 Discharge date: 02/27/2021  Admitted From: Home Disposition:  Home  Recommendations for Outpatient Follow-up:  Follow up with PCP in 1-2 weeks Follow up with GI as scheduled  Discharge Condition:Stable CODE STATUS:Full Diet recommendation: Full liquid, pt to advance on her own   Brief/Interim Summary: 56 y.o. female with a history of NSCLC recently on chemotherapy and radiation, hyperlipidemia, COPD, GERD. 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   Despite scheduled PPI, carafate, lidocaine, pt has been unable to advance past a clear diet   Discharge Diagnoses:  Principal Problem:   Radiation esophagitis Active Problems:   Primary adenocarcinoma of upper lobe of right lung (HCC)   GERD (gastroesophageal reflux disease)   Hyperlipidemia   Antineoplastic chemotherapy induced pancytopenia ([place])   COVID-19 virus infection  * Radiation esophagitis- (present on admission) -Seen by GI. Pt underwent EGD 1/26 with findings of single 1cm oval shape with very shallow ulceration with 5cm long segment of circumferential moderate inflammation -GI recs for BID valcyclovir x 2 weeks.  -Pt to f/u on viral cultures as outpt with GI   Primary adenocarcinoma of upper lobe of right lung ([place])- (present on admission) Patient follows with Dr. Julien Nordmann as an outpatient. Patient has completed course of chemotherapy/radiation therapy.    COVID-19 virus infection- (present on admission) No associated hypoxia or pneumonia. -Paxlovid was given in hospital, pt elected not to complete course -Stable from COVID standpoint   Antineoplastic chemotherapy induced pancytopenia (HCC) No evidence of bleeding; no hemoptysis or melena. -CBC trends remained stable   Hyperlipidemia Patient is on Lipitor as an outpatient which  was initially held secondary to NPO status -resume home meds on d/c   GERD (gastroesophageal reflux disease)- (present on admission) -Continue Protonix -Continue Carafate   Elevated LFTs-resolved as of 02/24/2021 Mild. In setting of COVID-19 infection. Now normalized    Discharge Instructions   Allergies as of 02/27/2021       Reactions   Benadryl [diphenhydramine] Other (See Comments)   Oversedation - 50 mg IV given 01/05/21; switched to IV Certirizine.        Medication List     STOP taking these medications    famotidine 20 MG tablet Commonly known as: PEPCID   Meloxicam Powd       TAKE these medications    acetaminophen 500 MG tablet Commonly known as: TYLENOL Take 1,000 mg by mouth every 6 (six) hours as needed for moderate pain.   albuterol 108 (90 Base) MCG/ACT inhaler Commonly known as: VENTOLIN HFA Inhale 1-2 puffs into the lungs every 6 (six) hours as needed for wheezing or shortness of breath.   aspirin EC 81 MG tablet Take 81 mg by mouth at bedtime.   atorvastatin 40 MG tablet Commonly known as: LIPITOR Take 40 mg by mouth at bedtime.   benzonatate 100 MG capsule Commonly known as: TESSALON Take 1-2 capsules (100-200 mg total) by mouth 3 (three) times daily as needed for cough.   Biotin 5000 MCG Tabs Take 5,000 mcg by mouth at bedtime.   buPROPion 150 MG 12 hr tablet Commonly known as: Wellbutrin SR Take 1 tablet (150 mg total) by mouth 2 (two) times daily.   fluticasone 50 MCG/ACT nasal spray Commonly known as: FLONASE Place 2 sprays into both nostrils daily as needed for allergies.   HAIR SKIN AND NAILS FORMULA PO Take 1  tablet by mouth daily.   lidocaine 2 % solution Commonly known as: XYLOCAINE Use as directed 15 mLs in the mouth or throat every 4 (four) hours as needed for mouth pain (throat pain). What changed:  how much to take how to take this when to take this reasons to take this additional instructions   Oxycodone  HCl 10 MG Tabs Take 1 tablet by mouth 3 (three) times daily as needed (pain).   pantoprazole 40 MG tablet Commonly known as: Protonix Take 1 tablet (40 mg total) by mouth daily.   prochlorperazine 10 MG tablet Commonly known as: COMPAZINE Take 1 tablet (10 mg total) by mouth every 6 (six) hours as needed. What changed: reasons to take this   ROLAIDS PO Take 1 tablet by mouth daily as needed (heartburn).   sucralfate 1 g tablet Commonly known as: Carafate Dissolve 1 tablet in 10 mL H20 and swallow 30 min prior to meals and bedtime.   Ubrelvy 100 MG Tabs Generic drug: Ubrogepant Take 100 mg by mouth daily as needed (migraine).   valACYclovir 1000 MG tablet Commonly known as: VALTREX Take 1 tablet (1,000 mg total) by mouth 2 (two) times daily for 14 days.        Follow-up Information     [name], NP Follow up in 2 week(s).   Why: Hospital follow up Contact information: [address] [place] [address] [phone]                Allergies  Allergen Reactions   Benadryl [Diphenhydramine] Other (See Comments)    Oversedation - 50 mg IV given 01/05/21; switched to IV Certirizine.    Consultations: GI  Procedures/Studies: CT Angio Chest PE W and/or Wo Contrast  Result Date: 02/22/2021 CLINICAL DATA:  Shortness of breath and chest pain. Clinical concern for pulmonary embolus. History of lung cancer with abdominal pain. Elevated bilirubin and diarrhea. EXAM: CT ANGIOGRAPHY CHEST CT ABDOMEN AND PELVIS WITH CONTRAST TECHNIQUE: Multidetector CT imaging of the chest was performed using the standard protocol during bolus administration of intravenous contrast. Multiplanar CT image reconstructions and MIPs were obtained to evaluate the vascular anatomy. Multidetector CT imaging of the abdomen and pelvis was performed using the standard protocol during bolus administration of intravenous contrast. RADIATION DOSE REDUCTION: This exam was performed  according to the departmental dose-optimization program which includes automated exposure control, adjustment of the mA and/or kV according to patient size and/or use of iterative reconstruction technique. CONTRAST:  78mL OMNIPAQUE IOHEXOL 350 MG/ML SOLN COMPARISON:  PET-CT 12/12/2020.  CTA chest 11/27/2020. FINDINGS: CTA CHEST FINDINGS Cardiovascular: The heart size is normal. No substantial pericardial effusion. No thoracic aortic aneurysm. There is no filling defect within the opacified pulmonary arteries to suggest the presence of an acute pulmonary embolus. Mediastinum/Nodes: Bulky right hilar lymphadenopathy seen on the previous study has decreased with right hilar lymph node measuring 14 mm short axis today compared to 24 mm previously. Subcarinal lymphadenopathy has also decreased measuring approximate 11 mm today compared to 17 mm previously. No left hilar lymphadenopathy. Possible mild wall thickening in the mid esophagus (image 38/2). There is no axillary lymphadenopathy. Lungs/Pleura: Centrilobular and paraseptal emphysema evident. Right upper lobe pulmonary nodule has decreased in the interval measuring 9 x 7 mm today on 52/7, decreased from 12 x 11 mm (remeasured) previously. No new suspicious nodule or mass. No focal airspace consolidation. No pleural effusion. Musculoskeletal: No worrisome lytic or sclerotic osseous abnormality. Review of the MIP images confirms the  above findings. CT ABDOMEN and PELVIS FINDINGS Hepatobiliary: No suspicious focal abnormality within the liver parenchyma. Gallbladder surgically absent. Common bile duct measures 8 mm diameter in the head of the pancreas increased from 5 mm previously. Pancreas: Prominence of the main pancreatic duct in the head of pancreas is similar to prior with main pancreatic duct measuring up to 4 mm diameter. Spleen: No splenomegaly. No focal mass lesion. Adrenals/Urinary Tract: No adrenal nodule or mass. 10 mm subcapsular lesion interpolar right  kidney has attenuation too high to be a simple cyst. 13 mm hypoattenuating lesion upper interpolar left kidney is likely a cyst. No evidence for hydroureter. The urinary bladder appears normal for the degree of distention. Stomach/Bowel: Stomach is unremarkable. No gastric wall thickening. No evidence of outlet obstruction. Duodenum is normally positioned as is the ligament of Treitz. No small bowel wall thickening. No small bowel dilatation. The terminal ileum is normal. The appendix is not well visualized, but there is no edema or inflammation in the region of the cecum. No gross colonic mass. No colonic wall thickening. Diverticular changes are noted in the left colon without evidence of diverticulitis. Vascular/Lymphatic: There is mild atherosclerotic calcification of the abdominal aorta without aneurysm. There is no gastrohepatic or hepatoduodenal ligament lymphadenopathy. No retroperitoneal or mesenteric lymphadenopathy. No pelvic sidewall lymphadenopathy. Reproductive: Unremarkable. Other: No intraperitoneal free fluid. Musculoskeletal: No worrisome lytic or sclerotic osseous abnormality. Review of the MIP images confirms the above findings. IMPRESSION: 1. No evidence for acute pulmonary embolus. 2. Interval decrease in size of the right upper lobe pulmonary nodule and bulky right hilar and subcarinal lymphadenopathy. 3. No evidence for metastatic disease in the abdomen or pelvis. 4. 10 mm subcapsular lesion interpolar right kidney has attenuation too high to be a simple cyst. While this may represent a cyst complicated by proteinaceous debris or hemorrhage, neoplasm could have a similar appearance. MRI of the abdomen with and without contrast recommended to further evaluate. 5. Prominence of the main pancreatic duct in the head of pancreas is similar to prior but slightly progressive. While likely related to prior surgery, given the reported history of elevated bilirubin, MRCP or ERCP may be warranted to  further evaluate. 6. Possible mild wall thickening in the mid esophagus. Esophagitis could have this appearance. 7. Aortic Atherosclerosis (ICD10-I70.0) and Emphysema (ICD10-J43.9). Electronically Signed   By: Misty Stanley M.D.   On: 02/22/2021 12:14   CT ABDOMEN PELVIS W CONTRAST  Result Date: 02/22/2021 CLINICAL DATA:  Shortness of breath and chest pain. Clinical concern for pulmonary embolus. History of lung cancer with abdominal pain. Elevated bilirubin and diarrhea. EXAM: CT ANGIOGRAPHY CHEST CT ABDOMEN AND PELVIS WITH CONTRAST TECHNIQUE: Multidetector CT imaging of the chest was performed using the standard protocol during bolus administration of intravenous contrast. Multiplanar CT image reconstructions and MIPs were obtained to evaluate the vascular anatomy. Multidetector CT imaging of the abdomen and pelvis was performed using the standard protocol during bolus administration of intravenous contrast. RADIATION DOSE REDUCTION: This exam was performed according to the departmental dose-optimization program which includes automated exposure control, adjustment of the mA and/or kV according to patient size and/or use of iterative reconstruction technique. CONTRAST:  55mL OMNIPAQUE IOHEXOL 350 MG/ML SOLN COMPARISON:  PET-CT 12/12/2020.  CTA chest 11/27/2020. FINDINGS: CTA CHEST FINDINGS Cardiovascular: The heart size is normal. No substantial pericardial effusion. No thoracic aortic aneurysm. There is no filling defect within the opacified pulmonary arteries to suggest the presence of an acute pulmonary embolus. Mediastinum/Nodes: Bulky right hilar  lymphadenopathy seen on the previous study has decreased with right hilar lymph node measuring 14 mm short axis today compared to 24 mm previously. Subcarinal lymphadenopathy has also decreased measuring approximate 11 mm today compared to 17 mm previously. No left hilar lymphadenopathy. Possible mild wall thickening in the mid esophagus (image 38/2). There is no  axillary lymphadenopathy. Lungs/Pleura: Centrilobular and paraseptal emphysema evident. Right upper lobe pulmonary nodule has decreased in the interval measuring 9 x 7 mm today on 52/7, decreased from 12 x 11 mm (remeasured) previously. No new suspicious nodule or mass. No focal airspace consolidation. No pleural effusion. Musculoskeletal: No worrisome lytic or sclerotic osseous abnormality. Review of the MIP images confirms the above findings. CT ABDOMEN and PELVIS FINDINGS Hepatobiliary: No suspicious focal abnormality within the liver parenchyma. Gallbladder surgically absent. Common bile duct measures 8 mm diameter in the head of the pancreas increased from 5 mm previously. Pancreas: Prominence of the main pancreatic duct in the head of pancreas is similar to prior with main pancreatic duct measuring up to 4 mm diameter. Spleen: No splenomegaly. No focal mass lesion. Adrenals/Urinary Tract: No adrenal nodule or mass. 10 mm subcapsular lesion interpolar right kidney has attenuation too high to be a simple cyst. 13 mm hypoattenuating lesion upper interpolar left kidney is likely a cyst. No evidence for hydroureter. The urinary bladder appears normal for the degree of distention. Stomach/Bowel: Stomach is unremarkable. No gastric wall thickening. No evidence of outlet obstruction. Duodenum is normally positioned as is the ligament of Treitz. No small bowel wall thickening. No small bowel dilatation. The terminal ileum is normal. The appendix is not well visualized, but there is no edema or inflammation in the region of the cecum. No gross colonic mass. No colonic wall thickening. Diverticular changes are noted in the left colon without evidence of diverticulitis. Vascular/Lymphatic: There is mild atherosclerotic calcification of the abdominal aorta without aneurysm. There is no gastrohepatic or hepatoduodenal ligament lymphadenopathy. No retroperitoneal or mesenteric lymphadenopathy. No pelvic sidewall  lymphadenopathy. Reproductive: Unremarkable. Other: No intraperitoneal free fluid. Musculoskeletal: No worrisome lytic or sclerotic osseous abnormality. Review of the MIP images confirms the above findings. IMPRESSION: 1. No evidence for acute pulmonary embolus. 2. Interval decrease in size of the right upper lobe pulmonary nodule and bulky right hilar and subcarinal lymphadenopathy. 3. No evidence for metastatic disease in the abdomen or pelvis. 4. 10 mm subcapsular lesion interpolar right kidney has attenuation too high to be a simple cyst. While this may represent a cyst complicated by proteinaceous debris or hemorrhage, neoplasm could have a similar appearance. MRI of the abdomen with and without contrast recommended to further evaluate. 5. Prominence of the main pancreatic duct in the head of pancreas is similar to prior but slightly progressive. While likely related to prior surgery, given the reported history of elevated bilirubin, MRCP or ERCP may be warranted to further evaluate. 6. Possible mild wall thickening in the mid esophagus. Esophagitis could have this appearance. 7. Aortic Atherosclerosis (ICD10-I70.0) and Emphysema (ICD10-J43.9). Electronically Signed   By: Misty Stanley M.D.   On: 02/22/2021 12:14   DG Chest Portable 1 View  Result Date: 02/22/2021 CLINICAL DATA:  Shortness of breath EXAM: PORTABLE CHEST 1 VIEW COMPARISON:  Chest x-ray 12/15/2020 FINDINGS: Heart size and mediastinal contours are within normal limits. No suspicious pulmonary opacities identified. No pleural effusion or pneumothorax visualized. No acute osseous abnormality appreciated. IMPRESSION: No acute intrathoracic process identified. Electronically Signed   By: Ofilia Neas M.D.   On:  02/22/2021 10:32    Subjective: Very eager to go home  Discharge Exam: Vitals:   02/27/21 0505 02/27/21 0506  BP: 129/81 129/81  Pulse: 94 (!) 102  Resp: 18 18  Temp: (!) 97.4 F (36.3 C) (!) 97.4 F (36.3 C)  SpO2: 98%  98%   Vitals:   02/26/21 2149 02/26/21 2150 02/27/21 0505 02/27/21 0506  BP: 133/86 133/86 129/81 129/81  Pulse: (!) 104 (!) 106 94 (!) 102  Resp: 18 18 18 18   Temp: 97.8 F (36.6 C) 97.8 F (36.6 C) (!) 97.4 F (36.3 C) (!) 97.4 F (36.3 C)  TempSrc:      SpO2: 97% 98% 98% 98%  Weight:      Height:        General: Pt is alert, awake, not in acute distress Cardiovascular: RRR, S1/S2 + Respiratory: CTA bilaterally, no wheezing, no rhonchi Abdominal: Soft, NT, ND, bowel sounds + Extremities: no edema, no cyanosis   The results of significant diagnostics from this hospitalization (including imaging, microbiology, ancillary and laboratory) are listed below for reference.     Microbiology: Recent Results (from the past 240 hour(s))  Group A Strep by PCR     Status: None   Collection Time: 02/22/21  9:45 AM   Specimen: Sterile Swab  Result Value Ref Range Status   Group A Strep by PCR NOT DETECTED NOT DETECTED Final    Comment: Performed at [facility], [place] [address], [place], [place] [address]  Blood culture (routine x 2)     Status: None   Collection Time: 02/22/21 10:02 AM   Specimen: BLOOD  Result Value Ref Range Status   Specimen Description   Final    BLOOD RIGHT ANTECUBITAL Performed at [facility] [address], [place], [place] [address]    Special Requests   Final    BOTTLES DRAWN AEROBIC AND ANAEROBIC Blood Culture adequate volume Performed at [facility] [address], [place], [place] [address]    Culture   Final    NO GROWTH 5 DAYS Performed at [facility], [place] [address], [place], [place] [address]    Report Status 02/27/2021 FINAL  Final  Blood culture (routine x 2)     Status: None   Collection Time: 02/22/21 10:02 AM   Specimen: BLOOD  Result Value Ref [place] Status   Specimen Description   Final    BLOOD LEFT ANTECUBITAL Performed at [facility]  [address] [place], [place] [address]    Special Requests   Final    BOTTLES DRAWN AEROBIC AND ANAEROBIC Blood Culture results may not be optimal due to an excessive volume of blood received in culture bottles Performed at [facility] [address], [place],  [address]    Culture   Final    NO GROWTH 5 DAYS Performed at [facility], [place] [address], [place],  [address]    Report Status 02/27/2021 FINAL  Final  Resp Panel by RT-PCR (Flu A&B, Covid) Nasopharyngeal Swab     Status: Abnormal   Collection Time: 02/22/21 11:57 AM   Specimen: Nasopharyngeal Swab; Nasopharyngeal(NP) swabs in vial transport medium  Result Value Ref Range Status   SARS Coronavirus 2 by RT PCR POSITIVE (A) NEGATIVE Final    Comment: (NOTE) SARS-CoV-2 target nucleic acids are DETECTED.  The SARS-CoV-2 RNA is generally detectable in upper respiratory specimens during the acute phase of infection.  Positive results are indicative of the presence of the identified virus, but do not rule out bacterial infection or co-infection with other pathogens not detected by the test. Clinical correlation with patient history and other diagnostic information is necessary to determine patient infection status. The expected result is Negative.  Fact Sheet for Patients: EntrepreneurPulse.com.au  Fact Sheet for Healthcare Providers: IncredibleEmployment.be  This test is not yet approved or cleared by the [place] FDA and  has been authorized for detection and/or diagnosis of SARS-CoV-2 by FDA under an Emergency Use Authorization (EUA).  This EUA will remain in effect (meaning this test can be used) for the duration of  the COVID-19 declaration under Section 564(b)(1) of the A ct, 21 U.S.C. section 360bbb-3(b)(1), unless the authorization is terminated or revoked sooner.     Influenza A by PCR NEGATIVE NEGATIVE Final   Influenza B by PCR  NEGATIVE NEGATIVE Final    Comment: (NOTE) The Xpert Xpress SARS-CoV-2/FLU/RSV plus assay is intended as an aid in the diagnosis of influenza from Nasopharyngeal swab specimens and should not be used as a sole basis for treatment. Nasal washings and aspirates are unacceptable for Xpert Xpress SARS-CoV-2/FLU/RSV testing.  Fact Sheet for Patients: EntrepreneurPulse.com.au  Fact Sheet for Healthcare Providers: IncredibleEmployment.be  This test is not yet approved or cleared by the [place] FDA and has been authorized for detection and/or diagnosis of SARS-CoV-2 by FDA under an Emergency Use Authorization (EUA). This EUA will remain in effect (meaning this test can be used) for the duration of the COVID-19 declaration under Section 564(b)(1) of the Act, 21 U.S.C. section 360bbb-3(b)(1), unless the authorization is terminated or revoked.  Performed at [facility], [place] [address], [place], [place] [address]      Labs: BNP (last 3 results) No results for input(s): BNP in the last 8760 hours. Basic Metabolic Panel: Recent Labs  Lab 02/22/21 1001 02/23/21 0414 02/24/21 0709 02/26/21 0404 02/27/21 0436  NA 142 136 136 137 138  K 4.0 3.5 3.1* 3.7 3.6  CL 100 107 105 104 104  CO2 25 20* 22 23 23   GLUCOSE 100* 68* 63* 73 72  BUN 12 9 6 6 8   CREATININE 0.69 0.50 0.55 0.55 0.55  CALCIUM 10.0 8.0* 8.0* 8.6* 8.9  MG  --   --  1.4*  --   --    Liver Function Tests: Recent Labs  Lab 02/22/21 1001 02/23/21 0414 02/24/21 0709 02/26/21 0404 02/27/21 0436  AST 19 51* 29 22 26   ALT 22 54* 38 32 32  ALKPHOS 95 138* 116 105 97  BILITOT 1.5* 1.3* 1.1 1.1 1.0  PROT 8.1 5.8* 6.0* 6.2* 6.3*  ALBUMIN 4.1 2.9* 3.0* 3.3* 3.2*   Recent Labs  Lab 02/22/21 1001  LIPASE 39   No results for input(s): AMMONIA in the last 168 hours. CBC: Recent Labs  Lab 02/22/21 1001 02/23/21 0414 02/24/21 0709 02/24/21 1656  02/25/21 0407 02/26/21 0404 02/27/21 0436  WBC 1.9* 1.5* 1.6*  --  1.8* 1.5* 1.5*  NEUTROABS 1.4*  --  1.1*  --   --   --   --   HGB 10.6* 8.1* 7.0* 8.6* 8.4* 8.6* 8.9*  HCT 30.5* 23.1* 19.6* 23.4* 24.0* 24.9* 25.6*  MCV 97.1 98.3 97.0  --  99.2 99.6 100.4*  PLT 153 146* 148*  --  144* 150 153   Cardiac Enzymes: No results for input(s): CKTOTAL, CKMB, CKMBINDEX, TROPONINI in the last 168 hours. BNP: Invalid input(s):  POCBNP CBG: No results for input(s): GLUCAP in the last 168 hours. D-Dimer No results for input(s): DDIMER in the last 72 hours. Hgb A1c No results for input(s): HGBA1C in the last 72 hours. Lipid Profile No results for input(s): CHOL, HDL, LDLCALC, TRIG, CHOLHDL, LDLDIRECT in the last 72 hours. Thyroid function studies No results for input(s): TSH, T4TOTAL, T3FREE, THYROIDAB in the last 72 hours.  Invalid input(s): FREET3 Anemia work up No results for input(s): VITAMINB12, FOLATE, FERRITIN, TIBC, IRON, RETICCTPCT in the last 72 hours. Urinalysis    Component Value Date/Time   COLORURINE YELLOW 02/22/2021 1334   APPEARANCEUR CLEAR 02/22/2021 1334   LABSPEC >1.046 (H) 02/22/2021 1334   PHURINE 6.0 02/22/2021 1334   GLUCOSEU NEGATIVE 02/22/2021 1334   GLUCOSEU NEGATIVE 03/01/2013 0733   HGBUR NEGATIVE 02/22/2021 1334   BILIRUBINUR NEGATIVE 02/22/2021 1334   KETONESUR 20 (A) 02/22/2021 1334   PROTEINUR NEGATIVE 02/22/2021 1334   UROBILINOGEN 0.2 03/01/2013 0733   NITRITE NEGATIVE 02/22/2021 1334   LEUKOCYTESUR NEGATIVE 02/22/2021 1334   Sepsis Labs Invalid input(s): PROCALCITONIN,  WBC,  LACTICIDVEN Microbiology Recent Results (from the past 240 hour(s))  Group A Strep by PCR     Status: None   Collection Time: 02/22/21  9:45 AM   Specimen: Sterile Swab  Result Value Ref [place] Status   Group A Strep by PCR NOT DETECTED NOT DETECTED Final    Comment: Performed at [facility], [place] [address], [place], [place] [address]  Blood culture  (routine x 2)     Status: None   Collection Time: 02/22/21 10:02 AM   Specimen: BLOOD  Result Value Ref [place] Status   Specimen Description   Final    BLOOD RIGHT ANTECUBITAL Performed at [facility] [address], [place], [place] [address]    Special Requests   Final    BOTTLES DRAWN AEROBIC AND ANAEROBIC Blood Culture adequate volume Performed at [facility] [address], [place], [place] [address]    Culture   Final    NO GROWTH 5 DAYS Performed at [facility], [place] [address], [place], [place] [address]    Report Status 02/27/2021 FINAL  Final  Blood culture (routine x 2)     Status: None   Collection Time: 02/22/21 10:02 AM   Specimen: BLOOD  Result Value Ref [place] Status   Specimen Description   Final    BLOOD LEFT ANTECUBITAL Performed at [facility] [address], [place], [place] [address]    Special Requests   Final    BOTTLES DRAWN AEROBIC AND ANAEROBIC Blood Culture results may not be optimal due to an excessive volume of blood received in culture bottles Performed at [facility] [address] [place], [place] [address]    Culture   Final    NO GROWTH 5 DAYS Performed at [facility], [facility] [address] [place],  [address]    Report Status 02/27/2021 FINAL  Final  Resp Panel by RT-PCR (Flu A&B, Covid) Nasopharyngeal Swab     Status: Abnormal   Collection Time: 02/22/21 11:57 AM   Specimen: Nasopharyngeal Swab; Nasopharyngeal(NP) swabs in vial transport medium  Result Value Ref Range Status   SARS Coronavirus 2 by RT PCR POSITIVE (A) NEGATIVE Final    Comment: (NOTE) SARS-CoV-2 target nucleic acids are DETECTED.  The SARS-CoV-2 RNA is generally detectable in upper respiratory specimens during the acute phase of infection. Positive  results are indicative of the presence of the identified virus, but do not rule out bacterial infection or co-infection with  other pathogens not detected by the test. Clinical correlation with patient history and other diagnostic information is necessary to determine patient infection status. The expected result is Negative.  Fact Sheet for Patients: EntrepreneurPulse.com.au  Fact Sheet for Healthcare Providers: IncredibleEmployment.be  This test is not yet approved or cleared by the [place] FDA and  has been authorized for detection and/or diagnosis of SARS-CoV-2 by FDA under an Emergency Use Authorization (EUA).  This EUA will remain in effect (meaning this test can be used) for the duration of  the COVID-19 declaration under Section 564(b)(1) of the A ct, 21 U.S.C. section 360bbb-3(b)(1), unless the authorization is terminated or revoked sooner.     Influenza A by PCR NEGATIVE NEGATIVE Final   Influenza B by PCR NEGATIVE NEGATIVE Final    Comment: (NOTE) The Xpert Xpress SARS-CoV-2/FLU/RSV plus assay is intended as an aid in the diagnosis of influenza from Nasopharyngeal swab specimens and should not be used as a sole basis for treatment. Nasal washings and aspirates are unacceptable for Xpert Xpress SARS-CoV-2/FLU/RSV testing.  Fact Sheet for Patients: EntrepreneurPulse.com.au  Fact Sheet for Healthcare Providers: IncredibleEmployment.be  This test is not yet approved or cleared by the [place] FDA and has been authorized for detection and/or diagnosis of SARS-CoV-2 by FDA under an Emergency Use Authorization (EUA). This EUA will remain in effect (meaning this test can be used) for the duration of the COVID-19 declaration under Section 564(b)(1) of the Act, 21 U.S.C. section 360bbb-3(b)(1), unless the authorization is terminated or revoked.  Performed at [facility], [place] [address], [place], [place] [address]    Time spent: 60min  SIGNED:   Marylu Lund, MD  [facility] 02/27/2021, 10:48 AM  If 7PM-7AM, please contact night-coverage

## 2021-02-27 NOTE — Anesthesia Postprocedure Evaluation (Signed)
Anesthesia Post Note  Patient: Julie Nicholson  Procedure(s) Performed: ESOPHAGOGASTRODUODENOSCOPY (EGD) WITH PROPOFOL BIOPSY     Patient location during evaluation: PACU Anesthesia Type: MAC Level of consciousness: awake and alert Pain management: pain level controlled Vital Signs Assessment: post-procedure vital signs reviewed and stable Respiratory status: spontaneous breathing, nonlabored ventilation and respiratory function stable Cardiovascular status: stable and blood pressure returned to baseline Anesthetic complications: no   No notable events documented.  Last Vitals:  Vitals:   02/26/21 2149 02/26/21 2150  BP: 133/86 133/86  Pulse: (!) 104 (!) 106  Resp: 18 18  Temp: 36.6 C 36.6 C  SpO2: 97% 98%    Last Pain:  Vitals:   02/26/21 2223  TempSrc:   PainSc: Pomfret Levon Boettcher

## 2021-03-05 ENCOUNTER — Telehealth: Payer: Self-pay | Admitting: Internal Medicine

## 2021-03-05 NOTE — Telephone Encounter (Signed)
Scheduled per 01/09 los, patient has been called and notified.

## 2021-03-09 LAB — VIRUS CULTURE

## 2021-03-11 ENCOUNTER — Other Ambulatory Visit: Payer: Self-pay | Admitting: Physician Assistant

## 2021-03-11 DIAGNOSIS — C3411 Malignant neoplasm of upper lobe, right bronchus or lung: Secondary | ICD-10-CM

## 2021-03-12 ENCOUNTER — Ambulatory Visit (HOSPITAL_COMMUNITY)
Admission: RE | Admit: 2021-03-12 | Discharge: 2021-03-12 | Disposition: A | Discharge status: Home / Self Care | Payer: Commercial Managed Care - PPO | Source: Ambulatory Visit | Attending: Internal Medicine | Admitting: Internal Medicine

## 2021-03-12 ENCOUNTER — Inpatient Hospital Stay: Payer: Commercial Managed Care - PPO | Attending: Internal Medicine

## 2021-03-12 ENCOUNTER — Other Ambulatory Visit: Payer: Self-pay

## 2021-03-12 DIAGNOSIS — Z5112 Encounter for antineoplastic immunotherapy: Secondary | ICD-10-CM | POA: Insufficient documentation

## 2021-03-12 DIAGNOSIS — C3411 Malignant neoplasm of upper lobe, right bronchus or lung: Secondary | ICD-10-CM | POA: Insufficient documentation

## 2021-03-12 DIAGNOSIS — C3490 Malignant neoplasm of unspecified part of unspecified bronchus or lung: Secondary | ICD-10-CM | POA: Diagnosis not present

## 2021-03-12 LAB — CMP (CANCER CENTER ONLY)
ALT: 23 U/L (ref 0–44)
AST: 18 U/L (ref 15–41)
Albumin: 4 g/dL (ref 3.5–5.0)
Alkaline Phosphatase: 85 U/L (ref 38–126)
Anion gap: 5 (ref 5–15)
BUN: 8 mg/dL (ref 6–20)
CO2: 30 mmol/L (ref 22–32)
Calcium: 9.4 mg/dL (ref 8.9–10.3)
Chloride: 105 mmol/L (ref 98–111)
Creatinine: 0.62 mg/dL (ref 0.44–1.00)
GFR, Estimated: >60 mL/min (ref >60)
Glucose, Bld: 92 mg/dL (ref 70–99)
Potassium: 3.8 mmol/L (ref 3.5–5.1)
Sodium: 140 mmol/L (ref 135–145)
Total Bilirubin: 0.6 mg/dL (ref 0.3–1.2)
Total Protein: 6.9 g/dL (ref 6.5–8.1)

## 2021-03-12 LAB — CBC WITH DIFFERENTIAL (CANCER CENTER ONLY)
Abs Immature Granulocytes: 0.01 10*3/uL (ref 0.00–0.07)
Basophils Absolute: 0 10*3/uL (ref 0.0–0.1)
Basophils Relative: 0 %
Eosinophils Absolute: 0 10*3/uL (ref 0.0–0.5)
Eosinophils Relative: 1 %
HCT: 29.7 % — ABNORMAL LOW (ref 36.0–46.0)
Hemoglobin: 10.5 g/dL — ABNORMAL LOW (ref 12.0–15.0)
Immature Granulocytes: 0 %
Lymphocytes Relative: 18 %
Lymphs Abs: 0.5 10*3/uL — ABNORMAL LOW (ref 0.7–4.0)
MCH: 36.1 pg — ABNORMAL HIGH (ref 26.0–34.0)
MCHC: 35.4 g/dL (ref 30.0–36.0)
MCV: 102.1 fL — ABNORMAL HIGH (ref 80.0–100.0)
Monocytes Absolute: 0.4 10*3/uL (ref 0.1–1.0)
Monocytes Relative: 12 %
Neutro Abs: 1.9 10*3/uL (ref 1.7–7.7)
Neutrophils Relative %: 69 %
Platelet Count: 138 10*3/uL — ABNORMAL LOW (ref 150–400)
RBC: 2.91 MIL/uL — ABNORMAL LOW (ref 3.87–5.11)
RDW: 19.2 % — ABNORMAL HIGH (ref 11.5–15.5)
WBC Count: 2.8 10*3/uL — ABNORMAL LOW (ref 4.0–10.5)
nRBC: 0 % (ref 0.0–0.2)

## 2021-03-12 MED ORDER — IOHEXOL 300 MG/ML  SOLN
75.0000 mL | Freq: Once | INTRAMUSCULAR | Status: AC | PRN
  Administered 2021-03-12: 75 mL via INTRAVENOUS

## 2021-03-16 ENCOUNTER — Encounter: Payer: Self-pay | Admitting: Radiology

## 2021-03-17 ENCOUNTER — Inpatient Hospital Stay: Payer: Commercial Managed Care - PPO | Admitting: Internal Medicine

## 2021-03-18 ENCOUNTER — Other Ambulatory Visit: Payer: Self-pay

## 2021-03-18 ENCOUNTER — Inpatient Hospital Stay (HOSPITAL_BASED_OUTPATIENT_CLINIC_OR_DEPARTMENT_OTHER): Payer: Commercial Managed Care - PPO | Admitting: Internal Medicine

## 2021-03-18 VITALS — BP 119/62 | HR 99 | Temp 98.4°F | Resp 18 | Ht 61.0 in | Wt 101.1 lb

## 2021-03-18 DIAGNOSIS — Z5112 Encounter for antineoplastic immunotherapy: Secondary | ICD-10-CM

## 2021-03-18 DIAGNOSIS — Z7189 Other specified counseling: Secondary | ICD-10-CM

## 2021-03-18 DIAGNOSIS — C3411 Malignant neoplasm of upper lobe, right bronchus or lung: Secondary | ICD-10-CM | POA: Diagnosis not present

## 2021-03-18 HISTORY — DX: Other specified counseling: Z71.89

## 2021-03-18 NOTE — Progress Notes (Signed)
Julie [facility]olson [phone]   Fax:([phone]  OFFICE PROGRESS NOTE  Julie Beals, NP [place] [address]  DIAGNOSIS: Stage IIIB  (T1b, N3, M0) non-small cell lung cancer, adenocarcinoma she presented with right upper lobe nodule in addition to bulky right hilar, mediastinal, and left supraclavicular lymphadenopathy  DETECTED ALTERATION(S) / BIOMARKER(S) % CFDNA OR AMPLIFICATION ASSOCIATED FDA-APPROVED THERAPIES CLINICAL TRIAL AVAILABILITY [id number] ND 0.5 5 50 100 4.7%  [id number] ND 0.5 5 50 100 1.8%  [id number] ND 0.5 5 50 100 1.9%  BIOMARKER ADDITIONAL DETAILS Tumor Mutational Burden (TMB) 19.02 mut/Mb MSI Status Stable (MSS) PD-L1 Tumor Proportion Score (TPS)* <1%   PRIOR THERAPY: Concurrent chemoradiation with carboplatin for an AUC of 2 and paclitaxel 45 mg per metered squared.  First dose on 01/05/2021.  Status post 7 cycles of treatment.  Last dose was given February 16, 2021.   CURRENT THERAPY: Consolidation immunotherapy with Imfinzi 1500 Mg IV every 4 weeks.  First dose March 25, 2021.  INTERVAL HISTORY: Julie [facility] 56 y.o. female returns to the clinic today for follow-up visit accompanied by her husband.  Her daughter was available by phone during the visit.  The patient is feeling much better today but continues to have dysphagia especially to solid food.  She was admitted to the hospital recently with radiation-induced esophagitis and she was seen by gastroenterologist, Dr. Ardis Hughs.  She had upper endoscopy performed on February 26, 2021 and it showed single 1.0 cm oval shaped very shallow ulceration versus gastric inlet patch in the very proximal esophagus.  There was also a 5 cm long segment of circumferential moderate inflammation located in the mid esophagus with the proximal edge at 28 cm from the incisors due to radiation.  She was treated empirically with ganciclovir 1 g twice daily for 2  weeks.  She denied having any current chest pain, shortness of breath, cough or hemoptysis.  She has no nausea, vomiting, diarrhea or constipation.  She has no headache or visual changes.  She had repeat CT scan of the chest performed recently and she is here for evaluation and discussion of her scan results and treatment options.  MEDICAL HISTORY: Past Medical History:  Diagnosis Date   Abdominal discomfort    Cancer ([place])    Chronic headaches    due to allergies, sinus   COPD (chronic obstructive pulmonary disease) ([place])    per 2012 chest xray   pt states she doesn not have this now (04/10/2013)   Deviated nasal septum    Eustachian tube dysfunction    GERD (gastroesophageal reflux disease)    occasional uses Tums / Rolaids   Hearing loss    right ear   High cholesterol    History of radiation therapy    right lung 01/07/2021-02/19/2021  Dr Gery Pray   Migraine    "only once in a blue moon since RX'd allergy shots" (04/10/2013)   Pancreatitis 02/08/2013   Pneumonia    Rhinitis, allergic     ALLERGIES:  is allergic to benadryl [diphenhydramine].  MEDICATIONS:  Current Outpatient Medications  Medication Sig Dispense Refill   acetaminophen (TYLENOL) 500 MG tablet Take 1,000 mg by mouth every 6 (six) hours as needed for moderate pain.     albuterol (VENTOLIN HFA) 108 (90 Base) MCG/ACT inhaler Inhale 1-2 puffs into the lungs every 6 (six) hours as needed for wheezing or shortness of breath. (Patient not taking: Reported on 02/22/2021) 6.7 g  0   aspirin EC 81 MG tablet Take 81 mg by mouth at bedtime.     atorvastatin (LIPITOR) 40 MG tablet Take 40 mg by mouth at bedtime.     benzonatate (TESSALON) 100 MG capsule Take 1-2 capsules (100-200 mg total) by mouth 3 (three) times daily as needed for cough. 20 capsule 0   Biotin 5000 MCG TABS Take 5,000 mcg by mouth at bedtime.     buPROPion (WELLBUTRIN SR) 150 MG 12 hr tablet Take 1 tablet (150 mg total) by mouth 2 (two) times daily. 60  tablet 5   Ca Carbonate-Mag Hydroxide (ROLAIDS PO) Take 1 tablet by mouth daily as needed (heartburn).     fluticasone (FLONASE) 50 MCG/ACT nasal spray Place 2 sprays into both nostrils daily as needed for allergies.     lidocaine (XYLOCAINE) 2 % solution Swish/gargle as directed 15 mLs in the mouth or throat every 4 (four) hours as needed for mouth pain (throat pain). 100 mL 0   Multiple Vitamins-Minerals (HAIR SKIN AND NAILS FORMULA PO) Take 1 tablet by mouth daily.     Oxycodone HCl 10 MG TABS Take 1 tablet by mouth 3 (three) times daily as needed (pain).     pantoprazole (PROTONIX) 40 MG tablet Take 1 tablet (40 mg total) by mouth daily. 30 tablet 0   prochlorperazine (COMPAZINE) 10 MG tablet Take 1 tablet (10 mg total) by mouth every 6 (six) hours as needed. (Patient taking differently: Take 10 mg by mouth every 6 (six) hours as needed for nausea.) 30 tablet 2   sucralfate (CARAFATE) 1 g tablet Dissolve 1 tablet in 10 mL H20 and swallow 30 min prior to meals and bedtime. 90 tablet 0   UBRELVY 100 MG TABS Take 100 mg by mouth daily as needed (migraine).     No current facility-administered medications for this visit.    SURGICAL HISTORY:  Past Surgical History:  Procedure Laterality Date   ABDOMINAL HYSTERECTOMY  1995   tx endometriosis, both ovaries removed   APPENDECTOMY  late 1990's   BIOPSY  02/26/2021   Procedure: BIOPSY;  Surgeon: Milus Banister, MD;  Location: [facility];  Service: Endoscopy;;   BRONCHIAL BRUSHINGS  12/15/2020   Procedure: BRONCHIAL BRUSHINGS;  Surgeon: Collene Gobble, MD;  Location: [facility];  Service: Cardiopulmonary;;   BRONCHIAL NEEDLE ASPIRATION BIOPSY  12/15/2020   Procedure: BRONCHIAL NEEDLE ASPIRATION BIOPSIES;  Surgeon: Collene Gobble, MD;  Location: [facility];  Service: Cardiopulmonary;;   CHOLECYSTECTOMY  04/10/2013   CHOLECYSTECTOMY N/A 04/10/2013   Procedure: LAPAROSCOPIC CHOLECYSTECTOMY WITH INTRAOPERATIVE CHOLANGIOGRAM;  Surgeon: Odis Hollingshead, MD;  Location: [facility];  Service: General;  Laterality: N/A;   ELECTROMAGNETIC NAVIGATION BROCHOSCOPY  12/15/2020   Procedure: ELECTROMAGNETIC NAVIGATION BRONCHOSCOPY;  Surgeon: Collene Gobble, MD;  Location: [facility];  Service: Cardiopulmonary;;   ESOPHAGOGASTRODUODENOSCOPY (EGD) WITH PROPOFOL N/A 02/26/2021   Procedure: ESOPHAGOGASTRODUODENOSCOPY (EGD) WITH PROPOFOL;  Surgeon: Milus Banister, MD;  Location: [facility];  Service: Endoscopy;  Laterality: N/A;   EUS N/A 02/16/2013   Procedure: UPPER ENDOSCOPIC ULTRASOUND (EUS) LINEAR;  Surgeon: Milus Banister, MD;  Location: [facility];  Service: Endoscopy;  Laterality: N/A;  radial linear   KNEE ARTHROSCOPY Right 1980's   "cartilage OR"   LAPAROSCOPIC ENDOMETRIOSIS FULGURATION  1980's   MYRINGOTOMY WITH TUBE PLACEMENT Right 07/13/2018   Procedure: MYRINGOTOMY WITH TUBE PLACEMENT;  Surgeon: Margaretha Sheffield, MD;  Location: [facility];  Service: ENT;  Laterality: Right;  NASOPHARYNGOSCOPY EUSTATION TUBE BALLOON DILATION Right 07/13/2018   Procedure: NASOPHARYNGOSCOPY EUSTATION TUBE BALLOON DILATION;  Surgeon: Margaretha Sheffield, MD;  Location: [facility];  Service: ENT;  Laterality: Right;   TONSILLECTOMY AND ADENOIDECTOMY  ~ 1980   adenoidectomy   TUBAL LIGATION  ~ [place] Right 07/13/2018   Procedure: OUTFRACTURE TURBINATE;  Surgeon: Margaretha Sheffield, MD;  Location: [facility];  Service: ENT;  Laterality: Right;   VIDEO BRONCHOSCOPY WITH ENDOBRONCHIAL ULTRASOUND N/A 12/15/2020   Procedure: ROBOTIC VIDEO BRONCHOSCOPY WITH ENDOBRONCHIAL ULTRASOUND;  Surgeon: Collene Gobble, MD;  Location: [facility];  Service: Cardiopulmonary;  Laterality: N/A;   WRIST SURGERY Left    w/plate    REVIEW OF SYSTEMS:  Constitutional: positive for fatigue Eyes: negative Ears, nose, mouth, throat, and face: negative Respiratory: negative Cardiovascular: negative Gastrointestinal: positive for  dysphagia Genitourinary:negative Integument/breast: negative Hematologic/lymphatic: negative Musculoskeletal:negative Neurological: negative Behavioral/Psych: negative Endocrine: negative Allergic/Immunologic: negative   PHYSICAL EXAMINATION: General appearance: alert, cooperative, fatigued, and no distress Head: Normocephalic, without obvious abnormality, atraumatic Neck: no adenopathy, no JVD, supple, symmetrical, trachea midline, and thyroid not enlarged, symmetric, no tenderness/mass/nodules Lymph nodes: Cervical, supraclavicular, and axillary nodes normal. Resp: clear to auscultation bilaterally Back: symmetric, no curvature. ROM normal. No CVA tenderness. Cardio: regular rate and rhythm, S1, S2 normal, no murmur, click, rub or gallop GI: soft, non-tender; bowel sounds normal; no masses,  no organomegaly Extremities: extremities normal, atraumatic, no cyanosis or edema Neurologic: Alert and oriented X 3, normal strength and tone. Normal symmetric reflexes. Normal coordination and gait  ECOG PERFORMANCE STATUS: 1 - Symptomatic but completely ambulatory  Blood pressure 119/62, pulse 99, temperature 98.4 F (36.9 C), temperature source Temporal, resp. rate 18, height _0  (1.549 m), weight 101 lb 1.6 oz (45.9 kg), SpO2 100 %.  LABORATORY DATA: Lab Results  Component Value Date   WBC 2.8 (L) 03/12/2021   HGB 10.5 (L) 03/12/2021   HCT 29.7 (L) 03/12/2021   MCV 102.1 (H) 03/12/2021   PLT 138 (L) 03/12/2021      Chemistry      Component Value Date/Time   NA 140 03/12/2021 1104   K 3.8 03/12/2021 1104   CL 105 03/12/2021 1104   CO2 30 03/12/2021 1104   BUN 8 03/12/2021 1104   CREATININE 0.62 03/12/2021 1104      Component Value Date/Time   CALCIUM 9.4 03/12/2021 1104   ALKPHOS 85 03/12/2021 1104   AST 18 03/12/2021 1104   ALT 23 03/12/2021 1104   BILITOT 0.6 03/12/2021 1104       RADIOGRAPHIC STUDIES: CT Chest W Contrast  Result Date: 03/13/2021 CLINICAL DATA:   Primary Cancer Type: Lung Imaging Indication: Assess response to therapy Interval therapy since last imaging? Yes Initial Cancer Diagnosis Date: 12/15/2020; Established by: Biopsy-proven Detailed Pathology: Stage IIIB non-small cell lung cancer, adenocarcinoma. Primary Tumor location: Right upper lobe. Surgeries: Cholecystectomy, hysterectomy, appendectomy. Chemotherapy: Yes; Ongoing? Yes; Most recent administration: 02/09/2021 Immunotherapy? No Radiation therapy? Yes; Date Range: 01/08/2021 - 02/19/2021; Target: Right lung EXAM: CT CHEST WITH CONTRAST TECHNIQUE: Multidetector CT imaging of the chest was performed during intravenous contrast administration. RADIATION DOSE REDUCTION: This exam was performed according to the departmental dose-optimization program which includes automated exposure control, adjustment of the mA and/or kV according to patient size and/or use of iterative reconstruction technique. CONTRAST:  38m OMNIPAQUE IOHEXOL 300 MG/ML  SOLN COMPARISON:  Most recent CT chest 02/22/2021.  12/12/2020 PET-CT. FINDINGS: Cardiovascular: There are no significant vascular findings.  The heart size is normal. There is no pericardial effusion. Mediastinum/Nodes: Stable right hilar adenopathy, measuring 14 mm on image 66/2. Previously demonstrated subcarinal adenopathy has resolved. There are no enlarged axillary or left hilar lymph nodes. The thyroid gland, trachea and esophagus demonstrate no significant findings. Lungs/Pleura: No pleural effusion or pneumothorax. Underlying moderate centrilobular and paraseptal emphysema with stable biapical scarring. The spiculated right upper lobe nodule measures 8 x 9 mm on image 59/5 (previously 9 x 10 mm, remeasured). There are few scattered ground-glass densities in both lungs which are likely inflammatory or treatment related, but no suspicious pulmonary nodules. Upper abdomen: The visualized upper abdomen appears stable without suspicious findings. There is a small  left renal cyst. Musculoskeletal/Chest wall: There is no chest wall mass or suspicious osseous finding. IMPRESSION: 1. Further improvement in previously demonstrated nodal metastases to the mediastinum. Right hilar nodal enlargement has not significantly changed. 2. The spiculated right upper lobe primary is stable to minimally smaller. No evidence of progressive metastatic disease. 3. Aortic Atherosclerosis (ICD10-I70.0) and Emphysema (ICD10-J43.9). Electronically Signed   By: Richardean Sale M.D.   On: 03/13/2021 10:43   CT Angio Chest PE W and/or Wo Contrast  Result Date: 02/22/2021 CLINICAL DATA:  Shortness of breath and chest pain. Clinical concern for pulmonary embolus. History of lung cancer with abdominal pain. Elevated bilirubin and diarrhea. EXAM: CT ANGIOGRAPHY CHEST CT ABDOMEN AND PELVIS WITH CONTRAST TECHNIQUE: Multidetector CT imaging of the chest was performed using the standard protocol during bolus administration of intravenous contrast. Multiplanar CT image reconstructions and MIPs were obtained to evaluate the vascular anatomy. Multidetector CT imaging of the abdomen and pelvis was performed using the standard protocol during bolus administration of intravenous contrast. RADIATION DOSE REDUCTION: This exam was performed according to the departmental dose-optimization program which includes automated exposure control, adjustment of the mA and/or kV according to patient size and/or use of iterative reconstruction technique. CONTRAST:  61m OMNIPAQUE IOHEXOL 350 MG/ML SOLN COMPARISON:  PET-CT 12/12/2020.  CTA chest 11/27/2020. FINDINGS: CTA CHEST FINDINGS Cardiovascular: The heart size is normal. No substantial pericardial effusion. No thoracic aortic aneurysm. There is no filling defect within the opacified pulmonary arteries to suggest the presence of an acute pulmonary embolus. Mediastinum/Nodes: Bulky right hilar lymphadenopathy seen on the previous study has decreased with right hilar lymph  node measuring 14 mm short axis today compared to 24 mm previously. Subcarinal lymphadenopathy has also decreased measuring approximate 11 mm today compared to 17 mm previously. No left hilar lymphadenopathy. Possible mild wall thickening in the mid esophagus (image 38/2). There is no axillary lymphadenopathy. Lungs/Pleura: Centrilobular and paraseptal emphysema evident. Right upper lobe pulmonary nodule has decreased in the interval measuring 9 x 7 mm today on 52/7, decreased from 12 x 11 mm (remeasured) previously. No new suspicious nodule or mass. No focal airspace consolidation. No pleural effusion. Musculoskeletal: No worrisome lytic or sclerotic osseous abnormality. Review of the MIP images confirms the above findings. CT ABDOMEN and PELVIS FINDINGS Hepatobiliary: No suspicious focal abnormality within the liver parenchyma. Gallbladder surgically absent. Common bile duct measures 8 mm diameter in the head of the pancreas increased from 5 mm previously. Pancreas: Prominence of the main pancreatic duct in the head of pancreas is similar to prior with main pancreatic duct measuring up to 4 mm diameter. Spleen: No splenomegaly. No focal mass lesion. Adrenals/Urinary Tract: No adrenal nodule or mass. 10 mm subcapsular lesion interpolar right kidney has attenuation too high to be a simple cyst. 13 mm  hypoattenuating lesion upper interpolar left kidney is likely a cyst. No evidence for hydroureter. The urinary bladder appears normal for the degree of distention. Stomach/Bowel: Stomach is unremarkable. No gastric wall thickening. No evidence of outlet obstruction. Duodenum is normally positioned as is the ligament of Treitz. No small bowel wall thickening. No small bowel dilatation. The terminal ileum is normal. The appendix is not well visualized, but there is no edema or inflammation in the region of the cecum. No gross colonic mass. No colonic wall thickening. Diverticular changes are noted in the left colon  without evidence of diverticulitis. Vascular/Lymphatic: There is mild atherosclerotic calcification of the abdominal aorta without aneurysm. There is no gastrohepatic or hepatoduodenal ligament lymphadenopathy. No retroperitoneal or mesenteric lymphadenopathy. No pelvic sidewall lymphadenopathy. Reproductive: Unremarkable. Other: No intraperitoneal free fluid. Musculoskeletal: No worrisome lytic or sclerotic osseous abnormality. Review of the MIP images confirms the above findings. IMPRESSION: 1. No evidence for acute pulmonary embolus. 2. Interval decrease in size of the right upper lobe pulmonary nodule and bulky right hilar and subcarinal lymphadenopathy. 3. No evidence for metastatic disease in the abdomen or pelvis. 4. 10 mm subcapsular lesion interpolar right kidney has attenuation too high to be a simple cyst. While this may represent a cyst complicated by proteinaceous debris or hemorrhage, neoplasm could have a similar appearance. MRI of the abdomen with and without contrast recommended to further evaluate. 5. Prominence of the main pancreatic duct in the head of pancreas is similar to prior but slightly progressive. While likely related to prior surgery, given the reported history of elevated bilirubin, MRCP or ERCP may be warranted to further evaluate. 6. Possible mild wall thickening in the mid esophagus. Esophagitis could have this appearance. 7. Aortic Atherosclerosis (ICD10-I70.0) and Emphysema (ICD10-J43.9). Electronically Signed   By: Misty Stanley M.D.   On: 02/22/2021 12:14   CT ABDOMEN PELVIS W CONTRAST  Result Date: 02/22/2021 CLINICAL DATA:  Shortness of breath and chest pain. Clinical concern for pulmonary embolus. History of lung cancer with abdominal pain. Elevated bilirubin and diarrhea. EXAM: CT ANGIOGRAPHY CHEST CT ABDOMEN AND PELVIS WITH CONTRAST TECHNIQUE: Multidetector CT imaging of the chest was performed using the standard protocol during bolus administration of intravenous  contrast. Multiplanar CT image reconstructions and MIPs were obtained to evaluate the vascular anatomy. Multidetector CT imaging of the abdomen and pelvis was performed using the standard protocol during bolus administration of intravenous contrast. RADIATION DOSE REDUCTION: This exam was performed according to the departmental dose-optimization program which includes automated exposure control, adjustment of the mA and/or kV according to patient size and/or use of iterative reconstruction technique. CONTRAST:  54m OMNIPAQUE IOHEXOL 350 MG/ML SOLN COMPARISON:  PET-CT 12/12/2020.  CTA chest 11/27/2020. FINDINGS: CTA CHEST FINDINGS Cardiovascular: The heart size is normal. No substantial pericardial effusion. No thoracic aortic aneurysm. There is no filling defect within the opacified pulmonary arteries to suggest the presence of an acute pulmonary embolus. Mediastinum/Nodes: Bulky right hilar lymphadenopathy seen on the previous study has decreased with right hilar lymph node measuring 14 mm short axis today compared to 24 mm previously. Subcarinal lymphadenopathy has also decreased measuring approximate 11 mm today compared to 17 mm previously. No left hilar lymphadenopathy. Possible mild wall thickening in the mid esophagus (image 38/2). There is no axillary lymphadenopathy. Lungs/Pleura: Centrilobular and paraseptal emphysema evident. Right upper lobe pulmonary nodule has decreased in the interval measuring 9 x 7 mm today on 52/7, decreased from 12 x 11 mm (remeasured) previously. No new suspicious nodule or  mass. No focal airspace consolidation. No pleural effusion. Musculoskeletal: No worrisome lytic or sclerotic osseous abnormality. Review of the MIP images confirms the above findings. CT ABDOMEN and PELVIS FINDINGS Hepatobiliary: No suspicious focal abnormality within the liver parenchyma. Gallbladder surgically absent. Common bile duct measures 8 mm diameter in the head of the pancreas increased from 5 mm  previously. Pancreas: Prominence of the main pancreatic duct in the head of pancreas is similar to prior with main pancreatic duct measuring up to 4 mm diameter. Spleen: No splenomegaly. No focal mass lesion. Adrenals/Urinary Tract: No adrenal nodule or mass. 10 mm subcapsular lesion interpolar right kidney has attenuation too high to be a simple cyst. 13 mm hypoattenuating lesion upper interpolar left kidney is likely a cyst. No evidence for hydroureter. The urinary bladder appears normal for the degree of distention. Stomach/Bowel: Stomach is unremarkable. No gastric wall thickening. No evidence of outlet obstruction. Duodenum is normally positioned as is the ligament of Treitz. No small bowel wall thickening. No small bowel dilatation. The terminal ileum is normal. The appendix is not well visualized, but there is no edema or inflammation in the region of the cecum. No gross colonic mass. No colonic wall thickening. Diverticular changes are noted in the left colon without evidence of diverticulitis. Vascular/Lymphatic: There is mild atherosclerotic calcification of the abdominal aorta without aneurysm. There is no gastrohepatic or hepatoduodenal ligament lymphadenopathy. No retroperitoneal or mesenteric lymphadenopathy. No pelvic sidewall lymphadenopathy. Reproductive: Unremarkable. Other: No intraperitoneal free fluid. Musculoskeletal: No worrisome lytic or sclerotic osseous abnormality. Review of the MIP images confirms the above findings. IMPRESSION: 1. No evidence for acute pulmonary embolus. 2. Interval decrease in size of the right upper lobe pulmonary nodule and bulky right hilar and subcarinal lymphadenopathy. 3. No evidence for metastatic disease in the abdomen or pelvis. 4. 10 mm subcapsular lesion interpolar right kidney has attenuation too high to be a simple cyst. While this may represent a cyst complicated by proteinaceous debris or hemorrhage, neoplasm could have a similar appearance. MRI of the  abdomen with and without contrast recommended to further evaluate. 5. Prominence of the main pancreatic duct in the head of pancreas is similar to prior but slightly progressive. While likely related to prior surgery, given the reported history of elevated bilirubin, MRCP or ERCP may be warranted to further evaluate. 6. Possible mild wall thickening in the mid esophagus. Esophagitis could have this appearance. 7. Aortic Atherosclerosis (ICD10-I70.0) and Emphysema (ICD10-J43.9). Electronically Signed   By: Misty Stanley M.D.   On: 02/22/2021 12:14   DG Chest Portable 1 View  Result Date: 02/22/2021 CLINICAL DATA:  Shortness of breath EXAM: PORTABLE CHEST 1 VIEW COMPARISON:  Chest x-ray 12/15/2020 FINDINGS: Heart size and mediastinal contours are within normal limits. No suspicious pulmonary opacities identified. No pleural effusion or pneumothorax visualized. No acute osseous abnormality appreciated. IMPRESSION: No acute intrathoracic process identified. Electronically Signed   By: Ofilia Neas M.D.   On: 02/22/2021 10:32    ASSESSMENT AND PLAN: This is a very pleasant 56 years old white female with stage IIIB (T1b, N3, M0) non-small cell lung cancer, adenocarcinoma diagnosed in November 2022 with no actionable mutation and negative PD-L1 expression. The patient completed a course of concurrent chemoradiation with weekly carboplatin for AUC of 2 and paclitaxel 45 Mg/M2 status post 7 cycles.  She has been tolerating her treatment well except for the mild odynophagia and skin burns. She was also recently admitted to the hospital complaining of dysphagia and odynophagia secondary to  radiation induced esophagitis.  She is feeling much better but continues to have residual dysphagia.  She is followed by gastroenterology and was seen by Dr. Ardis Hughs during her hospitalization. The patient had repeat CT scan of the chest performed recently.  I personally and independently reviewed the scan images and discussed  the results with the patient and her family today. Her scan showed improvement of her disease. I recommended for the patient treatment with consolidation immunotherapy with Imfinzi 1500 Mg IV every 4 weeks.  I discussed with the patient the benefit of the treatment with immunotherapy in comparison to observation and placebo. I also discussed with the patient the adverse effect of this treatment including but not limited to immunotherapy mediated skin rash, diarrhea, inflammation of the lung, kidney, liver, thyroid or other endocrine dysfunction including type 1 diabetes mellitus. The patient is interested in the treatment and she is expected to start the first dose of this treatment next week. She will come back for follow-up visit in 5 weeks for evaluation with the start of cycle #2. For the dysphagia, the patient will continue with her current treatment as prescribed by Dr. Ardis Hughs and she will follow-up with him if needed for esophageal dilatation in the future. The patient was advised to call immediately if she has any other concerning symptoms in the interval. The patient voices understanding of current disease status and treatment options and is in agreement with the current care plan.  All questions were answered. The patient knows to call the clinic with any problems, questions or concerns. We can certainly see the patient much sooner if necessary.  The total time spent in the appointment was 35 minutes.  Disclaimer: This note was dictated with voice recognition software. Similar sounding words can inadvertently be transcribed and may not be corrected upon review.

## 2021-03-18 NOTE — Progress Notes (Signed)
DISCONTINUE ON PATHWAY REGIMEN - Non-Small Cell Lung     Administer weekly:     Paclitaxel      Carboplatin   **Always confirm dose/schedule in your pharmacy ordering system**  REASON: Continuation Of Treatment PRIOR TREATMENT: [id number]: Carboplatin AUC=2 + Paclitaxel 45 mg/m2 Weekly During Radiation TREATMENT RESPONSE: Partial Response (PR)  START ON PATHWAY REGIMEN - Non-Small Cell Lung     A cycle is every 28 days:     Durvalumab   **Always confirm dose/schedule in your pharmacy ordering system**  Patient Characteristics: Preoperative or Nonsurgical Candidate (Clinical Staging), Stage III - Nonsurgical Candidate (Nonsquamous and Squamous), PS = 0, 1 Therapeutic Status: Preoperative or Nonsurgical Candidate (Clinical Staging) AJCC T Category: cT1b AJCC N Category: cN3 AJCC M Category: cM0 AJCC 8 Stage Grouping: IIIB ECOG Performance Status: 1 Intent of Therapy: Curative Intent, Discussed with Patient

## 2021-03-19 ENCOUNTER — Telehealth: Payer: Self-pay

## 2021-03-19 NOTE — Telephone Encounter (Signed)
Patient called regarding appointments that needed to be scheduled. These appointments were already made, and no one contacted the patient. I let her know of the appointment dates and told her she can look at them on her mychart. Patient verbalized understanding and had no further questions and concerns.

## 2021-03-20 ENCOUNTER — Encounter: Payer: Self-pay | Admitting: *Deleted

## 2021-03-20 NOTE — Progress Notes (Signed)
Oncology Nurse Navigator Documentation  Oncology Nurse Navigator Flowsheets 03/20/2021 01/06/2021 01/02/2021 12/23/2020 12/22/2020 12/22/2020 12/22/2020  Abnormal Finding Date - - - - - - -  Confirmed Diagnosis Date - - - - - - -  Diagnosis Status Confirmed Diagnosis Complete - - - Pending Molecular Studies Confirmed Diagnosis Complete -  Planned Course of Treatment Chemotherapy;Radiation - Chemo/Radiation Concurrent Chemo/Radiation Concurrent - - -  Phase of Treatment Targeted Therapy Radiation Radiation Radiation - - Radiation  Chemotherapy Pending- Reason: - - - - - - (No Data)  Chemotherapy Actual Start Date: 01/05/2021 - 01/05/2021 - - - -  Chemotherapy Actual End Date: 02/24/2021 - - - - - -  Radiation Actual Start Date: 01/01/2021 01/01/2021 12/29/2020 - - - -  Radiation Actual End Date: 02/19/2021 - - - - - -  Targeted Therapy Actual Start Date: 03/25/2021 - - - - - -  Navigator Follow Up Date: 04/02/2021 01/19/2021 01/05/2021 12/29/2020 - - 12/23/2020  Navigator Follow Up Reason: Follow-up Appointment Follow-up Appointment Chemotherapy Other: - - Appointment Review  Navigator Location [facility]  Navigator Encounter Type Other: Clinic/MDC;Treatment Other: Other: Other: Diagnostic Results Follow-up Appt  Treatment Initiated Date - 01/01/2021 - 12/29/2020 - - -  Patient Visit Type Other MedOnc;Other Other Other - - -  Treatment Phase Treatment Treatment Other Other - - -  Barriers/Navigation Needs Coordination of Care Education Coordination of Care Coordination of Care - Coordination of Care Coordination of Care  Education - Other - - - - -  Interventions Coordination of Care Education;Psycho-Social Support Coordination of Care Coordination of Care Other Coordination of Care Coordination of Care  Acuity Level 2-Minimal Needs (1-2 Barriers Identified) Level 4-High Needs (Greater Than 4 Barriers  Identified) Level 2-Minimal Needs (1-2 Barriers Identified) Level 2-Minimal Needs (1-2 Barriers Identified) Level 2-Minimal Needs (1-2 Barriers Identified) Level 2-Minimal Needs (1-2 Barriers Identified) Level 2-Minimal Needs (1-2 Barriers Identified)  Coordination of Care Other - - Other Pathology Pathology Appts  Education Method - Verbal - - - - -  Time Spent with Patient 30 45 30 30 15 15  30

## 2021-03-20 NOTE — Progress Notes (Signed)
Julie Nicholson is here today for follow up post radiation to the lung.  Lung Side: right  Completed radiation treatment on: 02/19/2021  Does the patient complain of any of the following: Pain:No Shortness of breath w/wo exertion: Patient reports having more shortness of breath on exertion. Reports losing voice at times. Encouraged patient to use Albuterol inhaler has directed. Patient voiced understanding.  Cough: Yes Hemoptysis: no Pain with swallowing: continues to have soreness with swallowing.  Swallowing/choking concerns: Patient continues to take small bites to avoid choking.  Appetite: Good  Weight- Wt Readings from Last 3 Encounters:  03/23/21 101 lb 6 oz (46 kg)  03/18/21 101 lb 1.6 oz (45.9 kg)  02/26/21 105 lb 13.1 oz (48 kg)    Energy Level: Continues to have a low energy level.  Post radiation skin Changes: no, patient reports area has healed.    Additional comments if applicable:    Vitals:   03/23/21 1538  BP: 116/66  Pulse: (!) 114  Resp: 18  Temp: (!) 97 F (36.1 C)  TempSrc: Temporal  SpO2: 100%  Weight: 101 lb 6 oz (46 kg)  Height: 5\' 1"  (1.549 m)

## 2021-03-22 NOTE — Progress Notes (Incomplete)
°  Radiation Oncology         [phone] ________________________________  Patient Name: Julie Nicholson MRN: [id number] DOB: 05/25/1965 Referring Physician: Curt Bears (Profile Not Attached) Date of Service: 02/19/2021 [facility] [place]                                                        End Of Treatment Note  Diagnoses: C34.11-Malignant neoplasm of upper lobe, right bronchus or lung  Cancer Staging: Non-small cell carcinoma of the RUL, adenocarcinoma stage III-B (T1b, N3, M0)  Intent: Curative  Radiation Treatment Dates: 01/07/2021 through 02/19/2021 Site Technique Total Dose (Gy) Dose per Fx (Gy) Completed Fx Beam Energies  Lung, Right: Lung_Rt 3D 60/60 2 30/30 6X   Narrative: The patient tolerated radiation therapy relatively well. She reported pain, increased cough, and food getting stuck in her throat causing chest discomfort. She has been intolerant of Carafate. Viscous Xylocaine seems to be the most helpful for her.  She is forcing fluids as much as possible.  She completed her last cycle of chemotherapy on 02/17/21.   Plan: The patient will follow-up with radiation oncology in one month .  ________________________________________________ -----------------------------------  Blair Promise, PhD, MD  This document serves as a record of services personally performed by Gery Pray, MD. It was created on his behalf by Roney Mans, a trained medical scribe. The creation of this record is based on the scribe's personal observations and the provider's statements to them. This document has been checked and approved by the attending provider.

## 2021-03-22 NOTE — Progress Notes (Signed)
Radiation Oncology         [phone]) [phone] ________________________________  Name: Julie Nicholson MRN: [id number]  Date: 03/23/2021  DOB: 09-20-1965  Follow-Up Visit Note  CC: [name], NP  Curt Bears, MD    ICD-10-CM   1. Primary adenocarcinoma of upper lobe of right lung (HCC)  C34.11       Diagnosis:  The encounter diagnosis was Primary adenocarcinoma of upper lobe of right lung ([place]).   Non-small cell carcinoma of the RUL, adenocarcinoma stage III-B (T1b, N3, M0)  Interval Since Last Radiation: 1 month and 1 day   Intent: Curative  Radiation Treatment Dates: 01/07/2021 through 02/19/2021 Site Technique Total Dose (Gy) Dose per Fx (Gy) Completed Fx Beam Energies  Lung, Right: Lung_Rt 3D 60/60 2 30/30 6X    Narrative:  The patient returns today for routine follow-up. The patient tolerated radiation therapy relatively well other than reports of pain, increased cough, and food getting stuck in her throat causing chest discomfort. She was intolerant of Carafate. Viscous Xylocaine seems to be the most helpful for her.    Since her initial consultation date of 12/31/20, the patient has received chemotherapy consisting of carboplatin and paclitaxel on 01/05/21 through 02/17/21 under the care of Dr. Julien Nordmann. The patient tolerated systemic treatment well other than loss of appetite. During a follow-up with Dr. Julien Nordmann on 02/09/21, the patient endorsed mild odynophagia and skin burns as above from RT.  On 02/22/21, the patient presented to the ED with complaints of odynophagia secondary to RT. GI was consulted and the patient was admitted and underwent EGD on 02/26/21 which revealed a single 1cm oval shape shallow ulceration in the proximal esophagus. Viral culture biopsies were also obtained from the lesion which were negative.  Based on findings, GI reccommended for BID valcyclovir x 2 weeks. The patient was also found to be COVID positive at admission and treated with  paxlovid. CT of the chest abdomen and pelvis performed for evaluation of SOB and chest pain during this admission revealed an interval decrease in size of the right upper lobe pulmonary nodule and bulky right hilar and subcarinal lymphadenopathy. No evidence was otherwise seen suggestive of metastatic disease in the abdomen or pelvis. (CT also noted a 10 mm subscapular lesion in the interpolar right kidney, noted to either represent a simple cyst complicated by hemorrhage, vs the possibility of neoplasm).   Her most recent chest CT on 03/12/21 demonstrated further improvement of the previously demonstrated nodal metastases to the mediastinum. Right hilar nodal enlargement was also seen and showed no significant change in the interval. The spiculated right upper lobe primary also appeared stable to minimally smaller. No evidence of progressive metastatic disease was otherwise appreciated.   During her most recent follow up visit with Dr. Julien Nordmann on 03/18/21, the patient reported feeling better since her recent admission other than continued residual dysphagia. Further treatment options were discussed with the patient, and she agreed to proceed with  consolidation immunotherapy with Imfinzi 1500 Mg IV every 4 weeks.   Of note: the patient quit smoking in November with help from wellbutrin.      Her swallowing is almost back to normal at this time.  She still has to be a little careful with eating solid foods.  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  She will talk with Dr. Julien Nordmann about this issue in the  near future.                Allergies:  is allergic to benadryl [diphenhydramine].  Meds: Current Outpatient Medications  Medication Sig Dispense Refill   acetaminophen (TYLENOL) 500 MG tablet Take 1,000 mg by mouth every 6 (six) hours as needed for moderate pain.     albuterol (VENTOLIN HFA)  108 (90 Base) MCG/ACT inhaler Inhale 1-2 puffs into the lungs every 6 (six) hours as needed for wheezing or shortness of breath. 6.7 g 0   aspirin EC 81 MG tablet Take 81 mg by mouth at bedtime.     atorvastatin (LIPITOR) 40 MG tablet Take 40 mg by mouth at bedtime.     benzonatate (TESSALON) 100 MG capsule Take 1-2 capsules (100-200 mg total) by mouth 3 (three) times daily as needed for cough. 20 capsule 0   Biotin 5000 MCG TABS Take 5,000 mcg by mouth at bedtime.     buPROPion (WELLBUTRIN SR) 150 MG 12 hr tablet Take 1 tablet (150 mg total) by mouth 2 (two) times daily. 60 tablet 5   Ca Carbonate-Mag Hydroxide (ROLAIDS PO) Take 1 tablet by mouth daily as needed (heartburn).     fluticasone (FLONASE) 50 MCG/ACT nasal spray Place 2 sprays into both nostrils daily as needed for allergies.     Oxycodone HCl 10 MG TABS Take 1 tablet by mouth 3 (three) times daily as needed (pain).     pantoprazole (PROTONIX) 40 MG tablet Take 1 tablet (40 mg total) by mouth daily. 30 tablet 0   prochlorperazine (COMPAZINE) 10 MG tablet Take 1 tablet (10 mg total) by mouth every 6 (six) hours as needed. 30 tablet 2   UBRELVY 100 MG TABS Take 100 mg by mouth daily as needed (migraine).     lidocaine (XYLOCAINE) 2 % solution Swish/gargle as directed 15 mLs in the mouth or throat every 4 (four) hours as needed for mouth pain (throat pain). (Patient not taking: Reported on 03/23/2021) 100 mL 0   Multiple Vitamins-Minerals (HAIR SKIN AND NAILS FORMULA PO) Take 1 tablet by mouth daily.     sucralfate (CARAFATE) 1 g tablet Dissolve 1 tablet in 10 mL H20 and swallow 30 min prior to meals and bedtime. (Patient not taking: Reported on 03/23/2021) 90 tablet 0   No current facility-administered medications for this encounter.    Physical Findings: The patient is in no acute distress. Patient is alert and oriented.  height is 5\' 1"  (1.549 m) and weight is 101 lb 6 oz (46 kg). Her temporal temperature is 97 F (36.1 C) (abnormal).  Her blood pressure is 116/66 and her pulse is 114 (abnormal). Her respiration is 18 and oxygen saturation is 100%. .  Lungs are clear to auscultation bilaterally. Heart has regular rate and rhythm. No palpable cervical, supraclavicular, or axillary adenopathy. Abdomen soft, non-tender, normal bowel sounds.    Lab Findings: Lab Results  Component Value Date   WBC 2.8 (L) 03/12/2021   HGB 10.5 (L) 03/12/2021   HCT 29.7 (L) 03/12/2021   MCV 102.1 (H) 03/12/2021   PLT 138 (L) 03/12/2021    Radiographic Findings: CT Chest W Contrast  Result Date: 03/13/2021 CLINICAL DATA:  Primary Cancer Type: Lung Imaging Indication: Assess response to therapy Interval therapy since last imaging? Yes Initial Cancer Diagnosis Date: 12/15/2020; Established by: Biopsy-proven Detailed Pathology: Stage IIIB non-small cell lung cancer, adenocarcinoma. Primary Tumor location: Right upper lobe. Surgeries: Cholecystectomy, hysterectomy, appendectomy. Chemotherapy: Yes; Ongoing? Yes; Most recent administration:  02/09/2021 Immunotherapy? No Radiation therapy? Yes; Date Range: 01/08/2021 - 02/19/2021; Target: Right lung EXAM: CT CHEST WITH CONTRAST TECHNIQUE: Multidetector CT imaging of the chest was performed during intravenous contrast administration. RADIATION DOSE REDUCTION: This exam was performed according to the departmental dose-optimization program which includes automated exposure control, adjustment of the mA and/or kV according to patient size and/or use of iterative reconstruction technique. CONTRAST:  2mL OMNIPAQUE IOHEXOL 300 MG/ML  SOLN COMPARISON:  Most recent CT chest 02/22/2021.  12/12/2020 PET-CT. FINDINGS: Cardiovascular: There are no significant vascular findings. The heart size is normal. There is no pericardial effusion. Mediastinum/Nodes: Stable right hilar adenopathy, measuring 14 mm on image 66/2. Previously demonstrated subcarinal adenopathy has resolved. There are no enlarged axillary or left hilar  lymph nodes. The thyroid gland, trachea and esophagus demonstrate no significant findings. Lungs/Pleura: No pleural effusion or pneumothorax. Underlying moderate centrilobular and paraseptal emphysema with stable biapical scarring. The spiculated right upper lobe nodule measures 8 x 9 mm on image 59/5 (previously 9 x 10 mm, remeasured). There are few scattered ground-glass densities in both lungs which are likely inflammatory or treatment related, but no suspicious pulmonary nodules. Upper abdomen: The visualized upper abdomen appears stable without suspicious findings. There is a small left renal cyst. Musculoskeletal/Chest wall: There is no chest wall mass or suspicious osseous finding. IMPRESSION: 1. Further improvement in previously demonstrated nodal metastases to the mediastinum. Right hilar nodal enlargement has not significantly changed. 2. The spiculated right upper lobe primary is stable to minimally smaller. No evidence of progressive metastatic disease. 3. Aortic Atherosclerosis (ICD10-I70.0) and Emphysema (ICD10-J43.9). Electronically Signed   By: Richardean Sale M.D.   On: 03/13/2021 10:43   CT Angio Chest PE W and/or Wo Contrast  Result Date: 02/22/2021 CLINICAL DATA:  Shortness of breath and chest pain. Clinical concern for pulmonary embolus. History of lung cancer with abdominal pain. Elevated bilirubin and diarrhea. EXAM: CT ANGIOGRAPHY CHEST CT ABDOMEN AND PELVIS WITH CONTRAST TECHNIQUE: Multidetector CT imaging of the chest was performed using the standard protocol during bolus administration of intravenous contrast. Multiplanar CT image reconstructions and MIPs were obtained to evaluate the vascular anatomy. Multidetector CT imaging of the abdomen and pelvis was performed using the standard protocol during bolus administration of intravenous contrast. RADIATION DOSE REDUCTION: This exam was performed according to the departmental dose-optimization program which includes automated exposure  control, adjustment of the mA and/or kV according to patient size and/or use of iterative reconstruction technique. CONTRAST:  18mL OMNIPAQUE IOHEXOL 350 MG/ML SOLN COMPARISON:  PET-CT 12/12/2020.  CTA chest 11/27/2020. FINDINGS: CTA CHEST FINDINGS Cardiovascular: The heart size is normal. No substantial pericardial effusion. No thoracic aortic aneurysm. There is no filling defect within the opacified pulmonary arteries to suggest the presence of an acute pulmonary embolus. Mediastinum/Nodes: Bulky right hilar lymphadenopathy seen on the previous study has decreased with right hilar lymph node measuring 14 mm short axis today compared to 24 mm previously. Subcarinal lymphadenopathy has also decreased measuring approximate 11 mm today compared to 17 mm previously. No left hilar lymphadenopathy. Possible mild wall thickening in the mid esophagus (image 38/2). There is no axillary lymphadenopathy. Lungs/Pleura: Centrilobular and paraseptal emphysema evident. Right upper lobe pulmonary nodule has decreased in the interval measuring 9 x 7 mm today on 52/7, decreased from 12 x 11 mm (remeasured) previously. No new suspicious nodule or mass. No focal airspace consolidation. No pleural effusion. Musculoskeletal: No worrisome lytic or sclerotic osseous abnormality. Review of the MIP images confirms the above findings.  CT ABDOMEN and PELVIS FINDINGS Hepatobiliary: No suspicious focal abnormality within the liver parenchyma. Gallbladder surgically absent. Common bile duct measures 8 mm diameter in the head of the pancreas increased from 5 mm previously. Pancreas: Prominence of the main pancreatic duct in the head of pancreas is similar to prior with main pancreatic duct measuring up to 4 mm diameter. Spleen: No splenomegaly. No focal mass lesion. Adrenals/Urinary Tract: No adrenal nodule or mass. 10 mm subcapsular lesion interpolar right kidney has attenuation too high to be a simple cyst. 13 mm hypoattenuating lesion upper  interpolar left kidney is likely a cyst. No evidence for hydroureter. The urinary bladder appears normal for the degree of distention. Stomach/Bowel: Stomach is unremarkable. No gastric wall thickening. No evidence of outlet obstruction. Duodenum is normally positioned as is the ligament of Treitz. No small bowel wall thickening. No small bowel dilatation. The terminal ileum is normal. The appendix is not well visualized, but there is no edema or inflammation in the region of the cecum. No gross colonic mass. No colonic wall thickening. Diverticular changes are noted in the left colon without evidence of diverticulitis. Vascular/Lymphatic: There is mild atherosclerotic calcification of the abdominal aorta without aneurysm. There is no gastrohepatic or hepatoduodenal ligament lymphadenopathy. No retroperitoneal or mesenteric lymphadenopathy. No pelvic sidewall lymphadenopathy. Reproductive: Unremarkable. Other: No intraperitoneal free fluid. Musculoskeletal: No worrisome lytic or sclerotic osseous abnormality. Review of the MIP images confirms the above findings. IMPRESSION: 1. No evidence for acute pulmonary embolus. 2. Interval decrease in size of the right upper lobe pulmonary nodule and bulky right hilar and subcarinal lymphadenopathy. 3. No evidence for metastatic disease in the abdomen or pelvis. 4. 10 mm subcapsular lesion interpolar right kidney has attenuation too high to be a simple cyst. While this may represent a cyst complicated by proteinaceous debris or hemorrhage, neoplasm could have a similar appearance. MRI of the abdomen with and without contrast recommended to further evaluate. 5. Prominence of the main pancreatic duct in the head of pancreas is similar to prior but slightly progressive. While likely related to prior surgery, given the reported history of elevated bilirubin, MRCP or ERCP may be warranted to further evaluate. 6. Possible mild wall thickening in the mid esophagus. Esophagitis could  have this appearance. 7. Aortic Atherosclerosis (ICD10-I70.0) and Emphysema (ICD10-J43.9). Electronically Signed   By: Misty Stanley M.D.   On: 02/22/2021 12:14   CT ABDOMEN PELVIS W CONTRAST  Result Date: 02/22/2021 CLINICAL DATA:  Shortness of breath and chest pain. Clinical concern for pulmonary embolus. History of lung cancer with abdominal pain. Elevated bilirubin and diarrhea. EXAM: CT ANGIOGRAPHY CHEST CT ABDOMEN AND PELVIS WITH CONTRAST TECHNIQUE: Multidetector CT imaging of the chest was performed using the standard protocol during bolus administration of intravenous contrast. Multiplanar CT image reconstructions and MIPs were obtained to evaluate the vascular anatomy. Multidetector CT imaging of the abdomen and pelvis was performed using the standard protocol during bolus administration of intravenous contrast. RADIATION DOSE REDUCTION: This exam was performed according to the departmental dose-optimization program which includes automated exposure control, adjustment of the mA and/or kV according to patient size and/or use of iterative reconstruction technique. CONTRAST:  66mL OMNIPAQUE IOHEXOL 350 MG/ML SOLN COMPARISON:  PET-CT 12/12/2020.  CTA chest 11/27/2020. FINDINGS: CTA CHEST FINDINGS Cardiovascular: The heart size is normal. No substantial pericardial effusion. No thoracic aortic aneurysm. There is no filling defect within the opacified pulmonary arteries to suggest the presence of an acute pulmonary embolus. Mediastinum/Nodes: Bulky right hilar lymphadenopathy seen  on the previous study has decreased with right hilar lymph node measuring 14 mm short axis today compared to 24 mm previously. Subcarinal lymphadenopathy has also decreased measuring approximate 11 mm today compared to 17 mm previously. No left hilar lymphadenopathy. Possible mild wall thickening in the mid esophagus (image 38/2). There is no axillary lymphadenopathy. Lungs/Pleura: Centrilobular and paraseptal emphysema evident.  Right upper lobe pulmonary nodule has decreased in the interval measuring 9 x 7 mm today on 52/7, decreased from 12 x 11 mm (remeasured) previously. No new suspicious nodule or mass. No focal airspace consolidation. No pleural effusion. Musculoskeletal: No worrisome lytic or sclerotic osseous abnormality. Review of the MIP images confirms the above findings. CT ABDOMEN and PELVIS FINDINGS Hepatobiliary: No suspicious focal abnormality within the liver parenchyma. Gallbladder surgically absent. Common bile duct measures 8 mm diameter in the head of the pancreas increased from 5 mm previously. Pancreas: Prominence of the main pancreatic duct in the head of pancreas is similar to prior with main pancreatic duct measuring up to 4 mm diameter. Spleen: No splenomegaly. No focal mass lesion. Adrenals/Urinary Tract: No adrenal nodule or mass. 10 mm subcapsular lesion interpolar right kidney has attenuation too high to be a simple cyst. 13 mm hypoattenuating lesion upper interpolar left kidney is likely a cyst. No evidence for hydroureter. The urinary bladder appears normal for the degree of distention. Stomach/Bowel: Stomach is unremarkable. No gastric wall thickening. No evidence of outlet obstruction. Duodenum is normally positioned as is the ligament of Treitz. No small bowel wall thickening. No small bowel dilatation. The terminal ileum is normal. The appendix is not well visualized, but there is no edema or inflammation in the region of the cecum. No gross colonic mass. No colonic wall thickening. Diverticular changes are noted in the left colon without evidence of diverticulitis. Vascular/Lymphatic: There is mild atherosclerotic calcification of the abdominal aorta without aneurysm. There is no gastrohepatic or hepatoduodenal ligament lymphadenopathy. No retroperitoneal or mesenteric lymphadenopathy. No pelvic sidewall lymphadenopathy. Reproductive: Unremarkable. Other: No intraperitoneal free fluid. Musculoskeletal:  No worrisome lytic or sclerotic osseous abnormality. Review of the MIP images confirms the above findings. IMPRESSION: 1. No evidence for acute pulmonary embolus. 2. Interval decrease in size of the right upper lobe pulmonary nodule and bulky right hilar and subcarinal lymphadenopathy. 3. No evidence for metastatic disease in the abdomen or pelvis. 4. 10 mm subcapsular lesion interpolar right kidney has attenuation too high to be a simple cyst. While this may represent a cyst complicated by proteinaceous debris or hemorrhage, neoplasm could have a similar appearance. MRI of the abdomen with and without contrast recommended to further evaluate. 5. Prominence of the main pancreatic duct in the head of pancreas is similar to prior but slightly progressive. While likely related to prior surgery, given the reported history of elevated bilirubin, MRCP or ERCP may be warranted to further evaluate. 6. Possible mild wall thickening in the mid esophagus. Esophagitis could have this appearance. 7. Aortic Atherosclerosis (ICD10-I70.0) and Emphysema (ICD10-J43.9). Electronically Signed   By: Misty Stanley M.D.   On: 02/22/2021 12:14   DG Chest Portable 1 View  Result Date: 02/22/2021 CLINICAL DATA:  Shortness of breath EXAM: PORTABLE CHEST 1 VIEW COMPARISON:  Chest x-ray 12/15/2020 FINDINGS: Heart size and mediastinal contours are within normal limits. No suspicious pulmonary opacities identified. No pleural effusion or pneumothorax visualized. No acute osseous abnormality appreciated. IMPRESSION: No acute intrathoracic process identified. Electronically Signed   By: Ofilia Neas M.D.   On: 02/22/2021 10:32  Impression: The encounter diagnosis was Primary adenocarcinoma of upper lobe of right lung ([place]).   Non-small cell carcinoma of the RUL, adenocarcinoma stage III-B (T1b, N3, M0)   The patient is recovering from the effects of radiation.  Recent imaging shows good response to her radiation and  radiosensitizing chemotherapy.  Plan: As needed follow-up in radiation oncology.  She will start immunotherapy later this week.    ____________________________________  Blair Promise, PhD, MD  This document serves as a record of services personally performed by Gery Pray, MD. It was created on his behalf by Roney Mans, a trained medical scribe. The creation of this record is based on the scribe's personal observations and the provider's statements to them. This document has been checked and approved by the attending provider.

## 2021-03-23 ENCOUNTER — Ambulatory Visit
Admission: RE | Admit: 2021-03-23 | Discharge: 2021-03-23 | Disposition: A | Discharge status: Home / Self Care | Payer: Commercial Managed Care - PPO | Source: Ambulatory Visit | Attending: Radiation Oncology | Admitting: Radiation Oncology

## 2021-03-23 ENCOUNTER — Other Ambulatory Visit: Payer: Self-pay

## 2021-03-23 ENCOUNTER — Encounter: Payer: Self-pay | Admitting: Radiation Oncology

## 2021-03-23 VITALS — BP 116/66 | HR 114 | Temp 97.0°F | Resp 18 | Ht 61.0 in | Wt 101.4 lb

## 2021-03-23 DIAGNOSIS — I700 Atherosclerosis of aorta: Secondary | ICD-10-CM | POA: Diagnosis not present

## 2021-03-23 DIAGNOSIS — Z923 Personal history of irradiation: Secondary | ICD-10-CM | POA: Insufficient documentation

## 2021-03-23 DIAGNOSIS — Z9221 Personal history of antineoplastic chemotherapy: Secondary | ICD-10-CM | POA: Diagnosis not present

## 2021-03-23 DIAGNOSIS — J432 Centrilobular emphysema: Secondary | ICD-10-CM | POA: Insufficient documentation

## 2021-03-23 DIAGNOSIS — J439 Emphysema, unspecified: Secondary | ICD-10-CM | POA: Diagnosis not present

## 2021-03-23 DIAGNOSIS — Z79899 Other long term (current) drug therapy: Secondary | ICD-10-CM | POA: Diagnosis not present

## 2021-03-23 DIAGNOSIS — C3411 Malignant neoplasm of upper lobe, right bronchus or lung: Secondary | ICD-10-CM | POA: Insufficient documentation

## 2021-03-23 DIAGNOSIS — Z7982 Long term (current) use of aspirin: Secondary | ICD-10-CM | POA: Insufficient documentation

## 2021-03-23 DIAGNOSIS — N281 Cyst of kidney, acquired: Secondary | ICD-10-CM | POA: Diagnosis not present

## 2021-03-25 ENCOUNTER — Other Ambulatory Visit: Payer: Self-pay

## 2021-03-25 ENCOUNTER — Inpatient Hospital Stay: Payer: Commercial Managed Care - PPO

## 2021-03-25 ENCOUNTER — Telehealth: Payer: Self-pay | Admitting: Genetic Counselor

## 2021-03-25 VITALS — BP 103/68 | HR 99 | Temp 97.9°F | Resp 18 | Ht 61.0 in | Wt 100.5 lb

## 2021-03-25 DIAGNOSIS — C3411 Malignant neoplasm of upper lobe, right bronchus or lung: Secondary | ICD-10-CM | POA: Diagnosis present

## 2021-03-25 DIAGNOSIS — Z5112 Encounter for antineoplastic immunotherapy: Secondary | ICD-10-CM | POA: Diagnosis present

## 2021-03-25 LAB — CMP (CANCER CENTER ONLY)
ALT: 12 U/L (ref 0–44)
AST: 15 U/L (ref 15–41)
Albumin: 3.9 g/dL (ref 3.5–5.0)
Alkaline Phosphatase: 107 U/L (ref 38–126)
Anion gap: 9 (ref 5–15)
BUN: 8 mg/dL (ref 6–20)
CO2: 26 mmol/L (ref 22–32)
Calcium: 9.5 mg/dL (ref 8.9–10.3)
Chloride: 105 mmol/L (ref 98–111)
Creatinine: 0.7 mg/dL (ref 0.44–1.00)
GFR, Estimated: >60 mL/min (ref >60)
Glucose, Bld: 87 mg/dL (ref 70–99)
Potassium: 3.4 mmol/L — ABNORMAL LOW (ref 3.5–5.1)
Sodium: 140 mmol/L (ref 135–145)
Total Bilirubin: 0.8 mg/dL (ref 0.3–1.2)
Total Protein: 7.2 g/dL (ref 6.5–8.1)

## 2021-03-25 LAB — CBC WITH DIFFERENTIAL (CANCER CENTER ONLY)
Abs Immature Granulocytes: 0.01 10*3/uL (ref 0.00–0.07)
Basophils Absolute: 0 10*3/uL (ref 0.0–0.1)
Basophils Relative: 0 %
Eosinophils Absolute: 0.2 10*3/uL (ref 0.0–0.5)
Eosinophils Relative: 5 %
HCT: 28 % — ABNORMAL LOW (ref 36.0–46.0)
Hemoglobin: 10 g/dL — ABNORMAL LOW (ref 12.0–15.0)
Immature Granulocytes: 0 %
Lymphocytes Relative: 15 %
Lymphs Abs: 0.5 10*3/uL — ABNORMAL LOW (ref 0.7–4.0)
MCH: 36.5 pg — ABNORMAL HIGH (ref 26.0–34.0)
MCHC: 35.7 g/dL (ref 30.0–36.0)
MCV: 102.2 fL — ABNORMAL HIGH (ref 80.0–100.0)
Monocytes Absolute: 0.4 10*3/uL (ref 0.1–1.0)
Monocytes Relative: 11 %
Neutro Abs: 2.3 10*3/uL (ref 1.7–7.7)
Neutrophils Relative %: 69 %
Platelet Count: 165 10*3/uL (ref 150–400)
RBC: 2.74 MIL/uL — ABNORMAL LOW (ref 3.87–5.11)
RDW: 15.2 % (ref 11.5–15.5)
WBC Count: 3.3 10*3/uL — ABNORMAL LOW (ref 4.0–10.5)
nRBC: 0 % (ref 0.0–0.2)

## 2021-03-25 LAB — TSH: TSH: 1.241 u[IU]/mL (ref 0.308–3.960)

## 2021-03-25 MED ORDER — SODIUM CHLORIDE 0.9 % IV SOLN
1500.0000 mg | Freq: Once | INTRAVENOUS | Status: AC
  Administered 2021-03-25: 1500 mg via INTRAVENOUS
  Filled 2021-03-25: qty 30

## 2021-03-25 MED ORDER — SODIUM CHLORIDE 0.9 % IV SOLN: Freq: Once | INTRAVENOUS | Status: AC

## 2021-03-25 NOTE — Patient Instructions (Signed)
[facility] MEDICAL [facility]COL[facility]   Discharge Instructions: Thank you for choosing [facility] to provide your oncology and hematology care.   If you have a lab appointment with the Cancer Center, please go directly to the Cancer Center and check in at the registration area.   Wear comfortable clothing and clothing appropriate for easy access to any Portacath or PICC line.   We strive to give you quality time with your provider. You may need to reschedule your appointment if you arrive late (15 or more minutes).  Arriving late affects you and other patients whose appointments are after yours.  Also, if you miss three or more appointments without notifying the office, you may be dismissed from the clinic at the provider's discretion.      For prescription refill requests, have your pharmacy contact our office and allow 72 hours for refills to be completed.    Today you received the following chemotherapy and/or immunotherapy agents: Durvalumab (Imfinzi).      To help prevent nausea and vomiting after your treatment, we encourage you to take your nausea medication as directed.  BELOW ARE SYMPTOMS THAT SHOULD BE REPORTED IMMEDIATELY: *FEVER GREATER THAN 100.4 F (38 C) OR HIGHER *CHILLS OR SWEATING *NAUSEA AND VOMITING THAT IS NOT CONTROLLED WITH YOUR NAUSEA MEDICATION *UNUSUAL SHORTNESS OF BREATH *UNUSUAL BRUISING OR BLEEDING *URINARY PROBLEMS (pain or burning when urinating, or frequent urination) *BOWEL PROBLEMS (unusual diarrhea, constipation, pain near the anus) TENDERNESS IN MOUTH AND THROAT WITH OR WITHOUT PRESENCE OF ULCERS (sore throat, sores in mouth, or a toothache) UNUSUAL RASH, SWELLING OR PAIN  UNUSUAL VAGINAL DISCHARGE OR ITCHING   Items with * indicate a potential emergency and should be followed up as soon as possible or go to the Emergency Department if any problems should occur.  Please show the CHEMOTHERAPY ALERT CARD or IMMUNOTHERAPY ALERT CARD  at check-in to the Emergency Department and triage nurse.  Should you have questions after your visit or need to cancel or reschedule your appointment, please contact [facility]  Dept: [phone]  and follow the prompts.  Office hours are 8:00 a.m. to 4:30 p.m. Monday - Friday. Please note that voicemails left after 4:00 p.m. may not be returned until the following business day.  We are closed weekends and major holidays. You have access to a nurse at all times for urgent questions. Please call the main number to the clinic Dept: [phone] and follow the prompts.   For any non-urgent questions, you may also contact your provider using MyChart. We now offer e-Visits for anyone 18 and older to request care online for non-urgent symptoms. For details visit [email or web address].   Also download the MyChart app! Go to the app store, search "MyChart", open the app, select [facility], and log in with your MyChart username and password.  Due to Covid, a mask is required upon entering the hospital/clinic. If you do not have a mask, one will be given to you upon arrival. For doctor visits, patients may have 1 support person aged 18 or older with them. For treatment visits, patients cannot have anyone with them due to current Covid guidelines and our immunocompromised population.   

## 2021-03-25 NOTE — Telephone Encounter (Signed)
Received message that patient has been unable to get in touch with scheduling to set up genetics appt.  Called patient to set up appt.  LVM w/ contact information.

## 2021-03-26 ENCOUNTER — Telehealth: Payer: Self-pay

## 2021-03-26 NOTE — Telephone Encounter (Signed)
Julie Nicholson states that she is eating, drinking, and urinating well. She gets SOB with exertion which is her baseline. This is not worse since treatment yesterday. Pt knows to call [phone] if she has any questions or concerns.

## 2021-03-26 NOTE — Telephone Encounter (Signed)
-----   Message from Daphane Shepherd, RN sent at 03/25/2021 10:43 AM EST ----- Regarding: first time imfinzi Dr Inda Merlin pt first time imfinzi Dr Inda Merlin pt, tolerated well

## 2021-04-01 ENCOUNTER — Other Ambulatory Visit: Payer: Self-pay

## 2021-04-01 DIAGNOSIS — C3411 Malignant neoplasm of upper lobe, right bronchus or lung: Secondary | ICD-10-CM

## 2021-04-02 ENCOUNTER — Inpatient Hospital Stay: Payer: Commercial Managed Care - PPO

## 2021-04-02 ENCOUNTER — Inpatient Hospital Stay: Payer: Commercial Managed Care - PPO | Admitting: Internal Medicine

## 2021-04-02 ENCOUNTER — Other Ambulatory Visit: Payer: Self-pay

## 2021-04-07 ENCOUNTER — Ambulatory Visit (INDEPENDENT_AMBULATORY_CARE_PROVIDER_SITE_OTHER): Payer: Commercial Managed Care - PPO | Admitting: Emergency Medicine

## 2021-04-07 ENCOUNTER — Encounter: Payer: Self-pay | Admitting: Emergency Medicine

## 2021-04-07 ENCOUNTER — Other Ambulatory Visit: Payer: Self-pay

## 2021-04-07 DIAGNOSIS — R0600 Dyspnea, unspecified: Secondary | ICD-10-CM

## 2021-04-07 DIAGNOSIS — R0602 Shortness of breath: Secondary | ICD-10-CM | POA: Diagnosis not present

## 2021-04-07 DIAGNOSIS — K219 Gastro-esophageal reflux disease without esophagitis: Secondary | ICD-10-CM

## 2021-04-07 DIAGNOSIS — C3411 Malignant neoplasm of upper lobe, right bronchus or lung: Secondary | ICD-10-CM | POA: Diagnosis not present

## 2021-04-07 DIAGNOSIS — Z720 Tobacco use: Secondary | ICD-10-CM | POA: Diagnosis not present

## 2021-04-07 HISTORY — DX: Dyspnea, unspecified: R06.00

## 2021-04-07 MED ORDER — SPIRIVA RESPIMAT 1.25 MCG/ACT IN AERS: 2.0000 | INHALATION_SPRAY | Freq: Every day | RESPIRATORY_TRACT | 0 refills | Status: DC

## 2021-04-07 MED ORDER — PANTOPRAZOLE SODIUM 40 MG PO TBEC: 40.0000 mg | DELAYED_RELEASE_TABLET | Freq: Every day | ORAL | 5 refills | Status: DC

## 2021-04-07 NOTE — Assessment & Plan Note (Signed)
Clinical improvement and radiographical improvement, now on consolidation Imfinzi and following with Dr. Julien Nordmann. ?

## 2021-04-07 NOTE — Assessment & Plan Note (Signed)
With recent radiation esophagitis.  She wants to stay on a PPI, we will refill this for her today. ?

## 2021-04-07 NOTE — Addendum Note (Signed)
Addended by: Gavin Potters R on: 04/07/2021 10:25 AM ? ? Modules accepted: Orders ? ?

## 2021-04-07 NOTE — Patient Instructions (Addendum)
We will do a trial of Spiriva Respimat.  Try 2 puffs once daily.  Take it every day on a schedule.  Keep track of whether this medication helps you and your breathing.  If so we will continue it going forward. ?Keep albuterol available to use 2 puffs up to every 4 hours if needed for shortness of breath, chest tightness, wheezing.  ?We will perform pulmonary function testing at some point going forward once you are stable from your chemoradiation. ?Continue your pantoprazole (Protonix) 40 mg once daily.  We will refill this for you today. ?Follow with Dr. Julien Nordmann as planned ?Follow with APP in 1 month to assess your breathing on the new medication ?Follow with Dr Lamonte Sakai in 6 months or sooner if you have any problems ? ?

## 2021-04-07 NOTE — Assessment & Plan Note (Signed)
She had a reassuring CT-PA in late January.  No evidence of PE.  I think that this progressive dyspnea is most likely related to undiagnosed COPD.  She has not gotten much benefit from albuterol but is willing to do a trial of Spiriva.  She needs pulmonary function testing at some point when she is stable from all of the acute events surrounding her lung cancer. ? ? ?

## 2021-04-07 NOTE — Assessment & Plan Note (Signed)
She stopped in November 2022. ?

## 2021-04-07 NOTE — Progress Notes (Signed)
? ?Subjective:  ? ? Patient ID: Julie Nicholson, female    DOB: 03/03/1965, 56 y.o.   MRN: [id number] ? ?HPI ? ?ROV 01/15/21 --pleasant 56 year old woman with history tobacco use, allergies, GERD, COPD.  We met in November for an abnormal CT scan of the chest that showed bulky mediastinal lymphadenopathy and 11 mm right upper lobe nodule.  She underwent bronchoscopy EBUS on 12/15/2020 that showed non-small cell lung cancer most consistent with an adenocarcinoma.  She is seeing oncology and radiation oncology > starting chemoradiation.  Today she reports. ?She has stopped smoking, benefited from Wellbutrin and would like to continue it.  Her breathing is good - no dyspnea. She has not needed any albuterol.  ? ? ?ROV 04/07/21 --follow-up visit for 56 year old woman with a history of COPD, GERD, allergies.  She has a new diagnosis of adenocarcinoma doing chemoradiation. Now started on Imfinzi.  ?She is having more fatigue over the last 2-3 weeks, associated with exertional SOB. She has albuterol, has used a few times recently without any real change in how she feels. She ascribes some of this to allergy season - no real cough, some sinus congestion. No wheeze. Uses flonase prn. Protonix qd.  ? ?CT-PA 02/22/2021 reviewed, showed no evidence of pulmonary embolism, decreasing right upper lobe pulmonary nodule and bulky right hilar lymphadenopathy. ? 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  Spiculated right upper lobe nodule stable to slightly smaller.  No new findings. ? ? ? ?Review of Systems ?As per HPI ? ?Past Medical History:  ?Diagnosis Date  ? Abdominal discomfort   ? Cancer [facility])   ? Chronic headaches   ? due to allergies, sinus  ? COPD (chronic obstructive pulmonary disease) ([place])   ? per 2012 chest xray   pt states she doesn not have this now (04/10/2013)  ? Deviated nasal septum   ? Eustachian tube dysfunction   ? GERD  (gastroesophageal reflux disease)   ? occasional uses Tums / Rolaids  ? Hearing loss   ? right ear  ? High cholesterol   ? History of radiation therapy   ? right lung 01/07/2021-02/19/2021  Dr Gery Pray  ? Migraine   ? "only once in a blue moon since RX'd allergy shots" (04/10/2013)  ? Pancreatitis 02/08/2013  ? Pneumonia   ? Rhinitis, allergic   ?  ? ?Family History  ?Problem Relation Age of Onset  ? Diabetes Father   ? Diabetes Mother   ? COPD Mother   ? COPD Maternal Grandfather   ? COPD Maternal Aunt   ? COPD Maternal Uncle   ?  ? ?Social History  ? ?Socioeconomic History  ? Marital status: Married  ?  Spouse name: Not on file  ? Number of children: Not on file  ? Years of education: Not on file  ? Highest education level: Not on file  ?Occupational History  ? Not on file  ?Tobacco Use  ? Smoking status: Former  ?  Packs/day: 1.00  ?  Years: 41.00  ?  Pack years: 41.00  ?  Types: Cigarettes  ?  Quit date: 12/23/2020  ?  Years since quitting: 0.2  ? Smokeless tobacco: Never  ?Vaping Use  ? Vaping Use: Never used  ?Substance and Sexual Activity  ? Alcohol use: Not Currently  ?  Comment: occasional  ? Drug use: No  ? Sexual activity: Yes  ?  Birth control/protection:  Surgical  ?  Comment: Hysterectomy  ?Other Topics Concern  ? Not on file  ?Social History Narrative  ? Not on file  ? ?Social Determinants of Health  ? ?Financial Resource Strain: Not on file  ?Food Insecurity: Not on file  ?Transportation Needs: Not on file  ?Physical Activity: Not on file  ?Stress: Not on file  ?Social Connections: Not on file  ?Intimate Partner Violence: Not on file  ?  ? ?Allergies  ?Allergen Reactions  ? Benadryl [Diphenhydramine] Other (See Comments)  ?  Oversedation - 50 mg IV given 01/05/21; switched to IV Certirizine.  ?  ? ?Outpatient Medications Prior to Visit  ?Medication Sig Dispense Refill  ? acetaminophen (TYLENOL) 500 MG tablet Take 1,000 mg by mouth every 6 (six) hours as needed for moderate pain.    ? albuterol  (VENTOLIN HFA) 108 (90 Base) MCG/ACT inhaler Inhale 1-2 puffs into the lungs every 6 (six) hours as needed for wheezing or shortness of breath. 6.7 g 0  ? aspirin EC 81 MG tablet Take 81 mg by mouth at bedtime.    ? atorvastatin (LIPITOR) 40 MG tablet Take 40 mg by mouth at bedtime.    ? benzonatate (TESSALON) 100 MG capsule Take 1-2 capsules (100-200 mg total) by mouth 3 (three) times daily as needed for cough. 20 capsule 0  ? Biotin 5000 MCG TABS Take 5,000 mcg by mouth at bedtime.    ? buPROPion (WELLBUTRIN SR) 150 MG 12 hr tablet Take 1 tablet (150 mg total) by mouth 2 (two) times daily. 60 tablet 5  ? Ca Carbonate-Mag Hydroxide (ROLAIDS PO) Take 1 tablet by mouth daily as needed (heartburn).    ? fluticasone (FLONASE) 50 MCG/ACT nasal spray Place 2 sprays into both nostrils daily as needed for allergies.    ? lidocaine (XYLOCAINE) 2 % solution Swish/gargle as directed 15 mLs in the mouth or throat every 4 (four) hours as needed for mouth pain (throat pain). 100 mL 0  ? Multiple Vitamins-Minerals (HAIR SKIN AND NAILS FORMULA PO) Take 1 tablet by mouth daily.    ? Oxycodone HCl 10 MG TABS Take 1 tablet by mouth 3 (three) times daily as needed (pain).    ? prochlorperazine (COMPAZINE) 10 MG tablet Take 1 tablet (10 mg total) by mouth every 6 (six) hours as needed. 30 tablet 2  ? sucralfate (CARAFATE) 1 g tablet Dissolve 1 tablet in 10 mL H20 and swallow 30 min prior to meals and bedtime. 90 tablet 0  ? UBRELVY 100 MG TABS Take 100 mg by mouth daily as needed (migraine).    ? pantoprazole (PROTONIX) 40 MG tablet Take 1 tablet (40 mg total) by mouth daily. 30 tablet 0  ? ?No facility-administered medications prior to visit.  ? ? ? ?   ?Objective:  ? Physical Exam ?Vitals:  ? 04/07/21 0949  ?BP: 118/68  ?Pulse: (!) 112  ?Temp: 98.3 ?F (36.8 ?C)  ?TempSrc: Oral  ?SpO2: 97%  ?Weight: 98 lb 3.2 oz (44.5 kg)  ?Height: '5\' 2"'  (1.575 m)  ? ? ?Gen: Pleasant, well-nourished, in no distress,  normal affect ? ?ENT: No lesions,   mouth clear,  oropharynx clear, no postnasal drip ? ?Neck: No JVD, no stridor ? ?Lungs: No use of accessory muscles, no crackles or wheezing on normal respiration, no wheeze on forced expiration ? ?Cardiovascular: RRR, heart sounds normal, no murmur or gallops, no peripheral edema ? ?Musculoskeletal: No deformities, no cyanosis or clubbing ? ?Neuro: alert, awake, non  focal ? ?Skin: Warm, no lesions or rash ? ? ? ?   ?Assessment & Plan:  ? ?Primary adenocarcinoma of upper lobe of right lung ([place]) ?Clinical improvement and radiographical improvement, now on consolidation Imfinzi and following with Dr. Julien Nordmann. ? ?GERD (gastroesophageal reflux disease) ?With recent radiation esophagitis.  She wants to stay on a PPI, we will refill this for her today. ? ?Tobacco use ?She stopped in November 2022. ? ?Dyspnea ?She had a reassuring CT-PA in late January.  No evidence of PE.  I think that this progressive dyspnea is most likely related to undiagnosed COPD.  She has not gotten much benefit from albuterol but is willing to do a trial of Spiriva.  She needs pulmonary function testing at some point when she is stable from all of the acute events surrounding her lung cancer. ? ? ? ? ?Baltazar Apo, MD, PhD ?04/07/2021, 10:15 AM ?[facility] ?[phone] or if no answer before 7:00PM call [phone] ?For any issues after 7:00PM please call eLink [phone] ? ? ?

## 2021-04-08 ENCOUNTER — Telehealth: Payer: Self-pay

## 2021-04-08 NOTE — Telephone Encounter (Signed)
Patient notified of completion of requested Disability Form. Copy forwarded to [facility] with signed Release of Information request. Copy of forms mailed to Patient as requested. ?

## 2021-04-10 ENCOUNTER — Telehealth: Payer: Self-pay | Admitting: Physician Assistant

## 2021-04-10 NOTE — Telephone Encounter (Signed)
Scheduled per 02/15 los, patient has been called and notified of upcoming appointments. ?

## 2021-04-13 ENCOUNTER — Other Ambulatory Visit: Payer: Self-pay

## 2021-04-13 ENCOUNTER — Inpatient Hospital Stay: Payer: Commercial Managed Care - PPO | Attending: Internal Medicine | Admitting: General Practice

## 2021-04-13 ENCOUNTER — Inpatient Hospital Stay: Payer: Commercial Managed Care - PPO | Admitting: General Practice

## 2021-04-13 DIAGNOSIS — I959 Hypotension, unspecified: Secondary | ICD-10-CM | POA: Insufficient documentation

## 2021-04-13 DIAGNOSIS — C3411 Malignant neoplasm of upper lobe, right bronchus or lung: Secondary | ICD-10-CM | POA: Insufficient documentation

## 2021-04-13 DIAGNOSIS — Z5112 Encounter for antineoplastic immunotherapy: Secondary | ICD-10-CM | POA: Insufficient documentation

## 2021-04-13 DIAGNOSIS — Z79899 Other long term (current) drug therapy: Secondary | ICD-10-CM | POA: Insufficient documentation

## 2021-04-13 NOTE — Progress Notes (Signed)
[place] [facility]ual Care Note ? ?Ms Sher completed her Advance Directives at [facility] today. She has named two health care agents, Meredith Pel and Shelda Altes. In the event that her health care agent gives instructions that differ from the desires expressed in the living will, she instructs her team to follow the living will. ? ?Copies have been provided to her, and one will go directly to Health Information Management to be scanned into her [facility] medical record. ? ? ?Chaplain Lorrin Jackson, MDiv, [facility] ?Pager [phone] ?Voicemail [phone]  ?

## 2021-04-20 NOTE — Progress Notes (Signed)
[place] ?OFFICE PROGRESS NOTE ? ?Julie Beals, NP ?[address] ?[place] [address] ? ?DIAGNOSIS: Stage IIIB  (T1b, N3, M0) non-small cell lung cancer, adenocarcinoma she presented with right upper lobe nodule in addition to bulky right hilar, mediastinal, and left supraclavicular lymphadenopathy ? ?DETECTED ALTERATION(S) / BIOMARKER(S)     % CFDNA OR AMPLIFICATION       ASSOCIATED FDA-APPROVED THERAPIES        CLINICAL TRIAL AVAILABILITY ?[id number] ND 0.5 5 ?50 ?100 ?4.7% ?  ?RHOAG17E ?ND ?0.5 ?5 ?50 ?100 ?1.8% ?  ?[id number] ?ND ?0.5 ?5 ?50 ?100 ?1.9% ?  ?BIOMARKER  ADDITIONAL DETAILS ?Tumor Mutational Burden (TMB) ?19.02 mut/Mb ?MSI Status ?Stable (MSS) ?PD-L1 Tumor Proportion Score (TPS)* ?<1% ? ?PRIOR THERAPY: Concurrent chemoradiation with carboplatin for an AUC of 2 and paclitaxel 45 mg per metered squared.  First dose on 01/05/2021.  Status post 7 cycles of treatment.  Last dose was given February 16, 2021. ? ?CURRENT THERAPY: Consolidation immunotherapy with Imfinzi 1500 Mg IV every 4 weeks.  First dose March 25, 2021.  Status post 1 cycle. ? ?INTERVAL HISTORY: ?Julie Nicholson 56 y.o. female returns to the clinic today for a follow-up visit.  The patient is feeling fairly well today without any concerning complaints.  The patient had had some significant dysphagia/odynophagia from her prior radiation treatment which continues to improve.  She is currently undergoing consolidation immunotherapy.  She underwent her first cycle of treatment last month and she tolerated well without any appreciable adverse side effects.  ? ?She has seen her pulmonologist recently. She notes her dyspnea on exertion is worse from the course of her treatment. She works in the hospital with total care patients. She is concerned she is going to have a hard time working with her COPD and labor intensive job. She is wondering what our opinion is regarding disability. Dr. Lamonte Sakai discussed repeating  her PFTs in the future.  ? ?She has allergies and noted increased mucus and nasal congestion. She uses Flonase. She sees ENT next month. She is not taking an anti-histamine. She has a cough related to mucus. Denies any nausea, vomiting, diarrhea, or constipation.  Denies any rashes or skin changes.  Denies any unusual headache besides her usual headache due to her sinuses.  She is here today for evaluation and repeat blood work before starting cycle #2. ? ? ?MEDICAL HISTORY: ?Past Medical History:  ?Diagnosis Date  ? Abdominal discomfort   ? Cancer [facility])   ? Chronic headaches   ? due to allergies, sinus  ? COPD (chronic obstructive pulmonary disease) ([place])   ? per 2012 chest xray   pt states she doesn not have this now (04/10/2013)  ? Deviated nasal septum   ? Eustachian tube dysfunction   ? GERD (gastroesophageal reflux disease)   ? occasional uses Tums / Rolaids  ? Hearing loss   ? right ear  ? High cholesterol   ? History of radiation therapy   ? right lung 01/07/2021-02/19/2021  Dr Gery Pray  ? Migraine   ? "only once in a blue moon since RX'd allergy shots" (04/10/2013)  ? Pancreatitis 02/08/2013  ? Pneumonia   ? Rhinitis, allergic   ? ? ?ALLERGIES:  is allergic to benadryl [diphenhydramine]. ? ?MEDICATIONS:  ?Current Outpatient Medications  ?Medication Sig Dispense Refill  ? acetaminophen (TYLENOL) 500 MG tablet Take 1,000 mg by mouth every 6 (six) hours as needed for moderate pain.    ? albuterol (VENTOLIN  HFA) 108 (90 Base) MCG/ACT inhaler Inhale 1-2 puffs into the lungs every 6 (six) hours as needed for wheezing or shortness of breath. 6.7 g 0  ? aspirin EC 81 MG tablet Take 81 mg by mouth at bedtime.    ? atorvastatin (LIPITOR) 40 MG tablet Take 40 mg by mouth at bedtime.    ? benzonatate (TESSALON) 100 MG capsule Take 1-2 capsules (100-200 mg total) by mouth 3 (three) times daily as needed for cough. 20 capsule 0  ? Biotin 5000 MCG TABS Take 5,000 mcg by mouth at bedtime.    ? buPROPion (WELLBUTRIN SR) 150  MG 12 hr tablet Take 1 tablet (150 mg total) by mouth 2 (two) times daily. 60 tablet 5  ? Ca Carbonate-Mag Hydroxide (ROLAIDS PO) Take 1 tablet by mouth daily as needed (heartburn).    ? fluticasone (FLONASE) 50 MCG/ACT nasal spray Place 2 sprays into both nostrils daily as needed for allergies.    ? lidocaine (XYLOCAINE) 2 % solution Swish/gargle as directed 15 mLs in the mouth or throat every 4 (four) hours as needed for mouth pain (throat pain). 100 mL 0  ? Multiple Vitamins-Minerals (HAIR SKIN AND NAILS FORMULA PO) Take 1 tablet by mouth daily.    ? Oxycodone HCl 10 MG TABS Take 1 tablet by mouth 3 (three) times daily as needed (pain).    ? pantoprazole (PROTONIX) 40 MG tablet Take 1 tablet (40 mg total) by mouth daily. 30 tablet 5  ? prochlorperazine (COMPAZINE) 10 MG tablet Take 1 tablet (10 mg total) by mouth every 6 (six) hours as needed. 30 tablet 2  ? sucralfate (CARAFATE) 1 g tablet Dissolve 1 tablet in 10 mL H20 and swallow 30 min prior to meals and bedtime. 90 tablet 0  ? Tiotropium Bromide Monohydrate (SPIRIVA RESPIMAT) 1.25 MCG/ACT AERS Inhale 2 puffs into the lungs daily. 4 g 0  ? UBRELVY 100 MG TABS Take 100 mg by mouth daily as needed (migraine).    ? ?No current facility-administered medications for this visit.  ? ? ?SURGICAL HISTORY:  ?Past Surgical History:  ?Procedure Laterality Date  ? ABDOMINAL HYSTERECTOMY  1995  ? tx endometriosis, both ovaries removed  ? APPENDECTOMY  late 1990's  ? BIOPSY  02/26/2021  ? Procedure: BIOPSY;  Surgeon: Milus Banister, MD;  Location: [facility];  Service: Endoscopy;;  ? BRONCHIAL BRUSHINGS  12/15/2020  ? Procedure: BRONCHIAL BRUSHINGS;  Surgeon: Collene Gobble, MD;  Location: [facility] ENDOSCOPY;  Service: Cardiopulmonary;;  ? BRONCHIAL NEEDLE ASPIRATION BIOPSY  12/15/2020  ? Procedure: BRONCHIAL NEEDLE ASPIRATION BIOPSIES;  Surgeon: Collene Gobble, MD;  Location: [facility] ENDOSCOPY;  Service: Cardiopulmonary;;  ? CHOLECYSTECTOMY  04/10/2013  ? CHOLECYSTECTOMY N/A  04/10/2013  ? Procedure: LAPAROSCOPIC CHOLECYSTECTOMY WITH INTRAOPERATIVE CHOLANGIOGRAM;  Surgeon: Odis Hollingshead, MD;  Location: [facility];  Service: General;  Laterality: N/A;  ? ELECTROMAGNETIC NAVIGATION BROCHOSCOPY  12/15/2020  ? Procedure: ELECTROMAGNETIC NAVIGATION BRONCHOSCOPY;  Surgeon: Collene Gobble, MD;  Location: [facility] ENDOSCOPY;  Service: Cardiopulmonary;;  ? ESOPHAGOGASTRODUODENOSCOPY (EGD) WITH PROPOFOL N/A 02/26/2021  ? Procedure: ESOPHAGOGASTRODUODENOSCOPY (EGD) WITH PROPOFOL;  Surgeon: Milus Banister, MD;  Location: [facility] ENDOSCOPY;  Service: Endoscopy;  Laterality: N/A;  ? EUS N/A 02/16/2013  ? Procedure: UPPER ENDOSCOPIC ULTRASOUND (EUS) LINEAR;  Surgeon: Milus Banister, MD;  Location: [facility] ENDOSCOPY;  Service: Endoscopy;  Laterality: N/A;  radial linear  ? KNEE ARTHROSCOPY Right 1980's  ? "cartilage OR"  ? LAPAROSCOPIC ENDOMETRIOSIS FULGURATION  1980's  ? MYRINGOTOMY  WITH TUBE PLACEMENT Right 07/13/2018  ? Procedure: MYRINGOTOMY WITH TUBE PLACEMENT;  Surgeon: Margaretha Sheffield, MD;  Location: [facility];  Service: ENT;  Laterality: Right;  ? NASOPHARYNGOSCOPY EUSTATION TUBE BALLOON DILATION Right 07/13/2018  ? Procedure: NASOPHARYNGOSCOPY EUSTATION TUBE BALLOON DILATION;  Surgeon: Margaretha Sheffield, MD;  Location: [facility];  Service: ENT;  Laterality: Right;  ? TONSILLECTOMY AND ADENOIDECTOMY  ~ 1980  ? adenoidectomy  ? TUBAL LIGATION  ~ 1987  ? TURBINATE REDUCTION Right 07/13/2018  ? Procedure: OUTFRACTURE TURBINATE;  Surgeon: Margaretha Sheffield, MD;  Location: [facility];  Service: ENT;  Laterality: Right;  ? VIDEO BRONCHOSCOPY WITH ENDOBRONCHIAL ULTRASOUND N/A 12/15/2020  ? Procedure: ROBOTIC VIDEO BRONCHOSCOPY WITH ENDOBRONCHIAL ULTRASOUND;  Surgeon: Collene Gobble, MD;  Location: [facility];  Service: Cardiopulmonary;  Laterality: N/A;  ? WRIST SURGERY Left   ? w/plate  ? ? ?REVIEW OF SYSTEMS:   ?Review of Systems  ?Constitutional: Positive for fatigue. Negative for appetite  change, chills, fever and unexpected weight change.  ?HENT: Positive for nasal congestion. Negative for mouth sores, nosebleeds, sore throat and trouble swallowing.   ?Eyes: Negative for eye problems and

## 2021-04-22 ENCOUNTER — Inpatient Hospital Stay: Payer: Commercial Managed Care - PPO

## 2021-04-22 ENCOUNTER — Other Ambulatory Visit: Payer: Self-pay

## 2021-04-22 ENCOUNTER — Inpatient Hospital Stay (HOSPITAL_BASED_OUTPATIENT_CLINIC_OR_DEPARTMENT_OTHER): Payer: Commercial Managed Care - PPO | Admitting: Physician Assistant

## 2021-04-22 ENCOUNTER — Inpatient Hospital Stay: Payer: Commercial Managed Care - PPO | Admitting: Dietician

## 2021-04-22 VITALS — BP 97/67 | HR 96 | Temp 97.3°F | Resp 17 | Wt 100.7 lb

## 2021-04-22 DIAGNOSIS — C3411 Malignant neoplasm of upper lobe, right bronchus or lung: Secondary | ICD-10-CM

## 2021-04-22 DIAGNOSIS — Z79899 Other long term (current) drug therapy: Secondary | ICD-10-CM | POA: Diagnosis not present

## 2021-04-22 DIAGNOSIS — I959 Hypotension, unspecified: Secondary | ICD-10-CM | POA: Diagnosis not present

## 2021-04-22 DIAGNOSIS — Z5112 Encounter for antineoplastic immunotherapy: Secondary | ICD-10-CM | POA: Diagnosis present

## 2021-04-22 LAB — TSH: TSH: 1.896 u[IU]/mL (ref 0.308–3.960)

## 2021-04-22 LAB — CBC WITH DIFFERENTIAL (CANCER CENTER ONLY)
Abs Immature Granulocytes: 0.01 10*3/uL (ref 0.00–0.07)
Basophils Absolute: 0 10*3/uL (ref 0.0–0.1)
Basophils Relative: 0 %
Eosinophils Absolute: 0.2 10*3/uL (ref 0.0–0.5)
Eosinophils Relative: 5 %
HCT: 34.7 % — ABNORMAL LOW (ref 36.0–46.0)
Hemoglobin: 11.4 g/dL — ABNORMAL LOW (ref 12.0–15.0)
Immature Granulocytes: 0 %
Lymphocytes Relative: 19 %
Lymphs Abs: 0.7 10*3/uL (ref 0.7–4.0)
MCH: 33.5 pg (ref 26.0–34.0)
MCHC: 32.9 g/dL (ref 30.0–36.0)
MCV: 102.1 fL — ABNORMAL HIGH (ref 80.0–100.0)
Monocytes Absolute: 0.4 10*3/uL (ref 0.1–1.0)
Monocytes Relative: 11 %
Neutro Abs: 2.4 10*3/uL (ref 1.7–7.7)
Neutrophils Relative %: 65 %
Platelet Count: 212 10*3/uL (ref 150–400)
RBC: 3.4 MIL/uL — ABNORMAL LOW (ref 3.87–5.11)
RDW: 12.5 % (ref 11.5–15.5)
WBC Count: 3.6 10*3/uL — ABNORMAL LOW (ref 4.0–10.5)
nRBC: 0 % (ref 0.0–0.2)

## 2021-04-22 LAB — CMP (CANCER CENTER ONLY)
ALT: 12 U/L (ref 0–44)
AST: 15 U/L (ref 15–41)
Albumin: 3.8 g/dL (ref 3.5–5.0)
Alkaline Phosphatase: 120 U/L (ref 38–126)
Anion gap: 10 (ref 5–15)
BUN: 14 mg/dL (ref 6–20)
CO2: 25 mmol/L (ref 22–32)
Calcium: 9.9 mg/dL (ref 8.9–10.3)
Chloride: 104 mmol/L (ref 98–111)
Creatinine: 0.74 mg/dL (ref 0.44–1.00)
GFR, Estimated: >60 mL/min (ref >60)
Glucose, Bld: 91 mg/dL (ref 70–99)
Potassium: 4.1 mmol/L (ref 3.5–5.1)
Sodium: 139 mmol/L (ref 135–145)
Total Bilirubin: 0.5 mg/dL (ref 0.3–1.2)
Total Protein: 7.5 g/dL (ref 6.5–8.1)

## 2021-04-22 MED ORDER — SODIUM CHLORIDE 0.9 % IV SOLN: Freq: Once | INTRAVENOUS | Status: AC

## 2021-04-22 MED ORDER — SODIUM CHLORIDE 0.9 % IV SOLN
1500.0000 mg | Freq: Once | INTRAVENOUS | Status: AC
  Administered 2021-04-22: 1500 mg via INTRAVENOUS
  Filled 2021-04-22: qty 20

## 2021-04-22 NOTE — Patient Instructions (Signed)
[facility]   Discharge Instructions: Thank you for choosing [facility] to provide your oncology and hematology care.   If you have a lab appointment with the Cancer Center, please go directly to the Cancer Center and check in at the registration area.   Wear comfortable clothing and clothing appropriate for easy access to any Portacath or PICC line.   We strive to give you quality time with your provider. You may need to reschedule your appointment if you arrive late (15 or more minutes).  Arriving late affects you and other patients whose appointments are after yours.  Also, if you miss three or more appointments without notifying the office, you may be dismissed from the clinic at the provider's discretion.      For prescription refill requests, have your pharmacy contact our office and allow 72 hours for refills to be completed.    Today you received the following chemotherapy and/or immunotherapy agents: Durvalumab (Imfinzi).      To help prevent nausea and vomiting after your treatment, we encourage you to take your nausea medication as directed.  BELOW ARE SYMPTOMS THAT SHOULD BE REPORTED IMMEDIATELY: *FEVER GREATER THAN 100.4 F (38 C) OR HIGHER *CHILLS OR SWEATING *NAUSEA AND VOMITING THAT IS NOT CONTROLLED WITH YOUR NAUSEA MEDICATION *UNUSUAL SHORTNESS OF BREATH *UNUSUAL BRUISING OR BLEEDING *URINARY PROBLEMS (pain or burning when urinating, or frequent urination) *BOWEL PROBLEMS (unusual diarrhea, constipation, pain near the anus) TENDERNESS IN MOUTH AND THROAT WITH OR WITHOUT PRESENCE OF ULCERS (sore throat, sores in mouth, or a toothache) UNUSUAL RASH, SWELLING OR PAIN  UNUSUAL VAGINAL DISCHARGE OR ITCHING   Items with * indicate a potential emergency and should be followed up as soon as possible or go to the Emergency Department if any problems should occur.  Please show the CHEMOTHERAPY ALERT CARD or IMMUNOTHERAPY ALERT CARD  at check-in to the Emergency Department and triage nurse.  Should you have questions after your visit or need to cancel or reschedule your appointment, please contact [facility]  Dept: [phone]  and follow the prompts.  Office hours are 8:00 a.m. to 4:30 p.m. Monday - Friday. Please note that voicemails left after 4:00 p.m. may not be returned until the following business day.  We are closed weekends and major holidays. You have access to a nurse at all times for urgent questions. Please call the main number to the clinic Dept: [phone] and follow the prompts.   For any non-urgent questions, you may also contact your provider using MyChart. We now offer e-Visits for anyone 18 and older to request care online for non-urgent symptoms. For details visit [email or web address].   Also download the MyChart app! Go to the app store, search "MyChart", open the app, select [facility], and log in with your MyChart username and password.  Due to Covid, a mask is required upon entering the hospital/clinic. If you do not have a mask, one will be given to you upon arrival. For doctor visits, patients may have 1 support person aged 18 or older with them. For treatment visits, patients cannot have anyone with them due to current Covid guidelines and our immunocompromised population.   

## 2021-05-01 NOTE — Telephone Encounter (Signed)
error 

## 2021-05-08 ENCOUNTER — Ambulatory Visit (INDEPENDENT_AMBULATORY_CARE_PROVIDER_SITE_OTHER): Payer: Commercial Managed Care - PPO | Admitting: Nurse Practitioner

## 2021-05-08 ENCOUNTER — Encounter: Payer: Self-pay | Admitting: Nurse Practitioner

## 2021-05-08 ENCOUNTER — Telehealth: Payer: Self-pay | Admitting: Nurse Practitioner

## 2021-05-08 VITALS — BP 100/66 | HR 105 | Ht 62.0 in | Wt 101.0 lb

## 2021-05-08 DIAGNOSIS — R058 Other specified cough: Secondary | ICD-10-CM

## 2021-05-08 DIAGNOSIS — J309 Allergic rhinitis, unspecified: Secondary | ICD-10-CM

## 2021-05-08 DIAGNOSIS — J449 Chronic obstructive pulmonary disease, unspecified: Secondary | ICD-10-CM | POA: Insufficient documentation

## 2021-05-08 DIAGNOSIS — C3411 Malignant neoplasm of upper lobe, right bronchus or lung: Secondary | ICD-10-CM

## 2021-05-08 DIAGNOSIS — J3089 Other allergic rhinitis: Secondary | ICD-10-CM | POA: Diagnosis not present

## 2021-05-08 DIAGNOSIS — J432 Centrilobular emphysema: Secondary | ICD-10-CM

## 2021-05-08 HISTORY — DX: Allergic rhinitis, unspecified: J30.9

## 2021-05-08 HISTORY — DX: Other specified cough: R05.8

## 2021-05-08 MED ORDER — PREDNISONE 20 MG PO TABS: 40.0000 mg | ORAL_TABLET | Freq: Every day | ORAL | 0 refills | Status: AC

## 2021-05-08 MED ORDER — LORATADINE 10 MG PO TABS: 10.0000 mg | ORAL_TABLET | Freq: Every day | ORAL | 5 refills | Status: DC

## 2021-05-08 MED ORDER — ALBUTEROL SULFATE HFA 108 (90 BASE) MCG/ACT IN AERS: 1.0000 | INHALATION_SPRAY | Freq: Four times a day (QID) | RESPIRATORY_TRACT | 5 refills | Status: DC | PRN

## 2021-05-08 MED ORDER — SPIRIVA RESPIMAT 1.25 MCG/ACT IN AERS: 2.0000 | INHALATION_SPRAY | Freq: Every day | RESPIRATORY_TRACT | 0 refills | Status: DC

## 2021-05-08 MED ORDER — ALBUTEROL SULFATE (2.5 MG/3ML) 0.083% IN NEBU: 2.5000 mg | INHALATION_SOLUTION | Freq: Four times a day (QID) | RESPIRATORY_TRACT | 5 refills | Status: DC | PRN

## 2021-05-08 NOTE — Addendum Note (Signed)
Addended by: Clayton Bibles on: 05/08/2021 05:40 PM ? ? Modules accepted: Orders ? ?

## 2021-05-08 NOTE — Progress Notes (Addendum)
? ?@Patient  ID: Julie Nicholson, female    DOB: 10-25-1965, 56 y.o.   MRN: [id number] ? ?Chief Complaint  ?Patient presents with  ? Follow-up  ?  Copd, unable to take a deep breath  ? ? ?Referring provider: ?[name], NP ? ?HPI: ?56 year old female, former smoker (41 pack years) followed for DOE and adenocarcinoma of RUL. She is a patient of Dr. Agustina Caroli and last seen in office on 04/07/2021. She is currently on Imfinzi and followed by Dr. Julien Nordmann with oncology. Past medical history significant for GERD r/t radiation esophagitis, HLD, chronic cholecystitis.  ? ?TEST/EVENTS:  ?03/13/2021 CT chest w contrast: stable right hilar adenopathy. Previously demonstrated subcarinal adenopathy resolved. Underlying moderate centrilobular and paraseptal emphysema with stable biapical scarring. RUL measures 8x9 mm, decreased in size when compared to previous imaging. Few scattered ground-glass densities in both lungs, like inflammatory or related to current treatment. No evidence of progressive disease.  ? ?04/07/2021: OV with Dr. Lamonte Sakai. Quit smoking in November with Wellbutrin. Followed by oncology for adenocarinoma of right lung and on Imfinzi. Felt more fatigued and some progressive DOE - suspected r/t undiagnosed COPD. Initiated trial with Spiriva. 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  ? ?05/08/2021: Today - follow up ?Patient presents today for follow up after being started on a trial of Spiriva for DOE and suspected COPD. She reports that she has been having difficulties with taking the Spiriva because of her cough. Most of the times when she goes to take it, she starts coughing before she can inhale the medicine and feels like she blows it all back out. When she doesn't cough, she does feel like she receives good benefit from the Spiriva and that it improves her DOE. Other than with inhaler therapy, she reports frequent dry, hacking cough and some chest tightness with deep breathing. She feels like her breathing  and chest tightness are unchanged from over the past few months, but does feel like her cough is slightly more frequent. She also notes that her allergies have been worse and she feels like she gets more short of breath when she goes outside. She denies hemoptysis, orthopnea, PND, chest pain or wheezing. She receives good benefit from the tessalon perles. Feels like GERD well-controlled on protonix.  ? ?Allergies  ?Allergen Reactions  ? Benadryl [Diphenhydramine] Other (See Comments)  ?  Oversedation - 50 mg IV given 01/05/21; switched to IV Certirizine.  ? ? ?Immunization History  ?Administered Date(s) Administered  ? Influenza Split 09/01/2012  ? Influenza,inj,quad, With Preservative 11/22/2019  ? Influenza-Unspecified 12/02/2020  ? Moderna Sars-Covid-2 Vaccination 02/05/2019, 03/05/2019, 03/06/2020  ? Pneumococcal Polysaccharide-23 04/11/2013  ? Td 09/01/2012  ? Tdap 08/20/2015  ? ? ?Past Medical History:  ?Diagnosis Date  ? Abdominal discomfort   ? Cancer [facility])   ? Chronic headaches   ? due to allergies, sinus  ? COPD (chronic obstructive pulmonary disease) ([place])   ? per 2012 chest xray   pt states she doesn not have this now (04/10/2013)  ? Deviated nasal septum   ? Eustachian tube dysfunction   ? GERD (gastroesophageal reflux disease)   ? occasional uses Tums / Rolaids  ? Hearing loss   ? right ear  ? High cholesterol   ? History of radiation therapy   ? right lung 01/07/2021-02/19/2021  Dr Gery Pray  ? Migraine   ? "only once in a blue moon since RX'd allergy shots" (04/10/2013)  ? Pancreatitis 02/08/2013  ? Pneumonia   ?  Rhinitis, allergic   ? ? ?Tobacco History: ?Social History  ? ?Tobacco Use  ?Smoking Status Former  ? Packs/day: 1.00  ? Years: 41.00  ? Pack years: 41.00  ? Types: Cigarettes  ? Quit date: 12/23/2020  ? Years since quitting: 0.3  ?Smokeless Tobacco Never  ? ?Counseling given: Not Answered ? ? ?Outpatient Medications Prior to Visit  ?Medication Sig Dispense Refill  ? acetaminophen (TYLENOL)  500 MG tablet Take 1,000 mg by mouth every 6 (six) hours as needed for moderate pain.    ? aspirin EC 81 MG tablet Take 81 mg by mouth at bedtime.    ? atorvastatin (LIPITOR) 40 MG tablet Take 40 mg by mouth at bedtime.    ? benzonatate (TESSALON) 100 MG capsule Take 1-2 capsules (100-200 mg total) by mouth 3 (three) times daily as needed for cough. 20 capsule 0  ? Biotin 5000 MCG TABS Take 5,000 mcg by mouth at bedtime.    ? buPROPion (WELLBUTRIN SR) 150 MG 12 hr tablet Take 1 tablet (150 mg total) by mouth 2 (two) times daily. 60 tablet 5  ? Ca Carbonate-Mag Hydroxide (ROLAIDS PO) Take 1 tablet by mouth daily as needed (heartburn).    ? fluticasone (FLONASE) 50 MCG/ACT nasal spray Place 2 sprays into both nostrils daily as needed for allergies.    ? lidocaine (XYLOCAINE) 2 % solution Swish/gargle as directed 15 mLs in the mouth or throat every 4 (four) hours as needed for mouth pain (throat pain). 100 mL 0  ? Multiple Vitamins-Minerals (HAIR SKIN AND NAILS FORMULA PO) Take 1 tablet by mouth daily.    ? Oxycodone HCl 10 MG TABS Take 1 tablet by mouth 3 (three) times daily as needed (pain).    ? prochlorperazine (COMPAZINE) 10 MG tablet Take 1 tablet (10 mg total) by mouth every 6 (six) hours as needed. 30 tablet 2  ? sucralfate (CARAFATE) 1 g tablet Dissolve 1 tablet in 10 mL H20 and swallow 30 min prior to meals and bedtime. 90 tablet 0  ? Tiotropium Bromide Monohydrate (SPIRIVA RESPIMAT) 1.25 MCG/ACT AERS Inhale 2 puffs into the lungs daily. 4 g 0  ? UBRELVY 100 MG TABS Take 100 mg by mouth daily as needed (migraine).    ? pantoprazole (PROTONIX) 40 MG tablet Take 1 tablet (40 mg total) by mouth daily. 30 tablet 5  ? albuterol (VENTOLIN HFA) 108 (90 Base) MCG/ACT inhaler Inhale 1-2 puffs into the lungs every 6 (six) hours as needed for wheezing or shortness of breath. (Patient not taking: Reported on 05/08/2021) 6.7 g 0  ? ?No facility-administered medications prior to visit.  ? ? ? ?Review of Systems:   ? ?Constitutional: No weight loss or gain, night sweats, fevers, chills. +fatigue ?HEENT: No headaches, difficulty swallowing, tooth/dental problems, or sore throat. No itching, ear ache. +occasional sneezing, occasional nasal congestion - both worse when outside ?CV:  No chest pain, orthopnea, PND, swelling in lower extremities, anasarca, dizziness, palpitations, syncope ?Resp: +shortness of breath with exertion (baseline; some improvement with Spiriva); primarily non-productive cough; chest congestion/tightness. No excess mucus or change in color of mucus. No hemoptysis. No wheezing.  No chest wall deformity ?GI:  No heartburn, indigestion, abdominal pain, nausea, vomiting, diarrhea, change in bowel habits, loss of appetite, bloody stools.  ?GU: No dysuria, change in color of urine, urgency or frequency.  No flank pain, no hematuria  ?Skin: No rash, lesions, ulcerations ?MSK:  No joint pain or swelling.  No decreased range of motion.  No back pain. ?Neuro: No dizziness or lightheadedness.  ?Psych: No depression or anxiety. Mood stable.  ? ? ? ?Physical Exam: ? ?BP 100/66 (BP Location: Right Arm, Cuff Size: Normal)   Pulse (!) 105   Ht 5\' 2"  (1.575 m)   Wt 101 lb (45.8 kg)   SpO2 99%   BMI 18.47 kg/m?  ? ?GEN: Pleasant, interactive, well-kempt; in no acute distress. ?HEENT:  Normocephalic and atraumatic. EACs patent bilaterally. TM pearly gray with present light reflex bilaterally. PERRLA. Sclera white. Nasal turbinates erythematous, moist and patent bilaterally. No rhinorrhea present. Oropharynx erythematous and moist, without exudate or edema. No lesions, ulcerations.  ?NECK:  Supple w/ fair ROM. No JVD present. Normal carotid impulses w/o bruits. Thyroid symmetrical with no goiter or nodules palpated. No lymphadenopathy.   ?CV: RRR, no m/r/g, no peripheral edema. Pulses intact, +2 bilaterally. No cyanosis, pallor or clubbing. ?PULMONARY:  Unlabored, regular breathing. Clear bilaterally A&P w/o  wheezes/rales/rhonchi. No accessory muscle use. No dullness to percussion. ?GI: BS present and normoactive. Soft, non-tender to palpation. No organomegaly or masses detected. No CVA tenderness. ?MSK: No erythema, warmth or tendernes

## 2021-05-08 NOTE — Assessment & Plan Note (Addendum)
Some improvement in DOE with Spiriva Respimat; difficulties with inhaler use d/t cough. Discussed cough control measures prior to inhaler use. Suspect cough multifactorial r/t chronic bronchitis and some upper airway irritation from allergies. Could consider transition to nebulized LAMA if difficulties persist. Slight worsening of cough over the past week with flare in allergy symptoms. Short prednisone burst 40 mg x5. We will obtain formal PFTs for further assessment of lung function. Continues to have difficulties with fatigue and activity tolerance - referral to pulmonary rehab placed.  ? ?Patient Instructions  ?-Continue Spiriva 2 puff daily  ?-Continue benzonatate (tessalon) 1-2 capsules Three times a day as needed for cough ?-Continue flonase 2 sprays each nostril daily for allergies  ?-Continue protonix 40 mg daily  ? ?-Prednisone 40 mg daily for 5 days. Take in AM with food.  ?-Mucinex 600 mg Twice daily  ?-Claritin 10 mg daily for allergies  ?-Albuterol inhaler 2 puffs or 3 mL neb every 6 hours as needed for shortness of breath or wheezing. Notify if symptoms persist despite rescue inhaler/neb use. ?-Delsym 2 tsp Twice daily for cough  ?-Chlorpheniramine 4 mg tab At bedtime for cough  ?-Frequent sips of water. Suck sugar free hard candies throughout the day. Avoid throat clearing as much as possible  ? ?Pulmonary function testing scheduled today.  ? ?Follow up after PFTs with Dr Lamonte Sakai or Joellen Jersey Ayssa Bentivegna,NP. If symptoms do not improve or worsen, please contact office for sooner follow up or seek emergency care. ? ? ?

## 2021-05-08 NOTE — Assessment & Plan Note (Signed)
Initiate trigger prevention with Claritin. Recent CBC with eos 0.2. Can consider singulair if symptoms persist.  ?

## 2021-05-08 NOTE — Patient Instructions (Addendum)
-  Continue Spiriva 2 puff daily  ?-Continue benzonatate (tessalon) 1-2 capsules Three times a day as needed for cough ?-Continue flonase 2 sprays each nostril daily for allergies  ?-Continue protonix 40 mg daily  ? ?-Prednisone 40 mg daily for 5 days. Take in AM with food.  ?-Mucinex 600 mg Twice daily  ?-Claritin 10 mg daily for allergies  ?-Albuterol inhaler 2 puffs or 3 mL neb every 6 hours as needed for shortness of breath or wheezing. Notify if symptoms persist despite rescue inhaler/neb use. ?-Delsym 2 tsp Twice daily for cough  ?-Chlorpheniramine 4 mg tab At bedtime for cough  ?-Frequent sips of water. Suck sugar free hard candies throughout the day. Avoid throat clearing as much as possible  ? ?Pulmonary function testing scheduled today.  ? ?Follow up after PFTs with Dr Lamonte Sakai or Julie Jersey Torre [place]umb[place],NP. If symptoms do not improve or worsen, please contact office for sooner follow up or seek emergency care. ?

## 2021-05-08 NOTE — Assessment & Plan Note (Signed)
Recent imaging reassuring with radiographic improvement. Currently on Imfinzi. Follow up with Dr. Julien Nordmann as scheduled. ?

## 2021-05-08 NOTE — Assessment & Plan Note (Signed)
Well-controlled on current PPI therapy.  ?

## 2021-05-08 NOTE — Telephone Encounter (Signed)
Called patient and she wanted an order for a neb machine she states she got the medication but no the machine. Placed neb order for neb machine. Nothing further  ?

## 2021-05-08 NOTE — Assessment & Plan Note (Addendum)
Trigger prevention with Claritin. Initiate therapies for postnasal drainage and cough control.  ?

## 2021-05-11 ENCOUNTER — Other Ambulatory Visit: Payer: Self-pay | Admitting: Emergency Medicine

## 2021-05-20 ENCOUNTER — Inpatient Hospital Stay: Payer: Commercial Managed Care - PPO

## 2021-05-20 ENCOUNTER — Encounter: Payer: Self-pay | Admitting: *Deleted

## 2021-05-20 ENCOUNTER — Inpatient Hospital Stay: Payer: Commercial Managed Care - PPO | Attending: Internal Medicine

## 2021-05-20 ENCOUNTER — Inpatient Hospital Stay (HOSPITAL_BASED_OUTPATIENT_CLINIC_OR_DEPARTMENT_OTHER): Payer: Commercial Managed Care - PPO | Admitting: Internal Medicine

## 2021-05-20 ENCOUNTER — Inpatient Hospital Stay: Payer: Commercial Managed Care - PPO | Admitting: Dietician

## 2021-05-20 ENCOUNTER — Other Ambulatory Visit: Payer: Self-pay

## 2021-05-20 VITALS — BP 121/66 | HR 88 | Temp 98.1°F | Resp 18 | Ht 62.0 in | Wt 101.2 lb

## 2021-05-20 DIAGNOSIS — Z79899 Other long term (current) drug therapy: Secondary | ICD-10-CM | POA: Insufficient documentation

## 2021-05-20 DIAGNOSIS — C3411 Malignant neoplasm of upper lobe, right bronchus or lung: Secondary | ICD-10-CM | POA: Insufficient documentation

## 2021-05-20 DIAGNOSIS — C3490 Malignant neoplasm of unspecified part of unspecified bronchus or lung: Secondary | ICD-10-CM

## 2021-05-20 DIAGNOSIS — R1310 Dysphagia, unspecified: Secondary | ICD-10-CM | POA: Insufficient documentation

## 2021-05-20 DIAGNOSIS — Z5112 Encounter for antineoplastic immunotherapy: Secondary | ICD-10-CM

## 2021-05-20 LAB — CMP (CANCER CENTER ONLY)
ALT: 11 U/L (ref 0–44)
AST: 14 U/L — ABNORMAL LOW (ref 15–41)
Albumin: 3.9 g/dL (ref 3.5–5.0)
Alkaline Phosphatase: 111 U/L (ref 38–126)
Anion gap: 7 (ref 5–15)
BUN: 7 mg/dL (ref 6–20)
CO2: 29 mmol/L (ref 22–32)
Calcium: 9.7 mg/dL (ref 8.9–10.3)
Chloride: 105 mmol/L (ref 98–111)
Creatinine: 0.78 mg/dL (ref 0.44–1.00)
GFR, Estimated: >60 mL/min (ref >60)
Glucose, Bld: 84 mg/dL (ref 70–99)
Potassium: 3.9 mmol/L (ref 3.5–5.1)
Sodium: 141 mmol/L (ref 135–145)
Total Bilirubin: 0.5 mg/dL (ref 0.3–1.2)
Total Protein: 7.3 g/dL (ref 6.5–8.1)

## 2021-05-20 LAB — CBC WITH DIFFERENTIAL (CANCER CENTER ONLY)
Abs Immature Granulocytes: 0.01 10*3/uL (ref 0.00–0.07)
Basophils Absolute: 0 10*3/uL (ref 0.0–0.1)
Basophils Relative: 1 %
Eosinophils Absolute: 0.2 10*3/uL (ref 0.0–0.5)
Eosinophils Relative: 4 %
HCT: 36.1 % (ref 36.0–46.0)
Hemoglobin: 12.4 g/dL (ref 12.0–15.0)
Immature Granulocytes: 0 %
Lymphocytes Relative: 19 %
Lymphs Abs: 0.8 10*3/uL (ref 0.7–4.0)
MCH: 33.6 pg (ref 26.0–34.0)
MCHC: 34.3 g/dL (ref 30.0–36.0)
MCV: 97.8 fL (ref 80.0–100.0)
Monocytes Absolute: 0.4 10*3/uL (ref 0.1–1.0)
Monocytes Relative: 9 %
Neutro Abs: 2.8 10*3/uL (ref 1.7–7.7)
Neutrophils Relative %: 67 %
Platelet Count: 193 10*3/uL (ref 150–400)
RBC: 3.69 MIL/uL — ABNORMAL LOW (ref 3.87–5.11)
RDW: 12.2 % (ref 11.5–15.5)
WBC Count: 4.1 10*3/uL (ref 4.0–10.5)
nRBC: 0 % (ref 0.0–0.2)

## 2021-05-20 LAB — TSH: TSH: 2.105 u[IU]/mL (ref 0.350–4.500)

## 2021-05-20 MED ORDER — SODIUM CHLORIDE 0.9 % IV SOLN: Freq: Once | INTRAVENOUS | Status: AC

## 2021-05-20 MED ORDER — SODIUM CHLORIDE 0.9 % IV SOLN
1500.0000 mg | Freq: Once | INTRAVENOUS | Status: AC
  Administered 2021-05-20: 1500 mg via INTRAVENOUS
  Filled 2021-05-20: qty 30

## 2021-05-20 NOTE — Progress Notes (Signed)
Nutrition Follow-up: ? ?Patient with NSCLC. She is currently receiving consolidation immunotherapy with Imfinzi q4w.  ? ?Met with patient during infusion. She reports appetite is good. Patient is eating 3 meals day. Patient reports improved dysphagia, she is tolerating all food textures without difficulty. Yesterday patient recalls bacon and eggs for breakfast, peanut butter mayo sandwich and chips for lunch, fried chicken, mashed pots, peas, beans for supper. Patient is drinking 3 bottles of water as well as one Ensure Complete daily. Patient would like to drink more to help her gain weight, but they are expensive. Patient denies nausea, vomiting, diarrhea, constipation.   ? ?Medications: reviewed ? ?Labs: reviewed ? ?Anthropometrics: Weight 101 lb 3.2 oz today stable ? ?4/7 - 101 lb ?3/22 - 100 lb 11.2 oz ?3/7 - 98 lb 3.2 oz   ? ?NUTRITION DIAGNOSIS: Unintentional weight loss stable ? ? ?INTERVENTION:  ?Reviewed ways to add calories/protein to foods and encouraged snacks in between meals ?One complimentary case of Ensure Plus High Protein (350 kcal, 20 grams protein) provided ?Recommend drinking 2 Ensure Plus daily to promote weight gain  ?  ? ?MONITORING, EVALUATION, GOAL: weight trends, intake ? ? ?NEXT VISIT: Wednesday June 14 during infusion  ? ? ? ?

## 2021-05-20 NOTE — Progress Notes (Signed)
Oncology Nurse Navigator Documentation ? ? ?  05/20/2021  ? 10:00 AM 03/20/2021  ?  3:00 PM 01/06/2021  ?  9:00 AM 01/02/2021  ?  2:00 PM 12/23/2020  ?  3:00 PM 12/22/2020  ?  1:00 PM 12/22/2020  ? 12:00 PM  ?Oncology Nurse Navigator Flowsheets  ?Diagnosis Status  Confirmed Diagnosis Complete    Pending Molecular Studies Confirmed Diagnosis Complete  ?Planned Course of Treatment  Chemotherapy;Radiation  Chemo/Radiation Concurrent Chemo/Radiation Concurrent    ?Phase of Treatment  Targeted Therapy Radiation Radiation Radiation    ?Chemotherapy Actual Start Date:  01/05/2021  01/05/2021     ?Chemotherapy Actual End Date:  02/24/2021       ?Radiation Actual Start Date:  01/01/2021 01/01/2021 12/29/2020     ?Radiation Actual End Date:  02/19/2021       ?Targeted Therapy Actual Start Date:  03/25/2021       ?Navigator Follow Up Date: 06/09/2021 04/02/2021 01/19/2021 01/05/2021 12/29/2020    ?Navigator Follow Up Reason: Review Note Follow-up Appointment Follow-up Appointment Chemotherapy Other:    ?Navigator Location [place]  ?Navigator Encounter Type Clinic/MDC Other: Clinic/MDC;Treatment Other: Other: Other: Diagnostic Results  ?Treatment Initiated Date   01/01/2021  12/29/2020    ?Patient Visit Type MedOnc Other MedOnc;Other Other Other    ?Treatment Phase Treatment Treatment Treatment Other Other    ?Barriers/Navigation Needs Education/I spoke to Ms. Quiros today.  She is doing well with treatment.  We talked about her concerns about going back to work.  Patient is having PFT's in a few weeks to see if she qualifies for disability. I asked her to call me if she does not qualify so I can get her set up with CSW.  She verbalized understanding.  Coordination of Care Education Coordination of Care Coordination of Care  Coordination of Care  ?Education Other  Other      ?Interventions Education;Psycho-Social Support Coordination of  Care Education;Psycho-Social Support Coordination of Care Coordination of Care Other Coordination of Care  ?Acuity Level 2-Minimal Needs (1-2 Barriers Identified) Level 2-Minimal Needs (1-2 Barriers Identified) Level 4-High Needs (Greater Than 4 Barriers Identified) Level 2-Minimal Needs (1-2 Barriers Identified) Level 2-Minimal Needs (1-2 Barriers Identified) Level 2-Minimal Needs (1-2 Barriers Identified) Level 2-Minimal Needs (1-2 Barriers Identified)  ?Coordination of Care  Other   Other Pathology Pathology  ?Education Method Verbal  Verbal      ?Time Spent with Patient 30 30 45 30 30 15 15   ?  ?

## 2021-05-20 NOTE — Progress Notes (Signed)
?    [place] ?[phone]   [phone] ? ?OFFICE PROGRESS NOTE ? ?Julie Beals, NP ?[address] ?[place] [address] ? ?DIAGNOSIS: Stage IIIB  (T1b, N3, M0) non-small cell lung cancer, adenocarcinoma she presented with right upper lobe nodule in addition to bulky right hilar, mediastinal, and left supraclavicular lymphadenopathy ? ?DETECTED ALTERATION(S) / BIOMARKER(S) % CFDNA OR AMPLIFICATION ASSOCIATED FDA-APPROVED THERAPIES CLINICAL TRIAL AVAILABILITY ?[id number] ND 0.5 5 ?50 ?100 ?4.7% ? ?RHOAG17E ?ND ?0.5 ?5 ?50 ?100 ?1.8% ? ?[id number] ?ND ?0.5 ?5 ?50 ?100 ?1.9% ? ?BIOMARKER ADDITIONAL DETAILS ?Tumor Mutational Burden (TMB) ?19.02 mut/Mb ?MSI Status ?Stable (MSS) ?PD-L1 Tumor Proportion Score (TPS)* ?<1% ?  ?PRIOR THERAPY: Concurrent chemoradiation with carboplatin for an AUC of 2 and paclitaxel 45 mg per metered squared.  First dose on 01/05/2021.  Status post 7 cycles of treatment.  Last dose was given February 16, 2021. ?  ?CURRENT THERAPY: Consolidation immunotherapy with Imfinzi 1500 Mg IV every 4 weeks.  First dose March 25, 2021.  Status post 2 cycles. ? ?INTERVAL HISTORY: ?Julie Nicholson 56 y.o. female returns to the clinic today for follow-up visit.  The patient is feeling fine today with no concerning complaints except for intermittent pain on the right side of the chest from the previous radiation.  She also has shortness of breath with exertion.  She denied having any hemoptysis.  She has occasional nausea with no vomiting, diarrhea or constipation.  She has no weight loss or night sweats.  She has no fever or chills.  She has no headache or visual changes.  The patient is here today for evaluation before starting cycle #3 of her treatment with Imfinzi. ? ?MEDICAL HISTORY: ?Past Medical History:  ?Diagnosis Date  ? Abdominal discomfort   ? Cancer [facility])   ? Chronic headaches   ? due to allergies, sinus  ? COPD (chronic obstructive pulmonary  disease) ([place])   ? per 2012 chest xray   pt states she doesn not have this now (04/10/2013)  ? Deviated nasal septum   ? Eustachian tube dysfunction   ? GERD (gastroesophageal reflux disease)   ? occasional uses Tums / Rolaids  ? Hearing loss   ? right ear  ? High cholesterol   ? History of radiation therapy   ? right lung 01/07/2021-02/19/2021  Dr Gery Pray  ? Migraine   ? "only once in a blue moon since RX'd allergy shots" (04/10/2013)  ? Pancreatitis 02/08/2013  ? Pneumonia   ? Rhinitis, allergic   ? ? ?ALLERGIES:  is allergic to benadryl [diphenhydramine]. ? ?MEDICATIONS:  ?Current Outpatient Medications  ?Medication Sig Dispense Refill  ? acetaminophen (TYLENOL) 500 MG tablet Take 1,000 mg by mouth every 6 (six) hours as needed for moderate pain.    ? albuterol (VENTOLIN HFA) 108 (90 Base) MCG/ACT inhaler Inhale 1-2 puffs into the lungs every 6 (six) hours as needed for wheezing or shortness of breath. 6.7 g 5  ? aspirin EC 81 MG tablet Take 81 mg by mouth at bedtime.    ? atorvastatin (LIPITOR) 40 MG tablet Take 40 mg by mouth at bedtime.    ? benzonatate (TESSALON) 100 MG capsule Take 1-2 capsules (100-200 mg total) by mouth 3 (three) times daily as needed for cough. 20 capsule 0  ? Biotin 5000 MCG TABS Take 5,000 mcg by mouth at bedtime.    ? buPROPion (WELLBUTRIN SR) 150 MG 12 hr tablet Take 1 tablet (150 mg total)  by mouth 2 (two) times daily. 60 tablet 5  ? Ca Carbonate-Mag Hydroxide (ROLAIDS PO) Take 1 tablet by mouth daily as needed (heartburn).    ? famotidine (PEPCID) 20 MG tablet Take 1 tablet by mouth twice daily 60 tablet 3  ? fluticasone (FLONASE) 50 MCG/ACT nasal spray Place 2 sprays into both nostrils daily as needed for allergies.    ? lidocaine (XYLOCAINE) 2 % solution Swish/gargle as directed 15 mLs in the mouth or throat every 4 (four) hours as needed for mouth pain (throat pain). 100 mL 0  ? loratadine (CLARITIN) 10 MG tablet Take 1 tablet (10 mg total) by mouth daily. 30 tablet 5  ?  Multiple Vitamins-Minerals (HAIR SKIN AND NAILS FORMULA PO) Take 1 tablet by mouth daily.    ? Oxycodone HCl 10 MG TABS Take 1 tablet by mouth 3 (three) times daily as needed (pain).    ? pantoprazole (PROTONIX) 40 MG tablet Take 1 tablet (40 mg total) by mouth daily. 30 tablet 5  ? prochlorperazine (COMPAZINE) 10 MG tablet Take 1 tablet (10 mg total) by mouth every 6 (six) hours as needed. 30 tablet 2  ? sucralfate (CARAFATE) 1 g tablet Dissolve 1 tablet in 10 mL H20 and swallow 30 min prior to meals and bedtime. 90 tablet 0  ? Tiotropium Bromide Monohydrate (SPIRIVA RESPIMAT) 1.25 MCG/ACT AERS Inhale 2 puffs into the lungs daily. 4 g 0  ? Tiotropium Bromide Monohydrate (SPIRIVA RESPIMAT) 1.25 MCG/ACT AERS Inhale 2 puffs into the lungs daily. 4 g 0  ? UBRELVY 100 MG TABS Take 100 mg by mouth daily as needed (migraine).    ? ?No current facility-administered medications for this visit.  ? ? ?SURGICAL HISTORY:  ?Past Surgical History:  ?Procedure Laterality Date  ? ABDOMINAL HYSTERECTOMY  1995  ? tx endometriosis, both ovaries removed  ? APPENDECTOMY  late 1990's  ? BIOPSY  02/26/2021  ? Procedure: BIOPSY;  Surgeon: Milus Banister, MD;  Location: [facility] ENDOSCOPY;  Service: Endoscopy;;  ? BRONCHIAL BRUSHINGS  12/15/2020  ? Procedure: BRONCHIAL BRUSHINGS;  Surgeon: Collene Gobble, MD;  Location[facility] ENDOSCOPY;  Service: Cardiopulmonary;;  ? BRONCHIAL NEEDLE ASPIRATION BIOPSY  12/15/2020  ? Procedure: BRONCHIAL NEEDLE ASPIRATION BIOPSIES;  Surgeon: Collene Gobble, MD;  Location: [facility] ENDOSCOPY;  Service: Cardiopulmonary;;  ? CHOLECYSTECTOMY  04/10/2013  ? CHOLECYSTECTOMY N/A 04/10/2013  ? Procedure: LAPAROSCOPIC CHOLECYSTECTOMY WITH INTRAOPERATIVE CHOLANGIOGRAM;  Surgeon: Odis Hollingshead, MD;  Location: [place];  Service: General;  Laterality: N/A;  ? ELECTROMAGNETIC NAVIGATION BROCHOSCOPY  12/15/2020  ? Procedure: ELECTROMAGNETIC NAVIGATION BRONCHOSCOPY;  Surgeon: Collene Gobble, MD;  Location: [facility] ENDOSCOPY;  Service:  Cardiopulmonary;;  ? ESOPHAGOGASTRODUODENOSCOPY (EGD) WITH PROPOFOL N/A 02/26/2021  ? Procedure: ESOPHAGOGASTRODUODENOSCOPY (EGD) WITH PROPOFOL;  Surgeon: Milus Banister, MD;  Location: [facility] ENDOSCOPY;  Service: Endoscopy;  Laterality: N/A;  ? EUS N/A 02/16/2013  ? Procedure: UPPER ENDOSCOPIC ULTRASOUND (EUS) LINEAR;  Surgeon: Milus Banister, MD;  Location: [facility] ENDOSCOPY;  Service: Endoscopy;  Laterality: N/A;  radial linear  ? KNEE ARTHROSCOPY Right 1980's  ? "cartilage OR"  ? LAPAROSCOPIC ENDOMETRIOSIS FULGURATION  1980's  ? MYRINGOTOMY WITH TUBE PLACEMENT Right 07/13/2018  ? Procedure: MYRINGOTOMY WITH TUBE PLACEMENT;  Surgeon: Margaretha Sheffield, MD;  Location: [place];  Service: ENT;  Laterality: Right;  ? NASOPHARYNGOSCOPY EUSTATION TUBE BALLOON DILATION Right 07/13/2018  ? Procedure: NASOPHARYNGOSCOPY EUSTATION TUBE BALLOON DILATION;  Surgeon: Margaretha Sheffield, MD;  Location: [place];  Service: ENT;  Laterality:  Right;  ? TONSILLECTOMY AND ADENOIDECTOMY  ~ 1980  ? adenoidectomy  ? TUBAL LIGATION  ~ 1987  ? TURBINATE REDUCTION Right 07/13/2018  ? Procedure: OUTFRACTURE TURBINATE;  Surgeon: Margaretha Sheffield, MD;  Location: [place];  Service: ENT;  Laterality: Right;  ? VIDEO BRONCHOSCOPY WITH ENDOBRONCHIAL ULTRASOUND N/A 12/15/2020  ? Procedure: ROBOTIC VIDEO BRONCHOSCOPY WITH ENDOBRONCHIAL ULTRASOUND;  Surgeon: Collene Gobble, MD;  Location: [place];  Service: Cardiopulmonary;  Laterality: N/A;  ? WRIST SURGERY Left   ? w/plate  ? ? ?REVIEW OF SYSTEMS:  A comprehensive review of systems was negative except for: Respiratory: positive for cough and dyspnea on exertion ?Gastrointestinal: positive for nausea  ? ?PHYSICAL EXAMINATION: General appearance: alert, cooperative, fatigued, and no distress ?Head: Normocephalic, without obvious abnormality, atraumatic ?Neck: no adenopathy, no JVD, supple, symmetrical, trachea midline, and thyroid not enlarged, symmetric, no  tenderness/mass/nodules ?Lymph nodes: Cervical, supraclavicular, and axillary nodes normal. ?Resp: clear to auscultation bilaterally ?Back: symmetric, no curvature. ROM normal. No CVA tenderness. ?Cardio: regular rate and rhythm,

## 2021-05-20 NOTE — Patient Instructions (Signed)
[facility]   Discharge Instructions: Thank you for choosing [facility] to provide your oncology and hematology care.   If you have a lab appointment with the Cancer Center, please go directly to the Cancer Center and check in at the registration area.   Wear comfortable clothing and clothing appropriate for easy access to any Portacath or PICC line.   We strive to give you quality time with your provider. You may need to reschedule your appointment if you arrive late (15 or more minutes).  Arriving late affects you and other patients whose appointments are after yours.  Also, if you miss three or more appointments without notifying the office, you may be dismissed from the clinic at the provider's discretion.      For prescription refill requests, have your pharmacy contact our office and allow 72 hours for refills to be completed.    Today you received the following chemotherapy and/or immunotherapy agents: Durvalumab (Imfinzi).      To help prevent nausea and vomiting after your treatment, we encourage you to take your nausea medication as directed.  BELOW ARE SYMPTOMS THAT SHOULD BE REPORTED IMMEDIATELY: *FEVER GREATER THAN 100.4 F (38 C) OR HIGHER *CHILLS OR SWEATING *NAUSEA AND VOMITING THAT IS NOT CONTROLLED WITH YOUR NAUSEA MEDICATION *UNUSUAL SHORTNESS OF BREATH *UNUSUAL BRUISING OR BLEEDING *URINARY PROBLEMS (pain or burning when urinating, or frequent urination) *BOWEL PROBLEMS (unusual diarrhea, constipation, pain near the anus) TENDERNESS IN MOUTH AND THROAT WITH OR WITHOUT PRESENCE OF ULCERS (sore throat, sores in mouth, or a toothache) UNUSUAL RASH, SWELLING OR PAIN  UNUSUAL VAGINAL DISCHARGE OR ITCHING   Items with * indicate a potential emergency and should be followed up as soon as possible or go to the Emergency Department if any problems should occur.  Please show the CHEMOTHERAPY ALERT CARD or IMMUNOTHERAPY ALERT CARD  at check-in to the Emergency Department and triage nurse.  Should you have questions after your visit or need to cancel or reschedule your appointment, please contact [facility]  Dept: [phone]  and follow the prompts.  Office hours are 8:00 a.m. to 4:30 p.m. Monday - Friday. Please note that voicemails left after 4:00 p.m. may not be returned until the following business day.  We are closed weekends and major holidays. You have access to a nurse at all times for urgent questions. Please call the main number to the clinic Dept: [phone] and follow the prompts.   For any non-urgent questions, you may also contact your provider using MyChart. We now offer e-Visits for anyone 18 and older to request care online for non-urgent symptoms. For details visit [email or web address].   Also download the MyChart app! Go to the app store, search "MyChart", open the app, select [facility], and log in with your MyChart username and password.  Due to Covid, a mask is required upon entering the hospital/clinic. If you do not have a mask, one will be given to you upon arrival. For doctor visits, patients may have 1 support person aged 18 or older with them. For treatment visits, patients cannot have anyone with them due to current Covid guidelines and our immunocompromised population.   

## 2021-05-21 ENCOUNTER — Telehealth (HOSPITAL_COMMUNITY): Payer: Self-pay

## 2021-05-21 NOTE — Telephone Encounter (Signed)
Called patient to see if she is interested in the Pulmonary Rehab Program. Patient expressed interest. Explained scheduling process, patient verbalized understanding.  ?

## 2021-05-25 ENCOUNTER — Telehealth: Payer: Self-pay | Admitting: Emergency Medicine

## 2021-05-25 ENCOUNTER — Encounter: Payer: Self-pay | Admitting: Pulmonary Disease

## 2021-05-25 ENCOUNTER — Ambulatory Visit (INDEPENDENT_AMBULATORY_CARE_PROVIDER_SITE_OTHER): Payer: Commercial Managed Care - PPO | Admitting: Pulmonary Disease

## 2021-05-25 VITALS — BP 112/62 | HR 113 | Temp 98.6°F | Ht 62.0 in | Wt 101.8 lb

## 2021-05-25 DIAGNOSIS — C3411 Malignant neoplasm of upper lobe, right bronchus or lung: Secondary | ICD-10-CM

## 2021-05-25 DIAGNOSIS — J432 Centrilobular emphysema: Secondary | ICD-10-CM

## 2021-05-25 MED ORDER — BENZONATATE 100 MG PO CAPS: 100.0000 mg | ORAL_CAPSULE | Freq: Three times a day (TID) | ORAL | 0 refills | Status: DC | PRN

## 2021-05-25 MED ORDER — TRELEGY ELLIPTA 200-62.5-25 MCG/ACT IN AEPB: 1.0000 | INHALATION_SPRAY | Freq: Every day | RESPIRATORY_TRACT | 2 refills | Status: DC

## 2021-05-25 MED ORDER — AZITHROMYCIN 250 MG PO TABS
ORAL_TABLET | ORAL | 0 refills | Status: DC
Start: 2021-05-25 — End: 2021-05-26

## 2021-05-25 MED ORDER — PREDNISONE 20 MG PO TABS: ORAL_TABLET | ORAL | 0 refills | Status: DC

## 2021-05-25 NOTE — Patient Instructions (Signed)
I am sorry you are not feeling well today ?We will get a CT angiogram to evaluate for pulmonary embolism ?We will give you a Z-Pak and prednisone 40 mg a day for 5 days ?Stop Spiriva and take trelegy ?Continue oxycodone for pain ?You can also take Motrin and Tylenol for pain as well ?Please use over-the-counter topical patches for pain relief ?We will prescribe Tessalon Perles for cough suppression.  Use Delsym over-the-counter for cough ? ?Follow-up as scheduled next month with Dr. Lamonte Sakai ? ? ?

## 2021-05-25 NOTE — Telephone Encounter (Signed)
Spoke with the pt  ?She is c/o CP on the right any time she takes a deep breath or coughs  ?Pain is not new but feels worse than it has  ?She feels more SOB  ?OV with Dr Vaughan Browner at noon today  ?Advised to seek emergent care sooner if needed ?

## 2021-05-25 NOTE — Progress Notes (Signed)
? ?      ?Julie Nicholson    [id number]    September 15, 1965 ? ?Primary Care Physician:Millsaps, Joelene Millin, NP ? ?Referring Physician: [name], NP ?[address],  [place] [id number] ? ?Chief complaint: Acute visit for cough, right chest pain ? ?HPI: ?56 year old with COPD, Stage IIIB  (T1b, N3, M0) non-small cell lung cancer, adenocarcinoma treated with chemoradiation which finished in January 2023.  Currently on immunotherapy with Imfinzi which is started in February 2023 ? ?Seen in pulmonary clinic by nurse practitioner on 4/7 for cough, bronchitis and given a course of prednisone.  She has not improved and notes increasing cough.  Now has worsening right chest pain which is exacerbated by cough and deep breath.  Sputum is white to cream-colored.  No wheezing no fevers or chills ? ?Outpatient Encounter Medications as of 05/25/2021  ?Medication Sig  ? acetaminophen (TYLENOL) 500 MG tablet Take 1,000 mg by mouth every 6 (six) hours as needed for moderate pain.  ? albuterol (PROVENTIL) (2.5 MG/3ML) 0.083% nebulizer solution albuterol sulfate 2.5 mg/3 mL (0.083 %) solution for nebulization ? USE 1 VIAL IN NEBULIZER EVERY 6 HOURS AS NEEDED FOR WHEEZING OR SHORTNESS OF BREATH  ? albuterol (VENTOLIN HFA) 108 (90 Base) MCG/ACT inhaler Inhale 1-2 puffs into the lungs every 6 (six) hours as needed for wheezing or shortness of breath.  ? aspirin EC 81 MG tablet Take 81 mg by mouth at bedtime.  ? atorvastatin (LIPITOR) 40 MG tablet Take 40 mg by mouth at bedtime.  ? benzonatate (TESSALON) 100 MG capsule Take 1-2 capsules (100-200 mg total) by mouth 3 (three) times daily as needed for cough.  ? Biotin 5000 MCG TABS Take 5,000 mcg by mouth at bedtime.  ? buPROPion (WELLBUTRIN SR) 150 MG 12 hr tablet Take 1 tablet (150 mg total) by mouth 2 (two) times daily.  ? Ca Carbonate-Mag Hydroxide (ROLAIDS PO) Take 1 tablet by mouth daily as needed (heartburn).  ? famotidine (PEPCID) 20 MG tablet Take 1 tablet by mouth  twice daily  ? fluticasone (FLONASE) 50 MCG/ACT nasal spray Place 2 sprays into both nostrils daily as needed for allergies.  ? lidocaine (XYLOCAINE) 2 % solution Swish/gargle as directed 15 mLs in the mouth or throat every 4 (four) hours as needed for mouth pain (throat pain).  ? loratadine (CLARITIN) 10 MG tablet Take 1 tablet (10 mg total) by mouth daily.  ? Multiple Vitamins-Minerals (HAIR SKIN AND NAILS FORMULA PO) Take 1 tablet by mouth daily.  ? ondansetron (ZOFRAN-ODT) 8 MG disintegrating tablet Take by mouth.  ? Oxycodone HCl 10 MG TABS Take 1 tablet by mouth 3 (three) times daily as needed (pain).  ? prochlorperazine (COMPAZINE) 10 MG tablet Take 1 tablet (10 mg total) by mouth every 6 (six) hours as needed.  ? sucralfate (CARAFATE) 1 g tablet Dissolve 1 tablet in 10 mL H20 and swallow 30 min prior to meals and bedtime.  ? Tiotropium Bromide Monohydrate (SPIRIVA RESPIMAT) 1.25 MCG/ACT AERS Inhale 2 puffs into the lungs daily.  ? UBRELVY 100 MG TABS Take 100 mg by mouth daily as needed (migraine).  ? [DISCONTINUED] predniSONE (DELTASONE) 20 MG tablet prednisone 20 mg tablet ? TAKE 2 TABLETS BY MOUTH ONCE DAILY WITH BREAKFAST FOR 5 DAYS  ? [DISCONTINUED] Tiotropium Bromide Monohydrate (SPIRIVA RESPIMAT) 1.25 MCG/ACT AERS Inhale 2 puffs into the lungs daily.  ? pantoprazole (PROTONIX) 40 MG tablet Take 1 tablet (40 mg total) by mouth daily.  ? ?No facility-administered  encounter medications on file as of 05/25/2021.  ? ?Physical Exam: ?Blood pressure 112/62, pulse (!) 113, temperature 98.6 ?F (37 ?C), temperature source Oral, height 5\' 2"  (1.575 m), weight 101 lb 12.8 oz (46.2 kg), SpO2 96 %. ?Gen:      No acute distress ?HEENT:  EOMI, sclera anicteric ?Neck:     No masses; no thyromegaly ?Lungs:    Clear to auscultation bilaterally; normal respiratory effort ?CV:         Regular rate and rhythm; no murmurs ?Abd:      + bowel sounds; soft, non-tender; no palpable masses, no distension ?Ext:    No edema;  adequate peripheral perfusion ?Skin:      Warm and dry; no rash ?Neuro: alert and oriented x 3 ?Psych: normal mood and affect ? ?Data Reviewed: ?Imaging: ?CT chest 03/13/2021-spiculated right upper lobe nodule, normal mental status with improvement compared to prior.  I have reviewed the images personally. ? ? ?Assessment:  ?Acute cough, bronchitis ?Pleuritic chest pain ? ?We will treat for acute bronchitis, COPD exacerbation with Z-Pak, prednisone ?She is already taking oxycodone for pain and Tessalon for cough.  Advised addition of Delsym over-the-counter ?She can take Tylenol or Motrin as well.  Use topical patches for pain relief ?Stop Spiriva and start Trelegy inhaler ? ?I am also concerned about possibility of pulmonary embolism given ongoing issues with malignancy.  Will obtain CT angiogram for better evaluation.  She is due for follow-up scan by Dr. Julien Nordmann around this time anyway. ? ?Plan/Recommendations: ?Z-Pak, prednisone, oxycodone for pain, Tessalon ?Topical pain patches ?Stop Spiriva, start Trelegy ?CT angiogram ? ?Marshell Garfinkel MD ?[facility] ?05/25/2021, 12:21 PM ? ?CC: [name], NP ? ?  ?

## 2021-05-26 ENCOUNTER — Encounter: Payer: Self-pay | Admitting: Otolaryngology

## 2021-05-26 ENCOUNTER — Encounter (HOSPITAL_COMMUNITY): Payer: Self-pay | Admitting: *Deleted

## 2021-05-26 NOTE — Progress Notes (Signed)
Received referral from Dr. Lamonte Sakai for this pt to participate in pulmonary rehab with the diagnosis of Centrilobular Emphysema. Clinical review of pt follow up appt on 4/25 with Dr. Vaughan Browner as a sick visit Pulmonary office note.  Pt with Covid Risk Score - 2. Pt appropriate for scheduling for Pulmonary rehab. Will follow results of PFT scheduled for 5/25 to determine if COPD stageable.  Will forward to support staff for scheduling and verification of insurance eligibility/benefits with pt consent. Cherre Huger, BSN ?Cardiac and Pulmonary Rehab Nurse Navigator  ? ?

## 2021-05-27 ENCOUNTER — Encounter (HOSPITAL_COMMUNITY): Payer: Self-pay

## 2021-05-27 ENCOUNTER — Ambulatory Visit (HOSPITAL_COMMUNITY)
Admission: RE | Admit: 2021-05-27 | Discharge: 2021-05-27 | Disposition: A | Discharge status: Home / Self Care | Payer: Commercial Managed Care - PPO | Source: Ambulatory Visit | Attending: Pulmonary Disease | Admitting: Pulmonary Disease

## 2021-05-27 DIAGNOSIS — C3411 Malignant neoplasm of upper lobe, right bronchus or lung: Secondary | ICD-10-CM | POA: Insufficient documentation

## 2021-05-27 MED ORDER — SODIUM CHLORIDE (PF) 0.9 % IJ SOLN
INTRAMUSCULAR | Status: AC
  Filled 2021-05-27: qty 50

## 2021-05-27 MED ORDER — IOHEXOL 350 MG/ML SOLN
100.0000 mL | Freq: Once | INTRAVENOUS | Status: AC | PRN
  Administered 2021-05-27: 75 mL via INTRAVENOUS

## 2021-05-28 ENCOUNTER — Telehealth: Payer: Self-pay | Admitting: Pulmonary Disease

## 2021-05-28 ENCOUNTER — Ambulatory Visit (HOSPITAL_COMMUNITY): Payer: Commercial Managed Care - PPO

## 2021-05-28 NOTE — Discharge Instructions (Signed)
[facility] ?DISCHARGE INSTRUCTIONS FOR MYRINGOTOMY AND TUBE INSERTION ? ?[facility] ?Margaretha Sheffield, M.D. ? ?Diet:   After surgery, the patient should take only liquids and foods as tolerated.  The patient may then have a regular diet after the effects of anesthesia have worn off, usually about four to six hours after surgery. ? ?Activities:   The patient should rest until the effects of anesthesia have worn off.  After this, there are no restrictions on the normal daily activities. ? ?Medications:   You will be given a prescription for antibiotic drops to be used in the ears postoperatively.  It is recommended to use 3 drops 3 times a day for 3 days, then the drops should be saved for possible future use. ? ?The tubes should not cause any discomfort to the patient, but if there is any question, Tylenol should be given according to the instructions for the age of the patient. ? ?Other medications should be continued normally. ? ?Precautions:   Should there be recurrent drainage after the tubes are placed, the drops should be used for approximately 3-4 days.  If it does not clear, you should call the ENT office. ? ?Earplugs:   Earplugs are only needed for those who are going to be submerged under water.  When taking a bath or shower and using a cup or showerhead to rinse hair, it is not necessary to wear earplugs.  These come in a variety of fashions, all of which can be obtained at our office.  However, if one is not able to come by the office, then silicone plugs can be found at most pharmacies.  It is not advised to stick anything in the ear that is not approved as an earplug.  Silly putty is not to be used as an earplug.  Swimming is allowed in patients after ear tubes are inserted, however, they must wear earplugs if they are going to be submerged under water.  For those children who are going to be swimming a lot, it is recommended to use a fitted ear mold, which can be made by  our audiologist.  If discharge is noticed from the ears, this most likely represents an ear infection.  We would recommend getting your eardrops and using them as indicated above.  If it does not clear, then you should call the ENT office.  For follow up, the patient should return to the ENT office three weeks postoperatively and then every six months as required by the doctor.  ?

## 2021-05-29 NOTE — Telephone Encounter (Signed)
Called and spoke with patient who is calling about her CT scan that was done on 05/27/2021. ? ?Dr. Vaughan Browner please advise ?

## 2021-06-01 NOTE — Telephone Encounter (Signed)
I called and discussed CT scan results with patient.  There is no pulm embolism.  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  She already got treated with a Z-Pak and prednisone last week and is starting to improve ? ?Continue monitoring.  She will call back if symptoms worsen and has a follow-up in clinic later this month. ?

## 2021-06-04 ENCOUNTER — Ambulatory Visit
Admission: RE | Admit: 2021-06-04 | Discharge: 2021-06-04 | Disposition: A | Discharge status: Home / Self Care | Payer: Commercial Managed Care - PPO | Attending: Otolaryngology | Admitting: Otolaryngology

## 2021-06-04 ENCOUNTER — Other Ambulatory Visit: Payer: Self-pay

## 2021-06-04 ENCOUNTER — Ambulatory Visit: Payer: Commercial Managed Care - PPO | Admitting: Anesthesiology

## 2021-06-04 ENCOUNTER — Encounter
Admission: RE | Disposition: A | Discharge status: Home / Self Care | Payer: Self-pay | Source: Home / Self Care | Attending: Otolaryngology

## 2021-06-04 ENCOUNTER — Encounter: Payer: Self-pay | Admitting: Otolaryngology

## 2021-06-04 DIAGNOSIS — J449 Chronic obstructive pulmonary disease, unspecified: Secondary | ICD-10-CM | POA: Diagnosis not present

## 2021-06-04 DIAGNOSIS — H6521 Chronic serous otitis media, right ear: Secondary | ICD-10-CM | POA: Diagnosis not present

## 2021-06-04 DIAGNOSIS — H6991 Unspecified Eustachian tube disorder, right ear: Secondary | ICD-10-CM | POA: Diagnosis not present

## 2021-06-04 DIAGNOSIS — K219 Gastro-esophageal reflux disease without esophagitis: Secondary | ICD-10-CM | POA: Diagnosis not present

## 2021-06-04 DIAGNOSIS — Z79899 Other long term (current) drug therapy: Secondary | ICD-10-CM | POA: Insufficient documentation

## 2021-06-04 HISTORY — PX: MYRINGOTOMY WITH TUBE PLACEMENT: SHX5663

## 2021-06-04 SURGERY — MYRINGOTOMY WITH TUBE PLACEMENT
Anesthesia: General | Laterality: Right

## 2021-06-04 SURGERY — MYRINGOTOMY WITH TUBE PLACEMENT
Anesthesia: General | Site: Ear | Laterality: Right

## 2021-06-04 MED ORDER — MIDAZOLAM HCL 5 MG/5ML IJ SOLN
INTRAMUSCULAR | Status: DC | PRN
  Administered 2021-06-04: 2 mg via INTRAVENOUS

## 2021-06-04 MED ORDER — ACETAMINOPHEN 160 MG/5ML PO SOLN: 325.0000 mg | Freq: Once | ORAL | Status: DC

## 2021-06-04 MED ORDER — LIDOCAINE HCL (CARDIAC) PF 100 MG/5ML IV SOSY
PREFILLED_SYRINGE | INTRAVENOUS | Status: DC | PRN
  Administered 2021-06-04: 50 mg via INTRAVENOUS

## 2021-06-04 MED ORDER — ONDANSETRON HCL 4 MG/2ML IJ SOLN
INTRAMUSCULAR | Status: DC | PRN
  Administered 2021-06-04: 4 mg via INTRAVENOUS

## 2021-06-04 MED ORDER — OXYCODONE HCL 5 MG PO TABS: 5.0000 mg | ORAL_TABLET | Freq: Once | ORAL | Status: DC | PRN

## 2021-06-04 MED ORDER — LACTATED RINGERS IV SOLN: INTRAVENOUS | Status: DC

## 2021-06-04 MED ORDER — ACETAMINOPHEN 325 MG PO TABS: 325.0000 mg | ORAL_TABLET | Freq: Once | ORAL | Status: DC

## 2021-06-04 MED ORDER — OXYCODONE HCL 5 MG/5ML PO SOLN: 5.0000 mg | Freq: Once | ORAL | Status: DC | PRN

## 2021-06-04 MED ORDER — PROPOFOL 10 MG/ML IV BOLUS
INTRAVENOUS | Status: DC | PRN
  Administered 2021-06-04: 80 mg via INTRAVENOUS
  Administered 2021-06-04: 30 mg via INTRAVENOUS

## 2021-06-04 MED ORDER — CIPROFLOXACIN-DEXAMETHASONE 0.3-0.1 % OT SUSP
OTIC | Status: DC | PRN
Start: 2021-06-04 — End: 2021-06-04
  Administered 2021-06-04: 1 [drp] via OTIC

## 2021-06-04 SURGICAL SUPPLY — 11 items
BALL CTTN LRG ABS STRL LF (GAUZE/BANDAGES/DRESSINGS) ×1
BLADE MYR LANCE NRW W/HDL (BLADE) ×1 IMPLANT
BLADE MYRINGOTOMY 6 SPEAR HDL (BLADE) ×3 IMPLANT
CANISTER SUCT 1200ML W/VALVE (MISCELLANEOUS) ×3 IMPLANT
COTTONBALL LRG STERILE PKG (GAUZE/BANDAGES/DRESSINGS) ×3 IMPLANT
GLOVE SURG GAMMEX PI TX LF 7.5 (GLOVE) ×3 IMPLANT
STRAP BODY AND KNEE 60X3 (MISCELLANEOUS) ×3 IMPLANT
TOWEL OR 17X26 4PK STRL BLUE (TOWEL DISPOSABLE) ×3 IMPLANT
TUBE EAR T 1.27X5.3 BFLY (OTOLOGIC RELATED) ×1 IMPLANT
TUBING CONN 6MMX3.1M (TUBING) ×2
TUBING SUCTION CONN 0.25 STRL (TUBING) ×2 IMPLANT

## 2021-06-04 NOTE — Anesthesia Procedure Notes (Signed)
Procedure Name: General with mask airway ?Date/Time: 06/04/2021 8:39 AM ?Performed by: Mayme Genta, CRNA ?Pre-anesthesia Checklist: Patient identified, Patient being monitored, Emergency Drugs available, Timeout performed and Suction available ?Patient Re-evaluated:Patient Re-evaluated prior to induction ?Oxygen Delivery Method: Circle system utilized ?Preoxygenation: Pre-oxygenation with 100% oxygen ?Induction Type: Combination inhalational/ intravenous induction ?Ventilation: Mask ventilation without difficulty ?Dental Injury: Teeth and Oropharynx as per pre-operative assessment  ? ? ? ? ?

## 2021-06-04 NOTE — Transfer of Care (Signed)
Immediate Anesthesia Transfer of Care Note ? ?Patient: Julie Nicholson ? ?Procedure(s) Performed: MYRINGOTOMY WITH BUTTERFLY TUBE PLACEMENT (Right: Ear) ? ?Patient Location: PACU ? ?Anesthesia Type: General ? ?Level of Consciousness: awake, alert  and patient cooperative ? ?Airway and Oxygen Therapy: Patient Spontanous Breathing and Patient connected to supplemental oxygen ? ?Post-op Assessment: Post-op Vital signs reviewed, Patient's Cardiovascular Status Stable, Respiratory Function Stable, Patent Airway and No signs of Nausea or vomiting ? ?Post-op Vital Signs: Reviewed and stable ? ?Complications: No notable events documented. ? ?

## 2021-06-04 NOTE — Anesthesia Preprocedure Evaluation (Signed)
Anesthesia Evaluation  ?Patient identified by MRN, date of birth, ID band ?Patient awake ? ? ? ?Reviewed: ?Allergy & Precautions, H&P , NPO status , Patient's Chart, lab work & pertinent test results ? ?Airway ?Mallampati: II ? ?TM Distance: <3 FB ?Neck ROM: full ? ? ? Dental ?no notable dental hx. ? ?  ?Pulmonary ?shortness of breath, COPD,  COPD inhaler, former smoker,  ?Lung CA ?  ?Pulmonary exam normal ?breath sounds clear to auscultation ? ? ? ? ? ? Cardiovascular ?Normal cardiovascular exam ?Rhythm:regular Rate:Normal ? ? ?  ?Neuro/Psych ?  ? GI/Hepatic ?GERD  ,  ?Endo/Other  ? ? Renal/GU ?  ? ?  ?Musculoskeletal ? ? Abdominal ?  ?Peds ? Hematology ?  ?Anesthesia Other Findings ? ? Reproductive/Obstetrics ? ?  ? ? ? ? ? ? ? ? ? ? ? ? ? ?  ?  ? ? ? ? ? ? ? ? ?Anesthesia Physical ?Anesthesia Plan ? ?ASA: 3 ? ?Anesthesia Plan: General  ? ?Post-op Pain Management: Minimal or no pain anticipated  ? ?Induction:  ? ?PONV Risk Score and Plan: 3 and Treatment may vary due to age or medical condition, Propofol infusion, TIVA and Ondansetron ? ?Airway Management Planned:  ? ?Additional Equipment:  ? ?Intra-op Plan:  ? ?Post-operative Plan:  ? ?Informed Consent: I have reviewed the patients History and Physical, chart, labs and discussed the procedure including the risks, benefits and alternatives for the proposed anesthesia with the patient or authorized representative who has indicated his/her understanding and acceptance.  ? ? ? ?Dental Advisory Given ? ?Plan Discussed with: CRNA ? ?Anesthesia Plan Comments:   ? ? ? ? ? ? ?Anesthesia Quick Evaluation ? ?

## 2021-06-04 NOTE — Op Note (Signed)
06/04/2021 ? ?8:53 AM ? ? ? ?Lianny, Molter ? ?[id number] ? ? ?Pre-Op Dx: Right eustachian tube dysfunction with chronic serous otitis media ? ?Post-op Dx: Same ? ?Proc: Right myringotomy with butterfly tube placement ? ?Surg: Huey Romans ? ?Anes:  General by mask ? ?EBL:  None ? ?Comp: None ? ?Findings: There is lots of wax superiorly around previous tube that was clogged and sitting in the anterior superior quadrant.  A butterfly tube was placed ? ?Procedure: With the patient in a comfortable supine position, general mask anesthesia was administered.  At an appropriate level, microscope and speculum were used to examine and clean the RIGHT ear canal.  The findings were as described above.  An anterior superior radial myringotomy incision was sharply executed to widen the small hole left where the old tube was still attached.  Middle ear contents were suctioned clear.  A butterfly tube was placed without difficulty.  Ciprodex otic solution was instilled into the external canal, and insufflated into the middle ear.  A cotton ball was placed at the external meatus. Hemostasis was observed.  This side was completed.  ? ?Following this the patient tolerated the procedure well.  The patient was returned to anesthesia, awakened, and transferred to recovery in stable condition.  There were no operative complications ? ?Dispo:  PACU to home ? ?Plan: Routine drop use and water precautions.  Recheck my office in 2 or three weeks with audiogram. ? ? ?Elon Alas Viney Acocella ?8:53 AM ?06/04/2021  ?

## 2021-06-04 NOTE — Anesthesia Postprocedure Evaluation (Signed)
Anesthesia Post Note ? ?Patient: Julie Nicholson ? ?Procedure(s) Performed: MYRINGOTOMY WITH BUTTERFLY TUBE PLACEMENT (Right: Ear) ? ? ?  ?Patient location during evaluation: PACU ?Anesthesia Type: General ?Level of consciousness: awake and alert and oriented ?Pain management: satisfactory to patient ?Vital Signs Assessment: post-procedure vital signs reviewed and stable ?Respiratory status: spontaneous breathing, nonlabored ventilation and respiratory function stable ?Cardiovascular status: blood pressure returned to baseline and stable ?Postop Assessment: Adequate PO intake and No signs of nausea or vomiting ?Anesthetic complications: no ? ? ?No notable events documented. ? ?Raliegh Ip ? ? ? ? ? ?

## 2021-06-04 NOTE — H&P (Signed)
H&P has been reviewed and patient reevaluated, no changes necessary. To be downloaded later.  

## 2021-06-15 NOTE — Progress Notes (Signed)
[place] ?OFFICE PROGRESS NOTE ? ?[name], NP ?[address] ?[place] [address] ? ?DIAGNOSIS: Stage IIIB  (T1b, N3, M0) non-small cell lung cancer, adenocarcinoma she presented with right upper lobe nodule in addition to bulky right hilar, mediastinal, and left supraclavicular lymphadenopathy ?  ?DETECTED ALTERATION(S) / BIOMARKER(S)     % CFDNA OR AMPLIFICATION       ASSOCIATED FDA-APPROVED THERAPIES        CLINICAL TRIAL AVAILABILITY ?[id number] ND 0.5 5 ?50 ?100 ?4.7% ?  ?RHOAG17E ?ND ?0.5 ?5 ?50 ?100 ?1.8% ?  ?[id number] ?ND ?0.5 ?5 ?50 ?100 ?1.9% ?  ?BIOMARKER  ADDITIONAL DETAILS ?Tumor Mutational Burden (TMB) ?19.02 mut/Mb ?MSI Status ?Stable (MSS) ?PD-L1 Tumor Proportion Score (TPS)* ?<1% ?  ? ?PRIOR THERAPY:   ?1) Concurrent chemoradiation with carboplatin for an AUC of 2 and paclitaxel 45 mg per metered squared.  First dose on 01/05/2021.  Status post 7 cycles of treatment.  Last dose was given February 16, 2021. ?2) Consolidation immunotherapy with Imfinzi 1500 Mg IV every 4 weeks.  First dose March 25, 2021.  Status post 3 cycles. Discontinued due to concerns for pneumonitis ? ? ?CURRENT THERAPY:  Observation  ? ?INTERVAL HISTORY: ?Julie Nicholson 56 y.o. female returns to the clinic today for a follow-up visit.  The patient is feeling fair today.  In the interval since last being seen, the patient had developed an acute cough and right rib/chest discomfort associated with coughing.  She follow-up with pulmonology.  She most recently saw pulmonology on 05/25/2021.  She was given Tessalon, Z-Pak, Delsym, Trelegy, and prednisone.  There was concern for possible PE, therefore, a CT angiogram was obtained.  This was negative for PE but showed right upper lobe consolidation which is indicative of either radiation pneumonitis but difficult to exclude pneumonia. Given that her symptoms were starting to improve, they opted to monitor for worsening symptoms and to call  pulmonology back if persistent symptoms. Of note, she is schedule to see pulmonology next week on 06/25/21. She said they are going to discuss pulmonary function testing. The patient notes that she is having a hard time performing her work related activities because of her shortness of breath with activity since completing concurrent chemo/radiation. She notes that her job can be physically demanding with lifting/turning patient's. She works as a Chartered certified accountant in the ICU.  ? ?Otherwise the patient denies any concerning complaints today.  She notes that her cough is better than it was last month but overall new compared to a few months prior.  She uses Delsym if needed.  She takes Tessalon 1-2 times per day.  As previously mentioned she has dyspnea on exertion since completing radiation.  She has some associated right rib discomfort with coughing.  Denies any hemoptysis.  Denies any fever, chills, or weight loss.  She reports she has had night sweats almost every night since midway through her concurrent chemoradiation treatment.  Denies any nausea, vomiting, diarrhea, or constipation.  Denies any headache or visual changes.  Denies any rashes or skin changes.  She also mentioned 1 time last week when she was eating she felt like food was getting stuck in her throat.  This has not happened since.  She is here today for evaluation and repeat blood work before starting cycle #4. ? ? ?MEDICAL HISTORY: ?Past Medical History:  ?Diagnosis Date  ? Abdominal discomfort   ? Cancer [facility])   ? Chronic headaches   ? due  to allergies, sinus  ? COPD (chronic obstructive pulmonary disease) ([place])   ? per 2012 chest xray   pt states she doesn not have this now (04/10/2013)  ? Deviated nasal septum   ? Eustachian tube dysfunction   ? GERD (gastroesophageal reflux disease)   ? occasional uses Tums / Rolaids  ? Hearing loss   ? right ear  ? High cholesterol   ? History of radiation therapy   ? right lung 01/07/2021-02/19/2021  Dr Gery Pray   ? Migraine   ? "only once in a blue moon since RX'd allergy shots" (04/10/2013)  ? Pancreatitis 02/08/2013  ? Pneumonia   ? Rhinitis, allergic   ? ? ?ALLERGIES:  is allergic to benadryl [diphenhydramine]. ? ?MEDICATIONS:  ?Current Outpatient Medications  ?Medication Sig Dispense Refill  ? predniSONE (DELTASONE) 10 MG tablet Please take 5 tablets (50 mg) every morning for 1 week, followed by 4 tablets (40 mg) daily for 1 week, followed by 3 tablets (30 mg) daily for 1 week, followed by 2 tablets (20 mg) daily for 1 week, followed by 1 tablet (10 mg) daily for 1 week 105 tablet 0  ? acetaminophen (TYLENOL) 500 MG tablet Take 1,000 mg by mouth every 6 (six) hours as needed for moderate pain.    ? albuterol (PROVENTIL) (2.5 MG/3ML) 0.083% nebulizer solution albuterol sulfate 2.5 mg/3 mL (0.083 %) solution for nebulization ? USE 1 VIAL IN NEBULIZER EVERY 6 HOURS AS NEEDED FOR WHEEZING OR SHORTNESS OF BREATH    ? albuterol (VENTOLIN HFA) 108 (90 Base) MCG/ACT inhaler Inhale 1-2 puffs into the lungs every 6 (six) hours as needed for wheezing or shortness of breath. 6.7 g 5  ? aspirin EC 81 MG tablet Take 81 mg by mouth at bedtime.    ? atorvastatin (LIPITOR) 40 MG tablet Take 40 mg by mouth at bedtime.    ? benzonatate (TESSALON) 100 MG capsule Take 1-2 capsules (100-200 mg total) by mouth 3 (three) times daily as needed for cough. 30 capsule 0  ? Biotin 5000 MCG TABS Take 5,000 mcg by mouth at bedtime.    ? buPROPion (WELLBUTRIN SR) 150 MG 12 hr tablet Take 1 tablet (150 mg total) by mouth 2 (two) times daily. 60 tablet 5  ? Ca Carbonate-Mag Hydroxide (ROLAIDS PO) Take 1 tablet by mouth daily as needed (heartburn).    ? famotidine (PEPCID) 20 MG tablet Take 1 tablet by mouth twice daily 60 tablet 3  ? fluticasone (FLONASE) 50 MCG/ACT nasal spray Place 2 sprays into both nostrils daily as needed for allergies.    ? Fluticasone-Umeclidin-Vilant (TRELEGY ELLIPTA) 200-62.5-25 MCG/ACT AEPB Inhale 1 puff into the lungs daily.  60 each 2  ? lidocaine (XYLOCAINE) 2 % solution Swish/gargle as directed 15 mLs in the mouth or throat every 4 (four) hours as needed for mouth pain (throat pain). 100 mL 0  ? loratadine (CLARITIN) 10 MG tablet Take 1 tablet (10 mg total) by mouth daily. 30 tablet 5  ? Multiple Vitamins-Minerals (HAIR SKIN AND NAILS FORMULA PO) Take 1 tablet by mouth daily.    ? ondansetron (ZOFRAN-ODT) 8 MG disintegrating tablet Take by mouth.    ? Oxycodone HCl 10 MG TABS Take 1 tablet by mouth 3 (three) times daily as needed (pain).    ? pantoprazole (PROTONIX) 40 MG tablet Take 1 tablet (40 mg total) by mouth daily. 30 tablet 5  ? prochlorperazine (COMPAZINE) 10 MG tablet Take 1 tablet (10 mg total) by mouth every  6 (six) hours as needed. 30 tablet 2  ? sucralfate (CARAFATE) 1 g tablet Dissolve 1 tablet in 10 mL H20 and swallow 30 min prior to meals and bedtime. 90 tablet 0  ? UBRELVY 100 MG TABS Take 100 mg by mouth daily as needed (migraine).    ? ?No current facility-administered medications for this visit.  ? ? ?SURGICAL HISTORY:  ?Past Surgical History:  ?Procedure Laterality Date  ? ABDOMINAL HYSTERECTOMY  1995  ? tx endometriosis, both ovaries removed  ? APPENDECTOMY  late 1990's  ? BIOPSY  02/26/2021  ? Procedure: BIOPSY;  Surgeon: Milus Banister, MD;  Location: [facility];  Service: Endoscopy;;  ? BRONCHIAL BRUSHINGS  12/15/2020  ? Procedure: BRONCHIAL BRUSHINGS;  Surgeon: Collene Gobble, MD;  Location: [facility] ENDOSCOPY;  Service: Cardiopulmonary;;  ? BRONCHIAL NEEDLE ASPIRATION BIOPSY  12/15/2020  ? Procedure: BRONCHIAL NEEDLE ASPIRATION BIOPSIES;  Surgeon: Collene Gobble, MD;  Location: [facility] ENDOSCOPY;  Service: Cardiopulmonary;;  ? CHOLECYSTECTOMY  04/10/2013  ? CHOLECYSTECTOMY N/A 04/10/2013  ? Procedure: LAPAROSCOPIC CHOLECYSTECTOMY WITH INTRAOPERATIVE CHOLANGIOGRAM;  Surgeon: Odis Hollingshead, MD;  Location: [facility];  Service: General;  Laterality: N/A;  ? ELECTROMAGNETIC NAVIGATION BROCHOSCOPY  12/15/2020  ?  Procedure: ELECTROMAGNETIC NAVIGATION BRONCHOSCOPY;  Surgeon: Collene Gobble, MD;  Location: [facility] ENDOSCOPY;  Service: Cardiopulmonary;;  ? ESOPHAGOGASTRODUODENOSCOPY (EGD) WITH PROPOFOL N/A 02/26/2021  ? Procedure: E

## 2021-06-17 ENCOUNTER — Inpatient Hospital Stay (HOSPITAL_BASED_OUTPATIENT_CLINIC_OR_DEPARTMENT_OTHER): Payer: Commercial Managed Care - PPO | Admitting: Physician Assistant

## 2021-06-17 ENCOUNTER — Other Ambulatory Visit: Payer: Self-pay

## 2021-06-17 ENCOUNTER — Inpatient Hospital Stay: Payer: Commercial Managed Care - PPO | Attending: Internal Medicine

## 2021-06-17 ENCOUNTER — Inpatient Hospital Stay: Payer: Commercial Managed Care - PPO

## 2021-06-17 VITALS — BP 117/66 | HR 96 | Temp 98.1°F | Resp 16 | Ht 61.0 in | Wt 104.4 lb

## 2021-06-17 DIAGNOSIS — Z79899 Other long term (current) drug therapy: Secondary | ICD-10-CM | POA: Insufficient documentation

## 2021-06-17 DIAGNOSIS — C3411 Malignant neoplasm of upper lobe, right bronchus or lung: Secondary | ICD-10-CM

## 2021-06-17 DIAGNOSIS — J189 Pneumonia, unspecified organism: Secondary | ICD-10-CM

## 2021-06-17 DIAGNOSIS — J984 Other disorders of lung: Secondary | ICD-10-CM

## 2021-06-17 DIAGNOSIS — R1310 Dysphagia, unspecified: Secondary | ICD-10-CM | POA: Diagnosis not present

## 2021-06-17 LAB — CMP (CANCER CENTER ONLY)
ALT: 18 U/L (ref 0–44)
AST: 17 U/L (ref 15–41)
Albumin: 3.8 g/dL (ref 3.5–5.0)
Alkaline Phosphatase: 161 U/L — ABNORMAL HIGH (ref 38–126)
Anion gap: 10 (ref 5–15)
BUN: 10 mg/dL (ref 6–20)
CO2: 26 mmol/L (ref 22–32)
Calcium: 9.5 mg/dL (ref 8.9–10.3)
Chloride: 106 mmol/L (ref 98–111)
Creatinine: 0.81 mg/dL (ref 0.44–1.00)
GFR, Estimated: >60 mL/min (ref >60)
Glucose, Bld: 110 mg/dL — ABNORMAL HIGH (ref 70–99)
Potassium: 3.6 mmol/L (ref 3.5–5.1)
Sodium: 142 mmol/L (ref 135–145)
Total Bilirubin: 0.5 mg/dL (ref 0.3–1.2)
Total Protein: 7.4 g/dL (ref 6.5–8.1)

## 2021-06-17 LAB — CBC WITH DIFFERENTIAL (CANCER CENTER ONLY)
Abs Immature Granulocytes: 0.01 10*3/uL (ref 0.00–0.07)
Basophils Absolute: 0 10*3/uL (ref 0.0–0.1)
Basophils Relative: 0 %
Eosinophils Absolute: 0.1 10*3/uL (ref 0.0–0.5)
Eosinophils Relative: 2 %
HCT: 36.6 % (ref 36.0–46.0)
Hemoglobin: 12.1 g/dL (ref 12.0–15.0)
Immature Granulocytes: 0 %
Lymphocytes Relative: 19 %
Lymphs Abs: 0.8 10*3/uL (ref 0.7–4.0)
MCH: 32.1 pg (ref 26.0–34.0)
MCHC: 33.1 g/dL (ref 30.0–36.0)
MCV: 97.1 fL (ref 80.0–100.0)
Monocytes Absolute: 0.4 10*3/uL (ref 0.1–1.0)
Monocytes Relative: 8 %
Neutro Abs: 3 10*3/uL (ref 1.7–7.7)
Neutrophils Relative %: 71 %
Platelet Count: 273 10*3/uL (ref 150–400)
RBC: 3.77 MIL/uL — ABNORMAL LOW (ref 3.87–5.11)
RDW: 12.7 % (ref 11.5–15.5)
WBC Count: 4.3 10*3/uL (ref 4.0–10.5)
nRBC: 0 % (ref 0.0–0.2)

## 2021-06-17 LAB — TSH: TSH: 1.233 u[IU]/mL (ref 0.350–4.500)

## 2021-06-17 MED ORDER — PREDNISONE 10 MG PO TABS: ORAL_TABLET | ORAL | 0 refills | Status: DC

## 2021-06-17 NOTE — Patient Instructions (Signed)
-  It was good seeing you today.  I know we discussed a lot of important information at your appointment today.  Here is a summary of the main discussion points from your appointment today: ?1) from a cancer standpoint, there was no evidence of any progressive/new cancer growth.  However, your scan did note a lot of scarring and inflammation in the right lung.  While this could be secondary to radiation, the immunotherapy that you are receiving can also exacerbate inflammation of the lung.  With that said, Dr. Julien Nordmann thinks it would be best to stop your treatment with immunotherapy in the event this is contributing to, or responsible in part, the new inflammation we are seeing in your lung (which was not present on your last scan-refer to the pictures printed for you at your appointment today). ?2) to counteract immunotherapy, we are sending you a prescription for prednisone.  Please take 5 tablets first thing in the morning for 1 week.  On week 2 please take 4 tablets first thing in the morning for 1 week.  On week 3 please take 3 tablets in the morning for 1 week.  On week 4 please take 2 tablets in the morning for 1 week.  On week 5 please take 1 tablet in the morning for 1 week.  After you complete 1 week of taking 1 tablet you can stop.  ?3) steroids have side effects.  You may find that you have more energy from the prednisone.  However you may also note that you have more insomnia.  Because of that this, that is why I would want you to take the prednisone first thing in the morning.  It also may cause flushing.  It may also cause swelling in the lower extremities.  It also may increase your appetite.  If you had diabetes, I would want you to monitor your blood sugar closely. ?-These keep your appointments next week with pulmonology as scheduled.  I would encourage you to talk to your pulmonologist about pulmonary rehab. ?-We will arrange for you to have a CT scan of your chest in approximately 3 months.  We  would arrange for you to come back a few days later for an office visit and to review the scan and further recommendations at that time. ?-Of course if you have any concerning symptoms in the interval we would always encourage you to call back to be evaluated sooner. ?

## 2021-06-25 ENCOUNTER — Ambulatory Visit (INDEPENDENT_AMBULATORY_CARE_PROVIDER_SITE_OTHER): Payer: Commercial Managed Care - PPO | Admitting: Emergency Medicine

## 2021-06-25 ENCOUNTER — Encounter: Payer: Self-pay | Admitting: Emergency Medicine

## 2021-06-25 DIAGNOSIS — J432 Centrilobular emphysema: Secondary | ICD-10-CM

## 2021-06-25 DIAGNOSIS — K219 Gastro-esophageal reflux disease without esophagitis: Secondary | ICD-10-CM

## 2021-06-25 DIAGNOSIS — J3089 Other allergic rhinitis: Secondary | ICD-10-CM

## 2021-06-25 DIAGNOSIS — R0602 Shortness of breath: Secondary | ICD-10-CM

## 2021-06-25 DIAGNOSIS — J449 Chronic obstructive pulmonary disease, unspecified: Secondary | ICD-10-CM

## 2021-06-25 LAB — PULMONARY FUNCTION TEST
DL/VA % pred: 82 %
DL/VA: 3.53 ml/min/mmHg/L
DLCO cor % pred: 73 %
DLCO cor: 14.06 ml/min/mmHg
DLCO unc % pred: 70 %
DLCO unc: 13.46 ml/min/mmHg
FEF 25-75 Post: 1.6 L/sec
FEF 25-75 Pre: 1.56 L/sec
FEF2575-%Change-Post: 2 %
FEF2575-%Pred-Post: 67 %
FEF2575-%Pred-Pre: 66 %
FEV1-%Change-Post: 1 %
FEV1-%Pred-Post: 89 %
FEV1-%Pred-Pre: 88 %
FEV1-Post: 2.2 L
FEV1-Pre: 2.16 L
FEV1FVC-%Change-Post: 4 %
FEV1FVC-%Pred-Pre: 92 %
FEV6-%Change-Post: -1 %
FEV6-%Pred-Post: 95 %
FEV6-%Pred-Pre: 97 %
FEV6-Post: 2.91 L
FEV6-Pre: 2.97 L
FEV6FVC-%Change-Post: 0 %
FEV6FVC-%Pred-Post: 103 %
FEV6FVC-%Pred-Pre: 103 %
FVC-%Change-Post: -2 %
FVC-%Pred-Post: 92 %
FVC-%Pred-Pre: 94 %
FVC-Post: 2.91 L
FVC-Pre: 2.97 L
Post FEV1/FVC ratio: 76 %
Post FEV6/FVC ratio: 100 %
Pre FEV1/FVC ratio: 73 %
Pre FEV6/FVC Ratio: 100 %
RV % pred: 63 %
RV: 1.16 L
TLC % pred: 93 %
TLC: 4.43 L

## 2021-06-25 MED ORDER — STIOLTO RESPIMAT 2.5-2.5 MCG/ACT IN AERS: 2.0000 | INHALATION_SPRAY | Freq: Every day | RESPIRATORY_TRACT | 0 refills | Status: DC

## 2021-06-25 NOTE — Assessment & Plan Note (Signed)
Significant shortness of breath, has been progressive since she underwent her radiation and her immunotherapy.  Her CT scan of the chest does not show any PE.  She does not desaturate with exertion.  Her pulmonary function testing is almost normal with only some subtle evidence for mild obstruction and restricted RV.  CT chest concerning for possible evolving pneumonitis versus scar in the right upper lobe.  I suspect that it scarring as I do not see much in the way groundglass.  Agree with empiric treatment for possible pneumonitis with a long prednisone taper as planned.  Hopefully she will show improvement.  She may benefit going forward from pulmonary rehab.  We will plan to probably recheck her CT scan of the chest after the prednisone course to see how much of her right upper lobe infiltrate/scar improves.

## 2021-06-25 NOTE — Patient Instructions (Signed)
Full PFT Performed Today  

## 2021-06-25 NOTE — Progress Notes (Signed)
Full PFT Performed Today  

## 2021-06-25 NOTE — Assessment & Plan Note (Signed)
Mild obstruction by PFT.  She did not like the Trelegy, did not really seem to get significant change in her breathing as compared to Spiriva.  I will change her to [facility] today.

## 2021-06-25 NOTE — Assessment & Plan Note (Signed)
Continue loratadine and Flonase as you take them

## 2021-06-25 NOTE — Progress Notes (Signed)
Subjective:    Patient ID: Julie Nicholson, female    DOB: 01-09-1966, 56 y.o.   MRN: [id number]  HPI  ROV 04/07/21 --follow-up visit for 56 year old woman with a history of COPD, GERD, allergies.  She has a new diagnosis of adenocarcinoma doing chemoradiation. Now started on Imfinzi.  She is having more fatigue over the last 2-3 weeks, associated with exertional SOB. She has albuterol, has used a few times recently without any real change in how she feels. She ascribes some of this to allergy season - no real cough, some sinus congestion. No wheeze. Uses flonase prn. Protonix qd.   CT-PA 02/22/2021 reviewed, showed no evidence of pulmonary embolism, decreasing right upper lobe pulmonary nodule and bulky right hilar lymphadenopathy.  CT chest 03/12/2021 reviewed by me shows further improvement in the previously demonstrated right nodal metastases to the mediastinum.  Spiculated right upper lobe nodule stable to slightly smaller.  No new findings.   ROV 06/25/21 --56 year old woman whom I follow for COPD and right upper lobe and right mediastinal adenocarcinoma that was treated with chemoradiation, then Imfinzi.  She was seen 1 month ago in our office with nonresolving flare of her COPD, chest discomfort.  She was treated with antibiotics, extended steroids and underwent a CTPA on 4/26 as below Currently managed on Trelegy (changed last month from spiriva).  She is on Protonix and Pepcid, loratadine Flonase as needed No desat on walking oximetry today.  She is a bit better - less cough. Her dyspnea remains severe. 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  New right upper lobe and right lower lobe interstitial thickening that is obscuring her previously seen right upper lobe nodule, likely due to her XRT but difficult to exclude coexisting  pneumonia  Pulmonary function testing performed today and reviewed by me shows grossly normal airflows although there may be some evidence for mild obstruction.  No bronchodilator response.  Possible restriction based on decreased RV.  The TLC is normal.  Her diffusion capacity is decreased and corrects to the normal range when adjusted for alveolar volume.    Review of Systems As per HPI  Past Medical History:  Diagnosis Date   Abdominal discomfort    Cancer ([place])    Chronic headaches    due to allergies, sinus   COPD (chronic obstructive pulmonary disease) ([place])    per 2012 chest xray   pt states she doesn not have this now (04/10/2013)   Deviated nasal septum    Eustachian tube dysfunction    GERD (gastroesophageal reflux disease)    occasional uses Tums / Rolaids   Hearing loss    right ear   High cholesterol    History of radiation therapy    right lung 01/07/2021-02/19/2021  Dr Gery Pray   Migraine    "only once in a blue moon since RX'd allergy shots" (04/10/2013)   Pancreatitis 02/08/2013   Pneumonia    Rhinitis, allergic      Family History  Problem Relation Age of Onset   Diabetes Father    Diabetes Mother    COPD Mother    COPD Maternal Grandfather    COPD Maternal Aunt    COPD Maternal Uncle      Social History   Socioeconomic History   Marital status: Married    Spouse name: Not on file  Number of children: Not on file   Years of education: Not on file   Highest education level: Not on file  Occupational History   Not on file  Tobacco Use   Smoking status: Former    Packs/day: 1.00    Years: 41.00    Pack years: 41.00    Types: Cigarettes    Quit date: 12/23/2020    Years since quitting: 0.5   Smokeless tobacco: Never  Vaping Use   Vaping Use: Never used  Substance and Sexual Activity   Alcohol use: Not Currently    Comment: occasional   Drug use: No   Sexual activity: Yes    Birth control/protection: Surgical    Comment: Hysterectomy   Other Topics Concern   Not on file  Social History Narrative   Not on file   Social Determinants of Health   Financial Resource Strain: Not on file  Food Insecurity: Not on file  Transportation Needs: Not on file  Physical Activity: Not on file  Stress: Not on file  Social Connections: Not on file  Intimate Partner Violence: Not on file     Allergies  Allergen Reactions   Benadryl [Diphenhydramine] Other (See Comments)    Oversedation - 50 mg IV given 01/05/21; switched to IV Certirizine.     Outpatient Medications Prior to Visit  Medication Sig Dispense Refill   acetaminophen (TYLENOL) 500 MG tablet Take 1,000 mg by mouth every 6 (six) hours as needed for moderate pain.     albuterol (PROVENTIL) (2.5 MG/3ML) 0.083% nebulizer solution albuterol sulfate 2.5 mg/3 mL (0.083 %) solution for nebulization  USE 1 VIAL IN NEBULIZER EVERY 6 HOURS AS NEEDED FOR WHEEZING OR SHORTNESS OF BREATH     albuterol (VENTOLIN HFA) 108 (90 Base) MCG/ACT inhaler Inhale 1-2 puffs into the lungs every 6 (six) hours as needed for wheezing or shortness of breath. 6.7 g 5   aspirin EC 81 MG tablet Take 81 mg by mouth at bedtime.     atorvastatin (LIPITOR) 40 MG tablet Take 40 mg by mouth at bedtime.     benzonatate (TESSALON) 100 MG capsule Take 1-2 capsules (100-200 mg total) by mouth 3 (three) times daily as needed for cough. 30 capsule 0   Biotin 5000 MCG TABS Take 5,000 mcg by mouth at bedtime.     buPROPion (WELLBUTRIN SR) 150 MG 12 hr tablet Take 1 tablet (150 mg total) by mouth 2 (two) times daily. 60 tablet 5   Ca Carbonate-Mag Hydroxide (ROLAIDS PO) Take 1 tablet by mouth daily as needed (heartburn).     famotidine (PEPCID) 20 MG tablet Take 1 tablet by mouth twice daily 60 tablet 3   fluticasone (FLONASE) 50 MCG/ACT nasal spray Place 2 sprays into both nostrils daily as needed for allergies.     Fluticasone-Umeclidin-Vilant (TRELEGY ELLIPTA) 200-62.5-25 MCG/ACT AEPB Inhale 1 puff into the lungs  daily. 60 each 2   lidocaine (XYLOCAINE) 2 % solution Swish/gargle as directed 15 mLs in the mouth or throat every 4 (four) hours as needed for mouth pain (throat pain). 100 mL 0   loratadine (CLARITIN) 10 MG tablet Take 1 tablet (10 mg total) by mouth daily. 30 tablet 5   Multiple Vitamins-Minerals (HAIR SKIN AND NAILS FORMULA PO) Take 1 tablet by mouth daily.     ondansetron (ZOFRAN-ODT) 8 MG disintegrating tablet Take by mouth.     Oxycodone HCl 10 MG TABS Take 1 tablet by mouth 3 (three) times daily as needed (pain).  predniSONE (DELTASONE) 10 MG tablet Please take 5 tablets (50 mg) every morning for 1 week, followed by 4 tablets (40 mg) daily for 1 week, followed by 3 tablets (30 mg) daily for 1 week, followed by 2 tablets (20 mg) daily for 1 week, followed by 1 tablet (10 mg) daily for 1 week 105 tablet 0   prochlorperazine (COMPAZINE) 10 MG tablet Take 1 tablet (10 mg total) by mouth every 6 (six) hours as needed. 30 tablet 2   sucralfate (CARAFATE) 1 g tablet Dissolve 1 tablet in 10 mL H20 and swallow 30 min prior to meals and bedtime. 90 tablet 0   UBRELVY 100 MG TABS Take 100 mg by mouth daily as needed (migraine).     pantoprazole (PROTONIX) 40 MG tablet Take 1 tablet (40 mg total) by mouth daily. 30 tablet 5   No facility-administered medications prior to visit.        Objective:   Physical Exam Vitals:   06/25/21 1639  BP: 118/68  Pulse: (!) 111  Temp: 98.7 F (37.1 C)  TempSrc: Oral  SpO2: 98%  Weight: 111 lb (50.3 kg)  Height: 5\' 2"  (1.575 m)    Gen: Pleasant, well-nourished, in no distress,  normal affect  ENT: No lesions,  mouth clear,  oropharynx clear, no postnasal drip  Neck: No JVD, no stridor  Lungs: No use of accessory muscles, few focal R insp crackles, no wheeze on forced expiration  Cardiovascular: RRR, heart sounds normal, no murmur or gallops, no peripheral edema  Musculoskeletal: No deformities, no cyanosis or clubbing  Neuro: alert, awake,  non focal  Skin: Warm, no lesions or rash       Assessment & Plan:   Dyspnea Significant shortness of breath, has been progressive since she underwent her radiation and her immunotherapy.  Her CT scan of the chest does not show any PE.  She does not desaturate with exertion.  Her pulmonary function testing is almost normal with only some subtle evidence for mild obstruction and restricted RV.  CT chest concerning for possible evolving pneumonitis versus scar in the right upper lobe.  I suspect that it scarring as I do not see much in the way groundglass.  Agree with empiric treatment for possible pneumonitis with a long prednisone taper as planned.  Hopefully she will show improvement.  She may benefit going forward from pulmonary rehab.  We will plan to probably recheck her CT scan of the chest after the prednisone course to see how much of her right upper lobe infiltrate/scar improves.  COPD (chronic obstructive pulmonary disease) ([facility]) Mild obstruction by PFT.  She did not like the Trelegy, did not really seem to get significant change in her breathing as compared to Spiriva.  I will change her to [facility] today.  GERD (gastroesophageal reflux disease) Continue Protonix and Pepcid as you take them  Allergic rhinitis Continue loratadine and Flonase as you take them    Baltazar Apo, MD, PhD 06/25/2021, 5:10 PM [facility] [phone] or if no answer before 7:00PM call [phone] For any issues after 7:00PM please call eLink [phone]

## 2021-06-25 NOTE — Assessment & Plan Note (Signed)
Continue Protonix and Pepcid as you take them

## 2021-06-25 NOTE — Patient Instructions (Addendum)
Please continue your prednisone taper as ordered by Dr. Julien Nordmann. We will determine the timing of a repeat CT scan of the chest after you have completed the prednisone course. Stop Trelegy We will start Stiolto 2 puffs once daily. Keep your albuterol available to use 2 puffs if needed for shortness of breath, chest tightness, wheezing. Your oxygen level stayed at goal with walking today.  We do not need to order oxygen for you to use at home. Continue loratadine and Flonase as you take them Continue Protonix and Pepcid as you take them Agree with working with your job to extend to long-term disability Follow with Dr Lamonte Sakai in 1 month or next available

## 2021-07-09 ENCOUNTER — Telehealth (HOSPITAL_COMMUNITY): Payer: Self-pay

## 2021-07-09 NOTE — Telephone Encounter (Signed)
Pt insurance is active and benefits verified through [facility] Co-pay $0.00, DED $2,500.00/$2,500.00 met, out of pocket $6,200.00/$6,200.00 met, co-insurance 30%. No pre-authorization required. Abby/[facility], 07/08/21 @ 12:33PM, HKU#[id number]

## 2021-07-09 NOTE — Telephone Encounter (Signed)
Called patient to see if she was interested in participating in the Pulmonary Rehab Program. Patient stated yes. Patient will come in for orientation on 07/24/21 @ 9AM and will attend the 10:15AM exercise class. Went over insurance, patient verbalized understanding.    Tourist information centre manager.

## 2021-07-15 ENCOUNTER — Ambulatory Visit: Payer: Commercial Managed Care - PPO | Admitting: Internal Medicine

## 2021-07-15 ENCOUNTER — Encounter: Payer: Commercial Managed Care - PPO | Admitting: Dietician

## 2021-07-15 ENCOUNTER — Ambulatory Visit: Payer: Commercial Managed Care - PPO

## 2021-07-15 ENCOUNTER — Other Ambulatory Visit: Payer: Commercial Managed Care - PPO

## 2021-07-20 ENCOUNTER — Encounter: Payer: Self-pay | Admitting: *Deleted

## 2021-07-20 NOTE — Progress Notes (Signed)
I followed up on Julie Nicholson schedule. She is set up to see med onc in August.

## 2021-07-22 ENCOUNTER — Telehealth (HOSPITAL_COMMUNITY): Payer: Self-pay

## 2021-07-22 NOTE — Telephone Encounter (Signed)
Called Julie Nicholson to confirm Pulmonary Rehab orientation appointment. She did not answer, so I left a message to give [place] a call back. Gave department number.

## 2021-07-23 ENCOUNTER — Telehealth (HOSPITAL_COMMUNITY): Payer: Self-pay | Admitting: *Deleted

## 2021-07-23 NOTE — Telephone Encounter (Signed)
Called to confirm appt. Pt confirmed appt. Instructed pt on proper footwear and COVID screening. Gave directions along with department number.   

## 2021-07-24 ENCOUNTER — Encounter (HOSPITAL_COMMUNITY): Payer: Self-pay

## 2021-07-24 ENCOUNTER — Encounter (HOSPITAL_COMMUNITY)
Admission: RE | Admit: 2021-07-24 | Discharge: 2021-07-24 | Disposition: A | Discharge status: Home / Self Care | Payer: Commercial Managed Care - PPO | Source: Ambulatory Visit | Attending: Emergency Medicine | Admitting: Emergency Medicine

## 2021-07-24 VITALS — BP 104/50 | Ht 62.0 in | Wt 113.8 lb

## 2021-07-24 DIAGNOSIS — J432 Centrilobular emphysema: Secondary | ICD-10-CM | POA: Insufficient documentation

## 2021-07-24 NOTE — Progress Notes (Signed)
Pulmonary Rehab Orientation Physical Assessment Note  Physical assessment reveals  Pt is alert and oriented x 3.  Heart rate is normal, breath sounds clear to auscultation, no wheezes, rales, or rhonchi. Reports productive at times but mostly non-productive cough. Bowel sounds present.  Pt denies abdominal discomfort, nausea, vomiting or diarrhea. Grip strength equal, strong. Distal pulses palpable; no swelling to lower extremities. Alanson Aly, BSN Cardiac and Emergency planning/management officer

## 2021-07-30 ENCOUNTER — Encounter (HOSPITAL_COMMUNITY)
Admission: RE | Admit: 2021-07-30 | Discharge: 2021-07-30 | Disposition: A | Discharge status: Home / Self Care | Payer: Commercial Managed Care - PPO | Source: Ambulatory Visit | Attending: Emergency Medicine | Admitting: Emergency Medicine

## 2021-07-30 VITALS — Wt 114.6 lb

## 2021-07-30 DIAGNOSIS — J432 Centrilobular emphysema: Secondary | ICD-10-CM | POA: Diagnosis not present

## 2021-07-30 NOTE — Progress Notes (Signed)
Daily Session Note  Patient Details  Name: Julie Nicholson MRN: [id number] Date of Birth: July 21, 1965 Referring Provider:   April Manson Pulmonary Rehab Walk Test from 07/24/2021 in [place]  Referring Provider [name]       Encounter Date: 07/30/2021  Check In:  Session Check In - 07/30/21 1156       Check-In   Supervising physician immediately available to respond to emergencies [facility] immediately available    Physician(s) Dr. Louanne Belton    Location [facility]    Staff Present Rosebud Poles, RN, Quentin Ore, MS, ACSM-CEP, Exercise Physiologist;Fany Cavanaugh Ysidro Evert, RN    Virtual Visit No    Medication changes reported     No    Fall or balance concerns reported    No    Tobacco Cessation No Change    Warm-up and Cool-down Performed as group-led instruction    Resistance Training Performed Yes    VAD Patient? No    PAD/SET Patient? No      Pain Assessment   Currently in Pain? No/denies    Multiple Pain Sites No             Capillary Blood Glucose: No results found for this or any previous visit (from the past 24 hour(s)).    Social History   Tobacco Use  Smoking Status Former   Packs/day: 0.50   Years: 41.00   Total pack years: 20.50   Types: Cigarettes   Quit date: 12/23/2020   Years since quitting: 0.6  Smokeless Tobacco Never    Goals Met:  Exercise tolerated well No report of concerns or symptoms today Strength training completed today  Goals Unmet:  Not Applicable  Comments: Service time is from 1135 to 1020    Dr. Rodman Pickle is Medical Director for Pulmonary Rehab at [facility].

## 2021-08-06 ENCOUNTER — Encounter (HOSPITAL_COMMUNITY)
Admission: RE | Admit: 2021-08-06 | Discharge: 2021-08-06 | Disposition: A | Discharge status: Home / Self Care | Payer: Commercial Managed Care - PPO | Source: Ambulatory Visit | Attending: Emergency Medicine | Admitting: Emergency Medicine

## 2021-08-06 ENCOUNTER — Telehealth: Payer: Self-pay | Admitting: Emergency Medicine

## 2021-08-06 ENCOUNTER — Other Ambulatory Visit: Payer: Self-pay | Admitting: Emergency Medicine

## 2021-08-06 VITALS — Wt 115.3 lb

## 2021-08-06 DIAGNOSIS — J432 Centrilobular emphysema: Secondary | ICD-10-CM | POA: Insufficient documentation

## 2021-08-06 DIAGNOSIS — Z5189 Encounter for other specified aftercare: Secondary | ICD-10-CM | POA: Diagnosis not present

## 2021-08-06 DIAGNOSIS — Z0289 Encounter for other administrative examinations: Secondary | ICD-10-CM

## 2021-08-06 NOTE — Progress Notes (Signed)
Daily Session Note  Patient Details  Name: Julie Nicholson MRN: [id number] Date of Birth: 1965-09-21 Referring Provider:   April Manson Pulmonary Rehab Walk Test from 07/24/2021 in [place]  Referring Provider [name]       Encounter Date: 08/06/2021  Check In:  Session Check In - 08/06/21 1122       Check-In   Supervising physician immediately available to respond to emergencies [facility] immediately available    Physician(s) Dr. Grandville Silos    [facility]    Staff Present Rosebud Poles, RN, BSN;Ariane Ditullio Ysidro Evert, Cathleen Fears, MS, ACSM-CEP, Exercise Physiologist    Virtual Visit No    Medication changes reported     No    Fall or balance concerns reported    No    Tobacco Cessation No Change    Warm-up and Cool-down Performed as group-led instruction    Resistance Training Performed Yes    VAD Patient? No    PAD/SET Patient? No      Pain Assessment   Currently in Pain? No/denies    Multiple Pain Sites No             Capillary Blood Glucose: No results found for this or any previous visit (from the past 24 hour(s)).    Social History   Tobacco Use  Smoking Status Former   Packs/day: 0.50   Years: 41.00   Total pack years: 20.50   Types: Cigarettes   Quit date: 12/23/2020   Years since quitting: 0.6  Smokeless Tobacco Never    Goals Met:  Proper associated with RPD/PD & O2 Sat Exercise tolerated well No report of concerns or symptoms today Strength training completed today  Goals Unmet:  Not Applicable  Comments: Service time is from [place] to [place]    Dr. Rodman Pickle is Medical Director for Pulmonary Rehab at [facility].

## 2021-08-06 NOTE — Telephone Encounter (Signed)
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  I called her and she explained that Dr. Mckinley Jewel had been filling the forms out previously, but since she is seeing Dr. Lamonte Sakai more often, she believes he is the right person to fill it out now.  Her last day at work was 12/25/2020 - she has not worked at all since then.  She is in the process of filing for long term disability.  When completed, this form should document that she is not able to work.  Sent form to Dr. Lamonte Sakai to complete during her 7/13 appointment.  She is aware of the $29 processing fee.

## 2021-08-10 ENCOUNTER — Telehealth: Payer: Self-pay | Admitting: Emergency Medicine

## 2021-08-10 ENCOUNTER — Other Ambulatory Visit: Payer: Self-pay | Admitting: Pulmonary Disease

## 2021-08-10 NOTE — Telephone Encounter (Signed)
Looked at pt's chart and noticed that the  Rx had already been sent to pharmacy for pt 7/7. Nothing further needed.

## 2021-08-10 NOTE — Telephone Encounter (Signed)
Yes I am ok with this

## 2021-08-10 NOTE — Telephone Encounter (Signed)
Fax received requesting a refill for pt's bupropion ER/SR 150mg , quantity 60 tabs with instructions to take 1 tablet by mouth twice daily.  Dr. Lamonte Sakai, please advise if you are okay refilling med.

## 2021-08-11 ENCOUNTER — Encounter (HOSPITAL_COMMUNITY)
Admission: RE | Admit: 2021-08-11 | Discharge: 2021-08-11 | Disposition: A | Discharge status: Home / Self Care | Payer: Commercial Managed Care - PPO | Source: Ambulatory Visit | Attending: Emergency Medicine | Admitting: Emergency Medicine

## 2021-08-11 VITALS — Wt 116.2 lb

## 2021-08-11 DIAGNOSIS — J432 Centrilobular emphysema: Secondary | ICD-10-CM

## 2021-08-11 DIAGNOSIS — Z5189 Encounter for other specified aftercare: Secondary | ICD-10-CM | POA: Diagnosis not present

## 2021-08-11 NOTE — Progress Notes (Signed)
Daily Session Note  Patient Details  Name: Julie Nicholson MRN: [id number] Date of Birth: 05/26/1965 Referring Provider:   April Manson Pulmonary Rehab Walk Test from 07/24/2021 in [place]  Referring Provider [name]       Encounter Date: 08/11/2021  Check In:  Session Check In - 08/11/21 1153       Check-In   Supervising physician immediately available to respond to emergencies [facility] immediately available    Physician(s) Dr. Grandville Silos    [facility]    Staff Present Rosebud Poles, RN, Quentin Ore, MS, ACSM-CEP, Exercise Physiologist    Virtual Visit No    Medication changes reported     No    Fall or balance concerns reported    No    Tobacco Cessation No Change    Warm-up and Cool-down Performed as group-led instruction    Resistance Training Performed Yes    VAD Patient? No    PAD/SET Patient? No      Pain Assessment   Currently in Pain? No/denies    Multiple Pain Sites No             Capillary Blood Glucose: No results found for this or any previous visit (from the past 24 hour(s)).   Exercise Prescription Changes - 08/11/21 1200       Response to Exercise   Blood Pressure (Admit) 122/60    Blood Pressure (Exercise) 128/66    Blood Pressure (Exit) 102/64    Heart Rate (Admit) 95 bpm    Heart Rate (Exercise) 130 bpm    Heart Rate (Exit) 92 bpm    Oxygen Saturation (Admit) 100 %    Oxygen Saturation (Exercise) 95 %    Oxygen Saturation (Exit) 98 %    Rating of Perceived Exertion (Exercise) 13    Perceived Dyspnea (Exercise) 1    Intensity --   40-80 % HRR     Resistance Training   Training Prescription Yes    Weight Blue bands    Reps 10-15    Time 10 Minutes      Treadmill   MPH 2.1    Grade 0    Minutes 15    METs 2.61      NuStep   Level 2    SPM 70    Minutes 15    METs 2.8             Social History   Tobacco Use  Smoking Status Former   Packs/day:  0.50   Years: 41.00   Total pack years: 20.50   Types: Cigarettes   Quit date: 12/23/2020   Years since quitting: 0.6  Smokeless Tobacco Never    Goals Met:  Proper associated with RPD/PD & O2 Sat Exercise tolerated well No report of concerns or symptoms today Strength training completed today  Goals Unmet:  Not Applicable  Comments: Service time is from 1025 to 1140.    Dr. Rodman Pickle is Medical Director for Pulmonary Rehab at [facility].

## 2021-08-12 ENCOUNTER — Encounter: Payer: Self-pay | Admitting: Emergency Medicine

## 2021-08-12 ENCOUNTER — Ambulatory Visit (INDEPENDENT_AMBULATORY_CARE_PROVIDER_SITE_OTHER): Payer: Commercial Managed Care - PPO | Admitting: Emergency Medicine

## 2021-08-12 DIAGNOSIS — J700 Acute pulmonary manifestations due to radiation: Secondary | ICD-10-CM

## 2021-08-12 DIAGNOSIS — J449 Chronic obstructive pulmonary disease, unspecified: Secondary | ICD-10-CM

## 2021-08-12 HISTORY — DX: Acute pulmonary manifestations due to radiation: J70.0

## 2021-08-12 MED ORDER — STIOLTO RESPIMAT 2.5-2.5 MCG/ACT IN AERS: 2.0000 | INHALATION_SPRAY | Freq: Every day | RESPIRATORY_TRACT | 3 refills | Status: DC

## 2021-08-12 NOTE — Telephone Encounter (Signed)
Dr. Lamonte Sakai signed short term disability form.  Faxed it to [facility] fax# [phone]

## 2021-08-12 NOTE — Assessment & Plan Note (Signed)
We will send a prescription for Stiolto to your pharmacy.  Please continue 2 puffs once daily. Keep your albuterol available to use 2 puffs up to every 4 hours if needed for shortness of breath, chest tightness, wheezing. We completed your short-term disability paperwork today. Follow Dr. Lamonte Sakai in 4 months.  Sooner if you have any problems.

## 2021-08-12 NOTE — Patient Instructions (Addendum)
We will send a prescription for Stiolto to your pharmacy.  Please continue 2 puffs once daily. Keep your albuterol available to use 2 puffs up to every 4 hours if needed for shortness of breath, chest tightness, wheezing. Get your repeat CT scan of the chest as planned by Dr. Julien Nordmann now that your prednisone has been completed. We completed your short-term disability paperwork today. Follow Dr. Lamonte Sakai in 4 months.  Sooner if you have any problems.

## 2021-08-12 NOTE — Addendum Note (Signed)
Addended by: Gavin Potters R on: 08/12/2021 11:51 AM   Modules accepted: Orders

## 2021-08-12 NOTE — Progress Notes (Signed)
Subjective:    Patient ID: Julie Nicholson, female    DOB: [birth year], 56 y.o.   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  She was seen 1 month ago in our office with nonresolving flare of her COPD, chest discomfort.  She was treated with antibiotics, extended steroids and underwent a CTPA on 4/26 as below Currently managed on Trelegy (changed last month from spiriva).  She is on Protonix and Pepcid, loratadine Flonase as needed No desat on walking oximetry today.  She is a bit better - less cough. Her dyspnea remains severe. 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  New right upper lobe and right lower lobe interstitial thickening that is obscuring her previously seen right upper lobe nodule, likely due to her XRT but difficult to exclude coexisting pneumonia  Pulmonary function testing performed today and reviewed by me shows grossly normal airflows although there may be some evidence for mild obstruction.  No bronchodilator response.  Possible restriction based on decreased RV.  The TLC is normal.  Her diffusion capacity is decreased and corrects to the normal range when adjusted for alveolar volume.  ROV 08/12/2021 --follow-up visit 57 year old woman with new diagnosis of right upper lobe and mediastinal adenocarcinoma treated with chemoradiation and then Imfinzi - stopped due to possible pneumonitis.  Mild obstruction on pulmonary function testing from 06/2021.  She does have some evolving scar in the right upper lobe posttreatment, question pneumonitis which was treated with a prolonged prednisone taper.  I tried changing Trelegy to [employer] at her last visit.  Today she reports that she still  has mid upper chest discomfort and pressure. She feels that she has benefited from the [facility], has started pulmonary rehab.has some SOB w exertion. Her cough is present but better.  She is due for a repeat Ct chest in August. No desaturations w exertion noted.    Review of Systems As per HPI  Past Medical History:  Diagnosis Date   Abdominal discomfort    Cancer ([place])    Chronic headaches    due to allergies, sinus   COPD (chronic obstructive pulmonary disease) ([place])    per 2012 chest xray   pt states she doesn not have this now (04/10/2013)   Deviated nasal septum    Eustachian tube dysfunction    GERD (gastroesophageal reflux disease)    occasional uses Tums / Rolaids   Hearing loss    right ear   High cholesterol    History of radiation therapy    right lung 01/07/2021-02/19/2021  Dr Gery Pray   Migraine    "only once in a blue moon since RX'd allergy shots" (04/10/2013)   Pancreatitis 02/08/2013   Pneumonia    Rhinitis, allergic      Family History  Problem Relation Age of Onset   Diabetes Father    Diabetes Mother    COPD Mother    COPD Maternal Grandfather    COPD Maternal Aunt    COPD Maternal Uncle      Social History   Socioeconomic History   Marital status: Married    Spouse name: Not on file   Number of children: Not on file   Years of education: 18  Highest education level: 12th grade  Occupational History   Not on file  Tobacco Use   Smoking status: Former    Packs/day: 0.50    Years: 41.00    Total pack years: 20.50    Types: Cigarettes    Quit date: 12/23/2020    Years since quitting: 0.6   Smokeless tobacco: Never  Vaping Use   Vaping Use: Never used  Substance and Sexual Activity   Alcohol use: Not Currently    Comment: occasional   Drug use: No   Sexual activity: Yes    Birth control/protection: Surgical    Comment: Hysterectomy  Other Topics Concern   Not on file  Social History Narrative   Not on file   Social Determinants  of Health   Financial Resource Strain: Not on file  Food Insecurity: Not on file  Transportation Needs: Not on file  Physical Activity: Not on file  Stress: Not on file  Social Connections: Not on file  Intimate Partner Violence: Not on file     Allergies  Allergen Reactions   Benadryl [Diphenhydramine] Other (See Comments)    Oversedation - 50 mg IV given 01/05/21; switched to IV Certirizine.     Outpatient Medications Prior to Visit  Medication Sig Dispense Refill   acetaminophen (TYLENOL) 500 MG tablet Take 1,000 mg by mouth every 6 (six) hours as needed for moderate pain.     albuterol (PROVENTIL) (2.5 MG/3ML) 0.083% nebulizer solution albuterol sulfate 2.5 mg/3 mL (0.083 %) solution for nebulization  USE 1 VIAL IN NEBULIZER EVERY 6 HOURS AS NEEDED FOR WHEEZING OR SHORTNESS OF BREATH     albuterol (VENTOLIN HFA) 108 (90 Base) MCG/ACT inhaler Inhale 1-2 puffs into the lungs every 6 (six) hours as needed for wheezing or shortness of breath. 6.7 g 5   aspirin EC 81 MG tablet Take 81 mg by mouth at bedtime.     atorvastatin (LIPITOR) 40 MG tablet Take 40 mg by mouth at bedtime.     benzonatate (TESSALON) 100 MG capsule Take 1-2 capsules (100-200 mg total) by mouth 3 (three) times daily as needed for cough. 30 capsule 0   Biotin 5000 MCG TABS Take 5,000 mcg by mouth at bedtime.     buPROPion (WELLBUTRIN SR) 150 MG 12 hr tablet Take 1 tablet by mouth twice daily 60 tablet 0   Ca Carbonate-Mag Hydroxide (ROLAIDS PO) Take 1 tablet by mouth daily as needed (heartburn).     famotidine (PEPCID) 20 MG tablet Take 1 tablet by mouth twice daily 60 tablet 3   fluticasone (FLONASE) 50 MCG/ACT nasal spray Place 2 sprays into both nostrils daily as needed for allergies.     Fluticasone-Umeclidin-Vilant (TRELEGY ELLIPTA) 200-62.5-25 MCG/ACT AEPB INHALE 1 PUFF ONCE DAILY 60 each 2   lidocaine (XYLOCAINE) 2 % solution Swish/gargle as directed 15 mLs in the mouth or throat every 4 (four) hours as needed  for mouth pain (throat pain). 100 mL 0   loratadine (CLARITIN) 10 MG tablet Take 1 tablet (10 mg total) by mouth daily. 30 tablet 5   Multiple Vitamins-Minerals (HAIR SKIN AND NAILS FORMULA PO) Take 1 tablet by mouth daily.     ondansetron (ZOFRAN-ODT) 8 MG disintegrating tablet Take by mouth.     Oxycodone HCl 10 MG TABS Take 1 tablet by mouth 3 (three) times daily as needed (pain).     predniSONE (DELTASONE) 10 MG tablet Please take 5 tablets (50 mg) every morning for 1 week, followed by 4  tablets (40 mg) daily for 1 week, followed by 3 tablets (30 mg) daily for 1 week, followed by 2 tablets (20 mg) daily for 1 week, followed by 1 tablet (10 mg) daily for 1 week 105 tablet 0   prochlorperazine (COMPAZINE) 10 MG tablet Take 1 tablet (10 mg total) by mouth every 6 (six) hours as needed. 30 tablet 2   sucralfate (CARAFATE) 1 g tablet Dissolve 1 tablet in 10 mL H20 and swallow 30 min prior to meals and bedtime. 90 tablet 0   UBRELVY 100 MG TABS Take 100 mg by mouth daily as needed (migraine).     pantoprazole (PROTONIX) 40 MG tablet Take 1 tablet (40 mg total) by mouth daily. 30 tablet 5   No facility-administered medications prior to visit.        Objective:   Physical Exam Vitals:   08/12/21 1106  BP: 118/72  Pulse: (!) 101  Temp: 98.3 F (36.8 C)  TempSrc: Oral  SpO2: 99%  Weight: 115 lb 12.8 oz (52.5 kg)  Height: 5\' 2"  (1.575 m)    Gen: Pleasant, well-nourished, in no distress,  normal affect  ENT: No lesions,  mouth clear,  oropharynx clear, no postnasal drip  Neck: No JVD, no stridor  Lungs: No use of accessory muscles, few focal R insp crackles, no wheeze on forced expiration  Cardiovascular: RRR, heart sounds normal, no murmur or gallops, no peripheral edema  Musculoskeletal: No deformities, no cyanosis or clubbing  Neuro: alert, awake, non focal  Skin: Warm, no lesions or rash       Assessment & Plan:   COPD (chronic obstructive pulmonary disease) ([place]) We  will send a prescription for Stiolto to your pharmacy.  Please continue 2 puffs once daily. Keep your albuterol available to use 2 puffs up to every 4 hours if needed for shortness of breath, chest tightness, wheezing. We completed your short-term disability paperwork today. Follow Dr. Lamonte Sakai in 4 months.  Sooner if you have any problems.  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 Julien Nordmann now that your prednisone has been completed.    Baltazar Apo, MD, PhD 08/12/2021, 11:40 AM [facility] [phone] or if no answer before 7:00PM call [phone] For any issues after 7:00PM please call eLink [phone]

## 2021-08-12 NOTE — Assessment & Plan Note (Signed)
Get your repeat CT scan of the chest as planned by Dr. Julien Nordmann now that your prednisone has been completed.

## 2021-08-13 ENCOUNTER — Encounter (HOSPITAL_COMMUNITY)
Admission: RE | Admit: 2021-08-13 | Discharge: 2021-08-13 | Disposition: A | Discharge status: Home / Self Care | Payer: Commercial Managed Care - PPO | Source: Ambulatory Visit | Attending: Emergency Medicine | Admitting: Emergency Medicine

## 2021-08-13 DIAGNOSIS — J432 Centrilobular emphysema: Secondary | ICD-10-CM

## 2021-08-13 DIAGNOSIS — Z5189 Encounter for other specified aftercare: Secondary | ICD-10-CM | POA: Diagnosis not present

## 2021-08-13 NOTE — Progress Notes (Signed)
Daily Session Note  Patient Details  Name: Julie Nicholson MRN: [id number] Date of Birth: August 07, 1965 Referring Provider:   April Manson Pulmonary Rehab Walk Test from 07/24/2021 in [place]  Referring Provider [name]       Encounter Date: 08/13/2021  Check In:  Session Check In - 08/13/21 1139       Check-In   Supervising physician immediately available to respond to emergencies [facility] immediately available    Physician(s) Dr. Alfredia Ferguson    Location [facility]    Staff Present Carney Living, Cathleen Fears, MS, ACSM-CEP, Exercise Physiologist    Virtual Visit No    Medication changes reported     No    Fall or balance concerns reported    No    Tobacco Cessation No Change    Warm-up and Cool-down Performed as group-led instruction    Resistance Training Performed Yes    VAD Patient? No    PAD/SET Patient? No      Pain Assessment   Currently in Pain? No/denies    Multiple Pain Sites No             Capillary Blood Glucose: No results found for this or any previous visit (from the past 24 hour(s)).    Social History   Tobacco Use  Smoking Status Former   Packs/day: 0.50   Years: 41.00   Total pack years: 20.50   Types: Cigarettes   Quit date: 12/23/2020   Years since quitting: 0.6  Smokeless Tobacco Never    Goals Met:  Independence with exercise equipment Exercise tolerated well No report of concerns or symptoms today Strength training completed today  Goals Unmet:  Not Applicable  Comments: [id number]    Dr. Rodman Pickle is Medical Director for Pulmonary Rehab at [name].

## 2021-08-18 ENCOUNTER — Encounter (HOSPITAL_COMMUNITY)
Admission: RE | Admit: 2021-08-18 | Discharge: 2021-08-18 | Disposition: A | Discharge status: Home / Self Care | Payer: Commercial Managed Care - PPO | Source: Ambulatory Visit | Attending: Emergency Medicine | Admitting: Emergency Medicine

## 2021-08-18 DIAGNOSIS — Z5189 Encounter for other specified aftercare: Secondary | ICD-10-CM | POA: Diagnosis not present

## 2021-08-18 DIAGNOSIS — J432 Centrilobular emphysema: Secondary | ICD-10-CM

## 2021-08-18 NOTE — Progress Notes (Signed)
Daily Session Note  Patient Details  Name: Julie Nicholson MRN: [id number] Date of Birth: [birth year] Referring Provider:   April Manson Pulmonary Rehab Walk Test from 07/24/2021 in [place]  Referring Provider [name]       Encounter Date: 08/18/2021  Check In:  Session Check In - 08/18/21 1117       Check-In   Supervising physician immediately available to respond to emergencies [facility] immediately available    Physician(s) Dr. Alfredia Ferguson    Location [facility]    Staff Present Rosebud Poles, RN, Roque Cash, RN;Other    Virtual Visit No    Medication changes reported     No    Fall or balance concerns reported    No    Tobacco Cessation No Change    Warm-up and Cool-down Performed as group-led instruction    Resistance Training Performed Yes    VAD Patient? No    PAD/SET Patient? No      Pain Assessment   Currently in Pain? No/denies    Multiple Pain Sites No             Capillary Blood Glucose: No results found for this or any previous visit (from the past 24 hour(s)).    Social History   Tobacco Use  Smoking Status Former   Packs/day: 0.50   Years: 41.00   Total pack years: 20.50   Types: Cigarettes   Quit date: 12/23/2020   Years since quitting: 0.6  Smokeless Tobacco Never    Goals Met:  Proper associated with RPD/PD & O2 Sat Exercise tolerated well No report of concerns or symptoms today Strength training completed today  Goals Unmet:  Not Applicable  Comments: Service time is from [place] to [place]    Dr. Rodman Pickle is Medical Director for Pulmonary Rehab at [facility].

## 2021-08-19 NOTE — Progress Notes (Signed)
Pulmonary Individual Treatment Plan  Patient Details  Name: Julie Nicholson MRN: [id number] Date of Birth: 11-30-1965 Referring Provider:   April Manson Pulmonary Rehab Walk Test from 07/24/2021 in [facility]  Referring Provider [name]       Initial Encounter Date:  Flowsheet Row Pulmonary Rehab Walk Test from 07/24/2021 in [facility]  Date 07/24/21       Visit Diagnosis: Centrilobular emphysema ([place])  Patient's Home Medications on Admission:   Current Outpatient Medications:    acetaminophen (TYLENOL) 500 MG tablet, Take 1,000 mg by mouth every 6 (six) hours as needed for moderate pain., Disp: , Rfl:    albuterol (PROVENTIL) (2.5 MG/3ML) 0.083% nebulizer solution, albuterol sulfate 2.5 mg/3 mL (0.083 %) solution for nebulization  USE 1 VIAL IN NEBULIZER EVERY 6 HOURS AS NEEDED FOR WHEEZING OR SHORTNESS OF BREATH, Disp: , Rfl:    albuterol (VENTOLIN HFA) 108 (90 Base) MCG/ACT inhaler, Inhale 1-2 puffs into the lungs every 6 (six) hours as needed for wheezing or shortness of breath., Disp: 6.7 g, Rfl: 5   aspirin EC 81 MG tablet, Take 81 mg by mouth at bedtime., Disp: , Rfl:    atorvastatin (LIPITOR) 40 MG tablet, Take 40 mg by mouth at bedtime., Disp: , Rfl:    benzonatate (TESSALON) 100 MG capsule, Take 1-2 capsules (100-200 mg total) by mouth 3 (three) times daily as needed for cough., Disp: 30 capsule, Rfl: 0   Biotin 5000 MCG TABS, Take 5,000 mcg by mouth at bedtime., Disp: , Rfl:    buPROPion (WELLBUTRIN SR) 150 MG 12 hr tablet, Take 1 tablet by mouth twice daily, Disp: 60 tablet, Rfl: 0   Ca Carbonate-Mag Hydroxide (ROLAIDS PO), Take 1 tablet by mouth daily as needed (heartburn)., Disp: , Rfl:    famotidine (PEPCID) 20 MG tablet, Take 1 tablet by mouth twice daily, Disp: 60 tablet, Rfl: 3   fluticasone (FLONASE) 50 MCG/ACT nasal spray, Place 2 sprays into both nostrils daily as needed for allergies., Disp: , Rfl:     Fluticasone-Umeclidin-Vilant (TRELEGY ELLIPTA) 200-62.5-25 MCG/ACT AEPB, INHALE 1 PUFF ONCE DAILY, Disp: 60 each, Rfl: 2   lidocaine (XYLOCAINE) 2 % solution, Swish/gargle as directed 15 mLs in the mouth or throat every 4 (four) hours as needed for mouth pain (throat pain)., Disp: 100 mL, Rfl: 0   loratadine (CLARITIN) 10 MG tablet, Take 1 tablet (10 mg total) by mouth daily., Disp: 30 tablet, Rfl: 5   Multiple Vitamins-Minerals (HAIR SKIN AND NAILS FORMULA PO), Take 1 tablet by mouth daily., Disp: , Rfl:    ondansetron (ZOFRAN-ODT) 8 MG disintegrating tablet, Take by mouth., Disp: , Rfl:    Oxycodone HCl 10 MG TABS, Take 1 tablet by mouth 3 (three) times daily as needed (pain)., Disp: , Rfl:    pantoprazole (PROTONIX) 40 MG tablet, Take 1 tablet (40 mg total) by mouth daily., Disp: 30 tablet, Rfl: 5   predniSONE (DELTASONE) 10 MG tablet, Please take 5 tablets (50 mg) every morning for 1 week, followed by 4 tablets (40 mg) daily for 1 week, followed by 3 tablets (30 mg) daily for 1 week, followed by 2 tablets (20 mg) daily for 1 week, followed by 1 tablet (10 mg) daily for 1 week, Disp: 105 tablet, Rfl: 0   prochlorperazine (COMPAZINE) 10 MG tablet, Take 1 tablet (10 mg total) by mouth every 6 (six) hours as needed., Disp: 30 tablet, Rfl: 2   sucralfate (CARAFATE)  1 g tablet, Dissolve 1 tablet in 10 mL H20 and swallow 30 min prior to meals and bedtime., Disp: 90 tablet, Rfl: 0   Tiotropium Bromide-Olodaterol (STIOLTO RESPIMAT) 2.5-2.5 MCG/ACT AERS, Inhale 2 puffs into the lungs daily., Disp: 4 g, Rfl: 3   UBRELVY 100 MG TABS, Take 100 mg by mouth daily as needed (migraine)., Disp: , Rfl:   Past Medical History: Past Medical History:  Diagnosis Date   Abdominal discomfort    Cancer ([place])    Chronic headaches    due to allergies, sinus   COPD (chronic obstructive pulmonary disease) ([place])    per 2012 chest xray   pt states she doesn not have this now (04/10/2013)   Deviated nasal septum     Eustachian tube dysfunction    GERD (gastroesophageal reflux disease)    occasional uses Tums / Rolaids   Hearing loss    right ear   High cholesterol    History of radiation therapy    right lung 01/07/2021-02/19/2021  Dr Gery Pray   Migraine    "only once in a blue moon since RX'd allergy shots" (04/10/2013)   Pancreatitis 02/08/2013   Pneumonia    Rhinitis, allergic     Tobacco Use: Social History   Tobacco Use  Smoking Status Former   Packs/day: 0.50   Years: 41.00   Total pack years: 20.50   Types: Cigarettes   Quit date: 12/23/2020   Years since quitting: 0.6  Smokeless Tobacco Never    Labs: Review Flowsheet       Latest Ref Rng & Units 09/11/2010 03/01/2013  Labs for ITP Cardiac and Pulmonary Rehab  Cholestrol 0 - 200 mg/dL - 172   Direct LDL mg/dL - 103.3   HDL-C >39.00 mg/dL - 39.10   Trlycerides 0.0 - 149.0 mg/dL - 216.0   TCO2 0 - 100 mmol/L 25  -    Capillary Blood Glucose: Lab Results  Component Value Date   GLUCAP 102 (H) 12/12/2020     Pulmonary Assessment Scores:  Pulmonary Assessment Scores     Row Name 07/24/21 0933         ADL UCSD   SOB Score total 58       CAT Score   CAT Score 28       mMRC Score   mMRC Score 1             UCSD: Self-administered rating of dyspnea associated with activities of daily living (ADLs) 6-point scale (0 = "not at all" to 5 = "maximal or unable to do because of breathlessness")  Scoring Scores range from 0 to 120.  Minimally important difference is 5 units  CAT: CAT can identify the health impairment of COPD patients and is better correlated with disease progression.  CAT has a scoring range of zero to 40. The CAT score is classified into four groups of low (less than 10), medium (10 - 20), high (21-30) and very high (31-40) based on the impact level of disease on health status. A CAT score over 10 suggests significant symptoms.  A worsening CAT score could be explained by an exacerbation, poor  medication adherence, poor inhaler technique, or progression of COPD or comorbid conditions.  CAT MCID is 2 points  mMRC: mMRC (Modified Medical Research Council) Dyspnea Scale is used to assess the degree of baseline functional disability in patients of respiratory disease due to dyspnea. No minimal important difference is established. A decrease in score of 1 point  or greater is considered a positive change.   Pulmonary Function Assessment:  Pulmonary Function Assessment - 07/24/21 1222       Breath   Bilateral Breath Sounds Clear    Shortness of Breath Yes;Limiting activity;Fear of Shortness of Breath             Exercise Target Goals: Exercise Program Goal: Individual exercise prescription set using results from initial 6 min walk test and THRR while considering  patient's activity barriers and safety.   Exercise Prescription Goal: Initial exercise prescription builds to 30-45 minutes a day of aerobic activity, 2-3 days per week.  Home exercise guidelines will be given to patient during program as part of exercise prescription that the participant will acknowledge.  Activity Barriers & Risk Stratification:  Activity Barriers & Cardiac Risk Stratification - 07/24/21 1123       Activity Barriers & Cardiac Risk Stratification   Activity Barriers Deconditioning;Muscular Weakness;Shortness of Breath             6 Minute Walk:  6 Minute Walk     Row Name 07/24/21 1012         6 Minute Walk   Phase Initial     Distance 1400 feet     Walk Time 6 minutes     # of Rest Breaks 0     MPH 2.65     METS 3.89     RPE 11     Perceived Dyspnea  1     VO2 Peak 13.6     Symptoms No     Resting HR 87 bpm     Resting BP 104/50     Resting Oxygen Saturation  98 %     Exercise Oxygen Saturation  during 6 min walk 96 %     Max Ex. HR 103 bpm     Max Ex. BP 110/64     2 Minute Post BP 106/60       Interval HR   1 Minute HR 95     2 Minute HR 103     3 Minute HR 97      4 Minute HR 97     5 Minute HR 100     6 Minute HR 98     2 Minute Post HR 88     Interval Heart Rate? Yes       Interval Oxygen   Interval Oxygen? Yes     Baseline Oxygen Saturation % 98 %     1 Minute Oxygen Saturation % 98 %     1 Minute Liters of Oxygen 0 L     2 Minute Oxygen Saturation % 96 %     2 Minute Liters of Oxygen 0 L     3 Minute Oxygen Saturation % 98 %     3 Minute Liters of Oxygen 0 L     4 Minute Oxygen Saturation % 93 %     4 Minute Liters of Oxygen 0 L     5 Minute Oxygen Saturation % 98 %     5 Minute Liters of Oxygen 0 L     6 Minute Oxygen Saturation % 98 %     6 Minute Liters of Oxygen 0 L     2 Minute Post Oxygen Saturation % 98 %     2 Minute Post Liters of Oxygen 0 L              Oxygen Initial Assessment:  Oxygen Initial Assessment - 07/24/21 0922       Home Oxygen   Home Oxygen Device None    Sleep Oxygen Prescription None    Home Exercise Oxygen Prescription None    Home Resting Oxygen Prescription None      Initial 6 min Walk   Oxygen Used None      Program Oxygen Prescription   Program Oxygen Prescription None      Intervention   Short Term Goals To learn and exhibit compliance with exercise, home and travel O2 prescription;To learn and understand importance of monitoring SPO2 with pulse oximeter and demonstrate accurate use of the pulse oximeter.;To learn and understand importance of maintaining oxygen saturations>88%;To learn and demonstrate proper pursed lip breathing techniques or other breathing techniques. ;To learn and demonstrate proper use of respiratory medications    Long  Term Goals Exhibits compliance with exercise, home  and travel O2 prescription;Verbalizes importance of monitoring SPO2 with pulse oximeter and return demonstration;Maintenance of O2 saturations>88%;Exhibits proper breathing techniques, such as pursed lip breathing or other method taught during program session;Compliance with respiratory  medication;Demonstrates proper use of MDI's             Oxygen Re-Evaluation:  Oxygen Re-Evaluation     Row Name 08/10/21 0941             Program Oxygen Prescription   Program Oxygen Prescription None         Home Oxygen   Home Oxygen Device None       Sleep Oxygen Prescription None       Home Exercise Oxygen Prescription None       Home Resting Oxygen Prescription None         Goals/Expected Outcomes   Short Term Goals To learn and exhibit compliance with exercise, home and travel O2 prescription;To learn and understand importance of monitoring SPO2 with pulse oximeter and demonstrate accurate use of the pulse oximeter.;To learn and understand importance of maintaining oxygen saturations>88%;To learn and demonstrate proper pursed lip breathing techniques or other breathing techniques. ;To learn and demonstrate proper use of respiratory medications       Long  Term Goals Exhibits compliance with exercise, home  and travel O2 prescription;Verbalizes importance of monitoring SPO2 with pulse oximeter and return demonstration;Maintenance of O2 saturations>88%;Exhibits proper breathing techniques, such as pursed lip breathing or other method taught during program session;Compliance with respiratory medication;Demonstrates proper use of MDI's       Goals/Expected Outcomes Compliance and understanding of oxygen saturation monitoring and breathing techniques to decrease shortness of breath.                Oxygen Discharge (Final Oxygen Re-Evaluation):  Oxygen Re-Evaluation - 08/10/21 0941       Program Oxygen Prescription   Program Oxygen Prescription None      Home Oxygen   Home Oxygen Device None    Sleep Oxygen Prescription None    Home Exercise Oxygen Prescription None    Home Resting Oxygen Prescription None      Goals/Expected Outcomes   Short Term Goals To learn and exhibit compliance with exercise, home and travel O2 prescription;To learn and understand importance  of monitoring SPO2 with pulse oximeter and demonstrate accurate use of the pulse oximeter.;To learn and understand importance of maintaining oxygen saturations>88%;To learn and demonstrate proper pursed lip breathing techniques or other breathing techniques. ;To learn and demonstrate proper use of respiratory medications    Long  Term Goals Exhibits compliance with exercise,  home  and travel O2 prescription;Verbalizes importance of monitoring SPO2 with pulse oximeter and return demonstration;Maintenance of O2 saturations>88%;Exhibits proper breathing techniques, such as pursed lip breathing or other method taught during program session;Compliance with respiratory medication;Demonstrates proper use of MDI's    Goals/Expected Outcomes Compliance and understanding of oxygen saturation monitoring and breathing techniques to decrease shortness of breath.             Initial Exercise Prescription:  Initial Exercise Prescription - 07/24/21 1000       Date of Initial Exercise RX and Referring Provider   Date 07/24/21    Referring Provider [name]    Expected Discharge Date 10/01/21      NuStep   Level 1    SPM 70    Minutes 15      Track   Minutes 15    METs 3.89      Prescription Details   Frequency (times per week) 2    Duration Progress to 30 minutes of continuous aerobic without signs/symptoms of physical distress      Intensity   THRR 40-80% of Max Heartrate 65-131    Ratings of Perceived Exertion 11-13    Perceived Dyspnea 0-4      Progression   Progression Continue to progress workloads to maintain intensity without signs/symptoms of physical distress.      Resistance Training   Training Prescription Yes    Weight red bands    Reps 10-15             Perform Capillary Blood Glucose checks as needed.  Exercise Prescription Changes:   Exercise Prescription Changes     Row Name 08/11/21 1200             Response to Exercise   Blood Pressure (Admit) 122/60        Blood Pressure (Exercise) 128/66       Blood Pressure (Exit) 102/64       Heart Rate (Admit) 95 bpm       Heart Rate (Exercise) 130 bpm       Heart Rate (Exit) 92 bpm       Oxygen Saturation (Admit) 100 %       Oxygen Saturation (Exercise) 95 %       Oxygen Saturation (Exit) 98 %       Rating of Perceived Exertion (Exercise) 13       Perceived Dyspnea (Exercise) 1       Intensity --  40-80 % HRR         Resistance Training   Training Prescription Yes       Weight Blue bands       Reps 10-15       Time 10 Minutes         Treadmill   MPH 2.1       Grade 0       Minutes 15       METs 2.61         NuStep   Level 2       SPM 70       Minutes 15       METs 2.8                Exercise Comments:   Exercise Comments     Row Name 07/30/21 1500           Exercise Comments Pt completed first day of exercise. She exercised 15 min on the track and Nustep.  Julie Nicholson averaged 2.51 METs on the track and 2.0 METs at level 2 on the Nustep. Julie Nicholson performed the warmup and cooldown standing without limitations.                Exercise Goals and Review:   Exercise Goals     Row Name 07/24/21 0925 08/10/21 0917           Exercise Goals   Increase Physical Activity Yes Yes      Intervention Provide advice, education, support and counseling about physical activity/exercise needs.;Develop an individualized exercise prescription for aerobic and resistive training based on initial evaluation findings, risk stratification, comorbidities and participant's personal goals. Provide advice, education, support and counseling about physical activity/exercise needs.;Develop an individualized exercise prescription for aerobic and resistive training based on initial evaluation findings, risk stratification, comorbidities and participant's personal goals.      Expected Outcomes Short Term: Attend rehab on a regular basis to increase amount of physical activity.;Long Term: Add in home exercise to  make exercise part of routine and to increase amount of physical activity.;Long Term: Exercising regularly at least 3-5 days a week. Short Term: Attend rehab on a regular basis to increase amount of physical activity.;Long Term: Add in home exercise to make exercise part of routine and to increase amount of physical activity.;Long Term: Exercising regularly at least 3-5 days a week.      Increase Strength and Stamina Yes Yes      Intervention Provide advice, education, support and counseling about physical activity/exercise needs.;Develop an individualized exercise prescription for aerobic and resistive training based on initial evaluation findings, risk stratification, comorbidities and participant's personal goals. Provide advice, education, support and counseling about physical activity/exercise needs.;Develop an individualized exercise prescription for aerobic and resistive training based on initial evaluation findings, risk stratification, comorbidities and participant's personal goals.      Expected Outcomes Short Term: Increase workloads from initial exercise prescription for resistance, speed, and METs.;Short Term: Perform resistance training exercises routinely during rehab and add in resistance training at home;Long Term: Improve cardiorespiratory fitness, muscular endurance and strength as measured by increased METs and functional capacity (6MWT) Short Term: Increase workloads from initial exercise prescription for resistance, speed, and METs.;Short Term: Perform resistance training exercises routinely during rehab and add in resistance training at home;Long Term: Improve cardiorespiratory fitness, muscular endurance and strength as measured by increased METs and functional capacity (6MWT)      Able to understand and use rate of perceived exertion (RPE) scale Yes Yes      Intervention Provide education and explanation on how to use RPE scale Provide education and explanation on how to use RPE scale       Expected Outcomes Short Term: Able to use RPE daily in rehab to express subjective intensity level;Long Term:  Able to use RPE to guide intensity level when exercising independently Short Term: Able to use RPE daily in rehab to express subjective intensity level;Long Term:  Able to use RPE to guide intensity level when exercising independently      Able to understand and use Dyspnea scale Yes Yes      Intervention Provide education and explanation on how to use Dyspnea scale Provide education and explanation on how to use Dyspnea scale      Expected Outcomes Short Term: Able to use Dyspnea scale daily in rehab to express subjective sense of shortness of breath during exertion;Long Term: Able to use Dyspnea scale to guide intensity level when exercising independently Short Term: Able  to use Dyspnea scale daily in rehab to express subjective sense of shortness of breath during exertion;Long Term: Able to use Dyspnea scale to guide intensity level when exercising independently      Knowledge and understanding of Target Heart Rate Range (THRR) Yes Yes      Intervention Provide education and explanation of THRR including how the numbers were predicted and where they are located for reference Provide education and explanation of THRR including how the numbers were predicted and where they are located for reference      Expected Outcomes Short Term: Able to state/look up THRR;Long Term: Able to use THRR to govern intensity when exercising independently;Short Term: Able to use daily as guideline for intensity in rehab Short Term: Able to state/look up THRR;Long Term: Able to use THRR to govern intensity when exercising independently;Short Term: Able to use daily as guideline for intensity in rehab      Understanding of Exercise Prescription Yes Yes      Intervention Provide education, explanation, and written materials on patient's individual exercise prescription Provide education, explanation, and written  materials on patient's individual exercise prescription      Expected Outcomes Short Term: Able to explain program exercise prescription;Long Term: Able to explain home exercise prescription to exercise independently Short Term: Able to explain program exercise prescription;Long Term: Able to explain home exercise prescription to exercise independently               Exercise Goals Re-Evaluation :  Exercise Goals Re-Evaluation     Row Name 08/10/21 0917             Exercise Goal Re-Evaluation   Exercise Goals Review Increase Physical Activity;Increase Strength and Stamina;Able to understand and use rate of perceived exertion (RPE) scale;Able to understand and use Dyspnea scale;Knowledge and understanding of Target Heart Rate Range (THRR);Understanding of Exercise Prescription       Comments Julie Nicholson has completed 2 exercise sessions. She exercises for 15 min on the treadmill and Nustep. Julie Nicholson averages 2.45 METs at 1.9 mph and 0% incline on the treadmill and 2.2 METs at level 2 on the Nustep. Julie Nicholson has been progressed from the track to the treadmill as she tolerates this fair. She has not walked on a treadmill, so there is a learning curve. We will still work on proper gait when walking on the treadmill. Julie Nicholson performs the warmup and cooldown standing without limitations. She is motivated to exercise and improve her functional capacity. Will continue to monitor and progress as able.       Expected Outcomes Through exercise at rehab and home, the patient will decrease shortness of breath with daily activities and feel confident in carrying out an exercise regimen at home.                Discharge Exercise Prescription (Final Exercise Prescription Changes):  Exercise Prescription Changes - 08/11/21 1200       Response to Exercise   Blood Pressure (Admit) 122/60    Blood Pressure (Exercise) 128/66    Blood Pressure (Exit) 102/64    Heart Rate (Admit) 95 bpm    Heart Rate (Exercise) 130 bpm     Heart Rate (Exit) 92 bpm    Oxygen Saturation (Admit) 100 %    Oxygen Saturation (Exercise) 95 %    Oxygen Saturation (Exit) 98 %    Rating of Perceived Exertion (Exercise) 13    Perceived Dyspnea (Exercise) 1    Intensity --   40-80 %  HRR     Resistance Training   Training Prescription Yes    Weight Blue bands    Reps 10-15    Time 10 Minutes      Treadmill   MPH 2.1    Grade 0    Minutes 15    METs 2.61      NuStep   Level 2    SPM 70    Minutes 15    METs 2.8             Nutrition:  Target Goals: Understanding of nutrition guidelines, daily intake of sodium <1553m, cholesterol <206m calories 30% from fat and 7% or less from saturated fats, daily to have 5 or more servings of fruits and vegetables.  Biometrics:   Post Biometrics - 07/24/21 1016        Post  Biometrics   Grip Strength 17 kg             Nutrition Therapy Plan and Nutrition Goals:  Nutrition Therapy & Goals - 08/11/21 1109       Nutrition Therapy   Diet Heart healthy diet    Drug/Food Interactions Statins/Certain Fruits      Personal Nutrition Goals   Nutrition Goal Patient to choose a daily variety of fruits, vegetables, whole grains, lean protein/plant protein, and dairy products as part of heart healthy diet.    Personal Goal #2 Patient to understand strategies for weight gain of 0.5-2.0# per week.    Comments Goals in progress. Patient continues 2 Ensure daily (440 calories, 18g protein) to aid with weight gain/weight maintenance. Weight stable; goal weight 128#.      Intervention Plan   Intervention Prescribe, educate and counsel regarding individualized specific dietary modifications aiming towards targeted core components such as weight, hypertension, lipid management, diabetes, heart failure and other comorbidities.    Expected Outcomes Short Term Goal: Understand basic principles of dietary content, such as calories, fat, sodium, cholesterol and nutrients.;Long Term Goal:  Adherence to prescribed nutrition plan.             Nutrition Assessments:  MEDIFICTS Score Key: ?70 Need to make dietary changes  40-70 Heart Healthy Diet ? 40 Therapeutic Level Cholesterol Diet   Picture Your Plate Scores: <4<87nhealthy dietary pattern with much room for improvement. 41-50 Dietary pattern unlikely to meet recommendations for good health and room for improvement. 51-60 More healthful dietary pattern, with some room for improvement.  >60 Healthy dietary pattern, although there may be some specific behaviors that could be improved.    Nutrition Goals Re-Evaluation:  Nutrition Goals Re-Evaluation     [place] 07/30/21 1104 08/11/21 1109           Goals   Current Weight 114 lb 10.2 oz (52 kg) 116 lb 2.9 oz (52.7 kg)      Comment Patient reports her typical weight is 127-128#; she got down as low as 98# when diagnosed with cancer. She reports weight is appropriately trending upward. --      Expected Outcome -- Goals in progress. Patient continues 2 Ensure daily (440 calories, 18g protein) to aid with weight gain/weight maintenance. Weight stable; goal weight 128#.               Nutrition Goals Discharge (Final Nutrition Goals Re-Evaluation):  Nutrition Goals Re-Evaluation - 08/11/21 1109       Goals   Current Weight 116 lb 2.9 oz (52.7 kg)    Expected Outcome Goals in progress. Patient continues  2 Ensure daily (440 calories, 18g protein) to aid with weight gain/weight maintenance. Weight stable; goal weight 128#.             Psychosocial: Target Goals: Acknowledge presence or absence of significant depression and/or stress, maximize coping skills, provide positive support system. Participant is able to verbalize types and ability to use techniques and skills needed for reducing stress and depression.  Initial Review & Psychosocial Screening:  Initial Psych Review & Screening - 07/24/21 0912       Initial Review   Current issues with None  Identified      Family Dynamics   Good Support System? Yes    Comments daughter, mother, son      Barriers   Psychosocial barriers to participate in program There are no identifiable barriers or psychosocial needs.      Screening Interventions   Interventions Encouraged to exercise             Quality of Life Scores:  Scores of 19 and below usually indicate a poorer quality of life in these areas.  A difference of  2-3 points is a clinically meaningful difference.  A difference of 2-3 points in the total score of the Quality of Life Index has been associated with significant improvement in overall quality of life, self-image, physical symptoms, and general health in studies assessing change in quality of life.  PHQ-9: Review Flowsheet       07/24/2021  Depression screen PHQ 2/9  Decreased Interest 0  Down, Depressed, Hopeless 0  PHQ - 2 Score 0  Altered sleeping 0  Tired, decreased energy 0  Change in appetite 0  Feeling bad or failure about yourself  0  Trouble concentrating 0  Moving slowly or fidgety/restless 0  Suicidal thoughts 0  PHQ-9 Score 0   Interpretation of Total Score  Total Score Depression Severity:  1-4 = Minimal depression, 5-9 = Mild depression, 10-14 = Moderate depression, 15-19 = Moderately severe depression, 20-27 = Severe depression   Psychosocial Evaluation and Intervention:  Psychosocial Evaluation - 07/24/21 0915       Psychosocial Evaluation & Interventions   Interventions Encouraged to exercise with the program and follow exercise prescription    Expected Outcomes For Julie Nicholson to participate in [facility] rehab    Continue Psychosocial Services  Follow up required by staff             Psychosocial Re-Evaluation:  Psychosocial Re-Evaluation     [place] Name 08/12/21 1030             Psychosocial Re-Evaluation   Current issues with None Identified       Comments Julie Nicholson has been able to participate in the PR program without any  psychosocial barriers.She has attended 3 exercise sessions.       Expected Outcomes For her to continue to attend PR without any psychosocial issues or concerns.       Interventions Encouraged to attend Pulmonary Rehabilitation for the exercise       Continue Psychosocial Services  Follow up required by staff                Psychosocial Discharge (Final Psychosocial Re-Evaluation):  Psychosocial Re-Evaluation - 08/12/21 1030       Psychosocial Re-Evaluation   Current issues with None Identified    Comments Julie Nicholson has been able to participate in the PR program without any psychosocial barriers.She has attended 3 exercise sessions.    Expected Outcomes For her to continue  to attend PR without any psychosocial issues or concerns.    Interventions Encouraged to attend Pulmonary Rehabilitation for the exercise    Continue Psychosocial Services  Follow up required by staff             Education: Education Goals: Education classes will be provided on a weekly basis, covering required topics. Participant will state understanding/return demonstration of topics presented.  Learning Barriers/Preferences:  Learning Barriers/Preferences - 07/24/21 8527       Learning Barriers/Preferences   Learning Barriers Sight   wears glasses   Learning Preferences Video;Verbal Instruction;Skilled Demonstration;Pictoral;Individual Instruction;Group Instruction;Computer/Internet;Audio             Education Topics: Risk Factor Reduction:  -Group instruction that is supported by a PowerPoint presentation. Instructor discusses the definition of a risk factor, different risk factors for pulmonary disease, and how the heart and lungs work together.     Nutrition for Pulmonary Patient:  -Group instruction provided by PowerPoint slides, verbal discussion, and written materials to support subject matter. The instructor gives an explanation and review of healthy diet recommendations, which includes a  discussion on weight management, recommendations for fruit and vegetable consumption, as well as protein, fluid, caffeine, fiber, sodium, sugar, and alcohol. Tips for eating when patients are short of breath are discussed.   Pursed Lip Breathing:  -Group instruction that is supported by demonstration and informational handouts. Instructor discusses the benefits of pursed lip and diaphragmatic breathing and detailed demonstration on how to preform both.   Flowsheet Row PULMONARY REHAB OTHER RESPIRATORY from 08/13/2021 in [place]  Date 08/06/21  Educator Donnetta Simpers  [Handout]  Instruction Review Code 1- Verbalizes Understanding       Oxygen Safety:  -Group instruction provided by PowerPoint, verbal discussion, and written material to support subject matter. There is an overview of "What is Oxygen" and "Why do we need it".  Instructor also reviews how to create a safe environment for oxygen use, the importance of using oxygen as prescribed, and the risks of noncompliance. There is a brief discussion on traveling with oxygen and resources the patient may utilize.   Oxygen Equipment:  -Group instruction provided by [facility] Staff utilizing handouts, written materials, and equipment demonstrations.   Signs and Symptoms:  -Group instruction provided by written material and verbal discussion to support subject matter. Warning signs and symptoms of infection, stroke, and heart attack are reviewed and when to call the physician/911 reinforced. Tips for preventing the spread of infection discussed.   Advanced Directives:  -Group instruction provided by verbal instruction and written material to support subject matter. Instructor reviews Advanced Directive laws and proper instruction for filling out document.   Pulmonary Video:  -Group video education that reviews the importance of medication and oxygen compliance, exercise, good nutrition, pulmonary hygiene, and  pursed lip and diaphragmatic breathing for the pulmonary patient.   Exercise for the Pulmonary Patient:  -Group instruction that is supported by a PowerPoint presentation. Instructor discusses benefits of exercise, core components of exercise, frequency, duration, and intensity of an exercise routine, importance of utilizing pulse oximetry during exercise, safety while exercising, and options of places to exercise outside of rehab.     Pulmonary Medications:  -Verbally interactive group education provided by instructor with focus on inhaled medications and proper administration.   Anatomy and Physiology of the Respiratory System and Intimacy:  -Group instruction provided by PowerPoint, verbal discussion, and written material to support subject matter. Instructor reviews respiratory cycle and  anatomical components of the respiratory system and their functions. Instructor also reviews differences in obstructive and restrictive respiratory diseases with examples of each. Intimacy, Sex, and Sexuality differences are reviewed with a discussion on how relationships can change when diagnosed with pulmonary disease. Common sexual concerns are reviewed.   MD DAY -A group question and answer session with a medical doctor that allows participants to ask questions that relate to their pulmonary disease state.   OTHER EDUCATION -Group or individual verbal, written, or video instructions that support the educational goals of the pulmonary rehab program. [place] from 08/13/2021 in [place]  Date 08/13/21  Educator Kelle Darting  Patrice [place] to PR]  Instruction Review Code 1- Verbalizes Understanding       Holiday Eating Survival Tips:  -Group instruction provided by PowerPoint slides, verbal discussion, and written materials to support subject matter. The instructor gives patients tips, tricks, and techniques to help them not only survive  but enjoy the holidays despite the onslaught of food that accompanies the holidays.   Knowledge Questionnaire Score:  Knowledge Questionnaire Score - 07/24/21 0934       Knowledge Questionnaire Score   Pre Score 14/18             Core Components/Risk Factors/Patient Goals at Admission:  Personal Goals and Risk Factors at Admission - 07/24/21 0917       Core Components/Risk Factors/Patient Goals on Admission   Improve shortness of breath with ADL's Yes    Intervention Provide education, individualized exercise plan and daily activity instruction to help decrease symptoms of SOB with activities of daily living.    Expected Outcomes Short Term: Improve cardiorespiratory fitness to achieve a reduction of symptoms when performing ADLs;Long Term: Be able to perform more ADLs without symptoms or delay the onset of symptoms             Core Components/Risk Factors/Patient Goals Review:   Goals and Risk Factor Review     Row Name 08/12/21 1036             Core Components/Risk Factors/Patient Goals Review   Personal Goals Review Improve shortness of breath with ADL's;Develop more efficient breathing techniques such as purse lipped breathing and diaphragmatic breathing and practicing self-pacing with activity.;Increase knowledge of respiratory medications and ability to use respiratory devices properly.       Review Julie Nicholson is progressing well in the PR program.She is exercisng on the treadmill and nu-step. She started out on the track but has progressed to the treadmill.Julie Nicholson seems to be enjoying the PR program. She is increasing her workloads and METS.She is motivated to exercise and push herself to improve.She has been exercising on room air with saturations of 95-99%.We had breathing techniques education since she has been attending.       Expected Outcomes See admission goals.For her to continue to improve her METS and workloads.                Core Components/Risk  Factors/Patient Goals at Discharge (Final Review):   Goals and Risk Factor Review - 08/12/21 1036       Core Components/Risk Factors/Patient Goals Review   Personal Goals Review Improve shortness of breath with ADL's;Develop more efficient breathing techniques such as purse lipped breathing and diaphragmatic breathing and practicing self-pacing with activity.;Increase knowledge of respiratory medications and ability to use respiratory devices properly.    Review Julie Nicholson is progressing well in the PR program.She is exercisng on  the treadmill and nu-step. She started out on the track but has progressed to the treadmill.Julie Nicholson seems to be enjoying the PR program. She is increasing her workloads and METS.She is motivated to exercise and push herself to improve.She has been exercising on room air with saturations of 95-99%.We had breathing techniques education since she has been attending.    Expected Outcomes See admission goals.For her to continue to improve her METS and workloads.             ITP Comments:  Julie Nicholson has completed 5 exercise session in Pulmonary rehab. Pt maintains good attendance and consistent home exercise. Pulmonary rehab staff will continue to monitor and reassess progress toward goals during her participation in Pulmonary Rehab.    Comments: Dr. Rodman Pickle is Medical Director for Pulmonary Rehab at [facility].

## 2021-08-20 ENCOUNTER — Telehealth: Payer: Self-pay | Admitting: Emergency Medicine

## 2021-08-20 ENCOUNTER — Encounter (HOSPITAL_COMMUNITY)
Admission: RE | Admit: 2021-08-20 | Discharge: 2021-08-20 | Disposition: A | Discharge status: Home / Self Care | Payer: Commercial Managed Care - PPO | Source: Ambulatory Visit | Attending: Emergency Medicine | Admitting: Emergency Medicine

## 2021-08-20 DIAGNOSIS — Z5189 Encounter for other specified aftercare: Secondary | ICD-10-CM | POA: Diagnosis not present

## 2021-08-20 DIAGNOSIS — J432 Centrilobular emphysema: Secondary | ICD-10-CM

## 2021-08-20 NOTE — Progress Notes (Signed)
Daily Session Note  Patient Details  Name: Julie Nicholson MRN: [id number] Date of Birth: Jul 21, 1965 Referring Provider:   April Manson Pulmonary Rehab Walk Test from 07/24/2021 in [place]  Referring Provider [name]       Encounter Date: 08/20/2021  Check In:  Session Check In - 08/20/21 1129       Check-In   Supervising physician immediately available to respond to emergencies [facility] immediately available    Physician(s) Dr. Horris Latino    Location [facility]    Staff Present Carney Living, RN;Jetta Walker BS, ACSM EP-C, Exercise Physiologist    Virtual Visit No    Medication changes reported     No    Fall or balance concerns reported    No    Tobacco Cessation No Change    Warm-up and Cool-down Performed as group-led instruction    Resistance Training Performed Yes    VAD Patient? No    PAD/SET Patient? No      Pain Assessment   Currently in Pain? No/denies    Multiple Pain Sites No             Capillary Blood Glucose: No results found for this or any previous visit (from the past 24 hour(s)).    Social History   Tobacco Use  Smoking Status Former   Packs/day: 0.50   Years: 41.00   Total pack years: 20.50   Types: Cigarettes   Quit date: 12/23/2020   Years since quitting: 0.6  Smokeless Tobacco Never    Goals Met:  Proper associated with RPD/PD & O2 Sat Exercise tolerated well No report of concerns or symptoms today Strength training completed today  Goals Unmet:  Not Applicable  Comments: Service time is from 1022 to [place]    Dr. Rodman Pickle is Medical Director for Pulmonary Rehab at [facility].

## 2021-08-20 NOTE — Telephone Encounter (Signed)
Received a fax on 7/12 from [employer] for disability for patient - sent to Dr. Lamonte Sakai to complete the questions regarding patient's ability to perform work tasks.

## 2021-08-24 ENCOUNTER — Other Ambulatory Visit: Payer: Self-pay

## 2021-08-25 ENCOUNTER — Encounter (HOSPITAL_COMMUNITY): Payer: Commercial Managed Care - PPO

## 2021-08-27 ENCOUNTER — Encounter (HOSPITAL_COMMUNITY): Payer: Commercial Managed Care - PPO

## 2021-08-28 ENCOUNTER — Other Ambulatory Visit: Payer: Self-pay | Admitting: Emergency Medicine

## 2021-08-30 ENCOUNTER — Other Ambulatory Visit: Payer: Self-pay | Admitting: Nurse Practitioner

## 2021-08-30 DIAGNOSIS — J3089 Other allergic rhinitis: Secondary | ICD-10-CM

## 2021-09-01 ENCOUNTER — Encounter (HOSPITAL_COMMUNITY): Payer: Commercial Managed Care - PPO

## 2021-09-02 ENCOUNTER — Telehealth (HOSPITAL_COMMUNITY): Payer: Self-pay | Admitting: *Deleted

## 2021-09-02 ENCOUNTER — Telehealth (HOSPITAL_COMMUNITY): Payer: Self-pay

## 2021-09-02 NOTE — Telephone Encounter (Signed)
Kim returned our call. She states that she did not return from vacation until Tuesday.She plans to return to PR on Thursday 09/03/21.

## 2021-09-02 NOTE — Telephone Encounter (Signed)
Left a voicemail checking in on Julie Nicholson and seeing when she was coming back to Pulmonary Rehab

## 2021-09-03 ENCOUNTER — Encounter (HOSPITAL_COMMUNITY)
Admission: RE | Admit: 2021-09-03 | Discharge: 2021-09-03 | Disposition: A | Discharge status: Home / Self Care | Payer: Commercial Managed Care - PPO | Source: Ambulatory Visit | Attending: Emergency Medicine | Admitting: Emergency Medicine

## 2021-09-03 DIAGNOSIS — C3411 Malignant neoplasm of upper lobe, right bronchus or lung: Secondary | ICD-10-CM | POA: Diagnosis not present

## 2021-09-03 DIAGNOSIS — J432 Centrilobular emphysema: Secondary | ICD-10-CM

## 2021-09-03 DIAGNOSIS — J189 Pneumonia, unspecified organism: Secondary | ICD-10-CM | POA: Diagnosis not present

## 2021-09-03 NOTE — Progress Notes (Signed)
Daily Session Note  Patient Details  Name: Julie Nicholson MRN: [id number] Date of Birth: Aug 01, 1965 Referring Provider:   April Manson Pulmonary Rehab Walk Test from 07/24/2021 in [place]  Referring Provider [name]       Encounter Date: 09/03/2021  Check In:  Session Check In - 09/03/21 1124       Check-In   Supervising physician immediately available to respond to emergencies [facility] immediately available    Physician(s) Dr. [place]    Location [facility]    Staff Present Rosebud Poles, RN, BSN;Randi Olen Cordial BS, ACSM-CEP, Exercise Physiologist;Kaylee Rosana Hoes, MS, ACSM-CEP, Exercise Physiologist;Lisa Ysidro Evert, RN    Virtual Visit No    Medication changes reported     No    Fall or balance concerns reported    No    Tobacco Cessation No Change    Warm-up and Cool-down Performed as group-led instruction    Resistance Training Performed Yes    VAD Patient? No    PAD/SET Patient? No      Pain Assessment   Currently in Pain? No/denies    Multiple Pain Sites No             Capillary Blood Glucose: No results found for this or any previous visit (from the past 24 hour(s)).    Social History   Tobacco Use  Smoking Status Former   Packs/day: 0.50   Years: 41.00   Total pack years: 20.50   Types: Cigarettes   Quit date: 12/23/2020   Years since quitting: 0.6  Smokeless Tobacco Never    Goals Met:  Proper associated with RPD/PD & O2 Sat Exercise tolerated well No report of concerns or symptoms today Strength training completed today  Goals Unmet:  Not Applicable  Comments: Service time is from 1019 to 1150.    Dr. Rodman Pickle is Medical Director for Pulmonary Rehab at [facility].

## 2021-09-04 ENCOUNTER — Other Ambulatory Visit: Payer: Self-pay | Admitting: Emergency Medicine

## 2021-09-04 ENCOUNTER — Other Ambulatory Visit: Payer: Self-pay

## 2021-09-07 ENCOUNTER — Ambulatory Visit (HOSPITAL_COMMUNITY)
Admission: RE | Admit: 2021-09-07 | Discharge: 2021-09-07 | Disposition: A | Discharge status: Home / Self Care | Payer: Commercial Managed Care - PPO | Source: Ambulatory Visit | Attending: Physician Assistant | Admitting: Physician Assistant

## 2021-09-07 ENCOUNTER — Inpatient Hospital Stay: Payer: Commercial Managed Care - PPO | Attending: Internal Medicine

## 2021-09-07 ENCOUNTER — Other Ambulatory Visit: Payer: Self-pay

## 2021-09-07 DIAGNOSIS — J189 Pneumonia, unspecified organism: Secondary | ICD-10-CM | POA: Insufficient documentation

## 2021-09-07 DIAGNOSIS — C3411 Malignant neoplasm of upper lobe, right bronchus or lung: Secondary | ICD-10-CM | POA: Insufficient documentation

## 2021-09-07 LAB — CMP (CANCER CENTER ONLY)
ALT: 17 U/L (ref 0–44)
AST: 14 U/L — ABNORMAL LOW (ref 15–41)
Albumin: 4.7 g/dL (ref 3.5–5.0)
Alkaline Phosphatase: 119 U/L (ref 38–126)
Anion gap: 7 (ref 5–15)
BUN: 14 mg/dL (ref 6–20)
CO2: 30 mmol/L (ref 22–32)
Calcium: 9.8 mg/dL (ref 8.9–10.3)
Chloride: 104 mmol/L (ref 98–111)
Creatinine: 0.8 mg/dL (ref 0.44–1.00)
GFR, Estimated: >60 mL/min (ref >60)
Glucose, Bld: 102 mg/dL — ABNORMAL HIGH (ref 70–99)
Potassium: 3.2 mmol/L — ABNORMAL LOW (ref 3.5–5.1)
Sodium: 141 mmol/L (ref 135–145)
Total Bilirubin: 0.6 mg/dL (ref 0.3–1.2)
Total Protein: 8.3 g/dL — ABNORMAL HIGH (ref 6.5–8.1)

## 2021-09-07 LAB — CBC WITH DIFFERENTIAL (CANCER CENTER ONLY)
Abs Immature Granulocytes: 0.03 10*3/uL (ref 0.00–0.07)
Basophils Absolute: 0 10*3/uL (ref 0.0–0.1)
Basophils Relative: 0 %
Eosinophils Absolute: 0 10*3/uL (ref 0.0–0.5)
Eosinophils Relative: 0 %
HCT: 41.5 % (ref 36.0–46.0)
Hemoglobin: 14.5 g/dL (ref 12.0–15.0)
Immature Granulocytes: 0 %
Lymphocytes Relative: 9 %
Lymphs Abs: 1 10*3/uL (ref 0.7–4.0)
MCH: 33.1 pg (ref 26.0–34.0)
MCHC: 34.9 g/dL (ref 30.0–36.0)
MCV: 94.7 fL (ref 80.0–100.0)
Monocytes Absolute: 0.8 10*3/uL (ref 0.1–1.0)
Monocytes Relative: 7 %
Neutro Abs: 9 10*3/uL — ABNORMAL HIGH (ref 1.7–7.7)
Neutrophils Relative %: 84 %
Platelet Count: 244 10*3/uL (ref 150–400)
RBC: 4.38 MIL/uL (ref 3.87–5.11)
RDW: 13.2 % (ref 11.5–15.5)
WBC Count: 10.8 10*3/uL — ABNORMAL HIGH (ref 4.0–10.5)
nRBC: 0 % (ref 0.0–0.2)

## 2021-09-07 LAB — TSH: TSH: 1.444 u[IU]/mL (ref 0.350–4.500)

## 2021-09-07 MED ORDER — SODIUM CHLORIDE (PF) 0.9 % IJ SOLN
INTRAMUSCULAR | Status: AC
  Filled 2021-09-07: qty 50

## 2021-09-07 MED ORDER — IOHEXOL 300 MG/ML  SOLN
80.0000 mL | Freq: Once | INTRAMUSCULAR | Status: AC | PRN
  Administered 2021-09-07: 75 mL via INTRAVENOUS

## 2021-09-07 NOTE — Telephone Encounter (Signed)
Dr. Lamonte Sakai completed questionnaire on patient work capacity and I faxed to [facility] - fax# [phone].   Included copy of 08/12/21 office notes and mailed hard copy to patient.

## 2021-09-08 ENCOUNTER — Encounter (HOSPITAL_COMMUNITY)
Admission: RE | Admit: 2021-09-08 | Discharge: 2021-09-08 | Disposition: A | Discharge status: Home / Self Care | Payer: Commercial Managed Care - PPO | Source: Ambulatory Visit | Attending: Emergency Medicine | Admitting: Emergency Medicine

## 2021-09-08 VITALS — Wt 113.8 lb

## 2021-09-08 DIAGNOSIS — J432 Centrilobular emphysema: Secondary | ICD-10-CM

## 2021-09-08 DIAGNOSIS — C3411 Malignant neoplasm of upper lobe, right bronchus or lung: Secondary | ICD-10-CM | POA: Diagnosis not present

## 2021-09-08 NOTE — Progress Notes (Signed)
Home Exercise Prescription I have reviewed a Home Exercise Prescription with Julie Nicholson. Completed home exercise plan with pt. She is already walking 30-60 min 3 nonrehab days a week. Encouraged her to continue. I am confident she will. she is thinking of joining an exercise facility after pulmonary rehab.The patient stated that their goals were to to continue exercising. We reviewed exercise guidelines, target heart rate during exercise, RPE Scale, weather conditions, endpoints for exercise, warmup and cool down. The patient is encouraged to come to me with any questions. I will continue to follow up with the patient to assist them with progression and safety.    Julie Nicholson, [place], ACSM-CEP 09/08/2021 12:19 PM

## 2021-09-08 NOTE — Progress Notes (Signed)
Daily Session Note  Patient Details  Name: Julie Nicholson MRN: [id number] Date of Birth: 23-Jul-1965 Referring Provider:   April Manson Pulmonary Rehab Walk Test from 07/24/2021 in [place]  Referring Provider [name]       Encounter Date: 09/08/2021  Check In:  Session Check In - 09/08/21 1201       Check-In   Supervising physician immediately available to respond to emergencies [facility] immediately available    Physician(s) Dr. [place]    [facility]    Staff Present Elmon Else, MS, ACSM-CEP, Exercise Physiologist;Other;Randi Yevonne Pax, ACSM-CEP, Exercise Physiologist;Lisa Vicente Serene, MS, ACSM-CEP, Exercise Physiologist    Virtual Visit No    Medication changes reported     No    Fall or balance concerns reported    No    Tobacco Cessation No Change    Warm-up and Cool-down Performed as group-led instruction    Resistance Training Performed Yes    VAD Patient? No      Pain Assessment   Currently in Pain? No/denies    Multiple Pain Sites No             Capillary Blood Glucose: No results found for this or any previous visit (from the past 24 hour(s)).   Exercise Prescription Changes - 09/08/21 1200       Response to Exercise   Blood Pressure (Admit) 100/58    Blood Pressure (Exercise) 118/62    Blood Pressure (Exit) 108/70    Heart Rate (Admit) 94 bpm    Heart Rate (Exercise) 106 bpm    Heart Rate (Exit) 86 bpm    Oxygen Saturation (Admit) 98 %    Oxygen Saturation (Exercise) 95 %    Oxygen Saturation (Exit) 98 %    Rating of Perceived Exertion (Exercise) 11    Perceived Dyspnea (Exercise) 1    Duration Continue with 30 min of aerobic exercise without signs/symptoms of physical distress.    Intensity THRR unchanged      Progression   Progression Continue to progress workloads to maintain intensity without signs/symptoms of physical distress.      Resistance Training    Training Prescription Yes    Weight blue bands    Reps 10-15    Time 10 Minutes      Treadmill   MPH 2.1    Grade 0    Minutes 15    METs 2.61      NuStep   Level 4    SPM 80    Minutes 15    METs 3.4      Home Exercise Plan   Plans to continue exercise at Home (comment)   walking at home   Frequency --   n/a   Initial Home Exercises Provided 09/08/21             Social History   Tobacco Use  Smoking Status Former   Packs/day: 0.50   Years: 41.00   Total pack years: 20.50   Types: Cigarettes   Quit date: 12/23/2020   Years since quitting: 0.7  Smokeless Tobacco Never    Goals Met:  Proper associated with RPD/PD & O2 Sat Exercise tolerated well No report of concerns or symptoms today Strength training completed today  Goals Unmet:  Not Applicable  Comments: Service time is from 1014 to 1140.    Dr. Rodman Pickle is Medical Director for Pulmonary Rehab at [facility].

## 2021-09-09 ENCOUNTER — Inpatient Hospital Stay (HOSPITAL_BASED_OUTPATIENT_CLINIC_OR_DEPARTMENT_OTHER): Payer: Commercial Managed Care - PPO | Admitting: Internal Medicine

## 2021-09-09 ENCOUNTER — Encounter: Payer: Self-pay | Admitting: Internal Medicine

## 2021-09-09 ENCOUNTER — Other Ambulatory Visit: Payer: Self-pay

## 2021-09-09 VITALS — BP 145/83 | HR 120 | Temp 98.3°F | Resp 18 | Wt 113.7 lb

## 2021-09-09 DIAGNOSIS — C3411 Malignant neoplasm of upper lobe, right bronchus or lung: Secondary | ICD-10-CM | POA: Diagnosis not present

## 2021-09-09 DIAGNOSIS — C3490 Malignant neoplasm of unspecified part of unspecified bronchus or lung: Secondary | ICD-10-CM | POA: Diagnosis not present

## 2021-09-09 NOTE — Progress Notes (Signed)
Pulmonary Individual Treatment Plan  Patient Details  Name: Julie Nicholson MRN: [id number] Date of Birth: 01/02/1966 Referring Provider:   April Manson Pulmonary Rehab Walk Test from 07/24/2021 in [place]  Referring Provider [name]       Initial Encounter Date:  Flowsheet Row Pulmonary Rehab Walk Test from 07/24/2021 in [place]  Date 07/24/21       Visit Diagnosis: Centrilobular emphysema ([place])  Patient's Home Medications on Admission:   Current Outpatient Medications:    acetaminophen (TYLENOL) 500 MG tablet, Take 1,000 mg by mouth every 6 (six) hours as needed for moderate pain., Disp: , Rfl:    albuterol (PROVENTIL) (2.5 MG/3ML) 0.083% nebulizer solution, albuterol sulfate 2.5 mg/3 mL (0.083 %) solution for nebulization  USE 1 VIAL IN NEBULIZER EVERY 6 HOURS AS NEEDED FOR WHEEZING OR SHORTNESS OF BREATH, Disp: , Rfl:    albuterol (VENTOLIN HFA) 108 (90 Base) MCG/ACT inhaler, Inhale 1-2 puffs into the lungs every 6 (six) hours as needed for wheezing or shortness of breath., Disp: 6.7 g, Rfl: 5   aspirin EC 81 MG tablet, Take 81 mg by mouth at bedtime., Disp: , Rfl:    atorvastatin (LIPITOR) 40 MG tablet, Take 40 mg by mouth at bedtime., Disp: , Rfl:    benzonatate (TESSALON) 100 MG capsule, Take 1-2 capsules (100-200 mg total) by mouth 3 (three) times daily as needed for cough., Disp: 30 capsule, Rfl: 0   Biotin 5000 MCG TABS, Take 5,000 mcg by mouth at bedtime., Disp: , Rfl:    buPROPion (WELLBUTRIN SR) 150 MG 12 hr tablet, Take 1 tablet by mouth twice daily, Disp: 60 tablet, Rfl: 0   Ca Carbonate-Mag Hydroxide (ROLAIDS PO), Take 1 tablet by mouth daily as needed (heartburn)., Disp: , Rfl:    famotidine (PEPCID) 20 MG tablet, Take 1 tablet by mouth twice daily, Disp: 60 tablet, Rfl: 5   fluticasone (FLONASE) 50 MCG/ACT nasal spray, Place 2 sprays into both nostrils daily as needed for allergies., Disp: , Rfl:     Fluticasone-Umeclidin-Vilant (TRELEGY ELLIPTA) 200-62.5-25 MCG/ACT AEPB, INHALE 1 PUFF ONCE DAILY, Disp: 60 each, Rfl: 2   lidocaine (XYLOCAINE) 2 % solution, Swish/gargle as directed 15 mLs in the mouth or throat every 4 (four) hours as needed for mouth pain (throat pain)., Disp: 100 mL, Rfl: 0   loratadine (CLARITIN) 10 MG tablet, Take 1 tablet by mouth once daily, Disp: 30 tablet, Rfl: 11   Multiple Vitamins-Minerals (HAIR SKIN AND NAILS FORMULA PO), Take 1 tablet by mouth daily., Disp: , Rfl:    ondansetron (ZOFRAN-ODT) 8 MG disintegrating tablet, Take by mouth., Disp: , Rfl:    Oxycodone HCl 10 MG TABS, Take 1 tablet by mouth 3 (three) times daily as needed (pain)., Disp: , Rfl:    pantoprazole (PROTONIX) 40 MG tablet, Take 1 tablet (40 mg total) by mouth daily., Disp: 30 tablet, Rfl: 5   predniSONE (DELTASONE) 10 MG tablet, Please take 5 tablets (50 mg) every morning for 1 week, followed by 4 tablets (40 mg) daily for 1 week, followed by 3 tablets (30 mg) daily for 1 week, followed by 2 tablets (20 mg) daily for 1 week, followed by 1 tablet (10 mg) daily for 1 week, Disp: 105 tablet, Rfl: 0   prochlorperazine (COMPAZINE) 10 MG tablet, Take 1 tablet (10 mg total) by mouth every 6 (six) hours as needed., Disp: 30 tablet, Rfl: 2   sucralfate (CARAFATE) 1 g  tablet, Dissolve 1 tablet in 10 mL H20 and swallow 30 min prior to meals and bedtime., Disp: 90 tablet, Rfl: 0   Tiotropium Bromide-Olodaterol (STIOLTO RESPIMAT) 2.5-2.5 MCG/ACT AERS, Inhale 2 puffs into the lungs daily., Disp: 4 g, Rfl: 3   UBRELVY 100 MG TABS, Take 100 mg by mouth daily as needed (migraine)., Disp: , Rfl:   Past Medical History: Past Medical History:  Diagnosis Date   Abdominal discomfort    Cancer ([place])    Chronic headaches    due to allergies, sinus   COPD (chronic obstructive pulmonary disease) ([place])    per 2012 chest xray   pt states she doesn not have this now (04/10/2013)   Deviated nasal septum    Eustachian tube  dysfunction    GERD (gastroesophageal reflux disease)    occasional uses Tums / Rolaids   Hearing loss    right ear   High cholesterol    History of radiation therapy    right lung 01/07/2021-02/19/2021  Dr Gery Pray   Migraine    "only once in a blue moon since RX'd allergy shots" (04/10/2013)   Pancreatitis 02/08/2013   Pneumonia    Rhinitis, allergic     Tobacco Use: Social History   Tobacco Use  Smoking Status Former   Packs/day: 0.50   Years: 41.00   Total pack years: 20.50   Types: Cigarettes   Quit date: 12/23/2020   Years since quitting: 0.7  Smokeless Tobacco Never    Labs: Review Flowsheet       Latest Ref Rng & Units 09/11/2010 03/01/2013  Labs for ITP Cardiac and Pulmonary Rehab  Cholestrol 0 - 200 mg/dL - 172   Direct LDL mg/dL - 103.3   HDL-C >39.00 mg/dL - 39.10   Trlycerides 0.0 - 149.0 mg/dL - 216.0   TCO2 0 - 100 mmol/L 25  -    Capillary Blood Glucose: Lab Results  Component Value Date   GLUCAP 102 (H) 12/12/2020     Pulmonary Assessment Scores:  Pulmonary Assessment Scores     Row Name 07/24/21 0933         ADL UCSD   SOB Score total 58       CAT Score   CAT Score 28       mMRC Score   mMRC Score 1             UCSD: Self-administered rating of dyspnea associated with activities of daily living (ADLs) 6-point scale (0 = "not at all" to 5 = "maximal or unable to do because of breathlessness")  Scoring Scores range from 0 to 120.  Minimally important difference is 5 units  CAT: CAT can identify the health impairment of COPD patients and is better correlated with disease progression.  CAT has a scoring range of zero to 40. The CAT score is classified into four groups of low (less than 10), medium (10 - 20), high (21-30) and very high (31-40) based on the impact level of disease on health status. A CAT score over 10 suggests significant symptoms.  A worsening CAT score could be explained by an exacerbation, poor medication  adherence, poor inhaler technique, or progression of COPD or comorbid conditions.  CAT MCID is 2 points  mMRC: mMRC (Modified Medical Research Council) Dyspnea Scale is used to assess the degree of baseline functional disability in patients of respiratory disease due to dyspnea. No minimal important difference is established. A decrease in score of 1 point or greater  is considered a positive change.   Pulmonary Function Assessment:  Pulmonary Function Assessment - 07/24/21 1222       Breath   Bilateral Breath Sounds Clear    Shortness of Breath Yes;Limiting activity;Fear of Shortness of Breath             Exercise Target Goals: Exercise Program Goal: Individual exercise prescription set using results from initial 6 min walk test and THRR while considering  patient's activity barriers and safety.   Exercise Prescription Goal: Initial exercise prescription builds to 30-45 minutes a day of aerobic activity, 2-3 days per week.  Home exercise guidelines will be given to patient during program as part of exercise prescription that the participant will acknowledge.  Activity Barriers & Risk Stratification:  Activity Barriers & Cardiac Risk Stratification - 07/24/21 1123       Activity Barriers & Cardiac Risk Stratification   Activity Barriers Deconditioning;Muscular Weakness;Shortness of Breath             6 Minute Walk:  6 Minute Walk     Row Name 07/24/21 1012         6 Minute Walk   Phase Initial     Distance 1400 feet     Walk Time 6 minutes     # of Rest Breaks 0     MPH 2.65     METS 3.89     RPE 11     Perceived Dyspnea  1     VO2 Peak 13.6     Symptoms No     Resting HR 87 bpm     Resting BP 104/50     Resting Oxygen Saturation  98 %     Exercise Oxygen Saturation  during 6 min walk 96 %     Max Ex. HR 103 bpm     Max Ex. BP 110/64     2 Minute Post BP 106/60       Interval HR   1 Minute HR 95     2 Minute HR 103     3 Minute HR 97     4 Minute  HR 97     5 Minute HR 100     6 Minute HR 98     2 Minute Post HR 88     Interval Heart Rate? Yes       Interval Oxygen   Interval Oxygen? Yes     Baseline Oxygen Saturation % 98 %     1 Minute Oxygen Saturation % 98 %     1 Minute Liters of Oxygen 0 L     2 Minute Oxygen Saturation % 96 %     2 Minute Liters of Oxygen 0 L     3 Minute Oxygen Saturation % 98 %     3 Minute Liters of Oxygen 0 L     4 Minute Oxygen Saturation % 93 %     4 Minute Liters of Oxygen 0 L     5 Minute Oxygen Saturation % 98 %     5 Minute Liters of Oxygen 0 L     6 Minute Oxygen Saturation % 98 %     6 Minute Liters of Oxygen 0 L     2 Minute Post Oxygen Saturation % 98 %     2 Minute Post Liters of Oxygen 0 L              Oxygen Initial Assessment:  Oxygen Initial  Assessment - 07/24/21 0922       Home Oxygen   Home Oxygen Device None    Sleep Oxygen Prescription None    Home Exercise Oxygen Prescription None    Home Resting Oxygen Prescription None      Initial 6 min Walk   Oxygen Used None      Program Oxygen Prescription   Program Oxygen Prescription None      Intervention   Short Term Goals To learn and exhibit compliance with exercise, home and travel O2 prescription;To learn and understand importance of monitoring SPO2 with pulse oximeter and demonstrate accurate use of the pulse oximeter.;To learn and understand importance of maintaining oxygen saturations>88%;To learn and demonstrate proper pursed lip breathing techniques or other breathing techniques. ;To learn and demonstrate proper use of respiratory medications    Long  Term Goals Exhibits compliance with exercise, home  and travel O2 prescription;Verbalizes importance of monitoring SPO2 with pulse oximeter and return demonstration;Maintenance of O2 saturations>88%;Exhibits proper breathing techniques, such as pursed lip breathing or other method taught during program session;Compliance with respiratory medication;Demonstrates  proper use of MDI's             Oxygen Re-Evaluation:  Oxygen Re-Evaluation     Row Name 08/10/21 0941 09/02/21 1127           Program Oxygen Prescription   Program Oxygen Prescription None None        Home Oxygen   Home Oxygen Device None None      Sleep Oxygen Prescription None None      Home Exercise Oxygen Prescription None None      Home Resting Oxygen Prescription None None        Goals/Expected Outcomes   Short Term Goals To learn and exhibit compliance with exercise, home and travel O2 prescription;To learn and understand importance of monitoring SPO2 with pulse oximeter and demonstrate accurate use of the pulse oximeter.;To learn and understand importance of maintaining oxygen saturations>88%;To learn and demonstrate proper pursed lip breathing techniques or other breathing techniques. ;To learn and demonstrate proper use of respiratory medications To learn and exhibit compliance with exercise, home and travel O2 prescription;To learn and understand importance of monitoring SPO2 with pulse oximeter and demonstrate accurate use of the pulse oximeter.;To learn and understand importance of maintaining oxygen saturations>88%;To learn and demonstrate proper pursed lip breathing techniques or other breathing techniques. ;To learn and demonstrate proper use of respiratory medications      Long  Term Goals Exhibits compliance with exercise, home  and travel O2 prescription;Verbalizes importance of monitoring SPO2 with pulse oximeter and return demonstration;Maintenance of O2 saturations>88%;Exhibits proper breathing techniques, such as pursed lip breathing or other method taught during program session;Compliance with respiratory medication;Demonstrates proper use of MDI's Exhibits compliance with exercise, home  and travel O2 prescription;Verbalizes importance of monitoring SPO2 with pulse oximeter and return demonstration;Maintenance of O2 saturations>88%;Exhibits proper breathing  techniques, such as pursed lip breathing or other method taught during program session;Compliance with respiratory medication;Demonstrates proper use of MDI's      Goals/Expected Outcomes Compliance and understanding of oxygen saturation monitoring and breathing techniques to decrease shortness of breath. Compliance and understanding of oxygen saturation monitoring and breathing techniques to decrease shortness of breath.               Oxygen Discharge (Final Oxygen Re-Evaluation):  Oxygen Re-Evaluation - 09/02/21 1127       Program Oxygen Prescription   Program Oxygen Prescription None  Home Oxygen   Home Oxygen Device None    Sleep Oxygen Prescription None    Home Exercise Oxygen Prescription None    Home Resting Oxygen Prescription None      Goals/Expected Outcomes   Short Term Goals To learn and exhibit compliance with exercise, home and travel O2 prescription;To learn and understand importance of monitoring SPO2 with pulse oximeter and demonstrate accurate use of the pulse oximeter.;To learn and understand importance of maintaining oxygen saturations>88%;To learn and demonstrate proper pursed lip breathing techniques or other breathing techniques. ;To learn and demonstrate proper use of respiratory medications    Long  Term Goals Exhibits compliance with exercise, home  and travel O2 prescription;Verbalizes importance of monitoring SPO2 with pulse oximeter and return demonstration;Maintenance of O2 saturations>88%;Exhibits proper breathing techniques, such as pursed lip breathing or other method taught during program session;Compliance with respiratory medication;Demonstrates proper use of MDI's    Goals/Expected Outcomes Compliance and understanding of oxygen saturation monitoring and breathing techniques to decrease shortness of breath.             Initial Exercise Prescription:  Initial Exercise Prescription - 07/24/21 1000       Date of Initial Exercise RX and  Referring Provider   Date 07/24/21    Referring Provider [name]    Expected Discharge Date 10/01/21      NuStep   Level 1    SPM 70    Minutes 15      Track   Minutes 15    METs 3.89      Prescription Details   Frequency (times per week) 2    Duration Progress to 30 minutes of continuous aerobic without signs/symptoms of physical distress      Intensity   THRR 40-80% of Max Heartrate 65-131    Ratings of Perceived Exertion 11-13    Perceived Dyspnea 0-4      Progression   Progression Continue to progress workloads to maintain intensity without signs/symptoms of physical distress.      Resistance Training   Training Prescription Yes    Weight red bands    Reps 10-15             Perform Capillary Blood Glucose checks as needed.  Exercise Prescription Changes:   Exercise Prescription Changes     Row Name 08/11/21 1200 08/20/21 1204 09/08/21 1200         Response to Exercise   Blood Pressure (Admit) 122/60 100/60 100/58     Blood Pressure (Exercise) 128/66 -- 118/62     Blood Pressure (Exit) 102/64 92/62 108/70     Heart Rate (Admit) 95 bpm 75 bpm 94 bpm     Heart Rate (Exercise) 130 bpm 114 bpm 106 bpm     Heart Rate (Exit) 92 bpm 92 bpm 86 bpm     Oxygen Saturation (Admit) 100 % 96 % 98 %     Oxygen Saturation (Exercise) 95 % 96 % 95 %     Oxygen Saturation (Exit) 98 % 97 % 98 %     Rating of Perceived Exertion (Exercise) '13 13 11     ' Perceived Dyspnea (Exercise) '1 1 1     ' Duration -- -- Continue with 30 min of aerobic exercise without signs/symptoms of physical distress.     Intensity --  40-80 % HRR THRR unchanged THRR unchanged       Progression   Progression -- -- Continue to progress workloads to maintain intensity without signs/symptoms of physical  distress.       Resistance Training   Training Prescription Yes Yes Yes     Weight Blue bands blue bands blue bands     Reps 10-15 10-15 10-15     Time 10 Minutes 10 Minutes 10 Minutes        Treadmill   MPH 2.1 2.1 2.1     Grade 0 0 0     Minutes '15 15 15     ' METs 2.61 2.61 2.61       NuStep   Level '2 3 4     ' SPM 70 70 80     Minutes '15 15 15     ' METs 2.8 3 3.4       Home Exercise Plan   Plans to continue exercise at -- -- Home (comment)  walking at home     Frequency -- -- --  n/a     Initial Home Exercises Provided -- -- 09/08/21              Exercise Comments:   Exercise Comments     Row Name 07/30/21 1500 09/08/21 1201         Exercise Comments Pt completed first day of exercise. She exercised 15 min on the track and Nustep. Julie Nicholson averaged 2.51 METs on the track and 2.0 METs at level 2 on the Nustep. Julie Nicholson performed the warmup and cooldown standing without limitations. Completed home exercise plan with pt. She is already walking 30-60 min 3 nonrehab days a week. Encouraged her to continue. I am confident she will. she is thinking of joining an exercise facility after pulmonary rehab.               Exercise Goals and Review:   Exercise Goals     Row Name 07/24/21 8242 08/10/21 0917 09/02/21 1122         Exercise Goals   Increase Physical Activity Yes Yes Yes     Intervention Provide advice, education, support and counseling about physical activity/exercise needs.;Develop an individualized exercise prescription for aerobic and resistive training based on initial evaluation findings, risk stratification, comorbidities and participant's personal goals. Provide advice, education, support and counseling about physical activity/exercise needs.;Develop an individualized exercise prescription for aerobic and resistive training based on initial evaluation findings, risk stratification, comorbidities and participant's personal goals. Provide advice, education, support and counseling about physical activity/exercise needs.;Develop an individualized exercise prescription for aerobic and resistive training based on initial evaluation findings, risk stratification,  comorbidities and participant's personal goals.     Expected Outcomes Short Term: Attend rehab on a regular basis to increase amount of physical activity.;Long Term: Add in home exercise to make exercise part of routine and to increase amount of physical activity.;Long Term: Exercising regularly at least 3-5 days a week. Short Term: Attend rehab on a regular basis to increase amount of physical activity.;Long Term: Add in home exercise to make exercise part of routine and to increase amount of physical activity.;Long Term: Exercising regularly at least 3-5 days a week. Short Term: Attend rehab on a regular basis to increase amount of physical activity.;Long Term: Add in home exercise to make exercise part of routine and to increase amount of physical activity.;Long Term: Exercising regularly at least 3-5 days a week.     Increase Strength and Stamina Yes Yes Yes     Intervention Provide advice, education, support and counseling about physical activity/exercise needs.;Develop an individualized exercise prescription for aerobic and resistive training based on initial  evaluation findings, risk stratification, comorbidities and participant's personal goals. Provide advice, education, support and counseling about physical activity/exercise needs.;Develop an individualized exercise prescription for aerobic and resistive training based on initial evaluation findings, risk stratification, comorbidities and participant's personal goals. Provide advice, education, support and counseling about physical activity/exercise needs.;Develop an individualized exercise prescription for aerobic and resistive training based on initial evaluation findings, risk stratification, comorbidities and participant's personal goals.     Expected Outcomes Short Term: Increase workloads from initial exercise prescription for resistance, speed, and METs.;Short Term: Perform resistance training exercises routinely during rehab and add in  resistance training at home;Long Term: Improve cardiorespiratory fitness, muscular endurance and strength as measured by increased METs and functional capacity (6MWT) Short Term: Increase workloads from initial exercise prescription for resistance, speed, and METs.;Short Term: Perform resistance training exercises routinely during rehab and add in resistance training at home;Long Term: Improve cardiorespiratory fitness, muscular endurance and strength as measured by increased METs and functional capacity (6MWT) Short Term: Increase workloads from initial exercise prescription for resistance, speed, and METs.;Short Term: Perform resistance training exercises routinely during rehab and add in resistance training at home;Long Term: Improve cardiorespiratory fitness, muscular endurance and strength as measured by increased METs and functional capacity (6MWT)     Able to understand and use rate of perceived exertion (RPE) scale Yes Yes Yes     Intervention Provide education and explanation on how to use RPE scale Provide education and explanation on how to use RPE scale Provide education and explanation on how to use RPE scale     Expected Outcomes Short Term: Able to use RPE daily in rehab to express subjective intensity level;Long Term:  Able to use RPE to guide intensity level when exercising independently Short Term: Able to use RPE daily in rehab to express subjective intensity level;Long Term:  Able to use RPE to guide intensity level when exercising independently Short Term: Able to use RPE daily in rehab to express subjective intensity level;Long Term:  Able to use RPE to guide intensity level when exercising independently     Able to understand and use Dyspnea scale Yes Yes Yes     Intervention Provide education and explanation on how to use Dyspnea scale Provide education and explanation on how to use Dyspnea scale Provide education and explanation on how to use Dyspnea scale     Expected Outcomes Short  Term: Able to use Dyspnea scale daily in rehab to express subjective sense of shortness of breath during exertion;Long Term: Able to use Dyspnea scale to guide intensity level when exercising independently Short Term: Able to use Dyspnea scale daily in rehab to express subjective sense of shortness of breath during exertion;Long Term: Able to use Dyspnea scale to guide intensity level when exercising independently Short Term: Able to use Dyspnea scale daily in rehab to express subjective sense of shortness of breath during exertion;Long Term: Able to use Dyspnea scale to guide intensity level when exercising independently     Knowledge and understanding of Target Heart Rate Range (THRR) Yes Yes Yes     Intervention Provide education and explanation of THRR including how the numbers were predicted and where they are located for reference Provide education and explanation of THRR including how the numbers were predicted and where they are located for reference Provide education and explanation of THRR including how the numbers were predicted and where they are located for reference     Expected Outcomes Short Term: Able to state/look up THRR;Long Term:  Able to use THRR to govern intensity when exercising independently;Short Term: Able to use daily as guideline for intensity in rehab Short Term: Able to state/look up THRR;Long Term: Able to use THRR to govern intensity when exercising independently;Short Term: Able to use daily as guideline for intensity in rehab Short Term: Able to state/look up THRR;Long Term: Able to use THRR to govern intensity when exercising independently;Short Term: Able to use daily as guideline for intensity in rehab     Understanding of Exercise Prescription Yes Yes Yes     Intervention Provide education, explanation, and written materials on patient's individual exercise prescription Provide education, explanation, and written materials on patient's individual exercise prescription  Provide education, explanation, and written materials on patient's individual exercise prescription     Expected Outcomes Short Term: Able to explain program exercise prescription;Long Term: Able to explain home exercise prescription to exercise independently Short Term: Able to explain program exercise prescription;Long Term: Able to explain home exercise prescription to exercise independently Short Term: Able to explain program exercise prescription;Long Term: Able to explain home exercise prescription to exercise independently              Exercise Goals Re-Evaluation :  Exercise Goals Re-Evaluation     Row Name 08/10/21 0917 09/02/21 1122           Exercise Goal Re-Evaluation   Exercise Goals Review Increase Physical Activity;Increase Strength and Stamina;Able to understand and use rate of perceived exertion (RPE) scale;Able to understand and use Dyspnea scale;Knowledge and understanding of Target Heart Rate Range (THRR);Understanding of Exercise Prescription Increase Physical Activity;Increase Strength and Stamina;Able to understand and use rate of perceived exertion (RPE) scale;Able to understand and use Dyspnea scale;Knowledge and understanding of Target Heart Rate Range (THRR);Understanding of Exercise Prescription      Comments Julie Nicholson has completed 2 exercise sessions. She exercises for 15 min on the treadmill and Nustep. Julie Nicholson averages 2.45 METs at 1.9 mph and 0% incline on the treadmill and 2.2 METs at level 2 on the Nustep. Julie Nicholson has been progressed from the track to the treadmill as she tolerates this fair. She has not walked on a treadmill, so there is a learning curve. We will still work on proper gait when walking on the treadmill. Julie Nicholson performs the warmup and cooldown standing without limitations. She is motivated to exercise and improve her functional capacity. Will continue to monitor and progress as able. Julie Nicholson has completed 6 exercise sessions. She exercises for 15 min on the treadmill  and Nustep. Julie Nicholson averages 2.61 METs, 2.1 mph @ 0, on the treadmill and 3.0 METs at level 3 on hte Nustep. Julie Nicholson performs the warmup and cooldown standing without any limitations. She has missed a few sessions due to a vacation. I have been unable to progress her because of her vacation. Will continue to monitor and progress as able.      Expected Outcomes Through exercise at rehab and home, the patient will decrease shortness of breath with daily activities and feel confident in carrying out an exercise regimen at home. Through exercise at rehab and home, the patient will decrease shortness of breath with daily activities and feel confident in carrying out an exercise regimen at home.               Discharge Exercise Prescription (Final Exercise Prescription Changes):  Exercise Prescription Changes - 09/08/21 1200       Response to Exercise   Blood Pressure (Admit) 100/58    Blood Pressure (  Exercise) 118/62    Blood Pressure (Exit) 108/70    Heart Rate (Admit) 94 bpm    Heart Rate (Exercise) 106 bpm    Heart Rate (Exit) 86 bpm    Oxygen Saturation (Admit) 98 %    Oxygen Saturation (Exercise) 95 %    Oxygen Saturation (Exit) 98 %    Rating of Perceived Exertion (Exercise) 11    Perceived Dyspnea (Exercise) 1    Duration Continue with 30 min of aerobic exercise without signs/symptoms of physical distress.    Intensity THRR unchanged      Progression   Progression Continue to progress workloads to maintain intensity without signs/symptoms of physical distress.      Resistance Training   Training Prescription Yes    Weight blue bands    Reps 10-15    Time 10 Minutes      Treadmill   MPH 2.1    Grade 0    Minutes 15    METs 2.61      NuStep   Level 4    SPM 80    Minutes 15    METs 3.4      Home Exercise Plan   Plans to continue exercise at Home (comment)   walking at home   Frequency --   n/a   Initial Home Exercises Provided 09/08/21             Nutrition:   Target Goals: Understanding of nutrition guidelines, daily intake of sodium <1555m, cholesterol <2072m calories 30% from fat and 7% or less from saturated fats, daily to have 5 or more servings of fruits and vegetables.  Biometrics:   Post Biometrics - 07/24/21 1016        Post  Biometrics   Grip Strength 17 kg             Nutrition Therapy Plan and Nutrition Goals:  Nutrition Therapy & Goals - 09/08/21 1238       Nutrition Therapy   Diet Heart healthy diet    Drug/Food Interactions Statins/Certain Fruits      Personal Nutrition Goals   Nutrition Goal Patient to choose a daily variety of fruits, vegetables, whole grains, lean protein/plant protein, and dairy products as part of heart healthy diet.    Personal Goal #2 Patient to understand strategies for weight gain of 0.5-2.0# per week.    Comments Goals in progress. Patient continues 2 Ensure daily (440 calories, 18g protein) to aid with weight gain/weight maintenance. Follow-up tomorrow with oncology; she reports increased difficulty swallowing, however, PO intake remains normal per patient.      Intervention Plan   Intervention Prescribe, educate and counsel regarding individualized specific dietary modifications aiming towards targeted core components such as weight, hypertension, lipid management, diabetes, heart failure and other comorbidities.    Expected Outcomes Short Term Goal: Understand basic principles of dietary content, such as calories, fat, sodium, cholesterol and nutrients.;Long Term Goal: Adherence to prescribed nutrition plan.             Nutrition Assessments:  MEDIFICTS Score Key: ?70 Need to make dietary changes  40-70 Heart Healthy Diet ? 40 Therapeutic Level Cholesterol Diet   Picture Your Plate Scores: <4<48nhealthy dietary pattern with much room for improvement. 41-50 Dietary pattern unlikely to meet recommendations for good health and room for improvement. 51-60 More healthful dietary  pattern, with some room for improvement.  >60 Healthy dietary pattern, although there may be some specific behaviors that could be improved.  Nutrition Goals Re-Evaluation:  Nutrition Goals Re-Evaluation     [place] Name 07/30/21 1104 08/11/21 1109 09/08/21 1238         Goals   Current Weight 114 lb 10.2 oz (52 kg) 116 lb 2.9 oz (52.7 kg) 113 lb 12.1 oz (51.6 kg)     Comment Patient reports her typical weight is 127-128#; she got down as low as 98# when diagnosed with cancer. She reports weight is appropriately trending upward. -- weight stable.     Expected Outcome -- Goals in progress. Patient continues 2 Ensure daily (440 calories, 18g protein) to aid with weight gain/weight maintenance. Weight stable; goal weight 128#. Goals in progress. Patient continues 2 Ensure daily (440 calories, 18g protein) to aid with weight gain/weight maintenance. Follow-up tomorrow with oncology; she reports increased difficulty swallowing, however, PO intake remains normal per patient.              Nutrition Goals Discharge (Final Nutrition Goals Re-Evaluation):  Nutrition Goals Re-Evaluation - 09/08/21 1238       Goals   Current Weight 113 lb 12.1 oz (51.6 kg)    Comment weight stable.    Expected Outcome Goals in progress. Patient continues 2 Ensure daily (440 calories, 18g protein) to aid with weight gain/weight maintenance. Follow-up tomorrow with oncology; she reports increased difficulty swallowing, however, PO intake remains normal per patient.             Psychosocial: Target Goals: Acknowledge presence or absence of significant depression and/or stress, maximize coping skills, provide positive support system. Participant is able to verbalize types and ability to use techniques and skills needed for reducing stress and depression.  Initial Review & Psychosocial Screening:  Initial Psych Review & Screening - 07/24/21 0912       Initial Review   Current issues with None Identified       Family Dynamics   Good Support System? Yes    Comments daughter, mother, son      Barriers   Psychosocial barriers to participate in program There are no identifiable barriers or psychosocial needs.      Screening Interventions   Interventions Encouraged to exercise             Quality of Life Scores:  Scores of 19 and below usually indicate a poorer quality of life in these areas.  A difference of  2-3 points is a clinically meaningful difference.  A difference of 2-3 points in the total score of the Quality of Life Index has been associated with significant improvement in overall quality of life, self-image, physical symptoms, and general health in studies assessing change in quality of life.  PHQ-9: Review Flowsheet       07/24/2021  Depression screen PHQ 2/9  Decreased Interest 0  Down, Depressed, Hopeless 0  PHQ - 2 Score 0  Altered sleeping 0  Tired, decreased energy 0  Change in appetite 0  Feeling bad or failure about yourself  0  Trouble concentrating 0  Moving slowly or fidgety/restless 0  Suicidal thoughts 0  PHQ-9 Score 0   Interpretation of Total Score  Total Score Depression Severity:  1-4 = Minimal depression, 5-9 = Mild depression, 10-14 = Moderate depression, 15-19 = Moderately severe depression, 20-27 = Severe depression   Psychosocial Evaluation and Intervention:  Psychosocial Evaluation - 07/24/21 0915       Psychosocial Evaluation & Interventions   Interventions Encouraged to exercise with the program and follow exercise prescription  Expected Outcomes For Julie Nicholson to participate in [place] rehab    Continue Psychosocial Services  Follow up required by staff             Psychosocial Re-Evaluation:  Psychosocial Re-Evaluation     [place] Name 08/12/21 1030 09/02/21 1534           Psychosocial Re-Evaluation   Current issues with None Identified None Identified      Comments Julie Nicholson has been able to participate in the PR program without  any psychosocial barriers.She has attended 3 exercise sessions. Julie Nicholson has been attending the PR program without any psychosocial barriers.      Expected Outcomes For her to continue to attend PR without any psychosocial issues or concerns. For Julie Nicholson to continue to particiapate in the program without any psychosocial issues ot concerns.      Interventions Encouraged to attend Pulmonary Rehabilitation for the exercise Encouraged to attend Pulmonary Rehabilitation for the exercise      Continue Psychosocial Services  Follow up required by staff Follow up required by staff               Psychosocial Discharge (Final Psychosocial Re-Evaluation):  Psychosocial Re-Evaluation - 09/02/21 1534       Psychosocial Re-Evaluation   Current issues with None Identified    Comments Julie Nicholson has been attending the PR program without any psychosocial barriers.    Expected Outcomes For Julie Nicholson to continue to particiapate in the program without any psychosocial issues ot concerns.    Interventions Encouraged to attend Pulmonary Rehabilitation for the exercise    Continue Psychosocial Services  Follow up required by staff             Education: Education Goals: Education classes will be provided on a weekly basis, covering required topics. Participant will state understanding/return demonstration of topics presented.  Learning Barriers/Preferences:  Learning Barriers/Preferences - 07/24/21 7939       Learning Barriers/Preferences   Learning Barriers Sight   wears glasses   Learning Preferences Video;Verbal Instruction;Skilled Demonstration;Pictoral;Individual Instruction;Group Instruction;Computer/Internet;Audio             Education Topics: Risk Factor Reduction:  -Group instruction that is supported by a PowerPoint presentation. Instructor discusses the definition of a risk factor, different risk factors for pulmonary disease, and how the heart and lungs work together.     Nutrition for Pulmonary  Patient:  -Group instruction provided by PowerPoint slides, verbal discussion, and written materials to support subject matter. The instructor gives an explanation and review of healthy diet recommendations, which includes a discussion on weight management, recommendations for fruit and vegetable consumption, as well as protein, fluid, caffeine, fiber, sodium, sugar, and alcohol. Tips for eating when patients are short of breath are discussed. Flowsheet Row PULMONARY REHAB OTHER RESPIRATORY from 09/03/2021 in [place]  Date 08/20/21  Educator Dietitian  Instruction Review Code 1- Verbalizes Understanding       Pursed Lip Breathing:  -Group instruction that is supported by demonstration and informational handouts. Instructor discusses the benefits of pursed lip and diaphragmatic breathing and detailed demonstration on how to preform both.   Flowsheet Row PULMONARY REHAB OTHER RESPIRATORY from 09/03/2021 in [place]  Date 08/06/21  Educator Donnetta Simpers  [Handout]  Instruction Review Code 1- Verbalizes Understanding       Oxygen Safety:  -Group instruction provided by PowerPoint, verbal discussion, and written material to support subject matter. There is an overview of "What is Oxygen"  and "Why do we need it".  Instructor also reviews how to create a safe environment for oxygen use, the importance of using oxygen as prescribed, and the risks of noncompliance. There is a brief discussion on traveling with oxygen and resources the patient may utilize.   Oxygen Equipment:  -Group instruction provided by [facility] Staff utilizing handouts, written materials, and equipment demonstrations.   Signs and Symptoms:  -Group instruction provided by written material and verbal discussion to support subject matter. Warning signs and symptoms of infection, stroke, and heart attack are reviewed and when to call the physician/911 reinforced. Tips  for preventing the spread of infection discussed.   Advanced Directives:  -Group instruction provided by verbal instruction and written material to support subject matter. Instructor reviews Advanced Directive laws and proper instruction for filling out document.   Pulmonary Video:  -Group video education that reviews the importance of medication and oxygen compliance, exercise, good nutrition, pulmonary hygiene, and pursed lip and diaphragmatic breathing for the pulmonary patient.   Exercise for the Pulmonary Patient:  -Group instruction that is supported by a PowerPoint presentation. Instructor discusses benefits of exercise, core components of exercise, frequency, duration, and intensity of an exercise routine, importance of utilizing pulse oximetry during exercise, safety while exercising, and options of places to exercise outside of rehab.   Flowsheet Row PULMONARY REHAB OTHER RESPIRATORY from 09/03/2021 in [place]  Date 09/03/21  Educator Donnetta Simpers  Instruction Review Code 2- Demonstrated Understanding       Pulmonary Medications:  -Verbally interactive group education provided by instructor with focus on inhaled medications and proper administration.   Anatomy and Physiology of the Respiratory System and Intimacy:  -Group instruction provided by PowerPoint, verbal discussion, and written material to support subject matter. Instructor reviews respiratory cycle and anatomical components of the respiratory system and their functions. Instructor also reviews differences in obstructive and restrictive respiratory diseases with examples of each. Intimacy, Sex, and Sexuality differences are reviewed with a discussion on how relationships can change when diagnosed with pulmonary disease. Common sexual concerns are reviewed.   MD DAY -A group question and answer session with a medical doctor that allows participants to ask questions that relate to their  pulmonary disease state.   OTHER EDUCATION -Group or individual verbal, written, or video instructions that support the educational goals of the pulmonary rehab program. [place] from 09/03/2021 in [place]  Date 08/13/21  Educator Kelle Darting  [place]rice [place] to PR]  Instruction Review Code 1- Verbalizes Understanding       Holiday Eating Survival Tips:  -Group instruction provided by PowerPoint slides, verbal discussion, and written materials to support subject matter. The instructor gives patients tips, tricks, and techniques to help them not only survive but enjoy the holidays despite the onslaught of food that accompanies the holidays.   Knowledge Questionnaire Score:  Knowledge Questionnaire Score - 07/24/21 0934       Knowledge Questionnaire Score   Pre Score 14/18             Core Components/Risk Factors/Patient Goals at Admission:  Personal Goals and Risk Factors at Admission - 07/24/21 0917       Core Components/Risk Factors/Patient Goals on Admission   Improve shortness of breath with ADL's Yes    Intervention Provide education, individualized exercise plan and daily activity instruction to help decrease symptoms of SOB with activities of daily living.    Expected Outcomes  Short Term: Improve cardiorespiratory fitness to achieve a reduction of symptoms when performing ADLs;Long Term: Be able to perform more ADLs without symptoms or delay the onset of symptoms             Core Components/Risk Factors/Patient Goals Review:   Goals and Risk Factor Review     Row Name 08/12/21 1036 09/02/21 1537           Core Components/Risk Factors/Patient Goals Review   Personal Goals Review Improve shortness of breath with ADL's;Develop more efficient breathing techniques such as purse lipped breathing and diaphragmatic breathing and practicing self-pacing with activity.;Increase knowledge of respiratory  medications and ability to use respiratory devices properly. Improve shortness of breath with ADL's;Develop more efficient breathing techniques such as purse lipped breathing and diaphragmatic breathing and practicing self-pacing with activity.;Increase knowledge of respiratory medications and ability to use respiratory devices properly.      Review Julie Nicholson is progressing well in the PR program.She is exercisng on the treadmill and nu-step. She started out on the track but has progressed to the treadmill.Julie Nicholson seems to be enjoying the PR program. She is increasing her workloads and METS.She is motivated to exercise and push herself to improve.She has been exercising on room air with saturations of 95-99%.We had breathing techniques education since she has been attending. Julie Nicholson has continued to progress well on the Nustep and treadmill. She has improved her workloads and METS. She is motivated to push herself to improve. She states that she is enjoying the program and is making connections with her classmates. Her room air saturations with exercisng  have been 94 to 97% and she reports mild SOB.      Expected Outcomes See admission goals.For her to continue to improve her METS and workloads. See admission goals               Core Components/Risk Factors/Patient Goals at Discharge (Final Review):   Goals and Risk Factor Review - 09/02/21 1537       Core Components/Risk Factors/Patient Goals Review   Personal Goals Review Improve shortness of breath with ADL's;Develop more efficient breathing techniques such as purse lipped breathing and diaphragmatic breathing and practicing self-pacing with activity.;Increase knowledge of respiratory medications and ability to use respiratory devices properly.    Review Julie Nicholson has continued to progress well on the Nustep and treadmill. She has improved her workloads and METS. She is motivated to push herself to improve. She states that she is enjoying the program and is making  connections with her classmates. Her room air saturations with exercisng  have been 94 to 97% and she reports mild SOB.    Expected Outcomes See admission goals             ITP Comments: Pt is making expected progress toward Pulmonary Rehab goals after completing 8 sessions. Recommend continued exercise, life style modification, education, and utilization of breathing techniques to increase stamina and strength, while also decreasing shortness of breath with exertion.  Dr. Rodman Pickle is Medical Director for Pulmonary Rehab at [facility].

## 2021-09-09 NOTE — Progress Notes (Signed)
[place] [phone]   [phone]) [phone]  OFFICE PROGRESS NOTE  [name], NP [place] [address]  DIAGNOSIS: Stage IIIB  (T1b, N3, M0) non-small cell lung cancer, adenocarcinoma she presented with right upper lobe nodule in addition to bulky right hilar, mediastinal, and left supraclavicular lymphadenopathy  DETECTED ALTERATION(S) / BIOMARKER(S) % CFDNA OR AMPLIFICATION ASSOCIATED FDA-APPROVED THERAPIES CLINICAL TRIAL AVAILABILITY [id number] ND 0.5 5 50 100 4.7%  [id number] ND 0.5 5 50 100 1.8%  [id number] ND 0.5 5 50 100 1.9%  BIOMARKER ADDITIONAL DETAILS Tumor Mutational Burden (TMB) 19.02 mut/Mb MSI Status Stable (MSS) PD-L1 Tumor Proportion Score (TPS)* <1%   PRIOR THERAPY:  1) Concurrent chemoradiation with carboplatin for an AUC of 2 and paclitaxel 45 mg per metered squared.  First dose on 01/05/2021.  Status post 7 cycles of treatment.  Last dose was given February 16, 2021. 2) Consolidation immunotherapy with Imfinzi 1500 Mg IV every 4 weeks.  First dose March 25, 2021.  Status post 3 cycles.  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   CURRENT THERAPY: Observation.  INTERVAL HISTORY: Julie Nicholson 56 y.o. female returns to the clinic today for 3 months follow-up visit.  The patient is very anxious and tearful about her recent scan results.  She is very tachycardic from the anxiety.  She denied having any current chest pain, shortness of breath except with exertion with no cough or hemoptysis.  She has no nausea, vomiting, diarrhea or constipation.  She has no headache or visual changes.  She has no recent weight loss or night sweats.  She has been in observation for the last several months.  She had repeat CT scan of the chest performed recently and she is here for evaluation and discussion of her scan results.  MEDICAL HISTORY: Past Medical  History:  Diagnosis Date   Abdominal discomfort    Cancer ([place])    Chronic headaches    due to allergies, sinus   COPD (chronic obstructive pulmonary disease) ([place])    per 2012 chest xray   pt states she doesn not have this now (04/10/2013)   Deviated nasal septum    Eustachian tube dysfunction    GERD (gastroesophageal reflux disease)    occasional uses Tums / Rolaids   Hearing loss    right ear   High cholesterol    History of radiation therapy    right lung 01/07/2021-02/19/2021  Dr Gery Pray   Migraine    "only once in a blue moon since RX'd allergy shots" (04/10/2013)   Pancreatitis 02/08/2013   Pneumonia    Rhinitis, allergic     ALLERGIES:  is allergic to benadryl [diphenhydramine].  MEDICATIONS:  Current Outpatient Medications  Medication Sig Dispense Refill   acetaminophen (TYLENOL) 500 MG tablet Take 1,000 mg by mouth every 6 (six) hours as needed for moderate pain.     albuterol (PROVENTIL) (2.5 MG/3ML) 0.083% nebulizer solution albuterol sulfate 2.5 mg/3 mL (0.083 %) solution for nebulization  USE 1 VIAL IN NEBULIZER EVERY 6 HOURS AS NEEDED FOR WHEEZING OR SHORTNESS OF BREATH     albuterol (VENTOLIN HFA) 108 (90 Base) MCG/ACT inhaler Inhale 1-2 puffs into the lungs every 6 (six) hours as needed for wheezing or shortness of breath. 6.7 g 5   amoxicillin-clavulanate (AUGMENTIN) 875-125 MG tablet Take 1 tablet by mouth every 12 (twelve) hours.     aspirin EC 81 MG tablet Take 81  mg by mouth at bedtime.     atorvastatin (LIPITOR) 40 MG tablet Take 40 mg by mouth at bedtime.     benzonatate (TESSALON) 100 MG capsule Take 1-2 capsules (100-200 mg total) by mouth 3 (three) times daily as needed for cough. 30 capsule 0   Biotin 5000 MCG TABS Take 5,000 mcg by mouth at bedtime.     buPROPion (WELLBUTRIN SR) 150 MG 12 hr tablet Take 1 tablet by mouth twice daily 60 tablet 0   Ca Carbonate-Mag Hydroxide (ROLAIDS PO) Take 1 tablet by mouth daily as needed (heartburn).      famotidine (PEPCID) 20 MG tablet Take 1 tablet by mouth twice daily 60 tablet 5   fluticasone (FLONASE) 50 MCG/ACT nasal spray Place 2 sprays into both nostrils daily as needed for allergies.     Fluticasone-Umeclidin-Vilant (TRELEGY ELLIPTA) 200-62.5-25 MCG/ACT AEPB INHALE 1 PUFF ONCE DAILY 60 each 2   lidocaine (XYLOCAINE) 2 % solution Swish/gargle as directed 15 mLs in the mouth or throat every 4 (four) hours as needed for mouth pain (throat pain). 100 mL 0   loratadine (CLARITIN) 10 MG tablet Take 1 tablet by mouth once daily 30 tablet 11   Multiple Vitamins-Minerals (HAIR SKIN AND NAILS FORMULA PO) Take 1 tablet by mouth daily.     ondansetron (ZOFRAN-ODT) 8 MG disintegrating tablet Take by mouth.     Oxycodone HCl 10 MG TABS Take 1 tablet by mouth 3 (three) times daily as needed (pain).     predniSONE (DELTASONE) 10 MG tablet Please take 5 tablets (50 mg) every morning for 1 week, followed by 4 tablets (40 mg) daily for 1 week, followed by 3 tablets (30 mg) daily for 1 week, followed by 2 tablets (20 mg) daily for 1 week, followed by 1 tablet (10 mg) daily for 1 week 105 tablet 0   prochlorperazine (COMPAZINE) 10 MG tablet Take 1 tablet (10 mg total) by mouth every 6 (six) hours as needed. 30 tablet 2   sucralfate (CARAFATE) 1 g tablet Dissolve 1 tablet in 10 mL H20 and swallow 30 min prior to meals and bedtime. 90 tablet 0   Tiotropium Bromide-Olodaterol (STIOLTO RESPIMAT) 2.5-2.5 MCG/ACT AERS Inhale 2 puffs into the lungs daily. 4 g 3   UBRELVY 100 MG TABS Take 100 mg by mouth daily as needed (migraine).     pantoprazole (PROTONIX) 40 MG tablet Take 1 tablet (40 mg total) by mouth daily. 30 tablet 5   No current facility-administered medications for this visit.    SURGICAL HISTORY:  Past Surgical History:  Procedure Laterality Date   ABDOMINAL HYSTERECTOMY  1995   tx endometriosis, both ovaries removed   APPENDECTOMY  late 1990's   BIOPSY  02/26/2021   Procedure: BIOPSY;  Surgeon:  Milus Banister, MD;  Location: [facility];  Service: Endoscopy;;   BRONCHIAL BRUSHINGS  12/15/2020   Procedure: BRONCHIAL BRUSHINGS;  Surgeon: Collene Gobble, MD;  Location: [place];  Service: Cardiopulmonary;;   BRONCHIAL NEEDLE ASPIRATION BIOPSY  12/15/2020   Procedure: BRONCHIAL NEEDLE ASPIRATION BIOPSIES;  Surgeon: Collene Gobble, MD;  Location: [place];  Service: Cardiopulmonary;;   CHOLECYSTECTOMY  04/10/2013   CHOLECYSTECTOMY N/A 04/10/2013   Procedure: LAPAROSCOPIC CHOLECYSTECTOMY WITH INTRAOPERATIVE CHOLANGIOGRAM;  Surgeon: Odis Hollingshead, MD;  Location: [place];  Service: General;  Laterality: N/A;   ELECTROMAGNETIC NAVIGATION BROCHOSCOPY  12/15/2020   Procedure: ELECTROMAGNETIC NAVIGATION BRONCHOSCOPY;  Surgeon: Collene Gobble, MD;  Location: [facility];  Service: Cardiopulmonary;;  ESOPHAGOGASTRODUODENOSCOPY (EGD) WITH PROPOFOL N/A 02/26/2021   Procedure: ESOPHAGOGASTRODUODENOSCOPY (EGD) WITH PROPOFOL;  Surgeon: Milus Banister, MD;  Location: [facility];  Service: Endoscopy;  Laterality: N/A;   EUS N/A 02/16/2013   Procedure: UPPER ENDOSCOPIC ULTRASOUND (EUS) LINEAR;  Surgeon: Milus Banister, MD;  Location: [facility];  Service: Endoscopy;  Laterality: N/A;  radial linear   KNEE ARTHROSCOPY Right 1980's   "cartilage OR"   LAPAROSCOPIC ENDOMETRIOSIS FULGURATION  1980's   MYRINGOTOMY WITH TUBE PLACEMENT Right 07/13/2018   Procedure: MYRINGOTOMY WITH TUBE PLACEMENT;  Surgeon: Margaretha Sheffield, MD;  Location: [place];  Service: ENT;  Laterality: Right;   MYRINGOTOMY WITH TUBE PLACEMENT Right 06/04/2021   Procedure: MYRINGOTOMY WITH BUTTERFLY TUBE PLACEMENT;  Surgeon: Margaretha Sheffield, MD;  Location: [place];  Service: ENT;  Laterality: Right;   NASOPHARYNGOSCOPY EUSTATION TUBE BALLOON DILATION Right 07/13/2018   Procedure: NASOPHARYNGOSCOPY EUSTATION TUBE BALLOON DILATION;  Surgeon: Margaretha Sheffield, MD;  Location: [place];  Service:  ENT;  Laterality: Right;   TONSILLECTOMY AND ADENOIDECTOMY  ~ 1980   adenoidectomy   TUBAL LIGATION  ~ [place] Right 07/13/2018   Procedure: OUTFRACTURE TURBINATE;  Surgeon: Margaretha Sheffield, MD;  Location: [place];  Service: ENT;  Laterality: Right;   VIDEO BRONCHOSCOPY WITH ENDOBRONCHIAL ULTRASOUND N/A 12/15/2020   Procedure: ROBOTIC VIDEO BRONCHOSCOPY WITH ENDOBRONCHIAL ULTRASOUND;  Surgeon: Collene Gobble, MD;  Location: [facility];  Service: Cardiopulmonary;  Laterality: N/A;   WRIST SURGERY Left    w/plate    REVIEW OF SYSTEMS:  Constitutional: positive for fatigue Eyes: negative Ears, nose, mouth, throat, and face: negative Respiratory: positive for dyspnea on exertion Cardiovascular: positive for tachycardic Gastrointestinal: negative Genitourinary:negative Integument/breast: negative Hematologic/lymphatic: negative Musculoskeletal:negative Neurological: negative Behavioral/Psych: positive for anxiety Endocrine: negative Allergic/Immunologic: negative   PHYSICAL EXAMINATION: General appearance: alert, cooperative, fatigued, and no distress Head: Normocephalic, without obvious abnormality, atraumatic Neck: moderate anterior cervical adenopathy, no JVD, supple, symmetrical, trachea midline, and thyroid not enlarged, symmetric, no tenderness/mass/nodules Lymph nodes: Cervical adenopathy: Right and Supraclavicular adenopathy: Right Resp: clear to auscultation bilaterally Back: symmetric, no curvature. ROM normal. No CVA tenderness. Cardio: Tachycardic GI: soft, non-tender; bowel sounds normal; no masses,  no organomegaly Extremities: extremities normal, atraumatic, no cyanosis or edema Neurologic: Alert and oriented X 3, normal strength and tone. Normal symmetric reflexes. Normal coordination and gait  ECOG PERFORMANCE STATUS: 1 - Symptomatic but completely ambulatory  Blood pressure (!) 145/83, pulse (!) 120, temperature 98.3 F (36.8 C),  temperature source Oral, resp. rate 18, weight 113 lb 11.2 oz (51.6 kg), SpO2 98 %.  LABORATORY DATA: Lab Results  Component Value Date   WBC 10.8 (H) 09/07/2021   HGB 14.5 09/07/2021   HCT 41.5 09/07/2021   MCV 94.7 09/07/2021   PLT 244 09/07/2021      Chemistry      Component Value Date/Time   NA 141 09/07/2021 1058   K 3.2 (L) 09/07/2021 1058   CL 104 09/07/2021 1058   CO2 30 09/07/2021 1058   BUN 14 09/07/2021 1058   CREATININE 0.80 09/07/2021 1058      Component Value Date/Time   CALCIUM 9.8 09/07/2021 1058   ALKPHOS 119 09/07/2021 1058   AST 14 (L) 09/07/2021 1058   ALT 17 09/07/2021 1058   BILITOT 0.6 09/07/2021 1058       RADIOGRAPHIC STUDIES: CT Chest W Contrast  Result Date: 09/08/2021 CLINICAL DATA:  Lung cancer restaging, status post chemotherapy and radiation *  Tracking Code: BO * EXAM: CT CHEST WITH CONTRAST TECHNIQUE: Multidetector CT imaging of the chest was performed during intravenous contrast administration. RADIATION DOSE REDUCTION: This exam was performed according to the departmental dose-optimization program which includes automated exposure control, adjustment of the mA and/or kV according to patient size and/or use of iterative reconstruction technique. CONTRAST:  21m OMNIPAQUE IOHEXOL 300 MG/ML  SOLN COMPARISON:  05/27/2021, 03/12/2021 FINDINGS: Cardiovascular: No significant vascular findings. Normal heart size. Scattered left coronary artery calcifications. No pericardial effusion. Mediastinum/Nodes: Significant interval enlargement low right anterior cervical and right superior mediastinal lymph nodes, measuring up to 2.3 x 1.6 cm, previously 1.5 x 0.9 cm (series 2, image 14) and 2.3 x 2.0 cm, previously 1.2 x 0.9 cm (series 2, image 25). Interval enlargement of matted appearing right paratracheal nodes measuring up to 2.2 x 1.4 cm, previously not discretely visualized (series 2, image 56). Thyroid gland, trachea, and esophagus demonstrate no  significant findings. Lungs/Pleura: Moderate centrilobular and paraseptal emphysema. Improved heterogeneous airspace disease and consolidation throughout the right upper lobe, with bandlike residual scarring fibrosis obscuring a previously seen spiculated nodule of the peripheral right upper lobe (series 5, image 51). No pleural effusion or pneumothorax. Upper Abdomen: No acute abnormality. Musculoskeletal: No chest wall abnormality. No acute osseous findings. IMPRESSION: 1. Significant enlargement of mediastinal and low right cervical lymph nodes, consistent with worsened nodal metastatic disease. 2. 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 3. Emphysema. 4. Coronary artery disease. Emphysema (ICD10-J43.9). Electronically Signed   By: ADelanna AhmadiM.D.   On: 09/08/2021 06:45    ASSESSMENT AND PLAN: This is a very pleasant 56years old white female with stage IIIB (T1b, N3, M0) non-small cell lung cancer, adenocarcinoma diagnosed in November 2022 with no actionable mutation and negative PD-L1 expression. The patient completed a course of concurrent chemoradiation with weekly carboplatin for AUC of 2 and paclitaxel 45 Mg/M2 status post 7 cycles.  She has been tolerating her treatment well except for the mild odynophagia and skin burns. She was also recently admitted to the hospital complaining of dysphagia and odynophagia secondary to radiation induced esophagitis.  She is feeling much better but continues to have residual dysphagia.  She is followed by gastroenterology and was seen by Dr. JArdis Hughsduring her hospitalization. Her scan showed improvement of her disease. I recommended for the patient treatment with consolidation immunotherapy with Imfinzi 1500  Mg IV every 4 weeks.  Status post 3 cycles.  Last dose was given in April 2023.  Her treatment was discontinued secondary to suspicious immunotherapy mediated pneumonitis with significant shortness of breath at that time and she was treated with a tapered dose of prednisone. She had repeat CT scan of the chest performed recently.  I personally and independently reviewed the scan images and discussed the result with the patient and her daughter. Unfortunately her scan showed evidence for disease recurrence with enlargement of lower right cervical lymph nodes as well as mediastinal lymphadenopathy.  She has palpable right cervical lymphadenopathy. I recommended for the patient to have a PET scan for further evaluation of these lymphadenopathy and if suspicious, will consider ultrasound-guided core biopsy of one of the right supraclavicular lymph nodes for confirmation of her tissue diagnosis. I will see the patient back for follow-up visit in 2 weeks for evaluation and discussion  of the PET scan results and further recommendation regarding her condition. The patient was advised to call immediately if she has any other concerning symptoms in the interval. The patient voices understanding of current disease status and treatment options and is in agreement with the current care plan.  All questions were answered. The patient knows to call the clinic with any problems, questions or concerns. We can certainly see the patient much sooner if necessary.  The total time spent in the appointment was 30 minutes.  Disclaimer: This note was dictated with voice recognition software. Similar sounding words can inadvertently be transcribed and may not be corrected upon review.

## 2021-09-10 ENCOUNTER — Encounter (HOSPITAL_COMMUNITY)
Admission: RE | Admit: 2021-09-10 | Discharge: 2021-09-10 | Disposition: A | Discharge status: Home / Self Care | Payer: Commercial Managed Care - PPO | Source: Ambulatory Visit | Attending: Emergency Medicine | Admitting: Emergency Medicine

## 2021-09-10 DIAGNOSIS — J432 Centrilobular emphysema: Secondary | ICD-10-CM

## 2021-09-10 DIAGNOSIS — C3411 Malignant neoplasm of upper lobe, right bronchus or lung: Secondary | ICD-10-CM | POA: Diagnosis not present

## 2021-09-10 NOTE — Progress Notes (Signed)
Daily Session Note  Patient Details  Name: Julie Nicholson MRN: [id number] Date of Birth: 23-Jun-1965 Referring Provider:   April Manson Pulmonary Rehab Walk Test from 07/24/2021 in [place]  Referring Provider [name]       Encounter Date: 09/10/2021  Check In:  Session Check In - 09/10/21 1150       Check-In   Supervising physician immediately available to respond to emergencies [facility] immediately available    Physician(s) Dr. Posey Pronto    Location [facility]    Staff Present Rosebud Poles, RN, BSN;Randi Olen Cordial BS, ACSM-CEP, Exercise Physiologist;Kaylee Rosana Hoes, MS, ACSM-CEP, Exercise Physiologist;David Makemson, MS, ACSM-CEP, CCRP, Exercise Physiologist    Virtual Visit No    Medication changes reported     No    Fall or balance concerns reported    No    Tobacco Cessation No Change    Warm-up and Cool-down Performed as group-led instruction    Resistance Training Performed Yes    VAD Patient? No    PAD/SET Patient? No      Pain Assessment   Currently in Pain? No/denies    Multiple Pain Sites No             Capillary Blood Glucose: No results found for this or any previous visit (from the past 24 hour(s)).    Social History   Tobacco Use  Smoking Status Former   Packs/day: 0.50   Years: 41.00   Total pack years: 20.50   Types: Cigarettes   Quit date: 12/23/2020   Years since quitting: 0.7  Smokeless Tobacco Never    Goals Met:  Proper associated with RPD/PD & O2 Sat Exercise tolerated well No report of concerns or symptoms today Strength training completed today  Goals Unmet:  Not Applicable  Comments: Service time is from [place] to [place]    Dr. Rodman Pickle is Medical Director for Pulmonary Rehab at [facility].

## 2021-09-10 NOTE — Progress Notes (Signed)
Daily Session Note  Patient Details  Name: Julie Nicholson MRN: [id number] Date of Birth: 1965-06-07 Referring Provider:   April Manson Pulmonary Rehab Walk Test from 07/24/2021 in [place]  Referring Provider [name]       Encounter Date: 09/10/2021  Check In:  Session Check In - 09/10/21 1150       Check-In   Supervising physician immediately available to respond to emergencies [facility] immediately available    Physician(s) Dr. Posey Pronto    [facility]    Staff Present Rosebud Poles, RN, BSN;Randi Olen Cordial BS, ACSM-CEP, Exercise Physiologist;Kaylee Rosana Hoes, MS, ACSM-CEP, Exercise Physiologist;David Makemson, MS, ACSM-CEP, CCRP, Exercise Physiologist    Virtual Visit No    Medication changes reported     No    Fall or balance concerns reported    No    Tobacco Cessation No Change    Warm-up and Cool-down Performed as group-led instruction    Resistance Training Performed Yes    VAD Patient? No    PAD/SET Patient? No      Pain Assessment   Currently in Pain? No/denies    Multiple Pain Sites No             Capillary Blood Glucose: No results found for this or any previous visit (from the past 24 hour(s)).    Social History   Tobacco Use  Smoking Status Former   Packs/day: 0.50   Years: 41.00   Total pack years: 20.50   Types: Cigarettes   Quit date: 12/23/2020   Years since quitting: 0.7  Smokeless Tobacco Never    Goals Met:  Proper associated with RPD/PD & O2 Sat Exercise tolerated well No report of concerns or symptoms today Strength training completed today  Goals Unmet:  Not Applicable  Comments: Service time is from 1016 to [place]    Dr. Rodman Pickle is Medical Director for Pulmonary Rehab at [facility].

## 2021-09-11 ENCOUNTER — Telehealth (HOSPITAL_COMMUNITY): Payer: Self-pay

## 2021-09-11 NOTE — Telephone Encounter (Signed)
Called Kim to discuss a call the department received from [place] at [facility]. Pt made aware of this.

## 2021-09-15 ENCOUNTER — Telehealth: Payer: Self-pay | Admitting: Emergency Medicine

## 2021-09-15 ENCOUNTER — Encounter (HOSPITAL_COMMUNITY)
Admission: RE | Admit: 2021-09-15 | Discharge: 2021-09-15 | Disposition: A | Discharge status: Home / Self Care | Payer: Commercial Managed Care - PPO | Source: Ambulatory Visit | Attending: Emergency Medicine | Admitting: Emergency Medicine

## 2021-09-15 DIAGNOSIS — C3411 Malignant neoplasm of upper lobe, right bronchus or lung: Secondary | ICD-10-CM | POA: Diagnosis not present

## 2021-09-15 DIAGNOSIS — J432 Centrilobular emphysema: Secondary | ICD-10-CM

## 2021-09-15 NOTE — Progress Notes (Signed)
Daily Session Note  Patient Details  Name: Julie Nicholson MRN: [id number] Date of Birth: 1965-11-24 Referring Provider:   April Manson Pulmonary Rehab Walk Test from 07/24/2021 in [place]  Referring Provider [name]       Encounter Date: 09/15/2021  Check In:  Session Check In - 09/15/21 1127       Check-In   Supervising physician immediately available to respond to emergencies [facility] immediately available    Physician(s) Dr. Posey Pronto    [facility]    Staff Present Rodney Langton, Cathleen Fears, MS, ACSM-CEP, Exercise Physiologist;Bailey Pearline Cables, MS, Exercise Physiologist;Carlette Wilber Oliphant, RN, BSN    Virtual Visit No    Medication changes reported     No    Fall or balance concerns reported    No    Tobacco Cessation No Change    Warm-up and Cool-down Performed as group-led instruction    Resistance Training Performed Yes    VAD Patient? No    PAD/SET Patient? No      Pain Assessment   Currently in Pain? No/denies    Multiple Pain Sites No             Capillary Blood Glucose: No results found for this or any previous visit (from the past 24 hour(s)).    Social History   Tobacco Use  Smoking Status Former   Packs/day: 0.50   Years: 41.00   Total pack years: 20.50   Types: Cigarettes   Quit date: 12/23/2020   Years since quitting: 0.7  Smokeless Tobacco Never    Goals Met:  Proper associated with RPD/PD & O2 Sat Independence with exercise equipment Exercise tolerated well No report of concerns or symptoms today Strength training completed today  Goals Unmet:  Not Applicable  Comments: Service time is from 1019 to 1150.    Dr. Rodman Pickle is Medical Director for Pulmonary Rehab at [facility].

## 2021-09-15 NOTE — Telephone Encounter (Signed)
Called patient and she wanted that a few weeks ago she went out of town and then came back that her neck was swollen. It was noted to be swollen lymph nodes. She recently had CT scan done and blood work. And she was told by Dr Inda Merlin that it is possibly cancer again. She has another CAT scan done next Tuesday.   Please advise on update sir

## 2021-09-15 NOTE — Telephone Encounter (Signed)
Thank you for letting me know. She has an OV with Mohamed on 8/22 - will need to keep that visit. Sounds like they will need to discuss treatment options.

## 2021-09-16 ENCOUNTER — Encounter (HOSPITAL_COMMUNITY)
Admission: RE | Admit: 2021-09-16 | Discharge: 2021-09-16 | Disposition: A | Discharge status: Home / Self Care | Payer: Commercial Managed Care - PPO | Source: Ambulatory Visit | Attending: Internal Medicine | Admitting: Internal Medicine

## 2021-09-16 DIAGNOSIS — C3490 Malignant neoplasm of unspecified part of unspecified bronchus or lung: Secondary | ICD-10-CM | POA: Insufficient documentation

## 2021-09-16 LAB — GLUCOSE, CAPILLARY: Glucose-Capillary: 80 mg/dL (ref 70–99)

## 2021-09-16 MED ORDER — FLUDEOXYGLUCOSE F - 18 (FDG) INJECTION
5.7000 | Freq: Once | INTRAVENOUS | Status: AC | PRN
  Administered 2021-09-16: 5.6 via INTRAVENOUS

## 2021-09-17 ENCOUNTER — Telehealth (HOSPITAL_COMMUNITY): Payer: Self-pay | Admitting: *Deleted

## 2021-09-17 ENCOUNTER — Encounter (HOSPITAL_COMMUNITY)
Admission: RE | Admit: 2021-09-17 | Discharge: 2021-09-17 | Disposition: A | Discharge status: Home / Self Care | Payer: Commercial Managed Care - PPO | Source: Ambulatory Visit | Attending: Emergency Medicine | Admitting: Emergency Medicine

## 2021-09-17 DIAGNOSIS — J432 Centrilobular emphysema: Secondary | ICD-10-CM

## 2021-09-17 DIAGNOSIS — C3411 Malignant neoplasm of upper lobe, right bronchus or lung: Secondary | ICD-10-CM | POA: Diagnosis not present

## 2021-09-17 NOTE — Progress Notes (Signed)
Daily Session Note  Patient Details  Name: Julie Nicholson MRN: [id number] Date of Birth: Jun 16, 1965 Referring Provider:   April Manson Pulmonary Rehab Walk Test from 07/24/2021 in [place]  Referring Provider [name]       Encounter Date: 09/17/2021  Check In:  Session Check In - 09/17/21 1129       Check-In   Supervising physician immediately available to respond to emergencies [facility] immediately available    Physician(s) Dr. Lonny Prude    [facility]    Staff Present Rosebud Poles, RN, BSN;Ramon Dredge, RN, Fernande Bras, MS, ACSM-CEP, Exercise Physiologist;Lisa Ysidro Evert, RN    Virtual Visit No    Medication changes reported     No    Fall or balance concerns reported    No    Tobacco Cessation No Change    Warm-up and Cool-down Performed as group-led instruction    Resistance Training Performed Yes    VAD Patient? No    PAD/SET Patient? No      Pain Assessment   Currently in Pain? No/denies    Multiple Pain Sites No             Capillary Blood Glucose: Results for orders placed or performed during the hospital encounter of 09/16/21 (from the past 24 hour(s))  Glucose, capillary     Status: None   Collection Time: 09/16/21  1:56 PM  Result Value Ref Range   Glucose-Capillary 80 70 - 99 mg/dL      Social History   Tobacco Use  Smoking Status Former   Packs/day: 0.50   Years: 41.00   Total pack years: 20.50   Types: Cigarettes   Quit date: 12/23/2020   Years since quitting: 0.7  Smokeless Tobacco Never    Goals Met:  Proper associated with RPD/PD & O2 Sat Independence with exercise equipment Exercise tolerated well No report of concerns or symptoms today Strength training completed today  Goals Unmet:  Not Applicable  Comments: Service time is from 1023 to 1146.    Dr. Rodman Pickle is Medical Director for Pulmonary Rehab at [facility].

## 2021-09-17 NOTE — Telephone Encounter (Signed)
Returned call from [place] to [employer] with pt's permission. Eduard Clos was asking about her exercise sessions and about the program . I discussed this with him. He is asking for documentation of the sessions that she is attending. I explained that I would need to discuss this with my Mudlogger.He voices understanding.I explained that I would contact on him on my return to work next week.

## 2021-09-22 ENCOUNTER — Encounter (HOSPITAL_COMMUNITY)
Admission: RE | Admit: 2021-09-22 | Discharge: 2021-09-22 | Disposition: A | Discharge status: Home / Self Care | Payer: Commercial Managed Care - PPO | Source: Ambulatory Visit | Attending: Emergency Medicine | Admitting: Emergency Medicine

## 2021-09-22 ENCOUNTER — Inpatient Hospital Stay (HOSPITAL_BASED_OUTPATIENT_CLINIC_OR_DEPARTMENT_OTHER): Payer: Commercial Managed Care - PPO | Admitting: Internal Medicine

## 2021-09-22 ENCOUNTER — Encounter: Payer: Self-pay | Admitting: Internal Medicine

## 2021-09-22 ENCOUNTER — Other Ambulatory Visit: Payer: Self-pay

## 2021-09-22 VITALS — BP 126/70 | HR 110 | Temp 98.5°F | Resp 16 | Wt 115.1 lb

## 2021-09-22 VITALS — Wt 114.0 lb

## 2021-09-22 DIAGNOSIS — C3490 Malignant neoplasm of unspecified part of unspecified bronchus or lung: Secondary | ICD-10-CM

## 2021-09-22 DIAGNOSIS — C3411 Malignant neoplasm of upper lobe, right bronchus or lung: Secondary | ICD-10-CM | POA: Diagnosis not present

## 2021-09-22 DIAGNOSIS — J432 Centrilobular emphysema: Secondary | ICD-10-CM

## 2021-09-22 NOTE — Progress Notes (Unsigned)
Criselda Peaches, MD  Donita Brooks D; P Ir Procedure Requests Approved for US guided bx of RIGHT [address].   [place]

## 2021-09-22 NOTE — Progress Notes (Signed)
Daily Session Note  Patient Details  Name: Julie Nicholson MRN: [id number] Date of Birth: 10/01/1965 Referring Provider:   Flowsheet Row Pulmonary Rehab Walk Test from 07/24/2021 in [facility]  Referring Provider [name]       Encounter Date: 09/22/2021  Check In:  Session Check In - 09/22/21 1121       Check-In   Supervising physician immediately available to respond to emergencies [facility] immediately available    Physician(s) Dr. Nettey    Location [facility]    Staff Present Joan Behrens, RN, BSN;Randi Reeve BS, ACSM-CEP, Exercise Physiologist;Kaylee Davis, MS, ACSM-CEP, Exercise Physiologist;Lisa Hughes, RN    Virtual Visit No    Medication changes reported     No    Fall or balance concerns reported    No    Tobacco Cessation No Change    Warm-up and Cool-down Performed as group-led instruction    Resistance Training Performed Yes    VAD Patient? No    PAD/SET Patient? No      Pain Assessment   Currently in Pain? No/denies    Multiple Pain Sites No             Capillary Blood Glucose: No results found for this or any previous visit (from the past 24 hour(s)).   Exercise Prescription Changes - 09/22/21 1200       Response to Exercise   Blood Pressure (Admit) 108/60    Blood Pressure (Exercise) 100/60    Blood Pressure (Exit) 96/50    Heart Rate (Admit) 107 bpm    Heart Rate (Exercise) 116 bpm    Heart Rate (Exit) 111 bpm    Oxygen Saturation (Admit) 98 %    Oxygen Saturation (Exercise) 97 %    Oxygen Saturation (Exit) 97 %    Rating of Perceived Exertion (Exercise) 13    Perceived Dyspnea (Exercise) 1    Duration Continue with 30 min of aerobic exercise without signs/symptoms of physical distress.    Intensity THRR unchanged      Progression   Progression Continue to progress workloads to maintain intensity without signs/symptoms of physical distress.      Resistance Training   Training  Prescription Yes    Weight Blue bands    Reps 10-15    Time 10 Minutes      Treadmill   MPH 2.5    Grade 0    Minutes 15      NuStep   Level 4    SPM 80    Minutes 15    METs 4.2             Social History   Tobacco Use  Smoking Status Former   Packs/day: 0.50   Years: 41.00   Total pack years: 20.50   Types: Cigarettes   Quit date: 12/23/2020   Years since quitting: 0.7  Smokeless Tobacco Never    Goals Met:  Proper associated with RPD/PD & O2 Sat Exercise tolerated well No report of concerns or symptoms today Strength training completed today  Goals Unmet:  Not Applicable  Comments: Service time is from 1020 to 1134    Dr. Jane Ellison is Medical Director for Pulmonary Rehab at [facility].  

## 2021-09-22 NOTE — Progress Notes (Signed)
[place] [phone]   [phone]  OFFICE PROGRESS NOTE  [name], NP [place] [address]  DIAGNOSIS: Recurrent locally advanced non-small cell lung cancer initially diagnosed as stage IIIB  (T1b, N3, M0) non-small cell lung cancer, adenocarcinoma she presented with right upper lobe nodule in addition to bulky right hilar, mediastinal, and left supraclavicular lymphadenopathy diagnosed in November 2022.  The patient had evidence for disease recurrence in the mediastinal and right supraclavicular lymphadenopathy in August 2023.  DETECTED ALTERATION(S) / BIOMARKER(S) % CFDNA OR AMPLIFICATION ASSOCIATED FDA-APPROVED THERAPIES CLINICAL TRIAL AVAILABILITY [id number] ND 0.5 5 50 100 4.7%  [id number] ND 0.5 5 50 100 1.8%  [id number] ND 0.5 5 50 100 1.9%  BIOMARKER ADDITIONAL DETAILS Tumor Mutational Burden (TMB) 19.02 mut/Mb MSI Status Stable (MSS) PD-L1 Tumor Proportion Score (TPS)* <1%   PRIOR THERAPY:  1) Concurrent chemoradiation with carboplatin for an AUC of 2 and paclitaxel 45 mg per metered squared.  First dose on 01/05/2021.  Status post 7 cycles of treatment.  Last dose was given February 16, 2021. 2) Consolidation immunotherapy with Imfinzi 1500 Mg IV every 4 weeks.  First dose March 25, 2021.  Status post 3 cycles.  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   CURRENT THERAPY: Observation.  INTERVAL HISTORY: Julie Nicholson 56 y.o. female returns to the clinic today for follow-up visit.  Her daughter Julie Nicholson was available by phone during the visit.  The patient continues to complain of increasing fatigue and weakness and anxiety about her recent disease recurrence.  She denied having any current chest pain, shortness of breath, cough or hemoptysis.  She has no nausea, vomiting, diarrhea or constipation.  She has no headache or visual changes.  She  has no recent weight loss or night sweats.  She was found on previous CT scan of the chest to have concerning finding for disease recurrence in the neck as well as the mediastinum.  The patient had a PET scan performed recently and she is here for evaluation and discussion of her scan results and recommendation regarding her condition.  MEDICAL HISTORY: Past Medical History:  Diagnosis Date   Abdominal discomfort    Cancer ([place])    Chronic headaches    due to allergies, sinus   COPD (chronic obstructive pulmonary disease) ([place])    per 2012 chest xray   pt states she doesn not have this now (04/10/2013)   Deviated nasal septum    Eustachian tube dysfunction    GERD (gastroesophageal reflux disease)    occasional uses Tums / Rolaids   Hearing loss    right ear   High cholesterol    History of radiation therapy    right lung 01/07/2021-02/19/2021  Dr Gery Pray   Migraine    "only once in a blue moon since RX'd allergy shots" (04/10/2013)   Pancreatitis 02/08/2013   Pneumonia    Rhinitis, allergic     ALLERGIES:  is allergic to benadryl [diphenhydramine].  MEDICATIONS:  Current Outpatient Medications  Medication Sig Dispense Refill   acetaminophen (TYLENOL) 500 MG tablet Take 1,000 mg by mouth every 6 (six) hours as needed for moderate pain.     albuterol (PROVENTIL) (2.5 MG/3ML) 0.083% nebulizer solution albuterol sulfate 2.5 mg/3 mL (0.083 %) solution for nebulization  USE 1 VIAL IN NEBULIZER EVERY 6 HOURS AS NEEDED FOR WHEEZING OR SHORTNESS OF BREATH     albuterol (VENTOLIN HFA) 108 (90  Base) MCG/ACT inhaler Inhale 1-2 puffs into the lungs every 6 (six) hours as needed for wheezing or shortness of breath. 6.7 g 5   amoxicillin-clavulanate (AUGMENTIN) 875-125 MG tablet Take 1 tablet by mouth every 12 (twelve) hours.     aspirin EC 81 MG tablet Take 81 mg by mouth at bedtime.     atorvastatin (LIPITOR) 40 MG tablet Take 40 mg by mouth at bedtime.     benzonatate (TESSALON) 100 MG  capsule Take 1-2 capsules (100-200 mg total) by mouth 3 (three) times daily as needed for cough. 30 capsule 0   Biotin 5000 MCG TABS Take 5,000 mcg by mouth at bedtime.     buPROPion (WELLBUTRIN SR) 150 MG 12 hr tablet Take 1 tablet by mouth twice daily 60 tablet 0   Ca Carbonate-Mag Hydroxide (ROLAIDS PO) Take 1 tablet by mouth daily as needed (heartburn).     famotidine (PEPCID) 20 MG tablet Take 1 tablet by mouth twice daily 60 tablet 5   fluticasone (FLONASE) 50 MCG/ACT nasal spray Place 2 sprays into both nostrils daily as needed for allergies.     Fluticasone-Umeclidin-Vilant (TRELEGY ELLIPTA) 200-62.5-25 MCG/ACT AEPB INHALE 1 PUFF ONCE DAILY 60 each 2   lidocaine (XYLOCAINE) 2 % solution Swish/gargle as directed 15 mLs in the mouth or throat every 4 (four) hours as needed for mouth pain (throat pain). 100 mL 0   loratadine (CLARITIN) 10 MG tablet Take 1 tablet by mouth once daily 30 tablet 11   Multiple Vitamins-Minerals (HAIR SKIN AND NAILS FORMULA PO) Take 1 tablet by mouth daily.     ondansetron (ZOFRAN-ODT) 8 MG disintegrating tablet Take by mouth.     Oxycodone HCl 10 MG TABS Take 1 tablet by mouth 3 (three) times daily as needed (pain).     pantoprazole (PROTONIX) 40 MG tablet Take 1 tablet (40 mg total) by mouth daily. 30 tablet 5   predniSONE (DELTASONE) 10 MG tablet Please take 5 tablets (50 mg) every morning for 1 week, followed by 4 tablets (40 mg) daily for 1 week, followed by 3 tablets (30 mg) daily for 1 week, followed by 2 tablets (20 mg) daily for 1 week, followed by 1 tablet (10 mg) daily for 1 week 105 tablet 0   prochlorperazine (COMPAZINE) 10 MG tablet Take 1 tablet (10 mg total) by mouth every 6 (six) hours as needed. 30 tablet 2   sucralfate (CARAFATE) 1 g tablet Dissolve 1 tablet in 10 mL H20 and swallow 30 min prior to meals and bedtime. 90 tablet 0   Tiotropium Bromide-Olodaterol (STIOLTO RESPIMAT) 2.5-2.5 MCG/ACT AERS Inhale 2 puffs into the lungs daily. 4 g 3    UBRELVY 100 MG TABS Take 100 mg by mouth daily as needed (migraine).     No current facility-administered medications for this visit.    SURGICAL HISTORY:  Past Surgical History:  Procedure Laterality Date   ABDOMINAL HYSTERECTOMY  1995   tx endometriosis, both ovaries removed   APPENDECTOMY  late 1990's   BIOPSY  02/26/2021   Procedure: BIOPSY;  Surgeon: Milus Banister, MD;  Location: [facility];  Service: Endoscopy;;   BRONCHIAL BRUSHINGS  12/15/2020   Procedure: BRONCHIAL BRUSHINGS;  Surgeon: Collene Gobble, MD;  Location: [facility];  Service: Cardiopulmonary;;   BRONCHIAL NEEDLE ASPIRATION BIOPSY  12/15/2020   Procedure: BRONCHIAL NEEDLE ASPIRATION BIOPSIES;  Surgeon: Collene Gobble, MD;  Location: [facility];  Service: Cardiopulmonary;;   CHOLECYSTECTOMY  04/10/2013   CHOLECYSTECTOMY N/A 04/10/2013  Procedure: LAPAROSCOPIC CHOLECYSTECTOMY WITH INTRAOPERATIVE CHOLANGIOGRAM;  Surgeon: Odis Hollingshead, MD;  Location: [facility];  Service: General;  Laterality: N/A;   ELECTROMAGNETIC NAVIGATION BROCHOSCOPY  12/15/2020   Procedure: ELECTROMAGNETIC NAVIGATION BRONCHOSCOPY;  Surgeon: Collene Gobble, MD;  Location: [facility];  Service: Cardiopulmonary;;   ESOPHAGOGASTRODUODENOSCOPY (EGD) WITH PROPOFOL N/A 02/26/2021   Procedure: ESOPHAGOGASTRODUODENOSCOPY (EGD) WITH PROPOFOL;  Surgeon: Milus Banister, MD;  Location: [facility];  Service: Endoscopy;  Laterality: N/A;   EUS N/A 02/16/2013   Procedure: UPPER ENDOSCOPIC ULTRASOUND (EUS) LINEAR;  Surgeon: Milus Banister, MD;  Location: [facility];  Service: Endoscopy;  Laterality: N/A;  radial linear   KNEE ARTHROSCOPY Right 1980's   "cartilage OR"   LAPAROSCOPIC ENDOMETRIOSIS FULGURATION  1980's   MYRINGOTOMY WITH TUBE PLACEMENT Right 07/13/2018   Procedure: MYRINGOTOMY WITH TUBE PLACEMENT;  Surgeon: Margaretha Sheffield, MD;  Location: [facility];  Service: ENT;  Laterality: Right;   MYRINGOTOMY WITH TUBE PLACEMENT Right  06/04/2021   Procedure: MYRINGOTOMY WITH BUTTERFLY TUBE PLACEMENT;  Surgeon: Margaretha Sheffield, MD;  Location: [facility];  Service: ENT;  Laterality: Right;   NASOPHARYNGOSCOPY EUSTATION TUBE BALLOON DILATION Right 07/13/2018   Procedure: NASOPHARYNGOSCOPY EUSTATION TUBE BALLOON DILATION;  Surgeon: Margaretha Sheffield, MD;  Location: [facility];  Service: ENT;  Laterality: Right;   TONSILLECTOMY AND ADENOIDECTOMY  ~ 1980   adenoidectomy   TUBAL LIGATION  ~ [place] Right 07/13/2018   Procedure: OUTFRACTURE TURBINATE;  Surgeon: Margaretha Sheffield, MD;  Location: [facility];  Service: ENT;  Laterality: Right;   VIDEO BRONCHOSCOPY WITH ENDOBRONCHIAL ULTRASOUND N/A 12/15/2020   Procedure: ROBOTIC VIDEO BRONCHOSCOPY WITH ENDOBRONCHIAL ULTRASOUND;  Surgeon: Collene Gobble, MD;  Location: [facility];  Service: Cardiopulmonary;  Laterality: N/A;   WRIST SURGERY Left    w/plate    REVIEW OF SYSTEMS:  Constitutional: positive for fatigue Eyes: negative Ears, nose, mouth, throat, and face: negative Respiratory: negative Cardiovascular: negative Gastrointestinal: negative Genitourinary:negative Integument/breast: negative Hematologic/lymphatic: negative Musculoskeletal:negative Neurological: negative Behavioral/Psych: positive for anxiety Endocrine: negative Allergic/Immunologic: negative   PHYSICAL EXAMINATION: General appearance: alert, cooperative, fatigued, and no distress Head: Normocephalic, without obvious abnormality, atraumatic Neck: moderate anterior cervical adenopathy, no JVD, supple, symmetrical, trachea midline, and thyroid not enlarged, symmetric, no tenderness/mass/nodules Lymph nodes: Cervical adenopathy: Right and Supraclavicular adenopathy: Right Resp: clear to auscultation bilaterally Back: symmetric, no curvature. ROM normal. No CVA tenderness. Cardio: Tachycardic GI: soft, non-tender; bowel sounds normal; no masses,  no  organomegaly Extremities: extremities normal, atraumatic, no cyanosis or edema Neurologic: Alert and oriented X 3, normal strength and tone. Normal symmetric reflexes. Normal coordination and gait  ECOG PERFORMANCE STATUS: 1 - Symptomatic but completely ambulatory  Blood pressure 126/70, pulse (!) 110, temperature 98.5 F (36.9 C), temperature source Oral, resp. rate 16, weight 115 lb 1 oz (52.2 kg), SpO2 98 %.  LABORATORY DATA: Lab Results  Component Value Date   WBC 10.8 (H) 09/07/2021   HGB 14.5 09/07/2021   HCT 41.5 09/07/2021   MCV 94.7 09/07/2021   PLT 244 09/07/2021      Chemistry      Component Value Date/Time   NA 141 09/07/2021 1058   K 3.2 (L) 09/07/2021 1058   CL 104 09/07/2021 1058   CO2 30 09/07/2021 1058   BUN 14 09/07/2021 1058   CREATININE 0.80 09/07/2021 1058      Component Value Date/Time   CALCIUM 9.8 09/07/2021 1058   ALKPHOS 119 09/07/2021 1058   AST  14 (L) 09/07/2021 1058   ALT 17 09/07/2021 1058   BILITOT 0.6 09/07/2021 1058       RADIOGRAPHIC STUDIES: NM PET Image Restage (PS) Skull Base to Thigh (F-18 FDG)  Result Date: 09/18/2021 CLINICAL DATA:  Follow-up treatment strategy for non-small cell lung cancer. EXAM: NUCLEAR MEDICINE PET SKULL BASE TO THIGH TECHNIQUE: 0.6 mCi F-18 FDG was injected intravenously. Full-ring PET imaging was performed from the skull base to thigh after the radiotracer. CT data was obtained and used for attenuation correction and anatomic localization. Fasting blood glucose: 80 mg/dl COMPARISON:  Chest CT dated September 07, 2021 FINDINGS: Mediastinal blood pool activity: SUV max 2.4 Liver activity: SUV max 2.9 NECK: Hypermetabolic right supraclavicular lymph nodes, largest measuring 2.8 x 2.5 cm on series 4, image 38, SUV max of 8.5, central hypodensity which is likely due to necrosis, previously measured 2.3 x 1.6 cm. Subcentimeter left supraclavicular lymph node measuring 0.6 cm short axis on series 4, image 38, SUV max of  5.6, unchanged in size when compared with prior exam. Incidental CT findings: None. CHEST: Hypermetabolic mediastinal and right hilar lymph nodes. Reference right upper paratracheal lymph node measuring 1.9 x 1.9 cm on series 4, image 43, SUV max of 9.6, not significantly changed when compared with prior exam and remeasured in similar plane. Reference right hilar lymph node measuring 1.0 cm in short axis on series 4, image 62, with SUV max of 6.1, unchanged in size when compared to prior exam. Incidental CT findings: Mild coronary artery calcifications of the LAD. Moderate centrilobular emphysema. Right upper lobe linear opacities, unchanged when compared with prior exam and likely postradiation change, mild diffuse uptake which is less than mediastinal blood pool. ABDOMEN/PELVIS: No abnormal hypermetabolic activity within the liver, pancreas, adrenal glands, or spleen. No hypermetabolic lymph nodes in the abdomen or pelvis. Incidental CT findings: Cholecystectomy clips. Atherosclerotic disease of the abdominal aorta. Hyperdense lesion of the anterior right kidney measuring 1.0 cm on series 4, image 107, with no FDG uptake, unchanged when compared to prior and likely a proteinaceous cyst. SKELETON: No focal hypermetabolic activity to suggest skeletal metastasis. Relative decreased FDG uptake is seen in the thoracic spine, likely postradiation change. Incidental CT findings: None. IMPRESSION: 1. Hypermetabolic mediastinal, right hilar and bilateral supraclavicular lymph nodes, compared with most recent prior chest CT dated September 07, 2021 there has been interval increased size of the largest right supraclavicular lymph node with evidence of central necrosis. 2. Right upper lobe postradiation change. 3. No evidence of metastatic disease in the abdomen or pelvis. Electronically Signed   By: Yetta Glassman M.D.   On: 09/18/2021 09:53   CT Chest W Contrast  Result Date: 09/08/2021 CLINICAL DATA:  Lung cancer  restaging, status post chemotherapy and radiation * Tracking Code: BO * EXAM: CT CHEST WITH CONTRAST TECHNIQUE: Multidetector CT imaging of the chest was performed during intravenous contrast administration. RADIATION DOSE REDUCTION: This exam was performed according to the departmental dose-optimization program which includes automated exposure control, adjustment of the mA and/or kV according to patient size and/or use of iterative reconstruction technique. CONTRAST:  1m OMNIPAQUE IOHEXOL 300 MG/ML  SOLN COMPARISON:  05/27/2021, 03/12/2021 FINDINGS: Cardiovascular: No significant vascular findings. Normal heart size. Scattered left coronary artery calcifications. No pericardial effusion. Mediastinum/Nodes: Significant interval enlargement low right anterior cervical and right superior mediastinal lymph nodes, measuring up to 2.3 x 1.6 cm, previously 1.5 x 0.9 cm (series 2, image 14) and 2.3 x 2.0 cm, previously 1.2 x 0.9  cm (series 2, image 25). Interval enlargement of matted appearing right paratracheal nodes measuring up to 2.2 x 1.4 cm, previously not discretely visualized (series 2, image 56). Thyroid gland, trachea, and esophagus demonstrate no significant findings. Lungs/Pleura: Moderate centrilobular and paraseptal emphysema. Improved heterogeneous airspace disease and consolidation throughout the right upper lobe, with bandlike residual scarring fibrosis obscuring a previously seen spiculated nodule of the peripheral right upper lobe (series 5, image 51). No pleural effusion or pneumothorax. Upper Abdomen: No acute abnormality. Musculoskeletal: No chest wall abnormality. No acute osseous findings. IMPRESSION: 1. Significant enlargement of mediastinal and low right cervical lymph nodes, consistent with worsened nodal metastatic disease. 2. 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 3. Emphysema. 4. Coronary artery disease. Emphysema (ICD10-J43.9). Electronically Signed   By: Delanna Ahmadi M.D.   On: 09/08/2021 06:45    ASSESSMENT AND PLAN: This is a very pleasant 56 years old white female with stage IIIB (T1b, N3, M0) non-small cell lung cancer, adenocarcinoma diagnosed in November 2022 with no actionable mutation and negative PD-L1 expression. The patient completed a course of concurrent chemoradiation with weekly carboplatin for AUC of 2 and paclitaxel 45 Mg/M2 status post 7 cycles.  She has been tolerating her treatment well except for the mild odynophagia and skin burns. She was also recently admitted to the hospital complaining of dysphagia and odynophagia secondary to radiation induced esophagitis.  She is feeling much better but continues to have residual dysphagia.  She is followed by gastroenterology and was seen by Dr. Ardis Hughs during her hospitalization. Her scan showed improvement of her disease. I recommended for the patient treatment with consolidation immunotherapy with Imfinzi 1500 Mg IV every 4 weeks.  Status post 3 cycles.  Last dose was given in April 2023.  Her treatment was discontinued secondary to suspicious immunotherapy mediated pneumonitis with significant shortness of breath at that time and she was treated with a tapered dose of prednisone. She had repeat CT scan of the chest performed recently.  I personally and independently reviewed the scan images and discussed the result with the patient and her daughter. Unfortunately her scan showed evidence for disease recurrence with enlargement of lower right cervical lymph nodes as well as mediastinal lymphadenopathy.  She has palpable right cervical lymphadenopathy. 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 There was no evidence for distant metastatic disease. I recommended for the patient to proceed with ultrasound-guided core biopsy of one of the right supraclavicular lymph node for confirmation of her tissue diagnosis and to rule out any other different histology. I will refer the patient to Dr. Sondra Come for consideration of palliative radiotherapy either as a single modality or in combination with concurrent chemoradiation if needed. I will also order MRI of the brain to rule out brain metastasis. I will see the patient back for follow-up visit in around 2 weeks for evaluation and consideration of concurrent chemotherapy if needed by Dr. Sondra Come. The patient and her daughter are in agreement with the current plan. She was advised to call immediately if she  has any concerning symptoms in the interval. The patient voices understanding of current disease status and treatment options and is in agreement with the current care plan.  All questions were answered. The patient knows to call the clinic with any problems, questions or concerns. We can certainly see the patient much sooner if necessary.  The total time spent in the appointment was 30 minutes.  Disclaimer: This note was dictated with voice recognition software. Similar sounding words can inadvertently be transcribed and may not be corrected upon review.

## 2021-09-23 ENCOUNTER — Telehealth: Payer: Self-pay | Admitting: Emergency Medicine

## 2021-09-23 NOTE — Telephone Encounter (Signed)
Patient dropped of long term disability form on 8/15 and called back today asking for status.  I called her back and let her know Dr. Lamonte Sakai will be in office on 8/29 and I will get him to sign it then.  She stated that she had an appointment with her oncologist on 8/22 and there is another spot on her lung, cancer.  She wanted to be sure Dr. Lamonte Sakai is aware.  She also needs to have a follow-up with him in Oct and I've asked someone from the front staff to call her back.

## 2021-09-24 ENCOUNTER — Other Ambulatory Visit: Payer: Self-pay

## 2021-09-24 ENCOUNTER — Encounter (HOSPITAL_COMMUNITY)
Admission: RE | Admit: 2021-09-24 | Discharge: 2021-09-24 | Disposition: A | Discharge status: Home / Self Care | Payer: Commercial Managed Care - PPO | Source: Ambulatory Visit | Attending: Emergency Medicine | Admitting: Emergency Medicine

## 2021-09-24 DIAGNOSIS — J432 Centrilobular emphysema: Secondary | ICD-10-CM

## 2021-09-24 DIAGNOSIS — C3411 Malignant neoplasm of upper lobe, right bronchus or lung: Secondary | ICD-10-CM | POA: Diagnosis not present

## 2021-09-24 NOTE — Progress Notes (Signed)
Histology and Location of Primary Cancer: Recurrent locally advanced non-small cell lung cancer initially diagnosed as stage IIIB  (T1b, N3, M0) non-small cell lung cancer, adenocarcinoma   Location(s) of Symptomatic tumor(s): disease recurrence in the mediastinal and right supraclavicular lymphadenopathy   Past/Anticipated chemotherapy by medical oncology, if any: concurrent chemoradiation if needed  Patient's main complaints related to symptomatic tumor(s) are: She reports her right neck lymph node was very swollen - to the size of the a baseball.  It is now better after medication.  Pain on a scale of 0-10 is: 0   SAFETY ISSUES: Prior radiation? 01/07/2021 through 02/19/2021 right lung 60 fx Pacemaker/ICD? no Possible current pregnancy? no Is the patient on methotrexate? no  Additional Complaints / other details:  US biopsy scheduled for 10/01/21. She reports having an occasional cough and sometimes coughs up mucus plugs that are brown.   BP 128/71 (BP Location: Left Arm, Patient Position: Sitting, Cuff Size: Normal)   Pulse 95   Temp 97.8 F (36.6 C) (Oral)   Resp 18   Ht 5\' 2"  (1.575 m)   Wt 114 lb 9.6 oz (52 kg)   SpO2 100%   BMI 20.96 kg/m

## 2021-09-24 NOTE — Progress Notes (Signed)
Daily Session Note  Patient Details  Name: Julie Nicholson MRN: [id number] Date of Birth: [birth year] Referring Provider:   April Manson Pulmonary Rehab Walk Test from 07/24/2021 in [place]  Referring Provider [name]       Encounter Date: 09/24/2021  Check In:  Session Check In - 09/24/21 1030       Check-In   Supervising physician immediately available to respond to emergencies [facility] immediately available    Physician(s) Dr. Florene Glen    Location [facility]    Staff Present Rosebud Poles, RN, BSN;Randi Olen Cordial BS, ACSM-CEP, Exercise Physiologist;Shanetra Blumenstock Rosana Hoes, MS, ACSM-CEP, Exercise Physiologist;Lisa Ysidro Evert, RN    Virtual Visit No    Medication changes reported     No    Fall or balance concerns reported    No    Tobacco Cessation No Change    Warm-up and Cool-down Performed as group-led instruction    Resistance Training Performed Yes    VAD Patient? No    PAD/SET Patient? No      Pain Assessment   Currently in Pain? No/denies    Multiple Pain Sites No             Capillary Blood Glucose: No results found for this or any previous visit (from the past 24 hour(s)).    Social History   Tobacco Use  Smoking Status Former   Packs/day: 0.50   Years: 41.00   Total pack years: 20.50   Types: Cigarettes   Quit date: 12/23/2020   Years since quitting: 0.7  Smokeless Tobacco Never    Goals Met:  Proper associated with RPD/PD & O2 Sat Independence with exercise equipment Exercise tolerated well No report of concerns or symptoms today Strength training completed today  Goals Unmet:  Not Applicable  Comments: Service time is from 1003 to 1138.    Dr. Rodman Pickle is Medical Director for Pulmonary Rehab at [facility].

## 2021-09-27 NOTE — Progress Notes (Signed)
Radiation Oncology         [phone] ________________________________  Outpatient Re-Consultation  Name: Julie Nicholson MRN: [id number]  Date: 09/28/2021  DOB: 1965/06/19  FM:[id number], Joelene Millin, NP  Curt Bears, MD   REFERRING PHYSICIAN: Curt Bears, MD  DIAGNOSIS: There were no encounter diagnoses.  Recurrent locally advanced non-small cell lung cancer initially diagnosed as stage IIIB  (T1b, N3, M0) non-small cell lung cancer, adenocarcinoma she presented with right upper lobe nodule in addition to bulky right hilar, mediastinal, and left supraclavicular lymphadenopathy diagnosed in November 2022.  The patient had evidence for disease recurrence in the mediastinal and right supraclavicular lymphadenopathy in August 2023.  Interval Since Last Radiation: 7 months and 9 days    Intent: Curative  Radiation Treatment Dates: 01/07/2021 through 02/19/2021 Site Technique Total Dose (Gy) Dose per Fx (Gy) Completed Fx Beam Energies  Lung, Right: Lung_Rt 3D 60/60 2 30/30 6X    HISTORY OF PRESENT ILLNESS::Julie Nicholson is a 56 y.o. female who is accompanied by ***. she is seen as a courtesy of Dr. Julien Nordmann for re-evaluation and an opinion concerning palliative radiation therapy as part of management for her recently diagnosed nodal metastases from [facility] right lung cancer primary. I last met with the patient for follow up on 03/23/21 1 month after radiation to the right lung. Since that time, the patient continued on with observation and consolidation immunotherapy consisting of Imfinzi 4 weeks. X 3 cycles under Dr. Julien Nordmann. This was discontinued following cycle 3 secondary to suspicious immunotherapy mediated pneumonitis.  The patient recently presented for a follow up chest CT on 09/07/21 which unfortunately showed new significant enlargement of mediastinal and low right cervical lymph nodes, consistent with worsened nodal metastatic disease. 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  (CTA of the chest on 05/27/21 showed no evidence suggestive of lymphadenopathy, and normal radiation changes in the right upper and lower lobes).  Accordingly, the patient followed up with Dr. Julien Nordmann on 09/09/21. On examination, the patient was noted to have palpable right cervical lymphadenopathy. Given recent findings, Dr. Julien Nordmann recommended further work-up consisting of a PET scan and possible nodal biopsies.   Restaging PET scan on 09/16/21 showed the mediastinal, right hilar and bilateral supraclavicular lymph nodes as hypermetabolic, and an interval increase in size of the largest right supraclavicular lymph node compared to recent CT dated 09/07/21. New evidence of central necrosis was also noted to the large right supraclavicular lymph node. PET otherwise showed no evidence of metastatic disease in the abdomen or pelvis. (PET also redemonstrated right upper lobe postradiation changes).   During her most recent follow up with Dr. Julien Nordmann on 09/22/21, the patient endorsed increasing fatigue and weakness and anxiety about her recent disease recurrence. She otherwise denied any respiratory symptoms, nausea, vomiting, diarrhea, weight loss, or headaches. Given findings on her most recent PET scan, Dr. Julien Nordmann would like to proceed with ultrasound-guided core biopsy of one of the right supraclavicular lymph nodes for confirmation of her tissue diagnosis and to rule out any other different histology. Pending our discussion today, the patient may also return to Dr. Julien Nordmann for consideration of concurrent chemotherapy.   The patient is scheduled to undergo nodal biopsies on 10/01/21 with Dr. Julien Nordmann. She also has an MRI of the  brain scheduled  for 09/30/21.    PAST MEDICAL HISTORY:  Past Medical History:  Diagnosis Date   Abdominal discomfort    Cancer ([place])    Chronic headaches    due to allergies, sinus   COPD (chronic obstructive pulmonary disease) ([place])    per 2012 chest xray   pt states she doesn not have this now (04/10/2013)   Deviated nasal septum    Eustachian tube dysfunction    GERD (gastroesophageal reflux disease)    occasional uses Tums / Rolaids   Hearing loss    right ear   High cholesterol    History of radiation therapy    right lung 01/07/2021-02/19/2021  Dr Gery Pray   Migraine    "only once in a blue moon since RX'd allergy shots" (04/10/2013)   Pancreatitis 02/08/2013   Pneumonia    Rhinitis, allergic     PAST SURGICAL HISTORY: Past Surgical History:  Procedure Laterality Date   ABDOMINAL HYSTERECTOMY  1995   tx endometriosis, both ovaries removed   APPENDECTOMY  late 1990's   BIOPSY  02/26/2021   Procedure: BIOPSY;  Surgeon: Milus Banister, MD;  Location: [facility];  Service: Endoscopy;;   BRONCHIAL BRUSHINGS  12/15/2020   Procedure: BRONCHIAL BRUSHINGS;  Surgeon: Collene Gobble, MD;  Location: [place];  Service: Cardiopulmonary;;   BRONCHIAL NEEDLE ASPIRATION BIOPSY  12/15/2020   Procedure: BRONCHIAL NEEDLE ASPIRATION BIOPSIES;  Surgeon: Collene Gobble, MD;  Location: [place];  Service: Cardiopulmonary;;   CHOLECYSTECTOMY  04/10/2013   CHOLECYSTECTOMY N/A 04/10/2013   Procedure: LAPAROSCOPIC CHOLECYSTECTOMY WITH INTRAOPERATIVE CHOLANGIOGRAM;  Surgeon: Odis Hollingshead, MD;  Location: [place];  Service: General;  Laterality: N/A;   ELECTROMAGNETIC NAVIGATION BROCHOSCOPY  12/15/2020   Procedure: ELECTROMAGNETIC NAVIGATION BRONCHOSCOPY;  Surgeon: Collene Gobble, MD;  Location: [facility];  Service: Cardiopulmonary;;   ESOPHAGOGASTRODUODENOSCOPY (EGD) WITH PROPOFOL N/A 02/26/2021   Procedure: ESOPHAGOGASTRODUODENOSCOPY (EGD) WITH PROPOFOL;  Surgeon: Milus Banister, MD;  Location: [facility];  Service: Endoscopy;  Laterality: N/A;   EUS N/A 02/16/2013   Procedure: UPPER ENDOSCOPIC ULTRASOUND (EUS) LINEAR;  Surgeon: Milus Banister, MD;  Location: [facility];  Service: Endoscopy;  Laterality: N/A;  radial linear   KNEE ARTHROSCOPY Right 1980's   "cartilage OR"   LAPAROSCOPIC ENDOMETRIOSIS FULGURATION  1980's   MYRINGOTOMY WITH TUBE PLACEMENT Right 07/13/2018   Procedure: MYRINGOTOMY WITH TUBE PLACEMENT;  Surgeon: Margaretha Sheffield, MD;  Location: [place];  Service: ENT;  Laterality: Right;   MYRINGOTOMY WITH TUBE PLACEMENT Right 06/04/2021   Procedure: MYRINGOTOMY WITH BUTTERFLY TUBE PLACEMENT;  Surgeon: Margaretha Sheffield, MD;  Location: [place];  Service: ENT;  Laterality: Right;   NASOPHARYNGOSCOPY EUSTATION TUBE BALLOON DILATION Right 07/13/2018   Procedure: NASOPHARYNGOSCOPY EUSTATION TUBE BALLOON DILATION;  Surgeon: Margaretha Sheffield, MD;  Location: [place];  Service: ENT;  Laterality: Right;   TONSILLECTOMY AND ADENOIDECTOMY  ~ 1980   adenoidectomy   TUBAL LIGATION  ~ [place] Right 07/13/2018   Procedure: OUTFRACTURE TURBINATE;  Surgeon: Margaretha Sheffield, MD;  Location: [place];  Service: ENT;  Laterality: Right;   VIDEO BRONCHOSCOPY WITH ENDOBRONCHIAL ULTRASOUND N/A 12/15/2020   Procedure: ROBOTIC VIDEO BRONCHOSCOPY WITH ENDOBRONCHIAL ULTRASOUND;  Surgeon: Collene Gobble, MD;  Location: [facility];  Service: Cardiopulmonary;  Laterality: N/A;   WRIST SURGERY Left    w/plate    FAMILY HISTORY:  Family History  Problem Relation Age of Onset   Diabetes Father  Diabetes Mother    COPD Mother    COPD Maternal Grandfather    COPD Maternal Aunt    COPD Maternal Uncle     SOCIAL HISTORY:  Social History   Tobacco Use   Smoking status: Former    Packs/day: 0.50    Years: 41.00    Total pack years: 20.50    Types: Cigarettes    Quit date: 12/23/2020    Years since  quitting: 0.7   Smokeless tobacco: Never  Vaping Use   Vaping Use: Never used  Substance Use Topics   Alcohol use: Not Currently    Comment: occasional   Drug use: No    ALLERGIES:  Allergies  Allergen Reactions   Benadryl [Diphenhydramine] Other (See Comments)    Oversedation - 50 mg IV given 01/05/21; switched to IV Certirizine.    MEDICATIONS:  Current Outpatient Medications  Medication Sig Dispense Refill   acetaminophen (TYLENOL) 500 MG tablet Take 1,000 mg by mouth every 6 (six) hours as needed for moderate pain.     albuterol (PROVENTIL) (2.5 MG/3ML) 0.083% nebulizer solution albuterol sulfate 2.5 mg/3 mL (0.083 %) solution for nebulization  USE 1 VIAL IN NEBULIZER EVERY 6 HOURS AS NEEDED FOR WHEEZING OR SHORTNESS OF BREATH     albuterol (VENTOLIN HFA) 108 (90 Base) MCG/ACT inhaler Inhale 1-2 puffs into the lungs every 6 (six) hours as needed for wheezing or shortness of breath. 6.7 g 5   aspirin EC 81 MG tablet Take 81 mg by mouth at bedtime.     atorvastatin (LIPITOR) 40 MG tablet Take 40 mg by mouth at bedtime.     benzonatate (TESSALON) 100 MG capsule Take 1-2 capsules (100-200 mg total) by mouth 3 (three) times daily as needed for cough. 30 capsule 0   Biotin 5000 MCG TABS Take 5,000 mcg by mouth at bedtime.     buPROPion (WELLBUTRIN SR) 150 MG 12 hr tablet Take 1 tablet by mouth twice daily 60 tablet 0   Ca Carbonate-Mag Hydroxide (ROLAIDS PO) Take 1 tablet by mouth daily as needed (heartburn).     famotidine (PEPCID) 20 MG tablet Take 1 tablet by mouth twice daily 60 tablet 5   fluticasone (FLONASE) 50 MCG/ACT nasal spray Place 2 sprays into both nostrils daily as needed for allergies.     Fluticasone-Umeclidin-Vilant (TRELEGY ELLIPTA) 200-62.5-25 MCG/ACT AEPB INHALE 1 PUFF ONCE DAILY 60 each 2   loratadine (CLARITIN) 10 MG tablet Take 1 tablet by mouth once daily 30 tablet 11   Multiple Vitamins-Minerals (HAIR SKIN AND NAILS FORMULA PO) Take 1 tablet by mouth daily.      ondansetron (ZOFRAN-ODT) 8 MG disintegrating tablet Take by mouth.     Oxycodone HCl 10 MG TABS Take 1 tablet by mouth 3 (three) times daily as needed (pain).     pantoprazole (PROTONIX) 40 MG tablet Take 1 tablet (40 mg total) by mouth daily. 30 tablet 5   prochlorperazine (COMPAZINE) 10 MG tablet Take 1 tablet (10 mg total) by mouth every 6 (six) hours as needed. 30 tablet 2   Tiotropium Bromide-Olodaterol (STIOLTO RESPIMAT) 2.5-2.5 MCG/ACT AERS Inhale 2 puffs into the lungs daily. 4 g 3   UBRELVY 100 MG TABS Take 100 mg by mouth daily as needed (migraine).     No current facility-administered medications for this encounter.    REVIEW OF SYSTEMS:  A 10+ POINT REVIEW OF SYSTEMS WAS OBTAINED including neurology, dermatology, psychiatry, cardiac, respiratory, lymph, extremities, GI, GU, musculoskeletal, constitutional, reproductive, HEENT. ***  PHYSICAL EXAM:  vitals were not taken for this visit.   General: Alert and oriented, in no acute distress HEENT: Head is normocephalic. Extraocular movements are intact. Oropharynx is clear. Neck: Neck is supple, no palpable cervical or supraclavicular lymphadenopathy. Heart: Regular in rate and rhythm with no murmurs, rubs, or gallops. Chest: Clear to auscultation bilaterally, with no rhonchi, wheezes, or rales. Abdomen: Soft, nontender, nondistended, with no rigidity or guarding. Extremities: No cyanosis or edema. Lymphatics: see Neck Exam Skin: No concerning lesions. Musculoskeletal: symmetric strength and muscle tone throughout. Neurologic: Cranial nerves II through XII are grossly intact. No obvious focalities. Speech is fluent. Coordination is intact. Psychiatric: Judgment and insight are intact. Affect is appropriate. ***  ECOG = ***  0 - Asymptomatic (Fully active, able to carry on all predisease activities without restriction)  1 - Symptomatic but completely ambulatory (Restricted in physically strenuous activity but ambulatory and  able to carry out work of a light or sedentary nature. For example, light housework, office work)  2 - Symptomatic, <50% in bed during the day (Ambulatory and capable of all self care but unable to carry out any work activities. Up and about more than 50% of waking hours)  3 - Symptomatic, >50% in bed, but not bedbound (Capable of only limited self-care, confined to bed or chair 50% or more of waking hours)  4 - Bedbound (Completely disabled. Cannot carry on any self-care. Totally confined to bed or chair)  5 - Death   Eustace Pen MM, Creech RH, Tormey DC, et al. [phone]). "Toxicity and response criteria of the [facility]". [place] Oncol. 5 (6): 649-55  LABORATORY DATA:  Lab Results  Component Value Date   WBC 10.8 (H) 09/07/2021   HGB 14.5 09/07/2021   HCT 41.5 09/07/2021   MCV 94.7 09/07/2021   PLT 244 09/07/2021   NEUTROABS 9.0 (H) 09/07/2021   Lab Results  Component Value Date   NA 141 09/07/2021   K 3.2 (L) 09/07/2021   CL 104 09/07/2021   CO2 30 09/07/2021   GLUCOSE 102 (H) 09/07/2021   BUN 14 09/07/2021   CREATININE 0.80 09/07/2021   CALCIUM 9.8 09/07/2021      RADIOGRAPHY: NM PET Image Restage (PS) Skull Base to Thigh (F-18 FDG)  Result Date: 09/18/2021 CLINICAL DATA:  Follow-up treatment strategy for non-small cell lung cancer. EXAM: NUCLEAR MEDICINE PET SKULL BASE TO THIGH TECHNIQUE: 0.6 mCi F-18 FDG was injected intravenously. Full-ring PET imaging was performed from the skull base to thigh after the radiotracer. CT data was obtained and used for attenuation correction and anatomic localization. Fasting blood glucose: 80 mg/dl COMPARISON:  Chest CT dated September 07, 2021 FINDINGS: Mediastinal blood pool activity: SUV max 2.4 Liver activity: SUV max 2.9 NECK: Hypermetabolic right supraclavicular lymph nodes, largest measuring 2.8 x 2.5 cm on series 4, image 38, SUV max of 8.5, central hypodensity which is likely due to necrosis, previously measured 2.3  x 1.6 cm. Subcentimeter left supraclavicular lymph node measuring 0.6 cm short axis on series 4, image 38, SUV max of 5.6, unchanged in size when compared with prior exam. Incidental CT findings: None. CHEST: Hypermetabolic mediastinal and right hilar lymph nodes. Reference right upper paratracheal lymph node measuring 1.9 x 1.9 cm on series 4, image 43, SUV max of 9.6, not significantly changed when compared with prior exam and remeasured in similar plane. Reference right hilar lymph node measuring 1.0 cm in short axis on series 4, image 62, with SUV  max of 6.1, unchanged in size when compared to prior exam. Incidental CT findings: Mild coronary artery calcifications of the LAD. Moderate centrilobular emphysema. Right upper lobe linear opacities, unchanged when compared with prior exam and likely postradiation change, mild diffuse uptake which is less than mediastinal blood pool. ABDOMEN/PELVIS: No abnormal hypermetabolic activity within the liver, pancreas, adrenal glands, or spleen. No hypermetabolic lymph nodes in the abdomen or pelvis. Incidental CT findings: Cholecystectomy clips. Atherosclerotic disease of the abdominal aorta. Hyperdense lesion of the anterior right kidney measuring 1.0 cm on series 4, image 107, with no FDG uptake, unchanged when compared to prior and likely a proteinaceous cyst. SKELETON: No focal hypermetabolic activity to suggest skeletal metastasis. Relative decreased FDG uptake is seen in the thoracic spine, likely postradiation change. Incidental CT findings: None. IMPRESSION: 1. Hypermetabolic mediastinal, right hilar and bilateral supraclavicular lymph nodes, compared with most recent prior chest CT dated September 07, 2021 there has been interval increased size of the largest right supraclavicular lymph node with evidence of central necrosis. 2. Right upper lobe postradiation change. 3. No evidence of metastatic disease in the abdomen or pelvis. Electronically Signed   By: Yetta Glassman M.D.   On: 09/18/2021 09:53   CT Chest W Contrast  Result Date: 09/08/2021 CLINICAL DATA:  Lung cancer restaging, status post chemotherapy and radiation * Tracking Code: BO * EXAM: CT CHEST WITH CONTRAST TECHNIQUE: Multidetector CT imaging of the chest was performed during intravenous contrast administration. RADIATION DOSE REDUCTION: This exam was performed according to the departmental dose-optimization program which includes automated exposure control, adjustment of the mA and/or kV according to patient size and/or use of iterative reconstruction technique. CONTRAST:  69m OMNIPAQUE IOHEXOL 300 MG/ML  SOLN COMPARISON:  05/27/2021, 03/12/2021 FINDINGS: Cardiovascular: No significant vascular findings. Normal heart size. Scattered left coronary artery calcifications. No pericardial effusion. Mediastinum/Nodes: Significant interval enlargement low right anterior cervical and right superior mediastinal lymph nodes, measuring up to 2.3 x 1.6 cm, previously 1.5 x 0.9 cm (series 2, image 14) and 2.3 x 2.0 cm, previously 1.2 x 0.9 cm (series 2, image 25). Interval enlargement of matted appearing right paratracheal nodes measuring up to 2.2 x 1.4 cm, previously not discretely visualized (series 2, image 56). Thyroid gland, trachea, and esophagus demonstrate no significant findings. Lungs/Pleura: Moderate centrilobular and paraseptal emphysema. Improved heterogeneous airspace disease and consolidation throughout the right upper lobe, with bandlike residual scarring fibrosis obscuring a previously seen spiculated nodule of the peripheral right upper lobe (series 5, image 51). No pleural effusion or pneumothorax. Upper Abdomen: No acute abnormality. Musculoskeletal: No chest wall abnormality. No acute osseous findings. IMPRESSION: 1. Significant enlargement of mediastinal and low right cervical lymph nodes, consistent with worsened nodal metastatic disease. 2. 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 3. Emphysema. 4. Coronary artery disease. Emphysema (ICD10-J43.9). Electronically Signed   By: ADelanna AhmadiM.D.   On: 09/08/2021 06:45      IMPRESSION: Recurrent locally advanced non-small cell lung cancer initially diagnosed as stage IIIB  (T1b, N3, M0) non-small cell lung cancer, adenocarcinoma she presented with right upper lobe nodule in addition to bulky right hilar, mediastinal, and left supraclavicular lymphadenopathy diagnosed in November 2022.  The patient had evidence for disease recurrence in the mediastinal and right supraclavicular lymphadenopathy in August 2023.  ***  Today, I talked to the patient and family about the findings and work-up thus far.  We discussed the natural history of *** and general treatment, highlighting the role of radiotherapy in the management.  We discussed the available radiation techniques, and focused on the details of logistics and delivery.  We reviewed the anticipated acute and late sequelae associated with radiation in this setting.  The patient was encouraged to ask questions that I answered to the best of my ability. *** A patient consent form was discussed and signed.  We retained a copy for our records.  The patient would like to proceed with radiation and will be scheduled for CT simulation.  PLAN: ***    *** minutes of total time was spent for this patient encounter, including preparation, face-to-face counseling with the patient and coordination of care, physical exam, and documentation of the encounter.   ------------------------------------------------  Blair Promise, PhD, MD  This document serves as a record of services personally performed by Gery Pray, MD. It was created on his behalf  by Roney Mans, a trained medical scribe. The creation of this record is based on the scribe's personal observations and the provider's statements to them. This document has been checked and approved by the attending provider.

## 2021-09-28 ENCOUNTER — Encounter: Payer: Self-pay | Admitting: Radiation Oncology

## 2021-09-28 ENCOUNTER — Other Ambulatory Visit: Payer: Self-pay

## 2021-09-28 ENCOUNTER — Ambulatory Visit
Admission: RE | Admit: 2021-09-28 | Discharge: 2021-09-28 | Disposition: A | Discharge status: Home / Self Care | Payer: Commercial Managed Care - PPO | Source: Ambulatory Visit | Attending: Radiation Oncology | Admitting: Radiation Oncology

## 2021-09-28 VITALS — BP 128/71 | HR 95 | Temp 97.8°F | Resp 18 | Ht 62.0 in | Wt 114.6 lb

## 2021-09-28 DIAGNOSIS — J432 Centrilobular emphysema: Secondary | ICD-10-CM | POA: Diagnosis not present

## 2021-09-28 DIAGNOSIS — Z79899 Other long term (current) drug therapy: Secondary | ICD-10-CM | POA: Diagnosis not present

## 2021-09-28 DIAGNOSIS — Z7982 Long term (current) use of aspirin: Secondary | ICD-10-CM | POA: Insufficient documentation

## 2021-09-28 DIAGNOSIS — K219 Gastro-esophageal reflux disease without esophagitis: Secondary | ICD-10-CM | POA: Insufficient documentation

## 2021-09-28 DIAGNOSIS — Z923 Personal history of irradiation: Secondary | ICD-10-CM | POA: Insufficient documentation

## 2021-09-28 DIAGNOSIS — E7800 Pure hypercholesterolemia, unspecified: Secondary | ICD-10-CM | POA: Diagnosis not present

## 2021-09-28 DIAGNOSIS — I700 Atherosclerosis of aorta: Secondary | ICD-10-CM | POA: Insufficient documentation

## 2021-09-28 DIAGNOSIS — C3411 Malignant neoplasm of upper lobe, right bronchus or lung: Secondary | ICD-10-CM | POA: Insufficient documentation

## 2021-09-28 DIAGNOSIS — C770 Secondary and unspecified malignant neoplasm of lymph nodes of head, face and neck: Secondary | ICD-10-CM | POA: Diagnosis present

## 2021-09-28 DIAGNOSIS — I2510 Atherosclerotic heart disease of native coronary artery without angina pectoris: Secondary | ICD-10-CM | POA: Insufficient documentation

## 2021-09-28 DIAGNOSIS — Z87891 Personal history of nicotine dependence: Secondary | ICD-10-CM | POA: Insufficient documentation

## 2021-09-29 ENCOUNTER — Encounter (HOSPITAL_COMMUNITY)
Admission: RE | Admit: 2021-09-29 | Discharge: 2021-09-29 | Disposition: A | Discharge status: Home / Self Care | Payer: Commercial Managed Care - PPO | Source: Ambulatory Visit | Attending: Emergency Medicine | Admitting: Emergency Medicine

## 2021-09-29 ENCOUNTER — Other Ambulatory Visit (HOSPITAL_COMMUNITY): Payer: Self-pay | Admitting: Physician Assistant

## 2021-09-29 ENCOUNTER — Other Ambulatory Visit: Payer: Self-pay

## 2021-09-29 DIAGNOSIS — J432 Centrilobular emphysema: Secondary | ICD-10-CM

## 2021-09-29 DIAGNOSIS — C3411 Malignant neoplasm of upper lobe, right bronchus or lung: Secondary | ICD-10-CM | POA: Diagnosis not present

## 2021-09-29 NOTE — Progress Notes (Signed)
Daily Session Note  Patient Details  Name: Julie Nicholson MRN: [id number] Date of Birth: Apr 06, 1965 Referring Provider:   April Manson Pulmonary Rehab Walk Test from 07/24/2021 in [place]  Referring Provider [name]       Encounter Date: 09/29/2021  Check In:  Session Check In - 09/29/21 1125       Check-In   Supervising physician immediately available to respond to emergencies [facility] immediately available    Physician(s) Dr. Florene Glen    Location [facility]    Staff Present Rosebud Poles, RN, BSN;Randi Olen Cordial BS, ACSM-CEP, Exercise Physiologist;Kaylee Rosana Hoes, MS, ACSM-CEP, Exercise Physiologist;Lisa Ysidro Evert, RN    Virtual Visit No    Medication changes reported     No    Fall or balance concerns reported    No    Tobacco Cessation No Change    Warm-up and Cool-down Performed as group-led instruction    Resistance Training Performed Yes    VAD Patient? No    PAD/SET Patient? No      Pain Assessment   Currently in Pain? No/denies    Multiple Pain Sites No             Capillary Blood Glucose: No results found for this or any previous visit (from the past 24 hour(s)).    Social History   Tobacco Use  Smoking Status Former   Packs/day: 0.50   Years: 41.00   Total pack years: 20.50   Types: Cigarettes   Quit date: 12/23/2020   Years since quitting: 0.7  Smokeless Tobacco Never    Goals Met:  Proper associated with RPD/PD & O2 Sat Exercise tolerated well No report of concerns or symptoms today Strength training completed today  Goals Unmet:  Not Applicable  Comments: Service time is from 1022 to 1142.    Dr. Rodman Pickle is Medical Director for Pulmonary Rehab at [facility].

## 2021-09-30 ENCOUNTER — Other Ambulatory Visit: Payer: Self-pay | Admitting: Radiology

## 2021-09-30 ENCOUNTER — Ambulatory Visit (HOSPITAL_COMMUNITY)
Admission: RE | Admit: 2021-09-30 | Discharge: 2021-09-30 | Disposition: A | Discharge status: Home / Self Care | Payer: Commercial Managed Care - PPO | Source: Ambulatory Visit | Attending: Internal Medicine | Admitting: Internal Medicine

## 2021-09-30 DIAGNOSIS — C3411 Malignant neoplasm of upper lobe, right bronchus or lung: Secondary | ICD-10-CM | POA: Diagnosis not present

## 2021-09-30 DIAGNOSIS — C3490 Malignant neoplasm of unspecified part of unspecified bronchus or lung: Secondary | ICD-10-CM | POA: Diagnosis present

## 2021-09-30 MED ORDER — GADOBUTROL 1 MMOL/ML IV SOLN
5.0000 mL | Freq: Once | INTRAVENOUS | Status: AC | PRN
Start: 2021-09-30 — End: 2021-09-30
  Administered 2021-09-30: 5 mL via INTRAVENOUS

## 2021-09-30 NOTE — Telephone Encounter (Signed)
Dr. Lamonte Sakai signed the form and I faxed to [place] - fax# [phone].   Mailed hard copy to patient.

## 2021-10-01 ENCOUNTER — Encounter (HOSPITAL_COMMUNITY): Payer: Commercial Managed Care - PPO

## 2021-10-01 ENCOUNTER — Other Ambulatory Visit: Payer: Self-pay

## 2021-10-01 ENCOUNTER — Ambulatory Visit (HOSPITAL_COMMUNITY)
Admission: RE | Admit: 2021-10-01 | Discharge: 2021-10-01 | Disposition: A | Discharge status: Home / Self Care | Payer: Commercial Managed Care - PPO | Source: Ambulatory Visit | Attending: Internal Medicine | Admitting: Internal Medicine

## 2021-10-01 DIAGNOSIS — C3490 Malignant neoplasm of unspecified part of unspecified bronchus or lung: Secondary | ICD-10-CM | POA: Diagnosis present

## 2021-10-01 MED ORDER — MIDAZOLAM HCL 2 MG/2ML IJ SOLN
INTRAMUSCULAR | Status: AC
  Filled 2021-10-01: qty 2

## 2021-10-01 MED ORDER — SODIUM CHLORIDE 0.9 % IV SOLN: INTRAVENOUS | Status: DC

## 2021-10-01 MED ORDER — MIDAZOLAM HCL 2 MG/2ML IJ SOLN
INTRAMUSCULAR | Status: AC | PRN
  Administered 2021-10-01 (×4): .5 mg via INTRAVENOUS

## 2021-10-01 MED ORDER — LIDOCAINE HCL (PF) 1 % IJ SOLN
INTRAMUSCULAR | Status: AC
  Filled 2021-10-01: qty 30

## 2021-10-01 NOTE — H&P (Signed)
Chief Complaint: Patient was seen in consultation today for lymph node biopsy   Referring Physician(s): Mohamed,Mohamed  Supervising Physician: Dr. Maryelizabeth Kaufmann  Patient Status: [facility] - Out-pt  History of Present Illness: Julie Nicholson is a 56 y.o. female with a medical history significant for non-small cell lung cancer first diagnosed November 2022. She initially presented to the ED with acute onset right-sided chest discomfort. Imaging showed a right upper lobe lung nodule and tissue sampling obtained by pulmonary confirmed non-small cell lung cancer. She received chemoradiation and immunotherapy. She was under observation until recent imaging showed suspected disease recurrence.   NM PET 09/16/21 IMPRESSION: 1. Hypermetabolic mediastinal, right hilar and bilateral supraclavicular lymph nodes, compared with most recent prior chest CT dated September 07, 2021 there has been interval increased size of the largest right supraclavicular lymph node with evidence of central necrosis. 2. Right upper lobe postradiation change. 3. No evidence of metastatic disease in the abdomen or pelvis.   Interventional Radiology has been asked to evaluate this patient for an image-guided lymph node biopsy for further work up. Imaging reviewed and procedure approved by Dr. Laurence Ferrari  Past Medical History:  Diagnosis Date   Abdominal discomfort    Cancer ([place])    Chronic headaches    due to allergies, sinus   COPD (chronic obstructive pulmonary disease) ([place])    per 2012 chest xray   pt states she doesn not have this now (04/10/2013)   Deviated nasal septum    Eustachian tube dysfunction    GERD (gastroesophageal reflux disease)    occasional uses Tums / Rolaids   Hearing loss    right ear   High cholesterol    History of radiation therapy    right lung 01/07/2021-02/19/2021  Dr Gery Pray   Migraine    "only once in a blue moon since RX'd allergy shots" (04/10/2013)   Pancreatitis 02/08/2013    Pneumonia    Rhinitis, allergic     Past Surgical History:  Procedure Laterality Date   ABDOMINAL HYSTERECTOMY  1995   tx endometriosis, both ovaries removed   APPENDECTOMY  late 1990's   BIOPSY  02/26/2021   Procedure: BIOPSY;  Surgeon: Milus Banister, MD;  Location: [facility];  Service: Endoscopy;;   BRONCHIAL BRUSHINGS  12/15/2020   Procedure: BRONCHIAL BRUSHINGS;  Surgeon: Collene Gobble, MD;  Location: [place];  Service: Cardiopulmonary;;   BRONCHIAL NEEDLE ASPIRATION BIOPSY  12/15/2020   Procedure: BRONCHIAL NEEDLE ASPIRATION BIOPSIES;  Surgeon: Collene Gobble, MD;  Location: [place];  Service: Cardiopulmonary;;   CHOLECYSTECTOMY  04/10/2013   CHOLECYSTECTOMY N/A 04/10/2013   Procedure: LAPAROSCOPIC CHOLECYSTECTOMY WITH INTRAOPERATIVE CHOLANGIOGRAM;  Surgeon: Odis Hollingshead, MD;  Location: [place];  Service: General;  Laterality: N/A;   ELECTROMAGNETIC NAVIGATION BROCHOSCOPY  12/15/2020   Procedure: ELECTROMAGNETIC NAVIGATION BRONCHOSCOPY;  Surgeon: Collene Gobble, MD;  [facility] END[facility];  Service: Cardiopulmonary;;   ESOPHAGOGASTRODUODENOSCOPY (EGD) WITH PROPOFOL N/A 02/26/2021   Procedure: ESOPHAGOGASTRODUODENOSCOPY (EGD) WITH PROPOFOL;  Surgeon: Milus Banister, MD;  Location: [facility];  Service: Endoscopy;  Laterality: N/A;   EUS N/A 02/16/2013   Procedure: UPPER ENDOSCOPIC ULTRASOUND (EUS) LINEAR;  Surgeon: Milus Banister, MD;  Location: [facility];  Service: Endoscopy;  Laterality: N/A;  radial linear   KNEE ARTHROSCOPY Right 1980's   "cartilage OR"   LAPAROSCOPIC ENDOMETRIOSIS FULGURATION  1980's   MYRINGOTOMY WITH TUBE PLACEMENT Right 07/13/2018   Procedure: MYRINGOTOMY WITH TUBE PLACEMENT;  Surgeon: Margaretha Sheffield, MD;  Location:  [place];  Service: ENT;  Laterality: Right;   MYRINGOTOMY WITH TUBE PLACEMENT Right 06/04/2021   Procedure: MYRINGOTOMY WITH BUTTERFLY TUBE PLACEMENT;  Surgeon: Margaretha Sheffield, MD;  Location: [place];  Service: ENT;  Laterality: Right;   NASOPHARYNGOSCOPY EUSTATION TUBE BALLOON DILATION Right 07/13/2018   Procedure: NASOPHARYNGOSCOPY EUSTATION TUBE BALLOON DILATION;  Surgeon: Margaretha Sheffield, MD;  Location: [place];  Service: ENT;  Laterality: Right;   TONSILLECTOMY AND ADENOIDECTOMY  ~ 1980   adenoidectomy   TUBAL LIGATION  ~ [place] Right 07/13/2018   Procedure: OUTFRACTURE TURBINATE;  Surgeon: Margaretha Sheffield, MD;  Location: [place];  Service: ENT;  Laterality: Right;   VIDEO BRONCHOSCOPY WITH ENDOBRONCHIAL ULTRASOUND N/A 12/15/2020   Procedure: ROBOTIC VIDEO BRONCHOSCOPY WITH ENDOBRONCHIAL ULTRASOUND;  Surgeon: Collene Gobble, MD;  Location: [place];  Service: Cardiopulmonary;  Laterality: N/A;   WRIST SURGERY Left    w/plate    Allergies: Benadryl [diphenhydramine]  Medications: Prior to Admission medications   Medication Sig Start Date End Date Taking? Authorizing Provider  acetaminophen (TYLENOL) 500 MG tablet Take 1,000 mg by mouth every 6 (six) hours as needed for moderate pain.   Yes [provider]  albuterol (PROVENTIL) (2.5 MG/3ML) 0.083% nebulizer solution albuterol sulfate 2.5 mg/3 mL (0.083 %) solution for nebulization  USE 1 VIAL IN NEBULIZER EVERY 6 HOURS AS NEEDED FOR WHEEZING OR SHORTNESS OF BREATH   Yes [provider]  aspirin EC 81 MG tablet Take 81 mg by mouth at bedtime.   Yes [provider]  atorvastatin (LIPITOR) 40 MG tablet Take 40 mg by mouth at bedtime.   Yes [provider]  benzonatate (TESSALON) 100 MG capsule Take 1-2 capsules (100-200 mg total) by mouth 3 (three) times daily as needed for cough. 05/25/21  Yes Mannam, Praveen, MD  Biotin 5000 MCG TABS Take 5,000 mcg by mouth at bedtime.   Yes [provider]  buPROPion [facility] SR) 150 MG 12 hr tablet Take 1 tablet by mouth twice daily 08/07/21  Yes Collene Gobble, MD  Fluticasone-Umeclidin-Vilant  (TRELEGY ELLIPTA) 200-62.5-25 MCG/ACT AEPB INHALE 1 PUFF ONCE DAILY 08/10/21  Yes Collene Gobble, MD  loratadine (CLARITIN) 10 MG tablet Take 1 tablet by mouth once daily 08/31/21  Yes [name], Rose Fillers, MD  Multiple Vitamins-Minerals (HAIR SKIN AND NAILS FORMULA PO) Take 1 tablet by mouth daily.   Yes [provider]  Oxycodone HCl 10 MG TABS Take 1 tablet by mouth 3 (three) times daily as needed (pain).   Yes [provider]  albuterol (VENTOLIN HFA) 108 (90 Base) MCG/ACT inhaler Inhale 1-2 puffs into the lungs every 6 (six) hours as needed for wheezing or shortness of breath. 05/08/21   Cobb, Karie Schwalbe, NP  Ca Carbonate-Mag Hydroxide (ROLAIDS PO) Take 1 tablet by mouth daily as needed (heartburn).    [provider]  fluticasone (FLONASE) 50 MCG/ACT nasal spray Place 2 sprays into both nostrils daily as needed for allergies.    [provider]  Tiotropium Bromide-Olodaterol (STIOLTO RESPIMAT) 2.5-2.5 MCG/ACT AERS Inhale 2 puffs into the lungs daily. 08/12/21   Collene Gobble, MD  UBRELVY 100 MG TABS Take 100 mg by mouth daily as needed (migraine). 11/06/20   [provider]     Family History  Problem Relation Age of Onset   Diabetes Father    Diabetes Mother    COPD Mother    COPD Maternal Grandfather  COPD Maternal Aunt    COPD Maternal Uncle     Social History   Socioeconomic History   Marital status: Married    Spouse name: Not on file   Number of children: Not on file   Years of education: 12   Highest education level: 12th grade  Occupational History   Not on file  Tobacco Use   Smoking status: Former    Packs/day: 0.50    Years: 41.00    Total pack years: 20.50    Types: Cigarettes    Quit date: 12/23/2020    Years since quitting: 0.7   Smokeless tobacco: Never  Vaping Use   Vaping Use: Never used  Substance and Sexual Activity   Alcohol use: Not Currently    Comment: occasional   Drug use: No   Sexual activity: Yes     Birth control/protection: Surgical    Comment: Hysterectomy  Other Topics Concern   Not on file  Social History Narrative   Not on file   Social Determinants of Health   Financial Resource Strain: Not on file  Food Insecurity: Not on file  Transportation Needs: Not on file  Physical Activity: Not on file  Stress: Not on file  Social Connections: Not on file    Review of Systems: A 12 point ROS discussed and pertinent positives are indicated in the HPI above.  All other systems are negative.  Review of Systems  Constitutional:  Positive for fatigue.  Respiratory:  Positive for shortness of breath. Negative for cough.   Cardiovascular:  Negative for chest pain and leg swelling.  Gastrointestinal:  Negative for abdominal pain, diarrhea, nausea and vomiting.  Neurological:  Negative for dizziness and headaches.    Vital Signs: BP 103/65 (BP Location: Left Arm)   Pulse 97   Resp 17   SpO2 99%   Physical Exam Constitutional:      General: She is not in acute distress.    Appearance: She is not ill-appearing.  HENT:     Mouth/Throat:     Mouth: Mucous membranes are moist.     Pharynx: Oropharynx is clear.  Cardiovascular:     Rate and Rhythm: Normal rate and regular rhythm.     Pulses: Normal pulses.     Heart sounds: Normal heart sounds.  Pulmonary:     Effort: Pulmonary effort is normal.     Breath sounds: Normal breath sounds.  Abdominal:     General: Bowel sounds are normal.     Palpations: Abdomen is soft.     Tenderness: There is no abdominal tenderness.  Musculoskeletal:     Right lower leg: No edema.     Left lower leg: No edema.  Skin:    General: Skin is warm and dry.  Neurological:     Mental Status: She is alert and oriented to person, place, and time.     Imaging: NM PET Image Restage (PS) Skull Base to Thigh (F-18 FDG)  Result Date: 09/18/2021 CLINICAL DATA:  Follow-up treatment strategy for non-small cell lung cancer. EXAM: NUCLEAR MEDICINE  PET SKULL BASE TO THIGH TECHNIQUE: 0.6 mCi F-18 FDG was injected intravenously. Full-ring PET imaging was performed from the skull base to thigh after the radiotracer. CT data was obtained and used for attenuation correction and anatomic localization. Fasting blood glucose: 80 mg/dl COMPARISON:  Chest CT dated September 07, 2021 FINDINGS: Mediastinal blood pool activity: SUV max 2.4 Liver activity: SUV max 2.9 NECK: Hypermetabolic right supraclavicular lymph nodes,  largest measuring 2.8 x 2.5 cm on series 4, image 38, SUV max of 8.5, central hypodensity which is likely due to necrosis, previously measured 2.3 x 1.6 cm. Subcentimeter left supraclavicular lymph node measuring 0.6 cm short axis on series 4, image 38, SUV max of 5.6, unchanged in size when compared with prior exam. Incidental CT findings: None. CHEST: Hypermetabolic mediastinal and right hilar lymph nodes. Reference right upper paratracheal lymph node measuring 1.9 x 1.9 cm on series 4, image 43, SUV max of 9.6, not significantly changed when compared with prior exam and remeasured in similar plane. Reference right hilar lymph node measuring 1.0 cm in short axis on series 4, image 62, with SUV max of 6.1, unchanged in size when compared to prior exam. Incidental CT findings: Mild coronary artery calcifications of the LAD. Moderate centrilobular emphysema. Right upper lobe linear opacities, unchanged when compared with prior exam and likely postradiation change, mild diffuse uptake which is less than mediastinal blood pool. ABDOMEN/PELVIS: No abnormal hypermetabolic activity within the liver, pancreas, adrenal glands, or spleen. No hypermetabolic lymph nodes in the abdomen or pelvis. Incidental CT findings: Cholecystectomy clips. Atherosclerotic disease of the abdominal aorta. Hyperdense lesion of the anterior right kidney measuring 1.0 cm on series 4, image 107, with no FDG uptake, unchanged when compared to prior and likely a proteinaceous cyst. SKELETON:  No focal hypermetabolic activity to suggest skeletal metastasis. Relative decreased FDG uptake is seen in the thoracic spine, likely postradiation change. Incidental CT findings: None. IMPRESSION: 1. Hypermetabolic mediastinal, right hilar and bilateral supraclavicular lymph nodes, compared with most recent prior chest CT dated September 07, 2021 there has been interval increased size of the largest right supraclavicular lymph node with evidence of central necrosis. 2. Right upper lobe postradiation change. 3. No evidence of metastatic disease in the abdomen or pelvis. Electronically Signed   By: Yetta Glassman M.D.   On: 09/18/2021 09:53   CT Chest W Contrast  Result Date: 09/08/2021 CLINICAL DATA:  Lung cancer restaging, status post chemotherapy and radiation * Tracking Code: BO * EXAM: CT CHEST WITH CONTRAST TECHNIQUE: Multidetector CT imaging of the chest was performed during intravenous contrast administration. RADIATION DOSE REDUCTION: This exam was performed according to the departmental dose-optimization program which includes automated exposure control, adjustment of the mA and/or kV according to patient size and/or use of iterative reconstruction technique. CONTRAST:  40mL OMNIPAQUE IOHEXOL 300 MG/ML  SOLN COMPARISON:  05/27/2021, 03/12/2021 FINDINGS: Cardiovascular: No significant vascular findings. Normal heart size. Scattered left coronary artery calcifications. No pericardial effusion. Mediastinum/Nodes: Significant interval enlargement low right anterior cervical and right superior mediastinal lymph nodes, measuring up to 2.3 x 1.6 cm, previously 1.5 x 0.9 cm (series 2, image 14) and 2.3 x 2.0 cm, previously 1.2 x 0.9 cm (series 2, image 25). Interval enlargement of matted appearing right paratracheal nodes measuring up to 2.2 x 1.4 cm, previously not discretely visualized (series 2, image 56). Thyroid gland, trachea, and esophagus demonstrate no significant findings. Lungs/Pleura: Moderate  centrilobular and paraseptal emphysema. Improved heterogeneous airspace disease and consolidation throughout the right upper lobe, with bandlike residual scarring fibrosis obscuring a previously seen spiculated nodule of the peripheral right upper lobe (series 5, image 51). No pleural effusion or pneumothorax. Upper Abdomen: No acute abnormality. Musculoskeletal: No chest wall abnormality. No acute osseous findings. IMPRESSION: 1. Significant enlargement of mediastinal and low right cervical lymph nodes, consistent with worsened nodal metastatic disease. 2. 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 3. Emphysema. 4. Coronary artery disease. Emphysema (ICD10-J43.9). Electronically Signed   By: Delanna Ahmadi M.D.   On: 09/08/2021 06:45    Labs:  CBC: Recent Labs    04/22/21 0850 05/20/21 0931 06/17/21 1100 09/07/21 1058  WBC 3.6* 4.1 4.3 10.8*  HGB 11.4* 12.4 12.1 14.5  HCT 34.7* 36.1 36.6 41.5  PLT 212 193 273 244    COAGS: No results for input(s): "INR", "APTT" in the last 8760 hours.  BMP: Recent Labs    04/22/21 0850 05/20/21 0931 06/17/21 1100 09/07/21 1058  NA 139 141 142 141  K 4.1 3.9 3.6 3.2*  CL 104 105 106 104  CO2 25 29 26 30   GLUCOSE 91 84 110* 102*  BUN 14 7 10 14   CALCIUM 9.9 9.7 9.5 9.8  CREATININE 0.74 0.78 0.81 0.80  GFRNONAA >60 >60 >60 >60    LIVER FUNCTION TESTS: Recent Labs    04/22/21 0850 05/20/21 0931 06/17/21 1100 09/07/21 1058  BILITOT 0.5 0.5 0.5 0.6  AST 15 14* 17 14*  ALT 12 11 18 17   ALKPHOS 120 111 161* 119  PROT 7.5 7.3 7.4 8.3*  ALBUMIN 3.8 3.9 3.8 4.7    TUMOR MARKERS: No results for input(s): "AFPTM", "CEA", "CA199", "CHROMGRNA" in the last 8760 hours.  Assessment and  Plan:  Non-small cell lung cancer with suspected disease recurrence/lymphadenopathy: [place]. Nicholson, 56 year old female, presents today to the [facility] department for an image-guided lymph node biopsy. The patient elected to have this procedure done with local anesthesia and one drug for anxiety relief.    Risks and benefits of this procedure were discussed with the patient and/or patient's family including, but not limited to bleeding, infection, damage to adjacent structures or low yield requiring additional tests.  All of the questions were answered and there is agreement to proceed.  Consent signed and in chart.  Thank you for this interesting consult.  I greatly enjoyed meeting Julie Nicholson and look forward to participating in their care.  A copy of this report was sent to the requesting provider on this date.  Electronically Signed: Soyla Dryer, AGACNP-BC [phone] 10/01/2021, 10:25 AM   I spent a total of  30 Minutes   in face to face in clinical consultation, greater than 50% of which was counseling/coordinating care for lymph node biopsy

## 2021-10-01 NOTE — Sedation Documentation (Signed)
Patient had muscle spasms prior to the procedure starting that intensified during the procedure with Versed on board.  Patient was having involuntary movements in bilateral arms that she could not control.

## 2021-10-01 NOTE — Procedures (Signed)
Vascular and Interventional Radiology Procedure Note  Patient: Julie Nicholson DOB: 18-Jun-1965 Medical Record Number: [id number] Note Date/Time: 10/01/21 10:36 AM   Performing Physician: Michaelle Birks, MD Assistant(s): None  Diagnosis: Hx of NSCLC  Procedure: RIGHT SUPRACLAVICULAR LYMPH NODE BIOPSY  Anesthesia: Local Anesthetic Complications: None Estimated Blood Loss: Minimal Specimens: Sent for Pathology  Findings:  Successful Ultrasound-guided biopsy of R supraclavicular LN. A total of 2 samples were obtained. *Pt was restless for the procedure, and given proximity to RCCA and RIJV, additional samples could not be obtained. Hemostasis of the tract was achieved using Manual Pressure.  Plan: Bed rest for 1 hours.  See detailed procedure note with images in PACS. The patient tolerated the procedure well without incident or complication and was returned to Recovery in stable condition.    Michaelle Birks, MD [facility]   Pager. [place]

## 2021-10-02 LAB — SURGICAL PATHOLOGY

## 2021-10-06 ENCOUNTER — Encounter (HOSPITAL_COMMUNITY)
Admission: RE | Admit: 2021-10-06 | Discharge: 2021-10-06 | Disposition: A | Discharge status: Home / Self Care | Payer: Commercial Managed Care - PPO | Source: Ambulatory Visit | Attending: Emergency Medicine | Admitting: Emergency Medicine

## 2021-10-06 VITALS — Wt 113.1 lb

## 2021-10-06 DIAGNOSIS — R1310 Dysphagia, unspecified: Secondary | ICD-10-CM | POA: Diagnosis not present

## 2021-10-06 DIAGNOSIS — Z510 Encounter for antineoplastic radiation therapy: Secondary | ICD-10-CM | POA: Diagnosis not present

## 2021-10-06 DIAGNOSIS — J432 Centrilobular emphysema: Secondary | ICD-10-CM | POA: Insufficient documentation

## 2021-10-06 DIAGNOSIS — C3411 Malignant neoplasm of upper lobe, right bronchus or lung: Secondary | ICD-10-CM | POA: Insufficient documentation

## 2021-10-06 NOTE — Progress Notes (Signed)
Daily Session Note  Patient Details  Name: Julie Nicholson MRN: [id number] Date of Birth: April 28, 1965 Referring Provider:   April Manson Pulmonary Rehab Walk Test from 07/24/2021 in [place]  Referring Provider [name]       Encounter Date: 10/06/2021  Check In:  Session Check In - 10/06/21 1123       Check-In   Supervising physician immediately available to respond to emergencies [facility] immediately available    Physician(s) Dr. Verlon Au    Location [facility]    Staff Present Rosebud Poles, RN, BSN;Ketrina Boateng Olen Cordial BS, ACSM-CEP, Exercise Physiologist;Kaylee Rosana Hoes, MS, ACSM-CEP, Exercise Physiologist;Lisa Ysidro Evert, RN    Virtual Visit No    Medication changes reported     No    Fall or balance concerns reported    No    Tobacco Cessation No Change    Warm-up and Cool-down Performed as group-led instruction    Resistance Training Performed Yes    VAD Patient? No    PAD/SET Patient? No      Pain Assessment   Currently in Pain? No/denies    Multiple Pain Sites No             Capillary Blood Glucose: No results found for this or any previous visit (from the past 24 hour(s)).   Exercise Prescription Changes - 10/06/21 1200       Response to Exercise   Blood Pressure (Admit) 104/60    Blood Pressure (Exercise) 114/70    Blood Pressure (Exit) 90/64    Heart Rate (Admit) 110 bpm    Heart Rate (Exercise) 123 bpm    Heart Rate (Exit) 106 bpm    Oxygen Saturation (Admit) 96 %    Oxygen Saturation (Exercise) 98 %    Oxygen Saturation (Exit) 96 %    Rating of Perceived Exertion (Exercise) 13    Perceived Dyspnea (Exercise) 1    Duration Continue with 30 min of aerobic exercise without signs/symptoms of physical distress.    Intensity THRR unchanged      Progression   Progression Continue to progress workloads to maintain intensity without signs/symptoms of physical distress.      Resistance Training   Training  Prescription Yes    Weight Blue bands    Reps 10-15    Time 10 Minutes      Treadmill   MPH 2.5    Grade 1    Minutes 15    METs 3.26      NuStep   Level 4    SPM 80    Minutes 15    METs 4             Social History   Tobacco Use  Smoking Status Former   Packs/day: 0.50   Years: 41.00   Total pack years: 20.50   Types: Cigarettes   Quit date: 12/23/2020   Years since quitting: 0.7  Smokeless Tobacco Never    Goals Met:  Independence with exercise equipment Exercise tolerated well No report of concerns or symptoms today Strength training completed today  Goals Unmet:  Not Applicable  Comments: Service time is from 1020 to 1139.    Dr. Rodman Pickle is Medical Director for Pulmonary Rehab at [facility].

## 2021-10-07 ENCOUNTER — Ambulatory Visit
Admission: RE | Admit: 2021-10-07 | Discharge: 2021-10-07 | Disposition: A | Discharge status: Home / Self Care | Payer: Commercial Managed Care - PPO | Source: Ambulatory Visit | Attending: Radiation Oncology | Admitting: Radiation Oncology

## 2021-10-07 ENCOUNTER — Inpatient Hospital Stay: Payer: Commercial Managed Care - PPO | Attending: Internal Medicine | Admitting: Internal Medicine

## 2021-10-07 ENCOUNTER — Other Ambulatory Visit: Payer: Self-pay

## 2021-10-07 VITALS — BP 127/78 | HR 103 | Temp 98.2°F | Resp 15 | Wt 114.7 lb

## 2021-10-07 DIAGNOSIS — Z510 Encounter for antineoplastic radiation therapy: Secondary | ICD-10-CM | POA: Diagnosis not present

## 2021-10-07 DIAGNOSIS — C3490 Malignant neoplasm of unspecified part of unspecified bronchus or lung: Secondary | ICD-10-CM | POA: Diagnosis not present

## 2021-10-07 DIAGNOSIS — C3411 Malignant neoplasm of upper lobe, right bronchus or lung: Secondary | ICD-10-CM | POA: Insufficient documentation

## 2021-10-07 LAB — RAD ONC ARIA SESSION SUMMARY
Course Elapsed Days: 0
Plan Fractions Treated to Date: 1
Plan Prescribed Dose Per Fraction: 2 Gy
Plan Total Fractions Prescribed: 15
Plan Total Prescribed Dose: 30 Gy
Reference Point Dosage Given to Date: 2 Gy
Reference Point Session Dosage Given: 2 Gy
Session Number: 1

## 2021-10-07 NOTE — Progress Notes (Signed)
Julie Nicholson [phone]   [phone]  OFFICE PROGRESS NOTE  [name], NP [place] [address]  DIAGNOSIS: Recurrent locally advanced non-small cell lung cancer initially diagnosed as stage IIIB  (T1b, N3, M0) non-small cell lung cancer, adenocarcinoma she presented with right upper lobe nodule in addition to bulky right hilar, mediastinal, and left supraclavicular lymphadenopathy diagnosed in November 2022.  The patient had evidence for disease recurrence in the mediastinal and right supraclavicular lymphadenopathy in August 2023.  DETECTED ALTERATION(S) / BIOMARKER(S) % CFDNA OR AMPLIFICATION ASSOCIATED FDA-APPROVED THERAPIES CLINICAL TRIAL AVAILABILITY [id number] ND 0.5 5 50 100 4.7%  [id number] ND 0.5 5 50 100 1.8%  [id number] ND 0.5 5 50 100 1.9%  BIOMARKER ADDITIONAL DETAILS Tumor Mutational Burden (TMB) 19.02 mut/Mb MSI Status Stable (MSS) PD-L1 Tumor Proportion Score (TPS)* <1%   PRIOR THERAPY:  1) Concurrent chemoradiation with carboplatin for an AUC of 2 and paclitaxel 45 mg per metered squared.  First dose on 01/05/2021.  Status post 7 cycles of treatment.  Last dose was given February 16, 2021. 2) Consolidation immunotherapy with Imfinzi 1500 Mg IV every 4 weeks.  First dose March 25, 2021.  Status post 3 cycles.  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   CURRENT THERAPY: Palliative radiotherapy to the enlarging right supraclavicular lymphadenopathy under the care of Dr. Sondra Come expected to be completed on October 28, 2021.  INTERVAL HISTORY: Julie Nicholson 56 y.o. female returns to the clinic today for follow-up visit accompanied by her daughter [name] and her son Thedore Mins was available by phone.  The patient is feeling fine today with no concerning complaints except for the swelling in her neck area from the lymphadenopathy as well as dry cough.  She denied  having any significant shortness of breath or hemoptysis.  She denied having any nausea, vomiting, diarrhea or constipation.  She has no headache or visual changes.  She had MRI of the brain as well as ultrasound-guided core biopsy of the right supraclavicular lymph nodes performed recently and she is here for evaluation and discussion of her biopsy results and treatment options.  MEDICAL HISTORY: Past Medical History:  Diagnosis Date   Abdominal discomfort    Cancer ([place])    Chronic headaches    due to allergies, sinus   COPD (chronic obstructive pulmonary disease) ([place])    per 2012 chest xray   pt states she doesn not have this now (04/10/2013)   Deviated nasal septum    Eustachian tube dysfunction    GERD (gastroesophageal reflux disease)    occasional uses Tums / Rolaids   Hearing loss    right ear   High cholesterol    History of radiation therapy    right lung 01/07/2021-02/19/2021  Dr Gery Pray   Migraine    "only once in a blue moon since RX'd allergy shots" (04/10/2013)   Pancreatitis 02/08/2013   Pneumonia    Rhinitis, allergic     ALLERGIES:  is allergic to benadryl [diphenhydramine].  MEDICATIONS:  Current Outpatient Medications  Medication Sig Dispense Refill   acetaminophen (TYLENOL) 500 MG tablet Take 1,000 mg by mouth every 6 (six) hours as needed for moderate pain.     albuterol (PROVENTIL) (2.5 MG/3ML) 0.083% nebulizer solution albuterol sulfate 2.5 mg/3 mL (0.083 %) solution for nebulization  USE 1 VIAL IN NEBULIZER EVERY 6 HOURS AS NEEDED FOR WHEEZING OR SHORTNESS OF BREATH     albuterol (  VENTOLIN HFA) 108 (90 Base) MCG/ACT inhaler Inhale 1-2 puffs into the lungs every 6 (six) hours as needed for wheezing or shortness of breath. 6.7 g 5   aspirin EC 81 MG tablet Take 81 mg by mouth at bedtime.     atorvastatin (LIPITOR) 40 MG tablet Take 40 mg by mouth at bedtime.     benzonatate (TESSALON) 100 MG capsule Take 1-2 capsules (100-200 mg total) by mouth 3 (three)  times daily as needed for cough. 30 capsule 0   Biotin 5000 MCG TABS Take 5,000 mcg by mouth at bedtime.     buPROPion (WELLBUTRIN SR) 150 MG 12 hr tablet Take 1 tablet by mouth twice daily 60 tablet 0   Ca Carbonate-Mag Hydroxide (ROLAIDS PO) Take 1 tablet by mouth daily as needed (heartburn).     fluticasone (FLONASE) 50 MCG/ACT nasal spray Place 2 sprays into both nostrils daily as needed for allergies.     Fluticasone-Umeclidin-Vilant (TRELEGY ELLIPTA) 200-62.5-25 MCG/ACT AEPB INHALE 1 PUFF ONCE DAILY 60 each 2   loratadine (CLARITIN) 10 MG tablet Take 1 tablet by mouth once daily 30 tablet 11   Multiple Vitamins-Minerals (HAIR SKIN AND NAILS FORMULA PO) Take 1 tablet by mouth daily.     Oxycodone HCl 10 MG TABS Take 1 tablet by mouth 3 (three) times daily as needed (pain).     Tiotropium Bromide-Olodaterol (STIOLTO RESPIMAT) 2.5-2.5 MCG/ACT AERS Inhale 2 puffs into the lungs daily. 4 g 3   UBRELVY 100 MG TABS Take 100 mg by mouth daily as needed (migraine).     No current facility-administered medications for this visit.    SURGICAL HISTORY:  Past Surgical History:  Procedure Laterality Date   ABDOMINAL HYSTERECTOMY  1995   tx endometriosis, both ovaries removed   APPENDECTOMY  late 1990's   BIOPSY  02/26/2021   Procedure: BIOPSY;  Surgeon: Milus Banister, MD;  Location: [facility];  Service: Endoscopy;;   BRONCHIAL BRUSHINGS  12/15/2020   Procedure: BRONCHIAL BRUSHINGS;  Surgeon: Collene Gobble, MD;  Location: [place];  Service: Cardiopulmonary;;   BRONCHIAL NEEDLE ASPIRATION BIOPSY  12/15/2020   Procedure: BRONCHIAL NEEDLE ASPIRATION BIOPSIES;  Surgeon: Collene Gobble, MD;  Location: [place];  Service: Cardiopulmonary;;   CHOLECYSTECTOMY  04/10/2013   CHOLECYSTECTOMY N/A 04/10/2013   Procedure: LAPAROSCOPIC CHOLECYSTECTOMY WITH INTRAOPERATIVE CHOLANGIOGRAM;  Surgeon: Odis Hollingshead, MD;  Location: [place];  Service: General;  Laterality: N/A;   ELECTROMAGNETIC  NAVIGATION BROCHOSCOPY  12/15/2020   Procedure: ELECTROMAGNETIC NAVIGATION BRONCHOSCOPY;  Surgeon: Collene Gobble, MD;  Location: [facility];  Service: Cardiopulmonary;;   ESOPHAGOGASTRODUODENOSCOPY (EGD) WITH PROPOFOL N/A 02/26/2021   Procedure: ESOPHAGOGASTRODUODENOSCOPY (EGD) WITH PROPOFOL;  Surgeon: Milus Banister, MD;  Location: [facility];  Service: Endoscopy;  Laterality: N/A;   EUS N/A 02/16/2013   Procedure: UPPER ENDOSCOPIC ULTRASOUND (EUS) LINEAR;  Surgeon: Milus Banister, MD;  Location: [facility];  Service: Endoscopy;  Laterality: N/A;  radial linear   KNEE ARTHROSCOPY Right 1980's   "cartilage OR"   LAPAROSCOPIC ENDOMETRIOSIS FULGURATION  1980's   MYRINGOTOMY WITH TUBE PLACEMENT Right 07/13/2018   Procedure: MYRINGOTOMY WITH TUBE PLACEMENT;  Surgeon: Margaretha Sheffield, MD;  Location: [place];  Service: ENT;  Laterality: Right;   MYRINGOTOMY WITH TUBE PLACEMENT Right 06/04/2021   Procedure: MYRINGOTOMY WITH BUTTERFLY TUBE PLACEMENT;  Surgeon: Margaretha Sheffield, MD;  Location: [place];  Service: ENT;  Laterality: Right;   NASOPHARYNGOSCOPY EUSTATION TUBE BALLOON DILATION Right 07/13/2018   Procedure:  NASOPHARYNGOSCOPY EUSTATION TUBE BALLOON DILATION;  Surgeon: Margaretha Sheffield, MD;  Location: [place];  Service: ENT;  Laterality: Right;   TONSILLECTOMY AND ADENOIDECTOMY  ~ 1980   adenoidectomy   TUBAL LIGATION  ~ [place] Right 07/13/2018   Procedure: OUTFRACTURE TURBINATE;  Surgeon: Margaretha Sheffield, MD;  Location: [place];  Service: ENT;  Laterality: Right;   VIDEO BRONCHOSCOPY WITH ENDOBRONCHIAL ULTRASOUND N/A 12/15/2020   Procedure: ROBOTIC VIDEO BRONCHOSCOPY WITH ENDOBRONCHIAL ULTRASOUND;  Surgeon: Collene Gobble, MD;  Location: [place];  Service: Cardiopulmonary;  Laterality: N/A;   WRIST SURGERY Left    w/plate    REVIEW OF SYSTEMS:  Constitutional: positive for fatigue Eyes: negative Ears, nose, mouth,  throat, and face: negative Respiratory: positive for cough Cardiovascular: negative Gastrointestinal: negative Genitourinary:negative Integument/breast: negative Hematologic/lymphatic: negative Musculoskeletal:negative Neurological: negative Behavioral/Psych: positive for anxiety Endocrine: negative Allergic/Immunologic: negative   PHYSICAL EXAMINATION: General appearance: alert, cooperative, fatigued, and no distress Head: Normocephalic, without obvious abnormality, atraumatic Neck: moderate anterior cervical adenopathy, no JVD, supple, symmetrical, trachea midline, and thyroid not enlarged, symmetric, no tenderness/mass/nodules Lymph nodes: Cervical adenopathy: Right and Supraclavicular adenopathy: Right Resp: clear to auscultation bilaterally Back: symmetric, no curvature. ROM normal. No CVA tenderness. Cardio: Tachycardic GI: soft, non-tender; bowel sounds normal; no masses,  no organomegaly Extremities: extremities normal, atraumatic, no cyanosis or edema Neurologic: Alert and oriented X 3, normal strength and tone. Normal symmetric reflexes. Normal coordination and gait  ECOG PERFORMANCE STATUS: 1 - Symptomatic but completely ambulatory  Blood pressure 127/78, pulse (!) 103, temperature 98.2 F (36.8 C), temperature source Oral, resp. rate 15, weight 114 lb 11.2 oz (52 kg), SpO2 99 %.  LABORATORY DATA: Lab Results  Component Value Date   WBC 10.8 (H) 09/07/2021   HGB 14.5 09/07/2021   HCT 41.5 09/07/2021   MCV 94.7 09/07/2021   PLT 244 09/07/2021      Chemistry      Component Value Date/Time   NA 141 09/07/2021 1058   K 3.2 (L) 09/07/2021 1058   CL 104 09/07/2021 1058   CO2 30 09/07/2021 1058   BUN 14 09/07/2021 1058   CREATININE 0.80 09/07/2021 1058      Component Value Date/Time   CALCIUM 9.8 09/07/2021 1058   ALKPHOS 119 09/07/2021 1058   AST 14 (L) 09/07/2021 1058   ALT 17 09/07/2021 1058   BILITOT 0.6 09/07/2021 1058       RADIOGRAPHIC  STUDIES: MR BRAIN W WO CONTRAST  Result Date: 10/01/2021 CLINICAL DATA:  Non-small cell lung cancer (NSCLC), staging EXAM: MRI HEAD WITHOUT AND WITH CONTRAST TECHNIQUE: Multiplanar, multiecho pulse sequences of the brain and surrounding structures were obtained without and with intravenous contrast. CONTRAST:  3m GADAVIST GADOBUTROL 1 MMOL/ML IV SOLN COMPARISON:  12/17/2020 FINDINGS: Brain: There is no acute infarction or intracranial hemorrhage. There is no intracranial mass, mass effect, or edema. There is no hydrocephalus or extra-axial fluid collection. Ventricles and sulci are normal in size and configuration. Few scattered small foci of T2 hyperintensity in the supratentorial white matter probably reflect similar minor nonspecific gliosis/demyelination. Small right cerebellar developmental venous anomaly. No new abnormal enhancement. Vascular: Major vessel flow voids at the skull base are preserved. Skull and upper cervical spine: Normal marrow signal is preserved. Sinuses/Orbits: Paranasal sinuses are aerated. Orbits are unremarkable. Other: Sella is unremarkable.  Mastoid air cells are clear. IMPRESSION: No evidence of intracranial metastatic disease. Electronically Signed   By: PMacy MisM.D.   On:  10/01/2021 12:54   [place] CORE BIOPSY (LYMPH NODES)  Result Date: 10/01/2021 INDICATION: History of small cell lung cancer. Hypermetabolic RIGHT supraclavicular lymph nodes on PET. EXAM: ULTRASOUND-GUIDED RIGHT CERVICAL/SUPRACLAVICULAR LYMPH NODE BIOPSY COMPARISON:  PET-CT, 09/16/2021.  CT chest, 09/07/2021. MEDICATIONS: None ANESTHESIA/SEDATION: Local anesthetic and single agent sedation was employed during this procedure. A total of Versed 2 mg was administered intravenously. The patient's level of consciousness and vital signs were monitored continuously by radiology nursing throughout the procedure under my direct supervision. COMPLICATIONS: None immediate. TECHNIQUE: Informed written consent was  obtained from the patient after a discussion of the risks, benefits and alternatives to treatment. Questions regarding the procedure were encouraged and answered. Initial ultrasound scanning demonstrated pathologically-enlarged RIGHT supraclavicular/cervical lymph node. An ultrasound image was saved for documentation purposes. The procedure was planned. A timeout was performed prior to the initiation of the procedure. The operative was prepped and draped in the usual sterile fashion, and a sterile drape was applied covering the operative field. A timeout was performed prior to the initiation of the procedure. Local anesthesia was provided with 1% lidocaine with epinephrine. Under direct ultrasound guidance, an 18 gauge core needle device was utilized to obtain to obtain 3 core needle biopsies of the RIGHT cervical lymph nodes. The samples were placed in saline and submitted to pathology. The needle was removed and hemostasis was achieved with manual compression. Post procedure scan was negative for significant hematoma. A dressing was placed. The patient tolerated the procedure well without immediate postprocedural complication. FINDINGS: *Three passes with core biopsies obtained. *Pt was restless for the procedure, and given proximity to RCCA and RIJV, additional samples could not be obtained. IMPRESSION: Successful ultrasound guided biopsy of RIGHT cervical/supraclavicular lymph node, as above. Michaelle Birks, MD [facility] Radiology Electronically Signed   By: Michaelle Birks M.D.   On: 10/01/2021 12:18   NM PET Image Restage (PS) Skull Base to Thigh (F-18 FDG)  Result Date: 09/18/2021 CLINICAL DATA:  Follow-up treatment strategy for non-small cell lung cancer. EXAM: NUCLEAR MEDICINE PET SKULL BASE TO THIGH TECHNIQUE: 0.6 mCi F-18 FDG was injected intravenously. Full-ring PET imaging was performed from the skull base to thigh after the radiotracer. CT data was  obtained and used for attenuation correction and anatomic localization. Fasting blood glucose: 80 mg/dl COMPARISON:  Chest CT dated September 07, 2021 FINDINGS: Mediastinal blood pool activity: SUV max 2.4 Liver activity: SUV max 2.9 NECK: Hypermetabolic right supraclavicular lymph nodes, largest measuring 2.8 x 2.5 cm on series 4, image 38, SUV max of 8.5, central hypodensity which is likely due to necrosis, previously measured 2.3 x 1.6 cm. Subcentimeter left supraclavicular lymph node measuring 0.6 cm short axis on series 4, image 38, SUV max of 5.6, unchanged in size when compared with prior exam. Incidental CT findings: None. CHEST: Hypermetabolic mediastinal and right hilar lymph nodes. Reference right upper paratracheal lymph node measuring 1.9 x 1.9 cm on series 4, image 43, SUV max of 9.6, not significantly changed when compared with prior exam and remeasured in similar plane. Reference right hilar lymph node measuring 1.0 cm in short axis on series 4, image 62, with SUV max of 6.1, unchanged in size when compared to prior exam. Incidental CT findings: Mild coronary artery calcifications of the LAD. Moderate centrilobular emphysema. Right upper lobe linear opacities, unchanged when compared with prior exam and likely postradiation change, mild diffuse uptake which is less than mediastinal blood pool. ABDOMEN/PELVIS: No abnormal hypermetabolic activity within the  liver, pancreas, adrenal glands, or spleen. No hypermetabolic lymph nodes in the abdomen or pelvis. Incidental CT findings: Cholecystectomy clips. Atherosclerotic disease of the abdominal aorta. Hyperdense lesion of the anterior right kidney measuring 1.0 cm on series 4, image 107, with no FDG uptake, unchanged when compared to prior and likely a proteinaceous cyst. SKELETON: No focal hypermetabolic activity to suggest skeletal metastasis. Relative decreased FDG uptake is seen in the thoracic spine, likely postradiation change. Incidental CT findings:  None. IMPRESSION: 1. Hypermetabolic mediastinal, right hilar and bilateral supraclavicular lymph nodes, compared with most recent prior chest CT dated September 07, 2021 there has been interval increased size of the largest right supraclavicular lymph node with evidence of central necrosis. 2. Right upper lobe postradiation change. 3. No evidence of metastatic disease in the abdomen or pelvis. Electronically Signed   By: Yetta Glassman M.D.   On: 09/18/2021 09:53    ASSESSMENT AND PLAN: This is a very pleasant 56 years old white female with stage IIIB (T1b, N3, M0) non-small cell lung cancer, adenocarcinoma diagnosed in November 2022 with no actionable mutation and negative PD-L1 expression. The patient completed a course of concurrent chemoradiation with weekly carboplatin for AUC of 2 and paclitaxel 45 Mg/M2 status post 7 cycles.  She has been tolerating her treatment well except for the mild odynophagia and skin burns. She was also recently admitted to the hospital complaining of dysphagia and odynophagia secondary to radiation induced esophagitis.  She is feeling much better but continues to have residual dysphagia.  She is followed by gastroenterology and was seen by Dr. Ardis Hughs during her hospitalization. Her scan showed improvement of her disease. I recommended for the patient treatment with consolidation immunotherapy with Imfinzi 1500 Mg IV every 4 weeks.  Status post 3 cycles.  Last dose was given in April 2023.  Her treatment was discontinued secondary to suspicious immunotherapy mediated pneumonitis with significant shortness of breath at that time and she was treated with a tapered dose of prednisone. She had repeat CT scan of the chest performed recently.  I personally and independently reviewed the scan images and discussed the result with the patient and her daughter. Unfortunately her scan showed evidence for disease recurrence with enlargement of lower right cervical lymph nodes as well as  mediastinal lymphadenopathy.  She has palpable right cervical lymphadenopathy. 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 There was no evidence for distant metastatic disease. The patient underwent ultrasound-guided core biopsy of the right supraclavicular lymph nodes but the final pathology showed necrotic tumor cells with complete coagulative necrosis with focal fibrous tissue and histiocytic reaction.  She also had MRI of the brain that showed no evidence of metastatic disease to the brain. The patient was seen by Dr. Sondra Come and started palliative radiotherapy to the enlarging right supraclavicular lymphadenopathy.  It is expected to be completed on October 28, 2021. I recommended for the patient to proceed with her radiotherapy as planned.  I will hold on starting any systemic chemotherapy/immunotherapy for now. I will see her back for follow-up visit in 3 months for evaluation with repeat CT scan of the neck and the chest for restaging of her disease. The patient asked about targeted therapy and I explained to  her that she does not have any actionable mutations to receive the targeted therapy. She was advised to call immediately if she has any other concerning symptoms in the interval. The patient voices understanding of current disease status and treatment options and is in agreement with the current care plan.  All questions were answered. The patient knows to call the clinic with any problems, questions or concerns. We can certainly see the patient much sooner if necessary.  The total time spent in the appointment was 30 minutes.  Disclaimer:  This note was dictated with voice recognition software. Similar sounding words can inadvertently be transcribed and may not be corrected upon review.

## 2021-10-07 NOTE — Progress Notes (Signed)
Pulmonary Individual Treatment Plan  Patient Details  Name: TILDA SAMUDIO MRN: [id number] Date of Birth: [birth year] Referring Provider:   April Manson Pulmonary Rehab Walk Test from 07/24/2021 in [place]  Referring Provider [name]       Initial Encounter Date:  Flowsheet Row Pulmonary Rehab Walk Test from 07/24/2021 in [facility]  Date 07/24/21       Visit Diagnosis: Centrilobular emphysema ([place])  Patient's Home Medications on Admission:   Current Outpatient Medications:    acetaminophen (TYLENOL) 500 MG tablet, Take 1,000 mg by mouth every 6 (six) hours as needed for moderate pain., Disp: , Rfl:    albuterol (PROVENTIL) (2.5 MG/3ML) 0.083% nebulizer solution, albuterol sulfate 2.5 mg/3 mL (0.083 %) solution for nebulization  USE 1 VIAL IN NEBULIZER EVERY 6 HOURS AS NEEDED FOR WHEEZING OR SHORTNESS OF BREATH, Disp: , Rfl:    albuterol (VENTOLIN HFA) 108 (90 Base) MCG/ACT inhaler, Inhale 1-2 puffs into the lungs every 6 (six) hours as needed for wheezing or shortness of breath., Disp: 6.7 g, Rfl: 5   aspirin EC 81 MG tablet, Take 81 mg by mouth at bedtime., Disp: , Rfl:    atorvastatin (LIPITOR) 40 MG tablet, Take 40 mg by mouth at bedtime., Disp: , Rfl:    benzonatate (TESSALON) 100 MG capsule, Take 1-2 capsules (100-200 mg total) by mouth 3 (three) times daily as needed for cough., Disp: 30 capsule, Rfl: 0   Biotin 5000 MCG TABS, Take 5,000 mcg by mouth at bedtime., Disp: , Rfl:    buPROPion (WELLBUTRIN SR) 150 MG 12 hr tablet, Take 1 tablet by mouth twice daily, Disp: 60 tablet, Rfl: 0   Ca Carbonate-Mag Hydroxide (ROLAIDS PO), Take 1 tablet by mouth daily as needed (heartburn)., Disp: , Rfl:    fluticasone (FLONASE) 50 MCG/ACT nasal spray, Place 2 sprays into both nostrils daily as needed for allergies., Disp: , Rfl:    Fluticasone-Umeclidin-Vilant (TRELEGY ELLIPTA) 200-62.5-25 MCG/ACT AEPB, INHALE 1 PUFF ONCE  DAILY, Disp: 60 each, Rfl: 2   loratadine (CLARITIN) 10 MG tablet, Take 1 tablet by mouth once daily, Disp: 30 tablet, Rfl: 11   Multiple Vitamins-Minerals (HAIR SKIN AND NAILS FORMULA PO), Take 1 tablet by mouth daily., Disp: , Rfl:    Oxycodone HCl 10 MG TABS, Take 1 tablet by mouth 3 (three) times daily as needed (pain)., Disp: , Rfl:    Tiotropium Bromide-Olodaterol (STIOLTO RESPIMAT) 2.5-2.5 MCG/ACT AERS, Inhale 2 puffs into the lungs daily., Disp: 4 g, Rfl: 3   UBRELVY 100 MG TABS, Take 100 mg by mouth daily as needed (migraine)., Disp: , Rfl:   Past Medical History: Past Medical History:  Diagnosis Date   Abdominal discomfort    Cancer ([place])    Chronic headaches    due to allergies, sinus   COPD (chronic obstructive pulmonary disease) ([place])    per 2012 chest xray   pt states she doesn not have this now (04/10/2013)   Deviated nasal septum    Eustachian tube dysfunction    GERD (gastroesophageal reflux disease)    occasional uses Tums / Rolaids   Hearing loss    right ear   High cholesterol    History of radiation therapy    right lung 01/07/2021-02/19/2021  Dr Gery Pray   Migraine    "only once in a blue moon since RX'd allergy shots" (04/10/2013)   Pancreatitis 02/08/2013   Pneumonia    Rhinitis, allergic  Tobacco Use: Social History   Tobacco Use  Smoking Status Former   Packs/day: 0.50   Years: 41.00   Total pack years: 20.50   Types: Cigarettes   Quit date: 12/23/2020   Years since quitting: 0.7  Smokeless Tobacco Never    Labs: Review Flowsheet       Latest Ref Rng & Units 09/11/2010 03/01/2013  Labs for ITP Cardiac and Pulmonary Rehab  Cholestrol 0 - 200 mg/dL - 172   Direct LDL mg/dL - 103.3   HDL-C >39.00 mg/dL - 39.10   Trlycerides 0.0 - 149.0 mg/dL - 216.0   TCO2 0 - 100 mmol/L 25  -    Capillary Blood Glucose: Lab Results  Component Value Date   GLUCAP 80 09/16/2021   GLUCAP 102 (H) 12/12/2020     Pulmonary Assessment Scores:   Pulmonary Assessment Scores     Row Name 07/24/21 0933         ADL UCSD   SOB Score total 58       CAT Score   CAT Score 28       mMRC Score   mMRC Score 1             UCSD: Self-administered rating of dyspnea associated with activities of daily living (ADLs) 6-point scale (0 = "not at all" to 5 = "maximal or unable to do because of breathlessness")  Scoring Scores range from 0 to 120.  Minimally important difference is 5 units  CAT: CAT can identify the health impairment of COPD patients and is better correlated with disease progression.  CAT has a scoring range of zero to 40. The CAT score is classified into four groups of low (less than 10), medium (10 - 20), high (21-30) and very high (31-40) based on the impact level of disease on health status. A CAT score over 10 suggests significant symptoms.  A worsening CAT score could be explained by an exacerbation, poor medication adherence, poor inhaler technique, or progression of COPD or comorbid conditions.  CAT MCID is 2 points  mMRC: mMRC (Modified Medical Research Council) Dyspnea Scale is used to assess the degree of baseline functional disability in patients of respiratory disease due to dyspnea. No minimal important difference is established. A decrease in score of 1 point or greater is considered a positive change.   Pulmonary Function Assessment:  Pulmonary Function Assessment - 07/24/21 1222       Breath   Bilateral Breath Sounds Clear    Shortness of Breath Yes;Limiting activity;Fear of Shortness of Breath             Exercise Target Goals: Exercise Program Goal: Individual exercise prescription set using results from initial 6 min walk test and THRR while considering  patient's activity barriers and safety.   Exercise Prescription Goal: Initial exercise prescription builds to 30-45 minutes a day of aerobic activity, 2-3 days per week.  Home exercise guidelines will be given to patient during program as  part of exercise prescription that the participant will acknowledge.  Activity Barriers & Risk Stratification:  Activity Barriers & Cardiac Risk Stratification - 07/24/21 1123       Activity Barriers & Cardiac Risk Stratification   Activity Barriers Deconditioning;Muscular Weakness;Shortness of Breath             6 Minute Walk:  6 Minute Walk     Row Name 07/24/21 1012         6 Minute Walk   Phase Initial  Distance 1400 feet     Walk Time 6 minutes     # of Rest Breaks 0     MPH 2.65     METS 3.89     RPE 11     Perceived Dyspnea  1     VO2 Peak 13.6     Symptoms No     Resting HR 87 bpm     Resting BP 104/50     Resting Oxygen Saturation  98 %     Exercise Oxygen Saturation  during 6 min walk 96 %     Max Ex. HR 103 bpm     Max Ex. BP 110/64     2 Minute Post BP 106/60       Interval HR   1 Minute HR 95     2 Minute HR 103     3 Minute HR 97     4 Minute HR 97     5 Minute HR 100     6 Minute HR 98     2 Minute Post HR 88     Interval Heart Rate? Yes       Interval Oxygen   Interval Oxygen? Yes     Baseline Oxygen Saturation % 98 %     1 Minute Oxygen Saturation % 98 %     1 Minute Liters of Oxygen 0 L     2 Minute Oxygen Saturation % 96 %     2 Minute Liters of Oxygen 0 L     3 Minute Oxygen Saturation % 98 %     3 Minute Liters of Oxygen 0 L     4 Minute Oxygen Saturation % 93 %     4 Minute Liters of Oxygen 0 L     5 Minute Oxygen Saturation % 98 %     5 Minute Liters of Oxygen 0 L     6 Minute Oxygen Saturation % 98 %     6 Minute Liters of Oxygen 0 L     2 Minute Post Oxygen Saturation % 98 %     2 Minute Post Liters of Oxygen 0 L              Oxygen Initial Assessment:  Oxygen Initial Assessment - 07/24/21 0922       Home Oxygen   Home Oxygen Device None    Sleep Oxygen Prescription None    Home Exercise Oxygen Prescription None    Home Resting Oxygen Prescription None      Initial 6 min Walk   Oxygen Used None       Program Oxygen Prescription   Program Oxygen Prescription None      Intervention   Short Term Goals To learn and exhibit compliance with exercise, home and travel O2 prescription;To learn and understand importance of monitoring SPO2 with pulse oximeter and demonstrate accurate use of the pulse oximeter.;To learn and understand importance of maintaining oxygen saturations>88%;To learn and demonstrate proper pursed lip breathing techniques or other breathing techniques. ;To learn and demonstrate proper use of respiratory medications    Long  Term Goals Exhibits compliance with exercise, home  and travel O2 prescription;Verbalizes importance of monitoring SPO2 with pulse oximeter and return demonstration;Maintenance of O2 saturations>88%;Exhibits proper breathing techniques, such as pursed lip breathing or other method taught during program session;Compliance with respiratory medication;Demonstrates proper use of MDI's             Oxygen Re-Evaluation:  Oxygen Re-Evaluation     Row Name 08/10/21 0941 09/02/21 1127 10/01/21 0825         Program Oxygen Prescription   Program Oxygen Prescription None None None       Home Oxygen   Home Oxygen Device None None None     Sleep Oxygen Prescription None None None     Home Exercise Oxygen Prescription None None None     Home Resting Oxygen Prescription None None None       Goals/Expected Outcomes   Short Term Goals To learn and exhibit compliance with exercise, home and travel O2 prescription;To learn and understand importance of monitoring SPO2 with pulse oximeter and demonstrate accurate use of the pulse oximeter.;To learn and understand importance of maintaining oxygen saturations>88%;To learn and demonstrate proper pursed lip breathing techniques or other breathing techniques. ;To learn and demonstrate proper use of respiratory medications To learn and exhibit compliance with exercise, home and travel O2 prescription;To learn and understand  importance of monitoring SPO2 with pulse oximeter and demonstrate accurate use of the pulse oximeter.;To learn and understand importance of maintaining oxygen saturations>88%;To learn and demonstrate proper pursed lip breathing techniques or other breathing techniques. ;To learn and demonstrate proper use of respiratory medications To learn and exhibit compliance with exercise, home and travel O2 prescription;To learn and understand importance of monitoring SPO2 with pulse oximeter and demonstrate accurate use of the pulse oximeter.;To learn and understand importance of maintaining oxygen saturations>88%;To learn and demonstrate proper pursed lip breathing techniques or other breathing techniques. ;To learn and demonstrate proper use of respiratory medications     Long  Term Goals Exhibits compliance with exercise, home  and travel O2 prescription;Verbalizes importance of monitoring SPO2 with pulse oximeter and return demonstration;Maintenance of O2 saturations>88%;Exhibits proper breathing techniques, such as pursed lip breathing or other method taught during program session;Compliance with respiratory medication;Demonstrates proper use of MDI's Exhibits compliance with exercise, home  and travel O2 prescription;Verbalizes importance of monitoring SPO2 with pulse oximeter and return demonstration;Maintenance of O2 saturations>88%;Exhibits proper breathing techniques, such as pursed lip breathing or other method taught during program session;Compliance with respiratory medication;Demonstrates proper use of MDI's Exhibits compliance with exercise, home  and travel O2 prescription;Verbalizes importance of monitoring SPO2 with pulse oximeter and return demonstration;Maintenance of O2 saturations>88%;Exhibits proper breathing techniques, such as pursed lip breathing or other method taught during program session;Compliance with respiratory medication;Demonstrates proper use of MDI's     Goals/Expected Outcomes  Compliance and understanding of oxygen saturation monitoring and breathing techniques to decrease shortness of breath. Compliance and understanding of oxygen saturation monitoring and breathing techniques to decrease shortness of breath. Compliance and understanding of oxygen saturation monitoring and breathing techniques to decrease shortness of breath.              Oxygen Discharge (Final Oxygen Re-Evaluation):  Oxygen Re-Evaluation - 10/01/21 0825       Program Oxygen Prescription   Program Oxygen Prescription None      Home Oxygen   Home Oxygen Device None    Sleep Oxygen Prescription None    Home Exercise Oxygen Prescription None    Home Resting Oxygen Prescription None      Goals/Expected Outcomes   Short Term Goals To learn and exhibit compliance with exercise, home and travel O2 prescription;To learn and understand importance of monitoring SPO2 with pulse oximeter and demonstrate accurate use of the pulse oximeter.;To learn and understand importance of maintaining oxygen saturations>88%;To learn and demonstrate proper pursed lip breathing techniques  or other breathing techniques. ;To learn and demonstrate proper use of respiratory medications    Long  Term Goals Exhibits compliance with exercise, home  and travel O2 prescription;Verbalizes importance of monitoring SPO2 with pulse oximeter and return demonstration;Maintenance of O2 saturations>88%;Exhibits proper breathing techniques, such as pursed lip breathing or other method taught during program session;Compliance with respiratory medication;Demonstrates proper use of MDI's    Goals/Expected Outcomes Compliance and understanding of oxygen saturation monitoring and breathing techniques to decrease shortness of breath.             Initial Exercise Prescription:  Initial Exercise Prescription - 07/24/21 1000       Date of Initial Exercise RX and Referring Provider   Date 07/24/21    Referring Provider [name]    Expected  Discharge Date 10/01/21      NuStep   Level 1    SPM 70    Minutes 15      Track   Minutes 15    METs 3.89      Prescription Details   Frequency (times per week) 2    Duration Progress to 30 minutes of continuous aerobic without signs/symptoms of physical distress      Intensity   THRR 40-80% of Max Heartrate 65-131    Ratings of Perceived Exertion 11-13    Perceived Dyspnea 0-4      Progression   Progression Continue to progress workloads to maintain intensity without signs/symptoms of physical distress.      Resistance Training   Training Prescription Yes    Weight red bands    Reps 10-15             Perform Capillary Blood Glucose checks as needed.  Exercise Prescription Changes:   Exercise Prescription Changes     Row Name 08/11/21 1200 08/20/21 1204 09/08/21 1200 09/22/21 1200 10/06/21 1200     Response to Exercise   Blood Pressure (Admit) 122/60 100/60 100/58 108/60 104/60   Blood Pressure (Exercise) 128/66 -- 118/62 100/60 114/70   Blood Pressure (Exit) 102/64 92/62 1'08/70 96/50 90/64 '   Heart Rate (Admit) 95 bpm 75 bpm 94 bpm 107 bpm 110 bpm   Heart Rate (Exercise) 130 bpm 114 bpm 106 bpm 116 bpm 123 bpm   Heart Rate (Exit) 92 bpm 92 bpm 86 bpm 111 bpm 106 bpm   Oxygen Saturation (Admit) 100 % 96 % 98 % 98 % 96 %   Oxygen Saturation (Exercise) 95 % 96 % 95 % 97 % 98 %   Oxygen Saturation (Exit) 98 % 97 % 98 % 97 % 96 %   Rating of Perceived Exertion (Exercise) '13 13 11 13 13   ' Perceived Dyspnea (Exercise) '1 1 1 1 1   ' Duration -- -- Continue with 30 min of aerobic exercise without signs/symptoms of physical distress. Continue with 30 min of aerobic exercise without signs/symptoms of physical distress. Continue with 30 min of aerobic exercise without signs/symptoms of physical distress.   Intensity --  40-80 % HRR THRR unchanged THRR unchanged THRR unchanged THRR unchanged     Progression   Progression -- -- Continue to progress workloads to maintain  intensity without signs/symptoms of physical distress. Continue to progress workloads to maintain intensity without signs/symptoms of physical distress. Continue to progress workloads to maintain intensity without signs/symptoms of physical distress.     Resistance Training   Training Prescription Yes Yes Yes Yes Yes   Weight Blue bands blue bands blue bands Blue bands Blue  bands   Reps 10-15 10-15 10-15 10-15 10-15   Time 10 Minutes 10 Minutes 10 Minutes 10 Minutes 10 Minutes     Treadmill   MPH 2.1 2.1 2.1 2.5 2.5   Grade 0 0 0 0 1   Minutes '15 15 15 15 15   ' METs 2.61 2.61 2.61 -- 3.26     NuStep   Level '2 3 4 4 4   ' SPM 70 70 80 80 80   Minutes '15 15 15 15 15   ' METs 2.8 3 3.4 4.2 4     Home Exercise Plan   Plans to continue exercise at -- -- Home (comment)  walking at home -- --   Frequency -- -- --  n/a -- --   Initial Home Exercises Provided -- -- 09/08/21 -- --            Exercise Comments:   Exercise Comments     Row Name 07/30/21 1500 09/08/21 1201         Exercise Comments Pt completed first day of exercise. She exercised 15 min on the track and Nustep. Kim averaged 2.51 METs on the track and 2.0 METs at level 2 on the Nustep. Kim performed the warmup and cooldown standing without limitations. Completed home exercise plan with pt. She is already walking 30-60 min 3 nonrehab days a week. Encouraged her to continue. I am confident she will. she is thinking of joining an exercise facility after pulmonary rehab.               Exercise Goals and Review:   Exercise Goals     Row Name 07/24/21 0925 08/10/21 0917 09/02/21 1122 10/01/21 0821       Exercise Goals   Increase Physical Activity Yes Yes Yes Yes    Intervention Provide advice, education, support and counseling about physical activity/exercise needs.;Develop an individualized exercise prescription for aerobic and resistive training based on initial evaluation findings, risk stratification,  comorbidities and participant's personal goals. Provide advice, education, support and counseling about physical activity/exercise needs.;Develop an individualized exercise prescription for aerobic and resistive training based on initial evaluation findings, risk stratification, comorbidities and participant's personal goals. Provide advice, education, support and counseling about physical activity/exercise needs.;Develop an individualized exercise prescription for aerobic and resistive training based on initial evaluation findings, risk stratification, comorbidities and participant's personal goals. Provide advice, education, support and counseling about physical activity/exercise needs.;Develop an individualized exercise prescription for aerobic and resistive training based on initial evaluation findings, risk stratification, comorbidities and participant's personal goals.    Expected Outcomes Short Term: Attend rehab on a regular basis to increase amount of physical activity.;Long Term: Add in home exercise to make exercise part of routine and to increase amount of physical activity.;Long Term: Exercising regularly at least 3-5 days a week. Short Term: Attend rehab on a regular basis to increase amount of physical activity.;Long Term: Add in home exercise to make exercise part of routine and to increase amount of physical activity.;Long Term: Exercising regularly at least 3-5 days a week. Short Term: Attend rehab on a regular basis to increase amount of physical activity.;Long Term: Add in home exercise to make exercise part of routine and to increase amount of physical activity.;Long Term: Exercising regularly at least 3-5 days a week. Short Term: Attend rehab on a regular basis to increase amount of physical activity.;Long Term: Add in home exercise to make exercise part of routine and to increase amount of physical activity.;Long Term: Exercising regularly  at least 3-5 days a week.    Increase Strength and  Stamina Yes Yes Yes Yes    Intervention Provide advice, education, support and counseling about physical activity/exercise needs.;Develop an individualized exercise prescription for aerobic and resistive training based on initial evaluation findings, risk stratification, comorbidities and participant's personal goals. Provide advice, education, support and counseling about physical activity/exercise needs.;Develop an individualized exercise prescription for aerobic and resistive training based on initial evaluation findings, risk stratification, comorbidities and participant's personal goals. Provide advice, education, support and counseling about physical activity/exercise needs.;Develop an individualized exercise prescription for aerobic and resistive training based on initial evaluation findings, risk stratification, comorbidities and participant's personal goals. Provide advice, education, support and counseling about physical activity/exercise needs.;Develop an individualized exercise prescription for aerobic and resistive training based on initial evaluation findings, risk stratification, comorbidities and participant's personal goals.    Expected Outcomes Short Term: Increase workloads from initial exercise prescription for resistance, speed, and METs.;Short Term: Perform resistance training exercises routinely during rehab and add in resistance training at home;Long Term: Improve cardiorespiratory fitness, muscular endurance and strength as measured by increased METs and functional capacity (6MWT) Short Term: Increase workloads from initial exercise prescription for resistance, speed, and METs.;Short Term: Perform resistance training exercises routinely during rehab and add in resistance training at home;Long Term: Improve cardiorespiratory fitness, muscular endurance and strength as measured by increased METs and functional capacity (6MWT) Short Term: Increase workloads from initial exercise prescription  for resistance, speed, and METs.;Short Term: Perform resistance training exercises routinely during rehab and add in resistance training at home;Long Term: Improve cardiorespiratory fitness, muscular endurance and strength as measured by increased METs and functional capacity (6MWT) Short Term: Increase workloads from initial exercise prescription for resistance, speed, and METs.;Short Term: Perform resistance training exercises routinely during rehab and add in resistance training at home;Long Term: Improve cardiorespiratory fitness, muscular endurance and strength as measured by increased METs and functional capacity (6MWT)    Able to understand and use rate of perceived exertion (RPE) scale Yes Yes Yes Yes    Intervention Provide education and explanation on how to use RPE scale Provide education and explanation on how to use RPE scale Provide education and explanation on how to use RPE scale Provide education and explanation on how to use RPE scale    Expected Outcomes Short Term: Able to use RPE daily in rehab to express subjective intensity level;Long Term:  Able to use RPE to guide intensity level when exercising independently Short Term: Able to use RPE daily in rehab to express subjective intensity level;Long Term:  Able to use RPE to guide intensity level when exercising independently Short Term: Able to use RPE daily in rehab to express subjective intensity level;Long Term:  Able to use RPE to guide intensity level when exercising independently Short Term: Able to use RPE daily in rehab to express subjective intensity level;Long Term:  Able to use RPE to guide intensity level when exercising independently    Able to understand and use Dyspnea scale Yes Yes Yes Yes    Intervention Provide education and explanation on how to use Dyspnea scale Provide education and explanation on how to use Dyspnea scale Provide education and explanation on how to use Dyspnea scale Provide education and explanation on  how to use Dyspnea scale    Expected Outcomes Short Term: Able to use Dyspnea scale daily in rehab to express subjective sense of shortness of breath during exertion;Long Term: Able to use Dyspnea scale to guide intensity  level when exercising independently Short Term: Able to use Dyspnea scale daily in rehab to express subjective sense of shortness of breath during exertion;Long Term: Able to use Dyspnea scale to guide intensity level when exercising independently Short Term: Able to use Dyspnea scale daily in rehab to express subjective sense of shortness of breath during exertion;Long Term: Able to use Dyspnea scale to guide intensity level when exercising independently Short Term: Able to use Dyspnea scale daily in rehab to express subjective sense of shortness of breath during exertion;Long Term: Able to use Dyspnea scale to guide intensity level when exercising independently    Knowledge and understanding of Target Heart Rate Range (THRR) Yes Yes Yes Yes    Intervention Provide education and explanation of THRR including how the numbers were predicted and where they are located for reference Provide education and explanation of THRR including how the numbers were predicted and where they are located for reference Provide education and explanation of THRR including how the numbers were predicted and where they are located for reference Provide education and explanation of THRR including how the numbers were predicted and where they are located for reference    Expected Outcomes Short Term: Able to state/look up THRR;Long Term: Able to use THRR to govern intensity when exercising independently;Short Term: Able to use daily as guideline for intensity in rehab Short Term: Able to state/look up THRR;Long Term: Able to use THRR to govern intensity when exercising independently;Short Term: Able to use daily as guideline for intensity in rehab Short Term: Able to state/look up THRR;Long Term: Able to use THRR to  govern intensity when exercising independently;Short Term: Able to use daily as guideline for intensity in rehab Short Term: Able to state/look up THRR;Long Term: Able to use THRR to govern intensity when exercising independently;Short Term: Able to use daily as guideline for intensity in rehab    Understanding of Exercise Prescription Yes Yes Yes Yes    Intervention Provide education, explanation, and written materials on patient's individual exercise prescription Provide education, explanation, and written materials on patient's individual exercise prescription Provide education, explanation, and written materials on patient's individual exercise prescription Provide education, explanation, and written materials on patient's individual exercise prescription    Expected Outcomes Short Term: Able to explain program exercise prescription;Long Term: Able to explain home exercise prescription to exercise independently Short Term: Able to explain program exercise prescription;Long Term: Able to explain home exercise prescription to exercise independently Short Term: Able to explain program exercise prescription;Long Term: Able to explain home exercise prescription to exercise independently Short Term: Able to explain program exercise prescription;Long Term: Able to explain home exercise prescription to exercise independently             Exercise Goals Re-Evaluation :  Exercise Goals Re-Evaluation     Row Name 08/10/21 0917 09/02/21 1122 10/01/21 0821         Exercise Goal Re-Evaluation   Exercise Goals Review Increase Physical Activity;Increase Strength and Stamina;Able to understand and use rate of perceived exertion (RPE) scale;Able to understand and use Dyspnea scale;Knowledge and understanding of Target Heart Rate Range (THRR);Understanding of Exercise Prescription Increase Physical Activity;Increase Strength and Stamina;Able to understand and use rate of perceived exertion (RPE) scale;Able to  understand and use Dyspnea scale;Knowledge and understanding of Target Heart Rate Range (THRR);Understanding of Exercise Prescription Increase Physical Activity;Increase Strength and Stamina;Able to understand and use rate of perceived exertion (RPE) scale;Able to understand and use Dyspnea scale;Knowledge and understanding of Target Heart Rate  Range (THRR);Understanding of Exercise Prescription     Comments [name] has completed 2 exercise sessions. She exercises for 15 min on the treadmill and Nustep. Kim averages 2.45 METs at 1.9 mph and 0% incline on the treadmill and 2.2 METs at level 2 on the Nustep. [name] has been progressed from the track to the treadmill as she tolerates this fair. She has not walked on a treadmill, so there is a learning curve. We will still work on proper gait when walking on the treadmill. Kim performs the warmup and cooldown standing without limitations. She is motivated to exercise and improve her functional capacity. Will continue to monitor and progress as able. [name] has completed 6 exercise sessions. She exercises for 15 min on the treadmill and Nustep. Kim averages 2.61 METs, 2.1 mph @ 0, on the treadmill and 3.0 METs at level 3 on hte Nustep. Kim performs the warmup and cooldown standing without any limitations. She has missed a few sessions due to a vacation. I have been unable to progress her because of her vacation. Will continue to monitor and progress as able. [name] has completed 14 exercise sessions. She exercises for 15 min on the treadmill and Nustep. Kim averages 2.91 METs at 2.5 mph and 0 incline on the treadmill and 3.5 METs at level 4 on the Nustep. She has increased her workload for the Nustep but not the treadmill. Will consider increasing her incline on the treadmill. [name] is very motivated to exercise. We have discussed home exercise as [name] is meeting physical activity guidelines. Will continue to monitor and progress as able.     Expected Outcomes Through exercise at  rehab and home, the patient will decrease shortness of breath with daily activities and feel confident in carrying out an exercise regimen at home. Through exercise at rehab and home, the patient will decrease shortness of breath with daily activities and feel confident in carrying out an exercise regimen at home. Through exercise at rehab and home, the patient will decrease shortness of breath with daily activities and feel confident in carrying out an exercise regimen at home.              Discharge Exercise Prescription (Final Exercise Prescription Changes):  Exercise Prescription Changes - 10/06/21 1200       Response to Exercise   Blood Pressure (Admit) 104/60    Blood Pressure (Exercise) 114/70    Blood Pressure (Exit) 90/64    Heart Rate (Admit) 110 bpm    Heart Rate (Exercise) 123 bpm    Heart Rate (Exit) 106 bpm    Oxygen Saturation (Admit) 96 %    Oxygen Saturation (Exercise) 98 %    Oxygen Saturation (Exit) 96 %    Rating of Perceived Exertion (Exercise) 13    Perceived Dyspnea (Exercise) 1    Duration Continue with 30 min of aerobic exercise without signs/symptoms of physical distress.    Intensity THRR unchanged      Progression   Progression Continue to progress workloads to maintain intensity without signs/symptoms of physical distress.      Resistance Training   Training Prescription Yes    Weight Blue bands    Reps 10-15    Time 10 Minutes      Treadmill   MPH 2.5    Grade 1    Minutes 15    METs 3.26      NuStep   Level 4    SPM 80    Minutes 15  METs 4             Nutrition:  Target Goals: Understanding of nutrition guidelines, daily intake of sodium <1527m, cholesterol <2095m calories 30% from fat and 7% or less from saturated fats, daily to have 5 or more servings of fruits and vegetables.  Biometrics:   Post Biometrics - 07/24/21 1016        Post  Biometrics   Grip Strength 17 kg             Nutrition Therapy Plan and  Nutrition Goals:  Nutrition Therapy & Goals - 09/29/21 1145       Nutrition Therapy   Diet General Healthy Diet    Drug/Food Interactions Statins/Certain Fruits      Personal Nutrition Goals   Nutrition Goal Patient to choose a daily variety of fruits, vegetables, whole grains, lean protein/plant protein, and dairy products as part of heart healthy diet.    Personal Goal #2 Patient to understand strategies for weight gain of 0.5-2.0# per week.    Comments Goals in progress. Patient continues 1-2 Ensure daily ( up to 440 calories, 18g protein) to aid with weight gain/weight maintenance. She reports good appetite. Patient continues to maintain weight. Discussed strategies for weight maintenance including high calorie nutrition supplements or increasing supplement frequency, etc to aid with weight maintenance during upcoming planned radiation. Patient amicable to RD suggestions.      Intervention Plan   Intervention Prescribe, educate and counsel regarding individualized specific dietary modifications aiming towards targeted core components such as weight, hypertension, lipid management, diabetes, heart failure and other comorbidities.    Expected Outcomes Short Term Goal: Understand basic principles of dietary content, such as calories, fat, sodium, cholesterol and nutrients.;Long Term Goal: Adherence to prescribed nutrition plan.             Nutrition Assessments:  MEDIFICTS Score Key: ?70 Need to make dietary changes  40-70 Heart Healthy Diet ? 40 Therapeutic Level Cholesterol Diet   Picture Your Plate Scores: <4<25nhealthy dietary pattern with much room for improvement. 41-50 Dietary pattern unlikely to meet recommendations for good health and room for improvement. 51-60 More healthful dietary pattern, with some room for improvement.  >60 Healthy dietary pattern, although there may be some specific behaviors that could be improved.    Nutrition Goals Re-Evaluation:  Nutrition  Goals Re-Evaluation     RoJenningsame 07/30/21 1104 08/11/21 1109 09/08/21 1238 09/29/21 1145       Goals   Current Weight 114 lb 10.2 oz (52 kg) 116 lb 2.9 oz (52.7 kg) 113 lb 12.1 oz (51.6 kg) 117 lb 8.1 oz (53.3 kg)    Comment Patient reports her typical weight is 127-128#; she got down as low as 98# when diagnosed with cancer. She reports weight is appropriately trending upward. -- weight stable. --    Expected Outcome -- Goals in progress. Patient continues 2 Ensure daily (440 calories, 18g protein) to aid with weight gain/weight maintenance. Weight stable; goal weight 128#. Goals in progress. Patient continues 2 Ensure daily (440 calories, 18g protein) to aid with weight gain/weight maintenance. Follow-up tomorrow with oncology; she reports increased difficulty swallowing, however, PO intake remains normal per patient. Goals in progress. Patient continues 1-2 Ensure daily ( up to 440 calories, 18g protein) to aid with weight gain/weight maintenance. She reports good appetite. Patient continues to maintain weight. Discussed strategies for weight maintenance including high calorie nutrition supplements or increasing supplement frequency, etc to aid with weight maintenance  during upcoming planned radiation. Patient amicable to RD suggestions.             Nutrition Goals Discharge (Final Nutrition Goals Re-Evaluation):  Nutrition Goals Re-Evaluation - 09/29/21 1145       Goals   Current Weight 117 lb 8.1 oz (53.3 kg)    Expected Outcome Goals in progress. Patient continues 1-2 Ensure daily ( up to 440 calories, 18g protein) to aid with weight gain/weight maintenance. She reports good appetite. Patient continues to maintain weight. Discussed strategies for weight maintenance including high calorie nutrition supplements or increasing supplement frequency, etc to aid with weight maintenance during upcoming planned radiation. Patient amicable to RD suggestions.              Psychosocial: Target Goals: Acknowledge presence or absence of significant depression and/or stress, maximize coping skills, provide positive support system. Participant is able to verbalize types and ability to use techniques and skills needed for reducing stress and depression.  Initial Review & Psychosocial Screening:  Initial Psych Review & Screening - 07/24/21 0912       Initial Review   Current issues with None Identified      Family Dynamics   Good Support System? Yes    Comments daughter, mother, son      Barriers   Psychosocial barriers to participate in program There are no identifiable barriers or psychosocial needs.      Screening Interventions   Interventions Encouraged to exercise             Quality of Life Scores:  Scores of 19 and below usually indicate a poorer quality of life in these areas.  A difference of  2-3 points is a clinically meaningful difference.  A difference of 2-3 points in the total score of the Quality of Life Index has been associated with significant improvement in overall quality of life, self-image, physical symptoms, and general health in studies assessing change in quality of life.  PHQ-9: Review Flowsheet       07/24/2021  Depression screen PHQ 2/9  Decreased Interest 0  Down, Depressed, Hopeless 0  PHQ - 2 Score 0  Altered sleeping 0  Tired, decreased energy 0  Change in appetite 0  Feeling bad or failure about yourself  0  Trouble concentrating 0  Moving slowly or fidgety/restless 0  Suicidal thoughts 0  PHQ-9 Score 0   Interpretation of Total Score  Total Score Depression Severity:  1-4 = Minimal depression, 5-9 = Mild depression, 10-14 = Moderate depression, 15-19 = Moderately severe depression, 20-27 = Severe depression   Psychosocial Evaluation and Intervention:  Psychosocial Evaluation - 07/24/21 0915       Psychosocial Evaluation & Interventions   Interventions Encouraged to exercise with the program and  follow exercise prescription    Expected Outcomes For Kim to participate in [place] rehab    Continue Psychosocial Services  Follow up required by staff             Psychosocial Re-Evaluation:  Psychosocial Re-Evaluation     [place] Name 08/12/21 1030 09/02/21 1534 09/30/21 1801         Psychosocial Re-Evaluation   Current issues with None Identified None Identified Current Stress Concerns     Comments [name] has been able to participate in the PR program without any psychosocial barriers.She has attended 3 exercise sessions. [name] has been attending the PR program without any psychosocial barriers. [name] has been able to attend the PR program without any psychosocial  barriers. Now very recently she developed a swollen place in her neck and had a scan. 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 She had a PET scan and saw her Oncologist. She is scheduled to start radiation.We have provided emotional support. She has very positive outlook and plans to fight the cancer as she did previously.     Expected Outcomes For her to continue to attend PR without any psychosocial issues or concerns. For Kim to continue to particiapate in the program without any psychosocial issues ot concerns. For her to continue to participate in the program with out any psychosocial coners or issues.     Interventions Encouraged to attend Pulmonary Rehabilitation for the exercise Encouraged to attend Pulmonary Rehabilitation for the exercise Encouraged to attend Pulmonary Rehabilitation for the exercise     Continue Psychosocial Services  Follow up required by staff Follow up required by staff No Follow up required              Psychosocial Discharge (Final Psychosocial Re-Evaluation):  Psychosocial Re-Evaluation - 09/30/21 1801       Psychosocial Re-Evaluation   Current issues with Current Stress Concerns    Comments [name] has been able to attend the PR program without any psychosocial barriers. Now very recently she  developed a swollen place in her neck and had a scan. 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 She had a PET scan and saw her Oncologist. She is scheduled to start radiation.We have provided emotional support. She has very positive outlook and plans to fight the cancer as she did previously.    Expected Outcomes For her to continue to participate in the program with out any psychosocial coners or issues.    Interventions Encouraged to attend Pulmonary Rehabilitation for the exercise    Continue Psychosocial Services  No Follow up required             Education: Education Goals: Education classes will be provided on a weekly basis, covering required topics. Participant will state understanding/return demonstration of topics presented.  Learning Barriers/Preferences:  Learning Barriers/Preferences - 07/24/21 9390       Learning Barriers/Preferences   Learning Barriers Sight   wears glasses   Learning Preferences Video;Verbal Instruction;Skilled Demonstration;Pictoral;Individual Instruction;Group Instruction;Computer/Internet;Audio             Education Topics: Introduction to Pulmonary Rehab Group instruction provided by PowerPoint, verbal discussion, and written material to support subject matter. Instructor reviews what Pulmonary Rehab is, the purpose of the program, and how patients are referred.     Know Your Numbers Group instruction that is supported by a PowerPoint presentation. Instructor discusses importance of knowing and understanding resting, exercise, and post-exercise oxygen saturation, heart rate, and blood pressure. Oxygen saturation, heart rate, blood pressure, rating of perceived exertion, and dyspnea are reviewed along with a normal range for these values.    Exercise for the Pulmonary Patient Group instruction that is supported by a PowerPoint presentation. Instructor discusses benefits of exercise, core components of exercise, frequency, duration, and intensity of  an exercise routine, importance of utilizing pulse oximetry during exercise, safety while exercising, and options of places to exercise outside of rehab.  Flowsheet Row PULMONARY REHAB OTHER RESPIRATORY from 09/24/2021 in [place]  Date 09/03/21  Educator Donnetta Simpers  Instruction Review Code 2- Demonstrated Understanding          MET Level  Group instruction provided by PowerPoint, verbal discussion, and written material to support subject matter. Instructor reviews what  METs are and how to increase METs.    Pulmonary Medications Verbally interactive group education provided by instructor with focus on inhaled medications and proper administration. Flowsheet Row PULMONARY REHAB OTHER RESPIRATORY from 09/24/2021 in [place]  Date 09/24/21  Educator Donnetta Simpers  Instruction Review Code 1- Verbalizes Understanding       Anatomy and Physiology of the Respiratory System Group instruction provided by PowerPoint, verbal discussion, and written material to support subject matter. Instructor reviews respiratory cycle and anatomical components of the respiratory system and their functions. Instructor also reviews differences in obstructive and restrictive respiratory diseases with examples of each.    Oxygen Safety Group instruction provided by PowerPoint, verbal discussion, and written material to support subject matter. There is an overview of "What is Oxygen" and "Why do we need it".  Instructor also reviews how to create a safe environment for oxygen use, the importance of using oxygen as prescribed, and the risks of noncompliance. There is a brief discussion on traveling with oxygen and resources the patient may utilize.   Oxygen Use Group instruction provided by PowerPoint, verbal discussion, and written material to discuss how supplemental oxygen is prescribed and different types of oxygen supply systems. Resources for more  information are provided.    Breathing Techniques Group instruction that is supported by demonstration and informational handouts. Instructor discusses the benefits of pursed lip and diaphragmatic breathing and detailed demonstration on how to perform both.     Risk Factor Reduction Group instruction that is supported by a PowerPoint presentation. Instructor discusses the definition of a risk factor, different risk factors for pulmonary disease, and how the heart and lungs work together.   MD Day A group question and answer session with a medical doctor that allows participants to ask questions that relate to their pulmonary disease state.   Nutrition for the Pulmonary Patient Group instruction provided by PowerPoint slides, verbal discussion, and written materials to support subject matter. The instructor gives an explanation and review of healthy diet recommendations, which includes a discussion on weight management, recommendations for fruit and vegetable consumption, as well as protein, fluid, caffeine, fiber, sodium, sugar, and alcohol. Tips for eating when patients are short of breath are discussed. Flowsheet Row PULMONARY REHAB OTHER RESPIRATORY from 09/24/2021 in [place]  Date 09/17/21  Educator --  [Handout]  Instruction Review Code 1- Verbalizes Understanding        Other Education Group or individual verbal, written, or video instructions that support the educational goals of the pulmonary rehab program. Flowsheet Row PULMONARY REHAB OTHER RESPIRATORY from 09/24/2021 in [place]  Date 09/10/21  Educator Thompson Grayer handout]  Instruction Review Code 1- Verbalizes Understanding        Knowledge Questionnaire Score:  Knowledge Questionnaire Score - 07/24/21 0934       Knowledge Questionnaire Score   Pre Score 14/18             Core Components/Risk Factors/Patient Goals at Admission:  Personal  Goals and Risk Factors at Admission - 07/24/21 0917       Core Components/Risk Factors/Patient Goals on Admission   Improve shortness of breath with ADL's Yes    Intervention Provide education, individualized exercise plan and daily activity instruction to help decrease symptoms of SOB with activities of daily living.    Expected Outcomes Short Term: Improve cardiorespiratory fitness to achieve a reduction of symptoms when performing ADLs;Long Term: Be able  to perform more ADLs without symptoms or delay the onset of symptoms             Core Components/Risk Factors/Patient Goals Review:   Goals and Risk Factor Review     Row Name 08/12/21 1036 09/02/21 1537           Core Components/Risk Factors/Patient Goals Review   Personal Goals Review Improve shortness of breath with ADL's;Develop more efficient breathing techniques such as purse lipped breathing and diaphragmatic breathing and practicing self-pacing with activity.;Increase knowledge of respiratory medications and ability to use respiratory devices properly. Improve shortness of breath with ADL's;Develop more efficient breathing techniques such as purse lipped breathing and diaphragmatic breathing and practicing self-pacing with activity.;Increase knowledge of respiratory medications and ability to use respiratory devices properly.      Review [name] is progressing well in the PR program.She is exercisng on the treadmill and nu-step. She started out on the track but has progressed to the treadmill.[name] seems to be enjoying the PR program. She is increasing her workloads and METS.She is motivated to exercise and push herself to improve.She has been exercising on room air with saturations of 95-99%.We had breathing techniques education since she has been attending. [name] has continued to progress well on the Nustep and treadmill. She has improved her workloads and METS. She is motivated to push herself to improve. She states that she is enjoying  the program and is making connections with her classmates. Her room air saturations with exercisng  have been 94 to 97% and she reports mild SOB.      Expected Outcomes See admission goals.For her to continue to improve her METS and workloads. See admission goals               Core Components/Risk Factors/Patient Goals at Discharge (Final Review):   Goals and Risk Factor Review - 09/02/21 1537       Core Components/Risk Factors/Patient Goals Review   Personal Goals Review Improve shortness of breath with ADL's;Develop more efficient breathing techniques such as purse lipped breathing and diaphragmatic breathing and practicing self-pacing with activity.;Increase knowledge of respiratory medications and ability to use respiratory devices properly.    Review [name] has continued to progress well on the Nustep and treadmill. She has improved her workloads and METS. She is motivated to push herself to improve. She states that she is enjoying the program and is making connections with her classmates. Her room air saturations with exercisng  have been 94 to 97% and she reports mild SOB.    Expected Outcomes See admission goals             ITP Comments: ITP REVIEW Pt is making expected progress toward pulmonary rehab goals after completing 15 sessions. Recommend continued exercise, life style modification, education, and utilization of breathing techniques to increase stamina and strength and decrease shortness of breath with exertion.    Comments: Dr. Rodman Pickle is Medical Director for Pulmonary Rehab at [facility].

## 2021-10-08 ENCOUNTER — Other Ambulatory Visit: Payer: Self-pay

## 2021-10-08 ENCOUNTER — Encounter (HOSPITAL_COMMUNITY)
Admission: RE | Admit: 2021-10-08 | Discharge: 2021-10-08 | Disposition: A | Discharge status: Home / Self Care | Payer: Commercial Managed Care - PPO | Source: Ambulatory Visit | Attending: Emergency Medicine | Admitting: Emergency Medicine

## 2021-10-08 ENCOUNTER — Ambulatory Visit
Admission: RE | Admit: 2021-10-08 | Discharge: 2021-10-08 | Disposition: A | Discharge status: Home / Self Care | Payer: Commercial Managed Care - PPO | Source: Ambulatory Visit | Attending: Radiation Oncology | Admitting: Radiation Oncology

## 2021-10-08 DIAGNOSIS — J432 Centrilobular emphysema: Secondary | ICD-10-CM

## 2021-10-08 DIAGNOSIS — Z510 Encounter for antineoplastic radiation therapy: Secondary | ICD-10-CM | POA: Diagnosis not present

## 2021-10-08 LAB — RAD ONC ARIA SESSION SUMMARY
Course Elapsed Days: 1
Plan Fractions Treated to Date: 2
Plan Prescribed Dose Per Fraction: 2 Gy
Plan Total Fractions Prescribed: 15
Plan Total Prescribed Dose: 30 Gy
Reference Point Dosage Given to Date: 4 Gy
Reference Point Session Dosage Given: 2 Gy
Session Number: 2

## 2021-10-08 NOTE — Progress Notes (Signed)
Daily Session Note  Patient Details  Name: Julie Nicholson MRN: [id number] Date of Birth: 04/13/1965 Referring Provider:   April Manson Pulmonary Rehab Walk Test from 07/24/2021 in [place]  Referring Provider [name]       Encounter Date: 10/08/2021  Check In:  Session Check In - 10/08/21 1127       Check-In   Supervising physician immediately available to respond to emergencies [facility] immediately available    Physician(s) Fr. Cyndia Skeeters    Location [facility]    Staff Present Rosebud Poles, RN, BSN;Randi Olen Cordial BS, ACSM-CEP, Exercise Physiologist;Kaylee Rosana Hoes, MS, ACSM-CEP, Exercise Physiologist;Lisa Ysidro Evert, RN    Virtual Visit No    Medication changes reported     No    Fall or balance concerns reported    No    Tobacco Cessation No Change    Warm-up and Cool-down Performed as group-led instruction    Resistance Training Performed Yes    VAD Patient? No    PAD/SET Patient? No      Pain Assessment   Currently in Pain? No/denies    Multiple Pain Sites No             Capillary Blood Glucose: No results found for this or any previous visit (from the past 24 hour(s)).    Social History   Tobacco Use  Smoking Status Former   Packs/day: 0.50   Years: 41.00   Total pack years: 20.50   Types: Cigarettes   Quit date: 12/23/2020   Years since quitting: 0.7  Smokeless Tobacco Never    Goals Met:  Proper associated with RPD/PD & O2 Sat Exercise tolerated well Strength training completed today  Goals Unmet:  Not Applicable  Comments: Service time is from 1011 to 1135.    Dr. Rodman Pickle is Medical Director for Pulmonary Rehab at [facility].

## 2021-10-09 ENCOUNTER — Telehealth (HOSPITAL_COMMUNITY): Payer: Self-pay

## 2021-10-09 ENCOUNTER — Ambulatory Visit (HOSPITAL_COMMUNITY): Payer: Commercial Managed Care - PPO

## 2021-10-09 ENCOUNTER — Other Ambulatory Visit: Payer: Self-pay

## 2021-10-09 ENCOUNTER — Ambulatory Visit
Admission: RE | Admit: 2021-10-09 | Discharge: 2021-10-09 | Disposition: A | Discharge status: Home / Self Care | Payer: Commercial Managed Care - PPO | Source: Ambulatory Visit | Attending: Radiation Oncology | Admitting: Radiation Oncology

## 2021-10-09 DIAGNOSIS — C3411 Malignant neoplasm of upper lobe, right bronchus or lung: Secondary | ICD-10-CM

## 2021-10-09 DIAGNOSIS — Z510 Encounter for antineoplastic radiation therapy: Secondary | ICD-10-CM | POA: Diagnosis not present

## 2021-10-09 LAB — RAD ONC ARIA SESSION SUMMARY
Course Elapsed Days: 2
Plan Fractions Treated to Date: 3
Plan Prescribed Dose Per Fraction: 2 Gy
Plan Total Fractions Prescribed: 15
Plan Total Prescribed Dose: 30 Gy
Reference Point Dosage Given to Date: 6 Gy
Reference Point Session Dosage Given: 2 Gy
Session Number: 3

## 2021-10-09 MED ORDER — SONAFINE EX EMUL
1.0000 | Freq: Once | CUTANEOUS | Status: AC
  Administered 2021-10-09: 1 via TOPICAL

## 2021-10-09 NOTE — Telephone Encounter (Signed)
Called to schedule 6 MWT. Pt agreed with scheduled time.

## 2021-10-12 ENCOUNTER — Other Ambulatory Visit: Payer: Self-pay

## 2021-10-12 ENCOUNTER — Ambulatory Visit
Admission: RE | Admit: 2021-10-12 | Discharge: 2021-10-12 | Disposition: A | Discharge status: Home / Self Care | Payer: Commercial Managed Care - PPO | Source: Ambulatory Visit | Attending: Radiation Oncology | Admitting: Radiation Oncology

## 2021-10-12 DIAGNOSIS — Z510 Encounter for antineoplastic radiation therapy: Secondary | ICD-10-CM | POA: Diagnosis not present

## 2021-10-12 LAB — RAD ONC ARIA SESSION SUMMARY
Course Elapsed Days: 5
Plan Fractions Treated to Date: 4
Plan Prescribed Dose Per Fraction: 2 Gy
Plan Total Fractions Prescribed: 15
Plan Total Prescribed Dose: 30 Gy
Reference Point Dosage Given to Date: 8 Gy
Reference Point Session Dosage Given: 2 Gy
Session Number: 4

## 2021-10-13 ENCOUNTER — Other Ambulatory Visit: Payer: Self-pay

## 2021-10-13 ENCOUNTER — Encounter (HOSPITAL_COMMUNITY)
Admission: RE | Admit: 2021-10-13 | Discharge: 2021-10-13 | Disposition: A | Discharge status: Home / Self Care | Payer: Commercial Managed Care - PPO | Source: Ambulatory Visit | Attending: Emergency Medicine | Admitting: Emergency Medicine

## 2021-10-13 ENCOUNTER — Ambulatory Visit
Admission: RE | Admit: 2021-10-13 | Discharge: 2021-10-13 | Disposition: A | Discharge status: Home / Self Care | Payer: Commercial Managed Care - PPO | Source: Ambulatory Visit | Attending: Radiation Oncology | Admitting: Radiation Oncology

## 2021-10-13 DIAGNOSIS — Z510 Encounter for antineoplastic radiation therapy: Secondary | ICD-10-CM | POA: Diagnosis not present

## 2021-10-13 DIAGNOSIS — J432 Centrilobular emphysema: Secondary | ICD-10-CM

## 2021-10-13 DIAGNOSIS — C3411 Malignant neoplasm of upper lobe, right bronchus or lung: Secondary | ICD-10-CM

## 2021-10-13 LAB — RAD ONC ARIA SESSION SUMMARY
Course Elapsed Days: 6
Plan Fractions Treated to Date: 5
Plan Prescribed Dose Per Fraction: 2 Gy
Plan Total Fractions Prescribed: 15
Plan Total Prescribed Dose: 30 Gy
Reference Point Dosage Given to Date: 10 Gy
Reference Point Session Dosage Given: 2 Gy
Session Number: 5

## 2021-10-13 MED ORDER — RADIAPLEXRX EX GEL: Freq: Once | CUTANEOUS | Status: AC

## 2021-10-13 NOTE — Progress Notes (Signed)
Daily Session Note  Patient Details  Name: Julie Nicholson MRN: [id number] Date of Birth: 1966-01-20 Referring Provider:   April Manson Pulmonary Rehab Walk Test from 07/24/2021 in [place]  Referring Provider [name]       Encounter Date: 10/13/2021  Check In:  Session Check In - 10/13/21 1126       Check-In   Supervising physician immediately available to respond to emergencies [facility] immediately available    Physician(s) Dr. Erskine Emery    Location [facility]    Staff Present Rosebud Poles, RN, BSN;Carlette Wilber Oliphant, RN, BSN;Randi Reeve BS, ACSM-CEP, Exercise Physiologist;Kameran Mcneese Rosana Hoes, MS, ACSM-CEP, Exercise Physiologist    Virtual Visit No    Medication changes reported     No    Fall or balance concerns reported    No    Tobacco Cessation No Change    Warm-up and Cool-down Performed as group-led instruction    Resistance Training Performed Yes    VAD Patient? No    PAD/SET Patient? No      Pain Assessment   Currently in Pain? No/denies    Multiple Pain Sites No             Capillary Blood Glucose: No results found for this or any previous visit (from the past 24 hour(s)).    Social History   Tobacco Use  Smoking Status Former   Packs/day: 0.50   Years: 41.00   Total pack years: 20.50   Types: Cigarettes   Quit date: 12/23/2020   Years since quitting: 0.8  Smokeless Tobacco Never    Goals Met:  Proper associated with RPD/PD & O2 Sat Exercise tolerated well No report of concerns or symptoms today  Goals Unmet:  Not Applicable  Comments: Service time is from 0947 to 1005.    Dr. Rodman Pickle is Medical Director for Pulmonary Rehab at [facility].

## 2021-10-14 ENCOUNTER — Ambulatory Visit: Payer: Commercial Managed Care - PPO

## 2021-10-14 NOTE — Progress Notes (Signed)
Discharge Progress Report  Patient Details  Name: Julie Nicholson MRN: [id number] Date of Birth: 08-16-1965 Referring Provider:   April [facility] Walk Test from 07/24/2021 in [place]  Referring Provider [name]        Number of Visits: 17  Reason for Discharge:  Patient has met program and personal goals.  Smoking History:  Social History   Tobacco Use  Smoking Status Former   Packs/day: 0.50   Years: 41.00   Total pack years: 20.50   Types: Cigarettes   Quit date: 12/23/2020   Years since quitting: 0.8  Smokeless Tobacco Never    Diagnosis:  Centrilobular emphysema ([place])  ADL [facility]:  Pulmonary Assessment Scores     Row Name 07/24/21 0933 10/08/21 1608       ADL [facility]   ADL Phase -- Exit    SOB Score total 58 52      CAT Score   CAT Score 28 22      mMRC Score   mMRC Score 1 --             Initial Exercise Prescription:  Initial Exercise Prescription - 07/24/21 1000       Date of Initial Exercise RX and Referring Provider   Date 07/24/21    Referring Provider [name]    Expected Discharge Date 10/01/21      NuStep   Level 1    SPM 70    Minutes 15      Track   Minutes 15    METs 3.89      Prescription Details   Frequency (times per week) 2    Duration Progress to 30 minutes of continuous aerobic without signs/symptoms of physical distress      Intensity   THRR 40-80% of Max Heartrate 65-131    Ratings of Perceived Exertion 11-13    Perceived Dyspnea 0-4      Progression   Progression Continue to progress workloads to maintain intensity without signs/symptoms of physical distress.      Resistance Training   Training Prescription Yes    Weight red bands    Reps 10-15             Discharge Exercise Prescription (Final Exercise Prescription Changes):  Exercise Prescription Changes - 10/06/21 1200       Response to Exercise   Blood Pressure (Admit) 104/60    Blood Pressure  (Exercise) 114/70    Blood Pressure (Exit) 90/64    Heart Rate (Admit) 110 bpm    Heart Rate (Exercise) 123 bpm    Heart Rate (Exit) 106 bpm    Oxygen Saturation (Admit) 96 %    Oxygen Saturation (Exercise) 98 %    Oxygen Saturation (Exit) 96 %    Rating of Perceived Exertion (Exercise) 13    Perceived Dyspnea (Exercise) 1    Duration Continue with 30 min of aerobic exercise without signs/symptoms of physical distress.    Intensity THRR unchanged      Progression   Progression Continue to progress workloads to maintain intensity without signs/symptoms of physical distress.      Resistance Training   Training Prescription Yes    Weight Blue bands    Reps 10-15    Time 10 Minutes      Treadmill   MPH 2.5    Grade 1    Minutes 15    METs 3.26      NuStep   Level 4  SPM 80    Minutes 15    METs 4             Functional Capacity:  6 Minute Walk     Row Name 07/24/21 1012 10/13/21 1151       6 Minute Walk   Phase Initial Discharge    Distance 1400 feet 1825 feet    Distance % Change -- 30.36 %    Distance Feet Change -- 425 ft    Walk Time 6 minutes 6 minutes    # of Rest Breaks 0 0    MPH 2.65 3.46    METS 3.89 4.94    RPE 11 11    Perceived Dyspnea  1 1    VO2 Peak 13.6 17.29    Symptoms No No    Resting HR 87 bpm 104 bpm    Resting BP 104/50 102/58    Resting Oxygen Saturation  98 % 98 %    Exercise Oxygen Saturation  during 6 min walk 96 % 91 %    Max Ex. HR 103 bpm 130 bpm    Max Ex. BP 110/64 120/66    2 Minute Post BP 106/60 110/60      Interval HR   1 Minute HR 95 113    2 Minute HR 103 113    3 Minute HR 97 116    4 Minute HR 97 112    5 Minute HR 100 121    6 Minute HR 98 130    2 Minute Post HR 88 104    Interval Heart Rate? Yes Yes      Interval Oxygen   Interval Oxygen? Yes Yes    Baseline Oxygen Saturation % 98 % 98 %    1 Minute Oxygen Saturation % 98 % 94 %    1 Minute Liters of Oxygen 0 L 0 L    2 Minute Oxygen Saturation  % 96 % 99 %    2 Minute Liters of Oxygen 0 L 0 L    3 Minute Oxygen Saturation % 98 % 96 %    3 Minute Liters of Oxygen 0 L 0 L    4 Minute Oxygen Saturation % 93 % 97 %    4 Minute Liters of Oxygen 0 L 0 L    5 Minute Oxygen Saturation % 98 % 95 %    5 Minute Liters of Oxygen 0 L 0 L    6 Minute Oxygen Saturation % 98 % 91 %    6 Minute Liters of Oxygen 0 L 0 L    2 Minute Post Oxygen Saturation % 98 % 99 %    2 Minute Post Liters of Oxygen 0 L 0 L             Psychological, QOL, Others - Outcomes: PHQ 2/9:    10/13/2021    4:21 PM 07/24/2021    9:11 AM  Depression screen PHQ 2/9  Decreased Interest 0 0  Down, Depressed, Hopeless 0 0  PHQ - 2 Score 0 0  Altered sleeping 0 0  Tired, decreased energy 0 0  Change in appetite 0 0  Feeling bad or failure about yourself  0 0  Trouble concentrating 0 0  Moving slowly or fidgety/restless 0 0  Suicidal thoughts 0 0  PHQ-9 Score 0 0    Quality of Life:   Personal Goals: Goals established at orientation with interventions provided to work toward goal.  Personal Goals and Risk Factors at Admission - 07/24/21 0917       Core Components/Risk Factors/Patient Goals on Admission   Improve shortness of breath with ADL's Yes    Intervention Provide education, individualized exercise plan and daily activity instruction to help decrease symptoms of SOB with activities of daily living.    Expected Outcomes Short Term: Improve cardiorespiratory fitness to achieve a reduction of symptoms when performing ADLs;Long Term: Be able to perform more ADLs without symptoms or delay the onset of symptoms              Personal Goals Discharge:  Goals and Risk Factor Review     Row Name 08/12/21 1036 09/02/21 1537           Core Components/Risk Factors/Patient Goals Review   Personal Goals Review Improve shortness of breath with ADL's;Develop more efficient breathing techniques such as purse lipped breathing and diaphragmatic breathing  and practicing self-pacing with activity.;Increase knowledge of respiratory medications and ability to use respiratory devices properly. Improve shortness of breath with ADL's;Develop more efficient breathing techniques such as purse lipped breathing and diaphragmatic breathing and practicing self-pacing with activity.;Increase knowledge of respiratory medications and ability to use respiratory devices properly.      Review [name] is progressing well in the PR program.She is exercisng on the treadmill and nu-step. She started out on the track but has progressed to the treadmill.[name] seems to be enjoying the PR program. She is increasing her workloads and METS.She is motivated to exercise and push herself to improve.She has been exercising on room air with saturations of 95-99%.We had breathing techniques education since she has been attending. [name] has continued to progress well on the Nustep and treadmill. She has improved her workloads and METS. She is motivated to push herself to improve. She states that she is enjoying the program and is making connections with her classmates. Her room air saturations with exercisng  have been 94 to 97% and she reports mild SOB.      Expected Outcomes See admission goals.For her to continue to improve her METS and workloads. See admission goals               Exercise Goals and Review:  Exercise Goals     Row Name 07/24/21 0925 08/10/21 0917 09/02/21 1122 10/01/21 0821       Exercise Goals   Increase Physical Activity Yes Yes Yes Yes    Intervention Provide advice, education, support and counseling about physical activity/exercise needs.;Develop an individualized exercise prescription for aerobic and resistive training based on initial evaluation findings, risk stratification, comorbidities and participant's personal goals. Provide advice, education, support and counseling about physical activity/exercise needs.;Develop an individualized exercise prescription for  aerobic and resistive training based on initial evaluation findings, risk stratification, comorbidities and participant's personal goals. Provide advice, education, support and counseling about physical activity/exercise needs.;Develop an individualized exercise prescription for aerobic and resistive training based on initial evaluation findings, risk stratification, comorbidities and participant's personal goals. Provide advice, education, support and counseling about physical activity/exercise needs.;Develop an individualized exercise prescription for aerobic and resistive training based on initial evaluation findings, risk stratification, comorbidities and participant's personal goals.    Expected Outcomes Short Term: Attend rehab on a regular basis to increase amount of physical activity.;Long Term: Add in home exercise to make exercise part of routine and to increase amount of physical activity.;Long Term: Exercising regularly at least 3-5 days a week. Short Term: Attend rehab on a regular basis  to increase amount of physical activity.;Long Term: Add in home exercise to make exercise part of routine and to increase amount of physical activity.;Long Term: Exercising regularly at least 3-5 days a week. Short Term: Attend rehab on a regular basis to increase amount of physical activity.;Long Term: Add in home exercise to make exercise part of routine and to increase amount of physical activity.;Long Term: Exercising regularly at least 3-5 days a week. Short Term: Attend rehab on a regular basis to increase amount of physical activity.;Long Term: Add in home exercise to make exercise part of routine and to increase amount of physical activity.;Long Term: Exercising regularly at least 3-5 days a week.    Increase Strength and Stamina Yes Yes Yes Yes    Intervention Provide advice, education, support and counseling about physical activity/exercise needs.;Develop an individualized exercise prescription for aerobic  and resistive training based on initial evaluation findings, risk stratification, comorbidities and participant's personal goals. Provide advice, education, support and counseling about physical activity/exercise needs.;Develop an individualized exercise prescription for aerobic and resistive training based on initial evaluation findings, risk stratification, comorbidities and participant's personal goals. Provide advice, education, support and counseling about physical activity/exercise needs.;Develop an individualized exercise prescription for aerobic and resistive training based on initial evaluation findings, risk stratification, comorbidities and participant's personal goals. Provide advice, education, support and counseling about physical activity/exercise needs.;Develop an individualized exercise prescription for aerobic and resistive training based on initial evaluation findings, risk stratification, comorbidities and participant's personal goals.    Expected Outcomes Short Term: Increase workloads from initial exercise prescription for resistance, speed, and METs.;Short Term: Perform resistance training exercises routinely during rehab and add in resistance training at home;Long Term: Improve cardiorespiratory fitness, muscular endurance and strength as measured by increased METs and functional capacity (6MWT) Short Term: Increase workloads from initial exercise prescription for resistance, speed, and METs.;Short Term: Perform resistance training exercises routinely during rehab and add in resistance training at home;Long Term: Improve cardiorespiratory fitness, muscular endurance and strength as measured by increased METs and functional capacity (6MWT) Short Term: Increase workloads from initial exercise prescription for resistance, speed, and METs.;Short Term: Perform resistance training exercises routinely during rehab and add in resistance training at home;Long Term: Improve cardiorespiratory fitness,  muscular endurance and strength as measured by increased METs and functional capacity (6MWT) Short Term: Increase workloads from initial exercise prescription for resistance, speed, and METs.;Short Term: Perform resistance training exercises routinely during rehab and add in resistance training at home;Long Term: Improve cardiorespiratory fitness, muscular endurance and strength as measured by increased METs and functional capacity (6MWT)    Able to understand and use rate of perceived exertion (RPE) scale Yes Yes Yes Yes    Intervention Provide education and explanation on how to use RPE scale Provide education and explanation on how to use RPE scale Provide education and explanation on how to use RPE scale Provide education and explanation on how to use RPE scale    Expected Outcomes Short Term: Able to use RPE daily in rehab to express subjective intensity level;Long Term:  Able to use RPE to guide intensity level when exercising independently Short Term: Able to use RPE daily in rehab to express subjective intensity level;Long Term:  Able to use RPE to guide intensity level when exercising independently Short Term: Able to use RPE daily in rehab to express subjective intensity level;Long Term:  Able to use RPE to guide intensity level when exercising independently Short Term: Able to use RPE daily in rehab to express  subjective intensity level;Long Term:  Able to use RPE to guide intensity level when exercising independently    Able to understand and use Dyspnea scale Yes Yes Yes Yes    Intervention Provide education and explanation on how to use Dyspnea scale Provide education and explanation on how to use Dyspnea scale Provide education and explanation on how to use Dyspnea scale Provide education and explanation on how to use Dyspnea scale    Expected Outcomes Short Term: Able to use Dyspnea scale daily in rehab to express subjective sense of shortness of breath during exertion;Long Term: Able to use  Dyspnea scale to guide intensity level when exercising independently Short Term: Able to use Dyspnea scale daily in rehab to express subjective sense of shortness of breath during exertion;Long Term: Able to use Dyspnea scale to guide intensity level when exercising independently Short Term: Able to use Dyspnea scale daily in rehab to express subjective sense of shortness of breath during exertion;Long Term: Able to use Dyspnea scale to guide intensity level when exercising independently Short Term: Able to use Dyspnea scale daily in rehab to express subjective sense of shortness of breath during exertion;Long Term: Able to use Dyspnea scale to guide intensity level when exercising independently    Knowledge and understanding of Target Heart Rate Range (THRR) Yes Yes Yes Yes    Intervention Provide education and explanation of THRR including how the numbers were predicted and where they are located for reference Provide education and explanation of THRR including how the numbers were predicted and where they are located for reference Provide education and explanation of THRR including how the numbers were predicted and where they are located for reference Provide education and explanation of THRR including how the numbers were predicted and where they are located for reference    Expected Outcomes Short Term: Able to state/look up THRR;Long Term: Able to use THRR to govern intensity when exercising independently;Short Term: Able to use daily as guideline for intensity in rehab Short Term: Able to state/look up THRR;Long Term: Able to use THRR to govern intensity when exercising independently;Short Term: Able to use daily as guideline for intensity in rehab Short Term: Able to state/look up THRR;Long Term: Able to use THRR to govern intensity when exercising independently;Short Term: Able to use daily as guideline for intensity in rehab Short Term: Able to state/look up THRR;Long Term: Able to use THRR to govern  intensity when exercising independently;Short Term: Able to use daily as guideline for intensity in rehab    Understanding of Exercise Prescription Yes Yes Yes Yes    Intervention Provide education, explanation, and written materials on patient's individual exercise prescription Provide education, explanation, and written materials on patient's individual exercise prescription Provide education, explanation, and written materials on patient's individual exercise prescription Provide education, explanation, and written materials on patient's individual exercise prescription    Expected Outcomes Short Term: Able to explain program exercise prescription;Long Term: Able to explain home exercise prescription to exercise independently Short Term: Able to explain program exercise prescription;Long Term: Able to explain home exercise prescription to exercise independently Short Term: Able to explain program exercise prescription;Long Term: Able to explain home exercise prescription to exercise independently Short Term: Able to explain program exercise prescription;Long Term: Able to explain home exercise prescription to exercise independently             Exercise Goals Re-Evaluation:  Exercise Goals Re-Evaluation     Row Name 08/10/21 [phone] 09/02/21 1122 10/01/21 6789  Exercise Goal Re-Evaluation   Exercise Goals Review Increase Physical Activity;Increase Strength and Stamina;Able to understand and use rate of perceived exertion (RPE) scale;Able to understand and use Dyspnea scale;Knowledge and understanding of Target Heart Rate Range (THRR);Understanding of Exercise Prescription Increase Physical Activity;Increase Strength and Stamina;Able to understand and use rate of perceived exertion (RPE) scale;Able to understand and use Dyspnea scale;Knowledge and understanding of Target Heart Rate Range (THRR);Understanding of Exercise Prescription Increase Physical Activity;Increase Strength and  Stamina;Able to understand and use rate of perceived exertion (RPE) scale;Able to understand and use Dyspnea scale;Knowledge and understanding of Target Heart Rate Range (THRR);Understanding of Exercise Prescription     Comments [name] has completed 2 exercise sessions. She exercises for 15 min on the treadmill and Nustep. Kim averages 2.45 METs at 1.9 mph and 0% incline on the treadmill and 2.2 METs at level 2 on the Nustep. [name] has been progressed from the track to the treadmill as she tolerates this fair. She has not walked on a treadmill, so there is a learning curve. We will still work on proper gait when walking on the treadmill. Kim performs the warmup and cooldown standing without limitations. She is motivated to exercise and improve her functional capacity. Will continue to monitor and progress as able. [name] has completed 6 exercise sessions. She exercises for 15 min on the treadmill and Nustep. Kim averages 2.61 METs, 2.1 mph @ 0, on the treadmill and 3.0 METs at level 3 on hte Nustep. Kim performs the warmup and cooldown standing without any limitations. She has missed a few sessions due to a vacation. I have been unable to progress her because of her vacation. Will continue to monitor and progress as able. [name] has completed 14 exercise sessions. She exercises for 15 min on the treadmill and Nustep. Kim averages 2.91 METs at 2.5 mph and 0 incline on the treadmill and 3.5 METs at level 4 on the Nustep. She has increased her workload for the Nustep but not the treadmill. Will consider increasing her incline on the treadmill. [name] is very motivated to exercise. We have discussed home exercise as [name] is meeting physical activity guidelines. Will continue to monitor and progress as able.     Expected Outcomes Through exercise at rehab and home, the patient will decrease shortness of breath with daily activities and feel confident in carrying out an exercise regimen at home. Through exercise at rehab and home, the  patient will decrease shortness of breath with daily activities and feel confident in carrying out an exercise regimen at home. Through exercise at rehab and home, the patient will decrease shortness of breath with daily activities and feel confident in carrying out an exercise regimen at home.              Nutrition & Weight - Outcomes:   Post Biometrics - 07/24/21 1016        Post  Biometrics   Grip Strength 17 kg             Nutrition:  Nutrition Therapy & Goals - 09/29/21 1145       Nutrition Therapy   Diet General Healthy Diet    Drug/Food Interactions Statins/Certain Fruits      Personal Nutrition Goals   Nutrition Goal Patient to choose a daily variety of fruits, vegetables, whole grains, lean protein/plant protein, and dairy products as part of heart healthy diet.    Personal Goal #2 Patient to understand strategies for weight gain of 0.5-2.0# per week.  Comments Goals in progress. Patient continues 1-2 Ensure daily ( up to 440 calories, 18g protein) to aid with weight gain/weight maintenance. She reports good appetite. Patient continues to maintain weight. Discussed strategies for weight maintenance including high calorie nutrition supplements or increasing supplement frequency, etc to aid with weight maintenance during upcoming planned radiation. Patient amicable to RD suggestions.      Intervention Plan   Intervention Prescribe, educate and counsel regarding individualized specific dietary modifications aiming towards targeted core components such as weight, hypertension, lipid management, diabetes, heart failure and other comorbidities.    Expected Outcomes Short Term Goal: Understand basic principles of dietary content, such as calories, fat, sodium, cholesterol and nutrients.;Long Term Goal: Adherence to prescribed nutrition plan.             Nutrition Discharge:   Education Questionnaire Score:  Knowledge Questionnaire Score - 10/08/21 1607        Knowledge Questionnaire Score   Post Score 16/18             Goals reviewed with patient; copy given to patient.

## 2021-10-15 ENCOUNTER — Ambulatory Visit: Payer: Commercial Managed Care - PPO | Admitting: Physician Assistant

## 2021-10-15 ENCOUNTER — Ambulatory Visit
Admission: RE | Admit: 2021-10-15 | Discharge: 2021-10-15 | Disposition: A | Discharge status: Home / Self Care | Payer: Commercial Managed Care - PPO | Source: Ambulatory Visit | Attending: Radiation Oncology | Admitting: Radiation Oncology

## 2021-10-15 ENCOUNTER — Other Ambulatory Visit: Payer: Self-pay

## 2021-10-15 DIAGNOSIS — Z510 Encounter for antineoplastic radiation therapy: Secondary | ICD-10-CM | POA: Diagnosis not present

## 2021-10-15 LAB — RAD ONC ARIA SESSION SUMMARY
Course Elapsed Days: 8
Plan Fractions Treated to Date: 6
Plan Prescribed Dose Per Fraction: 2 Gy
Plan Total Fractions Prescribed: 15
Plan Total Prescribed Dose: 30 Gy
Reference Point Dosage Given to Date: 12 Gy
Reference Point Session Dosage Given: 2 Gy
Session Number: 6

## 2021-10-16 ENCOUNTER — Other Ambulatory Visit: Payer: Self-pay

## 2021-10-16 ENCOUNTER — Ambulatory Visit
Admission: RE | Admit: 2021-10-16 | Discharge: 2021-10-16 | Disposition: A | Discharge status: Home / Self Care | Payer: Commercial Managed Care - PPO | Source: Ambulatory Visit | Attending: Radiation Oncology | Admitting: Radiation Oncology

## 2021-10-16 DIAGNOSIS — Z510 Encounter for antineoplastic radiation therapy: Secondary | ICD-10-CM | POA: Diagnosis not present

## 2021-10-16 LAB — RAD ONC ARIA SESSION SUMMARY
Course Elapsed Days: 9
Plan Fractions Treated to Date: 7
Plan Prescribed Dose Per Fraction: 2 Gy
Plan Total Fractions Prescribed: 15
Plan Total Prescribed Dose: 30 Gy
Reference Point Dosage Given to Date: 14 Gy
Reference Point Session Dosage Given: 2 Gy
Session Number: 7

## 2021-10-19 ENCOUNTER — Ambulatory Visit
Admission: RE | Admit: 2021-10-19 | Discharge: 2021-10-19 | Disposition: A | Discharge status: Home / Self Care | Payer: Commercial Managed Care - PPO | Source: Ambulatory Visit | Attending: Radiation Oncology | Admitting: Radiation Oncology

## 2021-10-19 ENCOUNTER — Other Ambulatory Visit: Payer: Self-pay

## 2021-10-19 DIAGNOSIS — Z510 Encounter for antineoplastic radiation therapy: Secondary | ICD-10-CM | POA: Diagnosis not present

## 2021-10-19 LAB — RAD ONC ARIA SESSION SUMMARY
Course Elapsed Days: 12
Plan Fractions Treated to Date: 8
Plan Prescribed Dose Per Fraction: 2 Gy
Plan Total Fractions Prescribed: 15
Plan Total Prescribed Dose: 30 Gy
Reference Point Dosage Given to Date: 16 Gy
Reference Point Session Dosage Given: 2 Gy
Session Number: 8

## 2021-10-20 ENCOUNTER — Other Ambulatory Visit: Payer: Self-pay

## 2021-10-20 ENCOUNTER — Ambulatory Visit
Admission: RE | Admit: 2021-10-20 | Discharge: 2021-10-20 | Disposition: A | Discharge status: Home / Self Care | Payer: Commercial Managed Care - PPO | Source: Ambulatory Visit | Attending: Radiation Oncology | Admitting: Radiation Oncology

## 2021-10-20 ENCOUNTER — Other Ambulatory Visit: Payer: Self-pay | Admitting: Radiation Oncology

## 2021-10-20 DIAGNOSIS — Z510 Encounter for antineoplastic radiation therapy: Secondary | ICD-10-CM | POA: Diagnosis not present

## 2021-10-20 LAB — RAD ONC ARIA SESSION SUMMARY
Course Elapsed Days: 13
Plan Fractions Treated to Date: 9
Plan Prescribed Dose Per Fraction: 2 Gy
Plan Total Fractions Prescribed: 15
Plan Total Prescribed Dose: 30 Gy
Reference Point Dosage Given to Date: 18 Gy
Reference Point Session Dosage Given: 2 Gy
Session Number: 9

## 2021-10-20 MED ORDER — SUCRALFATE 1 G PO TABS: 1.0000 g | ORAL_TABLET | Freq: Three times a day (TID) | ORAL | 1 refills | Status: DC

## 2021-10-21 ENCOUNTER — Other Ambulatory Visit: Payer: Self-pay

## 2021-10-21 ENCOUNTER — Ambulatory Visit
Admission: RE | Admit: 2021-10-21 | Discharge: 2021-10-21 | Disposition: A | Discharge status: Home / Self Care | Payer: Commercial Managed Care - PPO | Source: Ambulatory Visit | Attending: Radiation Oncology | Admitting: Radiation Oncology

## 2021-10-21 DIAGNOSIS — Z510 Encounter for antineoplastic radiation therapy: Secondary | ICD-10-CM | POA: Diagnosis not present

## 2021-10-21 LAB — RAD ONC ARIA SESSION SUMMARY
Course Elapsed Days: 14
Plan Fractions Treated to Date: 10
Plan Prescribed Dose Per Fraction: 2 Gy
Plan Total Fractions Prescribed: 15
Plan Total Prescribed Dose: 30 Gy
Reference Point Dosage Given to Date: 20 Gy
Reference Point Session Dosage Given: 2 Gy
Session Number: 10

## 2021-10-22 ENCOUNTER — Other Ambulatory Visit: Payer: Self-pay

## 2021-10-22 ENCOUNTER — Ambulatory Visit
Admission: RE | Admit: 2021-10-22 | Discharge: 2021-10-22 | Disposition: A | Discharge status: Home / Self Care | Payer: Commercial Managed Care - PPO | Source: Ambulatory Visit | Attending: Radiation Oncology | Admitting: Radiation Oncology

## 2021-10-22 DIAGNOSIS — Z510 Encounter for antineoplastic radiation therapy: Secondary | ICD-10-CM | POA: Diagnosis not present

## 2021-10-22 LAB — RAD ONC ARIA SESSION SUMMARY
Course Elapsed Days: 15
Plan Fractions Treated to Date: 11
Plan Prescribed Dose Per Fraction: 2 Gy
Plan Total Fractions Prescribed: 15
Plan Total Prescribed Dose: 30 Gy
Reference Point Dosage Given to Date: 22 Gy
Reference Point Session Dosage Given: 2 Gy
Session Number: 11

## 2021-10-23 ENCOUNTER — Other Ambulatory Visit: Payer: Self-pay

## 2021-10-23 ENCOUNTER — Ambulatory Visit
Admission: RE | Admit: 2021-10-23 | Discharge: 2021-10-23 | Disposition: A | Discharge status: Home / Self Care | Payer: Commercial Managed Care - PPO | Source: Ambulatory Visit | Attending: Radiation Oncology | Admitting: Radiation Oncology

## 2021-10-23 DIAGNOSIS — Z510 Encounter for antineoplastic radiation therapy: Secondary | ICD-10-CM | POA: Diagnosis not present

## 2021-10-23 LAB — RAD ONC ARIA SESSION SUMMARY
Course Elapsed Days: 16
Plan Fractions Treated to Date: 12
Plan Prescribed Dose Per Fraction: 2 Gy
Plan Total Fractions Prescribed: 15
Plan Total Prescribed Dose: 30 Gy
Reference Point Dosage Given to Date: 24 Gy
Reference Point Session Dosage Given: 2 Gy
Session Number: 12

## 2021-10-26 ENCOUNTER — Other Ambulatory Visit: Payer: Self-pay

## 2021-10-26 ENCOUNTER — Ambulatory Visit
Admission: RE | Admit: 2021-10-26 | Discharge: 2021-10-26 | Disposition: A | Discharge status: Home / Self Care | Payer: Commercial Managed Care - PPO | Source: Ambulatory Visit | Attending: Radiation Oncology | Admitting: Radiation Oncology

## 2021-10-26 DIAGNOSIS — Z510 Encounter for antineoplastic radiation therapy: Secondary | ICD-10-CM | POA: Diagnosis not present

## 2021-10-26 LAB — RAD ONC ARIA SESSION SUMMARY
Course Elapsed Days: 19
Plan Fractions Treated to Date: 13
Plan Prescribed Dose Per Fraction: 2 Gy
Plan Total Fractions Prescribed: 15
Plan Total Prescribed Dose: 30 Gy
Reference Point Dosage Given to Date: 26 Gy
Reference Point Session Dosage Given: 2 Gy
Session Number: 13

## 2021-10-27 ENCOUNTER — Ambulatory Visit
Admission: RE | Admit: 2021-10-27 | Discharge: 2021-10-27 | Disposition: A | Discharge status: Home / Self Care | Payer: Commercial Managed Care - PPO | Source: Ambulatory Visit | Attending: Radiation Oncology | Admitting: Radiation Oncology

## 2021-10-27 ENCOUNTER — Other Ambulatory Visit: Payer: Self-pay

## 2021-10-27 DIAGNOSIS — Z510 Encounter for antineoplastic radiation therapy: Secondary | ICD-10-CM | POA: Diagnosis not present

## 2021-10-27 LAB — RAD ONC ARIA SESSION SUMMARY
Course Elapsed Days: 20
Plan Fractions Treated to Date: 14
Plan Prescribed Dose Per Fraction: 2 Gy
Plan Total Fractions Prescribed: 15
Plan Total Prescribed Dose: 30 Gy
Reference Point Dosage Given to Date: 28 Gy
Reference Point Session Dosage Given: 2 Gy
Session Number: 14

## 2021-10-28 ENCOUNTER — Other Ambulatory Visit: Payer: Self-pay

## 2021-10-28 ENCOUNTER — Ambulatory Visit
Admission: RE | Admit: 2021-10-28 | Discharge: 2021-10-28 | Disposition: A | Discharge status: Home / Self Care | Payer: Commercial Managed Care - PPO | Source: Ambulatory Visit | Attending: Radiation Oncology | Admitting: Radiation Oncology

## 2021-10-28 ENCOUNTER — Ambulatory Visit: Payer: Commercial Managed Care - PPO

## 2021-10-28 DIAGNOSIS — Z510 Encounter for antineoplastic radiation therapy: Secondary | ICD-10-CM | POA: Diagnosis not present

## 2021-10-28 LAB — RAD ONC ARIA SESSION SUMMARY
Course Elapsed Days: 21
Plan Fractions Treated to Date: 15
Plan Prescribed Dose Per Fraction: 2 Gy
Plan Total Fractions Prescribed: 15
Plan Total Prescribed Dose: 30 Gy
Reference Point Dosage Given to Date: 30 Gy
Reference Point Session Dosage Given: 2 Gy
Session Number: 15

## 2021-10-29 ENCOUNTER — Telehealth: Payer: Self-pay

## 2021-10-29 ENCOUNTER — Ambulatory Visit: Payer: Commercial Managed Care - PPO

## 2021-10-29 NOTE — Telephone Encounter (Signed)
Notified Patient of completion of disability forms. Fax transmission confirmation received. Copy of forms mailed to patient as requested. No other needs or concerns voiced at this time.

## 2021-10-30 ENCOUNTER — Ambulatory Visit: Payer: Commercial Managed Care - PPO

## 2021-11-02 ENCOUNTER — Ambulatory Visit: Payer: Commercial Managed Care - PPO

## 2021-11-03 ENCOUNTER — Ambulatory Visit: Payer: Commercial Managed Care - PPO

## 2021-11-03 ENCOUNTER — Other Ambulatory Visit: Payer: Self-pay | Admitting: Emergency Medicine

## 2021-11-04 ENCOUNTER — Ambulatory Visit: Payer: Commercial Managed Care - PPO

## 2021-11-05 ENCOUNTER — Ambulatory Visit: Payer: Commercial Managed Care - PPO

## 2021-11-06 ENCOUNTER — Ambulatory Visit: Payer: Commercial Managed Care - PPO

## 2021-11-09 ENCOUNTER — Ambulatory Visit: Payer: Commercial Managed Care - PPO

## 2021-11-10 ENCOUNTER — Ambulatory Visit: Payer: Commercial Managed Care - PPO

## 2021-11-11 ENCOUNTER — Encounter: Payer: Self-pay | Admitting: Emergency Medicine

## 2021-11-11 ENCOUNTER — Ambulatory Visit (INDEPENDENT_AMBULATORY_CARE_PROVIDER_SITE_OTHER): Payer: Commercial Managed Care - PPO | Admitting: Emergency Medicine

## 2021-11-11 ENCOUNTER — Ambulatory Visit: Payer: Commercial Managed Care - PPO

## 2021-11-11 DIAGNOSIS — J0110 Acute frontal sinusitis, unspecified: Secondary | ICD-10-CM

## 2021-11-11 DIAGNOSIS — J449 Chronic obstructive pulmonary disease, unspecified: Secondary | ICD-10-CM | POA: Diagnosis not present

## 2021-11-11 DIAGNOSIS — J432 Centrilobular emphysema: Secondary | ICD-10-CM

## 2021-11-11 DIAGNOSIS — J3089 Other allergic rhinitis: Secondary | ICD-10-CM

## 2021-11-11 DIAGNOSIS — C3411 Malignant neoplasm of upper lobe, right bronchus or lung: Secondary | ICD-10-CM | POA: Diagnosis not present

## 2021-11-11 DIAGNOSIS — J019 Acute sinusitis, unspecified: Secondary | ICD-10-CM | POA: Insufficient documentation

## 2021-11-11 HISTORY — DX: Acute sinusitis, unspecified: J01.90

## 2021-11-11 MED ORDER — AMOXICILLIN-POT CLAVULANATE 875-125 MG PO TABS: 1.0000 | ORAL_TABLET | Freq: Two times a day (BID) | ORAL | 0 refills | Status: DC

## 2021-11-11 MED ORDER — ALBUTEROL SULFATE HFA 108 (90 BASE) MCG/ACT IN AERS: 1.0000 | INHALATION_SPRAY | Freq: Four times a day (QID) | RESPIRATORY_TRACT | 3 refills | Status: DC | PRN

## 2021-11-11 NOTE — Progress Notes (Signed)
Subjective:    Patient ID: Julie Nicholson, female    DOB: [birth year], 56 y.o.   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  She was seen 1 month ago in our office with nonresolving flare of her COPD, chest discomfort.  She was treated with antibiotics, extended steroids and underwent a CTPA on 4/26 as below Currently managed on Trelegy (changed last month from spiriva).  She is on Protonix and Pepcid, loratadine Flonase as needed No desat on walking oximetry today.  She is a bit better - less cough. Her dyspnea remains severe. 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  New right upper lobe and right lower lobe interstitial thickening that is obscuring her previously seen right upper lobe nodule, likely due to her XRT but difficult to exclude coexisting pneumonia  Pulmonary function testing performed today and reviewed by me shows grossly normal airflows although there may be some evidence for mild obstruction.  No bronchodilator response.  Possible restriction based on decreased RV.  The TLC is normal.  Her diffusion capacity is decreased and corrects to the normal range when adjusted for alveolar volume.  ROV 08/12/2021 --follow-up visit 56 year old woman with new diagnosis of right upper lobe and mediastinal adenocarcinoma treated with chemoradiation and then Imfinzi - stopped due to possible pneumonitis.  Mild obstruction on pulmonary function testing from 06/2021.  She does have some evolving scar in the right upper lobe posttreatment, question pneumonitis which was treated with a prolonged prednisone taper.  I tried changing Trelegy to [employer] at her last visit.  Today she reports that she still  has mid upper chest discomfort and pressure. She feels that she has benefited from the [facility], has started pulmonary rehab.has some SOB w exertion. Her cough is present but better.  She is due for a repeat Ct chest in August. No desaturations w exertion noted.   ROV 11/11/2021 --Julie Nicholson is 67 with a history of right upper lobe mediastinal adenocarcinoma that was treated with chemoradiation, then Imfinzi that was stopped due to associated pneumonitis.  She has mild obstructive lung disease.  We have been managing her on Stiolto.  She uses albuterol approximately. She reports today that she is having cough ever since XRT, non-productive. Having nasal congestion and obstruction for the last several days - sick contact from daughter who had a URI. She is on flonase, loratadine. Remains on Stiolto. Rare albuterol use. Activity level remains good. She did pulm rehab and did benefit, no desaturations.  Flu shot up to date. Has not had the covid vaccine yet   Review of Systems As per HPI  Past Medical History:  Diagnosis Date   Abdominal discomfort    Cancer ([place])    Chronic headaches    due to allergies, sinus   COPD (chronic obstructive pulmonary disease) ([place])    per 2012 chest xray   pt states she doesn not have this now (04/10/2013)   Deviated nasal septum    Eustachian tube dysfunction    GERD (gastroesophageal reflux disease)    occasional uses Tums / Rolaids   Hearing loss    right ear   High cholesterol    History of radiation therapy    right lung 01/07/2021-02/19/2021  Dr Gery Pray   Migraine    "only once in a blue moon since RX'd allergy shots" (04/10/2013)   Pancreatitis 02/08/2013   Pneumonia    Rhinitis, allergic      Family History  Problem Relation Age of Onset   Diabetes Father    Diabetes Mother    COPD Mother    COPD Maternal Grandfather    COPD Maternal Aunt    COPD Maternal Uncle      Social History   Socioeconomic History   Marital status: Married    Spouse  name: Not on file   Number of children: Not on file   Years of education: 12   Highest education level: 12th grade  Occupational History   Not on file  Tobacco Use   Smoking status: Former    Packs/day: 0.50    Years: 41.00    Total pack years: 20.50    Types: Cigarettes    Quit date: 12/23/2020    Years since quitting: 0.8   Smokeless tobacco: Never  Vaping Use   Vaping Use: Never used  Substance and Sexual Activity   Alcohol use: Not Currently    Comment: occasional   Drug use: No   Sexual activity: Yes    Birth control/protection: Surgical    Comment: Hysterectomy  Other Topics Concern   Not on file  Social History Narrative   Not on file   Social Determinants of Health   Financial Resource Strain: Not on file  Food Insecurity: Not on file  Transportation Needs: Not on file  Physical Activity: Not on file  Stress: Not on file  Social Connections: Not on file  Intimate Partner Violence: Not on file     Allergies  Allergen Reactions   Benadryl [Diphenhydramine] Other (See Comments)    Oversedation - 50 mg IV given 01/05/21; switched to IV Certirizine.     Outpatient Medications Prior to Visit  Medication Sig Dispense Refill   acetaminophen (TYLENOL) 500 MG tablet Take 1,000 mg by mouth every 6 (six) hours as needed for moderate pain.     albuterol (PROVENTIL) (2.5 MG/3ML) 0.083% nebulizer solution albuterol sulfate 2.5 mg/3 mL (0.083 %) solution for nebulization  USE 1 VIAL IN NEBULIZER EVERY 6 HOURS AS NEEDED FOR WHEEZING OR SHORTNESS OF BREATH     albuterol (VENTOLIN HFA) 108 (90 Base) MCG/ACT inhaler Inhale 1-2 puffs into the lungs every 6 (six) hours as needed for wheezing or shortness of breath. 6.7 g 5   aspirin EC 81 MG tablet Take 81 mg by mouth at bedtime.     atorvastatin (LIPITOR) 40 MG tablet Take 40 mg by mouth at bedtime.     benzonatate (TESSALON) 200 MG capsule Take 200 mg by mouth 3 (three) times daily as needed.     Biotin 5000 MCG TABS Take  5,000 mcg by mouth at bedtime.     buPROPion (WELLBUTRIN SR) 150 MG 12 hr tablet Take 1 tablet by mouth twice daily 60 tablet 0   Ca Carbonate-Mag Hydroxide (ROLAIDS PO) Take 1 tablet by mouth daily as needed (heartburn).     fluticasone (FLONASE) 50 MCG/ACT nasal spray Place 2 sprays into both nostrils daily as needed for allergies.     Fluticasone-Umeclidin-Vilant (TRELEGY ELLIPTA) 200-62.5-25 MCG/ACT AEPB INHALE 1 PUFF ONCE DAILY 60 each 2   loratadine (CLARITIN) 10 MG tablet Take 1 tablet by mouth once daily 30 tablet 11   Multiple Vitamins-Minerals (HAIR SKIN AND NAILS FORMULA PO) Take 1  tablet by mouth daily.     ondansetron (ZOFRAN-ODT) 8 MG disintegrating tablet Take 8 mg by mouth 3 (three) times daily as needed.     Oxycodone HCl 10 MG TABS Take 1 tablet by mouth 3 (three) times daily as needed (pain).     sucralfate (CARAFATE) 1 g tablet Take 1 tablet (1 g total) by mouth 4 (four) times daily -  with meals and at bedtime. Crush and dissolve in 10 mL of warm water prior to swallowing 120 tablet 1   Tiotropium Bromide-Olodaterol (STIOLTO RESPIMAT) 2.5-2.5 MCG/ACT AERS Inhale 2 puffs into the lungs daily. 4 g 3   UBRELVY 100 MG TABS Take 100 mg by mouth daily as needed (migraine).     benzonatate (TESSALON) 100 MG capsule Take 1-2 capsules (100-200 mg total) by mouth 3 (three) times daily as needed for cough. 30 capsule 0   No facility-administered medications prior to visit.        Objective:   Physical Exam Vitals:   11/11/21 0855  BP: 104/60  Pulse: 95  Temp: 97.9 F (36.6 C)  TempSrc: Oral  SpO2: 98%  Weight: 111 lb 12.8 oz (50.7 kg)  Height: 5\' 2"  (1.575 m)    Gen: Pleasant, well-nourished, in no distress,  normal affect  ENT: No lesions,  mouth clear,  oropharynx clear, no postnasal drip  Neck: No JVD, no stridor  Lungs: No use of accessory muscles, few focal R insp crackles, no wheeze on forced expiration  Cardiovascular: RRR, heart sounds normal, no murmur or  gallops, no peripheral edema  Musculoskeletal: No deformities, no cyanosis or clubbing  Neuro: alert, awake, non focal  Skin: Warm, no lesions or rash       Assessment & Plan:   COPD (chronic obstructive pulmonary disease) (HCC) Continue your Stiolto 2 puffs once daily. Keep your albuterol available to use 2 puffs when needed for shortness of breath, chest tightness, wheezing. Flu shot is up-to-date Agree with getting the COVID-19 vaccine this fall You would probably benefit from the RSV vaccine as well. Follow with Dr Lamonte Sakai in 6 months or sooner if you have any problems  Allergic rhinitis Fluticasone nasal spray, loratadine as ordered  Primary adenocarcinoma of upper lobe of right lung [facility]) Follow-up planned with Dr. Julien Nordmann and with radiation oncology.  Repeat imaging as per their plans  Acute sinusitis Patient with purulent nasal drainage following a URI, also with chronic allergic disease.  Headache.  No fevers.  Plan to treat with a short course of Augmentin.  Continue her fluticasone and loratadine.  Could consider nasal saline rinses going forward.    Baltazar Apo, MD, PhD 11/11/2021, 9:29 AM [facility] [phone] or if no answer before 7:00PM call [phone] For any issues after 7:00PM please call eLink [phone]

## 2021-11-11 NOTE — Assessment & Plan Note (Signed)
Fluticasone nasal spray, loratadine as ordered

## 2021-11-11 NOTE — Assessment & Plan Note (Signed)
Continue your Stiolto 2 puffs once daily. Keep your albuterol available to use 2 puffs when needed for shortness of breath, chest tightness, wheezing. Flu shot is up-to-date Agree with getting the COVID-19 vaccine this fall You would probably benefit from the RSV vaccine as well. Follow with Dr Lamonte Sakai in 6 months or sooner if you have any problems

## 2021-11-11 NOTE — Assessment & Plan Note (Signed)
Follow-up planned with Dr. Julien Nordmann and with radiation oncology.  Repeat imaging as per their plans

## 2021-11-11 NOTE — Patient Instructions (Addendum)
Please take Augmentin twice a day for 7 days Continue your Stiolto 2 puffs once daily. Keep your albuterol available to use 2 puffs when needed for shortness of breath, chest tightness, wheezing. Flu shot is up-to-date Agree with getting the COVID-19 vaccine this fall You would probably benefit from the RSV vaccine as well. Please continue fluticasone nasal spray, 2 sprays each nostril once daily. Continue loratadine 10 mg once daily. Follow with Dr. Julien Nordmann and get your repeat chest imaging as planned Follow with Dr Lamonte Sakai in 6 months or sooner if you have any problems

## 2021-11-11 NOTE — Assessment & Plan Note (Signed)
Patient with purulent nasal drainage following a URI, also with chronic allergic disease.  Headache.  No fevers.  Plan to treat with a short course of Augmentin.  Continue her fluticasone and loratadine.  Could consider nasal saline rinses going forward.

## 2021-11-12 ENCOUNTER — Other Ambulatory Visit: Payer: Self-pay

## 2021-11-12 ENCOUNTER — Ambulatory Visit: Payer: Commercial Managed Care - PPO

## 2021-11-13 ENCOUNTER — Ambulatory Visit: Payer: Commercial Managed Care - PPO

## 2021-11-13 ENCOUNTER — Telehealth: Payer: Self-pay | Admitting: Emergency Medicine

## 2021-11-13 MED ORDER — BUPROPION HCL ER (SR) 150 MG PO TB12: 150.0000 mg | ORAL_TABLET | Freq: Two times a day (BID) | ORAL | 0 refills | Status: DC

## 2021-11-13 NOTE — Telephone Encounter (Signed)
Dr. Lamonte Sakai, please advise if you are okay with sending a 61-month supply of pt's Wellbutrin to [facility] off [facility].

## 2021-11-13 NOTE — Telephone Encounter (Signed)
Yes please

## 2021-11-13 NOTE — Telephone Encounter (Signed)
Rx for pt's wellbutrin has been sent as a 90-day supply for pt. Attempted to call pt but unable to reach. Left a detailed message for pt letting her know this was done. Nothing further needed.

## 2021-11-16 ENCOUNTER — Telehealth: Payer: Self-pay | Admitting: Emergency Medicine

## 2021-11-16 ENCOUNTER — Ambulatory Visit: Payer: Commercial Managed Care - PPO

## 2021-11-17 ENCOUNTER — Ambulatory Visit: Payer: Commercial Managed Care - PPO

## 2021-11-17 ENCOUNTER — Telehealth: Payer: Self-pay | Admitting: Emergency Medicine

## 2021-11-17 NOTE — Telephone Encounter (Signed)
Patient provided Continuation of Disability form to be completed by Dr. Lamonte Sakai.  I've filled in as much as I can and sent form back for Dr. Lamonte Sakai to review and sign when he is in the office on 10/18.

## 2021-11-18 ENCOUNTER — Ambulatory Visit: Payer: Commercial Managed Care - PPO

## 2021-11-20 ENCOUNTER — Other Ambulatory Visit: Payer: Self-pay

## 2021-11-23 ENCOUNTER — Other Ambulatory Visit: Payer: Self-pay | Admitting: Pulmonary Disease

## 2021-11-23 NOTE — Telephone Encounter (Signed)
Dr. Lamonte Sakai signed Continuation of Disability form, I faxed to [facility] fax# [phone], included office notes from 11/11/21.  Mailed hard copy of signed form to patient.

## 2021-11-26 ENCOUNTER — Telehealth: Payer: Self-pay | Admitting: Emergency Medicine

## 2021-11-26 ENCOUNTER — Other Ambulatory Visit: Payer: Self-pay | Admitting: Emergency Medicine

## 2021-11-26 DIAGNOSIS — J432 Centrilobular emphysema: Secondary | ICD-10-CM

## 2021-11-26 DIAGNOSIS — J3089 Other allergic rhinitis: Secondary | ICD-10-CM

## 2021-11-26 MED ORDER — LORATADINE 10 MG PO TABS: 10.0000 mg | ORAL_TABLET | Freq: Every day | ORAL | 1 refills | Status: DC

## 2021-11-26 MED ORDER — ALBUTEROL SULFATE HFA 108 (90 BASE) MCG/ACT IN AERS: 1.0000 | INHALATION_SPRAY | Freq: Four times a day (QID) | RESPIRATORY_TRACT | 3 refills | Status: DC | PRN

## 2021-11-26 MED ORDER — STIOLTO RESPIMAT 2.5-2.5 MCG/ACT IN AERS: 2.0000 | INHALATION_SPRAY | Freq: Every day | RESPIRATORY_TRACT | 3 refills | Status: DC

## 2021-11-26 NOTE — Telephone Encounter (Signed)
I called the patient and verified that she was needing a refill of the Stiolto to [facility] to [facility]. She voices understanding. She reports that she did not need a follow up in 1 month and she wants to wait for a follow up. Nothing further needed.

## 2021-11-26 NOTE — Telephone Encounter (Signed)
Called and spoke with patient. She stated that she was able to get her Stiolto as 90 day supply. She received a letter from [facility] stating that all of her medications needed to be converted to 90 day supply and [facility]. She wanted to go ahead and get the loratadine and albuterol over. I advised her that I would go ahead and send these in.   Nothing further needed at time of call.

## 2021-12-11 ENCOUNTER — Telehealth: Payer: Self-pay

## 2021-12-11 NOTE — Telephone Encounter (Signed)
See encounter from 10/17. Will close this encounter.

## 2021-12-11 NOTE — Telephone Encounter (Signed)
This nurse spoke with this patient who stated that she is supposed to be seeing the provider in December but no one has called her to schedule an appointment.  This nurse advised patient that the schedulers spoke with her on 10/19 and set an appointment for 12/6 at 930 am.  The patient stated there was nothing showing on My Chart.  Patient is in agreement with date and time for appointment.  No other questions or concerns noted at this time.

## 2021-12-25 NOTE — Telephone Encounter (Signed)
See encounter from 10/17. Will close encounter.

## 2021-12-30 ENCOUNTER — Ambulatory Visit: Payer: Commercial Managed Care - PPO | Admitting: Emergency Medicine

## 2022-01-04 ENCOUNTER — Ambulatory Visit (HOSPITAL_COMMUNITY)
Admission: RE | Admit: 2022-01-04 | Discharge: 2022-01-04 | Disposition: A | Discharge status: Home / Self Care | Payer: Commercial Managed Care - PPO | Source: Ambulatory Visit | Attending: Internal Medicine | Admitting: Internal Medicine

## 2022-01-04 DIAGNOSIS — C3490 Malignant neoplasm of unspecified part of unspecified bronchus or lung: Secondary | ICD-10-CM | POA: Diagnosis present

## 2022-01-04 MED ORDER — IOHEXOL 300 MG/ML  SOLN
75.0000 mL | Freq: Once | INTRAMUSCULAR | Status: AC | PRN
  Administered 2022-01-04: 75 mL via INTRAVENOUS

## 2022-01-06 ENCOUNTER — Inpatient Hospital Stay: Payer: Commercial Managed Care - PPO | Attending: Internal Medicine | Admitting: Internal Medicine

## 2022-01-06 ENCOUNTER — Other Ambulatory Visit: Payer: Self-pay

## 2022-01-06 VITALS — BP 131/73 | HR 98 | Temp 98.3°F | Resp 16 | Wt 111.3 lb

## 2022-01-06 DIAGNOSIS — Z08 Encounter for follow-up examination after completed treatment for malignant neoplasm: Secondary | ICD-10-CM | POA: Insufficient documentation

## 2022-01-06 DIAGNOSIS — Z85118 Personal history of other malignant neoplasm of bronchus and lung: Secondary | ICD-10-CM | POA: Diagnosis present

## 2022-01-06 DIAGNOSIS — C3490 Malignant neoplasm of unspecified part of unspecified bronchus or lung: Secondary | ICD-10-CM

## 2022-01-06 NOTE — Progress Notes (Signed)
[place] [phone]   [phone]  OFFICE PROGRESS NOTE  [name], NP [place] [id number]  DIAGNOSIS: Recurrent locally advanced non-small cell lung cancer initially diagnosed as stage IIIB  (T1b, N3, M0) non-small cell lung cancer, adenocarcinoma she presented with right upper lobe nodule in addition to bulky right hilar, mediastinal, and left supraclavicular lymphadenopathy diagnosed in November 2022.  The patient had evidence for disease recurrence in the mediastinal and right supraclavicular lymphadenopathy in August 2023.  DETECTED ALTERATION(S) / BIOMARKER(S) % CFDNA OR AMPLIFICATION ASSOCIATED FDA-APPROVED THERAPIES CLINICAL TRIAL AVAILABILITY [id number] ND 0.5 5 50 100 4.7%  [id number] ND 0.5 5 50 100 1.8%  [id number] ND 0.5 5 50 100 1.9%  BIOMARKER ADDITIONAL DETAILS Tumor Mutational Burden (TMB) 19.02 mut/Mb MSI Status Stable (MSS) PD-L1 Tumor Proportion Score (TPS)* <1%   PRIOR THERAPY:  1) Concurrent chemoradiation with carboplatin for an AUC of 2 and paclitaxel 45 mg per metered squared.  First dose on 01/05/2021.  Status post 7 cycles of treatment.  Last dose was given February 16, 2021. 2) Consolidation immunotherapy with Imfinzi 1500 Mg IV every 4 weeks.  First dose March 25, 2021.  Status post 3 cycles.  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 3) Palliative radiotherapy to the enlarging right supraclavicular lymphadenopathy under the care of Dr. Sondra Come expected to be completed on October 28, 2021.   CURRENT THERAPY: Observation  INTERVAL HISTORY: Julie Nicholson 56 y.o. female returns to the clinic today for follow-up visit.  Her daughter [name] was available by phone during the visit.  The patient is feeling fine today with no concerning complaints except for cough but no significant chest pain, shortness of breath or hemoptysis.  She has mild  pain on the left side of the neck.  She denied having any current nausea, vomiting, diarrhea or constipation.  She has no headache or visual changes.  She has no recent weight loss or night sweats.  She tolerated her previous palliative radiotherapy to the right supraclavicular lymphadenopathy fairly well.  She is here today for evaluation with repeat CT scan of the neck and chest for restaging of her disease.  MEDICAL HISTORY: Past Medical History:  Diagnosis Date   Abdominal discomfort    Cancer ([place])    Chronic headaches    due to allergies, sinus   COPD (chronic obstructive pulmonary disease) ([place])    per 2012 chest xray   pt states she doesn not have this now (04/10/2013)   Deviated nasal septum    Eustachian tube dysfunction    GERD (gastroesophageal reflux disease)    occasional uses Tums / Rolaids   Hearing loss    right ear   High cholesterol    History of radiation therapy    right lung 01/07/2021-02/19/2021  Dr Gery Pray   Migraine    "only once in a blue moon since RX'd allergy shots" (04/10/2013)   Pancreatitis 02/08/2013   Pneumonia    Rhinitis, allergic     ALLERGIES:  is allergic to benadryl [diphenhydramine].  MEDICATIONS:  Current Outpatient Medications  Medication Sig Dispense Refill   acetaminophen (TYLENOL) 500 MG tablet Take 1,000 mg by mouth every 6 (six) hours as needed for moderate pain.     albuterol (PROVENTIL) (2.5 MG/3ML) 0.083% nebulizer solution albuterol sulfate 2.5 mg/3 mL (0.083 %) solution for nebulization  USE 1 VIAL IN NEBULIZER EVERY 6 HOURS AS NEEDED FOR WHEEZING OR  SHORTNESS OF BREATH     albuterol (VENTOLIN HFA) 108 (90 Base) MCG/ACT inhaler Inhale 1-2 puffs into the lungs every 6 (six) hours as needed for wheezing or shortness of breath. 54 g 3   amoxicillin-clavulanate (AUGMENTIN) 875-125 MG tablet Take 1 tablet by mouth 2 (two) times daily. 14 tablet 0   aspirin EC 81 MG tablet Take 81 mg by mouth at bedtime.     atorvastatin (LIPITOR)  40 MG tablet Take 40 mg by mouth at bedtime.     benzonatate (TESSALON) 200 MG capsule Take 200 mg by mouth 3 (three) times daily as needed.     Biotin 5000 MCG TABS Take 5,000 mcg by mouth at bedtime.     buPROPion (WELLBUTRIN SR) 150 MG 12 hr tablet Take 1 tablet (150 mg total) by mouth 2 (two) times daily. 180 tablet 0   Ca Carbonate-Mag Hydroxide (ROLAIDS PO) Take 1 tablet by mouth daily as needed (heartburn).     fluticasone (FLONASE) 50 MCG/ACT nasal spray Place 2 sprays into both nostrils daily as needed for allergies.     loratadine (CLARITIN) 10 MG tablet Take 1 tablet (10 mg total) by mouth daily. 90 tablet 1   Multiple Vitamins-Minerals (HAIR SKIN AND NAILS FORMULA PO) Take 1 tablet by mouth daily.     ondansetron (ZOFRAN-ODT) 8 MG disintegrating tablet Take 8 mg by mouth 3 (three) times daily as needed.     Oxycodone HCl 10 MG TABS Take 1 tablet by mouth 3 (three) times daily as needed (pain).     sucralfate (CARAFATE) 1 g tablet Take 1 tablet (1 g total) by mouth 4 (four) times daily -  with meals and at bedtime. Crush and dissolve in 10 mL of warm water prior to swallowing 120 tablet 1   Tiotropium Bromide-Olodaterol (STIOLTO RESPIMAT) 2.5-2.5 MCG/ACT AERS Inhale 2 puffs into the lungs daily. 4 g 3   UBRELVY 100 MG TABS Take 100 mg by mouth daily as needed (migraine).     No current facility-administered medications for this visit.    SURGICAL HISTORY:  Past Surgical History:  Procedure Laterality Date   ABDOMINAL HYSTERECTOMY  1995   tx endometriosis, both ovaries removed   APPENDECTOMY  late 1990's   BIOPSY  02/26/2021   Procedure: BIOPSY;  Surgeon: Milus Banister, MD;  Location: [facility];  Service: Endoscopy;;   BRONCHIAL BRUSHINGS  12/15/2020   Procedure: BRONCHIAL BRUSHINGS;  Surgeon: Collene Gobble, MD;  Location: [facility];  Service: Cardiopulmonary;;   BRONCHIAL NEEDLE ASPIRATION BIOPSY  12/15/2020   Procedure: BRONCHIAL NEEDLE ASPIRATION BIOPSIES;  Surgeon:  Collene Gobble, MD;  Location: [facility];  Service: Cardiopulmonary;;   CHOLECYSTECTOMY  04/10/2013   CHOLECYSTECTOMY N/A 04/10/2013   Procedure: LAPAROSCOPIC CHOLECYSTECTOMY WITH INTRAOPERATIVE CHOLANGIOGRAM;  Surgeon: Odis Hollingshead, MD;  Location: [facility];  Service: General;  Laterality: N/A;   ELECTROMAGNETIC NAVIGATION BROCHOSCOPY  12/15/2020   Procedure: ELECTROMAGNETIC NAVIGATION BRONCHOSCOPY;  Surgeon: Collene Gobble, MD;  Location: [facility];  Service: Cardiopulmonary;;   ESOPHAGOGASTRODUODENOSCOPY (EGD) WITH PROPOFOL N/A 02/26/2021   Procedure: ESOPHAGOGASTRODUODENOSCOPY (EGD) WITH PROPOFOL;  Surgeon: Milus Banister, MD;  Location: [facility];  Service: Endoscopy;  Laterality: N/A;   EUS N/A 02/16/2013   Procedure: UPPER ENDOSCOPIC ULTRASOUND (EUS) LINEAR;  Surgeon: Milus Banister, MD;  Location: [facility];  Service: Endoscopy;  Laterality: N/A;  radial linear   KNEE ARTHROSCOPY Right 1980's   "cartilage OR"   LAPAROSCOPIC ENDOMETRIOSIS FULGURATION  1980's  MYRINGOTOMY WITH TUBE PLACEMENT Right 07/13/2018   Procedure: MYRINGOTOMY WITH TUBE PLACEMENT;  Surgeon: Margaretha Sheffield, MD;  Location: [facility];  Service: ENT;  Laterality: Right;   MYRINGOTOMY WITH TUBE PLACEMENT Right 06/04/2021   Procedure: MYRINGOTOMY WITH BUTTERFLY TUBE PLACEMENT;  Surgeon: Margaretha Sheffield, MD;  Location: [facility];  Service: ENT;  Laterality: Right;   NASOPHARYNGOSCOPY EUSTATION TUBE BALLOON DILATION Right 07/13/2018   Procedure: NASOPHARYNGOSCOPY EUSTATION TUBE BALLOON DILATION;  Surgeon: Margaretha Sheffield, MD;  Location: [facility];  Service: ENT;  Laterality: Right;   TONSILLECTOMY AND ADENOIDECTOMY  ~ 1980   adenoidectomy   TUBAL LIGATION  ~ [place] Right 07/13/2018   Procedure: OUTFRACTURE TURBINATE;  Surgeon: Margaretha Sheffield, MD;  Location: [facility];  Service: ENT;  Laterality: Right;   VIDEO BRONCHOSCOPY WITH ENDOBRONCHIAL ULTRASOUND  N/A 12/15/2020   Procedure: ROBOTIC VIDEO BRONCHOSCOPY WITH ENDOBRONCHIAL ULTRASOUND;  Surgeon: Collene Gobble, MD;  Location: [facility];  Service: Cardiopulmonary;  Laterality: N/A;   WRIST SURGERY Left    w/plate    REVIEW OF SYSTEMS:  Constitutional: positive for fatigue Eyes: negative Ears, nose, mouth, throat, and face: negative Respiratory: positive for cough Cardiovascular: negative Gastrointestinal: negative Genitourinary:negative Integument/breast: negative Hematologic/lymphatic: negative Musculoskeletal:negative Neurological: negative Behavioral/Psych: positive for anxiety Endocrine: negative Allergic/Immunologic: negative   PHYSICAL EXAMINATION: General appearance: alert, cooperative, and no distress Head: Normocephalic, without obvious abnormality, atraumatic Neck: moderate anterior cervical adenopathy, no JVD, supple, symmetrical, trachea midline, and thyroid not enlarged, symmetric, no tenderness/mass/nodules Lymph nodes: Cervical, supraclavicular, and axillary nodes normal. Resp: clear to auscultation bilaterally Back: symmetric, no curvature. ROM normal. No CVA tenderness. Cardio: regular rate and rhythm, S1, S2 normal, no murmur, click, rub or gallop GI: soft, non-tender; bowel sounds normal; no masses,  no organomegaly Extremities: extremities normal, atraumatic, no cyanosis or edema Neurologic: Alert and oriented X 3, normal strength and tone. Normal symmetric reflexes. Normal coordination and gait  ECOG PERFORMANCE STATUS: 1 - Symptomatic but completely ambulatory  Blood pressure 131/73, pulse 98, temperature 98.3 F (36.8 C), temperature source Oral, resp. rate 16, weight 111 lb 4.8 oz (50.5 kg), SpO2 100 %.  LABORATORY DATA: Lab Results  Component Value Date   WBC 10.8 (H) 09/07/2021   HGB 14.5 09/07/2021   HCT 41.5 09/07/2021   MCV 94.7 09/07/2021   PLT 244 09/07/2021      Chemistry      Component Value Date/Time   NA 141 09/07/2021 1058    K 3.2 (L) 09/07/2021 1058   CL 104 09/07/2021 1058   CO2 30 09/07/2021 1058   BUN 14 09/07/2021 1058   CREATININE 0.80 09/07/2021 1058      Component Value Date/Time   CALCIUM 9.8 09/07/2021 1058   ALKPHOS 119 09/07/2021 1058   AST 14 (L) 09/07/2021 1058   ALT 17 09/07/2021 1058   BILITOT 0.6 09/07/2021 1058       RADIOGRAPHIC STUDIES: CT Soft Tissue Neck W Contrast  Result Date: 01/05/2022 CLINICAL DATA:  Neck mass, nonpulsatile. Non-small cell lung cancer. Staging. EXAM: CT NECK WITH CONTRAST TECHNIQUE: Multidetector CT imaging of the neck was performed using the standard protocol following the bolus administration of intravenous contrast. RADIATION DOSE REDUCTION: This exam was performed according to the departmental dose-optimization program which includes automated exposure control, adjustment of the mA and/or kV according to patient size and/or use of iterative reconstruction technique. CONTRAST:  16m OMNIPAQUE IOHEXOL 300 MG/ML  SOLN COMPARISON:  CT chest same day.  PET scan 09/16/2021. CT chest 09/07/2021. FINDINGS: Pharynx and larynx: No mucosal or submucosal lesion. Salivary glands: Parotid and submandibular glands are normal. Thyroid: Normal. Lymph nodes: No upper cervical lymphadenopathy or soft tissue mass. On the left, there is a level 4 lymph node axial image 77 measuring 7 x 8.5 mm. Immediately below that, there is a second node measuring 10 x 9 mm. Axial image 83 shows a supraclavicular node measuring 12 x 9 mm. All of these are larger than on prior imaging studies. On the right, there is reduction in size of a previous large nodal mass at the low level 4 station just to the right of the thyroid gland. Residual nodal tissue in this location now measures 7 x 12 mm. There is also reduction in size of a lymph node just to the right of the trachea and esophagus axial image 85, measuring 9 x 11 mm. No progressive adenopathy seen in the right side of the neck. Vascular: Carotid  bifurcation atherosclerosis without flow limiting stenosis. Venous structures are patent. Limited intracranial: Normal Visualized orbits: Normal Mastoids and visualized paranasal sinuses: Clear Skeleton: Ordinary spondylosis. No evidence of metastatic disease to bone. Upper chest: See results of chest CT today. Other: None IMPRESSION: 1. Enlarging lymph nodes in the left level 4 and left supraclavicular regions, the largest measuring 12 x 9 mm. 2. Reduction in size of a previous large nodal mass at the right level 4 station, now measuring 7 x 12 mm. 3. Reduction in size of a lymph node just to the right of the trachea and esophagus at the thoracic inlet, now measuring 9 x 11 mm. 4. No progressive adenopathy in the right side of the neck. 5. See results of chest CT today. Electronically Signed   By: Nelson Chimes M.D.   On: 01/05/2022 14:05   CT Chest W Contrast  Result Date: 01/05/2022 CLINICAL DATA:  Non-small-cell lung cancer. Restaging. * Tracking Code: BO * . EXAM: CT CHEST WITH CONTRAST TECHNIQUE: Multidetector CT imaging of the chest was performed during intravenous contrast administration. RADIATION DOSE REDUCTION: This exam was performed according to the departmental dose-optimization program which includes automated exposure control, adjustment of the mA and/or kV according to patient size and/or use of iterative reconstruction technique. CONTRAST:  67m OMNIPAQUE IOHEXOL 300 MG/ML  SOLN COMPARISON:  PET-CT 09/16/2021.  Chest CT 09/07/2021. FINDINGS: Cardiovascular: The heart size is normal. No substantial pericardial effusion. No thoracic aortic aneurysm. No substantial atherosclerosis of the thoracic aorta. Mediastinum/Nodes: Index right paratracheal node measured previously at 1.4 cm short axis is now 0.8 cm on image 19/series 2 high right paratracheal/paraesophageal node measured previously at 2.0 cm short axis is now 0.7 cm short axis on image 8/series 3. There is no hilar lymphadenopathy. Mild  circumferential wall thickening noted mid esophagus. There is no axillary lymphadenopathy. Lungs/Pleura: Biapical pleuroparenchymal scarring again noted. Centrilobular and paraseptal emphysema evident. Involving radiation fibrosis again noted in the paramediastinal and parahilar right lung with imaging features similar to prior. No new suspicious pulmonary nodule or mass. No focal airspace consolidation. There is no evidence of pleural effusion. Upper Abdomen: Unremarkable. Musculoskeletal: No worrisome lytic or sclerotic osseous abnormality. IMPRESSION: 1. Interval decrease in size of the right paratracheal and high right paratracheal/paraesophageal lymph nodes. No new or progressive findings on today's exam. 2. 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 3. Mild circumferential wall thickening mid esophagus. While this may be treatment related, esophagitis could also have this appearance. 4.  Emphysema (ICD10-J43.9). Electronically Signed   By: Misty Stanley M.D.   On: 01/05/2022 09:35    ASSESSMENT AND PLAN: This is a very pleasant 56 years old white female with stage IIIB (T1b, N3, M0) non-small cell lung cancer, adenocarcinoma diagnosed in November 2022 with no actionable mutation and negative PD-L1 expression. The patient completed a course of concurrent chemoradiation with weekly carboplatin for AUC of 2 and paclitaxel 45 Mg/M2 status post 7 cycles.  She has been tolerating her treatment well except for the mild odynophagia and skin burns. She was also recently admitted to the hospital complaining of dysphagia and odynophagia secondary to radiation induced esophagitis.  She is feeling much better but continues to have residual dysphagia.  She is followed by gastroenterology and was seen by Dr. Ardis Hughs during her hospitalization. Her scan showed improvement of her disease. I recommended for the patient treatment with consolidation immunotherapy with Imfinzi 1500 Mg IV  every 4 weeks.  Status post 3 cycles.  Last dose was given in April 2023.  Her treatment was discontinued secondary to suspicious immunotherapy mediated pneumonitis with significant shortness of breath at that time and she was treated with a tapered dose of prednisone. Unfortunately her scan showed evidence for disease recurrence with enlargement of lower right cervical lymph nodes as well as mediastinal lymphadenopathy.  She has palpable right cervical lymphadenopathy. 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 There was no evidence for distant metastatic disease. The patient underwent ultrasound-guided core biopsy of the right supraclavicular lymph nodes but the final pathology showed necrotic tumor cells with complete coagulative necrosis with focal fibrous tissue and histiocytic reaction.  She also had MRI of the brain that showed no evidence of metastatic disease to the brain. The patient was seen by Dr. Sondra Come and started palliative radiotherapy to the enlarging right supraclavicular lymphadenopathy.  This was completed on October 28, 2021. She has been on observation since that time and she is feeling fine with no concerning adverse effects. The patient had repeat CT of the neck and chest performed recently.  I personally and independently reviewed the scan images and discussed the results with the patient today. Her scan showed no concerning findings for disease progression except for a slightly enlarging left cervical lymphadenopathy. I discussed with the patient several options for management of her condition include continuous observation and  close monitoring with repeat imaging studies in 3 months versus referral back to Dr. Sondra Come for consideration of palliative radiotherapy to this area. The patient and her daughter would like to continue on observation for now but she will call me sooner if she notices any significant enlargement of these lymph nodes. She was advised to call immediately if she has any other concerning symptoms in the interval. The patient voices understanding of current disease status and treatment options and is in agreement with the current care plan.  All questions were answered. The patient knows to call the clinic with any problems, questions or concerns. We can certainly see the patient much sooner if necessary.  The total time spent in the appointment was 30 minutes.  Disclaimer: This note was dictated with voice recognition software. Similar sounding words can inadvertently be transcribed and may not be corrected  upon review.

## 2022-01-14 ENCOUNTER — Other Ambulatory Visit: Payer: Self-pay

## 2022-02-07 ENCOUNTER — Other Ambulatory Visit: Payer: Self-pay

## 2022-02-14 ENCOUNTER — Other Ambulatory Visit: Payer: Self-pay | Admitting: Emergency Medicine

## 2022-03-10 ENCOUNTER — Other Ambulatory Visit: Payer: Self-pay | Admitting: Medical Oncology

## 2022-03-10 ENCOUNTER — Telehealth: Payer: Self-pay | Admitting: Medical Oncology

## 2022-03-10 NOTE — Telephone Encounter (Signed)
New Pain-started today in  R side of neck under jaw . It hurts under r armpit and r breast and around to back. Voice  comes and goes x 3 days. Feels pressure in her r ear.    No neck swelling. No SOB more than usual. CT scan expected in early March . I instructed her to seek emergency care for SOB, dyspnea , worsening pain.  Will ask Julien Nordmann to advise.

## 2022-03-11 ENCOUNTER — Encounter: Payer: Self-pay | Admitting: Internal Medicine

## 2022-03-11 ENCOUNTER — Other Ambulatory Visit: Payer: Self-pay

## 2022-03-11 ENCOUNTER — Telehealth: Payer: Self-pay | Admitting: *Deleted

## 2022-03-11 DIAGNOSIS — C3490 Malignant neoplasm of unspecified part of unspecified bronchus or lung: Secondary | ICD-10-CM

## 2022-03-11 NOTE — Telephone Encounter (Signed)
Julie Nicholson states she is now hurting on the left side of her neck and it is swollen. Message to [facility] to see if they can evaluate tomorrow. Pt is not available today as her mother is getting chemo today at [facility].

## 2022-03-11 NOTE — Progress Notes (Signed)
Lab orders entered for [facility] visit.

## 2022-03-12 ENCOUNTER — Inpatient Hospital Stay: Payer: Commercial Managed Care - PPO | Attending: Internal Medicine | Admitting: Physician Assistant

## 2022-03-12 ENCOUNTER — Other Ambulatory Visit: Payer: Self-pay

## 2022-03-12 ENCOUNTER — Inpatient Hospital Stay: Payer: Commercial Managed Care - PPO

## 2022-03-12 VITALS — BP 109/72 | HR 109 | Temp 98.3°F | Resp 18 | Wt 112.2 lb

## 2022-03-12 DIAGNOSIS — C3490 Malignant neoplasm of unspecified part of unspecified bronchus or lung: Secondary | ICD-10-CM | POA: Diagnosis not present

## 2022-03-12 DIAGNOSIS — R590 Localized enlarged lymph nodes: Secondary | ICD-10-CM | POA: Insufficient documentation

## 2022-03-12 DIAGNOSIS — F439 Reaction to severe stress, unspecified: Secondary | ICD-10-CM

## 2022-03-12 DIAGNOSIS — H9201 Otalgia, right ear: Secondary | ICD-10-CM | POA: Diagnosis present

## 2022-03-12 DIAGNOSIS — M47812 Spondylosis without myelopathy or radiculopathy, cervical region: Secondary | ICD-10-CM | POA: Insufficient documentation

## 2022-03-12 DIAGNOSIS — C787 Secondary malignant neoplasm of liver and intrahepatic bile duct: Secondary | ICD-10-CM | POA: Insufficient documentation

## 2022-03-12 DIAGNOSIS — C770 Secondary and unspecified malignant neoplasm of lymph nodes of head, face and neck: Secondary | ICD-10-CM | POA: Insufficient documentation

## 2022-03-12 DIAGNOSIS — C3411 Malignant neoplasm of upper lobe, right bronchus or lung: Secondary | ICD-10-CM | POA: Insufficient documentation

## 2022-03-12 DIAGNOSIS — B001 Herpesviral vesicular dermatitis: Secondary | ICD-10-CM | POA: Insufficient documentation

## 2022-03-12 LAB — CBC WITH DIFFERENTIAL (CANCER CENTER ONLY)
Abs Immature Granulocytes: 0.01 10*3/uL (ref 0.00–0.07)
Basophils Absolute: 0 10*3/uL (ref 0.0–0.1)
Basophils Relative: 0 %
Eosinophils Absolute: 0 10*3/uL (ref 0.0–0.5)
Eosinophils Relative: 1 %
HCT: 40.6 % (ref 36.0–46.0)
Hemoglobin: 14.4 g/dL (ref 12.0–15.0)
Immature Granulocytes: 0 %
Lymphocytes Relative: 12 %
Lymphs Abs: 0.8 10*3/uL (ref 0.7–4.0)
MCH: 33 pg (ref 26.0–34.0)
MCHC: 35.5 g/dL (ref 30.0–36.0)
MCV: 93.1 fL (ref 80.0–100.0)
Monocytes Absolute: 0.7 10*3/uL (ref 0.1–1.0)
Monocytes Relative: 11 %
Neutro Abs: 5.2 10*3/uL (ref 1.7–7.7)
Neutrophils Relative %: 76 %
Platelet Count: 240 10*3/uL (ref 150–400)
RBC: 4.36 MIL/uL (ref 3.87–5.11)
RDW: 12 % (ref 11.5–15.5)
WBC Count: 6.8 10*3/uL (ref 4.0–10.5)
nRBC: 0 % (ref 0.0–0.2)

## 2022-03-12 LAB — CMP (CANCER CENTER ONLY)
ALT: 50 U/L — ABNORMAL HIGH (ref 0–44)
AST: 55 U/L — ABNORMAL HIGH (ref 15–41)
Albumin: 4.1 g/dL (ref 3.5–5.0)
Alkaline Phosphatase: 126 U/L (ref 38–126)
Anion gap: 10 (ref 5–15)
BUN: 12 mg/dL (ref 6–20)
CO2: 25 mmol/L (ref 22–32)
Calcium: 10.1 mg/dL (ref 8.9–10.3)
Chloride: 104 mmol/L (ref 98–111)
Creatinine: 0.74 mg/dL (ref 0.44–1.00)
GFR, Estimated: >60 mL/min (ref >60)
Glucose, Bld: 105 mg/dL — ABNORMAL HIGH (ref 70–99)
Potassium: 4.1 mmol/L (ref 3.5–5.1)
Sodium: 139 mmol/L (ref 135–145)
Total Bilirubin: 1.3 mg/dL — ABNORMAL HIGH (ref 0.3–1.2)
Total Protein: 7.8 g/dL (ref 6.5–8.1)

## 2022-03-12 MED ORDER — VALACYCLOVIR HCL 1 G PO TABS: 1000.0000 mg | ORAL_TABLET | Freq: Two times a day (BID) | ORAL | 0 refills | Status: AC

## 2022-03-12 NOTE — Progress Notes (Signed)
Symptom Management Consult note [place]    Patient Care Team: [name], NP as PCP - General    Name / MRN / DOB: Julie Nicholson  [id number]  1965-07-10   Date of visit: 03/12/2022   Chief Complaint/Reason for visit: neck pain, right ear pain   Current Therapy: Observation  Last treatment:  Palliative RT fir right supraclavicular lymphadenopathy completed in 10/2021. 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   ASSESSMENT & PLAN: Patient is a 57 y.o. female  with oncologic history of recurrent locally advanced non-small cell lung cancer initially diagnosed as stage IIIB  (T1b, N3, M0) non-small cell lung cancer, adenocarcinoma. Recurrence in mediastinal and right supraclavicular lymphadenopathy followed by Dr. Julien Nordmann.  I have viewed most recent oncology note and lab work.     #Neck pain and right ear pain -Exam without symptoms to suggest infection, antibiotics not needed.  She has fullness on left lateral aspect of neck without defined palpable mass. Airway intact, normal work of breathing. -CBC and CMP overall unremarkable. LFTs mildly elevated. Can be trended by onc or pcp at upcoming appointments.  -She has pain medicine and tessalon perles already. No symptom management needed at this time after lengthy discussion. Patient was schedule to have repeat imaging of CT chest and CT soft tissue neck on 04/05/22. As she is symptomatic and there is concern for recurrence we will move up her scans to ASAP. RN will call to schedule them.  #Cold sore -Exam with sore on lower lip. Has history of cold sores in the past. Not improved with OTC ointment. 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  #Stress at home  -Self reported history of depression and anxiety. Has multiple stressors in her home life. She feels safe at home and denies suicidal/homicidal ideations. -She was prescribed  Wellbutrin by pulmonologist Dr. Lamonte Sakai in 2022 to aid with tobacco cessation. She has not smoked in over 1 year and continues to take Wellbutrin. -She has been taking half dose for the last year and noticed when she was on the full dose it helped her depression to be manageable. -I recommended patient increase to full dose again and follow up with pcp to have her take over medication management. Patient is scheduled to see pcp this month already.  #Recurrent locally advanced non-small cell lung cancer initially diagnosed as stage IIIB  (T1b, N3, M0) non-small cell lung cancer, adenocarcinoma. Recurrence in mediastinal and right supraclavicular lymphadenopathy -Last saw oncology 01/2022 and CT neck at that time revealed no concerning findings for disease progression except for a slightly enlarging left cervical lymphadenopathy. Patient was offered radiation although chose close observation. - Next appointment with oncologist is 04/07/22, will be moved up if scan shows concerning results.  Strict ED precautions discussed should symptoms worsen.    Heme/Onc History: Oncology History  Primary adenocarcinoma of upper lobe of right lung ([place])  12/18/2020 Initial Diagnosis   Primary adenocarcinoma of upper lobe of right lung ([place])   01/05/2021 - 02/16/2021 Chemotherapy   Patient is on Treatment Plan : LUNG Carboplatin / Paclitaxel + XRT q7d     02/09/2021 Cancer Staging   Staging form: Lung, AJCC 8th Edition - Clinical: Stage IIIB (cT1b, cN3, cM0) - Signed by Curt Bears, MD on 02/09/2021   03/25/2021 -  Chemotherapy   Patient is on Treatment Plan : LUNG NSCLC Durvalumab q28d  Interval history-: Julie Nicholson is a 57 y.o. female with oncologic history as above presenting to [facility] today with chief complaint of neck pain and right ear pain x 3 days. She is accompanied by her daughter who provides additional history.  Patient states she has history of ear problems.  She has had tubes in  both of her ears.  Most recently she had tube in her right ear that was removed several months ago by ENT after it started to fall out. She is reporting aching and pressure feeling in her right ear radiating to the right side of her neck.  She rates the pain 5 out of 10 in severity.  Patient has oxycodone as prescribed from her PCP for pain management which she has been taking and thinks it is helping. She has nonproductive cough that has been present since her treatment that is unchanged today. She always has sinus pressure although denies any rhinorrhea, congestion, or sore throat.  Patient denies any fall or injury.  Yesterday she reported the left side of her neck started hurting.  She describes it as a tenderness.  Pain does not radiate and is worse with palpation.  She noticed some swelling on the left side of her neck as well.  She is tolerating p.o. intake without any difficulty, denies any difficulty breathing or swallowing.  She noticed that she has some right armpit tenderness x 3 days ago as well.  She denies any injury or heavy lifting.  Patient also endorses having night sweats for the last 2 weeks which is new for her.  She also reports having a fever blister on her bottom lip.  It popped up approximately 2 days ago.  She has a history of these, has not had one in a while.  She states it looked like an ulcer when it first appeared and then popped yesterday there was clear drainage. She has been trying OTC ointments without relief.  She reports associated aching pain. Daughter states she noticed patient has been more anxious lately. Patients is currently taking Wellbutrin that has been prescribed by pulmonologist. She is only taking half the dose because she thought it would be enough. Daughter adds when patient was taking the full dose in the past she seemed to be managing her depression better. Patient agrees with daughter. She admits to feeling safe and home and denies suicidal and homicidal  ideations. Denies weight loss.   ROS  All other systems are reviewed and are negative for acute change except as noted in the HPI.    Allergies  Allergen Reactions   Benadryl [Diphenhydramine] Other (See Comments)    Oversedation - 50 mg IV given 01/05/21; switched to IV Certirizine.     Past Medical History:  Diagnosis Date   Abdominal discomfort    Cancer ([place])    Chronic headaches    due to allergies, sinus   COPD (chronic obstructive pulmonary disease) ([place])    per 2012 chest xray   pt states she doesn not have this now (04/10/2013)   Deviated nasal septum    Eustachian tube dysfunction    GERD (gastroesophageal reflux disease)    occasional uses Tums / Rolaids   Hearing loss    right ear   High cholesterol    History of radiation therapy    right lung 01/07/2021-02/19/2021  Dr Gery Pray   Migraine    "only once in a blue moon since RX'd allergy shots" (04/10/2013)   Pancreatitis 02/08/2013  Pneumonia    Rhinitis, allergic      Past Surgical History:  Procedure Laterality Date   ABDOMINAL HYSTERECTOMY  1995   tx endometriosis, both ovaries removed   APPENDECTOMY  late 1990's   BIOPSY  02/26/2021   Procedure: BIOPSY;  Surgeon: Milus Banister, MD;  Location: [facility];  Service: Endoscopy;;   BRONCHIAL BRUSHINGS  12/15/2020   Procedure: BRONCHIAL BRUSHINGS;  Surgeon: Collene Gobble, MD;  Location: [place];  Service: Cardiopulmonary;;   BRONCHIAL NEEDLE ASPIRATION BIOPSY  12/15/2020   Procedure: BRONCHIAL NEEDLE ASPIRATION BIOPSIES;  Surgeon: Collene Gobble, MD;  Location: [place] [facility];  Service: Cardiopulmonary;;   CHOLECYSTECTOMY  04/10/2013   CHOLECYSTECTOMY N/A 04/10/2013   Procedure: LAPAROSCOPIC CHOLECYSTECTOMY WITH INTRAOPERATIVE CHOLANGIOGRAM;  Surgeon: Odis Hollingshead, MD;  Location: [place];  Service: General;  Laterality: N/A;   ELECTROMAGNETIC NAVIGATION BROCHOSCOPY  12/15/2020   Procedure: ELECTROMAGNETIC NAVIGATION BRONCHOSCOPY;   Surgeon: Collene Gobble, MD;  Location: [facility];  Service: Cardiopulmonary;;   ESOPHAGOGASTRODUODENOSCOPY (EGD) WITH PROPOFOL N/A 02/26/2021   Procedure: ESOPHAGOGASTRODUODENOSCOPY (EGD) WITH PROPOFOL;  Surgeon: Milus Banister, MD;  Location: [facility];  Service: Endoscopy;  Laterality: N/A;   EUS N/A 02/16/2013   Procedure: UPPER ENDOSCOPIC ULTRASOUND (EUS) LINEAR;  Surgeon: Milus Banister, MD;  Location: [facility];  Service: Endoscopy;  Laterality: N/A;  radial linear   KNEE ARTHROSCOPY Right 1980's   "cartilage OR"   LAPAROSCOPIC ENDOMETRIOSIS FULGURATION  1980's   MYRINGOTOMY WITH TUBE PLACEMENT Right 07/13/2018   Procedure: MYRINGOTOMY WITH TUBE PLACEMENT;  Surgeon: Margaretha Sheffield, MD;  Location: [place];  Service: ENT;  Laterality: Right;   MYRINGOTOMY WITH TUBE PLACEMENT Right 06/04/2021   Procedure: MYRINGOTOMY WITH BUTTERFLY TUBE PLACEMENT;  Surgeon: Margaretha Sheffield, MD;  Location: [place];  Service: ENT;  Laterality: Right;   NASOPHARYNGOSCOPY EUSTATION TUBE BALLOON DILATION Right 07/13/2018   Procedure: NASOPHARYNGOSCOPY EUSTATION TUBE BALLOON DILATION;  Surgeon: Margaretha Sheffield, MD;  Location: [place];  Service: ENT;  Laterality: Right;   TONSILLECTOMY AND ADENOIDECTOMY  ~ 1980   adenoidectomy   TUBAL LIGATION  ~ [place] Right 07/13/2018   Procedure: OUTFRACTURE TURBINATE;  Surgeon: Margaretha Sheffield, MD;  Location: [place];  Service: ENT;  Laterality: Right;   VIDEO BRONCHOSCOPY WITH ENDOBRONCHIAL ULTRASOUND N/A 12/15/2020   Procedure: ROBOTIC VIDEO BRONCHOSCOPY WITH ENDOBRONCHIAL ULTRASOUND;  Surgeon: Collene Gobble, MD;  Location: [facility];  Service: Cardiopulmonary;  Laterality: N/A;   WRIST SURGERY Left    w/plate    Social History   Socioeconomic History   Marital status: Married    Spouse name: Not on file   Number of children: Not on file   Years of education: 12   Highest education level:  12th grade  Occupational History   Not on file  Tobacco Use   Smoking status: Former    Packs/day: 0.50    Years: 41.00    Total pack years: 20.50    Types: Cigarettes    Quit date: 12/23/2020    Years since quitting: 1.2   Smokeless tobacco: Never  Vaping Use   Vaping Use: Never used  Substance and Sexual Activity   Alcohol use: Not Currently    Comment: occasional   Drug use: No   Sexual activity: Yes    Birth control/protection: Surgical    Comment: Hysterectomy  Other Topics Concern   Not on file  Social History Narrative   Not on file  Social Determinants of Health   Financial Resource Strain: Not on file  Food Insecurity: Not on file  Transportation Needs: Not on file  Physical Activity: Not on file  Stress: Not on file  Social Connections: Not on file  Intimate Partner Violence: Not on file    Family History  Problem Relation Age of Onset   Diabetes Father    Diabetes Mother    COPD Mother    COPD Maternal Grandfather    COPD Maternal Aunt    COPD Maternal Uncle      Current Outpatient Medications:    valACYclovir (VALTREX) 1000 MG tablet, Take 1 tablet (1,000 mg total) by mouth 2 (two) times daily for 7 days., Disp: 14 tablet, Rfl: 0   acetaminophen (TYLENOL) 500 MG tablet, Take 1,000 mg by mouth every 6 (six) hours as needed for moderate pain., Disp: , Rfl:    albuterol (PROVENTIL) (2.5 MG/3ML) 0.083% nebulizer solution, albuterol sulfate 2.5 mg/3 mL (0.083 %) solution for nebulization  USE 1 VIAL IN NEBULIZER EVERY 6 HOURS AS NEEDED FOR WHEEZING OR SHORTNESS OF BREATH, Disp: , Rfl:    albuterol (VENTOLIN HFA) 108 (90 Base) MCG/ACT inhaler, Inhale 1-2 puffs into the lungs every 6 (six) hours as needed for wheezing or shortness of breath., Disp: 54 g, Rfl: 3   amoxicillin-clavulanate (AUGMENTIN) 875-125 MG tablet, Take 1 tablet by mouth 2 (two) times daily., Disp: 14 tablet, Rfl: 0   aspirin EC 81 MG tablet, Take 81 mg by mouth at bedtime., Disp: , Rfl:     atorvastatin (LIPITOR) 40 MG tablet, Take 40 mg by mouth at bedtime., Disp: , Rfl:    benzonatate (TESSALON) 200 MG capsule, Take 200 mg by mouth 3 (three) times daily as needed., Disp: , Rfl:    Biotin 5000 MCG TABS, Take 5,000 mcg by mouth at bedtime., Disp: , Rfl:    buPROPion (ZYBAN) 150 MG 12 hr tablet, TAKE 1 TABLET BY MOUTH TWICE A DAY, Disp: 180 tablet, Rfl: 0   Ca Carbonate-Mag Hydroxide (ROLAIDS PO), Take 1 tablet by mouth daily as needed (heartburn)., Disp: , Rfl:    fluticasone (FLONASE) 50 MCG/ACT nasal spray, Place 2 sprays into both nostrils daily as needed for allergies., Disp: , Rfl:    loratadine (CLARITIN) 10 MG tablet, Take 1 tablet (10 mg total) by mouth daily., Disp: 90 tablet, Rfl: 1   Multiple Vitamins-Minerals (HAIR SKIN AND NAILS FORMULA PO), Take 1 tablet by mouth daily., Disp: , Rfl:    ondansetron (ZOFRAN-ODT) 8 MG disintegrating tablet, Take 8 mg by mouth 3 (three) times daily as needed., Disp: , Rfl:    Oxycodone HCl 10 MG TABS, Take 1 tablet by mouth 3 (three) times daily as needed (pain)., Disp: , Rfl:    sucralfate (CARAFATE) 1 g tablet, Take 1 tablet (1 g total) by mouth 4 (four) times daily -  with meals and at bedtime. Crush and dissolve in 10 mL of warm water prior to swallowing, Disp: 120 tablet, Rfl: 1   Tiotropium Bromide-Olodaterol (STIOLTO RESPIMAT) 2.5-2.5 MCG/ACT AERS, Inhale 2 puffs into the lungs daily., Disp: 4 g, Rfl: 3   UBRELVY 100 MG TABS, Take 100 mg by mouth daily as needed (migraine)., Disp: , Rfl:   PHYSICAL EXAM: ECOG FS:1 - Symptomatic but completely ambulatory    Vitals:   03/12/22 0946  BP: 109/72  Pulse: (!) 109  Resp: 18  Temp: 98.3 F (36.8 C)  TempSrc: Oral  SpO2: 100%  Weight: 112  lb 3.2 oz (50.9 kg)   Physical Exam Vitals and nursing note reviewed.  Constitutional:      Appearance: She is well-developed. She is not ill-appearing or toxic-appearing.  HENT:     Head: Normocephalic.     Comments: Airway patent. No  sinus tenderness.     Right Ear: No drainage, swelling or tenderness. No foreign body. Tympanic membrane is scarred. Tympanic membrane is not injected, perforated, erythematous, retracted or bulging.     Left Ear: Tympanic membrane normal.     Ears:     Comments: Scarring consistent with history of multiple tubes.     Nose: Nose normal.     Mouth/Throat:     Lips: Lesions (cold sore on right lower lip) present.     Mouth: Mucous membranes are moist. No oral lesions.     Tongue: No lesions. Tongue does not deviate from midline.     Palate: No mass and lesions.     Pharynx: Oropharynx is clear. Uvula midline. No pharyngeal swelling, oropharyngeal exudate, posterior oropharyngeal erythema or uvula swelling.  Eyes:     Conjunctiva/sclera: Conjunctivae normal.  Neck:     Vascular: No JVD.     Comments: Fullness of left lateral neck without defined palpable mass Tenderness to palpation of right lateral neck without swelling or skin changes. No wounds on neck Cardiovascular:     Rate and Rhythm: Regular rhythm. Tachycardia present.     Pulses: Normal pulses.     Heart sounds: Normal heart sounds.  Pulmonary:     Effort: Pulmonary effort is normal. No respiratory distress.     Breath sounds: Normal breath sounds. No stridor. No wheezing, rhonchi or rales.     Comments: Dry cough intermittently during exam Chest:     Chest wall: No tenderness.  Abdominal:     General: There is no distension.  Musculoskeletal:     Cervical back: Normal range of motion.     Comments: Compartments in bilateral upper arms are soft. No swelling appreciated.  Lymphadenopathy:     Cervical:     Right cervical: No superficial, deep or posterior cervical adenopathy.    Left cervical: No superficial, deep or posterior cervical adenopathy.     Upper Body:     Right upper body: No supraclavicular or axillary adenopathy.     Left upper body: No supraclavicular adenopathy.  Skin:    General: Skin is warm and dry.   Neurological:     Mental Status: She is oriented to person, place, and time.  Psychiatric:        Mood and Affect: Mood is anxious.        LABORATORY DATA: I have reviewed the data as listed    Latest Ref Rng & Units 03/12/2022    9:53 AM 09/07/2021   10:58 AM 06/17/2021   11:00 AM  CBC  WBC 4.0 - 10.5 K/uL 6.8  10.8  4.3   Hemoglobin 12.0 - 15.0 g/dL 14.4  14.5  12.1   Hematocrit 36.0 - 46.0 % 40.6  41.5  36.6   Platelets 150 - 400 K/uL 240  244  273         Latest Ref Rng & Units 03/12/2022    9:53 AM 09/07/2021   10:58 AM 06/17/2021   11:00 AM  CMP  Glucose 70 - 99 mg/dL 105  102  110   BUN 6 - 20 mg/dL 12  14  10    Creatinine 0.44 - 1.00 mg/dL  0.74  0.80  0.81   Sodium 135 - 145 mmol/L 139  141  142   Potassium 3.5 - 5.1 mmol/L 4.1  3.2  3.6   Chloride 98 - 111 mmol/L 104  104  106   CO2 22 - 32 mmol/L 25  30  26    Calcium 8.9 - 10.3 mg/dL 10.1  9.8  9.5   Total Protein 6.5 - 8.1 g/dL 7.8  8.3  7.4   Total Bilirubin 0.3 - 1.2 mg/dL 1.3  0.6  0.5   Alkaline Phos 38 - 126 U/L 126  119  161   AST 15 - 41 U/L 55  14  17   ALT 0 - 44 U/L 50  17  18        RADIOGRAPHIC STUDIES (from last 24 hours if applicable) I have personally reviewed the radiological images as listed and agreed with the findings in the report. No results found.      Visit Diagnosis: 1. Cold sore   2. Malignant neoplasm of unspecified part of unspecified bronchus or lung ([place])   3. Stress at home      No orders of the defined types were placed in this encounter.   All questions were answered. The patient knows to call the clinic with any problems, questions or concerns. No barriers to learning was detected.  I have spent a total of 30 minutes minutes of face-to-face and non-face-to-face time, preparing to see the patient, obtaining and/or reviewing separately obtained history, performing a medically appropriate examination, counseling and educating the patient, ordering tests, documenting  clinical information in the electronic health record, and care coordination (communications with other health care professionals or caregivers).    Thank you for allowing me to participate in the care of this patient.    Barrie Folk, PA-C Department of Hematology/Oncology [facility] Phone: [phone]  [phone]    03/12/2022 12:58 PM

## 2022-03-16 ENCOUNTER — Ambulatory Visit (HOSPITAL_COMMUNITY)
Admission: RE | Admit: 2022-03-16 | Discharge: 2022-03-16 | Disposition: A | Discharge status: Home / Self Care | Payer: Commercial Managed Care - PPO | Source: Ambulatory Visit | Attending: Internal Medicine | Admitting: Internal Medicine

## 2022-03-16 DIAGNOSIS — C3490 Malignant neoplasm of unspecified part of unspecified bronchus or lung: Secondary | ICD-10-CM | POA: Insufficient documentation

## 2022-03-16 MED ORDER — IOHEXOL 300 MG/ML  SOLN
75.0000 mL | Freq: Once | INTRAMUSCULAR | Status: AC | PRN
  Administered 2022-03-16: 75 mL via INTRAVENOUS

## 2022-03-16 MED ORDER — SODIUM CHLORIDE (PF) 0.9 % IJ SOLN
INTRAMUSCULAR | Status: AC
  Filled 2022-03-16: qty 50

## 2022-03-17 ENCOUNTER — Telehealth: Payer: Self-pay | Admitting: Internal Medicine

## 2022-03-17 NOTE — Telephone Encounter (Signed)
Patient called to confirm appointment.

## 2022-03-17 NOTE — Telephone Encounter (Signed)
Per 2/14 IB reached out to schedule patient left voicemail will call back to verify with patient.

## 2022-03-23 ENCOUNTER — Inpatient Hospital Stay (HOSPITAL_BASED_OUTPATIENT_CLINIC_OR_DEPARTMENT_OTHER): Payer: Commercial Managed Care - PPO | Admitting: Internal Medicine

## 2022-03-23 ENCOUNTER — Other Ambulatory Visit: Payer: Self-pay

## 2022-03-23 VITALS — BP 125/66 | HR 109 | Temp 98.3°F | Resp 14 | Wt 113.2 lb

## 2022-03-23 DIAGNOSIS — C3411 Malignant neoplasm of upper lobe, right bronchus or lung: Secondary | ICD-10-CM | POA: Diagnosis not present

## 2022-03-23 DIAGNOSIS — C3490 Malignant neoplasm of unspecified part of unspecified bronchus or lung: Secondary | ICD-10-CM

## 2022-03-23 NOTE — Progress Notes (Signed)
[place] [phone]   [phone]  OFFICE PROGRESS NOTE  Julie Beals, NP [place] [address]  DIAGNOSIS: Recurrent locally advanced non-small cell lung cancer initially diagnosed as stage IIIB  (T1b, N3, M0) non-small cell lung cancer, adenocarcinoma she presented with right upper lobe nodule in addition to bulky right hilar, mediastinal, and left supraclavicular lymphadenopathy diagnosed in November 2022.  The patient had evidence for disease recurrence in the mediastinal and right supraclavicular lymphadenopathy in August 2023.  DETECTED ALTERATION(S) / BIOMARKER(S) % CFDNA OR AMPLIFICATION ASSOCIATED FDA-APPROVED THERAPIES CLINICAL TRIAL AVAILABILITY [id number] ND 0.5 5 50 100 4.7%  [id number] ND 0.5 5 50 100 1.8%  [id number] ND 0.5 5 50 100 1.9%  BIOMARKER ADDITIONAL DETAILS Tumor Mutational Burden (TMB) 19.02 mut/Mb MSI Status Stable (MSS) PD-L1 Tumor Proportion Score (TPS)* <1%   PRIOR THERAPY:  1) Concurrent chemoradiation with carboplatin for an AUC of 2 and paclitaxel 45 mg per metered squared.  First dose on 01/05/2021.  Status post 7 cycles of treatment.  Last dose was given February 16, 2021. 2) Consolidation immunotherapy with Imfinzi 1500 Mg IV every 4 weeks.  First dose March 25, 2021.  Status post 3 cycles.  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 3) Palliative radiotherapy to the enlarging right supraclavicular lymphadenopathy under the care of Dr. Sondra Nicholson expected to be completed on October 28, 2021.   CURRENT THERAPY: Observation  INTERVAL HISTORY: Julie Nicholson 57 y.o. female return to the clinic today for follow-up visit accompanied by her daughter Julie Nicholson.  The patient is feeling fine today with no concerning complaints except for intermittent neck pain.  She denied having any current chest pain, shortness of breath but has mild cough with no  hemoptysis.  She has no nausea, vomiting, diarrhea or constipation.  She has no headache or visual changes.  She has no recent weight loss or night sweats.  She had repeat CT scan of the neck and chest performed recently and she is here for evaluation and discussion of her scan results and treatment options.   MEDICAL HISTORY: Past Medical History:  Diagnosis Date   Abdominal discomfort    Cancer ([place])    Chronic headaches    due to allergies, sinus   COPD (chronic obstructive pulmonary disease) ([place])    per 2012 chest xray   pt states she doesn not have this now (04/10/2013)   Deviated nasal septum    Eustachian tube dysfunction    GERD (gastroesophageal reflux disease)    occasional uses Tums / Rolaids   Hearing loss    right ear   High cholesterol    History of radiation therapy    right lung 01/07/2021-02/19/2021  Dr Gery Pray   Migraine    "only once in a blue moon since RX'd allergy shots" (04/10/2013)   Pancreatitis 02/08/2013   Pneumonia    Rhinitis, allergic     ALLERGIES:  is allergic to benadryl [diphenhydramine].  MEDICATIONS:  Current Outpatient Medications  Medication Sig Dispense Refill   acetaminophen (TYLENOL) 500 MG tablet Take 1,000 mg by mouth every 6 (six) hours as needed for moderate pain.     albuterol (PROVENTIL) (2.5 MG/3ML) 0.083% nebulizer solution albuterol sulfate 2.5 mg/3 mL (0.083 %) solution for nebulization  USE 1 VIAL IN NEBULIZER EVERY 6 HOURS AS NEEDED FOR WHEEZING OR SHORTNESS OF BREATH     albuterol (VENTOLIN HFA) 108 (90 Base) MCG/ACT inhaler Inhale  1-2 puffs into the lungs every 6 (six) hours as needed for wheezing or shortness of breath. 54 g 3   amoxicillin-clavulanate (AUGMENTIN) 875-125 MG tablet Take 1 tablet by mouth 2 (two) times daily. 14 tablet 0   aspirin EC 81 MG tablet Take 81 mg by mouth at bedtime.     atorvastatin (LIPITOR) 40 MG tablet Take 40 mg by mouth at bedtime.     benzonatate (TESSALON) 200 MG capsule Take 200 mg by  mouth 3 (three) times daily as needed.     Biotin 5000 MCG TABS Take 5,000 mcg by mouth at bedtime.     buPROPion (ZYBAN) 150 MG 12 hr tablet TAKE 1 TABLET BY MOUTH TWICE A DAY 180 tablet 0   Ca Carbonate-Mag Hydroxide (ROLAIDS PO) Take 1 tablet by mouth daily as needed (heartburn).     fluticasone (FLONASE) 50 MCG/ACT nasal spray Place 2 sprays into both nostrils daily as needed for allergies.     loratadine (CLARITIN) 10 MG tablet Take 1 tablet (10 mg total) by mouth daily. 90 tablet 1   Multiple Vitamins-Minerals (HAIR SKIN AND NAILS FORMULA PO) Take 1 tablet by mouth daily.     ondansetron (ZOFRAN-ODT) 8 MG disintegrating tablet Take 8 mg by mouth 3 (three) times daily as needed.     Oxycodone HCl 10 MG TABS Take 1 tablet by mouth 3 (three) times daily as needed (pain).     sucralfate (CARAFATE) 1 g tablet Take 1 tablet (1 g total) by mouth 4 (four) times daily -  with meals and at bedtime. Crush and dissolve in 10 mL of warm water prior to swallowing 120 tablet 1   Tiotropium Bromide-Olodaterol (STIOLTO RESPIMAT) 2.5-2.5 MCG/ACT AERS Inhale 2 puffs into the lungs daily. 4 g 3   UBRELVY 100 MG TABS Take 100 mg by mouth daily as needed (migraine).     No current facility-administered medications for this visit.    SURGICAL HISTORY:  Past Surgical History:  Procedure Laterality Date   ABDOMINAL HYSTERECTOMY  1995   tx endometriosis, both ovaries removed   APPENDECTOMY  late 1990's   BIOPSY  02/26/2021   Procedure: BIOPSY;  Surgeon: Milus Banister, MD;  Location: [facility];  Service: Endoscopy;;   BRONCHIAL BRUSHINGS  12/15/2020   Procedure: BRONCHIAL BRUSHINGS;  Surgeon: Collene Gobble, MD;  Location: [facility];  Service: Cardiopulmonary;;   BRONCHIAL NEEDLE ASPIRATION BIOPSY  12/15/2020   Procedure: BRONCHIAL NEEDLE ASPIRATION BIOPSIES;  Surgeon: Collene Gobble, MD;  Location: [facility];  Service: Cardiopulmonary;;   CHOLECYSTECTOMY  04/10/2013   CHOLECYSTECTOMY N/A  04/10/2013   Procedure: LAPAROSCOPIC CHOLECYSTECTOMY WITH INTRAOPERATIVE CHOLANGIOGRAM;  Surgeon: Odis Hollingshead, MD;  Location: [facility];  Service: General;  Laterality: N/A;   ELECTROMAGNETIC NAVIGATION BROCHOSCOPY  12/15/2020   Procedure: ELECTROMAGNETIC NAVIGATION BRONCHOSCOPY;  Surgeon: Collene Gobble, MD;  [facility];  Service: Cardiopulmonary;;   ESOPHAGOGASTRODUODENOSCOPY (EGD) WITH PROPOFOL N/A 02/26/2021   Procedure: ESOPHAGOGASTRODUODENOSCOPY (EGD) WITH PROPOFOL;  Surgeon: Milus Banister, MD;  Location: [facility];  Service: Endoscopy;  Laterality: N/A;   EUS N/A 02/16/2013   Procedure: UPPER ENDOSCOPIC ULTRASOUND (EUS) LINEAR;  Surgeon: Milus Banister, MD;  Location: [facility];  Service: Endoscopy;  Laterality: N/A;  radial linear   KNEE ARTHROSCOPY Right 1980's   "cartilage OR"   LAPAROSCOPIC ENDOMETRIOSIS FULGURATION  1980's   MYRINGOTOMY WITH TUBE PLACEMENT Right 07/13/2018   Procedure: MYRINGOTOMY WITH TUBE PLACEMENT;  Surgeon: Margaretha Sheffield, MD;  Location:  [place];  Service: ENT;  Laterality: Right;   MYRINGOTOMY WITH TUBE PLACEMENT Right 06/04/2021   Procedure: MYRINGOTOMY WITH BUTTERFLY TUBE PLACEMENT;  Surgeon: Margaretha Sheffield, MD;  Location: [place];  Service: ENT;  Laterality: Right;   NASOPHARYNGOSCOPY EUSTATION TUBE BALLOON DILATION Right 07/13/2018   Procedure: NASOPHARYNGOSCOPY EUSTATION TUBE BALLOON DILATION;  Surgeon: Margaretha Sheffield, MD;  Location: [facility];  Service: ENT;  Laterality: Right;   TONSILLECTOMY AND ADENOIDECTOMY  ~ 1980   adenoidectomy   TUBAL LIGATION  ~ [place] Right 07/13/2018   Procedure: OUTFRACTURE TURBINATE;  Surgeon: Margaretha Sheffield, MD;  Location: [place];  Service: ENT;  Laterality: Right;   VIDEO BRONCHOSCOPY WITH ENDOBRONCHIAL ULTRASOUND N/A 12/15/2020   Procedure: ROBOTIC VIDEO BRONCHOSCOPY WITH ENDOBRONCHIAL ULTRASOUND;  Surgeon: Collene Gobble, MD;  Location:  [facility];  Service: Cardiopulmonary;  Laterality: N/A;   WRIST SURGERY Left    w/plate    REVIEW OF SYSTEMS:  Constitutional: positive for fatigue Eyes: negative Ears, nose, mouth, throat, and face: negative Respiratory: positive for cough Cardiovascular: negative Gastrointestinal: negative Genitourinary:negative Integument/breast: negative Hematologic/lymphatic: negative Musculoskeletal:positive for back pain and neck pain Neurological: negative Behavioral/Psych: positive for anxiety Endocrine: negative Allergic/Immunologic: negative   PHYSICAL EXAMINATION: General appearance: alert, cooperative, and no distress Head: Normocephalic, without obvious abnormality, atraumatic Neck: moderate anterior cervical adenopathy, no JVD, supple, symmetrical, trachea midline, and thyroid not enlarged, symmetric, no tenderness/mass/nodules Lymph nodes: Cervical, supraclavicular, and axillary nodes normal. Resp: clear to auscultation bilaterally Back: symmetric, no curvature. ROM normal. No CVA tenderness. Cardio: regular rate and rhythm, S1, S2 normal, no murmur, click, rub or gallop GI: soft, non-tender; bowel sounds normal; no masses,  no organomegaly Extremities: extremities normal, atraumatic, no cyanosis or edema Neurologic: Alert and oriented X 3, normal strength and tone. Normal symmetric reflexes. Normal coordination and gait  ECOG PERFORMANCE STATUS: 1 - Symptomatic but completely ambulatory  Blood pressure 125/66, pulse (!) 109, temperature 98.3 F (36.8 C), temperature source Oral, resp. rate 14, weight 113 lb 3.2 oz (51.3 kg), SpO2 100 %.  LABORATORY DATA: Lab Results  Component Value Date   WBC 6.8 03/12/2022   HGB 14.4 03/12/2022   HCT 40.6 03/12/2022   MCV 93.1 03/12/2022   PLT 240 03/12/2022      Chemistry      Component Value Date/Time   NA 139 03/12/2022 0953   K 4.1 03/12/2022 0953   CL 104 03/12/2022 0953   CO2 25 03/12/2022 0953   BUN 12 03/12/2022 0953    CREATININE 0.74 03/12/2022 0953      Component Value Date/Time   CALCIUM 10.1 03/12/2022 0953   ALKPHOS 126 03/12/2022 0953   AST 55 (H) 03/12/2022 0953   ALT 50 (H) 03/12/2022 0953   BILITOT 1.3 (H) 03/12/2022 0953       RADIOGRAPHIC STUDIES: CT Soft Tissue Neck W Contrast  Result Date: 03/16/2022 CLINICAL DATA:  Neck mass, nonpulsatile. EXAM: CT NECK WITH CONTRAST TECHNIQUE: Multidetector CT imaging of the neck was performed using the standard protocol following the bolus administration of intravenous contrast. RADIATION DOSE REDUCTION: This exam was performed according to the departmental dose-optimization program which includes automated exposure control, adjustment of the mA and/or kV according to patient size and/or use of iterative reconstruction technique. CONTRAST:  39mL OMNIPAQUE IOHEXOL 300 MG/ML  SOLN COMPARISON:  Neck CT 01/04/2022. FINDINGS: Pharynx and larynx: Normal. No mass or swelling. Salivary glands: No inflammation, mass, or stone. Thyroid: Normal. Lymph  nodes: Newly enlarged left level 5 B lymph node measuring up to 10 mm in short axis (image 71 series 2). Within the limits of motion artifact, the previously seen left level 4 lymph nodes unchanged, measuring up to 8 mm in short axis (image 71 series 2). Within limits of motion artifact, the right level 4 lymph node is unchanged measuring up to 7 mm in short axis (image 74 series 2). Vascular: Atherosclerotic calcifications of the carotid bulbs. Limited intracranial: Unremarkable. Visualized orbits: Unremarkable. Mastoids and visualized paranasal sinuses: Mucous retention cysts in the right maxillary sinus. Mastoids are well aerated. Skeleton: Mild cervical spondylosis.  No suspicious bone lesions. Upper chest: Increased mediastinal lymphadenopathy. Other: None. IMPRESSION: 1. Newly enlarged left level 5 B lymph node measuring up to 10 mm in short axis, concerning for new metastatic disease. 2. Increased mediastinal  lymphadenopathy. 3. Within the limits of motion artifact, the previously seen level 4 lymph nodes are unchanged. Electronically Signed   By: Emmit Alexanders M.D.   On: 03/16/2022 13:36   CT Chest W Contrast  Result Date: 03/16/2022 CLINICAL DATA:  Non-small cell lung cancer, restaging EXAM: CT CHEST WITH CONTRAST TECHNIQUE: Multidetector CT imaging of the chest was performed during intravenous contrast administration. RADIATION DOSE REDUCTION: This exam was performed according to the departmental dose-optimization program which includes automated exposure control, adjustment of the mA and/or kV according to patient size and/or use of iterative reconstruction technique. CONTRAST:  62mL OMNIPAQUE IOHEXOL 300 MG/ML  SOLN COMPARISON:  01/04/2022 FINDINGS: Cardiovascular: The heart is normal in size. No pericardial effusion. No evidence of thoracic aortic aneurysm. Mild coronary atherosclerosis of the LAD. Mediastinum/Nodes: Right supraclavicular nodes measuring up to 10 mm short axis (series 2/image 13), new. Amorphous nodal soft tissue in the right paratracheal region measuring 10 mm short axis (series 2/image 36), new. 11 mm short axis low right paratracheal/AP window node (series 2/image 47), new. Additional amorphous soft tissue in the right hilar and subcarinal regions (series 2/images 54 in 58), new. This appearance is worrisome for nodal recurrence. Visualized thyroid is unremarkable. Lungs/Pleura: Radiation changes in the central right upper lobe, right perihilar, and right paramediastinal regions, unchanged. No new/suspicious pulmonary nodules. Moderate centrilobular and paraseptal emphysematous changes, upper lung predominant. No focal consolidation. No pleural effusion or pneumothorax. Upper Abdomen: Visualized upper abdomen is notable for vascular calcifications, prior cholecystectomy, again incompletely visualized 11 mm right upper pole renal cyst (series 2/image 47). Musculoskeletal: Visualized osseous  structures are within normal limits. IMPRESSION: Radiation changes in the right upper lobe/perihilar region. New thoracic lymphadenopathy, as above, suspicious for nodal recurrence. Emphysema (ICD10-J43.9). Electronically Signed   By: Julian Hy M.D.   On: 03/16/2022 13:17    ASSESSMENT AND PLAN: This is a very pleasant 57 years old white female with stage IIIB (T1b, N3, M0) non-small cell lung cancer, adenocarcinoma diagnosed in November 2022 with no actionable mutation and negative PD-L1 expression. The patient completed a course of concurrent chemoradiation with weekly carboplatin for AUC of 2 and paclitaxel 45 Mg/M2 status post 7 cycles.  She has been tolerating her treatment well except for the mild odynophagia and skin burns. She was also recently admitted to the hospital complaining of dysphagia and odynophagia secondary to radiation induced esophagitis.  She is feeling much better but continues to have residual dysphagia.  She is followed by gastroenterology and was seen by Dr. Ardis Hughs during her hospitalization. Her scan showed improvement of her disease. I recommended for the patient treatment with consolidation immunotherapy  with Imfinzi 1500 Mg IV every 4 weeks.  Status post 3 cycles.  Last dose was given in April 2023.  Her treatment was discontinued secondary to suspicious immunotherapy mediated pneumonitis with significant shortness of breath at that time and she was treated with a tapered dose of prednisone. Unfortunately her scan showed evidence for disease recurrence with enlargement of lower right cervical lymph nodes as well as mediastinal lymphadenopathy.  She has palpable right cervical lymphadenopathy. 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 The patient underwent ultrasound-guided core biopsy of the right supraclavicular lymph nodes but the final pathology showed necrotic tumor cells with complete coagulative necrosis with focal fibrous tissue and histiocytic reaction.  She also had MRI of the brain that showed no evidence of metastatic disease to the brain. The patient was seen by Dr. Sondra Nicholson and started palliative radiotherapy to the enlarging right supraclavicular lymphadenopathy.  This was completed on October 28, 2021. The patient is currently on observation but recently she started having right-sided neck pain.  She had repeat CT scan of the neck and chest performed recently.  I personally and independently reviewed the scan images and discussed the result with the patient and her daughter. Her scan showed worsening lymphadenopathy in the neck and mediastinum concerning for disease recurrence. I recommended for the patient to have a PET scan for further evaluation of her disease and to rule out any disease recurrence in the neck and the chest. I will see her back for follow-up visit in 2 weeks for evaluation and discussion of her PET scan results and further recommendation regarding treatment of her condition. The patient was advised to call immediately if she has any other concerning symptoms in the interval. The patient voices understanding of current disease status and treatment options and is in agreement with the current care plan.  All questions were answered. The patient knows to call the clinic with any problems, questions or concerns. We can certainly see the patient much sooner if necessary.  The total time spent in the appointment was 30 minutes.  Disclaimer: This note was dictated with voice recognition software. Similar sounding words can inadvertently be transcribed and may not be corrected upon review.

## 2022-03-23 NOTE — Addendum Note (Signed)
Addended by: Ardeen Garland on: 03/23/2022 12:44 PM   Modules accepted: Orders

## 2022-03-24 ENCOUNTER — Telehealth: Payer: Self-pay | Admitting: Internal Medicine

## 2022-03-24 NOTE — Telephone Encounter (Signed)
Scheduled per 02/20 los, patient has been called and notified.

## 2022-03-26 ENCOUNTER — Other Ambulatory Visit (HOSPITAL_COMMUNITY): Payer: Commercial Managed Care - PPO

## 2022-03-26 ENCOUNTER — Ambulatory Visit
Admission: RE | Admit: 2022-03-26 | Discharge: 2022-03-26 | Disposition: A | Discharge status: Home / Self Care | Payer: Commercial Managed Care - PPO | Source: Ambulatory Visit | Attending: Internal Medicine | Admitting: Internal Medicine

## 2022-03-26 ENCOUNTER — Telehealth: Payer: Self-pay | Admitting: *Deleted

## 2022-03-26 DIAGNOSIS — C3490 Malignant neoplasm of unspecified part of unspecified bronchus or lung: Secondary | ICD-10-CM | POA: Insufficient documentation

## 2022-03-26 DIAGNOSIS — I700 Atherosclerosis of aorta: Secondary | ICD-10-CM | POA: Insufficient documentation

## 2022-03-26 DIAGNOSIS — J439 Emphysema, unspecified: Secondary | ICD-10-CM | POA: Insufficient documentation

## 2022-03-26 DIAGNOSIS — R591 Generalized enlarged lymph nodes: Secondary | ICD-10-CM | POA: Insufficient documentation

## 2022-03-26 LAB — GLUCOSE, CAPILLARY: Glucose-Capillary: 97 mg/dL (ref 70–99)

## 2022-03-26 MED ORDER — FLUDEOXYGLUCOSE F - 18 (FDG) INJECTION
5.7600 | Freq: Once | INTRAVENOUS | Status: AC | PRN
  Administered 2022-03-26: 5.76 via INTRAVENOUS

## 2022-03-26 NOTE — Telephone Encounter (Signed)
Received PC from patient, she states she had PET scan today & she wants to report she noticed swelling on the L side of her neck when she woke up this morning, is tender & sore.  She feels this in her neck & some in her shoulder. She has scheduled appointment to discuss PET results with Dr Julien Nordmann on March 6.   C. Heilingoetter PA informed, she can see patient Monday 03/29/22 at either 8:00 or 10:30.  Patient prefers 10:30, Cassie informed, appointment scheduled.  Informed patient she may take tylenol for pain over the weekend if needed.  Patient verbalizes understanding.

## 2022-03-26 NOTE — Progress Notes (Unsigned)
[facility] OFFICE PROGRESS NOTE  Julie Beals, NP [place] [address]  DIAGNOSIS:  Recurrent/Metastatic lung cancer, initially diagnosed as locally advanced non-small cell lung cancer initially diagnosed as stage IIIB  (T1b, N3, M0) non-small cell lung cancer, adenocarcinoma. She initially presented with right upper lobe nodule in addition to bulky right hilar, mediastinal, and left supraclavicular lymphadenopathy diagnosed in November 2022.  The patient had evidence for disease recurrence in the mediastinal and right supraclavicular lymphadenopathy in August 2023. She them developed metastatic disease in February 2024 with supraclavicular and thoracic adenopathy, new precarinal lymph node, and upper abdominal adenopathy and a small hypermetabolic lesion in the liver   DETECTED ALTERATION(S) / BIOMARKER(S)     % CFDNA OR AMPLIFICATION       ASSOCIATED FDA-APPROVED THERAPIES        CLINICAL TRIAL AVAILABILITY [id number] ND 0.5 5 50 100 4.7%   [id number] ND 0.5 5 50 100 1.8%   [id number] ND 0.5 5 50 100 1.9%   BIOMARKER  ADDITIONAL DETAILS Tumor Mutational Burden (TMB) 19.02 mut/Mb MSI Status Stable (MSS) PD-L1 Tumor Proportion Score (TPS)* <1%  PRIOR THERAPY: 1) Concurrent chemoradiation with carboplatin for an AUC of 2 and paclitaxel 45 mg per metered squared.  First dose on 01/05/2021.  Status post 7 cycles of treatment.  Last dose was given February 16, 2021. 2) Consolidation immunotherapy with Imfinzi 1500 Mg IV every 4 weeks.  First dose March 25, 2021.  Status post 3 cycles.  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 3) Palliative radiotherapy to the enlarging right supraclavicular lymphadenopathy under the care of Dr. Sondra Come expected to be completed on October 28, 2021  CURRENT THERAPY: Palliative systemic chemotherapy with carboplatin for an AUC of 5, Alimta 500 mg/m, Keytruda 200 mg IV every 3  weeks first dose expected on 04/05/22.   INTERVAL HISTORY: Julie SCHLEMMER 57 y.o. female returns to the clinic today for a follow-up visit accompanied by her daughter. The patient is followed by our clinic for history of stage III lung cancer.  However, more recently, she has called the clinic endorsing intermittent neck pain and swelling.  She had a restaging CT scan of the chest and neck that showed concerns for disease progression with adenopathy.  The patient saw Dr. Julien Nordmann on 03/23/2022 and arranged for a PET scan to further evaluate this.    The patient had her PET scan on Friday.  She called the clinic afterwards endorsing worsening left-sided neck swelling and discomfort for which she takes Advil.  Since last being seen the patient denies any further changes in her health.  Denies any fever, chills, or unexplained weight loss.  She has night sweats all the time which is not new for her.  She reports that breathing is good.  In general, she does not have any shortness of breath unless she has a coughing spell.  Denies any chest pain or hemoptysis.  What is she take?  She denies any nausea, vomiting, or diarrhea.  She sometimes has constipation which she is not sure if it is attributed to her pain medication use because she notices it more frequently when she has protein supplement drinks.  She sometimes may have a headache but nothing unusual. She is here today to review her PET scan results and for more detailed discussion about her current condition and recommended treatment options.  MEDICAL HISTORY: Past Medical History:  Diagnosis Date   Abdominal discomfort  Cancer [facility])    Chronic headaches    due to allergies, sinus   COPD (chronic obstructive pulmonary disease) ([place])    per 2012 chest xray   pt states she doesn not have this now (04/10/2013)   Deviated nasal septum    Eustachian tube dysfunction    GERD (gastroesophageal reflux disease)    occasional uses Tums / Rolaids   Hearing  loss    right ear   High cholesterol    History of radiation therapy    right lung 01/07/2021-02/19/2021  Dr Gery Pray   Migraine    "only once in a blue moon since RX'd allergy shots" (04/10/2013)   Pancreatitis 02/08/2013   Pneumonia    Rhinitis, allergic     ALLERGIES:  is allergic to benadryl [diphenhydramine].  MEDICATIONS:  Current Outpatient Medications  Medication Sig Dispense Refill   folic acid (FOLVITE) 1 MG tablet Take 1 tablet (1 mg total) by mouth daily. 30 tablet 2   prochlorperazine (COMPAZINE) 10 MG tablet Take 1 tablet (10 mg total) by mouth every 6 (six) hours as needed. 30 tablet 2   acetaminophen (TYLENOL) 500 MG tablet Take 1,000 mg by mouth every 6 (six) hours as needed for moderate pain.     albuterol (PROVENTIL) (2.5 MG/3ML) 0.083% nebulizer solution albuterol sulfate 2.5 mg/3 mL (0.083 %) solution for nebulization  USE 1 VIAL IN NEBULIZER EVERY 6 HOURS AS NEEDED FOR WHEEZING OR SHORTNESS OF BREATH     amoxicillin-clavulanate (AUGMENTIN) 875-125 MG tablet Take 1 tablet by mouth 2 (two) times daily. 14 tablet 0   aspirin EC 81 MG tablet Take 81 mg by mouth at bedtime.     atorvastatin (LIPITOR) 40 MG tablet Take 40 mg by mouth at bedtime.     benzonatate (TESSALON) 200 MG capsule Take 200 mg by mouth 3 (three) times daily as needed.     Biotin 5000 MCG TABS Take 5,000 mcg by mouth at bedtime.     buPROPion (WELLBUTRIN XL) 150 MG 24 hr tablet Take 1 tablet by mouth daily.     buPROPion (ZYBAN) 150 MG 12 hr tablet TAKE 1 TABLET BY MOUTH TWICE A DAY 180 tablet 0   Ca Carbonate-Mag Hydroxide (ROLAIDS PO) Take 1 tablet by mouth daily as needed (heartburn).     fluticasone (FLONASE) 50 MCG/ACT nasal spray Place 2 sprays into both nostrils daily as needed for allergies.     loratadine (CLARITIN) 10 MG tablet Take 1 tablet (10 mg total) by mouth daily. 90 tablet 1   Multiple Vitamins-Minerals (HAIR SKIN AND NAILS FORMULA PO) Take 1 tablet by mouth daily.      ondansetron (ZOFRAN-ODT) 8 MG disintegrating tablet Take 8 mg by mouth 3 (three) times daily as needed.     Oxycodone HCl 10 MG TABS Take 1 tablet by mouth 3 (three) times daily as needed (pain).     sertraline (ZOLOFT) 50 MG tablet Take 1 tablet by mouth daily.     Tiotropium Bromide-Olodaterol (STIOLTO RESPIMAT) 2.5-2.5 MCG/ACT AERS Inhale 2 puffs into the lungs daily. 4 g 3   No current facility-administered medications for this visit.    SURGICAL HISTORY:  Past Surgical History:  Procedure Laterality Date   ABDOMINAL HYSTERECTOMY  1995   tx endometriosis, both ovaries removed   APPENDECTOMY  late 1990's   BIOPSY  02/26/2021   Procedure: BIOPSY;  Surgeon: Milus Banister, MD;  Location: [facility];  Service: Endoscopy;;   BRONCHIAL BRUSHINGS  12/15/2020  Procedure: BRONCHIAL BRUSHINGS;  Surgeon: Collene Gobble, MD;  Location: [facility];  Service: Cardiopulmonary;;   BRONCHIAL NEEDLE ASPIRATION BIOPSY  12/15/2020   Procedure: BRONCHIAL NEEDLE ASPIRATION BIOPSIES;  Surgeon: Collene Gobble, MD;  Location: [facility] ENDOSCOPY;  Service: Cardiopulmonary;;   CHOLECYSTECTOMY  04/10/2013   CHOLECYSTECTOMY N/A 04/10/2013   Procedure: LAPAROSCOPIC CHOLECYSTECTOMY WITH INTRAOPERATIVE CHOLANGIOGRAM;  Surgeon: Odis Hollingshead, MD;  Location: [facility];  Service: General;  Laterality: N/A;   ELECTROMAGNETIC NAVIGATION BROCHOSCOPY  12/15/2020   Procedure: ELECTROMAGNETIC NAVIGATION BRONCHOSCOPY;  Surgeon: Collene Gobble, MD;  Location: [facility] ENDOSCOPY;  Service: Cardiopulmonary;;   ESOPHAGOGASTRODUODENOSCOPY (EGD) WITH PROPOFOL N/A 02/26/2021   Procedure: ESOPHAGOGASTRODUODENOSCOPY (EGD) WITH PROPOFOL;  Surgeon: Milus Banister, MD;  Location: [facility];  Service: Endoscopy;  Laterality: N/A;   EUS N/A 02/16/2013   Procedure: UPPER ENDOSCOPIC ULTRASOUND (EUS) LINEAR;  Surgeon: Milus Banister, MD;  Location: [facility];  Service: Endoscopy;  Laterality: N/A;  radial linear   KNEE ARTHROSCOPY Right  1980's   "cartilage OR"   LAPAROSCOPIC ENDOMETRIOSIS FULGURATION  1980's   MYRINGOTOMY WITH TUBE PLACEMENT Right 07/13/2018   Procedure: MYRINGOTOMY WITH TUBE PLACEMENT;  Surgeon: Margaretha Sheffield, MD;  Location: [facility];  Service: ENT;  Laterality: Right;   MYRINGOTOMY WITH TUBE PLACEMENT Right 06/04/2021   Procedure: MYRINGOTOMY WITH BUTTERFLY TUBE PLACEMENT;  Surgeon: Margaretha Sheffield, MD;  Location: [facility];  Service: ENT;  Laterality: Right;   NASOPHARYNGOSCOPY EUSTATION TUBE BALLOON DILATION Right 07/13/2018   Procedure: NASOPHARYNGOSCOPY EUSTATION TUBE BALLOON DILATION;  Surgeon: Margaretha Sheffield, MD;  Location: [facility];  Service: ENT;  Laterality: Right;   TONSILLECTOMY AND ADENOIDECTOMY  ~ 1980   adenoidectomy   TUBAL LIGATION  ~ [place] Right 07/13/2018   Procedure: OUTFRACTURE TURBINATE;  Surgeon: Margaretha Sheffield, MD;  Location: [place];  Service: ENT;  Laterality: Right;   VIDEO BRONCHOSCOPY WITH ENDOBRONCHIAL ULTRASOUND N/A 12/15/2020   Procedure: ROBOTIC VIDEO BRONCHOSCOPY WITH ENDOBRONCHIAL ULTRASOUND;  Surgeon: Collene Gobble, MD;  Location: [facility];  Service: Cardiopulmonary;  Laterality: N/A;   WRIST SURGERY Left    w/plate    REVIEW OF SYSTEMS:   Review of Systems  Constitutional: Negative for appetite change, chills, fatigue, fever and unexpected weight change.  HENT: Positive for bilateral clavicular lymphadenopathy left greater than right with associated tenderness.  Negative for mouth sores, nosebleeds, sore throat and trouble swallowing.   Eyes: Negative for eye problems and icterus.  Respiratory: Negative for cough, hemoptysis, shortness of breath and wheezing.   Cardiovascular: Negative for chest pain and leg swelling.  Gastrointestinal: Positive for occasional constipation.  Negative for abdominal pain, diarrhea, nausea and vomiting.  Genitourinary: Negative for bladder incontinence, difficulty  urinating, dysuria, frequency and hematuria.   Musculoskeletal: Negative for back pain, gait problem, and neck stiffness.  Skin: Negative for itching and rash.  Neurological: Negative for dizziness, extremity weakness, gait problem, headaches, light-headedness and seizures.  Hematological: Negative for adenopathy. Does not bruise/bleed easily.  Psychiatric/Behavioral: Negative for confusion, depression and sleep disturbance. The patient is not nervous/anxious.     PHYSICAL EXAMINATION:  Blood pressure 112/64, pulse (!) 118, temperature 97.8 F (36.6 C), temperature source Temporal, resp. rate 16, height '5\' 2"'$  (1.575 m), weight 112 lb 1.6 oz (50.8 kg), SpO2 99 %.  ECOG PERFORMANCE STATUS: 1  Physical Exam  Constitutional: Oriented to person, place, and time and thin appearing female, and in no distress. HENT:  Head: Normocephalic and atraumatic.  Mouth/Throat: Oropharynx is clear and moist. No oropharyngeal exudate.  Eyes: Conjunctivae are normal. Right eye exhibits no discharge. Left eye exhibits no discharge. No scleral icterus.  Neck: Normal range of motion. Neck supple.  Cardiovascular: Normal rate, regular rhythm, normal heart sounds and intact distal pulses.   Pulmonary/Chest: Effort normal and breath sounds normal. No respiratory distress. No wheezes. No rales.  Abdominal: Soft. Bowel sounds are normal. Exhibits no distension and no mass. There is no tenderness.  Musculoskeletal: Normal range of motion. Exhibits no edema.  Lymphadenopathy:  Bilateral supraclavicular lymphadenopathy, left greater than right.  Tenderness to touch left adenopathy. Neurological: Alert and oriented to person, place, and time. Exhibits normal muscle tone. Gait normal. Coordination normal.  Skin: Skin is warm and dry. No rash noted. Not diaphoretic. No erythema. No pallor.  Psychiatric: Mood, memory and judgment normal.  Vitals reviewed.  LABORATORY DATA: Lab Results  Component Value Date   WBC 6.8  03/12/2022   HGB 14.4 03/12/2022   HCT 40.6 03/12/2022   MCV 93.1 03/12/2022   PLT 240 03/12/2022      Chemistry      Component Value Date/Time   NA 139 03/12/2022 0953   K 4.1 03/12/2022 0953   CL 104 03/12/2022 0953   CO2 25 03/12/2022 0953   BUN 12 03/12/2022 0953   CREATININE 0.74 03/12/2022 0953      Component Value Date/Time   CALCIUM 10.1 03/12/2022 0953   ALKPHOS 126 03/12/2022 0953   AST 55 (H) 03/12/2022 0953   ALT 50 (H) 03/12/2022 0953   BILITOT 1.3 (H) 03/12/2022 0953       RADIOGRAPHIC STUDIES:  NM PET Image Restage (PS) Skull Base to Thigh (F-18 FDG)  Result Date: 03/29/2022 CLINICAL DATA:  Subsequent treatment strategy for non-small cell lung cancer. EXAM: NUCLEAR MEDICINE PET SKULL BASE TO THIGH TECHNIQUE: 5.8 mCi F-18 FDG was injected intravenously. Full-ring PET imaging was performed from the skull base to thigh after the radiotracer. CT data was obtained and used for attenuation correction and anatomic localization. Fasting blood glucose: 97 mg/dl COMPARISON:  Chest CT 03/16/2022.  PET-CT 09/16/2021. FINDINGS: Mediastinal blood pool activity: SUV max 1.9 Liver activity: SUV max NA NECK: No hypermetabolic lymph nodes in the neck. Incidental CT findings: None. CHEST: Supraclavicular and thoracic inlet lymph nodes are hypermetabolic bilaterally. Left-sided nodes demonstrate SUV max = 12.9 which compares to 5.6 previously. Right-sided nodes demonstrating SUV max = 7.4 compared to 8.5 previously. 10 mm short axis right paratracheal node measured on chest CT 03/16/2022 shows low level FDG uptake with SUV max = 2.8 compared to 5.0 on previous PET-CT. 11 mm precarinal node demonstrates SUV max = 14.1 today. No hypermetabolic lymph node at this location on previous PET-CT. Hypermetabolism in the upper right hilar region demonstrates SUV max = 3.9 today compared to 6.1 previously. Incidental CT findings: No pericardial effusion. Centrilobular and paraseptal emphysema evident.  Parahilar scarring again noted in the right lung. ABDOMEN/PELVIS: New hypermetabolic lymphadenopathy is identified in the upper abdomen/region of porta hepatis. 13 mm short axis celiac axis node on 133/2 is new since prior PET-CT with SUV max = 8.3. 10 mm short axis pre caval node on 135/2 is new in the interval since prior PET-CT with SUV max = 7.5. Small left para-aortic nodes on [id number] are hypermetabolic with SUV max = 5.0. Focal hypermetabolism identified in the medial right liver corresponding to a subtle 8 mm hypodensity on image 123/2. SUV max = 5.1. No hypermetabolic lymphadenopathy  in the pelvis. Incidental CT findings: Gallbladder surgically absent. No adrenal nodule or mass. There is mild atherosclerotic calcification of the abdominal aorta without aneurysm. SKELETON: No focal hypermetabolic activity to suggest skeletal metastasis. Incidental CT findings: None. IMPRESSION: 1. There is persistent hypermetabolic supraclavicular and thoracic inlet lymphadenopathy, some of which shows slightly increased hypermetabolism and some of which shows slightly decreased hypermetabolism. 2. There is a new hypermetabolic precarinal lymph node on today's study consistent with new metastatic involvement. 3. Interval development of hypermetabolic lymphadenopathy in the upper abdomen consistent with new metastatic disease. 4. Small hypermetabolic lesion in the medial right liver is new in the interval, consistent with metastatic disease. 5. Aortic Atherosclerosis (ICD10-I70.0) and Emphysema (ICD10-J43.9). Electronically Signed   By: Misty Stanley M.D.   On: 03/29/2022 10:06   CT Soft Tissue Neck W Contrast  Result Date: 03/16/2022 CLINICAL DATA:  Neck mass, nonpulsatile. EXAM: CT NECK WITH CONTRAST TECHNIQUE: Multidetector CT imaging of the neck was performed using the standard protocol following the bolus administration of intravenous contrast. RADIATION DOSE REDUCTION: This exam was performed according to the  departmental dose-optimization program which includes automated exposure control, adjustment of the mA and/or kV according to patient size and/or use of iterative reconstruction technique. CONTRAST:  30m OMNIPAQUE IOHEXOL 300 MG/ML  SOLN COMPARISON:  Neck CT 01/04/2022. FINDINGS: Pharynx and larynx: Normal. No mass or swelling. Salivary glands: No inflammation, mass, or stone. Thyroid: Normal. Lymph nodes: Newly enlarged left level 5 B lymph node measuring up to 10 mm in short axis (image 71 series 2). Within the limits of motion artifact, the previously seen left level 4 lymph nodes unchanged, measuring up to 8 mm in short axis (image 71 series 2). Within limits of motion artifact, the right level 4 lymph node is unchanged measuring up to 7 mm in short axis (image 74 series 2). Vascular: Atherosclerotic calcifications of the carotid bulbs. Limited intracranial: Unremarkable. Visualized orbits: Unremarkable. Mastoids and visualized paranasal sinuses: Mucous retention cysts in the right maxillary sinus. Mastoids are well aerated. Skeleton: Mild cervical spondylosis.  No suspicious bone lesions. Upper chest: Increased mediastinal lymphadenopathy. Other: None. IMPRESSION: 1. Newly enlarged left level 5 B lymph node measuring up to 10 mm in short axis, concerning for new metastatic disease. 2. Increased mediastinal lymphadenopathy. 3. Within the limits of motion artifact, the previously seen level 4 lymph nodes are unchanged. Electronically Signed   By: WEmmit AlexandersM.D.   On: 03/16/2022 13:36   CT Chest W Contrast  Result Date: 03/16/2022 CLINICAL DATA:  Non-small cell lung cancer, restaging EXAM: CT CHEST WITH CONTRAST TECHNIQUE: Multidetector CT imaging of the chest was performed during intravenous contrast administration. RADIATION DOSE REDUCTION: This exam was performed according to the departmental dose-optimization program which includes automated exposure control, adjustment of the mA and/or kV according  to patient size and/or use of iterative reconstruction technique. CONTRAST:  744mOMNIPAQUE IOHEXOL 300 MG/ML  SOLN COMPARISON:  01/04/2022 FINDINGS: Cardiovascular: The heart is normal in size. No pericardial effusion. No evidence of thoracic aortic aneurysm. Mild coronary atherosclerosis of the LAD. Mediastinum/Nodes: Right supraclavicular nodes measuring up to 10 mm short axis (series 2/image 13), new. Amorphous nodal soft tissue in the right paratracheal region measuring 10 mm short axis (series 2/image 36), new. 11 mm short axis low right paratracheal/AP window node (series 2/image 47), new. Additional amorphous soft tissue in the right hilar and subcarinal regions (series 2/images 54 in 58), new. This appearance is worrisome for nodal recurrence. Visualized thyroid  is unremarkable. Lungs/Pleura: Radiation changes in the central right upper lobe, right perihilar, and right paramediastinal regions, unchanged. No new/suspicious pulmonary nodules. Moderate centrilobular and paraseptal emphysematous changes, upper lung predominant. No focal consolidation. No pleural effusion or pneumothorax. Upper Abdomen: Visualized upper abdomen is notable for vascular calcifications, prior cholecystectomy, again incompletely visualized 11 mm right upper pole renal cyst (series 2/image 47). Musculoskeletal: Visualized osseous structures are within normal limits. IMPRESSION: Radiation changes in the right upper lobe/perihilar region. New thoracic lymphadenopathy, as above, suspicious for nodal recurrence. Emphysema (ICD10-J43.9). Electronically Signed   By: Julian Hy M.D.   On: 03/16/2022 13:17     ASSESSMENT/PLAN:  This is a very pleasant 57 year old Caucasian female with recurrent lung cancer initially diagnosed as stage IIIB (T1b, N3, M0) non-small cell lung cancer, adenocarcinoma.  She presented with a right upper lobe spiculated nodule and right hilar, mediastinal, and left supraclavicular adenopathy.  She was  diagnosed in November 2022.   Therefore, the patient had guardant 360 performed which showed no actionable mutations.  Her PD-L1 expression is less than 1%.  She was found to have metastatic disease in February 2024  The patient completed a course of concurrent chemoradiation with carboplatin for an AUC of 2 and paclitaxel 45 mg per metered square.  She status post 7 cycles.  She had some significant odynophagia and dysphagia from treatment.   She was seen by Dr. Ardis Hughs while admitted to the hospital and a EGD was performed which showed single 1cm oval shaped very shallow ulceration.  She recovered.  She then had consolidation immunotherapy with Imfinzi 1500 mg IV every 4 weeks.  She is status post 3 cycles.  She developed suspicious immunotherapy mediated pneumonitis and this was ultimately discontinued.  She showed evidence of disease recurrence with enlarged lower right cervical lymph nodes and mediastinal lymphadenopathy.   The patient completed a course of concurrent chemoradiation with carboplatin for an AUC of 2 and paclitaxel 45 mg per metered square.  She status post 7 cycles.  She had some significant odynophagia and dysphagia from treatment.  She was seen by Dr. Ardis Hughs while admitted to the hospital and a EGD was performed which showed single 1cm oval shaped very shallow ulceration   More recently, she did repeat CT scan the neck that showed worsening lymphadenopathy in the neck and mediastinum concerning for disease recurrence.  Therefore she had a PET scan to further evaluate this.  Dr. Julien Nordmann personally and independently reviewed the patient's PET scan discussed the results with the patient today.  Unfortunately, it appears that the patient has metastatic disease with bilateral supraclavicular lymphadenopathy, thoracic adenopathy, precarinal adenopathy, upper abdominal adenopathy, and a small liver lesion.  Dr. Julien Nordmann had a lengthly discussion with the patient today about her current  condition and treatment options.  Dr. Julien Nordmann recommends a ultrasound guided core biopsy of one of the supraclavicular lymph nodes to confirm that this is still non-small cell lung cancer, adenocarcinoma that there is no secondary malignancy or transformation of her malignancy.  However, Dr. Julien Nordmann will now wait for the results before starting treatment.  Dr. Julien Nordmann discussed that her condition is treatable but not curable.  Treatment options were discussed and Dr. Julien Nordmann recommends systemic chemotherapy with carboplatin for an AUC of 5, Alimta 500 mg/m, and Keytruda 200 mg IV every 3 weeks.    Of note, the patient does have a history of intolerance to immunotherapy in the past with Imfinzi.  Therefore we will monitor her closely.  The  patient is interested in proceeding with systemic chemotherapy.  She is expected to start her first dose of this treatment on 04/05/22.  We discussed the adverse side effects of treatment including but not limited to alopecia, myelosuppression, nausea and vomiting, peripheral neuropathy, liver or renal dysfunction as well as immunotherapy mediated adverse effects.   We will arrange for the patient to have a B12 injection while in the clinic today.     I sent prescriptions for 1 mg folic acid p.o. daily as well as Compazine 10 mg every 6 hours as needed for nausea.   The patient will follow-up in 2 weeks for a one-week follow-up visit after completing her first cycle of chemotherapy.  I will order a brain MRI to complete the re-staging workup  The patient and I had a discussion about possible Port-A-Cath.  Dr. Julien Nordmann recommended a Port-A-Cath.  The patient reports she had not had any trouble in the past with Ivs.  She will think about a Port-A-Cath for now and let us know if she would be interested in one.  For now, she would like to try chemotherapy intravenously.  The patient was advised to call immediately if she has any concerning symptoms in the interval. The  patient voices understanding of current disease status and treatment options and is in agreement with the current care plan. All questions were answered. The patient knows to call the clinic with any problems, questions or concerns. We can certainly see the patient much sooner if necessary  Orders Placed This Encounter  Procedures   MR Brain W Wo Contrast    Standing Status:   Future    Standing Expiration Date:   03/29/2023    Order Specific Question:   If indicated for the ordered procedure, I authorize the administration of contrast media per Radiology protocol    Answer:   Yes    Order Specific Question:   What is the patient's sedation requirement?    Answer:   No Sedation    Order Specific Question:   Does the patient have a pacemaker or implanted devices?    Answer:   No    Order Specific Question:   Use SRS Protocol?    Answer:   No    Order Specific Question:   Preferred imaging location?    Answer:   [facility] (table limit - 550 lbs)   [place] CORE BIOPSY (LYMPH NODES)    Standing Status:   Future    Standing Expiration Date:   03/30/2023    Order Specific Question:   Lab orders requested (DO NOT place separate lab orders, these will be automatically ordered during procedure specimen collection):    Answer:   Surgical Pathology    Order Specific Question:   Reason for Exam (SYMPTOM  OR DIAGNOSIS REQUIRED)    Answer:   Recurrent lung cancer. want to confirm same type of cancer to help with treatment    Order Specific Question:   Preferred location?    Answer:   [facility]   CBC with Differential ([place] Only)    Standing Status:   Future    Standing Expiration Date:   04/06/2023   CMP ([place] only)    Standing Status:   Future    Standing Expiration Date:   04/06/2023   T4    Standing Status:   Future    Standing Expiration Date:   04/06/2023   TSH    Standing Status:  Future    Standing Expiration Date:   04/06/2023   CBC with Differential (Cancer  Center Only)    Standing Status:   Future    Standing Expiration Date:   04/27/2023   CMP ([place] only)    Standing Status:   Future    Standing Expiration Date:   04/27/2023   CBC with Differential ([place] Only)    Standing Status:   Future    Standing Expiration Date:   05/18/2023   CMP ([place] only)    Standing Status:   Future    Standing Expiration Date:   05/18/2023   T4    Standing Status:   Future    Standing Expiration Date:   05/18/2023   TSH    Standing Status:   Future    Standing Expiration Date:   05/18/2023   CBC with Differential (Cancer Center Only)    Standing Status:   Future    Standing Expiration Date:   06/08/2023   CMP ([place] only)    Standing Status:   Future    Standing Expiration Date:   06/08/2023   CBC with Differential ([place] Only)    Standing Status:   Future    Standing Expiration Date:   06/29/2023   CMP ([place] only)    Standing Status:   Future    Standing Expiration Date:   06/29/2023   CBC with Differential (Cancer Center Only)    Standing Status:   Future    Standing Expiration Date:   07/20/2023   CMP ([place] only)    Standing Status:   Future    Standing Expiration Date:   07/20/2023   T4    Standing Status:   Future    Standing Expiration Date:   07/20/2023   TSH    Standing Status:   Future    Standing Expiration Date:   07/20/2023   CBC with Differential (Cancer Center Only)    Standing Status:   Standing    Number of Occurrences:   6    Standing Expiration Date:   03/30/2023   CMP ([place] only)    Standing Status:   Standing    Number of Occurrences:   6    Standing Expiration Date:   03/30/2023      Tobe Sos Arris Meyn, PA-C 03/29/22  ADDENDUM: Hematology/Oncology Attending: I had a face-to-face encounter with the patient today.  I reviewed her records, lab, scan and recommended her care plan.  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  The patient has been in observation but she has evidence for disease progression in the right supraclavicular area in August 2023 and she was treated with palliative radiotherapy to this area. In February 2024 the patient again presented with suspicious worsening neck lymphadenopathy and CT scan of the neck and chest showed the suspicious disease progression in the supraclavicular and thoracic lymphadenopathy.  The patient had a PET scan performed recently.  I personally and independently reviewed the PET scan images and discussed results with the patient and her daughter.  Unfortunately her PET scan showed the hypermetabolic supraclavicular as well as thoracic adenopathy in addition  to new precarinal lymph node and upper abdominal lymphadenopathy and suspicious liver metastasis. I had a lengthy discussion with the patient and her daughter about her current condition and treatment options. I recommended for the patient to have repeat ultrasound-guided core biopsy of one of the hypermetabolic lymph nodes for confirmation of tissue diagnosis.  If this is not conclusive, we will consider her for either bronchoscopy with EBUS and biopsy of the mediastinal lymph node or ultrasound-guided core biopsy of one of the liver lesion. In the meantime I will start the patient on systemic chemotherapy with carboplatin for AUC of 5, Alimta 500 Mg/M2 and Keytruda 200 Mg IV every 3 weeks with close monitoring for any possible pneumonitis. We discussed with the patient the adverse effect of this treatment including but not limited to alopecia, myelosuppression, nausea and vomiting,  peripheral neuropathy, liver or renal dysfunction. She will receive vitamin B12 injection today and will start the patient on folic acid and Compazine for nausea. She is expected to start the first cycle of this treatment next week. The patient was advised to call immediately if she has any other concerning symptoms in the interval. The total time spent in the appointment was 30 minutes. Disclaimer: This note was dictated with voice recognition software. Similar sounding words can inadvertently be transcribed and may be missed upon review. Eilleen Kempf, MD

## 2022-03-29 ENCOUNTER — Telehealth: Payer: Self-pay | Admitting: Medical Oncology

## 2022-03-29 ENCOUNTER — Other Ambulatory Visit: Payer: Self-pay

## 2022-03-29 ENCOUNTER — Inpatient Hospital Stay: Payer: Commercial Managed Care - PPO

## 2022-03-29 ENCOUNTER — Inpatient Hospital Stay (HOSPITAL_BASED_OUTPATIENT_CLINIC_OR_DEPARTMENT_OTHER): Payer: Commercial Managed Care - PPO | Admitting: Physician Assistant

## 2022-03-29 VITALS — BP 112/64 | HR 118 | Temp 97.8°F | Resp 16 | Ht 62.0 in | Wt 112.1 lb

## 2022-03-29 DIAGNOSIS — Z7189 Other specified counseling: Secondary | ICD-10-CM

## 2022-03-29 DIAGNOSIS — C3491 Malignant neoplasm of unspecified part of right bronchus or lung: Secondary | ICD-10-CM

## 2022-03-29 DIAGNOSIS — C3411 Malignant neoplasm of upper lobe, right bronchus or lung: Secondary | ICD-10-CM

## 2022-03-29 MED ORDER — FOLIC ACID 1 MG PO TABS: 1.0000 mg | ORAL_TABLET | Freq: Every day | ORAL | 2 refills | Status: DC

## 2022-03-29 MED ORDER — PROCHLORPERAZINE MALEATE 10 MG PO TABS: 10.0000 mg | ORAL_TABLET | Freq: Four times a day (QID) | ORAL | 2 refills | Status: DC | PRN

## 2022-03-29 MED ORDER — CYANOCOBALAMIN 1000 MCG/ML IJ SOLN
1000.0000 ug | Freq: Once | INTRAMUSCULAR | Status: AC
  Administered 2022-03-29: 1000 ug via INTRAMUSCULAR
  Filled 2022-03-29: qty 1

## 2022-03-29 NOTE — Patient Instructions (Addendum)
Summary:  -There are two main categories of lung cancer, they are named based on the size of the cancer cell. One is called Non-Small cell lung cancer. The other type is Small Cell Lung Cancer -The sample (biopsy) that they took of your tumor was consistent with a subtype of Non-small cell lung cancer called Adenocarcinoma. This is the most common type of lung cancer.  -We covered a lot of important information at your appointment today regarding what the treatment plan is moving forward. Here are the the main points that were discussed at your office visit with [place] today:  -The treatment that you will receive consists of two chemotherapy drugs, called Carboplatin and Alimta (also called Pemetrexed) and one immunotherapy drug called Keytruda (pembrolizumab).  -We are planning on starting your treatment next week on 04/05/22 but before your start your treatment, I would like you to attend a Chemotherapy Education Class. This involves having you sit down with one of our nurse educators. She will discuss with your one-on-one more details about your treatment as well as general information about resources here at the cancer center.  -Your treatment will be given once every 3 weeks. We will check your labs once a week for the first ~5 treatments just to make sure that important components of your blood are in an acceptable range -We will get a CT scan after 3 treatments to check on the progress of treatment  Medications:  -I have sent a few important medication prescriptions to your pharmacy.  -Compazine was sent to your pharmacy. This medication is for nausea. You may take this every 6 hours as needed if you feel nauseous.  -I have also sent a prescription for 1 mg of folic acid to your pharmacy. We need you to take 1 tablet every day.  -We will administer vitamin B12 every 9 weeks while you are here in the clinic. You have received your first dose today.   Referrals or Imaging: -We will arrange for  re-biopsy lymph node in the neck to make sure this is not a different type of cancer -We will arrange for a restaging brain MRI.   Follow up:  -We will see you back for a follow up visit 1 week after your first treatment to see how it went and help manage any side effects of treatment that you may have   -If you need to reach [place] at any time, the main office number to the cancer center is [phone], when you call, ask to speak to either Cassie's or Dr. Worthy Flank nurse.

## 2022-03-29 NOTE — Progress Notes (Signed)
DISCONTINUE ON PATHWAY REGIMEN - Non-Small Cell Lung     A cycle is every 28 days:     Durvalumab   **Always confirm dose/schedule in your pharmacy ordering system**  REASON: Disease Progression PRIOR TREATMENT: [id number]: Durvalumab 1,500 mg q28 Days x up to 12 Months TREATMENT RESPONSE: Stable Disease (SD)  START ON PATHWAY REGIMEN - Non-Small Cell Lung     A cycle is every 21 days:     Pembrolizumab      Pemetrexed      Carboplatin   **Always confirm dose/schedule in your pharmacy ordering system**  Patient Characteristics: Stage IV Metastatic, Nonsquamous, Molecular Analysis Completed, Molecular Alteration Present and Targeted Therapy Exhausted OR EGFR Exon 20+ or KRAS G12C+ or HER2+ Present and No Prior Chemo/Immunotherapy OR No Alteration Present, Initial  Chemotherapy/Immunotherapy, PS = 0, 1, No Alteration Present, No Alteration Present, Candidate for Immunotherapy, PD-L1 Expression Positive 1-49% (TPS) / Negative / Not Tested / Awaiting Test Results and Immunotherapy Candidate Therapeutic Status: Stage IV Metastatic Histology: Nonsquamous Cell Broad Molecular Profiling Status: Molecular Analysis Completed Molecular Analysis Results: No Alteration Present ECOG Performance Status: 1 Chemotherapy/Immunotherapy Line of Therapy: Initial Chemotherapy/Immunotherapy EGFR Exons 18-21 Mutation Testing Status: Completed and Negative ALK Fusion/Rearrangement Testing Status: Completed and Negative BRAF V600 Mutation Testing Status: Completed and Negative KRAS G12C Mutation Testing Status: Completed and Negative MET Exon 14 Mutation Testing Status: Completed and Negative RET Fusion/Rearrangement Testing Status: Completed and Negative HER2 Mutation Testing Status: Completed and Negative NTRK Fusion/Rearrangement Testing Status: Completed and Negative ROS1 Fusion/Rearrangement Testing Status: Completed and Negative Immunotherapy Candidate Status: Candidate for Immunotherapy PD-L1  Expression Status: PD-L1 Negative Intent of Therapy: Non-Curative / Palliative Intent, Discussed with Patient 

## 2022-03-29 NOTE — Telephone Encounter (Signed)
Concerned that she does not have a treatment schedule.I told pt that a  scheduler will call  her in the next 24-48 hours.

## 2022-03-29 NOTE — Progress Notes (Signed)
Sandi Mariscal, MD  Donita Brooks D OK for US guided L supraclavicular LN Bx.  PET CT - 03/26/22 - image  54, series 2.  Sedation per pt request.  Cathren Harsh

## 2022-03-31 ENCOUNTER — Other Ambulatory Visit: Payer: Self-pay

## 2022-04-02 MED FILL — Fosaprepitant Dimeglumine For IV Infusion 150 MG (Base Eq): INTRAVENOUS | Qty: 5 | Status: AC

## 2022-04-02 MED FILL — Dexamethasone Sodium Phosphate Inj 100 MG/10ML: INTRAMUSCULAR | Qty: 1 | Status: AC

## 2022-04-05 ENCOUNTER — Ambulatory Visit (HOSPITAL_COMMUNITY): Payer: Commercial Managed Care - PPO

## 2022-04-05 ENCOUNTER — Telehealth: Payer: Self-pay | Admitting: *Deleted

## 2022-04-05 ENCOUNTER — Inpatient Hospital Stay: Payer: Commercial Managed Care - PPO

## 2022-04-05 ENCOUNTER — Ambulatory Visit (HOSPITAL_COMMUNITY)
Admission: RE | Admit: 2022-04-05 | Discharge: 2022-04-05 | Disposition: A | Discharge status: Home / Self Care | Payer: Commercial Managed Care - PPO | Source: Ambulatory Visit | Attending: Physician Assistant | Admitting: Physician Assistant

## 2022-04-05 ENCOUNTER — Inpatient Hospital Stay: Payer: Commercial Managed Care - PPO | Attending: Internal Medicine

## 2022-04-05 ENCOUNTER — Other Ambulatory Visit: Payer: Self-pay

## 2022-04-05 ENCOUNTER — Encounter (HOSPITAL_COMMUNITY): Payer: Self-pay | Admitting: Radiology

## 2022-04-05 VITALS — BP 113/70 | HR 96 | Temp 98.2°F | Resp 18 | Wt 113.0 lb

## 2022-04-05 DIAGNOSIS — C3491 Malignant neoplasm of unspecified part of right bronchus or lung: Secondary | ICD-10-CM

## 2022-04-05 DIAGNOSIS — Z5112 Encounter for antineoplastic immunotherapy: Secondary | ICD-10-CM | POA: Insufficient documentation

## 2022-04-05 DIAGNOSIS — Z5111 Encounter for antineoplastic chemotherapy: Secondary | ICD-10-CM | POA: Diagnosis present

## 2022-04-05 DIAGNOSIS — C770 Secondary and unspecified malignant neoplasm of lymph nodes of head, face and neck: Secondary | ICD-10-CM | POA: Diagnosis not present

## 2022-04-05 DIAGNOSIS — Z79620 Long term (current) use of immunosuppressive biologic: Secondary | ICD-10-CM | POA: Insufficient documentation

## 2022-04-05 DIAGNOSIS — R21 Rash and other nonspecific skin eruption: Secondary | ICD-10-CM | POA: Insufficient documentation

## 2022-04-05 DIAGNOSIS — R1310 Dysphagia, unspecified: Secondary | ICD-10-CM | POA: Diagnosis not present

## 2022-04-05 DIAGNOSIS — Z5189 Encounter for other specified aftercare: Secondary | ICD-10-CM | POA: Diagnosis not present

## 2022-04-05 DIAGNOSIS — C3411 Malignant neoplasm of upper lobe, right bronchus or lung: Secondary | ICD-10-CM | POA: Insufficient documentation

## 2022-04-05 DIAGNOSIS — D61810 Antineoplastic chemotherapy induced pancytopenia: Secondary | ICD-10-CM | POA: Insufficient documentation

## 2022-04-05 DIAGNOSIS — K5900 Constipation, unspecified: Secondary | ICD-10-CM | POA: Insufficient documentation

## 2022-04-05 LAB — CBC WITH DIFFERENTIAL (CANCER CENTER ONLY)
Abs Immature Granulocytes: 0.01 10*3/uL (ref 0.00–0.07)
Basophils Absolute: 0 10*3/uL (ref 0.0–0.1)
Basophils Relative: 0 %
Eosinophils Absolute: 0.1 10*3/uL (ref 0.0–0.5)
Eosinophils Relative: 2 %
HCT: 35.9 % — ABNORMAL LOW (ref 36.0–46.0)
Hemoglobin: 12.4 g/dL (ref 12.0–15.0)
Immature Granulocytes: 0 %
Lymphocytes Relative: 14 %
Lymphs Abs: 0.6 10*3/uL — ABNORMAL LOW (ref 0.7–4.0)
MCH: 33.1 pg (ref 26.0–34.0)
MCHC: 34.5 g/dL (ref 30.0–36.0)
MCV: 95.7 fL (ref 80.0–100.0)
Monocytes Absolute: 0.2 10*3/uL (ref 0.1–1.0)
Monocytes Relative: 6 %
Neutro Abs: 3.1 10*3/uL (ref 1.7–7.7)
Neutrophils Relative %: 78 %
Platelet Count: 283 10*3/uL (ref 150–400)
RBC: 3.75 MIL/uL — ABNORMAL LOW (ref 3.87–5.11)
RDW: 11.9 % (ref 11.5–15.5)
WBC Count: 4 10*3/uL (ref 4.0–10.5)
nRBC: 0 % (ref 0.0–0.2)

## 2022-04-05 LAB — CMP (CANCER CENTER ONLY)
ALT: 15 U/L (ref 0–44)
AST: 15 U/L (ref 15–41)
Albumin: 3.9 g/dL (ref 3.5–5.0)
Alkaline Phosphatase: 108 U/L (ref 38–126)
Anion gap: 8 (ref 5–15)
BUN: 16 mg/dL (ref 6–20)
CO2: 27 mmol/L (ref 22–32)
Calcium: 9.9 mg/dL (ref 8.9–10.3)
Chloride: 105 mmol/L (ref 98–111)
Creatinine: 0.82 mg/dL (ref 0.44–1.00)
GFR, Estimated: >60 mL/min (ref >60)
Glucose, Bld: 110 mg/dL — ABNORMAL HIGH (ref 70–99)
Potassium: 3.7 mmol/L (ref 3.5–5.1)
Sodium: 140 mmol/L (ref 135–145)
Total Bilirubin: 0.4 mg/dL (ref 0.3–1.2)
Total Protein: 7.3 g/dL (ref 6.5–8.1)

## 2022-04-05 LAB — TSH: TSH: 1.901 u[IU]/mL (ref 0.350–4.500)

## 2022-04-05 MED ORDER — SODIUM CHLORIDE 0.9 % IV SOLN
10.0000 mg | Freq: Once | INTRAVENOUS | Status: AC
  Administered 2022-04-05: 10 mg via INTRAVENOUS
  Filled 2022-04-05: qty 10

## 2022-04-05 MED ORDER — SODIUM CHLORIDE 0.9 % IV SOLN
200.0000 mg | Freq: Once | INTRAVENOUS | Status: AC
  Administered 2022-04-05: 200 mg via INTRAVENOUS
  Filled 2022-04-05: qty 8

## 2022-04-05 MED ORDER — SODIUM CHLORIDE 0.9% FLUSH: 10.0000 mL | INTRAVENOUS | Status: DC | PRN

## 2022-04-05 MED ORDER — PALONOSETRON HCL INJECTION 0.25 MG/5ML
0.2500 mg | Freq: Once | INTRAVENOUS | Status: AC
  Administered 2022-04-05: 0.25 mg via INTRAVENOUS
  Filled 2022-04-05: qty 5

## 2022-04-05 MED ORDER — HEPARIN SOD (PORK) LOCK FLUSH 100 UNIT/ML IV SOLN: 500.0000 [IU] | Freq: Once | INTRAVENOUS | Status: DC | PRN

## 2022-04-05 MED ORDER — GADOBUTROL 1 MMOL/ML IV SOLN
5.0000 mL | Freq: Once | INTRAVENOUS | Status: AC | PRN
  Administered 2022-04-05: 5 mL via INTRAVENOUS

## 2022-04-05 MED ORDER — SODIUM CHLORIDE 0.9 % IV SOLN: Freq: Once | INTRAVENOUS | Status: AC

## 2022-04-05 MED ORDER — SODIUM CHLORIDE 0.9 % IV SOLN
432.5000 mg | Freq: Once | INTRAVENOUS | Status: AC
  Administered 2022-04-05: 450 mg via INTRAVENOUS
  Filled 2022-04-05: qty 45

## 2022-04-05 MED ORDER — SODIUM CHLORIDE 0.9 % IV SOLN
500.0000 mg/m2 | Freq: Once | INTRAVENOUS | Status: AC
  Administered 2022-04-05: 700 mg via INTRAVENOUS
  Filled 2022-04-05: qty 20

## 2022-04-05 MED ORDER — SODIUM CHLORIDE 0.9 % IV SOLN
150.0000 mg | Freq: Once | INTRAVENOUS | Status: AC
  Administered 2022-04-05: 150 mg via INTRAVENOUS
  Filled 2022-04-05: qty 150

## 2022-04-05 NOTE — Patient Instructions (Signed)
[facility]  Discharge Instructions: Thank you for choosing [facility] to provide your oncology and hematology care.   If you have a lab appointment with the [facility], please go directly to the [facility] and check in at the registration area.   Wear comfortable clothing and clothing appropriate for easy access to any Portacath or PICC line.   We strive to give you quality time with your provider. You may need to reschedule your appointment if you arrive late (15 or more minutes).  Arriving late affects you and other patients whose appointments are after yours.  Also, if you miss three or more appointments without notifying the office, you may be dismissed from the clinic at the provider's discretion.      For prescription refill requests, have your pharmacy contact our office and allow 72 hours for refills to be completed.    Today you received the following chemotherapy and/or immunotherapy agents: Keytruda, Alimta, Carboplatin.       To help prevent nausea and vomiting after your treatment, we encourage you to take your nausea medication as directed.  BELOW ARE SYMPTOMS THAT SHOULD BE REPORTED IMMEDIATELY: *FEVER GREATER THAN 100.4 F (38 C) OR HIGHER *CHILLS OR SWEATING *NAUSEA AND VOMITING THAT IS NOT CONTROLLED WITH YOUR NAUSEA MEDICATION *UNUSUAL SHORTNESS OF BREATH *UNUSUAL BRUISING OR BLEEDING *URINARY PROBLEMS (pain or burning when urinating, or frequent urination) *BOWEL PROBLEMS (unusual diarrhea, constipation, pain near the anus) TENDERNESS IN MOUTH AND THROAT WITH OR WITHOUT PRESENCE OF ULCERS (sore throat, sores in mouth, or a toothache) UNUSUAL RASH, SWELLING OR PAIN  UNUSUAL VAGINAL DISCHARGE OR ITCHING   Items with * indicate a potential emergency and should be followed up as soon as possible or go to the Emergency Department if any problems should occur.  Please show the CHEMOTHERAPY ALERT CARD or  IMMUNOTHERAPY ALERT CARD at check-in to the Emergency Department and triage nurse.  Should you have questions after your visit or need to cancel or reschedule your appointment, please contact [facility]  Dept: [phone]  and follow the prompts.  Office hours are 8:00 a.m. to 4:30 p.m. Monday - Friday. Please note that voicemails left after 4:00 p.m. may not be returned until the following business day.  We are closed weekends and major holidays. You have access to a nurse at all times for urgent questions. Please call the main number to the clinic Dept: [phone] and follow the prompts.   For any non-urgent questions, you may also contact your provider using MyChart. We now offer e-Visits for anyone 57 and older to request care online for non-urgent symptoms. For details visit [email or web address].   Also download the MyChart app! Go to the app store, search "MyChart", open the app, select [facility], and log in with your MyChart username and password.  Pembrolizumab Injection What is this medication? PEMBROLIZUMAB (PEM broe LIZ ue mab) treats some types of cancer. It works by helping your immune system slow or stop the spread of cancer cells. It is a monoclonal antibody. This medicine may be used for other purposes; ask your health care provider or pharmacist if you have questions. COMMON BRAND NAME(S): Keytruda What should I tell my care team before I take this medication? They need to know if you have any of these conditions: Allogeneic stem cell transplant (uses someone else's stem cells) Autoimmune diseases, such as Crohn disease, ulcerative colitis, lupus History of  chest radiation Nervous system problems, such as Guillain-Barre syndrome, myasthenia gravis Organ transplant An unusual or allergic reaction to pembrolizumab, other medications, foods, dyes, or preservatives Pregnant or trying to get pregnant Breast-feeding How should I use  this medication? This medication is injected into a vein. It is given by your care team in a hospital or clinic setting. A special MedGuide will be given to you before each treatment. Be sure to read this information carefully each time. Talk to your care team about the use of this medication in children. While it may be prescribed for children as young as 6 months for selected conditions, precautions do apply. Overdosage: If you think you have taken too much of this medicine contact a poison control center or emergency room at once. NOTE: This medicine is only for you. Do not share this medicine with others. What if I miss a dose? Keep appointments for follow-up doses. It is important not to miss your dose. Call your care team if you are unable to keep an appointment. What may interact with this medication? Interactions have not been studied. This list may not describe all possible interactions. Give your health care provider a list of all the medicines, herbs, non-prescription drugs, or dietary supplements you use. Also tell them if you smoke, drink alcohol, or use illegal drugs. Some items may interact with your medicine. What should I watch for while using this medication? Your condition will be monitored carefully while you are receiving this medication. You may need blood work while taking this medication. This medication may cause serious skin reactions. They can happen weeks to months after starting the medication. Contact your care team right away if you notice fevers or flu-like symptoms with a rash. The rash may be red or purple and then turn into blisters or peeling of the skin. You may also notice a red rash with swelling of the face, lips, or lymph nodes in your neck or under your arms. Tell your care team right away if you have any change in your eyesight. Talk to your care team if you may be pregnant. Serious birth defects can occur if you take this medication during pregnancy and for  4 months after the last dose. You will need a negative pregnancy test before starting this medication. Contraception is recommended while taking this medication and for 4 months after the last dose. Your care team can help you find the option that works for you. Do not breastfeed while taking this medication and for 4 months after the last dose. What side effects may I notice from receiving this medication? Side effects that you should report to your care team as soon as possible: Allergic reactions--skin rash, itching, hives, swelling of the face, lips, tongue, or throat Dry cough, shortness of breath or trouble breathing Eye pain, redness, irritation, or discharge with blurry or decreased vision Heart muscle inflammation--unusual weakness or fatigue, shortness of breath, chest pain, fast or irregular heartbeat, dizziness, swelling of the ankles, feet, or hands Hormone gland problems--headache, sensitivity to light, unusual weakness or fatigue, dizziness, fast or irregular heartbeat, increased sensitivity to cold or heat, excessive sweating, constipation, hair loss, increased thirst or amount of urine, tremors or shaking, irritability Infusion reactions--chest pain, shortness of breath or trouble breathing, feeling faint or lightheaded Kidney injury (glomerulonephritis)--decrease in the amount of urine, red or dark brown urine, foamy or bubbly urine, swelling of the ankles, hands, or feet Liver injury--right upper belly pain, loss of appetite, nausea, light-colored stool,  dark yellow or brown urine, yellowing skin or eyes, unusual weakness or fatigue Pain, tingling, or numbness in the hands or feet, muscle weakness, change in vision, confusion or trouble speaking, loss of balance or coordination, trouble walking, seizures Rash, fever, and swollen lymph nodes Redness, blistering, peeling, or loosening of the skin, including inside the mouth Sudden or severe stomach pain, bloody diarrhea, fever,  nausea, vomiting Side effects that usually do not require medical attention (report to your care team if they continue or are bothersome): Bone, joint, or muscle pain Diarrhea Fatigue Loss of appetite Nausea Skin rash This list may not describe all possible side effects. Call your doctor for medical advice about side effects. You may report side effects to FDA at 1-800-FDA-1088. Where should I keep my medication? This medication is given in a hospital or clinic. It will not be stored at home. NOTE: This sheet is a summary. It may not cover all possible information. If you have questions about this medicine, talk to your doctor, pharmacist, or health care provider.  2023 Elsevier/Gold Standard (2021-06-02 00:00:00)  Pemetrexed Injection What is this medication? PEMETREXED (PEM e TREX ed) treats some types of cancer. It works by slowing down the growth of cancer cells. This medicine may be used for other purposes; ask your health care provider or pharmacist if you have questions. COMMON BRAND NAME(S): Alimta, PEMFEXY What should I tell my care team before I take this medication? They need to know if you have any of these conditions: Infection, such as chickenpox, cold sores, or herpes Kidney disease Low blood cell levels (white cells, red cells, and platelets) Lung or breathing disease, such as asthma Radiation therapy An unusual or allergic reaction to pemetrexed, other medications, foods, dyes, or preservatives If you or your partner are pregnant or trying to get pregnant Breast-feeding How should I use this medication? This medication is injected into a vein. It is given by your care team in a hospital or clinic setting. Talk to your care team about the use of this medication in children. Special care may be needed. Overdosage: If you think you have taken too much of this medicine contact a poison control center or emergency room at once. NOTE: This medicine is only for you. Do not  share this medicine with others. What if I miss a dose? Keep appointments for follow-up doses. It is important not to miss your dose. Call your care team if you are unable to keep an appointment. What may interact with this medication? Do not take this medication with any of the following: Live virus vaccines This medication may also interact with the following: Ibuprofen This list may not describe all possible interactions. Give your health care provider a list of all the medicines, herbs, non-prescription drugs, or dietary supplements you use. Also tell them if you smoke, drink alcohol, or use illegal drugs. Some items may interact with your medicine. What should I watch for while using this medication? Your condition will be monitored carefully while you are receiving this medication. This medication may make you feel generally unwell. This is not uncommon as chemotherapy can affect healthy cells as well as cancer cells. Report any side effects. Continue your course of treatment even though you feel ill unless your care team tells you to stop. This medication can cause serious side effects. To reduce the risk, your care team may give you other medications to take before receiving this one. Be sure to follow the directions from your care  team. This medication can cause a rash or redness in areas of the body that have previously had radiation therapy. If you have had radiation therapy, tell your care team if you notice a rash in this area. This medication may increase your risk of getting an infection. Call your care team for advice if you get a fever, chills, sore throat, or other symptoms of a cold or flu. Do not treat yourself. Try to avoid being around people who are sick. Be careful brushing or flossing your teeth or using a toothpick because you may get an infection or bleed more easily. If you have any dental work done, tell your dentist you are receiving this medication. Avoid taking  medications that contain aspirin, acetaminophen, ibuprofen, naproxen, or ketoprofen unless instructed by your care team. These medications may hide a fever. Check with your care team if you have severe diarrhea, nausea, and vomiting, or if you sweat a lot. The loss of too much body fluid may make it dangerous for you to take this medication. Talk to your care team if you or your partner wish to become pregnant or think either of you might be pregnant. This medication can cause serious birth defects if taken during pregnancy and for 6 months after the last dose. A negative pregnancy test is required before starting this medication. A reliable form of contraception is recommended while taking this medication and for 6 months after the last dose. Talk to your care team about reliable forms of contraception. Do not father a child while taking this medication and for 3 months after the last dose. Use a condom while having sex during this time period. Do not breastfeed while taking this medication and for 1 week after the last dose. This medication may cause infertility. Talk to your care team if you are concerned about your fertility. What side effects may I notice from receiving this medication? Side effects that you should report to your care team as soon as possible: Allergic reactions--skin rash, itching, hives, swelling of the face, lips, tongue, or throat Dry cough, shortness of breath or trouble breathing Infection--fever, chills, cough, sore throat, wounds that don't heal, pain or trouble when passing urine, general feeling of discomfort or being unwell Kidney injury--decrease in the amount of urine, swelling of the ankles, hands, or feet Low red blood cell level--unusual weakness or fatigue, dizziness, headache, trouble breathing Redness, blistering, peeling, or loosening of the skin, including inside the mouth Unusual bruising or bleeding Side effects that usually do not require medical attention  (report to your care team if they continue or are bothersome): Fatigue Loss of appetite Nausea Vomiting This list may not describe all possible side effects. Call your doctor for medical advice about side effects. You may report side effects to FDA at 1-800-FDA-1088. Where should I keep my medication? This medication is given in a hospital or clinic. It will not be stored at home. NOTE: This sheet is a summary. It may not cover all possible information. If you have questions about this medicine, talk to your doctor, pharmacist, or health care provider.  2023 Elsevier/Gold Standard (2021-05-25 00:00:00)  Carboplatin Injection What is this medication? CARBOPLATIN (KAR boe pla tin) treats some types of cancer. It works by slowing down the growth of cancer cells. This medicine may be used for other purposes; ask your health care provider or pharmacist if you have questions. COMMON BRAND NAME(S): Paraplatin What should I tell my care team before I take this medication?  They need to know if you have any of these conditions: Blood disorders Hearing problems Kidney disease Recent or ongoing radiation therapy An unusual or allergic reaction to carboplatin, cisplatin, other medications, foods, dyes, or preservatives Pregnant or trying to get pregnant Breast-feeding How should I use this medication? This medication is injected into a vein. It is given by your care team in a hospital or clinic setting. Talk to your care team about the use of this medication in children. Special care may be needed. Overdosage: If you think you have taken too much of this medicine contact a poison control center or emergency room at once. NOTE: This medicine is only for you. Do not share this medicine with others. What if I miss a dose? Keep appointments for follow-up doses. It is important not to miss your dose. Call your care team if you are unable to keep an appointment. What may interact with this  medication? Medications for seizures Some antibiotics, such as amikacin, gentamicin, neomycin, streptomycin, tobramycin Vaccines This list may not describe all possible interactions. Give your health care provider a list of all the medicines, herbs, non-prescription drugs, or dietary supplements you use. Also tell them if you smoke, drink alcohol, or use illegal drugs. Some items may interact with your medicine. What should I watch for while using this medication? Your condition will be monitored carefully while you are receiving this medication. You may need blood work while taking this medication. This medication may make you feel generally unwell. This is not uncommon, as chemotherapy can affect healthy cells as well as cancer cells. Report any side effects. Continue your course of treatment even though you feel ill unless your care team tells you to stop. In some cases, you may be given additional medications to help with side effects. Follow all directions for their use. This medication may increase your risk of getting an infection. Call your care team for advice if you get a fever, chills, sore throat, or other symptoms of a cold or flu. Do not treat yourself. Try to avoid being around people who are sick. Avoid taking medications that contain aspirin, acetaminophen, ibuprofen, naproxen, or ketoprofen unless instructed by your care team. These medications may hide a fever. Be careful brushing or flossing your teeth or using a toothpick because you may get an infection or bleed more easily. If you have any dental work done, tell your dentist you are receiving this medication. Talk to your care team if you wish to become pregnant or think you might be pregnant. This medication can cause serious birth defects. Talk to your care team about effective forms of contraception. Do not breast-feed while taking this medication. What side effects may I notice from receiving this medication? Side effects  that you should report to your care team as soon as possible: Allergic reactions--skin rash, itching, hives, swelling of the face, lips, tongue, or throat Infection--fever, chills, cough, sore throat, wounds that don't heal, pain or trouble when passing urine, general feeling of discomfort or being unwell Low red blood cell level--unusual weakness or fatigue, dizziness, headache, trouble breathing Pain, tingling, or numbness in the hands or feet, muscle weakness, change in vision, confusion or trouble speaking, loss of balance or coordination, trouble walking, seizures Unusual bruising or bleeding Side effects that usually do not require medical attention (report to your care team if they continue or are bothersome): Hair loss Nausea Unusual weakness or fatigue Vomiting This list may not describe all possible side effects. Call  your doctor for medical advice about side effects. You may report side effects to FDA at 1-800-FDA-1088. Where should I keep my medication? This medication is given in a hospital or clinic. It will not be stored at home. NOTE: This sheet is a summary. It may not cover all possible information. If you have questions about this medicine, talk to your doctor, pharmacist, or health care provider.  2023 Elsevier/Gold Standard (2007-03-11 00:00:00)

## 2022-04-05 NOTE — Telephone Encounter (Signed)
Connected with SUR[facility] FIDALGO, [phone] (home) regarding [facility] Accommodation request. "[employer] knows I am on SSA-Disability for the past 3-4 months.  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  When I bend over I am short of breath.  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

## 2022-04-06 ENCOUNTER — Telehealth: Payer: Self-pay | Admitting: Physician Assistant

## 2022-04-06 ENCOUNTER — Telehealth: Payer: Self-pay | Admitting: *Deleted

## 2022-04-06 NOTE — Telephone Encounter (Signed)
Called pt to see how she did with her recent chemo.  She reports doing well & denies any problems other than constipation which she has had for a good while before treatment.  She takes a stool softener & states that it helps.  She has a laxative that she takes at night that helps too.  She knows her next appt & how to reach [place] if needed.  She is out & about this am.

## 2022-04-06 NOTE — Telephone Encounter (Signed)
Connected with Cloyd Stagers [phone]).  Asked need for [employer] Accommodation claim no.# [id number] or was request sent in error for TANEAL TERRIO, D.O.B. 03-07-1965, SS# [id number].  No accommodation needed.  Patient unable to work.  Currently receiving SSA-Disability.  [facility] unable to assist transferred call to [facility] department.  Connected to [facility], provided above.  Robin expressed provider needs to complete and return ADA paperwork to [facility].

## 2022-04-06 NOTE — Telephone Encounter (Signed)
-----   Message from Rafael Bihari, RN sent at 04/05/2022 11:47 AM EST ----- Regarding: Dr Julien Nordmann pt first time Keytruda, Alimta, Carboplatin Dr Julien Nordmann pt came in for first time Keytruda, Alimta, Carboplatin. Tolerated infusions well. Needs call back.

## 2022-04-06 NOTE — Telephone Encounter (Signed)
I called the patient to let her know her brain MRI is negative for metastatic disease to the brain. She mentions she feels well after her first infusion yesterday. She mentioned she has a mild sore throat but is not sure if related to allergies. She is taking an antihistamine. She will monitor her symptoms and call us back if she develops worsening symptoms or new symptoms.

## 2022-04-07 ENCOUNTER — Ambulatory Visit: Payer: Commercial Managed Care - PPO | Admitting: Internal Medicine

## 2022-04-07 LAB — T4: T4, Total: 9.9 ug/dL (ref 4.5–12.0)

## 2022-04-09 ENCOUNTER — Other Ambulatory Visit: Payer: Self-pay | Admitting: Radiology

## 2022-04-09 DIAGNOSIS — C3411 Malignant neoplasm of upper lobe, right bronchus or lung: Secondary | ICD-10-CM

## 2022-04-10 ENCOUNTER — Other Ambulatory Visit: Payer: Self-pay

## 2022-04-10 ENCOUNTER — Emergency Department (HOSPITAL_COMMUNITY): Payer: Commercial Managed Care - PPO

## 2022-04-10 ENCOUNTER — Emergency Department (HOSPITAL_COMMUNITY)
Admission: EM | Admit: 2022-04-10 | Discharge: 2022-04-10 | Disposition: A | Discharge status: Home / Self Care | Payer: Commercial Managed Care - PPO | Attending: Emergency Medicine | Admitting: Emergency Medicine

## 2022-04-10 DIAGNOSIS — Z85118 Personal history of other malignant neoplasm of bronchus and lung: Secondary | ICD-10-CM | POA: Diagnosis not present

## 2022-04-10 DIAGNOSIS — R1084 Generalized abdominal pain: Secondary | ICD-10-CM

## 2022-04-10 DIAGNOSIS — D72819 Decreased white blood cell count, unspecified: Secondary | ICD-10-CM | POA: Insufficient documentation

## 2022-04-10 DIAGNOSIS — R1013 Epigastric pain: Secondary | ICD-10-CM | POA: Insufficient documentation

## 2022-04-10 DIAGNOSIS — Z7982 Long term (current) use of aspirin: Secondary | ICD-10-CM | POA: Diagnosis not present

## 2022-04-10 DIAGNOSIS — R1011 Right upper quadrant pain: Secondary | ICD-10-CM | POA: Insufficient documentation

## 2022-04-10 DIAGNOSIS — R079 Chest pain, unspecified: Secondary | ICD-10-CM | POA: Insufficient documentation

## 2022-04-10 DIAGNOSIS — R112 Nausea with vomiting, unspecified: Secondary | ICD-10-CM | POA: Insufficient documentation

## 2022-04-10 DIAGNOSIS — R1012 Left upper quadrant pain: Secondary | ICD-10-CM | POA: Insufficient documentation

## 2022-04-10 LAB — URINALYSIS, ROUTINE W REFLEX MICROSCOPIC
Bilirubin Urine: NEGATIVE
Glucose, UA: NEGATIVE mg/dL
Ketones, ur: NEGATIVE mg/dL
Nitrite: NEGATIVE
Protein, ur: NEGATIVE mg/dL
Specific Gravity, Urine: 1.024 (ref 1.005–1.030)
pH: 5 (ref 5.0–8.0)

## 2022-04-10 LAB — COMPREHENSIVE METABOLIC PANEL
ALT: 27 U/L (ref 0–44)
AST: 22 U/L (ref 15–41)
Albumin: 4 g/dL (ref 3.5–5.0)
Alkaline Phosphatase: 106 U/L (ref 38–126)
Anion gap: 8 (ref 5–15)
BUN: 15 mg/dL (ref 6–20)
CO2: 25 mmol/L (ref 22–32)
Calcium: 9.3 mg/dL (ref 8.9–10.3)
Chloride: 102 mmol/L (ref 98–111)
Creatinine, Ser: 0.72 mg/dL (ref 0.44–1.00)
GFR, Estimated: >60 mL/min (ref >60)
Glucose, Bld: 122 mg/dL — ABNORMAL HIGH (ref 70–99)
Potassium: 3.6 mmol/L (ref 3.5–5.1)
Sodium: 135 mmol/L (ref 135–145)
Total Bilirubin: 0.7 mg/dL (ref 0.3–1.2)
Total Protein: 8.2 g/dL — ABNORMAL HIGH (ref 6.5–8.1)

## 2022-04-10 LAB — CBC
HCT: 36.6 % (ref 36.0–46.0)
Hemoglobin: 12.6 g/dL (ref 12.0–15.0)
MCH: 33.2 pg (ref 26.0–34.0)
MCHC: 34.4 g/dL (ref 30.0–36.0)
MCV: 96.6 fL (ref 80.0–100.0)
Platelets: 236 10*3/uL (ref 150–400)
RBC: 3.79 MIL/uL — ABNORMAL LOW (ref 3.87–5.11)
RDW: 11.8 % (ref 11.5–15.5)
WBC: 3.5 10*3/uL — ABNORMAL LOW (ref 4.0–10.5)
nRBC: 0 % (ref 0.0–0.2)

## 2022-04-10 LAB — LIPASE, BLOOD: Lipase: 58 U/L — ABNORMAL HIGH (ref 11–51)

## 2022-04-10 MED ORDER — FENTANYL CITRATE PF 50 MCG/ML IJ SOSY
50.0000 ug | PREFILLED_SYRINGE | Freq: Once | INTRAMUSCULAR | Status: AC
  Administered 2022-04-10: 50 ug via INTRAVENOUS
  Filled 2022-04-10: qty 1

## 2022-04-10 MED ORDER — SODIUM CHLORIDE 0.9 % IV BOLUS
1000.0000 mL | Freq: Once | INTRAVENOUS | Status: AC
  Administered 2022-04-10: 1000 mL via INTRAVENOUS

## 2022-04-10 MED ORDER — IOHEXOL 300 MG/ML  SOLN
100.0000 mL | Freq: Once | INTRAMUSCULAR | Status: AC | PRN
  Administered 2022-04-10: 100 mL via INTRAVENOUS

## 2022-04-10 MED ORDER — ONDANSETRON HCL 4 MG/2ML IJ SOLN
4.0000 mg | Freq: Once | INTRAMUSCULAR | Status: AC
  Administered 2022-04-10: 4 mg via INTRAVENOUS
  Filled 2022-04-10: qty 2

## 2022-04-10 NOTE — ED Provider Notes (Signed)
[place] EMERGENCY DEPARTMENT AT [facility] Provider Note   CSN: [id number] Arrival date & time: 04/10/22  1639     History {Add pertinent medical, surgical, social history, OB history to HPI:1} Chief Complaint  Patient presents with   Abdominal Pain    Julie Nicholson is a 57 y.o. female.  She has a history of metastatic lung cancer and follows with Dr. Earlie Server oncology.  She just finished some chemotherapy and is due for another course on Monday.  Is complaining of feeling constipated and bloated, upper abdominal pain radiating through to her back.  Associated with nausea and 1 episode of vomiting.  She tried some laxatives stool softeners and Maalox with minimal output.  She is on chronic narcotics but has been on for a long time and states that does not usually make her constipated.  No rectal bleeding no fevers no chills.  The history is provided by the patient.  Abdominal Pain Pain location:  Epigastric, LUQ and RUQ Pain quality: aching and cramping   Pain radiates to:  Back Pain severity:  Severe Onset quality:  Gradual Duration:  3 days Timing:  Intermittent Progression:  Unchanged Chronicity:  New Context: not trauma   Relieved by:  Nothing Worsened by:  Nothing Ineffective treatments:  Bowel activity Associated symptoms: chest pain, constipation, nausea and vomiting   Associated symptoms: no cough, no diarrhea, no dysuria, no fever, no hematemesis, no hematochezia and no hematuria        Home Medications Prior to Admission medications   Medication Sig Start Date End Date Taking? Authorizing Provider  acetaminophen (TYLENOL) 500 MG tablet Take 1,000 mg by mouth every 6 (six) hours as needed for moderate pain.    [provider]  albuterol (PROVENTIL) (2.5 MG/3ML) 0.083% nebulizer solution albuterol sulfate 2.5 mg/3 mL (0.083 %) solution for nebulization  USE 1 VIAL IN NEBULIZER EVERY 6 HOURS AS NEEDED FOR WHEEZING OR SHORTNESS OF BREATH     [provider]  amoxicillin-clavulanate (AUGMENTIN) 875-125 MG tablet Take 1 tablet by mouth 2 (two) times daily. 11/11/21   Collene Gobble, MD  aspirin EC 81 MG tablet Take 81 mg by mouth at bedtime.    [provider]  atorvastatin (LIPITOR) 40 MG tablet Take 40 mg by mouth at bedtime.    [provider]  benzonatate (TESSALON) 200 MG capsule Take 200 mg by mouth 3 (three) times daily as needed. 10/23/21   [provider]  Biotin 5000 MCG TABS Take 5,000 mcg by mouth at bedtime.    [provider]  buPROPion (WELLBUTRIN XL) 150 MG 24 hr tablet Take 1 tablet by mouth daily.    [provider]  buPROPion (ZYBAN) 150 MG 12 hr tablet TAKE 1 TABLET BY MOUTH TWICE A DAY 02/15/22   Collene Gobble, MD  Ca Carbonate-Mag Hydroxide (ROLAIDS PO) Take 1 tablet by mouth daily as needed (heartburn).    [provider]  fluticasone (FLONASE) 50 MCG/ACT nasal spray Place 2 sprays into both nostrils daily as needed for allergies.    [provider]  folic acid (FOLVITE) 1 MG tablet Take 1 tablet (1 mg total) by mouth daily. 03/29/22   Heilingoetter, Cassandra L, PA-C  loratadine (CLARITIN) 10 MG tablet Take 1 tablet (10 mg total) by mouth daily. 11/26/21   Collene Gobble, MD  Multiple Vitamins-Minerals (HAIR SKIN AND NAILS FORMULA PO) Take 1 tablet by mouth daily.    [provider]  ondansetron (  ZOFRAN-ODT) 8 MG disintegrating tablet Take 8 mg by mouth 3 (three) times daily as needed. 10/23/21   [provider]  Oxycodone HCl 10 MG TABS Take 1 tablet by mouth 3 (three) times daily as needed (pain).    [provider]  prochlorperazine (COMPAZINE) 10 MG tablet Take 1 tablet (10 mg total) by mouth every 6 (six) hours as needed. 03/29/22   Heilingoetter, Cassandra L, PA-C  sertraline (ZOLOFT) 50 MG tablet Take 1 tablet by mouth daily.    [provider]  Tiotropium Bromide-Olodaterol (STIOLTO RESPIMAT) 2.5-2.5  MCG/ACT AERS Inhale 2 puffs into the lungs daily. 11/26/21   Collene Gobble, MD      Allergies    Benadryl [diphenhydramine]    Review of Systems   Review of Systems  Constitutional:  Negative for fever.  Respiratory:  Negative for cough.   Cardiovascular:  Positive for chest pain.  Gastrointestinal:  Positive for abdominal pain, constipation, nausea and vomiting. Negative for diarrhea, hematemesis and hematochezia.  Genitourinary:  Negative for dysuria and hematuria.    Physical Exam Updated Vital Signs BP 113/79 (BP Location: Left Arm)   Pulse (!) 115   Temp 98.1 F (36.7 C) (Oral)   Resp (!) 22   SpO2 99%  Physical Exam Vitals and nursing note reviewed.  Constitutional:      General: She is not in acute distress.    Appearance: Normal appearance. She is well-developed.  HENT:     Head: Normocephalic and atraumatic.  Eyes:     Conjunctiva/sclera: Conjunctivae normal.  Cardiovascular:     Rate and Rhythm: Normal rate and regular rhythm.     Heart sounds: No murmur heard. Pulmonary:     Effort: Pulmonary effort is normal. No respiratory distress.     Breath sounds: Normal breath sounds.  Abdominal:     Palpations: Abdomen is soft.     Tenderness: There is abdominal tenderness in the right upper quadrant, epigastric area and left upper quadrant. There is no guarding or rebound.  Musculoskeletal:        General: No deformity.     Cervical back: Neck supple.     Right lower leg: No edema.     Left lower leg: No edema.  Skin:    General: Skin is warm and dry.     Capillary Refill: Capillary refill takes less than 2 seconds.  Neurological:     General: No focal deficit present.     Mental Status: She is alert.     ED Results / Procedures / Treatments   Labs (all labs ordered are listed, but only abnormal results are displayed) Labs Reviewed  LIPASE, BLOOD  COMPREHENSIVE METABOLIC PANEL  CBC  URINALYSIS, ROUTINE W REFLEX MICROSCOPIC     EKG None  Radiology No results found.  Procedures Procedures  {Document cardiac monitor, telemetry assessment procedure when appropriate:1}  Medications Ordered in ED Medications  sodium chloride 0.9 % bolus 1,000 mL (has no administration in time range)  ondansetron (ZOFRAN) injection 4 mg (has no administration in time range)  fentaNYL (SUBLIMAZE) injection 50 mcg (has no administration in time range)    ED Course/ Medical Decision Making/ A&P   {   Click here for ABCD2, HEART and other calculatorsREFRESH Note before signing :1}                          Medical Decision Making Amount and/or Complexity of Data Reviewed Labs: ordered.  Risk Prescription drug management.   This patient complains of ***; this involves an extensive number of treatment Options and is a complaint that carries with it a high risk of complications and morbidity. The differential includes ***  I ordered, reviewed and interpreted labs, which included *** I ordered medication *** and reviewed PMP when indicated. I ordered imaging studies which included *** and I independently    visualized and interpreted imaging which showed *** Additional history obtained from *** Previous records obtained and reviewed *** I consulted *** and discussed lab and imaging findings and discussed disposition.  Cardiac monitoring reviewed, *** Social determinants considered, *** Critical Interventions: ***  After the interventions stated above, I reevaluated the patient and found *** Admission and further testing considered, ***   {Document critical care time when appropriate:1} {Document review of labs and clinical decision tools ie heart score, Chads2Vasc2 etc:1}  {Document your independent review of radiology images, and any outside records:1} {Document your discussion with family members, caretakers, and with consultants:1} {Document social determinants of health affecting pt's care:1} {Document your  decision making why or why not admission, treatments were needed:1} Final Clinical Impression(s) / ED Diagnoses Final diagnoses:  None    Rx / DC Orders ED Discharge Orders     None

## 2022-04-10 NOTE — Discharge Instructions (Signed)
You were seen in the emergency department for abdominal pain and bloating and feeling constipated.  Blood work urinalysis and a CAT scan that did not show any significant abnormalities.  We did not see a lot of stool to pass.  You did have some dilated loops of air.  Please drink plenty of fluids increase your fiber and follow-up with your treatment team.  Return to the emergency department if any worsening or concerning symptoms.

## 2022-04-10 NOTE — ED Triage Notes (Signed)
Pt arrived via POV. Pt c/o central abd pain beginning yesterday am. Pt has taken several stool softeners, w/no relief.   Stage 4 lung cancer, had chemo treatment 5x days ago.  AOx4

## 2022-04-11 ENCOUNTER — Other Ambulatory Visit: Payer: Self-pay | Admitting: Emergency Medicine

## 2022-04-11 DIAGNOSIS — J3089 Other allergic rhinitis: Secondary | ICD-10-CM

## 2022-04-12 ENCOUNTER — Encounter (HOSPITAL_COMMUNITY): Payer: Self-pay

## 2022-04-12 ENCOUNTER — Ambulatory Visit (HOSPITAL_COMMUNITY)
Admission: RE | Admit: 2022-04-12 | Discharge: 2022-04-12 | Disposition: A | Discharge status: Home / Self Care | Payer: Commercial Managed Care - PPO | Source: Ambulatory Visit | Attending: Physician Assistant | Admitting: Physician Assistant

## 2022-04-12 DIAGNOSIS — C770 Secondary and unspecified malignant neoplasm of lymph nodes of head, face and neck: Secondary | ICD-10-CM | POA: Diagnosis not present

## 2022-04-12 DIAGNOSIS — R590 Localized enlarged lymph nodes: Secondary | ICD-10-CM | POA: Diagnosis present

## 2022-04-12 DIAGNOSIS — R591 Generalized enlarged lymph nodes: Secondary | ICD-10-CM | POA: Insufficient documentation

## 2022-04-12 DIAGNOSIS — I700 Atherosclerosis of aorta: Secondary | ICD-10-CM | POA: Diagnosis not present

## 2022-04-12 DIAGNOSIS — C3490 Malignant neoplasm of unspecified part of unspecified bronchus or lung: Secondary | ICD-10-CM | POA: Insufficient documentation

## 2022-04-12 DIAGNOSIS — C3411 Malignant neoplasm of upper lobe, right bronchus or lung: Secondary | ICD-10-CM

## 2022-04-12 DIAGNOSIS — J439 Emphysema, unspecified: Secondary | ICD-10-CM | POA: Insufficient documentation

## 2022-04-12 LAB — CBC WITH DIFFERENTIAL/PLATELET
Abs Immature Granulocytes: 0 10*3/uL (ref 0.00–0.07)
Basophils Absolute: 0 10*3/uL (ref 0.0–0.1)
Basophils Relative: 1 %
Eosinophils Absolute: 0 10*3/uL (ref 0.0–0.5)
Eosinophils Relative: 2 %
HCT: 35 % — ABNORMAL LOW (ref 36.0–46.0)
Hemoglobin: 11.8 g/dL — ABNORMAL LOW (ref 12.0–15.0)
Immature Granulocytes: 0 %
Lymphocytes Relative: 29 %
Lymphs Abs: 0.6 10*3/uL — ABNORMAL LOW (ref 0.7–4.0)
MCH: 32.4 pg (ref 26.0–34.0)
MCHC: 33.7 g/dL (ref 30.0–36.0)
MCV: 96.2 fL (ref 80.0–100.0)
Monocytes Absolute: 0.2 10*3/uL (ref 0.1–1.0)
Monocytes Relative: 7 %
Neutro Abs: 1.3 10*3/uL — ABNORMAL LOW (ref 1.7–7.7)
Neutrophils Relative %: 61 %
Platelets: 167 10*3/uL (ref 150–400)
RBC: 3.64 MIL/uL — ABNORMAL LOW (ref 3.87–5.11)
RDW: 11.6 % (ref 11.5–15.5)
WBC: 2 10*3/uL — ABNORMAL LOW (ref 4.0–10.5)
nRBC: 0 % (ref 0.0–0.2)

## 2022-04-12 LAB — BASIC METABOLIC PANEL
Anion gap: 6 (ref 5–15)
BUN: 13 mg/dL (ref 6–20)
CO2: 26 mmol/L (ref 22–32)
Calcium: 9 mg/dL (ref 8.9–10.3)
Chloride: 106 mmol/L (ref 98–111)
Creatinine, Ser: 0.74 mg/dL (ref 0.44–1.00)
GFR, Estimated: >60 mL/min (ref >60)
Glucose, Bld: 96 mg/dL (ref 70–99)
Potassium: 4.1 mmol/L (ref 3.5–5.1)
Sodium: 138 mmol/L (ref 135–145)

## 2022-04-12 LAB — PROTIME-INR
INR: 2.3 — ABNORMAL HIGH (ref 0.8–1.2)
Prothrombin Time: 25.2 seconds — ABNORMAL HIGH (ref 11.4–15.2)

## 2022-04-12 MED ORDER — MIDAZOLAM HCL 2 MG/2ML IJ SOLN
INTRAMUSCULAR | Status: AC
  Filled 2022-04-12: qty 2

## 2022-04-12 MED ORDER — FENTANYL CITRATE (PF) 100 MCG/2ML IJ SOLN
INTRAMUSCULAR | Status: AC
  Filled 2022-04-12: qty 2

## 2022-04-12 MED ORDER — MIDAZOLAM HCL 2 MG/2ML IJ SOLN
INTRAMUSCULAR | Status: AC | PRN
  Administered 2022-04-12 (×3): 1 mg via INTRAVENOUS

## 2022-04-12 MED ORDER — SODIUM CHLORIDE 0.9 % IV SOLN: INTRAVENOUS | Status: DC

## 2022-04-12 MED ORDER — LIDOCAINE HCL 1 % IJ SOLN
INTRAMUSCULAR | Status: AC
  Administered 2022-04-12: 15 mL
  Filled 2022-04-12: qty 20

## 2022-04-12 MED ORDER — FENTANYL CITRATE (PF) 100 MCG/2ML IJ SOLN
INTRAMUSCULAR | Status: AC | PRN
  Administered 2022-04-12: 50 ug via INTRAVENOUS

## 2022-04-12 NOTE — Discharge Instructions (Signed)
Please call Interventional Radiology clinic [phone] with any questions or concerns.   You may remove your dressing and shower tomorrow.    Needle Biopsy, Care After These instructions tell you how to care for yourself after your procedure. Your doctor may also give you more specific instructions. Call your doctor if youhave any problems or questions. What can I expect after the procedure? After the procedure, it is common to have: Soreness. Bruising. Mild pain. Follow these instructions at home:  Return to your normal activities as told by your doctor. Ask your doctor what activities are safe for you. Take over-the-counter and prescription medicines only as told by your doctor. Wash your hands with soap and water before you change your bandage (dressing). If you cannot use soap and water, use hand sanitizer. Follow instructions from your doctor about: How to take care of your puncture site. When and how to change your bandage. When to remove your bandage. Check your puncture site every day for signs of infection. Watch for: Redness, swelling, or pain. Fluid or blood. Pus or a bad smell. Warmth. Do not take baths, swim, or use a hot tub until your doctor approves. Ask your doctor if you may take showers. You may only be allowed to take sponge baths. Keep all follow-up visits as told by your doctor. This is important. Contact a doctor if you have: A fever. Redness, swelling, or pain at the puncture site, and it lasts longer than a few days. Fluid, blood, or pus coming from the puncture site. Warmth coming from the puncture site. Get help right away if: You have a lot of bleeding from the puncture site. Summary After the procedure, it is common to have soreness, bruising, or mild pain at the puncture site. Check your puncture site every day for signs of infection, such as redness, swelling, or pain. Get help right away if you have severe bleeding from your puncture  site. This information is not intended to replace advice given to you by your health care provider. Make sure you discuss any questions you have with your healthcare provider. Document Revised: 07/19/2019 Document Reviewed: 07/19/2019 Elsevier Patient Education  2022 [employer]   Moderate Conscious Sedation, Adult, Care After This sheet gives you information about how to care for yourself after your procedure. Your health care provider may also give you more specific instructions. If you have problems or questions, contact your health care provider. What can I expect after the procedure? After the procedure, it is common to have: Sleepiness for several hours. Impaired judgment for several hours. Difficulty with balance. Vomiting if you eat too soon. Follow these instructions at home: For the time period you were told by your health care provider: Rest. Do not participate in activities where you could fall or become injured. Do not drive or use machinery. Do not drink alcohol. Do not take sleeping pills or medicines that cause drowsiness. Do not make important decisions or sign legal documents. Do not take care of children on your own.      Eating and drinking Follow the diet recommended by your health care provider. Drink enough fluid to keep your urine pale yellow. If you vomit: Drink water, juice, or soup when you can drink without vomiting. Make sure you have little or no nausea before eating solid foods.   General instructions Take over-the-counter and prescription medicines only as told by your health care provider. Have a responsible adult stay with you for the time you are told.   It is important to have someone help care for you until you are awake and alert. Do not smoke. Keep all follow-up visits as told by your health care provider. This is important. Contact a health care provider if: You are still sleepy or having trouble with balance after 24 hours. You feel  light-headed. You keep feeling nauseous or you keep vomiting. You develop a rash. You have a fever. You have redness or swelling around the IV site. Get help right away if: You have trouble breathing. You have new-onset confusion at home. Summary After the procedure, it is common to feel sleepy, have impaired judgment, or feel nauseous if you eat too soon. Rest after you get home. Know the things you should not do after the procedure. Follow the diet recommended by your health care provider and drink enough fluid to keep your urine pale yellow. Get help right away if you have trouble breathing or new-onset confusion at home. This information is not intended to replace advice given to you by your health care provider. Make sure you discuss any questions you have with your health care provider. Document Revised: 05/18/2019 Document Reviewed: 12/14/2018 Elsevier Patient Education  2021 [employer]     

## 2022-04-12 NOTE — H&P (Addendum)
Referring Physician(s): Mohamed,M  Supervising Physician: Markus Daft  Patient Status:  [facility]  Chief Complaint: "I'm here for a neck biopsy"   Subjective: Patient known to IR service from right cervical/supraclavicular lymph node biopsy on 10/01/2021.  She has a history of recurrent lung cancer (adenoca, initially 2022) and PET scan on 03/29/2022 that revealed; 1. There is persistent hypermetabolic supraclavicular and thoracic inlet lymphadenopathy, some of which shows slightly increased hypermetabolism and some of which shows slightly decreased hypermetabolism. 2. There is a new hypermetabolic precarinal lymph node on today's study consistent with new metastatic involvement. 3. Interval development of hypermetabolic lymphadenopathy in the upper abdomen consistent with new metastatic disease. 4. Small hypermetabolic lesion in the medial right liver is new in the interval, consistent with metastatic disease. 5. Aortic Atherosclerosis (ICD10-I70.0) and Emphysema  She presents today for ultrasound-guided left supraclavicular lymph node biopsy for further evaluation.  She denies fever, chest pain, dyspnea, cough, neck pain, nausea, vomiting or bleeding.  She does have a mild headache and intermittent epigastric discomfort.  Additional medical history as below.   Past Medical History:  Diagnosis Date   Abdominal discomfort    Cancer ([place])    Chronic headaches    due to allergies, sinus   COPD (chronic obstructive pulmonary disease) ([place])    per 2012 chest xray   pt states she doesn not have this now (04/10/2013)   Deviated nasal septum    Eustachian tube dysfunction    GERD (gastroesophageal reflux disease)    occasional uses Tums / Rolaids   Hearing loss    right ear   High cholesterol    History of radiation therapy    right lung 01/07/2021-02/19/2021  Dr Gery Pray   Migraine    "only once in a blue moon since RX'd allergy shots" (04/10/2013)   Pancreatitis 02/08/2013    Pneumonia    Rhinitis, allergic    Past Surgical History:  Procedure Laterality Date   ABDOMINAL HYSTERECTOMY  1995   tx endometriosis, both ovaries removed   APPENDECTOMY  late 1990's   BIOPSY  02/26/2021   Procedure: BIOPSY;  Surgeon: Milus Banister, MD;  Location: [facility];  Service: Endoscopy;;   BRONCHIAL BRUSHINGS  12/15/2020   Procedure: BRONCHIAL BRUSHINGS;  Surgeon: Collene Gobble, MD;  Location: [place];  Service: Cardiopulmonary;;   BRONCHIAL NEEDLE ASPIRATION BIOPSY  12/15/2020   Procedure: BRONCHIAL NEEDLE ASPIRATION BIOPSIES;  Surgeon: Collene Gobble, MD;  Location: [place];  Service: Cardiopulmonary;;   CHOLECYSTECTOMY  04/10/2013   CHOLECYSTECTOMY N/A 04/10/2013   Procedure: LAPAROSCOPIC CHOLECYSTECTOMY WITH INTRAOPERATIVE CHOLANGIOGRAM;  Surgeon: Odis Hollingshead, MD;  Location: [place];  Service: General;  Laterality: N/A;   ELECTROMAGNETIC NAVIGATION BROCHOSCOPY  12/15/2020   Procedure: ELECTROMAGNETIC NAVIGATION BRONCHOSCOPY;  Surgeon: Collene Gobble, MD;  Location: [facility] ENDOSCOPY;  Service: Cardiopulmonary;;   ESOPHAGOGASTRODUODENOSCOPY (EGD) WITH PROPOFOL N/A 02/26/2021   Procedure: ESOPHAGOGASTRODUODENOSCOPY (EGD) WITH PROPOFOL;  Surgeon: Milus Banister, MD;  Location: [facility];  Service: Endoscopy;  Laterality: N/A;   EUS N/A 02/16/2013   Procedure: UPPER ENDOSCOPIC ULTRASOUND (EUS) LINEAR;  Surgeon: Milus Banister, MD;  Location: [facility];  Service: Endoscopy;  Laterality: N/A;  radial linear   KNEE ARTHROSCOPY Right 1980's   "cartilage OR"   LAPAROSCOPIC ENDOMETRIOSIS FULGURATION  1980's   MYRINGOTOMY WITH TUBE PLACEMENT Right 07/13/2018   Procedure: MYRINGOTOMY WITH TUBE PLACEMENT;  Surgeon: Margaretha Sheffield, MD;  Location: [place];  Service: ENT;  Laterality:  Right;   MYRINGOTOMY WITH TUBE PLACEMENT Right 06/04/2021   Procedure: MYRINGOTOMY WITH BUTTERFLY TUBE PLACEMENT;  Surgeon: Margaretha Sheffield, MD;  Location: [place];  Service: ENT;  Laterality: Right;   NASOPHARYNGOSCOPY EUSTATION TUBE BALLOON DILATION Right 07/13/2018   Procedure: NASOPHARYNGOSCOPY EUSTATION TUBE BALLOON DILATION;  Surgeon: Margaretha Sheffield, MD;  Location: [place];  Service: ENT;  Laterality: Right;   TONSILLECTOMY AND ADENOIDECTOMY  ~ 1980   adenoidectomy   TUBAL LIGATION  ~ [place] Right 07/13/2018   Procedure: OUTFRACTURE TURBINATE;  Surgeon: Margaretha Sheffield, MD;  Location: [place];  Service: ENT;  Laterality: Right;   VIDEO BRONCHOSCOPY WITH ENDOBRONCHIAL ULTRASOUND N/A 12/15/2020   Procedure: ROBOTIC VIDEO BRONCHOSCOPY WITH ENDOBRONCHIAL ULTRASOUND;  Surgeon: Collene Gobble, MD;  Location: [place];  Service: Cardiopulmonary;  Laterality: N/A;   WRIST SURGERY Left    w/plate     Allergies: Benadryl [diphenhydramine]  Medications: Prior to Admission medications   Medication Sig Start Date End Date Taking? Authorizing Provider  acetaminophen (TYLENOL) 500 MG tablet Take 1,000 mg by mouth every 6 (six) hours as needed for moderate pain.   Yes [provider]  aspirin EC 81 MG tablet Take 81 mg by mouth at bedtime.   Yes [provider]  atorvastatin (LIPITOR) 40 MG tablet Take 40 mg by mouth at bedtime.   Yes [provider]  benzonatate (TESSALON) 200 MG capsule Take 200 mg by mouth 3 (three) times daily as needed. 10/23/21  Yes [provider]  Biotin 5000 MCG TABS Take 5,000 mcg by mouth at bedtime.   Yes [provider]  Ca Carbonate-Mag Hydroxide (ROLAIDS PO) Take 1 tablet by mouth daily as needed (heartburn).   Yes [provider]  fluticasone (FLONASE) 50 MCG/ACT nasal spray Place 2 sprays into both nostrils daily as needed for allergies.   Yes [provider]  folic acid (FOLVITE) 1 MG tablet Take 1 tablet (1 mg total) by mouth daily. 03/29/22  Yes Heilingoetter, Cassandra L, PA-C  loratadine (CLARITIN) 10 MG  tablet TAKE 1 TABLET BY MOUTH EVERY DAY 04/12/22  Yes [name], Rose Fillers, MD  Multiple Vitamins-Minerals (HAIR SKIN AND NAILS FORMULA PO) Take 1 tablet by mouth daily.   Yes [provider]  ondansetron (ZOFRAN-ODT) 8 MG disintegrating tablet Take 8 mg by mouth 3 (three) times daily as needed. 10/23/21  Yes [provider]  Oxycodone HCl 10 MG TABS Take 1 tablet by mouth 3 (three) times daily as needed (pain).   Yes [provider]  prochlorperazine (COMPAZINE) 10 MG tablet Take 1 tablet (10 mg total) by mouth every 6 (six) hours as needed. 03/29/22  Yes Heilingoetter, Cassandra L, PA-C  rizatriptan (MAXALT-MLT) 10 MG disintegrating tablet Take 10 mg by mouth as needed for migraine. May repeat in 2 hours if needed   Yes [provider]  sertraline (ZOLOFT) 50 MG tablet Take 1 tablet by mouth daily.   Yes [provider]  albuterol (PROVENTIL) (2.5 MG/3ML) 0.083% nebulizer solution albuterol sulfate 2.5 mg/3 mL (0.083 %) solution for nebulization  USE 1 VIAL IN NEBULIZER EVERY 6 HOURS AS NEEDED FOR WHEEZING OR SHORTNESS OF BREATH    [provider]  amoxicillin-clavulanate (AUGMENTIN) 875-125 MG tablet Take 1 tablet by mouth 2 (two) times daily. 11/11/21   Collene Gobble, MD  Tiotropium Bromide-Olodaterol (STIOLTO RESPIMAT) 2.5-2.5 MCG/ACT AERS Inhale 2 puffs into the lungs daily. 11/26/21   Collene Gobble,  MD     Vital Signs: BP (P) 103/69   Pulse (P) 94   Temp (P) 97.7 F (36.5 C) (Oral)   Resp (P) 16   SpO2 (P) 99%    Code Status: FULL CODE  Physical Exam awake, alert.  Chest clear to auscultation bilaterally.  Heart with regular rate and rhythm.  Abdomen soft, positive bowel sounds, some mild epigastric tenderness to palpation.  No lower extremity edema.  Palpable left supraclavicular adenopathy, nontender.  Imaging: CT Abdomen Pelvis W Contrast  Result Date: 04/10/2022 CLINICAL DATA:  Acute abdominal pain. History of non-small cell  lung cancer. EXAM: CT ABDOMEN AND PELVIS WITH CONTRAST TECHNIQUE: Multidetector CT imaging of the abdomen and pelvis was performed using the standard protocol following bolus administration of intravenous contrast. RADIATION DOSE REDUCTION: This exam was performed according to the departmental dose-optimization program which includes automated exposure control, adjustment of the mA and/or kV according to patient size and/or use of iterative reconstruction technique. CONTRAST:  124m OMNIPAQUE IOHEXOL 300 MG/ML  SOLN COMPARISON:  CT abdomen and pelvis 02/22/2021.  PET-CT 03/26/2022. FINDINGS: Lower chest: No acute abnormality. Hepatobiliary: Patient is status post cholecystectomy. There is no biliary ductal dilatation. There is a rounded 14 mm lesion in the right lobe of the liver which measured 1 cm on prior study. No new liver lesions are identified. Pancreas: Mild prominence of the pancreatic duct is unchanged. No focal pancreatic lesions or acute inflammation. Spleen: Normal in size without focal abnormality. Adrenals/Urinary Tract: Mildly hyperdense rounded area in the right renal cortex measures less than 1 cm appears unchanged. Subcentimeter cortical cyst in the left kidney is unchanged. There is no hydronephrosis or perinephric fluid. Adrenal glands and bladder within normal limits. Stomach/Bowel: Stomach is within normal limits. No evidence of bowel wall thickening, distention, or inflammatory changes. Sigmoid colon diverticula are present. The appendix is likely surgically absent. Vascular/Lymphatic: Aorta and IVC are normal in size. There are atherosclerotic calcifications of the aorta. Enlarged portacaval lymph node measures 12 mm short axis. There are multiple enlarged periportal lymph nodes measuring up to 16 mm short axis. These have not significantly changed. Reproductive: Status post hysterectomy. No adnexal masses. Other: No abdominal wall hernia or abnormality. No abdominopelvic ascites.  Musculoskeletal: No acute or significant osseous findings. IMPRESSION: 1. No acute localizing process in the abdomen or pelvis. 2. 14 mm metastatic lesion in the right lobe of the liver has mildly increased in size; this may be related to differences in technique. 3. Unchanged periportal and portacaval lymphadenopathy. 4. Stable mildly hyperdense lesion in the right kidney, indeterminate. This can be further evaluated with MRI. 5. Colonic diverticulosis without evidence for diverticulitis. Aortic Atherosclerosis (ICD10-I70.0). Electronically Signed   By: ARonney AstersM.D.   On: 04/10/2022 18:30    Labs:  CBC: Recent Labs    09/07/21 1058 03/12/22 0953 04/05/22 0802 04/10/22 1656  WBC 10.8* 6.8 4.0 3.5*  HGB 14.5 14.4 12.4 12.6  HCT 41.5 40.6 35.9* 36.6  PLT 244 240 283 236    COAGS: No results for input(s): "INR", "APTT" in the last 8760 hours.  BMP: Recent Labs    09/07/21 1058 03/12/22 0953 04/05/22 0802 04/10/22 1656  NA 141 139 140 135  K 3.2* 4.1 3.7 3.6  CL 104 104 105 102  CO2 '30 25 27 25  '$ GLUCOSE 102* 105* 110* 122*  BUN '14 12 16 15  '$ CALCIUM 9.8 10.1 9.9 9.3  CREATININE 0.80 0.74 0.82 0.72  GFRNONAA >60 >60 >60 >  60    LIVER FUNCTION TESTS: Recent Labs    09/07/21 1058 03/12/22 0953 04/05/22 0802 04/10/22 1656  BILITOT 0.6 1.3* 0.4 0.7  AST 14* 55* 15 22  ALT 17 50* 15 27  ALKPHOS 119 126 108 106  PROT 8.3* 7.8 7.3 8.2*  ALBUMIN 4.7 4.1 3.9 4.0    Assessment and Plan: Patient known to IR service from right cervical/supraclavicular lymph node biopsy on 10/01/2021.  She has a history of COPD, GERD, HLD, and recurrent lung cancer (adenoca, initially 2022) and PET scan on 03/29/2022 that revealed; 1. There is persistent hypermetabolic supraclavicular and thoracic inlet lymphadenopathy, some of which shows slightly increased hypermetabolism and some of which shows slightly decreased hypermetabolism. 2. There is a new hypermetabolic precarinal lymph node on  today's study consistent with new metastatic involvement. 3. Interval development of hypermetabolic lymphadenopathy in the upper abdomen consistent with new metastatic disease. 4. Small hypermetabolic lesion in the medial right liver is new in the interval, consistent with metastatic disease. 5. Aortic Atherosclerosis (ICD10-I70.0) and Emphysema  She presents today for ultrasound-guided left supraclavicular lymph node biopsy for further evaluation. Risks and benefits of procedure was discussed with the patient  including, but not limited to bleeding, infection, damage to adjacent structures or low yield requiring additional tests.  All of the questions were answered and there is agreement to proceed.  Consent signed and in chart.    Electronically Signed: D. Rowe Robert, PA-C 04/12/2022, 12:16 PM   I spent a total of 20 minutes at the the patient's bedside AND on the patient's hospital floor or unit, greater than 50% of which was counseling/coordinating care for US guided left supraclavicular lymph node biopsy

## 2022-04-12 NOTE — Procedures (Signed)
Interventional Radiology Procedure:   Indications: History of lung cancer and concern for progression of disease  Procedure: US guided core biopsy of left supraclavicular lymph node  Findings: Multiple enlarged left cervical nodes, 5 core biopsies obtained, specimens placed in saline.   Complications: No immediate complications noted.     EBL: Minimal  Plan: Discharge to home.   Julie Nicholson R. Anselm Pancoast, MD  Pager: [phone]

## 2022-04-13 ENCOUNTER — Other Ambulatory Visit: Payer: Self-pay

## 2022-04-13 ENCOUNTER — Inpatient Hospital Stay (HOSPITAL_BASED_OUTPATIENT_CLINIC_OR_DEPARTMENT_OTHER): Payer: Commercial Managed Care - PPO | Admitting: Internal Medicine

## 2022-04-13 ENCOUNTER — Encounter: Payer: Self-pay | Admitting: Internal Medicine

## 2022-04-13 ENCOUNTER — Inpatient Hospital Stay: Payer: Commercial Managed Care - PPO

## 2022-04-13 VITALS — BP 119/68 | HR 97 | Temp 97.9°F | Resp 16 | Ht 62.0 in | Wt 112.6 lb

## 2022-04-13 DIAGNOSIS — C3491 Malignant neoplasm of unspecified part of right bronchus or lung: Secondary | ICD-10-CM | POA: Diagnosis not present

## 2022-04-13 DIAGNOSIS — C3411 Malignant neoplasm of upper lobe, right bronchus or lung: Secondary | ICD-10-CM

## 2022-04-13 DIAGNOSIS — Z5112 Encounter for antineoplastic immunotherapy: Secondary | ICD-10-CM | POA: Diagnosis not present

## 2022-04-13 LAB — CBC WITH DIFFERENTIAL (CANCER CENTER ONLY)
Abs Immature Granulocytes: 0.01 10*3/uL (ref 0.00–0.07)
Basophils Absolute: 0 10*3/uL (ref 0.0–0.1)
Basophils Relative: 0 %
Eosinophils Absolute: 0.1 10*3/uL (ref 0.0–0.5)
Eosinophils Relative: 2 %
HCT: 34 % — ABNORMAL LOW (ref 36.0–46.0)
Hemoglobin: 11.6 g/dL — ABNORMAL LOW (ref 12.0–15.0)
Immature Granulocytes: 0 %
Lymphocytes Relative: 23 %
Lymphs Abs: 0.6 10*3/uL — ABNORMAL LOW (ref 0.7–4.0)
MCH: 32.6 pg (ref 26.0–34.0)
MCHC: 34.1 g/dL (ref 30.0–36.0)
MCV: 95.5 fL (ref 80.0–100.0)
Monocytes Absolute: 0.1 10*3/uL (ref 0.1–1.0)
Monocytes Relative: 5 %
Neutro Abs: 1.7 10*3/uL (ref 1.7–7.7)
Neutrophils Relative %: 70 %
Platelet Count: 133 10*3/uL — ABNORMAL LOW (ref 150–400)
RBC: 3.56 MIL/uL — ABNORMAL LOW (ref 3.87–5.11)
RDW: 11.4 % — ABNORMAL LOW (ref 11.5–15.5)
WBC Count: 2.4 10*3/uL — ABNORMAL LOW (ref 4.0–10.5)
nRBC: 0 % (ref 0.0–0.2)

## 2022-04-13 LAB — CMP (CANCER CENTER ONLY)
ALT: 25 U/L (ref 0–44)
AST: 23 U/L (ref 15–41)
Albumin: 3.9 g/dL (ref 3.5–5.0)
Alkaline Phosphatase: 121 U/L (ref 38–126)
Anion gap: 8 (ref 5–15)
BUN: 16 mg/dL (ref 6–20)
CO2: 27 mmol/L (ref 22–32)
Calcium: 9.7 mg/dL (ref 8.9–10.3)
Chloride: 106 mmol/L (ref 98–111)
Creatinine: 0.85 mg/dL (ref 0.44–1.00)
GFR, Estimated: >60 mL/min (ref >60)
Glucose, Bld: 109 mg/dL — ABNORMAL HIGH (ref 70–99)
Potassium: 3.8 mmol/L (ref 3.5–5.1)
Sodium: 141 mmol/L (ref 135–145)
Total Bilirubin: 0.3 mg/dL (ref 0.3–1.2)
Total Protein: 7.4 g/dL (ref 6.5–8.1)

## 2022-04-13 NOTE — Progress Notes (Signed)
[facility] [phone]   [phone]  OFFICE PROGRESS NOTE  [name], NP [place] [address]  DIAGNOSIS: Recurrent/metastatic non-small cell lung cancer initially diagnosed as stage IIIB  (T1b, N3, M0) non-small cell lung cancer, adenocarcinoma she presented with right upper lobe nodule in addition to bulky right hilar, mediastinal, and left supraclavicular lymphadenopathy diagnosed in November 2022.  The patient had evidence for disease recurrence in the mediastinal and right supraclavicular lymphadenopathy in August 2023.  The patient had evidence of metastatic disease in February 2024 with several supraclavicular, thoracic and precarinal lymphadenopathy as well as upper abdominal lymph nodes and small liver lesion.  DETECTED ALTERATION(S) / BIOMARKER(S) % CFDNA OR AMPLIFICATION ASSOCIATED FDA-APPROVED THERAPIES CLINICAL TRIAL AVAILABILITY [id number] ND 0.5 5 50 100 4.7%  [id number] ND 0.5 5 50 100 1.8%  [id number] ND 0.5 5 50 100 1.9%  BIOMARKER ADDITIONAL DETAILS Tumor Mutational Burden (TMB) 19.02 mut/Mb MSI Status Stable (MSS) PD-L1 Tumor Proportion Score (TPS)* <1%   PRIOR THERAPY:  1) Concurrent chemoradiation with carboplatin for an AUC of 2 and paclitaxel 45 mg per metered squared.  First dose on 01/05/2021.  Status post 7 cycles of treatment.  Last dose was given February 16, 2021. 2) Consolidation immunotherapy with Imfinzi 1500 Mg IV every 4 weeks.  First dose March 25, 2021.  Status post 3 cycles.  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 3) Palliative radiotherapy to the enlarging right supraclavicular lymphadenopathy under the care of Dr. Sondra Come expected to be completed on October 28, 2021.   CURRENT THERAPY: Palliative systemic chemotherapy with carboplatin for AUC of 5, Alimta 500 Mg/M2 and Keytruda 200 Mg IV every 3 weeks.  First dose April 05, 2022.   Status post 1 cycle.  INTERVAL HISTORY: Julie Nicholson 57 y.o. female returns to the clinic today for follow-up visit.  The patient tolerated the first week of her treatment fairly well except for abdominal pain and constipation.  She had CT of the abdomen and pelvis on April 10, 2022 and that showed no acute localizing process in the abdomen or pelvis with colonic diverticulosis without evidence of diverticulitis.  She is feeling much better now.  She denied having any current nausea, vomiting, diarrhea or constipation.  She has no headache or visual changes.  She denied having any recent weight loss or night sweats.  She is here today for evaluation and repeat blood work.   MEDICAL HISTORY: Past Medical History:  Diagnosis Date   Abdominal discomfort    Cancer ([place])    Chronic headaches    due to allergies, sinus   COPD (chronic obstructive pulmonary disease) ([place])    per 2012 chest xray   pt states she doesn not have this now (04/10/2013)   Deviated nasal septum    Eustachian tube dysfunction    GERD (gastroesophageal reflux disease)    occasional uses Tums / Rolaids   Hearing loss    right ear   High cholesterol    History of radiation therapy    right lung 01/07/2021-02/19/2021  Dr Gery Pray   Migraine    "only once in a blue moon since RX'd allergy shots" (04/10/2013)   Pancreatitis 02/08/2013   Pneumonia    Rhinitis, allergic     ALLERGIES:  is allergic to benadryl [diphenhydramine].  MEDICATIONS:  Current Outpatient Medications  Medication Sig Dispense Refill   buPROPion (WELLBUTRIN XL) 150 MG 24 hr tablet Take  150 mg by mouth every morning.     acetaminophen (TYLENOL) 500 MG tablet Take 1,000 mg by mouth every 6 (six) hours as needed for moderate pain.     albuterol (PROVENTIL) (2.5 MG/3ML) 0.083% nebulizer solution albuterol sulfate 2.5 mg/3 mL (0.083 %) solution for nebulization  USE 1 VIAL IN NEBULIZER EVERY 6 HOURS AS NEEDED FOR WHEEZING OR SHORTNESS OF BREATH      aspirin EC 81 MG tablet Take 81 mg by mouth at bedtime.     atorvastatin (LIPITOR) 40 MG tablet Take 40 mg by mouth at bedtime.     benzonatate (TESSALON) 200 MG capsule Take 200 mg by mouth 3 (three) times daily as needed.     Biotin 5000 MCG TABS Take 5,000 mcg by mouth at bedtime.     Ca Carbonate-Mag Hydroxide (ROLAIDS PO) Take 1 tablet by mouth daily as needed (heartburn).     fluticasone (FLONASE) 50 MCG/ACT nasal spray Place 2 sprays into both nostrils daily as needed for allergies.     folic acid (FOLVITE) 1 MG tablet Take 1 tablet (1 mg total) by mouth daily. 30 tablet 2   loratadine (CLARITIN) 10 MG tablet TAKE 1 TABLET BY MOUTH EVERY DAY 90 tablet 1   Multiple Vitamins-Minerals (HAIR SKIN AND NAILS FORMULA PO) Take 1 tablet by mouth daily.     ondansetron (ZOFRAN-ODT) 8 MG disintegrating tablet Take 8 mg by mouth 3 (three) times daily as needed.     Oxycodone HCl 10 MG TABS Take 1 tablet by mouth 3 (three) times daily as needed (pain).     prochlorperazine (COMPAZINE) 10 MG tablet Take 1 tablet (10 mg total) by mouth every 6 (six) hours as needed. 30 tablet 2   rizatriptan (MAXALT-MLT) 10 MG disintegrating tablet Take 10 mg by mouth as needed for migraine. May repeat in 2 hours if needed     sertraline (ZOLOFT) 50 MG tablet Take 1 tablet by mouth daily.     Tiotropium Bromide-Olodaterol (STIOLTO RESPIMAT) 2.5-2.5 MCG/ACT AERS Inhale 2 puffs into the lungs daily. 4 g 3   No current facility-administered medications for this visit.    SURGICAL HISTORY:  Past Surgical History:  Procedure Laterality Date   ABDOMINAL HYSTERECTOMY  1995   tx endometriosis, both ovaries removed   APPENDECTOMY  late 1990's   BIOPSY  02/26/2021   Procedure: BIOPSY;  Surgeon: Milus Banister, MD;  Location: [facility];  Service: Endoscopy;;   BRONCHIAL BRUSHINGS  12/15/2020   Procedure: BRONCHIAL BRUSHINGS;  Surgeon: Collene Gobble, MD;  Location: [facility];  Service: Cardiopulmonary;;   BRONCHIAL  NEEDLE ASPIRATION BIOPSY  12/15/2020   Procedure: BRONCHIAL NEEDLE ASPIRATION BIOPSIES;  Surgeon: Collene Gobble, MD;  Location: [place];  Service: Cardiopulmonary;;   CHOLECYSTECTOMY  04/10/2013   CHOLECYSTECTOMY N/A 04/10/2013   Procedure: LAPAROSCOPIC CHOLECYSTECTOMY WITH INTRAOPERATIVE CHOLANGIOGRAM;  Surgeon: Odis Hollingshead, MD;  Location: [place];  Service: General;  Laterality: N/A;   ELECTROMAGNETIC NAVIGATION BROCHOSCOPY  12/15/2020   Procedure: ELECTROMAGNETIC NAVIGATION BRONCHOSCOPY;  Surgeon: Collene Gobble, MD;  Location[facility];  Service: Cardiopulmonary;;   ESOPHAGOGASTRODUODENOSCOPY (EGD) WITH PROPOFOL N/A 02/26/2021   Procedure: ESOPHAGOGASTRODUODENOSCOPY (EGD) WITH PROPOFOL;  Surgeon: Milus Banister, MD;  Location: [facility];  Service: Endoscopy;  Laterality: N/A;   EUS N/A 02/16/2013   Procedure: UPPER ENDOSCOPIC ULTRASOUND (EUS) LINEAR;  Surgeon: Milus Banister, MD;  Location: [facility];  Service: Endoscopy;  Laterality: N/A;  radial linear   KNEE ARTHROSCOPY  Right 1980's   "cartilage OR"   LAPAROSCOPIC ENDOMETRIOSIS FULGURATION  1980's   MYRINGOTOMY WITH TUBE PLACEMENT Right 07/13/2018   Procedure: MYRINGOTOMY WITH TUBE PLACEMENT;  Surgeon: Margaretha Sheffield, MD;  Location: [place];  Service: ENT;  Laterality: Right;   MYRINGOTOMY WITH TUBE PLACEMENT Right 06/04/2021   Procedure: MYRINGOTOMY WITH BUTTERFLY TUBE PLACEMENT;  Surgeon: Margaretha Sheffield, MD;  Location: [place];  Service: ENT;  Laterality: Right;   NASOPHARYNGOSCOPY EUSTATION TUBE BALLOON DILATION Right 07/13/2018   Procedure: NASOPHARYNGOSCOPY EUSTATION TUBE BALLOON DILATION;  Surgeon: Margaretha Sheffield, MD;  Location: [place];  Service: ENT;  Laterality: Right;   TONSILLECTOMY AND ADENOIDECTOMY  ~ 1980   adenoidectomy   TUBAL LIGATION  ~ [place] Right 07/13/2018   Procedure: OUTFRACTURE TURBINATE;  Surgeon: Margaretha Sheffield, MD;  Location: [place];  Service: ENT;  Laterality: Right;   VIDEO BRONCHOSCOPY WITH ENDOBRONCHIAL ULTRASOUND N/A 12/15/2020   Procedure: ROBOTIC VIDEO BRONCHOSCOPY WITH ENDOBRONCHIAL ULTRASOUND;  Surgeon: Collene Gobble, MD;  Location: [facility];  Service: Cardiopulmonary;  Laterality: N/A;   WRIST SURGERY Left    w/plate    REVIEW OF SYSTEMS:  A comprehensive review of systems was negative except for: Constitutional: positive for fatigue   PHYSICAL EXAMINATION: General appearance: alert, cooperative, fatigued, and no distress Head: Normocephalic, without obvious abnormality, atraumatic Neck: moderate anterior cervical adenopathy, no JVD, supple, symmetrical, trachea midline, and thyroid not enlarged, symmetric, no tenderness/mass/nodules Lymph nodes: Cervical, supraclavicular, and axillary nodes normal. Resp: clear to auscultation bilaterally Back: symmetric, no curvature. ROM normal. No CVA tenderness. Cardio: regular rate and rhythm, S1, S2 normal, no murmur, click, rub or gallop GI: soft, non-tender; bowel sounds normal; no masses,  no organomegaly Extremities: extremities normal, atraumatic, no cyanosis or edema  ECOG PERFORMANCE STATUS: 1 - Symptomatic but completely ambulatory  Blood pressure 119/68, pulse 97, temperature 97.9 F (36.6 C), temperature source Temporal, resp. rate 16, height '5\' 2"'$  (1.575 m), weight 112 lb 9.6 oz (51.1 kg), SpO2 100 %.  LABORATORY DATA: Lab Results  Component Value Date   WBC 2.4 (L) 04/13/2022   HGB 11.6 (L) 04/13/2022   HCT 34.0 (L) 04/13/2022   MCV 95.5 04/13/2022   PLT 133 (L) 04/13/2022      Chemistry      Component Value Date/Time   NA 138 04/12/2022 1210   K 4.1 04/12/2022 1210   CL 106 04/12/2022 1210   CO2 26 04/12/2022 1210   BUN 13 04/12/2022 1210   CREATININE 0.74 04/12/2022 1210   CREATININE 0.82 04/05/2022 0802      Component Value Date/Time   CALCIUM 9.0 04/12/2022 1210   ALKPHOS 106 04/10/2022 1656   AST 22 04/10/2022  1656   AST 15 04/05/2022 0802   ALT 27 04/10/2022 1656   ALT 15 04/05/2022 0802   BILITOT 0.7 04/10/2022 1656   BILITOT 0.4 04/05/2022 0802       RADIOGRAPHIC STUDIES: [place] CORE BIOPSY (LYMPH NODES)  Result Date: 04/12/2022 INDICATION: History of lung cancer with diffuse lymphadenopathy on recent imaging and request for tissue sampling. EXAM: ULTRASOUND-GUIDED BIOPSY OF LEFT SUPRACLAVICULAR LYMPH NODE MEDICATIONS: Moderate sedation ANESTHESIA/SEDATION: Moderate (conscious) sedation was employed during this procedure. A total of Versed 3 mg and Fentanyl 100 mcg was administered intravenously by the radiology nurse. Total intra-service moderate Sedation Time: 10 minutes. The patient's level of consciousness and vital signs were monitored continuously by radiology nursing throughout the procedure under my direct supervision.  FLUOROSCOPY TIME:  None COMPLICATIONS: None immediate. PROCEDURE: Informed written consent was obtained from the patient after a thorough discussion of the procedural risks, benefits and alternatives. All questions were addressed. A timeout was performed prior to the initiation of the procedure. Ultrasound demonstrated multiple enlarged left supraclavicular lymph nodes. Lymph node just anterior and lateral to the left internal jugular vein was targeted for biopsy. Left side of the neck was prepped with chlorhexidine and sterile field was created. Skin was anesthetized with 1% lidocaine. Small incision was made. Using ultrasound guidance, 18 gauge core biopsy needle was directed into the targeted lymph node. Total of 5 core biopsies were obtained. Specimens placed on a Telfa pad with saline. Biopsy needle confirmed within the lymph node on all occasions. Bandage placed over the puncture site. FINDINGS: Several enlarged hypoechoic lymph nodes in left supraclavicular region. Lymph node just anterior and lateral to left internal jugular vein was sampled. No immediate bleeding or hematoma  formation. IMPRESSION: Ultrasound-guided core biopsy of a left supraclavicular lymph node. Electronically Signed   By: Markus Daft M.D.   On: 04/12/2022 21:19   CT Abdomen Pelvis W Contrast  Result Date: 04/10/2022 CLINICAL DATA:  Acute abdominal pain. History of non-small cell lung cancer. EXAM: CT ABDOMEN AND PELVIS WITH CONTRAST TECHNIQUE: Multidetector CT imaging of the abdomen and pelvis was performed using the standard protocol following bolus administration of intravenous contrast. RADIATION DOSE REDUCTION: This exam was performed according to the departmental dose-optimization program which includes automated exposure control, adjustment of the mA and/or kV according to patient size and/or use of iterative reconstruction technique. CONTRAST:  142m OMNIPAQUE IOHEXOL 300 MG/ML  SOLN COMPARISON:  CT abdomen and pelvis 02/22/2021.  PET-CT 03/26/2022. FINDINGS: Lower chest: No acute abnormality. Hepatobiliary: Patient is status post cholecystectomy. There is no biliary ductal dilatation. There is a rounded 14 mm lesion in the right lobe of the liver which measured 1 cm on prior study. No new liver lesions are identified. Pancreas: Mild prominence of the pancreatic duct is unchanged. No focal pancreatic lesions or acute inflammation. Spleen: Normal in size without focal abnormality. Adrenals/Urinary Tract: Mildly hyperdense rounded area in the right renal cortex measures less than 1 cm appears unchanged. Subcentimeter cortical cyst in the left kidney is unchanged. There is no hydronephrosis or perinephric fluid. Adrenal glands and bladder within normal limits. Stomach/Bowel: Stomach is within normal limits. No evidence of bowel wall thickening, distention, or inflammatory changes. Sigmoid colon diverticula are present. The appendix is likely surgically absent. Vascular/Lymphatic: Aorta and IVC are normal in size. There are atherosclerotic calcifications of the aorta. Enlarged portacaval lymph node measures 12  mm short axis. There are multiple enlarged periportal lymph nodes measuring up to 16 mm short axis. These have not significantly changed. Reproductive: Status post hysterectomy. No adnexal masses. Other: No abdominal wall hernia or abnormality. No abdominopelvic ascites. Musculoskeletal: No acute or significant osseous findings. IMPRESSION: 1. No acute localizing process in the abdomen or pelvis. 2. 14 mm metastatic lesion in the right lobe of the liver has mildly increased in size; this may be related to differences in technique. 3. Unchanged periportal and portacaval lymphadenopathy. 4. Stable mildly hyperdense lesion in the right kidney, indeterminate. This can be further evaluated with MRI. 5. Colonic diverticulosis without evidence for diverticulitis. Aortic Atherosclerosis (ICD10-I70.0). Electronically Signed   By: ARonney AstersM.D.   On: 04/10/2022 18:30   MR Brain W Wo Contrast  Result Date: 04/06/2022 CLINICAL DATA:  Metastatic disease evaluation. EXAM: MRI  HEAD WITHOUT AND WITH CONTRAST TECHNIQUE: Multiplanar, multiecho pulse sequences of the brain and surrounding structures were obtained without and with intravenous contrast. CONTRAST:  40m GADAVIST GADOBUTROL 1 MMOL/ML IV SOLN COMPARISON:  Brain MRI 09/30/2021 FINDINGS: Brain: There is no acute intracranial hemorrhage, extra-axial fluid collection, or acute infarct Parenchymal volume is normal the ventricles are normal in size. A few small foci of FLAIR signal abnormality in the supratentorial white matter are nonspecific but may reflect sequela of minimal chronic small-vessel ischemic change The pituitary and suprasellar region are normal. A right cerebellar hemisphere developmental venous anomaly is noted. There is no mass lesion or abnormal enhancement. There is no mass effect or midline shift. Vascular: Normal flow voids. Skull and upper cervical spine: Normal marrow signal. Sinuses/Orbits: The paranasal sinuses are clear. The globes and orbits  are unremarkable. Other: None. IMPRESSION: No evidence of intracranial metastatic disease. Electronically Signed   By: PValetta MoleM.D.   On: 04/06/2022 13:25   NM PET Image Restage (PS) Skull Base to Thigh (F-18 FDG)  Result Date: 03/29/2022 CLINICAL DATA:  Subsequent treatment strategy for non-small cell lung cancer. EXAM: NUCLEAR MEDICINE PET SKULL BASE TO THIGH TECHNIQUE: 5.8 mCi F-18 FDG was injected intravenously. Full-ring PET imaging was performed from the skull base to thigh after the radiotracer. CT data was obtained and used for attenuation correction and anatomic localization. Fasting blood glucose: 97 mg/dl COMPARISON:  Chest CT 03/16/2022.  PET-CT 09/16/2021. FINDINGS: Mediastinal blood pool activity: SUV max 1.9 Liver activity: SUV max NA NECK: No hypermetabolic lymph nodes in the neck. Incidental CT findings: None. CHEST: Supraclavicular and thoracic inlet lymph nodes are hypermetabolic bilaterally. Left-sided nodes demonstrate SUV max = 12.9 which compares to 5.6 previously. Right-sided nodes demonstrating SUV max = 7.4 compared to 8.5 previously. 10 mm short axis right paratracheal node measured on chest CT 03/16/2022 shows low level FDG uptake with SUV max = 2.8 compared to 5.0 on previous PET-CT. 11 mm precarinal node demonstrates SUV max = 14.1 today. No hypermetabolic lymph node at this location on previous PET-CT. Hypermetabolism in the upper right hilar region demonstrates SUV max = 3.9 today compared to 6.1 previously. Incidental CT findings: No pericardial effusion. Centrilobular and paraseptal emphysema evident. Parahilar scarring again noted in the right lung. ABDOMEN/PELVIS: New hypermetabolic lymphadenopathy is identified in the upper abdomen/region of porta hepatis. 13 mm short axis celiac axis node on 133/2 is new since prior PET-CT with SUV max = 8.3. 10 mm short axis pre caval node on 135/2 is new in the interval since prior PET-CT with SUV max = 7.5. Small left para-aortic  nodes on [id number] hypermetabolic with SUV max = 5.0. Focal hypermetabolism identified in the medial right liver corresponding to a subtle 8 mm hypodensity on image 123/2. SUV max = 5.1. No hypermetabolic lymphadenopathy in the pelvis. Incidental CT findings: Gallbladder surgically absent. No adrenal nodule or mass. There is mild atherosclerotic calcification of the abdominal aorta without aneurysm. SKELETON: No focal hypermetabolic activity to suggest skeletal metastasis. Incidental CT findings: None. IMPRESSION: 1. There is persistent hypermetabolic supraclavicular and thoracic inlet lymphadenopathy, some of which shows slightly increased hypermetabolism and some of which shows slightly decreased hypermetabolism. 2. There is a new hypermetabolic precarinal lymph node on today's study consistent with new metastatic involvement. 3. Interval development of hypermetabolic lymphadenopathy in the upper abdomen consistent with new metastatic disease. 4. Small hypermetabolic lesion in the medial right liver is new in the interval, consistent with metastatic disease. 5. Aortic  Atherosclerosis (ICD10-I70.0) and Emphysema (ICD10-J43.9). Electronically Signed   By: Misty Stanley M.D.   On: 03/29/2022 10:06   CT Soft Tissue Neck W Contrast  Result Date: 03/16/2022 CLINICAL DATA:  Neck mass, nonpulsatile. EXAM: CT NECK WITH CONTRAST TECHNIQUE: Multidetector CT imaging of the neck was performed using the standard protocol following the bolus administration of intravenous contrast. RADIATION DOSE REDUCTION: This exam was performed according to the departmental dose-optimization program which includes automated exposure control, adjustment of the mA and/or kV according to patient size and/or use of iterative reconstruction technique. CONTRAST:  80m OMNIPAQUE IOHEXOL 300 MG/ML  SOLN COMPARISON:  Neck CT 01/04/2022. FINDINGS: Pharynx and larynx: Normal. No mass or swelling. Salivary glands: No inflammation, mass, or stone.  Thyroid: Normal. Lymph nodes: Newly enlarged left level 5 B lymph node measuring up to 10 mm in short axis (image 71 series 2). Within the limits of motion artifact, the previously seen left level 4 lymph nodes unchanged, measuring up to 8 mm in short axis (image 71 series 2). Within limits of motion artifact, the right level 4 lymph node is unchanged measuring up to 7 mm in short axis (image 74 series 2). Vascular: Atherosclerotic calcifications of the carotid bulbs. Limited intracranial: Unremarkable. Visualized orbits: Unremarkable. Mastoids and visualized paranasal sinuses: Mucous retention cysts in the right maxillary sinus. Mastoids are well aerated. Skeleton: Mild cervical spondylosis.  No suspicious bone lesions. Upper chest: Increased mediastinal lymphadenopathy. Other: None. IMPRESSION: 1. Newly enlarged left level 5 B lymph node measuring up to 10 mm in short axis, concerning for new metastatic disease. 2. Increased mediastinal lymphadenopathy. 3. Within the limits of motion artifact, the previously seen level 4 lymph nodes are unchanged. Electronically Signed   By: WEmmit AlexandersM.D.   On: 03/16/2022 13:36   CT Chest W Contrast  Result Date: 03/16/2022 CLINICAL DATA:  Non-small cell lung cancer, restaging EXAM: CT CHEST WITH CONTRAST TECHNIQUE: Multidetector CT imaging of the chest was performed during intravenous contrast administration. RADIATION DOSE REDUCTION: This exam was performed according to the departmental dose-optimization program which includes automated exposure control, adjustment of the mA and/or kV according to patient size and/or use of iterative reconstruction technique. CONTRAST:  773mOMNIPAQUE IOHEXOL 300 MG/ML  SOLN COMPARISON:  01/04/2022 FINDINGS: Cardiovascular: The heart is normal in size. No pericardial effusion. No evidence of thoracic aortic aneurysm. Mild coronary atherosclerosis of the LAD. Mediastinum/Nodes: Right supraclavicular nodes measuring up to 10 mm short  axis (series 2/image 13), new. Amorphous nodal soft tissue in the right paratracheal region measuring 10 mm short axis (series 2/image 36), new. 11 mm short axis low right paratracheal/AP window node (series 2/image 47), new. Additional amorphous soft tissue in the right hilar and subcarinal regions (series 2/images 54 in 58), new. This appearance is worrisome for nodal recurrence. Visualized thyroid is unremarkable. Lungs/Pleura: Radiation changes in the central right upper lobe, right perihilar, and right paramediastinal regions, unchanged. No new/suspicious pulmonary nodules. Moderate centrilobular and paraseptal emphysematous changes, upper lung predominant. No focal consolidation. No pleural effusion or pneumothorax. Upper Abdomen: Visualized upper abdomen is notable for vascular calcifications, prior cholecystectomy, again incompletely visualized 11 mm right upper pole renal cyst (series 2/image 47). Musculoskeletal: Visualized osseous structures are within normal limits. IMPRESSION: Radiation changes in the right upper lobe/perihilar region. New thoracic lymphadenopathy, as above, suspicious for nodal recurrence. Emphysema (ICD10-J43.9). Electronically Signed   By: SrJulian Hy.D.   On: 03/16/2022 13:17    ASSESSMENT AND PLAN: This is a very  pleasant 57 years old white female with stage IIIB (T1b, N3, M0) non-small cell lung cancer, adenocarcinoma diagnosed in November 2022 with no actionable mutation and negative PD-L1 expression. The patient completed a course of concurrent chemoradiation with weekly carboplatin for AUC of 2 and paclitaxel 45 Mg/M2 status post 7 cycles.  She has been tolerating her treatment well except for the mild odynophagia and skin burns. She was also recently admitted to the hospital complaining of dysphagia and odynophagia secondary to radiation induced esophagitis.  She is feeling much better but continues to have residual dysphagia.  She is followed by gastroenterology  and was seen by Dr. Ardis Hughs during her hospitalization. Her scan showed improvement of her disease. I recommended for the patient treatment with consolidation immunotherapy with Imfinzi 1500 Mg IV every 4 weeks.  Status post 3 cycles.  Last dose was given in April 2023.  Her treatment was discontinued secondary to suspicious immunotherapy mediated pneumonitis with significant shortness of breath at that time and she was treated with a tapered dose of prednisone. Unfortunately her scan showed evidence for disease recurrence with enlargement of lower right cervical lymph nodes as well as mediastinal lymphadenopathy.  She has palpable right cervical lymphadenopathy. 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 The patient underwent ultrasound-guided core biopsy of the right supraclavicular lymph nodes but the final pathology showed necrotic tumor cells with complete coagulative necrosis with focal fibrous tissue and histiocytic reaction.  She also had MRI of the brain that showed no evidence of metastatic disease to the brain. The patient was seen by Dr. Sondra Come and started palliative radiotherapy to the enlarging right supraclavicular lymphadenopathy.  This was completed on October 28, 2021. The patient was found to have metastatic disease in February 2024 with several lymphadenopathy in the chest as well as supraclavicular, upper abdomen as well as suspicious small liver metastasis. She is currently undergoing systemic chemotherapy with carboplatin for AUC of 5, Alimta 500 Mg/M2 and Keytruda 200 Mg IV every 3 weeks status post 1 cycle.  She tolerated the first cycle of her treatment  well except for abdominal pain and constipation few days after the treatment and recent imaging studies were unremarkable for any acute abnormalities. The patient also had ultrasound-guided core biopsy of a left supraclavicular lymph node yesterday and the final pathology is still pending. I recommended for the patient to continue her treatment as planned and she is expected to start cycle #2 of her treatment in 2 weeks. She will come back for follow-up visit at that time. The patient was advised to call immediately if she has any other concerning symptoms in the interval. The patient voices understanding of current disease status and treatment options and is in agreement with the current care plan.  All questions were answered. The patient knows to call the clinic with any problems, questions or concerns. We can certainly see the patient much sooner if necessary.  The total time spent in the appointment was 30 minutes.  Disclaimer: This note was dictated with voice recognition software. Similar sounding words can inadvertently be transcribed and may not be corrected upon review.

## 2022-04-14 ENCOUNTER — Telehealth: Payer: Self-pay | Admitting: Physician Assistant

## 2022-04-14 LAB — SURGICAL PATHOLOGY

## 2022-04-14 NOTE — Telephone Encounter (Signed)
I called the patient to review the results of her biopsy.  This is consistent with her known non-small cell lung cancer, adenocarcinoma.  I called the patient and relayed this to her.  Therefore the treatment she received last week is the appropriate treatment for her.  She was also wondering about her lab work from yesterday.  Discussed with her that we do expect some myelosuppression/drops in the blood count but all her numbers are as expected and no intervention is needed at this time.  We will continue to monitor this closely on a weekly basis and she is scheduled for her next of labs next Monday on 04/19/2022. She was appreciative of the call.

## 2022-04-15 ENCOUNTER — Telehealth: Payer: Self-pay | Admitting: Internal Medicine

## 2022-04-15 NOTE — Telephone Encounter (Signed)
Called patient regarding upcoming March - April appointments, patient is notified. 

## 2022-04-19 ENCOUNTER — Inpatient Hospital Stay (HOSPITAL_BASED_OUTPATIENT_CLINIC_OR_DEPARTMENT_OTHER): Payer: Commercial Managed Care - PPO | Admitting: Physician Assistant

## 2022-04-19 ENCOUNTER — Inpatient Hospital Stay: Payer: Commercial Managed Care - PPO

## 2022-04-19 ENCOUNTER — Telehealth: Payer: Self-pay | Admitting: Internal Medicine

## 2022-04-19 ENCOUNTER — Other Ambulatory Visit: Payer: Self-pay

## 2022-04-19 ENCOUNTER — Other Ambulatory Visit: Payer: Self-pay | Admitting: Physician Assistant

## 2022-04-19 ENCOUNTER — Telehealth: Payer: Self-pay | Admitting: Medical Oncology

## 2022-04-19 ENCOUNTER — Telehealth: Payer: Self-pay

## 2022-04-19 VITALS — BP 121/69 | HR 88 | Temp 97.7°F | Resp 16 | Wt 115.5 lb

## 2022-04-19 DIAGNOSIS — D701 Agranulocytosis secondary to cancer chemotherapy: Secondary | ICD-10-CM

## 2022-04-19 DIAGNOSIS — R21 Rash and other nonspecific skin eruption: Secondary | ICD-10-CM | POA: Diagnosis not present

## 2022-04-19 DIAGNOSIS — C3491 Malignant neoplasm of unspecified part of right bronchus or lung: Secondary | ICD-10-CM | POA: Diagnosis not present

## 2022-04-19 DIAGNOSIS — D61810 Antineoplastic chemotherapy induced pancytopenia: Secondary | ICD-10-CM | POA: Diagnosis not present

## 2022-04-19 DIAGNOSIS — T451X5A Adverse effect of antineoplastic and immunosuppressive drugs, initial encounter: Secondary | ICD-10-CM | POA: Diagnosis not present

## 2022-04-19 DIAGNOSIS — Z5112 Encounter for antineoplastic immunotherapy: Secondary | ICD-10-CM | POA: Diagnosis not present

## 2022-04-19 DIAGNOSIS — D709 Neutropenia, unspecified: Secondary | ICD-10-CM

## 2022-04-19 DIAGNOSIS — C3411 Malignant neoplasm of upper lobe, right bronchus or lung: Secondary | ICD-10-CM

## 2022-04-19 HISTORY — DX: Neutropenia, unspecified: D70.9

## 2022-04-19 LAB — CMP (CANCER CENTER ONLY)
ALT: 53 U/L — ABNORMAL HIGH (ref 0–44)
AST: 36 U/L (ref 15–41)
Albumin: 3.8 g/dL (ref 3.5–5.0)
Alkaline Phosphatase: 111 U/L (ref 38–126)
Anion gap: 6 (ref 5–15)
BUN: 14 mg/dL (ref 6–20)
CO2: 28 mmol/L (ref 22–32)
Calcium: 9.6 mg/dL (ref 8.9–10.3)
Chloride: 107 mmol/L (ref 98–111)
Creatinine: 0.75 mg/dL (ref 0.44–1.00)
GFR, Estimated: >60 mL/min (ref >60)
Glucose, Bld: 105 mg/dL — ABNORMAL HIGH (ref 70–99)
Potassium: 4.1 mmol/L (ref 3.5–5.1)
Sodium: 141 mmol/L (ref 135–145)
Total Bilirubin: 0.3 mg/dL (ref 0.3–1.2)
Total Protein: 7.1 g/dL (ref 6.5–8.1)

## 2022-04-19 LAB — CBC WITH DIFFERENTIAL (CANCER CENTER ONLY)
Abs Immature Granulocytes: 0 10*3/uL (ref 0.00–0.07)
Basophils Absolute: 0 10*3/uL (ref 0.0–0.1)
Basophils Relative: 0 %
Eosinophils Absolute: 0 10*3/uL (ref 0.0–0.5)
Eosinophils Relative: 2 %
HCT: 30.3 % — ABNORMAL LOW (ref 36.0–46.0)
Hemoglobin: 10.7 g/dL — ABNORMAL LOW (ref 12.0–15.0)
Immature Granulocytes: 0 %
Lymphocytes Relative: 52 %
Lymphs Abs: 0.6 10*3/uL — ABNORMAL LOW (ref 0.7–4.0)
MCH: 33 pg (ref 26.0–34.0)
MCHC: 35.3 g/dL (ref 30.0–36.0)
MCV: 93.5 fL (ref 80.0–100.0)
Monocytes Absolute: 0.2 10*3/uL (ref 0.1–1.0)
Monocytes Relative: 18 %
Neutro Abs: 0.3 10*3/uL — CL (ref 1.7–7.7)
Neutrophils Relative %: 28 %
Platelet Count: 59 10*3/uL — ABNORMAL LOW (ref 150–400)
RBC: 3.24 MIL/uL — ABNORMAL LOW (ref 3.87–5.11)
RDW: 11.4 % — ABNORMAL LOW (ref 11.5–15.5)
Smear Review: NORMAL
WBC Count: 1.2 10*3/uL — ABNORMAL LOW (ref 4.0–10.5)
nRBC: 0 % (ref 0.0–0.2)

## 2022-04-19 MED ORDER — HYDROCORTISONE 1 % EX OINT: 1.0000 | TOPICAL_OINTMENT | Freq: Two times a day (BID) | CUTANEOUS | 0 refills | Status: DC

## 2022-04-19 NOTE — Telephone Encounter (Signed)
Pt notified of need for 3 days of zarixo.

## 2022-04-19 NOTE — Telephone Encounter (Signed)
Scheduled per 03/18 scheduling message, patient has been called and notified.

## 2022-04-19 NOTE — Telephone Encounter (Signed)
CRITICAL VALUE STICKER  CRITICAL VALUE:   ANC 0.3  RECEIVER (on-site recipient of call):  Bonnita Nasuti  DATE & TIME NOTIFIED: 04/19/22  MESSENGER (representative from lab):    MD NOTIFIED: Sherol Dade, PA  TIME OF NOTIFICATION:  09:47  RESPONSE:

## 2022-04-19 NOTE — Progress Notes (Signed)
Symptom Management Consult Note [place]    Patient Care Team: [name], NP as PCP - General    Name / MRN / DOB: Julie Nicholson  [id number]  06-Apr-1965   Date of visit: 04/19/2022   Chief Complaint/Reason for visit: rash   Current Therapy: Carboplatin, Beryle Flock, alimta   Last treatment:  Day 1   Cycle 1 on 04/05/22   ASSESSMENT & PLAN: Patient is a 57 y.o. female  with oncologic history of Recurrent/metastatic non-small cell lung cancer initially diagnosed as stage IIIB  (T1b, N3, M0) non-small cell lung cancer, adenocarcinoma followed by Dr. Julien Nordmann.  I have viewed most recent oncology note and lab work.    #Recurrent/metastatic non-small cell lung cancer initially diagnosed as stage IIIB  (T1b, N3, M0) non-small cell lung cancer, adenocarcinoma  - Next appointment with oncologist is 04/27/22   #Pancytopenia - Patient here for routine labs today. CBC with pancytopenia. WBC 1.2, HgB 10.7, platelets 59k, ANC 0.3. - Patient is afebrile, HDS. Discussed neutropenic and bleeding precautions with patient.  -  Discussed the severity of neutropenia with oncology team who plans to order Zarxio injection and is currently working on prior authorization.   -Rash -Minor, on bilateral lower extremities. Not consistent with SJS or TENS. - Could be adverse reaction to immunotherapy although is very mild at this time. Will treat with hydrocortisone ointment to affected area. Patient aware if rash worsens she will need to RTC for evaluation and possible steroid injection.  Strict ED precautions discussed should symptoms worsen.    Heme/Onc History: Oncology History  Adenocarcinoma of right lung, stage 4 ([place])  12/18/2020 Initial Diagnosis   Primary adenocarcinoma of upper lobe of right lung ([place])   01/05/2021 - 02/16/2021 Chemotherapy   Patient is on Treatment Plan : LUNG Carboplatin / Paclitaxel + XRT q7d     02/09/2021 Cancer Staging   Staging form:  Lung, AJCC 8th Edition - Clinical: Stage IIIB (cT1b, cN3, cM0) - Signed by Curt Bears, MD on 02/09/2021   03/25/2021 - 05/20/2021 Chemotherapy   Patient is on Treatment Plan : LUNG NSCLC Durvalumab q28d     04/05/2022 -  Chemotherapy   Patient is on Treatment Plan : LUNG Carboplatin (5) + Pemetrexed (500) + Pembrolizumab (200) D1 q21d Induction x 4 cycles / Maintenance Pemetrexed (500) + Pembrolizumab (200) D1 q21d         Interval history-: Julie Nicholson is a 57 y.o. female with oncologic history as above presenting to [facility] today with chief complaint of rash She presents unaccompanied to clinic today.   Patient here today for routine labs. She noticed a rash on her legs yesterday after getting out of the shower. There was mild itching associated with it that resolved after applying Eucerin cream. She denies any new medications or lotions. She did recently start treatment after being found to have recurrence. First treatment was 04/05/22. She reports tolerating treatment well overall. She denies history of similar rash. She denies any oral lesions. She takes Claritin daily. She denies any fever or chills. She also denies easy bruising or bleeding.  ROS  All other systems are reviewed and are negative for acute change except as noted in the HPI.    Allergies  Allergen Reactions   Benadryl [Diphenhydramine] Other (See Comments)    Oversedation - 50 mg IV given 01/05/21; switched to IV Certirizine.     Past Medical History:  Diagnosis Date   Abdominal discomfort  Cancer [facility])    Chronic headaches    due to allergies, sinus   COPD (chronic obstructive pulmonary disease) ([place])    per 2012 chest xray   pt states she doesn not have this now (04/10/2013)   Deviated nasal septum    Eustachian tube dysfunction    GERD (gastroesophageal reflux disease)    occasional uses Tums / Rolaids   Hearing loss    right ear   High cholesterol    History of radiation therapy    right lung  01/07/2021-02/19/2021  Dr Gery Pray   Migraine    "only once in a blue moon since RX'd allergy shots" (04/10/2013)   Pancreatitis 02/08/2013   Pneumonia    Rhinitis, allergic      Past Surgical History:  Procedure Laterality Date   ABDOMINAL HYSTERECTOMY  1995   tx endometriosis, both ovaries removed   APPENDECTOMY  late 1990's   BIOPSY  02/26/2021   Procedure: BIOPSY;  Surgeon: Milus Banister, MD;  Location: [facility];  Service: Endoscopy;;   BRONCHIAL BRUSHINGS  12/15/2020   Procedure: BRONCHIAL BRUSHINGS;  Surgeon: Collene Gobble, MD;  Location: [place];  Service: Cardiopulmonary;;   BRONCHIAL NEEDLE ASPIRATION BIOPSY  12/15/2020   Procedure: BRONCHIAL NEEDLE ASPIRATION BIOPSIES;  Surgeon: Collene Gobble, MD;  Location: [place];  Service: Cardiopulmonary;;   CHOLECYSTECTOMY  04/10/2013   CHOLECYSTECTOMY N/A 04/10/2013   Procedure: LAPAROSCOPIC CHOLECYSTECTOMY WITH INTRAOPERATIVE CHOLANGIOGRAM;  Surgeon: Odis Hollingshead, MD;  Location: [place];  Service: General;  Laterality: N/A;   ELECTROMAGNETIC NAVIGATION BROCHOSCOPY  12/15/2020   Procedure: ELECTROMAGNETIC NAVIGATION BRONCHOSCOPY;  Surgeon: Collene Gobble, MD;  Location: [facility] ENDOSCOPY;  Service: Cardiopulmonary;;   ESOPHAGOGASTRODUODENOSCOPY (EGD) WITH PROPOFOL N/A 02/26/2021   Procedure: ESOPHAGOGASTRODUODENOSCOPY (EGD) WITH PROPOFOL;  Surgeon: Milus Banister, MD;  Location: [facility];  Service: Endoscopy;  Laterality: N/A;   EUS N/A 02/16/2013   Procedure: UPPER ENDOSCOPIC ULTRASOUND (EUS) LINEAR;  Surgeon: Milus Banister, MD;  Location: [facility];  Service: Endoscopy;  Laterality: N/A;  radial linear   KNEE ARTHROSCOPY Right 1980's   "cartilage OR"   LAPAROSCOPIC ENDOMETRIOSIS FULGURATION  1980's   MYRINGOTOMY WITH TUBE PLACEMENT Right 07/13/2018   Procedure: MYRINGOTOMY WITH TUBE PLACEMENT;  Surgeon: Margaretha Sheffield, MD;  Location: [place];  Service: ENT;  Laterality: Right;   MYRINGOTOMY  WITH TUBE PLACEMENT Right 06/04/2021   Procedure: MYRINGOTOMY WITH BUTTERFLY TUBE PLACEMENT;  Surgeon: Margaretha Sheffield, MD;  Location: [place];  Service: ENT;  Laterality: Right;   NASOPHARYNGOSCOPY EUSTATION TUBE BALLOON DILATION Right 07/13/2018   Procedure: NASOPHARYNGOSCOPY EUSTATION TUBE BALLOON DILATION;  Surgeon: Margaretha Sheffield, MD;  Location: [place];  Service: ENT;  Laterality: Right;   TONSILLECTOMY AND ADENOIDECTOMY  ~ 1980   adenoidectomy   TUBAL LIGATION  ~ [place] Right 07/13/2018   Procedure: OUTFRACTURE TURBINATE;  Surgeon: Margaretha Sheffield, MD;  Location: [place];  Service: ENT;  Laterality: Right;   VIDEO BRONCHOSCOPY WITH ENDOBRONCHIAL ULTRASOUND N/A 12/15/2020   Procedure: ROBOTIC VIDEO BRONCHOSCOPY WITH ENDOBRONCHIAL ULTRASOUND;  Surgeon: Collene Gobble, MD;  Location: [facility];  Service: Cardiopulmonary;  Laterality: N/A;   WRIST SURGERY Left    w/plate    Social History   Socioeconomic History   Marital status: Married    Spouse name: Not on file   Number of children: Not on file   Years of education: 12   Highest education level: 12th grade  Occupational History   Not on file  Tobacco Use   Smoking status: Former    Packs/day: 0.50    Years: 41.00    Additional pack years: 0.00    Total pack years: 20.50    Types: Cigarettes    Quit date: 12/23/2020    Years since quitting: 1.3   Smokeless tobacco: Never  Vaping Use   Vaping Use: Never used  Substance and Sexual Activity   Alcohol use: Not Currently    Comment: occasional   Drug use: No   Sexual activity: Yes    Birth control/protection: Surgical    Comment: Hysterectomy  Other Topics Concern   Not on file  Social History Narrative   Not on file   Social Determinants of Health   Financial Resource Strain: Not on file  Food Insecurity: Not on file  Transportation Needs: Not on file  Physical Activity: Not on file  Stress: Not on file   Social Connections: Not on file  Intimate Partner Violence: Not on file    Family History  Problem Relation Age of Onset   Diabetes Father    Diabetes Mother    COPD Mother    COPD Maternal Grandfather    COPD Maternal Aunt    COPD Maternal Uncle      Current Outpatient Medications:    hydrocortisone 1 % ointment, Apply 1 Application topically 2 (two) times daily., Disp: 30 g, Rfl: 0   acetaminophen (TYLENOL) 500 MG tablet, Take 1,000 mg by mouth every 6 (six) hours as needed for moderate pain., Disp: , Rfl:    albuterol (PROVENTIL) (2.5 MG/3ML) 0.083% nebulizer solution, albuterol sulfate 2.5 mg/3 mL (0.083 %) solution for nebulization  USE 1 VIAL IN NEBULIZER EVERY 6 HOURS AS NEEDED FOR WHEEZING OR SHORTNESS OF BREATH, Disp: , Rfl:    aspirin EC 81 MG tablet, Take 81 mg by mouth at bedtime., Disp: , Rfl:    atorvastatin (LIPITOR) 40 MG tablet, Take 40 mg by mouth at bedtime., Disp: , Rfl:    benzonatate (TESSALON) 200 MG capsule, Take 200 mg by mouth 3 (three) times daily as needed. (Patient not taking: Reported on 04/13/2022), Disp: , Rfl:    Biotin 5000 MCG TABS, Take 5,000 mcg by mouth at bedtime., Disp: , Rfl:    buPROPion (WELLBUTRIN XL) 150 MG 24 hr tablet, Take 150 mg by mouth every morning., Disp: , Rfl:    Ca Carbonate-Mag Hydroxide (ROLAIDS PO), Take 1 tablet by mouth daily as needed (heartburn)., Disp: , Rfl:    famotidine (PEPCID) 40 MG tablet, Take 40 mg by mouth daily., Disp: , Rfl:    fluticasone (FLONASE) 50 MCG/ACT nasal spray, Place 2 sprays into both nostrils daily as needed for allergies., Disp: , Rfl:    folic acid (FOLVITE) 1 MG tablet, Take 1 tablet (1 mg total) by mouth daily., Disp: 30 tablet, Rfl: 2   linaCLOtide (LINZESS PO), Take by mouth., Disp: , Rfl:    loratadine (CLARITIN) 10 MG tablet, TAKE 1 TABLET BY MOUTH EVERY DAY, Disp: 90 tablet, Rfl: 1   Multiple Vitamins-Minerals (HAIR SKIN AND NAILS FORMULA PO), Take 1 tablet by mouth daily., Disp: , Rfl:     ondansetron (ZOFRAN-ODT) 8 MG disintegrating tablet, Take 8 mg by mouth 3 (three) times daily as needed. (Patient not taking: Reported on 04/13/2022), Disp: , Rfl:    Oxycodone HCl 10 MG TABS, Take 1 tablet by mouth 3 (three) times daily as needed (pain). (Patient not taking:  Reported on 04/13/2022), Disp: , Rfl:    prochlorperazine (COMPAZINE) 10 MG tablet, Take 1 tablet (10 mg total) by mouth every 6 (six) hours as needed. (Patient not taking: Reported on 04/13/2022), Disp: 30 tablet, Rfl: 2   rizatriptan (MAXALT-MLT) 10 MG disintegrating tablet, Take 10 mg by mouth as needed for migraine. May repeat in 2 hours if needed, Disp: , Rfl:    sertraline (ZOLOFT) 50 MG tablet, Take 1 tablet by mouth daily., Disp: , Rfl:    Tiotropium Bromide-Olodaterol (STIOLTO RESPIMAT) 2.5-2.5 MCG/ACT AERS, Inhale 2 puffs into the lungs daily., Disp: 4 g, Rfl: 3  PHYSICAL EXAM: ECOG FS:1 - Symptomatic but completely ambulatory    Vitals:   04/19/22 0859  BP: 121/69  Pulse: 88  Resp: 16  Temp: 97.7 F (36.5 C)  TempSrc: Oral  SpO2: 100%  Weight: 115 lb 8 oz (52.4 kg)   Physical Exam Vitals and nursing note reviewed.  Constitutional:      Appearance: She is well-developed. She is not ill-appearing or toxic-appearing.  HENT:     Head: Normocephalic.     Nose: Nose normal.     Mouth/Throat:     Comments: No oral lesions Eyes:     Conjunctiva/sclera: Conjunctivae normal.  Neck:     Vascular: No JVD.  Cardiovascular:     Rate and Rhythm: Normal rate and regular rhythm.     Pulses: Normal pulses.     Heart sounds: Normal heart sounds.  Pulmonary:     Effort: Pulmonary effort is normal.     Breath sounds: Normal breath sounds.  Abdominal:     General: There is no distension.  Musculoskeletal:     Cervical back: Normal range of motion.  Skin:    General: Skin is warm and dry.     Findings: Rash present.     Comments: Equal tactile temperature in bilateral lower extremities. Please see media  below.  Neurological:     Mental Status: She is oriented to person, place, and time.        LABORATORY DATA: I have reviewed the data as listed    Latest Ref Rng & Units 04/19/2022    8:37 AM 04/13/2022    8:40 AM 04/12/2022   12:10 PM  CBC  WBC 4.0 - 10.5 K/uL 1.2  2.4  2.0   Hemoglobin 12.0 - 15.0 g/dL 10.7  11.6  11.8   Hematocrit 36.0 - 46.0 % 30.3  34.0  35.0   Platelets 150 - 400 K/uL 59  133  167         Latest Ref Rng & Units 04/19/2022    8:37 AM 04/13/2022    8:40 AM 04/12/2022   12:10 PM  CMP  Glucose 70 - 99 mg/dL 105  109  96   BUN 6 - 20 mg/dL 14  16  13    Creatinine 0.44 - 1.00 mg/dL 0.75  0.85  0.74   Sodium 135 - 145 mmol/L 141  141  138   Potassium 3.5 - 5.1 mmol/L 4.1  3.8  4.1   Chloride 98 - 111 mmol/L 107  106  106   CO2 22 - 32 mmol/L 28  27  26    Calcium 8.9 - 10.3 mg/dL 9.6  9.7  9.0   Total Protein 6.5 - 8.1 g/dL 7.1  7.4    Total Bilirubin 0.3 - 1.2 mg/dL 0.3  0.3    Alkaline Phos 38 - 126 U/L 111  121  AST 15 - 41 U/L 36  23    ALT 0 - 44 U/L 53  25         RADIOGRAPHIC STUDIES (from last 24 hours if applicable) I have personally reviewed the radiological images as listed and agreed with the findings in the report. No results found.      Visit Diagnosis: 1. Antineoplastic chemotherapy induced pancytopenia ([place])   2. Rash   3. Adenocarcinoma of right lung, stage 4 ([facility])      No orders of the defined types were placed in this encounter.   All questions were answered. The patient knows to call the clinic with any problems, questions or concerns. No barriers to learning was detected.  I have spent a total of 30 minutes minutes of face-to-face and non-face-to-face time, preparing to see the patient, obtaining and/or reviewing separately obtained history, performing a medically appropriate examination, counseling and educating the patient, ordering medications, documenting clinical information in the electronic health record, and  care coordination (communications with other health care professionals or caregivers).    Thank you for allowing me to participate in the care of this patient.    Barrie Folk, PA-C Department of Hematology/Oncology [facility] at [facility] Phone: [phone]  [phone]    04/19/2022 10:40 AM

## 2022-04-20 ENCOUNTER — Inpatient Hospital Stay: Payer: Commercial Managed Care - PPO

## 2022-04-20 VITALS — BP 123/63 | HR 93 | Temp 98.4°F | Resp 16

## 2022-04-20 DIAGNOSIS — T451X5A Adverse effect of antineoplastic and immunosuppressive drugs, initial encounter: Secondary | ICD-10-CM

## 2022-04-20 DIAGNOSIS — Z5112 Encounter for antineoplastic immunotherapy: Secondary | ICD-10-CM | POA: Diagnosis not present

## 2022-04-20 MED ORDER — FILGRASTIM-SNDZ 300 MCG/0.5ML IJ SOSY
300.0000 ug | PREFILLED_SYRINGE | Freq: Once | INTRAMUSCULAR | Status: AC
  Administered 2022-04-20: 300 ug via SUBCUTANEOUS
  Filled 2022-04-20: qty 0.5

## 2022-04-20 NOTE — Patient Instructions (Signed)
Filgrastim Injection What is this medication? FILGRASTIM (fil GRA stim) lowers the risk of infection in people who are receiving chemotherapy. It works by helping your body make more white blood cells, which protects your body from infection. It may also be used to help people who have been exposed to high doses of radiation. It can be used to help prepare your body before a stem cell transplant. It works by helping your bone marrow make and release stem cells into the blood. This medicine may be used for other purposes; ask your health care provider or pharmacist if you have questions. COMMON BRAND NAME(S): Neupogen, Nivestym, Releuko, Zarxio What should I tell my care team before I take this medication? They need to know if you have any of these conditions: History of blood diseases, such as sickle cell anemia Kidney disease Recent or ongoing radiation An unusual or allergic reaction to filgrastim, pegfilgrastim, latex, rubber, other medications, foods, dyes, or preservatives Pregnant or trying to get pregnant Breast-feeding How should I use this medication? This medication is injected under the skin or into a vein. It is usually given by your care team in a hospital or clinic setting. It may be given at home. If you get this medication at home, you will be taught how to prepare and give it. Use exactly as directed. Take it as directed on the prescription label at the same time every day. Keep taking it unless your care team tells you to stop. It is important that you put your used needles and syringes in a special sharps container. Do not put them in a trash can. If you do not have a sharps container, call your pharmacist or care team to get one. This medication comes with INSTRUCTIONS FOR USE. Ask your pharmacist for directions on how to use this medication. Read the information carefully. Talk to your pharmacist or care team if you have questions. Talk to your care team about the use of this  medication in children. While it may be prescribed for children for selected conditions, precautions do apply. Overdosage: If you think you have taken too much of this medicine contact a poison control center or emergency room at once. NOTE: This medicine is only for you. Do not share this medicine with others. What if I miss a dose? It is important not to miss any doses. Talk to your care team about what to do if you miss a dose. What may interact with this medication? Medications that may cause a release of neutrophils, such as lithium This list may not describe all possible interactions. Give your health care provider a list of all the medicines, herbs, non-prescription drugs, or dietary supplements you use. Also tell them if you smoke, drink alcohol, or use illegal drugs. Some items may interact with your medicine. What should I watch for while using this medication? Your condition will be monitored carefully while you are receiving this medication. You may need bloodwork while taking this medication. Talk to your care team about your risk of cancer. You may be more at risk for certain types of cancer if you take this medication. What side effects may I notice from receiving this medication? Side effects that you should report to your care team as soon as possible: Allergic reactions--skin rash, itching, hives, swelling of the face, lips, tongue, or throat Capillary leak syndrome--stomach or muscle pain, unusual weakness or fatigue, feeling faint or lightheaded, decrease in the amount of urine, swelling of the ankles, hands, or   feet, trouble breathing High white blood cell level--fever, fatigue, trouble breathing, night sweats, change in vision, weight loss Inflammation of the aorta--fever, fatigue, back, chest, or stomach pain, severe headache Kidney injury (glomerulonephritis)--decrease in the amount of urine, red or dark brown urine, foamy or bubbly urine, swelling of the ankles, hands, or  feet Shortness of breath or trouble breathing Spleen injury--pain in upper left stomach or shoulder Unusual bruising or bleeding Side effects that usually do not require medical attention (report to your care team if they continue or are bothersome): Back pain Bone pain Fatigue Fever Headache Nausea This list may not describe all possible side effects. Call your doctor for medical advice about side effects. You may report side effects to FDA at 1-800-FDA-1088. Where should I keep my medication? Keep out of the reach of children and pets. Keep this medication in the original packaging until you are ready to take it. Protect from light. See product for storage information. Each product may have different instructions. Get rid of any unused medication after the expiration date. To get rid of medications that are no longer needed or have expired: Take the medication to a medications take-back program. Check with your pharmacy or law enforcement to find a location. If you cannot return the medication, ask your pharmacist or care team how to get rid of this medication safely. NOTE: This sheet is a summary. It may not cover all possible information. If you have questions about this medicine, talk to your doctor, pharmacist, or health care provider.  2023 Elsevier/Gold Standard (2021-04-28 00:00:00)  

## 2022-04-21 ENCOUNTER — Inpatient Hospital Stay: Payer: Commercial Managed Care - PPO

## 2022-04-21 ENCOUNTER — Other Ambulatory Visit: Payer: Self-pay

## 2022-04-21 VITALS — BP 116/72 | HR 99 | Temp 98.4°F | Resp 16

## 2022-04-21 DIAGNOSIS — Z5112 Encounter for antineoplastic immunotherapy: Secondary | ICD-10-CM | POA: Diagnosis not present

## 2022-04-21 DIAGNOSIS — T451X5A Adverse effect of antineoplastic and immunosuppressive drugs, initial encounter: Secondary | ICD-10-CM

## 2022-04-21 MED ORDER — FILGRASTIM-SNDZ 300 MCG/0.5ML IJ SOSY
300.0000 ug | PREFILLED_SYRINGE | Freq: Once | INTRAMUSCULAR | Status: AC
  Administered 2022-04-21: 300 ug via SUBCUTANEOUS
  Filled 2022-04-21: qty 0.5

## 2022-04-21 NOTE — Patient Instructions (Signed)
Filgrastim Injection What is this medication? FILGRASTIM (fil GRA stim) lowers the risk of infection in people who are receiving chemotherapy. It works by helping your body make more white blood cells, which protects your body from infection. It may also be used to help people who have been exposed to high doses of radiation. It can be used to help prepare your body before a stem cell transplant. It works by helping your bone marrow make and release stem cells into the blood. This medicine may be used for other purposes; ask your health care provider or pharmacist if you have questions. COMMON BRAND NAME(S): Neupogen, Nivestym, Releuko, Zarxio What should I tell my care team before I take this medication? They need to know if you have any of these conditions: History of blood diseases, such as sickle cell anemia Kidney disease Recent or ongoing radiation An unusual or allergic reaction to filgrastim, pegfilgrastim, latex, rubber, other medications, foods, dyes, or preservatives Pregnant or trying to get pregnant Breast-feeding How should I use this medication? This medication is injected under the skin or into a vein. It is usually given by your care team in a hospital or clinic setting. It may be given at home. If you get this medication at home, you will be taught how to prepare and give it. Use exactly as directed. Take it as directed on the prescription label at the same time every day. Keep taking it unless your care team tells you to stop. It is important that you put your used needles and syringes in a special sharps container. Do not put them in a trash can. If you do not have a sharps container, call your pharmacist or care team to get one. This medication comes with INSTRUCTIONS FOR USE. Ask your pharmacist for directions on how to use this medication. Read the information carefully. Talk to your pharmacist or care team if you have questions. Talk to your care team about the use of this  medication in children. While it may be prescribed for children for selected conditions, precautions do apply. Overdosage: If you think you have taken too much of this medicine contact a poison control center or emergency room at once. NOTE: This medicine is only for you. Do not share this medicine with others. What if I miss a dose? It is important not to miss any doses. Talk to your care team about what to do if you miss a dose. What may interact with this medication? Medications that may cause a release of neutrophils, such as lithium This list may not describe all possible interactions. Give your health care provider a list of all the medicines, herbs, non-prescription drugs, or dietary supplements you use. Also tell them if you smoke, drink alcohol, or use illegal drugs. Some items may interact with your medicine. What should I watch for while using this medication? Your condition will be monitored carefully while you are receiving this medication. You may need bloodwork while taking this medication. Talk to your care team about your risk of cancer. You may be more at risk for certain types of cancer if you take this medication. What side effects may I notice from receiving this medication? Side effects that you should report to your care team as soon as possible: Allergic reactions--skin rash, itching, hives, swelling of the face, lips, tongue, or throat Capillary leak syndrome--stomach or muscle pain, unusual weakness or fatigue, feeling faint or lightheaded, decrease in the amount of urine, swelling of the ankles, hands, or   feet, trouble breathing High white blood cell level--fever, fatigue, trouble breathing, night sweats, change in vision, weight loss Inflammation of the aorta--fever, fatigue, back, chest, or stomach pain, severe headache Kidney injury (glomerulonephritis)--decrease in the amount of urine, red or dark brown urine, foamy or bubbly urine, swelling of the ankles, hands, or  feet Shortness of breath or trouble breathing Spleen injury--pain in upper left stomach or shoulder Unusual bruising or bleeding Side effects that usually do not require medical attention (report to your care team if they continue or are bothersome): Back pain Bone pain Fatigue Fever Headache Nausea This list may not describe all possible side effects. Call your doctor for medical advice about side effects. You may report side effects to FDA at 1-800-FDA-1088. Where should I keep my medication? Keep out of the reach of children and pets. Keep this medication in the original packaging until you are ready to take it. Protect from light. See product for storage information. Each product may have different instructions. Get rid of any unused medication after the expiration date. To get rid of medications that are no longer needed or have expired: Take the medication to a medications take-back program. Check with your pharmacy or law enforcement to find a location. If you cannot return the medication, ask your pharmacist or care team how to get rid of this medication safely. NOTE: This sheet is a summary. It may not cover all possible information. If you have questions about this medicine, talk to your doctor, pharmacist, or health care provider.  2023 Elsevier/Gold Standard (2021-04-28 00:00:00)  

## 2022-04-22 ENCOUNTER — Inpatient Hospital Stay: Payer: Commercial Managed Care - PPO

## 2022-04-22 VITALS — BP 108/66 | HR 106 | Temp 98.4°F

## 2022-04-22 DIAGNOSIS — Z5112 Encounter for antineoplastic immunotherapy: Secondary | ICD-10-CM | POA: Diagnosis not present

## 2022-04-22 DIAGNOSIS — T451X5A Adverse effect of antineoplastic and immunosuppressive drugs, initial encounter: Secondary | ICD-10-CM

## 2022-04-22 MED ORDER — FILGRASTIM-SNDZ 300 MCG/0.5ML IJ SOSY
300.0000 ug | PREFILLED_SYRINGE | Freq: Once | INTRAMUSCULAR | Status: AC
  Administered 2022-04-22: 300 ug via SUBCUTANEOUS
  Filled 2022-04-22: qty 0.5

## 2022-04-26 ENCOUNTER — Other Ambulatory Visit: Payer: Commercial Managed Care - PPO

## 2022-04-26 MED FILL — Fosaprepitant Dimeglumine For IV Infusion 150 MG (Base Eq): INTRAVENOUS | Qty: 5 | Status: AC

## 2022-04-26 MED FILL — Dexamethasone Sodium Phosphate Inj 100 MG/10ML: INTRAMUSCULAR | Qty: 1 | Status: AC

## 2022-04-27 ENCOUNTER — Other Ambulatory Visit: Payer: Self-pay

## 2022-04-27 ENCOUNTER — Inpatient Hospital Stay: Payer: Commercial Managed Care - PPO

## 2022-04-27 ENCOUNTER — Inpatient Hospital Stay (HOSPITAL_BASED_OUTPATIENT_CLINIC_OR_DEPARTMENT_OTHER): Payer: Commercial Managed Care - PPO | Admitting: Internal Medicine

## 2022-04-27 ENCOUNTER — Encounter: Payer: Self-pay | Admitting: Internal Medicine

## 2022-04-27 VITALS — BP 106/64 | HR 91 | Temp 98.2°F | Resp 18

## 2022-04-27 DIAGNOSIS — C3491 Malignant neoplasm of unspecified part of right bronchus or lung: Secondary | ICD-10-CM | POA: Diagnosis not present

## 2022-04-27 DIAGNOSIS — Z5112 Encounter for antineoplastic immunotherapy: Secondary | ICD-10-CM | POA: Diagnosis not present

## 2022-04-27 LAB — CBC WITH DIFFERENTIAL (CANCER CENTER ONLY)
Abs Immature Granulocytes: 0.03 10*3/uL (ref 0.00–0.07)
Basophils Absolute: 0 10*3/uL (ref 0.0–0.1)
Basophils Relative: 0 %
Eosinophils Absolute: 0 10*3/uL (ref 0.0–0.5)
Eosinophils Relative: 1 %
HCT: 34.8 % — ABNORMAL LOW (ref 36.0–46.0)
Hemoglobin: 12 g/dL (ref 12.0–15.0)
Immature Granulocytes: 1 %
Lymphocytes Relative: 20 %
Lymphs Abs: 0.8 10*3/uL (ref 0.7–4.0)
MCH: 33.3 pg (ref 26.0–34.0)
MCHC: 34.5 g/dL (ref 30.0–36.0)
MCV: 96.7 fL (ref 80.0–100.0)
Monocytes Absolute: 0.4 10*3/uL (ref 0.1–1.0)
Monocytes Relative: 12 %
Neutro Abs: 2.4 10*3/uL (ref 1.7–7.7)
Neutrophils Relative %: 66 %
Platelet Count: 147 10*3/uL — ABNORMAL LOW (ref 150–400)
RBC: 3.6 MIL/uL — ABNORMAL LOW (ref 3.87–5.11)
RDW: 14.1 % (ref 11.5–15.5)
WBC Count: 3.7 10*3/uL — ABNORMAL LOW (ref 4.0–10.5)
nRBC: 0 % (ref 0.0–0.2)

## 2022-04-27 LAB — CMP (CANCER CENTER ONLY)
ALT: 29 U/L (ref 0–44)
AST: 20 U/L (ref 15–41)
Albumin: 4.4 g/dL (ref 3.5–5.0)
Alkaline Phosphatase: 119 U/L (ref 38–126)
Anion gap: 8 (ref 5–15)
BUN: 18 mg/dL (ref 6–20)
CO2: 26 mmol/L (ref 22–32)
Calcium: 9.8 mg/dL (ref 8.9–10.3)
Chloride: 106 mmol/L (ref 98–111)
Creatinine: 0.89 mg/dL (ref 0.44–1.00)
GFR, Estimated: >60 mL/min (ref >60)
Glucose, Bld: 116 mg/dL — ABNORMAL HIGH (ref 70–99)
Potassium: 3.5 mmol/L (ref 3.5–5.1)
Sodium: 140 mmol/L (ref 135–145)
Total Bilirubin: 0.5 mg/dL (ref 0.3–1.2)
Total Protein: 7.6 g/dL (ref 6.5–8.1)

## 2022-04-27 MED ORDER — PALONOSETRON HCL INJECTION 0.25 MG/5ML
0.2500 mg | Freq: Once | INTRAVENOUS | Status: AC
  Administered 2022-04-27: 0.25 mg via INTRAVENOUS
  Filled 2022-04-27: qty 5

## 2022-04-27 MED ORDER — SODIUM CHLORIDE 0.9 % IV SOLN: Freq: Once | INTRAVENOUS | Status: AC

## 2022-04-27 MED ORDER — SODIUM CHLORIDE 0.9 % IV SOLN
432.5000 mg | Freq: Once | INTRAVENOUS | Status: AC
  Administered 2022-04-27: 430 mg via INTRAVENOUS
  Filled 2022-04-27: qty 43

## 2022-04-27 MED ORDER — SODIUM CHLORIDE 0.9 % IV SOLN
10.0000 mg | Freq: Once | INTRAVENOUS | Status: AC
  Administered 2022-04-27: 10 mg via INTRAVENOUS
  Filled 2022-04-27: qty 10

## 2022-04-27 MED ORDER — SODIUM CHLORIDE 0.9 % IV SOLN
200.0000 mg | Freq: Once | INTRAVENOUS | Status: AC
  Administered 2022-04-27: 200 mg via INTRAVENOUS
  Filled 2022-04-27: qty 8

## 2022-04-27 MED ORDER — SODIUM CHLORIDE 0.9 % IV SOLN
500.0000 mg/m2 | Freq: Once | INTRAVENOUS | Status: AC
  Administered 2022-04-27: 700 mg via INTRAVENOUS
  Filled 2022-04-27: qty 20

## 2022-04-27 MED ORDER — SODIUM CHLORIDE 0.9 % IV SOLN
150.0000 mg | Freq: Once | INTRAVENOUS | Status: AC
  Administered 2022-04-27: 150 mg via INTRAVENOUS
  Filled 2022-04-27: qty 150

## 2022-04-27 NOTE — Progress Notes (Signed)
[place] [phone]   [phone]  OFFICE PROGRESS NOTE  [name], NP [place] [address]  DIAGNOSIS: Recurrent/metastatic non-small cell lung cancer initially diagnosed as stage IIIB  (T1b, N3, M0) non-small cell lung cancer, adenocarcinoma she presented with right upper lobe nodule in addition to bulky right hilar, mediastinal, and left supraclavicular lymphadenopathy diagnosed in November 2022.  The patient had evidence for disease recurrence in the mediastinal and right supraclavicular lymphadenopathy in August 2023.  The patient had evidence of metastatic disease in February 2024 with several supraclavicular, thoracic and precarinal lymphadenopathy as well as upper abdominal lymph nodes and small liver lesion.  DETECTED ALTERATION(S) / BIOMARKER(S) % CFDNA OR AMPLIFICATION ASSOCIATED FDA-APPROVED THERAPIES CLINICAL TRIAL AVAILABILITY [id number] ND 0.5 5 50 100 4.7%  [id number] ND 0.5 5 50 100 1.8%  [id number] ND 0.5 5 50 100 1.9%  BIOMARKER ADDITIONAL DETAILS Tumor Mutational Burden (TMB) 19.02 mut/Mb MSI Status Stable (MSS) PD-L1 Tumor Proportion Score (TPS)* <1%   PRIOR THERAPY:  1) Concurrent chemoradiation with carboplatin for an AUC of 2 and paclitaxel 45 mg per metered squared.  First dose on 01/05/2021.  Status post 7 cycles of treatment.  Last dose was given February 16, 2021. 2) Consolidation immunotherapy with Imfinzi 1500 Mg IV every 4 weeks.  First dose March 25, 2021.  Status post 3 cycles.  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 3) Palliative radiotherapy to the enlarging right supraclavicular lymphadenopathy under the care of Dr. Sondra Come expected to be completed on October 28, 2021.   CURRENT THERAPY: Palliative systemic chemotherapy with carboplatin for AUC of 5, Alimta 500 Mg/M2 and Keytruda 200 Mg IV every 3 weeks.  First dose April 05, 2022.   Status post 1 cycle.  INTERVAL HISTORY: Julie Nicholson 57 y.o. female returns to the clinic today for follow-up visit accompanied by her husband.  The patient is feeling fine today with no concerning complaints.  She tolerated the first cycle of her treatment fairly well with no concerning adverse effect except for mild nausea.  She denied having any chest pain, shortness of breath, cough or hemoptysis.  She has no nausea, vomiting, diarrhea or constipation.  She has no recent weight loss or night sweats.  She has no headache or visual changes.  She is here today for evaluation before starting cycle #2.   MEDICAL HISTORY: Past Medical History:  Diagnosis Date   Abdominal discomfort    Cancer ([place])    Chronic headaches    due to allergies, sinus   COPD (chronic obstructive pulmonary disease) ([place])    per 2012 chest xray   pt states she doesn not have this now (04/10/2013)   Deviated nasal septum    Eustachian tube dysfunction    GERD (gastroesophageal reflux disease)    occasional uses Tums / Rolaids   Hearing loss    right ear   High cholesterol    History of radiation therapy    right lung 01/07/2021-02/19/2021  Dr Gery Pray   Migraine    "only once in a blue moon since RX'd allergy shots" (04/10/2013)   Pancreatitis 02/08/2013   Pneumonia    Rhinitis, allergic     ALLERGIES:  is allergic to benadryl [diphenhydramine].  MEDICATIONS:  Current Outpatient Medications  Medication Sig Dispense Refill   acetaminophen (TYLENOL) 500 MG tablet Take 1,000 mg by mouth every 6 (six) hours as needed for moderate pain.  albuterol (PROVENTIL) (2.5 MG/3ML) 0.083% nebulizer solution albuterol sulfate 2.5 mg/3 mL (0.083 %) solution for nebulization  USE 1 VIAL IN NEBULIZER EVERY 6 HOURS AS NEEDED FOR WHEEZING OR SHORTNESS OF BREATH     aspirin EC 81 MG tablet Take 81 mg by mouth at bedtime.     atorvastatin (LIPITOR) 40 MG tablet Take 40 mg by mouth at bedtime.     benzonatate (TESSALON)  200 MG capsule Take 200 mg by mouth 3 (three) times daily as needed. (Patient not taking: Reported on 04/13/2022)     Biotin 5000 MCG TABS Take 5,000 mcg by mouth at bedtime.     buPROPion (WELLBUTRIN XL) 150 MG 24 hr tablet Take 150 mg by mouth every morning.     Ca Carbonate-Mag Hydroxide (ROLAIDS PO) Take 1 tablet by mouth daily as needed (heartburn).     famotidine (PEPCID) 40 MG tablet Take 40 mg by mouth daily.     fluticasone (FLONASE) 50 MCG/ACT nasal spray Place 2 sprays into both nostrils daily as needed for allergies.     folic acid (FOLVITE) 1 MG tablet Take 1 tablet (1 mg total) by mouth daily. 30 tablet 2   hydrocortisone 1 % ointment Apply 1 Application topically 2 (two) times daily. 30 g 0   linaCLOtide (LINZESS PO) Take by mouth.     loratadine (CLARITIN) 10 MG tablet TAKE 1 TABLET BY MOUTH EVERY DAY 90 tablet 1   Multiple Vitamins-Minerals (HAIR SKIN AND NAILS FORMULA PO) Take 1 tablet by mouth daily.     ondansetron (ZOFRAN-ODT) 8 MG disintegrating tablet Take 8 mg by mouth 3 (three) times daily as needed. (Patient not taking: Reported on 04/13/2022)     Oxycodone HCl 10 MG TABS Take 1 tablet by mouth 3 (three) times daily as needed (pain). (Patient not taking: Reported on 04/13/2022)     prochlorperazine (COMPAZINE) 10 MG tablet Take 1 tablet (10 mg total) by mouth every 6 (six) hours as needed. (Patient not taking: Reported on 04/13/2022) 30 tablet 2   rizatriptan (MAXALT-MLT) 10 MG disintegrating tablet Take 10 mg by mouth as needed for migraine. May repeat in 2 hours if needed     sertraline (ZOLOFT) 50 MG tablet Take 1 tablet by mouth daily.     Tiotropium Bromide-Olodaterol (STIOLTO RESPIMAT) 2.5-2.5 MCG/ACT AERS Inhale 2 puffs into the lungs daily. 4 g 3   No current facility-administered medications for this visit.    SURGICAL HISTORY:  Past Surgical History:  Procedure Laterality Date   ABDOMINAL HYSTERECTOMY  1995   tx endometriosis, both ovaries removed    APPENDECTOMY  late 1990's   BIOPSY  02/26/2021   Procedure: BIOPSY;  Surgeon: Milus Banister, MD;  Location: [facility];  Service: Endoscopy;;   BRONCHIAL BRUSHINGS  12/15/2020   Procedure: BRONCHIAL BRUSHINGS;  Surgeon: Collene Gobble, MD;  Location: [facility];  Service: Cardiopulmonary;;   BRONCHIAL NEEDLE ASPIRATION BIOPSY  12/15/2020   Procedure: BRONCHIAL NEEDLE ASPIRATION BIOPSIES;  Surgeon: Collene Gobble, MD;  Location: [facility];  Service: Cardiopulmonary;;   CHOLECYSTECTOMY  04/10/2013   CHOLECYSTECTOMY N/A 04/10/2013   Procedure: LAPAROSCOPIC CHOLECYSTECTOMY WITH INTRAOPERATIVE CHOLANGIOGRAM;  Surgeon: Odis Hollingshead, MD;  Location: [facility];  Service: General;  Laterality: N/A;   ELECTROMAGNETIC NAVIGATION BROCHOSCOPY  12/15/2020   Procedure: ELECTROMAGNETIC NAVIGATION BRONCHOSCOPY;  Surgeon: Collene Gobble, MD;  Location: [facility];  Service: Cardiopulmonary;;   ESOPHAGOGASTRODUODENOSCOPY (EGD) WITH PROPOFOL N/A 02/26/2021   Procedure: ESOPHAGOGASTRODUODENOSCOPY (EGD) WITH PROPOFOL;  Surgeon: Milus Banister, MD;  Location: [facility];  Service: Endoscopy;  Laterality: N/A;   EUS N/A 02/16/2013   Procedure: UPPER ENDOSCOPIC ULTRASOUND (EUS) LINEAR;  Surgeon: Milus Banister, MD;  Location: [facility];  Service: Endoscopy;  Laterality: N/A;  radial linear   KNEE ARTHROSCOPY Right 1980's   "cartilage OR"   LAPAROSCOPIC ENDOMETRIOSIS FULGURATION  1980's   MYRINGOTOMY WITH TUBE PLACEMENT Right 07/13/2018   Procedure: MYRINGOTOMY WITH TUBE PLACEMENT;  Surgeon: Margaretha Sheffield, MD;  Location: [facility];  Service: ENT;  Laterality: Right;   MYRINGOTOMY WITH TUBE PLACEMENT Right 06/04/2021   Procedure: MYRINGOTOMY WITH BUTTERFLY TUBE PLACEMENT;  Surgeon: Margaretha Sheffield, MD;  Location: [facility];  Service: ENT;  Laterality: Right;   NASOPHARYNGOSCOPY EUSTATION TUBE BALLOON DILATION Right 07/13/2018   Procedure: NASOPHARYNGOSCOPY EUSTATION TUBE BALLOON  DILATION;  Surgeon: Margaretha Sheffield, MD;  Location: [facility];  Service: ENT;  Laterality: Right;   TONSILLECTOMY AND ADENOIDECTOMY  ~ 1980   adenoidectomy   TUBAL LIGATION  ~ [place] Right 07/13/2018   Procedure: OUTFRACTURE TURBINATE;  Surgeon: Margaretha Sheffield, MD;  Location: [facility];  Service: ENT;  Laterality: Right;   VIDEO BRONCHOSCOPY WITH ENDOBRONCHIAL ULTRASOUND N/A 12/15/2020   Procedure: ROBOTIC VIDEO BRONCHOSCOPY WITH ENDOBRONCHIAL ULTRASOUND;  Surgeon: Collene Gobble, MD;  Location: [facility];  Service: Cardiopulmonary;  Laterality: N/A;   WRIST SURGERY Left    w/plate    REVIEW OF SYSTEMS:  A comprehensive review of systems was negative except for: Constitutional: positive for fatigue   PHYSICAL EXAMINATION: General appearance: alert, cooperative, fatigued, and no distress Head: Normocephalic, without obvious abnormality, atraumatic Neck: moderate anterior cervical adenopathy, no JVD, supple, symmetrical, trachea midline, and thyroid not enlarged, symmetric, no tenderness/mass/nodules Lymph nodes: Cervical, supraclavicular, and axillary nodes normal. Resp: clear to auscultation bilaterally Back: symmetric, no curvature. ROM normal. No CVA tenderness. Cardio: regular rate and rhythm, S1, S2 normal, no murmur, click, rub or gallop GI: soft, non-tender; bowel sounds normal; no masses,  no organomegaly Extremities: extremities normal, atraumatic, no cyanosis or edema  ECOG PERFORMANCE STATUS: 1 - Symptomatic but completely ambulatory  Blood pressure 113/72, pulse (!) 107, temperature 98 F (36.7 C), temperature source Oral, resp. rate 16, weight 111 lb 12.8 oz (50.7 kg), SpO2 100 %.  LABORATORY DATA: Lab Results  Component Value Date   WBC 3.7 (L) 04/27/2022   HGB 12.0 04/27/2022   HCT 34.8 (L) 04/27/2022   MCV 96.7 04/27/2022   PLT 147 (L) 04/27/2022      Chemistry      Component Value Date/Time   NA 140 04/27/2022 0839    K 3.5 04/27/2022 0839   CL 106 04/27/2022 0839   CO2 26 04/27/2022 0839   BUN 18 04/27/2022 0839   CREATININE 0.89 04/27/2022 0839      Component Value Date/Time   CALCIUM 9.8 04/27/2022 0839   ALKPHOS 119 04/27/2022 0839   AST 20 04/27/2022 0839   ALT 29 04/27/2022 0839   BILITOT 0.5 04/27/2022 0839       RADIOGRAPHIC STUDIES: [place] CORE BIOPSY (LYMPH NODES)  Result Date: 04/12/2022 INDICATION: History of lung cancer with diffuse lymphadenopathy on recent imaging and request for tissue sampling. EXAM: ULTRASOUND-GUIDED BIOPSY OF LEFT SUPRACLAVICULAR LYMPH NODE MEDICATIONS: Moderate sedation ANESTHESIA/SEDATION: Moderate (conscious) sedation was employed during this procedure. A total of Versed 3 mg and Fentanyl 100 mcg was administered intravenously by the radiology nurse. Total intra-service moderate Sedation Time: 10  minutes. The patient's level of consciousness and vital signs were monitored continuously by radiology nursing throughout the procedure under my direct supervision. FLUOROSCOPY TIME:  None COMPLICATIONS: None immediate. PROCEDURE: Informed written consent was obtained from the patient after a thorough discussion of the procedural risks, benefits and alternatives. All questions were addressed. A timeout was performed prior to the initiation of the procedure. Ultrasound demonstrated multiple enlarged left supraclavicular lymph nodes. Lymph node just anterior and lateral to the left internal jugular vein was targeted for biopsy. Left side of the neck was prepped with chlorhexidine and sterile field was created. Skin was anesthetized with 1% lidocaine. Small incision was made. Using ultrasound guidance, 18 gauge core biopsy needle was directed into the targeted lymph node. Total of 5 core biopsies were obtained. Specimens placed on a Telfa pad with saline. Biopsy needle confirmed within the lymph node on all occasions. Bandage placed over the puncture site. FINDINGS: Several enlarged  hypoechoic lymph nodes in left supraclavicular region. Lymph node just anterior and lateral to left internal jugular vein was sampled. No immediate bleeding or hematoma formation. IMPRESSION: Ultrasound-guided core biopsy of a left supraclavicular lymph node. Electronically Signed   By: Markus Daft M.D.   On: 04/12/2022 21:19   CT Abdomen Pelvis W Contrast  Result Date: 04/10/2022 CLINICAL DATA:  Acute abdominal pain. History of non-small cell lung cancer. EXAM: CT ABDOMEN AND PELVIS WITH CONTRAST TECHNIQUE: Multidetector CT imaging of the abdomen and pelvis was performed using the standard protocol following bolus administration of intravenous contrast. RADIATION DOSE REDUCTION: This exam was performed according to the departmental dose-optimization program which includes automated exposure control, adjustment of the mA and/or kV according to patient size and/or use of iterative reconstruction technique. CONTRAST:  173mL OMNIPAQUE IOHEXOL 300 MG/ML  SOLN COMPARISON:  CT abdomen and pelvis 02/22/2021.  PET-CT 03/26/2022. FINDINGS: Lower chest: No acute abnormality. Hepatobiliary: Patient is status post cholecystectomy. There is no biliary ductal dilatation. There is a rounded 14 mm lesion in the right lobe of the liver which measured 1 cm on prior study. No new liver lesions are identified. Pancreas: Mild prominence of the pancreatic duct is unchanged. No focal pancreatic lesions or acute inflammation. Spleen: Normal in size without focal abnormality. Adrenals/Urinary Tract: Mildly hyperdense rounded area in the right renal cortex measures less than 1 cm appears unchanged. Subcentimeter cortical cyst in the left kidney is unchanged. There is no hydronephrosis or perinephric fluid. Adrenal glands and bladder within normal limits. Stomach/Bowel: Stomach is within normal limits. No evidence of bowel wall thickening, distention, or inflammatory changes. Sigmoid colon diverticula are present. The appendix is likely  surgically absent. Vascular/Lymphatic: Aorta and IVC are normal in size. There are atherosclerotic calcifications of the aorta. Enlarged portacaval lymph node measures 12 mm short axis. There are multiple enlarged periportal lymph nodes measuring up to 16 mm short axis. These have not significantly changed. Reproductive: Status post hysterectomy. No adnexal masses. Other: No abdominal wall hernia or abnormality. No abdominopelvic ascites. Musculoskeletal: No acute or significant osseous findings. IMPRESSION: 1. No acute localizing process in the abdomen or pelvis. 2. 14 mm metastatic lesion in the right lobe of the liver has mildly increased in size; this may be related to differences in technique. 3. Unchanged periportal and portacaval lymphadenopathy. 4. Stable mildly hyperdense lesion in the right kidney, indeterminate. This can be further evaluated with MRI. 5. Colonic diverticulosis without evidence for diverticulitis. Aortic Atherosclerosis (ICD10-I70.0). Electronically Signed   By: Tina Griffiths.D.  On: 04/10/2022 18:30   MR Brain W Wo Contrast  Result Date: 04/06/2022 CLINICAL DATA:  Metastatic disease evaluation. EXAM: MRI HEAD WITHOUT AND WITH CONTRAST TECHNIQUE: Multiplanar, multiecho pulse sequences of the brain and surrounding structures were obtained without and with intravenous contrast. CONTRAST:  69mL GADAVIST GADOBUTROL 1 MMOL/ML IV SOLN COMPARISON:  Brain MRI 09/30/2021 FINDINGS: Brain: There is no acute intracranial hemorrhage, extra-axial fluid collection, or acute infarct Parenchymal volume is normal the ventricles are normal in size. A few small foci of FLAIR signal abnormality in the supratentorial white matter are nonspecific but may reflect sequela of minimal chronic small-vessel ischemic change The pituitary and suprasellar region are normal. A right cerebellar hemisphere developmental venous anomaly is noted. There is no mass lesion or abnormal enhancement. There is no mass effect or  midline shift. Vascular: Normal flow voids. Skull and upper cervical spine: Normal marrow signal. Sinuses/Orbits: The paranasal sinuses are clear. The globes and orbits are unremarkable. Other: None. IMPRESSION: No evidence of intracranial metastatic disease. Electronically Signed   By: Valetta Mole M.D.   On: 04/06/2022 13:25    ASSESSMENT AND PLAN: This is a very pleasant 57 years old white female with stage IIIB (T1b, N3, M0) non-small cell lung cancer, adenocarcinoma diagnosed in November 2022 with no actionable mutation and negative PD-L1 expression. The patient completed a course of concurrent chemoradiation with weekly carboplatin for AUC of 2 and paclitaxel 45 Mg/M2 status post 7 cycles.  She has been tolerating her treatment well except for the mild odynophagia and skin burns. She was also recently admitted to the hospital complaining of dysphagia and odynophagia secondary to radiation induced esophagitis.  She is feeling much better but continues to have residual dysphagia.  She is followed by gastroenterology and was seen by Dr. Ardis Hughs during her hospitalization. Her scan showed improvement of her disease. I recommended for the patient treatment with consolidation immunotherapy with Imfinzi 1500 Mg IV every 4 weeks.  Status post 3 cycles.  Last dose was given in April 2023.  Her treatment was discontinued secondary to suspicious immunotherapy mediated pneumonitis with significant shortness of breath at that time and she was treated with a tapered dose of prednisone. Unfortunately her scan showed evidence for disease recurrence with enlargement of lower right cervical lymph nodes as well as mediastinal lymphadenopathy.  She has palpable right cervical lymphadenopathy. 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 The patient underwent ultrasound-guided core biopsy of the right supraclavicular lymph nodes but the final pathology showed necrotic tumor cells with complete coagulative necrosis with focal fibrous tissue and histiocytic reaction.  She also had MRI of the brain that showed no evidence of metastatic disease to the brain. The patient was seen by Dr. Sondra Come and started palliative radiotherapy to the enlarging right supraclavicular lymphadenopathy.  This was completed on October 28, 2021. The patient was found to have metastatic disease in February 2024 with several lymphadenopathy in the chest as well as supraclavicular, upper abdomen as well as suspicious small liver metastasis. 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  I will send the tissue biopsy  to [facility] for molecular studies. She is currently undergoing systemic chemotherapy with carboplatin for AUC of 5, Alimta 500 Mg/M2 and Keytruda 200 Mg IV every 3 weeks status post 1 cycle.  She tolerated the first cycle of her treatment fairly well with no concerning adverse effect except for occasional nausea. I recommended for her to proceed with cycle #2 today as planned. I will see her back for follow-up visit in 3 weeks for evaluation before the next cycle of her treatment. She was advised to call immediately if she has any concerning symptoms in the interval.  The patient voices understanding of current disease status and treatment options and is in agreement with the current care plan.  All questions were answered. The patient knows to call the clinic with any problems, questions or concerns. We can certainly see the  patient much sooner if necessary.  The total time spent in the appointment was 20 minutes.  Disclaimer: This note was dictated with voice recognition software. Similar sounding words can inadvertently be transcribed and may not be corrected upon review.

## 2022-04-27 NOTE — Patient Instructions (Addendum)
[place]  Discharge Instructions: Thank you for choosing [facility] to provide your oncology and hematology care.   If you have a lab appointment with the [facility], please go directly to the [facility] and check in at the registration area.   Wear comfortable clothing and clothing appropriate for easy access to any Portacath or PICC line.   We strive to give you quality time with your provider. You may need to reschedule your appointment if you arrive late (15 or more minutes).  Arriving late affects you and other patients whose appointments are after yours.  Also, if you miss three or more appointments without notifying the office, you may be dismissed from the clinic at the provider's discretion.      For prescription refill requests, have your pharmacy contact our office and allow 72 hours for refills to be completed.    Today you received the following chemotherapy and/or immunotherapy agents: Pembrolizumab (Keytruda), Pemetrexed (Alimta), and Carboplatin.   To help prevent nausea and vomiting after your treatment, we encourage you to take your nausea medication as directed.  BELOW ARE SYMPTOMS THAT SHOULD BE REPORTED IMMEDIATELY: *FEVER GREATER THAN 100.4 F (38 C) OR HIGHER *CHILLS OR SWEATING *NAUSEA AND VOMITING THAT IS NOT CONTROLLED WITH YOUR NAUSEA MEDICATION *UNUSUAL SHORTNESS OF BREATH *UNUSUAL BRUISING OR BLEEDING *URINARY PROBLEMS (pain or burning when urinating, or frequent urination) *BOWEL PROBLEMS (unusual diarrhea, constipation, pain near the anus) TENDERNESS IN MOUTH AND THROAT WITH OR WITHOUT PRESENCE OF ULCERS (sore throat, sores in mouth, or a toothache) UNUSUAL RASH, SWELLING OR PAIN  UNUSUAL VAGINAL DISCHARGE OR ITCHING   Items with * indicate a potential emergency and should be followed up as soon as possible or go to the Emergency Department if any problems should occur.  Please show the  CHEMOTHERAPY ALERT CARD or IMMUNOTHERAPY ALERT CARD at check-in to the Emergency Department and triage nurse.  Should you have questions after your visit or need to cancel or reschedule your appointment, please contact [facility]  Dept: [phone]  and follow the prompts.  Office hours are 8:00 a.m. to 4:30 p.m. Monday - Friday. Please note that voicemails left after 4:00 p.m. may not be returned until the following business day.  We are closed weekends and major holidays. You have access to a nurse at all times for urgent questions. Please call the main number to the clinic Dept: [phone] and follow the prompts.   For any non-urgent questions, you may also contact your provider using MyChart. We now offer e-Visits for anyone 1 and older to request care online for non-urgent symptoms. For details visit [email or web address].   Also download the MyChart app! Go to the app store, search "MyChart", open the app, select [facility], and log in with your MyChart username and password.  Pembrolizumab Injection What is this medication? PEMBROLIZUMAB (PEM broe LIZ ue mab) treats some types of cancer. It works by helping your immune system slow or stop the spread of cancer cells. It is a monoclonal antibody. This medicine may be used for other purposes; ask your health care provider or pharmacist if you have questions. COMMON BRAND NAME(S): Keytruda What should I tell my care team before I take this medication? They need to know if you have any of these conditions: Allogeneic stem cell transplant (uses someone else's stem cells) Autoimmune diseases, such as Crohn disease, ulcerative colitis, lupus History of chest  radiation Nervous system problems, such as Guillain-Barre syndrome, myasthenia gravis Organ transplant An unusual or allergic reaction to pembrolizumab, other medications, foods, dyes, or preservatives Pregnant or trying to get  pregnant Breast-feeding How should I use this medication? This medication is injected into a vein. It is given by your care team in a hospital or clinic setting. A special MedGuide will be given to you before each treatment. Be sure to read this information carefully each time. Talk to your care team about the use of this medication in children. While it may be prescribed for children as young as 6 months for selected conditions, precautions do apply. Overdosage: If you think you have taken too much of this medicine contact a poison control center or emergency room at once. NOTE: This medicine is only for you. Do not share this medicine with others. What if I miss a dose? Keep appointments for follow-up doses. It is important not to miss your dose. Call your care team if you are unable to keep an appointment. What may interact with this medication? Interactions have not been studied. This list may not describe all possible interactions. Give your health care provider a list of all the medicines, herbs, non-prescription drugs, or dietary supplements you use. Also tell them if you smoke, drink alcohol, or use illegal drugs. Some items may interact with your medicine. What should I watch for while using this medication? Your condition will be monitored carefully while you are receiving this medication. You may need blood work while taking this medication. This medication may cause serious skin reactions. They can happen weeks to months after starting the medication. Contact your care team right away if you notice fevers or flu-like symptoms with a rash. The rash may be red or purple and then turn into blisters or peeling of the skin. You may also notice a red rash with swelling of the face, lips, or lymph nodes in your neck or under your arms. Tell your care team right away if you have any change in your eyesight. Talk to your care team if you may be pregnant. Serious birth defects can occur if you  take this medication during pregnancy and for 4 months after the last dose. You will need a negative pregnancy test before starting this medication. Contraception is recommended while taking this medication and for 4 months after the last dose. Your care team can help you find the option that works for you. Do not breastfeed while taking this medication and for 4 months after the last dose. What side effects may I notice from receiving this medication? Side effects that you should report to your care team as soon as possible: Allergic reactions--skin rash, itching, hives, swelling of the face, lips, tongue, or throat Dry cough, shortness of breath or trouble breathing Eye pain, redness, irritation, or discharge with blurry or decreased vision Heart muscle inflammation--unusual weakness or fatigue, shortness of breath, chest pain, fast or irregular heartbeat, dizziness, swelling of the ankles, feet, or hands Hormone gland problems--headache, sensitivity to light, unusual weakness or fatigue, dizziness, fast or irregular heartbeat, increased sensitivity to cold or heat, excessive sweating, constipation, hair loss, increased thirst or amount of urine, tremors or shaking, irritability Infusion reactions--chest pain, shortness of breath or trouble breathing, feeling faint or lightheaded Kidney injury (glomerulonephritis)--decrease in the amount of urine, red or dark brown urine, foamy or bubbly urine, swelling of the ankles, hands, or feet Liver injury--right upper belly pain, loss of appetite, nausea, light-colored stool, dark  yellow or brown urine, yellowing skin or eyes, unusual weakness or fatigue Pain, tingling, or numbness in the hands or feet, muscle weakness, change in vision, confusion or trouble speaking, loss of balance or coordination, trouble walking, seizures Rash, fever, and swollen lymph nodes Redness, blistering, peeling, or loosening of the skin, including inside the mouth Sudden or  severe stomach pain, bloody diarrhea, fever, nausea, vomiting Side effects that usually do not require medical attention (report to your care team if they continue or are bothersome): Bone, joint, or muscle pain Diarrhea Fatigue Loss of appetite Nausea Skin rash This list may not describe all possible side effects. Call your doctor for medical advice about side effects. You may report side effects to FDA at 1-800-FDA-1088. Where should I keep my medication? This medication is given in a hospital or clinic. It will not be stored at home. NOTE: This sheet is a summary. It may not cover all possible information. If you have questions about this medicine, talk to your doctor, pharmacist, or health care provider.  2023 Elsevier/Gold Standard (2021-06-02 00:00:00) Pemetrexed Injection What is this medication? PEMETREXED (PEM e TREX ed) treats some types of cancer. It works by slowing down the growth of cancer cells. This medicine may be used for other purposes; ask your health care provider or pharmacist if you have questions. COMMON BRAND NAME(S): Alimta, PEMFEXY What should I tell my care team before I take this medication? They need to know if you have any of these conditions: Infection, such as chickenpox, cold sores, or herpes Kidney disease Low blood cell levels (white cells, red cells, and platelets) Lung or breathing disease, such as asthma Radiation therapy An unusual or allergic reaction to pemetrexed, other medications, foods, dyes, or preservatives If you or your partner are pregnant or trying to get pregnant Breast-feeding How should I use this medication? This medication is injected into a vein. It is given by your care team in a hospital or clinic setting. Talk to your care team about the use of this medication in children. Special care may be needed. Overdosage: If you think you have taken too much of this medicine contact a poison control center or emergency room at  once. NOTE: This medicine is only for you. Do not share this medicine with others. What if I miss a dose? Keep appointments for follow-up doses. It is important not to miss your dose. Call your care team if you are unable to keep an appointment. What may interact with this medication? Do not take this medication with any of the following: Live virus vaccines This medication may also interact with the following: Ibuprofen This list may not describe all possible interactions. Give your health care provider a list of all the medicines, herbs, non-prescription drugs, or dietary supplements you use. Also tell them if you smoke, drink alcohol, or use illegal drugs. Some items may interact with your medicine. What should I watch for while using this medication? Your condition will be monitored carefully while you are receiving this medication. This medication may make you feel generally unwell. This is not uncommon as chemotherapy can affect healthy cells as well as cancer cells. Report any side effects. Continue your course of treatment even though you feel ill unless your care team tells you to stop. This medication can cause serious side effects. To reduce the risk, your care team may give you other medications to take before receiving this one. Be sure to follow the directions from your care team. This  medication can cause a rash or redness in areas of the body that have previously had radiation therapy. If you have had radiation therapy, tell your care team if you notice a rash in this area. This medication may increase your risk of getting an infection. Call your care team for advice if you get a fever, chills, sore throat, or other symptoms of a cold or flu. Do not treat yourself. Try to avoid being around people who are sick. Be careful brushing or flossing your teeth or using a toothpick because you may get an infection or bleed more easily. If you have any dental work done, tell your dentist you  are receiving this medication. Avoid taking medications that contain aspirin, acetaminophen, ibuprofen, naproxen, or ketoprofen unless instructed by your care team. These medications may hide a fever. Check with your care team if you have severe diarrhea, nausea, and vomiting, or if you sweat a lot. The loss of too much body fluid may make it dangerous for you to take this medication. Talk to your care team if you or your partner wish to become pregnant or think either of you might be pregnant. This medication can cause serious birth defects if taken during pregnancy and for 6 months after the last dose. A negative pregnancy test is required before starting this medication. A reliable form of contraception is recommended while taking this medication and for 6 months after the last dose. Talk to your care team about reliable forms of contraception. Do not father a child while taking this medication and for 3 months after the last dose. Use a condom while having sex during this time period. Do not breastfeed while taking this medication and for 1 week after the last dose. This medication may cause infertility. Talk to your care team if you are concerned about your fertility. What side effects may I notice from receiving this medication? Side effects that you should report to your care team as soon as possible: Allergic reactions--skin rash, itching, hives, swelling of the face, lips, tongue, or throat Dry cough, shortness of breath or trouble breathing Infection--fever, chills, cough, sore throat, wounds that don't heal, pain or trouble when passing urine, general feeling of discomfort or being unwell Kidney injury--decrease in the amount of urine, swelling of the ankles, hands, or feet Low red blood cell level--unusual weakness or fatigue, dizziness, headache, trouble breathing Redness, blistering, peeling, or loosening of the skin, including inside the mouth Unusual bruising or bleeding Side effects  that usually do not require medical attention (report to your care team if they continue or are bothersome): Fatigue Loss of appetite Nausea Vomiting This list may not describe all possible side effects. Call your doctor for medical advice about side effects. You may report side effects to FDA at 1-800-FDA-1088. Where should I keep my medication? This medication is given in a hospital or clinic. It will not be stored at home. NOTE: This sheet is a summary. It may not cover all possible information. If you have questions about this medicine, talk to your doctor, pharmacist, or health care provider.  2023 Elsevier/Gold Standard (2021-05-25 00:00:00) Carboplatin Injection What is this medication? CARBOPLATIN (KAR boe pla tin) treats some types of cancer. It works by slowing down the growth of cancer cells. This medicine may be used for other purposes; ask your health care provider or pharmacist if you have questions. COMMON BRAND NAME(S): Paraplatin What should I tell my care team before I take this medication? They need to  know if you have any of these conditions: Blood disorders Hearing problems Kidney disease Recent or ongoing radiation therapy An unusual or allergic reaction to carboplatin, cisplatin, other medications, foods, dyes, or preservatives Pregnant or trying to get pregnant Breast-feeding How should I use this medication? This medication is injected into a vein. It is given by your care team in a hospital or clinic setting. Talk to your care team about the use of this medication in children. Special care may be needed. Overdosage: If you think you have taken too much of this medicine contact a poison control center or emergency room at once. NOTE: This medicine is only for you. Do not share this medicine with others. What if I miss a dose? Keep appointments for follow-up doses. It is important not to miss your dose. Call your care team if you are unable to keep an  appointment. What may interact with this medication? Medications for seizures Some antibiotics, such as amikacin, gentamicin, neomycin, streptomycin, tobramycin Vaccines This list may not describe all possible interactions. Give your health care provider a list of all the medicines, herbs, non-prescription drugs, or dietary supplements you use. Also tell them if you smoke, drink alcohol, or use illegal drugs. Some items may interact with your medicine. What should I watch for while using this medication? Your condition will be monitored carefully while you are receiving this medication. You may need blood work while taking this medication. This medication may make you feel generally unwell. This is not uncommon, as chemotherapy can affect healthy cells as well as cancer cells. Report any side effects. Continue your course of treatment even though you feel ill unless your care team tells you to stop. In some cases, you may be given additional medications to help with side effects. Follow all directions for their use. This medication may increase your risk of getting an infection. Call your care team for advice if you get a fever, chills, sore throat, or other symptoms of a cold or flu. Do not treat yourself. Try to avoid being around people who are sick. Avoid taking medications that contain aspirin, acetaminophen, ibuprofen, naproxen, or ketoprofen unless instructed by your care team. These medications may hide a fever. Be careful brushing or flossing your teeth or using a toothpick because you may get an infection or bleed more easily. If you have any dental work done, tell your dentist you are receiving this medication. Talk to your care team if you wish to become pregnant or think you might be pregnant. This medication can cause serious birth defects. Talk to your care team about effective forms of contraception. Do not breast-feed while taking this medication. What side effects may I notice from  receiving this medication? Side effects that you should report to your care team as soon as possible: Allergic reactions--skin rash, itching, hives, swelling of the face, lips, tongue, or throat Infection--fever, chills, cough, sore throat, wounds that don't heal, pain or trouble when passing urine, general feeling of discomfort or being unwell Low red blood cell level--unusual weakness or fatigue, dizziness, headache, trouble breathing Pain, tingling, or numbness in the hands or feet, muscle weakness, change in vision, confusion or trouble speaking, loss of balance or coordination, trouble walking, seizures Unusual bruising or bleeding Side effects that usually do not require medical attention (report to your care team if they continue or are bothersome): Hair loss Nausea Unusual weakness or fatigue Vomiting This list may not describe all possible side effects. Call your doctor for  medical advice about side effects. You may report side effects to FDA at 1-800-FDA-1088. Where should I keep my medication? This medication is given in a hospital or clinic. It will not be stored at home. NOTE: This sheet is a summary. It may not cover all possible information. If you have questions about this medicine, talk to your doctor, pharmacist, or health care provider.  2023 Elsevier/Gold Standard (2007-03-11 00:00:00)

## 2022-04-27 NOTE — Progress Notes (Signed)
Per Dr Julien Nordmann, requested pathology send pt's tissue from biopsy on 3/11 for molecular studies and PD-L1.

## 2022-04-27 NOTE — Progress Notes (Signed)
Julie Nicholson seen by MD today  Vitals are not within treatment parameters. Per Dr. Julien Nordmann ,it is okay to treat pt today with cycle 2 Day 1 Carboplatin , Alimta and Keytruda and heart rate of 107.  Labs reviewed: and are within treatment parameters.  Per physician team, Julie Nicholson is ready for treatment and there are NO modifications to the treatment plan.

## 2022-05-03 ENCOUNTER — Other Ambulatory Visit: Payer: Self-pay

## 2022-05-03 ENCOUNTER — Inpatient Hospital Stay: Payer: Commercial Managed Care - PPO | Attending: Internal Medicine

## 2022-05-03 DIAGNOSIS — Z5189 Encounter for other specified aftercare: Secondary | ICD-10-CM | POA: Insufficient documentation

## 2022-05-03 DIAGNOSIS — Z5111 Encounter for antineoplastic chemotherapy: Secondary | ICD-10-CM | POA: Insufficient documentation

## 2022-05-03 DIAGNOSIS — Z5112 Encounter for antineoplastic immunotherapy: Secondary | ICD-10-CM | POA: Diagnosis present

## 2022-05-03 DIAGNOSIS — D696 Thrombocytopenia, unspecified: Secondary | ICD-10-CM | POA: Insufficient documentation

## 2022-05-03 DIAGNOSIS — D6481 Anemia due to antineoplastic chemotherapy: Secondary | ICD-10-CM | POA: Insufficient documentation

## 2022-05-03 DIAGNOSIS — C3411 Malignant neoplasm of upper lobe, right bronchus or lung: Secondary | ICD-10-CM | POA: Diagnosis present

## 2022-05-03 DIAGNOSIS — C770 Secondary and unspecified malignant neoplasm of lymph nodes of head, face and neck: Secondary | ICD-10-CM | POA: Diagnosis not present

## 2022-05-03 LAB — CBC WITH DIFFERENTIAL (CANCER CENTER ONLY)
Abs Immature Granulocytes: 0 10*3/uL (ref 0.00–0.07)
Basophils Absolute: 0 10*3/uL (ref 0.0–0.1)
Basophils Relative: 1 %
Eosinophils Absolute: 0 10*3/uL (ref 0.0–0.5)
Eosinophils Relative: 1 %
HCT: 27.3 % — ABNORMAL LOW (ref 36.0–46.0)
Hemoglobin: 9.6 g/dL — ABNORMAL LOW (ref 12.0–15.0)
Immature Granulocytes: 0 %
Lymphocytes Relative: 34 %
Lymphs Abs: 0.5 10*3/uL — ABNORMAL LOW (ref 0.7–4.0)
MCH: 33.4 pg (ref 26.0–34.0)
MCHC: 35.2 g/dL (ref 30.0–36.0)
MCV: 95.1 fL (ref 80.0–100.0)
Monocytes Absolute: 0.1 10*3/uL (ref 0.1–1.0)
Monocytes Relative: 5 %
Neutro Abs: 0.8 10*3/uL — ABNORMAL LOW (ref 1.7–7.7)
Neutrophils Relative %: 59 %
Platelet Count: 163 10*3/uL (ref 150–400)
RBC: 2.87 MIL/uL — ABNORMAL LOW (ref 3.87–5.11)
RDW: 13.6 % (ref 11.5–15.5)
Smear Review: NORMAL
WBC Count: 1.3 10*3/uL — ABNORMAL LOW (ref 4.0–10.5)
nRBC: 0 % (ref 0.0–0.2)

## 2022-05-03 LAB — CMP (CANCER CENTER ONLY)
ALT: 28 U/L (ref 0–44)
AST: 23 U/L (ref 15–41)
Albumin: 3.9 g/dL (ref 3.5–5.0)
Alkaline Phosphatase: 104 U/L (ref 38–126)
Anion gap: 6 (ref 5–15)
BUN: 23 mg/dL — ABNORMAL HIGH (ref 6–20)
CO2: 29 mmol/L (ref 22–32)
Calcium: 9.5 mg/dL (ref 8.9–10.3)
Chloride: 105 mmol/L (ref 98–111)
Creatinine: 0.76 mg/dL (ref 0.44–1.00)
GFR, Estimated: >60 mL/min (ref >60)
Glucose, Bld: 102 mg/dL — ABNORMAL HIGH (ref 70–99)
Potassium: 4.2 mmol/L (ref 3.5–5.1)
Sodium: 140 mmol/L (ref 135–145)
Total Bilirubin: 0.7 mg/dL (ref 0.3–1.2)
Total Protein: 6.9 g/dL (ref 6.5–8.1)

## 2022-05-04 ENCOUNTER — Encounter (HOSPITAL_COMMUNITY): Payer: Self-pay | Admitting: Internal Medicine

## 2022-05-04 ENCOUNTER — Other Ambulatory Visit: Payer: Self-pay

## 2022-05-06 ENCOUNTER — Telehealth: Payer: Self-pay | Admitting: Medical Oncology

## 2022-05-06 DIAGNOSIS — K1231 Oral mucositis (ulcerative) due to antineoplastic therapy: Secondary | ICD-10-CM

## 2022-05-06 MED ORDER — MAGIC MOUTHWASH
5.0000 mL | Freq: Four times a day (QID) | ORAL | 0 refills | Status: DC | PRN
Start: 2022-05-06 — End: 2022-05-06

## 2022-05-06 MED ORDER — MAGIC MOUTHWASH
5.0000 mL | Freq: Four times a day (QID) | ORAL | 0 refills | Status: DC | PRN
Start: 2022-05-06 — End: 2022-11-17

## 2022-05-06 NOTE — Telephone Encounter (Signed)
Blister on lip and tongue burning X 2 days . Denies fever or other symptoms.  MMW called to [facility] because they can compound nystatin which is on back order.  Marland Kitchen

## 2022-05-07 ENCOUNTER — Encounter (HOSPITAL_COMMUNITY): Payer: Self-pay | Admitting: Internal Medicine

## 2022-05-10 ENCOUNTER — Inpatient Hospital Stay: Payer: Commercial Managed Care - PPO

## 2022-05-10 ENCOUNTER — Telehealth: Payer: Self-pay | Admitting: Physician Assistant

## 2022-05-10 ENCOUNTER — Telehealth: Payer: Self-pay

## 2022-05-10 ENCOUNTER — Telehealth: Payer: Self-pay | Admitting: Medical Oncology

## 2022-05-10 ENCOUNTER — Other Ambulatory Visit: Payer: Self-pay | Admitting: Physician Assistant

## 2022-05-10 ENCOUNTER — Other Ambulatory Visit: Payer: Self-pay

## 2022-05-10 VITALS — BP 116/70 | HR 98 | Temp 98.4°F | Resp 18

## 2022-05-10 DIAGNOSIS — T451X5A Adverse effect of antineoplastic and immunosuppressive drugs, initial encounter: Secondary | ICD-10-CM

## 2022-05-10 DIAGNOSIS — Z5112 Encounter for antineoplastic immunotherapy: Secondary | ICD-10-CM | POA: Diagnosis not present

## 2022-05-10 DIAGNOSIS — C3411 Malignant neoplasm of upper lobe, right bronchus or lung: Secondary | ICD-10-CM

## 2022-05-10 LAB — CBC WITH DIFFERENTIAL (CANCER CENTER ONLY)
Abs Immature Granulocytes: 0 10*3/uL (ref 0.00–0.07)
Basophils Absolute: 0 10*3/uL (ref 0.0–0.1)
Basophils Relative: 0 %
Eosinophils Absolute: 0 10*3/uL (ref 0.0–0.5)
Eosinophils Relative: 2 %
HCT: 25.8 % — ABNORMAL LOW (ref 36.0–46.0)
Hemoglobin: 8.8 g/dL — ABNORMAL LOW (ref 12.0–15.0)
Immature Granulocytes: 0 %
Lymphocytes Relative: 47 %
Lymphs Abs: 0.5 10*3/uL — ABNORMAL LOW (ref 0.7–4.0)
MCH: 32.7 pg (ref 26.0–34.0)
MCHC: 34.1 g/dL (ref 30.0–36.0)
MCV: 95.9 fL (ref 80.0–100.0)
Monocytes Absolute: 0.2 10*3/uL (ref 0.1–1.0)
Monocytes Relative: 18 %
Neutro Abs: 0.4 10*3/uL — CL (ref 1.7–7.7)
Neutrophils Relative %: 33 %
Platelet Count: 42 10*3/uL — ABNORMAL LOW (ref 150–400)
RBC: 2.69 MIL/uL — ABNORMAL LOW (ref 3.87–5.11)
RDW: 13.3 % (ref 11.5–15.5)
WBC Count: 1 10*3/uL — ABNORMAL LOW (ref 4.0–10.5)
nRBC: 0 % (ref 0.0–0.2)

## 2022-05-10 LAB — CMP (CANCER CENTER ONLY)
ALT: 33 U/L (ref 0–44)
AST: 24 U/L (ref 15–41)
Albumin: 3.9 g/dL (ref 3.5–5.0)
Alkaline Phosphatase: 99 U/L (ref 38–126)
Anion gap: 7 (ref 5–15)
BUN: 12 mg/dL (ref 6–20)
CO2: 26 mmol/L (ref 22–32)
Calcium: 9.5 mg/dL (ref 8.9–10.3)
Chloride: 108 mmol/L (ref 98–111)
Creatinine: 0.97 mg/dL (ref 0.44–1.00)
GFR, Estimated: >60 mL/min (ref >60)
Glucose, Bld: 110 mg/dL — ABNORMAL HIGH (ref 70–99)
Potassium: 3.7 mmol/L (ref 3.5–5.1)
Sodium: 141 mmol/L (ref 135–145)
Total Bilirubin: 0.4 mg/dL (ref 0.3–1.2)
Total Protein: 6.8 g/dL (ref 6.5–8.1)

## 2022-05-10 MED ORDER — FILGRASTIM-SNDZ 300 MCG/0.5ML IJ SOSY
300.0000 ug | PREFILLED_SYRINGE | Freq: Once | INTRAMUSCULAR | Status: AC
  Administered 2022-05-10: 300 ug via SUBCUTANEOUS
  Filled 2022-05-10: qty 0.5

## 2022-05-10 NOTE — Telephone Encounter (Signed)
Contacted patient to scheduled appointments. Patient is aware of appointments that are scheduled.   

## 2022-05-10 NOTE — Telephone Encounter (Signed)
This nurse reached out to patient and made her aware that her WBC level is low from her labs today.  Advised that she will need to receive Zarxio injections for 3 consecutive days.  Advised that scheduling will reach out to her to set up her appointments.  Patient acknowledged understanding.  No further questions or concerns at this time.

## 2022-05-10 NOTE — Telephone Encounter (Signed)
CRITICAL VALUE STICKER  CRITICAL VALUE: ANC=0.4  RECEIVER (on-site recipient of call):Anthany Thornhill  DATE & TIME NOTIFIED: 05/10/22 @ 1016  MESSENGER (representative from lab):Mindi Junker  MD NOTIFIED: Arbutus Ped  TIME OF NOTIFICATION:1120  RESPONSE:  GCSF ordered.

## 2022-05-10 NOTE — Patient Instructions (Signed)
Filgrastim Injection What is this medication? FILGRASTIM (fil GRA stim) lowers the risk of infection in people who are receiving chemotherapy. It works by helping your body make more white blood cells, which protects your body from infection. It may also be used to help people who have been exposed to high doses of radiation. It can be used to help prepare your body before a stem cell transplant. It works by helping your bone marrow make and release stem cells into the blood. This medicine may be used for other purposes; ask your health care provider or pharmacist if you have questions. COMMON BRAND NAME(S): Neupogen, Nivestym, Releuko, Zarxio What should I tell my care team before I take this medication? They need to know if you have any of these conditions: History of blood diseases, such as sickle cell anemia Kidney disease Recent or ongoing radiation An unusual or allergic reaction to filgrastim, pegfilgrastim, latex, rubber, other medications, foods, dyes, or preservatives Pregnant or trying to get pregnant Breast-feeding How should I use this medication? This medication is injected under the skin or into a vein. It is usually given by your care team in a hospital or clinic setting. It may be given at home. If you get this medication at home, you will be taught how to prepare and give it. Use exactly as directed. Take it as directed on the prescription label at the same time every day. Keep taking it unless your care team tells you to stop. It is important that you put your used needles and syringes in a special sharps container. Do not put them in a trash can. If you do not have a sharps container, call your pharmacist or care team to get one. This medication comes with INSTRUCTIONS FOR USE. Ask your pharmacist for directions on how to use this medication. Read the information carefully. Talk to your pharmacist or care team if you have questions. Talk to your care team about the use of this  medication in children. While it may be prescribed for children for selected conditions, precautions do apply. Overdosage: If you think you have taken too much of this medicine contact a poison control center or emergency room at once. NOTE: This medicine is only for you. Do not share this medicine with others. What if I miss a dose? It is important not to miss any doses. Talk to your care team about what to do if you miss a dose. What may interact with this medication? Medications that may cause a release of neutrophils, such as lithium This list may not describe all possible interactions. Give your health care provider a list of all the medicines, herbs, non-prescription drugs, or dietary supplements you use. Also tell them if you smoke, drink alcohol, or use illegal drugs. Some items may interact with your medicine. What should I watch for while using this medication? Your condition will be monitored carefully while you are receiving this medication. You may need bloodwork while taking this medication. Talk to your care team about your risk of cancer. You may be more at risk for certain types of cancer if you take this medication. What side effects may I notice from receiving this medication? Side effects that you should report to your care team as soon as possible: Allergic reactions--skin rash, itching, hives, swelling of the face, lips, tongue, or throat Capillary leak syndrome--stomach or muscle pain, unusual weakness or fatigue, feeling faint or lightheaded, decrease in the amount of urine, swelling of the ankles, hands, or   feet, trouble breathing High white blood cell level--fever, fatigue, trouble breathing, night sweats, change in vision, weight loss Inflammation of the aorta--fever, fatigue, back, chest, or stomach pain, severe headache Kidney injury (glomerulonephritis)--decrease in the amount of urine, red or dark brown urine, foamy or bubbly urine, swelling of the ankles, hands, or  feet Shortness of breath or trouble breathing Spleen injury--pain in upper left stomach or shoulder Unusual bruising or bleeding Side effects that usually do not require medical attention (report to your care team if they continue or are bothersome): Back pain Bone pain Fatigue Fever Headache Nausea This list may not describe all possible side effects. Call your doctor for medical advice about side effects. You may report side effects to FDA at 1-800-FDA-1088. Where should I keep my medication? Keep out of the reach of children and pets. Keep this medication in the original packaging until you are ready to take it. Protect from light. See product for storage information. Each product may have different instructions. Get rid of any unused medication after the expiration date. To get rid of medications that are no longer needed or have expired: Take the medication to a medications take-back program. Check with your pharmacy or law enforcement to find a location. If you cannot return the medication, ask your pharmacist or care team how to get rid of this medication safely. NOTE: This sheet is a summary. It may not cover all possible information. If you have questions about this medicine, talk to your doctor, pharmacist, or health care provider.  2023 Elsevier/Gold Standard (2021-04-28 00:00:00)  

## 2022-05-11 ENCOUNTER — Telehealth: Payer: Self-pay | Admitting: Internal Medicine

## 2022-05-11 ENCOUNTER — Inpatient Hospital Stay: Payer: Commercial Managed Care - PPO

## 2022-05-11 ENCOUNTER — Other Ambulatory Visit: Payer: Self-pay

## 2022-05-11 VITALS — BP 136/58 | HR 99 | Temp 97.9°F | Resp 18

## 2022-05-11 DIAGNOSIS — T451X5A Adverse effect of antineoplastic and immunosuppressive drugs, initial encounter: Secondary | ICD-10-CM

## 2022-05-11 DIAGNOSIS — Z5112 Encounter for antineoplastic immunotherapy: Secondary | ICD-10-CM | POA: Diagnosis not present

## 2022-05-11 MED ORDER — FILGRASTIM-SNDZ 300 MCG/0.5ML IJ SOSY
300.0000 ug | PREFILLED_SYRINGE | Freq: Once | INTRAMUSCULAR | Status: AC
  Administered 2022-05-11: 300 ug via SUBCUTANEOUS
  Filled 2022-05-11: qty 0.5

## 2022-05-11 NOTE — Telephone Encounter (Signed)
Patient called to r/s 4/9 injection to earlier time due to childcare obligations. Patient rescheduled and notified.

## 2022-05-12 ENCOUNTER — Inpatient Hospital Stay: Payer: Commercial Managed Care - PPO

## 2022-05-12 VITALS — BP 101/63 | HR 102 | Temp 98.5°F | Resp 16

## 2022-05-12 DIAGNOSIS — D701 Agranulocytosis secondary to cancer chemotherapy: Secondary | ICD-10-CM

## 2022-05-12 DIAGNOSIS — Z5112 Encounter for antineoplastic immunotherapy: Secondary | ICD-10-CM | POA: Diagnosis not present

## 2022-05-12 MED ORDER — FILGRASTIM-SNDZ 300 MCG/0.5ML IJ SOSY
300.0000 ug | PREFILLED_SYRINGE | Freq: Once | INTRAMUSCULAR | Status: AC
  Administered 2022-05-12: 300 ug via SUBCUTANEOUS
  Filled 2022-05-12: qty 0.5

## 2022-05-14 MED FILL — Fosaprepitant Dimeglumine For IV Infusion 150 MG (Base Eq): INTRAVENOUS | Qty: 5 | Status: AC

## 2022-05-14 MED FILL — Dexamethasone Sodium Phosphate Inj 100 MG/10ML: INTRAMUSCULAR | Qty: 1 | Status: AC

## 2022-05-16 ENCOUNTER — Encounter: Payer: Self-pay | Admitting: Physician Assistant

## 2022-05-16 ENCOUNTER — Encounter: Payer: Self-pay | Admitting: Internal Medicine

## 2022-05-17 ENCOUNTER — Other Ambulatory Visit: Payer: Self-pay | Admitting: Medical Oncology

## 2022-05-17 ENCOUNTER — Inpatient Hospital Stay (HOSPITAL_BASED_OUTPATIENT_CLINIC_OR_DEPARTMENT_OTHER): Payer: Commercial Managed Care - PPO | Admitting: Internal Medicine

## 2022-05-17 ENCOUNTER — Other Ambulatory Visit: Payer: Self-pay

## 2022-05-17 ENCOUNTER — Inpatient Hospital Stay: Payer: Commercial Managed Care - PPO

## 2022-05-17 VITALS — BP 114/69 | HR 100 | Temp 98.2°F | Resp 15 | Wt 111.2 lb

## 2022-05-17 DIAGNOSIS — D649 Anemia, unspecified: Secondary | ICD-10-CM

## 2022-05-17 DIAGNOSIS — Z5112 Encounter for antineoplastic immunotherapy: Secondary | ICD-10-CM | POA: Diagnosis not present

## 2022-05-17 DIAGNOSIS — C3491 Malignant neoplasm of unspecified part of right bronchus or lung: Secondary | ICD-10-CM | POA: Diagnosis not present

## 2022-05-17 LAB — CBC WITH DIFFERENTIAL (CANCER CENTER ONLY)
Abs Immature Granulocytes: 0.01 10*3/uL (ref 0.00–0.07)
Basophils Absolute: 0 10*3/uL (ref 0.0–0.1)
Basophils Relative: 0 %
Eosinophils Absolute: 0 10*3/uL (ref 0.0–0.5)
Eosinophils Relative: 0 %
HCT: 23.8 % — ABNORMAL LOW (ref 36.0–46.0)
Hemoglobin: 8 g/dL — ABNORMAL LOW (ref 12.0–15.0)
Immature Granulocytes: 0 %
Lymphocytes Relative: 15 %
Lymphs Abs: 0.4 10*3/uL — ABNORMAL LOW (ref 0.7–4.0)
MCH: 33.3 pg (ref 26.0–34.0)
MCHC: 33.6 g/dL (ref 30.0–36.0)
MCV: 99.2 fL (ref 80.0–100.0)
Monocytes Absolute: 0.2 10*3/uL (ref 0.1–1.0)
Monocytes Relative: 8 %
Neutro Abs: 2.2 10*3/uL (ref 1.7–7.7)
Neutrophils Relative %: 77 %
Platelet Count: 72 10*3/uL — ABNORMAL LOW (ref 150–400)
RBC: 2.4 MIL/uL — ABNORMAL LOW (ref 3.87–5.11)
RDW: 17.2 % — ABNORMAL HIGH (ref 11.5–15.5)
WBC Count: 2.9 10*3/uL — ABNORMAL LOW (ref 4.0–10.5)
nRBC: 0 % (ref 0.0–0.2)

## 2022-05-17 LAB — CMP (CANCER CENTER ONLY)
ALT: 31 U/L (ref 0–44)
AST: 30 U/L (ref 15–41)
Albumin: 4 g/dL (ref 3.5–5.0)
Alkaline Phosphatase: 114 U/L (ref 38–126)
Anion gap: 9 (ref 5–15)
BUN: 9 mg/dL (ref 6–20)
CO2: 23 mmol/L (ref 22–32)
Calcium: 9.3 mg/dL (ref 8.9–10.3)
Chloride: 105 mmol/L (ref 98–111)
Creatinine: 0.8 mg/dL (ref 0.44–1.00)
GFR, Estimated: >60 mL/min (ref >60)
Glucose, Bld: 117 mg/dL — ABNORMAL HIGH (ref 70–99)
Potassium: 3.8 mmol/L (ref 3.5–5.1)
Sodium: 137 mmol/L (ref 135–145)
Total Bilirubin: 0.6 mg/dL (ref 0.3–1.2)
Total Protein: 7.4 g/dL (ref 6.5–8.1)

## 2022-05-17 LAB — ABO/RH: ABO/RH(D): B POS

## 2022-05-17 LAB — BPAM RBC: Unit Type and Rh: 7300

## 2022-05-17 LAB — TSH: TSH: 1.753 u[IU]/mL (ref 0.350–4.500)

## 2022-05-17 LAB — TYPE AND SCREEN

## 2022-05-17 LAB — PREPARE RBC (CROSSMATCH)

## 2022-05-17 MED ORDER — ACETAMINOPHEN 325 MG PO TABS
650.0000 mg | ORAL_TABLET | Freq: Once | ORAL | Status: AC
  Administered 2022-05-17: 650 mg via ORAL
  Filled 2022-05-17: qty 2

## 2022-05-17 MED ORDER — DIPHENHYDRAMINE HCL 25 MG PO CAPS: 25.0000 mg | ORAL_CAPSULE | Freq: Once | ORAL | Status: DC

## 2022-05-17 MED ORDER — SODIUM CHLORIDE 0.9% IV SOLUTION
250.0000 mL | Freq: Once | INTRAVENOUS | Status: AC
  Administered 2022-05-17: 250 mL via INTRAVENOUS

## 2022-05-17 NOTE — Progress Notes (Signed)
[facility] Telephone:(336) [phone]   Fax:(336) [phone]  OFFICE PROGRESS NOTE  Julie Panda, NP [address] [place] [address]  DIAGNOSIS: Recurrent/metastatic non-small cell lung cancer initially diagnosed as stage IIIB  (T1b, N3, M0) non-small cell lung cancer, adenocarcinoma she presented with right upper lobe nodule in addition to bulky right hilar, mediastinal, and left supraclavicular lymphadenopathy diagnosed in November 2022.  The patient had evidence for disease recurrence in the mediastinal and right supraclavicular lymphadenopathy in August 2023.  The patient had evidence of metastatic disease in February 2024 with several supraclavicular, thoracic and precarinal lymphadenopathy as well as upper abdominal lymph nodes and small liver lesion.  DETECTED ALTERATION(S) / BIOMARKER(S) % CFDNA OR AMPLIFICATION ASSOCIATED FDA-APPROVED THERAPIES CLINICAL TRIAL AVAILABILITY [id number] ND 0.5 5 50 100 4.7%  RHOAG17E ND 0.5 5 50 100 1.8%  CTNNB1S37C ND 0.5 5 50 100 1.9%  BIOMARKER ADDITIONAL DETAILS Tumor Mutational Burden (TMB) 19.02 mut/Mb MSI Status Stable (MSS) PD-L1 Tumor Proportion Score (TPS)* <1%  Biomarker Findings By [facility] on 05/06/2022 Tumor Mutational Burden - 17 Muts/Mb Microsatellite status - MS-Stable Genomic Findings For a complete list of the genes assayed, please refer to the Appendix. CTNNB1 S37C CRKL amplification MAP2K4 loss exons 2-11 TP53 V143A 8 Disease relevant genes with no reportable alterations: ALK, BRAF, EGFR, ERBB2, KRAS, MET, RET, ROS1  PDL1 0%   PRIOR THERAPY:  1) Concurrent chemoradiation with carboplatin for an AUC of 2 and paclitaxel 45 mg per metered squared.  First dose on 01/05/2021.  Status post 7 cycles of treatment.  Last dose was given February 16, 2021. 2) Consolidation immunotherapy with Imfinzi 1500 Mg IV every 4 weeks.  First dose March 25, 2021.  Status post 3 cycles.  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 3) Palliative radiotherapy to the enlarging right supraclavicular lymphadenopathy under the care of Dr. Roselind Messier expected to be completed on October 28, 2021.   CURRENT THERAPY: Palliative systemic chemotherapy with carboplatin for AUC of 5, Alimta 500 Mg/M2 and Keytruda 200 Mg IV every 3 weeks.  First dose April 05, 2022.  Status post 2 cycles.  INTERVAL HISTORY: Julie Nicholson 57 y.o. female returns to the clinic today for follow-up visit.  The patient is feeling fine today with no concerning complaints except for mild fatigue.  She denied having any current chest pain, shortness of breath but has dry cough with no hemoptysis.  She has no nausea, vomiting, diarrhea or constipation.  She has no headache or visual changes.  She denied having any recent weight loss or night sweats.  She continues to tolerate her treatment with systemic chemotherapy fairly well except for the fatigue.  She is here today for evaluation before starting cycle #3 of her treatment.    MEDICAL HISTORY: Past Medical History:  Diagnosis Date   Abdominal discomfort    Cancer (HCC)    Chronic headaches    due to allergies, sinus   COPD (chronic obstructive pulmonary disease) (HCC)    per 2012 chest xray   pt states she doesn not have this now (04/10/2013)   Deviated nasal septum    Eustachian tube dysfunction    GERD (gastroesophageal reflux disease)    occasional uses Tums / Rolaids   Hearing loss    right ear   High cholesterol    History of radiation therapy    right lung 01/07/2021-02/19/2021  Dr Antony Blackbird   Migraine    "  only once in a blue moon since RX'd allergy shots" (04/10/2013)   Pancreatitis 02/08/2013   Pneumonia    Rhinitis, allergic     ALLERGIES:  has no active allergies.  MEDICATIONS:  Current Outpatient Medications  Medication Sig Dispense Refill   acetaminophen (TYLENOL) 500 MG tablet Take 1,000 mg by mouth every 6  (six) hours as needed for moderate pain.     albuterol (PROVENTIL) (2.5 MG/3ML) 0.083% nebulizer solution albuterol sulfate 2.5 mg/3 mL (0.083 %) solution for nebulization  USE 1 VIAL IN NEBULIZER EVERY 6 HOURS AS NEEDED FOR WHEEZING OR SHORTNESS OF BREATH     aspirin EC 81 MG tablet Take 81 mg by mouth at bedtime.     atorvastatin (LIPITOR) 40 MG tablet Take 40 mg by mouth at bedtime.     benzonatate (TESSALON) 200 MG capsule Take 200 mg by mouth 3 (three) times daily as needed. (Patient not taking: Reported on 04/13/2022)     Biotin 5000 MCG TABS Take 5,000 mcg by mouth at bedtime.     buPROPion (WELLBUTRIN XL) 150 MG 24 hr tablet Take 150 mg by mouth every morning.     Ca Carbonate-Mag Hydroxide (ROLAIDS PO) Take 1 tablet by mouth daily as needed (heartburn).     famotidine (PEPCID) 40 MG tablet Take 40 mg by mouth daily.     fluticasone (FLONASE) 50 MCG/ACT nasal spray Place 2 sprays into both nostrils daily as needed for allergies.     folic acid (FOLVITE) 1 MG tablet Take 1 tablet (1 mg total) by mouth daily. 30 tablet 2   hydrocortisone 1 % ointment Apply 1 Application topically 2 (two) times daily. 30 g 0   linaCLOtide (LINZESS PO) Take by mouth.     loratadine (CLARITIN) 10 MG tablet TAKE 1 TABLET BY MOUTH EVERY DAY 90 tablet 1   magic mouthwash SOLN Take 5 mLs by mouth 4 (four) times daily as needed for mouth pain. 240 mL 0   Multiple Vitamins-Minerals (HAIR SKIN AND NAILS FORMULA PO) Take 1 tablet by mouth daily.     ondansetron (ZOFRAN-ODT) 8 MG disintegrating tablet Take 8 mg by mouth 3 (three) times daily as needed. (Patient not taking: Reported on 04/13/2022)     Oxycodone HCl 10 MG TABS Take 1 tablet by mouth 3 (three) times daily as needed (pain). (Patient not taking: Reported on 04/13/2022)     prochlorperazine (COMPAZINE) 10 MG tablet Take 1 tablet (10 mg total) by mouth every 6 (six) hours as needed. (Patient not taking: Reported on 04/13/2022) 30 tablet 2   rizatriptan  (MAXALT-MLT) 10 MG disintegrating tablet Take 10 mg by mouth as needed for migraine. May repeat in 2 hours if needed     sertraline (ZOLOFT) 50 MG tablet Take 1 tablet by mouth daily.     Tiotropium Bromide-Olodaterol (STIOLTO RESPIMAT) 2.5-2.5 MCG/ACT AERS Inhale 2 puffs into the lungs daily. 4 g 3   No current facility-administered medications for this visit.    SURGICAL HISTORY:  Past Surgical History:  Procedure Laterality Date   ABDOMINAL HYSTERECTOMY  1995   tx endometriosis, both ovaries removed   APPENDECTOMY  late 1990's   BIOPSY  02/26/2021   Procedure: BIOPSY;  Surgeon: Rachael Fee, MD;  Location: [facility] ENDOSCOPY;  Service: Endoscopy;;   BRONCHIAL BRUSHINGS  12/15/2020   Procedure: BRONCHIAL BRUSHINGS;  Surgeon: Leslye Peer, MD;  Location: [facility] ENDOSCOPY;  Service: Cardiopulmonary;;   BRONCHIAL NEEDLE ASPIRATION BIOPSY  12/15/2020   Procedure: BRONCHIAL  NEEDLE ASPIRATION BIOPSIES;  Surgeon: Leslye Peer, MD;  Location: [facility] ENDOSCOPY;  Service: Cardiopulmonary;;   CHOLECYSTECTOMY  04/10/2013   CHOLECYSTECTOMY N/A 04/10/2013   Procedure: LAPAROSCOPIC CHOLECYSTECTOMY WITH INTRAOPERATIVE CHOLANGIOGRAM;  Surgeon: Adolph Pollack, MD;  Location: [facility] OR;  Service: General;  Laterality: N/A;   ELECTROMAGNETIC NAVIGATION BROCHOSCOPY  12/15/2020   Procedure: ELECTROMAGNETIC NAVIGATION BRONCHOSCOPY;  Surgeon: Leslye Peer, MD;  Location: [facility] ENDOSCOPY;  Service: Cardiopulmonary;;   ESOPHAGOGASTRODUODENOSCOPY (EGD) WITH PROPOFOL N/A 02/26/2021   Procedure: ESOPHAGOGASTRODUODENOSCOPY (EGD) WITH PROPOFOL;  Surgeon: Rachael Fee, MD;  Location: [facility] ENDOSCOPY;  Service: Endoscopy;  Laterality: N/A;   EUS N/A 02/16/2013   Procedure: UPPER ENDOSCOPIC ULTRASOUND (EUS) LINEAR;  Surgeon: Rachael Fee, MD;  Location: [facility] ENDOSCOPY;  Service: Endoscopy;  Laterality: N/A;  radial linear   KNEE ARTHROSCOPY Right 1980's   "cartilage OR"   LAPAROSCOPIC ENDOMETRIOSIS FULGURATION  1980's    MYRINGOTOMY WITH TUBE PLACEMENT Right 07/13/2018   Procedure: MYRINGOTOMY WITH TUBE PLACEMENT;  Surgeon: Vernie Murders, MD;  Location: [facility];  Service: ENT;  Laterality: Right;   MYRINGOTOMY WITH TUBE PLACEMENT Right 06/04/2021   Procedure: MYRINGOTOMY WITH BUTTERFLY TUBE PLACEMENT;  Surgeon: Vernie Murders, MD;  Location: [facility];  Service: ENT;  Laterality: Right;   NASOPHARYNGOSCOPY EUSTATION TUBE BALLOON DILATION Right 07/13/2018   Procedure: NASOPHARYNGOSCOPY EUSTATION TUBE BALLOON DILATION;  Surgeon: Vernie Murders, MD;  Location: [facility];  Service: ENT;  Laterality: Right;   TONSILLECTOMY AND ADENOIDECTOMY  ~ 1980   adenoidectomy   TUBAL LIGATION  ~ 1987   TURBINATE REDUCTION Right 07/13/2018   Procedure: OUTFRACTURE TURBINATE;  Surgeon: Vernie Murders, MD;  Location: [facility] SURGERY [facility]TR;  Service: ENT;  Laterality: Right;   VIDEO BRONCHOSCOPY WITH ENDOBRONCHIAL ULTRASOUND N/A 12/15/2020   Procedure: ROBOTIC VIDEO BRONCHOSCOPY WITH ENDOBRONCHIAL ULTRASOUND;  Surgeon: Leslye Peer, MD;  Location: [facility] ENDOSCOPY;  Service: Cardiopulmonary;  Laterality: N/A;   WRIST SURGERY Left    w/plate    REVIEW OF SYSTEMS:  Constitutional: positive for fatigue Eyes: negative Ears, nose, mouth, throat, and face: negative Respiratory: positive for cough Cardiovascular: negative Gastrointestinal: negative Genitourinary:negative Integument/breast: negative Hematologic/lymphatic: negative Musculoskeletal:negative Neurological: negative Behavioral/Psych: negative Endocrine: negative Allergic/Immunologic: negative   PHYSICAL EXAMINATION: General appearance: alert, cooperative, fatigued, and no distress Head: Normocephalic, without obvious abnormality, atraumatic Neck: moderate anterior cervical adenopathy, no JVD, supple, symmetrical, trachea midline, and thyroid not enlarged, symmetric, no tenderness/mass/nodules Lymph nodes: Cervical, supraclavicular, and  axillary nodes normal. Resp: clear to auscultation bilaterally Back: symmetric, no curvature. ROM normal. No CVA tenderness. Cardio: regular rate and rhythm, S1, S2 normal, no murmur, click, rub or gallop GI: soft, non-tender; bowel sounds normal; no masses,  no organomegaly Extremities: extremities normal, atraumatic, no cyanosis or edema Neurologic: Alert and oriented X 3, normal strength and tone. Normal symmetric reflexes. Normal coordination and gait  ECOG PERFORMANCE STATUS: 1 - Symptomatic but completely ambulatory  Blood pressure 114/69, pulse 100, temperature 98.2 F (36.8 C), temperature source Oral, resp. rate 15, weight 111 lb 3.2 oz (50.4 kg), SpO2 99 %.  LABORATORY DATA: Lab Results  Component Value Date   WBC 2.9 (L) 05/17/2022   HGB 8.0 (L) 05/17/2022   HCT 23.8 (L) 05/17/2022   MCV 99.2 05/17/2022   PLT 72 (L) 05/17/2022      Chemistry      Component Value Date/Time   NA 141 05/10/2022 0826   K 3.7 05/10/2022 0826   CL 108 05/10/2022 0826  CO2 26 05/10/2022 0826   BUN 12 05/10/2022 0826   CREATININE 0.97 05/10/2022 0826      Component Value Date/Time   CALCIUM 9.5 05/10/2022 0826   ALKPHOS 99 05/10/2022 0826   AST 24 05/10/2022 0826   ALT 33 05/10/2022 0826   BILITOT 0.4 05/10/2022 0826       RADIOGRAPHIC STUDIES: No results found.  ASSESSMENT AND PLAN: This is a very pleasant 57 years old white female with stage IIIB (T1b, N3, M0) non-small cell lung cancer, adenocarcinoma diagnosed in November 2022 with no actionable mutation and negative PD-L1 expression. The patient completed a course of concurrent chemoradiation with weekly carboplatin for AUC of 2 and paclitaxel 45 Mg/M2 status post 7 cycles.  She has been tolerating her treatment well except for the mild odynophagia and skin burns. She was also recently admitted to the hospital complaining of dysphagia and odynophagia secondary to radiation induced esophagitis.  She is feeling much better but  continues to have residual dysphagia.  She is followed by gastroenterology and was seen by Dr. Christella Hartigan during her hospitalization. Her scan showed improvement of her disease. I recommended for the patient treatment with consolidation immunotherapy with Imfinzi 1500 Mg IV every 4 weeks.  Status post 3 cycles.  Last dose was given in April 2023.  Her treatment was discontinued secondary to suspicious immunotherapy mediated pneumonitis with significant shortness of breath at that time and she was treated with a tapered dose of prednisone. Unfortunately her scan showed evidence for disease recurrence with enlargement of lower right cervical lymph nodes as well as mediastinal lymphadenopathy.  She has palpable right cervical lymphadenopathy. 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 The patient underwent ultrasound-guided core biopsy of the right supraclavicular lymph nodes but the final pathology showed necrotic tumor cells with complete coagulative necrosis with focal fibrous tissue and histiocytic reaction.  She also had MRI of the brain that showed no evidence of metastatic disease to the brain. The patient was seen by Dr. Roselind Messier and started palliative radiotherapy to the enlarging right supraclavicular lymphadenopathy.  This was completed on October 28, 2021. The patient was found to have metastatic disease in February 2024 with several lymphadenopathy in the chest as well as supraclavicular, upper abdomen as well as suspicious small liver metastasis. The patient also had ultrasound-guided core biopsy of a left supraclavicular lymph node yesterday and the final pathology was  consistent with metastatic moderate to poorly differentiated adenocarcinoma of the lung primary.  I will send the tissue biopsy to [facility] for molecular studies. She is currently undergoing systemic chemotherapy with carboplatin for AUC of 5, Alimta 500 Mg/M2 and Keytruda 200 Mg IV every 3 weeks status post 2 cycles.   She has been tolerating her treatment well with no concerning adverse effect except for fatigue secondary to chemotherapy-induced anemia. Her CBC today showed persistent anemia and thrombocytopenia. I will delay her treatment with cycle number 3 by 1 week until recovery of her platelets count.  I will also reduced her dose of carboplatin to AUC of 4 and Alimta 400 Mg/M2 starting from cycle #3. For the chemotherapy-induced anemia, will arrange for the patient to receive 1 unit of PRBCs transfusion today. She will come back for follow-up  visit next week for evaluation before starting cycle #3. The patient was advised to call immediately if she has any concerning symptoms in the interval. The patient voices understanding of current disease status and treatment options and is in agreement with the current care plan.  All questions were answered. The patient knows to call the clinic with any problems, questions or concerns. We can certainly see the patient much sooner if necessary.  The total time spent in the appointment was 30 minutes.  Disclaimer: This note was dictated with voice recognition software. Similar sounding words can inadvertently be transcribed and may not be corrected upon review.

## 2022-05-17 NOTE — Patient Instructions (Signed)
Blood Transfusion, Adult, Care After The following information offers guidance on how to care for yourself after your procedure. Your health care provider may also give you more specific instructions. If you have problems or questions, contact your health care provider. What can I expect after the procedure? After the procedure, it is common to have: Bruising and soreness where the IV was inserted. A headache. Follow these instructions at home: IV insertion site care     Follow instructions from your health care provider about how to take care of your IV insertion site. Make sure you: Wash your hands with soap and water for at least 20 seconds before and after you change your bandage (dressing). If soap and water are not available, use hand sanitizer. Change your dressing as told by your health care provider. Check your IV insertion site every day for signs of infection. Check for: Redness, swelling, or pain. Bleeding from the site. Warmth. Pus or a bad smell. General instructions Take over-the-counter and prescription medicines only as told by your health care provider. Rest as told by your health care provider. Return to your normal activities as told by your health care provider. Keep all follow-up visits. Lab tests may need to be done at certain periods to recheck your blood counts. Contact a health care provider if: You have itching or red, swollen areas of skin (hives). You have a fever or chills. You have pain in the head, back, or chest. You feel anxious or you feel weak after doing your normal activities. You have redness, swelling, warmth, or pain around the IV insertion site. You have blood coming from the IV insertion site that does not stop with pressure. You have pus or a bad smell coming from your IV insertion site. If you received your blood transfusion in an outpatient setting, you will be told whom to contact to report any reactions. Get help right away if: You  have symptoms of a serious allergic or immune system reaction, including: Trouble breathing or shortness of breath. Swelling of the face, feeling flushed, or widespread rash. Dark urine or blood in the urine. Fast heartbeat. These symptoms may be an emergency. Get help right away. Call 911. Do not wait to see if the symptoms will go away. Do not drive yourself to the hospital. Summary Bruising and soreness around the IV insertion site are common. Check your IV insertion site every day for signs of infection. Rest as told by your health care provider. Return to your normal activities as told by your health care provider. Get help right away for symptoms of a serious allergic or immune system reaction to the blood transfusion. This information is not intended to replace advice given to you by your health care provider. Make sure you discuss any questions you have with your health care provider. Document Revised: 04/17/2021 Document Reviewed: 04/17/2021 Elsevier Patient Education  2023 Elsevier Inc.  

## 2022-05-18 LAB — TYPE AND SCREEN
ABO/RH(D): B POS
Antibody Screen: NEGATIVE
Unit division: 0

## 2022-05-18 LAB — BPAM RBC
Blood Product Expiration Date: 202405082359
ISSUE DATE / TIME: 202404151254

## 2022-05-18 LAB — T4: T4, Total: 10.4 ug/dL (ref 4.5–12.0)

## 2022-05-20 ENCOUNTER — Telehealth: Payer: Self-pay | Admitting: Medical Oncology

## 2022-05-20 NOTE — Telephone Encounter (Signed)
Schedule message sent to r/s chemo.

## 2022-05-21 ENCOUNTER — Encounter: Payer: Self-pay | Admitting: Emergency Medicine

## 2022-05-21 ENCOUNTER — Ambulatory Visit (INDEPENDENT_AMBULATORY_CARE_PROVIDER_SITE_OTHER): Payer: Commercial Managed Care - PPO | Admitting: Emergency Medicine

## 2022-05-21 ENCOUNTER — Other Ambulatory Visit: Payer: Self-pay | Admitting: Internal Medicine

## 2022-05-21 VITALS — BP 120/68 | HR 97 | Temp 98.1°F | Ht 62.0 in | Wt 109.0 lb

## 2022-05-21 DIAGNOSIS — J3089 Other allergic rhinitis: Secondary | ICD-10-CM | POA: Diagnosis not present

## 2022-05-21 DIAGNOSIS — J449 Chronic obstructive pulmonary disease, unspecified: Secondary | ICD-10-CM | POA: Diagnosis not present

## 2022-05-21 DIAGNOSIS — Z720 Tobacco use: Secondary | ICD-10-CM

## 2022-05-21 DIAGNOSIS — C3491 Malignant neoplasm of unspecified part of right bronchus or lung: Secondary | ICD-10-CM

## 2022-05-21 NOTE — Progress Notes (Signed)
   Subjective:    Patient ID: Julie Nicholson, female    DOB: January 10, 1966, 57 y.o.   MRN: [id number]  HPI  ROV 11/11/2021 --Julie Nicholson is 14 with a history of right upper lobe mediastinal adenocarcinoma that was treated with chemoradiation, then Imfinzi that was stopped due to associated pneumonitis.  She has mild obstructive lung disease.  We have been managing her on Stiolto.  She uses albuterol approximately. She reports today that she is having cough ever since XRT, non-productive. Having nasal congestion and obstruction for the last several days - sick contact from daughter who had a URI. She is on flonase, loratadine. Remains on Stiolto. Rare albuterol use. Activity level remains good. She did pulm rehab and did benefit, no desaturations.  Flu shot up to date. Has not had the covid vaccine yet  ROV 05/21/2022 --57 year old woman with a history of right upper lobe adenocarcinoma originally diagnosed 12/2020 with subsequent recurrence and evidence for stage IV disease (confirmed on left supraclavicular lymph node 04/12/2022).  She underwent chemoradiation, Imfinzi (stopped due to possible pneumonitis) and is on palliative systemic chemotherapy with carboplatin, Alimta, Keytruda following with Dr. Arbutus Ped.  She has COPD and is on Stiolto.  Has not needed albuterol. Allergic rhinitis on Flonase prn, loratadine. She is feeling a bit wiped out and fatigued. She is not smoking, remains on wellbutrin. She has been depressed, is also on zoloft.    Review of Systems As per HPI      Objective:   Physical Exam Vitals:   05/21/22 1131  BP: 120/68  Pulse: 97  Temp: 98.1 F (36.7 C)  TempSrc: Oral  SpO2: 98%  Weight: 109 lb (49.4 kg)  Height:  (1.575 m)    Gen: Pleasant, well-nourished, in no distress,  normal affect  ENT: No lesions,  mouth clear,  oropharynx clear, no postnasal drip  Neck: No JVD, no stridor  Lungs: No use of accessory muscles, few focal R insp crackles, no wheeze on  forced expiration  Cardiovascular: RRR, heart sounds normal, no murmur or gallops, no peripheral edema  Musculoskeletal: No deformities, no cyanosis or clubbing  Neuro: alert, awake, non focal  Skin: Warm, no lesions or rash       Assessment & Plan:   COPD (chronic obstructive pulmonary disease) (HCC) Managed on Stiolto.  Doing well.  No flares, antibiotics, prednisone.  She never uses albuterol.  She has been fatigued which is limiting her but she is not being limited by shortness of breath.  Plan to continue same regimen  Adenocarcinoma of right lung, stage 4 (HCC) Back on therapy as per Dr. Asa Lente plans.  Allergic rhinitis Continue loratadine.  She has Flonase available to use if needed.  Tobacco use She has not restarted smoking but has been tempted to do so.  We left her on Wellbutrin after it was initially started for smoking cessation.  It has benefited her with regard to her depression.  She states that she is been having more depressive symptoms since her cancer recurrence.  Zoloft was added by her PCP.  I have encouraged her to talk to her PCP about whether any changes might be helpful, either increasing the Wellbutrin or the Zoloft.    Levy Pupa, MD, PhD 05/21/2022, 11:56 AM [facility] [phone] or if no answer before 7:00PM call [phone] For any issues after 7:00PM please call eLink [phone]

## 2022-05-21 NOTE — Assessment & Plan Note (Signed)
Back on therapy as per Dr. Asa Lente plans.

## 2022-05-21 NOTE — Assessment & Plan Note (Signed)
Managed on Stiolto.  Doing well.  No flares, antibiotics, prednisone.  She never uses albuterol.  She has been fatigued which is limiting her but she is not being limited by shortness of breath.  Plan to continue same regimen

## 2022-05-21 NOTE — Assessment & Plan Note (Signed)
She has not restarted smoking but has been tempted to do so.  We left her on Wellbutrin after it was initially started for smoking cessation.  It has benefited her with regard to her depression.  She states that she is been having more depressive symptoms since her cancer recurrence.  Zoloft was added by her PCP.  I have encouraged her to talk to her PCP about whether any changes might be helpful, either increasing the Wellbutrin or the Zoloft.

## 2022-05-21 NOTE — Patient Instructions (Signed)
Please continue your Stiolto 2 puffs once daily. Keep albuterol available to use 2 puffs up to every 4 hours if needed for shortness of breath, chest tightness, wheezing.  Continue loratadine once daily Keep your fluticasone nasal spray available to use 2 sprays each nostril once daily if you need it for congestion, drainage and allergies Continue your Wellbutrin and Zoloft as you have been taking them for now.  Contact K. Millsaps, NP, to discuss whether any changes to this regimen might be helpful for you. Continue to follow with Dr. Arbutus Ped as planned Follow Dr. Delton Coombes in 6 months or sooner if you have any problems.

## 2022-05-21 NOTE — Assessment & Plan Note (Signed)
Continue loratadine.  She has Flonase available to use if needed.

## 2022-05-22 ENCOUNTER — Other Ambulatory Visit: Payer: Self-pay

## 2022-05-24 ENCOUNTER — Encounter: Payer: Self-pay | Admitting: Internal Medicine

## 2022-05-24 ENCOUNTER — Encounter: Payer: Self-pay | Admitting: Physician Assistant

## 2022-05-24 ENCOUNTER — Inpatient Hospital Stay: Payer: Commercial Managed Care - PPO

## 2022-05-24 ENCOUNTER — Other Ambulatory Visit: Payer: Self-pay | Admitting: Physician Assistant

## 2022-05-24 ENCOUNTER — Other Ambulatory Visit: Payer: Self-pay

## 2022-05-24 DIAGNOSIS — C3411 Malignant neoplasm of upper lobe, right bronchus or lung: Secondary | ICD-10-CM

## 2022-05-24 DIAGNOSIS — C3491 Malignant neoplasm of unspecified part of right bronchus or lung: Secondary | ICD-10-CM

## 2022-05-24 DIAGNOSIS — Z5112 Encounter for antineoplastic immunotherapy: Secondary | ICD-10-CM | POA: Diagnosis not present

## 2022-05-24 LAB — CMP (CANCER CENTER ONLY)
ALT: 24 U/L (ref 0–44)
AST: 21 U/L (ref 15–41)
Albumin: 4.3 g/dL (ref 3.5–5.0)
Alkaline Phosphatase: 104 U/L (ref 38–126)
Anion gap: 8 (ref 5–15)
BUN: 13 mg/dL (ref 6–20)
CO2: 27 mmol/L (ref 22–32)
Calcium: 10.1 mg/dL (ref 8.9–10.3)
Chloride: 106 mmol/L (ref 98–111)
Creatinine: 0.86 mg/dL (ref 0.44–1.00)
GFR, Estimated: >60 mL/min (ref >60)
Glucose, Bld: 103 mg/dL — ABNORMAL HIGH (ref 70–99)
Potassium: 3.9 mmol/L (ref 3.5–5.1)
Sodium: 141 mmol/L (ref 135–145)
Total Bilirubin: 0.5 mg/dL (ref 0.3–1.2)
Total Protein: 7.7 g/dL (ref 6.5–8.1)

## 2022-05-24 LAB — CBC WITH DIFFERENTIAL (CANCER CENTER ONLY)
Abs Immature Granulocytes: 0.01 10*3/uL (ref 0.00–0.07)
Basophils Absolute: 0 10*3/uL (ref 0.0–0.1)
Basophils Relative: 1 %
Eosinophils Absolute: 0 10*3/uL (ref 0.0–0.5)
Eosinophils Relative: 1 %
HCT: 34.5 % — ABNORMAL LOW (ref 36.0–46.0)
Hemoglobin: 11.9 g/dL — ABNORMAL LOW (ref 12.0–15.0)
Immature Granulocytes: 0 %
Lymphocytes Relative: 21 %
Lymphs Abs: 0.6 10*3/uL — ABNORMAL LOW (ref 0.7–4.0)
MCH: 34.1 pg — ABNORMAL HIGH (ref 26.0–34.0)
MCHC: 34.5 g/dL (ref 30.0–36.0)
MCV: 98.9 fL (ref 80.0–100.0)
Monocytes Absolute: 0.3 10*3/uL (ref 0.1–1.0)
Monocytes Relative: 10 %
Neutro Abs: 1.9 10*3/uL (ref 1.7–7.7)
Neutrophils Relative %: 67 %
Platelet Count: 219 10*3/uL (ref 150–400)
RBC: 3.49 MIL/uL — ABNORMAL LOW (ref 3.87–5.11)
RDW: 17 % — ABNORMAL HIGH (ref 11.5–15.5)
WBC Count: 2.8 10*3/uL — ABNORMAL LOW (ref 4.0–10.5)
nRBC: 0 % (ref 0.0–0.2)

## 2022-05-25 ENCOUNTER — Other Ambulatory Visit: Payer: Self-pay

## 2022-05-25 ENCOUNTER — Telehealth: Payer: Self-pay | Admitting: Internal Medicine

## 2022-05-25 NOTE — Telephone Encounter (Signed)
Scheduled per 04/18 in-basket message and work-queue request. Patient has been called and notified.

## 2022-05-26 ENCOUNTER — Other Ambulatory Visit: Payer: Self-pay | Admitting: Internal Medicine

## 2022-05-26 ENCOUNTER — Other Ambulatory Visit: Payer: Self-pay

## 2022-05-26 ENCOUNTER — Telehealth: Payer: Self-pay | Admitting: Internal Medicine

## 2022-05-26 ENCOUNTER — Inpatient Hospital Stay: Payer: Commercial Managed Care - PPO

## 2022-05-26 ENCOUNTER — Encounter: Payer: Self-pay | Admitting: Physician Assistant

## 2022-05-26 ENCOUNTER — Telehealth: Payer: Self-pay | Admitting: Medical Oncology

## 2022-05-26 ENCOUNTER — Other Ambulatory Visit: Payer: Self-pay | Admitting: Physician Assistant

## 2022-05-26 ENCOUNTER — Other Ambulatory Visit: Payer: Commercial Managed Care - PPO

## 2022-05-26 VITALS — BP 108/63 | HR 93 | Temp 98.0°F | Resp 18 | Wt 108.5 lb

## 2022-05-26 DIAGNOSIS — C3491 Malignant neoplasm of unspecified part of right bronchus or lung: Secondary | ICD-10-CM

## 2022-05-26 DIAGNOSIS — C3490 Malignant neoplasm of unspecified part of unspecified bronchus or lung: Secondary | ICD-10-CM

## 2022-05-26 DIAGNOSIS — Z5112 Encounter for antineoplastic immunotherapy: Secondary | ICD-10-CM | POA: Diagnosis not present

## 2022-05-26 MED ORDER — SODIUM CHLORIDE 0.9 % IV SOLN
200.0000 mg | Freq: Once | INTRAVENOUS | Status: AC
  Administered 2022-05-26: 200 mg via INTRAVENOUS
  Filled 2022-05-26: qty 200

## 2022-05-26 MED ORDER — SODIUM CHLORIDE 0.9 % IV SOLN: Freq: Once | INTRAVENOUS | Status: AC

## 2022-05-26 MED ORDER — CYANOCOBALAMIN 1000 MCG/ML IJ SOLN
1000.0000 ug | Freq: Once | INTRAMUSCULAR | Status: AC
  Administered 2022-05-26: 1000 ug via INTRAMUSCULAR
  Filled 2022-05-26: qty 1

## 2022-05-26 MED ORDER — SODIUM CHLORIDE 0.9 % IV SOLN
400.0000 mg/m2 | Freq: Once | INTRAVENOUS | Status: AC
  Administered 2022-05-26: 600 mg via INTRAVENOUS
  Filled 2022-05-26: qty 24

## 2022-05-26 MED ORDER — SODIUM CHLORIDE 0.9 % IV SOLN
150.0000 mg | Freq: Once | INTRAVENOUS | Status: AC
  Administered 2022-05-26: 150 mg via INTRAVENOUS
  Filled 2022-05-26: qty 150

## 2022-05-26 MED ORDER — SODIUM CHLORIDE 0.9 % IV SOLN
302.8000 mg | Freq: Once | INTRAVENOUS | Status: AC
  Administered 2022-05-26: 300 mg via INTRAVENOUS
  Filled 2022-05-26: qty 30

## 2022-05-26 MED ORDER — SODIUM CHLORIDE 0.9 % IV SOLN
10.0000 mg | Freq: Once | INTRAVENOUS | Status: AC
  Administered 2022-05-26: 10 mg via INTRAVENOUS
  Filled 2022-05-26: qty 10

## 2022-05-26 MED ORDER — PALONOSETRON HCL INJECTION 0.25 MG/5ML
0.2500 mg | Freq: Once | INTRAVENOUS | Status: AC
  Administered 2022-05-26: 0.25 mg via INTRAVENOUS
  Filled 2022-05-26: qty 5

## 2022-05-26 NOTE — Progress Notes (Signed)
OK to proceed w/ tx today w/o being seen by provider per [facility]  Ebony Hail, Pharm.D., CPP 05/26/2022@2 :32 PM

## 2022-05-26 NOTE — Telephone Encounter (Signed)
  The R lateral rib pain resolved ,but now the pain is on left lateral side and it woke her up last night. She said  ibuprofen helped.  When is her next scan?   I told her she is ok to get treatment today .

## 2022-05-26 NOTE — Patient Instructions (Signed)
[facility]  Discharge Instructions: Thank you for choosing [facility] to provide your oncology and hematology care.   If you have a lab appointment with the Cancer Center, please go directly to the Cancer Center and check in at the registration area.   Wear comfortable clothing and clothing appropriate for easy access to any Portacath or PICC line.   We strive to give you quality time with your provider. You may need to reschedule your appointment if you arrive late (15 or more minutes).  Arriving late affects you and other patients whose appointments are after yours.  Also, if you miss three or more appointments without notifying the office, you may be dismissed from the clinic at the provider's discretion.      For prescription refill requests, have your pharmacy contact our office and allow 72 hours for refills to be completed.    Today you received the following chemotherapy and/or immunotherapy agents Keytruda, Alimta, Carboplatin.      To help prevent nausea and vomiting after your treatment, we encourage you to take your nausea medication as directed.  BELOW ARE SYMPTOMS THAT SHOULD BE REPORTED IMMEDIATELY: *FEVER GREATER THAN 100.4 F (38 C) OR HIGHER *CHILLS OR SWEATING *NAUSEA AND VOMITING THAT IS NOT CONTROLLED WITH YOUR NAUSEA MEDICATION *UNUSUAL SHORTNESS OF BREATH *UNUSUAL BRUISING OR BLEEDING *URINARY PROBLEMS (pain or burning when urinating, or frequent urination) *BOWEL PROBLEMS (unusual diarrhea, constipation, pain near the anus) TENDERNESS IN MOUTH AND THROAT WITH OR WITHOUT PRESENCE OF ULCERS (sore throat, sores in mouth, or a toothache) UNUSUAL RASH, SWELLING OR PAIN  UNUSUAL VAGINAL DISCHARGE OR ITCHING   Items with * indicate a potential emergency and should be followed up as soon as possible or go to the Emergency Department if any problems should occur.  Please show the CHEMOTHERAPY ALERT CARD or IMMUNOTHERAPY  ALERT CARD at check-in to the Emergency Department and triage nurse.  Should you have questions after your visit or need to cancel or reschedule your appointment, please contact [facility]  Dept: [phone]  and follow the prompts.  Office hours are 8:00 a.m. to 4:30 p.m. Monday - Friday. Please note that voicemails left after 4:00 p.m. may not be returned until the following business day.  We are closed weekends and major holidays. You have access to a nurse at all times for urgent questions. Please call the main number to the clinic Dept: [phone] and follow the prompts.   For any non-urgent questions, you may also contact your provider using MyChart. We now offer e-Visits for anyone 18 and older to request care online for non-urgent symptoms. For details visit [email or web address].   Also download the MyChart app! Go to the app store, search "MyChart", open the app, select [facility], and log in with your MyChart username and password.   

## 2022-05-26 NOTE — Progress Notes (Signed)
The patient was seen in the infusion room today.  Her thrombocytopenia recovered from last week.  She denies any major changes in her health since last being seen by Dr. Arbutus Ped.  She mentions that last week she was assembling furniture and felt a pop in her right lateral rib.  Last night, she had discomfort in her left lateral rib for which she took ibuprofen with improvement in her symptoms.  Her symptoms are exacerbated by moving.  She will use Tylenol if needed at this time and heating pads. she will be scheduled for a restaging CT scan of the neck, chest, abdomen pelvis in the next few weeks to follow-up on her malignancy.  She does feel like she has improvement in her cervical lymphadenopathy.  The patient also is wondering if her weekly lab appointments could be changed to the morning.  I will send a scheduling message.  She is in the infusion room receiving cycle #3 today is scheduled.

## 2022-05-31 ENCOUNTER — Other Ambulatory Visit: Payer: Commercial Managed Care - PPO

## 2022-06-01 ENCOUNTER — Other Ambulatory Visit: Payer: Self-pay

## 2022-06-02 ENCOUNTER — Encounter: Payer: Self-pay | Admitting: Physician Assistant

## 2022-06-02 ENCOUNTER — Telehealth: Payer: Self-pay | Admitting: Internal Medicine

## 2022-06-02 ENCOUNTER — Telehealth: Payer: Self-pay

## 2022-06-02 ENCOUNTER — Other Ambulatory Visit: Payer: Self-pay | Admitting: Physician Assistant

## 2022-06-02 ENCOUNTER — Inpatient Hospital Stay: Payer: Commercial Managed Care - PPO

## 2022-06-02 ENCOUNTER — Encounter: Payer: Self-pay | Admitting: Internal Medicine

## 2022-06-02 ENCOUNTER — Inpatient Hospital Stay: Payer: Commercial Managed Care - PPO | Attending: Internal Medicine

## 2022-06-02 VITALS — BP 114/69 | HR 97 | Temp 98.4°F | Resp 16

## 2022-06-02 DIAGNOSIS — Z5112 Encounter for antineoplastic immunotherapy: Secondary | ICD-10-CM | POA: Diagnosis present

## 2022-06-02 DIAGNOSIS — Z5111 Encounter for antineoplastic chemotherapy: Secondary | ICD-10-CM | POA: Diagnosis present

## 2022-06-02 DIAGNOSIS — R1310 Dysphagia, unspecified: Secondary | ICD-10-CM | POA: Insufficient documentation

## 2022-06-02 DIAGNOSIS — C770 Secondary and unspecified malignant neoplasm of lymph nodes of head, face and neck: Secondary | ICD-10-CM | POA: Insufficient documentation

## 2022-06-02 DIAGNOSIS — C3411 Malignant neoplasm of upper lobe, right bronchus or lung: Secondary | ICD-10-CM | POA: Diagnosis present

## 2022-06-02 DIAGNOSIS — C787 Secondary malignant neoplasm of liver and intrahepatic bile duct: Secondary | ICD-10-CM | POA: Insufficient documentation

## 2022-06-02 DIAGNOSIS — Z5189 Encounter for other specified aftercare: Secondary | ICD-10-CM | POA: Diagnosis not present

## 2022-06-02 DIAGNOSIS — D701 Agranulocytosis secondary to cancer chemotherapy: Secondary | ICD-10-CM

## 2022-06-02 LAB — CBC WITH DIFFERENTIAL (CANCER CENTER ONLY)
Abs Immature Granulocytes: 0.01 10*3/uL (ref 0.00–0.07)
Basophils Absolute: 0 10*3/uL (ref 0.0–0.1)
Basophils Relative: 1 %
Eosinophils Absolute: 0 10*3/uL (ref 0.0–0.5)
Eosinophils Relative: 2 %
HCT: 30.9 % — ABNORMAL LOW (ref 36.0–46.0)
Hemoglobin: 10.6 g/dL — ABNORMAL LOW (ref 12.0–15.0)
Immature Granulocytes: 1 %
Lymphocytes Relative: 36 %
Lymphs Abs: 0.5 10*3/uL — ABNORMAL LOW (ref 0.7–4.0)
MCH: 34.3 pg — ABNORMAL HIGH (ref 26.0–34.0)
MCHC: 34.3 g/dL (ref 30.0–36.0)
MCV: 100 fL (ref 80.0–100.0)
Monocytes Absolute: 0.3 10*3/uL (ref 0.1–1.0)
Monocytes Relative: 20 %
Neutro Abs: 0.5 10*3/uL — ABNORMAL LOW (ref 1.7–7.7)
Neutrophils Relative %: 40 %
Platelet Count: 161 10*3/uL (ref 150–400)
RBC: 3.09 MIL/uL — ABNORMAL LOW (ref 3.87–5.11)
RDW: 15.9 % — ABNORMAL HIGH (ref 11.5–15.5)
Smear Review: NORMAL
WBC Count: 1.3 10*3/uL — ABNORMAL LOW (ref 4.0–10.5)
nRBC: 0 % (ref 0.0–0.2)

## 2022-06-02 LAB — CMP (CANCER CENTER ONLY)
ALT: 42 U/L (ref 0–44)
AST: 28 U/L (ref 15–41)
Albumin: 4 g/dL (ref 3.5–5.0)
Alkaline Phosphatase: 127 U/L — ABNORMAL HIGH (ref 38–126)
Anion gap: 7 (ref 5–15)
BUN: 12 mg/dL (ref 6–20)
CO2: 28 mmol/L (ref 22–32)
Calcium: 9.2 mg/dL (ref 8.9–10.3)
Chloride: 107 mmol/L (ref 98–111)
Creatinine: 0.7 mg/dL (ref 0.44–1.00)
GFR, Estimated: >60 mL/min (ref >60)
Glucose, Bld: 88 mg/dL (ref 70–99)
Potassium: 3.7 mmol/L (ref 3.5–5.1)
Sodium: 142 mmol/L (ref 135–145)
Total Bilirubin: 0.5 mg/dL (ref 0.3–1.2)
Total Protein: 6.9 g/dL (ref 6.5–8.1)

## 2022-06-02 MED ORDER — FILGRASTIM-SNDZ 300 MCG/0.5ML IJ SOSY
300.0000 ug | PREFILLED_SYRINGE | Freq: Once | INTRAMUSCULAR | Status: AC
  Administered 2022-06-02: 300 ug via SUBCUTANEOUS
  Filled 2022-06-02: qty 0.5

## 2022-06-02 NOTE — Telephone Encounter (Signed)
Contacted patient to scheduled appointments. Patient is aware of appointments that are scheduled.   

## 2022-06-02 NOTE — Telephone Encounter (Signed)
This nurse reached out to patient and made her aware that her WBC level is low from her labs. Advised that she will need to receive Zarxio injections for 3 consecutive days. Reviewed neutropenic precautions with patient.  Advised that scheduling will reach out to set up her appointments. Patient acknowledged understanding. No further questions or concerns at this time

## 2022-06-03 ENCOUNTER — Inpatient Hospital Stay: Payer: Commercial Managed Care - PPO

## 2022-06-03 VITALS — BP 109/68 | HR 94 | Temp 98.5°F | Resp 16

## 2022-06-03 DIAGNOSIS — D701 Agranulocytosis secondary to cancer chemotherapy: Secondary | ICD-10-CM

## 2022-06-03 DIAGNOSIS — Z5112 Encounter for antineoplastic immunotherapy: Secondary | ICD-10-CM | POA: Diagnosis not present

## 2022-06-03 MED ORDER — FILGRASTIM-SNDZ 300 MCG/0.5ML IJ SOSY
300.0000 ug | PREFILLED_SYRINGE | Freq: Once | INTRAMUSCULAR | Status: AC
  Administered 2022-06-03: 300 ug via SUBCUTANEOUS
  Filled 2022-06-03: qty 0.5

## 2022-06-04 ENCOUNTER — Other Ambulatory Visit: Payer: Self-pay

## 2022-06-04 ENCOUNTER — Inpatient Hospital Stay: Payer: Commercial Managed Care - PPO

## 2022-06-04 ENCOUNTER — Telehealth: Payer: Self-pay | Admitting: Medical Oncology

## 2022-06-04 VITALS — BP 95/58 | HR 95 | Temp 98.2°F | Resp 16

## 2022-06-04 DIAGNOSIS — T451X5A Adverse effect of antineoplastic and immunosuppressive drugs, initial encounter: Secondary | ICD-10-CM

## 2022-06-04 DIAGNOSIS — Z5112 Encounter for antineoplastic immunotherapy: Secondary | ICD-10-CM | POA: Diagnosis not present

## 2022-06-04 MED ORDER — FILGRASTIM-SNDZ 300 MCG/0.5ML IJ SOSY
300.0000 ug | PREFILLED_SYRINGE | Freq: Once | INTRAMUSCULAR | Status: AC
  Administered 2022-06-04: 300 ug via SUBCUTANEOUS
  Filled 2022-06-04: qty 0.5

## 2022-06-04 NOTE — Telephone Encounter (Signed)
I told pt that someone will let her know if she needs to move her scan to after  05/15. She will keep her weekly lab appt on 5/8

## 2022-06-07 ENCOUNTER — Other Ambulatory Visit: Payer: Self-pay

## 2022-06-07 ENCOUNTER — Telehealth: Payer: Self-pay | Admitting: Medical Oncology

## 2022-06-07 ENCOUNTER — Ambulatory Visit: Payer: Commercial Managed Care - PPO | Admitting: Internal Medicine

## 2022-06-07 ENCOUNTER — Ambulatory Visit: Payer: Commercial Managed Care - PPO

## 2022-06-07 ENCOUNTER — Other Ambulatory Visit: Payer: Commercial Managed Care - PPO

## 2022-06-07 NOTE — Telephone Encounter (Signed)
I told pt to keep her CT scan as scheduled and keep appt with [facility].

## 2022-06-08 ENCOUNTER — Other Ambulatory Visit: Payer: Self-pay | Admitting: Physician Assistant

## 2022-06-08 DIAGNOSIS — C3491 Malignant neoplasm of unspecified part of right bronchus or lung: Secondary | ICD-10-CM

## 2022-06-09 ENCOUNTER — Other Ambulatory Visit: Payer: Self-pay

## 2022-06-09 ENCOUNTER — Other Ambulatory Visit: Payer: Commercial Managed Care - PPO

## 2022-06-09 ENCOUNTER — Inpatient Hospital Stay: Payer: Commercial Managed Care - PPO

## 2022-06-09 DIAGNOSIS — C3491 Malignant neoplasm of unspecified part of right bronchus or lung: Secondary | ICD-10-CM

## 2022-06-09 DIAGNOSIS — Z5112 Encounter for antineoplastic immunotherapy: Secondary | ICD-10-CM | POA: Diagnosis not present

## 2022-06-09 LAB — CBC WITH DIFFERENTIAL (CANCER CENTER ONLY)
Abs Immature Granulocytes: 0.03 10*3/uL (ref 0.00–0.07)
Basophils Absolute: 0 10*3/uL (ref 0.0–0.1)
Basophils Relative: 1 %
Eosinophils Absolute: 0 10*3/uL (ref 0.0–0.5)
Eosinophils Relative: 1 %
HCT: 31.8 % — ABNORMAL LOW (ref 36.0–46.0)
Hemoglobin: 10.8 g/dL — ABNORMAL LOW (ref 12.0–15.0)
Immature Granulocytes: 1 %
Lymphocytes Relative: 28 %
Lymphs Abs: 0.8 10*3/uL (ref 0.7–4.0)
MCH: 34.4 pg — ABNORMAL HIGH (ref 26.0–34.0)
MCHC: 34 g/dL (ref 30.0–36.0)
MCV: 101.3 fL — ABNORMAL HIGH (ref 80.0–100.0)
Monocytes Absolute: 0.5 10*3/uL (ref 0.1–1.0)
Monocytes Relative: 17 %
Neutro Abs: 1.5 10*3/uL — ABNORMAL LOW (ref 1.7–7.7)
Neutrophils Relative %: 52 %
Platelet Count: 82 10*3/uL — ABNORMAL LOW (ref 150–400)
RBC: 3.14 MIL/uL — ABNORMAL LOW (ref 3.87–5.11)
RDW: 16.8 % — ABNORMAL HIGH (ref 11.5–15.5)
Smear Review: NORMAL
WBC Count: 2.8 10*3/uL — ABNORMAL LOW (ref 4.0–10.5)
nRBC: 0 % (ref 0.0–0.2)

## 2022-06-09 LAB — CMP (CANCER CENTER ONLY)
ALT: 54 U/L — ABNORMAL HIGH (ref 0–44)
AST: 36 U/L (ref 15–41)
Albumin: 4 g/dL (ref 3.5–5.0)
Alkaline Phosphatase: 137 U/L — ABNORMAL HIGH (ref 38–126)
Anion gap: 8 (ref 5–15)
BUN: 16 mg/dL (ref 6–20)
CO2: 28 mmol/L (ref 22–32)
Calcium: 9.2 mg/dL (ref 8.9–10.3)
Chloride: 106 mmol/L (ref 98–111)
Creatinine: 0.9 mg/dL (ref 0.44–1.00)
GFR, Estimated: >60 mL/min (ref >60)
Glucose, Bld: 125 mg/dL — ABNORMAL HIGH (ref 70–99)
Potassium: 4 mmol/L (ref 3.5–5.1)
Sodium: 142 mmol/L (ref 135–145)
Total Bilirubin: 0.4 mg/dL (ref 0.3–1.2)
Total Protein: 7.1 g/dL (ref 6.5–8.1)

## 2022-06-11 ENCOUNTER — Ambulatory Visit (HOSPITAL_COMMUNITY)
Admission: RE | Admit: 2022-06-11 | Discharge: 2022-06-11 | Disposition: A | Discharge status: Home / Self Care | Payer: Commercial Managed Care - PPO | Source: Ambulatory Visit | Attending: Internal Medicine | Admitting: Internal Medicine

## 2022-06-11 DIAGNOSIS — C3490 Malignant neoplasm of unspecified part of unspecified bronchus or lung: Secondary | ICD-10-CM | POA: Insufficient documentation

## 2022-06-11 MED ORDER — IOHEXOL 300 MG/ML  SOLN: 100.0000 mL | Freq: Once | INTRAMUSCULAR | Status: DC | PRN

## 2022-06-11 MED ORDER — IOHEXOL 300 MG/ML  SOLN
80.0000 mL | Freq: Once | INTRAMUSCULAR | Status: AC | PRN
  Administered 2022-06-11: 80 mL via INTRAVENOUS

## 2022-06-15 ENCOUNTER — Other Ambulatory Visit: Payer: Commercial Managed Care - PPO

## 2022-06-15 ENCOUNTER — Ambulatory Visit: Payer: Commercial Managed Care - PPO

## 2022-06-15 ENCOUNTER — Other Ambulatory Visit: Payer: Self-pay | Admitting: Physician Assistant

## 2022-06-15 ENCOUNTER — Ambulatory Visit: Payer: Commercial Managed Care - PPO | Admitting: Internal Medicine

## 2022-06-15 DIAGNOSIS — C3411 Malignant neoplasm of upper lobe, right bronchus or lung: Secondary | ICD-10-CM

## 2022-06-16 ENCOUNTER — Other Ambulatory Visit: Payer: Self-pay | Admitting: Medical Oncology

## 2022-06-16 ENCOUNTER — Inpatient Hospital Stay: Payer: Commercial Managed Care - PPO

## 2022-06-16 ENCOUNTER — Encounter: Payer: Self-pay | Admitting: Internal Medicine

## 2022-06-16 ENCOUNTER — Inpatient Hospital Stay (HOSPITAL_BASED_OUTPATIENT_CLINIC_OR_DEPARTMENT_OTHER): Payer: Commercial Managed Care - PPO | Admitting: Internal Medicine

## 2022-06-16 ENCOUNTER — Encounter: Payer: Self-pay | Admitting: Medical Oncology

## 2022-06-16 ENCOUNTER — Other Ambulatory Visit: Payer: Self-pay

## 2022-06-16 VITALS — BP 121/54 | HR 95 | Resp 17

## 2022-06-16 DIAGNOSIS — Z5112 Encounter for antineoplastic immunotherapy: Secondary | ICD-10-CM | POA: Diagnosis not present

## 2022-06-16 DIAGNOSIS — C3491 Malignant neoplasm of unspecified part of right bronchus or lung: Secondary | ICD-10-CM

## 2022-06-16 DIAGNOSIS — T451X5A Adverse effect of antineoplastic and immunosuppressive drugs, initial encounter: Secondary | ICD-10-CM

## 2022-06-16 DIAGNOSIS — I878 Other specified disorders of veins: Secondary | ICD-10-CM

## 2022-06-16 LAB — CBC WITH DIFFERENTIAL (CANCER CENTER ONLY)
Abs Immature Granulocytes: 0 10*3/uL (ref 0.00–0.07)
Basophils Absolute: 0 10*3/uL (ref 0.0–0.1)
Basophils Relative: 0 %
Eosinophils Absolute: 0 10*3/uL (ref 0.0–0.5)
Eosinophils Relative: 1 %
HCT: 30 % — ABNORMAL LOW (ref 36.0–46.0)
Hemoglobin: 10.4 g/dL — ABNORMAL LOW (ref 12.0–15.0)
Immature Granulocytes: 0 %
Lymphocytes Relative: 17 %
Lymphs Abs: 0.6 10*3/uL — ABNORMAL LOW (ref 0.7–4.0)
MCH: 35.1 pg — ABNORMAL HIGH (ref 26.0–34.0)
MCHC: 34.7 g/dL (ref 30.0–36.0)
MCV: 101.4 fL — ABNORMAL HIGH (ref 80.0–100.0)
Monocytes Absolute: 0.3 10*3/uL (ref 0.1–1.0)
Monocytes Relative: 8 %
Neutro Abs: 2.4 10*3/uL (ref 1.7–7.7)
Neutrophils Relative %: 74 %
Platelet Count: 109 10*3/uL — ABNORMAL LOW (ref 150–400)
RBC: 2.96 MIL/uL — ABNORMAL LOW (ref 3.87–5.11)
RDW: 17.2 % — ABNORMAL HIGH (ref 11.5–15.5)
WBC Count: 3.2 10*3/uL — ABNORMAL LOW (ref 4.0–10.5)
nRBC: 0 % (ref 0.0–0.2)

## 2022-06-16 LAB — CMP (CANCER CENTER ONLY)
ALT: 56 U/L — ABNORMAL HIGH (ref 0–44)
AST: 36 U/L (ref 15–41)
Albumin: 4 g/dL (ref 3.5–5.0)
Alkaline Phosphatase: 107 U/L (ref 38–126)
Anion gap: 8 (ref 5–15)
BUN: 14 mg/dL (ref 6–20)
CO2: 26 mmol/L (ref 22–32)
Calcium: 9.3 mg/dL (ref 8.9–10.3)
Chloride: 107 mmol/L (ref 98–111)
Creatinine: 0.84 mg/dL (ref 0.44–1.00)
GFR, Estimated: >60 mL/min (ref >60)
Glucose, Bld: 97 mg/dL (ref 70–99)
Potassium: 3.6 mmol/L (ref 3.5–5.1)
Sodium: 141 mmol/L (ref 135–145)
Total Bilirubin: 0.5 mg/dL (ref 0.3–1.2)
Total Protein: 7 g/dL (ref 6.5–8.1)

## 2022-06-16 MED ORDER — SODIUM CHLORIDE 0.9 % IV SOLN
302.8000 mg | Freq: Once | INTRAVENOUS | Status: AC
  Administered 2022-06-16: 300 mg via INTRAVENOUS
  Filled 2022-06-16: qty 30

## 2022-06-16 MED ORDER — SODIUM CHLORIDE 0.9 % IV SOLN
400.0000 mg/m2 | Freq: Once | INTRAVENOUS | Status: AC
  Administered 2022-06-16: 600 mg via INTRAVENOUS
  Filled 2022-06-16: qty 24

## 2022-06-16 MED ORDER — SODIUM CHLORIDE 0.9 % IV SOLN: Freq: Once | INTRAVENOUS | Status: DC | PRN

## 2022-06-16 MED ORDER — FAMOTIDINE 20 MG IN NS 100 ML IVPB
20.0000 mg | Freq: Once | INTRAVENOUS | Status: AC | PRN
  Administered 2022-06-16: 20 mg via INTRAVENOUS

## 2022-06-16 MED ORDER — ONDANSETRON 8 MG PO TBDP
8.0000 mg | ORAL_TABLET | Freq: Three times a day (TID) | ORAL | 1 refills | Status: DC | PRN
Start: 2022-06-16 — End: 2023-01-19

## 2022-06-16 MED ORDER — METHYLPREDNISOLONE SODIUM SUCC 125 MG IJ SOLR
125.0000 mg | Freq: Once | INTRAMUSCULAR | Status: AC | PRN
  Administered 2022-06-16: 125 mg via INTRAVENOUS

## 2022-06-16 MED ORDER — ONDANSETRON 8 MG PO TBDP
8.0000 mg | ORAL_TABLET | Freq: Three times a day (TID) | ORAL | 1 refills | Status: DC | PRN
Start: 2022-06-16 — End: 2022-06-16

## 2022-06-16 MED ORDER — SODIUM CHLORIDE 0.9 % IV SOLN: Freq: Once | INTRAVENOUS | Status: AC

## 2022-06-16 MED ORDER — SODIUM CHLORIDE 0.9 % IV SOLN
10.0000 mg | Freq: Once | INTRAVENOUS | Status: AC
  Administered 2022-06-16: 10 mg via INTRAVENOUS
  Filled 2022-06-16: qty 10

## 2022-06-16 MED ORDER — SODIUM CHLORIDE 0.9 % IV SOLN
200.0000 mg | Freq: Once | INTRAVENOUS | Status: AC
  Administered 2022-06-16: 200 mg via INTRAVENOUS
  Filled 2022-06-16: qty 200

## 2022-06-16 MED ORDER — SODIUM CHLORIDE 0.9 % IV SOLN
150.0000 mg | Freq: Once | INTRAVENOUS | Status: AC
  Administered 2022-06-16: 150 mg via INTRAVENOUS
  Filled 2022-06-16: qty 150

## 2022-06-16 MED ORDER — PALONOSETRON HCL INJECTION 0.25 MG/5ML
0.2500 mg | Freq: Once | INTRAVENOUS | Status: AC
  Administered 2022-06-16: 0.25 mg via INTRAVENOUS
  Filled 2022-06-16: qty 5

## 2022-06-16 NOTE — Telephone Encounter (Signed)
Med refill requested for ZOFRAN ODT.

## 2022-06-16 NOTE — Telephone Encounter (Signed)
Cancelled zofran rx at [facility] and sent it to [facility].

## 2022-06-16 NOTE — Progress Notes (Signed)
Pt requests port a cath -order entered.

## 2022-06-16 NOTE — Addendum Note (Signed)
Addended by: Charma Igo on: 06/16/2022 11:41 AM   Modules accepted: Orders

## 2022-06-16 NOTE — Progress Notes (Signed)
Patient seen by Dr. Mohamed  Vitals are within treatment parameters.  Labs reviewed: and are within treatment parameters.  Per physician team, patient is ready for treatment and there are NO modifications to the treatment plan.  

## 2022-06-16 NOTE — Progress Notes (Signed)
[facility] [phone]   [phone]  OFFICE PROGRESS NOTE  Julie Panda, NP [address] [place] [address]  DIAGNOSIS: Recurrent/metastatic non-small cell lung cancer initially diagnosed as stage IIIB  (T1b, N3, M0) non-small cell lung cancer, adenocarcinoma she presented with right upper lobe nodule in addition to bulky right hilar, mediastinal, and left supraclavicular lymphadenopathy diagnosed in November 2022.  The patient had evidence for disease recurrence in the mediastinal and right supraclavicular lymphadenopathy in August 2023.  The patient had evidence of metastatic disease in February 2024 with several supraclavicular, thoracic and precarinal lymphadenopathy as well as upper abdominal lymph nodes and small liver lesion.  DETECTED ALTERATION(S) / BIOMARKER(S) % CFDNA OR AMPLIFICATION ASSOCIATED FDA-APPROVED THERAPIES CLINICAL TRIAL AVAILABILITY [id number] ND 0.5 5 50 100 4.7%  RHOAG17E ND 0.5 5 50 100 1.8%  [id number] ND 0.5 5 50 100 1.9%  BIOMARKER ADDITIONAL DETAILS Tumor Mutational Burden (TMB) 19.02 mut/Mb MSI Status Stable (MSS) PD-L1 Tumor Proportion Score (TPS)* <1%  Biomarker Findings By [facility] on 05/06/2022 Tumor Mutational Burden - 17 Muts/Mb Microsatellite status - MS-Stable Genomic Findings For a complete list of the genes assayed, please refer to the Appendix. CTNNB1 S37C CRKL amplification MAP2K4 loss exons 2-11 TP53 V143A 8 Disease relevant genes with no reportable alterations: ALK, BRAF, EGFR, ERBB2, KRAS, MET, RET, ROS1  PDL1 0%   PRIOR THERAPY:  1) Concurrent chemoradiation with carboplatin for an AUC of 2 and paclitaxel 45 mg per metered squared.  First dose on 01/05/2021.  Status post 7 cycles of treatment.  Last dose was given February 16, 2021. 2) Consolidation immunotherapy with Imfinzi 1500 Mg IV every 4 weeks.  First dose March 25, 2021.  Status post 3 cycles.  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 3) Palliative radiotherapy to the enlarging right supraclavicular lymphadenopathy under the care of Dr. Roselind Messier expected to be completed on October 28, 2021.   CURRENT THERAPY: Palliative systemic chemotherapy with carboplatin for AUC of 5, Alimta 500 Mg/M2 and Keytruda 200 Mg IV every 3 weeks.  First dose April 05, 2022.  Status post 3 cycles.  Starting from cycle #3 her dose of carboplatin was reduced to AUC of 4 and Alimta 400 Mg/M2  INTERVAL HISTORY: Julie Nicholson 57 y.o. female returns to the clinic today for follow-up visit accompanied by her daughter Julie Nicholson.  The patient is feeling fine today with no concerning complaints.  She was treated recently for tick bite with doxycycline.  She denied having any current chest pain, shortness of breath, cough or hemoptysis.  She has no nausea, vomiting, diarrhea or constipation.  She has no headache or visual changes.  She denied having any fever or chills.  She has been tolerating her treatment with systemic chemotherapy fairly well except for fatigue.  The patient had repeat CT scan of the neck, chest, abdomen and pelvis performed recently and she is here for evaluation and discussion of her scan results.    MEDICAL HISTORY: Past Medical History:  Diagnosis Date   Abdominal discomfort    Cancer (HCC)    Chronic headaches    due to allergies, sinus   COPD (chronic obstructive pulmonary disease) (HCC)    per 2012 chest xray   pt states she doesn not have this now (04/10/2013)   Deviated nasal septum    Eustachian tube dysfunction    GERD (gastroesophageal reflux disease)    occasional uses Tums / Rolaids  Hearing loss    right ear   High cholesterol    History of radiation therapy    right lung 01/07/2021-02/19/2021  Dr Antony Blackbird   Migraine    "only once in a blue moon since RX'd allergy shots" (04/10/2013)   Pancreatitis 02/08/2013   Pneumonia    Rhinitis,  allergic     ALLERGIES:  has no active allergies.  MEDICATIONS:  Current Outpatient Medications  Medication Sig Dispense Refill   acetaminophen (TYLENOL) 500 MG tablet Take 1,000 mg by mouth every 6 (six) hours as needed for moderate pain.     albuterol (PROVENTIL) (2.5 MG/3ML) 0.083% nebulizer solution albuterol sulfate 2.5 mg/3 mL (0.083 %) solution for nebulization  USE 1 VIAL IN NEBULIZER EVERY 6 HOURS AS NEEDED FOR WHEEZING OR SHORTNESS OF BREATH     aspirin EC 81 MG tablet Take 81 mg by mouth at bedtime.     atorvastatin (LIPITOR) 40 MG tablet Take 40 mg by mouth at bedtime.     benzonatate (TESSALON) 200 MG capsule Take 200 mg by mouth 3 (three) times daily as needed.     Biotin 5000 MCG TABS Take 5,000 mcg by mouth at bedtime.     buPROPion (WELLBUTRIN XL) 150 MG 24 hr tablet Take 150 mg by mouth every morning.     Ca Carbonate-Mag Hydroxide (ROLAIDS PO) Take 1 tablet by mouth daily as needed (heartburn). (Patient not taking: Reported on 05/21/2022)     famotidine (PEPCID) 40 MG tablet Take 40 mg by mouth daily.     fluticasone (FLONASE) 50 MCG/ACT nasal spray Place 2 sprays into both nostrils daily as needed for allergies.     folic acid (FOLVITE) 1 MG tablet Take 1 tablet by mouth once daily 30 tablet 0   hydrocortisone 1 % ointment Apply 1 Application topically 2 (two) times daily. 30 g 0   linaCLOtide (LINZESS PO) Take by mouth. (Patient not taking: Reported on 05/21/2022)     loratadine (CLARITIN) 10 MG tablet TAKE 1 TABLET BY MOUTH EVERY DAY 90 tablet 1   magic mouthwash SOLN Take 5 mLs by mouth 4 (four) times daily as needed for mouth pain. 240 mL 0   Multiple Vitamins-Minerals (HAIR SKIN AND NAILS FORMULA PO) Take 1 tablet by mouth daily.     ondansetron (ZOFRAN-ODT) 8 MG disintegrating tablet Take 8 mg by mouth 3 (three) times daily as needed.     Oxycodone HCl 10 MG TABS Take 1 tablet by mouth 3 (three) times daily as needed (pain).     prochlorperazine (COMPAZINE) 10 MG  tablet Take 1 tablet (10 mg total) by mouth every 6 (six) hours as needed. 30 tablet 2   rizatriptan (MAXALT-MLT) 10 MG disintegrating tablet Take 10 mg by mouth as needed for migraine. May repeat in 2 hours if needed     sertraline (ZOLOFT) 50 MG tablet Take 1 tablet by mouth daily.     Tiotropium Bromide-Olodaterol (STIOLTO RESPIMAT) 2.5-2.5 MCG/ACT AERS Inhale 2 puffs into the lungs daily. 4 g 3   No current facility-administered medications for this visit.    SURGICAL HISTORY:  Past Surgical History:  Procedure Laterality Date   ABDOMINAL HYSTERECTOMY  1995   tx endometriosis, both ovaries removed   APPENDECTOMY  late 1990's   BIOPSY  02/26/2021   Procedure: BIOPSY;  Surgeon: Rachael Fee, MD;  Location: [facility];  Service: Endoscopy;;   BRONCHIAL BRUSHINGS  12/15/2020   Procedure: BRONCHIAL BRUSHINGS;  Surgeon: Leslye Peer,  MD;  Location: [facility];  Service: Cardiopulmonary;;   BRONCHIAL NEEDLE ASPIRATION BIOPSY  12/15/2020   Procedure: BRONCHIAL NEEDLE ASPIRATION BIOPSIES;  Surgeon: Leslye Peer, MD;  Location: [facility] END[facility];  Service: Cardiopulmonary;;   CHOLECYSTECTOMY  04/10/2013   CHOLECYSTECTOMY N/A 04/10/2013   Procedure: LAPAROSCOPIC CHOLECYSTECTOMY WITH INTRAOPERATIVE CHOLANGIOGRAM;  Surgeon: Adolph Pollack, MD;  Location: [facility] OR;  Service: General;  Laterality: N/A;   ELECTROMAGNETIC NAVIGATION BROCHOSCOPY  12/15/2020   Procedure: ELECTROMAGNETIC NAVIGATION BRONCHOSCOPY;  Surgeon: Leslye Peer, MD;  Location: [facility] ENDOSCOPY;  Service: Cardiopulmonary;;   ESOPHAGOGASTRODUODENOSCOPY (EGD) WITH PROPOFOL N/A 02/26/2021   Procedure: ESOPHAGOGASTRODUODENOSCOPY (EGD) WITH PROPOFOL;  Surgeon: Rachael Fee, MD;  Location: [facility];  Service: Endoscopy;  Laterality: N/A;   EUS N/A 02/16/2013   Procedure: UPPER ENDOSCOPIC ULTRASOUND (EUS) LINEAR;  Surgeon: Rachael Fee, MD;  Location: [facility];  Service: Endoscopy;  Laterality: N/A;  radial linear    KNEE ARTHROSCOPY Right 1980's   "cartilage OR"   LAPAROSCOPIC ENDOMETRIOSIS FULGURATION  1980's   MYRINGOTOMY WITH TUBE PLACEMENT Right 07/13/2018   Procedure: MYRINGOTOMY WITH TUBE PLACEMENT;  Surgeon: Vernie Murders, MD;  Location: [facility];  Service: ENT;  Laterality: Right;   MYRINGOTOMY WITH TUBE PLACEMENT Right 06/04/2021   Procedure: MYRINGOTOMY WITH BUTTERFLY TUBE PLACEMENT;  Surgeon: Vernie Murders, MD;  Location: [facility];  Service: ENT;  Laterality: Right;   NASOPHARYNGOSCOPY EUSTATION TUBE BALLOON DILATION Right 07/13/2018   Procedure: NASOPHARYNGOSCOPY EUSTATION TUBE BALLOON DILATION;  Surgeon: Vernie Murders, MD;  Location: [facility];  Service: ENT;  Laterality: Right;   TONSILLECTOMY AND ADENOIDECTOMY  ~ 1980   adenoidectomy   TUBAL LIGATION  ~ 1987   TURBINATE REDUCTION Right 07/13/2018   Procedure: OUTFRACTURE TURBINATE;  Surgeon: Vernie Murders, MD;  Location: [facility];  Service: ENT;  Laterality: Right;   VIDEO BRONCHOSCOPY WITH ENDOBRONCHIAL ULTRASOUND N/A 12/15/2020   Procedure: ROBOTIC VIDEO BRONCHOSCOPY WITH ENDOBRONCHIAL ULTRASOUND;  Surgeon: Leslye Peer, MD;  Location: [facility];  Service: Cardiopulmonary;  Laterality: N/A;   WRIST SURGERY Left    w/plate    REVIEW OF SYSTEMS:  Constitutional: positive for fatigue Eyes: negative Ears, nose, mouth, throat, and face: negative Respiratory: negative Cardiovascular: negative Gastrointestinal: negative Genitourinary:negative Integument/breast: negative Hematologic/lymphatic: negative Musculoskeletal:negative Neurological: negative Behavioral/Psych: negative Endocrine: negative Allergic/Immunologic: negative   PHYSICAL EXAMINATION: General appearance: alert, cooperative, fatigued, and no distress Head: Normocephalic, without obvious abnormality, atraumatic Neck: moderate anterior cervical adenopathy, no JVD, supple, symmetrical, trachea midline, and thyroid not  enlarged, symmetric, no tenderness/mass/nodules Lymph nodes: Cervical, supraclavicular, and axillary nodes normal. Resp: clear to auscultation bilaterally Back: symmetric, no curvature. ROM normal. No CVA tenderness. Cardio: regular rate and rhythm, S1, S2 normal, no murmur, click, rub or gallop GI: soft, non-tender; bowel sounds normal; no masses,  no organomegaly Extremities: extremities normal, atraumatic, no cyanosis or edema Neurologic: Alert and oriented X 3, normal strength and tone. Normal symmetric reflexes. Normal coordination and gait  ECOG PERFORMANCE STATUS: 1 - Symptomatic but completely ambulatory  Blood pressure 115/71, pulse 100, temperature 97.8 F (36.6 C), temperature source Oral, resp. rate 16, weight 108 lb 1.6 oz (49 kg), SpO2 100 %.  LABORATORY DATA: Lab Results  Component Value Date   WBC 3.2 (L) 06/16/2022   HGB 10.4 (L) 06/16/2022   HCT 30.0 (L) 06/16/2022   MCV 101.4 (H) 06/16/2022   PLT 109 (L) 06/16/2022      Chemistry      Component Value Date/Time  NA 142 06/09/2022 0830   K 4.0 06/09/2022 0830   CL 106 06/09/2022 0830   CO2 28 06/09/2022 0830   BUN 16 06/09/2022 0830   CREATININE 0.90 06/09/2022 0830      Component Value Date/Time   CALCIUM 9.2 06/09/2022 0830   ALKPHOS 137 (H) 06/09/2022 0830   AST 36 06/09/2022 0830   ALT 54 (H) 06/09/2022 0830   BILITOT 0.4 06/09/2022 0830       RADIOGRAPHIC STUDIES: CT Chest W Contrast  Result Date: 06/14/2022 CLINICAL DATA:  Staging non-small-cell lung cancer. Original stage T1B, N3, M0. Right upper lobe adenocarcinoma. * Tracking Code: BO * EXAM: CT CHEST, ABDOMEN, AND PELVIS WITH CONTRAST TECHNIQUE: Multidetector CT imaging of the chest, abdomen and pelvis was performed following the standard protocol during bolus administration of intravenous contrast. RADIATION DOSE REDUCTION: This exam was performed according to the departmental dose-optimization program which includes automated exposure  control, adjustment of the mA and/or kV according to patient size and/or use of iterative reconstruction technique. CONTRAST:  80mL OMNIPAQUE IOHEXOL 300 MG/ML SOLN, <See Chart> OMNIPAQUE IOHEXOL 300 MG/ML SOLN COMPARISON:  PET-CT 03/26/2022. Abdomen pelvis CT 04/10/2022. Chest CT 03/16/2022. Older exams as well. FINDINGS: CT CHEST FINDINGS Cardiovascular: The thoracic aorta has a normal course and caliber with mild atherosclerotic plaque and intimal thickening. Coronary artery calcifications are seen. Please correlate for other coronary risk factors. Heart is nonenlarged. No significant pericardial effusion. Mediastinum/Nodes: Small thyroid gland. Patulous esophagus with mild wall thickening along the midthoracic esophagus, similar to previous. Mild mediastinal fat stranding. Several abnormal lymph nodes were seen on the prior examination. Specific lesions will be followed for continuity. Left supraclavicular node today series 3, image 15 measures 9 by 6 mm. On previous study from February 2024 this measured 15 by 9 mm. Right-sided supraclavicular lymph node which measured 11 by 8 mm on the prior, today on series 3, image 21 measures 8 by 4 mm. Precarinal node which previously measured 20 x 11 mm, today on series 3, image 54 measures 17 by 8 mm. Other mediastinal nodes are smaller. There is some ill-defined soft tissue thickening throughout the mediastinum. No clear abnormal lymph node enlargement present in the axillary regions or hilum. Lungs/Pleura: Centrilobular emphysematous lung changes are identified diffusely. No pleural effusion or pneumothorax. There is some linear opacity at the left lung base likely scar or atelectasis. As on the prior there are bandlike opacities involving the right upper lobe with scarring, fibrosis and retraction/volume loss. This extends back to the hilum with some distortion of bronchi and lung tissue. Appearance is similar to the prior examinations. On the PET-CT there is a small  area of enhancement centered in the soft tissue thickening in the right hilum. This is difficult to separate from the adjacent tissue today for measurement but the amount of tissue in this location appears to be decreasing from the prior CT scan. The hilar node is not measurable today in this location. Musculoskeletal: No chest wall mass or suspicious bone lesions identified. CT ABDOMEN PELVIS FINDINGS Hepatobiliary: Diffuse fatty liver infiltration. Previous cholecystectomy. Patent portal vein. The central lesion in the right hepatic lobe that measured 15 x 14 mm on the previous examination, today is faintly visible on series 3, image 144 using general anatomy and small amount of residual heterogeneity measures 7 mm on series 3, image 144. This would be very difficult to see without the prior. No new space-occupying liver lesion. Pancreas: Stable mild dilatation of the pancreatic duct without  pancreatic atrophy. Partial divisum. No pancreatic mass. Spleen: Normal in size without focal abnormality. Adrenals/Urinary Tract: Stable adrenal glands. No enhancing renal mass or collecting system dilatation. There are some small low-attenuation lesions along each kidney, too small to completely characterize but likely small cysts. Bosniak 2 lesions. Exophytic lesion from the anterior aspect of the right kidney on series 13, image 15 measures 10 mm. Hounsfield unit of 46. On delayed Hounsfield unit of fifty-five, not significantly changed when adjusting for technique between the 2 datasets. This is hyperdense on the PET-CT, attenuation correction CT dataset with Hounsfield units of 59. Based on this would favor a proteinaceous or hemorrhagic lesion, Bosniak 2. No specific imaging follow-up. Preserved contours of the urinary bladder. Stomach/Bowel: Large bowel has a normal course and caliber. Few colonic diverticula. There are some subtle areas of wall thickening but the colon is decompressed in these locations and there is  no inflammatory stranding. Surgical changes along the base of the cecum in the appendix is not seen. Moderate debris in the stomach. Vascular/Lymphatic: Scattered vascular calcifications. Normal caliber aorta and IVC the previous abnormal lymph node near the porta hepatis and adjacent to the celiac axis appears smaller today. Example which previously measured 17 by 22 mm on the prior, today on series 3, image 146 measures 10 by 9 mm. Again other nodes in this location are smaller including nodes in the portacaval space. No new lymph node enlargement. Reproductive: Status post hysterectomy. No adnexal masses. Other: No abdominal wall hernia or abnormality. No abdominopelvic ascites. Musculoskeletal: Scattered degenerative changes of the spine and pelvis. IMPRESSION: Overall improvement in areas of previous disease. Decreasing mediastinal, supraclavicular, upper abdominal and right hilar lymph nodes. Decreasing right hepatic lobe liver lesion. No new liver lesions identified. Stable posttreatment changes involving the right lung. No new lesion identified. Fatty liver infiltration. Colonic diverticula. Benign appearing renal cystic lesions. Electronically Signed   By: Karen Kays M.D.   On: 06/14/2022 15:09   CT Abdomen Pelvis W Contrast  Result Date: 06/14/2022 CLINICAL DATA:  Staging non-small-cell lung cancer. Original stage T1B, N3, M0. Right upper lobe adenocarcinoma. * Tracking Code: BO * EXAM: CT CHEST, ABDOMEN, AND PELVIS WITH CONTRAST TECHNIQUE: Multidetector CT imaging of the chest, abdomen and pelvis was performed following the standard protocol during bolus administration of intravenous contrast. RADIATION DOSE REDUCTION: This exam was performed according to the departmental dose-optimization program which includes automated exposure control, adjustment of the mA and/or kV according to patient size and/or use of iterative reconstruction technique. CONTRAST:  80mL OMNIPAQUE IOHEXOL 300 MG/ML SOLN, <See  Chart> OMNIPAQUE IOHEXOL 300 MG/ML SOLN COMPARISON:  PET-CT 03/26/2022. Abdomen pelvis CT 04/10/2022. Chest CT 03/16/2022. Older exams as well. FINDINGS: CT CHEST FINDINGS Cardiovascular: The thoracic aorta has a normal course and caliber with mild atherosclerotic plaque and intimal thickening. Coronary artery calcifications are seen. Please correlate for other coronary risk factors. Heart is nonenlarged. No significant pericardial effusion. Mediastinum/Nodes: Small thyroid gland. Patulous esophagus with mild wall thickening along the midthoracic esophagus, similar to previous. Mild mediastinal fat stranding. Several abnormal lymph nodes were seen on the prior examination. Specific lesions will be followed for continuity. Left supraclavicular node today series 3, image 15 measures 9 by 6 mm. On previous study from February 2024 this measured 15 by 9 mm. Right-sided supraclavicular lymph node which measured 11 by 8 mm on the prior, today on series 3, image 21 measures 8 by 4 mm. Precarinal node which previously measured 20 x 11 mm, today  on series 3, image 54 measures 17 by 8 mm. Other mediastinal nodes are smaller. There is some ill-defined soft tissue thickening throughout the mediastinum. No clear abnormal lymph node enlargement present in the axillary regions or hilum. Lungs/Pleura: Centrilobular emphysematous lung changes are identified diffusely. No pleural effusion or pneumothorax. There is some linear opacity at the left lung base likely scar or atelectasis. As on the prior there are bandlike opacities involving the right upper lobe with scarring, fibrosis and retraction/volume loss. This extends back to the hilum with some distortion of bronchi and lung tissue. Appearance is similar to the prior examinations. On the PET-CT there is a small area of enhancement centered in the soft tissue thickening in the right hilum. This is difficult to separate from the adjacent tissue today for measurement but the amount  of tissue in this location appears to be decreasing from the prior CT scan. The hilar node is not measurable today in this location. Musculoskeletal: No chest wall mass or suspicious bone lesions identified. CT ABDOMEN PELVIS FINDINGS Hepatobiliary: Diffuse fatty liver infiltration. Previous cholecystectomy. Patent portal vein. The central lesion in the right hepatic lobe that measured 15 x 14 mm on the previous examination, today is faintly visible on series 3, image 144 using general anatomy and small amount of residual heterogeneity measures 7 mm on series 3, image 144. This would be very difficult to see without the prior. No new space-occupying liver lesion. Pancreas: Stable mild dilatation of the pancreatic duct without pancreatic atrophy. Partial divisum. No pancreatic mass. Spleen: Normal in size without focal abnormality. Adrenals/Urinary Tract: Stable adrenal glands. No enhancing renal mass or collecting system dilatation. There are some small low-attenuation lesions along each kidney, too small to completely characterize but likely small cysts. Bosniak 2 lesions. Exophytic lesion from the anterior aspect of the right kidney on series 13, image 15 measures 10 mm. Hounsfield unit of 46. On delayed Hounsfield unit of fifty-five, not significantly changed when adjusting for technique between the 2 datasets. This is hyperdense on the PET-CT, attenuation correction CT dataset with Hounsfield units of 59. Based on this would favor a proteinaceous or hemorrhagic lesion, Bosniak 2. No specific imaging follow-up. Preserved contours of the urinary bladder. Stomach/Bowel: Large bowel has a normal course and caliber. Few colonic diverticula. There are some subtle areas of wall thickening but the colon is decompressed in these locations and there is no inflammatory stranding. Surgical changes along the base of the cecum in the appendix is not seen. Moderate debris in the stomach. Vascular/Lymphatic: Scattered vascular  calcifications. Normal caliber aorta and IVC the previous abnormal lymph node near the porta hepatis and adjacent to the celiac axis appears smaller today. Example which previously measured 17 by 22 mm on the prior, today on series 3, image 146 measures 10 by 9 mm. Again other nodes in this location are smaller including nodes in the portacaval space. No new lymph node enlargement. Reproductive: Status post hysterectomy. No adnexal masses. Other: No abdominal wall hernia or abnormality. No abdominopelvic ascites. Musculoskeletal: Scattered degenerative changes of the spine and pelvis. IMPRESSION: Overall improvement in areas of previous disease. Decreasing mediastinal, supraclavicular, upper abdominal and right hilar lymph nodes. Decreasing right hepatic lobe liver lesion. No new liver lesions identified. Stable posttreatment changes involving the right lung. No new lesion identified. Fatty liver infiltration. Colonic diverticula. Benign appearing renal cystic lesions. Electronically Signed   By: Karen Kays M.D.   On: 06/14/2022 15:09   CT Soft Tissue Neck W  Contrast  Result Date: 06/14/2022 CLINICAL DATA:  Metastatic non-small cell lung cancer EXAM: CT NECK WITH CONTRAST TECHNIQUE: Multidetector CT imaging of the neck was performed using the standard protocol following the bolus administration of intravenous contrast. RADIATION DOSE REDUCTION: This exam was performed according to the departmental dose-optimization program which includes automated exposure control, adjustment of the mA and/or kV according to patient size and/or use of iterative reconstruction technique. CONTRAST:  80mL OMNIPAQUE IOHEXOL 300 MG/ML SOLN, <See Chart> OMNIPAQUE IOHEXOL 300 MG/ML SOLN COMPARISON:  03/16/2022 and 01/04/2022 CT neck FINDINGS: Pharynx and larynx: Normal. No mass or swelling. Salivary glands: No inflammation, mass, or stone. Thyroid: Normal. Lymph nodes: Redemonstrated enlarged left level 4 lymph node, which measures  up to 8 mm in short axis (series 3, image 79), likely unchanged from the prior exam, which is somewhat limited by motion artifact, but stable to slightly decreased in size compared to 01/04/2022, where it measured up to 9 mm in short axis. Left level 5B lymph node measures up to 7 mm in short axis (series 3, image 74), although it is unclear if this the same lymph node that previously measured up to 10 mm in short axis; this lymph node does not appear enlarged on the December 23 exam. Right level 4 lymph node measures up to 6 mm in short axis (series 3, image 77), likely unchanged given motion on the prior exam and possibly slightly decreased compared to 01/04/2022. Vascular: Patent.  Atherosclerotic calcifications. Limited intracranial: Negative. Visualized orbits: Negative. Mastoids and visualized paranasal sinuses: Mucosal thickening in the ethmoid air cells and maxillary sinuses. Skeleton: No acute osseous abnormality. Degenerative changes in the cervical spine. Upper chest: Please see same-day CT chest Other: None. IMPRESSION: 1. Bilateral level 4 lymph nodes, which appear stable to slightly decreased compared to the prior exam. 2. Left level 5B lymph node measures up to 7 mm in short axis, although it is unclear if this the same lymph node that previously measured up to 10 mm in short axis; this lymph node does not appear enlarged on the December 2023 exam. Attention on follow-up. 3. No other evidence of metastatic disease in the neck. 4. For findings in the thorax, please see same day CT chest. Electronically Signed   By: Wiliam Ke M.D.   On: 06/14/2022 15:00    ASSESSMENT AND PLAN: This is a very pleasant 57 years old white female with stage IIIB (T1b, N3, M0) non-small cell lung cancer, adenocarcinoma diagnosed in November 2022 with no actionable mutation and negative PD-L1 expression. The patient completed a course of concurrent chemoradiation with weekly carboplatin for AUC of 2 and paclitaxel 45  Mg/M2 status post 7 cycles.  She has been tolerating her treatment well except for the mild odynophagia and skin burns. She was also recently admitted to the hospital complaining of dysphagia and odynophagia secondary to radiation induced esophagitis.  She is feeling much better but continues to have residual dysphagia.  She is followed by gastroenterology and was seen by Dr. Christella Hartigan during her hospitalization. Her scan showed improvement of her disease. I recommended for the patient treatment with consolidation immunotherapy with Imfinzi 1500 Mg IV every 4 weeks.  Status post 3 cycles.  Last dose was given in April 2023.  Her treatment was discontinued secondary to suspicious immunotherapy mediated pneumonitis with significant shortness of breath at that time and she was treated with a tapered dose of prednisone. Unfortunately her scan showed evidence for disease recurrence with enlargement of lower  right cervical lymph nodes as well as mediastinal lymphadenopathy.  She has palpable right cervical lymphadenopathy. 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 The patient underwent ultrasound-guided core biopsy of the right supraclavicular lymph nodes but the final pathology showed necrotic tumor cells with complete coagulative necrosis with focal fibrous tissue and histiocytic reaction.  She also had MRI of the brain that showed no evidence of metastatic disease to the brain. The patient was seen by Dr. Roselind Messier and started palliative radiotherapy to the enlarging right supraclavicular lymphadenopathy.  This was completed on October 28, 2021. The patient was found to have metastatic  disease in February 2024 with several lymphadenopathy in the chest as well as supraclavicular, upper abdomen as well as suspicious small liver metastasis. 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  I will send the tissue biopsy to [facility] for molecular studies. She is currently undergoing systemic chemotherapy with carboplatin for AUC of 5, Alimta 500 Mg/M2 and Keytruda 200 Mg IV every 3 weeks status post 3 cycles.  Starting from cycle #3, I reduced her dose of carboplatin to AUC of 4 and Alimta 400 Mg/M2.  The patient tolerated the last cycle of her treatment fairly well. She had repeat CT scan of the neck, chest, abdomen and pelvis performed recently.  I personally and independently reviewed the scan and discussed the result with the patient and her daughter. Her scan showed improvement of her disease with decrease in the mediastinal, supraclavicular, upper abdominal and right hilar lymph nodes as well as decrease in the right hepatic lobe lesion. I recommended for the patient to continue her current treatment with the same regimen and she will proceed with cycle #4 today. Starting from cycle #5 she will be on maintenance Alimta and Keytruda every 3 weeks. The patient was advised to call immediately if she has any other concerning symptoms in the interval. The patient voices understanding of current disease status and treatment options and is in agreement with the current care plan.  All questions were answered. The patient knows to call the clinic with any problems, questions or concerns. We can certainly see the patient much sooner if necessary.  The total time spent in the appointment was 30 minutes.  Disclaimer: This note was dictated with voice recognition software. Similar sounding words can inadvertently be  transcribed and may not be corrected upon review.

## 2022-06-16 NOTE — Progress Notes (Signed)
Hypersensitivity Reaction note  Date of event: 06/16/22 Time of event: 11:40 Generic name of drug involved: Carbo Name of provider notified of the hypersensitivity reaction: Dr. Arbutus Ped Was agent that likely caused hypersensitivity reaction added to Allergies List within EMR? yes Chain of events including reaction signs/symptoms, treatment administered, and outcome (e.g., drug resumed; drug discontinued; sent to Emergency Department; etc.) Once Carboplatin was completed and flushed the patient stated that she felt like there was something caught in her throat. She did not feel this was unusual d/t scar tissue from radiation. Patient then developed itchy hands, hands were swollen. AT 11:43 IVF were hung to gravity and solumedrol 125mg  was administered. Pepcid IV was administered at 11:48. See flowsheet for VSS. Patient reported that the symptoms were resolving and se was starting to feel better. At 12:06 patient stated that she felt much better and was ready to leave. VSS so patient dc'd in stable condition with no concerns.  Gerrit Halls, RN 06/16/2022 1:19 PM

## 2022-06-18 ENCOUNTER — Other Ambulatory Visit: Payer: Self-pay

## 2022-06-22 ENCOUNTER — Other Ambulatory Visit: Payer: Self-pay

## 2022-06-23 ENCOUNTER — Other Ambulatory Visit: Payer: Commercial Managed Care - PPO

## 2022-06-23 ENCOUNTER — Inpatient Hospital Stay: Payer: Commercial Managed Care - PPO

## 2022-06-23 ENCOUNTER — Other Ambulatory Visit: Payer: Self-pay

## 2022-06-23 ENCOUNTER — Telehealth: Payer: Self-pay | Admitting: Internal Medicine

## 2022-06-23 ENCOUNTER — Other Ambulatory Visit: Payer: Self-pay | Admitting: Physician Assistant

## 2022-06-23 ENCOUNTER — Telehealth: Payer: Self-pay | Admitting: Medical Oncology

## 2022-06-23 VITALS — BP 123/69 | HR 110 | Temp 98.8°F | Resp 18

## 2022-06-23 DIAGNOSIS — C3491 Malignant neoplasm of unspecified part of right bronchus or lung: Secondary | ICD-10-CM

## 2022-06-23 DIAGNOSIS — Z5112 Encounter for antineoplastic immunotherapy: Secondary | ICD-10-CM | POA: Diagnosis not present

## 2022-06-23 DIAGNOSIS — T451X5A Adverse effect of antineoplastic and immunosuppressive drugs, initial encounter: Secondary | ICD-10-CM

## 2022-06-23 LAB — CBC WITH DIFFERENTIAL (CANCER CENTER ONLY)
Abs Immature Granulocytes: 0 10*3/uL (ref 0.00–0.07)
Basophils Absolute: 0 10*3/uL (ref 0.0–0.1)
Basophils Relative: 1 %
Eosinophils Absolute: 0 10*3/uL (ref 0.0–0.5)
Eosinophils Relative: 2 %
HCT: 27.9 % — ABNORMAL LOW (ref 36.0–46.0)
Hemoglobin: 9.6 g/dL — ABNORMAL LOW (ref 12.0–15.0)
Immature Granulocytes: 0 %
Lymphocytes Relative: 45 %
Lymphs Abs: 0.5 10*3/uL — ABNORMAL LOW (ref 0.7–4.0)
MCH: 35 pg — ABNORMAL HIGH (ref 26.0–34.0)
MCHC: 34.4 g/dL (ref 30.0–36.0)
MCV: 101.8 fL — ABNORMAL HIGH (ref 80.0–100.0)
Monocytes Absolute: 0.2 10*3/uL (ref 0.1–1.0)
Monocytes Relative: 14 %
Neutro Abs: 0.4 10*3/uL — CL (ref 1.7–7.7)
Neutrophils Relative %: 38 %
Platelet Count: 57 10*3/uL — ABNORMAL LOW (ref 150–400)
RBC: 2.74 MIL/uL — ABNORMAL LOW (ref 3.87–5.11)
RDW: 15.7 % — ABNORMAL HIGH (ref 11.5–15.5)
Smear Review: NORMAL
WBC Count: 1.1 10*3/uL — ABNORMAL LOW (ref 4.0–10.5)
nRBC: 0 % (ref 0.0–0.2)

## 2022-06-23 LAB — CMP (CANCER CENTER ONLY)
ALT: 61 U/L — ABNORMAL HIGH (ref 0–44)
AST: 37 U/L (ref 15–41)
Albumin: 4.3 g/dL (ref 3.5–5.0)
Alkaline Phosphatase: 114 U/L (ref 38–126)
Anion gap: 6 (ref 5–15)
BUN: 15 mg/dL (ref 6–20)
CO2: 29 mmol/L (ref 22–32)
Calcium: 9.9 mg/dL (ref 8.9–10.3)
Chloride: 106 mmol/L (ref 98–111)
Creatinine: 0.81 mg/dL (ref 0.44–1.00)
GFR, Estimated: >60 mL/min (ref >60)
Glucose, Bld: 107 mg/dL — ABNORMAL HIGH (ref 70–99)
Potassium: 4.1 mmol/L (ref 3.5–5.1)
Sodium: 141 mmol/L (ref 135–145)
Total Bilirubin: 0.4 mg/dL (ref 0.3–1.2)
Total Protein: 7.2 g/dL (ref 6.5–8.1)

## 2022-06-23 MED ORDER — FILGRASTIM-SNDZ 300 MCG/0.5ML IJ SOSY
300.0000 ug | PREFILLED_SYRINGE | Freq: Once | INTRAMUSCULAR | Status: AC
  Administered 2022-06-23: 300 ug via SUBCUTANEOUS
  Filled 2022-06-23: qty 0.5

## 2022-06-23 NOTE — Telephone Encounter (Signed)
Scheduled per 05/22 scheduling message, patient has been called and notified.

## 2022-06-23 NOTE — Telephone Encounter (Signed)
CRITICAL VALUE STICKER  CRITICAL VALUE:ANC=0.4  RECEIVER (on-site recipient of call):Cierria Height  DATE & TIME NOTIFIED: 06/23/22 @   MESSENGER (representative from lab):Herbert Seta  MD NOTIFIED: Si Gaul, MD  TIME OF NOTIFICATION:0925  RESPONSE:  2 Days of GCSF

## 2022-06-24 ENCOUNTER — Inpatient Hospital Stay: Payer: Commercial Managed Care - PPO

## 2022-06-24 ENCOUNTER — Ambulatory Visit: Payer: Commercial Managed Care - PPO

## 2022-06-24 VITALS — BP 110/69 | HR 103 | Temp 98.5°F | Resp 18

## 2022-06-24 DIAGNOSIS — T451X5A Adverse effect of antineoplastic and immunosuppressive drugs, initial encounter: Secondary | ICD-10-CM

## 2022-06-24 DIAGNOSIS — Z5112 Encounter for antineoplastic immunotherapy: Secondary | ICD-10-CM | POA: Diagnosis not present

## 2022-06-24 MED ORDER — FILGRASTIM-SNDZ 300 MCG/0.5ML IJ SOSY
300.0000 ug | PREFILLED_SYRINGE | Freq: Once | INTRAMUSCULAR | Status: AC
  Administered 2022-06-24: 300 ug via SUBCUTANEOUS
  Filled 2022-06-24: qty 0.5

## 2022-06-27 ENCOUNTER — Other Ambulatory Visit: Payer: Self-pay

## 2022-06-29 ENCOUNTER — Other Ambulatory Visit: Payer: Self-pay | Admitting: Radiology

## 2022-06-29 ENCOUNTER — Ambulatory Visit: Payer: Commercial Managed Care - PPO | Admitting: Internal Medicine

## 2022-06-29 ENCOUNTER — Other Ambulatory Visit: Payer: Commercial Managed Care - PPO

## 2022-06-29 ENCOUNTER — Ambulatory Visit: Payer: Commercial Managed Care - PPO

## 2022-06-29 NOTE — H&P (Signed)
Referring Physician(s): Mohamed,Mohamed  Supervising Physician: Marliss Coots  Patient Status:  [facility]  Chief Complaint:  "I'm here for a port a cath"  Subjective: Patient known to IR service from right cervical/supraclavicular lymph node biopsy on 10/01/2021 and left supraclavicular lymph node biopsy on 04/12/2022.  She is a 57 year old female with past medical history significant for COPD, GERD, hearing loss, hyperlipidemia, migraine headaches, pancreatitis who presents now with recurrent/metastatic non-small cell lung cancer. She was initially diagnosed as stage IIIb non-small cell lung cancer/adenocarcinoma and presented with right upper lobe nodule in addition to bulky right hilar/mediastinal/left subclavicular lymphadenopathy in November 2022.  She had evidence for disease recurrence in the mediastinal and right supraclavicular lymph nodes in August 2023.  She now has evidence of metastatic disease in February of this year with supraclavicular/thoracic/precarinal lymphadenopathy as well as upper abdominal lymph nodes and small liver lesion.  She presents today for Port-A-Cath placement to assist with treatment.    Past Medical History:  Diagnosis Date   Abdominal discomfort    Cancer (HCC)    Chronic headaches    due to allergies, sinus   COPD (chronic obstructive pulmonary disease) (HCC)    per 2012 chest xray   pt states she doesn not have this now (04/10/2013)   Deviated nasal septum    Eustachian tube dysfunction    GERD (gastroesophageal reflux disease)    occasional uses Tums / Rolaids   Hearing loss    right ear   High cholesterol    History of radiation therapy    right lung 01/07/2021-02/19/2021  Dr Antony Blackbird   Migraine    "only once in a blue moon since RX'd allergy shots" (04/10/2013)   Pancreatitis 02/08/2013   Pneumonia    Rhinitis, allergic    Past Surgical History:  Procedure Laterality Date   ABDOMINAL HYSTERECTOMY  1995   tx endometriosis, both  ovaries removed   APPENDECTOMY  late 1990's   BIOPSY  02/26/2021   Procedure: BIOPSY;  Surgeon: Rachael Fee, MD;  Location: [facility];  Service: Endoscopy;;   BRONCHIAL BRUSHINGS  12/15/2020   Procedure: BRONCHIAL BRUSHINGS;  Surgeon: Leslye Peer, MD;  Location: [facility] ENDOSCOPY;  Service: Cardiopulmonary;;   BRONCHIAL NEEDLE ASPIRATION BIOPSY  12/15/2020   Procedure: BRONCHIAL NEEDLE ASPIRATION BIOPSIES;  Surgeon: Leslye Peer, MD;  Location: [facility] ENDOSCOPY;  Service: Cardiopulmonary;;   CHOLECYSTECTOMY  04/10/2013   CHOLECYSTECTOMY N/A 04/10/2013   Procedure: LAPAROSCOPIC CHOLECYSTECTOMY WITH INTRAOPERATIVE CHOLANGIOGRAM;  Surgeon: Adolph Pollack, MD;  Location: [facility] OR;  Service: General;  Laterality: N/A;   ELECTROMAGNETIC NAVIGATION BROCHOSCOPY  12/15/2020   Procedure: ELECTROMAGNETIC NAVIGATION BRONCHOSCOPY;  Surgeon: Leslye Peer, MD;  Location: [facility] ENDOSCOPY;  Service: Cardiopulmonary;;   ESOPHAGOGASTRODUODENOSCOPY (EGD) WITH PROPOFOL N/A 02/26/2021   Procedure: ESOPHAGOGASTRODUODENOSCOPY (EGD) WITH PROPOFOL;  Surgeon: Rachael Fee, MD;  Location: [facility] ENDOSCOPY;  Service: Endoscopy;  Laterality: N/A;   EUS N/A 02/16/2013   Procedure: UPPER ENDOSCOPIC ULTRASOUND (EUS) LINEAR;  Surgeon: Rachael Fee, MD;  Location: [facility]OS[facility];  Service: Endoscopy;  Laterality: N/A;  radial linear   KNEE ARTHROSCOPY Right 1980's   "cartilage OR"   LAPAROSCOPIC ENDOMETRIOSIS FULGURATION  1980's   MYRINGOTOMY WITH TUBE PLACEMENT Right 07/13/2018   Procedure: MYRINGOTOMY WITH TUBE PLACEMENT;  Surgeon: Vernie Murders, MD;  Location: [facility];  Service: ENT;  Laterality: Right;   MYRINGOTOMY WITH TUBE PLACEMENT Right 06/04/2021   Procedure: MYRINGOTOMY WITH BUTTERFLY TUBE PLACEMENT;  Surgeon: Vernie Murders,  MD;  Location: [facility];  Service: ENT;  Laterality: Right;   NASOPHARYNGOSCOPY EUSTATION TUBE BALLOON DILATION Right 07/13/2018   Procedure: NASOPHARYNGOSCOPY  EUSTATION TUBE BALLOON DILATION;  Surgeon: Vernie Murders, MD;  Location: [facility];  Service: ENT;  Laterality: Right;   TONSILLECTOMY AND ADENOIDECTOMY  ~ 1980   adenoidectomy   TUBAL LIGATION  ~ 1987   TURBINATE REDUCTION Right 07/13/2018   Procedure: OUTFRACTURE TURBINATE;  Surgeon: Vernie Murders, MD;  Location: [facility];  Service: ENT;  Laterality: Right;   VIDEO BRONCHOSCOPY WITH ENDOBRONCHIAL ULTRASOUND N/A 12/15/2020   Procedure: ROBOTIC VIDEO BRONCHOSCOPY WITH ENDOBRONCHIAL ULTRASOUND;  Surgeon: Leslye Peer, MD;  Location: [facility] ENDOSCOPY;  Service: Cardiopulmonary;  Laterality: N/A;   WRIST SURGERY Left    w/plate      Allergies: Carboplatin  Medications: Prior to Admission medications   Medication Sig Start Date End Date Taking? Authorizing Provider  acetaminophen (TYLENOL) 500 MG tablet Take 1,000 mg by mouth every 6 (six) hours as needed for moderate pain.    [provider]  albuterol (PROVENTIL) (2.5 MG/3ML) 0.083% nebulizer solution albuterol sulfate 2.5 mg/3 mL (0.083 %) solution for nebulization  USE 1 VIAL IN NEBULIZER EVERY 6 HOURS AS NEEDED FOR WHEEZING OR SHORTNESS OF BREATH    [provider]  aspirin EC 81 MG tablet Take 81 mg by mouth at bedtime.    [provider]  atorvastatin (LIPITOR) 40 MG tablet Take 40 mg by mouth at bedtime.    [provider]  benzonatate (TESSALON) 200 MG capsule Take 200 mg by mouth 3 (three) times daily as needed. 10/23/21   [provider]  Biotin 5000 MCG TABS Take 5,000 mcg by mouth at bedtime.    [provider]  buPROPion (WELLBUTRIN XL) 150 MG 24 hr tablet Take 150 mg by mouth every morning. 04/12/22   [provider]  Ca Carbonate-Mag Hydroxide (ROLAIDS PO) Take 1 tablet by mouth daily as needed (heartburn). Patient not taking: Reported on 05/21/2022    [provider]  famotidine (PEPCID) 40 MG tablet Take 40 mg by mouth daily.     [provider]  fluticasone (FLONASE) 50 MCG/ACT nasal spray Place 2 sprays into both nostrils daily as needed for allergies.    [provider]  folic acid (FOLVITE) 1 MG tablet Take 1 tablet by mouth once daily 06/15/22   Heilingoetter, Cassandra L, PA-C  hydrocortisone 1 % ointment Apply 1 Application topically 2 (two) times daily. 04/19/22   Walisiewicz, Yvonna Alanis E, PA-C  linaCLOtide (LINZESS PO) Take by mouth. Patient not taking: Reported on 05/21/2022    [provider]  loratadine (CLARITIN) 10 MG tablet TAKE 1 TABLET BY MOUTH EVERY DAY 04/12/22   Leslye Peer, MD  magic mouthwash SOLN Take 5 mLs by mouth 4 (four) times daily as needed for mouth pain. 05/06/22   Si Gaul, MD  Multiple Vitamins-Minerals [facility] SKIN AND NAILS FORMULA PO) Take 1 tablet by mouth daily.    [provider]  ondansetron (ZOFRAN-ODT) 8 MG disintegrating tablet Take 1 tablet (8 mg total) by mouth 3 (three) times daily as needed. 06/16/22   Si Gaul, MD  Oxycodone HCl 10 MG TABS Take 1 tablet by mouth 3 (three) times daily as needed (pain).    [provider]  PARoxetine (PAXIL) 10 MG tablet Take 10 mg by mouth daily.    Millsaps, Cala Bradford, NP  prochlorperazine (COMPAZINE) 10 MG tablet Take 1 tablet (10  mg total) by mouth every 6 (six) hours as needed. 03/29/22   Heilingoetter, Cassandra L, PA-C  rizatriptan (MAXALT-MLT) 10 MG disintegrating tablet Take 10 mg by mouth as needed for migraine. May repeat in 2 hours if needed    [provider]  sertraline (ZOLOFT) 50 MG tablet Take 1 tablet by mouth daily.    [provider]  Tiotropium Bromide-Olodaterol (STIOLTO RESPIMAT) 2.5-2.5 MCG/ACT AERS Inhale 2 puffs into the lungs daily. 11/26/21   Leslye Peer, MD     Vital Signs:    Code Status:   Physical Exam  Imaging: No results found.  Labs:  CBC: Recent Labs    06/02/22 0834 06/09/22 0830 06/16/22 0752 06/23/22 0841  WBC  1.3* 2.8* 3.2* 1.1*  HGB 10.6* 10.8* 10.4* 9.6*  HCT 30.9* 31.8* 30.0* 27.9*  PLT 161 82* 109* 57*    COAGS: Recent Labs    04/12/22 1210  INR 2.3*    BMP: Recent Labs    06/02/22 0834 06/09/22 0830 06/16/22 0752 06/23/22 0841  NA 142 142 141 141  K 3.7 4.0 3.6 4.1  CL 107 106 107 106  CO2 28 28 26 29   GLUCOSE 88 125* 97 107*  BUN 12 16 14 15   CALCIUM 9.2 9.2 9.3 9.9  CREATININE 0.70 0.90 0.84 0.81  GFRNONAA >60 >60 >60 >60    LIVER FUNCTION TESTS: Recent Labs    06/02/22 0834 06/09/22 0830 06/16/22 0752 06/23/22 0841  BILITOT 0.5 0.4 0.5 0.4  AST 28 36 36 37  ALT 42 54* 56* 61*  ALKPHOS 127* 137* 107 114  PROT 6.9 7.1 7.0 7.2  ALBUMIN 4.0 4.0 4.0 4.3    Assessment and Plan: 57 year old female with past medical history significant for COPD, GERD, hearing loss, hyperlipidemia, migraine headaches, pancreatitis who presents now with recurrent/metastatic non-small cell lung cancer. She was initially diagnosed as stage IIIb non-small cell lung cancer/adenocarcinoma and presented with right upper lobe nodule in addition to bulky right hilar/mediastinal/left subclavicular lymphadenopathy in November 2022.  She had evidence for disease recurrence in the mediastinal and right supraclavicular lymph nodes in August 2023.  She now has evidence of metastatic disease in February of this year with supraclavicular/thoracic/precarinal lymphadenopathy as well as upper abdominal lymph nodes and small liver lesion.  She presents today for Port-A-Cath placement to assist with treatment.Risks and benefits of image guided port-a-catheter placement was discussed with the patient including, but not limited to bleeding, infection, pneumothorax, or fibrin sheath development and need for additional procedures.  All of the patient's questions were answered, patient is agreeable to proceed. Consent signed and in chart.    Electronically Signed: D. Jeananne Rama, PA-C 06/29/2022, 3:13  PM   I spent a total of 25 minutes at the the patient's bedside AND on the patient's hospital floor or unit, greater than 50% of which was counseling/coordinating care for port a cath placement

## 2022-06-30 ENCOUNTER — Ambulatory Visit (HOSPITAL_COMMUNITY)
Admission: RE | Admit: 2022-06-30 | Discharge: 2022-06-30 | Disposition: A | Discharge status: Home / Self Care | Payer: Commercial Managed Care - PPO | Source: Ambulatory Visit | Attending: Internal Medicine | Admitting: Internal Medicine

## 2022-06-30 ENCOUNTER — Encounter (HOSPITAL_COMMUNITY): Payer: Self-pay

## 2022-06-30 ENCOUNTER — Other Ambulatory Visit: Payer: Self-pay

## 2022-06-30 ENCOUNTER — Other Ambulatory Visit: Payer: Self-pay | Admitting: Physician Assistant

## 2022-06-30 ENCOUNTER — Other Ambulatory Visit: Payer: Self-pay | Admitting: Internal Medicine

## 2022-06-30 ENCOUNTER — Other Ambulatory Visit: Payer: Commercial Managed Care - PPO

## 2022-06-30 ENCOUNTER — Inpatient Hospital Stay: Payer: Commercial Managed Care - PPO

## 2022-06-30 DIAGNOSIS — C3491 Malignant neoplasm of unspecified part of right bronchus or lung: Secondary | ICD-10-CM

## 2022-06-30 DIAGNOSIS — I878 Other specified disorders of veins: Secondary | ICD-10-CM

## 2022-06-30 HISTORY — PX: IR IMAGING GUIDED PORT INSERTION: IMG5740

## 2022-06-30 LAB — CBC WITH DIFFERENTIAL (CANCER CENTER ONLY)
Abs Immature Granulocytes: 0.01 10*3/uL (ref 0.00–0.07)
Basophils Absolute: 0 10*3/uL (ref 0.0–0.1)
Basophils Relative: 1 %
Eosinophils Absolute: 0 10*3/uL (ref 0.0–0.5)
Eosinophils Relative: 1 %
HCT: 25.4 % — ABNORMAL LOW (ref 36.0–46.0)
Hemoglobin: 8.9 g/dL — ABNORMAL LOW (ref 12.0–15.0)
Immature Granulocytes: 1 %
Lymphocytes Relative: 23 %
Lymphs Abs: 0.5 10*3/uL — ABNORMAL LOW (ref 0.7–4.0)
MCH: 36.2 pg — ABNORMAL HIGH (ref 26.0–34.0)
MCHC: 35 g/dL (ref 30.0–36.0)
MCV: 103.3 fL — ABNORMAL HIGH (ref 80.0–100.0)
Monocytes Absolute: 0.4 10*3/uL (ref 0.1–1.0)
Monocytes Relative: 18 %
Neutro Abs: 1.2 10*3/uL — ABNORMAL LOW (ref 1.7–7.7)
Neutrophils Relative %: 56 %
Platelet Count: 53 10*3/uL — ABNORMAL LOW (ref 150–400)
RBC: 2.46 MIL/uL — ABNORMAL LOW (ref 3.87–5.11)
RDW: 16.6 % — ABNORMAL HIGH (ref 11.5–15.5)
WBC Count: 2.1 10*3/uL — ABNORMAL LOW (ref 4.0–10.5)
nRBC: 0 % (ref 0.0–0.2)

## 2022-06-30 LAB — CMP (CANCER CENTER ONLY)
ALT: 96 U/L — ABNORMAL HIGH (ref 0–44)
AST: 50 U/L — ABNORMAL HIGH (ref 15–41)
Albumin: 4 g/dL (ref 3.5–5.0)
Alkaline Phosphatase: 114 U/L (ref 38–126)
Anion gap: 5 (ref 5–15)
BUN: 10 mg/dL (ref 6–20)
CO2: 29 mmol/L (ref 22–32)
Calcium: 9.1 mg/dL (ref 8.9–10.3)
Chloride: 109 mmol/L (ref 98–111)
Creatinine: 0.84 mg/dL (ref 0.44–1.00)
GFR, Estimated: >60 mL/min (ref >60)
Glucose, Bld: 91 mg/dL (ref 70–99)
Potassium: 4.3 mmol/L (ref 3.5–5.1)
Sodium: 143 mmol/L (ref 135–145)
Total Bilirubin: 0.5 mg/dL (ref 0.3–1.2)
Total Protein: 6.9 g/dL (ref 6.5–8.1)

## 2022-06-30 MED ORDER — HEPARIN SOD (PORK) LOCK FLUSH 100 UNIT/ML IV SOLN
INTRAVENOUS | Status: AC
  Filled 2022-06-30: qty 5

## 2022-06-30 MED ORDER — MIDAZOLAM HCL 2 MG/2ML IJ SOLN
INTRAMUSCULAR | Status: AC | PRN
  Administered 2022-06-30 (×2): 1 mg via INTRAVENOUS

## 2022-06-30 MED ORDER — FENTANYL CITRATE (PF) 100 MCG/2ML IJ SOLN
INTRAMUSCULAR | Status: AC | PRN
  Administered 2022-06-30 (×2): 50 ug via INTRAVENOUS

## 2022-06-30 MED ORDER — DIPHENHYDRAMINE HCL 50 MG/ML IJ SOLN
INTRAMUSCULAR | Status: AC
  Filled 2022-06-30: qty 1

## 2022-06-30 MED ORDER — LIDOCAINE-EPINEPHRINE 1 %-1:100000 IJ SOLN
INTRAMUSCULAR | Status: AC
  Filled 2022-06-30: qty 1

## 2022-06-30 MED ORDER — HEPARIN SOD (PORK) LOCK FLUSH 100 UNIT/ML IV SOLN
500.0000 [IU] | Freq: Once | INTRAVENOUS | Status: AC
  Administered 2022-06-30: 500 [IU] via INTRAVENOUS

## 2022-06-30 MED ORDER — SODIUM CHLORIDE 0.9 % IV SOLN: INTRAVENOUS | Status: DC

## 2022-06-30 MED ORDER — FENTANYL CITRATE (PF) 100 MCG/2ML IJ SOLN
INTRAMUSCULAR | Status: AC
  Filled 2022-06-30: qty 2

## 2022-06-30 MED ORDER — LIDOCAINE-EPINEPHRINE 1 %-1:100000 IJ SOLN
20.0000 mL | Freq: Once | INTRAMUSCULAR | Status: AC
  Administered 2022-06-30: 20 mL via INTRADERMAL

## 2022-06-30 MED ORDER — MIDAZOLAM HCL 2 MG/2ML IJ SOLN
INTRAMUSCULAR | Status: AC
  Filled 2022-06-30: qty 2

## 2022-06-30 NOTE — Discharge Instructions (Signed)

## 2022-07-07 ENCOUNTER — Other Ambulatory Visit: Payer: Self-pay

## 2022-07-07 ENCOUNTER — Inpatient Hospital Stay: Payer: Commercial Managed Care - PPO

## 2022-07-07 ENCOUNTER — Inpatient Hospital Stay: Payer: Commercial Managed Care - PPO | Attending: Internal Medicine | Admitting: Internal Medicine

## 2022-07-07 ENCOUNTER — Other Ambulatory Visit: Payer: Commercial Managed Care - PPO

## 2022-07-07 VITALS — BP 120/73 | HR 95 | Temp 97.8°F | Resp 16 | Wt 107.5 lb

## 2022-07-07 DIAGNOSIS — Z5111 Encounter for antineoplastic chemotherapy: Secondary | ICD-10-CM | POA: Diagnosis present

## 2022-07-07 DIAGNOSIS — C787 Secondary malignant neoplasm of liver and intrahepatic bile duct: Secondary | ICD-10-CM | POA: Insufficient documentation

## 2022-07-07 DIAGNOSIS — D696 Thrombocytopenia, unspecified: Secondary | ICD-10-CM | POA: Diagnosis not present

## 2022-07-07 DIAGNOSIS — J441 Chronic obstructive pulmonary disease with (acute) exacerbation: Secondary | ICD-10-CM | POA: Diagnosis not present

## 2022-07-07 DIAGNOSIS — C3411 Malignant neoplasm of upper lobe, right bronchus or lung: Secondary | ICD-10-CM | POA: Diagnosis present

## 2022-07-07 DIAGNOSIS — R221 Localized swelling, mass and lump, neck: Secondary | ICD-10-CM | POA: Diagnosis not present

## 2022-07-07 DIAGNOSIS — R1310 Dysphagia, unspecified: Secondary | ICD-10-CM | POA: Diagnosis not present

## 2022-07-07 DIAGNOSIS — Z5112 Encounter for antineoplastic immunotherapy: Secondary | ICD-10-CM | POA: Diagnosis present

## 2022-07-07 DIAGNOSIS — Z452 Encounter for adjustment and management of vascular access device: Secondary | ICD-10-CM | POA: Diagnosis not present

## 2022-07-07 DIAGNOSIS — D649 Anemia, unspecified: Secondary | ICD-10-CM | POA: Insufficient documentation

## 2022-07-07 DIAGNOSIS — C3491 Malignant neoplasm of unspecified part of right bronchus or lung: Secondary | ICD-10-CM

## 2022-07-07 DIAGNOSIS — D72819 Decreased white blood cell count, unspecified: Secondary | ICD-10-CM | POA: Insufficient documentation

## 2022-07-07 LAB — CBC WITH DIFFERENTIAL (CANCER CENTER ONLY)
Abs Immature Granulocytes: 0 10*3/uL (ref 0.00–0.07)
Basophils Absolute: 0 10*3/uL (ref 0.0–0.1)
Basophils Relative: 0 %
Eosinophils Absolute: 0 10*3/uL (ref 0.0–0.5)
Eosinophils Relative: 0 %
HCT: 24.8 % — ABNORMAL LOW (ref 36.0–46.0)
Hemoglobin: 8.6 g/dL — ABNORMAL LOW (ref 12.0–15.0)
Immature Granulocytes: 0 %
Lymphocytes Relative: 16 %
Lymphs Abs: 0.4 10*3/uL — ABNORMAL LOW (ref 0.7–4.0)
MCH: 36.4 pg — ABNORMAL HIGH (ref 26.0–34.0)
MCHC: 34.7 g/dL (ref 30.0–36.0)
MCV: 105.1 fL — ABNORMAL HIGH (ref 80.0–100.0)
Monocytes Absolute: 0.3 10*3/uL (ref 0.1–1.0)
Monocytes Relative: 13 %
Neutro Abs: 1.8 10*3/uL (ref 1.7–7.7)
Neutrophils Relative %: 71 %
Platelet Count: 80 10*3/uL — ABNORMAL LOW (ref 150–400)
RBC: 2.36 MIL/uL — ABNORMAL LOW (ref 3.87–5.11)
RDW: 17.3 % — ABNORMAL HIGH (ref 11.5–15.5)
WBC Count: 2.5 10*3/uL — ABNORMAL LOW (ref 4.0–10.5)
nRBC: 0 % (ref 0.0–0.2)

## 2022-07-07 LAB — CMP (CANCER CENTER ONLY)
ALT: 40 U/L (ref 0–44)
AST: 25 U/L (ref 15–41)
Albumin: 4 g/dL (ref 3.5–5.0)
Alkaline Phosphatase: 112 U/L (ref 38–126)
Anion gap: 8 (ref 5–15)
BUN: 12 mg/dL (ref 6–20)
CO2: 26 mmol/L (ref 22–32)
Calcium: 9.3 mg/dL (ref 8.9–10.3)
Chloride: 108 mmol/L (ref 98–111)
Creatinine: 0.84 mg/dL (ref 0.44–1.00)
GFR, Estimated: >60 mL/min (ref >60)
Glucose, Bld: 97 mg/dL (ref 70–99)
Potassium: 3.6 mmol/L (ref 3.5–5.1)
Sodium: 142 mmol/L (ref 135–145)
Total Bilirubin: 0.5 mg/dL (ref 0.3–1.2)
Total Protein: 6.9 g/dL (ref 6.5–8.1)

## 2022-07-07 MED ORDER — LIDOCAINE-PRILOCAINE 2.5-2.5 % EX CREA: TOPICAL_CREAM | CUTANEOUS | 0 refills | Status: DC

## 2022-07-07 MED ORDER — SODIUM CHLORIDE 0.9% FLUSH
10.0000 mL | INTRAVENOUS | Status: DC | PRN
  Administered 2022-07-07: 10 mL via INTRAVENOUS

## 2022-07-07 MED ORDER — HEPARIN SOD (PORK) LOCK FLUSH 100 UNIT/ML IV SOLN
500.0000 [IU] | Freq: Once | INTRAVENOUS | Status: AC
  Administered 2022-07-07: 500 [IU] via INTRAVENOUS

## 2022-07-07 MED ORDER — BENZONATATE 200 MG PO CAPS: 200.0000 mg | ORAL_CAPSULE | Freq: Three times a day (TID) | ORAL | 0 refills | Status: AC | PRN

## 2022-07-07 MED ORDER — BENZONATATE 200 MG PO CAPS: 200.0000 mg | ORAL_CAPSULE | Freq: Three times a day (TID) | ORAL | 0 refills | Status: DC | PRN

## 2022-07-07 NOTE — Progress Notes (Signed)
[facility] [phone]   [phone]  OFFICE PROGRESS NOTE  Julie Panda, NP [address] [place] [address]  DIAGNOSIS: Recurrent/metastatic non-small cell lung cancer initially diagnosed as stage IIIB  (T1b, N3, M0) non-small cell lung cancer, adenocarcinoma she presented with right upper lobe nodule in addition to bulky right hilar, mediastinal, and left supraclavicular lymphadenopathy diagnosed in November 2022.  The patient had evidence for disease recurrence in the mediastinal and right supraclavicular lymphadenopathy in August 2023.  The patient had evidence of metastatic disease in February 2024 with several supraclavicular, thoracic and precarinal lymphadenopathy as well as upper abdominal lymph nodes and small liver lesion.  DETECTED ALTERATION(S) / BIOMARKER(S) % CFDNA OR AMPLIFICATION ASSOCIATED FDA-APPROVED THERAPIES CLINICAL TRIAL AVAILABILITY [id number] ND 0.5 5 50 100 4.7%  RHOAG17E ND 0.5 5 50 100 1.8%  [id number] ND 0.5 5 50 100 1.9%  BIOMARKER ADDITIONAL DETAILS Tumor Mutational Burden (TMB) 19.02 mut/Mb MSI Status Stable (MSS) PD-L1 Tumor Proportion Score (TPS)* <1%  Biomarker Findings By [facility] on 05/06/2022 Tumor Mutational Burden - 17 Muts/Mb Microsatellite status - MS-Stable Genomic Findings For a complete list of the genes assayed, please refer to the Appendix. CTNNB1 S37C CRKL amplification MAP2K4 loss exons 2-11 TP53 V143A 8 Disease relevant genes with no reportable alterations: ALK, BRAF, EGFR, ERBB2, KRAS, MET, RET, ROS1  PDL1 0%   PRIOR THERAPY:  1) Concurrent chemoradiation with carboplatin for an AUC of 2 and paclitaxel 45 mg per metered squared.  First dose on 01/05/2021.  Status post 7 cycles of treatment.  Last dose was given February 16, 2021. 2) Consolidation immunotherapy with Imfinzi 1500 Mg IV every 4 weeks.  First dose March 25, 2021.  Status post 3 cycles.  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 3) Palliative radiotherapy to the enlarging right supraclavicular lymphadenopathy under the care of Dr. Roselind Messier expected to be completed on October 28, 2021.   CURRENT THERAPY: Palliative systemic chemotherapy with carboplatin for AUC of 5, Alimta 500 Mg/M2 and Keytruda 200 Mg IV every 3 weeks.  First dose April 05, 2022.  Status post 4 cycles.  Starting from cycle #3 her dose of carboplatin was reduced to AUC of 4 and Alimta 400 Mg/M2.  Starting from cycle #5 the patient will be on maintenance treatment with Alimta and Keytruda every 3 weeks.  INTERVAL HISTORY: Julie Nicholson 57 y.o. female returns to the clinic today for follow-up visit accompanied by her daughter Julie Nicholson.  The patient is feeling fine today with no concerning complaints except for the baseline fatigue and weakness.  She also has mild cough and requesting refill of Tessalon.  She denied having any chest pain, shortness of breath or hemoptysis.  She has no nausea, vomiting, diarrhea or constipation.  She has no headache or visual changes.  She has no recent weight loss or night sweats.  She has been tolerating her treatment fairly well.  She is here today for evaluation before starting cycle #5 of her treatment.   MEDICAL HISTORY: Past Medical History:  Diagnosis Date   Abdominal discomfort    Cancer (HCC)    Chronic headaches    due to allergies, sinus   COPD (chronic obstructive pulmonary disease) (HCC)    per 2012 chest xray   pt states she doesn not have this now (04/10/2013)   Deviated nasal septum    Eustachian tube dysfunction    GERD (gastroesophageal reflux disease)  occasional uses Tums / Rolaids   Hearing loss    right ear   High cholesterol    History of radiation therapy    right lung 01/07/2021-02/19/2021  Dr Antony Blackbird   Migraine    "only once in a blue moon since RX'd allergy shots" (04/10/2013)   Pancreatitis 02/08/2013    Pneumonia    Rhinitis, allergic     ALLERGIES:  is allergic to carboplatin.  MEDICATIONS:  Current Outpatient Medications  Medication Sig Dispense Refill   acetaminophen (TYLENOL) 500 MG tablet Take 1,000 mg by mouth every 6 (six) hours as needed for moderate pain.     albuterol (PROVENTIL) (2.5 MG/3ML) 0.083% nebulizer solution albuterol sulfate 2.5 mg/3 mL (0.083 %) solution for nebulization  USE 1 VIAL IN NEBULIZER EVERY 6 HOURS AS NEEDED FOR WHEEZING OR SHORTNESS OF BREATH     aspirin EC 81 MG tablet Take 81 mg by mouth at bedtime.     atorvastatin (LIPITOR) 40 MG tablet Take 40 mg by mouth at bedtime.     benzonatate (TESSALON) 200 MG capsule Take 200 mg by mouth 3 (three) times daily as needed.     Biotin 5000 MCG TABS Take 5,000 mcg by mouth at bedtime.     buPROPion (WELLBUTRIN XL) 150 MG 24 hr tablet Take 150 mg by mouth every morning.     Ca Carbonate-Mag Hydroxide (ROLAIDS PO) Take 1 tablet by mouth daily as needed (heartburn). (Patient not taking: Reported on 05/21/2022)     famotidine (PEPCID) 40 MG tablet Take 40 mg by mouth daily.     fluticasone (FLONASE) 50 MCG/ACT nasal spray Place 2 sprays into both nostrils daily as needed for allergies.     folic acid (FOLVITE) 1 MG tablet Take 1 tablet by mouth once daily 30 tablet 0   hydrocortisone 1 % ointment Apply 1 Application topically 2 (two) times daily. 30 g 0   linaCLOtide (LINZESS PO) Take by mouth. (Patient not taking: Reported on 05/21/2022)     loratadine (CLARITIN) 10 MG tablet TAKE 1 TABLET BY MOUTH EVERY DAY 90 tablet 1   magic mouthwash SOLN Take 5 mLs by mouth 4 (four) times daily as needed for mouth pain. 240 mL 0   Multiple Vitamins-Minerals (HAIR SKIN AND NAILS FORMULA PO) Take 1 tablet by mouth daily.     ondansetron (ZOFRAN-ODT) 8 MG disintegrating tablet Take 1 tablet (8 mg total) by mouth 3 (three) times daily as needed. 20 tablet 1   Oxycodone HCl 10 MG TABS Take 1 tablet by mouth 3 (three) times daily as  needed (pain).     PARoxetine (PAXIL) 10 MG tablet Take 10 mg by mouth daily.     prochlorperazine (COMPAZINE) 10 MG tablet Take 1 tablet (10 mg total) by mouth every 6 (six) hours as needed. 30 tablet 2   rizatriptan (MAXALT-MLT) 10 MG disintegrating tablet Take 10 mg by mouth as needed for migraine. May repeat in 2 hours if needed     sertraline (ZOLOFT) 50 MG tablet Take 1 tablet by mouth daily.     Tiotropium Bromide-Olodaterol (STIOLTO RESPIMAT) 2.5-2.5 MCG/ACT AERS Inhale 2 puffs into the lungs daily. 4 g 3   No current facility-administered medications for this visit.    SURGICAL HISTORY:  Past Surgical History:  Procedure Laterality Date   ABDOMINAL HYSTERECTOMY  1995   tx endometriosis, both ovaries removed   APPENDECTOMY  late 1990's   BIOPSY  02/26/2021   Procedure: BIOPSY;  Surgeon: Christella Hartigan,  Melton Alar, MD;  Location: [facility] ENDOSCOPY;  Service: Endoscopy;;   BRONCHIAL BRUSHINGS  12/15/2020   Procedure: BRONCHIAL BRUSHINGS;  Surgeon: Leslye Peer, MD;  Location: [facility] ENDOSCOPY;  Service: Cardiopulmonary;;   BRONCHIAL NEEDLE ASPIRATION BIOPSY  12/15/2020   Procedure: BRONCHIAL NEEDLE ASPIRATION BIOPSIES;  Surgeon: Leslye Peer, MD;  Location: [facility] ENDOSCOPY;  Service: Cardiopulmonary;;   CHOLECYSTECTOMY  04/10/2013   CHOLECYSTECTOMY N/A 04/10/2013   Procedure: LAPAROSCOPIC CHOLECYSTECTOMY WITH INTRAOPERATIVE CHOLANGIOGRAM;  Surgeon: Adolph Pollack, MD;  [facility] OR;  Service: General;  Laterality: N/A;   ELECTROMAGNETIC NAVIGATION BROCHOSCOPY  12/15/2020   Procedure: ELECTROMAGNETIC NAVIGATION BRONCHOSCOPY;  Surgeon: Leslye Peer, MD;  Location: [facility] ENDOSCOPY;  Service: Cardiopulmonary;;   ESOPHAGOGASTRODUODENOSCOPY (EGD) WITH PROPOFOL N/A 02/26/2021   Procedure: ESOPHAGOGASTRODUODENOSCOPY (EGD) WITH PROPOFOL;  Surgeon: Rachael Fee, MD;  Location: [facility] ENDOSCOPY;  Service: Endoscopy;  Laterality: N/A;   EUS N/A 02/16/2013   Procedure: UPPER ENDOSCOPIC ULTRASOUND (EUS)  LINEAR;  Surgeon: Rachael Fee, MD;  Location: [facility] ENDOSCOPY;  Service: Endoscopy;  Laterality: N/A;  radial linear   IR IMAGING GUIDED PORT INSERTION  06/30/2022   KNEE ARTHROSCOPY Right 1980's   "cartilage OR"   LAPAROSCOPIC ENDOMETRIOSIS FULGURATION  1980's   MYRINGOTOMY WITH TUBE PLACEMENT Right 07/13/2018   Procedure: MYRINGOTOMY WITH TUBE PLACEMENT;  Surgeon: Vernie Murders, MD;  Location: [facility];  Service: ENT;  Laterality: Right;   MYRINGOTOMY WITH TUBE PLACEMENT Right 06/04/2021   Procedure: MYRINGOTOMY WITH BUTTERFLY TUBE PLACEMENT;  Surgeon: Vernie Murders, MD;  Location: [facility];  Service: ENT;  Laterality: Right;   NASOPHARYNGOSCOPY EUSTATION TUBE BALLOON DILATION Right 07/13/2018   Procedure: NASOPHARYNGOSCOPY EUSTATION TUBE BALLOON DILATION;  Surgeon: Vernie Murders, MD;  Location: [facility]TR;  Service: ENT;  Laterality: Right;   TONSILLECTOMY AND ADENOIDECTOMY  ~ 1980   adenoidectomy   TUBAL LIGATION  ~ 1987   TURBINATE REDUCTION Right 07/13/2018   Procedure: OUTFRACTURE TURBINATE;  Surgeon: Vernie Murders, MD;  Location: [facility]ERY [facility]TR;  Service: ENT;  Laterality: Right;   VIDEO BRONCHOSCOPY WITH ENDOBRONCHIAL ULTRASOUND N/A 12/15/2020   Procedure: ROBOTIC VIDEO BRONCHOSCOPY WITH ENDOBRONCHIAL ULTRASOUND;  Surgeon: Leslye Peer, MD;  Location: [facility] ENDOSCOPY;  Service: Cardiopulmonary;  Laterality: N/A;   WRIST SURGERY Left    w/plate    REVIEW OF SYSTEMS:  Constitutional: positive for fatigue Eyes: negative Ears, nose, mouth, throat, and face: negative Respiratory: positive for cough Cardiovascular: negative Gastrointestinal: negative Genitourinary:negative Integument/breast: negative Hematologic/lymphatic: negative Musculoskeletal:negative Neurological: negative Behavioral/Psych: negative Endocrine: negative Allergic/Immunologic: negative   PHYSICAL EXAMINATION: General appearance: alert, cooperative, fatigued, and no  distress Head: Normocephalic, without obvious abnormality, atraumatic Neck: moderate anterior cervical adenopathy, no JVD, supple, symmetrical, trachea midline, and thyroid not enlarged, symmetric, no tenderness/mass/nodules Lymph nodes: Cervical, supraclavicular, and axillary nodes normal. Resp: clear to auscultation bilaterally Back: symmetric, no curvature. ROM normal. No CVA tenderness. Cardio: regular rate and rhythm, S1, S2 normal, no murmur, click, rub or gallop GI: soft, non-tender; bowel sounds normal; no masses,  no organomegaly Extremities: extremities normal, atraumatic, no cyanosis or edema Neurologic: Alert and oriented X 3, normal strength and tone. Normal symmetric reflexes. Normal coordination and gait  ECOG PERFORMANCE STATUS: 1 - Symptomatic but completely ambulatory  Blood pressure 120/73, pulse 95, temperature 97.8 F (36.6 C), temperature source Oral, resp. rate 16, weight 107 lb 8 oz (48.8 kg), SpO2 100 %.  LABORATORY DATA: Lab Results  Component Value Date   WBC 2.5 (L) 07/07/2022  HGB 8.6 (L) 07/07/2022   HCT 24.8 (L) 07/07/2022   MCV 105.1 (H) 07/07/2022   PLT 80 (L) 07/07/2022      Chemistry      Component Value Date/Time   NA 143 06/30/2022 0832   K 4.3 06/30/2022 0832   CL 109 06/30/2022 0832   CO2 29 06/30/2022 0832   BUN 10 06/30/2022 0832   CREATININE 0.84 06/30/2022 0832      Component Value Date/Time   CALCIUM 9.1 06/30/2022 0832   ALKPHOS 114 06/30/2022 0832   AST 50 (H) 06/30/2022 0832   ALT 96 (H) 06/30/2022 0832   BILITOT 0.5 06/30/2022 0832       RADIOGRAPHIC STUDIES: IR IMAGING GUIDED PORT INSERTION  Result Date: 06/30/2022 INDICATION: 57 year old female with advanced stage lung cancer presenting for Port-A-Cath placement. EXAM: IMPLANTED PORT A CATH PLACEMENT WITH ULTRASOUND AND FLUOROSCOPIC GUIDANCE COMPARISON:  None Available. MEDICATIONS: None. ANESTHESIA/SEDATION: Moderate (conscious) sedation was employed during this  procedure. A total of Versed 2 mg and Fentanyl 100 mcg was administered intravenously. Moderate Sedation Time: 17 minutes. The patient's level of consciousness and vital signs were monitored continuously by radiology nursing throughout the procedure under my direct supervision. CONTRAST:  None FLUOROSCOPY TIME:  6 seconds, 0 mGy COMPLICATIONS: None immediate. PROCEDURE: The procedure, risks, benefits, and alternatives were explained to the patient. Questions regarding the procedure were encouraged and answered. The patient understands and consents to the procedure. The right neck and chest were prepped with chlorhexidine in a sterile fashion, and a sterile drape was applied covering the operative field. Maximum barrier sterile technique with sterile gowns and gloves were used for the procedure. A timeout was performed prior to the initiation of the procedure. Ultrasound was used to examine the jugular vein which was compressible and free of internal echoes. A skin marker was used to demarcate the planned venotomy and port pocket incision sites. Local anesthesia was provided to these sites and the subcutaneous tunnel track with 1% lidocaine with 1:100,000 epinephrine. A small incision was created at the jugular access site and blunt dissection was performed of the subcutaneous tissues. Under ultrasound guidance, the jugular vein was accessed with a 21 ga micropuncture needle and an 0.018" wire was inserted to the superior vena cava. Real-time ultrasound guidance was utilized for vascular access including the acquisition of a permanent ultrasound image documenting patency of the accessed vessel. A 5 Fr micopuncture set was then used, through which a 0.035" Rosen wire was passed under fluoroscopic guidance into the inferior vena cava. An 8 Fr dilator was then placed over the wire. A subcutaneous port pocket was then created along the upper chest wall utilizing a combination of sharp and blunt dissection. The pocket was  irrigated with sterile saline, packed with gauze, and observed for hemorrhage. A single lumen "ISP" sized power injectable port was chosen for placement. The 8 Fr catheter was tunneled from the port pocket site to the venotomy incision. The port was placed in the pocket. The external catheter was trimmed to appropriate length. The dilator was exchanged for an 8 Fr peel-away sheath under fluoroscopic guidance. The catheter was then placed through the sheath and the sheath was removed. Final catheter positioning was confirmed and documented with a fluoroscopic spot radiograph. The port was accessed with a Huber needle, aspirated, and flushed with heparinized saline. The deep dermal layer of the port pocket incision was closed with interrupted 3-0 Vicryl suture. Dermabond was then placed over the port pocket and neck  incisions. The patient tolerated the procedure well without immediate post procedural complication. FINDINGS: After catheter placement, the tip lies within the superior cavoatrial junction. The catheter aspirates and flushes normally and is ready for immediate use. IMPRESSION: Successful placement of a power injectable Port-A-Cath via the right internal jugular vein. The catheter is ready for immediate use. Marliss Coots, MD [facility] Electronically Signed   By: Marliss Coots M.D.   On: 06/30/2022 11:27   CT Chest W Contrast  Result Date: 06/14/2022 CLINICAL DATA:  Staging non-small-cell lung cancer. Original stage T1B, N3, M0. Right upper lobe adenocarcinoma. * Tracking Code: BO * EXAM: CT CHEST, ABDOMEN, AND PELVIS WITH CONTRAST TECHNIQUE: Multidetector CT imaging of the chest, abdomen and pelvis was performed following the standard protocol during bolus administration of intravenous contrast. RADIATION DOSE REDUCTION: This exam was performed according to the departmental dose-optimization program which includes automated exposure control,  adjustment of the mA and/or kV according to patient size and/or use of iterative reconstruction technique. CONTRAST:  80mL OMNIPAQUE IOHEXOL 300 MG/ML SOLN, <See Chart> OMNIPAQUE IOHEXOL 300 MG/ML SOLN COMPARISON:  PET-CT 03/26/2022. Abdomen pelvis CT 04/10/2022. Chest CT 03/16/2022. Older exams as well. FINDINGS: CT CHEST FINDINGS Cardiovascular: The thoracic aorta has a normal course and caliber with mild atherosclerotic plaque and intimal thickening. Coronary artery calcifications are seen. Please correlate for other coronary risk factors. Heart is nonenlarged. No significant pericardial effusion. Mediastinum/Nodes: Small thyroid gland. Patulous esophagus with mild wall thickening along the midthoracic esophagus, similar to previous. Mild mediastinal fat stranding. Several abnormal lymph nodes were seen on the prior examination. Specific lesions will be followed for continuity. Left supraclavicular node today series 3, image 15 measures 9 by 6 mm. On previous study from February 2024 this measured 15 by 9 mm. Right-sided supraclavicular lymph node which measured 11 by 8 mm on the prior, today on series 3, image 21 measures 8 by 4 mm. Precarinal node which previously measured 20 x 11 mm, today on series 3, image 54 measures 17 by 8 mm. Other mediastinal nodes are smaller. There is some ill-defined soft tissue thickening throughout the mediastinum. No clear abnormal lymph node enlargement present in the axillary regions or hilum. Lungs/Pleura: Centrilobular emphysematous lung changes are identified diffusely. No pleural effusion or pneumothorax. There is some linear opacity at the left lung base likely scar or atelectasis. As on the prior there are bandlike opacities involving the right upper lobe with scarring, fibrosis and retraction/volume loss. This extends back to the hilum with some distortion of bronchi and lung tissue. Appearance is similar to the prior examinations. On the PET-CT there is a small area of  enhancement centered in the soft tissue thickening in the right hilum. This is difficult to separate from the adjacent tissue today for measurement but the amount of tissue in this location appears to be decreasing from the prior CT scan. The hilar node is not measurable today in this location. Musculoskeletal: No chest wall mass or suspicious bone lesions identified. CT ABDOMEN PELVIS FINDINGS Hepatobiliary: Diffuse fatty liver infiltration. Previous cholecystectomy. Patent portal vein. The central lesion in the right hepatic lobe that measured 15 x 14 mm on the previous examination, today is faintly visible on series 3, image 144 using general anatomy and small amount of residual heterogeneity measures 7 mm on series 3, image 144. This would be very difficult to see without the prior. No new space-occupying liver lesion. Pancreas: Stable mild dilatation of the pancreatic duct  without pancreatic atrophy. Partial divisum. No pancreatic mass. Spleen: Normal in size without focal abnormality. Adrenals/Urinary Tract: Stable adrenal glands. No enhancing renal mass or collecting system dilatation. There are some small low-attenuation lesions along each kidney, too small to completely characterize but likely small cysts. Bosniak 2 lesions. Exophytic lesion from the anterior aspect of the right kidney on series 13, image 15 measures 10 mm. Hounsfield unit of 46. On delayed Hounsfield unit of fifty-five, not significantly changed when adjusting for technique between the 2 datasets. This is hyperdense on the PET-CT, attenuation correction CT dataset with Hounsfield units of 59. Based on this would favor a proteinaceous or hemorrhagic lesion, Bosniak 2. No specific imaging follow-up. Preserved contours of the urinary bladder. Stomach/Bowel: Large bowel has a normal course and caliber. Few colonic diverticula. There are some subtle areas of wall thickening but the colon is decompressed in these locations and there is no  inflammatory stranding. Surgical changes along the base of the cecum in the appendix is not seen. Moderate debris in the stomach. Vascular/Lymphatic: Scattered vascular calcifications. Normal caliber aorta and IVC the previous abnormal lymph node near the porta hepatis and adjacent to the celiac axis appears smaller today. Example which previously measured 17 by 22 mm on the prior, today on series 3, image 146 measures 10 by 9 mm. Again other nodes in this location are smaller including nodes in the portacaval space. No new lymph node enlargement. Reproductive: Status post hysterectomy. No adnexal masses. Other: No abdominal wall hernia or abnormality. No abdominopelvic ascites. Musculoskeletal: Scattered degenerative changes of the spine and pelvis. IMPRESSION: Overall improvement in areas of previous disease. Decreasing mediastinal, supraclavicular, upper abdominal and right hilar lymph nodes. Decreasing right hepatic lobe liver lesion. No new liver lesions identified. Stable posttreatment changes involving the right lung. No new lesion identified. Fatty liver infiltration. Colonic diverticula. Benign appearing renal cystic lesions. Electronically Signed   By: Karen Kays M.D.   On: 06/14/2022 15:09   CT Abdomen Pelvis W Contrast  Result Date: 06/14/2022 CLINICAL DATA:  Staging non-small-cell lung cancer. Original stage T1B, N3, M0. Right upper lobe adenocarcinoma. * Tracking Code: BO * EXAM: CT CHEST, ABDOMEN, AND PELVIS WITH CONTRAST TECHNIQUE: Multidetector CT imaging of the chest, abdomen and pelvis was performed following the standard protocol during bolus administration of intravenous contrast. RADIATION DOSE REDUCTION: This exam was performed according to the departmental dose-optimization program which includes automated exposure control, adjustment of the mA and/or kV according to patient size and/or use of iterative reconstruction technique. CONTRAST:  80mL OMNIPAQUE IOHEXOL 300 MG/ML SOLN, <See  Chart> OMNIPAQUE IOHEXOL 300 MG/ML SOLN COMPARISON:  PET-CT 03/26/2022. Abdomen pelvis CT 04/10/2022. Chest CT 03/16/2022. Older exams as well. FINDINGS: CT CHEST FINDINGS Cardiovascular: The thoracic aorta has a normal course and caliber with mild atherosclerotic plaque and intimal thickening. Coronary artery calcifications are seen. Please correlate for other coronary risk factors. Heart is nonenlarged. No significant pericardial effusion. Mediastinum/Nodes: Small thyroid gland. Patulous esophagus with mild wall thickening along the midthoracic esophagus, similar to previous. Mild mediastinal fat stranding. Several abnormal lymph nodes were seen on the prior examination. Specific lesions will be followed for continuity. Left supraclavicular node today series 3, image 15 measures 9 by 6 mm. On previous study from February 2024 this measured 15 by 9 mm. Right-sided supraclavicular lymph node which measured 11 by 8 mm on the prior, today on series 3, image 21 measures 8 by 4 mm. Precarinal node which previously measured 20 x 11 mm,  today on series 3, image 54 measures 17 by 8 mm. Other mediastinal nodes are smaller. There is some ill-defined soft tissue thickening throughout the mediastinum. No clear abnormal lymph node enlargement present in the axillary regions or hilum. Lungs/Pleura: Centrilobular emphysematous lung changes are identified diffusely. No pleural effusion or pneumothorax. There is some linear opacity at the left lung base likely scar or atelectasis. As on the prior there are bandlike opacities involving the right upper lobe with scarring, fibrosis and retraction/volume loss. This extends back to the hilum with some distortion of bronchi and lung tissue. Appearance is similar to the prior examinations. On the PET-CT there is a small area of enhancement centered in the soft tissue thickening in the right hilum. This is difficult to separate from the adjacent tissue today for measurement but the amount  of tissue in this location appears to be decreasing from the prior CT scan. The hilar node is not measurable today in this location. Musculoskeletal: No chest wall mass or suspicious bone lesions identified. CT ABDOMEN PELVIS FINDINGS Hepatobiliary: Diffuse fatty liver infiltration. Previous cholecystectomy. Patent portal vein. The central lesion in the right hepatic lobe that measured 15 x 14 mm on the previous examination, today is faintly visible on series 3, image 144 using general anatomy and small amount of residual heterogeneity measures 7 mm on series 3, image 144. This would be very difficult to see without the prior. No new space-occupying liver lesion. Pancreas: Stable mild dilatation of the pancreatic duct without pancreatic atrophy. Partial divisum. No pancreatic mass. Spleen: Normal in size without focal abnormality. Adrenals/Urinary Tract: Stable adrenal glands. No enhancing renal mass or collecting system dilatation. There are some small low-attenuation lesions along each kidney, too small to completely characterize but likely small cysts. Bosniak 2 lesions. Exophytic lesion from the anterior aspect of the right kidney on series 13, image 15 measures 10 mm. Hounsfield unit of 46. On delayed Hounsfield unit of fifty-five, not significantly changed when adjusting for technique between the 2 datasets. This is hyperdense on the PET-CT, attenuation correction CT dataset with Hounsfield units of 59. Based on this would favor a proteinaceous or hemorrhagic lesion, Bosniak 2. No specific imaging follow-up. Preserved contours of the urinary bladder. Stomach/Bowel: Large bowel has a normal course and caliber. Few colonic diverticula. There are some subtle areas of wall thickening but the colon is decompressed in these locations and there is no inflammatory stranding. Surgical changes along the base of the cecum in the appendix is not seen. Moderate debris in the stomach. Vascular/Lymphatic: Scattered vascular  calcifications. Normal caliber aorta and IVC the previous abnormal lymph node near the porta hepatis and adjacent to the celiac axis appears smaller today. Example which previously measured 17 by 22 mm on the prior, today on series 3, image 146 measures 10 by 9 mm. Again other nodes in this location are smaller including nodes in the portacaval space. No new lymph node enlargement. Reproductive: Status post hysterectomy. No adnexal masses. Other: No abdominal wall hernia or abnormality. No abdominopelvic ascites. Musculoskeletal: Scattered degenerative changes of the spine and pelvis. IMPRESSION: Overall improvement in areas of previous disease. Decreasing mediastinal, supraclavicular, upper abdominal and right hilar lymph nodes. Decreasing right hepatic lobe liver lesion. No new liver lesions identified. Stable posttreatment changes involving the right lung. No new lesion identified. Fatty liver infiltration. Colonic diverticula. Benign appearing renal cystic lesions. Electronically Signed   By: Karen Kays M.D.   On: 06/14/2022 15:09   CT Soft Tissue Neck  W Contrast  Result Date: 06/14/2022 CLINICAL DATA:  Metastatic non-small cell lung cancer EXAM: CT NECK WITH CONTRAST TECHNIQUE: Multidetector CT imaging of the neck was performed using the standard protocol following the bolus administration of intravenous contrast. RADIATION DOSE REDUCTION: This exam was performed according to the departmental dose-optimization program which includes automated exposure control, adjustment of the mA and/or kV according to patient size and/or use of iterative reconstruction technique. CONTRAST:  80mL OMNIPAQUE IOHEXOL 300 MG/ML SOLN, <See Chart> OMNIPAQUE IOHEXOL 300 MG/ML SOLN COMPARISON:  03/16/2022 and 01/04/2022 CT neck FINDINGS: Pharynx and larynx: Normal. No mass or swelling. Salivary glands: No inflammation, mass, or stone. Thyroid: Normal. Lymph nodes: Redemonstrated enlarged left level 4 lymph node, which measures  up to 8 mm in short axis (series 3, image 79), likely unchanged from the prior exam, which is somewhat limited by motion artifact, but stable to slightly decreased in size compared to 01/04/2022, where it measured up to 9 mm in short axis. Left level 5B lymph node measures up to 7 mm in short axis (series 3, image 74), although it is unclear if this the same lymph node that previously measured up to 10 mm in short axis; this lymph node does not appear enlarged on the December 23 exam. Right level 4 lymph node measures up to 6 mm in short axis (series 3, image 77), likely unchanged given motion on the prior exam and possibly slightly decreased compared to 01/04/2022. Vascular: Patent.  Atherosclerotic calcifications. Limited intracranial: Negative. Visualized orbits: Negative. Mastoids and visualized paranasal sinuses: Mucosal thickening in the ethmoid air cells and maxillary sinuses. Skeleton: No acute osseous abnormality. Degenerative changes in the cervical spine. Upper chest: Please see same-day CT chest Other: None. IMPRESSION: 1. Bilateral level 4 lymph nodes, which appear stable to slightly decreased compared to the prior exam. 2. Left level 5B lymph node measures up to 7 mm in short axis, although it is unclear if this the same lymph node that previously measured up to 10 mm in short axis; this lymph node does not appear enlarged on the December 2023 exam. Attention on follow-up. 3. No other evidence of metastatic disease in the neck. 4. For findings in the thorax, please see same day CT chest. Electronically Signed   By: Wiliam Ke M.D.   On: 06/14/2022 15:00    ASSESSMENT AND PLAN: This is a very pleasant 57 years old white female with stage IIIB (T1b, N3, M0) non-small cell lung cancer, adenocarcinoma diagnosed in November 2022 with no actionable mutation and negative PD-L1 expression. The patient completed a course of concurrent chemoradiation with weekly carboplatin for AUC of 2 and paclitaxel 45  Mg/M2 status post 7 cycles.  She has been tolerating her treatment well except for the mild odynophagia and skin burns. She was also recently admitted to the hospital complaining of dysphagia and odynophagia secondary to radiation induced esophagitis.  She is feeling much better but continues to have residual dysphagia.  She is followed by gastroenterology and was seen by Dr. Christella Hartigan during her hospitalization. Her scan showed improvement of her disease. I recommended for the patient treatment with consolidation immunotherapy with Imfinzi 1500 Mg IV every 4 weeks.  Status post 3 cycles.  Last dose was given in April 2023.  Her treatment was discontinued secondary to suspicious immunotherapy mediated pneumonitis with significant shortness of breath at that time and she was treated with a tapered dose of prednisone. Unfortunately her scan showed evidence for disease recurrence with enlargement of  lower right cervical lymph nodes as well as mediastinal lymphadenopathy.  She has palpable right cervical lymphadenopathy. 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 The patient underwent ultrasound-guided core biopsy of the right supraclavicular lymph nodes but the final pathology showed necrotic tumor cells with complete coagulative necrosis with focal fibrous tissue and histiocytic reaction.  She also had MRI of the brain that showed no evidence of metastatic disease to the brain. The patient was seen by Dr. Roselind Messier and started palliative radiotherapy to the enlarging right supraclavicular lymphadenopathy.  This was completed on October 28, 2021. The patient was found to have metastatic  disease in February 2024 with several lymphadenopathy in the chest as well as supraclavicular, upper abdomen as well as suspicious small liver metastasis. 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  I will send the tissue biopsy to [facility] for molecular studies. She is currently undergoing systemic chemotherapy with carboplatin for AUC of 5, Alimta 500 Mg/M2 and Keytruda 200 Mg IV every 3 weeks status post 4 cycles.  Starting from cycle #3, I reduced her dose of carboplatin to AUC of 4 and Alimta 400 Mg/M2.  Starting from cycle #5 the patient will be on maintenance treatment with Alimta and Keytruda every 3 weeks. Her CBC today showed persistent thrombocytopenia with platelets count of 80,000.  She continues to have mild anemia and leukocytopenia. I recommended for her to delay the start of cycle number 5 x 1 week until recovery of her blood count. For the cough, I will send a prescription for Tessalon to her pharmacy in addition to refill of Emla Cream. She will come back for follow-up visit in 4 weeks for evaluation before starting cycle #6. The patient was advised to call immediately if she has any concerning symptoms in the interval. The patient voices understanding of current disease status and treatment options and is in agreement with the current care plan.  All questions were answered. The patient knows to call the clinic with any problems, questions or concerns. We can certainly see the patient much sooner if necessary.  The total time spent in the appointment was 30 minutes.  Disclaimer: This note was dictated with voice recognition software. Similar sounding words can inadvertently be transcribed and may not be corrected upon review.

## 2022-07-08 ENCOUNTER — Other Ambulatory Visit: Payer: Self-pay

## 2022-07-09 ENCOUNTER — Other Ambulatory Visit: Payer: Self-pay

## 2022-07-09 ENCOUNTER — Telehealth: Payer: Self-pay | Admitting: Internal Medicine

## 2022-07-09 NOTE — Telephone Encounter (Signed)
Called patient regarding June and July appointments, patient is notified.

## 2022-07-13 ENCOUNTER — Telehealth: Payer: Self-pay | Admitting: Emergency Medicine

## 2022-07-13 NOTE — Telephone Encounter (Signed)
Patient would like the nurse to call regarding her refill for her inhalers.  Please call to discuss at [phone]

## 2022-07-14 ENCOUNTER — Other Ambulatory Visit: Payer: Self-pay

## 2022-07-14 ENCOUNTER — Other Ambulatory Visit: Payer: Self-pay | Admitting: *Deleted

## 2022-07-14 ENCOUNTER — Inpatient Hospital Stay: Payer: Commercial Managed Care - PPO

## 2022-07-14 ENCOUNTER — Other Ambulatory Visit: Payer: Commercial Managed Care - PPO

## 2022-07-14 ENCOUNTER — Encounter: Payer: Self-pay | Admitting: Internal Medicine

## 2022-07-14 VITALS — BP 113/66 | HR 99 | Temp 98.1°F | Resp 17 | Wt 106.2 lb

## 2022-07-14 DIAGNOSIS — Z95828 Presence of other vascular implants and grafts: Secondary | ICD-10-CM

## 2022-07-14 DIAGNOSIS — C3491 Malignant neoplasm of unspecified part of right bronchus or lung: Secondary | ICD-10-CM

## 2022-07-14 DIAGNOSIS — Z5112 Encounter for antineoplastic immunotherapy: Secondary | ICD-10-CM | POA: Diagnosis not present

## 2022-07-14 HISTORY — DX: Presence of other vascular implants and grafts: Z95.828

## 2022-07-14 LAB — CBC WITH DIFFERENTIAL (CANCER CENTER ONLY)
Abs Immature Granulocytes: 0.01 10*3/uL (ref 0.00–0.07)
Basophils Absolute: 0 10*3/uL (ref 0.0–0.1)
Basophils Relative: 0 %
Eosinophils Absolute: 0.1 10*3/uL (ref 0.0–0.5)
Eosinophils Relative: 2 %
HCT: 27.8 % — ABNORMAL LOW (ref 36.0–46.0)
Hemoglobin: 9.6 g/dL — ABNORMAL LOW (ref 12.0–15.0)
Immature Granulocytes: 0 %
Lymphocytes Relative: 19 %
Lymphs Abs: 0.6 10*3/uL — ABNORMAL LOW (ref 0.7–4.0)
MCH: 37.2 pg — ABNORMAL HIGH (ref 26.0–34.0)
MCHC: 34.5 g/dL (ref 30.0–36.0)
MCV: 107.8 fL — ABNORMAL HIGH (ref 80.0–100.0)
Monocytes Absolute: 0.4 10*3/uL (ref 0.1–1.0)
Monocytes Relative: 15 %
Neutro Abs: 1.8 10*3/uL (ref 1.7–7.7)
Neutrophils Relative %: 64 %
Platelet Count: 116 10*3/uL — ABNORMAL LOW (ref 150–400)
RBC: 2.58 MIL/uL — ABNORMAL LOW (ref 3.87–5.11)
RDW: 17.2 % — ABNORMAL HIGH (ref 11.5–15.5)
WBC Count: 2.9 10*3/uL — ABNORMAL LOW (ref 4.0–10.5)
nRBC: 0 % (ref 0.0–0.2)

## 2022-07-14 LAB — CMP (CANCER CENTER ONLY)
ALT: 39 U/L (ref 0–44)
AST: 37 U/L (ref 15–41)
Albumin: 3.8 g/dL (ref 3.5–5.0)
Alkaline Phosphatase: 103 U/L (ref 38–126)
Anion gap: 8 (ref 5–15)
BUN: 12 mg/dL (ref 6–20)
CO2: 26 mmol/L (ref 22–32)
Calcium: 9.4 mg/dL (ref 8.9–10.3)
Chloride: 108 mmol/L (ref 98–111)
Creatinine: 0.82 mg/dL (ref 0.44–1.00)
GFR, Estimated: >60 mL/min (ref >60)
Glucose, Bld: 129 mg/dL — ABNORMAL HIGH (ref 70–99)
Potassium: 3.5 mmol/L (ref 3.5–5.1)
Sodium: 142 mmol/L (ref 135–145)
Total Bilirubin: 0.6 mg/dL (ref 0.3–1.2)
Total Protein: 7 g/dL (ref 6.5–8.1)

## 2022-07-14 LAB — SAMPLE TO BLOOD BANK

## 2022-07-14 MED ORDER — SODIUM CHLORIDE 0.9 % IV SOLN
200.0000 mg | Freq: Once | INTRAVENOUS | Status: AC
  Administered 2022-07-14: 200 mg via INTRAVENOUS
  Filled 2022-07-14: qty 200

## 2022-07-14 MED ORDER — HEPARIN SOD (PORK) LOCK FLUSH 100 UNIT/ML IV SOLN
500.0000 [IU] | Freq: Once | INTRAVENOUS | Status: AC | PRN
  Administered 2022-07-14: 500 [IU]

## 2022-07-14 MED ORDER — SODIUM CHLORIDE 0.9 % IV SOLN
400.0000 mg/m2 | Freq: Once | INTRAVENOUS | Status: AC
  Administered 2022-07-14: 600 mg via INTRAVENOUS
  Filled 2022-07-14: qty 20

## 2022-07-14 MED ORDER — PROCHLORPERAZINE MALEATE 10 MG PO TABS
10.0000 mg | ORAL_TABLET | Freq: Once | ORAL | Status: AC
  Administered 2022-07-14: 10 mg via ORAL
  Filled 2022-07-14: qty 1

## 2022-07-14 MED ORDER — STIOLTO RESPIMAT 2.5-2.5 MCG/ACT IN AERS: 2.0000 | INHALATION_SPRAY | Freq: Every day | RESPIRATORY_TRACT | 6 refills | Status: DC

## 2022-07-14 MED ORDER — SODIUM CHLORIDE 0.9% FLUSH
10.0000 mL | Freq: Once | INTRAVENOUS | Status: AC
  Administered 2022-07-14: 10 mL via INTRAVENOUS

## 2022-07-14 MED ORDER — SODIUM CHLORIDE 0.9 % IV SOLN: Freq: Once | INTRAVENOUS | Status: AC

## 2022-07-14 MED ORDER — SODIUM CHLORIDE 0.9% FLUSH
10.0000 mL | INTRAVENOUS | Status: DC | PRN
  Administered 2022-07-14: 10 mL

## 2022-07-14 NOTE — Telephone Encounter (Signed)
Patient has requested refill on Stiolto. I have verified pharmacy. Script has been sent. NFN

## 2022-07-14 NOTE — Patient Instructions (Signed)
[facility]  Discharge Instructions: Thank you for choosing [facility] to provide your oncology and hematology care.   If you have a lab appointment with the Cancer Center, please go directly to the Cancer Center and check in at the registration area.   Wear comfortable clothing and clothing appropriate for easy access to any Portacath or PICC line.   We strive to give you quality time with your provider. You may need to reschedule your appointment if you arrive late (15 or more minutes).  Arriving late affects you and other patients whose appointments are after yours.  Also, if you miss three or more appointments without notifying the office, you may be dismissed from the clinic at the provider's discretion.      For prescription refill requests, have your pharmacy contact our office and allow 72 hours for refills to be completed.    Today you received the following chemotherapy and/or immunotherapy agents: Keytruda, Alimta.       To help prevent nausea and vomiting after your treatment, we encourage you to take your nausea medication as directed.  BELOW ARE SYMPTOMS THAT SHOULD BE REPORTED IMMEDIATELY: *FEVER GREATER THAN 100.4 F (38 C) OR HIGHER *CHILLS OR SWEATING *NAUSEA AND VOMITING THAT IS NOT CONTROLLED WITH YOUR NAUSEA MEDICATION *UNUSUAL SHORTNESS OF BREATH *UNUSUAL BRUISING OR BLEEDING *URINARY PROBLEMS (pain or burning when urinating, or frequent urination) *BOWEL PROBLEMS (unusual diarrhea, constipation, pain near the anus) TENDERNESS IN MOUTH AND THROAT WITH OR WITHOUT PRESENCE OF ULCERS (sore throat, sores in mouth, or a toothache) UNUSUAL RASH, SWELLING OR PAIN  UNUSUAL VAGINAL DISCHARGE OR ITCHING   Items with * indicate a potential emergency and should be followed up as soon as possible or go to the Emergency Department if any problems should occur.  Please show the CHEMOTHERAPY ALERT CARD or IMMUNOTHERAPY ALERT CARD  at check-in to the Emergency Department and triage nurse.  Should you have questions after your visit or need to cancel or reschedule your appointment, please contact [facility]  Dept: [phone]  and follow the prompts.  Office hours are 8:00 a.m. to 4:30 p.m. Monday - Friday. Please note that voicemails left after 4:00 p.m. may not be returned until the following business day.  We are closed weekends and major holidays. You have access to a nurse at all times for urgent questions. Please call the main number to the clinic Dept: [phone] and follow the prompts.   For any non-urgent questions, you may also contact your provider using MyChart. We now offer e-Visits for anyone 18 and older to request care online for non-urgent symptoms. For details visit [email or web address].   Also download the MyChart app! Go to the app store, search "MyChart", open the app, select [facility], and log in with your MyChart username and password.   

## 2022-07-16 ENCOUNTER — Other Ambulatory Visit: Payer: Self-pay | Admitting: Physician Assistant

## 2022-07-16 DIAGNOSIS — C3411 Malignant neoplasm of upper lobe, right bronchus or lung: Secondary | ICD-10-CM

## 2022-07-19 ENCOUNTER — Telehealth: Payer: Self-pay | Admitting: *Deleted

## 2022-07-19 NOTE — Telephone Encounter (Signed)
Julie Nicholson states she has been feeling "puny" the past 2 days, more tired. Is eating and drinking without any problems, no fever. Had treatment last Wednesday. Encouraged her to get plenty of rest and call us if she starts feeling worse or develops a fever.

## 2022-07-22 ENCOUNTER — Other Ambulatory Visit: Payer: Self-pay

## 2022-07-26 ENCOUNTER — Other Ambulatory Visit: Payer: Self-pay

## 2022-07-28 ENCOUNTER — Other Ambulatory Visit: Payer: Commercial Managed Care - PPO

## 2022-07-28 ENCOUNTER — Ambulatory Visit: Payer: Commercial Managed Care - PPO | Admitting: Internal Medicine

## 2022-07-28 ENCOUNTER — Telehealth: Payer: Self-pay | Admitting: Medical Oncology

## 2022-07-28 ENCOUNTER — Ambulatory Visit: Payer: Commercial Managed Care - PPO

## 2022-07-28 NOTE — Telephone Encounter (Signed)
Cough f/u-pt said the albuterol nebulizer worked . She took a treatment 90  mins ago. She is not coughing.I told her to monitor symptoms for now and call back for further concerns.

## 2022-07-28 NOTE — Telephone Encounter (Signed)
Dry , wet non productive cough x 1 week started at same time she lost her voice.  'I feel like something is tickling the back of my throat. I wake up at 3-4 am and have coughing spells . The  Benzonatate capsules don't help. I have been going to my  grandson's  ball games".   Slight SOB, denies fever, chills, chest pain. Over all she feels fine. She is using her inhalers , not her nebulizer. I recommended she take a  nebulizer treatment now and I will talk to [facility] and call her back.

## 2022-07-29 ENCOUNTER — Telehealth: Payer: Self-pay | Admitting: Medical Oncology

## 2022-07-29 ENCOUNTER — Other Ambulatory Visit: Payer: Self-pay

## 2022-07-29 ENCOUNTER — Inpatient Hospital Stay (HOSPITAL_BASED_OUTPATIENT_CLINIC_OR_DEPARTMENT_OTHER): Payer: Commercial Managed Care - PPO | Admitting: Physician Assistant

## 2022-07-29 VITALS — BP 120/64 | HR 97 | Temp 98.0°F | Resp 16 | Wt 106.0 lb

## 2022-07-29 DIAGNOSIS — R591 Generalized enlarged lymph nodes: Secondary | ICD-10-CM | POA: Diagnosis not present

## 2022-07-29 DIAGNOSIS — Z5112 Encounter for antineoplastic immunotherapy: Secondary | ICD-10-CM | POA: Diagnosis not present

## 2022-07-29 DIAGNOSIS — C3491 Malignant neoplasm of unspecified part of right bronchus or lung: Secondary | ICD-10-CM | POA: Diagnosis not present

## 2022-07-29 DIAGNOSIS — Z8709 Personal history of other diseases of the respiratory system: Secondary | ICD-10-CM | POA: Diagnosis not present

## 2022-07-29 LAB — CBC WITH DIFFERENTIAL (CANCER CENTER ONLY)
Abs Immature Granulocytes: 0 10*3/uL (ref 0.00–0.07)
Basophils Absolute: 0 10*3/uL (ref 0.0–0.1)
Basophils Relative: 0 %
Eosinophils Absolute: 0 10*3/uL (ref 0.0–0.5)
Eosinophils Relative: 1 %
HCT: 25.2 % — ABNORMAL LOW (ref 36.0–46.0)
Hemoglobin: 8.6 g/dL — ABNORMAL LOW (ref 12.0–15.0)
Immature Granulocytes: 0 %
Lymphocytes Relative: 20 %
Lymphs Abs: 0.8 10*3/uL (ref 0.7–4.0)
MCH: 37.1 pg — ABNORMAL HIGH (ref 26.0–34.0)
MCHC: 34.1 g/dL (ref 30.0–36.0)
MCV: 108.6 fL — ABNORMAL HIGH (ref 80.0–100.0)
Monocytes Absolute: 0.5 10*3/uL (ref 0.1–1.0)
Monocytes Relative: 12 %
Neutro Abs: 2.9 10*3/uL (ref 1.7–7.7)
Neutrophils Relative %: 67 %
Platelet Count: 149 10*3/uL — ABNORMAL LOW (ref 150–400)
RBC: 2.32 MIL/uL — ABNORMAL LOW (ref 3.87–5.11)
RDW: 15.5 % (ref 11.5–15.5)
WBC Count: 4.2 10*3/uL (ref 4.0–10.5)
nRBC: 0 % (ref 0.0–0.2)

## 2022-07-29 MED ORDER — AMOXICILLIN-POT CLAVULANATE 875-125 MG PO TABS: 1.0000 | ORAL_TABLET | Freq: Two times a day (BID) | ORAL | 0 refills | Status: AC

## 2022-07-29 NOTE — Telephone Encounter (Signed)
New swelling Woke up today with swelling  on left side of neck "size of a golf ball". She said it is like the one she had on her right neck. Per [facility] , Appt with [facility]. Selena Batten has a 9:30 appt in [place] for her hearing aides and can come after.

## 2022-07-29 NOTE — Progress Notes (Signed)
Symptom Management Consult Note [facility]    Patient Care Team: Marva Panda, NP as PCP - General    Name / MRN / DOB: Julie Nicholson  [id number]  07-15-1965   Date of visit: 07/29/2022   Chief Complaint/Reason for visit: cough and neck swelling   Current Therapy: Rande Lawman and alimta  Last treatment:  Day 1   Cycle 5 on 07/14/22   ASSESSMENT & PLAN: Patient is a 57 y.o. female  with oncologic history of recurrent/metastatic non-small cell lung cancer followed by Dr. Arbutus Ped.  I have viewed most recent oncology note and lab work.    #Recurrent/metastatic non-small cell lung cancer  - Next appointment with oncologist is 08/04/22   #Left supraclavicular lymphadenopathy  -Acute onset. Patient with URI symptoms. -On exam she is well appearing. Patient has tender left supraclavicular LAD, right otis media -CBC shows WBC low normal at 4.2, HgB 8.6 with range 8.6-10.8. -Engaged in shared decision making with patient regarding workup and treatment. She prefers trial of antibiotics antibiotics to treat infection. She know if symptoms do not improve with Augmentin a CT scan would be necessary to evaluate for disease progression. -10 day course of Augmentin prescribed.  #COPD -Faint wheezing on exam. No respiratory distress or hypoxia. -As patient is on immunotherapy it is best to avoid steroids when possible and she prefers to hold off for now as she was unaware of the wheezing. -Informed patient to start every 4 hours nebulizer treatments and to use her albuterol inhaler to treat this mild COPD exacerbation   Lengthy discussion about strict ED precautions should symptoms worsen.    Heme/Onc History: Oncology History  Adenocarcinoma of right lung, stage 4 (HCC)  12/18/2020 Initial Diagnosis   Primary adenocarcinoma of upper lobe of right lung (HCC)   01/05/2021 - 02/16/2021 Chemotherapy   Patient is on Treatment Plan : LUNG Carboplatin / Paclitaxel +  XRT q7d     02/09/2021 Cancer Staging   Staging form: Lung, AJCC 8th Edition - Clinical: Stage IIIB (cT1b, cN3, cM0) - Signed by Si Gaul, MD on 02/09/2021   03/25/2021 - 05/20/2021 Chemotherapy   Patient is on Treatment Plan : LUNG NSCLC Durvalumab q28d     04/05/2022 -  Chemotherapy   Patient is on Treatment Plan : LUNG Carboplatin (5) + Pemetrexed (500) + Pembrolizumab (200) D1 q21d Induction x 4 cycles / Maintenance Pemetrexed (500) + Pembrolizumab (200) D1 q21d         Interval history-: Julie Nicholson is a 56 y.o. female with oncologic history as above presenting to [facility] today with chief complaint of cough and neck swelling. She presents unaccompanied to clinic.  Patient states her nonproductive cough has been ongoing x 1 week. She used her nebulizer last night before bed and coughed less overnight compared to previous nights.  She has not noticed any wheezing.  She has been taking Tessalon Perles without improvement.  She also endorses yellow nasal mucus and her throat feels scratchy.  She developed a headache from the cough and took ibuprofen yesterday for it.  Headache has since resolved.  Patient states when she woke up this morning she had swollen lymph node on the left side of her neck.  It is sore to the touch.  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       ROS  All other systems are reviewed and are  negative for acute change except as noted in the HPI.    Allergies  Allergen Reactions   Carboplatin Shortness Of Breath, Itching and Cough    See progress note from 06/16/22     Past Medical History:  Diagnosis Date   Abdominal discomfort    Cancer (HCC)    Chronic headaches    due to allergies, sinus   COPD (chronic obstructive pulmonary disease) (HCC)    per 2012 chest xray   pt states she doesn not have this now (04/10/2013)   Deviated nasal septum    Eustachian tube dysfunction     GERD (gastroesophageal reflux disease)    occasional uses Tums / Rolaids   Hearing loss    right ear   High cholesterol    History of radiation therapy    right lung 01/07/2021-02/19/2021  Dr Antony Blackbird   Migraine    "only once in a blue moon since RX'd allergy shots" (04/10/2013)   Pancreatitis 02/08/2013   Pneumonia    Rhinitis, allergic      Past Surgical History:  Procedure Laterality Date   ABDOMINAL HYSTERECTOMY  1995   tx endometriosis, both ovaries removed   APPENDECTOMY  late 1990's   BIOPSY  02/26/2021   Procedure: BIOPSY;  Surgeon: Rachael Fee, MD;  Location: [facility];  Service: Endoscopy;;   BRONCHIAL BRUSHINGS  12/15/2020   Procedure: BRONCHIAL BRUSHINGS;  Surgeon: Leslye Peer, MD;  Location: [facility] END[facility]COPY;  Service: Cardiopulmonary;;   BRONCHIAL NEEDLE ASPIRATION BIOPSY  12/15/2020   Procedure: BRONCHIAL NEEDLE ASPIRATION BIOPSIES;  Surgeon: Leslye Peer, MD;  Location: [facility] ENDOSCOPY;  Service: Cardiopulmonary;;   CHOLECYSTECTOMY  04/10/2013   CHOLECYSTECTOMY N/A 04/10/2013   Procedure: LAPAROSCOPIC CHOLECYSTECTOMY WITH INTRAOPERATIVE CHOLANGIOGRAM;  Surgeon: Adolph Pollack, MD;  Location: [facility] OR;  Service: General;  Laterality: N/A;   ELECTROMAGNETIC NAVIGATION BROCHOSCOPY  12/15/2020   Procedure: ELECTROMAGNETIC NAVIGATION BRONCHOSCOPY;  Surgeon: Leslye Peer, MD;  Location: [facility] ENDOSCOPY;  Service: Cardiopulmonary;;   ESOPHAGOGASTRODUODENOSCOPY (EGD) WITH PROPOFOL N/A 02/26/2021   Procedure: ESOPHAGOGASTRODUODENOSCOPY (EGD) WITH PROPOFOL;  Surgeon: Rachael Fee, MD;  Location: [facility];  Service: Endoscopy;  Laterality: N/A;   EUS N/A 02/16/2013   Procedure: UPPER ENDOSCOPIC ULTRASOUND (EUS) LINEAR;  Surgeon: Rachael Fee, MD;  Location: [facility];  Service: Endoscopy;  Laterality: N/A;  radial linear   IR IMAGING GUIDED PORT INSERTION  06/30/2022   KNEE ARTHROSCOPY Right 1980's   "cartilage OR"   LAPAROSCOPIC ENDOMETRIOSIS  FULGURATION  1980's   MYRINGOTOMY WITH TUBE PLACEMENT Right 07/13/2018   Procedure: MYRINGOTOMY WITH TUBE PLACEMENT;  Surgeon: Vernie Murders, MD;  Location: [facility];  Service: ENT;  Laterality: Right;   MYRINGOTOMY WITH TUBE PLACEMENT Right 06/04/2021   Procedure: MYRINGOTOMY WITH BUTTERFLY TUBE PLACEMENT;  Surgeon: Vernie Murders, MD;  Location: [facility];  Service: ENT;  Laterality: Right;   NASOPHARYNGOSCOPY EUSTATION TUBE BALLOON DILATION Right 07/13/2018   Procedure: NASOPHARYNGOSCOPY EUSTATION TUBE BALLOON DILATION;  Surgeon: Vernie Murders, MD;  Location: [facility];  Service: ENT;  Laterality: Right;   TONSILLECTOMY AND ADENOIDECTOMY  ~ 1980   adenoidectomy   TUBAL LIGATION  ~ 1987   TURBINATE REDUCTION Right 07/13/2018   Procedure: OUTFRACTURE TURBINATE;  Surgeon: Vernie Murders, MD;  Location: [facility];  Service: ENT;  Laterality: Right;   VIDEO BRONCHOSCOPY WITH ENDOBRONCHIAL ULTRASOUND N/A 12/15/2020   Procedure: ROBOTIC VIDEO BRONCHOSCOPY WITH ENDOBRONCHIAL ULTRASOUND;  Surgeon: Leslye Peer, MD;  Location: [facility]  ENDOSCOPY;  Service: Cardiopulmonary;  Laterality: N/A;   WRIST SURGERY Left    w/plate    Social History   Socioeconomic History   Marital status: Married    Spouse name: Not on file   Number of children: Not on file   Years of education: 12   Highest education level: 12th grade  Occupational History   Not on file  Tobacco Use   Smoking status: Former    Packs/day: 0.50    Years: 41.00    Additional pack years: 0.00    Total pack years: 20.50    Types: Cigarettes    Quit date: 12/23/2020    Years since quitting: 1.5   Smokeless tobacco: Never  Vaping Use   Vaping Use: Never used  Substance and Sexual Activity   Alcohol use: Not Currently    Comment: occasional   Drug use: No   Sexual activity: Yes    Birth control/protection: Surgical    Comment: Hysterectomy  Other Topics Concern   Not on file  Social  History Narrative   Not on file   Social Determinants of Health   Financial Resource Strain: Not on file  Food Insecurity: Not on file  Transportation Needs: Not on file  Physical Activity: Not on file  Stress: Not on file  Social Connections: Not on file  Intimate Partner Violence: Not on file    Family History  Problem Relation Age of Onset   Diabetes Father    Diabetes Mother    COPD Mother    COPD Maternal Grandfather    COPD Maternal Aunt    COPD Maternal Uncle      Current Outpatient Medications:    amoxicillin-clavulanate (AUGMENTIN) 875-125 MG tablet, Take 1 tablet by mouth 2 (two) times daily for 10 days., Disp: 20 tablet, Rfl: 0   acetaminophen (TYLENOL) 500 MG tablet, Take 1,000 mg by mouth every 6 (six) hours as needed for moderate pain., Disp: , Rfl:    albuterol (PROVENTIL) (2.5 MG/3ML) 0.083% nebulizer solution, albuterol sulfate 2.5 mg/3 mL (0.083 %) solution for nebulization  USE 1 VIAL IN NEBULIZER EVERY 6 HOURS AS NEEDED FOR WHEEZING OR SHORTNESS OF BREATH, Disp: , Rfl:    aspirin EC 81 MG tablet, Take 81 mg by mouth at bedtime., Disp: , Rfl:    atorvastatin (LIPITOR) 40 MG tablet, Take 40 mg by mouth at bedtime., Disp: , Rfl:    benzonatate (TESSALON) 200 MG capsule, Take 1 capsule (200 mg total) by mouth 3 (three) times daily as needed., Disp: 30 capsule, Rfl: 0   Biotin 5000 MCG TABS, Take 5,000 mcg by mouth at bedtime., Disp: , Rfl:    buPROPion (WELLBUTRIN XL) 150 MG 24 hr tablet, Take 150 mg by mouth every morning., Disp: , Rfl:    Ca Carbonate-Mag Hydroxide (ROLAIDS PO), Take 1 tablet by mouth daily as needed (heartburn). (Patient not taking: Reported on 05/21/2022), Disp: , Rfl:    famotidine (PEPCID) 40 MG tablet, Take 40 mg by mouth daily., Disp: , Rfl:    fluticasone (FLONASE) 50 MCG/ACT nasal spray, Place 2 sprays into both nostrils daily as needed for allergies., Disp: , Rfl:    folic acid (FOLVITE) 1 MG tablet, Take 1 tablet by mouth once daily,  Disp: 30 tablet, Rfl: 0   hydrocortisone 1 % ointment, Apply 1 Application topically 2 (two) times daily., Disp: 30 g, Rfl: 0   lidocaine-prilocaine (EMLA) cream, Apply to the Port-A-Cath site 30-60 minutes before treatment, Disp: 30  g, Rfl: 0   linaCLOtide (LINZESS PO), Take by mouth. (Patient not taking: Reported on 05/21/2022), Disp: , Rfl:    loratadine (CLARITIN) 10 MG tablet, TAKE 1 TABLET BY MOUTH EVERY DAY, Disp: 90 tablet, Rfl: 1   magic mouthwash SOLN, Take 5 mLs by mouth 4 (four) times daily as needed for mouth pain., Disp: 240 mL, Rfl: 0   Multiple Vitamins-Minerals (HAIR SKIN AND NAILS FORMULA PO), Take 1 tablet by mouth daily., Disp: , Rfl:    ondansetron (ZOFRAN-ODT) 8 MG disintegrating tablet, Take 1 tablet (8 mg total) by mouth 3 (three) times daily as needed., Disp: 20 tablet, Rfl: 1   Oxycodone HCl 10 MG TABS, Take 1 tablet by mouth 3 (three) times daily as needed (pain)., Disp: , Rfl:    PARoxetine (PAXIL) 10 MG tablet, Take 10 mg by mouth daily., Disp: , Rfl:    prochlorperazine (COMPAZINE) 10 MG tablet, Take 1 tablet (10 mg total) by mouth every 6 (six) hours as needed., Disp: 30 tablet, Rfl: 2   rizatriptan (MAXALT-MLT) 10 MG disintegrating tablet, Take 10 mg by mouth as needed for migraine. May repeat in 2 hours if needed, Disp: , Rfl:    sertraline (ZOLOFT) 50 MG tablet, Take 1 tablet by mouth daily., Disp: , Rfl:    Tiotropium Bromide-Olodaterol (STIOLTO RESPIMAT) 2.5-2.5 MCG/ACT AERS, Inhale 2 puffs into the lungs daily., Disp: 4 g, Rfl: 6  PHYSICAL EXAM: ECOG FS:1 - Symptomatic but completely ambulatory    Vitals:   07/29/22 1238  BP: 120/64  Pulse: 97  Resp: 16  Temp: 98 F (36.7 C)  TempSrc: Oral  SpO2: 100%  Weight: 106 lb (48.1 kg)   Physical Exam Vitals and nursing note reviewed.  Constitutional:      Appearance: She is not ill-appearing or toxic-appearing.  HENT:     Head: Normocephalic.     Right Ear: Tympanic membrane is erythematous and bulging.      Left Ear: Tympanic membrane normal.     Nose: Nose normal.     Mouth/Throat:     Pharynx: Oropharynx is clear. Uvula midline. No oropharyngeal exudate, posterior oropharyngeal erythema or uvula swelling.  Eyes:     Conjunctiva/sclera: Conjunctivae normal.  Cardiovascular:     Rate and Rhythm: Normal rate and regular rhythm.     Pulses: Normal pulses.     Heart sounds: Normal heart sounds.  Pulmonary:     Effort: Pulmonary effort is normal.     Comments: Faint expiratory wheezes in posterior upper lung fields. No stridor. No tripoding. Speaking in full sentences. Abdominal:     General: There is no distension.  Musculoskeletal:     Cervical back: Normal range of motion.  Skin:    General: Skin is warm and dry.  Neurological:     Mental Status: She is alert.        LABORATORY DATA: I have reviewed the data as listed    Latest Ref Rng & Units 07/29/2022    1:16 PM 07/14/2022    9:01 AM 07/07/2022    8:30 AM  CBC  WBC 4.0 - 10.5 K/uL 4.2  2.9  2.5   Hemoglobin 12.0 - 15.0 g/dL 8.6  9.6  8.6   Hematocrit 36.0 - 46.0 % 25.2  27.8  24.8   Platelets 150 - 400 K/uL 149  116  80         Latest Ref Rng & Units 07/14/2022    9:01 AM 07/07/2022  8:30 AM 06/30/2022    8:32 AM  CMP  Glucose 70 - 99 mg/dL 469  97  91   BUN 6 - 20 mg/dL 12  12  10    Creatinine 0.44 - 1.00 mg/dL 6.29  5.28  4.13   Sodium 135 - 145 mmol/L 142  142  143   Potassium 3.5 - 5.1 mmol/L 3.5  3.6  4.3   Chloride 98 - 111 mmol/L 108  108  109   CO2 22 - 32 mmol/L 26  26  29    Calcium 8.9 - 10.3 mg/dL 9.4  9.3  9.1   Total Protein 6.5 - 8.1 g/dL 7.0  6.9  6.9   Total Bilirubin 0.3 - 1.2 mg/dL 0.6  0.5  0.5   Alkaline Phos 38 - 126 U/L 103  112  114   AST 15 - 41 U/L 37  25  50   ALT 0 - 44 U/L 39  40  96        RADIOGRAPHIC STUDIES (from last 24 hours if applicable) I have personally reviewed the radiological images as listed and agreed with the findings in the report. No results found.       Visit Diagnosis: 1. Adenocarcinoma of right lung, stage 4 (HCC)   2. Lymphadenopathy   3. History of COPD      Orders Placed This Encounter  Procedures   CBC with Differential (Cancer Center Only)    Standing Status:   Future    Number of Occurrences:   1    Standing Expiration Date:   07/29/2023    All questions were answered. The patient knows to call the clinic with any problems, questions or concerns. No barriers to learning was detected.  A total of more than 30 minutes were spent on this encounter with face-to-face time and non-face-to-face time, including preparing to see the patient, ordering tests and/or medications, counseling the patient and coordination of care as outlined above.    Thank you for allowing me to participate in the care of this patient.    Shanon Ace, PA-C Department of Hematology/Oncology [facility] at [facility] Phone: [phone]  [phone]    07/29/2022 3:09 PM

## 2022-07-31 ENCOUNTER — Other Ambulatory Visit: Payer: Self-pay | Admitting: Internal Medicine

## 2022-08-04 ENCOUNTER — Inpatient Hospital Stay (HOSPITAL_BASED_OUTPATIENT_CLINIC_OR_DEPARTMENT_OTHER): Payer: Commercial Managed Care - PPO | Admitting: Physician Assistant

## 2022-08-04 ENCOUNTER — Inpatient Hospital Stay: Payer: Commercial Managed Care - PPO | Attending: Internal Medicine

## 2022-08-04 ENCOUNTER — Inpatient Hospital Stay: Payer: Commercial Managed Care - PPO

## 2022-08-04 ENCOUNTER — Other Ambulatory Visit: Payer: Self-pay

## 2022-08-04 VITALS — BP 111/62 | HR 99 | Temp 99.9°F | Resp 18 | Wt 107.6 lb

## 2022-08-04 DIAGNOSIS — Z79620 Long term (current) use of immunosuppressive biologic: Secondary | ICD-10-CM | POA: Diagnosis not present

## 2022-08-04 DIAGNOSIS — J441 Chronic obstructive pulmonary disease with (acute) exacerbation: Secondary | ICD-10-CM | POA: Insufficient documentation

## 2022-08-04 DIAGNOSIS — Z95828 Presence of other vascular implants and grafts: Secondary | ICD-10-CM

## 2022-08-04 DIAGNOSIS — C3491 Malignant neoplasm of unspecified part of right bronchus or lung: Secondary | ICD-10-CM

## 2022-08-04 DIAGNOSIS — R590 Localized enlarged lymph nodes: Secondary | ICD-10-CM | POA: Diagnosis not present

## 2022-08-04 DIAGNOSIS — C3411 Malignant neoplasm of upper lobe, right bronchus or lung: Secondary | ICD-10-CM | POA: Insufficient documentation

## 2022-08-04 DIAGNOSIS — Z5112 Encounter for antineoplastic immunotherapy: Secondary | ICD-10-CM | POA: Insufficient documentation

## 2022-08-04 DIAGNOSIS — Z5111 Encounter for antineoplastic chemotherapy: Secondary | ICD-10-CM

## 2022-08-04 DIAGNOSIS — D701 Agranulocytosis secondary to cancer chemotherapy: Secondary | ICD-10-CM

## 2022-08-04 LAB — CBC WITH DIFFERENTIAL (CANCER CENTER ONLY)
Abs Immature Granulocytes: 0.01 10*3/uL (ref 0.00–0.07)
Basophils Absolute: 0 10*3/uL (ref 0.0–0.1)
Basophils Relative: 0 %
Eosinophils Absolute: 0.1 10*3/uL (ref 0.0–0.5)
Eosinophils Relative: 5 %
HCT: 27.5 % — ABNORMAL LOW (ref 36.0–46.0)
Hemoglobin: 9.2 g/dL — ABNORMAL LOW (ref 12.0–15.0)
Immature Granulocytes: 0 %
Lymphocytes Relative: 22 %
Lymphs Abs: 0.7 10*3/uL (ref 0.7–4.0)
MCH: 36.7 pg — ABNORMAL HIGH (ref 26.0–34.0)
MCHC: 33.5 g/dL (ref 30.0–36.0)
MCV: 109.6 fL — ABNORMAL HIGH (ref 80.0–100.0)
Monocytes Absolute: 0.4 10*3/uL (ref 0.1–1.0)
Monocytes Relative: 12 %
Neutro Abs: 1.8 10*3/uL (ref 1.7–7.7)
Neutrophils Relative %: 61 %
Platelet Count: 212 10*3/uL (ref 150–400)
RBC: 2.51 MIL/uL — ABNORMAL LOW (ref 3.87–5.11)
RDW: 14.9 % (ref 11.5–15.5)
WBC Count: 3 10*3/uL — ABNORMAL LOW (ref 4.0–10.5)
nRBC: 0 % (ref 0.0–0.2)

## 2022-08-04 LAB — CMP (CANCER CENTER ONLY)
ALT: 19 U/L (ref 0–44)
AST: 21 U/L (ref 15–41)
Albumin: 3.7 g/dL (ref 3.5–5.0)
Alkaline Phosphatase: 96 U/L (ref 38–126)
Anion gap: 7 (ref 5–15)
BUN: 6 mg/dL (ref 6–20)
CO2: 28 mmol/L (ref 22–32)
Calcium: 9.6 mg/dL (ref 8.9–10.3)
Chloride: 106 mmol/L (ref 98–111)
Creatinine: 0.78 mg/dL (ref 0.44–1.00)
GFR, Estimated: >60 mL/min (ref >60)
Glucose, Bld: 120 mg/dL — ABNORMAL HIGH (ref 70–99)
Potassium: 3.6 mmol/L (ref 3.5–5.1)
Sodium: 141 mmol/L (ref 135–145)
Total Bilirubin: 0.4 mg/dL (ref 0.3–1.2)
Total Protein: 6.5 g/dL (ref 6.5–8.1)

## 2022-08-04 LAB — TSH: TSH: 2.193 u[IU]/mL (ref 0.350–4.500)

## 2022-08-04 LAB — SAMPLE TO BLOOD BANK

## 2022-08-04 MED ORDER — SODIUM CHLORIDE 0.9 % IV SOLN: Freq: Once | INTRAVENOUS | Status: AC

## 2022-08-04 MED ORDER — HEPARIN SOD (PORK) LOCK FLUSH 100 UNIT/ML IV SOLN
500.0000 [IU] | Freq: Once | INTRAVENOUS | Status: AC | PRN
  Administered 2022-08-04: 500 [IU]

## 2022-08-04 MED ORDER — PROCHLORPERAZINE MALEATE 10 MG PO TABS
10.0000 mg | ORAL_TABLET | Freq: Once | ORAL | Status: AC
  Administered 2022-08-04: 10 mg via ORAL
  Filled 2022-08-04: qty 1

## 2022-08-04 MED ORDER — SODIUM CHLORIDE 0.9% FLUSH
10.0000 mL | INTRAVENOUS | Status: DC | PRN
  Administered 2022-08-04: 10 mL

## 2022-08-04 MED ORDER — CYANOCOBALAMIN 1000 MCG/ML IJ SOLN
1000.0000 ug | Freq: Once | INTRAMUSCULAR | Status: AC
  Administered 2022-08-04: 1000 ug via INTRAMUSCULAR
  Filled 2022-08-04: qty 1

## 2022-08-04 MED ORDER — SODIUM CHLORIDE 0.9% FLUSH
10.0000 mL | Freq: Once | INTRAVENOUS | Status: AC
  Administered 2022-08-04: 10 mL

## 2022-08-04 MED ORDER — SODIUM CHLORIDE 0.9 % IV SOLN
400.0000 mg/m2 | Freq: Once | INTRAVENOUS | Status: AC
  Administered 2022-08-04: 600 mg via INTRAVENOUS
  Filled 2022-08-04: qty 20

## 2022-08-04 MED ORDER — SODIUM CHLORIDE 0.9 % IV SOLN
200.0000 mg | Freq: Once | INTRAVENOUS | Status: AC
  Administered 2022-08-04: 200 mg via INTRAVENOUS
  Filled 2022-08-04: qty 200

## 2022-08-04 NOTE — Patient Instructions (Signed)
[facility]  Discharge Instructions: Thank you for choosing [facility] to provide your oncology and hematology care.   If you have a lab appointment with the Cancer Center, please go directly to the Cancer Center and check in at the registration area.   Wear comfortable clothing and clothing appropriate for easy access to any Portacath or PICC line.   We strive to give you quality time with your provider. You may need to reschedule your appointment if you arrive late (15 or more minutes).  Arriving late affects you and other patients whose appointments are after yours.  Also, if you miss three or more appointments without notifying the office, you may be dismissed from the clinic at the provider's discretion.      For prescription refill requests, have your pharmacy contact our office and allow 72 hours for refills to be completed.    Today you received the following chemotherapy and/or immunotherapy agents: Keytruda, Alimta.       To help prevent nausea and vomiting after your treatment, we encourage you to take your nausea medication as directed.  BELOW ARE SYMPTOMS THAT SHOULD BE REPORTED IMMEDIATELY: *FEVER GREATER THAN 100.4 F (38 C) OR HIGHER *CHILLS OR SWEATING *NAUSEA AND VOMITING THAT IS NOT CONTROLLED WITH YOUR NAUSEA MEDICATION *UNUSUAL SHORTNESS OF BREATH *UNUSUAL BRUISING OR BLEEDING *URINARY PROBLEMS (pain or burning when urinating, or frequent urination) *BOWEL PROBLEMS (unusual diarrhea, constipation, pain near the anus) TENDERNESS IN MOUTH AND THROAT WITH OR WITHOUT PRESENCE OF ULCERS (sore throat, sores in mouth, or a toothache) UNUSUAL RASH, SWELLING OR PAIN  UNUSUAL VAGINAL DISCHARGE OR ITCHING   Items with * indicate a potential emergency and should be followed up as soon as possible or go to the Emergency Department if any problems should occur.  Please show the CHEMOTHERAPY ALERT CARD or IMMUNOTHERAPY ALERT CARD  at check-in to the Emergency Department and triage nurse.  Should you have questions after your visit or need to cancel or reschedule your appointment, please contact [facility]  Dept: [phone]  and follow the prompts.  Office hours are 8:00 a.m. to 4:30 p.m. Monday - Friday. Please note that voicemails left after 4:00 p.m. may not be returned until the following business day.  We are closed weekends and major holidays. You have access to a nurse at all times for urgent questions. Please call the main number to the clinic Dept: [phone] and follow the prompts.   For any non-urgent questions, you may also contact your provider using MyChart. We now offer e-Visits for anyone 18 and older to request care online for non-urgent symptoms. For details visit [email or web address].   Also download the MyChart app! Go to the app store, search "MyChart", open the app, select [facility], and log in with your MyChart username and password.   

## 2022-08-04 NOTE — Progress Notes (Signed)
[facility] [phone]   Fax:([phone]  OFFICE PROGRESS NOTE  Julie Panda, NP [address] [place] [address]  DIAGNOSIS: Recurrent/metastatic non-small cell lung cancer initially diagnosed as stage IIIB  (T1b, N3, M0) non-small cell lung cancer, adenocarcinoma she presented with right upper lobe nodule in addition to bulky right hilar, mediastinal, and left supraclavicular lymphadenopathy diagnosed in November 2022.  The patient had evidence for disease recurrence in the mediastinal and right supraclavicular lymphadenopathy in August 2023.  The patient had evidence of metastatic disease in February 2024 with several supraclavicular, thoracic and precarinal lymphadenopathy as well as upper abdominal lymph nodes and small liver lesion.  DETECTED ALTERATION(S) / BIOMARKER(S) % CFDNA OR AMPLIFICATION ASSOCIATED FDA-APPROVED THERAPIES CLINICAL TRIAL AVAILABILITY [id number] ND 0.5 5 50 100 4.7%  RHOAG17E ND 0.5 5 50 100 1.8%  CTNNB1S37C ND 0.5 5 50 100 1.9%  BIOMARKER ADDITIONAL DETAILS Tumor Mutational Burden (TMB) 19.02 mut/Mb MSI Status Stable (MSS) PD-L1 Tumor Proportion Score (TPS)* <1%  Biomarker Findings By [facility] on 05/06/2022 Tumor Mutational Burden - 17 Muts/Mb Microsatellite status - MS-Stable Genomic Findings For a complete list of the genes assayed, please refer to the Appendix. CTNNB1 S37C CRKL amplification MAP2K4 loss exons 2-11 TP53 V143A 8 Disease relevant genes with no reportable alterations: ALK, BRAF, EGFR, ERBB2, KRAS, MET, RET, ROS1  PDL1 0%   PRIOR THERAPY:  1) Concurrent chemoradiation with carboplatin for an AUC of 2 and paclitaxel 45 mg per metered squared.  First dose on 01/05/2021.  Status post 7 cycles of treatment.  Last dose was given February 16, 2021. 2) Consolidation immunotherapy with Imfinzi 1500 Mg IV every 4 weeks.  First dose March 25, 2021.  Status post 3 cycles.  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 3) Palliative radiotherapy to the enlarging right supraclavicular lymphadenopathy under the care of Dr. Roselind Nicholson expected to be completed on October 28, 2021.   CURRENT THERAPY: Palliative systemic chemotherapy with carboplatin for AUC of 5, Alimta 500 Mg/M2 and Keytruda 200 Mg IV every 3 weeks.  First dose April 05, 2022.  Status post 4 cycles.  Starting from cycle #3 her dose of carboplatin was reduced to AUC of 4 and Alimta 400 Mg/M2.  Starting from cycle #5 the patient will be on maintenance treatment with Alimta and Keytruda every 3 weeks.  INTERVAL HISTORY: Julie Nicholson 57 y.o. female returns to the clinic today for follow-up visit before Cycle 6, Day 1 of Alimta and [place]. She is unaccompanied for this visit.   Ms. Julie Nicholson reports her painful cervical adenopathy has resolved after starting Augmentin therapy. She is scheduled to complete the course of antibiotics this coming Sunday. She reports her URI symptoms are resolving. She is still takes nebulizer treatments and uses her inhaler. She reports her cough is mainly in the morning with clear sputum. She denies any fevers and shortness of breath. She reports her energy is stable with mild fatigue. She denies any appetite or weight changes. She reports occasional nausea that improves with her prescribed medications. She denies any bowel habit changes includes including recurrent episodes of diarrhea or constipation. She denies fevers, chills, sweats, shortness of breath, chest pain, headaches, dizziness, peripheral edema or neuropathy. She has no other complaints. Rest of the ROS is below.    MEDICAL HISTORY: Past Medical History:  Diagnosis Date   Abdominal discomfort    Cancer (HCC)    Chronic headaches  due to allergies, sinus   COPD (chronic obstructive pulmonary disease) (HCC)    per 2012 chest xray   pt states she doesn not have this now (04/10/2013)    Deviated nasal septum    Eustachian tube dysfunction    GERD (gastroesophageal reflux disease)    occasional uses Tums / Rolaids   Hearing loss    right ear   High cholesterol    History of radiation therapy    right lung 01/07/2021-02/19/2021  Dr Julie Nicholson   Migraine    "only once in a blue moon since RX'd allergy shots" (04/10/2013)   Pancreatitis 02/08/2013   Pneumonia    Rhinitis, allergic     ALLERGIES:  is allergic to carboplatin.  MEDICATIONS:  Current Outpatient Medications  Medication Sig Dispense Refill   acetaminophen (TYLENOL) 500 MG tablet Take 1,000 mg by mouth every 6 (six) hours as needed for moderate pain.     albuterol (PROVENTIL) (2.5 MG/3ML) 0.083% nebulizer solution albuterol sulfate 2.5 mg/3 mL (0.083 %) solution for nebulization  USE 1 VIAL IN NEBULIZER EVERY 6 HOURS AS NEEDED FOR WHEEZING OR SHORTNESS OF BREATH     amoxicillin-clavulanate (AUGMENTIN) 875-125 MG tablet Take 1 tablet by mouth 2 (two) times daily for 10 days. 20 tablet 0   aspirin EC 81 MG tablet Take 81 mg by mouth at bedtime.     atorvastatin (LIPITOR) 40 MG tablet Take 40 mg by mouth at bedtime.     benzonatate (TESSALON) 200 MG capsule Take 1 capsule (200 mg total) by mouth 3 (three) times daily as needed. 30 capsule 0   Biotin 5000 MCG TABS Take 5,000 mcg by mouth at bedtime.     buPROPion (WELLBUTRIN XL) 150 MG 24 hr tablet Take 150 mg by mouth every morning.     Ca Carbonate-Mag Hydroxide (ROLAIDS PO) Take 1 tablet by mouth daily as needed (heartburn).     famotidine (PEPCID) 40 MG tablet Take 40 mg by mouth daily.     fluticasone (FLONASE) 50 MCG/ACT nasal spray Place 2 sprays into both nostrils daily as needed for allergies.     folic acid (FOLVITE) 1 MG tablet Take 1 tablet by mouth once daily 30 tablet 0   hydrocortisone 1 % ointment Apply 1 Application topically 2 (two) times daily. 30 g 0   lidocaine-prilocaine (EMLA) cream APPLY TO THE PORT-A-CATH SITE 30 TO 60 MINUTES BEFORE  TREATMENT. 30 g 0   linaCLOtide (LINZESS PO) Take by mouth.     loratadine (CLARITIN) 10 MG tablet TAKE 1 TABLET BY MOUTH EVERY DAY 90 tablet 1   magic mouthwash SOLN Take 5 mLs by mouth 4 (four) times daily as needed for mouth pain. 240 mL 0   Multiple Vitamins-Minerals (HAIR SKIN AND NAILS FORMULA PO) Take 1 tablet by mouth daily.     ondansetron (ZOFRAN-ODT) 8 MG disintegrating tablet Take 1 tablet (8 mg total) by mouth 3 (three) times daily as needed. 20 tablet 1   Oxycodone HCl 10 MG TABS Take 1 tablet by mouth 3 (three) times daily as needed (pain).     PARoxetine (PAXIL) 10 MG tablet Take 10 mg by mouth daily.     prochlorperazine (COMPAZINE) 10 MG tablet Take 1 tablet (10 mg total) by mouth every 6 (six) hours as needed. 30 tablet 2   rizatriptan (MAXALT-MLT) 10 MG disintegrating tablet Take 10 mg by mouth as needed for migraine. May repeat in 2 hours if needed     sertraline (  ZOLOFT) 50 MG tablet Take 1 tablet by mouth daily.     Tiotropium Bromide-Olodaterol (STIOLTO RESPIMAT) 2.5-2.5 MCG/ACT AERS Inhale 2 puffs into the lungs daily. 4 g 6   No current facility-administered medications for this visit.    SURGICAL HISTORY:  Past Surgical History:  Procedure Laterality Date   ABDOMINAL HYSTERECTOMY  1995   tx endometriosis, both ovaries removed   APPENDECTOMY  late 1990's   BIOPSY  02/26/2021   Procedure: BIOPSY;  Surgeon: Rachael Fee, MD;  Location: [facility] ENDOSCOPY;  Service: Endoscopy;;   BRONCHIAL BRUSHINGS  12/15/2020   Procedure: BRONCHIAL BRUSHINGS;  Surgeon: Leslye Peer, MD;  Location: [facility] ENDOSCOPY;  Service: Cardiopulmonary;;   BRONCHIAL NEEDLE ASPIRATION BIOPSY  12/15/2020   Procedure: BRONCHIAL NEEDLE ASPIRATION BIOPSIES;  Surgeon: Leslye Peer, MD;  Location: [facility] ENDOSCOPY;  Service: Cardiopulmonary;;   CHOLECYSTECTOMY  04/10/2013   CHOLECYSTECTOMY N/A 04/10/2013   Procedure: LAPAROSCOPIC CHOLECYSTECTOMY WITH INTRAOPERATIVE CHOLANGIOGRAM;  Surgeon: Adolph Pollack, MD;  [facility];  Service: General;  Laterality: N/A;   ELECTROMAGNETIC NAVIGATION BROCHOSCOPY  12/15/2020   Procedure: ELECTROMAGNETIC NAVIGATION BRONCHOSCOPY;  Surgeon: Leslye Peer, MD;  Location[facility] END[facility]COPY;  Service: Cardiopulmonary;;   ESOPHAGOGASTRODUODENOSCOPY (EGD) WITH PROPOFOL N/A 02/26/2021   Procedure: ESOPHAGOGASTRODUODENOSCOPY (EGD) WITH PROPOFOL;  Surgeon: Rachael Fee, MD;  Location: [facility] ENDOSCOPY;  Service: Endoscopy;  Laterality: N/A;   EUS N/A 02/16/2013   Procedure: UPPER ENDOSCOPIC ULTRASOUND (EUS) LINEAR;  Surgeon: Rachael Fee, MD;  Location: [facility] ENDOSCOPY;  Service: Endoscopy;  Laterality: N/A;  radial linear   IR IMAGING GUIDED PORT INSERTION  06/30/2022   KNEE ARTHROSCOPY Right 1980's   "cartilage OR"   LAPAROSCOPIC ENDOMETRIOSIS FULGURATION  1980's   MYRINGOTOMY WITH TUBE PLACEMENT Right 07/13/2018   Procedure: MYRINGOTOMY WITH TUBE PLACEMENT;  Surgeon: Vernie Murders, MD;  Location: [facility] SURGERY CNTR;  Service: ENT;  Laterality: Right;   MYRINGOTOMY WITH TUBE PLACEMENT Right 06/04/2021   Procedure: MYRINGOTOMY WITH BUTTERFLY TUBE PLACEMENT;  Surgeon: Vernie Murders, MD;  Location: [facility]ERY [facility];  Service: ENT;  Laterality: Right;   NASOPHARYNGOSCOPY EUSTATION TUBE BALLOON DILATION Right 07/13/2018   Procedure: NASOPHARYNGOSCOPY EUSTATION TUBE BALLOON DILATION;  Surgeon: Vernie Murders, MD;  Location: [facility] SURGERY [facility]TR;  Service: ENT;  Laterality: Right;   TONSILLECTOMY AND ADENOIDECTOMY  ~ 1980   adenoidectomy   TUBAL LIGATION  ~ 1987   TURBINATE REDUCTION Right 07/13/2018   Procedure: OUTFRACTURE TURBINATE;  Surgeon: Vernie Murders, MD;  Location: [facility] SURGERY CNTR;  Service: ENT;  Laterality: Right;   VIDEO BRONCHOSCOPY WITH ENDOBRONCHIAL ULTRASOUND N/A 12/15/2020   Procedure: ROBOTIC VIDEO BRONCHOSCOPY WITH ENDOBRONCHIAL ULTRASOUND;  Surgeon: Leslye Peer, MD;  Location: [facility] ENDOSCOPY;  Service: Cardiopulmonary;  Laterality: N/A;    WRIST SURGERY Left    w/plate    REVIEW OF SYSTEMS:  Constitutional: positive for fatigue Eyes: negative Ears, nose, mouth, throat, and face: negative Respiratory: positive for cough Cardiovascular: negative Gastrointestinal: negative Genitourinary:negative Integument/breast: negative Hematologic/lymphatic: negative Musculoskeletal:negative Neurological: negative Behavioral/Psych: negative Endocrine: negative Allergic/Immunologic: negative    ECOG PERFORMANCE STATUS: 1 - Symptomatic but completely ambulatory  There were no vitals taken for this visit.  Constitutional: Oriented to person, place, and time and well-developed, well-nourished, and in no distress.  HENT:  Head: Normocephalic and atraumatic.  Eyes: Conjunctivae are normal. Right eye exhibits no discharge. Left eye exhibits no discharge. No scleral icterus.  Neck: Normal range of motion. Neck supple.   Cardiovascular: Normal rate, regular rhythm, normal  heart sounds   Pulmonary/Chest: Effort normal with diffuse wheezing bilaterally.  Musculoskeletal: Normal range of motion. Exhibits no edema.  Lymphadenopathy: No cervical adenopathy.  Neurological: Alert and oriented to person, place, and time. Exhibits normal muscle tone. Gait normal. Coordination normal.  Skin: Skin is warm and dry. No rash noted. Not diaphoretic. No erythema. No pallor.  Psychiatric: Mood, memory and judgment normal.   LABORATORY DATA: Lab Results  Component Value Date   WBC 3.0 (L) 08/04/2022   HGB 9.2 (L) 08/04/2022   HCT 27.5 (L) 08/04/2022   MCV 109.6 (H) 08/04/2022   PLT 212 08/04/2022      Chemistry      Component Value Date/Time   NA 141 08/04/2022 1230   K 3.6 08/04/2022 1230   CL 106 08/04/2022 1230   CO2 28 08/04/2022 1230   BUN 6 08/04/2022 1230   CREATININE 0.78 08/04/2022 1230      Component Value Date/Time   CALCIUM 9.6 08/04/2022 1230   ALKPHOS 96 08/04/2022 1230   AST 21 08/04/2022 1230   ALT 19 08/04/2022  1230   BILITOT 0.4 08/04/2022 1230       RADIOGRAPHIC STUDIES: No results found.  ASSESSMENT AND PLAN: This is a very pleasant 57 years old white female with non-small cell lung cancer, adenocarcinoma diagnosed in November 2022 with no actionable mutation and negative PD-L1 expression.  #Metastatic non-small cell lung cancer: --Prior treatment as above.  --Started palliative systemic chemotherapy with carboplatin for an AUC of 5, Alimta 500 mg/m, Keytruda 200 mg IV every 3 weeks, first dose on 04/05/22.  Starting from cycle #3 her dose of carboplatin was reduced to AUC of 4 and Alimta 400 Mg/M2.  Starting from cycle #5 the patient transitioned to maintenance treatment with Alimta and Keytruda every 3 weeks.  PLAN: --Due for Cycle 6, Day 1 of Alimta and Keytruda today --Labs from today were reviewed and adequate for treatment. WBC 3.0, Hgb 9.2, Plt 212, creatinine and LFTs normal.  --Proceed with treatment today without any dose modifications --RTC in 3 weeks with labs and follow up with Dr. Arbutus Ped before Cycle 7, Day 1.   #Left supraclavicular/cervical adenopathy: --Resolved after starting Augmentin therapy, due to finish course this weekend --URI symptoms are improving, still taking nebulizer treatments. --Monitor for now.   #COPD exacerbation: --Currently using nebulizer treatments and albuterol inhaler with improvement.   All questions were answered. The patient knows to call the clinic with any problems, questions or concerns. We can certainly see the patient much sooner if necessary.  I have spent a total of 30 minutes minutes of face-to-face and non-face-to-face time, preparing to see the patient, performing a medically appropriate examination, counseling and educating the patient,documenting clinical information in the electronic health record, and care coordination.   Georga Kaufmann PA-C Dept of Hematology and Oncology [facility] at [facility] Phone:  [phone]

## 2022-08-06 LAB — T4: T4, Total: 8.5 ug/dL (ref 4.5–12.0)

## 2022-08-10 ENCOUNTER — Other Ambulatory Visit: Payer: Self-pay | Admitting: Physician Assistant

## 2022-08-10 DIAGNOSIS — C3411 Malignant neoplasm of upper lobe, right bronchus or lung: Secondary | ICD-10-CM

## 2022-08-17 ENCOUNTER — Other Ambulatory Visit: Payer: Self-pay

## 2022-08-18 ENCOUNTER — Ambulatory Visit: Payer: Commercial Managed Care - PPO

## 2022-08-18 ENCOUNTER — Other Ambulatory Visit: Payer: Commercial Managed Care - PPO

## 2022-08-18 ENCOUNTER — Ambulatory Visit: Payer: Commercial Managed Care - PPO | Admitting: Physician Assistant

## 2022-08-25 ENCOUNTER — Encounter: Payer: Self-pay | Admitting: Medical Oncology

## 2022-08-25 ENCOUNTER — Ambulatory Visit: Payer: Commercial Managed Care - PPO

## 2022-08-25 ENCOUNTER — Other Ambulatory Visit: Payer: Commercial Managed Care - PPO

## 2022-08-25 ENCOUNTER — Ambulatory Visit: Payer: Commercial Managed Care - PPO | Admitting: Physician Assistant

## 2022-08-25 ENCOUNTER — Inpatient Hospital Stay: Payer: Commercial Managed Care - PPO

## 2022-08-25 ENCOUNTER — Inpatient Hospital Stay (HOSPITAL_BASED_OUTPATIENT_CLINIC_OR_DEPARTMENT_OTHER): Payer: Commercial Managed Care - PPO | Admitting: Internal Medicine

## 2022-08-25 ENCOUNTER — Other Ambulatory Visit: Payer: Self-pay

## 2022-08-25 VITALS — BP 103/69 | HR 102 | Temp 97.9°F | Resp 17 | Ht 62.0 in | Wt 106.2 lb

## 2022-08-25 DIAGNOSIS — T451X5A Adverse effect of antineoplastic and immunosuppressive drugs, initial encounter: Secondary | ICD-10-CM

## 2022-08-25 DIAGNOSIS — C3491 Malignant neoplasm of unspecified part of right bronchus or lung: Secondary | ICD-10-CM

## 2022-08-25 DIAGNOSIS — Z5112 Encounter for antineoplastic immunotherapy: Secondary | ICD-10-CM | POA: Diagnosis not present

## 2022-08-25 DIAGNOSIS — C3490 Malignant neoplasm of unspecified part of unspecified bronchus or lung: Secondary | ICD-10-CM

## 2022-08-25 DIAGNOSIS — Z95828 Presence of other vascular implants and grafts: Secondary | ICD-10-CM

## 2022-08-25 LAB — CMP (CANCER CENTER ONLY)
ALT: 61 U/L — ABNORMAL HIGH (ref 0–44)
AST: 47 U/L — ABNORMAL HIGH (ref 15–41)
Albumin: 4.1 g/dL (ref 3.5–5.0)
Alkaline Phosphatase: 109 U/L (ref 38–126)
Anion gap: 8 (ref 5–15)
BUN: 11 mg/dL (ref 6–20)
CO2: 27 mmol/L (ref 22–32)
Calcium: 9.6 mg/dL (ref 8.9–10.3)
Chloride: 107 mmol/L (ref 98–111)
Creatinine: 0.87 mg/dL (ref 0.44–1.00)
GFR, Estimated: >60 mL/min (ref >60)
Glucose, Bld: 106 mg/dL — ABNORMAL HIGH (ref 70–99)
Potassium: 3.5 mmol/L (ref 3.5–5.1)
Sodium: 142 mmol/L (ref 135–145)
Total Bilirubin: 0.6 mg/dL (ref 0.3–1.2)
Total Protein: 6.9 g/dL (ref 6.5–8.1)

## 2022-08-25 LAB — CBC WITH DIFFERENTIAL (CANCER CENTER ONLY)
Abs Immature Granulocytes: 0 10*3/uL (ref 0.00–0.07)
Basophils Absolute: 0 10*3/uL (ref 0.0–0.1)
Basophils Relative: 0 %
Eosinophils Absolute: 0.2 10*3/uL (ref 0.0–0.5)
Eosinophils Relative: 4 %
HCT: 30.7 % — ABNORMAL LOW (ref 36.0–46.0)
Hemoglobin: 10.5 g/dL — ABNORMAL LOW (ref 12.0–15.0)
Immature Granulocytes: 0 %
Lymphocytes Relative: 17 %
Lymphs Abs: 0.6 10*3/uL — ABNORMAL LOW (ref 0.7–4.0)
MCH: 36.7 pg — ABNORMAL HIGH (ref 26.0–34.0)
MCHC: 34.2 g/dL (ref 30.0–36.0)
MCV: 107.3 fL — ABNORMAL HIGH (ref 80.0–100.0)
Monocytes Absolute: 0.4 10*3/uL (ref 0.1–1.0)
Monocytes Relative: 12 %
Neutro Abs: 2.3 10*3/uL (ref 1.7–7.7)
Neutrophils Relative %: 67 %
Platelet Count: 166 10*3/uL (ref 150–400)
RBC: 2.86 MIL/uL — ABNORMAL LOW (ref 3.87–5.11)
RDW: 13.5 % (ref 11.5–15.5)
WBC Count: 3.4 10*3/uL — ABNORMAL LOW (ref 4.0–10.5)
nRBC: 0 % (ref 0.0–0.2)

## 2022-08-25 LAB — SAMPLE TO BLOOD BANK

## 2022-08-25 MED ORDER — SODIUM CHLORIDE 0.9% FLUSH
10.0000 mL | Freq: Once | INTRAVENOUS | Status: AC
  Administered 2022-08-25: 10 mL

## 2022-08-25 MED ORDER — PROCHLORPERAZINE MALEATE 10 MG PO TABS
10.0000 mg | ORAL_TABLET | Freq: Once | ORAL | Status: AC
  Administered 2022-08-25: 10 mg via ORAL

## 2022-08-25 MED ORDER — SODIUM CHLORIDE 0.9 % IV SOLN
200.0000 mg | Freq: Once | INTRAVENOUS | Status: AC
  Administered 2022-08-25: 200 mg via INTRAVENOUS
  Filled 2022-08-25: qty 200

## 2022-08-25 MED ORDER — SODIUM CHLORIDE 0.9 % IV SOLN
400.0000 mg/m2 | Freq: Once | INTRAVENOUS | Status: AC
  Administered 2022-08-25: 600 mg via INTRAVENOUS
  Filled 2022-08-25: qty 20

## 2022-08-25 MED ORDER — SODIUM CHLORIDE 0.9 % IV SOLN: Freq: Once | INTRAVENOUS | Status: AC

## 2022-08-25 NOTE — Progress Notes (Signed)
Patient seen by Dr. Gypsy Balsam are not all within treatment parameters. Pulse = 102 per Dr . Arbutus Ped it is okay to treat pt today with Keytruda and Alimta and pulse of 102.  Labs reviewed: and are within treatment parameters.  Per physician team, patient is ready for treatment and there are NO modifications to the treatment plan.

## 2022-08-25 NOTE — Patient Instructions (Signed)
[facility]  Discharge Instructions: Thank you for choosing [facility] to provide your oncology and hematology care.   If you have a lab appointment with the [facility], please go directly to the [facility] and check in at the registration area.   Wear comfortable clothing and clothing appropriate for easy access to any Portacath or PICC line.   We strive to give you quality time with your provider. You may need to reschedule your appointment if you arrive late (15 or more minutes).  Arriving late affects you and other patients whose appointments are after yours.  Also, if you miss three or more appointments without notifying the office, you may be dismissed from the clinic at the provider's discretion.      For prescription refill requests, have your pharmacy contact our office and allow 72 hours for refills to be completed.    Today you received the following chemotherapy and/or immunotherapy agents: Keytruda, Alimta.       To help prevent nausea and vomiting after your treatment, we encourage you to take your nausea medication as directed.  BELOW ARE SYMPTOMS THAT SHOULD BE REPORTED IMMEDIATELY: *FEVER GREATER THAN 100.4 F (38 C) OR HIGHER *CHILLS OR SWEATING *NAUSEA AND VOMITING THAT IS NOT CONTROLLED WITH YOUR NAUSEA MEDICATION *UNUSUAL SHORTNESS OF BREATH *UNUSUAL BRUISING OR BLEEDING *URINARY PROBLEMS (pain or burning when urinating, or frequent urination) *BOWEL PROBLEMS (unusual diarrhea, constipation, pain near the anus) TENDERNESS IN MOUTH AND THROAT WITH OR WITHOUT PRESENCE OF ULCERS (sore throat, sores in mouth, or a toothache) UNUSUAL RASH, SWELLING OR PAIN  UNUSUAL VAGINAL DISCHARGE OR ITCHING   Items with * indicate a potential emergency and should be followed up as soon as possible or go to the Emergency Department if any problems should occur.  Please show the CHEMOTHERAPY ALERT CARD or IMMUNOTHERAPY ALERT CARD  at check-in to the Emergency Department and triage nurse.  Should you have questions after your visit or need to cancel or reschedule your appointment, please contact [facility]  Dept: [phone]  and follow the prompts.  Office hours are 8:00 a.m. to 4:30 p.m. Monday - Friday. Please note that voicemails left after 4:00 p.m. may not be returned until the following business day.  We are closed weekends and major holidays. You have access to a nurse at all times for urgent questions. Please call the main number to the clinic Dept: [phone] and follow the prompts.   For any non-urgent questions, you may also contact your provider using MyChart. We now offer e-Visits for anyone 34 and older to request care online for non-urgent symptoms. For details visit [email or web address].   Also download the MyChart app! Go to the app store, search "MyChart", open the app, select [facility], and log in with your MyChart username and password.

## 2022-08-25 NOTE — Progress Notes (Signed)
[facility] [phone]   [phone]  OFFICE PROGRESS NOTE  Elie Confer, NP [address] [place] [address]  DIAGNOSIS: Recurrent/metastatic non-small cell lung cancer initially diagnosed as stage IIIB  (T1b, N3, M0) non-small cell lung cancer, adenocarcinoma she presented with right upper lobe nodule in addition to bulky right hilar, mediastinal, and left supraclavicular lymphadenopathy diagnosed in November 2022.  The patient had evidence for disease recurrence in the mediastinal and right supraclavicular lymphadenopathy in August 2023.  The patient had evidence of metastatic disease in February 2024 with several supraclavicular, thoracic and precarinal lymphadenopathy as well as upper abdominal lymph nodes and small liver lesion.  DETECTED ALTERATION(S) / BIOMARKER(S) % CFDNA OR AMPLIFICATION ASSOCIATED FDA-APPROVED THERAPIES CLINICAL TRIAL AVAILABILITY [id number] ND 0.5 5 50 100 4.7%  RHOAG17E ND 0.5 5 50 100 1.8%  CTNNB1S37C ND 0.5 5 50 100 1.9%  BIOMARKER ADDITIONAL DETAILS Tumor Mutational Burden (TMB) 19.02 mut/Mb MSI Status Stable (MSS) PD-L1 Tumor Proportion Score (TPS)* <1%  Biomarker Findings By [facility] on 05/06/2022 Tumor Mutational Burden - 17 Muts/Mb Microsatellite status - MS-Stable Genomic Findings For a complete list of the genes assayed, please refer to the Appendix. CTNNB1 S37C CRKL amplification MAP2K4 loss exons 2-11 TP53 V143A 8 Disease relevant genes with no reportable alterations: ALK, BRAF, EGFR, ERBB2, KRAS, MET, RET, ROS1  PDL1 0%   PRIOR THERAPY:  1) Concurrent chemoradiation with carboplatin for an AUC of 2 and paclitaxel 45 mg per metered squared.  First dose on 01/05/2021.  Status post 7 cycles of treatment.  Last dose was given February 16, 2021. 2) Consolidation immunotherapy with Imfinzi 1500 Mg IV every 4 weeks.  First dose March 25, 2021.  Status post 3 cycles.   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 3) Palliative radiotherapy to the enlarging right supraclavicular lymphadenopathy under the care of Dr. Roselind Messier expected to be completed on October 28, 2021.   CURRENT THERAPY: Palliative systemic chemotherapy with carboplatin for AUC of 5, Alimta 500 Mg/M2 and Keytruda 200 Mg IV every 3 weeks.  First dose April 05, 2022.  Status post 6 cycles.  Starting from cycle #3 her dose of carboplatin was reduced to AUC of 4 and Alimta 400 Mg/M2.  Starting from cycle #5 the patient will be on maintenance treatment with Alimta and Keytruda every 3 weeks.  INTERVAL HISTORY: Julie Nicholson 57 y.o. female returns to the clinic today for follow-up visit.  The patient is feeling fine today with no concerning complaints except for occasional dysphagia and thick mucus.  She denied having any current chest pain, shortness of breath but has mild cough with no hemoptysis.  She has no nausea, vomiting, diarrhea or constipation.  She has no headache or visual changes.  She denied having any fever or chills.  She has no recent weight loss or night sweats.  She also has intermittent low back pain and currently on treatment with oxycodone.  She is here today for evaluation before starting cycle #7 of her treatment.    MEDICAL HISTORY: Past Medical History:  Diagnosis Date   Abdominal discomfort    Cancer (HCC)    Chronic headaches    due to allergies, sinus   COPD (chronic obstructive pulmonary disease) (HCC)    per 2012 chest xray   pt states she doesn not have this now (04/10/2013)   Deviated nasal septum    Eustachian tube dysfunction  GERD (gastroesophageal reflux disease)    occasional uses Tums / Rolaids   Hearing loss    right ear   High cholesterol    History of radiation therapy    right lung 01/07/2021-02/19/2021  Dr Antony Blackbird   Migraine    "only once in a blue moon since RX'd allergy shots" (04/10/2013)   Pancreatitis  02/08/2013   Pneumonia    Rhinitis, allergic     ALLERGIES:  is allergic to carboplatin.  MEDICATIONS:  Current Outpatient Medications  Medication Sig Dispense Refill   acetaminophen (TYLENOL) 500 MG tablet Take 1,000 mg by mouth every 6 (six) hours as needed for moderate pain.     albuterol (PROVENTIL) (2.5 MG/3ML) 0.083% nebulizer solution albuterol sulfate 2.5 mg/3 mL (0.083 %) solution for nebulization  USE 1 VIAL IN NEBULIZER EVERY 6 HOURS AS NEEDED FOR WHEEZING OR SHORTNESS OF BREATH     aspirin EC 81 MG tablet Take 81 mg by mouth at bedtime.     atorvastatin (LIPITOR) 40 MG tablet Take 40 mg by mouth at bedtime.     benzonatate (TESSALON) 200 MG capsule Take 1 capsule (200 mg total) by mouth 3 (three) times daily as needed. 30 capsule 0   Biotin 5000 MCG TABS Take 5,000 mcg by mouth at bedtime.     buPROPion (WELLBUTRIN XL) 150 MG 24 hr tablet Take 150 mg by mouth every morning.     Ca Carbonate-Mag Hydroxide (ROLAIDS PO) Take 1 tablet by mouth daily as needed (heartburn).     famotidine (PEPCID) 40 MG tablet Take 40 mg by mouth daily.     fluticasone (FLONASE) 50 MCG/ACT nasal spray Place 2 sprays into both nostrils daily as needed for allergies.     folic acid (FOLVITE) 1 MG tablet Take 1 tablet by mouth once daily 30 tablet 2   hydrocortisone 1 % ointment Apply 1 Application topically 2 (two) times daily. 30 g 0   lidocaine-prilocaine (EMLA) cream APPLY TO THE PORT-A-CATH SITE 30 TO 60 MINUTES BEFORE TREATMENT. 30 g 0   linaCLOtide (LINZESS PO) Take by mouth.     loratadine (CLARITIN) 10 MG tablet TAKE 1 TABLET BY MOUTH EVERY DAY 90 tablet 1   magic mouthwash SOLN Take 5 mLs by mouth 4 (four) times daily as needed for mouth pain. 240 mL 0   Multiple Vitamins-Minerals (HAIR SKIN AND NAILS FORMULA PO) Take 1 tablet by mouth daily.     ondansetron (ZOFRAN-ODT) 8 MG disintegrating tablet Take 1 tablet (8 mg total) by mouth 3 (three) times daily as needed. 20 tablet 1   Oxycodone  HCl 10 MG TABS Take 1 tablet by mouth 3 (three) times daily as needed (pain).     PARoxetine (PAXIL) 10 MG tablet Take 10 mg by mouth daily.     prochlorperazine (COMPAZINE) 10 MG tablet Take 1 tablet (10 mg total) by mouth every 6 (six) hours as needed. 30 tablet 2   rizatriptan (MAXALT-MLT) 10 MG disintegrating tablet Take 10 mg by mouth as needed for migraine. May repeat in 2 hours if needed     sertraline (ZOLOFT) 50 MG tablet Take 1 tablet by mouth daily.     Tiotropium Bromide-Olodaterol (STIOLTO RESPIMAT) 2.5-2.5 MCG/ACT AERS Inhale 2 puffs into the lungs daily. 4 g 6   No current facility-administered medications for this visit.    SURGICAL HISTORY:  Past Surgical History:  Procedure Laterality Date   ABDOMINAL HYSTERECTOMY  1995   tx endometriosis, both ovaries removed  APPENDECTOMY  late 1990's   BIOPSY  02/26/2021   Procedure: BIOPSY;  Surgeon: Rachael Fee, MD;  Location: [facility];  Service: Endoscopy;;   BRONCHIAL BRUSHINGS  12/15/2020   Procedure: BRONCHIAL BRUSHINGS;  Surgeon: Leslye Peer, MD;  Location: [facility] ENDOSCOPY;  Service: Cardiopulmonary;;   BRONCHIAL NEEDLE ASPIRATION BIOPSY  12/15/2020   Procedure: BRONCHIAL NEEDLE ASPIRATION BIOPSIES;  Surgeon: Leslye Peer, MD;  Location: [facility] ENDOSCOPY;  Service: Cardiopulmonary;;   CHOLECYSTECTOMY  04/10/2013   CHOLECYSTECTOMY N/A 04/10/2013   Procedure: LAPAROSCOPIC CHOLECYSTECTOMY WITH INTRAOPERATIVE CHOLANGIOGRAM;  Surgeon: Adolph Pollack, MD;  Location: [facility] OR;  Service: General;  Laterality: N/A;   ELECTROMAGNETIC NAVIGATION BROCHOSCOPY  12/15/2020   Procedure: ELECTROMAGNETIC NAVIGATION BRONCHOSCOPY;  Surgeon: Leslye Peer, MD;  Location: [facility] ENDOSCOPY;  Service: Cardiopulmonary;;   ESOPHAGOGASTRODUODENOSCOPY (EGD) WITH PROPOFOL N/A 02/26/2021   Procedure: ESOPHAGOGASTRODUODENOSCOPY (EGD) WITH PROPOFOL;  Surgeon: Rachael Fee, MD;  Location: [facility] ENDOSCOPY;  Service: Endoscopy;  Laterality: N/A;   EUS  N/A 02/16/2013   Procedure: UPPER ENDOSCOPIC ULTRASOUND (EUS) LINEAR;  Surgeon: Rachael Fee, MD;  Location: [facility] ENDOSCOPY;  Service: Endoscopy;  Laterality: N/A;  radial linear   IR IMAGING GUIDED PORT INSERTION  06/30/2022   KNEE ARTHROSCOPY Right 1980's   "cartilage OR"   LAPAROSCOPIC ENDOMETRIOSIS FULGURATION  1980's   MYRINGOTOMY WITH TUBE PLACEMENT Right 07/13/2018   Procedure: MYRINGOTOMY WITH TUBE PLACEMENT;  Surgeon: Vernie Murders, MD;  Location: [facility];  Service: ENT;  Laterality: Right;   MYRINGOTOMY WITH TUBE PLACEMENT Right 06/04/2021   Procedure: MYRINGOTOMY WITH BUTTERFLY TUBE PLACEMENT;  Surgeon: Vernie Murders, MD;  Location: [facility];  Service: ENT;  Laterality: Right;   NASOPHARYNGOSCOPY EUSTATION TUBE BALLOON DILATION Right 07/13/2018   Procedure: NASOPHARYNGOSCOPY EUSTATION TUBE BALLOON DILATION;  Surgeon: Vernie Murders, MD;  Location: [facility];  Service: ENT;  Laterality: Right;   TONSILLECTOMY AND ADENOIDECTOMY  ~ 1980   adenoidectomy   TUBAL LIGATION  ~ 1987   TURBINATE REDUCTION Right 07/13/2018   Procedure: OUTFRACTURE TURBINATE;  Surgeon: Vernie Murders, MD;  Location: [facility];  Service: ENT;  Laterality: Right;   VIDEO BRONCHOSCOPY WITH ENDOBRONCHIAL ULTRASOUND N/A 12/15/2020   Procedure: ROBOTIC VIDEO BRONCHOSCOPY WITH ENDOBRONCHIAL ULTRASOUND;  Surgeon: Leslye Peer, MD;  Location: [facility] ENDOSCOPY;  Service: Cardiopulmonary;  Laterality: N/A;   WRIST SURGERY Left    w/plate    REVIEW OF SYSTEMS:  A comprehensive review of systems was negative except for: Respiratory: positive for cough and sputum Gastrointestinal: positive for dysphagia   PHYSICAL EXAMINATION: General appearance: alert, cooperative, and no distress Head: Normocephalic, without obvious abnormality, atraumatic Neck: moderate anterior cervical adenopathy, no JVD, supple, symmetrical, trachea midline, and thyroid not enlarged, symmetric, no  tenderness/mass/nodules Lymph nodes: Cervical, supraclavicular, and axillary nodes normal. Resp: clear to auscultation bilaterally Back: symmetric, no curvature. ROM normal. No CVA tenderness. Cardio: regular rate and rhythm, S1, S2 normal, no murmur, click, rub or gallop GI: soft, non-tender; bowel sounds normal; no masses,  no organomegaly Extremities: extremities normal, atraumatic, no cyanosis or edema  ECOG PERFORMANCE STATUS: 1 - Symptomatic but completely ambulatory  Blood pressure 103/69, pulse (!) 102, temperature 97.9 F (36.6 C), temperature source Oral, resp. rate 17, height 5\' 2"  (1.575 m), weight 106 lb 3.2 oz (48.2 kg), SpO2 100%.  LABORATORY DATA: Lab Results  Component Value Date   WBC 3.4 (L) 08/25/2022   HGB 10.5 (L) 08/25/2022   HCT 30.7 (L) 08/25/2022  MCV 107.3 (H) 08/25/2022   PLT 166 08/25/2022      Chemistry      Component Value Date/Time   NA 141 08/04/2022 1230   K 3.6 08/04/2022 1230   CL 106 08/04/2022 1230   CO2 28 08/04/2022 1230   BUN 6 08/04/2022 1230   CREATININE 0.78 08/04/2022 1230      Component Value Date/Time   CALCIUM 9.6 08/04/2022 1230   ALKPHOS 96 08/04/2022 1230   AST 21 08/04/2022 1230   ALT 19 08/04/2022 1230   BILITOT 0.4 08/04/2022 1230       RADIOGRAPHIC STUDIES: No results found.  ASSESSMENT AND PLAN: This is a very pleasant 57 years old white female with stage IIIB (T1b, N3, M0) non-small cell lung cancer, adenocarcinoma diagnosed in November 2022 with no actionable mutation and negative PD-L1 expression. The patient completed a course of concurrent chemoradiation with weekly carboplatin for AUC of 2 and paclitaxel 45 Mg/M2 status post 7 cycles.  She has been tolerating her treatment well except for the mild odynophagia and skin burns. She was also recently admitted to the hospital complaining of dysphagia and odynophagia secondary to radiation induced esophagitis.  She is feeling much better but continues to have  residual dysphagia.  She is followed by gastroenterology and was seen by Dr. Christella Hartigan during her hospitalization. Her scan showed improvement of her disease. I recommended for the patient treatment with consolidation immunotherapy with Imfinzi 1500 Mg IV every 4 weeks.  Status post 3 cycles.  Last dose was given in April 2023.  Her treatment was discontinued secondary to suspicious immunotherapy mediated pneumonitis with significant shortness of breath at that time and she was treated with a tapered dose of prednisone. Unfortunately her scan showed evidence for disease recurrence with enlargement of lower right cervical lymph nodes as well as mediastinal lymphadenopathy.  She has palpable right cervical lymphadenopathy. 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 The patient underwent ultrasound-guided core biopsy of the right supraclavicular lymph nodes but the final pathology showed necrotic tumor cells with complete coagulative necrosis with focal fibrous tissue and histiocytic reaction.  She also had MRI of the brain that showed no evidence of metastatic disease to the brain. The patient was seen by Dr. Roselind Messier and started palliative radiotherapy to the enlarging right supraclavicular lymphadenopathy.  This was completed on October 28, 2021. The patient was found to have metastatic disease in February 2024 with several lymphadenopathy in the chest as well as supraclavicular, upper abdomen as well as suspicious small liver metastasis. The patient also had ultrasound-guided core biopsy of a left supraclavicular lymph node yesterday and the final pathology was consistent with  metastatic moderate to poorly differentiated adenocarcinoma of the lung primary.  I will send the tissue biopsy to [facility] for molecular studies. She is currently undergoing systemic chemotherapy with carboplatin for AUC of 5, Alimta 500 Mg/M2 and Keytruda 200 Mg IV every 3 weeks status post 4 cycles.  Starting from cycle #3, I reduced her dose of carboplatin to AUC of 4 and Alimta 400 Mg/M2.  Starting from cycle #5 the patient will be on maintenance treatment with Alimta and Keytruda every 3 weeks. The patient has been tolerating this treatment well with no  concerning adverse effects except for mild fatigue. I recommended for her to proceed with cycle #7 today as planned. I will see her back for follow-up visit in 3 weeks for evaluation with repeat CT scan of the chest, abdomen pelvis as well as CT scan of the neck for restaging of her disease. She was advised to call immediately if she has any other concerning symptoms in the interval. The patient voices understanding of current disease status and treatment options and is in agreement with the current care plan.  All questions were answered. The patient knows to call the clinic with any problems, questions or concerns. We can certainly see the patient much sooner if necessary.  The total time spent in the appointment was 20 minutes.  Disclaimer: This note was dictated with voice recognition software. Similar sounding words can inadvertently be transcribed and may not be corrected upon review.

## 2022-08-28 ENCOUNTER — Other Ambulatory Visit: Payer: Self-pay | Admitting: Internal Medicine

## 2022-09-02 ENCOUNTER — Telehealth: Payer: Self-pay

## 2022-09-02 NOTE — Telephone Encounter (Signed)
Patient called requesting clarification on why she needs to drink oral contrast for her upcoming CT scan on 8/12. She reports that she has never had to drink the contrast for previous CT scans. RN spoke with CT department and they state that their new policy is for any patient under 120 lbs, that they drink the oral contrast.  Patient notified of the policy change and confirmed that she will plan to arrive 2 hours early to drink the contrast.

## 2022-09-04 ENCOUNTER — Other Ambulatory Visit: Payer: Self-pay

## 2022-09-06 ENCOUNTER — Other Ambulatory Visit: Payer: Self-pay | Admitting: Emergency Medicine

## 2022-09-06 DIAGNOSIS — J3089 Other allergic rhinitis: Secondary | ICD-10-CM

## 2022-09-13 ENCOUNTER — Ambulatory Visit (HOSPITAL_COMMUNITY)
Admission: RE | Admit: 2022-09-13 | Discharge: 2022-09-13 | Disposition: A | Discharge status: Home / Self Care | Payer: Commercial Managed Care - PPO | Source: Ambulatory Visit | Attending: Internal Medicine | Admitting: Internal Medicine

## 2022-09-13 ENCOUNTER — Encounter (HOSPITAL_COMMUNITY): Payer: Self-pay

## 2022-09-13 DIAGNOSIS — C3490 Malignant neoplasm of unspecified part of unspecified bronchus or lung: Secondary | ICD-10-CM | POA: Diagnosis present

## 2022-09-13 DIAGNOSIS — C3491 Malignant neoplasm of unspecified part of right bronchus or lung: Secondary | ICD-10-CM | POA: Diagnosis present

## 2022-09-13 MED ORDER — IOHEXOL 9 MG/ML PO SOLN
ORAL | Status: AC
  Filled 2022-09-13: qty 1000

## 2022-09-13 MED ORDER — IOHEXOL 300 MG/ML  SOLN
100.0000 mL | Freq: Once | INTRAMUSCULAR | Status: AC | PRN
  Administered 2022-09-13: 100 mL via INTRAVENOUS

## 2022-09-13 MED ORDER — IOHEXOL 9 MG/ML PO SOLN
1000.0000 mL | ORAL | Status: AC
  Administered 2022-09-13: 1000 mL via ORAL

## 2022-09-15 ENCOUNTER — Inpatient Hospital Stay: Payer: Commercial Managed Care - PPO | Attending: Internal Medicine

## 2022-09-15 ENCOUNTER — Inpatient Hospital Stay (HOSPITAL_BASED_OUTPATIENT_CLINIC_OR_DEPARTMENT_OTHER): Payer: Commercial Managed Care - PPO | Admitting: Internal Medicine

## 2022-09-15 ENCOUNTER — Inpatient Hospital Stay: Payer: Commercial Managed Care - PPO

## 2022-09-15 ENCOUNTER — Other Ambulatory Visit: Payer: Self-pay

## 2022-09-15 VITALS — HR 99

## 2022-09-15 DIAGNOSIS — C778 Secondary and unspecified malignant neoplasm of lymph nodes of multiple regions: Secondary | ICD-10-CM | POA: Insufficient documentation

## 2022-09-15 DIAGNOSIS — C3491 Malignant neoplasm of unspecified part of right bronchus or lung: Secondary | ICD-10-CM

## 2022-09-15 DIAGNOSIS — C3411 Malignant neoplasm of upper lobe, right bronchus or lung: Secondary | ICD-10-CM | POA: Insufficient documentation

## 2022-09-15 DIAGNOSIS — D701 Agranulocytosis secondary to cancer chemotherapy: Secondary | ICD-10-CM

## 2022-09-15 DIAGNOSIS — Z5111 Encounter for antineoplastic chemotherapy: Secondary | ICD-10-CM | POA: Insufficient documentation

## 2022-09-15 DIAGNOSIS — Z5112 Encounter for antineoplastic immunotherapy: Secondary | ICD-10-CM | POA: Diagnosis present

## 2022-09-15 DIAGNOSIS — Z95828 Presence of other vascular implants and grafts: Secondary | ICD-10-CM

## 2022-09-15 LAB — CMP (CANCER CENTER ONLY)
ALT: 32 U/L (ref 0–44)
AST: 27 U/L (ref 15–41)
Albumin: 4 g/dL (ref 3.5–5.0)
Alkaline Phosphatase: 128 U/L — ABNORMAL HIGH (ref 38–126)
Anion gap: 6 (ref 5–15)
BUN: 12 mg/dL (ref 6–20)
CO2: 29 mmol/L (ref 22–32)
Calcium: 9.4 mg/dL (ref 8.9–10.3)
Chloride: 104 mmol/L (ref 98–111)
Creatinine: 0.73 mg/dL (ref 0.44–1.00)
GFR, Estimated: >60 mL/min (ref >60)
Glucose, Bld: 116 mg/dL — ABNORMAL HIGH (ref 70–99)
Potassium: 3.7 mmol/L (ref 3.5–5.1)
Sodium: 139 mmol/L (ref 135–145)
Total Bilirubin: 0.4 mg/dL (ref 0.3–1.2)
Total Protein: 7.5 g/dL (ref 6.5–8.1)

## 2022-09-15 LAB — CBC WITH DIFFERENTIAL (CANCER CENTER ONLY)
Abs Immature Granulocytes: 0.01 10*3/uL (ref 0.00–0.07)
Basophils Absolute: 0 10*3/uL (ref 0.0–0.1)
Basophils Relative: 1 %
Eosinophils Absolute: 0.2 10*3/uL (ref 0.0–0.5)
Eosinophils Relative: 5 %
HCT: 30.2 % — ABNORMAL LOW (ref 36.0–46.0)
Hemoglobin: 10.5 g/dL — ABNORMAL LOW (ref 12.0–15.0)
Immature Granulocytes: 0 %
Lymphocytes Relative: 12 %
Lymphs Abs: 0.5 10*3/uL — ABNORMAL LOW (ref 0.7–4.0)
MCH: 36.1 pg — ABNORMAL HIGH (ref 26.0–34.0)
MCHC: 34.8 g/dL (ref 30.0–36.0)
MCV: 103.8 fL — ABNORMAL HIGH (ref 80.0–100.0)
Monocytes Absolute: 0.5 10*3/uL (ref 0.1–1.0)
Monocytes Relative: 12 %
Neutro Abs: 3.1 10*3/uL (ref 1.7–7.7)
Neutrophils Relative %: 70 %
Platelet Count: 194 10*3/uL (ref 150–400)
RBC: 2.91 MIL/uL — ABNORMAL LOW (ref 3.87–5.11)
RDW: 12.9 % (ref 11.5–15.5)
WBC Count: 4.4 10*3/uL (ref 4.0–10.5)
nRBC: 0 % (ref 0.0–0.2)

## 2022-09-15 LAB — SAMPLE TO BLOOD BANK

## 2022-09-15 MED ORDER — HEPARIN SOD (PORK) LOCK FLUSH 100 UNIT/ML IV SOLN
500.0000 [IU] | Freq: Once | INTRAVENOUS | Status: AC | PRN
  Administered 2022-09-15: 500 [IU]

## 2022-09-15 MED ORDER — SODIUM CHLORIDE 0.9 % IV SOLN: Freq: Once | INTRAVENOUS | Status: AC

## 2022-09-15 MED ORDER — SODIUM CHLORIDE 0.9% FLUSH
10.0000 mL | INTRAVENOUS | Status: DC | PRN
  Administered 2022-09-15: 10 mL

## 2022-09-15 MED ORDER — PROCHLORPERAZINE MALEATE 10 MG PO TABS
10.0000 mg | ORAL_TABLET | Freq: Once | ORAL | Status: AC
  Administered 2022-09-15: 10 mg via ORAL
  Filled 2022-09-15: qty 1

## 2022-09-15 MED ORDER — SODIUM CHLORIDE 0.9 % IV SOLN
200.0000 mg | Freq: Once | INTRAVENOUS | Status: AC
  Administered 2022-09-15: 200 mg via INTRAVENOUS
  Filled 2022-09-15: qty 200

## 2022-09-15 MED ORDER — SODIUM CHLORIDE 0.9 % IV SOLN
400.0000 mg/m2 | Freq: Once | INTRAVENOUS | Status: AC
  Administered 2022-09-15: 600 mg via INTRAVENOUS
  Filled 2022-09-15: qty 20

## 2022-09-15 MED ORDER — SODIUM CHLORIDE 0.9% FLUSH
10.0000 mL | Freq: Once | INTRAVENOUS | Status: AC
  Administered 2022-09-15: 10 mL

## 2022-09-15 NOTE — Progress Notes (Signed)
[facility] [phone]   [phone]  OFFICE PROGRESS NOTE  Julie Confer, NP [address] [place] [address]  DIAGNOSIS: Recurrent/metastatic non-small cell lung cancer initially diagnosed as stage IIIB  (T1b, N3, M0) non-small cell lung cancer, adenocarcinoma she presented with right upper lobe nodule in addition to bulky right hilar, mediastinal, and left supraclavicular lymphadenopathy diagnosed in November 2022.  The patient had evidence for disease recurrence in the mediastinal and right supraclavicular lymphadenopathy in August 2023.  The patient had evidence of metastatic disease in February 2024 with several supraclavicular, thoracic and precarinal lymphadenopathy as well as upper abdominal lymph nodes and small liver lesion.  DETECTED ALTERATION(S) / BIOMARKER(S) % CFDNA OR AMPLIFICATION ASSOCIATED FDA-APPROVED THERAPIES CLINICAL TRIAL AVAILABILITY [id number] ND 0.5 5 50 100 4.7%  RHOAG17E ND 0.5 5 50 100 1.8%  [id number] ND 0.5 5 50 100 1.9%  BIOMARKER ADDITIONAL DETAILS Tumor Mutational Burden (TMB) 19.02 mut/Mb MSI Status Stable (MSS) PD-L1 Tumor Proportion Score (TPS)* <1%  Biomarker Findings By [facility] on 05/06/2022 Tumor Mutational Burden - 17 Muts/Mb Microsatellite status - MS-Stable Genomic Findings For a complete list of the genes assayed, please refer to the Appendix. CTNNB1 S37C CRKL amplification MAP2K4 loss exons 2-11 TP53 V143A 8 Disease relevant genes with no reportable alterations: ALK, BRAF, EGFR, ERBB2, KRAS, MET, RET, ROS1  PDL1 0%   PRIOR THERAPY:  1) Concurrent chemoradiation with carboplatin for an AUC of 2 and paclitaxel 45 mg per metered squared.  First dose on 01/05/2021.  Status post 7 cycles of treatment.  Last dose was given February 16, 2021. 2) Consolidation immunotherapy with Imfinzi 1500 Mg IV every 4 weeks.  First dose March 25, 2021.  Status post 3 cycles.   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 3) Palliative radiotherapy to the enlarging right supraclavicular lymphadenopathy under the care of Dr. Roselind Messier expected to be completed on October 28, 2021.   CURRENT THERAPY: Palliative systemic chemotherapy with carboplatin for AUC of 5, Alimta 500 Mg/M2 and Keytruda 200 Mg IV every 3 weeks.  First dose April 05, 2022.  Status post 7 cycles.  Starting from cycle #3 her dose of carboplatin was reduced to AUC of 4 and Alimta 400 Mg/M2.  Starting from cycle #5 the patient will be on maintenance treatment with Alimta and Keytruda every 3 weeks.  INTERVAL HISTORY: Julie Nicholson 57 y.o. female returns to the clinic today for follow-up visit.  The patient is feeling fine today with no concerning complaints except for postnasal drainage and cough especially in the morning.  She denied having any current chest pain but has shortness of breath with exertion and no hemoptysis.  She denied having any fever or chills.  She has no nausea, vomiting, diarrhea or constipation.  She has no headache or visual changes.  She continues to tolerate her systemic chemotherapy fairly well.  She is here today for evaluation with repeat CT scan of the neck, chest, abdomen and pelvis for restaging of her disease.  MEDICAL HISTORY: Past Medical History:  Diagnosis Date   Abdominal discomfort    Cancer (HCC)    Chronic headaches    due to allergies, sinus   COPD (chronic obstructive pulmonary disease) (HCC)    per 2012 chest xray   pt states she doesn not have this now (04/10/2013)   Deviated nasal septum    Eustachian tube dysfunction    GERD (gastroesophageal reflux  disease)    occasional uses Tums / Rolaids   Hearing loss    right ear   High cholesterol    History of radiation therapy    right lung 01/07/2021-02/19/2021  Dr Antony Blackbird   Migraine    "only once in a blue moon since RX'd allergy shots" (04/10/2013)   Pancreatitis  02/08/2013   Pneumonia    Rhinitis, allergic     ALLERGIES:  is allergic to carboplatin.  MEDICATIONS:  Current Outpatient Medications  Medication Sig Dispense Refill   acetaminophen (TYLENOL) 500 MG tablet Take 1,000 mg by mouth every 6 (six) hours as needed for moderate pain.     albuterol (PROVENTIL) (2.5 MG/3ML) 0.083% nebulizer solution albuterol sulfate 2.5 mg/3 mL (0.083 %) solution for nebulization  USE 1 VIAL IN NEBULIZER EVERY 6 HOURS AS NEEDED FOR WHEEZING OR SHORTNESS OF BREATH     aspirin EC 81 MG tablet Take 81 mg by mouth at bedtime.     atorvastatin (LIPITOR) 40 MG tablet Take 40 mg by mouth at bedtime.     benzonatate (TESSALON) 200 MG capsule Take 1 capsule (200 mg total) by mouth 3 (three) times daily as needed. 30 capsule 0   Biotin 5000 MCG TABS Take 5,000 mcg by mouth at bedtime.     buPROPion (WELLBUTRIN XL) 150 MG 24 hr tablet Take 150 mg by mouth every morning.     Ca Carbonate-Mag Hydroxide (ROLAIDS PO) Take 1 tablet by mouth daily as needed (heartburn).     famotidine (PEPCID) 20 MG tablet Take 1 tablet by mouth twice daily 60 tablet 0   famotidine (PEPCID) 40 MG tablet Take 40 mg by mouth daily.     fluticasone (FLONASE) 50 MCG/ACT nasal spray Place 2 sprays into both nostrils daily as needed for allergies.     folic acid (FOLVITE) 1 MG tablet Take 1 tablet by mouth once daily 30 tablet 2   hydrocortisone 1 % ointment Apply 1 Application topically 2 (two) times daily. 30 g 0   lidocaine-prilocaine (EMLA) cream APPLY TO PORT-A-CATH SITE 30 TO 60 MINUTES BEFORE TREATMENT 30 g 0   linaCLOtide (LINZESS PO) Take by mouth.     loratadine (CLARITIN) 10 MG tablet Take 1 tablet by mouth once daily 30 tablet 0   magic mouthwash SOLN Take 5 mLs by mouth 4 (four) times daily as needed for mouth pain. 240 mL 0   Multiple Vitamins-Minerals (HAIR SKIN AND NAILS FORMULA PO) Take 1 tablet by mouth daily.     ondansetron (ZOFRAN-ODT) 8 MG disintegrating tablet Take 1 tablet (8  mg total) by mouth 3 (three) times daily as needed. 20 tablet 1   Oxycodone HCl 10 MG TABS Take 1 tablet by mouth 3 (three) times daily as needed (pain).     PARoxetine (PAXIL) 10 MG tablet Take 10 mg by mouth daily.     prochlorperazine (COMPAZINE) 10 MG tablet Take 1 tablet (10 mg total) by mouth every 6 (six) hours as needed. 30 tablet 2   rizatriptan (MAXALT-MLT) 10 MG disintegrating tablet Take 10 mg by mouth as needed for migraine. May repeat in 2 hours if needed     sertraline (ZOLOFT) 50 MG tablet Take 1 tablet by mouth daily.     Tiotropium Bromide-Olodaterol (STIOLTO RESPIMAT) 2.5-2.5 MCG/ACT AERS Inhale 2 puffs into the lungs daily. 4 g 6   No current facility-administered medications for this visit.    SURGICAL HISTORY:  Past Surgical History:  Procedure Laterality Date  ABDOMINAL HYSTERECTOMY  1995   tx endometriosis, both ovaries removed   APPENDECTOMY  late 1990's   BIOPSY  02/26/2021   Procedure: BIOPSY;  Surgeon: Rachael Fee, MD;  Location: [facility] ENDOSCOPY;  Service: Endoscopy;;   BRONCHIAL BRUSHINGS  12/15/2020   Procedure: BRONCHIAL BRUSHINGS;  Surgeon: Leslye Peer, MD;  Location: [facility] ENDOSCOPY;  Service: Cardiopulmonary;;   BRONCHIAL NEEDLE ASPIRATION BIOPSY  12/15/2020   Procedure: BRONCHIAL NEEDLE ASPIRATION BIOPSIES;  Surgeon: Leslye Peer, MD;  Location: [facility] ENDOSCOPY;  Service: Cardiopulmonary;;   CHOLECYSTECTOMY  04/10/2013   CHOLECYSTECTOMY N/A 04/10/2013   Procedure: LAPAROSCOPIC CHOLECYSTECTOMY WITH INTRAOPERATIVE CHOLANGIOGRAM;  Surgeon: Adolph Pollack, MD;  Location: [facility] OR;  Service: General;  Laterality: N/A;   ELECTROMAGNETIC NAVIGATION BROCHOSCOPY  12/15/2020   Procedure: ELECTROMAGNETIC NAVIGATION BRONCHOSCOPY;  Surgeon: Leslye Peer, MD;  Location: [facility] ENDOSCOPY;  Service: Cardiopulmonary;;   ESOPHAGOGASTRODUODENOSCOPY (EGD) WITH PROPOFOL N/A 02/26/2021   Procedure: ESOPHAGOGASTRODUODENOSCOPY (EGD) WITH PROPOFOL;  Surgeon: Rachael Fee,  MD;  Location: [facility] ENDOSCOPY;  Service: Endoscopy;  Laterality: N/A;   EUS N/A 02/16/2013   Procedure: UPPER ENDOSCOPIC ULTRASOUND (EUS) LINEAR;  Surgeon: Rachael Fee, MD;  Location: [facility] ENDOSCOPY;  Service: Endoscopy;  Laterality: N/A;  radial linear   IR IMAGING GUIDED PORT INSERTION  06/30/2022   KNEE ARTHROSCOPY Right 1980's   "cartilage OR"   LAPAROSCOPIC ENDOMETRIOSIS FULGURATION  1980's   MYRINGOTOMY WITH TUBE PLACEMENT Right 07/13/2018   Procedure: MYRINGOTOMY WITH TUBE PLACEMENT;  Surgeon: Vernie Murders, MD;  Location: [facility];  Service: ENT;  Laterality: Right;   MYRINGOTOMY WITH TUBE PLACEMENT Right 06/04/2021   Procedure: MYRINGOTOMY WITH BUTTERFLY TUBE PLACEMENT;  Surgeon: Vernie Murders, MD;  Location: [facility] SURGERY [facility]TR;  Service: ENT;  Laterality: Right;   NASOPHARYNGOSCOPY EUSTATION TUBE BALLOON DILATION Right 07/13/2018   Procedure: NASOPHARYNGOSCOPY EUSTATION TUBE BALLOON DILATION;  Surgeon: Vernie Murders, MD;  Location: [facility] SURGERY CNTR;  Service: ENT;  Laterality: Right;   TONSILLECTOMY AND ADENOIDECTOMY  ~ 1980   adenoidectomy   TUBAL LIGATION  ~ 1987   TURBINATE REDUCTION Right 07/13/2018   Procedure: OUTFRACTURE TURBINATE;  Surgeon: Vernie Murders, MD;  Location: [facility] SURGERY CNTR;  Service: ENT;  Laterality: Right;   VIDEO BRONCHOSCOPY WITH ENDOBRONCHIAL ULTRASOUND N/A 12/15/2020   Procedure: ROBOTIC VIDEO BRONCHOSCOPY WITH ENDOBRONCHIAL ULTRASOUND;  Surgeon: Leslye Peer, MD;  Location: [facility] ENDOSCOPY;  Service: Cardiopulmonary;  Laterality: N/A;   WRIST SURGERY Left    w/plate    REVIEW OF SYSTEMS:  Constitutional: positive for fatigue Eyes: negative Ears, nose, mouth, throat, and face: negative Respiratory: positive for cough and dyspnea on exertion Cardiovascular: negative Gastrointestinal: negative Genitourinary:negative Integument/breast: negative Hematologic/lymphatic: negative Musculoskeletal:negative Neurological:  negative Behavioral/Psych: negative Endocrine: negative Allergic/Immunologic: negative   PHYSICAL EXAMINATION: General appearance: alert, cooperative, and no distress Head: Normocephalic, without obvious abnormality, atraumatic Neck: moderate anterior cervical adenopathy, no JVD, supple, symmetrical, trachea midline, and thyroid not enlarged, symmetric, no tenderness/mass/nodules Lymph nodes: Cervical, supraclavicular, and axillary nodes normal. Resp: clear to auscultation bilaterally Back: symmetric, no curvature. ROM normal. No CVA tenderness. Cardio: regular rate and rhythm, S1, S2 normal, no murmur, click, rub or gallop GI: soft, non-tender; bowel sounds normal; no masses,  no organomegaly Extremities: extremities normal, atraumatic, no cyanosis or edema Neurologic: Alert and oriented X 3, normal strength and tone. Normal symmetric reflexes. Normal coordination and gait  ECOG PERFORMANCE STATUS: 1 - Symptomatic but completely ambulatory  Blood pressure 105/63, pulse (!) 109, temperature 98.4 F (36.9  C), temperature source Oral, resp. rate 15, height 5\' 2"  (1.575 m), weight 106 lb (48.1 kg), SpO2 98%.  LABORATORY DATA: Lab Results  Component Value Date   WBC 3.4 (L) 08/25/2022   HGB 10.5 (L) 08/25/2022   HCT 30.7 (L) 08/25/2022   MCV 107.3 (H) 08/25/2022   PLT 166 08/25/2022      Chemistry      Component Value Date/Time   NA 142 08/25/2022 0843   K 3.5 08/25/2022 0843   CL 107 08/25/2022 0843   CO2 27 08/25/2022 0843   BUN 11 08/25/2022 0843   CREATININE 0.87 08/25/2022 0843      Component Value Date/Time   CALCIUM 9.6 08/25/2022 0843   ALKPHOS 109 08/25/2022 0843   AST 47 (H) 08/25/2022 0843   ALT 61 (H) 08/25/2022 0843   BILITOT 0.6 08/25/2022 0843       RADIOGRAPHIC STUDIES: CT ABDOMEN PELVIS W CONTRAST  Result Date: 09/15/2022 CLINICAL DATA:  Non-small cell lung cancer. Restaging. * Tracking Code: BO * EXAM: CT CHEST, ABDOMEN, AND PELVIS WITH CONTRAST  TECHNIQUE: Multidetector CT imaging of the chest, abdomen and pelvis was performed following the standard protocol during bolus administration of intravenous contrast. RADIATION DOSE REDUCTION: This exam was performed according to the departmental dose-optimization program which includes automated exposure control, adjustment of the mA and/or kV according to patient size and/or use of iterative reconstruction technique. CONTRAST:  OMNIPAQUE IOHEXOL 300 MG/ML  SOLN COMPARISON:  06/11/2022 FINDINGS: CT CHEST FINDINGS Cardiovascular: Heart size appears within normal limits. No pericardial effusion. Mediastinum/Nodes: Thyroid gland, trachea appear normal. Similar appearance of circumferential wall thickening involving the thoracic esophagus. -Index left supraclavicular lymph node measures 0.7 cm, image 10/2. Formally 0.8 cm. -Right paratracheal lymph node measures 6 mm short axis, image 28/2 formally 0.7 cm. -Juxta esophageal lymph node within the posterior mediastinum measures 0.7 cm, image 44/2. Previously 0.8 cm. -0.9 cm right hilar lymph node is identified, image 30/2. Unchanged from prior exam. Lungs/Pleura: Moderate to severe paraseptal and centrilobular emphysema. Within the right upper lobe there is confluent scarring, fibrosis and architectural distortion extending into the right hilar region. Findings are compatible with changes secondary to external beam radiation. This appears unchanged when compared with the previous exam. Since the previous exam there is been interval development of diffuse micro-nodularity throughout both lower lung zones, right greater than left. This is best illustrated on image 109/3. Most of these nodules measure on the order of 1-2 mm and are nonspecific. For example: -Within the posterolateral right lower lobe there is a 2 mm nodule, image 128/3. -Within the lateral right lower lobe there is a 3 mm lung nodule, image 119/3. Also new from previous exam. -Posterior left lower  lobe micronodule measures 2 mm, image 133/3. Musculoskeletal: No chest wall mass or suspicious bone lesions identified. CT ABDOMEN PELVIS FINDINGS Hepatobiliary: The central lesion within the right lower lobe on previous imaging is not visualized on today's study. There are no new liver lesions. Status post cholecystectomy. Progressive increase caliber of the common bile duct measures 1.4 cm on today's exam. Formally 0.9 cm. No calcified common bile duct stones identified. Pancreas: Similar appearance of main duct dilatation which measures up to 5 mm, image 70/2. No pancreatic inflammation or mass identified. Spleen: Normal in size without focal abnormality. Adrenals/Urinary Tract: Normal adrenal glands. Unchanged appearance of bilateral kidney cysts. The largest arises off the anterior cortex of the right kidney measuring 1.1 cm. No follow-up imaging recommended. No nephrolithiasis  or hydronephrosis identified. Urinary bladder appears normal. Stomach/Bowel: Stomach is within normal limits. No dilated loops of large or small bowel. Surgical clips noted at the base of cecum. The appendix is not visualized. Vascular/Lymphatic: Aortic atherosclerosis. No adenopathy identified within the abdomen or pelvis on today's exam. Index lymph node within the peripancreatic region measures 0.9 cm, image 63/2. Unchanged from previous exam. No new or progressive adenopathy within the abdomen or pelvis. Reproductive: Status post hysterectomy.  No adnexal mass. Other: There is no ascites or focal fluid collections. Musculoskeletal: No acute or significant osseous findings. IMPRESSION: 1. Since the previous exam there has been interval development of diffuse micro-nodularity throughout both lower lung zones, right greater than left. Most of these nodules measure on the order of 1-2 mm and are nonspecific. Imaging findings are favored to represent sequelae of an inflammatory or infectious process. Metastatic disease considered less  favored. Consider short-term interval follow-up with repeat CT of the chest in 1-3 months to confirm resolution. 2. Stable appearance of confluent scarring, fibrosis and architectural distortion within the right upper lobe compatible with changes secondary to external beam radiation. 3. Stable appearance of mediastinal and right hilar lymph nodes, which no longer meet CT criteria for adenopathy. 4. The central lesion within the right lower lobe of the liver on previous imaging is not visualized on today's study. No new liver lesions identified. 5. Progressive increase caliber of the common bile duct measures 1.4 cm on today's exam. Formally 0.9 cm. No calcified common bile duct stones identified. Correlate for any clinical signs or symptoms of biliary obstruction. 6. Similar appearance of main pancreatic duct dilatation which measures up to 5 mm. 7. Aortic Atherosclerosis (ICD10-I70.0) and Emphysema (ICD10-J43.9). Electronically Signed   By: Signa Kell M.D.   On: 09/15/2022 10:46   CT Chest W Contrast  Result Date: 09/15/2022 CLINICAL DATA:  Non-small cell lung cancer. Restaging. * Tracking Code: BO * EXAM: CT CHEST, ABDOMEN, AND PELVIS WITH CONTRAST TECHNIQUE: Multidetector CT imaging of the chest, abdomen and pelvis was performed following the standard protocol during bolus administration of intravenous contrast. RADIATION DOSE REDUCTION: This exam was performed according to the departmental dose-optimization program which includes automated exposure control, adjustment of the mA and/or kV according to patient size and/or use of iterative reconstruction technique. CONTRAST:  OMNIPAQUE IOHEXOL 300 MG/ML  SOLN COMPARISON:  06/11/2022 FINDINGS: CT CHEST FINDINGS Cardiovascular: Heart size appears within normal limits. No pericardial effusion. Mediastinum/Nodes: Thyroid gland, trachea appear normal. Similar appearance of circumferential wall thickening involving the thoracic esophagus. -Index left  supraclavicular lymph node measures 0.7 cm, image 10/2. Formally 0.8 cm. -Right paratracheal lymph node measures 6 mm short axis, image 28/2 formally 0.7 cm. -Juxta esophageal lymph node within the posterior mediastinum measures 0.7 cm, image 44/2. Previously 0.8 cm. -0.9 cm right hilar lymph node is identified, image 30/2. Unchanged from prior exam. Lungs/Pleura: Moderate to severe paraseptal and centrilobular emphysema. Within the right upper lobe there is confluent scarring, fibrosis and architectural distortion extending into the right hilar region. Findings are compatible with changes secondary to external beam radiation. This appears unchanged when compared with the previous exam. Since the previous exam there is been interval development of diffuse micro-nodularity throughout both lower lung zones, right greater than left. This is best illustrated on image 109/3. Most of these nodules measure on the order of 1-2 mm and are nonspecific. For example: -Within the posterolateral right lower lobe there is a 2 mm nodule, image 128/3. -Within the  lateral right lower lobe there is a 3 mm lung nodule, image 119/3. Also new from previous exam. -Posterior left lower lobe micronodule measures 2 mm, image 133/3. Musculoskeletal: No chest wall mass or suspicious bone lesions identified. CT ABDOMEN PELVIS FINDINGS Hepatobiliary: The central lesion within the right lower lobe on previous imaging is not visualized on today's study. There are no new liver lesions. Status post cholecystectomy. Progressive increase caliber of the common bile duct measures 1.4 cm on today's exam. Formally 0.9 cm. No calcified common bile duct stones identified. Pancreas: Similar appearance of main duct dilatation which measures up to 5 mm, image 70/2. No pancreatic inflammation or mass identified. Spleen: Normal in size without focal abnormality. Adrenals/Urinary Tract: Normal adrenal glands. Unchanged appearance of bilateral kidney cysts. The  largest arises off the anterior cortex of the right kidney measuring 1.1 cm. No follow-up imaging recommended. No nephrolithiasis or hydronephrosis identified. Urinary bladder appears normal. Stomach/Bowel: Stomach is within normal limits. No dilated loops of large or small bowel. Surgical clips noted at the base of cecum. The appendix is not visualized. Vascular/Lymphatic: Aortic atherosclerosis. No adenopathy identified within the abdomen or pelvis on today's exam. Index lymph node within the peripancreatic region measures 0.9 cm, image 63/2. Unchanged from previous exam. No new or progressive adenopathy within the abdomen or pelvis. Reproductive: Status post hysterectomy.  No adnexal mass. Other: There is no ascites or focal fluid collections. Musculoskeletal: No acute or significant osseous findings. IMPRESSION: 1. Since the previous exam there has been interval development of diffuse micro-nodularity throughout both lower lung zones, right greater than left. Most of these nodules measure on the order of 1-2 mm and are nonspecific. Imaging findings are favored to represent sequelae of an inflammatory or infectious process. Metastatic disease considered less favored. Consider short-term interval follow-up with repeat CT of the chest in 1-3 months to confirm resolution. 2. Stable appearance of confluent scarring, fibrosis and architectural distortion within the right upper lobe compatible with changes secondary to external beam radiation. 3. Stable appearance of mediastinal and right hilar lymph nodes, which no longer meet CT criteria for adenopathy. 4. The central lesion within the right lower lobe of the liver on previous imaging is not visualized on today's study. No new liver lesions identified. 5. Progressive increase caliber of the common bile duct measures 1.4 cm on today's exam. Formally 0.9 cm. No calcified common bile duct stones identified. Correlate for any clinical signs or symptoms of biliary  obstruction. 6. Similar appearance of main pancreatic duct dilatation which measures up to 5 mm. 7. Aortic Atherosclerosis (ICD10-I70.0) and Emphysema (ICD10-J43.9). Electronically Signed   By: Signa Kell M.D.   On: 09/15/2022 10:46   CT Soft Tissue Neck W Contrast  Result Date: 09/15/2022 CLINICAL DATA:  Neck mass, nonpulsatile. EXAM: CT NECK WITH CONTRAST TECHNIQUE: Multidetector CT imaging of the neck was performed using the standard protocol following the bolus administration of intravenous contrast. RADIATION DOSE REDUCTION: This exam was performed according to the departmental dose-optimization program which includes automated exposure control, adjustment of the mA and/or kV according to patient size and/or use of iterative reconstruction technique. CONTRAST:  OMNIPAQUE IOHEXOL 300 MG/ML  SOLN COMPARISON:  Neck CT 06/11/2022. FINDINGS: Pharynx and larynx: Normal. No mass or swelling. Salivary glands: No inflammation, mass, or stone. Thyroid: Normal. Lymph nodes: Newly enlarged left level 5 lymph node, now measuring up to 13 x 12 mm (axial image 63 series 4), indeterminate. Subcentimeter bilateral level 4 lymph nodes are unchanged.  Vascular: Atherosclerotic calcifications of the carotid bulbs. Limited intracranial: Unremarkable. Visualized orbits: Unremarkable. Mastoids and visualized paranasal sinuses: Well aerated. Skeleton: Mild cervical spondylosis without high-grade spinal canal stenosis. Upper chest: Please refer to contemporaneous CT chest report. Other: None. IMPRESSION: Newly enlarged left level 5 lymph node, now measuring up to 13 x 12 mm, indeterminate and possibly reactive given other cervical lymph nodes are unchanged. Attention on follow-up. Electronically Signed   By: Orvan Falconer M.D.   On: 09/15/2022 10:08    ASSESSMENT AND PLAN: This is a very pleasant 57 years old white female with stage IIIB (T1b, N3, M0) non-small cell lung cancer, adenocarcinoma diagnosed in November  2022 with no actionable mutation and negative PD-L1 expression. The patient completed a course of concurrent chemoradiation with weekly carboplatin for AUC of 2 and paclitaxel 45 Mg/M2 status post 7 cycles.  She has been tolerating her treatment well except for the mild odynophagia and skin burns. She was also recently admitted to the hospital complaining of dysphagia and odynophagia secondary to radiation induced esophagitis.  She is feeling much better but continues to have residual dysphagia.  She is followed by gastroenterology and was seen by Dr. Christella Hartigan during her hospitalization. Her scan showed improvement of her disease. I recommended for the patient treatment with consolidation immunotherapy with Imfinzi 1500 Mg IV every 4 weeks.  Status post 3 cycles.  Last dose was given in April 2023.  Her treatment was discontinued secondary to suspicious immunotherapy mediated pneumonitis with significant shortness of breath at that time and she was treated with a tapered dose of prednisone. Unfortunately her scan showed evidence for disease recurrence with enlargement of lower right cervical lymph nodes as well as mediastinal lymphadenopathy.  She has palpable right cervical lymphadenopathy. 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 The patient underwent ultrasound-guided core biopsy of the right supraclavicular lymph nodes but the final pathology showed necrotic tumor cells with complete coagulative necrosis with focal fibrous tissue and histiocytic reaction.  She also had MRI of the brain that showed no evidence of metastatic disease to the brain. The patient was seen by Dr.  Roselind Messier and started palliative radiotherapy to the enlarging right supraclavicular lymphadenopathy.  This was completed on October 28, 2021. The patient was found to have metastatic disease in February 2024 with several lymphadenopathy in the chest as well as supraclavicular, upper abdomen as well as suspicious small liver metastasis. 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  I will send the tissue biopsy to [facility] for molecular studies. She is currently undergoing systemic chemotherapy with carboplatin for AUC of 5, Alimta 500 Mg/M2 and Keytruda 200 Mg IV every 3 weeks status post 7 cycles.  Starting from cycle #3, I reduced her dose of carboplatin to AUC of 4 and Alimta 400 Mg/M2.  Starting from cycle #5 the patient will be on maintenance treatment with Alimta and Keytruda every 3 weeks. The patient has been tolerating this treatment well with no concerning adverse effects. She had repeat CT scan of the neck, chest, abdomen and pelvis performed recently. I personally and independently reviewed  the scan images and discussed the result with the patient today. Her scan showed no concerning findings for disease progression in the chest, abdomen and pelvis but there was newly enlarged left level 5 lymph node measuring 1.3 x 1.2 cm that is indeterminant and possibly reactive in nature. I recommended for the patient to continue her current treatment with maintenance Alimta and Keytruda and she will proceed with cycle #8 today. I will see her back for follow-up visit in 3 weeks for evaluation before starting cycle #9. She was advised to call immediately if she has any other concerning symptoms in the interval. The patient voices understanding of current disease status and treatment options and is in agreement with the  current care plan.  All questions were answered. The patient knows to call the clinic with any problems, questions or concerns. We can certainly see the patient much sooner if necessary.  The total time spent in the appointment was 30 minutes.  Disclaimer: This note was dictated with voice recognition software. Similar sounding words can inadvertently be transcribed and may not be corrected upon review.

## 2022-09-15 NOTE — Patient Instructions (Signed)
[facility]  Discharge Instructions: Thank you for choosing [facility] to provide your oncology and hematology care.   If you have a lab appointment with the [facility], please go directly to the [facility] and check in at the registration area.   Wear comfortable clothing and clothing appropriate for easy access to any Portacath or PICC line.   We strive to give you quality time with your provider. You may need to reschedule your appointment if you arrive late (15 or more minutes).  Arriving late affects you and other patients whose appointments are after yours.  Also, if you miss three or more appointments without notifying the office, you may be dismissed from the clinic at the provider's discretion.      For prescription refill requests, have your pharmacy contact our office and allow 72 hours for refills to be completed.    Today you received the following chemotherapy and/or immunotherapy agents: Keytruda, Alimta.       To help prevent nausea and vomiting after your treatment, we encourage you to take your nausea medication as directed.  BELOW ARE SYMPTOMS THAT SHOULD BE REPORTED IMMEDIATELY: *FEVER GREATER THAN 100.4 F (38 C) OR HIGHER *CHILLS OR SWEATING *NAUSEA AND VOMITING THAT IS NOT CONTROLLED WITH YOUR NAUSEA MEDICATION *UNUSUAL SHORTNESS OF BREATH *UNUSUAL BRUISING OR BLEEDING *URINARY PROBLEMS (pain or burning when urinating, or frequent urination) *BOWEL PROBLEMS (unusual diarrhea, constipation, pain near the anus) TENDERNESS IN MOUTH AND THROAT WITH OR WITHOUT PRESENCE OF ULCERS (sore throat, sores in mouth, or a toothache) UNUSUAL RASH, SWELLING OR PAIN  UNUSUAL VAGINAL DISCHARGE OR ITCHING   Items with * indicate a potential emergency and should be followed up as soon as possible or go to the Emergency Department if any problems should occur.  Please show the CHEMOTHERAPY ALERT CARD or IMMUNOTHERAPY ALERT CARD  at check-in to the Emergency Department and triage nurse.  Should you have questions after your visit or need to cancel or reschedule your appointment, please contact [facility]  Dept: [phone]  and follow the prompts.  Office hours are 8:00 a.m. to 4:30 p.m. Monday - Friday. Please note that voicemails left after 4:00 p.m. may not be returned until the following business day.  We are closed weekends and major holidays. You have access to a nurse at all times for urgent questions. Please call the main number to the clinic Dept: [phone] and follow the prompts.   For any non-urgent questions, you may also contact your provider using MyChart. We now offer e-Visits for anyone 34 and older to request care online for non-urgent symptoms. For details visit [email or web address].   Also download the MyChart app! Go to the app store, search "MyChart", open the app, select [facility], and log in with your MyChart username and password.

## 2022-09-16 ENCOUNTER — Telehealth: Payer: Self-pay | Admitting: Emergency Medicine

## 2022-09-16 ENCOUNTER — Telehealth: Payer: Self-pay | Admitting: Medical Oncology

## 2022-09-16 NOTE — Telephone Encounter (Signed)
Today her port a cath site is tender when she touches the skin -denies redness , swelling ,  warmth or fever.  I told her to watch it for changes and to call with any worsening symptoms I.e. Pain , redness, swelling, fever.   She was diagnosed with ringworm yesterday on her ankle and is taking a cream to treat it.

## 2022-09-16 NOTE — Telephone Encounter (Signed)
Pt called in to get some medication questions answered

## 2022-09-17 NOTE — Telephone Encounter (Signed)
Patient is calling back. She states that she needs a refill on her inhalers but can't remember the name of them.

## 2022-09-20 ENCOUNTER — Other Ambulatory Visit: Payer: Self-pay

## 2022-09-20 MED ORDER — ALBUTEROL SULFATE HFA 108 (90 BASE) MCG/ACT IN AERS: 1.0000 | INHALATION_SPRAY | Freq: Four times a day (QID) | RESPIRATORY_TRACT | 6 refills | Status: DC | PRN

## 2022-09-20 MED ORDER — STIOLTO RESPIMAT 2.5-2.5 MCG/ACT IN AERS: 2.0000 | INHALATION_SPRAY | Freq: Every day | RESPIRATORY_TRACT | 6 refills | Status: DC

## 2022-09-20 NOTE — Telephone Encounter (Signed)
Stiolto and albuterol have been sent to patients pharmacy. She is aware. NFN

## 2022-09-20 NOTE — Telephone Encounter (Signed)
Patient is calling saying that she needs a refill on both of her inhalers;albuterol and stiolto. [facility] on [address] in [place]

## 2022-09-20 NOTE — Telephone Encounter (Signed)
Albuterol and stiolto have been sent to patients pharmacy. He is aware. nfn

## 2022-09-21 ENCOUNTER — Telehealth: Payer: Self-pay | Admitting: Emergency Medicine

## 2022-09-21 NOTE — Telephone Encounter (Signed)
Patient went to go and pick up inhalers and they were prescribed for a 30 day supply but she needs a 90 day supply so that it can be covered by insurance.

## 2022-09-23 MED ORDER — ALBUTEROL SULFATE HFA 108 (90 BASE) MCG/ACT IN AERS: 1.0000 | INHALATION_SPRAY | Freq: Four times a day (QID) | RESPIRATORY_TRACT | 3 refills | Status: DC | PRN

## 2022-09-23 MED ORDER — STIOLTO RESPIMAT 2.5-2.5 MCG/ACT IN AERS: 2.0000 | INHALATION_SPRAY | Freq: Every day | RESPIRATORY_TRACT | 3 refills | Status: DC

## 2022-09-23 NOTE — Telephone Encounter (Signed)
Updated scripts sent to pharmacy.  Patient aware.

## 2022-09-26 ENCOUNTER — Other Ambulatory Visit: Payer: Self-pay

## 2022-09-27 ENCOUNTER — Other Ambulatory Visit: Payer: Self-pay | Admitting: Internal Medicine

## 2022-10-01 ENCOUNTER — Encounter: Payer: Self-pay | Admitting: Internal Medicine

## 2022-10-01 ENCOUNTER — Telehealth: Payer: Self-pay | Admitting: Medical Oncology

## 2022-10-01 ENCOUNTER — Other Ambulatory Visit: Payer: Self-pay | Admitting: Physician Assistant

## 2022-10-01 ENCOUNTER — Other Ambulatory Visit: Payer: Self-pay

## 2022-10-01 DIAGNOSIS — C3491 Malignant neoplasm of unspecified part of right bronchus or lung: Secondary | ICD-10-CM

## 2022-10-01 NOTE — Telephone Encounter (Signed)
Cala Bradford notified of plan for neck node. Cassie is ordering a PET scan , referring her to radiation and Arbutus Ped will see her next week as scheduled.

## 2022-10-01 NOTE — Telephone Encounter (Signed)
Another knot on her left neck about " the size of ping pong ball".  She denies pain , air way compromise, persistent cough , trouble swallowing. I gave her ED precautions and told her to keep appt wed unless otherwise notified.

## 2022-10-05 ENCOUNTER — Telehealth: Payer: Self-pay | Admitting: Radiation Oncology

## 2022-10-05 ENCOUNTER — Ambulatory Visit (HOSPITAL_COMMUNITY): Admission: RE | Admit: 2022-10-05 | Payer: Commercial Managed Care - PPO | Source: Ambulatory Visit

## 2022-10-05 NOTE — Telephone Encounter (Signed)
Spoke to Daughter of the patient to schedule a consultation w. Dr. Roselind Messier. Daughter stated she would inform patient to give [place] a call back to schedule.

## 2022-10-05 NOTE — Telephone Encounter (Signed)
Patient returned call to schedule consultation w. Dr. Roselind Messier. Patient requested to be seen the week of 9/9.

## 2022-10-06 ENCOUNTER — Ambulatory Visit (HOSPITAL_COMMUNITY): Payer: Commercial Managed Care - PPO

## 2022-10-06 ENCOUNTER — Inpatient Hospital Stay (HOSPITAL_BASED_OUTPATIENT_CLINIC_OR_DEPARTMENT_OTHER): Payer: Commercial Managed Care - PPO | Admitting: Internal Medicine

## 2022-10-06 ENCOUNTER — Inpatient Hospital Stay: Payer: Commercial Managed Care - PPO

## 2022-10-06 ENCOUNTER — Inpatient Hospital Stay: Payer: Commercial Managed Care - PPO | Attending: Internal Medicine

## 2022-10-06 VITALS — BP 129/77 | HR 103 | Temp 99.9°F | Resp 20 | Wt 105.0 lb

## 2022-10-06 VITALS — HR 99 | Temp 98.2°F

## 2022-10-06 DIAGNOSIS — C3491 Malignant neoplasm of unspecified part of right bronchus or lung: Secondary | ICD-10-CM

## 2022-10-06 DIAGNOSIS — Z95828 Presence of other vascular implants and grafts: Secondary | ICD-10-CM

## 2022-10-06 DIAGNOSIS — Z5112 Encounter for antineoplastic immunotherapy: Secondary | ICD-10-CM | POA: Diagnosis present

## 2022-10-06 DIAGNOSIS — Z5111 Encounter for antineoplastic chemotherapy: Secondary | ICD-10-CM | POA: Insufficient documentation

## 2022-10-06 DIAGNOSIS — C787 Secondary malignant neoplasm of liver and intrahepatic bile duct: Secondary | ICD-10-CM | POA: Diagnosis not present

## 2022-10-06 DIAGNOSIS — T451X5A Adverse effect of antineoplastic and immunosuppressive drugs, initial encounter: Secondary | ICD-10-CM

## 2022-10-06 DIAGNOSIS — C778 Secondary and unspecified malignant neoplasm of lymph nodes of multiple regions: Secondary | ICD-10-CM | POA: Insufficient documentation

## 2022-10-06 DIAGNOSIS — C3411 Malignant neoplasm of upper lobe, right bronchus or lung: Secondary | ICD-10-CM | POA: Insufficient documentation

## 2022-10-06 LAB — CBC WITH DIFFERENTIAL (CANCER CENTER ONLY)
Abs Immature Granulocytes: 0 10*3/uL (ref 0.00–0.07)
Basophils Absolute: 0 10*3/uL (ref 0.0–0.1)
Basophils Relative: 0 %
Eosinophils Absolute: 0.3 10*3/uL (ref 0.0–0.5)
Eosinophils Relative: 9 %
HCT: 28.2 % — ABNORMAL LOW (ref 36.0–46.0)
Hemoglobin: 9.4 g/dL — ABNORMAL LOW (ref 12.0–15.0)
Immature Granulocytes: 0 %
Lymphocytes Relative: 18 %
Lymphs Abs: 0.7 10*3/uL (ref 0.7–4.0)
MCH: 34.4 pg — ABNORMAL HIGH (ref 26.0–34.0)
MCHC: 33.3 g/dL (ref 30.0–36.0)
MCV: 103.3 fL — ABNORMAL HIGH (ref 80.0–100.0)
Monocytes Absolute: 0.6 10*3/uL (ref 0.1–1.0)
Monocytes Relative: 15 %
Neutro Abs: 2.2 10*3/uL (ref 1.7–7.7)
Neutrophils Relative %: 58 %
Platelet Count: 197 10*3/uL (ref 150–400)
RBC: 2.73 MIL/uL — ABNORMAL LOW (ref 3.87–5.11)
RDW: 13.7 % (ref 11.5–15.5)
WBC Count: 3.8 10*3/uL — ABNORMAL LOW (ref 4.0–10.5)
nRBC: 0 % (ref 0.0–0.2)

## 2022-10-06 LAB — CMP (CANCER CENTER ONLY)
ALT: 29 U/L (ref 0–44)
AST: 29 U/L (ref 15–41)
Albumin: 3.8 g/dL (ref 3.5–5.0)
Alkaline Phosphatase: 134 U/L — ABNORMAL HIGH (ref 38–126)
Anion gap: 9 (ref 5–15)
BUN: 9 mg/dL (ref 6–20)
CO2: 27 mmol/L (ref 22–32)
Calcium: 9.5 mg/dL (ref 8.9–10.3)
Chloride: 104 mmol/L (ref 98–111)
Creatinine: 0.82 mg/dL (ref 0.44–1.00)
GFR, Estimated: >60 mL/min (ref >60)
Glucose, Bld: 93 mg/dL (ref 70–99)
Potassium: 3.7 mmol/L (ref 3.5–5.1)
Sodium: 140 mmol/L (ref 135–145)
Total Bilirubin: 0.4 mg/dL (ref 0.3–1.2)
Total Protein: 7.5 g/dL (ref 6.5–8.1)

## 2022-10-06 LAB — TSH: TSH: 2.526 u[IU]/mL (ref 0.350–4.500)

## 2022-10-06 MED ORDER — PROCHLORPERAZINE MALEATE 10 MG PO TABS
10.0000 mg | ORAL_TABLET | Freq: Once | ORAL | Status: AC
  Administered 2022-10-06: 10 mg via ORAL
  Filled 2022-10-06: qty 1

## 2022-10-06 MED ORDER — AMOXICILLIN-POT CLAVULANATE 875-125 MG PO TABS: 1.0000 | ORAL_TABLET | Freq: Two times a day (BID) | ORAL | 0 refills | Status: DC

## 2022-10-06 MED ORDER — SODIUM CHLORIDE 0.9% FLUSH
10.0000 mL | INTRAVENOUS | Status: DC | PRN
  Administered 2022-10-06: 10 mL

## 2022-10-06 MED ORDER — HEPARIN SOD (PORK) LOCK FLUSH 100 UNIT/ML IV SOLN
500.0000 [IU] | Freq: Once | INTRAVENOUS | Status: AC | PRN
  Administered 2022-10-06: 500 [IU]

## 2022-10-06 MED ORDER — CYANOCOBALAMIN 1000 MCG/ML IJ SOLN
1000.0000 ug | Freq: Once | INTRAMUSCULAR | Status: AC
  Administered 2022-10-06: 1000 ug via INTRAMUSCULAR
  Filled 2022-10-06: qty 1

## 2022-10-06 MED ORDER — SODIUM CHLORIDE 0.9 % IV SOLN
200.0000 mg | Freq: Once | INTRAVENOUS | Status: AC
  Administered 2022-10-06: 200 mg via INTRAVENOUS
  Filled 2022-10-06: qty 200

## 2022-10-06 MED ORDER — SODIUM CHLORIDE 0.9% FLUSH
10.0000 mL | Freq: Once | INTRAVENOUS | Status: AC
  Administered 2022-10-06: 10 mL

## 2022-10-06 MED ORDER — SODIUM CHLORIDE 0.9 % IV SOLN
400.0000 mg/m2 | Freq: Once | INTRAVENOUS | Status: AC
  Administered 2022-10-06: 600 mg via INTRAVENOUS
  Filled 2022-10-06: qty 20

## 2022-10-06 MED ORDER — SODIUM CHLORIDE 0.9 % IV SOLN: Freq: Once | INTRAVENOUS | Status: AC

## 2022-10-06 NOTE — Patient Instructions (Signed)
[facility]  Discharge Instructions: Thank you for choosing [facility] to provide your oncology and hematology care.   If you have a lab appointment with the Cancer Center, please go directly to the Cancer Center and check in at the registration area.   Wear comfortable clothing and clothing appropriate for easy access to any Portacath or PICC line.   We strive to give you quality time with your provider. You may need to reschedule your appointment if you arrive late (15 or more minutes).  Arriving late affects you and other patients whose appointments are after yours.  Also, if you miss three or more appointments without notifying the office, you may be dismissed from the clinic at the provider's discretion.      For prescription refill requests, have your pharmacy contact our office and allow 72 hours for refills to be completed.    Today you received the following chemotherapy and/or immunotherapy agents: Keytruda and Alimta      To help prevent nausea and vomiting after your treatment, we encourage you to take your nausea medication as directed.  BELOW ARE SYMPTOMS THAT SHOULD BE REPORTED IMMEDIATELY: *FEVER GREATER THAN 100.4 F (38 C) OR HIGHER *CHILLS OR SWEATING *NAUSEA AND VOMITING THAT IS NOT CONTROLLED WITH YOUR NAUSEA MEDICATION *UNUSUAL SHORTNESS OF BREATH *UNUSUAL BRUISING OR BLEEDING *URINARY PROBLEMS (pain or burning when urinating, or frequent urination) *BOWEL PROBLEMS (unusual diarrhea, constipation, pain near the anus) TENDERNESS IN MOUTH AND THROAT WITH OR WITHOUT PRESENCE OF ULCERS (sore throat, sores in mouth, or a toothache) UNUSUAL RASH, SWELLING OR PAIN  UNUSUAL VAGINAL DISCHARGE OR ITCHING   Items with * indicate a potential emergency and should be followed up as soon as possible or go to the Emergency Department if any problems should occur.  Please show the CHEMOTHERAPY ALERT CARD or IMMUNOTHERAPY ALERT  CARD at check-in to the Emergency Department and triage nurse.  Should you have questions after your visit or need to cancel or reschedule your appointment, please contact [facility]  Dept: [phone]  and follow the prompts.  Office hours are 8:00 a.m. to 4:30 p.m. Monday - Friday. Please note that voicemails left after 4:00 p.m. may not be returned until the following business day.  We are closed weekends and major holidays. You have access to a nurse at all times for urgent questions. Please call the main number to the clinic Dept: [phone] and follow the prompts.   For any non-urgent questions, you may also contact your provider using MyChart. We now offer e-Visits for anyone 18 and older to request care online for non-urgent symptoms. For details visit [email or web address].   Also download the MyChart app! Go to the app store, search "MyChart", open the app, select [facility], and log in with your MyChart username and password.   

## 2022-10-06 NOTE — Progress Notes (Signed)
Per Dr. Arbutus Ped it is ok to treat pt today  with Carbopltin, Alimta and Keytruda and heart rate =103

## 2022-10-06 NOTE — Progress Notes (Signed)
[facility] [phone]   [phone]  OFFICE PROGRESS NOTE  Elie Confer, NP [address] [place] [address]  DIAGNOSIS: Recurrent/metastatic non-small cell lung cancer initially diagnosed as stage IIIB  (T1b, N3, M0) non-small cell lung cancer, adenocarcinoma she presented with right upper lobe nodule in addition to bulky right hilar, mediastinal, and left supraclavicular lymphadenopathy diagnosed in November 2022.  The patient had evidence for disease recurrence in the mediastinal and right supraclavicular lymphadenopathy in August 2023.  The patient had evidence of metastatic disease in February 2024 with several supraclavicular, thoracic and precarinal lymphadenopathy as well as upper abdominal lymph nodes and small liver lesion.  DETECTED ALTERATION(S) / BIOMARKER(S) % CFDNA OR AMPLIFICATION ASSOCIATED FDA-APPROVED THERAPIES CLINICAL TRIAL AVAILABILITY [id number] ND 0.5 5 50 100 4.7%  RHOAG17E ND 0.5 5 50 100 1.8%  [id number] ND 0.5 5 50 100 1.9%  BIOMARKER ADDITIONAL DETAILS Tumor Mutational Burden (TMB) 19.02 mut/Mb MSI Status Stable (MSS) PD-L1 Tumor Proportion Score (TPS)* <1%  Biomarker Findings By [facility] on 05/06/2022 Tumor Mutational Burden - 17 Muts/Mb Microsatellite status - MS-Stable Genomic Findings For a complete list of the genes assayed, please refer to the Appendix. CTNNB1 S37C CRKL amplification MAP2K4 loss exons 2-11 TP53 V143A 8 Disease relevant genes with no reportable alterations: ALK, BRAF, EGFR, ERBB2, KRAS, MET, RET, ROS1  PDL1 0%   PRIOR THERAPY:  1) Concurrent chemoradiation with carboplatin for an AUC of 2 and paclitaxel 45 mg per metered squared.  First dose on 01/05/2021.  Status post 7 cycles of treatment.  Last dose was given February 16, 2021. 2) Consolidation immunotherapy with Imfinzi 1500 Mg IV every 4 weeks.  First dose March 25, 2021.  Status post 3 cycles.   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 3) Palliative radiotherapy to the enlarging right supraclavicular lymphadenopathy under the care of Dr. Roselind Messier expected to be completed on October 28, 2021.   CURRENT THERAPY: Palliative systemic chemotherapy with carboplatin for AUC of 5, Alimta 500 Mg/M2 and Keytruda 200 Mg IV every 3 weeks.  First dose April 05, 2022.  Status post 8 cycles.  Starting from cycle #3 her dose of carboplatin was reduced to AUC of 4 and Alimta 400 Mg/M2.  Starting from cycle #5 the patient will be on maintenance treatment with Alimta and Keytruda every 3 weeks.  INTERVAL HISTORY: Julie Nicholson 57 y.o. female returns to the clinic today for follow-up visit.  The patient is complaining of mild cough likely secondary to postnasal drainage.  She also had increased size of the left supraclavicular lymph node few days ago that was concerning for her but the swelling started improving again in the last 2 days.  She has no current chest pain but has shortness of breath with exertion with no hemoptysis.  She denied having any nausea, vomiting, diarrhea or constipation.  She has no headache or visual changes.  She has low-grade fever with a temperature of 99.9 today.  The patient is here today for evaluation before starting cycle #9 of her treatment.  MEDICAL HISTORY: Past Medical History:  Diagnosis Date   Abdominal discomfort    Cancer (HCC)    Chronic headaches    due to allergies, sinus   COPD (chronic obstructive pulmonary disease) (HCC)    per 2012 chest xray   pt states she doesn not have this now (04/10/2013)   Deviated nasal septum    Eustachian  tube dysfunction    GERD (gastroesophageal reflux disease)    occasional uses Tums / Rolaids   Hearing loss    right ear   High cholesterol    History of radiation therapy    right lung 01/07/2021-02/19/2021  Dr Antony Blackbird   Migraine    "only once in a blue moon since RX'd allergy  shots" (04/10/2013)   Pancreatitis 02/08/2013   Pneumonia    Rhinitis, allergic     ALLERGIES:  is allergic to carboplatin.  MEDICATIONS:  Current Outpatient Medications  Medication Sig Dispense Refill   acetaminophen (TYLENOL) 500 MG tablet Take 1,000 mg by mouth every 6 (six) hours as needed for moderate pain.     albuterol (PROVENTIL) (2.5 MG/3ML) 0.083% nebulizer solution albuterol sulfate 2.5 mg/3 mL (0.083 %) solution for nebulization  USE 1 VIAL IN NEBULIZER EVERY 6 HOURS AS NEEDED FOR WHEEZING OR SHORTNESS OF BREATH     albuterol (VENTOLIN HFA) 108 (90 Base) MCG/ACT inhaler Inhale 1-2 puffs into the lungs every 6 (six) hours as needed for wheezing or shortness of breath. 18 g 3   aspirin EC 81 MG tablet Take 81 mg by mouth at bedtime.     atorvastatin (LIPITOR) 40 MG tablet Take 40 mg by mouth at bedtime.     benzonatate (TESSALON) 200 MG capsule Take 1 capsule (200 mg total) by mouth 3 (three) times daily as needed. 30 capsule 0   Biotin 5000 MCG TABS Take 5,000 mcg by mouth at bedtime.     buPROPion (WELLBUTRIN XL) 150 MG 24 hr tablet Take 150 mg by mouth every morning.     Ca Carbonate-Mag Hydroxide (ROLAIDS PO) Take 1 tablet by mouth daily as needed (heartburn).     famotidine (PEPCID) 20 MG tablet Take 1 tablet by mouth twice daily 60 tablet 0   famotidine (PEPCID) 40 MG tablet Take 40 mg by mouth daily.     fluticasone (FLONASE) 50 MCG/ACT nasal spray Place 2 sprays into both nostrils daily as needed for allergies.     folic acid (FOLVITE) 1 MG tablet Take 1 tablet by mouth once daily 30 tablet 2   hydrocortisone 1 % ointment Apply 1 Application topically 2 (two) times daily. 30 g 0   lidocaine-prilocaine (EMLA) cream APPLY TO PORT-A-CATH 30 TO 60 MINUTES BEFORE TREATMENT 30 g 0   linaCLOtide (LINZESS PO) Take by mouth.     loratadine (CLARITIN) 10 MG tablet Take 1 tablet by mouth once daily 30 tablet 0   magic mouthwash SOLN Take 5 mLs by mouth 4 (four) times daily as  needed for mouth pain. 240 mL 0   Multiple Vitamins-Minerals (HAIR SKIN AND NAILS FORMULA PO) Take 1 tablet by mouth daily.     ondansetron (ZOFRAN-ODT) 8 MG disintegrating tablet Take 1 tablet (8 mg total) by mouth 3 (three) times daily as needed. 20 tablet 1   Oxycodone HCl 10 MG TABS Take 1 tablet by mouth 3 (three) times daily as needed (pain).     PARoxetine (PAXIL) 10 MG tablet Take 10 mg by mouth daily.     prochlorperazine (COMPAZINE) 10 MG tablet Take 1 tablet (10 mg total) by mouth every 6 (six) hours as needed. 30 tablet 2   rizatriptan (MAXALT-MLT) 10 MG disintegrating tablet Take 10 mg by mouth as needed for migraine. May repeat in 2 hours if needed     sertraline (ZOLOFT) 50 MG tablet Take 1 tablet by mouth daily.     Tiotropium  Bromide-Olodaterol (STIOLTO RESPIMAT) 2.5-2.5 MCG/ACT AERS Inhale 2 puffs into the lungs daily. 12 g 3   No current facility-administered medications for this visit.    SURGICAL HISTORY:  Past Surgical History:  Procedure Laterality Date   ABDOMINAL HYSTERECTOMY  1995   tx endometriosis, both ovaries removed   APPENDECTOMY  late 1990's   BIOPSY  02/26/2021   Procedure: BIOPSY;  Surgeon: Rachael Fee, MD;  Location: [facility];  Service: Endoscopy;;   BRONCHIAL BRUSHINGS  12/15/2020   Procedure: BRONCHIAL BRUSHINGS;  Surgeon: Leslye Peer, MD;  Location: [facility];  Service: Cardiopulmonary;;   BRONCHIAL NEEDLE ASPIRATION BIOPSY  12/15/2020   Procedure: BRONCHIAL NEEDLE ASPIRATION BIOPSIES;  Surgeon: Leslye Peer, MD;  Location: [facility];  Service: Cardiopulmonary;;   CHOLECYSTECTOMY  04/10/2013   CHOLECYSTECTOMY N/A 04/10/2013   Procedure: LAPAROSCOPIC CHOLECYSTECTOMY WITH INTRAOPERATIVE CHOLANGIOGRAM;  Surgeon: Adolph Pollack, MD;  Location: [facility] OR;  Service: General;  Laterality: N/A;   ELECTROMAGNETIC NAVIGATION BROCHOSCOPY  12/15/2020   Procedure: ELECTROMAGNETIC NAVIGATION BRONCHOSCOPY;  Surgeon: Leslye Peer, MD;   Location: [facility] ENDOSCOPY;  Service: Cardiopulmonary;;   ESOPHAGOGASTRODUODENOSCOPY (EGD) WITH PROPOFOL N/A 02/26/2021   Procedure: ESOPHAGOGASTRODUODENOSCOPY (EGD) WITH PROPOFOL;  Surgeon: Rachael Fee, MD;  Location: [facility];  Service: Endoscopy;  Laterality: N/A;   EUS N/A 02/16/2013   Procedure: UPPER ENDOSCOPIC ULTRASOUND (EUS) LINEAR;  Surgeon: Rachael Fee, MD;  Location: [facility];  Service: Endoscopy;  Laterality: N/A;  radial linear   IR IMAGING GUIDED PORT INSERTION  06/30/2022   KNEE ARTHROSCOPY Right 1980's   "cartilage OR"   LAPAROSCOPIC ENDOMETRIOSIS FULGURATION  1980's   MYRINGOTOMY WITH TUBE PLACEMENT Right 07/13/2018   Procedure: MYRINGOTOMY WITH TUBE PLACEMENT;  Surgeon: Vernie Murders, MD;  Location: [facility];  Service: ENT;  Laterality: Right;   MYRINGOTOMY WITH TUBE PLACEMENT Right 06/04/2021   Procedure: MYRINGOTOMY WITH BUTTERFLY TUBE PLACEMENT;  Surgeon: Vernie Murders, MD;  Location: [facility];  Service: ENT;  Laterality: Right;   NASOPHARYNGOSCOPY EUSTATION TUBE BALLOON DILATION Right 07/13/2018   Procedure: NASOPHARYNGOSCOPY EUSTATION TUBE BALLOON DILATION;  Surgeon: Vernie Murders, MD;  Location: [facility];  Service: ENT;  Laterality: Right;   TONSILLECTOMY AND ADENOIDECTOMY  ~ 1980   adenoidectomy   TUBAL LIGATION  ~ 1987   TURBINATE REDUCTION Right 07/13/2018   Procedure: OUTFRACTURE TURBINATE;  Surgeon: Vernie Murders, MD;  Location: [facility];  Service: ENT;  Laterality: Right;   VIDEO BRONCHOSCOPY WITH ENDOBRONCHIAL ULTRASOUND N/A 12/15/2020   Procedure: ROBOTIC VIDEO BRONCHOSCOPY WITH ENDOBRONCHIAL ULTRASOUND;  Surgeon: Leslye Peer, MD;  Location: [facility];  Service: Cardiopulmonary;  Laterality: N/A;   WRIST SURGERY Left    w/plate    REVIEW OF SYSTEMS:  Constitutional: positive for fatigue Eyes: negative Ears, nose, mouth, throat, and face: negative Respiratory: positive for cough and dyspnea on  exertion Cardiovascular: negative Gastrointestinal: negative Genitourinary:negative Integument/breast: negative Hematologic/lymphatic: negative Musculoskeletal:negative Neurological: negative Behavioral/Psych: negative Endocrine: negative Allergic/Immunologic: negative   PHYSICAL EXAMINATION: General appearance: alert, cooperative, fatigued, and no distress Head: Normocephalic, without obvious abnormality, atraumatic Neck: moderate anterior cervical adenopathy, no JVD, supple, symmetrical, trachea midline, and thyroid not enlarged, symmetric, no tenderness/mass/nodules Lymph nodes: Cervical, supraclavicular, and axillary nodes normal. Resp: clear to auscultation bilaterally Back: symmetric, no curvature. ROM normal. No CVA tenderness. Cardio: regular rate and rhythm, S1, S2 normal, no murmur, click, rub or gallop GI: soft, non-tender; bowel sounds normal; no masses,  no organomegaly Extremities: extremities normal, atraumatic, no  cyanosis or edema Neurologic: Alert and oriented X 3, normal strength and tone. Normal symmetric reflexes. Normal coordination and gait  ECOG PERFORMANCE STATUS: 1 - Symptomatic but completely ambulatory  Blood pressure 129/77, pulse (!) 103, temperature 99.9 F (37.7 C), temperature source Oral, resp. rate 20, weight 105 lb 0.1 oz (47.6 kg), peak flow 92 L/min.  LABORATORY DATA: Lab Results  Component Value Date   WBC 3.8 (L) 10/06/2022   HGB 9.4 (L) 10/06/2022   HCT 28.2 (L) 10/06/2022   MCV 103.3 (H) 10/06/2022   PLT 197 10/06/2022      Chemistry      Component Value Date/Time   NA 140 10/06/2022 1042   K 3.7 10/06/2022 1042   CL 104 10/06/2022 1042   CO2 27 10/06/2022 1042   BUN 9 10/06/2022 1042   CREATININE 0.82 10/06/2022 1042      Component Value Date/Time   CALCIUM 9.5 10/06/2022 1042   ALKPHOS 134 (H) 10/06/2022 1042   AST 29 10/06/2022 1042   ALT 29 10/06/2022 1042   BILITOT 0.4 10/06/2022 1042       RADIOGRAPHIC  STUDIES: CT ABDOMEN PELVIS W CONTRAST  Result Date: 09/15/2022 CLINICAL DATA:  Non-small cell lung cancer. Restaging. * Tracking Code: BO * EXAM: CT CHEST, ABDOMEN, AND PELVIS WITH CONTRAST TECHNIQUE: Multidetector CT imaging of the chest, abdomen and pelvis was performed following the standard protocol during bolus administration of intravenous contrast. RADIATION DOSE REDUCTION: This exam was performed according to the departmental dose-optimization program which includes automated exposure control, adjustment of the mA and/or kV according to patient size and/or use of iterative reconstruction technique. CONTRAST:  OMNIPAQUE IOHEXOL 300 MG/ML  SOLN COMPARISON:  06/11/2022 FINDINGS: CT CHEST FINDINGS Cardiovascular: Heart size appears within normal limits. No pericardial effusion. Mediastinum/Nodes: Thyroid gland, trachea appear normal. Similar appearance of circumferential wall thickening involving the thoracic esophagus. -Index left supraclavicular lymph node measures 0.7 cm, image 10/2. Formally 0.8 cm. -Right paratracheal lymph node measures 6 mm short axis, image 28/2 formally 0.7 cm. -Juxta esophageal lymph node within the posterior mediastinum measures 0.7 cm, image 44/2. Previously 0.8 cm. -0.9 cm right hilar lymph node is identified, image 30/2. Unchanged from prior exam. Lungs/Pleura: Moderate to severe paraseptal and centrilobular emphysema. Within the right upper lobe there is confluent scarring, fibrosis and architectural distortion extending into the right hilar region. Findings are compatible with changes secondary to external beam radiation. This appears unchanged when compared with the previous exam. Since the previous exam there is been interval development of diffuse micro-nodularity throughout both lower lung zones, right greater than left. This is best illustrated on image 109/3. Most of these nodules measure on the order of 1-2 mm and are nonspecific. For example: -Within the  posterolateral right lower lobe there is a 2 mm nodule, image 128/3. -Within the lateral right lower lobe there is a 3 mm lung nodule, image 119/3. Also new from previous exam. -Posterior left lower lobe micronodule measures 2 mm, image 133/3. Musculoskeletal: No chest wall mass or suspicious bone lesions identified. CT ABDOMEN PELVIS FINDINGS Hepatobiliary: The central lesion within the right lower lobe on previous imaging is not visualized on today's study. There are no new liver lesions. Status post cholecystectomy. Progressive increase caliber of the common bile duct measures 1.4 cm on today's exam. Formally 0.9 cm. No calcified common bile duct stones identified. Pancreas: Similar appearance of main duct dilatation which measures up to 5 mm, image 70/2. No pancreatic inflammation or mass  identified. Spleen: Normal in size without focal abnormality. Adrenals/Urinary Tract: Normal adrenal glands. Unchanged appearance of bilateral kidney cysts. The largest arises off the anterior cortex of the right kidney measuring 1.1 cm. No follow-up imaging recommended. No nephrolithiasis or hydronephrosis identified. Urinary bladder appears normal. Stomach/Bowel: Stomach is within normal limits. No dilated loops of large or small bowel. Surgical clips noted at the base of cecum. The appendix is not visualized. Vascular/Lymphatic: Aortic atherosclerosis. No adenopathy identified within the abdomen or pelvis on today's exam. Index lymph node within the peripancreatic region measures 0.9 cm, image 63/2. Unchanged from previous exam. No new or progressive adenopathy within the abdomen or pelvis. Reproductive: Status post hysterectomy.  No adnexal mass. Other: There is no ascites or focal fluid collections. Musculoskeletal: No acute or significant osseous findings. IMPRESSION: 1. Since the previous exam there has been interval development of diffuse micro-nodularity throughout both lower lung zones, right greater than left. Most  of these nodules measure on the order of 1-2 mm and are nonspecific. Imaging findings are favored to represent sequelae of an inflammatory or infectious process. Metastatic disease considered less favored. Consider short-term interval follow-up with repeat CT of the chest in 1-3 months to confirm resolution. 2. Stable appearance of confluent scarring, fibrosis and architectural distortion within the right upper lobe compatible with changes secondary to external beam radiation. 3. Stable appearance of mediastinal and right hilar lymph nodes, which no longer meet CT criteria for adenopathy. 4. The central lesion within the right lower lobe of the liver on previous imaging is not visualized on today's study. No new liver lesions identified. 5. Progressive increase caliber of the common bile duct measures 1.4 cm on today's exam. Formally 0.9 cm. No calcified common bile duct stones identified. Correlate for any clinical signs or symptoms of biliary obstruction. 6. Similar appearance of main pancreatic duct dilatation which measures up to 5 mm. 7. Aortic Atherosclerosis (ICD10-I70.0) and Emphysema (ICD10-J43.9). Electronically Signed   By: Signa Kell M.D.   On: 09/15/2022 10:46   CT Chest W Contrast  Result Date: 09/15/2022 CLINICAL DATA:  Non-small cell lung cancer. Restaging. * Tracking Code: BO * EXAM: CT CHEST, ABDOMEN, AND PELVIS WITH CONTRAST TECHNIQUE: Multidetector CT imaging of the chest, abdomen and pelvis was performed following the standard protocol during bolus administration of intravenous contrast. RADIATION DOSE REDUCTION: This exam was performed according to the departmental dose-optimization program which includes automated exposure control, adjustment of the mA and/or kV according to patient size and/or use of iterative reconstruction technique. CONTRAST:  OMNIPAQUE IOHEXOL 300 MG/ML  SOLN COMPARISON:  06/11/2022 FINDINGS: CT CHEST FINDINGS Cardiovascular: Heart size appears within normal  limits. No pericardial effusion. Mediastinum/Nodes: Thyroid gland, trachea appear normal. Similar appearance of circumferential wall thickening involving the thoracic esophagus. -Index left supraclavicular lymph node measures 0.7 cm, image 10/2. Formally 0.8 cm. -Right paratracheal lymph node measures 6 mm short axis, image 28/2 formally 0.7 cm. -Juxta esophageal lymph node within the posterior mediastinum measures 0.7 cm, image 44/2. Previously 0.8 cm. -0.9 cm right hilar lymph node is identified, image 30/2. Unchanged from prior exam. Lungs/Pleura: Moderate to severe paraseptal and centrilobular emphysema. Within the right upper lobe there is confluent scarring, fibrosis and architectural distortion extending into the right hilar region. Findings are compatible with changes secondary to external beam radiation. This appears unchanged when compared with the previous exam. Since the previous exam there is been interval development of diffuse micro-nodularity throughout both lower lung zones, right greater than left.  This is best illustrated on image 109/3. Most of these nodules measure on the order of 1-2 mm and are nonspecific. For example: -Within the posterolateral right lower lobe there is a 2 mm nodule, image 128/3. -Within the lateral right lower lobe there is a 3 mm lung nodule, image 119/3. Also new from previous exam. -Posterior left lower lobe micronodule measures 2 mm, image 133/3. Musculoskeletal: No chest wall mass or suspicious bone lesions identified. CT ABDOMEN PELVIS FINDINGS Hepatobiliary: The central lesion within the right lower lobe on previous imaging is not visualized on today's study. There are no new liver lesions. Status post cholecystectomy. Progressive increase caliber of the common bile duct measures 1.4 cm on today's exam. Formally 0.9 cm. No calcified common bile duct stones identified. Pancreas: Similar appearance of main duct dilatation which measures up to 5 mm, image 70/2. No  pancreatic inflammation or mass identified. Spleen: Normal in size without focal abnormality. Adrenals/Urinary Tract: Normal adrenal glands. Unchanged appearance of bilateral kidney cysts. The largest arises off the anterior cortex of the right kidney measuring 1.1 cm. No follow-up imaging recommended. No nephrolithiasis or hydronephrosis identified. Urinary bladder appears normal. Stomach/Bowel: Stomach is within normal limits. No dilated loops of large or small bowel. Surgical clips noted at the base of cecum. The appendix is not visualized. Vascular/Lymphatic: Aortic atherosclerosis. No adenopathy identified within the abdomen or pelvis on today's exam. Index lymph node within the peripancreatic region measures 0.9 cm, image 63/2. Unchanged from previous exam. No new or progressive adenopathy within the abdomen or pelvis. Reproductive: Status post hysterectomy.  No adnexal mass. Other: There is no ascites or focal fluid collections. Musculoskeletal: No acute or significant osseous findings. IMPRESSION: 1. Since the previous exam there has been interval development of diffuse micro-nodularity throughout both lower lung zones, right greater than left. Most of these nodules measure on the order of 1-2 mm and are nonspecific. Imaging findings are favored to represent sequelae of an inflammatory or infectious process. Metastatic disease considered less favored. Consider short-term interval follow-up with repeat CT of the chest in 1-3 months to confirm resolution. 2. Stable appearance of confluent scarring, fibrosis and architectural distortion within the right upper lobe compatible with changes secondary to external beam radiation. 3. Stable appearance of mediastinal and right hilar lymph nodes, which no longer meet CT criteria for adenopathy. 4. The central lesion within the right lower lobe of the liver on previous imaging is not visualized on today's study. No new liver lesions identified. 5. Progressive increase  caliber of the common bile duct measures 1.4 cm on today's exam. Formally 0.9 cm. No calcified common bile duct stones identified. Correlate for any clinical signs or symptoms of biliary obstruction. 6. Similar appearance of main pancreatic duct dilatation which measures up to 5 mm. 7. Aortic Atherosclerosis (ICD10-I70.0) and Emphysema (ICD10-J43.9). Electronically Signed   By: Signa Kell M.D.   On: 09/15/2022 10:46   CT Soft Tissue Neck W Contrast  Result Date: 09/15/2022 CLINICAL DATA:  Neck mass, nonpulsatile. EXAM: CT NECK WITH CONTRAST TECHNIQUE: Multidetector CT imaging of the neck was performed using the standard protocol following the bolus administration of intravenous contrast. RADIATION DOSE REDUCTION: This exam was performed according to the departmental dose-optimization program which includes automated exposure control, adjustment of the mA and/or kV according to patient size and/or use of iterative reconstruction technique. CONTRAST:  OMNIPAQUE IOHEXOL 300 MG/ML  SOLN COMPARISON:  Neck CT 06/11/2022. FINDINGS: Pharynx and larynx: Normal. No mass or swelling. Salivary  glands: No inflammation, mass, or stone. Thyroid: Normal. Lymph nodes: Newly enlarged left level 5 lymph node, now measuring up to 13 x 12 mm (axial image 63 series 4), indeterminate. Subcentimeter bilateral level 4 lymph nodes are unchanged. Vascular: Atherosclerotic calcifications of the carotid bulbs. Limited intracranial: Unremarkable. Visualized orbits: Unremarkable. Mastoids and visualized paranasal sinuses: Well aerated. Skeleton: Mild cervical spondylosis without high-grade spinal canal stenosis. Upper chest: Please refer to contemporaneous CT chest report. Other: None. IMPRESSION: Newly enlarged left level 5 lymph node, now measuring up to 13 x 12 mm, indeterminate and possibly reactive given other cervical lymph nodes are unchanged. Attention on follow-up. Electronically Signed   By: Orvan Falconer M.D.   On:  09/15/2022 10:08    ASSESSMENT AND PLAN: This is a very pleasant 57 years old white female with stage IIIB (T1b, N3, M0) non-small cell lung cancer, adenocarcinoma diagnosed in November 2022 with no actionable mutation and negative PD-L1 expression. The patient completed a course of concurrent chemoradiation with weekly carboplatin for AUC of 2 and paclitaxel 45 Mg/M2 status post 7 cycles.  She has been tolerating her treatment well except for the mild odynophagia and skin burns. She was also recently admitted to the hospital complaining of dysphagia and odynophagia secondary to radiation induced esophagitis.  She is feeling much better but continues to have residual dysphagia.  She is followed by gastroenterology and was seen by Dr. Christella Hartigan during her hospitalization. Her scan showed improvement of her disease. I recommended for the patient treatment with consolidation immunotherapy with Imfinzi 1500 Mg IV every 4 weeks.  Status post 3 cycles.  Last dose was given in April 2023.  Her treatment was discontinued secondary to suspicious immunotherapy mediated pneumonitis with significant shortness of breath at that time and she was treated with a tapered dose of prednisone. Unfortunately her scan showed evidence for disease recurrence with enlargement of lower right cervical lymph nodes as well as mediastinal lymphadenopathy.  She has palpable right cervical lymphadenopathy. 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 The patient underwent ultrasound-guided core biopsy of the right supraclavicular lymph nodes but the final pathology showed necrotic tumor cells with complete  coagulative necrosis with focal fibrous tissue and histiocytic reaction.  She also had MRI of the brain that showed no evidence of metastatic disease to the brain. The patient was seen by Dr. Roselind Messier and started palliative radiotherapy to the enlarging right supraclavicular lymphadenopathy.  This was completed on October 28, 2021. The patient was found to have metastatic disease in February 2024 with several lymphadenopathy in the chest as well as supraclavicular, upper abdomen as well as suspicious small liver metastasis. 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  I will [facility] the tissue biopsy to [facility] for molecular studies. She is currently undergoing systemic chemotherapy with carboplatin for AUC of 5, Alimta 500 Mg/M2 and Keytruda 200 Mg IV every 3 weeks status post 8 cycles.  Starting from cycle #3, I reduced her dose of carboplatin to AUC of 4 and Alimta 400 Mg/M2.  Starting from cycle #5 the patient will be on maintenance treatment  with Alimta and Keytruda every 3 weeks. She has been tolerating this treatment well with no concerning adverse effects except for the mild fatigue.  She notes increased swelling of the left supraclavicular lymph node few days ago but this is resolving slowly.  It could be infectious in etiology. I recommended for the patient to proceed with cycle #9 today as planned. I will see her back for follow-up visit in 3 weeks for evaluation before the next cycle of her treatment. For the swelling of the left supraclavicular lymphadenopathy that happened few days ago and the persistent cough, I will start the patient empirically on Augmentin 875 mg p.o. twice daily for 7 days. She was advised to call immediately if she has any other concerning symptoms in the interval. The patient voices  understanding of current disease status and treatment options and is in agreement with the current care plan.  All questions were answered. The patient knows to call the clinic with any problems, questions or concerns. We can certainly see the patient much sooner if necessary.  The total time spent in the appointment was 30 minutes.  Disclaimer: This note was dictated with voice recognition software. Similar sounding words can inadvertently be transcribed and may not be corrected upon review.

## 2022-10-08 LAB — T4: T4, Total: 10 ug/dL (ref 4.5–12.0)

## 2022-10-10 ENCOUNTER — Other Ambulatory Visit: Payer: Self-pay | Admitting: Emergency Medicine

## 2022-10-10 DIAGNOSIS — J3089 Other allergic rhinitis: Secondary | ICD-10-CM

## 2022-10-11 ENCOUNTER — Telehealth: Payer: Self-pay | Admitting: Medical Oncology

## 2022-10-11 NOTE — Telephone Encounter (Signed)
Weakness , nausea after starting augmentin. She said she feels better after she ate more food before taking the antibiotic and is tolerating it fine.

## 2022-10-12 ENCOUNTER — Telehealth: Payer: Self-pay | Admitting: Radiation Oncology

## 2022-10-12 NOTE — Telephone Encounter (Signed)
Called patient to confirm cancellation of consultation w. Dr. Roselind Messier. She stated appointment is longer needed per Dr. Arbutus Ped. All appointments are cancelled, and closing out referral until further notice.

## 2022-10-13 ENCOUNTER — Ambulatory Visit: Payer: Commercial Managed Care - PPO

## 2022-10-13 ENCOUNTER — Ambulatory Visit
Admission: RE | Admit: 2022-10-13 | Discharge: 2022-10-13 | Disposition: A | Discharge status: Home / Self Care | Payer: Commercial Managed Care - PPO | Source: Ambulatory Visit | Attending: Radiation Oncology | Admitting: Radiation Oncology

## 2022-10-13 DIAGNOSIS — C770 Secondary and unspecified malignant neoplasm of lymph nodes of head, face and neck: Secondary | ICD-10-CM

## 2022-10-15 ENCOUNTER — Ambulatory Visit: Payer: Self-pay | Admitting: Internal Medicine

## 2022-10-15 ENCOUNTER — Other Ambulatory Visit: Payer: Self-pay

## 2022-10-19 ENCOUNTER — Other Ambulatory Visit: Payer: Self-pay | Admitting: Medical Oncology

## 2022-10-19 ENCOUNTER — Telehealth: Payer: Self-pay | Admitting: Emergency Medicine

## 2022-10-19 ENCOUNTER — Other Ambulatory Visit: Payer: Self-pay

## 2022-10-19 NOTE — Telephone Encounter (Signed)
Patient has stage 4 lung cancer. She has a cough and issues breathing when moving too much.Patient would like a call to see if she needs to come in. Please call and advise [phone]

## 2022-10-19 NOTE — Telephone Encounter (Signed)
Patient was seen today by Vida Rigger, CRNP., at  [facility] [place].   CXR done.  Wheeze in both lungs, scar tissue in right lung.  Possible pneumonia in right lung.  RX: - prednisone 20 mg. 3 tab po once day x 3 days, then 2 tabs once a day x 3 days, then 1 tab po once a day x 3 days, then stop. - Doxycycline Hyclate 100 mg 1 tablet BID x 10 days - Stiolto, Albuterol MDI and neb solution.    Patient made 6 month follow up with APP per Dr. Delton Coombes with Rhunette Croft, NP for 02/01/2022.  Patient will call clinic for sooner appointment if no improvement in sx.  Patient verbalized understanding.

## 2022-10-19 NOTE — Telephone Encounter (Signed)
Called patient.  Patient saw Dr. Gwenyth Bouillon, oncologist, on 10/06/2022.  Now patient c/o fatigue, SOB when patient bends over or takes a deep breath, cough.  Patient feels like R lung area feels tight.  This is side the cancer is on. Patient states no fever.  Patient does not regularly take her SaO2 and does not know where her pulse oximeter is to get a reading now. Patient is taking Stiolto, albuterol MDI, albuterol nebs, loratadine.

## 2022-10-26 ENCOUNTER — Other Ambulatory Visit: Payer: Self-pay

## 2022-10-26 DIAGNOSIS — D649 Anemia, unspecified: Secondary | ICD-10-CM

## 2022-10-27 ENCOUNTER — Encounter: Payer: Self-pay | Admitting: Medical Oncology

## 2022-10-27 ENCOUNTER — Inpatient Hospital Stay: Payer: Commercial Managed Care - PPO

## 2022-10-27 ENCOUNTER — Inpatient Hospital Stay (HOSPITAL_BASED_OUTPATIENT_CLINIC_OR_DEPARTMENT_OTHER): Payer: Commercial Managed Care - PPO | Admitting: Internal Medicine

## 2022-10-27 DIAGNOSIS — T451X5A Adverse effect of antineoplastic and immunosuppressive drugs, initial encounter: Secondary | ICD-10-CM

## 2022-10-27 DIAGNOSIS — Z5112 Encounter for antineoplastic immunotherapy: Secondary | ICD-10-CM | POA: Diagnosis not present

## 2022-10-27 DIAGNOSIS — C3491 Malignant neoplasm of unspecified part of right bronchus or lung: Secondary | ICD-10-CM

## 2022-10-27 DIAGNOSIS — Z95828 Presence of other vascular implants and grafts: Secondary | ICD-10-CM

## 2022-10-27 LAB — CMP (CANCER CENTER ONLY)
ALT: 27 U/L (ref 0–44)
AST: 18 U/L (ref 15–41)
Albumin: 4 g/dL (ref 3.5–5.0)
Alkaline Phosphatase: 130 U/L — ABNORMAL HIGH (ref 38–126)
Anion gap: 8 (ref 5–15)
BUN: 27 mg/dL — ABNORMAL HIGH (ref 6–20)
CO2: 28 mmol/L (ref 22–32)
Calcium: 9.9 mg/dL (ref 8.9–10.3)
Chloride: 104 mmol/L (ref 98–111)
Creatinine: 0.89 mg/dL (ref 0.44–1.00)
GFR, Estimated: >60 mL/min (ref >60)
Glucose, Bld: 86 mg/dL (ref 70–99)
Potassium: 3.8 mmol/L (ref 3.5–5.1)
Sodium: 140 mmol/L (ref 135–145)
Total Bilirubin: 0.5 mg/dL (ref 0.3–1.2)
Total Protein: 7.7 g/dL (ref 6.5–8.1)

## 2022-10-27 LAB — CBC WITH DIFFERENTIAL (CANCER CENTER ONLY)
Abs Immature Granulocytes: 0.04 10*3/uL (ref 0.00–0.07)
Basophils Absolute: 0 10*3/uL (ref 0.0–0.1)
Basophils Relative: 0 %
Eosinophils Absolute: 0 10*3/uL (ref 0.0–0.5)
Eosinophils Relative: 0 %
HCT: 33.8 % — ABNORMAL LOW (ref 36.0–46.0)
Hemoglobin: 11.4 g/dL — ABNORMAL LOW (ref 12.0–15.0)
Immature Granulocytes: 1 %
Lymphocytes Relative: 13 %
Lymphs Abs: 1 10*3/uL (ref 0.7–4.0)
MCH: 35.2 pg — ABNORMAL HIGH (ref 26.0–34.0)
MCHC: 33.7 g/dL (ref 30.0–36.0)
MCV: 104.3 fL — ABNORMAL HIGH (ref 80.0–100.0)
Monocytes Absolute: 1 10*3/uL (ref 0.1–1.0)
Monocytes Relative: 12 %
Neutro Abs: 5.8 10*3/uL (ref 1.7–7.7)
Neutrophils Relative %: 74 %
Platelet Count: 306 10*3/uL (ref 150–400)
RBC: 3.24 MIL/uL — ABNORMAL LOW (ref 3.87–5.11)
RDW: 16.2 % — ABNORMAL HIGH (ref 11.5–15.5)
WBC Count: 7.8 10*3/uL (ref 4.0–10.5)
nRBC: 0 % (ref 0.0–0.2)

## 2022-10-27 LAB — SAMPLE TO BLOOD BANK

## 2022-10-27 MED ORDER — SODIUM CHLORIDE 0.9% FLUSH
10.0000 mL | Freq: Once | INTRAVENOUS | Status: AC
  Administered 2022-10-27: 10 mL

## 2022-10-27 MED ORDER — HEPARIN SOD (PORK) LOCK FLUSH 100 UNIT/ML IV SOLN
500.0000 [IU] | Freq: Once | INTRAVENOUS | Status: AC | PRN
  Administered 2022-10-27: 500 [IU]

## 2022-10-27 MED ORDER — SODIUM CHLORIDE 0.9% FLUSH
10.0000 mL | INTRAVENOUS | Status: DC | PRN
  Administered 2022-10-27: 10 mL

## 2022-10-27 MED ORDER — SODIUM CHLORIDE 0.9 % IV SOLN
400.0000 mg/m2 | Freq: Once | INTRAVENOUS | Status: AC
  Administered 2022-10-27: 600 mg via INTRAVENOUS
  Filled 2022-10-27: qty 20

## 2022-10-27 MED ORDER — SODIUM CHLORIDE 0.9 % IV SOLN
200.0000 mg | Freq: Once | INTRAVENOUS | Status: AC
  Administered 2022-10-27: 200 mg via INTRAVENOUS
  Filled 2022-10-27: qty 200

## 2022-10-27 MED ORDER — PROCHLORPERAZINE MALEATE 10 MG PO TABS
10.0000 mg | ORAL_TABLET | Freq: Once | ORAL | Status: AC
  Administered 2022-10-27: 10 mg via ORAL
  Filled 2022-10-27: qty 1

## 2022-10-27 MED ORDER — SODIUM CHLORIDE 0.9 % IV SOLN: Freq: Once | INTRAVENOUS | Status: AC

## 2022-10-27 NOTE — Progress Notes (Signed)
[facility] [phone]   [phone]  OFFICE PROGRESS NOTE  Elie Confer, NP [address] [place] [address]  DIAGNOSIS: Recurrent/metastatic non-small cell lung cancer initially diagnosed as stage IIIB  (T1b, N3, M0) non-small cell lung cancer, adenocarcinoma she presented with right upper lobe nodule in addition to bulky right hilar, mediastinal, and left supraclavicular lymphadenopathy diagnosed in November 2022.  The patient had evidence for disease recurrence in the mediastinal and right supraclavicular lymphadenopathy in August 2023.  The patient had evidence of metastatic disease in February 2024 with several supraclavicular, thoracic and precarinal lymphadenopathy as well as upper abdominal lymph nodes and small liver lesion.  DETECTED ALTERATION(S) / BIOMARKER(S) % CFDNA OR AMPLIFICATION ASSOCIATED FDA-APPROVED THERAPIES CLINICAL TRIAL AVAILABILITY [id number] ND 0.5 5 50 100 4.7%  RHOAG17E ND 0.5 5 50 100 1.8%  CTNNB1S37C ND 0.5 5 50 100 1.9%  BIOMARKER ADDITIONAL DETAILS Tumor Mutational Burden (TMB) 19.02 mut/Mb MSI Status Stable (MSS) PD-L1 Tumor Proportion Score (TPS)* <1%  Biomarker Findings By [facility] on 05/06/2022 Tumor Mutational Burden - 17 Muts/Mb Microsatellite status - MS-Stable Genomic Findings For a complete list of the genes assayed, please refer to the Appendix. CTNNB1 S37C CRKL amplification MAP2K4 loss exons 2-11 TP53 V143A 8 Disease relevant genes with no reportable alterations: ALK, BRAF, EGFR, ERBB2, KRAS, MET, RET, ROS1  PDL1 0%   PRIOR THERAPY:  1) Concurrent chemoradiation with carboplatin for an AUC of 2 and paclitaxel 45 mg per metered squared.  First dose on 01/05/2021.  Status post 7 cycles of treatment.  Last dose was given February 16, 2021. 2) Consolidation immunotherapy with Imfinzi 1500 Mg IV every 4 weeks.  First dose March 25, 2021.  Status post 3 cycles.   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 3) Palliative radiotherapy to the enlarging right supraclavicular lymphadenopathy under the care of Dr. Roselind Messier expected to be completed on October 28, 2021.   CURRENT THERAPY: Palliative systemic chemotherapy with carboplatin for AUC of 5, Alimta 500 Mg/M2 and Keytruda 200 Mg IV every 3 weeks.  First dose April 05, 2022.  Status post 9 cycles.  Starting from cycle #3 her dose of carboplatin was reduced to AUC of 4 and Alimta 400 Mg/M2.  Starting from cycle #5 the patient will be on maintenance treatment with Alimta and Keytruda every 3 weeks.  INTERVAL HISTORY: Julie Nicholson 57 y.o. female returns to the clinic today for follow-up visit.Discussed the use of AI scribe software for clinical note transcription with the patient, who gave verbal consent to proceed.  History of Present Illness   The patient, a 57 year old with Stage 4 non-small cell lung cancer, presents with recurrent chest discomfort and wheezing. She reports having completed a course of Augmentin, which initially alleviated symptoms, but the chest discomfort and wheezing returned a few days later. The patient sought care from their primary care provider, who prescribed another course of antibiotics and a steroid regimen, which has been helping with the symptoms.  The patient also reports intermittent swelling in the neck, which had resolved but started to swell again the day before the consultation. The swelling appears to be fluctuating, with periods of resolution followed by recurrence.  In addition to the lung cancer, the patient has been dealing with frequent bronchitis and sinus issues, which she attributes to bad sinuses and postnasal drainage. She has an upcoming appointment with an allergy specialist to address these issues.  The patient denies experiencing chest pain or shortness of breath since starting the antibiotics. She reports a  persistent cough, which is gradually clearing up, and no hemoptysis. She experiences occasional morning nausea, particularly after taking antibiotics with applesauce, but denies any diarrhea or constipation.  The patient is currently on a maintenance chemotherapy regimen with Alimta and Keytruda, and this is her 10th cycle. She reports no significant side effects from the treatment, apart from the respiratory symptoms. She has been consuming two Ensures a day and eating well, with a slight weight loss of two pounds noted.        MEDICAL HISTORY: Past Medical History:  Diagnosis Date   Abdominal discomfort    Cancer (HCC)    Chronic headaches    due to allergies, sinus   COPD (chronic obstructive pulmonary disease) (HCC)    per 2012 chest xray   pt states she doesn not have this now (04/10/2013)   Deviated nasal septum    Eustachian tube dysfunction    GERD (gastroesophageal reflux disease)    occasional uses Tums / Rolaids   Hearing loss    right ear   High cholesterol    History of radiation therapy    right lung 01/07/2021-02/19/2021  Dr Antony Blackbird   Migraine    "only once in a blue moon since RX'd allergy shots" (04/10/2013)   Pancreatitis 02/08/2013   Pneumonia    Rhinitis, allergic     ALLERGIES:  is allergic to carboplatin.  MEDICATIONS:  Current Outpatient Medications  Medication Sig Dispense Refill   acetaminophen (TYLENOL) 500 MG tablet Take 1,000 mg by mouth every 6 (six) hours as needed for moderate pain.     albuterol (PROVENTIL) (2.5 MG/3ML) 0.083% nebulizer solution albuterol sulfate 2.5 mg/3 mL (0.083 %) solution for nebulization  USE 1 VIAL IN NEBULIZER EVERY 6 HOURS AS NEEDED FOR WHEEZING OR SHORTNESS OF BREATH     albuterol (VENTOLIN HFA) 108 (90 Base) MCG/ACT inhaler Inhale 1-2 puffs into the lungs every 6 (six) hours as needed for wheezing or shortness of breath. 18 g 3   aspirin EC 81 MG tablet Take 81 mg by mouth at bedtime.     atorvastatin (LIPITOR) 40  MG tablet Take 40 mg by mouth at bedtime.     benzonatate (TESSALON) 200 MG capsule Take 1 capsule (200 mg total) by mouth 3 (three) times daily as needed. 30 capsule 0   Biotin 5000 MCG TABS Take 5,000 mcg by mouth at bedtime.     buPROPion (WELLBUTRIN XL) 150 MG 24 hr tablet Take 150 mg by mouth every morning.     Ca Carbonate-Mag Hydroxide (ROLAIDS PO) Take 1 tablet by mouth daily as needed (heartburn).     doxycycline (VIBRAMYCIN) 100 MG capsule Take 100 mg by mouth 2 (two) times daily.     famotidine (PEPCID) 20 MG tablet Take 1 tablet by mouth twice daily 60 tablet 0   fluticasone (FLONASE) 50 MCG/ACT nasal spray Place 2 sprays into both nostrils daily as needed for allergies.     folic acid (FOLVITE) 1 MG tablet Take 1 tablet by mouth once daily 30 tablet 2   gabapentin (NEURONTIN) 300 MG capsule Take 300 mg by mouth.     hydrocortisone 1 % ointment Apply 1 Application topically 2 (two) times daily. 30 g 0   lidocaine-prilocaine (EMLA) cream APPLY TO PORT-A-CATH 30 TO 60 MINUTES BEFORE TREATMENT 30 g 0   linaCLOtide (LINZESS PO) Take by mouth.  loratadine (CLARITIN) 10 MG tablet Take 1 tablet by mouth once daily 30 tablet 0   magic mouthwash SOLN Take 5 mLs by mouth 4 (four) times daily as needed for mouth pain. 240 mL 0   methylPREDNISolone (MEDROL DOSEPAK) 4 MG TBPK tablet Take 20 mg by mouth as directed.     Multiple Vitamins-Minerals (HAIR SKIN AND NAILS FORMULA PO) Take 1 tablet by mouth daily.     ondansetron (ZOFRAN-ODT) 8 MG disintegrating tablet Take 1 tablet (8 mg total) by mouth 3 (three) times daily as needed. 20 tablet 1   Oxycodone HCl 10 MG TABS Take 1 tablet by mouth 3 (three) times daily as needed (pain).     PARoxetine (PAXIL) 10 MG tablet Take 10 mg by mouth daily.     prochlorperazine (COMPAZINE) 10 MG tablet Take 1 tablet (10 mg total) by mouth every 6 (six) hours as needed. 30 tablet 2   rizatriptan (MAXALT-MLT) 10 MG disintegrating tablet Take 10 mg by mouth as  needed for migraine. May repeat in 2 hours if needed     sertraline (ZOLOFT) 50 MG tablet Take 1 tablet by mouth daily.     Tiotropium Bromide-Olodaterol (STIOLTO RESPIMAT) 2.5-2.5 MCG/ACT AERS Inhale 2 puffs into the lungs daily. 12 g 3   No current facility-administered medications for this visit.    SURGICAL HISTORY:  Past Surgical History:  Procedure Laterality Date   ABDOMINAL HYSTERECTOMY  1995   tx endometriosis, both ovaries removed   APPENDECTOMY  late 1990's   BIOPSY  02/26/2021   Procedure: BIOPSY;  Surgeon: Rachael Fee, MD;  Location: [facility];  Service: Endoscopy;;   BRONCHIAL BRUSHINGS  12/15/2020   Procedure: BRONCHIAL BRUSHINGS;  Surgeon: Leslye Peer, MD;  Location: [facility] END[facility]COPY;  Service: Cardiopulmonary;;   BRONCHIAL NEEDLE ASPIRATION BIOPSY  12/15/2020   Procedure: BRONCHIAL NEEDLE ASPIRATION BIOPSIES;  Surgeon: Leslye Peer, MD;  Location: [facility];  Service: Cardiopulmonary;;   CHOLECYSTECTOMY  04/10/2013   CHOLECYSTECTOMY N/A 04/10/2013   Procedure: LAPAROSCOPIC CHOLECYSTECTOMY WITH INTRAOPERATIVE CHOLANGIOGRAM;  Surgeon: Adolph Pollack, MD;  Location: [facility] OR;  Service: General;  Laterality: N/A;   ELECTROMAGNETIC NAVIGATION BROCHOSCOPY  12/15/2020   Procedure: ELECTROMAGNETIC NAVIGATION BRONCHOSCOPY;  Surgeon: Leslye Peer, MD;  Location: [facility] END[facility]COPY;  Service: Cardiopulmonary;;   ESOPHAGOGASTRODUODENOSCOPY (EGD) WITH PROPOFOL N/A 02/26/2021   Procedure: ESOPHAGOGASTRODUODENOSCOPY (EGD) WITH PROPOFOL;  Surgeon: Rachael Fee, MD;  Location: [facility];  Service: Endoscopy;  Laterality: N/A;   EUS N/A 02/16/2013   Procedure: UPPER ENDOSCOPIC ULTRASOUND (EUS) LINEAR;  Surgeon: Rachael Fee, MD;  Location: [facility];  Service: Endoscopy;  Laterality: N/A;  radial linear   IR IMAGING GUIDED PORT INSERTION  06/30/2022   KNEE ARTHROSCOPY Right 1980's   "cartilage OR"   LAPAROSCOPIC ENDOMETRIOSIS FULGURATION  1980's   MYRINGOTOMY WITH  TUBE PLACEMENT Right 07/13/2018   Procedure: MYRINGOTOMY WITH TUBE PLACEMENT;  Surgeon: Vernie Murders, MD;  Location: [facility];  Service: ENT;  Laterality: Right;   MYRINGOTOMY WITH TUBE PLACEMENT Right 06/04/2021   Procedure: MYRINGOTOMY WITH BUTTERFLY TUBE PLACEMENT;  Surgeon: Vernie Murders, MD;  Location: [facility];  Service: ENT;  Laterality: Right;   NASOPHARYNGOSCOPY EUSTATION TUBE BALLOON DILATION Right 07/13/2018   Procedure: NASOPHARYNGOSCOPY EUSTATION TUBE BALLOON DILATION;  Surgeon: Vernie Murders, MD;  Location: [facility];  Service: ENT;  Laterality: Right;   TONSILLECTOMY AND ADENOIDECTOMY  ~ 1980   adenoidectomy   TUBAL LIGATION  ~ 1987  TURBINATE REDUCTION Right 07/13/2018   Procedure: OUTFRACTURE TURBINATE;  Surgeon: Vernie Murders, MD;  Location: [facility];  Service: ENT;  Laterality: Right;   VIDEO BRONCHOSCOPY WITH ENDOBRONCHIAL ULTRASOUND N/A 12/15/2020   Procedure: ROBOTIC VIDEO BRONCHOSCOPY WITH ENDOBRONCHIAL ULTRASOUND;  Surgeon: Leslye Peer, MD;  Location: [facility];  Service: Cardiopulmonary;  Laterality: N/A;   WRIST SURGERY Left    w/plate    REVIEW OF SYSTEMS:  Constitutional: positive for fatigue Eyes: negative Ears, nose, mouth, throat, and face: negative Respiratory: positive for cough Cardiovascular: negative Gastrointestinal: negative Genitourinary:negative Integument/breast: negative Hematologic/lymphatic: negative Musculoskeletal:negative Neurological: negative Behavioral/Psych: negative Endocrine: negative Allergic/Immunologic: negative   PHYSICAL EXAMINATION: General appearance: alert, cooperative, fatigued, and no distress Head: Normocephalic, without obvious abnormality, atraumatic Neck: moderate anterior cervical adenopathy, no JVD, supple, symmetrical, trachea midline, and thyroid not enlarged, symmetric, no tenderness/mass/nodules Lymph nodes: Cervical, supraclavicular, and axillary nodes  normal. Resp: clear to auscultation bilaterally Back: symmetric, no curvature. ROM normal. No CVA tenderness. Cardio: regular rate and rhythm, S1, S2 normal, no murmur, click, rub or gallop GI: soft, non-tender; bowel sounds normal; no masses,  no organomegaly Extremities: extremities normal, atraumatic, no cyanosis or edema Neurologic: Alert and oriented X 3, normal strength and tone. Normal symmetric reflexes. Normal coordination and gait  ECOG PERFORMANCE STATUS: 1 - Symptomatic but completely ambulatory  Blood pressure 122/77, pulse (!) 109, temperature 97.8 F (36.6 C), temperature source Oral, resp. rate 16, height 5\' 2"  (1.575 m), weight 102 lb 6.4 oz (46.4 kg), SpO2 100%.  LABORATORY DATA: Lab Results  Component Value Date   WBC 7.8 10/27/2022   HGB 11.4 (L) 10/27/2022   HCT 33.8 (L) 10/27/2022   MCV 104.3 (H) 10/27/2022   PLT 306 10/27/2022      Chemistry      Component Value Date/Time   NA 140 10/27/2022 0857   K 3.8 10/27/2022 0857   CL 104 10/27/2022 0857   CO2 28 10/27/2022 0857   BUN 27 (H) 10/27/2022 0857   CREATININE 0.89 10/27/2022 0857      Component Value Date/Time   CALCIUM 9.9 10/27/2022 0857   ALKPHOS 130 (H) 10/27/2022 0857   AST 18 10/27/2022 0857   ALT 27 10/27/2022 0857   BILITOT 0.5 10/27/2022 0857       RADIOGRAPHIC STUDIES: No results found.  ASSESSMENT AND PLAN: This is a very pleasant 57 years old white female with stage IIIB (T1b, N3, M0) non-small cell lung cancer, adenocarcinoma diagnosed in November 2022 with no actionable mutation and negative PD-L1 expression. The patient completed a course of concurrent chemoradiation with weekly carboplatin for AUC of 2 and paclitaxel 45 Mg/M2 status post 7 cycles.  She has been tolerating her treatment well except for the mild odynophagia and skin burns. She was also recently admitted to the hospital complaining of dysphagia and odynophagia secondary to radiation induced esophagitis.  She is  feeling much better but continues to have residual dysphagia.  She is followed by gastroenterology and was seen by Dr. Christella Hartigan during her hospitalization. Her scan showed improvement of her disease. I recommended for the patient treatment with consolidation immunotherapy with Imfinzi 1500 Mg IV every 4 weeks.  Status post 3 cycles.  Last dose was given in April 2023.  Her treatment was discontinued secondary to suspicious immunotherapy mediated pneumonitis with significant shortness of breath at that time and she was treated with a tapered dose of prednisone. Unfortunately her scan showed evidence for disease recurrence with enlargement of lower right  cervical lymph nodes as well as mediastinal lymphadenopathy.  She has palpable right cervical lymphadenopathy. 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 The patient underwent ultrasound-guided core biopsy of the right supraclavicular lymph nodes but the final pathology showed necrotic tumor cells with complete coagulative necrosis with focal fibrous tissue and histiocytic reaction.  She also had MRI of the brain that showed no evidence of metastatic disease to the brain. The patient was seen by Dr. Roselind Messier and started palliative radiotherapy to the enlarging right supraclavicular lymphadenopathy.  This was completed on October 28, 2021. The patient was found to have metastatic disease in February 2024 with several lymphadenopathy in the chest as well as supraclavicular, upper abdomen as well as suspicious small liver metastasis. The patient also had ultrasound-guided core biopsy of a left supraclavicular lymph node yesterday and  the final pathology was consistent with metastatic moderate to poorly differentiated adenocarcinoma of the lung primary.  I will [facility] the tissue biopsy to [facility] for molecular studies. She is currently undergoing systemic chemotherapy with carboplatin for AUC of 5, Alimta 500 Mg/M2 and Keytruda 200 Mg IV every 3 weeks status post 9 cycles.  Starting from cycle #3, I reduced her dose of carboplatin to AUC of 4 and Alimta 400 Mg/M2.  Starting from cycle #5 the patient will be on maintenance treatment with Alimta and Keytruda every 3 weeks. The patient has been tolerating this treatment fairly well with no concerning adverse effects. Assessment and Plan    Stage 4 Non-Small Cell Lung Cancer Stable on maintenance chemotherapy with Alimta and Keytruda. No significant side effects reported. -Continue Alimta and Keytruda for cycle 10. -Return in 3 weeks for follow-up and next round of chemotherapy.  Recurrent Bronchitis Recent episodes of chest pain and wheezing, improved with antibiotics and steroids. -Finish current course of antibiotics and steroids. -Continue follow-up with allergy specialist to manage postnasal drainage and frequent bronchitis.  Neck Swelling Recurrent, likely due to inflammation. -Monitor for changes or worsening.  Nausea Occasional, particularly in the mornings. -Monitor and manage symptomatically.  Weight Loss Minimal, patient is eating and drinking Ensures. -Continue current dietary habits and monitor weight.  General Health Maintenance -Continue to manage sinus issues with allergy specialist. -Return in 3 weeks for next round of chemotherapy and follow-up.     The patient was advised to call immediately if she has any concerning symptoms in the interval.  The patient voices understanding of current disease status and treatment options and is in agreement with the current care plan.  All questions were answered. The patient knows to call the clinic with  any problems, questions or concerns. We can certainly see the patient much sooner if necessary.  The total time spent in the appointment was 30 minutes.  Disclaimer: This note was dictated with voice recognition software. Similar sounding words can inadvertently be transcribed and may not be corrected upon review.

## 2022-10-27 NOTE — Progress Notes (Signed)
Patient seen by Dr. Gypsy Balsam are not all within treatment parameters. Heart rate = 109. Per Dr. Arbutus Ped it is ok to treat pt today with  Pemetrexed and pembrolizumab and heart rate of 109.  Labs reviewed: and are within treatment parameters.  Per physician team, patient is ready for treatment and there are NO modifications to the treatment plan.

## 2022-10-27 NOTE — Patient Instructions (Addendum)
[facility]  Discharge Instructions: Thank you for choosing [facility] to provide your oncology and hematology care.   If you have a lab appointment with the Cancer Center, please go directly to the Cancer Center and check in at the registration area.   Wear comfortable clothing and clothing appropriate for easy access to any Portacath or PICC line.   We strive to give you quality time with your provider. You may need to reschedule your appointment if you arrive late (15 or more minutes).  Arriving late affects you and other patients whose appointments are after yours.  Also, if you miss three or more appointments without notifying the office, you may be dismissed from the clinic at the provider's discretion.      For prescription refill requests, have your pharmacy contact our office and allow 72 hours for refills to be completed.    Today you received the following chemotherapy and/or immunotherapy agents: Keytruda/Alimta      To help prevent nausea and vomiting after your treatment, we encourage you to take your nausea medication as directed.  BELOW ARE SYMPTOMS THAT SHOULD BE REPORTED IMMEDIATELY: *FEVER GREATER THAN 100.4 F (38 C) OR HIGHER *CHILLS OR SWEATING *NAUSEA AND VOMITING THAT IS NOT CONTROLLED WITH YOUR NAUSEA MEDICATION *UNUSUAL SHORTNESS OF BREATH *UNUSUAL BRUISING OR BLEEDING *URINARY PROBLEMS (pain or burning when urinating, or frequent urination) *BOWEL PROBLEMS (unusual diarrhea, constipation, pain near the anus) TENDERNESS IN MOUTH AND THROAT WITH OR WITHOUT PRESENCE OF ULCERS (sore throat, sores in mouth, or a toothache) UNUSUAL RASH, SWELLING OR PAIN  UNUSUAL VAGINAL DISCHARGE OR ITCHING   Items with * indicate a potential emergency and should be followed up as soon as possible or go to the Emergency Department if any problems should occur.  Please show the CHEMOTHERAPY ALERT CARD or IMMUNOTHERAPY ALERT CARD at  check-in to the Emergency Department and triage nurse.  Should you have questions after your visit or need to cancel or reschedule your appointment, please contact [facility]  Dept: [phone]  and follow the prompts.  Office hours are 8:00 a.m. to 4:30 p.m. Monday - Friday. Please note that voicemails left after 4:00 p.m. may not be returned until the following business day.  We are closed weekends and major holidays. You have access to a nurse at all times for urgent questions. Please call the main number to the clinic Dept: [phone] and follow the prompts.   For any non-urgent questions, you may also contact your provider using MyChart. We now offer e-Visits for anyone 34 and older to request care online for non-urgent symptoms. For details visit [email or web address].   Also download the MyChart app! Go to the app store, search "MyChart", open the app, select [facility], and log in with your MyChart username and password.

## 2022-11-03 ENCOUNTER — Ambulatory Visit: Payer: Self-pay | Admitting: Internal Medicine

## 2022-11-03 ENCOUNTER — Encounter: Payer: Self-pay | Admitting: Internal Medicine

## 2022-11-05 ENCOUNTER — Other Ambulatory Visit: Payer: Self-pay

## 2022-11-05 ENCOUNTER — Ambulatory Visit (INDEPENDENT_AMBULATORY_CARE_PROVIDER_SITE_OTHER): Payer: Commercial Managed Care - PPO | Admitting: Internal Medicine

## 2022-11-05 ENCOUNTER — Other Ambulatory Visit: Payer: Self-pay | Admitting: Emergency Medicine

## 2022-11-05 ENCOUNTER — Encounter: Payer: Self-pay | Admitting: Internal Medicine

## 2022-11-05 VITALS — BP 118/70 | HR 106 | Temp 98.2°F | Ht 60.63 in | Wt 103.3 lb

## 2022-11-05 DIAGNOSIS — C3491 Malignant neoplasm of unspecified part of right bronchus or lung: Secondary | ICD-10-CM | POA: Diagnosis not present

## 2022-11-05 DIAGNOSIS — J3089 Other allergic rhinitis: Secondary | ICD-10-CM

## 2022-11-05 DIAGNOSIS — J449 Chronic obstructive pulmonary disease, unspecified: Secondary | ICD-10-CM | POA: Diagnosis not present

## 2022-11-05 DIAGNOSIS — J343 Hypertrophy of nasal turbinates: Secondary | ICD-10-CM

## 2022-11-05 MED ORDER — FLUTICASONE PROPIONATE 50 MCG/ACT NA SUSP: 2.0000 | Freq: Every day | NASAL | 5 refills | Status: DC

## 2022-11-05 NOTE — Patient Instructions (Addendum)
Other Allergic Rhinitis: - Use nasal saline rinses before nose sprays such as with Neilmed Sinus Rinse.  Use distilled water.   - Use Flonase 2 sprays each nostril daily. Aim upward and outward. - Use Claritin 10 mg daily. Hold all anti histamines (Claritin, Benadryl, Allegra, Zyrtec etc) 3 days prior to next visit.    Follow up: 11/12/2022 at 8:30 AM with me for skin testing.

## 2022-11-05 NOTE — Progress Notes (Signed)
NEW PATIENT  Date of Service/Encounter:  11/05/22  Consult requested by: Elie Confer, NP   Subjective:   Julie Nicholson (DOB: 10-Feb-1965) is a 57 y.o. female who presents to the clinic on 11/05/2022 with a chief complaint of Allergic Rhinitis  and Establish Care .    History obtained from: chart review and patient.    Rhinitis:  Started around age 83-30s.   Symptoms include:  Sinus headaches, congestion, drainage, runny nose.   Also has trouble with lymphadenopathy with sinus issues; she has stage IV lung cancer which has also resulted in lymphadenopathy and she gets worried whenever new lymph nodes pop up about which is the cause.  Occurs seasonally-Spring/Fall Potential triggers: pollens   Treatments tried:  AIT in the past about 10 years ago. Flonase PRN Claritin daily; last use was last night   Previous allergy testing: yes; years ago  History of sinus surgery: none Nonallergic triggers: none     Also with COPD on Stiolto and Albuterol PRN. Followed by Onc Dr Arbutus Ped on Alimta/Keytruda   Past Medical History: Past Medical History:  Diagnosis Date   Abdominal discomfort    Cancer [facility])    Chronic headaches    due to allergies, sinus   COPD (chronic obstructive pulmonary disease) (HCC)    per 2012 chest xray   pt states she doesn not have this now (04/10/2013)   Deviated nasal septum    Eustachian tube dysfunction    GERD (gastroesophageal reflux disease)    occasional uses Tums / Rolaids   Hearing loss    right ear   High cholesterol    History of radiation therapy    right lung 01/07/2021-02/19/2021  Dr Antony Blackbird   Migraine    "only once in a blue moon since RX'd allergy shots" (04/10/2013)   Pancreatitis 02/08/2013   Pneumonia    Rhinitis, allergic     Past Surgical History: Past Surgical History:  Procedure Laterality Date   ABDOMINAL HYSTERECTOMY  1995   tx endometriosis, both ovaries removed   ADENOIDECTOMY     APPENDECTOMY  late  1990's   BIOPSY  02/26/2021   Procedure: BIOPSY;  Surgeon: Rachael Fee, MD;  Location: [facility] ENDOSCOPY;  Service: Endoscopy;;   BRONCHIAL BRUSHINGS  12/15/2020   Procedure: BRONCHIAL BRUSHINGS;  Surgeon: Leslye Peer, MD;  Location: [facility] ENDOSCOPY;  Service: Cardiopulmonary;;   BRONCHIAL NEEDLE ASPIRATION BIOPSY  12/15/2020   Procedure: BRONCHIAL NEEDLE ASPIRATION BIOPSIES;  Surgeon: Leslye Peer, MD;  Location: [facility] ENDOSCOPY;  Service: Cardiopulmonary;;   CHOLECYSTECTOMY  04/10/2013   CHOLECYSTECTOMY N/A 04/10/2013   Procedure: LAPAROSCOPIC CHOLECYSTECTOMY WITH INTRAOPERATIVE CHOLANGIOGRAM;  Surgeon: Adolph Pollack, MD;  Location: [facility] OR;  Service: General;  Laterality: N/A;   ELECTROMAGNETIC NAVIGATION BROCHOSCOPY  12/15/2020   Procedure: ELECTROMAGNETIC NAVIGATION BRONCHOSCOPY;  Surgeon: Leslye Peer, MD;  Location: [facility] ENDOSCOPY;  Service: Cardiopulmonary;;   ESOPHAGOGASTRODUODENOSCOPY (EGD) WITH PROPOFOL N/A 02/26/2021   Procedure: ESOPHAGOGASTRODUODENOSCOPY (EGD) WITH PROPOFOL;  Surgeon: Rachael Fee, MD;  Location: [facility] ENDOSCOPY;  Service: Endoscopy;  Laterality: N/A;   EUS N/A 02/16/2013   Procedure: UPPER ENDOSCOPIC ULTRASOUND (EUS) LINEAR;  Surgeon: Rachael Fee, MD;  Location: [facility] ENDOSCOPY;  Service: Endoscopy;  Laterality: N/A;  radial linear   IR IMAGING GUIDED PORT INSERTION  06/30/2022   KNEE ARTHROSCOPY Right 1980's   "cartilage OR"   LAPAROSCOPIC ENDOMETRIOSIS FULGURATION  1980's   MYRINGOTOMY WITH TUBE PLACEMENT Right 07/13/2018   Procedure: MYRINGOTOMY WITH  TUBE PLACEMENT;  Surgeon: Vernie Murders, MD;  Location[facility];  Service: ENT;  Laterality: Right;   MYRINGOTOMY WITH TUBE PLACEMENT Right 06/04/2021   Procedure: MYRINGOTOMY WITH BUTTERFLY TUBE PLACEMENT;  Surgeon: Vernie Murders, MD;  Location: [facility];  Service: ENT;  Laterality: Right;   NASOPHARYNGOSCOPY EUSTATION TUBE BALLOON DILATION Right 07/13/2018   Procedure:  NASOPHARYNGOSCOPY EUSTATION TUBE BALLOON DILATION;  Surgeon: Vernie Murders, MD;  Location: [facility];  Service: ENT;  Laterality: Right;   TONSILLECTOMY AND ADENOIDECTOMY  ~ 1980   adenoidectomy   TUBAL LIGATION  ~ 1987   TURBINATE REDUCTION Right 07/13/2018   Procedure: OUTFRACTURE TURBINATE;  Surgeon: Vernie Murders, MD;  Location: [facility];  Service: ENT;  Laterality: Right;   TYMPANOSTOMY TUBE PLACEMENT     VIDEO BRONCHOSCOPY WITH ENDOBRONCHIAL ULTRASOUND N/A 12/15/2020   Procedure: ROBOTIC VIDEO BRONCHOSCOPY WITH ENDOBRONCHIAL ULTRASOUND;  Surgeon: Leslye Peer, MD;  Location: [facility] ENDOSCOPY;  Service: Cardiopulmonary;  Laterality: N/A;   WRIST SURGERY Left    w/plate    Family History: Family History  Problem Relation Age of Onset   Diabetes Father    Diabetes Mother    COPD Mother    COPD Maternal Grandfather    COPD Maternal Aunt    COPD Maternal Uncle     Social History:  Flooring in bedroom: carpet Pets: dog Tobacco use/exposure: quit 12/2020; previously with 43 pack year hx  Job: disabled   Medication List:  Allergies as of 11/05/2022       Reactions   Carboplatin Shortness Of Breath, Itching, Cough   See progress note from 06/16/22        Medication List        Accurate as of November 05, 2022  9:08 AM. If you have any questions, ask your nurse or doctor.          acetaminophen 500 MG tablet Commonly known as: TYLENOL Take 1,000 mg by mouth every 6 (six) hours as needed for moderate pain.   albuterol (2.5 MG/3ML) 0.083% nebulizer solution Commonly known as: PROVENTIL albuterol sulfate 2.5 mg/3 mL (0.083 %) solution for nebulization  USE 1 VIAL IN NEBULIZER EVERY 6 HOURS AS NEEDED FOR WHEEZING OR SHORTNESS OF BREATH   albuterol 108 (90 Base) MCG/ACT inhaler Commonly known as: VENTOLIN HFA Inhale 1-2 puffs into the lungs every 6 (six) hours as needed for wheezing or shortness of breath.   aspirin EC 81 MG tablet Take 81 mg by  mouth at bedtime.   atorvastatin 40 MG tablet Commonly known as: LIPITOR Take 40 mg by mouth at bedtime.   benzonatate 200 MG capsule Commonly known as: TESSALON Take 1 capsule (200 mg total) by mouth 3 (three) times daily as needed.   Biotin 5000 MCG Tabs Take 5,000 mcg by mouth at bedtime.   buPROPion 150 MG 24 hr tablet Commonly known as: WELLBUTRIN XL Take 150 mg by mouth every morning.   doxycycline 100 MG capsule Commonly known as: VIBRAMYCIN Take 100 mg by mouth 2 (two) times daily.   famotidine 20 MG tablet Commonly known as: PEPCID Take 1 tablet by mouth twice daily   fluticasone 50 MCG/ACT nasal spray Commonly known as: FLONASE Place 2 sprays into both nostrils daily as needed for allergies.   folic acid 1 MG tablet Commonly known as: FOLVITE Take 1 tablet by mouth once daily   gabapentin 300 MG capsule Commonly known as: NEURONTIN Take 300 mg by mouth.   HAIR  SKIN AND NAILS FORMULA PO Take 1 tablet by mouth daily.   hydrocortisone 1 % ointment Apply 1 Application topically 2 (two) times daily.   lidocaine-prilocaine cream Commonly known as: EMLA APPLY TO PORT-A-CATH 30 TO 60 MINUTES BEFORE TREATMENT   LINZESS PO Take by mouth.   loratadine 10 MG tablet Commonly known as: CLARITIN Take 1 tablet by mouth once daily   magic mouthwash Soln Take 5 mLs by mouth 4 (four) times daily as needed for mouth pain.   methylPREDNISolone 4 MG Tbpk tablet Commonly known as: MEDROL DOSEPAK Take 20 mg by mouth as directed.   ondansetron 8 MG disintegrating tablet Commonly known as: ZOFRAN-ODT Take 1 tablet (8 mg total) by mouth 3 (three) times daily as needed.   Oxycodone HCl 10 MG Tabs Take 1 tablet by mouth 3 (three) times daily as needed (pain).   PARoxetine 10 MG tablet Commonly known as: PAXIL Take 10 mg by mouth daily.   prochlorperazine 10 MG tablet Commonly known as: COMPAZINE Take 1 tablet (10 mg total) by mouth every 6 (six) hours as  needed.   rizatriptan 10 MG disintegrating tablet Commonly known as: MAXALT-MLT Take 10 mg by mouth as needed for migraine. May repeat in 2 hours if needed   ROLAIDS PO Take 1 tablet by mouth daily as needed (heartburn).   sertraline 50 MG tablet Commonly known as: ZOLOFT Take 1 tablet by mouth daily.   Stiolto Respimat 2.5-2.5 MCG/ACT Aers Generic drug: Tiotropium Bromide-Olodaterol Inhale 2 puffs into the lungs daily.         REVIEW OF SYSTEMS: Pertinent positives and negatives discussed in HPI.   Objective:   Physical Exam: BP 118/70   Pulse (!) 106   Temp 98.2 F (36.8 C) (Temporal)   Ht 5' 0.63" (1.54 m)   Wt 103 lb 4.8 oz (46.9 kg)   SpO2 98%   BMI 19.76 kg/m  Body mass index is 19.76 kg/m. GEN: alert, well developed HEENT: clear conjunctiva, TM grey and translucent, nose with + mild inferior turbinate hypertrophy, pink nasal mucosa, slight clear rhinorrhea, + cobblestoning HEART: regular rate and rhythm, no murmur LUNGS: + diffuse end inspiratory rhonchi/wheezing, no coughing, unlabored respiration ABDOMEN: soft, non distended  SKIN: no rashes or lesions  Reviewed:  09/23/2022: seen by Dr Pollyann Kennedy ENT [facility] for hearing loss, ear issues. Also with allergic rhinitis. Referred to Allergy.  10/27/2022: seen by Dr. 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  Currently on systemic therapy.   05/21/2022: followed by Erline Hau for COPD, lung cancer, allergic rhinitis, hx of tobacco use.  On Flonase, Claritin, PRN albuterol.   Assessment:   1. Other allergic rhinitis   2. Nasal turbinate hypertrophy   3. Chronic obstructive pulmonary disease, unspecified COPD type (HCC)   4. Adenocarcinoma of right lung, stage 4 (HCC)     Plan/Recommendations:  Other Allergic Rhinitis: - Due to turbinate hypertrophy, seasonal symptoms and unresponsive to over the counter meds, will perform skin testing to identify aeroallergen triggers  at next visit.  - Avoidance measures discussed. - Use nasal saline rinses before nose sprays such as with Neilmed Sinus Rinse.  Use distilled water.   - Use Flonase 2 sprays each nostril daily. Aim upward and outward. - Use Claritin 10 mg daily. Hold all anti histamines (Claritin, Benadryl, Allegra, Zyrtec etc) 3 days prior to next visit.  - Discussed she is not an AIT candidate with her COPD and stage IV lung  cancer on active chemotherapy.   COPD - Follow up with Pulm regarding inhaler management.  Currently on Stiolto and Albuterol PRN.   Follow up: 11/12/2022 at 8:30 AM with me for skin testing.     Return in about 1 week (around 11/12/2022).  Alesia Morin, MD [facility]

## 2022-11-12 ENCOUNTER — Encounter: Payer: Self-pay | Admitting: Internal Medicine

## 2022-11-12 ENCOUNTER — Other Ambulatory Visit: Payer: Self-pay

## 2022-11-12 ENCOUNTER — Ambulatory Visit: Payer: Commercial Managed Care - PPO | Admitting: Internal Medicine

## 2022-11-12 VITALS — BP 104/70 | HR 106 | Temp 97.9°F | Resp 18

## 2022-11-12 DIAGNOSIS — J3089 Other allergic rhinitis: Secondary | ICD-10-CM

## 2022-11-12 MED ORDER — AZELASTINE HCL 0.1 % NA SOLN: 1.0000 | Freq: Two times a day (BID) | NASAL | 5 refills | Status: DC | PRN

## 2022-11-12 MED ORDER — CETIRIZINE HCL 10 MG PO TABS: 10.0000 mg | ORAL_TABLET | Freq: Every day | ORAL | 5 refills | Status: DC | PRN

## 2022-11-12 MED ORDER — AZELASTINE HCL 0.1 % NA SOLN: 1.0000 | Freq: Two times a day (BID) | NASAL | 5 refills | Status: AC | PRN

## 2022-11-12 NOTE — Patient Instructions (Addendum)
Other Allergic Rhinitis: - Positive skin test 11/2022: none - Use nasal saline rinses before nose sprays such as with Neilmed Sinus Rinse.  Use distilled water.   - Use Flonase 2 sprays each nostril daily. Aim upward and outward. - Use Azelastine 1-2 sprays each nostril twice daily as needed for runny nose, drainage, sneezing, congestion. Aim upward and outward. - Use Zyrtec 10mg  daily as needed for runny nose; Try this instead of Claritin.

## 2022-11-12 NOTE — Addendum Note (Signed)
Addended by: Orson Aloe on: 11/12/2022 09:23 AM   Modules accepted: Orders

## 2022-11-12 NOTE — Progress Notes (Signed)
[facility] OFFICE PROGRESS NOTE  Julie Nicholson, [name], NP [address] [place] [address]  DIAGNOSIS: Recurrent/metastatic non-small cell lung cancer initially diagnosed as stage IIIB  (T1b, N3, M0) non-small cell lung cancer, adenocarcinoma she presented with right upper lobe nodule in addition to bulky right hilar, mediastinal, and left supraclavicular lymphadenopathy diagnosed in November 2022.  The patient had evidence for disease recurrence in the mediastinal and right supraclavicular lymphadenopathy in August 2023.  The patient had evidence of metastatic disease in February 2024 with several supraclavicular, thoracic and precarinal lymphadenopathy as well as upper abdominal lymph nodes and small liver lesion.   DETECTED ALTERATION(S) / BIOMARKER(S)% CFDNA OR AMPLIFICATIONASSOCIATED FDA-APPROVED THERAPIESCLINICAL TRIAL AVAILABILITY [id number] ND 0.5 5 50 100 4.7%   RHOAG17E ND 0.5 5 50 100 1.8%   CTNNB1S37C ND 0.5 5 50 100 1.9%   BIOMARKERADDITIONAL DETAILS Tumor Mutational Burden (TMB) 19.02 mut/Mb MSI Status Stable (MSS) PD-L1 Tumor Proportion Score (TPS)* <1%   Biomarker Findings By [facility] on 05/06/2022 Tumor Mutational Burden - 17 Muts/Mb Microsatellite status - MS-Stable Genomic Findings For a complete list of the genes assayed, please refer to the Appendix. CTNNB1 S37C CRKL amplification MAP2K4 loss exons 2-11 TP53 V143A 8 Disease relevant genes with no reportable alterations: ALK, BRAF, EGFR, ERBB2, KRAS, MET, RET, ROS1   PDL1 0%  PRIOR THERAPY: 1) Concurrent chemoradiation with carboplatin for an AUC of 2 and paclitaxel 45 mg per metered squared.  First dose on 01/05/2021.  Status post 7 cycles of treatment.  Last dose was given February 16, 2021. 2) Consolidation immunotherapy with Imfinzi 1500 Mg IV every 4 weeks.  First dose March 25, 2021.  Status post 3 cycles.  This was discontinued secondary to suspicious  immunotherapy mediated pneumonitis. 3) Palliative radiotherapy to the enlarging right supraclavicular lymphadenopathy under the care of Dr. Roselind Nicholson expected to be completed on October 28, 2021.  CURRENT THERAPY:  Palliative systemic chemotherapy with carboplatin for AUC of 5, Alimta 500 Mg/M2 and Keytruda 200 Mg IV every 3 weeks.  First dose April 05, 2022.  Status post 10 cycles.  Starting from cycle #3 her dose of carboplatin was reduced to AUC of 4 and Alimta 400 Mg/M2.  Starting from cycle #5 the patient will be on maintenance treatment with Alimta and Keytruda every 3 weeks.   INTERVAL HISTORY: Julie Nicholson 57 y.o. female returns to the clinic today for a follow up visit. The patient was last seen 3 weeks ago by Dr. Arbutus Nicholson. She is currently undergoing chemotherapy and immunotherapy for her lung cancer. She tolerates it well.   Since last being seen, she was seen by an allergist for her sinus concerns, post nasal drainage/nasal congestion. They recommended zyrtec, azelastine, and flonase in addition to Neilmed sinus rinse.   She is feeling fair today except for fatigue. She has a little more nasal congestion today. She has a 73 year old grandchild that she takes care of who also has nasal congestion. Otherwise, she denies associated fevers, chills, changes with baseline shortness of breath, changes with baseline dry cough, or sore throat.   Otherwise she denies any recent chills. She gets night sweats on and off at baseline.  According her baseline shortness of breath, she sometimes has associated chest tightness for which she uses an inhaler which helps.  Denies any hemoptysis.  She sometimes may have nausea at nighttime.  Overall she states she does not have nausea very often.  She has an  antiemetic.  Denies any vomiting, diarrhea, or constipation.  She has headaches "now and then" but this is not any different from her baseline. The enlarged lymph node on her neck waxes and wanes with  swelling. It is a little more swollen this morning than prior.   Denies any rashes or skin changes.  She is here today for evaluation repeat blood work before undergoing cycle #12.   MEDICAL HISTORY: Past Medical History:  Diagnosis Date   Abdominal discomfort    Cancer (HCC)    Chronic headaches    due to allergies, sinus   COPD (chronic obstructive pulmonary disease) (HCC)    per 2012 chest xray   pt states she doesn not have this now (04/10/2013)   Deviated nasal septum    Eustachian tube dysfunction    GERD (gastroesophageal reflux disease)    occasional uses Tums / Rolaids   Hearing loss    right ear   High cholesterol    History of radiation therapy    right lung 01/07/2021-02/19/2021  Dr Julie Nicholson   Migraine    "only once in a blue moon since RX'd allergy shots" (04/10/2013)   Pancreatitis 02/08/2013   Pneumonia    Rhinitis, allergic     ALLERGIES:  is allergic to carboplatin.  MEDICATIONS:  Current Outpatient Medications  Medication Sig Dispense Refill   acetaminophen (TYLENOL) 500 MG tablet Take 1,000 mg by mouth every 6 (six) hours as needed for moderate pain.     albuterol (PROVENTIL) (2.5 MG/3ML) 0.083% nebulizer solution albuterol sulfate 2.5 mg/3 mL (0.083 %) solution for nebulization  USE 1 VIAL IN NEBULIZER EVERY 6 HOURS AS NEEDED FOR WHEEZING OR SHORTNESS OF BREATH     albuterol (VENTOLIN HFA) 108 (90 Base) MCG/ACT inhaler Inhale 1-2 puffs into the lungs every 6 (six) hours as needed for wheezing or shortness of breath. 18 g 3   aspirin EC 81 MG tablet Take 81 mg by mouth at bedtime.     atorvastatin (LIPITOR) 40 MG tablet Take 40 mg by mouth at bedtime.     azelastine (ASTELIN) 0.1 % nasal spray Place 1 spray into both nostrils 2 (two) times daily as needed. Use in each nostril as directed 30 mL 5   benzonatate (TESSALON) 200 MG capsule Take 1 capsule (200 mg total) by mouth 3 (three) times daily as needed. 30 capsule 0   Biotin 5000 MCG TABS Take 5,000 mcg  by mouth at bedtime.     buPROPion (WELLBUTRIN XL) 150 MG 24 hr tablet Take 150 mg by mouth every morning.     Ca Carbonate-Mag Hydroxide (ROLAIDS PO) Take 1 tablet by mouth daily as needed (heartburn).     cetirizine (ZYRTEC ALLERGY) 10 MG tablet Take 1 tablet (10 mg total) by mouth daily as needed for rhinitis. 30 tablet 5   doxycycline (VIBRAMYCIN) 100 MG capsule Take 100 mg by mouth 2 (two) times daily.     famotidine (PEPCID) 20 MG tablet Take 1 tablet by mouth twice daily 60 tablet 0   fluticasone (FLONASE) 50 MCG/ACT nasal spray Place 2 sprays into both nostrils daily. 16 g 5   folic acid (FOLVITE) 1 MG tablet Take 1 tablet by mouth once daily 30 tablet 2   gabapentin (NEURONTIN) 300 MG capsule Take 300 mg by mouth. (Patient not taking: Reported on 11/05/2022)     hydrocortisone 1 % ointment Apply 1 Application topically 2 (two) times daily. 30 g 0   lidocaine-prilocaine (EMLA) cream APPLY  TO PORT-A-CATH 30 TO 60 MINUTES BEFORE TREATMENT 30 g 0   linaCLOtide (LINZESS PO) Take by mouth.     loratadine (CLARITIN) 10 MG tablet Take 1 tablet by mouth once daily 30 tablet 11   magic mouthwash SOLN Take 5 mLs by mouth 4 (four) times daily as needed for mouth pain. 240 mL 0   methylPREDNISolone (MEDROL DOSEPAK) 4 MG TBPK tablet Take 20 mg by mouth as directed. (Patient not taking: Reported on 11/05/2022)     Multiple Vitamins-Minerals (HAIR SKIN AND NAILS FORMULA PO) Take 1 tablet by mouth daily.     ondansetron (ZOFRAN-ODT) 8 MG disintegrating tablet Take 1 tablet (8 mg total) by mouth 3 (three) times daily as needed. 20 tablet 1   Oxycodone HCl 10 MG TABS Take 1 tablet by mouth 3 (three) times daily as needed (pain).     PARoxetine (PAXIL) 10 MG tablet Take 10 mg by mouth daily.     prochlorperazine (COMPAZINE) 10 MG tablet Take 1 tablet (10 mg total) by mouth every 6 (six) hours as needed. 30 tablet 2   rizatriptan (MAXALT-MLT) 10 MG disintegrating tablet Take 10 mg by mouth as needed for  migraine. May repeat in 2 hours if needed     sertraline (ZOLOFT) 50 MG tablet Take 1 tablet by mouth daily.     Tiotropium Bromide-Olodaterol (STIOLTO RESPIMAT) 2.5-2.5 MCG/ACT AERS Inhale 2 puffs into the lungs daily. 12 g 3   No current facility-administered medications for this visit.    SURGICAL HISTORY:  Past Surgical History:  Procedure Laterality Date   ABDOMINAL HYSTERECTOMY  1995   tx endometriosis, both ovaries removed   ADENOIDECTOMY     APPENDECTOMY  late 1990's   BIOPSY  02/26/2021   Procedure: BIOPSY;  Surgeon: Rachael Fee, MD;  Location: [facility] ENDOSCOPY;  Service: Endoscopy;;   BRONCHIAL BRUSHINGS  12/15/2020   Procedure: BRONCHIAL BRUSHINGS;  Surgeon: Leslye Peer, MD;  Location: [facility]COPY;  Service: Cardiopulmonary;;   BRONCHIAL NEEDLE ASPIRATION BIOPSY  12/15/2020   Procedure: BRONCHIAL NEEDLE ASPIRATION BIOPSIES;  Surgeon: Leslye Peer, MD;  Location: [facility] ENDOSCOPY;  Service: Cardiopulmonary;;   CHOLECYSTECTOMY  04/10/2013   CHOLECYSTECTOMY N/A 04/10/2013   Procedure: LAPAROSCOPIC CHOLECYSTECTOMY WITH INTRAOPERATIVE CHOLANGIOGRAM;  Surgeon: Adolph Pollack, MD;  Location: [facility] OR;  Service: General;  Laterality: N/A;   ELECTROMAGNETIC NAVIGATION BROCHOSCOPY  12/15/2020   Procedure: ELECTROMAGNETIC NAVIGATION BRONCHOSCOPY;  Surgeon: Leslye Peer, MD;  Location: [facility] ENDOSCOPY;  Service: Cardiopulmonary;;   ESOPHAGOGASTRODUODENOSCOPY (EGD) WITH PROPOFOL N/A 02/26/2021   Procedure: ESOPHAGOGASTRODUODENOSCOPY (EGD) WITH PROPOFOL;  Surgeon: Rachael Fee, MD;  Location: [facility] ENDOSCOPY;  Service: Endoscopy;  Laterality: N/A;   EUS N/A 02/16/2013   Procedure: UPPER ENDOSCOPIC ULTRASOUND (EUS) LINEAR;  Surgeon: Rachael Fee, MD;  Location: [facility] ENDOSCOPY;  Service: Endoscopy;  Laterality: N/A;  radial linear   IR IMAGING GUIDED PORT INSERTION  06/30/2022   KNEE ARTHROSCOPY Right 1980's   "cartilage OR"   LAPAROSCOPIC ENDOMETRIOSIS FULGURATION  1980's    MYRINGOTOMY WITH TUBE PLACEMENT Right 07/13/2018   Procedure: MYRINGOTOMY WITH TUBE PLACEMENT;  Surgeon: Vernie Murders, MD;  Location: [facility]ERY [facility]TR;  Service: ENT;  Laterality: Right;   MYRINGOTOMY WITH TUBE PLACEMENT Right 06/04/2021   Procedure: MYRINGOTOMY WITH BUTTERFLY TUBE PLACEMENT;  Surgeon: Vernie Murders, MD;  [facility];  Service: ENT;  Laterality: Right;   NASOPHARYNGOSCOPY EUSTATION TUBE BALLOON DILATION Right 07/13/2018   Procedure: NASOPHARYNGOSCOPY EUSTATION TUBE BALLOON DILATION;  Surgeon:  Vernie Murders, MD;  Location: [facility] SUR[facility]ERY [facility];  Service: ENT;  Laterality: Right;   TONSILLECTOMY AND ADENOIDECTOMY  ~ 1980   adenoidectomy   TUBAL LIGATION  ~ 1987   TURBINATE REDUCTION Right 07/13/2018   Procedure: OUTFRACTURE TURBINATE;  Surgeon: Vernie Murders, MD;  Location: [facility] SURGERY [facility]TR;  Service: ENT;  Laterality: Right;   TYMPANOSTOMY TUBE PLACEMENT     VIDEO BRONCHOSCOPY WITH ENDOBRONCHIAL ULTRASOUND N/A 12/15/2020   Procedure: ROBOTIC VIDEO BRONCHOSCOPY WITH ENDOBRONCHIAL ULTRASOUND;  Surgeon: Leslye Peer, MD;  Location: [facility] ENDOSCOPY;  Service: Cardiopulmonary;  Laterality: N/A;   WRIST SURGERY Left    w/plate    REVIEW OF SYSTEMS:   Review of Systems  Constitutional: Positive for fatigue. Negative for appetite change, chills, fever and unexpected weight change.  HENT: Positive for nasal congestion. Negative for mouth sores, nosebleeds, sore throat and trouble swallowing.   Eyes: Negative for eye problems and icterus.  Respiratory: Positive for baseline dyspnea on exertion and stable occasional dry cough. Negative for hemoptysis and wheezing.   Cardiovascular: Negative for chest pain and leg swelling.  Gastrointestinal: Positive for mild intermittent nausea. Negative for abdominal pain, constipation, diarrhea, and vomiting.  Genitourinary: Negative for bladder incontinence, difficulty urinating, dysuria, frequency and hematuria.    Musculoskeletal: Negative for back pain, gait problem, neck pain and neck stiffness.  Skin: Negative for itching and rash.  Neurological: Negative for dizziness, extremity weakness, gait problem, headaches, light-headedness and seizures.  Hematological: Positive for left supraclavicular adenopathy. Does not bruise/bleed easily.  Psychiatric/Behavioral: Negative for confusion, depression and sleep disturbance. The patient is not nervous/anxious.     PHYSICAL EXAMINATION:  There were no vitals taken for this visit.  ECOG PERFORMANCE STATUS: 1  Physical Exam  Constitutional: Oriented to person, place, and time and well-developed, well-nourished, and in no distress.  HENT:  Head: Normocephalic and atraumatic.  Mouth/Throat: Oropharynx is clear and moist. No oropharyngeal exudate.  Eyes: Conjunctivae are normal. Right eye exhibits no discharge. Left eye exhibits no discharge. No scleral icterus.  Neck: Normal range of motion. Neck supple.  Cardiovascular: Normal rate, regular rhythm, normal heart sounds and intact distal pulses.   Pulmonary/Chest: Effort normal and breath sounds normal. No respiratory distress. No wheezes. No rales.  Abdominal: Soft. Bowel sounds are normal. Exhibits no distension and no mass. There is no tenderness.  Musculoskeletal: Normal range of motion. Exhibits no edema.  Lymphadenopathy:    Left supraclavicular adenopathy. Sore soreness to touch.  Neurological: Alert and oriented to person, place, and time. Exhibits normal muscle tone. Gait normal. Coordination normal.  Skin: Skin is warm and dry. No rash noted. Not diaphoretic. No erythema. No pallor.  Psychiatric: Mood, memory and judgment normal.  Vitals reviewed.  LABORATORY DATA: Lab Results  Component Value Date   WBC 7.8 10/27/2022   HGB 11.4 (L) 10/27/2022   HCT 33.8 (L) 10/27/2022   MCV 104.3 (H) 10/27/2022   PLT 306 10/27/2022      Chemistry      Component Value Date/Time   NA 140 10/27/2022  0857   K 3.8 10/27/2022 0857   CL 104 10/27/2022 0857   CO2 28 10/27/2022 0857   BUN 27 (H) 10/27/2022 0857   CREATININE 0.89 10/27/2022 0857      Component Value Date/Time   CALCIUM 9.9 10/27/2022 0857   ALKPHOS 130 (H) 10/27/2022 0857   AST 18 10/27/2022 0857   ALT 27 10/27/2022 0857   BILITOT 0.5 10/27/2022 0857  RADIOGRAPHIC STUDIES:  No results found.   ASSESSMENT/PLAN:  This is a very pleasant 57 year old Caucasian female with recurrent lung cancer initially diagnosed as stage IIIB (T1b, N3, M0) non-small cell lung cancer, adenocarcinoma.  She presented with a right upper lobe spiculated nodule and right hilar, mediastinal, and left supraclavicular adenopathy.  She was diagnosed in November 2022.   Therefore, the patient had guardant 360 performed which showed no actionable mutations.  Her PD-L1 expression is less than 1%.  She was found to have metastatic disease in February 2024   The patient completed a course of concurrent chemoradiation with carboplatin for an AUC of 2 and paclitaxel 45 mg per metered square.  She status post 7 cycles.  She had some significant odynophagia and dysphagia from treatment.   She was seen by Dr. Christella Hartigan while admitted to the hospital and a EGD was performed which showed single 1cm oval shaped very shallow ulceration.  She recovered.  She then had consolidation immunotherapy with Imfinzi 1500 mg IV every 4 weeks.  She is status post 3 cycles.  She developed suspicious immunotherapy mediated pneumonitis and this was ultimately discontinued.  The patient was seen by Dr. Roselind Nicholson and started palliative radiotherapy to the enlarging right supraclavicular lymphadenopathy. This was completed on October 28, 2021.    The patient was found to have metastatic disease in February 2024 with several lymphadenopathy in the chest as well as supraclavicular, upper abdomen as well as suspicious small liver metastasis. The patient also had ultrasound-guided  core biopsy of a left supraclavicular lymph node yesterday and the final pathology was consistent with metastatic moderate to poorly differentiated adenocarcinoma of the lung primary.   She is currently undergoing systemic chemotherapy with carboplatin for AUC of 5, Alimta 500 Mg/M2 and Keytruda 200 Mg IV every 3 weeks status post 10 cycles.  Starting from cycle #3, Dr. Arbutus Nicholson reduced her dose of carboplatin to AUC of 4 and Alimta 400 Mg/M2. Starting from cycle #5 the patient started on maintenance treatment with Alimta and Keytruda every 3 weeks.     Labs were reviewed.  Recommend that she proceed with cycle #11 today scheduled.  I will arrange for restaging CT scan of chest, abdomen, pelvis, and neck prior to her next cycle of treatment which we will review at her next appointment.  We will see her back for follow-up visit in 3 weeks for evaluation repeat blood work before undergoing cycle number 12.  She will continue with zyrtec, Flonase, and azelastine for her congestion. She denies fevers, sore throat, worsening shortness of breath, or changes with her chronic dry cough. No leukocytosis on labs. Vitals are stable. She feels well to proceed with treatment as scheduled but knows to call if she develops new signs and symptoms of infection.   She would like a flu shot. She will get this while in the infusion room. She will get her COVID-19 vaccine on her off week of treatment.   The patient was advised to call immediately if she has any concerning symptoms in the interval. The patient voices understanding of current disease status and treatment options and is in agreement with the current care plan. All questions were answered. The patient knows to call the clinic with any problems, questions or concerns. We can certainly see the patient much sooner if necessary    No orders of the defined types were placed in this encounter.    The total time spent in the appointment was 20-29  minutes  Julie Geisel L Frederich Montilla, PA-C 11/12/22

## 2022-11-12 NOTE — Progress Notes (Signed)
FOLLOW UP Date of Service/Encounter:  11/12/22   Subjective:  Julie Nicholson (DOB: 1965/11/13) is a 57 y.o. female who [facility] for follow up for skin testing.   History obtained from: chart review and patient.  Anti histamines held.   Past Medical History: Past Medical History:  Diagnosis Date   Abdominal discomfort    Cancer (HCC)    Chronic headaches    due to allergies, sinus   COPD (chronic obstructive pulmonary disease) (HCC)    per 2012 chest xray   pt states she doesn not have this now (04/10/2013)   Deviated nasal septum    Eustachian tube dysfunction    GERD (gastroesophageal reflux disease)    occasional uses Tums / Rolaids   Hearing loss    right ear   High cholesterol    History of radiation therapy    right lung 01/07/2021-02/19/2021  Dr Antony Blackbird   Migraine    "only once in a blue moon since RX'd allergy shots" (04/10/2013)   Pancreatitis 02/08/2013   Pneumonia    Rhinitis, allergic     Objective:  BP 104/70 (BP Location: Left Arm, Patient Position: Sitting, Cuff Size: Normal)   Pulse (!) 106   Temp 97.9 F (36.6 C) (Temporal)   Resp 18   SpO2 98%  There is no height or weight on file to calculate BMI. Physical Exam: GEN: alert, well developed HEENT: clear conjunctiva, MMM HEART: regular rate LUNGS: no coughing, unlabored respiration SKIN: no rashes or lesions  Skin Testing:  Skin prick testing was placed, which includes aeroallergens/foods, histamine control, and saline control.  Verbal consent was obtained prior to placing test.  Patient tolerated procedure well.  Allergy testing results were read and interpreted by myself, documented by clinical staff. Adequate positive and negative control.  Positive results to:  Results discussed with patient/family.  Airborne Adult Perc - 11/12/22 0843     Time Antigen Placed 0843    Allergen Manufacturer Waynette Buttery    Location Back    Number of Test 55     1. Control-Buffer 50% Glycerol Negative    2. Control-Histamine 3+    3. Bahia Negative    4. [place] Negative    5. Johnson Negative    6. Kentucky Blue Negative    7. Meadow Fescue Negative    8. Perennial Rye Negative    9. Timothy Negative    10. Ragweed Mix Negative    11. Cocklebur Negative    12. Plantain,  English Negative    13. Baccharis Negative    14. Dog Fennel Negative    15. Russian Thistle Negative    16. Lamb's Quarters Negative    17. Sheep Sorrell Negative    18. Rough Pigweed Negative    19. Marsh Elder, Rough Negative    20. Mugwort, Common Negative    21. Box, Elder Negative    22. Cedar, red Negative    23. Sweet Gum Negative    24. Pecan Pollen Negative    25. Pine Mix Negative    26. Walnut, Black Pollen Negative    27. Red Mulberry Negative    28. Ash Mix Negative    29. Birch Mix Negative    30. Beech American Negative    31. Cottonwood, Guinea-Bissau Negative    32. Hickory, White Negative    33. Maple Mix Negative    34. Oak, Guinea-Bissau Mix Negative  35. Sycamore Eastern Negative    36. Alternaria Alternata Negative    37. Cladosporium Herbarum Negative    38. Aspergillus Mix Negative    39. Penicillium Mix Negative    40. Bipolaris Sorokiniana (Helminthosporium) Negative    41. Drechslera Spicifera (Curvularia) Negative    42. Mucor Plumbeus Negative    43. Fusarium Moniliforme Negative    44. Aureobasidium Pullulans (pullulara) Negative    45. Rhizopus Oryzae Negative    46. Botrytis Cinera Negative    47. Epicoccum Nigrum Negative    48. Phoma Betae Negative    49. Dust Mite Mix Negative    50. Cat Hair 10,000 BAU/ml Negative    51.  Dog Epithelia Negative    52. Mixed Feathers Negative    53. Horse Epithelia Negative    54. Cockroach, German Negative    55. Tobacco Leaf Negative              Assessment:   1. Other allergic rhinitis     Plan/Recommendations:  Other Allergic Rhinitis: - Due to turbinate hypertrophy,  seasonal symptoms and unresponsive to over the counter meds, will perform skin testing to identify aeroallergen triggers.   - Positive skin test 11/2022: none - Use nasal saline rinses before nose sprays such as with Neilmed Sinus Rinse.  Use distilled water.   - Use Flonase 2 sprays each nostril daily. Aim upward and outward. - Use Azelastine 1-2 sprays each nostril twice daily as needed for runny nose, drainage, sneezing, congestion. Aim upward and outward. - Use Zyrtec 10mg  daily as needed for runny nose; Try this instead of Claritin.      Return in about 3 months (around 02/12/2023).  Alesia Morin, MD [facility]

## 2022-11-13 ENCOUNTER — Other Ambulatory Visit: Payer: Self-pay | Admitting: Physician Assistant

## 2022-11-13 ENCOUNTER — Other Ambulatory Visit: Payer: Self-pay

## 2022-11-13 DIAGNOSIS — C3411 Malignant neoplasm of upper lobe, right bronchus or lung: Secondary | ICD-10-CM

## 2022-11-17 ENCOUNTER — Inpatient Hospital Stay: Payer: Commercial Managed Care - PPO

## 2022-11-17 ENCOUNTER — Inpatient Hospital Stay (HOSPITAL_BASED_OUTPATIENT_CLINIC_OR_DEPARTMENT_OTHER): Payer: Commercial Managed Care - PPO | Admitting: Physician Assistant

## 2022-11-17 ENCOUNTER — Inpatient Hospital Stay: Payer: Commercial Managed Care - PPO | Attending: Internal Medicine

## 2022-11-17 VITALS — BP 118/68 | HR 98 | Temp 97.6°F | Resp 17 | Wt 104.8 lb

## 2022-11-17 VITALS — BP 108/72 | HR 96 | Temp 98.3°F

## 2022-11-17 DIAGNOSIS — Z5111 Encounter for antineoplastic chemotherapy: Secondary | ICD-10-CM | POA: Insufficient documentation

## 2022-11-17 DIAGNOSIS — Z23 Encounter for immunization: Secondary | ICD-10-CM | POA: Diagnosis not present

## 2022-11-17 DIAGNOSIS — C3411 Malignant neoplasm of upper lobe, right bronchus or lung: Secondary | ICD-10-CM | POA: Insufficient documentation

## 2022-11-17 DIAGNOSIS — C3491 Malignant neoplasm of unspecified part of right bronchus or lung: Secondary | ICD-10-CM

## 2022-11-17 DIAGNOSIS — Z5112 Encounter for antineoplastic immunotherapy: Secondary | ICD-10-CM

## 2022-11-17 HISTORY — DX: Encounter for antineoplastic immunotherapy: Z51.12

## 2022-11-17 LAB — CBC WITH DIFFERENTIAL (CANCER CENTER ONLY)
Abs Immature Granulocytes: 0.01 10*3/uL (ref 0.00–0.07)
Basophils Absolute: 0 10*3/uL (ref 0.0–0.1)
Basophils Relative: 1 %
Eosinophils Absolute: 0.3 10*3/uL (ref 0.0–0.5)
Eosinophils Relative: 8 %
HCT: 32.3 % — ABNORMAL LOW (ref 36.0–46.0)
Hemoglobin: 10.9 g/dL — ABNORMAL LOW (ref 12.0–15.0)
Immature Granulocytes: 0 %
Lymphocytes Relative: 17 %
Lymphs Abs: 0.6 10*3/uL — ABNORMAL LOW (ref 0.7–4.0)
MCH: 35 pg — ABNORMAL HIGH (ref 26.0–34.0)
MCHC: 33.7 g/dL (ref 30.0–36.0)
MCV: 103.9 fL — ABNORMAL HIGH (ref 80.0–100.0)
Monocytes Absolute: 0.4 10*3/uL (ref 0.1–1.0)
Monocytes Relative: 11 %
Neutro Abs: 2.4 10*3/uL (ref 1.7–7.7)
Neutrophils Relative %: 63 %
Platelet Count: 214 10*3/uL (ref 150–400)
RBC: 3.11 MIL/uL — ABNORMAL LOW (ref 3.87–5.11)
RDW: 15.7 % — ABNORMAL HIGH (ref 11.5–15.5)
WBC Count: 3.8 10*3/uL — ABNORMAL LOW (ref 4.0–10.5)
nRBC: 0 % (ref 0.0–0.2)

## 2022-11-17 LAB — CMP (CANCER CENTER ONLY)
ALT: 52 U/L — ABNORMAL HIGH (ref 0–44)
AST: 40 U/L (ref 15–41)
Albumin: 3.5 g/dL (ref 3.5–5.0)
Alkaline Phosphatase: 115 U/L (ref 38–126)
Anion gap: 11 (ref 5–15)
BUN: 11 mg/dL (ref 6–20)
CO2: 25 mmol/L (ref 22–32)
Calcium: 9.2 mg/dL (ref 8.9–10.3)
Chloride: 104 mmol/L (ref 98–111)
Creatinine: 0.9 mg/dL (ref 0.44–1.00)
GFR, Estimated: >60 mL/min (ref >60)
Glucose, Bld: 113 mg/dL — ABNORMAL HIGH (ref 70–99)
Potassium: 3.5 mmol/L (ref 3.5–5.1)
Sodium: 140 mmol/L (ref 135–145)
Total Bilirubin: 0.6 mg/dL (ref 0.3–1.2)
Total Protein: 7.1 g/dL (ref 6.5–8.1)

## 2022-11-17 LAB — SAMPLE TO BLOOD BANK

## 2022-11-17 MED ORDER — SODIUM CHLORIDE 0.9 % IV SOLN: Freq: Once | INTRAVENOUS | Status: AC

## 2022-11-17 MED ORDER — SODIUM CHLORIDE 0.9% FLUSH
10.0000 mL | INTRAVENOUS | Status: DC | PRN
  Administered 2022-11-17: 10 mL

## 2022-11-17 MED ORDER — HEPARIN SOD (PORK) LOCK FLUSH 100 UNIT/ML IV SOLN
500.0000 [IU] | Freq: Once | INTRAVENOUS | Status: AC | PRN
  Administered 2022-11-17: 500 [IU]

## 2022-11-17 MED ORDER — INFLUENZA VIRUS VACC SPLIT PF (FLUZONE) 0.5 ML IM SUSY
0.5000 mL | PREFILLED_SYRINGE | Freq: Once | INTRAMUSCULAR | Status: AC
  Administered 2022-11-17: 0.5 mL via INTRAMUSCULAR
  Filled 2022-11-17: qty 0.5

## 2022-11-17 MED ORDER — SODIUM CHLORIDE 0.9 % IV SOLN
200.0000 mg | Freq: Once | INTRAVENOUS | Status: AC
  Administered 2022-11-17: 200 mg via INTRAVENOUS
  Filled 2022-11-17: qty 200

## 2022-11-17 MED ORDER — SODIUM CHLORIDE 0.9 % IV SOLN
400.0000 mg/m2 | Freq: Once | INTRAVENOUS | Status: AC
  Administered 2022-11-17: 600 mg via INTRAVENOUS
  Filled 2022-11-17: qty 20

## 2022-11-17 MED ORDER — PROCHLORPERAZINE MALEATE 10 MG PO TABS
10.0000 mg | ORAL_TABLET | Freq: Once | ORAL | Status: AC
  Administered 2022-11-17: 10 mg via ORAL
  Filled 2022-11-17: qty 1

## 2022-11-17 NOTE — Patient Instructions (Signed)
[facility] [place]  Discharge Instructions: Thank you for choosing [facility] to provide your oncology and hematology care.   If you have a lab appointment with the Cancer Center, please go directly to the Cancer Center and check in at the registration area.   Wear comfortable clothing and clothing appropriate for easy access to any Portacath or PICC line.   We strive to give you quality time with your provider. You may need to reschedule your appointment if you arrive late (15 or more minutes).  Arriving late affects you and other patients whose appointments are after yours.  Also, if you miss three or more appointments without notifying the office, you may be dismissed from the clinic at the provider's discretion.      For prescription refill requests, have your pharmacy contact our office and allow 72 hours for refills to be completed.    Today you received the following chemotherapy and/or immunotherapy agents :  Pembrolizumab & Pemetrexed.       To help prevent nausea and vomiting after your treatment, we encourage you to take your nausea medication as directed.  BELOW ARE SYMPTOMS THAT SHOULD BE REPORTED IMMEDIATELY: *FEVER GREATER THAN 100.4 F (38 C) OR HIGHER *CHILLS OR SWEATING *NAUSEA AND VOMITING THAT IS NOT CONTROLLED WITH YOUR NAUSEA MEDICATION *UNUSUAL SHORTNESS OF BREATH *UNUSUAL BRUISING OR BLEEDING *URINARY PROBLEMS (pain or burning when urinating, or frequent urination) *BOWEL PROBLEMS (unusual diarrhea, constipation, pain near the anus) TENDERNESS IN MOUTH AND THROAT WITH OR WITHOUT PRESENCE OF ULCERS (sore throat, sores in mouth, or a toothache) UNUSUAL RASH, SWELLING OR PAIN  UNUSUAL VAGINAL DISCHARGE OR ITCHING   Items with * indicate a potential emergency and should be followed up as soon as possible or go to the Emergency Department if any problems should occur.  Please show the CHEMOTHERAPY ALERT CARD or  IMMUNOTHERAPY ALERT CARD at check-in to the Emergency Department and triage nurse.  Should you have questions after your visit or need to cancel or reschedule your appointment, please contact [facility]  Dept: [phone]  and follow the prompts.  Office hours are 8:00 a.m. to 4:30 p.m. Monday - Friday. Please note that voicemails left after 4:00 p.m. may not be returned until the following business day.  We are closed weekends and major holidays. You have access to a nurse at all times for urgent questions. Please call the main number to the clinic Dept: [phone] and follow the prompts.   For any non-urgent questions, you may also contact your provider using MyChart. We now offer e-Visits for anyone 53 and older to request care online for non-urgent symptoms. For details visit [email or web address].   Also download the MyChart app! Go to the app store, search "MyChart", open the app, select [place], and log in with your MyChart username and password.

## 2022-11-18 ENCOUNTER — Encounter: Payer: Self-pay | Admitting: Internal Medicine

## 2022-11-25 ENCOUNTER — Ambulatory Visit (HOSPITAL_COMMUNITY)
Admission: RE | Admit: 2022-11-25 | Discharge: 2022-11-25 | Disposition: A | Discharge status: Home / Self Care | Payer: Commercial Managed Care - PPO | Source: Ambulatory Visit | Attending: Physician Assistant | Admitting: Physician Assistant

## 2022-11-25 DIAGNOSIS — C3491 Malignant neoplasm of unspecified part of right bronchus or lung: Secondary | ICD-10-CM | POA: Diagnosis present

## 2022-11-25 MED ORDER — IOHEXOL 300 MG/ML  SOLN
100.0000 mL | Freq: Once | INTRAMUSCULAR | Status: AC | PRN
  Administered 2022-11-25: 100 mL via INTRAVENOUS

## 2022-12-02 NOTE — Progress Notes (Signed)
[facility] OFFICE PROGRESS NOTE  Tyliah, Schlereth, NP [address] [place] [address]  DIAGNOSIS: Recurrent/metastatic non-small cell lung cancer initially diagnosed as stage IIIB  (T1b, N3, M0) non-small cell lung cancer, adenocarcinoma she presented with right upper lobe nodule in addition to bulky right hilar, mediastinal, and left supraclavicular lymphadenopathy diagnosed in November 2022.  The patient had evidence for disease recurrence in the mediastinal and right supraclavicular lymphadenopathy in August 2023.  The patient had evidence of metastatic disease in February 2024 with several supraclavicular, thoracic and precarinal lymphadenopathy as well as upper abdominal lymph nodes and small liver lesion.   DETECTED ALTERATION(S) / BIOMARKER(S)% CFDNA OR AMPLIFICATIONASSOCIATED FDA-APPROVED THERAPIESCLINICAL TRIAL AVAILABILITY [id number] ND 0.5 5 50 100 4.7%   RHOAG17E ND 0.5 5 50 100 1.8%   [id number] ND 0.5 5 50 100 1.9%   BIOMARKERADDITIONAL DETAILS Tumor Mutational Burden (TMB) 19.02 mut/Mb MSI Status Stable (MSS) PD-L1 Tumor Proportion Score (TPS)* <1%   Biomarker Findings By [facility] on 05/06/2022 Tumor Mutational Burden - 17 Muts/Mb Microsatellite status - MS-Stable Genomic Findings For a complete list of the genes assayed, please refer to the Appendix. CTNNB1 S37C CRKL amplification MAP2K4 loss exons 2-11 TP53 V143A 8 Disease relevant genes with no reportable alterations: ALK, BRAF, EGFR, ERBB2, KRAS, MET, RET, ROS1   PDL1 0%  PRIOR THERAPY: 1) Concurrent chemoradiation with carboplatin for an AUC of 2 and paclitaxel 45 mg per metered squared.  First dose on 01/05/2021.  Status post 7 cycles of treatment.  Last dose was given February 16, 2021. 2) Consolidation immunotherapy with Imfinzi 1500 Mg IV every 4 weeks.  First dose March 25, 2021.  Status post 3 cycles.  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 3) Palliative radiotherapy to the enlarging right supraclavicular lymphadenopathy under the care of Dr. Roselind Messier expected to be completed on October 28, 2021.  CURRENT THERAPY: Palliative systemic chemotherapy with carboplatin for AUC of 5, Alimta 500 Mg/M2 and Keytruda 200 Mg IV every 3 weeks.  First dose April 05, 2022.  Status post 11 cycles.  Starting from cycle #3 her dose of carboplatin was reduced to AUC of 4 and Alimta 400 Mg/M2.  Starting from cycle #5 the patient will be on maintenance treatment with Alimta and Keytruda every 3 weeks.   INTERVAL HISTORY: KYARRA VANCAMP 57 y.o. female returns  to the clinic today for a follow up visit. The patient was last seen 3 weeks ago by Dr. Arbutus Ped. She is currently undergoing chemotherapy and immunotherapy for her lung cancer. She tolerates it well.    Her initial temperature was 100.3 today. This was rechecked and was 99.8. She denies personal signs of infection sure as sore throat, changes with breathing besides occasional chest tightness (she uses her inhaler if needed), eat pain, etc. She reports her husband has URI and her grandchild is being treated for an ear infection. She does not feel feverish and denies any documented fevers at home. She reports a baseline cough mainly in the AM but once she gets up and moves around, her cough improves.    She gets night sweats on and off at baseline. Denies any hemoptysis.  She denies any nausea in the interval.  Denies any vomiting, diarrhea, or constipation. She denies unusual headaches. The enlarged lymph node on her neck waxes and wanes with swelling. It is a little more swollen at her last appointment but she states it has waned since last being  seen. Per chart review, it looks like her right supraclavicular lymph node was radiated in September 2023. She though it was the left but is unsure.  Denies any rashes or skin changes. She recently had a restaging CT scan. She is here today  for evaluation and to review her scan results before undergoing cycle #12    MEDICAL HISTORY: Past Medical History:  Diagnosis Date   Abdominal discomfort    Cancer (HCC)    Chronic headaches    due to allergies, sinus   COPD (chronic obstructive pulmonary disease) (HCC)    per 2012 chest xray   pt states she doesn not have this now (04/10/2013)   Deviated nasal septum    Eustachian tube dysfunction    GERD (gastroesophageal reflux disease)    occasional uses Tums / Rolaids   Hearing loss    right ear   High cholesterol    History of radiation therapy    right lung 01/07/2021-02/19/2021  Dr Antony Blackbird   Migraine    "only once in a blue moon since RX'd allergy shots" (04/10/2013)   Pancreatitis 02/08/2013   Pneumonia    Rhinitis, allergic     ALLERGIES:  is allergic to carboplatin.  MEDICATIONS:  Current Outpatient Medications  Medication Sig Dispense Refill   acetaminophen (TYLENOL) 500 MG tablet Take 1,000 mg by mouth every 6 (six) hours as needed for moderate pain.     albuterol (PROVENTIL) (2.5 MG/3ML) 0.083% nebulizer solution albuterol sulfate 2.5 mg/3 mL (0.083 %) solution for nebulization  USE 1 VIAL IN NEBULIZER EVERY 6 HOURS AS NEEDED FOR WHEEZING OR SHORTNESS OF BREATH     albuterol (VENTOLIN HFA) 108 (90 Base) MCG/ACT inhaler Inhale 1-2 puffs into the lungs every 6 (six) hours as needed for wheezing or shortness of breath. 18 g 3   aspirin EC 81 MG tablet Take 81 mg by mouth at bedtime.     atorvastatin (LIPITOR) 40 MG tablet Take 40 mg by mouth at bedtime.     azelastine (ASTELIN) 0.1 % nasal spray Place 1 spray into both nostrils 2 (two) times daily as needed. Use in each nostril as directed 30 mL 5   benzonatate (TESSALON) 200 MG capsule Take 1 capsule (200 mg total) by mouth 3 (three) times daily as needed. 30 capsule 0   Biotin 5000 MCG TABS Take 5,000 mcg by mouth at bedtime.     buPROPion (WELLBUTRIN XL) 150 MG 24 hr tablet Take 150 mg by mouth every  morning.     Ca Carbonate-Mag Hydroxide (ROLAIDS PO) Take 1 tablet by mouth daily as needed (heartburn).     cetirizine (ZYRTEC ALLERGY) 10 MG tablet Take 1 tablet (10 mg total) by mouth daily as needed for rhinitis. 30 tablet 5   doxycycline (VIBRAMYCIN) 100 MG capsule Take 100 mg by mouth 2 (two) times daily.     famotidine (PEPCID) 20 MG tablet Take 1 tablet by mouth twice daily 60 tablet 0   fluticasone (FLONASE) 50 MCG/ACT nasal spray Place 2 sprays into both nostrils daily. 16 g 5   folic acid (FOLVITE) 1 MG tablet Take 1 tablet by mouth once daily 30 tablet 0   gabapentin (NEURONTIN) 300 MG capsule Take 300 mg by mouth.     hydrocortisone 1 % ointment Apply 1 Application topically 2 (two) times daily. 30 g 0   lidocaine-prilocaine (EMLA) cream APPLY TO PORT-A-CATH 30 TO 60 MINUTES BEFORE TREATMENT 30 g 0   linaCLOtide (LINZESS PO) Take  by mouth.     Multiple Vitamins-Minerals (HAIR SKIN AND NAILS FORMULA PO) Take 1 tablet by mouth daily.     ondansetron (ZOFRAN-ODT) 8 MG disintegrating tablet Take 1 tablet (8 mg total) by mouth 3 (three) times daily as needed. 20 tablet 1   Oxycodone HCl 10 MG TABS Take 1 tablet by mouth 3 (three) times daily as needed (pain).     PARoxetine (PAXIL) 10 MG tablet Take 10 mg by mouth daily.     prochlorperazine (COMPAZINE) 10 MG tablet Take 1 tablet (10 mg total) by mouth every 6 (six) hours as needed. 30 tablet 2   rizatriptan (MAXALT-MLT) 10 MG disintegrating tablet Take 10 mg by mouth as needed for migraine. May repeat in 2 hours if needed     sertraline (ZOLOFT) 50 MG tablet Take 1 tablet by mouth daily.     Tiotropium Bromide-Olodaterol (STIOLTO RESPIMAT) 2.5-2.5 MCG/ACT AERS Inhale 2 puffs into the lungs daily. 12 g 3   No current facility-administered medications for this visit.   Facility-Administered Medications Ordered in Other Visits  Medication Dose Route Frequency Provider Last Rate Last Admin   cyanocobalamin (VITAMIN B12) injection 1,000  mcg  1,000 mcg Intramuscular Once Si Gaul, MD       heparin lock flush 100 unit/mL  500 Units Intracatheter Once PRN Si Gaul, MD       pembrolizumab [facility]) 200 mg in sodium chloride 0.9 % 50 mL chemo infusion  200 mg Intravenous Once Si Gaul, MD 116 mL/hr at 12/08/22 1012 200 mg at 12/08/22 1012   PEMEtrexed (ALIMTA) 600 mg in sodium chloride 0.9 % 100 mL chemo infusion  400 mg/m2 (Treatment Plan Recorded) Intravenous Once Si Gaul, MD       sodium chloride flush (NS) 0.9 % injection 10 mL  10 mL Intracatheter PRN Si Gaul, MD        SURGICAL HISTORY:  Past Surgical History:  Procedure Laterality Date   ABDOMINAL HYSTERECTOMY  1995   tx endometriosis, both ovaries removed   ADENOIDECTOMY     APPENDECTOMY  late 1990's   BIOPSY  02/26/2021   Procedure: BIOPSY;  Surgeon: Rachael Fee, MD;  Location: [facility] ENDOSCOPY;  Service: Endoscopy;;   BRONCHIAL BRUSHINGS  12/15/2020   Procedure: BRONCHIAL BRUSHINGS;  Surgeon: Leslye Peer, MD;  Location: [facility] ENDOSCOPY;  Service: Cardiopulmonary;;   BRONCHIAL NEEDLE ASPIRATION BIOPSY  12/15/2020   Procedure: BRONCHIAL NEEDLE ASPIRATION BIOPSIES;  Surgeon: Leslye Peer, MD;  Location: [facility] ENDOSCOPY;  Service: Cardiopulmonary;;   CHOLECYSTECTOMY  04/10/2013   CHOLECYSTECTOMY N/A 04/10/2013   Procedure: LAPAROSCOPIC CHOLECYSTECTOMY WITH INTRAOPERATIVE CHOLANGIOGRAM;  Surgeon: Adolph Pollack, MD;  Location: [facility] OR;  Service: General;  Laterality: N/A;   ELECTROMAGNETIC NAVIGATION BROCHOSCOPY  12/15/2020   Procedure: ELECTROMAGNETIC NAVIGATION BRONCHOSCOPY;  Surgeon: Leslye Peer, MD;  Location: [facility] ENDOSCOPY;  Service: Cardiopulmonary;;   ESOPHAGOGASTRODUODENOSCOPY (EGD) WITH PROPOFOL N/A 02/26/2021   Procedure: ESOPHAGOGASTRODUODENOSCOPY (EGD) WITH PROPOFOL;  Surgeon: Rachael Fee, MD;  Location: [facility] ENDOSCOPY;  Service: Endoscopy;  Laterality: N/A;   EUS N/A 02/16/2013   Procedure: UPPER ENDOSCOPIC  ULTRASOUND (EUS) LINEAR;  Surgeon: Rachael Fee, MD;  Location: [facility] ENDOSCOPY;  Service: Endoscopy;  Laterality: N/A;  radial linear   IR IMAGING GUIDED PORT INSERTION  06/30/2022   KNEE ARTHROSCOPY Right 1980's   "cartilage OR"   LAPAROSCOPIC ENDOMETRIOSIS FULGURATION  1980's   MYRINGOTOMY WITH TUBE PLACEMENT Right 07/13/2018   Procedure: MYRINGOTOMY WITH TUBE PLACEMENT;  Surgeon: Elenore Rota,  Renae Fickle, MD;  Location: [facility];  Service: ENT;  Laterality: Right;   MYRINGOTOMY WITH TUBE PLACEMENT Right 06/04/2021   Procedure: MYRINGOTOMY WITH BUTTERFLY TUBE PLACEMENT;  Surgeon: Vernie Murders, MD;  Location: [facility];  Service: ENT;  Laterality: Right;   NASOPHARYNGOSCOPY EUSTATION TUBE BALLOON DILATION Right 07/13/2018   Procedure: NASOPHARYNGOSCOPY EUSTATION TUBE BALLOON DILATION;  Surgeon: Vernie Murders, MD;  Location: [facility];  Service: ENT;  Laterality: Right;   TONSILLECTOMY AND ADENOIDECTOMY  ~ 1980   adenoidectomy   TUBAL LIGATION  ~ 1987   TURBINATE REDUCTION Right 07/13/2018   Procedure: OUTFRACTURE TURBINATE;  Surgeon: Vernie Murders, MD;  Location: [facility];  Service: ENT;  Laterality: Right;   TYMPANOSTOMY TUBE PLACEMENT     VIDEO BRONCHOSCOPY WITH ENDOBRONCHIAL ULTRASOUND N/A 12/15/2020   Procedure: ROBOTIC VIDEO BRONCHOSCOPY WITH ENDOBRONCHIAL ULTRASOUND;  Surgeon: Leslye Peer, MD;  Location: [facility] ENDOSCOPY;  Service: Cardiopulmonary;  Laterality: N/A;   WRIST SURGERY Left    w/plate    REVIEW OF SYSTEMS:   Review of Systems  Constitutional: Positive for fatigue. Negative for appetite change, chills, and unexpected weight change.  HENT: Negative for mouth sores, nosebleeds, sore throat and trouble swallowing.   Eyes: Negative for eye problems and icterus.  Respiratory: Positive for baseline dyspnea on exertion and stable occasional dry cough in the AM. Negative for hemoptysis and wheezing.   Cardiovascular: Negative for chest pain and  leg swelling.  Gastrointestinal: Negative for abdominal pain, nausea, constipation, diarrhea, and vomiting.  Genitourinary: Negative for bladder incontinence, difficulty urinating, dysuria, frequency and hematuria.   Musculoskeletal: Negative for back pain, gait problem, neck pain and neck stiffness.  Skin: Negative for itching and rash.  Neurological: Negative for dizziness, extremity weakness, gait problem, headaches, light-headedness and seizures.  Hematological: Positive for left supraclavicular adenopathy. Does not bruise/bleed easily.  Psychiatric/Behavioral: Negative for confusion, depression and sleep disturbance. The patient is not nervous/anxious.     PHYSICAL EXAMINATION:  Blood pressure 114/68, pulse 100, temperature 99.8 F (37.7 C), temperature source Oral, resp. rate 18, height 5\' 2"  (1.575 m), weight 105 lb 4.8 oz (47.8 kg), SpO2 100%.  ECOG PERFORMANCE STATUS: 1  Physical Exam  Constitutional: Oriented to person, place, and time and well-developed, well-nourished, and in no distress.  HENT:  Head: Normocephalic and atraumatic.  Mouth/Throat: Oropharynx is clear and moist. No oropharyngeal exudate.  Eyes: Conjunctivae are normal. Right eye exhibits no discharge. Left eye exhibits no discharge. No scleral icterus.  Neck: Normal range of motion. Neck supple.  Cardiovascular: Normal rate, regular rhythm, normal heart sounds and intact distal pulses.   Pulmonary/Chest: Effort normal and breath sounds normal. No respiratory distress. No wheezes. No rales.  Abdominal: Soft. Bowel sounds are normal. Exhibits no distension and no mass. There is no tenderness.  Musculoskeletal: Normal range of motion. Exhibits no edema.  Lymphadenopathy:    Left supraclavicular adenopathy.  Neurological: Alert and oriented to person, place, and time. Exhibits normal muscle tone. Gait normal. Coordination normal.  Skin: Skin is warm and dry. No rash noted. Not diaphoretic. No erythema. No pallor.   Psychiatric: Mood, memory and judgment normal.  Vitals reviewed.  LABORATORY DATA: Lab Results  Component Value Date   WBC 3.2 (L) 12/08/2022   HGB 11.3 (L) 12/08/2022   HCT 33.6 (L) 12/08/2022   MCV 105.3 (H) 12/08/2022   PLT 192 12/08/2022      Chemistry      Component Value Date/Time   NA 141  12/08/2022 0844   K 3.6 12/08/2022 0844   CL 106 12/08/2022 0844   CO2 27 12/08/2022 0844   BUN 9 12/08/2022 0844   CREATININE 0.81 12/08/2022 0844      Component Value Date/Time   CALCIUM 9.6 12/08/2022 0844   ALKPHOS 117 12/08/2022 0844   AST 43 (H) 12/08/2022 0844   ALT 47 (H) 12/08/2022 0844   BILITOT 0.4 12/08/2022 0844       RADIOGRAPHIC STUDIES:  CT CHEST ABDOMEN PELVIS W CONTRAST  Result Date: 12/08/2022 CLINICAL DATA:  Stage IV adenocarcinoma of the right lung. Restaging. * Tracking Code: BO * EXAM: CT CHEST, ABDOMEN, AND PELVIS WITH CONTRAST TECHNIQUE: Multidetector CT imaging of the chest, abdomen and pelvis was performed following the standard protocol during bolus administration of intravenous contrast. RADIATION DOSE REDUCTION: This exam was performed according to the departmental dose-optimization program which includes automated exposure control, adjustment of the mA and/or kV according to patient size and/or use of iterative reconstruction technique. CONTRAST:  OMNIPAQUE IOHEXOL 300 MG/ML  SOLN COMPARISON:  09/13/2022 FINDINGS: CT CHEST FINDINGS Cardiovascular: The heart size is normal. No substantial pericardial effusion. No thoracic aortic aneurysm. Right Port-A-Cath tip is positioned in the distal SVC. Mediastinum/Nodes: Confluent soft tissue attenuation in the right hilar region is stable compatible with treatment related change. There is no mediastinal or hilar lymphadenopathy. Small distal paraesophageal lymph node is stable. Mild circumferential wall thickening noted mid esophagus, similar to prior and potentially secondary to therapy. There is no axillary  lymphadenopathy. Lungs/Pleura: Centrilobular and paraseptal emphysema evident. Treatment related parahilar scarring in the right lung is stable. No new suspicious pulmonary nodule or mass. No pleural effusion. Musculoskeletal: No worrisome lytic or sclerotic osseous abnormality. CT ABDOMEN PELVIS FINDINGS Hepatobiliary: No suspicious focal abnormality within the liver parenchyma. Cholecystectomy. Stable intra and extrahepatic biliary duct dilatation. Common bile duct diameter 8 mm in the head of the pancreas. Pancreas: Stable distention of the main pancreatic duct through the head and body of the pancreas. No pancreatic mass lesion evident. Spleen: No splenomegaly. No suspicious focal mass lesion. Adrenals/Urinary Tract: No adrenal nodule or mass. Stable small cyst interpolar right kidney. Tiny hypoattenuating lesions upper pole left kidney are too small to characterize but statistically most likely benign. No followup imaging is recommended. No evidence for hydroureter. The urinary bladder appears normal for the degree of distention. Stomach/Bowel: Stomach is unremarkable. Mild wall thickening in the antral region of the stomach is stable and nonspecific. Duodenum is normally positioned as is the ligament of Treitz. No small bowel wall thickening. No small bowel dilatation. The terminal ileum is normal. Nonvisualization of the appendix is consistent with the reported history of appendectomy. No gross colonic mass. No colonic wall thickening. Diverticular changes are noted in the left colon without evidence of diverticulitis. Vascular/Lymphatic: There is mild atherosclerotic calcification of the abdominal aorta without aneurysm. There is no gastrohepatic or hepatoduodenal ligament lymphadenopathy. No retroperitoneal or mesenteric lymphadenopathy. No pelvic sidewall lymphadenopathy. Reproductive: There is no adnexal mass. Other: No intraperitoneal free fluid. Musculoskeletal: No worrisome lytic or sclerotic osseous  abnormality. IMPRESSION: 1. Stable exam. No new or progressive findings to suggest recurrent or metastatic disease in the chest, abdomen, or pelvis. 2. Stable confluent soft tissue attenuation in the right hilar region compatible with treatment related change. 3. Stable intra and extrahepatic biliary duct with distention of the main pancreatic duct through the head and body of the pancreas. No pancreatic mass lesion evident. 4. Left colonic diverticulosis without diverticulitis.  5. Aortic Atherosclerosis (ICD10-I70.0) and Emphysema (ICD10-J43.9). Electronically Signed   By: Kennith Center M.D.   On: 12/08/2022 08:11   CT Soft Tissue Neck W Contrast  Result Date: 12/08/2022 CLINICAL DATA:  57 year old female with metastatic lung cancer. History of supraclavicular lymph node metastases. Left level 5 nodal station progression in August. EXAM: CT NECK WITH CONTRAST TECHNIQUE: Multidetector CT imaging of the neck was performed using the standard protocol following the bolus administration of intravenous contrast. RADIATION DOSE REDUCTION: This exam was performed according to the departmental dose-optimization program which includes automated exposure control, adjustment of the mA and/or kV according to patient size and/or use of iterative reconstruction technique. CONTRAST:  OMNIPAQUE IOHEXOL 300 MG/ML  SOLN COMPARISON:  Neck CTs 09/13/2022 and earlier. CT Chest, Abdomen, and Pelvis reported separately. FINDINGS: Pharynx and larynx: Larynx and pharynx soft tissue contours remain within normal limits. Parapharyngeal and retropharyngeal spaces appear negative. Salivary glands: Negative.  Negative sublingual space. Thyroid: Negative. Lymph nodes: Regressed, now small but heterogeneous bilateral level 4 lymph nodes since August. See series 3 images 66 through 71. Previously seen left side 10 mm node there is now about 5 mm. However, enlarged and partially cystic/necrotic left level IIIb or level 5 lymph node lateral  to the external jugular vein on series 3, image 67. Nodal conglomeration there is now 2 cm long axis, versus adjacent subcentimeter lymph nodes there in August. Necrotic node on series 3, image 62. Nearby left level IIIb nodes are smaller on series 3, image 55 (previously up to 12 mm now 7 mm), but heterogeneous. Level 1 and level 2 stations remain spared. Superior level 5 nodes remain normal. Vascular: Chronic right IJ Port-A-Cath. Major vascular structures in the neck and at the skull base remain patent. Limited intracranial: Negative. Visualized orbits: Negative. Mastoids and visualized paranasal sinuses: Stable mild maxillary alveolar recess mucosal thickening. Skeleton: Chronic cervical spine degeneration. Osteopenia. No acute or suspicious osseous lesion in the neck. Upper chest: Dedicated chest CT the same day is reported separately. IMPRESSION: 1. Lower neck metastatic lymph nodes demonstrate mixed response to therapy since August: - progression of a single cluster of partially necrotic lymph nodes just above the left clavicle (series 3 image 63). That nodal conglomeration now 2 cm. - regression of other abnormal left level 3B and bilateral level 4/5 nodes since 09/13/2022, all subcentimeter now. 2. No other metastatic disease identified in the Neck. 3. CT Chest, Abdomen, and Pelvis reported separately. Electronically Signed   By: Odessa Fleming M.D.   On: 12/08/2022 07:59     ASSESSMENT/PLAN:  This is a very pleasant 57 year old Caucasian female with recurrent lung cancer initially diagnosed as stage IIIB (T1b, N3, M0) non-small cell lung cancer, adenocarcinoma.  She presented with a right upper lobe spiculated nodule and right hilar, mediastinal, and left supraclavicular adenopathy.  She was diagnosed in November 2022.   Therefore, the patient had guardant 360 performed which showed no actionable mutations.  Her PD-L1 expression is less than 1%.  She was found to have metastatic disease in February 2024    The patient completed a course of concurrent chemoradiation with carboplatin for an AUC of 2 and paclitaxel 45 mg per metered square.  She status post 7 cycles.  She had some significant odynophagia and dysphagia from treatment.   She was seen by Dr. Christella Hartigan while admitted to the hospital and a EGD was performed which showed single 1cm oval shaped very shallow ulceration.  She recovered.   She  then had consolidation immunotherapy with Imfinzi 1500 mg IV every 4 weeks.  She is status post 3 cycles.  She developed suspicious immunotherapy mediated pneumonitis and this was ultimately discontinued.   The patient was seen by Dr. Roselind Messier and started palliative radiotherapy to the enlarging right supraclavicular lymphadenopathy. This was completed on October 28, 2021.    The patient was found to have metastatic disease in February 2024 with several lymphadenopathy in the chest as well as supraclavicular, upper abdomen as well as suspicious small liver metastasis. 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    She is currently undergoing systemic chemotherapy with carboplatin for AUC of 5, Alimta 500 Mg/M2 and Keytruda 200 Mg IV every 3 weeks status post 11 cycles.  Starting from cycle #3, Dr. Arbutus Ped reduced her dose of carboplatin to AUC of 4 and Alimta 400 Mg/M2. Starting from cycle #5 the patient started on maintenance treatment with Alimta and Keytruda every 3 weeks.   The patient was seen with Dr. Arbutus Ped today.  Dr. Arbutus Ped personally and independently reviewed the scan and discussed results with the patient today.  The scan showed no evidence of disease progression except for progression of a single cluster of partially necrotic lymph nodes above the left clavicle.  Dr. Arbutus Ped recommends that she continue on the same treatment  at the same dose but we will refer her to radiation oncology to consider radiation to the left supraclavicular lymph nodes.     Labs were reviewed.  Recommend that she proceed with cycle #12 today scheduled.   We will see her back for follow-up visit in 3 weeks for evaluation repeat blood work before undergoing cycle number 12.  She has some sick contacts at home.  Her temperature was rechecked at 99.8.  She denies any specific signs and symptoms of infection today.  She knows to call to be evaluated if she develops any symptoms of infection in the interval.  The patient was advised to call immediately if she has any concerning symptoms in the interval. The patient voices understanding of current disease status and treatment options and is in agreement with the current care plan. All questions were answered. The patient knows to call the clinic with any problems, questions or concerns. We can certainly see the patient much sooner if necessary   Orders Placed This Encounter  Procedures   Ambulatory referral to Radiation Oncology    Referral Priority:   Routine    Referral Type:   Consultation    Referral Reason:   Specialty Services Required    Requested Specialty:   Radiation Oncology    Number of Visits Requested:   1     Aurel Nguyen L Nura Cahoon, PA-C 12/08/22  ADDENDUM: Hematology/Oncology Attending: I had a face-to-face encounter with the patient today.  I reviewed her records, lab, scan and recommended her care plan.  This is a very pleasant 57 years old white female with recurrent/metastatic non-small cell lung cancer that was initially diagnosed as stage IIIb in November 2022 with disease recurrence in August 2023 status post palliative radiotherapy.  The patient started systemic chemotherapy with carboplatin, Alimta and Keytruda for 4 cycles followed by maintenance treatment with Alimta and Keytruda every 3 weeks status post total of 11 cycles.  She has been tolerating her  treatment fairly well with no concerning adverse effects. She had repeat  CT scan of the neck chest, abdomen and pelvis performed recently.  I personally and independently reviewed the scan and discussed the result with the patient today. Her scan showed no concerning findings for disease progression in the chest, abdomen and pelvis.  CT scan of the neck showed the lower neck metastatic lymph node with mixed response to therapy with progression of a single cluster of partially necrotic lymph node just above the left clavicle.  I recommended for the patient to continue her current maintenance treatment with Alimta and Keytruda as planned and she will proceed with cycle #12 today. We will refer the patient to Dr. Roselind Messier with radiation oncology for evaluation and discussion of possible palliative radiotherapy to the enlarging left lower neck lymph node. The patient will come back for follow-up visit in 3 weeks for evaluation before the next cycle of her treatment. She was advised to call immediately if she has any other concerning symptoms in the interval. The total time spent in the appointment was 30 minutes. Disclaimer: This note was dictated with voice recognition software. Similar sounding words can inadvertently be transcribed and may be missed upon review. Lajuana Matte, MD

## 2022-12-03 ENCOUNTER — Ambulatory Visit (INDEPENDENT_AMBULATORY_CARE_PROVIDER_SITE_OTHER): Payer: Commercial Managed Care - PPO | Admitting: Nurse Practitioner

## 2022-12-03 ENCOUNTER — Encounter: Payer: Self-pay | Admitting: Nurse Practitioner

## 2022-12-03 VITALS — BP 110/70 | Ht 62.0 in | Wt 106.8 lb

## 2022-12-03 DIAGNOSIS — C3491 Malignant neoplasm of unspecified part of right bronchus or lung: Secondary | ICD-10-CM

## 2022-12-03 DIAGNOSIS — J449 Chronic obstructive pulmonary disease, unspecified: Secondary | ICD-10-CM

## 2022-12-03 NOTE — Patient Instructions (Signed)
Continue Albuterol inhaler 2 puffs or 3 mL neb every 6 hours as needed for shortness of breath or wheezing. Notify if symptoms persist despite rescue inhaler/neb use.  Continue Stiolto 2 puffs daily Continue astelin 1 spray each nostril Twice daily  Continue zyrtec 1 tab daily Continue flonase nasal spray 2 sprays each nostril daily  Attend appointment with oncology as scheduled  Follow up in 6 months with Dr. Delton Coombes as scheduled. If symptoms worsen, please contact office for sooner follow up or seek emergency care.

## 2022-12-03 NOTE — Assessment & Plan Note (Signed)
Compensated on current regimen.  She has had 1 exacerbation requiring steroids this year; recovered well.  Encouraged to remain smoke-free.  Activity as tolerated.  Action plan in place.  Patient Instructions  Continue Albuterol inhaler 2 puffs or 3 mL neb every 6 hours as needed for shortness of breath or wheezing. Notify if symptoms persist despite rescue inhaler/neb use.  Continue Stiolto 2 puffs daily Continue astelin 1 spray each nostril Twice daily  Continue zyrtec 1 tab daily Continue flonase nasal spray 2 sprays each nostril daily  Attend appointment with oncology as scheduled  Follow up in 6 months with Dr. Delton Coombes as scheduled. If symptoms worsen, please contact office for sooner follow up or seek emergency care.

## 2022-12-03 NOTE — Progress Notes (Signed)
@Patient  ID: Julie Nicholson, female    DOB: 1965-10-25, 57 y.o.   MRN: [id number]  Chief Complaint  Patient presents with   Follow-up    Pt is here for COPD/Centrilobular Emphysema F/U visit. Pt states she is Fatigue every morning.    Referring provider: Elie Confer, NP  HPI: 57 year old female, former smoker (41 pack years) followed for DOE and adenocarcinoma of RUL. She is a patient of Dr. Kavin Leech and last seen in office on 05/21/2022. She is currently on Imfinzi and followed by Dr. Arbutus Ped with oncology. Past medical history significant for GERD r/t radiation esophagitis, HLD, chronic cholecystitis.   TEST/EVENTS:  06/25/2021 PFT: FVC 94, FEV1 88, ratio 76, TLC 93, DLCOcor 73.  No BD 09/15/2022 CT chest with contrast: Circumferential wall thickening of esophagus.  Interval development of diffuse micro nodularity throughout both lower lobes, right greater than left.  Most of these measure 1 to 2 mm and are nonspecific, likely infectious or inflammatory.  Stable scarring/fibrosis and architectural distortion in right upper lobe, compatible with former radiation treatments.  Stable mediastinal and hilar lymph nodes.  Previous lesion on right lower lobe not well-visualized.  No new liver lesions.  04/07/2021: OV with Dr. Delton Coombes. Quit smoking in November with Wellbutrin. Followed by oncology for adenocarinoma of right lung and on Imfinzi. Felt more fatigued and some progressive DOE - suspected r/t undiagnosed COPD. Initiated trial with Spiriva. 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   05/08/2021: OV with Maryana Pittmon NP for follow up after being started on a trial of Spiriva for DOE and suspected COPD. She reports that she has been having difficulties with taking the Spiriva because of her cough. Most of the times when she goes to take it, she starts coughing before she can inhale the medicine and feels like she blows it all back out. When she doesn't cough, she does feel like she receives good  benefit from the Spiriva and that it improves her DOE. Other than with inhaler therapy, she reports frequent dry, hacking cough and some chest tightness with deep breathing. She feels like her breathing and chest tightness are unchanged from over the past few months, but does feel like her cough is slightly more frequent. She also notes that her allergies have been worse and she feels like she gets more short of breath when she goes outside. She denies hemoptysis, orthopnea, PND, chest pain or wheezing. She receives good benefit from the tessalon perles. Feels like GERD well-controlled on protonix.   05/21/2022: OV with Dr. Delton Coombes.  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  She was on Imfinzi but stopped due to possible pneumonitis.  Now on palliative systemic chemo with carboplatin, Alimta, Keytruda followed by Dr. Arbutus Ped. COPD and on Stiolto. Not needed albuterol. Allergic rhinitis. Feeling a bit wiped out and fatigued. Not smoking; on wellbutrin.  Also on Zoloft for depression.  12/03/2022: Today - follow up Patient presents today for follow up.  She has been doing well since she was here last here.  She had 1 flareup in September.  Was seen in urgent care.  She was treated with doxycycline and prednisone with improvement.  Symptoms have not recurred since.  Breathing has been doing well.  She really only gets short winded walking uphill or climbing stairs.  Baseline cough in the mornings; productive with clear phlegm. Takes zyrtec which helps with this.  Sinus symptoms are stable. No wheezing, chest congestion, leg  swelling, orthopnea, mopped assist, weight loss, anorexia.  She remains smoke-free.  Using [employer] daily.  Rarely uses her rescue inhaler.  Continues on immune therapy with oncology.  Had recent CT scan for surveillance-awaiting results.  Follow-up with Dr. Arbutus Ped is 11/6.   Allergies  Allergen Reactions   Carboplatin Shortness Of  Breath, Itching and Cough    See progress note from 06/16/22    Immunization History  Administered Date(s) Administered   Influenza Split 09/01/2012   Influenza, High Dose Seasonal PF 10/23/2021   Influenza, Seasonal, Injecte, Preservative Fre 11/17/2022   Influenza,inj,quad, With Preservative 11/22/2019   Influenza-Unspecified 12/02/2020, 10/28/2021   Moderna Sars-Covid-2 Vaccination 02/05/2019, 03/05/2019, 03/06/2020   Pneumococcal Polysaccharide-23 04/11/2013   Td 09/01/2012   Td (Adult), 2 Lf Tetanus Toxid, Preservative Free 09/01/2012   Tdap 08/20/2015    Past Medical History:  Diagnosis Date   Abdominal discomfort    Cancer (HCC)    Chronic headaches    due to allergies, sinus   COPD (chronic obstructive pulmonary disease) (HCC)    per 2012 chest xray   pt states she doesn not have this now (04/10/2013)   Deviated nasal septum    Eustachian tube dysfunction    GERD (gastroesophageal reflux disease)    occasional uses Tums / Rolaids   Hearing loss    right ear   High cholesterol    History of radiation therapy    right lung 01/07/2021-02/19/2021  Dr Antony Blackbird   Migraine    "only once in a blue moon since RX'd allergy shots" (04/10/2013)   Pancreatitis 02/08/2013   Pneumonia    Rhinitis, allergic     Tobacco History: Social History   Tobacco Use  Smoking Status Former   Current packs/day: 0.00   Average packs/day: 0.5 packs/day for 41.0 years (20.5 ttl pk-yrs)   Types: Cigarettes   Start date: 12/24/1979   Quit date: 12/23/2020   Years since quitting: 1.9   Passive exposure: Current (Husband)  Smokeless Tobacco Never   Counseling given: Not Answered   Outpatient Medications Prior to Visit  Medication Sig Dispense Refill   acetaminophen (TYLENOL) 500 MG tablet Take 1,000 mg by mouth every 6 (six) hours as needed for moderate pain.     albuterol (PROVENTIL) (2.5 MG/3ML) 0.083% nebulizer solution albuterol sulfate 2.5 mg/3 mL (0.083 %) solution for  nebulization  USE 1 VIAL IN NEBULIZER EVERY 6 HOURS AS NEEDED FOR WHEEZING OR SHORTNESS OF BREATH     albuterol (VENTOLIN HFA) 108 (90 Base) MCG/ACT inhaler Inhale 1-2 puffs into the lungs every 6 (six) hours as needed for wheezing or shortness of breath. 18 g 3   aspirin EC 81 MG tablet Take 81 mg by mouth at bedtime.     atorvastatin (LIPITOR) 40 MG tablet Take 40 mg by mouth at bedtime.     azelastine (ASTELIN) 0.1 % nasal spray Place 1 spray into both nostrils 2 (two) times daily as needed. Use in each nostril as directed 30 mL 5   benzonatate (TESSALON) 200 MG capsule Take 1 capsule (200 mg total) by mouth 3 (three) times daily as needed. 30 capsule 0   Biotin 5000 MCG TABS Take 5,000 mcg by mouth at bedtime.     buPROPion (WELLBUTRIN XL) 150 MG 24 hr tablet Take 150 mg by mouth every morning.     Ca Carbonate-Mag Hydroxide (ROLAIDS PO) Take 1 tablet by mouth daily as needed (heartburn).     cetirizine (ZYRTEC ALLERGY) 10 MG tablet  Take 1 tablet (10 mg total) by mouth daily as needed for rhinitis. 30 tablet 5   doxycycline (VIBRAMYCIN) 100 MG capsule Take 100 mg by mouth 2 (two) times daily.     famotidine (PEPCID) 20 MG tablet Take 1 tablet by mouth twice daily 60 tablet 0   fluticasone (FLONASE) 50 MCG/ACT nasal spray Place 2 sprays into both nostrils daily. 16 g 5   folic acid (FOLVITE) 1 MG tablet Take 1 tablet by mouth once daily 30 tablet 0   gabapentin (NEURONTIN) 300 MG capsule Take 300 mg by mouth.     hydrocortisone 1 % ointment Apply 1 Application topically 2 (two) times daily. 30 g 0   lidocaine-prilocaine (EMLA) cream APPLY TO PORT-A-CATH 30 TO 60 MINUTES BEFORE TREATMENT 30 g 0   linaCLOtide (LINZESS PO) Take by mouth.     Multiple Vitamins-Minerals (HAIR SKIN AND NAILS FORMULA PO) Take 1 tablet by mouth daily.     ondansetron (ZOFRAN-ODT) 8 MG disintegrating tablet Take 1 tablet (8 mg total) by mouth 3 (three) times daily as needed. 20 tablet 1   Oxycodone HCl 10 MG TABS Take  1 tablet by mouth 3 (three) times daily as needed (pain).     PARoxetine (PAXIL) 10 MG tablet Take 10 mg by mouth daily.     prochlorperazine (COMPAZINE) 10 MG tablet Take 1 tablet (10 mg total) by mouth every 6 (six) hours as needed. 30 tablet 2   rizatriptan (MAXALT-MLT) 10 MG disintegrating tablet Take 10 mg by mouth as needed for migraine. May repeat in 2 hours if needed     sertraline (ZOLOFT) 50 MG tablet Take 1 tablet by mouth daily.     Tiotropium Bromide-Olodaterol (STIOLTO RESPIMAT) 2.5-2.5 MCG/ACT AERS Inhale 2 puffs into the lungs daily. 12 g 3   No facility-administered medications prior to visit.     Review of Systems:   Constitutional: No weight loss or gain, night sweats, fevers, chills. +fatigue (baseline) HEENT: No headaches, difficulty swallowing, tooth/dental problems, or sore throat. No itching, ear ache. +occasional sneezing, occasional nasal congestion - both worse when outside CV:  No chest pain, orthopnea, PND, swelling in lower extremities, anasarca, dizziness, palpitations, syncope Resp: +shortness of breath with exertion; AM cough. No excess mucus or change in color of mucus. No hemoptysis. No wheezing.  No chest wall deformity GI:  No heartburn, indigestion, abdominal pain, nausea, vomiting, diarrhea, change in bowel habits, loss of appetite, bloody stools.  GU: No dysuria, change in color of urine, urgency or frequency.   Skin: No rash, lesions, ulcerations MSK:  No joint pain or swelling.   Neuro: No dizziness or lightheadedness.  Psych: +stable depression. No anxiety. No SI/HI. Mood stable.     Physical Exam:  BP 110/70 (BP Location: Right Arm, Cuff Size: Normal)   Ht 5\' 2"  (1.575 m)   Wt 106 lb 12.8 oz (48.4 kg)   SpO2 98%   BMI 19.53 kg/m   GEN: Pleasant, interactive, well-kempt; in no acute distress. HEENT:  Normocephalic and atraumatic. PERRLA. Sclera white. Nasal turbinates pink, moist and patent bilaterally. No rhinorrhea present. Oropharynx  pink and moist, without exudate or edema. No lesions, ulcerations.  NECK:  Supple w/ fair ROM. No JVD present. Normal carotid impulses w/o bruits. Thyroid symmetrical with no goiter or nodules palpated. No lymphadenopathy.   CV: RRR, no m/r/g, no peripheral edema. Pulses intact, +2 bilaterally. No cyanosis, pallor or clubbing. PULMONARY:  Unlabored, regular breathing. Clear bilaterally A&P w/o wheezes/rales/rhonchi. No  accessory muscle use. No dullness to percussion. GI: BS present and normoactive. Soft, non-tender to palpation. No organomegaly or masses detected. MSK: No erythema, warmth or tenderness. Cap refil <2 sec all extrem. No deformities or joint swelling noted.  Neuro: A/Ox3. No focal deficits noted.   Skin: Warm, no lesions or rashe Psych: Normal affect and behavior. Judgement and thought content appropriate.     Lab Results:  CBC    Component Value Date/Time   WBC 3.8 (L) 11/17/2022 0908   WBC 2.0 (L) 04/12/2022 1210   RBC 3.11 (L) 11/17/2022 0908   HGB 10.9 (L) 11/17/2022 0908   HCT 32.3 (L) 11/17/2022 0908   PLT 214 11/17/2022 0908   MCV 103.9 (H) 11/17/2022 0908   MCH 35.0 (H) 11/17/2022 0908   MCHC 33.7 11/17/2022 0908   RDW 15.7 (H) 11/17/2022 0908   LYMPHSABS 0.6 (L) 11/17/2022 0908   MONOABS 0.4 11/17/2022 0908   EOSABS 0.3 11/17/2022 0908   BASOSABS 0.0 11/17/2022 0908    BMET    Component Value Date/Time   NA 140 11/17/2022 0908   K 3.5 11/17/2022 0908   CL 104 11/17/2022 0908   CO2 25 11/17/2022 0908   GLUCOSE 113 (H) 11/17/2022 0908   BUN 11 11/17/2022 0908   CREATININE 0.90 11/17/2022 0908   CALCIUM 9.2 11/17/2022 0908   GFRNONAA >60 11/17/2022 0908   GFRAA >90 04/03/2013 1418    BNP No results found for: "BNP"   Imaging:  No results found.  cyanocobalamin (VITAMIN B12) injection 1,000 mcg     Date Action Dose Route User   10/06/2022 1211 Given 1,000 mcg Intramuscular (Left Deltoid) Julieanne Manson, RN      heparin lock flush 100  unit/mL     Date Action Dose Route User   10/06/2022 1328 Given 500 Units Intracatheter Julieanne Manson, RN      heparin lock flush 100 unit/mL     Date Action Dose Route User   10/27/2022 1202 Given 500 Units Intracatheter Marylouise Stacks, RN      heparin lock flush 100 unit/mL     Date Action Dose Route User   11/17/2022 1247 Given 500 Units Intracatheter Neshanic, Sullivan, RN      influenza vac split trivalent PF (FLULAVAL) injection 0.5 mL     Date Action Dose Route User   11/17/2022 1241 Given 0.5 mL Intramuscular (Left Deltoid) Sabino Snipes, RN      pembrolizumab [facility]) 200 mg in sodium chloride 0.9 % 50 mL chemo infusion     Date Action Dose Route User   10/06/2022 1234 Infusion Verify (none) Intravenous Julieanne Manson, RN   10/06/2022 1234 Rate/Dose Change (none) Intravenous Julieanne Manson, RN   10/06/2022 1234 New Bag/Given 200 mg Intravenous Julieanne Manson, RN      pembrolizumab [facility]) 200 mg in sodium chloride 0.9 % 50 mL chemo infusion     Date Action Dose Route User   10/27/2022 1100 Infusion Verify (none) Intravenous Marylouise Stacks, RN   10/27/2022 1100 Rate/Dose Change (none) Intravenous Marylouise Stacks, RN   10/27/2022 1100 New Bag/Given 200 mg Intravenous Arville Care, RN      pembrolizumab [facility]) 200 mg in sodium chloride 0.9 % 50 mL chemo infusion     Date Action Dose Route User   11/17/2022 1200 Rate/Dose Change (none) Intravenous Sabino Snipes, RN   11/17/2022 1200 Rate/Dose Change (none) Intravenous Sabino Snipes, RN   11/17/2022 1130 New Bag/Given  200 mg Intravenous Sabino Snipes, RN      PEMEtrexed (ALIMTA) 600 mg in sodium chloride 0.9 % 100 mL chemo infusion     Date Action Dose Route User   10/06/2022 1311 Infusion Verify (none) Intravenous Julieanne Manson, RN   10/06/2022 1311 New Bag/Given 600 mg Intravenous Julieanne Manson, RN      PEMEtrexed (ALIMTA) 600 mg in sodium chloride 0.9 % 100 mL chemo  infusion     Date Action Dose Route User   10/27/2022 1144 Infusion Verify (none) Intravenous Marylouise Stacks, RN   10/27/2022 1144 Infusion Verify (none) Intravenous Marylouise Stacks, RN   10/27/2022 1142 New Bag/Given 600 mg Intravenous Arville Care, RN      PEMEtrexed (ALIMTA) 600 mg in sodium chloride 0.9 % 100 mL chemo infusion     Date Action Dose Route User   11/17/2022 1222 Infusion Verify (none) Intravenous Sabino Snipes, RN   11/17/2022 1214 Rate/Dose Change (none) Intravenous Sabino Snipes, RN   11/17/2022 1214 New Bag/Given 600 mg Intravenous Sedonia Small, RN      prochlorperazine (COMPAZINE) tablet 10 mg     Date Action Dose Route User   10/06/2022 1211 Given 10 mg Oral Julieanne Manson, RN      prochlorperazine (COMPAZINE) tablet 10 mg     Date Action Dose Route User   10/27/2022 1011 Given 10 mg Oral Arville Care, RN      prochlorperazine (COMPAZINE) tablet 10 mg     Date Action Dose Route User   11/17/2022 1112 Given 10 mg Oral Sabino Snipes, RN      0.9 %  sodium chloride infusion     Date Action Dose Route User   10/06/2022 1328 Rate/Dose Change (none) Intravenous Julieanne Manson, RN   10/06/2022 1325 Rate/Dose Change (none) Intravenous Julieanne Manson, RN   10/06/2022 1313 Rate/Dose Change (none) Intravenous Julieanne Manson, RN   10/06/2022 1309 Rate/Dose Change (none) Intravenous Julieanne Manson, RN   10/06/2022 1244 Infusion Verify (none) Intravenous Julieanne Manson, RN      0.9 %  sodium chloride infusion     Date Action Dose Route User   10/27/2022 1011 Infusion Verify (none) Intravenous Marylouise Stacks, RN   10/27/2022 1007 New Bag/Given (none) Intravenous Arville Care, RN      0.9 %  sodium chloride infusion     Date Action Dose Route User   11/17/2022 1231 Rate/Dose Change (none) Intravenous Sabino Snipes, RN   11/17/2022 1226 Rate/Dose Change (none) Intravenous Sabino Snipes, RN   11/17/2022 1214 Rate/Dose  Change (none) Intravenous Sabino Snipes, RN   11/17/2022 1213 Rate/Dose Change (none) Intravenous Sabino Snipes, RN   11/17/2022 1210 Rate/Dose Change (none) Intravenous Sabino Snipes, RN      sodium chloride flush (NS) 0.9 % injection 10 mL     Date Action Dose Route User   10/06/2022 1051 Given 10 mL Intracatheter Cates, Porsche L, LPN      sodium chloride flush (NS) 0.9 % injection 10 mL     Date Action Dose Route User   10/06/2022 1329 Given 10 mL Intracatheter Julieanne Manson, RN      sodium chloride flush (NS) 0.9 % injection 10 mL     Date Action Dose Route User   10/27/2022 0849 Given 10 mL Intracatheter Terald Sleeper B, LPN      sodium chloride flush (NS) 0.9 % injection 10 mL  Date Action Dose Route User   10/27/2022 1202 Given 10 mL Intracatheter Marylouise Stacks, RN      sodium chloride flush (NS) 0.9 % injection 10 mL     Date Action Dose Route User   11/17/2022 1247 Given 10 mL Intracatheter Sabino Snipes, RN          Latest Ref Rng & Units 06/25/2021    3:34 PM  PFT Results  FVC-Pre L 2.97   FVC-Predicted Pre % 94   FVC-Post L 2.91   FVC-Predicted Post % 92   Pre FEV1/FVC % % 73   Post FEV1/FCV % % 76   FEV1-Pre L 2.16   FEV1-Predicted Pre % 88   FEV1-Post L 2.20   DLCO uncorrected ml/min/mmHg 13.46   DLCO UNC% % 70   DLCO corrected ml/min/mmHg 14.06   DLCO COR %Predicted % 73   DLVA Predicted % 82   TLC L 4.43   TLC % Predicted % 93   RV % Predicted % 63     No results found for: "NITRICOXIDE"      Assessment & Plan:   COPD (chronic obstructive pulmonary disease) (HCC) Compensated on current regimen.  She has had 1 exacerbation requiring steroids this year; recovered well.  Encouraged to remain smoke-free.  Activity as tolerated.  Action plan in place.  Patient Instructions  Continue Albuterol inhaler 2 puffs or 3 mL neb every 6 hours as needed for shortness of breath or wheezing. Notify if symptoms persist despite  rescue inhaler/neb use.  Continue Stiolto 2 puffs daily Continue astelin 1 spray each nostril Twice daily  Continue zyrtec 1 tab daily Continue flonase nasal spray 2 sprays each nostril daily  Attend appointment with oncology as scheduled  Follow up in 6 months with Dr. Delton Coombes as scheduled. If symptoms worsen, please contact office for sooner follow up or seek emergency care.    Adenocarcinoma of right lung, stage 4 (HCC) Awaiting results of surveillance CT. Has not been read yet. She has follow up 11/6 to discuss. On maintenance with Alimta and Keytruda.    I spent 28 minutes of dedicated to the care of this patient on the date of this encounter to include pre-visit review of records, face-to-face time with the patient discussing conditions above, post visit ordering of testing, clinical documentation with the electronic health record, making appropriate referrals as documented, and communicating necessary findings to members of the patients care team.  Noemi Chapel, NP 12/03/2022  Pt aware and understands NP's role.

## 2022-12-03 NOTE — Assessment & Plan Note (Signed)
Awaiting results of surveillance CT. Has not been read yet. She has follow up 11/6 to discuss. On maintenance with Alimta and Keytruda.

## 2022-12-08 ENCOUNTER — Inpatient Hospital Stay: Payer: Commercial Managed Care - PPO

## 2022-12-08 ENCOUNTER — Inpatient Hospital Stay (HOSPITAL_BASED_OUTPATIENT_CLINIC_OR_DEPARTMENT_OTHER): Payer: Commercial Managed Care - PPO | Admitting: Physician Assistant

## 2022-12-08 ENCOUNTER — Encounter: Payer: Self-pay | Admitting: Internal Medicine

## 2022-12-08 VITALS — Temp 98.5°F

## 2022-12-08 VITALS — BP 114/68 | HR 100 | Temp 99.8°F | Resp 18 | Ht 62.0 in | Wt 105.3 lb

## 2022-12-08 DIAGNOSIS — Z5112 Encounter for antineoplastic immunotherapy: Secondary | ICD-10-CM | POA: Insufficient documentation

## 2022-12-08 DIAGNOSIS — Z5111 Encounter for antineoplastic chemotherapy: Secondary | ICD-10-CM | POA: Insufficient documentation

## 2022-12-08 DIAGNOSIS — C770 Secondary and unspecified malignant neoplasm of lymph nodes of head, face and neck: Secondary | ICD-10-CM | POA: Insufficient documentation

## 2022-12-08 DIAGNOSIS — R61 Generalized hyperhidrosis: Secondary | ICD-10-CM | POA: Insufficient documentation

## 2022-12-08 DIAGNOSIS — C50912 Malignant neoplasm of unspecified site of left female breast: Secondary | ICD-10-CM | POA: Diagnosis not present

## 2022-12-08 DIAGNOSIS — C3411 Malignant neoplasm of upper lobe, right bronchus or lung: Secondary | ICD-10-CM | POA: Insufficient documentation

## 2022-12-08 DIAGNOSIS — Z95828 Presence of other vascular implants and grafts: Secondary | ICD-10-CM

## 2022-12-08 DIAGNOSIS — Z510 Encounter for antineoplastic radiation therapy: Secondary | ICD-10-CM | POA: Diagnosis present

## 2022-12-08 DIAGNOSIS — C7951 Secondary malignant neoplasm of bone: Secondary | ICD-10-CM | POA: Insufficient documentation

## 2022-12-08 DIAGNOSIS — C3491 Malignant neoplasm of unspecified part of right bronchus or lung: Secondary | ICD-10-CM

## 2022-12-08 DIAGNOSIS — Z79620 Long term (current) use of immunosuppressive biologic: Secondary | ICD-10-CM | POA: Insufficient documentation

## 2022-12-08 DIAGNOSIS — C778 Secondary and unspecified malignant neoplasm of lymph nodes of multiple regions: Secondary | ICD-10-CM | POA: Diagnosis not present

## 2022-12-08 DIAGNOSIS — T451X5A Adverse effect of antineoplastic and immunosuppressive drugs, initial encounter: Secondary | ICD-10-CM

## 2022-12-08 LAB — CMP (CANCER CENTER ONLY)
ALT: 47 U/L — ABNORMAL HIGH (ref 0–44)
AST: 43 U/L — ABNORMAL HIGH (ref 15–41)
Albumin: 3.8 g/dL (ref 3.5–5.0)
Alkaline Phosphatase: 117 U/L (ref 38–126)
Anion gap: 8 (ref 5–15)
BUN: 9 mg/dL (ref 6–20)
CO2: 27 mmol/L (ref 22–32)
Calcium: 9.6 mg/dL (ref 8.9–10.3)
Chloride: 106 mmol/L (ref 98–111)
Creatinine: 0.81 mg/dL (ref 0.44–1.00)
GFR, Estimated: >60 mL/min (ref >60)
Glucose, Bld: 90 mg/dL (ref 70–99)
Potassium: 3.6 mmol/L (ref 3.5–5.1)
Sodium: 141 mmol/L (ref 135–145)
Total Bilirubin: 0.4 mg/dL (ref <1.2)
Total Protein: 7.1 g/dL (ref 6.5–8.1)

## 2022-12-08 LAB — CBC WITH DIFFERENTIAL (CANCER CENTER ONLY)
Abs Immature Granulocytes: 0 10*3/uL (ref 0.00–0.07)
Basophils Absolute: 0 10*3/uL (ref 0.0–0.1)
Basophils Relative: 1 %
Eosinophils Absolute: 0.2 10*3/uL (ref 0.0–0.5)
Eosinophils Relative: 7 %
HCT: 33.6 % — ABNORMAL LOW (ref 36.0–46.0)
Hemoglobin: 11.3 g/dL — ABNORMAL LOW (ref 12.0–15.0)
Immature Granulocytes: 0 %
Lymphocytes Relative: 19 %
Lymphs Abs: 0.6 10*3/uL — ABNORMAL LOW (ref 0.7–4.0)
MCH: 35.4 pg — ABNORMAL HIGH (ref 26.0–34.0)
MCHC: 33.6 g/dL (ref 30.0–36.0)
MCV: 105.3 fL — ABNORMAL HIGH (ref 80.0–100.0)
Monocytes Absolute: 0.4 10*3/uL (ref 0.1–1.0)
Monocytes Relative: 14 %
Neutro Abs: 1.9 10*3/uL (ref 1.7–7.7)
Neutrophils Relative %: 59 %
Platelet Count: 192 10*3/uL (ref 150–400)
RBC: 3.19 MIL/uL — ABNORMAL LOW (ref 3.87–5.11)
RDW: 15.2 % (ref 11.5–15.5)
WBC Count: 3.2 10*3/uL — ABNORMAL LOW (ref 4.0–10.5)
nRBC: 0 % (ref 0.0–0.2)

## 2022-12-08 LAB — TSH: TSH: 3.771 u[IU]/mL (ref 0.350–4.500)

## 2022-12-08 LAB — SAMPLE TO BLOOD BANK

## 2022-12-08 MED ORDER — HEPARIN SOD (PORK) LOCK FLUSH 100 UNIT/ML IV SOLN
500.0000 [IU] | Freq: Once | INTRAVENOUS | Status: AC | PRN
Start: 2022-12-08 — End: 2022-12-08
  Administered 2022-12-08: 500 [IU]

## 2022-12-08 MED ORDER — SODIUM CHLORIDE 0.9 % IV SOLN
200.0000 mg | Freq: Once | INTRAVENOUS | Status: AC
  Administered 2022-12-08: 200 mg via INTRAVENOUS
  Filled 2022-12-08: qty 200

## 2022-12-08 MED ORDER — SODIUM CHLORIDE 0.9 % IV SOLN: Freq: Once | INTRAVENOUS | Status: AC

## 2022-12-08 MED ORDER — SODIUM CHLORIDE 0.9% FLUSH
10.0000 mL | INTRAVENOUS | Status: DC | PRN
Start: 2022-12-08 — End: 2022-12-08
  Administered 2022-12-08: 10 mL

## 2022-12-08 MED ORDER — SODIUM CHLORIDE 0.9% FLUSH
10.0000 mL | Freq: Once | INTRAVENOUS | Status: AC
  Administered 2022-12-08: 10 mL

## 2022-12-08 MED ORDER — PROCHLORPERAZINE MALEATE 10 MG PO TABS
10.0000 mg | ORAL_TABLET | Freq: Once | ORAL | Status: AC
Start: 2022-12-08 — End: 2022-12-08
  Administered 2022-12-08: 10 mg via ORAL
  Filled 2022-12-08: qty 1

## 2022-12-08 MED ORDER — PEMETREXED DISODIUM CHEMO INJECTION 500 MG
400.0000 mg/m2 | Freq: Once | INTRAVENOUS | Status: AC
  Administered 2022-12-08: 600 mg via INTRAVENOUS
  Filled 2022-12-08: qty 20

## 2022-12-08 MED ORDER — CYANOCOBALAMIN 1000 MCG/ML IJ SOLN
1000.0000 ug | Freq: Once | INTRAMUSCULAR | Status: AC
  Administered 2022-12-08: 1000 ug via INTRAMUSCULAR
  Filled 2022-12-08: qty 1

## 2022-12-08 NOTE — Patient Instructions (Signed)
[facility] - A DEPT OF [facility]  Discharge Instructions: Thank you for choosing [facility] to provide your oncology and hematology care.   If you have a lab appointment with the Cancer Center, please go directly to the Cancer Center and check in at the registration area.   Wear comfortable clothing and clothing appropriate for easy access to any Portacath or PICC line.   We strive to give you quality time with your provider. You may need to reschedule your appointment if you arrive late (15 or more minutes).  Arriving late affects you and other patients whose appointments are after yours.  Also, if you miss three or more appointments without notifying the office, you may be dismissed from the clinic at the provider's discretion.      For prescription refill requests, have your pharmacy contact our office and allow 72 hours for refills to be completed.    Today you received the following chemotherapy and/or immunotherapy agents: Keytruda, Alimta.       To help prevent nausea and vomiting after your treatment, we encourage you to take your nausea medication as directed.  BELOW ARE SYMPTOMS THAT SHOULD BE REPORTED IMMEDIATELY: *FEVER GREATER THAN 100.4 F (38 C) OR HIGHER *CHILLS OR SWEATING *NAUSEA AND VOMITING THAT IS NOT CONTROLLED WITH YOUR NAUSEA MEDICATION *UNUSUAL SHORTNESS OF BREATH *UNUSUAL BRUISING OR BLEEDING *URINARY PROBLEMS (pain or burning when urinating, or frequent urination) *BOWEL PROBLEMS (unusual diarrhea, constipation, pain near the anus) TENDERNESS IN MOUTH AND THROAT WITH OR WITHOUT PRESENCE OF ULCERS (sore throat, sores in mouth, or a toothache) UNUSUAL RASH, SWELLING OR PAIN  UNUSUAL VAGINAL DISCHARGE OR ITCHING   Items with * indicate a potential emergency and should be followed up as soon as possible or go to the Emergency Department if any problems should occur.  Please show the CHEMOTHERAPY ALERT CARD or  IMMUNOTHERAPY ALERT CARD at check-in to the Emergency Department and triage nurse.  Should you have questions after your visit or need to cancel or reschedule your appointment, please contact [facility] - A DEPT OF [facility]  Dept: [phone]  and follow the prompts.  Office hours are 8:00 a.m. to 4:30 p.m. Monday - Friday. Please note that voicemails left after 4:00 p.m. may not be returned until the following business day.  We are closed weekends and major holidays. You have access to a nurse at all times for urgent questions. Please call the main number to the clinic Dept: [phone] and follow the prompts.   For any non-urgent questions, you may also contact your provider using MyChart. We now offer e-Visits for anyone 80 and older to request care online for non-urgent symptoms. For details visit [email or web address].   Also download the MyChart app! Go to the app store, search "MyChart", open the app, select [facility], and log in with your MyChart username and password.

## 2022-12-09 ENCOUNTER — Telehealth: Payer: Self-pay | Admitting: *Deleted

## 2022-12-09 ENCOUNTER — Encounter: Payer: Self-pay | Admitting: Internal Medicine

## 2022-12-09 LAB — T4: T4, Total: 9.7 ug/dL (ref 4.5–12.0)

## 2022-12-09 NOTE — Telephone Encounter (Signed)
Successfully faxed Disability form.  Patient notified to pick up 12/10/2022 scheduled radiotherapy.  Copy to [facility] H.I.M bin for items to be scanned.

## 2022-12-09 NOTE — Telephone Encounter (Signed)
[employer] disability update completed by this nurse to provider for review, signature and return to this nurse to complete processing.

## 2022-12-09 NOTE — Progress Notes (Signed)
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 Yes Pacemaker/ICD? No Possible current pregnancy? No Is the patient on methotrexate? No  Current Complaints / other details:       BP 102/60 (BP Location: Left Arm, Patient Position: Sitting)   Pulse (!) 103   Temp (!) 97.5 F (36.4 C) (Temporal)   Resp 18   Ht 5\' 2"  (1.575 m)   Wt 105 lb (47.6 kg)   SpO2 100%   BMI 19.20 kg/m

## 2022-12-10 ENCOUNTER — Encounter: Payer: Self-pay | Admitting: Internal Medicine

## 2022-12-10 ENCOUNTER — Other Ambulatory Visit: Payer: Self-pay | Admitting: Physician Assistant

## 2022-12-10 ENCOUNTER — Other Ambulatory Visit: Payer: Self-pay | Admitting: Medical Oncology

## 2022-12-10 DIAGNOSIS — C3411 Malignant neoplasm of upper lobe, right bronchus or lung: Secondary | ICD-10-CM

## 2022-12-11 NOTE — Progress Notes (Signed)
Radiation Oncology         [phone] ________________________________  Outpatient Re-Consultation  Name: BRENLYN BAXENDALE MRN: [id number]  Date: 12/13/2022  DOB: 13-Apr-1965  CC:Elie Confer, NP  Si Gaul, MD   REFERRING PHYSICIAN: Si Gaul, MD  DIAGNOSIS: {There were no encounter diagnoses. (Refresh or delete this SmartLink)}  Recurrent locally advanced non-small cell lung cancer initially diagnosed as stage IIIB  (T1b, N3, M0) non-small cell lung cancer, adenocarcinoma she presented with right upper lobe nodule in addition to bulky right hilar, mediastinal, and left supraclavicular lymphadenopathy diagnosed in November 2022.  The patient had evidence for disease recurrence in the mediastinal and right supraclavicular lymphadenopathy in August 2023.  Interval Since Last Radiation: 1 year, 9 months, and 23 days   Intent: Curative  Radiation Treatment Dates: 01/07/2021 through 02/19/2021 Site Technique Total Dose (Gy) Dose per Fx (Gy) Completed Fx Beam Energies  Lung, Right: Lung_Rt 3D 60/60 2 30/30 6X    HISTORY OF PRESENT ILLNESS::Carita J Istvan is a 57 y.o. female who is seen as a courtesy of Dr. Arbutus Ped for re-evaluation and an opinion concerning palliative radiation therapy as part of management for her recently diagnosed nodal metastases from [facility] right lung cancer primary. I last met with the patient for follow up on 03/23/21 1 month after radiation to the right lung. Since that time, the patient continued on with observation and consolidation immunotherapy consisting of Imfinzi 4 weeks. X 3 cycles under Dr. Arbutus Ped. This was discontinued following cycle 3 secondary to suspicious immunotherapy mediated pneumonitis.  The patient recently presented for a follow up chest CT on 09/07/21 which unfortunately showed new significant enlargement of mediastinal and low right cervical lymph nodes, consistent with worsened nodal metastatic disease. 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  (CTA of the chest on 05/27/21 showed no evidence suggestive of lymphadenopathy, and normal radiation changes in the right upper and lower lobes).  Accordingly, the patient followed up with Dr. Arbutus Ped on 09/09/21. On examination, the patient was noted to have palpable right cervical lymphadenopathy. Given recent findings, Dr. Arbutus Ped recommended further work-up consisting of a PET scan and possible nodal biopsies.   Restaging PET scan on 09/16/21 showed the mediastinal, right hilar and bilateral supraclavicular lymph nodes as hypermetabolic, and an interval increase in size of the largest right supraclavicular lymph node compared to recent CT dated 09/07/21. New evidence of central necrosis was also noted to the large right supraclavicular lymph node. PET otherwise showed no evidence of metastatic disease in the abdomen or pelvis. (PET also redemonstrated right upper lobe postradiation changes).   During her most recent follow up with Dr. Arbutus Ped on 09/22/21, the patient endorsed increasing fatigue and weakness and anxiety about her recent disease recurrence. She otherwise denied any respiratory symptoms, nausea, vomiting, diarrhea, weight loss, or headaches. Given findings on her most recent PET scan, Dr. Arbutus Ped would like to proceed with ultrasound-guided core biopsy of one of the right supraclavicular lymph nodes for confirmation of her tissue diagnosis and to rule out any other different histology. Pending our discussion today, the patient may also return to Dr. Arbutus Ped for consideration of concurrent chemotherapy.   The patient is scheduled to undergo nodal biopsies on 10/01/21 with Dr. Arbutus Ped. She  also has an MRI of the  brain scheduled for 09/30/21.    PAST MEDICAL HISTORY:  Past Medical History:  Diagnosis Date   Abdominal discomfort    Cancer (HCC)    Chronic headaches    due to allergies, sinus   COPD (chronic obstructive pulmonary disease) (HCC)    per 2012 chest xray   pt states she doesn not have this now (04/10/2013)   Deviated nasal septum    Eustachian tube dysfunction    GERD (gastroesophageal reflux disease)    occasional uses Tums / Rolaids   Hearing loss    right ear   High cholesterol    History of radiation therapy    right lung 01/07/2021-02/19/2021  Dr Antony Blackbird   Migraine    "only once in a blue moon since RX'd allergy shots" (04/10/2013)   Pancreatitis 02/08/2013   Pneumonia    Rhinitis, allergic     PAST SURGICAL HISTORY: Past Surgical History:  Procedure Laterality Date   ABDOMINAL HYSTERECTOMY  1995   tx endometriosis, both ovaries removed   ADENOIDECTOMY     APPENDECTOMY  late 1990's   BIOPSY  02/26/2021   Procedure: BIOPSY;  Surgeon: Rachael Fee, MD;  Location: [facility] ENDOSCOPY;  Service: Endoscopy;;   BRONCHIAL BRUSHINGS  12/15/2020   Procedure: BRONCHIAL BRUSHINGS;  Surgeon: Leslye Peer, MD;  Location: [facility] ENDOSCOPY;  Service: Cardiopulmonary;;   BRONCHIAL NEEDLE ASPIRATION BIOPSY  12/15/2020   Procedure: BRONCHIAL NEEDLE ASPIRATION BIOPSIES;  Surgeon: Leslye Peer, MD;  Location: [facility] ENDOSCOPY;  Service: Cardiopulmonary;;   CHOLECYSTECTOMY  04/10/2013   CHOLECYSTECTOMY N/A 04/10/2013   Procedure: LAPAROSCOPIC CHOLECYSTECTOMY WITH INTRAOPERATIVE CHOLANGIOGRAM;  Surgeon: Adolph Pollack, MD;  Location: [facility] OR;  Service: General;  Laterality: N/A;   ELECTROMAGNETIC NAVIGATION BROCHOSCOPY  12/15/2020   Procedure: ELECTROMAGNETIC NAVIGATION BRONCHOSCOPY;  Surgeon: Leslye Peer, MD;  Location: [facility] ENDOSCOPY;  Service: Cardiopulmonary;;   ESOPHAGOGASTRODUODENOSCOPY (EGD) WITH PROPOFOL N/A 02/26/2021   Procedure:  ESOPHAGOGASTRODUODENOSCOPY (EGD) WITH PROPOFOL;  Surgeon: Rachael Fee, MD;  Location: [facility] ENDOSCOPY;  Service: Endoscopy;  Laterality: N/A;   EUS N/A 02/16/2013   Procedure: UPPER ENDOSCOPIC ULTRASOUND (EUS) LINEAR;  Surgeon: Rachael Fee, MD;  Location: [facility] ENDOSCOPY;  Service: Endoscopy;  Laterality: N/A;  radial linear   IR IMAGING GUIDED PORT INSERTION  06/30/2022   KNEE ARTHROSCOPY Right 1980's   "cartilage OR"   LAPAROSCOPIC ENDOMETRIOSIS FULGURATION  1980's   MYRINGOTOMY WITH TUBE PLACEMENT Right 07/13/2018   Procedure: MYRINGOTOMY WITH TUBE PLACEMENT;  Surgeon: Vernie Murders, MD;  Location: [facility];  Service: ENT;  Laterality: Right;   MYRINGOTOMY WITH TUBE PLACEMENT Right 06/04/2021   Procedure: MYRINGOTOMY WITH BUTTERFLY TUBE PLACEMENT;  Surgeon: Vernie Murders, MD;  Location: [facility];  Service: ENT;  Laterality: Right;   NASOPHARYNGOSCOPY EUSTATION TUBE BALLOON DILATION Right 07/13/2018   Procedure: NASOPHARYNGOSCOPY EUSTATION TUBE BALLOON DILATION;  Surgeon: Vernie Murders, MD;  Location: [facility];  Service: ENT;  Laterality: Right;   TONSILLECTOMY AND ADENOIDECTOMY  ~ 1980   adenoidectomy   TUBAL LIGATION  ~ 1987   TURBINATE REDUCTION Right 07/13/2018   Procedure: OUTFRACTURE TURBINATE;  Surgeon: Vernie Murders, MD;  Location: [facility];  Service: ENT;  Laterality: Right;   TYMPANOSTOMY TUBE PLACEMENT     VIDEO BRONCHOSCOPY WITH ENDOBRONCHIAL ULTRASOUND N/A 12/15/2020   Procedure: ROBOTIC VIDEO BRONCHOSCOPY WITH ENDOBRONCHIAL ULTRASOUND;  Surgeon: Leslye Peer, MD;  Location: [facility] ENDOSCOPY;  Service: Cardiopulmonary;  Laterality: N/A;   WRIST SURGERY Left  w/plate    FAMILY HISTORY:  Family History  Problem Relation Age of Onset   Diabetes Father    Diabetes Mother    COPD Mother    COPD Maternal Grandfather    COPD Maternal Aunt    COPD Maternal Uncle     SOCIAL HISTORY:  Social History   Tobacco Use   Smoking  status: Former    Current packs/day: 0.00    Average packs/day: 0.5 packs/day for 41.0 years (20.5 ttl pk-yrs)    Types: Cigarettes    Start date: 12/24/1979    Quit date: 12/23/2020    Years since quitting: 1.9    Passive exposure: Current (Husband)   Smokeless tobacco: Never  Vaping Use   Vaping status: Former  Substance Use Topics   Alcohol use: Not Currently    Comment: occasional   Drug use: No    ALLERGIES:  Allergies  Allergen Reactions   Carboplatin Shortness Of Breath, Itching and Cough    See progress note from 06/16/22    MEDICATIONS:  Current Outpatient Medications  Medication Sig Dispense Refill   acetaminophen (TYLENOL) 500 MG tablet Take 1,000 mg by mouth every 6 (six) hours as needed for moderate pain.     albuterol (PROVENTIL) (2.5 MG/3ML) 0.083% nebulizer solution albuterol sulfate 2.5 mg/3 mL (0.083 %) solution for nebulization  USE 1 VIAL IN NEBULIZER EVERY 6 HOURS AS NEEDED FOR WHEEZING OR SHORTNESS OF BREATH     albuterol (VENTOLIN HFA) 108 (90 Base) MCG/ACT inhaler Inhale 1-2 puffs into the lungs every 6 (six) hours as needed for wheezing or shortness of breath. 18 g 3   aspirin EC 81 MG tablet Take 81 mg by mouth at bedtime.     atorvastatin (LIPITOR) 40 MG tablet Take 40 mg by mouth at bedtime.     azelastine (ASTELIN) 0.1 % nasal spray Place 1 spray into both nostrils 2 (two) times daily as needed. Use in each nostril as directed 30 mL 5   benzonatate (TESSALON) 200 MG capsule Take 1 capsule (200 mg total) by mouth 3 (three) times daily as needed. 30 capsule 0   Biotin 5000 MCG TABS Take 5,000 mcg by mouth at bedtime.     buPROPion (WELLBUTRIN XL) 150 MG 24 hr tablet Take 150 mg by mouth every morning.     Ca Carbonate-Mag Hydroxide (ROLAIDS PO) Take 1 tablet by mouth daily as needed (heartburn).     cetirizine (ZYRTEC ALLERGY) 10 MG tablet Take 1 tablet (10 mg total) by mouth daily as needed for rhinitis. 30 tablet 5   doxycycline (VIBRAMYCIN) 100 MG  capsule Take 100 mg by mouth 2 (two) times daily.     famotidine (PEPCID) 20 MG tablet Take 1 tablet by mouth twice daily 60 tablet 0   fluticasone (FLONASE) 50 MCG/ACT nasal spray Place 2 sprays into both nostrils daily. 16 g 5   folic acid (FOLVITE) 1 MG tablet Take 1 tablet by mouth daily 30 tablet 0   gabapentin (NEURONTIN) 300 MG capsule Take 300 mg by mouth.     hydrocortisone 1 % ointment Apply 1 Application topically 2 (two) times daily. 30 g 0   lidocaine-prilocaine (EMLA) cream APPLY TO PORT-A-CATH 30 TO 60 MINUTES BEFORE TREATMENT 30 g 0   linaCLOtide (LINZESS PO) Take by mouth.     Multiple Vitamins-Minerals (HAIR SKIN AND NAILS FORMULA PO) Take 1 tablet by mouth daily.     ondansetron (ZOFRAN-ODT) 8 MG disintegrating tablet Take 1 tablet (8  mg total) by mouth 3 (three) times daily as needed. 20 tablet 1   Oxycodone HCl 10 MG TABS Take 1 tablet by mouth 3 (three) times daily as needed (pain).     PARoxetine (PAXIL) 10 MG tablet Take 10 mg by mouth daily.     prochlorperazine (COMPAZINE) 10 MG tablet Take 1 tablet (10 mg total) by mouth every 6 (six) hours as needed. 30 tablet 2   rizatriptan (MAXALT-MLT) 10 MG disintegrating tablet Take 10 mg by mouth as needed for migraine. May repeat in 2 hours if needed     sertraline (ZOLOFT) 50 MG tablet Take 1 tablet by mouth daily.     Tiotropium Bromide-Olodaterol (STIOLTO RESPIMAT) 2.5-2.5 MCG/ACT AERS Inhale 2 puffs into the lungs daily. 12 g 3   No current facility-administered medications for this encounter.    REVIEW OF SYSTEMS:  A 10+ POINT REVIEW OF SYSTEMS WAS OBTAINED including neurology, dermatology, psychiatry, cardiac, respiratory, lymph, extremities, GI, GU, musculoskeletal, constitutional, reproductive, HEENT.  She reports significant discomfort along the right supraclavicular adenopathy.  She reports that this area has decreased in size after placement on antibiotics and steroids but continues to be quite tender.   PHYSICAL  EXAM:  vitals were not taken for this visit.   General: Alert and oriented, in no acute distress HEENT: Head is normocephalic. Extraocular movements are intact.  Neck: Neck is supple, no palpable cervical or supraclavicular lymphadenopathy. Heart: Regular in rate and rhythm with no murmurs, rubs, or gallops. Chest: Clear to auscultation bilaterally, with no rhonchi, wheezes, or rales. Abdomen: Soft, nontender, nondistended, with no rigidity or guarding. Extremities: No cyanosis or edema. Lymphatics: She has palpable adenopathy in the right subclavicular area which is exquisitely tender with palpation.  Difficult to estimate size given her sensitivity Skin: No concerning lesions. Musculoskeletal: symmetric strength and muscle tone throughout. Neurologic: Cranial nerves II through XII are grossly intact. No obvious focalities. Speech is fluent. Coordination is intact. Psychiatric: Judgment and insight are intact. Affect is appropriate.   ECOG = 1  0 - Asymptomatic (Fully active, able to carry on all predisease activities without restriction)  1 - Symptomatic but completely ambulatory (Restricted in physically strenuous activity but ambulatory and able to carry out work of a light or sedentary nature. For example, light housework, office work)  2 - Symptomatic, <50% in bed during the day (Ambulatory and capable of all self care but unable to carry out any work activities. Up and about more than 50% of waking hours)  3 - Symptomatic, >50% in bed, but not bedbound (Capable of only limited self-care, confined to bed or chair 50% or more of waking hours)  4 - Bedbound (Completely disabled. Cannot carry on any self-care. Totally confined to bed or chair)  5 - Death   Santiago Glad MM, Creech RH, Tormey DC, et al. [id number]). "Toxicity and response criteria of the [facility] Group". Am. Evlyn Clines. Oncol. 5 (6): 649-55  LABORATORY DATA:  Lab Results  Component Value Date   WBC 3.2 (L)  12/08/2022   HGB 11.3 (L) 12/08/2022   HCT 33.6 (L) 12/08/2022   MCV 105.3 (H) 12/08/2022   PLT 192 12/08/2022   NEUTROABS 1.9 12/08/2022   Lab Results  Component Value Date   NA 141 12/08/2022   K 3.6 12/08/2022   CL 106 12/08/2022   CO2 27 12/08/2022   GLUCOSE 90 12/08/2022   BUN 9 12/08/2022   CREATININE 0.81 12/08/2022   CALCIUM 9.6 12/08/2022  RADIOGRAPHY: CT CHEST ABDOMEN PELVIS W CONTRAST  Result Date: 12/08/2022 CLINICAL DATA:  Stage IV adenocarcinoma of the right lung. Restaging. * Tracking Code: BO * EXAM: CT CHEST, ABDOMEN, AND PELVIS WITH CONTRAST TECHNIQUE: Multidetector CT imaging of the chest, abdomen and pelvis was performed following the standard protocol during bolus administration of intravenous contrast. RADIATION DOSE REDUCTION: This exam was performed according to the departmental dose-optimization program which includes automated exposure control, adjustment of the mA and/or kV according to patient size and/or use of iterative reconstruction technique. CONTRAST:  OMNIPAQUE IOHEXOL 300 MG/ML  SOLN COMPARISON:  09/13/2022 FINDINGS: CT CHEST FINDINGS Cardiovascular: The heart size is normal. No substantial pericardial effusion. No thoracic aortic aneurysm. Right Port-A-Cath tip is positioned in the distal SVC. Mediastinum/Nodes: Confluent soft tissue attenuation in the right hilar region is stable compatible with treatment related change. There is no mediastinal or hilar lymphadenopathy. Small distal paraesophageal lymph node is stable. Mild circumferential wall thickening noted mid esophagus, similar to prior and potentially secondary to therapy. There is no axillary lymphadenopathy. Lungs/Pleura: Centrilobular and paraseptal emphysema evident. Treatment related parahilar scarring in the right lung is stable. No new suspicious pulmonary nodule or mass. No pleural effusion. Musculoskeletal: No worrisome lytic or sclerotic osseous abnormality. CT ABDOMEN PELVIS  FINDINGS Hepatobiliary: No suspicious focal abnormality within the liver parenchyma. Cholecystectomy. Stable intra and extrahepatic biliary duct dilatation. Common bile duct diameter 8 mm in the head of the pancreas. Pancreas: Stable distention of the main pancreatic duct through the head and body of the pancreas. No pancreatic mass lesion evident. Spleen: No splenomegaly. No suspicious focal mass lesion. Adrenals/Urinary Tract: No adrenal nodule or mass. Stable small cyst interpolar right kidney. Tiny hypoattenuating lesions upper pole left kidney are too small to characterize but statistically most likely benign. No followup imaging is recommended. No evidence for hydroureter. The urinary bladder appears normal for the degree of distention. Stomach/Bowel: Stomach is unremarkable. Mild wall thickening in the antral region of the stomach is stable and nonspecific. Duodenum is normally positioned as is the ligament of Treitz. No small bowel wall thickening. No small bowel dilatation. The terminal ileum is normal. Nonvisualization of the appendix is consistent with the reported history of appendectomy. No gross colonic mass. No colonic wall thickening. Diverticular changes are noted in the left colon without evidence of diverticulitis. Vascular/Lymphatic: There is mild atherosclerotic calcification of the abdominal aorta without aneurysm. There is no gastrohepatic or hepatoduodenal ligament lymphadenopathy. No retroperitoneal or mesenteric lymphadenopathy. No pelvic sidewall lymphadenopathy. Reproductive: There is no adnexal mass. Other: No intraperitoneal free fluid. Musculoskeletal: No worrisome lytic or sclerotic osseous abnormality. IMPRESSION: 1. Stable exam. No new or progressive findings to suggest recurrent or metastatic disease in the chest, abdomen, or pelvis. 2. Stable confluent soft tissue attenuation in the right hilar region compatible with treatment related change. 3. Stable intra and extrahepatic  biliary duct with distention of the main pancreatic duct through the head and body of the pancreas. No pancreatic mass lesion evident. 4. Left colonic diverticulosis without diverticulitis. 5. Aortic Atherosclerosis (ICD10-I70.0) and Emphysema (ICD10-J43.9). Electronically Signed   By: Kennith Center M.D.   On: 12/08/2022 08:11   CT Soft Tissue Neck W Contrast  Result Date: 12/08/2022 CLINICAL DATA:  57 year old female with metastatic lung cancer. History of supraclavicular lymph node metastases. Left level 5 nodal station progression in August. EXAM: CT NECK WITH CONTRAST TECHNIQUE: Multidetector CT imaging of the neck was performed using the standard protocol following the bolus administration of intravenous  contrast. RADIATION DOSE REDUCTION: This exam was performed according to the departmental dose-optimization program which includes automated exposure control, adjustment of the mA and/or kV according to patient size and/or use of iterative reconstruction technique. CONTRAST:  OMNIPAQUE IOHEXOL 300 MG/ML  SOLN COMPARISON:  Neck CTs 09/13/2022 and earlier. CT Chest, Abdomen, and Pelvis reported separately. FINDINGS: Pharynx and larynx: Larynx and pharynx soft tissue contours remain within normal limits. Parapharyngeal and retropharyngeal spaces appear negative. Salivary glands: Negative.  Negative sublingual space. Thyroid: Negative. Lymph nodes: Regressed, now small but heterogeneous bilateral level 4 lymph nodes since August. See series 3 images 66 through 71. Previously seen left side 10 mm node there is now about 5 mm. However, enlarged and partially cystic/necrotic left level IIIb or level 5 lymph node lateral to the external jugular vein on series 3, image 67. Nodal conglomeration there is now 2 cm long axis, versus adjacent subcentimeter lymph nodes there in August. Necrotic node on series 3, image 62. Nearby left level IIIb nodes are smaller on series 3, image 55 (previously up to 12 mm now 7  mm), but heterogeneous. Level 1 and level 2 stations remain spared. Superior level 5 nodes remain normal. Vascular: Chronic right IJ Port-A-Cath. Major vascular structures in the neck and at the skull base remain patent. Limited intracranial: Negative. Visualized orbits: Negative. Mastoids and visualized paranasal sinuses: Stable mild maxillary alveolar recess mucosal thickening. Skeleton: Chronic cervical spine degeneration. Osteopenia. No acute or suspicious osseous lesion in the neck. Upper chest: Dedicated chest CT the same day is reported separately. IMPRESSION: 1. Lower neck metastatic lymph nodes demonstrate mixed response to therapy since August: - progression of a single cluster of partially necrotic lymph nodes just above the left clavicle (series 3 image 63). That nodal conglomeration now 2 cm. - regression of other abnormal left level 3B and bilateral level 4/5 nodes since 09/13/2022, all subcentimeter now. 2. No other metastatic disease identified in the Neck. 3. CT Chest, Abdomen, and Pelvis reported separately. Electronically Signed   By: Odessa Fleming M.D.   On: 12/08/2022 07:59      IMPRESSION: Recurrent locally advanced non-small cell lung cancer initially diagnosed as stage IIIB  (T1b, N3, M0) non-small cell lung cancer, adenocarcinoma she presented with right upper lobe nodule in addition to bulky right hilar, mediastinal, and left supraclavicular lymphadenopathy diagnosed in November 2022.  The patient had evidence for disease recurrence in the mediastinal and right supraclavicular lymphadenopathy in August 2023.  Her most recent PET CT scan shows stability of her disease along the mediastinal and hilar regions.  However she has progression in the right supraclavicular area with increased SUV units.  I have carefully reviewed her previous radiation portals that are relates to her current issues.  She would not be able to receive additional radiation therapy directed at the chest region however  she would be a candidate for radiation treatments to the right supraclavicular region since this area has not deviously received radiation therapy.  She does have a node in the low right supraclavicular region which may be difficult to completely treat given her previous radiation fields.  Today, I talked to the patient and about the findings and work-up thus far.  We discussed the natural history of recurrent lung cancer and general treatment, highlighting the role of radiotherapy in the management.  We discussed the available radiation techniques, and focused on the details of logistics and delivery.  We reviewed the anticipated acute and late sequelae associated with radiation  in this setting.  The patient was encouraged to ask questions that I answered to the best of my ability.  A patient consent form was discussed and signed.  We retained a copy for our records.  The patient would like to proceed with radiation and will be scheduled for CT simulation.  We discussed various scenarios for treatment with her upcoming brain MRI if she found to have brain metastasis.  PLAN: She will return for CT simulation later today.  Anticipate treatment starting in approximately a week.  Given the limited cumulative dose with her upcoming treatment as it relates to her previous radiation therapy she would be a candidate for radiosensitizing chemotherapy and will speak with Dr. Arbutus Ped to see if this would be a possibility.   35 minutes of total time was spent for this patient encounter, including preparation, face-to-face counseling with the patient and coordination of care, physical exam, and documentation of the encounter.   ------------------------------------------------  Billie Lade, PhD, MD  This document serves as a record of services personally performed by Antony Blackbird, MD. It was created on his behalf by Mickie Bail, a trained medical scribe. The creation of this record is based on the  scribe's personal observations and the provider's statements to them. This document has been checked and approved by the attending provider.

## 2022-12-13 ENCOUNTER — Ambulatory Visit
Admission: RE | Admit: 2022-12-13 | Discharge: 2022-12-13 | Disposition: A | Discharge status: Home / Self Care | Payer: Commercial Managed Care - PPO | Source: Ambulatory Visit | Attending: Radiation Oncology | Admitting: Radiation Oncology

## 2022-12-13 ENCOUNTER — Encounter: Payer: Self-pay | Admitting: Radiation Oncology

## 2022-12-13 VITALS — BP 102/60 | HR 103 | Temp 97.5°F | Resp 18 | Ht 62.0 in | Wt 105.0 lb

## 2022-12-13 DIAGNOSIS — C770 Secondary and unspecified malignant neoplasm of lymph nodes of head, face and neck: Secondary | ICD-10-CM | POA: Insufficient documentation

## 2022-12-13 DIAGNOSIS — Z923 Personal history of irradiation: Secondary | ICD-10-CM | POA: Diagnosis not present

## 2022-12-13 DIAGNOSIS — E7800 Pure hypercholesterolemia, unspecified: Secondary | ICD-10-CM | POA: Diagnosis not present

## 2022-12-13 DIAGNOSIS — Z7982 Long term (current) use of aspirin: Secondary | ICD-10-CM | POA: Insufficient documentation

## 2022-12-13 DIAGNOSIS — Z79899 Other long term (current) drug therapy: Secondary | ICD-10-CM | POA: Diagnosis not present

## 2022-12-13 DIAGNOSIS — C3491 Malignant neoplasm of unspecified part of right bronchus or lung: Secondary | ICD-10-CM

## 2022-12-13 DIAGNOSIS — J432 Centrilobular emphysema: Secondary | ICD-10-CM | POA: Diagnosis not present

## 2022-12-13 DIAGNOSIS — R590 Localized enlarged lymph nodes: Secondary | ICD-10-CM | POA: Diagnosis not present

## 2022-12-13 DIAGNOSIS — K769 Liver disease, unspecified: Secondary | ICD-10-CM | POA: Diagnosis not present

## 2022-12-13 DIAGNOSIS — C3411 Malignant neoplasm of upper lobe, right bronchus or lung: Secondary | ICD-10-CM | POA: Insufficient documentation

## 2022-12-13 DIAGNOSIS — Z87891 Personal history of nicotine dependence: Secondary | ICD-10-CM | POA: Insufficient documentation

## 2022-12-13 DIAGNOSIS — K219 Gastro-esophageal reflux disease without esophagitis: Secondary | ICD-10-CM | POA: Diagnosis not present

## 2022-12-13 DIAGNOSIS — J449 Chronic obstructive pulmonary disease, unspecified: Secondary | ICD-10-CM | POA: Diagnosis not present

## 2022-12-13 DIAGNOSIS — C7951 Secondary malignant neoplasm of bone: Secondary | ICD-10-CM | POA: Diagnosis present

## 2022-12-14 ENCOUNTER — Telehealth: Payer: Self-pay

## 2022-12-14 ENCOUNTER — Other Ambulatory Visit: Payer: Self-pay | Admitting: Emergency Medicine

## 2022-12-14 NOTE — Telephone Encounter (Signed)
Patient notified that her documents for [employer] had been completed and faxed to number given. Fax confirmation received. Copy of documents left up front for pick up at visit tomorrow.

## 2022-12-15 ENCOUNTER — Ambulatory Visit
Admission: RE | Admit: 2022-12-15 | Discharge: 2022-12-15 | Disposition: A | Discharge status: Home / Self Care | Payer: Commercial Managed Care - PPO | Source: Ambulatory Visit | Attending: Radiation Oncology | Admitting: Radiation Oncology

## 2022-12-15 VITALS — BP 100/58 | HR 99 | Temp 97.8°F | Resp 18 | Ht 62.0 in

## 2022-12-15 DIAGNOSIS — C3491 Malignant neoplasm of unspecified part of right bronchus or lung: Secondary | ICD-10-CM

## 2022-12-15 DIAGNOSIS — Z5112 Encounter for antineoplastic immunotherapy: Secondary | ICD-10-CM | POA: Diagnosis not present

## 2022-12-15 DIAGNOSIS — Z95828 Presence of other vascular implants and grafts: Secondary | ICD-10-CM

## 2022-12-15 MED ORDER — SODIUM CHLORIDE 0.9% FLUSH
10.0000 mL | Freq: Once | INTRAVENOUS | Status: AC
  Administered 2022-12-15: 10 mL

## 2022-12-15 MED ORDER — HEPARIN SOD (PORK) LOCK FLUSH 100 UNIT/ML IV SOLN
500.0000 [IU] | Freq: Once | INTRAVENOUS | Status: AC
  Administered 2022-12-15: 500 [IU] via INTRAVENOUS

## 2022-12-15 NOTE — Progress Notes (Signed)
Has armband been applied?  Yes.    Does patient have an allergy to IV contrast dye?: No.   Has patient ever received premedication for IV contrast dye?: No.   Date of lab work: 12/08/2022 at 0844 BUN: 9 CR: 0.81 eGFR: >0.81  IV site: Port  Has IV site been added to flowsheet?  Yes.    BP (!) 100/58 (BP Location: Left Arm, Patient Position: Sitting)   Pulse 99   Temp 97.8 F (36.6 C) (Temporal)   Resp 18   Ht 5\' 2"  (1.575 m)   SpO2 98%   BMI 19.20 kg/m

## 2022-12-20 DIAGNOSIS — Z5112 Encounter for antineoplastic immunotherapy: Secondary | ICD-10-CM | POA: Diagnosis not present

## 2022-12-22 ENCOUNTER — Telehealth: Payer: Self-pay | Admitting: Medical Oncology

## 2022-12-22 IMAGING — CT CT ANGIO CHEST
2 of 7 series · 16 of 46 positions shown · IV contrast (agent unspecified)
Comparison: PET-CT 12/12/2020.  CTA chest 11/27/2020.

CLINICAL DATA: Shortness of breath and chest pain. Clinical concern
for pulmonary embolus. History of lung cancer with abdominal pain.
Elevated bilirubin and diarrhea.



[Series 3: thins · axial · 0.58mm/px · z∈[+1451,+1681]mm · 13 of 262 slices shown]
[im 16/262  lung]
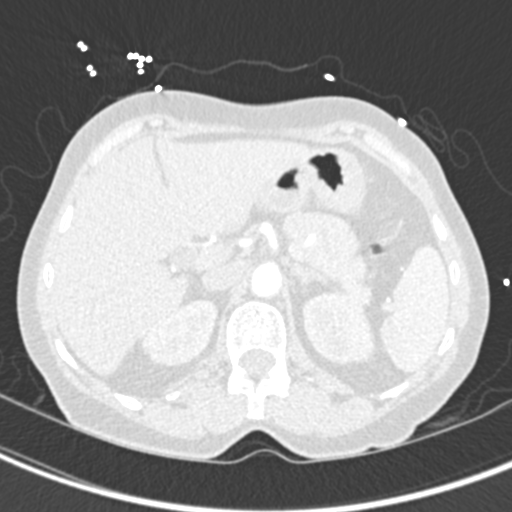
[im 31/262  soft-tissue]
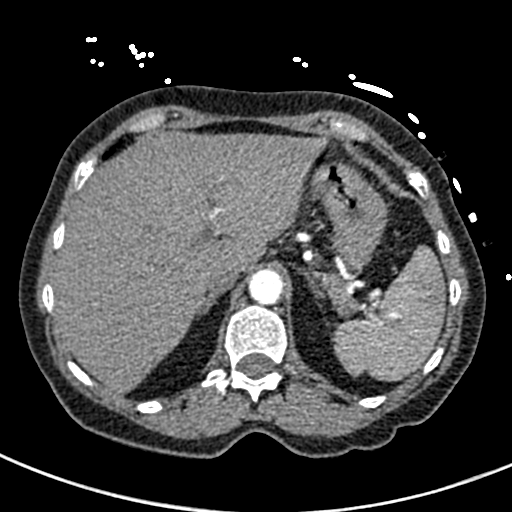
[im 62/262  lung]
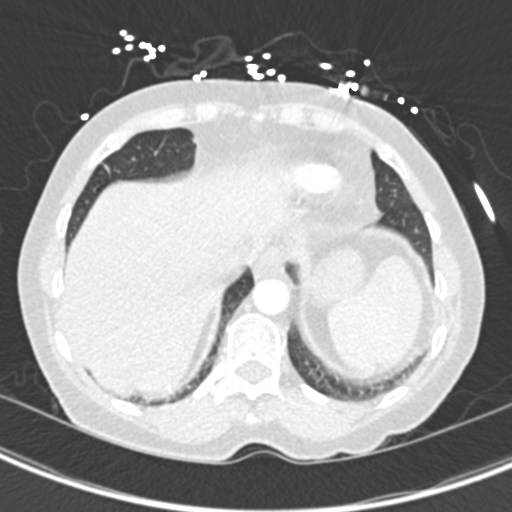
[im 77/262  soft-tissue]
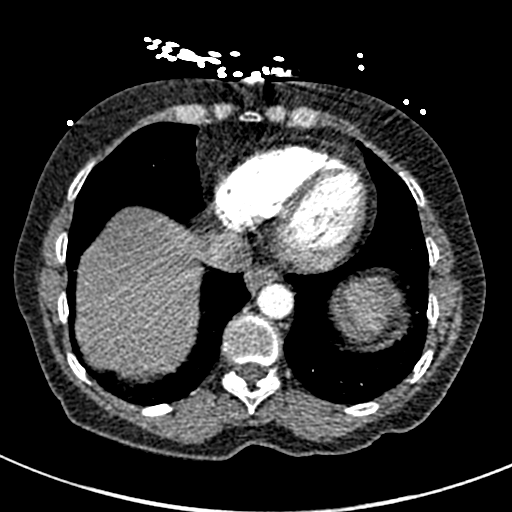
[im 93/262  lung]
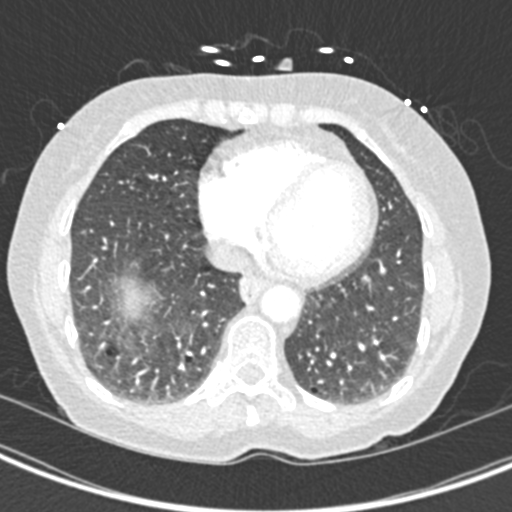
[im 108/262  soft-tissue]
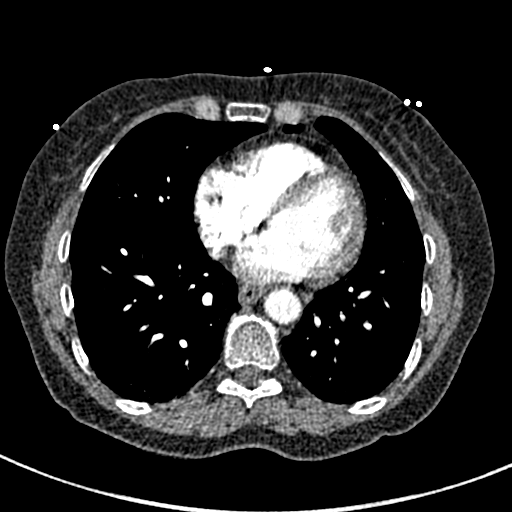
[im 139/262  lung]
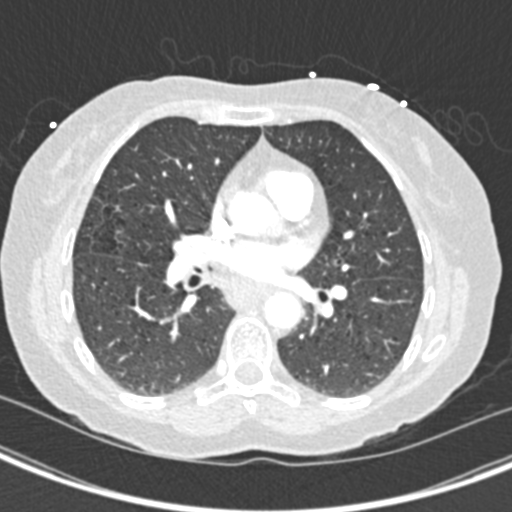
[im 154/262  soft-tissue]
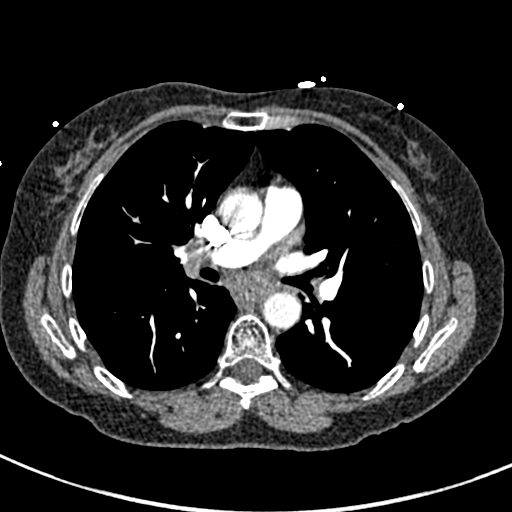
[im 169/262  lung]
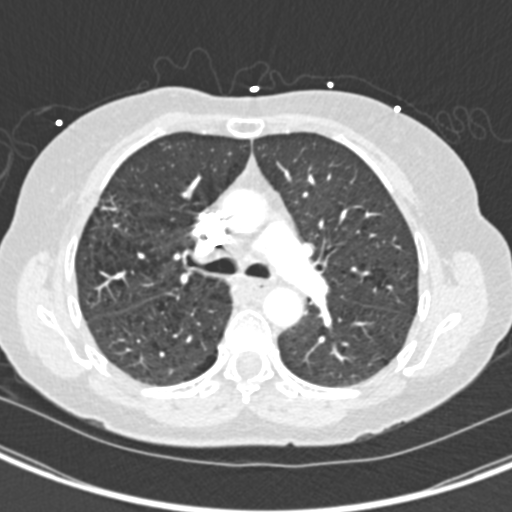
[im 185/262  soft-tissue]
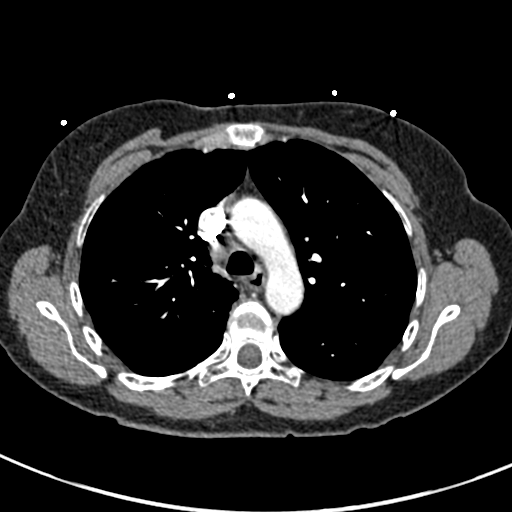
[im 200/262  lung]
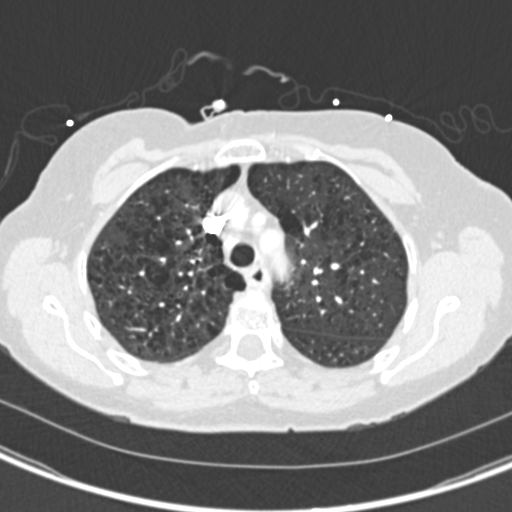
[im 231/262  soft-tissue]
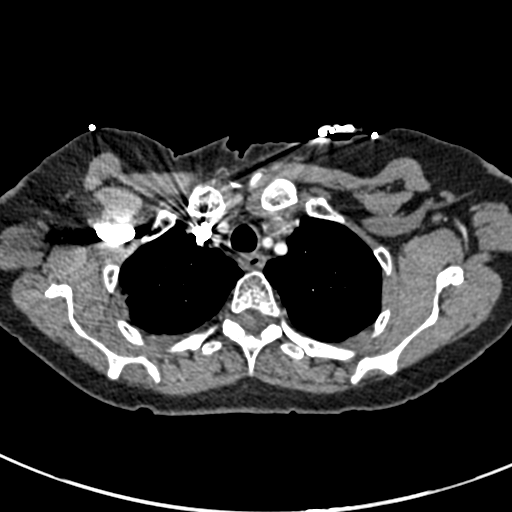
[im 246/262  lung]
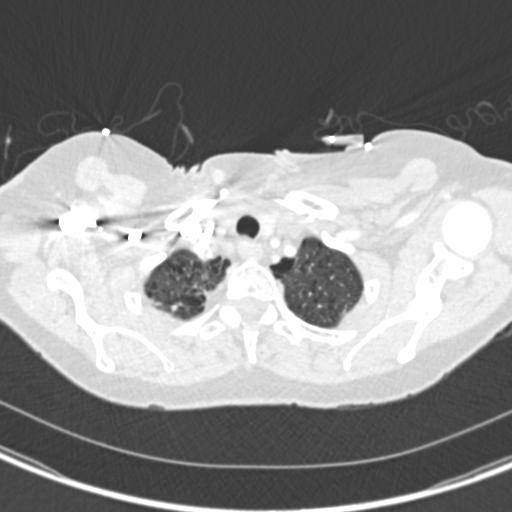

[Series 4: coronal mpr · coronal · 0.54mm/px · 3 of 117 slices shown]
[im 30/117  soft-tissue]
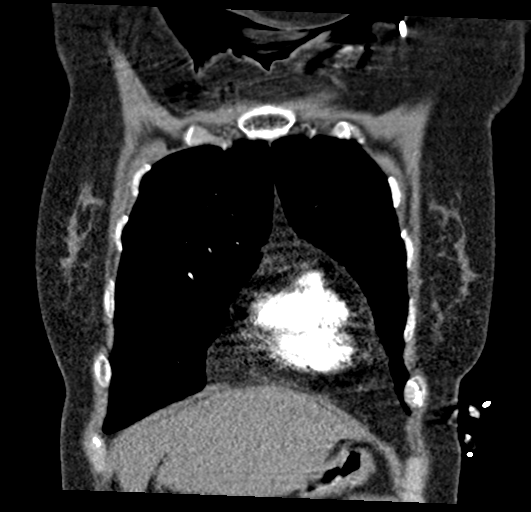
[im 59/117  soft-tissue]
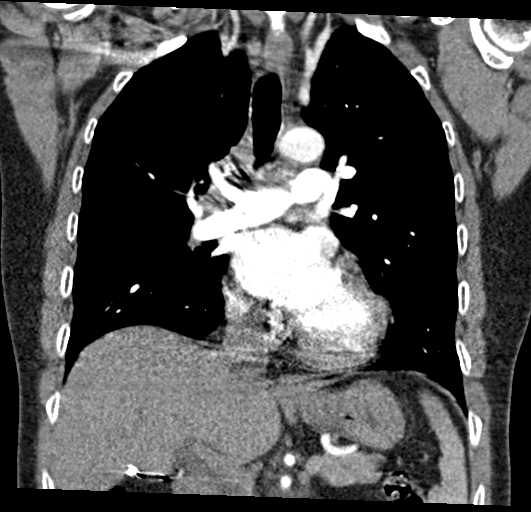
[im 88/117  soft-tissue]
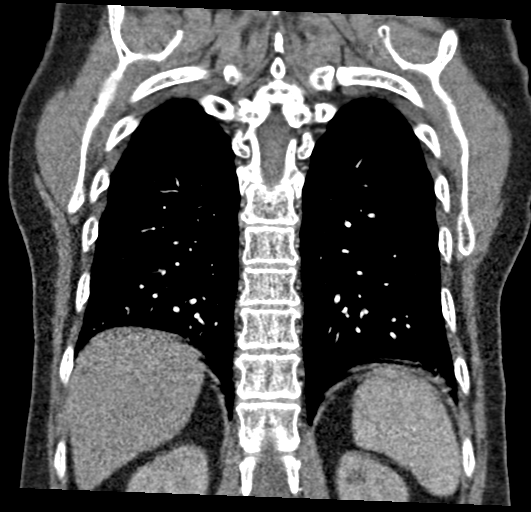

[16 of 46 positions shown; findings below may reference images not displayed]

RADIATION DOSE REDUCTION: This exam was performed according to the
departmental dose-optimization program which includes automated
exposure control, adjustment of the mA and/or kV according to
patient size and/or use of iterative reconstruction technique.

CONTRAST:  75mL OMNIPAQUE IOHEXOL 350 MG/ML SOLN
FINDINGS: CTA CHEST FINDINGS

Cardiovascular: The heart size is normal. No substantial pericardial
effusion. No thoracic aortic aneurysm. There is no filling defect
within the opacified pulmonary arteries to suggest the presence of
an acute pulmonary embolus.

Mediastinum/Nodes: Bulky right hilar lymphadenopathy seen on the
previous study has decreased with right hilar lymph node measuring
14 mm short axis today compared to 24 mm previously. Subcarinal
lymphadenopathy has also decreased measuring approximate 11 mm today
compared to 17 mm previously. No left hilar lymphadenopathy.
Possible mild wall thickening in the mid esophagus (image 38/2).
There is no axillary lymphadenopathy.

Lungs/Pleura: Centrilobular and paraseptal emphysema evident. Right
upper lobe pulmonary nodule has decreased in the interval measuring
9 x 7 mm today on 52/7, decreased from 12 x 11 mm (remeasured)
previously. No new suspicious nodule or mass. No focal airspace
consolidation. No pleural effusion.

Musculoskeletal: No worrisome lytic or sclerotic osseous
abnormality.

Review of the MIP images confirms the above findings.

CT ABDOMEN and PELVIS FINDINGS

Hepatobiliary: No suspicious focal abnormality within the liver
parenchyma. Gallbladder surgically absent. Common bile duct measures
8 mm diameter in the head of the pancreas increased from 5 mm
previously.

Pancreas: Prominence of the main pancreatic duct in the head of
pancreas is similar to prior with main pancreatic duct measuring up
to 4 mm diameter.

Spleen: No splenomegaly. No focal mass lesion.

Adrenals/Urinary Tract: No adrenal nodule or mass. 10 mm subcapsular
lesion interpolar right kidney has attenuation too high to be a
simple cyst. 13 mm hypoattenuating lesion upper interpolar left
kidney is likely a cyst. No evidence for hydroureter. The urinary
bladder appears normal for the degree of distention.

Stomach/Bowel: Stomach is unremarkable. No gastric wall thickening.
No evidence of outlet obstruction. Duodenum is normally positioned
as is the ligament of Treitz. No small bowel wall thickening. No
small bowel dilatation. The terminal ileum is normal. The appendix
is not well visualized, but there is no edema or inflammation in the
region of the cecum. No gross colonic mass. No colonic wall
thickening. Diverticular changes are noted in the left colon without
evidence of diverticulitis.

Vascular/Lymphatic: There is mild atherosclerotic calcification of
the abdominal aorta without aneurysm. There is no gastrohepatic or
hepatoduodenal ligament lymphadenopathy. No retroperitoneal or
mesenteric lymphadenopathy. No pelvic sidewall lymphadenopathy.

Reproductive: Unremarkable.

Other: No intraperitoneal free fluid.

Musculoskeletal: No worrisome lytic or sclerotic osseous
abnormality.

Review of the MIP images confirms the above findings.
IMPRESSION: 1. No evidence for acute pulmonary embolus.
2. Interval decrease in size of the right upper lobe pulmonary
nodule and bulky right hilar and subcarinal lymphadenopathy.
3. No evidence for metastatic disease in the abdomen or pelvis.
4. 10 mm subcapsular lesion interpolar right kidney has attenuation
too high to be a simple cyst. While this may represent a cyst
complicated by proteinaceous debris or hemorrhage, neoplasm could
have a similar appearance. MRI of the abdomen with and without
contrast recommended to further evaluate.
5. Prominence of the main pancreatic duct in the head of pancreas is
similar to prior but slightly progressive. While likely related to
prior surgery, given the reported history of elevated bilirubin,
MRCP or ERCP may be warranted to further evaluate.
6. Possible mild wall thickening in the mid esophagus. Esophagitis
could have this appearance.
7. Aortic Atherosclerosis (X0AXG-KOC.C) and Emphysema (X0AXG-2DY.U).

## 2022-12-22 IMAGING — DX DG CHEST 1V PORT
1 series · 1 of 1 positions shown · non-contrast
Comparison: Chest x-ray 12/15/2020

CLINICAL DATA: Shortness of breath

EXAM:
PORTABLE CHEST 1 VIEW

[chest ap]
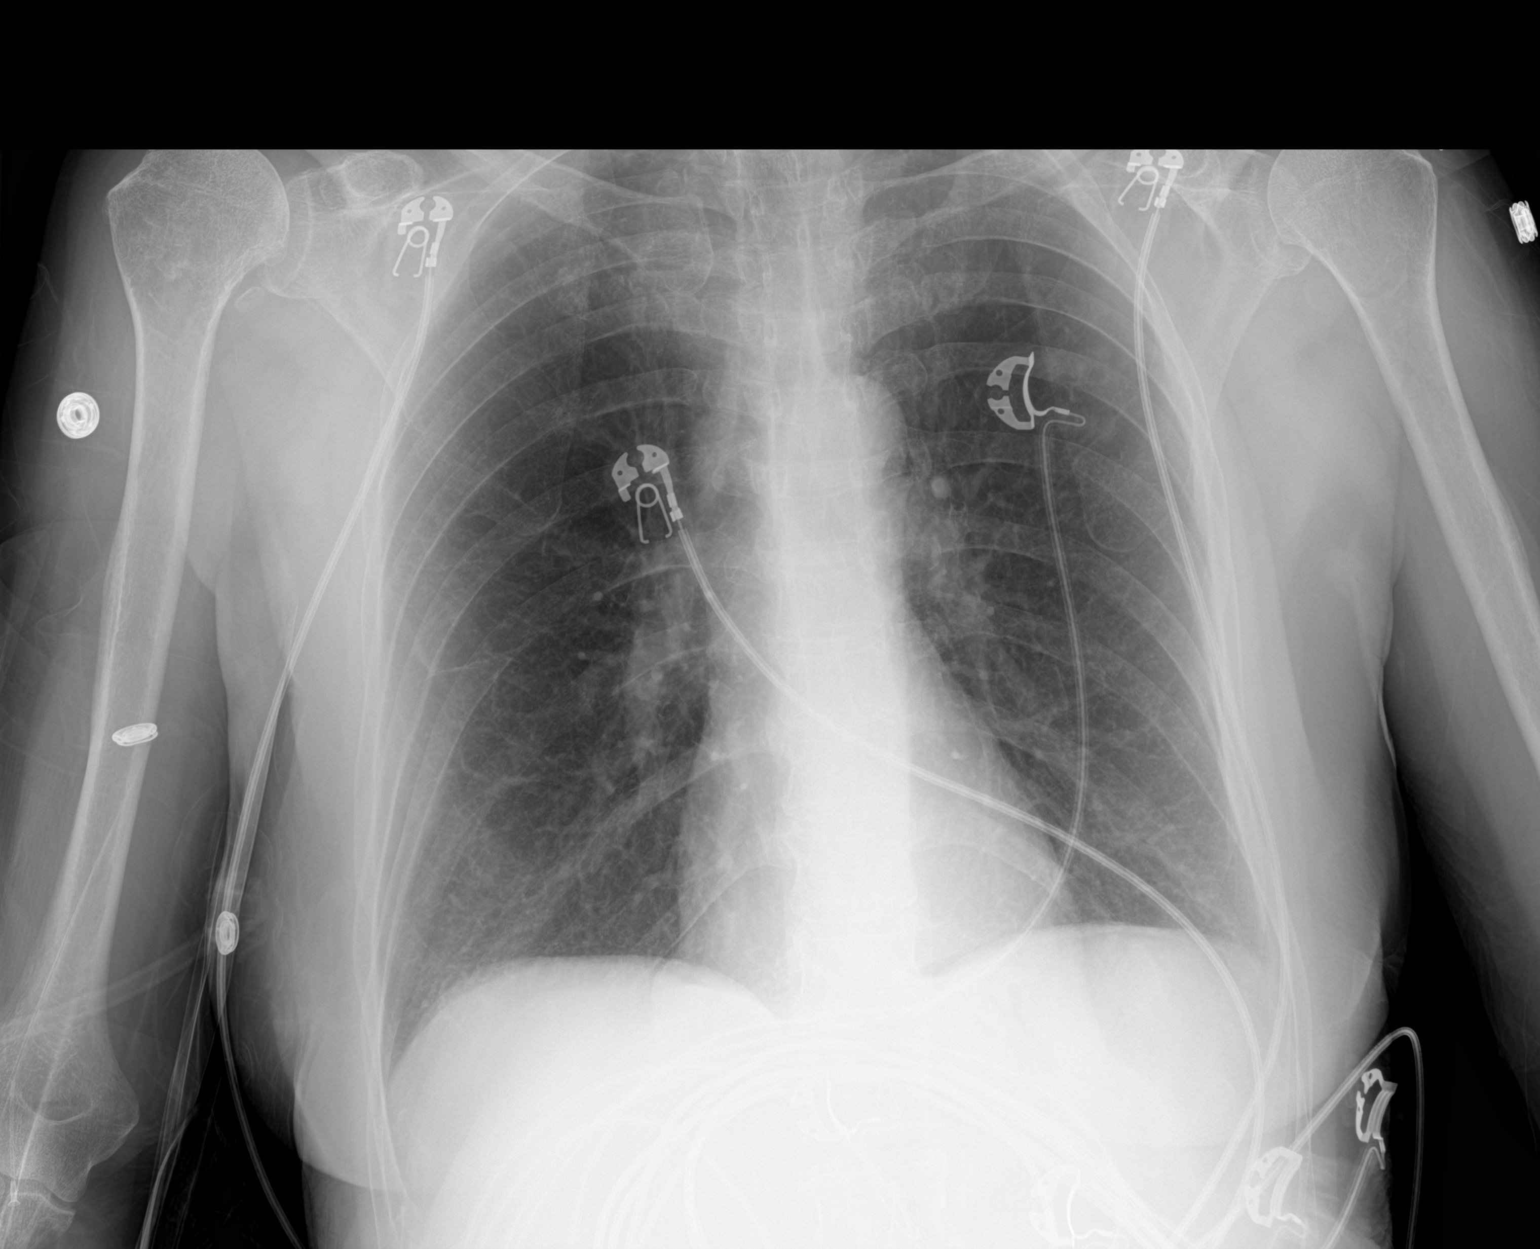

[1 of 1 positions shown; findings below may reference images not displayed]

FINDINGS: Heart size and mediastinal contours are within normal limits. No
suspicious pulmonary opacities identified.

No pleural effusion or pneumothorax visualized.

No acute osseous abnormality appreciated.
IMPRESSION: No acute intrathoracic process identified.

## 2022-12-22 IMAGING — CT CT ABD-PELV W/ CM
2 of 6 series · 14 of 46 positions shown, 16 images · IV contrast (agent unspecified)
Comparison: PET-CT 12/12/2020.  CTA chest 11/27/2020.

CLINICAL DATA: Shortness of breath and chest pain. Clinical concern
for pulmonary embolus. History of lung cancer with abdominal pain.
Elevated bilirubin and diarrhea.



[Series 6: coronal st · coronal · 0.58mm/px · 3 of 87 slices shown]
[im 29/87  soft-tissue]
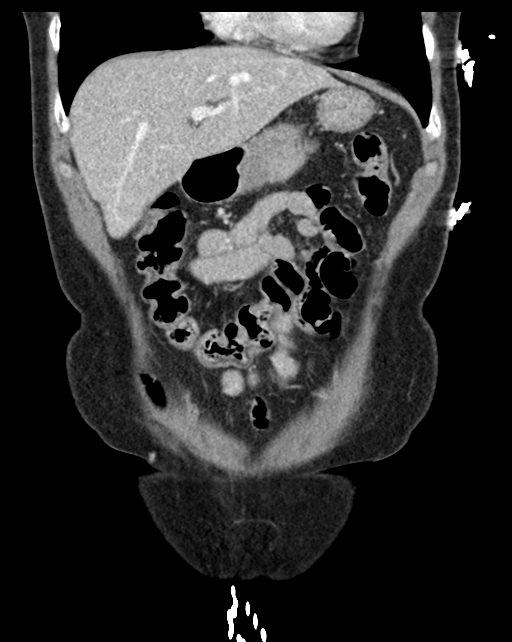
[im 39/87  soft-tissue]
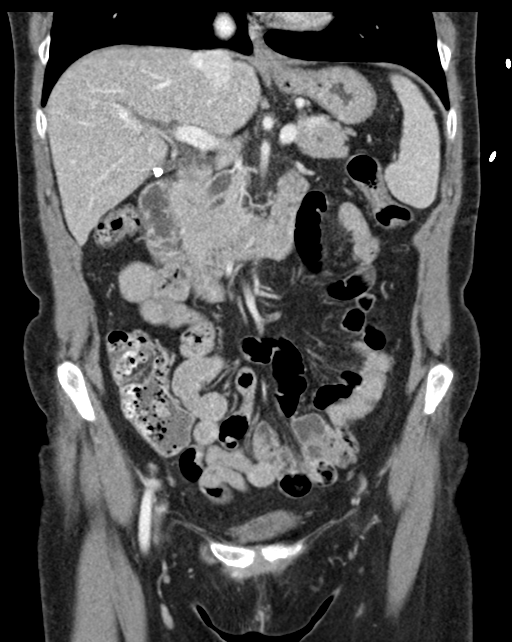
[im 48/87  soft-tissue]
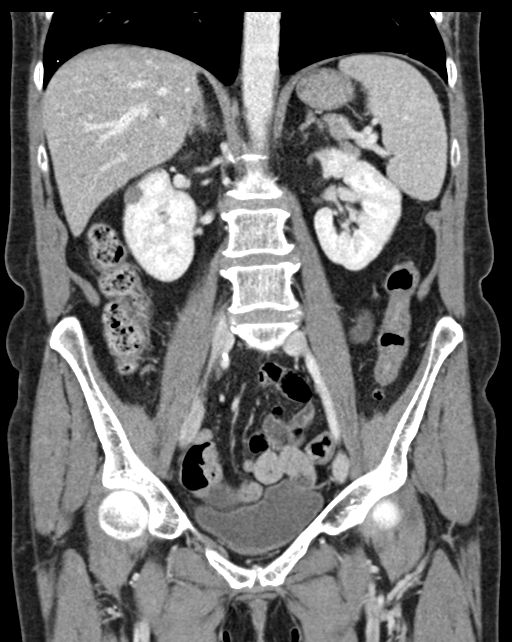

[Series 10: axial st · axial · 0.64mm/px · z∈[+1177,+1477]mm · 11 of 74 slices shown, 13 images]
[im 7/74  soft-tissue]
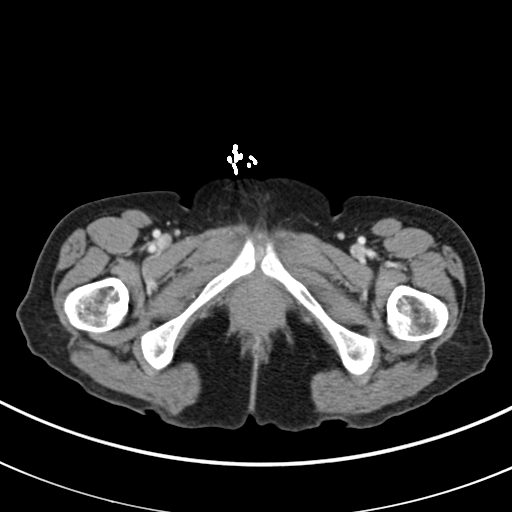
[im 7/74  bone]
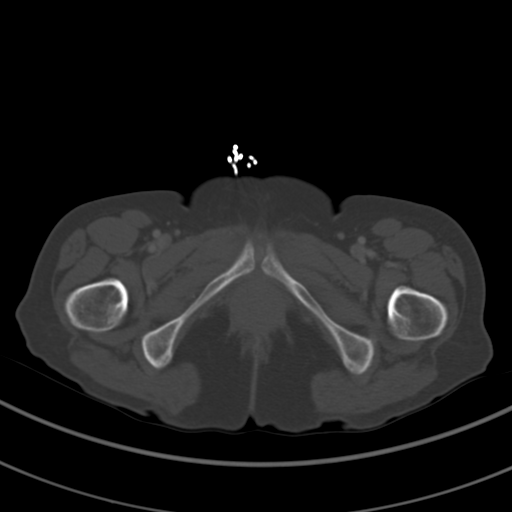
[im 13/74  soft-tissue]
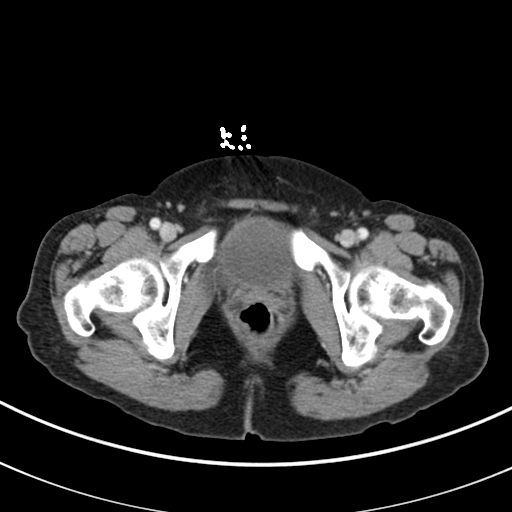
[im 19/74  soft-tissue]
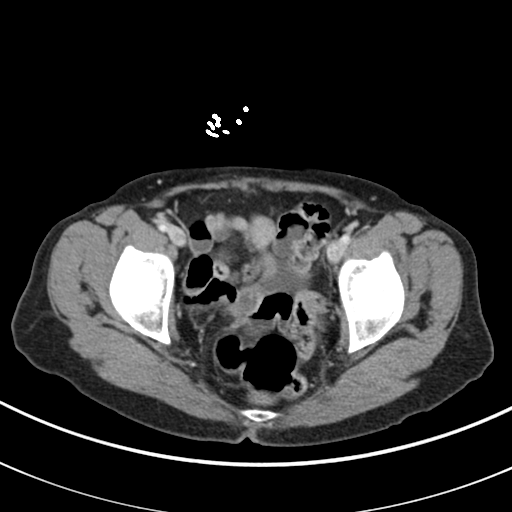
[im 25/74  soft-tissue]
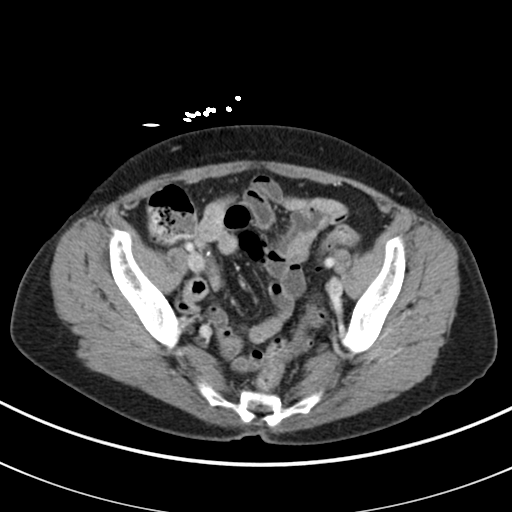
[im 31/74  soft-tissue]
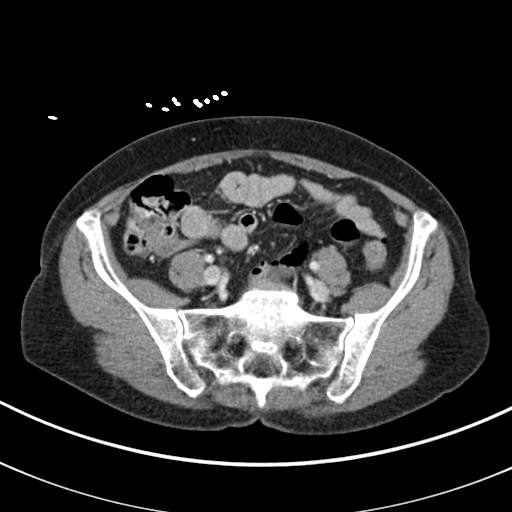
[im 37/74  soft-tissue]
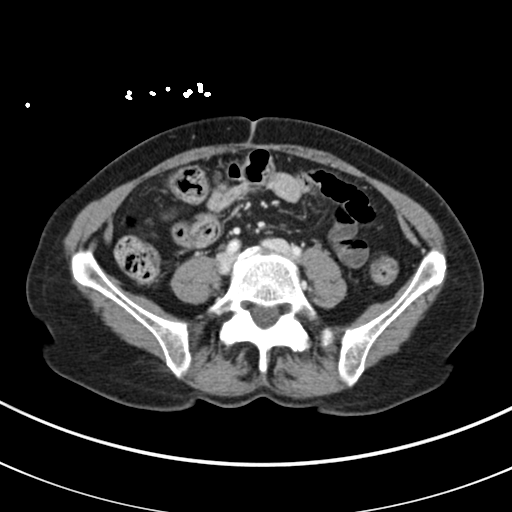
[im 43/74  soft-tissue]
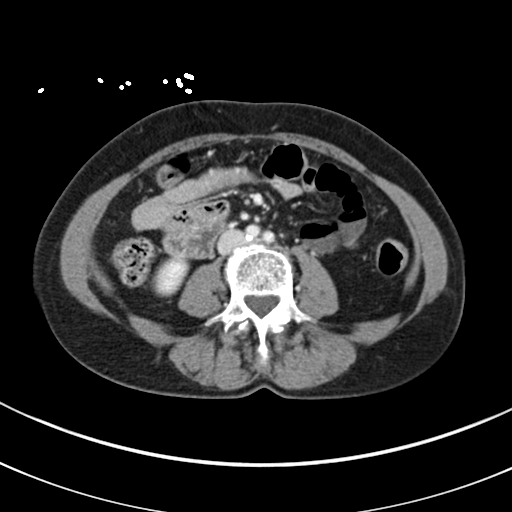
[im 49/74  soft-tissue]
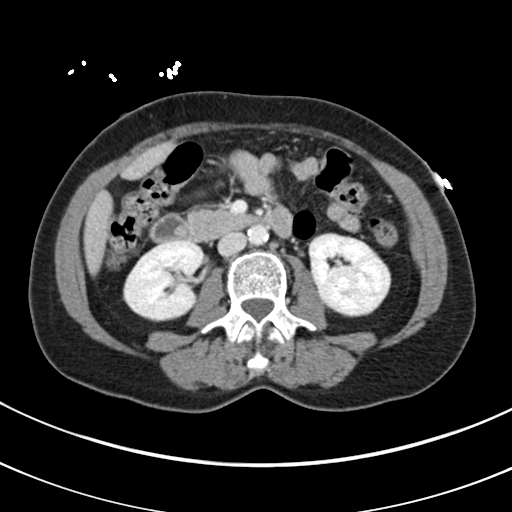
[im 55/74  soft-tissue]
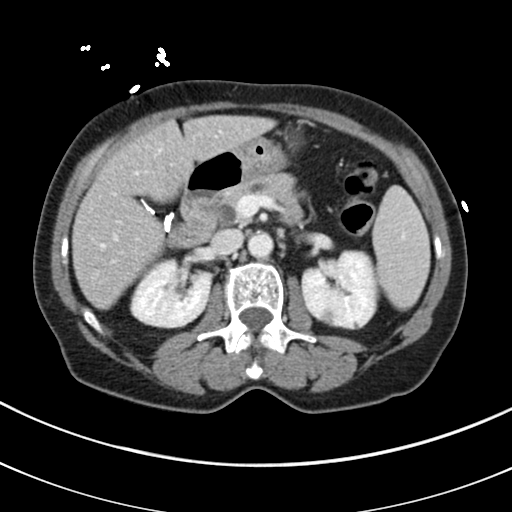
[im 55/74  bone]
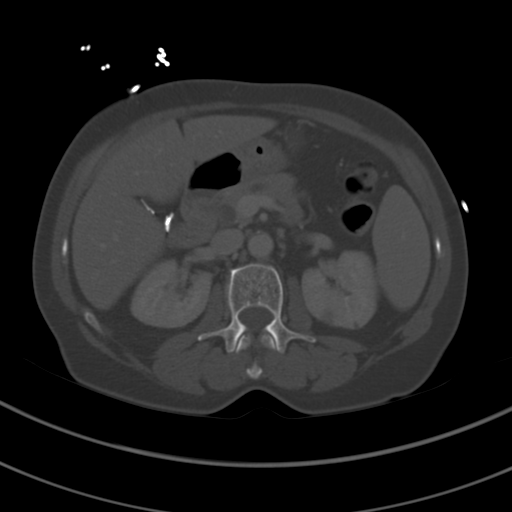
[im 61/74  soft-tissue]
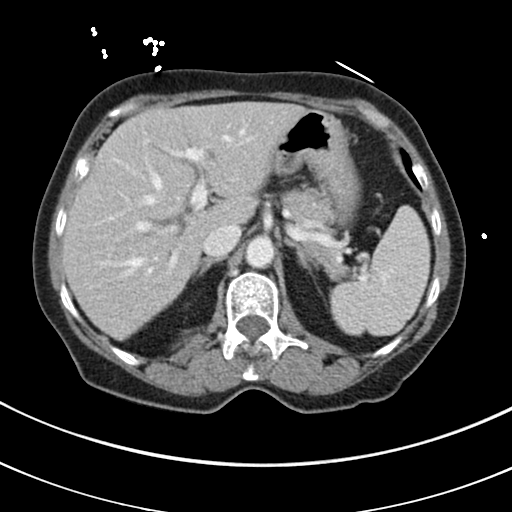
[im 67/74  soft-tissue]
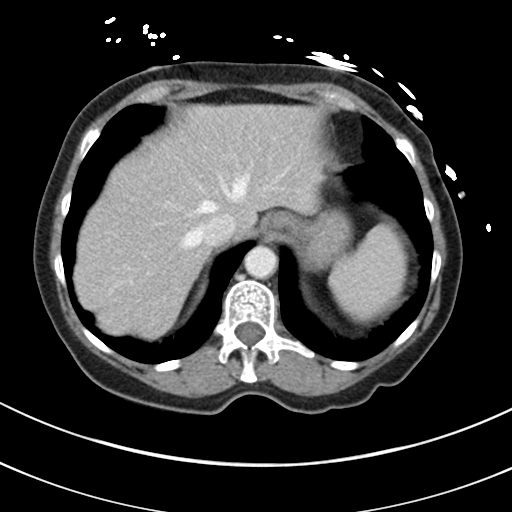

[14 of 46 positions shown; findings below may reference images not displayed]

RADIATION DOSE REDUCTION: This exam was performed according to the
departmental dose-optimization program which includes automated
exposure control, adjustment of the mA and/or kV according to
patient size and/or use of iterative reconstruction technique.

CONTRAST:  75mL OMNIPAQUE IOHEXOL 350 MG/ML SOLN
FINDINGS: CTA CHEST FINDINGS

Cardiovascular: The heart size is normal. No substantial pericardial
effusion. No thoracic aortic aneurysm. There is no filling defect
within the opacified pulmonary arteries to suggest the presence of
an acute pulmonary embolus.

Mediastinum/Nodes: Bulky right hilar lymphadenopathy seen on the
previous study has decreased with right hilar lymph node measuring
14 mm short axis today compared to 24 mm previously. Subcarinal
lymphadenopathy has also decreased measuring approximate 11 mm today
compared to 17 mm previously. No left hilar lymphadenopathy.
Possible mild wall thickening in the mid esophagus (image 38/2).
There is no axillary lymphadenopathy.

Lungs/Pleura: Centrilobular and paraseptal emphysema evident. Right
upper lobe pulmonary nodule has decreased in the interval measuring
9 x 7 mm today on 52/7, decreased from 12 x 11 mm (remeasured)
previously. No new suspicious nodule or mass. No focal airspace
consolidation. No pleural effusion.

Musculoskeletal: No worrisome lytic or sclerotic osseous
abnormality.

Review of the MIP images confirms the above findings.

CT ABDOMEN and PELVIS FINDINGS

Hepatobiliary: No suspicious focal abnormality within the liver
parenchyma. Gallbladder surgically absent. Common bile duct measures
8 mm diameter in the head of the pancreas increased from 5 mm
previously.

Pancreas: Prominence of the main pancreatic duct in the head of
pancreas is similar to prior with main pancreatic duct measuring up
to 4 mm diameter.

Spleen: No splenomegaly. No focal mass lesion.

Adrenals/Urinary Tract: No adrenal nodule or mass. 10 mm subcapsular
lesion interpolar right kidney has attenuation too high to be a
simple cyst. 13 mm hypoattenuating lesion upper interpolar left
kidney is likely a cyst. No evidence for hydroureter. The urinary
bladder appears normal for the degree of distention.

Stomach/Bowel: Stomach is unremarkable. No gastric wall thickening.
No evidence of outlet obstruction. Duodenum is normally positioned
as is the ligament of Treitz. No small bowel wall thickening. No
small bowel dilatation. The terminal ileum is normal. The appendix
is not well visualized, but there is no edema or inflammation in the
region of the cecum. No gross colonic mass. No colonic wall
thickening. Diverticular changes are noted in the left colon without
evidence of diverticulitis.

Vascular/Lymphatic: There is mild atherosclerotic calcification of
the abdominal aorta without aneurysm. There is no gastrohepatic or
hepatoduodenal ligament lymphadenopathy. No retroperitoneal or
mesenteric lymphadenopathy. No pelvic sidewall lymphadenopathy.

Reproductive: Unremarkable.

Other: No intraperitoneal free fluid.

Musculoskeletal: No worrisome lytic or sclerotic osseous
abnormality.

Review of the MIP images confirms the above findings.
IMPRESSION: 1. No evidence for acute pulmonary embolus.
2. Interval decrease in size of the right upper lobe pulmonary
nodule and bulky right hilar and subcarinal lymphadenopathy.
3. No evidence for metastatic disease in the abdomen or pelvis.
4. 10 mm subcapsular lesion interpolar right kidney has attenuation
too high to be a simple cyst. While this may represent a cyst
complicated by proteinaceous debris or hemorrhage, neoplasm could
have a similar appearance. MRI of the abdomen with and without
contrast recommended to further evaluate.
5. Prominence of the main pancreatic duct in the head of pancreas is
similar to prior but slightly progressive. While likely related to
prior surgery, given the reported history of elevated bilirubin,
MRCP or ERCP may be warranted to further evaluate.
6. Possible mild wall thickening in the mid esophagus. Esophagitis
could have this appearance.
7. Aortic Atherosclerosis (X0AXG-KOC.C) and Emphysema ([id number].U).

## 2022-12-22 NOTE — Telephone Encounter (Signed)
FYI-Swelling again @ left neck " Lymph node". Radiation port and treat tomorrow. Denies airway compromise.

## 2022-12-22 NOTE — Progress Notes (Signed)
Julie [facility]olson OFFICE PROGRESS NOTE  [name], [name], NP [address] [place] [address]  DIAGNOSIS: Recurrent/metastatic non-small cell lung cancer initially diagnosed as stage IIIB  (T1b, N3, M0) non-small cell lung cancer, adenocarcinoma she presented with right upper lobe nodule in addition to bulky right hilar, mediastinal, and left supraclavicular lymphadenopathy diagnosed in November 2022.  The patient had evidence for disease recurrence in the mediastinal and right supraclavicular lymphadenopathy in August 2023.  The patient had evidence of metastatic disease in February 2024 with several supraclavicular, thoracic and precarinal lymphadenopathy as well as upper abdominal lymph nodes and small liver lesion.   DETECTED ALTERATION(S) / BIOMARKER(S)% CFDNA OR AMPLIFICATIONASSOCIATED FDA-APPROVED THERAPIESCLINICAL TRIAL AVAILABILITY [id number] ND 0.5 5 50 100 4.7%   RHOAG17E ND 0.5 5 50 100 1.8%   CTNNB1S37C ND 0.5 5 50 100 1.9%   BIOMARKERADDITIONAL DETAILS Tumor Mutational Burden (TMB) 19.02 mut/Mb MSI Status Stable (MSS) PD-L1 Tumor Proportion Score (TPS)* <1%   Biomarker Findings By [facility] on 05/06/2022 Tumor Mutational Burden - 17 Muts/Mb Microsatellite status - MS-Stable Genomic Findings For a complete list of the genes assayed, please refer to the Appendix. CTNNB1 S37C CRKL amplification MAP2K4 loss exons 2-11 TP53 V143A 8 Disease relevant genes with no reportable alterations: ALK, BRAF, EGFR, ERBB2, KRAS, MET, RET, ROS1   PDL1 0%  PRIOR THERAPY:  1) Concurrent chemoradiation with carboplatin for an AUC of 2 and paclitaxel 45 mg per metered squared.  First dose on 01/05/2021.  Status post 7 cycles of treatment.  Last dose was given February 16, 2021. 2) Consolidation immunotherapy with Imfinzi 1500 Mg IV every 4 weeks.  First dose March 25, 2021.  Status post 3 cycles.  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 3) Palliative radiotherapy to the enlarging right supraclavicular lymphadenopathy under the care of Dr. Roselind Messier expected to be completed on October 28, 2021.  CURRENT THERAPY: 1) Palliative systemic chemotherapy with carboplatin for AUC of 5, Alimta 500 Mg/M2 and Keytruda 200 Mg IV every 3 weeks.  First dose April 05, 2022.  Status post 11 cycles.  Starting from cycle #3 her dose of carboplatin was reduced to AUC of 4 and Alimta 400 Mg/M2.  Starting from cycle #5 the patient will be on maintenance treatment with Alimta and Keytruda every 3 weeks.  2) Radiation to the enlarging left supraclavicular lymph node under the care of Dr. Roselind Messier. Last dose expected on 01/07/23  INTERVAL HISTORY: Julie Nicholson 57 y.o. female returns to the clinic today for a follow up visit. The patient was last seen 3 weeks ago by Dr. Arbutus Ped and myself. She is currently undergoing chemotherapy and immunotherapy for her lung cancer. She tolerates it well. She had a restaging CT which showed enlarging left supraclavicular lymph node. She is seeing Dr. Roselind Messier who is arranging radiation to this area. The last day of radiation is tentatively scheduled for 01/07/23. She does feel like this area is getting smaller since having radiation.  She does not have any skin burns at this time and she is applying lotion on this area.  She denies fever or chills. She gets night sweats on and off at baseline. Denies any hemoptysis. Her breathing is "pretty good".  She occasionally may struggle with some intermittent chest tightness.  She mostly gets a cough occasionally in the morning.  She started taking antihistamines and Mucinex at night which does help produce less secretions.  Overall her cough is stable.  She  denies any hemoptysis or chest pain.  She denies any significant nausea except she may occasionally get nausea when she takes her other prescribed medications.  Denies any vomiting, diarrhea, or constipation.  She  denies any unusual headaches.  She has not had any recent migraines.  If she ever does have a mild headache she takes Tylenol which does control her headache.  She denies any rashes or skin changes.  She is here today for evaluation repeat blood work before undergoing cycle #13.  MEDICAL HISTORY: Past Medical History:  Diagnosis Date   Abdominal discomfort    Cancer (HCC)    Chronic headaches    due to allergies, sinus   COPD (chronic obstructive pulmonary disease) (HCC)    per 2012 chest xray   pt states she doesn not have this now (04/10/2013)   Deviated nasal septum    Eustachian tube dysfunction    GERD (gastroesophageal reflux disease)    occasional uses Tums / Rolaids   Hearing loss    right ear   High cholesterol    History of radiation therapy    right lung 01/07/2021-02/19/2021  Dr Antony Blackbird   Migraine    "only once in a blue moon since RX'd allergy shots" (04/10/2013)   Pancreatitis 02/08/2013   Pneumonia    Rhinitis, allergic     ALLERGIES:  is allergic to carboplatin.  MEDICATIONS:  Current Outpatient Medications  Medication Sig Dispense Refill   acetaminophen (TYLENOL) 500 MG tablet Take 1,000 mg by mouth every 6 (six) hours as needed for moderate pain.     albuterol (PROVENTIL) (2.5 MG/3ML) 0.083% nebulizer solution albuterol sulfate 2.5 mg/3 mL (0.083 %) solution for nebulization  USE 1 VIAL IN NEBULIZER EVERY 6 HOURS AS NEEDED FOR WHEEZING OR SHORTNESS OF BREATH     albuterol (VENTOLIN HFA) 108 (90 Base) MCG/ACT inhaler Inhale 1-2 puffs into the lungs every 6 (six) hours as needed for wheezing or shortness of breath. 18 g 3   aspirin EC 81 MG tablet Take 81 mg by mouth at bedtime.     atorvastatin (LIPITOR) 40 MG tablet Take 40 mg by mouth at bedtime.     azelastine (ASTELIN) 0.1 % nasal spray Place 1 spray into both nostrils 2 (two) times daily as needed. Use in each nostril as directed 30 mL 5   benzonatate (TESSALON) 200 MG capsule Take 1 capsule (200 mg  total) by mouth 3 (three) times daily as needed. 30 capsule 0   Biotin 5000 MCG TABS Take 5,000 mcg by mouth at bedtime.     buPROPion (WELLBUTRIN XL) 150 MG 24 hr tablet Take 150 mg by mouth every morning.     Ca Carbonate-Mag Hydroxide (ROLAIDS PO) Take 1 tablet by mouth daily as needed (heartburn).     cetirizine (ZYRTEC ALLERGY) 10 MG tablet Take 1 tablet (10 mg total) by mouth daily as needed for rhinitis. 30 tablet 5   famotidine (PEPCID) 20 MG tablet Take 1 tablet by mouth twice daily 60 tablet 0   fluticasone (FLONASE) 50 MCG/ACT nasal spray Place 2 sprays into both nostrils daily. 16 g 5   folic acid (FOLVITE) 1 MG tablet Take 1 tablet by mouth daily 30 tablet 0   hydrocortisone 1 % ointment Apply 1 Application topically 2 (two) times daily. 30 g 0   lidocaine-prilocaine (EMLA) cream APPLY TO PORT-A-CATH 30 TO 60 MINUTES BEFORE TREATMENT 30 g 0   Multiple Vitamins-Minerals (HAIR SKIN AND NAILS FORMULA PO) Take 1  tablet by mouth daily.     ondansetron (ZOFRAN-ODT) 8 MG disintegrating tablet Take 1 tablet (8 mg total) by mouth 3 (three) times daily as needed. 20 tablet 1   Oxycodone HCl 10 MG TABS Take 1 tablet by mouth 3 (three) times daily as needed (pain).     PARoxetine (PAXIL) 10 MG tablet Take 10 mg by mouth daily.     rizatriptan (MAXALT-MLT) 10 MG disintegrating tablet Take 10 mg by mouth as needed for migraine. May repeat in 2 hours if needed     Tiotropium Bromide-Olodaterol (STIOLTO RESPIMAT) 2.5-2.5 MCG/ACT AERS Inhale 2 puffs into the lungs daily. 12 g 3   doxycycline (VIBRAMYCIN) 100 MG capsule Take 100 mg by mouth 2 (two) times daily.     gabapentin (NEURONTIN) 300 MG capsule Take 300 mg by mouth.     linaCLOtide (LINZESS PO) Take by mouth.     prochlorperazine (COMPAZINE) 10 MG tablet Take 1 tablet (10 mg total) by mouth every 6 (six) hours as needed. 30 tablet 2   sertraline (ZOLOFT) 50 MG tablet Take 1 tablet by mouth daily.     No current facility-administered  medications for this visit.   Facility-Administered Medications Ordered in Other Visits  Medication Dose Route Frequency Provider Last Rate Last Admin   0.9 %  sodium chloride infusion   Intravenous Once Si Gaul, MD       heparin lock flush 100 unit/mL  500 Units Intracatheter Once PRN Si Gaul, MD       pembrolizumab [facility]) 200 mg in sodium chloride 0.9 % 50 mL chemo infusion  200 mg Intravenous Once Si Gaul, MD       PEMEtrexed (ALIMTA) 600 mg in sodium chloride 0.9 % 100 mL chemo infusion  400 mg/m2 (Treatment Plan Recorded) Intravenous Once Si Gaul, MD       prochlorperazine (COMPAZINE) tablet 10 mg  10 mg Oral Once Si Gaul, MD       sodium chloride flush (NS) 0.9 % injection 10 mL  10 mL Intracatheter PRN Si Gaul, MD        SURGICAL HISTORY:  Past Surgical History:  Procedure Laterality Date   ABDOMINAL HYSTERECTOMY  1995   tx endometriosis, both ovaries removed   ADENOIDECTOMY     APPENDECTOMY  late 1990's   BIOPSY  02/26/2021   Procedure: BIOPSY;  Surgeon: Rachael Fee, MD;  Location: [facility];  Service: Endoscopy;;   BRONCHIAL BRUSHINGS  12/15/2020   Procedure: BRONCHIAL BRUSHINGS;  Surgeon: Leslye Peer, MD;  Location: [facility] ENDOSCOPY;  Service: Cardiopulmonary;;   BRONCHIAL NEEDLE ASPIRATION BIOPSY  12/15/2020   Procedure: BRONCHIAL NEEDLE ASPIRATION BIOPSIES;  Surgeon: Leslye Peer, MD;  Location: [facility];  Service: Cardiopulmonary;;   CHOLECYSTECTOMY  04/10/2013   CHOLECYSTECTOMY N/A 04/10/2013   Procedure: LAPAROSCOPIC CHOLECYSTECTOMY WITH INTRAOPERATIVE CHOLANGIOGRAM;  Surgeon: Adolph Pollack, MD;  Location: [facility] OR;  Service: General;  Laterality: N/A;   ELECTROMAGNETIC NAVIGATION BROCHOSCOPY  12/15/2020   Procedure: ELECTROMAGNETIC NAVIGATION BRONCHOSCOPY;  Surgeon: Leslye Peer, MD;  Location: [facility];  Service: Cardiopulmonary;;   ESOPHAGOGASTRODUODENOSCOPY (EGD) WITH PROPOFOL N/A  02/26/2021   Procedure: ESOPHAGOGASTRODUODENOSCOPY (EGD) WITH PROPOFOL;  Surgeon: Rachael Fee, MD;  Location: [facility];  Service: Endoscopy;  Laterality: N/A;   EUS N/A 02/16/2013   Procedure: UPPER ENDOSCOPIC ULTRASOUND (EUS) LINEAR;  Surgeon: Rachael Fee, MD;  Location: [facility];  Service: Endoscopy;  Laterality: N/A;  radial linear   IR IMAGING GUIDED PORT  INSERTION  06/30/2022   KNEE ARTHROSCOPY Right 1980's   "cartilage OR"   LAPAROSCOPIC ENDOMETRIOSIS FULGURATION  1980's   MYRINGOTOMY WITH TUBE PLACEMENT Right 07/13/2018   Procedure: MYRINGOTOMY WITH TUBE PLACEMENT;  Surgeon: Vernie Murders, MD;  Location: [facility];  Service: ENT;  Laterality: Right;   MYRINGOTOMY WITH TUBE PLACEMENT Right 06/04/2021   Procedure: MYRINGOTOMY WITH BUTTERFLY TUBE PLACEMENT;  Surgeon: Vernie Murders, MD;  Location: [facility];  Service: ENT;  Laterality: Right;   NASOPHARYNGOSCOPY EUSTATION TUBE BALLOON DILATION Right 07/13/2018   Procedure: NASOPHARYNGOSCOPY EUSTATION TUBE BALLOON DILATION;  Surgeon: Vernie Murders, MD;  Location: [facility];  Service: ENT;  Laterality: Right;   TONSILLECTOMY AND ADENOIDECTOMY  ~ 1980   adenoidectomy   TUBAL LIGATION  ~ 1987   TURBINATE REDUCTION Right 07/13/2018   Procedure: OUTFRACTURE TURBINATE;  Surgeon: Vernie Murders, MD;  Location: [facility];  Service: ENT;  Laterality: Right;   TYMPANOSTOMY TUBE PLACEMENT     VIDEO BRONCHOSCOPY WITH ENDOBRONCHIAL ULTRASOUND N/A 12/15/2020   Procedure: ROBOTIC VIDEO BRONCHOSCOPY WITH ENDOBRONCHIAL ULTRASOUND;  Surgeon: Leslye Peer, MD;  Location: [facility];  Service: Cardiopulmonary;  Laterality: N/A;   WRIST SURGERY Left    w/plate    REVIEW OF SYSTEMS:   Constitutional: Positive for fatigue. Negative for appetite change, chills, and unexpected weight change.  HENT: Negative for mouth sores, nosebleeds, sore throat and trouble swallowing.   Eyes: Negative for eye  problems and icterus.  Respiratory: Positive for baseline dyspnea on exertion and stable occasional cough in the AM. Negative for hemoptysis and wheezing.   Cardiovascular: Negative for chest pain and leg swelling.  Gastrointestinal: Negative for abdominal pain, nausea (none at this time), constipation, diarrhea, and vomiting.  Genitourinary: Negative for bladder incontinence, difficulty urinating, dysuria, frequency and hematuria.   Musculoskeletal: Negative for back pain, gait problem, neck pain and neck stiffness.  Skin: Negative for itching and rash.  Neurological: Negative for dizziness, extremity weakness, gait problem, headaches, light-headedness and seizures.  Hematological: Positive for left supraclavicular adenopathy (improving with radiation). Does not bruise/bleed easily.  Psychiatric/Behavioral: Negative for confusion, depression and sleep disturbance. The patient is not nervous/anxious.       PHYSICAL EXAMINATION:  Blood pressure 118/66, pulse (!) 104, temperature 98.1 F (36.7 C), temperature source Temporal, resp. rate 17, height 5\' 2"  (1.575 m), weight 108 lb 4.8 oz (49.1 kg), SpO2 98%.  ECOG PERFORMANCE STATUS: 1  Physical Exam  Constitutional: Oriented to person, place, and time and well-developed, well-nourished, and in no distress.  HENT:  Head: Normocephalic and atraumatic.  Mouth/Throat: Oropharynx is clear and moist. No oropharyngeal exudate.  Eyes: Conjunctivae are normal. Right eye exhibits no discharge. Left eye exhibits no discharge. No scleral icterus.  Neck: Normal range of motion. Neck supple.  Cardiovascular: Normal rate, regular rhythm, normal heart sounds and intact distal pulses.   Pulmonary/Chest: Effort normal and breath sounds normal. No respiratory distress. No wheezes. No rales.  Abdominal: Soft. Bowel sounds are normal. Exhibits no distension and no mass. There is no tenderness.  Musculoskeletal: Normal range of motion. Exhibits no edema.   Lymphadenopathy:    Left supraclavicular adenopathy.  Neurological: Alert and oriented to person, place, and time. Exhibits normal muscle tone. Gait normal. Coordination normal.  Skin: Skin is warm and dry. No rash noted. Not diaphoretic. No erythema. No pallor.  Psychiatric: Mood, memory and judgment normal.  Vitals reviewed.  LABORATORY DATA: Lab Results  Component Value Date   WBC 4.0  12/29/2022   HGB 10.9 (L) 12/29/2022   HCT 31.5 (L) 12/29/2022   MCV 105.7 (H) 12/29/2022   PLT 206 12/29/2022      Chemistry      Component Value Date/Time   NA 141 12/29/2022 1315   K 3.7 12/29/2022 1315   CL 106 12/29/2022 1315   CO2 30 12/29/2022 1315   BUN 11 12/29/2022 1315   CREATININE 0.87 12/29/2022 1315      Component Value Date/Time   CALCIUM 9.5 12/29/2022 1315   ALKPHOS 138 (H) 12/29/2022 1315   AST 26 12/29/2022 1315   ALT 37 12/29/2022 1315   BILITOT 0.4 12/29/2022 1315       RADIOGRAPHIC STUDIES:  No results found.   ASSESSMENT/PLAN:  This is a very pleasant 57 year old Caucasian female with recurrent lung cancer initially diagnosed as stage IIIB (T1b, N3, M0) non-small cell lung cancer, adenocarcinoma.  She presented with a right upper lobe spiculated nodule and right hilar, mediastinal, and left supraclavicular adenopathy.  She was diagnosed in November 2022.   Therefore, the patient had guardant 360 performed which showed no actionable mutations.  Her PD-L1 expression is less than 1%.  She was found to have metastatic disease in February 2024   The patient completed a course of concurrent chemoradiation with carboplatin for an AUC of 2 and paclitaxel 45 mg per metered square.  She status post 7 cycles.  She had some significant odynophagia and dysphagia from treatment.   She was seen by Dr. Christella Hartigan while admitted to the hospital and a EGD was performed which showed single 1cm oval shaped very shallow ulceration.  She recovered.   She then had consolidation  immunotherapy with Imfinzi 1500 mg IV every 4 weeks.  She is status post 3 cycles.  She developed suspicious immunotherapy mediated pneumonitis and this was ultimately discontinued.   The patient was seen by Dr. Roselind Messier and started palliative radiotherapy to the enlarging right supraclavicular lymphadenopathy. This was completed on October 28, 2021.    The patient was found to have metastatic disease in February 2024 with several lymphadenopathy in the chest as well as supraclavicular, upper abdomen as well as suspicious small liver metastasis. 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    She is currently undergoing systemic chemotherapy with carboplatin for AUC of 5, Alimta 500 Mg/M2 and Keytruda 200 Mg IV every 3 weeks status post 12 cycles.  Starting from cycle #3, Dr. Arbutus Ped reduced her dose of carboplatin to AUC of 4 and Alimta 400 Mg/M2. Starting from cycle #5 the patient started on maintenance treatment with Alimta and Keytruda every 3 weeks.   The most recent scan from November 2024 showed no evidence of disease progression except for progression of a single cluster of partially necrotic lymph nodes above the left clavicle.    She was seen by Dr. Roselind Messier who is arranging radiation. The last day of radiation is on 01/07/23 to the left clavicle lymph nodes.     Labs were reviewed.  Recommend that she proceed with cycle #13 today scheduled.   We will see her back for follow-up visit in 3 weeks for evaluation repeat blood work before undergoing cycle number 14.  I have refilled her antiemetic.     The patient was advised to call immediately if she has any concerning symptoms in the interval. The patient  voices understanding of current disease status and treatment options and is in agreement with the current care  plan. All questions were answered. The patient knows to call the clinic with any problems, questions or concerns. We can certainly see the patient much sooner if necessary    No orders of the defined types were placed in this encounter.    The total time spent in the appointment was 20-29 minutes  Loomis Anacker L Kc Summerson, PA-C 12/29/22

## 2022-12-23 ENCOUNTER — Ambulatory Visit
Admission: RE | Admit: 2022-12-23 | Discharge: 2022-12-23 | Disposition: A | Discharge status: Home / Self Care | Payer: Commercial Managed Care - PPO | Source: Ambulatory Visit | Attending: Radiation Oncology | Admitting: Radiation Oncology

## 2022-12-23 ENCOUNTER — Other Ambulatory Visit: Payer: Self-pay

## 2022-12-23 DIAGNOSIS — Z5112 Encounter for antineoplastic immunotherapy: Secondary | ICD-10-CM | POA: Diagnosis not present

## 2022-12-23 DIAGNOSIS — C3491 Malignant neoplasm of unspecified part of right bronchus or lung: Secondary | ICD-10-CM

## 2022-12-23 LAB — RAD ONC ARIA SESSION SUMMARY
Course Elapsed Days: 0
Plan Fractions Treated to Date: 1
Plan Prescribed Dose Per Fraction: 3 Gy
Plan Total Fractions Prescribed: 10
Plan Total Prescribed Dose: 30 Gy
Reference Point Dosage Given to Date: 3 Gy
Reference Point Session Dosage Given: 3 Gy
Session Number: 1

## 2022-12-24 ENCOUNTER — Ambulatory Visit
Admission: RE | Admit: 2022-12-24 | Discharge: 2022-12-24 | Disposition: A | Discharge status: Home / Self Care | Payer: Commercial Managed Care - PPO | Source: Ambulatory Visit | Attending: Radiation Oncology | Admitting: Radiation Oncology

## 2022-12-24 ENCOUNTER — Other Ambulatory Visit: Payer: Self-pay

## 2022-12-24 DIAGNOSIS — Z5112 Encounter for antineoplastic immunotherapy: Secondary | ICD-10-CM | POA: Diagnosis not present

## 2022-12-24 LAB — RAD ONC ARIA SESSION SUMMARY
Course Elapsed Days: 1
Plan Fractions Treated to Date: 2
Plan Prescribed Dose Per Fraction: 3 Gy
Plan Total Fractions Prescribed: 10
Plan Total Prescribed Dose: 30 Gy
Reference Point Dosage Given to Date: 6 Gy
Reference Point Session Dosage Given: 3 Gy
Session Number: 2

## 2022-12-26 ENCOUNTER — Ambulatory Visit: Payer: Commercial Managed Care - PPO

## 2022-12-27 ENCOUNTER — Other Ambulatory Visit: Payer: Self-pay

## 2022-12-27 ENCOUNTER — Ambulatory Visit
Admission: RE | Admit: 2022-12-27 | Discharge: 2022-12-27 | Disposition: A | Discharge status: Home / Self Care | Payer: Commercial Managed Care - PPO | Source: Ambulatory Visit | Attending: Radiation Oncology | Admitting: Radiation Oncology

## 2022-12-27 ENCOUNTER — Encounter: Payer: Self-pay | Admitting: Internal Medicine

## 2022-12-27 DIAGNOSIS — Z5112 Encounter for antineoplastic immunotherapy: Secondary | ICD-10-CM | POA: Diagnosis not present

## 2022-12-27 LAB — RAD ONC ARIA SESSION SUMMARY
Course Elapsed Days: 4
Plan Fractions Treated to Date: 3
Plan Prescribed Dose Per Fraction: 3 Gy
Plan Total Fractions Prescribed: 10
Plan Total Prescribed Dose: 30 Gy
Reference Point Dosage Given to Date: 9 Gy
Reference Point Session Dosage Given: 3 Gy
Session Number: 3

## 2022-12-28 ENCOUNTER — Ambulatory Visit
Admission: RE | Admit: 2022-12-28 | Discharge: 2022-12-28 | Disposition: A | Discharge status: Home / Self Care | Payer: Commercial Managed Care - PPO | Source: Ambulatory Visit | Attending: Radiation Oncology | Admitting: Radiation Oncology

## 2022-12-28 ENCOUNTER — Other Ambulatory Visit: Payer: Self-pay

## 2022-12-28 DIAGNOSIS — Z5112 Encounter for antineoplastic immunotherapy: Secondary | ICD-10-CM | POA: Diagnosis not present

## 2022-12-28 LAB — RAD ONC ARIA SESSION SUMMARY
Course Elapsed Days: 5
Plan Fractions Treated to Date: 4
Plan Prescribed Dose Per Fraction: 3 Gy
Plan Total Fractions Prescribed: 10
Plan Total Prescribed Dose: 30 Gy
Reference Point Dosage Given to Date: 12 Gy
Reference Point Session Dosage Given: 3 Gy
Session Number: 4

## 2022-12-29 ENCOUNTER — Other Ambulatory Visit: Payer: Self-pay

## 2022-12-29 ENCOUNTER — Inpatient Hospital Stay (HOSPITAL_BASED_OUTPATIENT_CLINIC_OR_DEPARTMENT_OTHER): Payer: Commercial Managed Care - PPO | Admitting: Physician Assistant

## 2022-12-29 ENCOUNTER — Inpatient Hospital Stay: Payer: Commercial Managed Care - PPO

## 2022-12-29 ENCOUNTER — Ambulatory Visit
Admission: RE | Admit: 2022-12-29 | Discharge: 2022-12-29 | Disposition: A | Discharge status: Home / Self Care | Payer: Commercial Managed Care - PPO | Source: Ambulatory Visit | Attending: Radiation Oncology | Admitting: Radiation Oncology

## 2022-12-29 VITALS — HR 98

## 2022-12-29 VITALS — BP 118/66 | HR 104 | Temp 98.1°F | Resp 17 | Ht 62.0 in | Wt 108.3 lb

## 2022-12-29 DIAGNOSIS — C3491 Malignant neoplasm of unspecified part of right bronchus or lung: Secondary | ICD-10-CM

## 2022-12-29 DIAGNOSIS — C3411 Malignant neoplasm of upper lobe, right bronchus or lung: Secondary | ICD-10-CM | POA: Diagnosis not present

## 2022-12-29 DIAGNOSIS — Z5111 Encounter for antineoplastic chemotherapy: Secondary | ICD-10-CM | POA: Diagnosis not present

## 2022-12-29 DIAGNOSIS — Z95828 Presence of other vascular implants and grafts: Secondary | ICD-10-CM

## 2022-12-29 DIAGNOSIS — T451X5A Adverse effect of antineoplastic and immunosuppressive drugs, initial encounter: Secondary | ICD-10-CM

## 2022-12-29 DIAGNOSIS — Z5112 Encounter for antineoplastic immunotherapy: Secondary | ICD-10-CM

## 2022-12-29 LAB — CBC WITH DIFFERENTIAL (CANCER CENTER ONLY)
Abs Immature Granulocytes: 0.01 10*3/uL (ref 0.00–0.07)
Basophils Absolute: 0 10*3/uL (ref 0.0–0.1)
Basophils Relative: 0 %
Eosinophils Absolute: 0.3 10*3/uL (ref 0.0–0.5)
Eosinophils Relative: 6 %
HCT: 31.5 % — ABNORMAL LOW (ref 36.0–46.0)
Hemoglobin: 10.9 g/dL — ABNORMAL LOW (ref 12.0–15.0)
Immature Granulocytes: 0 %
Lymphocytes Relative: 20 %
Lymphs Abs: 0.8 10*3/uL (ref 0.7–4.0)
MCH: 36.6 pg — ABNORMAL HIGH (ref 26.0–34.0)
MCHC: 34.6 g/dL (ref 30.0–36.0)
MCV: 105.7 fL — ABNORMAL HIGH (ref 80.0–100.0)
Monocytes Absolute: 0.5 10*3/uL (ref 0.1–1.0)
Monocytes Relative: 14 %
Neutro Abs: 2.4 10*3/uL (ref 1.7–7.7)
Neutrophils Relative %: 60 %
Platelet Count: 206 10*3/uL (ref 150–400)
RBC: 2.98 MIL/uL — ABNORMAL LOW (ref 3.87–5.11)
RDW: 14 % (ref 11.5–15.5)
WBC Count: 4 10*3/uL (ref 4.0–10.5)
nRBC: 0 % (ref 0.0–0.2)

## 2022-12-29 LAB — RAD ONC ARIA SESSION SUMMARY
Course Elapsed Days: 6
Plan Fractions Treated to Date: 5
Plan Prescribed Dose Per Fraction: 3 Gy
Plan Total Fractions Prescribed: 10
Plan Total Prescribed Dose: 30 Gy
Reference Point Dosage Given to Date: 15 Gy
Reference Point Session Dosage Given: 3 Gy
Session Number: 5

## 2022-12-29 LAB — CMP (CANCER CENTER ONLY)
ALT: 37 U/L (ref 0–44)
AST: 26 U/L (ref 15–41)
Albumin: 3.8 g/dL (ref 3.5–5.0)
Alkaline Phosphatase: 138 U/L — ABNORMAL HIGH (ref 38–126)
Anion gap: 5 (ref 5–15)
BUN: 11 mg/dL (ref 6–20)
CO2: 30 mmol/L (ref 22–32)
Calcium: 9.5 mg/dL (ref 8.9–10.3)
Chloride: 106 mmol/L (ref 98–111)
Creatinine: 0.87 mg/dL (ref 0.44–1.00)
GFR, Estimated: >60 mL/min (ref >60)
Glucose, Bld: 114 mg/dL — ABNORMAL HIGH (ref 70–99)
Potassium: 3.7 mmol/L (ref 3.5–5.1)
Sodium: 141 mmol/L (ref 135–145)
Total Bilirubin: 0.4 mg/dL (ref <1.2)
Total Protein: 7.3 g/dL (ref 6.5–8.1)

## 2022-12-29 LAB — SAMPLE TO BLOOD BANK

## 2022-12-29 MED ORDER — SODIUM CHLORIDE 0.9 % IV SOLN
200.0000 mg | Freq: Once | INTRAVENOUS | Status: AC
  Administered 2022-12-29: 200 mg via INTRAVENOUS
  Filled 2022-12-29: qty 200

## 2022-12-29 MED ORDER — HEPARIN SOD (PORK) LOCK FLUSH 100 UNIT/ML IV SOLN
500.0000 [IU] | Freq: Once | INTRAVENOUS | Status: AC | PRN
Start: 2022-12-29 — End: 2022-12-29
  Administered 2022-12-29: 500 [IU]

## 2022-12-29 MED ORDER — SODIUM CHLORIDE 0.9 % IV SOLN: Freq: Once | INTRAVENOUS | Status: AC

## 2022-12-29 MED ORDER — PROCHLORPERAZINE MALEATE 10 MG PO TABS
10.0000 mg | ORAL_TABLET | Freq: Once | ORAL | Status: AC
Start: 2022-12-29 — End: 2022-12-29
  Administered 2022-12-29: 10 mg via ORAL
  Filled 2022-12-29: qty 1

## 2022-12-29 MED ORDER — SODIUM CHLORIDE 0.9% FLUSH
10.0000 mL | INTRAVENOUS | Status: DC | PRN
  Administered 2022-12-29: 10 mL

## 2022-12-29 MED ORDER — SODIUM CHLORIDE 0.9% FLUSH
10.0000 mL | Freq: Once | INTRAVENOUS | Status: AC
  Administered 2022-12-29: 10 mL

## 2022-12-29 MED ORDER — SODIUM CHLORIDE 0.9 % IV SOLN
400.0000 mg/m2 | Freq: Once | INTRAVENOUS | Status: AC
  Administered 2022-12-29: 600 mg via INTRAVENOUS
  Filled 2022-12-29: qty 20

## 2022-12-29 MED ORDER — PROCHLORPERAZINE MALEATE 10 MG PO TABS: 10.0000 mg | ORAL_TABLET | Freq: Four times a day (QID) | ORAL | 2 refills | Status: AC | PRN

## 2022-12-29 NOTE — Patient Instructions (Signed)
[facility] - A DEPT OF [facility]  Discharge Instructions: Thank you for choosing [facility] to provide your oncology and hematology care.   If you have a lab appointment with the Cancer Center, please go directly to the Cancer Center and check in at the registration area.   Wear comfortable clothing and clothing appropriate for easy access to any Portacath or PICC line.   We strive to give you quality time with your provider. You may need to reschedule your appointment if you arrive late (15 or more minutes).  Arriving late affects you and other patients whose appointments are after yours.  Also, if you miss three or more appointments without notifying the office, you may be dismissed from the clinic at the provider's discretion.      For prescription refill requests, have your pharmacy contact our office and allow 72 hours for refills to be completed.    Today you received the following chemotherapy and/or immunotherapy agents: Keytruda, Alimta.       To help prevent nausea and vomiting after your treatment, we encourage you to take your nausea medication as directed.  BELOW ARE SYMPTOMS THAT SHOULD BE REPORTED IMMEDIATELY: *FEVER GREATER THAN 100.4 F (38 C) OR HIGHER *CHILLS OR SWEATING *NAUSEA AND VOMITING THAT IS NOT CONTROLLED WITH YOUR NAUSEA MEDICATION *UNUSUAL SHORTNESS OF BREATH *UNUSUAL BRUISING OR BLEEDING *URINARY PROBLEMS (pain or burning when urinating, or frequent urination) *BOWEL PROBLEMS (unusual diarrhea, constipation, pain near the anus) TENDERNESS IN MOUTH AND THROAT WITH OR WITHOUT PRESENCE OF ULCERS (sore throat, sores in mouth, or a toothache) UNUSUAL RASH, SWELLING OR PAIN  UNUSUAL VAGINAL DISCHARGE OR ITCHING   Items with * indicate a potential emergency and should be followed up as soon as possible or go to the Emergency Department if any problems should occur.  Please show the CHEMOTHERAPY ALERT CARD or  IMMUNOTHERAPY ALERT CARD at check-in to the Emergency Department and triage nurse.  Should you have questions after your visit or need to cancel or reschedule your appointment, please contact [facility] - A DEPT OF [facility]  Dept: [phone]  and follow the prompts.  Office hours are 8:00 a.m. to 4:30 p.m. Monday - Friday. Please note that voicemails left after 4:00 p.m. may not be returned until the following business day.  We are closed weekends and major holidays. You have access to a nurse at all times for urgent questions. Please call the main number to the clinic Dept: [phone] and follow the prompts.   For any non-urgent questions, you may also contact your provider using MyChart. We now offer e-Visits for anyone 80 and older to request care online for non-urgent symptoms. For details visit [email or web address].   Also download the MyChart app! Go to the app store, search "MyChart", open the app, select [facility], and log in with your MyChart username and password.

## 2023-01-03 ENCOUNTER — Ambulatory Visit
Admission: RE | Admit: 2023-01-03 | Discharge: 2023-01-03 | Disposition: A | Discharge status: Home / Self Care | Payer: Commercial Managed Care - PPO | Source: Ambulatory Visit | Attending: Radiation Oncology | Admitting: Radiation Oncology

## 2023-01-03 ENCOUNTER — Other Ambulatory Visit: Payer: Self-pay

## 2023-01-03 ENCOUNTER — Other Ambulatory Visit: Payer: Self-pay | Admitting: Emergency Medicine

## 2023-01-03 DIAGNOSIS — Z5111 Encounter for antineoplastic chemotherapy: Secondary | ICD-10-CM | POA: Insufficient documentation

## 2023-01-03 DIAGNOSIS — C778 Secondary and unspecified malignant neoplasm of lymph nodes of multiple regions: Secondary | ICD-10-CM | POA: Insufficient documentation

## 2023-01-03 DIAGNOSIS — Z510 Encounter for antineoplastic radiation therapy: Secondary | ICD-10-CM | POA: Diagnosis present

## 2023-01-03 DIAGNOSIS — C3411 Malignant neoplasm of upper lobe, right bronchus or lung: Secondary | ICD-10-CM | POA: Insufficient documentation

## 2023-01-03 DIAGNOSIS — C7951 Secondary malignant neoplasm of bone: Secondary | ICD-10-CM | POA: Diagnosis not present

## 2023-01-03 DIAGNOSIS — Z5112 Encounter for antineoplastic immunotherapy: Secondary | ICD-10-CM | POA: Insufficient documentation

## 2023-01-03 LAB — RAD ONC ARIA SESSION SUMMARY
Course Elapsed Days: 11
Plan Fractions Treated to Date: 6
Plan Prescribed Dose Per Fraction: 3 Gy
Plan Total Fractions Prescribed: 10
Plan Total Prescribed Dose: 30 Gy
Reference Point Dosage Given to Date: 18 Gy
Reference Point Session Dosage Given: 3 Gy
Session Number: 6

## 2023-01-04 ENCOUNTER — Other Ambulatory Visit: Payer: Self-pay

## 2023-01-04 ENCOUNTER — Ambulatory Visit
Admission: RE | Admit: 2023-01-04 | Discharge: 2023-01-04 | Disposition: A | Discharge status: Home / Self Care | Payer: Commercial Managed Care - PPO | Source: Ambulatory Visit | Attending: Radiation Oncology | Admitting: Radiation Oncology

## 2023-01-04 DIAGNOSIS — Z5112 Encounter for antineoplastic immunotherapy: Secondary | ICD-10-CM | POA: Diagnosis not present

## 2023-01-04 LAB — RAD ONC ARIA SESSION SUMMARY
Course Elapsed Days: 12
Plan Fractions Treated to Date: 7
Plan Prescribed Dose Per Fraction: 3 Gy
Plan Total Fractions Prescribed: 10
Plan Total Prescribed Dose: 30 Gy
Reference Point Dosage Given to Date: 21 Gy
Reference Point Session Dosage Given: 3 Gy
Session Number: 7

## 2023-01-05 ENCOUNTER — Other Ambulatory Visit: Payer: Self-pay

## 2023-01-05 ENCOUNTER — Ambulatory Visit
Admission: RE | Admit: 2023-01-05 | Discharge: 2023-01-05 | Disposition: A | Discharge status: Home / Self Care | Payer: Commercial Managed Care - PPO | Source: Ambulatory Visit | Attending: Radiation Oncology | Admitting: Radiation Oncology

## 2023-01-05 DIAGNOSIS — Z5112 Encounter for antineoplastic immunotherapy: Secondary | ICD-10-CM | POA: Diagnosis not present

## 2023-01-05 LAB — RAD ONC ARIA SESSION SUMMARY
Course Elapsed Days: 13
Plan Fractions Treated to Date: 8
Plan Prescribed Dose Per Fraction: 3 Gy
Plan Total Fractions Prescribed: 10
Plan Total Prescribed Dose: 30 Gy
Reference Point Dosage Given to Date: 24 Gy
Reference Point Session Dosage Given: 3 Gy
Session Number: 8

## 2023-01-06 ENCOUNTER — Ambulatory Visit: Payer: Commercial Managed Care - PPO

## 2023-01-06 ENCOUNTER — Telehealth: Payer: Self-pay | Admitting: Radiation Oncology

## 2023-01-06 ENCOUNTER — Other Ambulatory Visit: Payer: Self-pay

## 2023-01-06 ENCOUNTER — Ambulatory Visit
Admission: RE | Admit: 2023-01-06 | Discharge: 2023-01-06 | Disposition: A | Discharge status: Home / Self Care | Payer: Commercial Managed Care - PPO | Source: Ambulatory Visit | Attending: Radiation Oncology | Admitting: Radiation Oncology

## 2023-01-06 DIAGNOSIS — Z5112 Encounter for antineoplastic immunotherapy: Secondary | ICD-10-CM | POA: Diagnosis not present

## 2023-01-06 LAB — RAD ONC ARIA SESSION SUMMARY
Course Elapsed Days: 14
Plan Fractions Treated to Date: 9
Plan Prescribed Dose Per Fraction: 3 Gy
Plan Total Fractions Prescribed: 10
Plan Total Prescribed Dose: 30 Gy
Reference Point Dosage Given to Date: 27 Gy
Reference Point Session Dosage Given: 3 Gy
Session Number: 9

## 2023-01-06 NOTE — Telephone Encounter (Signed)
Pt called to advise she is running a few mins late to her tx appt. Email sent to tx team to make sure they're aware.

## 2023-01-07 ENCOUNTER — Other Ambulatory Visit: Payer: Self-pay

## 2023-01-07 ENCOUNTER — Ambulatory Visit
Admission: RE | Admit: 2023-01-07 | Discharge: 2023-01-07 | Disposition: A | Discharge status: Home / Self Care | Payer: Commercial Managed Care - PPO | Source: Ambulatory Visit | Attending: Radiation Oncology | Admitting: Radiation Oncology

## 2023-01-07 DIAGNOSIS — Z5112 Encounter for antineoplastic immunotherapy: Secondary | ICD-10-CM | POA: Diagnosis not present

## 2023-01-07 LAB — RAD ONC ARIA SESSION SUMMARY
Course Elapsed Days: 15
Plan Fractions Treated to Date: 10
Plan Prescribed Dose Per Fraction: 3 Gy
Plan Total Fractions Prescribed: 10
Plan Total Prescribed Dose: 30 Gy
Reference Point Dosage Given to Date: 30 Gy
Reference Point Session Dosage Given: 3 Gy
Session Number: 10

## 2023-01-08 ENCOUNTER — Other Ambulatory Visit: Payer: Self-pay

## 2023-01-10 NOTE — Radiation Completion Notes (Signed)
Patient Name: Julie Nicholson, Julie Nicholson MRN: [id number] Date of Birth: [birth year] Referring Physician: Si Gaul, M.D. Date of Service: 2023-01-10 Radiation Oncologist: Arnette Schaumann, M.D. 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, face and neck Staging on 2021-02-09: Adenocarcinoma of right lung, stage 4 (HCC) T=cT1b, N=cN3, M=cM0 Intent: Palliative     ==========DELIVERED PLANS==========  First Treatment Date: 2022-12-23 Last Treatment Date: 2023-01-07   Plan Name: Scv_L Site: Sclav-LT Technique: IMRT Mode: Photon Dose Per Fraction: 3 Gy Prescribed Dose (Delivered / Prescribed): 30 Gy / 30 Gy Prescribed Fxs (Delivered / Prescribed): 10 / 10     ==========ON TREATMENT VISIT DATES========== 2022-12-28, 2023-01-04     ==========UPCOMING VISITS==========       ==========APPENDIX - ON TREATMENT VISIT NOTES==========   See weekly On Treatment Notes in Epic for details in the Media tab (listed as Progress notes on the On Treatment Visit Dates listed above).

## 2023-01-11 ENCOUNTER — Other Ambulatory Visit: Payer: Self-pay | Admitting: Physician Assistant

## 2023-01-11 DIAGNOSIS — C3411 Malignant neoplasm of upper lobe, right bronchus or lung: Secondary | ICD-10-CM

## 2023-01-12 ENCOUNTER — Other Ambulatory Visit: Payer: Self-pay | Admitting: Internal Medicine

## 2023-01-12 DIAGNOSIS — C3491 Malignant neoplasm of unspecified part of right bronchus or lung: Secondary | ICD-10-CM

## 2023-01-19 ENCOUNTER — Inpatient Hospital Stay (HOSPITAL_BASED_OUTPATIENT_CLINIC_OR_DEPARTMENT_OTHER): Payer: Commercial Managed Care - PPO | Admitting: Internal Medicine

## 2023-01-19 ENCOUNTER — Other Ambulatory Visit: Payer: Self-pay | Admitting: Internal Medicine

## 2023-01-19 ENCOUNTER — Inpatient Hospital Stay: Payer: Commercial Managed Care - PPO | Attending: Internal Medicine

## 2023-01-19 ENCOUNTER — Inpatient Hospital Stay: Payer: Commercial Managed Care - PPO

## 2023-01-19 ENCOUNTER — Encounter: Payer: Self-pay | Admitting: Internal Medicine

## 2023-01-19 VITALS — BP 118/65 | HR 95 | Temp 97.7°F | Resp 17 | Wt 105.9 lb

## 2023-01-19 DIAGNOSIS — Z5112 Encounter for antineoplastic immunotherapy: Secondary | ICD-10-CM | POA: Insufficient documentation

## 2023-01-19 DIAGNOSIS — C3411 Malignant neoplasm of upper lobe, right bronchus or lung: Secondary | ICD-10-CM | POA: Insufficient documentation

## 2023-01-19 DIAGNOSIS — T451X5A Adverse effect of antineoplastic and immunosuppressive drugs, initial encounter: Secondary | ICD-10-CM

## 2023-01-19 DIAGNOSIS — C3491 Malignant neoplasm of unspecified part of right bronchus or lung: Secondary | ICD-10-CM

## 2023-01-19 DIAGNOSIS — C787 Secondary malignant neoplasm of liver and intrahepatic bile duct: Secondary | ICD-10-CM | POA: Insufficient documentation

## 2023-01-19 DIAGNOSIS — M5431 Sciatica, right side: Secondary | ICD-10-CM | POA: Diagnosis not present

## 2023-01-19 DIAGNOSIS — Z5111 Encounter for antineoplastic chemotherapy: Secondary | ICD-10-CM | POA: Diagnosis present

## 2023-01-19 DIAGNOSIS — M779 Enthesopathy, unspecified: Secondary | ICD-10-CM | POA: Insufficient documentation

## 2023-01-19 DIAGNOSIS — C3490 Malignant neoplasm of unspecified part of unspecified bronchus or lung: Secondary | ICD-10-CM

## 2023-01-19 DIAGNOSIS — Z95828 Presence of other vascular implants and grafts: Secondary | ICD-10-CM

## 2023-01-19 LAB — CMP (CANCER CENTER ONLY)
ALT: 41 U/L (ref 0–44)
AST: 43 U/L — ABNORMAL HIGH (ref 15–41)
Albumin: 3.9 g/dL (ref 3.5–5.0)
Alkaline Phosphatase: 133 U/L — ABNORMAL HIGH (ref 38–126)
Anion gap: 5 (ref 5–15)
BUN: 9 mg/dL (ref 6–20)
CO2: 30 mmol/L (ref 22–32)
Calcium: 9.6 mg/dL (ref 8.9–10.3)
Chloride: 106 mmol/L (ref 98–111)
Creatinine: 0.79 mg/dL (ref 0.44–1.00)
GFR, Estimated: >60 mL/min (ref >60)
Glucose, Bld: 85 mg/dL (ref 70–99)
Potassium: 4.1 mmol/L (ref 3.5–5.1)
Sodium: 141 mmol/L (ref 135–145)
Total Bilirubin: 0.6 mg/dL (ref <1.2)
Total Protein: 6.9 g/dL (ref 6.5–8.1)

## 2023-01-19 LAB — CBC WITH DIFFERENTIAL (CANCER CENTER ONLY)
Abs Immature Granulocytes: 0 10*3/uL (ref 0.00–0.07)
Basophils Absolute: 0 10*3/uL (ref 0.0–0.1)
Basophils Relative: 0 %
Eosinophils Absolute: 0.2 10*3/uL (ref 0.0–0.5)
Eosinophils Relative: 5 %
HCT: 31.7 % — ABNORMAL LOW (ref 36.0–46.0)
Hemoglobin: 11.3 g/dL — ABNORMAL LOW (ref 12.0–15.0)
Immature Granulocytes: 0 %
Lymphocytes Relative: 20 %
Lymphs Abs: 0.6 10*3/uL — ABNORMAL LOW (ref 0.7–4.0)
MCH: 36.6 pg — ABNORMAL HIGH (ref 26.0–34.0)
MCHC: 35.6 g/dL (ref 30.0–36.0)
MCV: 102.6 fL — ABNORMAL HIGH (ref 80.0–100.0)
Monocytes Absolute: 0.5 10*3/uL (ref 0.1–1.0)
Monocytes Relative: 14 %
Neutro Abs: 1.9 10*3/uL (ref 1.7–7.7)
Neutrophils Relative %: 61 %
Platelet Count: 169 10*3/uL (ref 150–400)
RBC: 3.09 MIL/uL — ABNORMAL LOW (ref 3.87–5.11)
RDW: 14.3 % (ref 11.5–15.5)
WBC Count: 3.1 10*3/uL — ABNORMAL LOW (ref 4.0–10.5)
nRBC: 0 % (ref 0.0–0.2)

## 2023-01-19 LAB — SAMPLE TO BLOOD BANK

## 2023-01-19 MED ORDER — SODIUM CHLORIDE 0.9% FLUSH
10.0000 mL | INTRAVENOUS | Status: DC | PRN
Start: 2023-01-19 — End: 2023-01-19
  Administered 2023-01-19: 10 mL

## 2023-01-19 MED ORDER — SODIUM CHLORIDE 0.9% FLUSH
10.0000 mL | Freq: Once | INTRAVENOUS | Status: AC
  Administered 2023-01-19: 10 mL

## 2023-01-19 MED ORDER — PROCHLORPERAZINE MALEATE 10 MG PO TABS
10.0000 mg | ORAL_TABLET | Freq: Once | ORAL | Status: AC
  Administered 2023-01-19: 10 mg via ORAL
  Filled 2023-01-19: qty 1

## 2023-01-19 MED ORDER — ONDANSETRON HCL 4 MG/2ML IJ SOLN: 8.0000 mg | Freq: Once | INTRAMUSCULAR | Status: DC

## 2023-01-19 MED ORDER — PEMBROLIZUMAB CHEMO INJECTION 100 MG/4ML
200.0000 mg | Freq: Once | INTRAVENOUS | Status: AC
  Administered 2023-01-19: 200 mg via INTRAVENOUS
  Filled 2023-01-19: qty 200

## 2023-01-19 MED ORDER — SODIUM CHLORIDE 0.9 % IV SOLN: Freq: Once | INTRAVENOUS | Status: AC

## 2023-01-19 MED ORDER — HEPARIN SOD (PORK) LOCK FLUSH 100 UNIT/ML IV SOLN
500.0000 [IU] | Freq: Once | INTRAVENOUS | Status: AC | PRN
Start: 2023-01-19 — End: 2023-01-19
  Administered 2023-01-19: 500 [IU]

## 2023-01-19 MED ORDER — PEMETREXED DISODIUM CHEMO INJECTION 500 MG
400.0000 mg/m2 | Freq: Once | INTRAVENOUS | Status: AC
  Administered 2023-01-19: 600 mg via INTRAVENOUS
  Filled 2023-01-19: qty 20

## 2023-01-19 NOTE — Telephone Encounter (Signed)
Pt reqeusts refill for Zofran ODT. Done

## 2023-01-19 NOTE — Patient Instructions (Signed)
 [facility] - A DEPT OF [facility]  Discharge Instructions: Thank you for choosing [facility] to provide your oncology and hematology care.   If you have a lab appointment with the Cancer Center, please go directly to the Cancer Center and check in at the registration area.   Wear comfortable clothing and clothing appropriate for easy access to any Portacath or PICC line.   We strive to give you quality time with your provider. You may need to reschedule your appointment if you arrive late (15 or more minutes).  Arriving late affects you and other patients whose appointments are after yours.  Also, if you miss three or more appointments without notifying the office, you may be dismissed from the clinic at the provider's discretion.      For prescription refill requests, have your pharmacy contact our office and allow 72 hours for refills to be completed.    Today you received the following chemotherapy and/or immunotherapy agents: Keytruda, Alimta      To help prevent nausea and vomiting after your treatment, we encourage you to take your nausea medication as directed.  BELOW ARE SYMPTOMS THAT SHOULD BE REPORTED IMMEDIATELY: *FEVER GREATER THAN 100.4 F (38 C) OR HIGHER *CHILLS OR SWEATING *NAUSEA AND VOMITING THAT IS NOT CONTROLLED WITH YOUR NAUSEA MEDICATION *UNUSUAL SHORTNESS OF BREATH *UNUSUAL BRUISING OR BLEEDING *URINARY PROBLEMS (pain or burning when urinating, or frequent urination) *BOWEL PROBLEMS (unusual diarrhea, constipation, pain near the anus) TENDERNESS IN MOUTH AND THROAT WITH OR WITHOUT PRESENCE OF ULCERS (sore throat, sores in mouth, or a toothache) UNUSUAL RASH, SWELLING OR PAIN  UNUSUAL VAGINAL DISCHARGE OR ITCHING   Items with * indicate a potential emergency and should be followed up as soon as possible or go to the Emergency Department if any problems should occur.  Please show the CHEMOTHERAPY ALERT CARD or  IMMUNOTHERAPY ALERT CARD at check-in to the Emergency Department and triage nurse.  Should you have questions after your visit or need to cancel or reschedule your appointment, please contact [facility] - A DEPT OF [facility]  Dept: [phone]  and follow the prompts.  Office hours are 8:00 a.m. to 4:30 p.m. Monday - Friday. Please note that voicemails left after 4:00 p.m. may not be returned until the following business day.  We are closed weekends and major holidays. You have access to a nurse at all times for urgent questions. Please call the main number to the clinic Dept: [phone] and follow the prompts.   For any non-urgent questions, you may also contact your provider using MyChart. We now offer e-Visits for anyone 79 and older to request care online for non-urgent symptoms. For details visit [email or web address].   Also download the MyChart app! Go to the app store, search "MyChart", open the app, select [facility], and log in with your MyChart username and password.

## 2023-01-19 NOTE — Progress Notes (Signed)
[name] [phone]   [phone]  OFFICE PROGRESS NOTE  [name], Poss, NP [address] [place] [address]  DIAGNOSIS: Recurrent/metastatic non-small cell lung cancer initially diagnosed as stage IIIB  (T1b, N3, M0) non-small cell lung cancer, adenocarcinoma she presented with right upper lobe nodule in addition to bulky right hilar, mediastinal, and left supraclavicular lymphadenopathy diagnosed in November 2022.  The patient had evidence for disease recurrence in the mediastinal and right supraclavicular lymphadenopathy in August 2023.  The patient had evidence of metastatic disease in February 2024 with several supraclavicular, thoracic and precarinal lymphadenopathy as well as upper abdominal lymph nodes and small liver lesion.  DETECTED ALTERATION(S) / BIOMARKER(S) % CFDNA OR AMPLIFICATION ASSOCIATED FDA-APPROVED THERAPIES CLINICAL TRIAL AVAILABILITY [id number] ND 0.5 5 50 100 4.7%  RHOAG17E ND 0.5 5 50 100 1.8%  CTNNB1S37C ND 0.5 5 50 100 1.9%  BIOMARKER ADDITIONAL DETAILS Tumor Mutational Burden (TMB) 19.02 mut/Mb MSI Status Stable (MSS) PD-L1 Tumor Proportion Score (TPS)* <1%  Biomarker Findings By [facility] on 05/06/2022 Tumor Mutational Burden - 17 Muts/Mb Microsatellite status - MS-Stable Genomic Findings For a complete list of the genes assayed, please refer to the Appendix. CTNNB1 S37C CRKL amplification MAP2K4 loss exons 2-11 TP53 V143A 8 Disease relevant genes with no reportable alterations: ALK, BRAF, EGFR, ERBB2, KRAS, MET, RET, ROS1  PDL1 0%   PRIOR THERAPY:  1) Concurrent chemoradiation with carboplatin for an AUC of 2 and paclitaxel 45 mg per metered squared.  First dose on 01/05/2021.  Status post 7 cycles of treatment.  Last dose was given February 16, 2021. 2) Consolidation immunotherapy with Imfinzi 1500 Mg IV every 4 weeks.  First dose March 25, 2021.  Status post 3 cycles.  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 3) Palliative radiotherapy to the enlarging right supraclavicular lymphadenopathy under the care of Dr. Roselind Messier expected to be completed on October 28, 2021.   CURRENT THERAPY: Palliative systemic chemotherapy with carboplatin for AUC of 5, Alimta 500 Mg/M2 and Keytruda 200 Mg IV every 3 weeks.  First dose April 05, 2022.  Status post 13  cycles.  Starting from cycle #3 her dose of carboplatin was reduced to AUC of 4 and Alimta 400 Mg/M2.  Starting from cycle #5 the patient will be on maintenance treatment with Alimta and Keytruda every 3 weeks.  INTERVAL HISTORY: Julie Nicholson 57 y.o. female returns to the clinic today for follow-up visit. Discussed the use of AI scribe software for clinical note transcription with the patient, who gave verbal consent to proceed.  History of Present Illness   Julie Nicholson, a 57 year old with metastatic non-small cell lung cancer (adenocarcinoma), was initially diagnosed at stage 3B in November 2022. She underwent chemo-radiation and three cycles of immunotherapy with Imfinzi, which was discontinued due to the development of metastasis and lymphadenopathy. Following this, she started chemotherapy with Carboplatin, Alimta, and Keytruda for four cycles. She has been on Alimta and Keytruda every three weeks for the past 13 cycles.  Recently, she has been experiencing fatigue, which is her primary concern. She also reports intermittent swelling in her feet and pain in her right hip, which she attributes to her sciatic nerve. She has also been diagnosed with tendonitis in her elbow, the cause of which is unknown to her. She plans to consult an orthopedic doctor for the same.  She denies any chest pain, shortness of breath, or coughing up anything abnormal.  She does report occasional nausea but does not seem overly concerned about it. She has noticed a lymph node in her neck area that increases in  size at times and then disappears. Recently, it has disappeared. She has not reported any other new symptoms or concerns.        MEDICAL HISTORY: Past Medical History:  Diagnosis Date   Abdominal discomfort    Cancer (HCC)    Chronic headaches    due to allergies, sinus   COPD (chronic obstructive pulmonary disease) (HCC)    per 2012 chest xray   pt states she doesn not have this now (04/10/2013)   Deviated nasal septum    Eustachian tube dysfunction    GERD (gastroesophageal reflux disease)    occasional uses Tums / Rolaids   Hearing loss    right ear   High cholesterol    History of radiation therapy    right lung 01/07/2021-02/19/2021  Dr Antony Blackbird   Migraine    "only once in a blue moon since RX'd allergy shots" (04/10/2013)   Pancreatitis 02/08/2013   Pneumonia    Rhinitis, allergic     ALLERGIES:  is allergic to carboplatin.  MEDICATIONS:  Current Outpatient Medications  Medication Sig Dispense Refill   acetaminophen (TYLENOL) 500 MG tablet Take 1,000 mg by mouth every 6 (six) hours as needed for moderate pain.     albuterol (PROVENTIL) (2.5 MG/3ML) 0.083% nebulizer solution albuterol sulfate 2.5 mg/3 mL (0.083 %) solution for nebulization  USE 1 VIAL IN NEBULIZER EVERY 6 HOURS AS NEEDED FOR WHEEZING OR SHORTNESS OF BREATH     albuterol (VENTOLIN HFA) 108 (90 Base) MCG/ACT inhaler Inhale 1-2 puffs into the lungs every 6 (six) hours as needed for wheezing or shortness of breath. 18 g 3   aspirin EC 81 MG tablet Take 81 mg by mouth at bedtime.     atorvastatin (LIPITOR) 40 MG tablet Take 40 mg by mouth at bedtime.     azelastine (ASTELIN) 0.1 % nasal spray Place 1 spray into both nostrils 2 (two) times daily as needed. Use in each nostril as directed 30 mL 5   benzonatate (TESSALON) 200 MG capsule Take 1 capsule (200 mg total) by mouth 3 (three) times daily as needed. 30 capsule 0   Biotin 5000 MCG TABS Take 5,000 mcg by mouth at bedtime.     buPROPion (WELLBUTRIN XL)  150 MG 24 hr tablet Take 150 mg by mouth every morning.     Ca Carbonate-Mag Hydroxide (ROLAIDS PO) Take 1 tablet by mouth daily as needed (heartburn).     cetirizine (ZYRTEC ALLERGY) 10 MG tablet Take 1 tablet (10 mg total) by mouth daily as needed for rhinitis. 30 tablet 5   doxycycline (VIBRAMYCIN) 100 MG capsule Take 100 mg by mouth 2 (two) times daily.     famotidine (PEPCID) 20 MG tablet Take 1 tablet by mouth twice daily 60 tablet 0   fluticasone (FLONASE) 50 MCG/ACT nasal spray Place 2 sprays into both nostrils daily. 16 g 5   folic acid (FOLVITE) 1 MG tablet Take 1 tablet by mouth once daily 30 tablet 0   gabapentin (NEURONTIN) 300 MG capsule Take 300 mg by mouth.     hydrocortisone 1 % ointment Apply 1 Application topically 2 (two) times daily. 30 g 0   lidocaine-prilocaine (EMLA) cream APPLY TO PORT-A-CATH 30 TO 60 MINUTES BEFORE TREATMENT 30 g 0   linaCLOtide (LINZESS PO) Take by mouth.  Multiple Vitamins-Minerals (HAIR SKIN AND NAILS FORMULA PO) Take 1 tablet by mouth daily.     ondansetron (ZOFRAN-ODT) 8 MG disintegrating tablet Take 1 tablet (8 mg total) by mouth 3 (three) times daily as needed. 20 tablet 1   Oxycodone HCl 10 MG TABS Take 1 tablet by mouth 3 (three) times daily as needed (pain).     PARoxetine (PAXIL) 10 MG tablet Take 10 mg by mouth daily.     prochlorperazine (COMPAZINE) 10 MG tablet Take 1 tablet (10 mg total) by mouth every 6 (six) hours as needed. 30 tablet 2   rizatriptan (MAXALT-MLT) 10 MG disintegrating tablet Take 10 mg by mouth as needed for migraine. May repeat in 2 hours if needed     sertraline (ZOLOFT) 50 MG tablet Take 1 tablet by mouth daily.     Tiotropium Bromide-Olodaterol (STIOLTO RESPIMAT) 2.5-2.5 MCG/ACT AERS Inhale 2 puffs into the lungs daily. 12 g 3   No current facility-administered medications for this visit.    SURGICAL HISTORY:  Past Surgical History:  Procedure Laterality Date   ABDOMINAL HYSTERECTOMY  1995   tx  endometriosis, both ovaries removed   ADENOIDECTOMY     APPENDECTOMY  late 1990's   BIOPSY  02/26/2021   Procedure: BIOPSY;  Surgeon: Rachael Fee, MD;  Location: [facility] ENDOSCOPY;  Service: Endoscopy;;   BRONCHIAL BRUSHINGS  12/15/2020   Procedure: BRONCHIAL BRUSHINGS;  Surgeon: Leslye Peer, MD;  Location[facility];  Service: Cardiopulmonary;;   BRONCHIAL NEEDLE ASPIRATION BIOPSY  12/15/2020   Procedure: BRONCHIAL NEEDLE ASPIRATION BIOPSIES;  Surgeon: Leslye Peer, MD;  Location: [facility] ENDOSCOPY;  Service: Cardiopulmonary;;   CHOLECYSTECTOMY  04/10/2013   CHOLECYSTECTOMY N/A 04/10/2013   Procedure: LAPAROSCOPIC CHOLECYSTECTOMY WITH INTRAOPERATIVE CHOLANGIOGRAM;  Surgeon: Adolph Pollack, MD;  Location: [facility] OR;  Service: General;  Laterality: N/A;   ELECTROMAGNETIC NAVIGATION BROCHOSCOPY  12/15/2020   Procedure: ELECTROMAGNETIC NAVIGATION BRONCHOSCOPY;  Surgeon: Leslye Peer, MD;  Location: [facility] ENDOSCOPY;  Service: Cardiopulmonary;;   ESOPHAGOGASTRODUODENOSCOPY (EGD) WITH PROPOFOL N/A 02/26/2021   Procedure: ESOPHAGOGASTRODUODENOSCOPY (EGD) WITH PROPOFOL;  Surgeon: Rachael Fee, MD;  Location: [facility] ENDOSCOPY;  Service: Endoscopy;  Laterality: N/A;   EUS N/A 02/16/2013   Procedure: UPPER ENDOSCOPIC ULTRASOUND (EUS) LINEAR;  Surgeon: Rachael Fee, MD;  Location: [facility] ENDOSCOPY;  Service: Endoscopy;  Laterality: N/A;  radial linear   IR IMAGING GUIDED PORT INSERTION  06/30/2022   KNEE ARTHROSCOPY Right 1980's   "cartilage OR"   LAPAROSCOPIC ENDOMETRIOSIS FULGURATION  1980's   MYRINGOTOMY WITH TUBE PLACEMENT Right 07/13/2018   Procedure: MYRINGOTOMY WITH TUBE PLACEMENT;  Surgeon: Vernie Murders, MD;  Location: [facility] SUR[facility]ERY CNTR;  Service: ENT;  Laterality: Right;   MYRINGOTOMY WITH TUBE PLACEMENT Right 06/04/2021   Procedure: MYRINGOTOMY WITH BUTTERFLY TUBE PLACEMENT;  Surgeon: Vernie Murders, MD;  Location: [facility] SURGERY CNTR;  Service: ENT;  Laterality: Right;    NASOPHARYNGOSCOPY EUSTATION TUBE BALLOON DILATION Right 07/13/2018   Procedure: NASOPHARYNGOSCOPY EUSTATION TUBE BALLOON DILATION;  Surgeon: Vernie Murders, MD;  Location: [facility] SURGERY CNTR;  Service: ENT;  Laterality: Right;   TONSILLECTOMY AND ADENOIDECTOMY  ~ 1980   adenoidectomy   TUBAL LIGATION  ~ 1987   TURBINATE REDUCTION Right 07/13/2018   Procedure: OUTFRACTURE TURBINATE;  Surgeon: Vernie Murders, MD;  Location: [facility]TR;  Service: ENT;  Laterality: Right;   TYMPANOSTOMY TUBE PLACEMENT     VIDEO BRONCHOSCOPY WITH ENDOBRONCHIAL ULTRASOUND N/A 12/15/2020   Procedure: ROBOTIC VIDEO BRONCHOSCOPY WITH ENDOBRONCHIAL ULTRASOUND;  Surgeon:  Leslye Peer, MD;  Location: [facility] ENDOSCOPY;  Service: Cardiopulmonary;  Laterality: N/A;   WRIST SURGERY Left    w/plate    REVIEW OF SYSTEMS:  A comprehensive review of systems was negative except for: Constitutional: positive for fatigue Gastrointestinal: positive for nausea   PHYSICAL EXAMINATION: General appearance: alert, cooperative, fatigued, and no distress Head: Normocephalic, without obvious abnormality, atraumatic Neck: moderate anterior cervical adenopathy, no JVD, supple, symmetrical, trachea midline, and thyroid not enlarged, symmetric, no tenderness/mass/nodules Lymph nodes: Cervical, supraclavicular, and axillary nodes normal. Resp: clear to auscultation bilaterally Back: symmetric, no curvature. ROM normal. No CVA tenderness. Cardio: regular rate and rhythm, S1, S2 normal, no murmur, click, rub or gallop GI: soft, non-tender; bowel sounds normal; no masses,  no organomegaly Extremities: extremities normal, atraumatic, no cyanosis or edema  ECOG PERFORMANCE STATUS: 1 - Symptomatic but completely ambulatory  Blood pressure 118/65, pulse 95, temperature 97.7 F (36.5 C), temperature source Temporal, resp. rate 17, weight 105 lb 14.4 oz (48 kg), SpO2 97%.  LABORATORY DATA: Lab Results  Component Value Date   WBC 4.0  12/29/2022   HGB 10.9 (L) 12/29/2022   HCT 31.5 (L) 12/29/2022   MCV 105.7 (H) 12/29/2022   PLT 206 12/29/2022      Chemistry      Component Value Date/Time   NA 141 12/29/2022 1315   K 3.7 12/29/2022 1315   CL 106 12/29/2022 1315   CO2 30 12/29/2022 1315   BUN 11 12/29/2022 1315   CREATININE 0.87 12/29/2022 1315      Component Value Date/Time   CALCIUM 9.5 12/29/2022 1315   ALKPHOS 138 (H) 12/29/2022 1315   AST 26 12/29/2022 1315   ALT 37 12/29/2022 1315   BILITOT 0.4 12/29/2022 1315       RADIOGRAPHIC STUDIES: No results found.  ASSESSMENT AND PLAN: This is a very pleasant 57 years old white female with stage IIIB (T1b, N3, M0) non-small cell lung cancer, adenocarcinoma diagnosed in November 2022 with no actionable mutation and negative PD-L1 expression. The patient completed a course of concurrent chemoradiation with weekly carboplatin for AUC of 2 and paclitaxel 45 Mg/M2 status post 7 cycles.  She has been tolerating her treatment well except for the mild odynophagia and skin burns. She was also recently admitted to the hospital complaining of dysphagia and odynophagia secondary to radiation induced esophagitis.  She is feeling much better but continues to have residual dysphagia.  She is followed by gastroenterology and was seen by Dr. Christella Hartigan during her hospitalization. Her scan showed improvement of her disease. I recommended for the patient treatment with consolidation immunotherapy with Imfinzi 1500 Mg IV every 4 weeks.  Status post 3 cycles.  Last dose was given in April 2023.  Her treatment was discontinued secondary to suspicious immunotherapy mediated pneumonitis with significant shortness of breath at that time and she was treated with a tapered dose of prednisone. Unfortunately her scan showed evidence for disease recurrence with enlargement of lower right cervical lymph nodes as well as mediastinal lymphadenopathy.  She has palpable right cervical  lymphadenopathy. 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 The patient underwent ultrasound-guided core biopsy of the right supraclavicular lymph nodes but the final pathology showed necrotic tumor cells  with complete coagulative necrosis with focal fibrous tissue and histiocytic reaction.  She also had MRI of the brain that showed no evidence of metastatic disease to the brain. The patient was seen by Dr. Roselind Messier and started palliative radiotherapy to the enlarging right supraclavicular lymphadenopathy.  This was completed on October 28, 2021. The patient was found to have metastatic disease in February 2024 with several lymphadenopathy in the chest as well as supraclavicular, upper abdomen as well as suspicious small liver metastasis. 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  I will send the tissue biopsy to [facility] for molecular studies. She is currently undergoing systemic chemotherapy with carboplatin for AUC of 5, Alimta 500 Mg/M2 and Keytruda 200 Mg IV every 3 weeks status post 13 cycles.  Starting from cycle #3, I reduced her dose of carboplatin to AUC of 4 and Alimta 400 Mg/M2.  Starting from cycle #5 the patient will be on maintenance treatment with Alimta and Keytruda every 3 weeks. The patient has been tolerating this treatment fairly well with no concerning adverse effects.  Metastatic Non-Small Cell Lung Cancer (NSCLC), Adenocarcinoma 57 year old with metastatic NSCLC,  adenocarcinoma, initially stage III B (Nov 2022). Post-chemoradiation and three cycles of Imfinzi, now on carboplatin, Alimta, and Keytruda for 13 months. Reports fatigue, intermittent foot edema, and right hip sciatic pain. No chest pain, dyspnea, or significant GI symptoms. Neck lymph nodes currently non-palpable. Discussed chemotherapy continuation, side effects (fatigue, peripheral edema), and disease stabilization with regular scans. - Proceed with cycle 14 of chemotherapy today - Order scan of neck, chest, abdomen, and pelvis in two weeks - Schedule follow-up appointment in three weeks  Fatigue Persistent fatigue, likely multifactorial due to chemotherapy and malignancy. - Monitor fatigue levels and assess during the next visit  Peripheral Edema Intermittent foot swelling, possibly chemotherapy-related. - Monitor for changes in swelling and assess during the next visit  Sciatic Nerve Pain Right hip sciatic pain, possibly cancer or treatment-related. - Monitor pain levels and consider referral to an orthopedic specialist if symptoms persist  Tendonitis Elbow tendonitis without clear inciting event. Considering orthopedic consultation post-holidays. - Monitor symptoms and consider referral to an orthopedic specialist if symptoms persist  General Health Maintenance Generally well aside from cancer treatment-related issues. Supportive family, anticipating new grandchild. - Encourage maintaining a healthy lifestyle and regular follow-ups.   The patient was advised to call immediately if she has any other concerning symptoms in the interval. The patient voices understanding of current disease status and treatment options and is in agreement with the current care plan.  All questions were answered. The patient knows to call the clinic with any problems, questions or concerns. We can certainly see the patient much sooner if necessary.  The total time spent in the appointment was 30  minutes.  Disclaimer: This note was dictated with voice recognition software. Similar sounding words can inadvertently be transcribed and may not be corrected upon review.

## 2023-01-21 ENCOUNTER — Telehealth: Payer: Self-pay

## 2023-01-21 NOTE — Telephone Encounter (Signed)
Pt called concerning CT abd and needing to go in 2 hours early for PO contrast. She wants to know if this is necessary. Per MD he would prefer pt drink PO contrast. Pt aware and verbalized agreement.

## 2023-01-24 ENCOUNTER — Other Ambulatory Visit: Payer: Self-pay

## 2023-02-03 ENCOUNTER — Ambulatory Visit (HOSPITAL_COMMUNITY)
Admission: RE | Admit: 2023-02-03 | Discharge: 2023-02-03 | Disposition: A | Discharge status: Home / Self Care | Payer: Commercial Managed Care - PPO | Source: Ambulatory Visit | Attending: Internal Medicine | Admitting: Internal Medicine

## 2023-02-03 ENCOUNTER — Other Ambulatory Visit: Payer: Self-pay | Admitting: Internal Medicine

## 2023-02-03 ENCOUNTER — Ambulatory Visit (HOSPITAL_COMMUNITY): Payer: Commercial Managed Care - PPO

## 2023-02-03 DIAGNOSIS — C3490 Malignant neoplasm of unspecified part of unspecified bronchus or lung: Secondary | ICD-10-CM | POA: Diagnosis present

## 2023-02-03 MED ORDER — IOHEXOL 9 MG/ML PO SOLN
1000.0000 mL | Freq: Once | ORAL | Status: AC
  Administered 2023-02-03: 1000 mL via ORAL

## 2023-02-03 MED ORDER — IOHEXOL 300 MG/ML  SOLN
100.0000 mL | Freq: Once | INTRAMUSCULAR | Status: AC | PRN
  Administered 2023-02-03: 90 mL via INTRAVENOUS

## 2023-02-04 ENCOUNTER — Other Ambulatory Visit: Payer: Self-pay | Admitting: Emergency Medicine

## 2023-02-05 ENCOUNTER — Other Ambulatory Visit: Payer: Self-pay | Admitting: Physician Assistant

## 2023-02-05 DIAGNOSIS — C3411 Malignant neoplasm of upper lobe, right bronchus or lung: Secondary | ICD-10-CM

## 2023-02-09 ENCOUNTER — Inpatient Hospital Stay: Payer: Commercial Managed Care - PPO | Attending: Internal Medicine

## 2023-02-09 ENCOUNTER — Inpatient Hospital Stay (HOSPITAL_BASED_OUTPATIENT_CLINIC_OR_DEPARTMENT_OTHER): Payer: Commercial Managed Care - PPO | Admitting: Internal Medicine

## 2023-02-09 ENCOUNTER — Encounter: Payer: Self-pay | Admitting: Radiation Oncology

## 2023-02-09 ENCOUNTER — Inpatient Hospital Stay: Payer: Commercial Managed Care - PPO

## 2023-02-09 VITALS — BP 128/77 | HR 93 | Temp 98.0°F | Resp 16 | Ht 62.0 in | Wt 107.1 lb

## 2023-02-09 DIAGNOSIS — Z5111 Encounter for antineoplastic chemotherapy: Secondary | ICD-10-CM | POA: Diagnosis present

## 2023-02-09 DIAGNOSIS — T451X5A Adverse effect of antineoplastic and immunosuppressive drugs, initial encounter: Secondary | ICD-10-CM

## 2023-02-09 DIAGNOSIS — R519 Headache, unspecified: Secondary | ICD-10-CM | POA: Diagnosis not present

## 2023-02-09 DIAGNOSIS — C3491 Malignant neoplasm of unspecified part of right bronchus or lung: Secondary | ICD-10-CM

## 2023-02-09 DIAGNOSIS — Z5112 Encounter for antineoplastic immunotherapy: Secondary | ICD-10-CM | POA: Insufficient documentation

## 2023-02-09 DIAGNOSIS — C3411 Malignant neoplasm of upper lobe, right bronchus or lung: Secondary | ICD-10-CM | POA: Insufficient documentation

## 2023-02-09 DIAGNOSIS — R1310 Dysphagia, unspecified: Secondary | ICD-10-CM | POA: Insufficient documentation

## 2023-02-09 DIAGNOSIS — Z95828 Presence of other vascular implants and grafts: Secondary | ICD-10-CM

## 2023-02-09 DIAGNOSIS — Z79620 Long term (current) use of immunosuppressive biologic: Secondary | ICD-10-CM | POA: Diagnosis not present

## 2023-02-09 LAB — CMP (CANCER CENTER ONLY)
ALT: 20 U/L (ref 0–44)
AST: 25 U/L (ref 15–41)
Albumin: 3.8 g/dL (ref 3.5–5.0)
Alkaline Phosphatase: 123 U/L (ref 38–126)
Anion gap: 5 (ref 5–15)
BUN: 7 mg/dL (ref 6–20)
CO2: 29 mmol/L (ref 22–32)
Calcium: 9.4 mg/dL (ref 8.9–10.3)
Chloride: 107 mmol/L (ref 98–111)
Creatinine: 0.84 mg/dL (ref 0.44–1.00)
GFR, Estimated: >60 mL/min (ref >60)
Glucose, Bld: 81 mg/dL (ref 70–99)
Potassium: 4 mmol/L (ref 3.5–5.1)
Sodium: 141 mmol/L (ref 135–145)
Total Bilirubin: 0.5 mg/dL (ref 0.0–1.2)
Total Protein: 7.2 g/dL (ref 6.5–8.1)

## 2023-02-09 LAB — CBC WITH DIFFERENTIAL (CANCER CENTER ONLY)
Abs Immature Granulocytes: 0.01 10*3/uL (ref 0.00–0.07)
Basophils Absolute: 0 10*3/uL (ref 0.0–0.1)
Basophils Relative: 1 %
Eosinophils Absolute: 0.2 10*3/uL (ref 0.0–0.5)
Eosinophils Relative: 6 %
HCT: 31.9 % — ABNORMAL LOW (ref 36.0–46.0)
Hemoglobin: 11.3 g/dL — ABNORMAL LOW (ref 12.0–15.0)
Immature Granulocytes: 0 %
Lymphocytes Relative: 15 %
Lymphs Abs: 0.5 10*3/uL — ABNORMAL LOW (ref 0.7–4.0)
MCH: 36.3 pg — ABNORMAL HIGH (ref 26.0–34.0)
MCHC: 35.4 g/dL (ref 30.0–36.0)
MCV: 102.6 fL — ABNORMAL HIGH (ref 80.0–100.0)
Monocytes Absolute: 0.4 10*3/uL (ref 0.1–1.0)
Monocytes Relative: 11 %
Neutro Abs: 2.3 10*3/uL (ref 1.7–7.7)
Neutrophils Relative %: 67 %
Platelet Count: 189 10*3/uL (ref 150–400)
RBC: 3.11 MIL/uL — ABNORMAL LOW (ref 3.87–5.11)
RDW: 14.4 % (ref 11.5–15.5)
WBC Count: 3.5 10*3/uL — ABNORMAL LOW (ref 4.0–10.5)
nRBC: 0 % (ref 0.0–0.2)

## 2023-02-09 LAB — TSH: TSH: 3.881 u[IU]/mL (ref 0.350–4.500)

## 2023-02-09 MED ORDER — SODIUM CHLORIDE 0.9 % IV SOLN
400.0000 mg/m2 | Freq: Once | INTRAVENOUS | Status: AC
  Administered 2023-02-09: 600 mg via INTRAVENOUS
  Filled 2023-02-09: qty 20

## 2023-02-09 MED ORDER — HEPARIN SOD (PORK) LOCK FLUSH 100 UNIT/ML IV SOLN
500.0000 [IU] | Freq: Once | INTRAVENOUS | Status: AC | PRN
  Administered 2023-02-09: 500 [IU]

## 2023-02-09 MED ORDER — SODIUM CHLORIDE 0.9% FLUSH
10.0000 mL | Freq: Once | INTRAVENOUS | Status: AC
  Administered 2023-02-09: 10 mL

## 2023-02-09 MED ORDER — CYANOCOBALAMIN 1000 MCG/ML IJ SOLN
1000.0000 ug | Freq: Once | INTRAMUSCULAR | Status: AC
  Administered 2023-02-09: 1000 ug via INTRAMUSCULAR
  Filled 2023-02-09: qty 1

## 2023-02-09 MED ORDER — SODIUM CHLORIDE 0.9% FLUSH
10.0000 mL | INTRAVENOUS | Status: DC | PRN
  Administered 2023-02-09: 10 mL

## 2023-02-09 MED ORDER — SODIUM CHLORIDE 0.9 % IV SOLN: Freq: Once | INTRAVENOUS | Status: AC

## 2023-02-09 MED ORDER — SODIUM CHLORIDE 0.9 % IV SOLN
200.0000 mg | Freq: Once | INTRAVENOUS | Status: AC
Start: 2023-02-09 — End: 2023-02-09
  Administered 2023-02-09: 200 mg via INTRAVENOUS
  Filled 2023-02-09: qty 200

## 2023-02-09 MED ORDER — ONDANSETRON HCL 4 MG/2ML IJ SOLN
8.0000 mg | Freq: Once | INTRAMUSCULAR | Status: AC
  Administered 2023-02-09: 8 mg via INTRAVENOUS
  Filled 2023-02-09: qty 4

## 2023-02-09 NOTE — Progress Notes (Signed)
 [facility] [phone]   [phone]  OFFICE PROGRESS NOTE  Julie Nicholson, Keisler, NP [address] [place] [address]  DIAGNOSIS: Recurrent/metastatic non-small cell lung cancer initially diagnosed as stage IIIB  (T1b, N3, M0) non-small cell lung cancer, adenocarcinoma she presented with right upper lobe nodule in addition to bulky right hilar, mediastinal, and left supraclavicular lymphadenopathy diagnosed in November 2022.  The patient had evidence for disease recurrence in the mediastinal and right supraclavicular lymphadenopathy in August 2023.  The patient had evidence of metastatic disease in February 2024 with several supraclavicular, thoracic and precarinal lymphadenopathy as well as upper abdominal lymph nodes and small liver lesion.  DETECTED ALTERATION(S) / BIOMARKER(S) % CFDNA OR AMPLIFICATION ASSOCIATED FDA-APPROVED THERAPIES CLINICAL TRIAL AVAILABILITY [id number] ND 0.5 5 50 100 4.7%  RHOAG17E ND 0.5 5 50 100 1.8%  [id number] ND 0.5 5 50 100 1.9%  BIOMARKER ADDITIONAL DETAILS Tumor Mutational Burden (TMB) 19.02 mut/Mb MSI Status Stable (MSS) PD-L1 Tumor Proportion Score (TPS)* <1%  Biomarker Findings By [facility] on 05/06/2022 Tumor Mutational Burden - 17 Muts/Mb Microsatellite status - MS-Stable Genomic Findings For a complete list of the genes assayed, please refer to the Appendix. CTNNB1 S37C CRKL amplification MAP2K4 loss exons 2-11 TP53 V143A 8 Disease relevant genes with no reportable alterations: ALK, BRAF, EGFR, ERBB2, KRAS, MET, RET, ROS1  PDL1 0%   PRIOR THERAPY:  1) Concurrent chemoradiation with carboplatin  for an AUC of 2 and paclitaxel  45 mg per metered squared.  First dose on 01/05/2021.  Status post 7 cycles of treatment.  Last dose was given February 16, 2021. 2) Consolidation immunotherapy with Imfinzi  1500 Mg IV every 4 weeks.  First dose March 25, 2021.  Status post 3 cycles.  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 3) Palliative radiotherapy to the enlarging right supraclavicular lymphadenopathy under the care of Dr. Shannon expected to be completed on October 28, 2021.   CURRENT THERAPY: Palliative systemic chemotherapy with carboplatin  for AUC of 5, Alimta  500 Mg/M2 and Keytruda  200 Mg IV every 3 weeks.  First dose April 05, 2022.  Status post 14 cycles.  Starting from cycle #3 her dose of carboplatin  was reduced to AUC of 4 and Alimta  400 Mg/M2.  Starting from cycle #5 the patient will be on maintenance treatment with Alimta  and Keytruda  every 3 weeks.  INTERVAL HISTORY: Julie Nicholson 58 y.o. female returns to the clinic today for follow-up visit. Discussed the use of AI scribe software for clinical note transcription with the patient, who gave verbal consent to proceed.  History of Present Illness   The patient, a 58 year old with a history of adenocarcinoma, initiated chemotherapy in November 2022. The initial regimen included carboplatin , Alimta , and Keytruda  for four cycles, after which the treatment was continued with Alimta  and Keytruda  every three weeks. As of the current consultation, the patient has completed fourteen cycles of this regimen.  The patient reports feeling generally well, with no new complaints since the last visit three weeks prior. However, she mentions a recent onset of abdominal discomfort, which she attributes to possibly overusing stool softeners. She denies experiencing nausea, vomiting, or diarrhea.  The patient also reports a persistent dry cough, which is particularly noticeable upon waking in the morning but seems to improve during the day. She has been managing this symptom with Tessalon  (benzonatate ) capsules, which she finds helpful. She denies any chest pain but does note occasional feelings of chest  tightness.  Despite these symptoms, the patient denies any weight loss, fever, or chills. She also  reports positive personal news, including the anticipation of a new grandchild. The patient's most recent scans show a decrease in lymph node size, indicating a positive response to the ongoing chemotherapy regimen.       MEDICAL HISTORY: Past Medical History:  Diagnosis Date   Abdominal discomfort    Cancer (HCC)    Chronic headaches    due to allergies, sinus   COPD (chronic obstructive pulmonary disease) (HCC)    per 2012 chest xray   pt states she doesn not have this now (04/10/2013)   Deviated nasal septum    Eustachian tube dysfunction    GERD (gastroesophageal reflux disease)    occasional uses Tums / Rolaids   Hearing loss    right ear   High cholesterol    History of radiation therapy    right lung 01/07/2021-02/19/2021  Dr Lynwood Nasuti   History of radiation therapy    Lymph nodes of head,face,neck- 12/23/22-01/07/23- Dr. Lynwood Nasuti   Migraine    only once in a blue moon since RX'd allergy  shots (04/10/2013)   Pancreatitis 02/08/2013   Pneumonia    Rhinitis, allergic     ALLERGIES:  is allergic to carboplatin .  MEDICATIONS:  Current Outpatient Medications  Medication Sig Dispense Refill   acetaminophen  (TYLENOL ) 500 MG tablet Take 1,000 mg by mouth every 6 (six) hours as needed for moderate pain.     albuterol  (PROVENTIL ) (2.5 MG/3ML) 0.083% nebulizer solution albuterol  sulfate 2.5 mg/3 mL (0.083 %) solution for nebulization  USE 1 VIAL IN NEBULIZER EVERY 6 HOURS AS NEEDED FOR WHEEZING OR SHORTNESS OF BREATH     albuterol  (VENTOLIN  HFA) 108 (90 Base) MCG/ACT inhaler Inhale 1-2 puffs into the lungs every 6 (six) hours as needed for wheezing or shortness of breath. 18 g 3   aspirin  EC 81 MG tablet Take 81 mg by mouth at bedtime.     atorvastatin  (LIPITOR) 40 MG tablet Take 40 mg by mouth at bedtime.     azelastine  (ASTELIN ) 0.1 % nasal spray Place 1 spray into both nostrils 2 (two) times daily as needed. Use in each nostril as directed 30 mL 5   benzonatate   (TESSALON ) 200 MG capsule Take 1 capsule (200 mg total) by mouth 3 (three) times daily as needed. 30 capsule 0   Biotin 5000 MCG TABS Take 5,000 mcg by mouth at bedtime.     buPROPion  (WELLBUTRIN  XL) 150 MG 24 hr tablet Take 150 mg by mouth every morning.     Ca Carbonate-Mag Hydroxide (ROLAIDS PO) Take 1 tablet by mouth daily as needed (heartburn).     cetirizine  (ZYRTEC  ALLERGY ) 10 MG tablet Take 1 tablet (10 mg total) by mouth daily as needed for rhinitis. 30 tablet 5   doxycycline  (VIBRAMYCIN ) 100 MG capsule Take 100 mg by mouth 2 (two) times daily.     famotidine  (PEPCID ) 20 MG tablet Take 1 tablet by mouth twice daily 60 tablet 0   fluticasone  (FLONASE ) 50 MCG/ACT nasal spray Place 2 sprays into both nostrils daily. 16 g 5   folic acid  (FOLVITE ) 1 MG tablet Take 1 tablet by mouth once daily 30 tablet 0   gabapentin (NEURONTIN) 300 MG capsule Take 300 mg by mouth.     hydrocortisone  1 % ointment Apply 1 Application topically 2 (two) times daily. 30 g 0   lidocaine -prilocaine  (EMLA ) cream APPLY TO PORT-A-CATH 30 TO 60  MINUTES BEFORE TREATMENT 30 g 0   linaCLOtide (LINZESS PO) Take by mouth.     Multiple Vitamins-Minerals (HAIR SKIN AND NAILS FORMULA PO) Take 1 tablet by mouth daily.     ondansetron  (ZOFRAN -ODT) 8 MG disintegrating tablet DISSOLVE 1 TABLET IN MOUTH THREE TIMES DAILY AS NEEDED 20 tablet 0   Oxycodone  HCl 10 MG TABS Take 1 tablet by mouth 3 (three) times daily as needed (pain).     PARoxetine (PAXIL) 10 MG tablet Take 10 mg by mouth daily.     prochlorperazine  (COMPAZINE ) 10 MG tablet Take 1 tablet (10 mg total) by mouth every 6 (six) hours as needed. 30 tablet 2   rizatriptan (MAXALT-MLT) 10 MG disintegrating tablet Take 10 mg by mouth as needed for migraine. May repeat in 2 hours if needed     sertraline  (ZOLOFT ) 50 MG tablet Take 1 tablet by mouth daily.     Tiotropium Bromide-Olodaterol (STIOLTO RESPIMAT ) 2.5-2.5 MCG/ACT AERS Inhale 2 puffs into the lungs daily. 12 g 3    No current facility-administered medications for this visit.    SURGICAL HISTORY:  Past Surgical History:  Procedure Laterality Date   ABDOMINAL HYSTERECTOMY  1995   tx endometriosis, both ovaries removed   ADENOIDECTOMY     APPENDECTOMY  late 1990's   BIOPSY  02/26/2021   Procedure: BIOPSY;  Surgeon: Teressa Toribio SQUIBB, MD;  Location: [facility]COPY;  Service: Endoscopy;;   BRONCHIAL BRUSHINGS  12/15/2020   Procedure: BRONCHIAL BRUSHINGS;  Surgeon: Shelah Lamar RAMAN, MD;  Location: [facility] ENDOSCOPY;  Service: Cardiopulmonary;;   BRONCHIAL NEEDLE ASPIRATION BIOPSY  12/15/2020   Procedure: BRONCHIAL NEEDLE ASPIRATION BIOPSIES;  Surgeon: Shelah Lamar RAMAN, MD;  Location: [facility] ENDOSCOPY;  Service: Cardiopulmonary;;   CHOLECYSTECTOMY  04/10/2013   CHOLECYSTECTOMY N/A 04/10/2013   Procedure: LAPAROSCOPIC CHOLECYSTECTOMY WITH INTRAOPERATIVE CHOLANGIOGRAM;  Surgeon: Krystal JINNY Russell, MD;  Location: [facility] OR;  Service: General;  Laterality: N/A;   ELECTROMAGNETIC NAVIGATION BROCHOSCOPY  12/15/2020   Procedure: ELECTROMAGNETIC NAVIGATION BRONCHOSCOPY;  Surgeon: Shelah Lamar RAMAN, MD;  Location: [facility];  Service: Cardiopulmonary;;   ESOPHAGOGASTRODUODENOSCOPY (EGD) WITH PROPOFOL  N/A 02/26/2021   Procedure: ESOPHAGOGASTRODUODENOSCOPY (EGD) WITH PROPOFOL ;  Surgeon: Teressa Toribio SQUIBB, MD;  Location: [facility];  Service: Endoscopy;  Laterality: N/A;   EUS N/A 02/16/2013   Procedure: UPPER ENDOSCOPIC ULTRASOUND (EUS) LINEAR;  Surgeon: Toribio SQUIBB Teressa, MD;  Location: [facility];  Service: Endoscopy;  Laterality: N/A;  radial linear   IR IMAGING GUIDED PORT INSERTION  06/30/2022   KNEE ARTHROSCOPY Right 1980's   cartilage OR   LAPAROSCOPIC ENDOMETRIOSIS FULGURATION  1980's   MYRINGOTOMY WITH TUBE PLACEMENT Right 07/13/2018   Procedure: MYRINGOTOMY WITH TUBE PLACEMENT;  Surgeon: Edda Mt, MD;  Location: [facility];  Service: ENT;  Laterality: Right;   MYRINGOTOMY WITH TUBE PLACEMENT Right  06/04/2021   Procedure: MYRINGOTOMY WITH BUTTERFLY TUBE PLACEMENT;  Surgeon: Edda Mt, MD;  Location: [facility];  Service: ENT;  Laterality: Right;   NASOPHARYNGOSCOPY EUSTATION TUBE BALLOON DILATION Right 07/13/2018   Procedure: NASOPHARYNGOSCOPY EUSTATION TUBE BALLOON DILATION;  Surgeon: Edda Mt, MD;  Location: [facility];  Service: ENT;  Laterality: Right;   TONSILLECTOMY AND ADENOIDECTOMY  ~ 1980   adenoidectomy   TUBAL LIGATION  ~ 1987   TURBINATE REDUCTION Right 07/13/2018   Procedure: OUTFRACTURE TURBINATE;  Surgeon: Edda Mt, MD;  Location: [facility];  Service: ENT;  Laterality: Right;   TYMPANOSTOMY TUBE PLACEMENT     VIDEO BRONCHOSCOPY WITH ENDOBRONCHIAL  ULTRASOUND N/A 12/15/2020   Procedure: ROBOTIC VIDEO BRONCHOSCOPY WITH ENDOBRONCHIAL ULTRASOUND;  Surgeon: Shelah Lamar RAMAN, MD;  Location: [facility] ENDOSCOPY;  Service: Cardiopulmonary;  Laterality: N/A;   WRIST SURGERY Left    w/plate    REVIEW OF SYSTEMS:  Constitutional: positive for fatigue Eyes: negative Ears, nose, mouth, throat, and face: negative Respiratory: positive for cough Cardiovascular: negative Gastrointestinal: positive for abdominal pain and constipation Genitourinary:negative Integument/breast: negative Hematologic/lymphatic: negative Musculoskeletal:negative Neurological: negative Behavioral/Psych: negative Endocrine: negative Allergic/Immunologic: negative   PHYSICAL EXAMINATION: General appearance: alert, cooperative, fatigued, and no distress Head: Normocephalic, without obvious abnormality, atraumatic Neck: moderate anterior cervical adenopathy, no JVD, supple, symmetrical, trachea midline, and thyroid  not enlarged, symmetric, no tenderness/mass/nodules Lymph nodes: Cervical, supraclavicular, and axillary nodes normal. Resp: clear to auscultation bilaterally Back: symmetric, no curvature. ROM normal. No CVA tenderness. Cardio: regular rate and rhythm, S1, S2  normal, no murmur, click, rub or gallop GI: soft, non-tender; bowel sounds normal; no masses,  no organomegaly Extremities: extremities normal, atraumatic, no cyanosis or edema Neurologic: Alert and oriented X 3, normal strength and tone. Normal symmetric reflexes. Normal coordination and gait  ECOG PERFORMANCE STATUS: 1 - Symptomatic but completely ambulatory  Blood pressure 128/77, pulse 93, temperature 98 F (36.7 C), temperature source Temporal, resp. rate 16, height 5' 2 (1.575 m), weight 107 lb 1.6 oz (48.6 kg), SpO2 100%.  LABORATORY DATA: Lab Results  Component Value Date   WBC 3.5 (L) 02/09/2023   HGB 11.3 (L) 02/09/2023   HCT 31.9 (L) 02/09/2023   MCV 102.6 (H) 02/09/2023   PLT 189 02/09/2023      Chemistry      Component Value Date/Time   NA 141 01/19/2023 1117   K 4.1 01/19/2023 1117   CL 106 01/19/2023 1117   CO2 30 01/19/2023 1117   BUN 9 01/19/2023 1117   CREATININE 0.79 01/19/2023 1117      Component Value Date/Time   CALCIUM  9.6 01/19/2023 1117   ALKPHOS 133 (H) 01/19/2023 1117   AST 43 (H) 01/19/2023 1117   ALT 41 01/19/2023 1117   BILITOT 0.6 01/19/2023 1117       RADIOGRAPHIC STUDIES: CT Soft Tissue Neck W Contrast Result Date: 02/08/2023 CLINICAL DATA:  Neck mass, nonpulsatile. Non-small cell lung cancer. EXAM: CT NECK WITH CONTRAST TECHNIQUE: Multidetector CT imaging of the neck was performed using the standard protocol following the bolus administration of intravenous contrast. RADIATION DOSE REDUCTION: This exam was performed according to the departmental dose-optimization program which includes automated exposure control, adjustment of the mA and/or kV according to patient size and/or use of iterative reconstruction technique. CONTRAST:  90mL OMNIPAQUE  IOHEXOL  300 MG/ML  SOLN COMPARISON:  Neck CT 11/25/2022 FINDINGS: Pharynx and larynx: No evidence of a mass or swelling. Widely patent airway. No fluid collection or inflammatory changes in the  parapharyngeal or retropharyngeal spaces. Salivary glands: No inflammation, mass, or stone. Thyroid : Unremarkable. Lymph nodes: Necrotic left level V and left supraclavicular lymph nodes on the prior study have decreased in size and are now all subcentimeter in short axis (for example, residual small nodes on series 2, images 45 and 56). Scattered subcentimeter short axis lymph nodes elsewhere in the neck bilaterally are unchanged. No new cervical lymphadenopathy is evident. Vascular: Major vascular structures of the neck are patent. Right IJ Port-A-Cath. Limited intracranial: Unremarkable. Visualized orbits: Unremarkable. Mastoids and visualized paranasal sinuses: Mucosal thickening and mucous retention cysts in the maxillary sinuses. No significant mastoid fluid. Skeleton: No suspicious bone lesion. Multilevel disc  degeneration, most advanced at C4-5. Upper chest: Reported separately on the contemporaneous chest CT. Other: Julie Nicholson. IMPRESSION: Decreased size of necrotic left level V and supraclavicular lymph nodes, now subcentimeter. No new evidence of metastatic disease in the neck. Electronically Signed   By: Dasie Hamburg M.D.   On: 02/08/2023 14:56   CT CHEST ABDOMEN PELVIS W CONTRAST Result Date: 02/03/2023 CLINICAL DATA:  History of stage IV adenocarcinoma of the right lung, restaging. * Tracking Code: BO * EXAM: CT CHEST, ABDOMEN, AND PELVIS WITH CONTRAST TECHNIQUE: Multidetector CT imaging of the chest, abdomen and pelvis was performed following the standard protocol during bolus administration of intravenous contrast. RADIATION DOSE REDUCTION: This exam was performed according to the departmental dose-optimization program which includes automated exposure control, adjustment of the mA and/or kV according to patient size and/or use of iterative reconstruction technique. CONTRAST:  90mL OMNIPAQUE  IOHEXOL  300 MG/ML  SOLN COMPARISON:  Multiple priors including most recent CT November 25, 2022 FINDINGS: CT CHEST  FINDINGS Cardiovascular: Right chest Port-A-Cath with tip at the superior cavoatrial junction. Normal caliber thoracic aorta. No central pulmonary embolus on this nondedicated study. Normal size heart. No significant pericardial effusion/thickening Mediastinum/Nodes: Similar appearance of the confluence soft tissue attenuation in the right hilar region compatible with treatment change. No new suspicious enhancing nodularity within the posttreatment change. Similar mild symmetric wall thickening of the esophagus possibly related to prior treatment/therapy adjacent prominent lymph node is unchanged measuring up to 8 mm in short axis on image 41/2 possibly reactive. No pathologically enlarged mediastinal, hilar or axillary lymph nodes. Lungs/Pleura: Similar dense banded and confluence opacification extending from the right hilum with a associated traction bronchiectasis and architectural distortion compatible with posttreatment change. No new suspicious nodularity in this area. No new suspicious pulmonary nodules or masses. Centrilobular and paraseptal emphysema. Biapical pleuroparenchymal scarring. No pleural effusion or pneumothorax. Musculoskeletal: Probable postradiation change in the midthoracic spine. No aggressive lytic or blastic lesion of bone. CT ABDOMEN PELVIS FINDINGS Hepatobiliary: No suspicious hepatic lesion. Gallbladder surgically absent. Similar biliary ductal dilation with the common duct measuring 8 mm in the pancreatic head. Pancreas: Similar prominence of the main pancreatic duct from head through body of pancreas. No discrete pancreatic mass lesion identified. No evidence of acute inflammation. Spleen: No splenomegaly or focal splenic lesion. Adrenals/Urinary Tract: No suspicious adrenal nodule/mass. No hydronephrosis. Stable 12 mm right renal cyst on image 65/2 considered benign and requiring no independent imaging follow-up. Kidneys demonstrate symmetric enhancement. Urinary bladder is  unremarkable for degree of distension. Stomach/Bowel: Radiopaque enteric contrast material traverses distal loops of small bowel. Wall thickening versus underdistention of the gastric antrum. No pathologic dilation of small or large bowel. Moderate volume of formed stool in the colon. No evidence of acute bowel inflammation. Vascular/Lymphatic: Aortic atherosclerosis. Normal caliber abdominal aorta. Smooth IVC contours. No pathologically enlarged abdominal or pelvic lymph nodes. Reproductive: Status post hysterectomy. No adnexal masses. Other: No significant abdominopelvic free fluid. Musculoskeletal: No suspicious lytic or blastic lesion of bone. Multilevel degenerative changes spine. IMPRESSION: 1. Similar posttreatment change in the right hilar region without evidence of local recurrence. 2. No evidence of metastatic disease in the chest, abdomen or pelvis. 3. Similar mild symmetric wall thickening of the esophagus possibly related to prior treatment/therapy adjacent prominent lymph node is unchanged measuring up to 8 mm in short axis is also stable and possibly reactive. 4. Wall thickening versus underdistention of the gastric antrum, consider correlation with endoscopy. 5. Similar biliary ductal dilation and distension of the  main pancreatic duct from head through body of pancreas. No discrete pancreatic mass lesion identified. Consider continued attention on follow-up imaging. 6. Moderate volume of formed stool in the colon. Correlate for constipation. 7. Aortic Atherosclerosis (ICD10-I70.0) and Emphysema (ICD10-J43.9). Electronically Signed   By: Reyes Holder M.D.   On: 02/03/2023 14:27    ASSESSMENT AND PLAN: This is a very pleasant 58 years old white female with stage IIIB (T1b, N3, M0) non-small cell lung cancer, adenocarcinoma diagnosed in November 2022 with no actionable mutation and negative PD-L1 expression. The patient completed a course of concurrent chemoradiation with weekly carboplatin  for  AUC of 2 and paclitaxel  45 Mg/M2 status post 7 cycles.  She has been tolerating her treatment well except for the mild odynophagia and skin burns. She was also recently admitted to the hospital complaining of dysphagia and odynophagia secondary to radiation induced esophagitis.  She is feeling much better but continues to have residual dysphagia.  She is followed by gastroenterology and was seen by Dr. Teressa during her hospitalization. Her scan showed improvement of her disease. I recommended for the patient treatment with consolidation immunotherapy with Imfinzi  1500 Mg IV every 4 weeks.  Status post 3 cycles.  Last dose was given in April 2023.  Her treatment was discontinued secondary to suspicious immunotherapy mediated pneumonitis with significant shortness of breath at that time and she was treated with a tapered dose of prednisone . Unfortunately her scan showed evidence for disease recurrence with enlargement of lower right cervical lymph nodes as well as mediastinal lymphadenopathy.  She has palpable right cervical lymphadenopathy. 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 The patient underwent ultrasound-guided core biopsy of the right supraclavicular lymph nodes but the final pathology showed necrotic tumor cells with complete coagulative necrosis with focal fibrous tissue and histiocytic reaction.  She also had MRI of the brain that showed no evidence of metastatic disease to the brain. The patient was seen by Dr. Shannon and started palliative radiotherapy to the enlarging right supraclavicular lymphadenopathy.  This was completed on October 28, 2021. The patient  was found to have metastatic disease in February 2024 with several lymphadenopathy in the chest as well as supraclavicular, upper abdomen as well as suspicious small liver metastasis. 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  I will send the tissue biopsy to [facility] for molecular studies. She is currently undergoing systemic chemotherapy with carboplatin  for AUC of 5, Alimta  500 Mg/M2 and Keytruda  200 Mg IV every 3 weeks status post 14 cycles.  Starting from cycle #3, I reduced her dose of carboplatin  to AUC of 4 and Alimta  400 Mg/M2.  Starting from cycle #5 the patient will be on maintenance treatment with Alimta  and Keytruda  every 3 weeks. She has been tolerating this treatment well with no concerning adverse effects. She had repeat CT scan of the neck, chest, abdomen and pelvis.  I personally and independently reviewed the scan and discussed the result with the patient today.  Her scan showed improvement of the neck lymphadenopathy and stable disease otherwise.  I recommended for the patient to continue her  current treatment.     Adenocarcinoma of the Lung Stage IV adenocarcinoma of the lung diagnosed in November 2022. Currently undergoing chemotherapy with carboplatin , pemetrexed , and pembrolizumab . Completed 14 cycles; here for the 15th cycle. Recent scans show stable disease with shrinking lymph nodes and no progression. Reports mild stomach pain likely due to stool softeners. No nausea, vomiting, diarrhea, chest pain, or shortness of breath. Persistent dry cough in the mornings. No weight loss, fever, or chills. Informed consent obtained for continued chemotherapy, discussing risks (nausea, fatigue, potential progression) and benefits (disease stabilization, tumor shrinkage). Alternatives include other chemotherapy  regimens or palliative care. Prefers current regimen due to positive response and stable disease. - Administer 15th cycle of chemotherapy with pemetrexed  and pembrolizumab  - Continue current chemotherapy regimen every three weeks - Monitor for side effects and disease progression - Schedule follow-up appointment in three weeks  Dry Cough Persistent dry cough, primarily in the mornings. Currently taking benzonatate , which provides relief. No associated fever, chills, or weight loss. - Continue benzonatate  as needed for cough  Chest Tightness Intermittent chest tightness reported. No associated shortness of breath or chest pain. Likely related to underlying lung condition and treatment. - Monitor symptoms and report any worsening or new symptoms  General Health Maintenance No fever, chills, or weight loss. Enjoyed recent holiday season and expecting a new grandchild. - Encourage continued monitoring of general health and well-being - Provide emotional support and encouragement regarding family news  Follow-up - Schedule follow-up appointment in three weeks.   The patient was advised to call immediately if she has any concerning symptoms in the interval. The patient voices understanding of current disease status and treatment options and is in agreement with the current care plan.  All questions were answered. The patient knows to call the clinic with any problems, questions or concerns. We can certainly see the patient much sooner if necessary.  The total time spent in the appointment was 30 minutes.  Disclaimer: This note was dictated with voice recognition software. Similar sounding words can inadvertently be transcribed and may not be corrected upon review.

## 2023-02-09 NOTE — Patient Instructions (Signed)
 [facility] - A DEPT OF [facility]  Discharge Instructions: Thank you for choosing [facility] to provide your oncology and hematology care.   If you have a lab appointment with the Cancer Center, please go directly to the Cancer Center and check in at the registration area.   Wear comfortable clothing and clothing appropriate for easy access to any Portacath or PICC line.   We strive to give you quality time with your provider. You may need to reschedule your appointment if you arrive late (15 or more minutes).  Arriving late affects you and other patients whose appointments are after yours.  Also, if you miss three or more appointments without notifying the office, you may be dismissed from the clinic at the provider's discretion.      For prescription refill requests, have your pharmacy contact our office and allow 72 hours for refills to be completed.    Today you received the following chemotherapy and/or immunotherapy agents: Keytruda, Alimta.       To help prevent nausea and vomiting after your treatment, we encourage you to take your nausea medication as directed.  BELOW ARE SYMPTOMS THAT SHOULD BE REPORTED IMMEDIATELY: *FEVER GREATER THAN 100.4 F (38 C) OR HIGHER *CHILLS OR SWEATING *NAUSEA AND VOMITING THAT IS NOT CONTROLLED WITH YOUR NAUSEA MEDICATION *UNUSUAL SHORTNESS OF BREATH *UNUSUAL BRUISING OR BLEEDING *URINARY PROBLEMS (pain or burning when urinating, or frequent urination) *BOWEL PROBLEMS (unusual diarrhea, constipation, pain near the anus) TENDERNESS IN MOUTH AND THROAT WITH OR WITHOUT PRESENCE OF ULCERS (sore throat, sores in mouth, or a toothache) UNUSUAL RASH, SWELLING OR PAIN  UNUSUAL VAGINAL DISCHARGE OR ITCHING   Items with * indicate a potential emergency and should be followed up as soon as possible or go to the Emergency Department if any problems should occur.  Please show the CHEMOTHERAPY ALERT CARD or  IMMUNOTHERAPY ALERT CARD at check-in to the Emergency Department and triage nurse.  Should you have questions after your visit or need to cancel or reschedule your appointment, please contact [facility] - A DEPT OF [facility]  Dept: [phone]  and follow the prompts.  Office hours are 8:00 a.m. to 4:30 p.m. Monday - Friday. Please note that voicemails left after 4:00 p.m. may not be returned until the following business day.  We are closed weekends and major holidays. You have access to a nurse at all times for urgent questions. Please call the main number to the clinic Dept: [phone] and follow the prompts.   For any non-urgent questions, you may also contact your provider using MyChart. We now offer e-Visits for anyone 61 and older to request care online for non-urgent symptoms. For details visit [email or web address].   Also download the MyChart app! Go to the app store, search "MyChart", open the app, select , and log in with your MyChart username and password.

## 2023-02-09 NOTE — Progress Notes (Signed)
  Radiation Oncology         [phone] ________________________________  Name: Julie Nicholson MRN: [id number]  Date: 02/10/2023  DOB: [birth year]  End of Treatment Note  Diagnosis: The primary encounter diagnosis was Secondary malignant neoplasm of cervical lymph node (HCC). A diagnosis of Adenocarcinoma of right lung, stage 4 (HCC) was also pertinent to this visit.   Recurrent locally advanced non-small cell lung cancer initially diagnosed as stage IIIB  (T1b, N3, M0) non-small cell lung cancer, adenocarcinoma she presented with right upper lobe nodule in addition to bulky right hilar, mediastinal, and left supraclavicular lymphadenopathy diagnosed in November 2022.  The patient had evidence for disease recurrence in the mediastinal and right supraclavicular lymphadenopathy in August 2023. The patient had evidence of metastatic disease in February 2024 with several supraclavicular, thoracic and precarinal lymphadenopathy as well as upper abdominal lymph nodes and small liver lesion.      Indication for treatment: Palliative        Radiation treatment dates: First Treatment Date: 2022-12-23 - Last Treatment Date: 2023-01-07  Site/Dose/Technique/Mode:   Site: Sclav-LT Technique: IMRT Mode: Photon Dose Per Fraction: 3 Gy Prescribed Dose (Delivered / Prescribed): 30 Gy / 30 Gy Prescribed Fxs (Delivered / Prescribed): 10 / 10  Narrative: The patient tolerated radiation treatment relatively well. During her final weekly treatment check on 01/04/23, the patient endorsed mild fatigue and denied any other symptoms. On examination, the persistent lymph node in the left supraclavicular area was again palpated. This was somewhat smaller than on prior exams but still persistent. She was also noted to have some erythema in the radiation field. No skin breakdown was appreciated.   Plan: The patient has completed radiation treatment. The patient will return to radiation oncology clinic for routine  followup in one month. I advised them to call or return sooner if they have any questions or concerns related to their recovery or treatment.  -----------------------------------  Lynwood CHARM Nasuti, PhD, MD  This document serves as a record of services personally performed by Lynwood Nasuti, MD. It was created on his behalf by Dorthy Fuse, a trained medical scribe. The creation of this record is based on the scribe's personal observations and the provider's statements to them. This document has been checked and approved by the attending provider.

## 2023-02-09 NOTE — Progress Notes (Signed)
 Radiation Oncology         [phone] ________________________________  Name: Julie Nicholson MRN: [id number]  Date: 02/10/2023  DOB: 22-Mar-1965  Follow-Up Visit Note  CC: Cristopher Suzen HERO, NP  Sherrod Sherrod, MD    ICD-10-CM   1. Adenocarcinoma of right lung, stage 4 (HCC)  C34.91       Diagnosis: Recurrent locally advanced non-small cell lung cancer initially diagnosed as stage IIIB  (T1b, N3, M0) non-small cell lung cancer, adenocarcinoma she presented with right upper lobe nodule in addition to bulky right hilar, mediastinal, and left supraclavicular lymphadenopathy diagnosed in November 2022.  The patient had evidence for disease recurrence in the mediastinal and right supraclavicular lymphadenopathy in August 2023. The patient had evidence of metastatic disease in February 2024 with several supraclavicular, thoracic and precarinal lymphadenopathy as well as upper abdominal lymph nodes and small liver lesion.      Interval Since Last Radiation: 1 month and 3 days   Indication for treatment: Palliative         Radiation treatment dates: First Treatment Date: 2022-12-23 - Last Treatment Date: 2023-01-07   Site/Dose/Technique/Mode:    Site: Sclav-LT Technique: IMRT Mode: Photon Dose Per Fraction: 3 Gy Prescribed Dose (Delivered / Prescribed): 30 Gy / 30 Gy Prescribed Fxs (Delivered / Prescribed): 10 / 10  Narrative:  The patient returns today for routine follow-up. She tolerated radiation treatment relatively well. During her final weekly treatment check on 01/04/23, the patient endorsed mild fatigue and denied any other symptoms. On examination, the persistent lymph node in the left supraclavicular area was again palpated. This was somewhat smaller than on prior exams but still persistent. She was also noted to have some erythema in the radiation field. No skin breakdown was appreciated.   Since completing radiation therapy (and while undergoing radiation therapy) she has  continued to receive maintenance immunotherapy consisting of Alimta  and Keytruda  q3 weeks under Dr. Sherrod. During her most recent visit with Dr. Sherrod yesterday (01/08), she was noted to report a recent onset of abdominal discomfort, which she attributes to possibly overusing stool softeners. She also endorsed having a persistent dry cough which she manages with Tessalon  capsules, and some intermittent chest tightness which is likely related to her underlying condition and treatment. She was otherwise noted to be tolerating her maintenance therapy well, and received her 15th cycle yesterday.   Pertinent imaging performed in the interval since she started radiation therapy includes:  -- CT CAP with contrast on 02/03/23 which demonstrated: similar mild symmetric wall thickening of the esophagus that is possibly related to prior treatment/therapy, stability of the adjacent prominent lymph node measuring up to 8 mm in the short axis; stable posttreatment change in the right hilar region without evidence of local recurrence; and no evidence of metastatic disease in the chest, abdomen, or pelvis.   -- Soft tissue neck CT with contrast on 02/03/23 demonstrated an interval decrease in size of the necrotic left level V and supraclavicular lymph nodes (now sub-centimeter in size). No new evidence of metastatic disease in the neck was demonstrated overall.   Patient reports to be doing well overall today. She denies any skin irritation, lymphedema, issues with range of motion, or left upper extremity neuropathy. She notes some fatigue which is consistent with her baseline.   Allergies:  is allergic to carboplatin .  Meds: Current Outpatient Medications  Medication Sig Dispense Refill   acetaminophen  (TYLENOL ) 500 MG tablet Take 1,000 mg by mouth every 6 (six) hours  as needed for moderate pain.     albuterol  (PROVENTIL ) (2.5 MG/3ML) 0.083% nebulizer solution albuterol  sulfate 2.5 mg/3 mL (0.083 %) solution for  nebulization  USE 1 VIAL IN NEBULIZER EVERY 6 HOURS AS NEEDED FOR WHEEZING OR SHORTNESS OF BREATH     albuterol  (VENTOLIN  HFA) 108 (90 Base) MCG/ACT inhaler Inhale 1-2 puffs into the lungs every 6 (six) hours as needed for wheezing or shortness of breath. 18 g 3   aspirin  EC 81 MG tablet Take 81 mg by mouth at bedtime.     atorvastatin  (LIPITOR) 40 MG tablet Take 40 mg by mouth at bedtime.     azelastine  (ASTELIN ) 0.1 % nasal spray Place 1 spray into both nostrils 2 (two) times daily as needed. Use in each nostril as directed 30 mL 5   benzonatate  (TESSALON ) 200 MG capsule Take 1 capsule (200 mg total) by mouth 3 (three) times daily as needed. 30 capsule 0   Biotin 5000 MCG TABS Take 5,000 mcg by mouth at bedtime.     buPROPion  (WELLBUTRIN  XL) 150 MG 24 hr tablet Take 150 mg by mouth every morning.     Ca Carbonate-Mag Hydroxide (ROLAIDS PO) Take 1 tablet by mouth daily as needed (heartburn).     cetirizine  (ZYRTEC  ALLERGY ) 10 MG tablet Take 1 tablet (10 mg total) by mouth daily as needed for rhinitis. 30 tablet 5   doxycycline  (VIBRAMYCIN ) 100 MG capsule Take 100 mg by mouth 2 (two) times daily.     famotidine  (PEPCID ) 20 MG tablet Take 1 tablet by mouth twice daily 60 tablet 0   fluticasone  (FLONASE ) 50 MCG/ACT nasal spray Place 2 sprays into both nostrils daily. 16 g 5   folic acid  (FOLVITE ) 1 MG tablet Take 1 tablet by mouth once daily 30 tablet 0   gabapentin (NEURONTIN) 300 MG capsule Take 300 mg by mouth.     hydrocortisone  1 % ointment Apply 1 Application topically 2 (two) times daily. 30 g 0   lidocaine -prilocaine  (EMLA ) cream APPLY TO PORT-A-CATH 30 TO 60 MINUTES BEFORE TREATMENT 30 g 0   linaCLOtide (LINZESS PO) Take by mouth.     Multiple Vitamins-Minerals (HAIR SKIN AND NAILS FORMULA PO) Take 1 tablet by mouth daily.     ondansetron  (ZOFRAN -ODT) 8 MG disintegrating tablet DISSOLVE 1 TABLET IN MOUTH THREE TIMES DAILY AS NEEDED 20 tablet 0   Oxycodone  HCl 10 MG TABS Take 1 tablet by  mouth 3 (three) times daily as needed (pain).     PARoxetine (PAXIL) 10 MG tablet Take 10 mg by mouth daily.     prochlorperazine  (COMPAZINE ) 10 MG tablet Take 1 tablet (10 mg total) by mouth every 6 (six) hours as needed. 30 tablet 2   rizatriptan (MAXALT-MLT) 10 MG disintegrating tablet Take 10 mg by mouth as needed for migraine. May repeat in 2 hours if needed     sertraline  (ZOLOFT ) 50 MG tablet Take 1 tablet by mouth daily.     Tiotropium Bromide-Olodaterol (STIOLTO RESPIMAT ) 2.5-2.5 MCG/ACT AERS Inhale 2 puffs into the lungs daily. 12 g 3   No current facility-administered medications for this encounter.    Physical Findings: The patient is in no acute distress. Patient is alert and oriented.  weight is 106 lb 12.8 oz (48.4 kg). Her oral temperature is 98.1 F (36.7 C). Her blood pressure is 123/75 and her pulse is 93. Her respiration is 18 and oxygen saturation is 100%. .  No significant changes. Lungs are clear to auscultation bilaterally.  Heart has regular rate and rhythm. No palpable cervical, supraclavicular, or axillary adenopathy. Abdomen soft, non-tender, normal bowel sounds. Satisfactory skin healing within the treatment fields.    Lab Findings: Lab Results  Component Value Date   WBC 3.5 (L) 02/09/2023   HGB 11.3 (L) 02/09/2023   HCT 31.9 (L) 02/09/2023   MCV 102.6 (H) 02/09/2023   PLT 189 02/09/2023    Radiographic Findings: CT Soft Tissue Neck W Contrast Result Date: 02/08/2023 CLINICAL DATA:  Neck mass, nonpulsatile. Non-small cell lung cancer. EXAM: CT NECK WITH CONTRAST TECHNIQUE: Multidetector CT imaging of the neck was performed using the standard protocol following the bolus administration of intravenous contrast. RADIATION DOSE REDUCTION: This exam was performed according to the departmental dose-optimization program which includes automated exposure control, adjustment of the mA and/or kV according to patient size and/or use of iterative reconstruction technique.  CONTRAST:  90mL OMNIPAQUE  IOHEXOL  300 MG/ML  SOLN COMPARISON:  Neck CT 11/25/2022 FINDINGS: Pharynx and larynx: No evidence of a mass or swelling. Widely patent airway. No fluid collection or inflammatory changes in the parapharyngeal or retropharyngeal spaces. Salivary glands: No inflammation, mass, or stone. Thyroid : Unremarkable. Lymph nodes: Necrotic left level V and left supraclavicular lymph nodes on the prior study have decreased in size and are now all subcentimeter in short axis (for example, residual small nodes on series 2, images 45 and 56). Scattered subcentimeter short axis lymph nodes elsewhere in the neck bilaterally are unchanged. No new cervical lymphadenopathy is evident. Vascular: Major vascular structures of the neck are patent. Right IJ Port-A-Cath. Limited intracranial: Unremarkable. Visualized orbits: Unremarkable. Mastoids and visualized paranasal sinuses: Mucosal thickening and mucous retention cysts in the maxillary sinuses. No significant mastoid fluid. Skeleton: No suspicious bone lesion. Multilevel disc degeneration, most advanced at C4-5. Upper chest: Reported separately on the contemporaneous chest CT. Other: None. IMPRESSION: Decreased size of necrotic left level V and supraclavicular lymph nodes, now subcentimeter. No new evidence of metastatic disease in the neck. Electronically Signed   By: Dasie Hamburg M.D.   On: 02/08/2023 14:56   CT CHEST ABDOMEN PELVIS W CONTRAST Result Date: 02/03/2023 CLINICAL DATA:  History of stage IV adenocarcinoma of the right lung, restaging. * Tracking Code: BO * EXAM: CT CHEST, ABDOMEN, AND PELVIS WITH CONTRAST TECHNIQUE: Multidetector CT imaging of the chest, abdomen and pelvis was performed following the standard protocol during bolus administration of intravenous contrast. RADIATION DOSE REDUCTION: This exam was performed according to the departmental dose-optimization program which includes automated exposure control, adjustment of the mA and/or  kV according to patient size and/or use of iterative reconstruction technique. CONTRAST:  90mL OMNIPAQUE  IOHEXOL  300 MG/ML  SOLN COMPARISON:  Multiple priors including most recent CT November 25, 2022 FINDINGS: CT CHEST FINDINGS Cardiovascular: Right chest Port-A-Cath with tip at the superior cavoatrial junction. Normal caliber thoracic aorta. No central pulmonary embolus on this nondedicated study. Normal size heart. No significant pericardial effusion/thickening Mediastinum/Nodes: Similar appearance of the confluence soft tissue attenuation in the right hilar region compatible with treatment change. No new suspicious enhancing nodularity within the posttreatment change. Similar mild symmetric wall thickening of the esophagus possibly related to prior treatment/therapy adjacent prominent lymph node is unchanged measuring up to 8 mm in short axis on image 41/2 possibly reactive. No pathologically enlarged mediastinal, hilar or axillary lymph nodes. Lungs/Pleura: Similar dense banded and confluence opacification extending from the right hilum with a associated traction bronchiectasis and architectural distortion compatible with posttreatment change. No new suspicious nodularity in this  area. No new suspicious pulmonary nodules or masses. Centrilobular and paraseptal emphysema. Biapical pleuroparenchymal scarring. No pleural effusion or pneumothorax. Musculoskeletal: Probable postradiation change in the midthoracic spine. No aggressive lytic or blastic lesion of bone. CT ABDOMEN PELVIS FINDINGS Hepatobiliary: No suspicious hepatic lesion. Gallbladder surgically absent. Similar biliary ductal dilation with the common duct measuring 8 mm in the pancreatic head. Pancreas: Similar prominence of the main pancreatic duct from head through body of pancreas. No discrete pancreatic mass lesion identified. No evidence of acute inflammation. Spleen: No splenomegaly or focal splenic lesion. Adrenals/Urinary Tract: No suspicious  adrenal nodule/mass. No hydronephrosis. Stable 12 mm right renal cyst on image 65/2 considered benign and requiring no independent imaging follow-up. Kidneys demonstrate symmetric enhancement. Urinary bladder is unremarkable for degree of distension. Stomach/Bowel: Radiopaque enteric contrast material traverses distal loops of small bowel. Wall thickening versus underdistention of the gastric antrum. No pathologic dilation of small or large bowel. Moderate volume of formed stool in the colon. No evidence of acute bowel inflammation. Vascular/Lymphatic: Aortic atherosclerosis. Normal caliber abdominal aorta. Smooth IVC contours. No pathologically enlarged abdominal or pelvic lymph nodes. Reproductive: Status post hysterectomy. No adnexal masses. Other: No significant abdominopelvic free fluid. Musculoskeletal: No suspicious lytic or blastic lesion of bone. Multilevel degenerative changes spine. IMPRESSION: 1. Similar posttreatment change in the right hilar region without evidence of local recurrence. 2. No evidence of metastatic disease in the chest, abdomen or pelvis. 3. Similar mild symmetric wall thickening of the esophagus possibly related to prior treatment/therapy adjacent prominent lymph node is unchanged measuring up to 8 mm in short axis is also stable and possibly reactive. 4. Wall thickening versus underdistention of the gastric antrum, consider correlation with endoscopy. 5. Similar biliary ductal dilation and distension of the main pancreatic duct from head through body of pancreas. No discrete pancreatic mass lesion identified. Consider continued attention on follow-up imaging. 6. Moderate volume of formed stool in the colon. Correlate for constipation. 7. Aortic Atherosclerosis (ICD10-I70.0) and Emphysema (ICD10-J43.9). Electronically Signed   By: Reyes Holder M.D.   On: 02/03/2023 14:27    Impression:  Recurrent locally advanced non-small cell lung cancer initially diagnosed as stage IIIB  (T1b,  N3, M0) non-small cell lung cancer, adenocarcinoma she presented with right upper lobe nodule in addition to bulky right hilar, mediastinal, and left supraclavicular lymphadenopathy diagnosed in November 2022.  The patient had evidence for disease recurrence in the mediastinal and right supraclavicular lymphadenopathy in August 2023. The patient had evidence of metastatic disease in February 2024 with several supraclavicular, thoracic and precarinal lymphadenopathy as well as upper abdominal lymph nodes and small liver lesion.      The patient is doing well and has recovered from the effects of her radiation treatment. Most recent CT scan demonstrates favorable response to the radiation.  Plan:  Patient will continue follow-up and systemic therapy under the care of Dr. Sherrod. She is scheduled to see him on 03/01/2022 with an her 16th chemo infusion to follow. Radiation follow-up PRN. We appreciate the opportunity to partake in this patient's care. She knows to call with any questions or concerns.    20 minutes of total time was spent for this patient encounter, including preparation, face-to-face counseling with the patient and coordination of care, physical exam, and documentation of the encounter. ____________________________________   Leeroy Due, PA-C  This document serves as a record of services personally performed by Leeroy Due, PA-C. It was created on his behalf by Dorthy Fuse, a trained medical scribe.  The creation of this record is based on the scribe's personal observations and the provider's statements to them. This document has been checked and approved by the attending provider.

## 2023-02-10 ENCOUNTER — Encounter: Payer: Self-pay | Admitting: Radiation Oncology

## 2023-02-10 ENCOUNTER — Ambulatory Visit
Admission: RE | Admit: 2023-02-10 | Discharge: 2023-02-10 | Disposition: A | Discharge status: Home / Self Care | Payer: Commercial Managed Care - PPO | Source: Ambulatory Visit | Attending: Radiation Oncology | Admitting: Radiation Oncology

## 2023-02-10 VITALS — BP 123/75 | HR 93 | Temp 98.1°F | Resp 18 | Wt 106.8 lb

## 2023-02-10 DIAGNOSIS — C3491 Malignant neoplasm of unspecified part of right bronchus or lung: Secondary | ICD-10-CM

## 2023-02-10 LAB — T4: T4, Total: 9.7 ug/dL (ref 4.5–12.0)

## 2023-02-10 NOTE — Progress Notes (Signed)
 Julie Nicholson is here today for follow up post radiation to left supraclavicular.     Does the patient complain of any of the following: Pain: Denies pain to treatment field. Reports having a headache.  Shortness of breath w/wo exertion: No Cough: Yes dry cough mostly in the morning.  Pain with swallowing: Good Appetite: Good Energy Level: fair Post radiation skin Changes: No    Additional comments if applicable:   BP 123/75 (BP Location: Right Arm, Patient Position: Sitting)   Pulse 93   Temp 98.1 F (36.7 C) (Oral)   Resp 18   Wt 106 lb 12.8 oz (48.4 kg)   SpO2 100%   BMI 19.53 kg/m

## 2023-02-11 ENCOUNTER — Ambulatory Visit: Payer: Commercial Managed Care - PPO | Admitting: Internal Medicine

## 2023-02-15 ENCOUNTER — Telehealth: Payer: Self-pay | Admitting: Medical Oncology

## 2023-02-15 ENCOUNTER — Other Ambulatory Visit: Payer: Self-pay

## 2023-02-15 NOTE — Telephone Encounter (Addendum)
 4-5 episodes of blowing nose  producing BRB clots . Has facial pain and headache. Seeing PCP tomorrow for possible sinus infection. . Today nasal drainage  is yellow to clear .

## 2023-02-16 ENCOUNTER — Encounter: Payer: Self-pay | Admitting: Internal Medicine

## 2023-02-25 ENCOUNTER — Other Ambulatory Visit: Payer: Self-pay

## 2023-03-02 ENCOUNTER — Inpatient Hospital Stay: Payer: Commercial Managed Care - PPO

## 2023-03-02 ENCOUNTER — Inpatient Hospital Stay (HOSPITAL_BASED_OUTPATIENT_CLINIC_OR_DEPARTMENT_OTHER): Payer: Commercial Managed Care - PPO | Admitting: Internal Medicine

## 2023-03-02 ENCOUNTER — Encounter: Payer: Self-pay | Admitting: Internal Medicine

## 2023-03-02 ENCOUNTER — Other Ambulatory Visit: Payer: Self-pay | Admitting: Physician Assistant

## 2023-03-02 ENCOUNTER — Other Ambulatory Visit: Payer: Self-pay

## 2023-03-02 VITALS — BP 112/63 | HR 91 | Temp 98.0°F | Resp 15 | Ht 62.0 in | Wt 107.5 lb

## 2023-03-02 DIAGNOSIS — Z95828 Presence of other vascular implants and grafts: Secondary | ICD-10-CM

## 2023-03-02 DIAGNOSIS — C3491 Malignant neoplasm of unspecified part of right bronchus or lung: Secondary | ICD-10-CM

## 2023-03-02 DIAGNOSIS — C3411 Malignant neoplasm of upper lobe, right bronchus or lung: Secondary | ICD-10-CM

## 2023-03-02 DIAGNOSIS — D701 Agranulocytosis secondary to cancer chemotherapy: Secondary | ICD-10-CM

## 2023-03-02 DIAGNOSIS — Z5112 Encounter for antineoplastic immunotherapy: Secondary | ICD-10-CM | POA: Diagnosis not present

## 2023-03-02 LAB — CBC WITH DIFFERENTIAL (CANCER CENTER ONLY)
Abs Immature Granulocytes: 0.01 10*3/uL (ref 0.00–0.07)
Basophils Absolute: 0 10*3/uL (ref 0.0–0.1)
Basophils Relative: 0 %
Eosinophils Absolute: 0.3 10*3/uL (ref 0.0–0.5)
Eosinophils Relative: 8 %
HCT: 30.2 % — ABNORMAL LOW (ref 36.0–46.0)
Hemoglobin: 10.6 g/dL — ABNORMAL LOW (ref 12.0–15.0)
Immature Granulocytes: 0 %
Lymphocytes Relative: 20 %
Lymphs Abs: 0.6 10*3/uL — ABNORMAL LOW (ref 0.7–4.0)
MCH: 36.1 pg — ABNORMAL HIGH (ref 26.0–34.0)
MCHC: 35.1 g/dL (ref 30.0–36.0)
MCV: 102.7 fL — ABNORMAL HIGH (ref 80.0–100.0)
Monocytes Absolute: 0.4 10*3/uL (ref 0.1–1.0)
Monocytes Relative: 13 %
Neutro Abs: 1.9 10*3/uL (ref 1.7–7.7)
Neutrophils Relative %: 59 %
Platelet Count: 164 10*3/uL (ref 150–400)
RBC: 2.94 MIL/uL — ABNORMAL LOW (ref 3.87–5.11)
RDW: 14.9 % (ref 11.5–15.5)
WBC Count: 3.2 10*3/uL — ABNORMAL LOW (ref 4.0–10.5)
nRBC: 0 % (ref 0.0–0.2)

## 2023-03-02 LAB — CMP (CANCER CENTER ONLY)
ALT: 22 U/L (ref 0–44)
AST: 22 U/L (ref 15–41)
Albumin: 3.7 g/dL (ref 3.5–5.0)
Alkaline Phosphatase: 110 U/L (ref 38–126)
Anion gap: 4 — ABNORMAL LOW (ref 5–15)
BUN: 11 mg/dL (ref 6–20)
CO2: 28 mmol/L (ref 22–32)
Calcium: 9.3 mg/dL (ref 8.9–10.3)
Chloride: 110 mmol/L (ref 98–111)
Creatinine: 0.87 mg/dL (ref 0.44–1.00)
GFR, Estimated: >60 mL/min (ref >60)
Glucose, Bld: 90 mg/dL (ref 70–99)
Potassium: 3.8 mmol/L (ref 3.5–5.1)
Sodium: 142 mmol/L (ref 135–145)
Total Bilirubin: 0.4 mg/dL (ref 0.0–1.2)
Total Protein: 6.8 g/dL (ref 6.5–8.1)

## 2023-03-02 MED ORDER — SODIUM CHLORIDE 0.9 % IV SOLN: Freq: Once | INTRAVENOUS | Status: AC

## 2023-03-02 MED ORDER — SODIUM CHLORIDE 0.9% FLUSH
10.0000 mL | Freq: Once | INTRAVENOUS | Status: AC
  Administered 2023-03-02: 10 mL

## 2023-03-02 MED ORDER — SODIUM CHLORIDE 0.9 % IV SOLN
200.0000 mg | Freq: Once | INTRAVENOUS | Status: AC
  Administered 2023-03-02: 200 mg via INTRAVENOUS
  Filled 2023-03-02: qty 200

## 2023-03-02 MED ORDER — SODIUM CHLORIDE 0.9 % IV SOLN
400.0000 mg/m2 | Freq: Once | INTRAVENOUS | Status: AC
  Administered 2023-03-02: 600 mg via INTRAVENOUS
  Filled 2023-03-02: qty 20

## 2023-03-02 MED ORDER — ONDANSETRON HCL 4 MG/2ML IJ SOLN
8.0000 mg | Freq: Once | INTRAMUSCULAR | Status: AC
Start: 2023-03-02 — End: 2023-03-02
  Administered 2023-03-02: 8 mg via INTRAVENOUS
  Filled 2023-03-02: qty 4

## 2023-03-02 NOTE — Progress Notes (Signed)
[facility] [phone]   [phone]  OFFICE PROGRESS NOTE  Julie Nicholson, Fringer, NP [address] [place] [address]  DIAGNOSIS: Recurrent/metastatic non-small cell lung cancer initially diagnosed as stage IIIB  (T1b, N3, M0) non-small cell lung cancer, adenocarcinoma she presented with right upper lobe nodule in addition to bulky right hilar, mediastinal, and left supraclavicular lymphadenopathy diagnosed in November 2022.  The patient had evidence for disease recurrence in the mediastinal and right supraclavicular lymphadenopathy in August 2023.  The patient had evidence of metastatic disease in February 2024 with several supraclavicular, thoracic and precarinal lymphadenopathy as well as upper abdominal lymph nodes and small liver lesion.  DETECTED ALTERATION(S) / BIOMARKER(S) % CFDNA OR AMPLIFICATION ASSOCIATED FDA-APPROVED THERAPIES CLINICAL TRIAL AVAILABILITY [id number] ND 0.5 5 50 100 4.7%  RHOAG17E ND 0.5 5 50 100 1.8%  [id number] ND 0.5 5 50 100 1.9%  BIOMARKER ADDITIONAL DETAILS Tumor Mutational Burden (TMB) 19.02 mut/Mb MSI Status Stable (MSS) PD-L1 Tumor Proportion Score (TPS)* <1%  Biomarker Findings By [facility] on 05/06/2022 Tumor Mutational Burden - 17 Muts/Mb Microsatellite status - MS-Stable Genomic Findings For a complete list of the genes assayed, please refer to the Appendix. CTNNB1 S37C CRKL amplification MAP2K4 loss exons 2-11 TP53 V143A 8 Disease relevant genes with no reportable alterations: ALK, BRAF, EGFR, ERBB2, KRAS, MET, RET, ROS1  PDL1 0%   PRIOR THERAPY:  1) Concurrent chemoradiation with carboplatin for an AUC of 2 and paclitaxel 45 mg per metered squared.  First dose on 01/05/2021.  Status post 7 cycles of treatment.  Last dose was given February 16, 2021. 2) Consolidation immunotherapy with Imfinzi 1500 Mg IV every 4 weeks.  First dose March 25, 2021.  Status post 3 cycles.  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 3) Palliative radiotherapy to the enlarging right supraclavicular lymphadenopathy under the care of Dr. Roselind Messier expected to be completed on October 28, 2021.   CURRENT THERAPY: Palliative systemic chemotherapy with carboplatin for AUC of 5, Alimta 500 Mg/M2 and Keytruda 200 Mg IV every 3 weeks.  First dose April 05, 2022.  Status post 14 cycles.  Starting from cycle #3 her dose of carboplatin was reduced to AUC of 4 and Alimta 400 Mg/M2.  Starting from cycle #5 the patient will be on maintenance treatment with Alimta and Keytruda every 3 weeks.  INTERVAL HISTORY: Julie Nicholson 58 y.o. female returns to the clinic today for follow-up visit. Discussed the use of AI scribe software for clinical note transcription with the patient, who gave verbal consent to proceed.  History of Present Illness   The patient is a 58 year old female with metastatic lung adenocarcinoma who presents for chemotherapy treatment.  She was initially diagnosed with stage 3B lung adenocarcinoma in November 2022 and received chemoradiation. In August 2023 and February 2024, she developed metastatic disease, leading to a change in her treatment regimen. She was started on chemotherapy with an initial four cycles of carboplatin, Alimta, and Keytruda. She is currently on maintenance therapy with Alimta and Keytruda every three weeks and has completed fifteen cycles.  Since her last visit three weeks ago, she has been experiencing a 'bad headache', which she attributes to lack of sleep due to caregiving responsibilities for her four-year-old granddaughter, who is hospitalized with shingles and herpes affecting her eyes. No chest pain, shortness of breath, nausea, vomiting, or diarrhea, although she experiences occasional nausea for which she has medication available.  She is the primary caregiver for her four-year-old granddaughter, who is currently  hospitalized.       MEDICAL HISTORY: Past Medical History:  Diagnosis Date   Abdominal discomfort    Cancer (HCC)    Chronic headaches    due to allergies, sinus   COPD (chronic obstructive pulmonary disease) (HCC)    per 2012 chest xray   pt states she doesn not have this now (04/10/2013)   Deviated nasal septum    Eustachian tube dysfunction    GERD (gastroesophageal reflux disease)    occasional uses Tums / Rolaids   Hearing loss    right ear   High cholesterol    History of radiation therapy    right lung 01/07/2021-02/19/2021  Dr Antony Blackbird   History of radiation therapy    Lymph nodes of head,face,neck- 12/23/22-01/07/23- Dr. Antony Blackbird   Migraine    "only once in a blue moon since RX'd allergy shots" (04/10/2013)   Pancreatitis 02/08/2013   Pneumonia    Rhinitis, allergic     ALLERGIES:  is allergic to carboplatin.  MEDICATIONS:  Current Outpatient Medications  Medication Sig Dispense Refill   acetaminophen (TYLENOL) 500 MG tablet Take 1,000 mg by mouth every 6 (six) hours as needed for moderate pain.     albuterol (PROVENTIL) (2.5 MG/3ML) 0.083% nebulizer solution albuterol sulfate 2.5 mg/3 mL (0.083 %) solution for nebulization  USE 1 VIAL IN NEBULIZER EVERY 6 HOURS AS NEEDED FOR WHEEZING OR SHORTNESS OF BREATH     albuterol (VENTOLIN HFA) 108 (90 Base) MCG/ACT inhaler Inhale 1-2 puffs into the lungs every 6 (six) hours as needed for wheezing or shortness of breath. 18 g 3   aspirin EC 81 MG tablet Take 81 mg by mouth at bedtime.     atorvastatin (LIPITOR) 40 MG tablet Take 40 mg by mouth at bedtime.     azelastine (ASTELIN) 0.1 % nasal spray Place 1 spray into both nostrils 2 (two) times daily as needed. Use in each nostril as directed 30 mL 5   benzonatate (TESSALON) 200 MG capsule Take 1 capsule (200 mg total) by mouth 3 (three) times daily as needed. 30 capsule 0   Biotin 5000 MCG TABS Take 5,000 mcg by mouth at bedtime.     buPROPion (WELLBUTRIN XL) 150 MG  24 hr tablet Take 150 mg by mouth every morning.     Ca Carbonate-Mag Hydroxide (ROLAIDS PO) Take 1 tablet by mouth daily as needed (heartburn).     cetirizine (ZYRTEC ALLERGY) 10 MG tablet Take 1 tablet (10 mg total) by mouth daily as needed for rhinitis. 30 tablet 5   doxycycline (VIBRAMYCIN) 100 MG capsule Take 100 mg by mouth 2 (two) times daily.     famotidine (PEPCID) 20 MG tablet Take 1 tablet by mouth twice daily 60 tablet 0   fluticasone (FLONASE) 50 MCG/ACT nasal spray Place 2 sprays into both nostrils daily. 16 g 5   folic acid (FOLVITE) 1 MG tablet Take 1 tablet by mouth once daily 30 tablet 0   gabapentin (NEURONTIN) 300 MG capsule Take 300 mg by mouth.     hydrocortisone 1 % ointment Apply 1 Application topically 2 (two) times daily. 30 g 0   lidocaine-prilocaine (EMLA) cream APPLY TO PORT-A-CATH 30 TO 60 MINUTES BEFORE TREATMENT 30 g 0   linaCLOtide (LINZESS PO) Take by mouth.     Multiple Vitamins-Minerals (HAIR SKIN AND NAILS FORMULA PO) Take 1 tablet by mouth daily.  ondansetron (ZOFRAN-ODT) 8 MG disintegrating tablet DISSOLVE 1 TABLET IN MOUTH THREE TIMES DAILY AS NEEDED 20 tablet 0   Oxycodone HCl 10 MG TABS Take 1 tablet by mouth 3 (three) times daily as needed (pain).     PARoxetine (PAXIL) 10 MG tablet Take 10 mg by mouth daily.     prochlorperazine (COMPAZINE) 10 MG tablet Take 1 tablet (10 mg total) by mouth every 6 (six) hours as needed. 30 tablet 2   rizatriptan (MAXALT-MLT) 10 MG disintegrating tablet Take 10 mg by mouth as needed for migraine. May repeat in 2 hours if needed     sertraline (ZOLOFT) 50 MG tablet Take 1 tablet by mouth daily.     Tiotropium Bromide-Olodaterol (STIOLTO RESPIMAT) 2.5-2.5 MCG/ACT AERS Inhale 2 puffs into the lungs daily. 12 g 3   No current facility-administered medications for this visit.    SURGICAL HISTORY:  Past Surgical History:  Procedure Laterality Date   ABDOMINAL HYSTERECTOMY  1995   tx endometriosis, both ovaries  removed   ADENOIDECTOMY     APPENDECTOMY  late 1990's   BIOPSY  02/26/2021   Procedure: BIOPSY;  Surgeon: Rachael Fee, MD;  Location: [facility]COPY;  Service: Endoscopy;;   BRONCHIAL BRUSHINGS  12/15/2020   Procedure: BRONCHIAL BRUSHINGS;  Surgeon: Leslye Peer, MD;  Location: [facility] ENDOSCOPY;  Service: Cardiopulmonary;;   BRONCHIAL NEEDLE ASPIRATION BIOPSY  12/15/2020   Procedure: BRONCHIAL NEEDLE ASPIRATION BIOPSIES;  Surgeon: Leslye Peer, MD;  Location: [facility] ENDOSCOPY;  Service: Cardiopulmonary;;   CHOLECYSTECTOMY  04/10/2013   CHOLECYSTECTOMY N/A 04/10/2013   Procedure: LAPAROSCOPIC CHOLECYSTECTOMY WITH INTRAOPERATIVE CHOLANGIOGRAM;  Surgeon: Adolph Pollack, MD;  Location: [facility] OR;  Service: General;  Laterality: N/A;   ELECTROMAGNETIC NAVIGATION BROCHOSCOPY  12/15/2020   Procedure: ELECTROMAGNETIC NAVIGATION BRONCHOSCOPY;  Surgeon: Leslye Peer, MD;  Location: [facility] ENDOSCOPY;  Service: Cardiopulmonary;;   ESOPHAGOGASTRODUODENOSCOPY (EGD) WITH PROPOFOL N/A 02/26/2021   Procedure: ESOPHAGOGASTRODUODENOSCOPY (EGD) WITH PROPOFOL;  Surgeon: Rachael Fee, MD;  Location: [facility] ENDOSCOPY;  Service: Endoscopy;  Laterality: N/A;   EUS N/A 02/16/2013   Procedure: UPPER ENDOSCOPIC ULTRASOUND (EUS) LINEAR;  Surgeon: Rachael Fee, MD;  Location: [facility] END[facility];  Service: Endoscopy;  Laterality: N/A;  radial linear   IR IMAGING GUIDED PORT INSERTION  06/30/2022   KNEE ARTHROSCOPY Right 1980's   "cartilage OR"   LAPAROSCOPIC ENDOMETRIOSIS FULGURATION  1980's   MYRINGOTOMY WITH TUBE PLACEMENT Right 07/13/2018   Procedure: MYRINGOTOMY WITH TUBE PLACEMENT;  Surgeon: Vernie Murders, MD;  Location: [facility];  Service: ENT;  Laterality: Right;   MYRINGOTOMY WITH TUBE PLACEMENT Right 06/04/2021   Procedure: MYRINGOTOMY WITH BUTTERFLY TUBE PLACEMENT;  Surgeon: Vernie Murders, MD;  Location: [facility];  Service: ENT;  Laterality: Right;   NASOPHARYNGOSCOPY EUSTATION TUBE BALLOON  DILATION Right 07/13/2018   Procedure: NASOPHARYNGOSCOPY EUSTATION TUBE BALLOON DILATION;  Surgeon: Vernie Murders, MD;  Location: [facility];  Service: ENT;  Laterality: Right;   TONSILLECTOMY AND ADENOIDECTOMY  ~ 1980   adenoidectomy   TUBAL LIGATION  ~ 1987   TURBINATE REDUCTION Right 07/13/2018   Procedure: OUTFRACTURE TURBINATE;  Surgeon: Vernie Murders, MD;  Location: [facility];  Service: ENT;  Laterality: Right;   TYMPANOSTOMY TUBE PLACEMENT     VIDEO BRONCHOSCOPY WITH ENDOBRONCHIAL ULTRASOUND N/A 12/15/2020   Procedure: ROBOTIC VIDEO BRONCHOSCOPY WITH ENDOBRONCHIAL ULTRASOUND;  Surgeon: Leslye Peer, MD;  Location: [facility] ENDOSCOPY;  Service: Cardiopulmonary;  Laterality: N/A;   WRIST SURGERY Left  w/plate    REVIEW OF SYSTEMS:  A comprehensive review of systems was negative except for: Constitutional: positive for fatigue Neurological: positive for headaches   PHYSICAL EXAMINATION: General appearance: alert, cooperative, fatigued, and no distress Head: Normocephalic, without obvious abnormality, atraumatic Neck: moderate anterior cervical adenopathy, no JVD, supple, symmetrical, trachea midline, and thyroid not enlarged, symmetric, no tenderness/mass/nodules Lymph nodes: Cervical, supraclavicular, and axillary nodes normal. Resp: clear to auscultation bilaterally Back: symmetric, no curvature. ROM normal. No CVA tenderness. Cardio: regular rate and rhythm, S1, S2 normal, no murmur, click, rub or gallop GI: soft, non-tender; bowel sounds normal; no masses,  no organomegaly Extremities: extremities normal, atraumatic, no cyanosis or edema  ECOG PERFORMANCE STATUS: 1 - Symptomatic but completely ambulatory  Blood pressure 112/63, pulse 91, temperature 98 F (36.7 C), temperature source Temporal, resp. rate 15, height 5\' 2"  (1.575 m), weight 107 lb 8 oz (48.8 kg), SpO2 99%.  LABORATORY DATA: Lab Results  Component Value Date   WBC 3.2 (L) 03/02/2023   HGB  10.6 (L) 03/02/2023   HCT 30.2 (L) 03/02/2023   MCV 102.7 (H) 03/02/2023   PLT 164 03/02/2023      Chemistry      Component Value Date/Time   NA 141 02/09/2023 1022   K 4.0 02/09/2023 1022   CL 107 02/09/2023 1022   CO2 29 02/09/2023 1022   BUN 7 02/09/2023 1022   CREATININE 0.84 02/09/2023 1022      Component Value Date/Time   CALCIUM 9.4 02/09/2023 1022   ALKPHOS 123 02/09/2023 1022   AST 25 02/09/2023 1022   ALT 20 02/09/2023 1022   BILITOT 0.5 02/09/2023 1022       RADIOGRAPHIC STUDIES: CT Soft Tissue Neck W Contrast Result Date: 02/08/2023 CLINICAL DATA:  Neck mass, nonpulsatile. Non-small cell lung cancer. EXAM: CT NECK WITH CONTRAST TECHNIQUE: Multidetector CT imaging of the neck was performed using the standard protocol following the bolus administration of intravenous contrast. RADIATION DOSE REDUCTION: This exam was performed according to the departmental dose-optimization program which includes automated exposure control, adjustment of the mA and/or kV according to patient size and/or use of iterative reconstruction technique. CONTRAST:  90mL OMNIPAQUE IOHEXOL 300 MG/ML  SOLN COMPARISON:  Neck CT 11/25/2022 FINDINGS: Pharynx and larynx: No evidence of a mass or swelling. Widely patent airway. No fluid collection or inflammatory changes in the parapharyngeal or retropharyngeal spaces. Salivary glands: No inflammation, mass, or stone. Thyroid: Unremarkable. Lymph nodes: Necrotic left level V and left supraclavicular lymph nodes on the prior study have decreased in size and are now all subcentimeter in short axis (for example, residual small nodes on series 2, images 45 and 56). Scattered subcentimeter short axis lymph nodes elsewhere in the neck bilaterally are unchanged. No new cervical lymphadenopathy is evident. Vascular: Major vascular structures of the neck are patent. Right IJ Port-A-Cath. Limited intracranial: Unremarkable. Visualized orbits: Unremarkable. Mastoids and  visualized paranasal sinuses: Mucosal thickening and mucous retention cysts in the maxillary sinuses. No significant mastoid fluid. Skeleton: No suspicious bone lesion. Multilevel disc degeneration, most advanced at C4-5. Upper chest: Reported separately on the contemporaneous chest CT. Other: None. IMPRESSION: Decreased size of necrotic left level V and supraclavicular lymph nodes, now subcentimeter. No new evidence of metastatic disease in the neck. Electronically Signed   By: Sebastian Ache M.D.   On: 02/08/2023 14:56   CT CHEST ABDOMEN PELVIS W CONTRAST Result Date: 02/03/2023 CLINICAL DATA:  History of stage IV adenocarcinoma of the right lung, restaging. *  Tracking Code: BO * EXAM: CT CHEST, ABDOMEN, AND PELVIS WITH CONTRAST TECHNIQUE: Multidetector CT imaging of the chest, abdomen and pelvis was performed following the standard protocol during bolus administration of intravenous contrast. RADIATION DOSE REDUCTION: This exam was performed according to the departmental dose-optimization program which includes automated exposure control, adjustment of the mA and/or kV according to patient size and/or use of iterative reconstruction technique. CONTRAST:  90mL OMNIPAQUE IOHEXOL 300 MG/ML  SOLN COMPARISON:  Multiple priors including most recent CT November 25, 2022 FINDINGS: CT CHEST FINDINGS Cardiovascular: Right chest Port-A-Cath with tip at the superior cavoatrial junction. Normal caliber thoracic aorta. No central pulmonary embolus on this nondedicated study. Normal size heart. No significant pericardial effusion/thickening Mediastinum/Nodes: Similar appearance of the confluence soft tissue attenuation in the right hilar region compatible with treatment change. No new suspicious enhancing nodularity within the posttreatment change. Similar mild symmetric wall thickening of the esophagus possibly related to prior treatment/therapy adjacent prominent lymph node is unchanged measuring up to 8 mm in short axis on  image 41/2 possibly reactive. No pathologically enlarged mediastinal, hilar or axillary lymph nodes. Lungs/Pleura: Similar dense banded and confluence opacification extending from the right hilum with a associated traction bronchiectasis and architectural distortion compatible with posttreatment change. No new suspicious nodularity in this area. No new suspicious pulmonary nodules or masses. Centrilobular and paraseptal emphysema. Biapical pleuroparenchymal scarring. No pleural effusion or pneumothorax. Musculoskeletal: Probable postradiation change in the midthoracic spine. No aggressive lytic or blastic lesion of bone. CT ABDOMEN PELVIS FINDINGS Hepatobiliary: No suspicious hepatic lesion. Gallbladder surgically absent. Similar biliary ductal dilation with the common duct measuring 8 mm in the pancreatic head. Pancreas: Similar prominence of the main pancreatic duct from head through body of pancreas. No discrete pancreatic mass lesion identified. No evidence of acute inflammation. Spleen: No splenomegaly or focal splenic lesion. Adrenals/Urinary Tract: No suspicious adrenal nodule/mass. No hydronephrosis. Stable 12 mm right renal cyst on image 65/2 considered benign and requiring no independent imaging follow-up. Kidneys demonstrate symmetric enhancement. Urinary bladder is unremarkable for degree of distension. Stomach/Bowel: Radiopaque enteric contrast material traverses distal loops of small bowel. Wall thickening versus underdistention of the gastric antrum. No pathologic dilation of small or large bowel. Moderate volume of formed stool in the colon. No evidence of acute bowel inflammation. Vascular/Lymphatic: Aortic atherosclerosis. Normal caliber abdominal aorta. Smooth IVC contours. No pathologically enlarged abdominal or pelvic lymph nodes. Reproductive: Status post hysterectomy. No adnexal masses. Other: No significant abdominopelvic free fluid. Musculoskeletal: No suspicious lytic or blastic lesion of  bone. Multilevel degenerative changes spine. IMPRESSION: 1. Similar posttreatment change in the right hilar region without evidence of local recurrence. 2. No evidence of metastatic disease in the chest, abdomen or pelvis. 3. Similar mild symmetric wall thickening of the esophagus possibly related to prior treatment/therapy adjacent prominent lymph node is unchanged measuring up to 8 mm in short axis is also stable and possibly reactive. 4. Wall thickening versus underdistention of the gastric antrum, consider correlation with endoscopy. 5. Similar biliary ductal dilation and distension of the main pancreatic duct from head through body of pancreas. No discrete pancreatic mass lesion identified. Consider continued attention on follow-up imaging. 6. Moderate volume of formed stool in the colon. Correlate for constipation. 7. Aortic Atherosclerosis (ICD10-I70.0) and Emphysema (ICD10-J43.9). Electronically Signed   By: Maudry Mayhew M.D.   On: 02/03/2023 14:27    ASSESSMENT AND PLAN: This is a very pleasant 58 years old white female with stage IIIB (T1b, N3, M0) non-small cell  lung cancer, adenocarcinoma diagnosed in November 2022 with no actionable mutation and negative PD-L1 expression. The patient completed a course of concurrent chemoradiation with weekly carboplatin for AUC of 2 and paclitaxel 45 Mg/M2 status post 7 cycles.  She has been tolerating her treatment well except for the mild odynophagia and skin burns. She was also recently admitted to the hospital complaining of dysphagia and odynophagia secondary to radiation induced esophagitis.  She is feeling much better but continues to have residual dysphagia.  She is followed by gastroenterology and was seen by Dr. Christella Hartigan during her hospitalization. Her scan showed improvement of her disease. I recommended for the patient treatment with consolidation immunotherapy with Imfinzi 1500 Mg IV every 4 weeks.  Status post 3 cycles.  Last dose was given in  April 2023.  Her treatment was discontinued secondary to suspicious immunotherapy mediated pneumonitis with significant shortness of breath at that time and she was treated with a tapered dose of prednisone. Unfortunately her scan showed evidence for disease recurrence with enlargement of lower right cervical lymph nodes as well as mediastinal lymphadenopathy.  She has palpable right cervical lymphadenopathy. 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 The patient underwent ultrasound-guided core biopsy of the right supraclavicular lymph nodes but the final pathology showed necrotic tumor cells with complete coagulative necrosis with focal fibrous tissue and histiocytic reaction.  She also had MRI of the brain that showed no evidence of metastatic disease to the brain. The patient was seen by Dr. Roselind Messier and started palliative radiotherapy to the enlarging right supraclavicular lymphadenopathy.  This was completed on October 28, 2021. The patient was found to have metastatic disease in February 2024 with several lymphadenopathy in the chest as well as supraclavicular, upper abdomen as well as suspicious small liver metastasis. 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  I will send the tissue biopsy to [facility] for molecular studies. She is currently undergoing systemic chemotherapy with carboplatin for AUC of 5, Alimta 500 Mg/M2 and Keytruda 200 Mg IV every 3 weeks status post 15 cycles.  Starting  from cycle #3, I reduced her dose of carboplatin to AUC of 4 and Alimta 400 Mg/M2.  Starting from cycle #5 the patient will be on maintenance treatment with Alimta and Keytruda every 3 weeks. She has been tolerating the treatment well with no concerning adverse effects.  Metastatic Lung Adenocarcinoma Stage IIIB lung adenocarcinoma diagnosed in November 2022. Initial chemoradiation followed by evidence of metastasis in August 2023 and February 2024. Currently on maintenance therapy with Alimta and Keytruda every three weeks, with 15 cycles completed. Blood work supports continuation. Discussed ongoing treatment necessity and potential side effects, including managed nausea. - Administer cycle 16 of Alimta and Keytruda today - Schedule follow-up in three weeks  Headache Headache likely due to stress and sleep deprivation from caregiving. No associated cold, flu symptoms, chest pain, shortness of breath, or significant nausea. Advised rest and self-care. - Advise rest and self-care - Ensure access to nausea medication  General Health Maintenance Caregiver for a child with shingles and herpes. Advised on infection prevention measures. -  Advise wearing gloves and taking precautions to avoid infection.  The patient was advised to call immediately if she has any other concerning symptoms in the interval. The patient voices understanding of current disease status and treatment options and is in agreement with the current care plan.  All questions were answered. The patient knows to call the clinic with any problems, questions or concerns. We can certainly see the patient much sooner if necessary.  The total time spent in the appointment was 20 minutes.  Disclaimer: This note was dictated with voice recognition software. Similar sounding words can inadvertently be transcribed and may not be corrected upon review.

## 2023-03-07 ENCOUNTER — Telehealth: Payer: Self-pay | Admitting: Internal Medicine

## 2023-03-08 ENCOUNTER — Other Ambulatory Visit: Payer: Self-pay

## 2023-03-09 ENCOUNTER — Encounter: Payer: Self-pay | Admitting: Internal Medicine

## 2023-03-09 ENCOUNTER — Other Ambulatory Visit: Payer: Self-pay | Admitting: Emergency Medicine

## 2023-03-09 NOTE — Telephone Encounter (Signed)
 Julie Nicholson

## 2023-03-16 NOTE — Progress Notes (Signed)
[facility] OFFICE PROGRESS NOTE  Roux, Brandy, NP [address] [place] [address]  DIAGNOSIS: Recurrent/metastatic non-small cell lung cancer initially diagnosed as stage IIIB  (T1b, N3, M0) non-small cell lung cancer, adenocarcinoma she presented with right upper lobe nodule in addition to bulky right hilar, mediastinal, and left supraclavicular lymphadenopathy diagnosed in November 2022.  The patient had evidence for disease recurrence in the mediastinal and right supraclavicular lymphadenopathy in August 2023.  The patient had evidence of metastatic disease in February 2024 with several supraclavicular, thoracic and precarinal lymphadenopathy as well as upper abdominal lymph nodes and small liver lesion.   DETECTED ALTERATION(S) / BIOMARKER(S)% CFDNA OR AMPLIFICATIONASSOCIATED FDA-APPROVED THERAPIESCLINICAL TRIAL AVAILABILITY [id number] ND 0.5 5 50 100 4.7%   RHOAG17E ND 0.5 5 50 100 1.8%   CTNNB1S37C ND 0.5 5 50 100 1.9%   BIOMARKERADDITIONAL DETAILS Tumor Mutational Burden (TMB) 19.02 mut/Mb MSI Status Stable (MSS) PD-L1 Tumor Proportion Score (TPS)* <1%   Biomarker Findings By [facility] on 05/06/2022 Tumor Mutational Burden - 17 Muts/Mb Microsatellite status - MS-Stable Genomic Findings For a complete list of the genes assayed, please refer to the Appendix. CTNNB1 S37C CRKL amplification MAP2K4 loss exons 2-11 TP53 V143A 8 Disease relevant genes with no reportable alterations: ALK, BRAF, EGFR, ERBB2, KRAS, MET, RET, ROS1   PDL1 0%  PRIOR THERAPY: 1) Concurrent chemoradiation with carboplatin for an AUC of 2 and paclitaxel 45 mg per metered squared.  First dose on 01/05/2021.  Status post 7 cycles of treatment.  Last dose was given February 16, 2021. 2) Consolidation immunotherapy with Imfinzi 1500 Mg IV every 4 weeks.  First dose March 25, 2021.  Status post 3 cycles.  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 3) Palliative radiotherapy to the enlarging right supraclavicular lymphadenopathy under the care of Dr. Roselind Messier expected to be completed on October 28, 2021. 4) Radiation to the enlarging left supraclavicular lymph node under the care of Dr. Roselind Messier. Last dose expected on 01/07/23   CURRENT THERAPY: Palliative systemic chemotherapy with carboplatin for AUC of 5, Alimta 500 Mg/M2 and Keytruda 200 Mg IV every 3 weeks. First dose April 05, 2022. Status post 16 cycles. Starting from cycle #3 her dose of carboplatin was reduced to AUC of 4 and Alimta 400 Mg/M2. Starting from cycle #5 the patient will be on maintenance treatment with Alimta and Keytruda every 3 weeks.   INTERVAL HISTORY: NEASIA FLEEMAN 58 y.o. female returns to the clinic today for a follow up visit. The patient was last seen 3 weeks ago by Dr. Arbutus Ped. She is currently undergoing chemotherapy and immunotherapy for her lung cancer. She tolerates it well.   She does have a dry cough that has been going on for about one week now. She occasionally coughs up clear mucus with this but is mostly dry. She has been taking tessalon pearls, but they have not improved her symptoms much. She states it is worse at night when she is laying down. She is having some lower abdominal soreness from coughing. She denies any associated symptoms congestion, fever, recent illness, or chills. She does have DOE which is normal for her due to her COPD and it is unchanged from her baseline. She has a history of pneumonitis when she was on Imfinzi in the last.   She did have the stomach bug last week, but it is better now. She was constipated and vomiting during this time as well. She reports her  energy is about the same. Her sinus headaches are at baseline. She takes tylenol. She states her appetite has been good.    She denies any chest pain or chest tightness. She denies any rashes or skin changes.  She is here today for evaluation repeat blood  work before undergoing cycle #17.     MEDICAL HISTORY: Past Medical History:  Diagnosis Date   Abdominal discomfort    Cancer (HCC)    Chronic headaches    due to allergies, sinus   COPD (chronic obstructive pulmonary disease) (HCC)    per 2012 chest xray   pt states she doesn not have this now (04/10/2013)   Deviated nasal septum    Eustachian tube dysfunction    GERD (gastroesophageal reflux disease)    occasional uses Tums / Rolaids   Hearing loss    right ear   High cholesterol    History of radiation therapy    right lung 01/07/2021-02/19/2021  Dr Antony Blackbird   History of radiation therapy    Lymph nodes of head,face,neck- 12/23/22-01/07/23- Dr. Antony Blackbird   Migraine    "only once in a blue moon since RX'd allergy shots" (04/10/2013)   Pancreatitis 02/08/2013   Pneumonia    Rhinitis, allergic     ALLERGIES:  is allergic to carboplatin.  MEDICATIONS:  Current Outpatient Medications  Medication Sig Dispense Refill   acetaminophen (TYLENOL) 500 MG tablet Take 1,000 mg by mouth every 6 (six) hours as needed for moderate pain.     albuterol (PROVENTIL) (2.5 MG/3ML) 0.083% nebulizer solution albuterol sulfate 2.5 mg/3 mL (0.083 %) solution for nebulization  USE 1 VIAL IN NEBULIZER EVERY 6 HOURS AS NEEDED FOR WHEEZING OR SHORTNESS OF BREATH     albuterol (VENTOLIN HFA) 108 (90 Base) MCG/ACT inhaler Inhale 1-2 puffs into the lungs every 6 (six) hours as needed for wheezing or shortness of breath. 18 g 3   aspirin EC 81 MG tablet Take 81 mg by mouth at bedtime.     atorvastatin (LIPITOR) 40 MG tablet Take 40 mg by mouth at bedtime.     azelastine (ASTELIN) 0.1 % nasal spray Place 1 spray into both nostrils 2 (two) times daily as needed. Use in each nostril as directed 30 mL 5   benzonatate (TESSALON) 200 MG capsule Take 1 capsule (200 mg total) by mouth 3 (three) times daily as needed. 30 capsule 0   Biotin 5000 MCG TABS Take 5,000 mcg by mouth at bedtime.     buPROPion  (WELLBUTRIN XL) 150 MG 24 hr tablet Take 150 mg by mouth every morning.     Ca Carbonate-Mag Hydroxide (ROLAIDS PO) Take 1 tablet by mouth daily as needed (heartburn).     cetirizine (ZYRTEC ALLERGY) 10 MG tablet Take 1 tablet (10 mg total) by mouth daily as needed for rhinitis. 30 tablet 5   doxycycline (VIBRAMYCIN) 100 MG capsule Take 100 mg by mouth 2 (two) times daily.     famotidine (PEPCID) 20 MG tablet Take 1 tablet by mouth twice daily 60 tablet 0   fluticasone (FLONASE) 50 MCG/ACT nasal spray Place 2 sprays into both nostrils daily. 16 g 5   folic acid (FOLVITE) 1 MG tablet Take 1 tablet by mouth once daily 30 tablet 0   gabapentin (NEURONTIN) 300 MG capsule Take 300 mg by mouth.     hydrocortisone 1 % ointment Apply 1 Application topically 2 (two) times daily. 30 g 0   lidocaine-prilocaine (EMLA) cream APPLY TO PORT-A-CATH  30 TO 60 MINUTES BEFORE TREATMENT 30 g 0   linaCLOtide (LINZESS PO) Take by mouth.     Multiple Vitamins-Minerals (HAIR SKIN AND NAILS FORMULA PO) Take 1 tablet by mouth daily.     ondansetron (ZOFRAN-ODT) 8 MG disintegrating tablet DISSOLVE 1 TABLET IN MOUTH THREE TIMES DAILY AS NEEDED 20 tablet 0   Oxycodone HCl 10 MG TABS Take 1 tablet by mouth 3 (three) times daily as needed (pain).     PARoxetine (PAXIL) 10 MG tablet Take 10 mg by mouth daily.     prochlorperazine (COMPAZINE) 10 MG tablet Take 1 tablet (10 mg total) by mouth every 6 (six) hours as needed. 30 tablet 2   rizatriptan (MAXALT-MLT) 10 MG disintegrating tablet Take 10 mg by mouth as needed for migraine. May repeat in 2 hours if needed     sertraline (ZOLOFT) 50 MG tablet Take 1 tablet by mouth daily.     Tiotropium Bromide-Olodaterol (STIOLTO RESPIMAT) 2.5-2.5 MCG/ACT AERS Inhale 2 puffs into the lungs daily. 12 g 3   No current facility-administered medications for this visit.    SURGICAL HISTORY:  Past Surgical History:  Procedure Laterality Date   ABDOMINAL HYSTERECTOMY  1995   tx  endometriosis, both ovaries removed   ADENOIDECTOMY     APPENDECTOMY  late 1990's   BIOPSY  02/26/2021   Procedure: BIOPSY;  Surgeon: Rachael Fee, MD;  Location: [facility];  Service: Endoscopy;;   BRONCHIAL BRUSHINGS  12/15/2020   Procedure: BRONCHIAL BRUSHINGS;  Surgeon: Leslye Peer, MD;  Location: [facility] ENDOSCOPY;  Service: Cardiopulmonary;;   BRONCHIAL NEEDLE ASPIRATION BIOPSY  12/15/2020   Procedure: BRONCHIAL NEEDLE ASPIRATION BIOPSIES;  Surgeon: Leslye Peer, MD;  Location: [facility];  Service: Cardiopulmonary;;   CHOLECYSTECTOMY  04/10/2013   CHOLECYSTECTOMY N/A 04/10/2013   Procedure: LAPAROSCOPIC CHOLECYSTECTOMY WITH INTRAOPERATIVE CHOLANGIOGRAM;  Surgeon: Adolph Pollack, MD;  Location: [facility] OR;  Service: General;  Laterality: N/A;   ELECTROMAGNETIC NAVIGATION BROCHOSCOPY  12/15/2020   Procedure: ELECTROMAGNETIC NAVIGATION BRONCHOSCOPY;  Surgeon: Leslye Peer, MD;  Location: [facility] ENDOSCOPY;  Service: Cardiopulmonary;;   ESOPHAGOGASTRODUODENOSCOPY (EGD) WITH PROPOFOL N/A 02/26/2021   Procedure: ESOPHAGOGASTRODUODENOSCOPY (EGD) WITH PROPOFOL;  Surgeon: Rachael Fee, MD;  Location: [facility];  Service: Endoscopy;  Laterality: N/A;   EUS N/A 02/16/2013   Procedure: UPPER ENDOSCOPIC ULTRASOUND (EUS) LINEAR;  Surgeon: Rachael Fee, MD;  Location: [facility];  Service: Endoscopy;  Laterality: N/A;  radial linear   IR IMAGING GUIDED PORT INSERTION  06/30/2022   KNEE ARTHROSCOPY Right 1980's   "cartilage OR"   LAPAROSCOPIC ENDOMETRIOSIS FULGURATION  1980's   MYRINGOTOMY WITH TUBE PLACEMENT Right 07/13/2018   Procedure: MYRINGOTOMY WITH TUBE PLACEMENT;  Surgeon: Vernie Murders, MD;  Location: [facility];  Service: ENT;  Laterality: Right;   MYRINGOTOMY WITH TUBE PLACEMENT Right 06/04/2021   Procedure: MYRINGOTOMY WITH BUTTERFLY TUBE PLACEMENT;  Surgeon: Vernie Murders, MD;  Location: [facility];  Service: ENT;  Laterality: Right;    NASOPHARYNGOSCOPY EUSTATION TUBE BALLOON DILATION Right 07/13/2018   Procedure: NASOPHARYNGOSCOPY EUSTATION TUBE BALLOON DILATION;  Surgeon: Vernie Murders, MD;  Location: [facility];  Service: ENT;  Laterality: Right;   TONSILLECTOMY AND ADENOIDECTOMY  ~ 1980   adenoidectomy   TUBAL LIGATION  ~ 1987   TURBINATE REDUCTION Right 07/13/2018   Procedure: OUTFRACTURE TURBINATE;  Surgeon: Vernie Murders, MD;  Location: [facility] SUR[facility]ERY [facility]TR;  Service: ENT;  Laterality: Right;   TYMPANOSTOMY TUBE PLACEMENT     VIDEO  BRONCHOSCOPY WITH ENDOBRONCHIAL ULTRASOUND N/A 12/15/2020   Procedure: ROBOTIC VIDEO BRONCHOSCOPY WITH ENDOBRONCHIAL ULTRASOUND;  Surgeon: Leslye Peer, MD;  Location: [facility] END[facility];  Service: Cardiopulmonary;  Laterality: N/A;   WRIST SURGERY Left    w/plate    REVIEW OF SYSTEMS:   Constitutional: Positive for fatigue. Negative for appetite change, chills, and unexpected weight change.  HENT: Negative for mouth sores, nosebleeds, sore throat and trouble swallowing.   Eyes: Negative for eye problems and icterus.  Respiratory: Positive for baseline dyspnea on exertion and worsening dry cough, especially at night. Negative for hemoptysis and wheezing.   Cardiovascular: Negative for chest pain and leg swelling.  Gastrointestinal: Negative for abdominal pain, nausea, constipation, diarrhea, and vomiting (resolved).  Genitourinary: Negative for bladder incontinence, difficulty urinating, dysuria, frequency and hematuria.   Musculoskeletal: Negative for back pain, gait problem, neck pain and neck stiffness.  Skin: Negative for itching and rash.  Neurological: Negative for dizziness, extremity weakness, gait problem, headaches, light-headedness and seizures.  Hematological: Positive for left supraclavicular adenopathy (improving with radiation). Does not bruise/bleed easily.  Psychiatric/Behavioral: Negative for confusion, depression and sleep disturbance. The patient is not  nervous/anxious.     PHYSICAL EXAMINATION:  There were no vitals taken for this visit.  ECOG PERFORMANCE STATUS: 1  Physical Exam  Constitutional: Oriented to person, place, and time and well-developed, well-nourished, and in no distress.  HENT:  Head: Normocephalic and atraumatic.  Mouth/Throat: Oropharynx is clear and moist. No oropharyngeal exudate.  Eyes: Conjunctivae are normal. Right eye exhibits no discharge. Left eye exhibits no discharge. No scleral icterus.  Neck: Normal range of motion. Neck supple.  Cardiovascular: Normal rate, regular rhythm, normal heart sounds and intact distal pulses.   Pulmonary/Chest: Effort normal and breath sounds normal. No respiratory distress. No wheezes. No rales.  Abdominal: Soft. Bowel sounds are normal. Exhibits no distension and no mass. There is no tenderness.  Musculoskeletal: Normal range of motion. Exhibits no edema.  Lymphadenopathy:    Improved left supraclavicular adenopathy.  Neurological: Alert and oriented to person, place, and time. Exhibits normal muscle tone. Gait normal. Coordination normal.  Skin: Skin is warm and dry. No rash noted. Not diaphoretic. No erythema. No pallor.  Psychiatric: Mood, memory and judgment normal.  Vitals reviewed.  LABORATORY DATA: Lab Results  Component Value Date   WBC 3.2 (L) 03/02/2023   HGB 10.6 (L) 03/02/2023   HCT 30.2 (L) 03/02/2023   MCV 102.7 (H) 03/02/2023   PLT 164 03/02/2023      Chemistry      Component Value Date/Time   NA 142 03/02/2023 0946   K 3.8 03/02/2023 0946   CL 110 03/02/2023 0946   CO2 28 03/02/2023 0946   BUN 11 03/02/2023 0946   CREATININE 0.87 03/02/2023 0946      Component Value Date/Time   CALCIUM 9.3 03/02/2023 0946   ALKPHOS 110 03/02/2023 0946   AST 22 03/02/2023 0946   ALT 22 03/02/2023 0946   BILITOT 0.4 03/02/2023 0946       RADIOGRAPHIC STUDIES:  No results found.   ASSESSMENT/PLAN:  This is a very pleasant 58 year old Caucasian  female with recurrent lung cancer initially diagnosed as stage IIIB (T1b, N3, M0) non-small cell lung cancer, adenocarcinoma.  She presented with a right upper lobe spiculated nodule and right hilar, mediastinal, and left supraclavicular adenopathy.  She was diagnosed in November 2022.   Therefore, the patient had guardant 360 performed which showed no actionable mutations.  Her PD-L1 expression is  less than 1%.  She was found to have metastatic disease in February 2024   The patient completed a course of concurrent chemoradiation with carboplatin for an AUC of 2 and paclitaxel 45 mg per metered square.  She status post 7 cycles.  She had some significant odynophagia and dysphagia from treatment.   She was seen by Dr. Christella Hartigan while admitted to the hospital and a EGD was performed which showed single 1cm oval shaped very shallow ulceration.  She recovered.   She then had consolidation immunotherapy with Imfinzi 1500 mg IV every 4 weeks.  She is status post 3 cycles.  She developed suspicious immunotherapy mediated pneumonitis and this was ultimately discontinued.   The patient was seen by Dr. Roselind Messier and started palliative radiotherapy to the enlarging right supraclavicular lymphadenopathy. This was completed on October 28, 2021.    The patient was found to have metastatic disease in February 2024 with several lymphadenopathy in the chest as well as supraclavicular, upper abdomen as well as suspicious small liver metastasis. 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    She is currently undergoing systemic chemotherapy with carboplatin for AUC of 5, Alimta 500 Mg/M2 and Keytruda 200 Mg IV every 3 weeks status post 16 cycles.  Starting from cycle #3, Dr. Arbutus Ped reduced her dose of carboplatin to AUC of 4 and Alimta 400 Mg/M2.  Starting from cycle #5 the patient started on maintenance treatment with Alimta and Keytruda every 3 weeks.    The most recent scan from November 2024 showed no evidence of disease progression except for progression of a single cluster of partially necrotic lymph nodes above the left clavicle.     She was seen by Dr. Roselind Messier who is arranging radiation. The last day of radiation was on 01/07/23 to the left clavicle lymph nodes.     Labs were reviewed.  I reviewed her symptoms with Dr. Arbutus Ped and her history of pneumonitis with Imfinzi.  We will hold her Keytruda today and she will proceed with single agent Alimta today.  I will send a Medrol Dosepak to the pharmacy for her cough should this be immunotherapy mediated in addition to cough medication with Hycodan and antibiotics should there be an infectious component overall she is well-appearing today and vitals are stable.  She is not in any respiratory distress and is nontoxic-appearing.  I will arrange for restaging CT of the neck, chest, abdomen, pelvis prior to her next cycle of treatment.  Should she have any new or worsening symptoms in the interval, strict return precautions were reviewed.    Recommend that she proceed with cycle #17 today scheduled.   We will see her back for follow-up visit in 3 weeks for evaluation repeat blood work before undergoing cycle number 16.  She was advised to not take her Hycodan with her prescription of oxycodone.  I also discussed side effects of steroids.  She was advised to take this first thing in the morning.    The patient was advised to call immediately if she has any concerning symptoms in the interval. The patient voices understanding of current disease status and treatment options and is in agreement with the current care plan. All questions were answered. The patient knows to call the clinic with any problems, questions or concerns. We can certainly see the patient much sooner if  necessary  No orders of the defined types were placed in this encounter.   The total time spent in the appointment was 30-39 minutes  Vaun Hyndman L Nikeia Henkes, PA-C 03/16/23

## 2023-03-20 ENCOUNTER — Other Ambulatory Visit: Payer: Self-pay

## 2023-03-23 ENCOUNTER — Other Ambulatory Visit: Payer: Self-pay | Admitting: Physician Assistant

## 2023-03-23 ENCOUNTER — Inpatient Hospital Stay: Payer: Commercial Managed Care - PPO

## 2023-03-23 ENCOUNTER — Inpatient Hospital Stay: Payer: Commercial Managed Care - PPO | Attending: Internal Medicine | Admitting: Physician Assistant

## 2023-03-23 VITALS — BP 109/63 | HR 95 | Temp 97.8°F | Resp 16 | Wt 105.6 lb

## 2023-03-23 DIAGNOSIS — Z5111 Encounter for antineoplastic chemotherapy: Secondary | ICD-10-CM | POA: Diagnosis not present

## 2023-03-23 DIAGNOSIS — T451X5A Adverse effect of antineoplastic and immunosuppressive drugs, initial encounter: Secondary | ICD-10-CM

## 2023-03-23 DIAGNOSIS — Z95828 Presence of other vascular implants and grafts: Secondary | ICD-10-CM

## 2023-03-23 DIAGNOSIS — R059 Cough, unspecified: Secondary | ICD-10-CM

## 2023-03-23 DIAGNOSIS — C3491 Malignant neoplasm of unspecified part of right bronchus or lung: Secondary | ICD-10-CM

## 2023-03-23 DIAGNOSIS — C3411 Malignant neoplasm of upper lobe, right bronchus or lung: Secondary | ICD-10-CM | POA: Diagnosis present

## 2023-03-23 LAB — CBC WITH DIFFERENTIAL (CANCER CENTER ONLY)
Abs Immature Granulocytes: 0.01 10*3/uL (ref 0.00–0.07)
Basophils Absolute: 0 10*3/uL (ref 0.0–0.1)
Basophils Relative: 0 %
Eosinophils Absolute: 0.1 10*3/uL (ref 0.0–0.5)
Eosinophils Relative: 3 %
HCT: 28.9 % — ABNORMAL LOW (ref 36.0–46.0)
Hemoglobin: 9.5 g/dL — ABNORMAL LOW (ref 12.0–15.0)
Immature Granulocytes: 0 %
Lymphocytes Relative: 16 %
Lymphs Abs: 0.6 10*3/uL — ABNORMAL LOW (ref 0.7–4.0)
MCH: 35.7 pg — ABNORMAL HIGH (ref 26.0–34.0)
MCHC: 32.9 g/dL (ref 30.0–36.0)
MCV: 108.6 fL — ABNORMAL HIGH (ref 80.0–100.0)
Monocytes Absolute: 0.4 10*3/uL (ref 0.1–1.0)
Monocytes Relative: 12 %
Neutro Abs: 2.4 10*3/uL (ref 1.7–7.7)
Neutrophils Relative %: 69 %
Platelet Count: 187 10*3/uL (ref 150–400)
RBC: 2.66 MIL/uL — ABNORMAL LOW (ref 3.87–5.11)
RDW: 14.7 % (ref 11.5–15.5)
WBC Count: 3.5 10*3/uL — ABNORMAL LOW (ref 4.0–10.5)
nRBC: 0 % (ref 0.0–0.2)

## 2023-03-23 LAB — CMP (CANCER CENTER ONLY)
ALT: 25 U/L (ref 0–44)
AST: 25 U/L (ref 15–41)
Albumin: 3.4 g/dL — ABNORMAL LOW (ref 3.5–5.0)
Alkaline Phosphatase: 153 U/L — ABNORMAL HIGH (ref 38–126)
Anion gap: 6 (ref 5–15)
BUN: 8 mg/dL (ref 6–20)
CO2: 30 mmol/L (ref 22–32)
Calcium: 9.1 mg/dL (ref 8.9–10.3)
Chloride: 106 mmol/L (ref 98–111)
Creatinine: 0.93 mg/dL (ref 0.44–1.00)
GFR, Estimated: >60 mL/min (ref >60)
Glucose, Bld: 88 mg/dL (ref 70–99)
Potassium: 3.9 mmol/L (ref 3.5–5.1)
Sodium: 142 mmol/L (ref 135–145)
Total Bilirubin: 0.5 mg/dL (ref 0.0–1.2)
Total Protein: 6.6 g/dL (ref 6.5–8.1)

## 2023-03-23 MED ORDER — PEMETREXED DISODIUM CHEMO INJECTION 500 MG
400.0000 mg/m2 | Freq: Once | INTRAVENOUS | Status: AC
  Administered 2023-03-23: 600 mg via INTRAVENOUS
  Filled 2023-03-23: qty 20

## 2023-03-23 MED ORDER — ZITHROMAX 1 G PO PACK: 1.0000 g | PACK | Freq: Once | ORAL | 0 refills | Status: DC

## 2023-03-23 MED ORDER — AZITHROMYCIN 250 MG PO TABS: ORAL_TABLET | ORAL | 0 refills | Status: DC

## 2023-03-23 MED ORDER — SODIUM CHLORIDE 0.9% FLUSH
10.0000 mL | INTRAVENOUS | Status: DC | PRN
  Administered 2023-03-23: 10 mL

## 2023-03-23 MED ORDER — ONDANSETRON HCL 4 MG/2ML IJ SOLN
8.0000 mg | Freq: Once | INTRAMUSCULAR | Status: AC
  Administered 2023-03-23: 8 mg via INTRAVENOUS
  Filled 2023-03-23: qty 4

## 2023-03-23 MED ORDER — METHYLPREDNISOLONE 4 MG PO TBPK: ORAL_TABLET | ORAL | 0 refills | Status: DC

## 2023-03-23 MED ORDER — SODIUM CHLORIDE 0.9% FLUSH
10.0000 mL | Freq: Once | INTRAVENOUS | Status: AC
Start: 2023-03-23 — End: 2023-03-23
  Administered 2023-03-23: 10 mL

## 2023-03-23 MED ORDER — SODIUM CHLORIDE 0.9 % IV SOLN: Freq: Once | INTRAVENOUS | Status: AC

## 2023-03-23 MED ORDER — HYDROCODONE BIT-HOMATROP MBR 5-1.5 MG/5ML PO SOLN: 5.0000 mL | Freq: Four times a day (QID) | ORAL | 0 refills | Status: DC | PRN

## 2023-03-23 MED ORDER — HEPARIN SOD (PORK) LOCK FLUSH 100 UNIT/ML IV SOLN
500.0000 [IU] | Freq: Once | INTRAVENOUS | Status: AC | PRN
  Administered 2023-03-23: 500 [IU]

## 2023-03-23 NOTE — Patient Instructions (Signed)
 [facility] - A DEPT OF [facility]  Discharge Instructions: Thank you for choosing [facility] to provide your oncology and hematology care.   If you have a lab appointment with the Cancer Center, please go directly to the Cancer Center and check in at the registration area.   Wear comfortable clothing and clothing appropriate for easy access to any Portacath or PICC line.   We strive to give you quality time with your provider. You may need to reschedule your appointment if you arrive late (15 or more minutes).  Arriving late affects you and other patients whose appointments are after yours.  Also, if you miss three or more appointments without notifying the office, you may be dismissed from the clinic at the provider's discretion.      For prescription refill requests, have your pharmacy contact our office and allow 72 hours for refills to be completed.    Today you received the following chemotherapy and/or immunotherapy agents Alimta      To help prevent nausea and vomiting after your treatment, we encourage you to take your nausea medication as directed.  BELOW ARE SYMPTOMS THAT SHOULD BE REPORTED IMMEDIATELY: *FEVER GREATER THAN 100.4 F (38 C) OR HIGHER *CHILLS OR SWEATING *NAUSEA AND VOMITING THAT IS NOT CONTROLLED WITH YOUR NAUSEA MEDICATION *UNUSUAL SHORTNESS OF BREATH *UNUSUAL BRUISING OR BLEEDING *URINARY PROBLEMS (pain or burning when urinating, or frequent urination) *BOWEL PROBLEMS (unusual diarrhea, constipation, pain near the anus) TENDERNESS IN MOUTH AND THROAT WITH OR WITHOUT PRESENCE OF ULCERS (sore throat, sores in mouth, or a toothache) UNUSUAL RASH, SWELLING OR PAIN  UNUSUAL VAGINAL DISCHARGE OR ITCHING   Items with * indicate a potential emergency and should be followed up as soon as possible or go to the Emergency Department if any problems should occur.  Please show the CHEMOTHERAPY ALERT CARD or IMMUNOTHERAPY  ALERT CARD at check-in to the Emergency Department and triage nurse.  Should you have questions after your visit or need to cancel or reschedule your appointment, please contact [facility] - A DEPT OF [facility]  Dept: [phone]  and follow the prompts.  Office hours are 8:00 a.m. to 4:30 p.m. Monday - Friday. Please note that voicemails left after 4:00 p.m. may not be returned until the following business day.  We are closed weekends and major holidays. You have access to a nurse at all times for urgent questions. Please call the main number to the clinic Dept: [phone] and follow the prompts.   For any non-urgent questions, you may also contact your provider using MyChart. We now offer e-Visits for anyone 79 and older to request care online for non-urgent symptoms. For details visit [email or web address].   Also download the MyChart app! Go to the app store, search "MyChart", open the app, select [place], and log in with your MyChart username and password.

## 2023-03-26 NOTE — Progress Notes (Signed)
 Updated Pemetrexed to new ERX: [id number]  Pryor Ochoa, PharmD 03/26/23 @ 0830

## 2023-03-27 ENCOUNTER — Other Ambulatory Visit: Payer: Self-pay

## 2023-03-29 ENCOUNTER — Other Ambulatory Visit: Payer: Self-pay | Admitting: Physician Assistant

## 2023-03-29 DIAGNOSIS — C3411 Malignant neoplasm of upper lobe, right bronchus or lung: Secondary | ICD-10-CM

## 2023-03-31 ENCOUNTER — Telehealth: Payer: Self-pay | Admitting: Medical Oncology

## 2023-03-31 NOTE — Telephone Encounter (Signed)
 Right side of  neck is getting sore- . She noticed it a few days ago. Same side as port a cath Denies swelling, redness  at site. I instructed the to apply a warm cloth to the area.  CT C/A/P scheduled for one week.  Requesting a CT/ultrasound of  neck ?

## 2023-04-01 ENCOUNTER — Other Ambulatory Visit: Payer: Self-pay

## 2023-04-07 ENCOUNTER — Ambulatory Visit (HOSPITAL_COMMUNITY)
Admission: RE | Admit: 2023-04-07 | Discharge: 2023-04-07 | Disposition: A | Discharge status: Home / Self Care | Payer: Commercial Managed Care - PPO | Source: Ambulatory Visit | Attending: Physician Assistant | Admitting: Physician Assistant

## 2023-04-07 DIAGNOSIS — R059 Cough, unspecified: Secondary | ICD-10-CM | POA: Diagnosis present

## 2023-04-07 DIAGNOSIS — C3491 Malignant neoplasm of unspecified part of right bronchus or lung: Secondary | ICD-10-CM | POA: Insufficient documentation

## 2023-04-07 MED ORDER — IOHEXOL 300 MG/ML  SOLN
100.0000 mL | Freq: Once | INTRAMUSCULAR | Status: AC | PRN
  Administered 2023-04-07: 100 mL via INTRAVENOUS

## 2023-04-07 MED ORDER — IOHEXOL 300 MG/ML  SOLN
30.0000 mL | Freq: Once | INTRAMUSCULAR | Status: AC | PRN
  Administered 2023-04-07: 30 mL via ORAL

## 2023-04-08 ENCOUNTER — Other Ambulatory Visit: Payer: Self-pay | Admitting: Emergency Medicine

## 2023-04-13 ENCOUNTER — Inpatient Hospital Stay (HOSPITAL_BASED_OUTPATIENT_CLINIC_OR_DEPARTMENT_OTHER): Payer: Commercial Managed Care - PPO | Admitting: Internal Medicine

## 2023-04-13 ENCOUNTER — Other Ambulatory Visit: Payer: Commercial Managed Care - PPO

## 2023-04-13 ENCOUNTER — Ambulatory Visit: Payer: Commercial Managed Care - PPO | Admitting: Internal Medicine

## 2023-04-13 ENCOUNTER — Ambulatory Visit: Payer: Commercial Managed Care - PPO

## 2023-04-13 ENCOUNTER — Inpatient Hospital Stay: Payer: Commercial Managed Care - PPO

## 2023-04-13 ENCOUNTER — Inpatient Hospital Stay: Payer: Commercial Managed Care - PPO | Attending: Internal Medicine

## 2023-04-13 VITALS — BP 122/75 | HR 98 | Temp 98.0°F | Resp 18 | Wt 104.0 lb

## 2023-04-13 DIAGNOSIS — R590 Localized enlarged lymph nodes: Secondary | ICD-10-CM | POA: Diagnosis not present

## 2023-04-13 DIAGNOSIS — T451X5A Adverse effect of antineoplastic and immunosuppressive drugs, initial encounter: Secondary | ICD-10-CM

## 2023-04-13 DIAGNOSIS — C3411 Malignant neoplasm of upper lobe, right bronchus or lung: Secondary | ICD-10-CM | POA: Diagnosis present

## 2023-04-13 DIAGNOSIS — C3491 Malignant neoplasm of unspecified part of right bronchus or lung: Secondary | ICD-10-CM

## 2023-04-13 DIAGNOSIS — Z79620 Long term (current) use of immunosuppressive biologic: Secondary | ICD-10-CM | POA: Insufficient documentation

## 2023-04-13 DIAGNOSIS — C770 Secondary and unspecified malignant neoplasm of lymph nodes of head, face and neck: Secondary | ICD-10-CM | POA: Insufficient documentation

## 2023-04-13 DIAGNOSIS — M549 Dorsalgia, unspecified: Secondary | ICD-10-CM | POA: Diagnosis not present

## 2023-04-13 DIAGNOSIS — Z95828 Presence of other vascular implants and grafts: Secondary | ICD-10-CM

## 2023-04-13 DIAGNOSIS — Z5111 Encounter for antineoplastic chemotherapy: Secondary | ICD-10-CM | POA: Insufficient documentation

## 2023-04-13 DIAGNOSIS — Z5112 Encounter for antineoplastic immunotherapy: Secondary | ICD-10-CM | POA: Diagnosis present

## 2023-04-13 DIAGNOSIS — K5900 Constipation, unspecified: Secondary | ICD-10-CM | POA: Diagnosis not present

## 2023-04-13 LAB — CBC WITH DIFFERENTIAL (CANCER CENTER ONLY)
Abs Immature Granulocytes: 0.01 10*3/uL (ref 0.00–0.07)
Basophils Absolute: 0 10*3/uL (ref 0.0–0.1)
Basophils Relative: 0 %
Eosinophils Absolute: 0.2 10*3/uL (ref 0.0–0.5)
Eosinophils Relative: 5 %
HCT: 31.1 % — ABNORMAL LOW (ref 36.0–46.0)
Hemoglobin: 10.8 g/dL — ABNORMAL LOW (ref 12.0–15.0)
Immature Granulocytes: 0 %
Lymphocytes Relative: 19 %
Lymphs Abs: 0.5 10*3/uL — ABNORMAL LOW (ref 0.7–4.0)
MCH: 36.7 pg — ABNORMAL HIGH (ref 26.0–34.0)
MCHC: 34.7 g/dL (ref 30.0–36.0)
MCV: 105.8 fL — ABNORMAL HIGH (ref 80.0–100.0)
Monocytes Absolute: 0.4 10*3/uL (ref 0.1–1.0)
Monocytes Relative: 13 %
Neutro Abs: 1.7 10*3/uL (ref 1.7–7.7)
Neutrophils Relative %: 63 %
Platelet Count: 213 10*3/uL (ref 150–400)
RBC: 2.94 MIL/uL — ABNORMAL LOW (ref 3.87–5.11)
RDW: 14.9 % (ref 11.5–15.5)
WBC Count: 2.8 10*3/uL — ABNORMAL LOW (ref 4.0–10.5)
nRBC: 0 % (ref 0.0–0.2)

## 2023-04-13 LAB — TSH: TSH: 3.636 u[IU]/mL (ref 0.350–4.500)

## 2023-04-13 LAB — CMP (CANCER CENTER ONLY)
ALT: 20 U/L (ref 0–44)
AST: 23 U/L (ref 15–41)
Albumin: 3.8 g/dL (ref 3.5–5.0)
Alkaline Phosphatase: 120 U/L (ref 38–126)
Anion gap: 6 (ref 5–15)
BUN: 10 mg/dL (ref 6–20)
CO2: 28 mmol/L (ref 22–32)
Calcium: 9.3 mg/dL (ref 8.9–10.3)
Chloride: 108 mmol/L (ref 98–111)
Creatinine: 0.98 mg/dL (ref 0.44–1.00)
GFR, Estimated: >60 mL/min (ref >60)
Glucose, Bld: 91 mg/dL (ref 70–99)
Potassium: 3.8 mmol/L (ref 3.5–5.1)
Sodium: 142 mmol/L (ref 135–145)
Total Bilirubin: 0.5 mg/dL (ref 0.0–1.2)
Total Protein: 6.9 g/dL (ref 6.5–8.1)

## 2023-04-13 MED ORDER — SODIUM CHLORIDE 0.9% FLUSH
10.0000 mL | INTRAVENOUS | Status: DC | PRN
  Administered 2023-04-13 (×2): 10 mL

## 2023-04-13 MED ORDER — SODIUM CHLORIDE 0.9% FLUSH
10.0000 mL | Freq: Once | INTRAVENOUS | Status: AC
  Administered 2023-04-13: 10 mL

## 2023-04-13 MED ORDER — SODIUM CHLORIDE 0.9 % IV SOLN: Freq: Once | INTRAVENOUS | Status: AC

## 2023-04-13 MED ORDER — ONDANSETRON HCL 4 MG/2ML IJ SOLN
8.0000 mg | Freq: Once | INTRAMUSCULAR | Status: AC
Start: 2023-04-13 — End: 2023-04-13
  Administered 2023-04-13: 8 mg via INTRAVENOUS
  Filled 2023-04-13: qty 4

## 2023-04-13 MED ORDER — SODIUM CHLORIDE 0.9 % IV SOLN
200.0000 mg | Freq: Once | INTRAVENOUS | Status: AC
  Administered 2023-04-13: 200 mg via INTRAVENOUS
  Filled 2023-04-13: qty 200

## 2023-04-13 MED ORDER — HEPARIN SOD (PORK) LOCK FLUSH 100 UNIT/ML IV SOLN
500.0000 [IU] | Freq: Once | INTRAVENOUS | Status: AC | PRN
Start: 2023-04-13 — End: 2023-04-13
  Administered 2023-04-13: 500 [IU]

## 2023-04-13 MED ORDER — SODIUM CHLORIDE 0.9 % IV SOLN
400.0000 mg/m2 | Freq: Once | INTRAVENOUS | Status: AC
  Administered 2023-04-13: 600 mg via INTRAVENOUS
  Filled 2023-04-13: qty 20

## 2023-04-13 MED ORDER — CYANOCOBALAMIN 1000 MCG/ML IJ SOLN
1000.0000 ug | Freq: Once | INTRAMUSCULAR | Status: AC
Start: 2023-04-13 — End: 2023-04-13
  Administered 2023-04-13: 1000 ug via INTRAMUSCULAR
  Filled 2023-04-13: qty 1

## 2023-04-13 NOTE — Progress Notes (Signed)
 [facility] [phone]   [phone]  OFFICE PROGRESS NOTE  Panagiota, Perfetti, NP [address] [place] [address]  DIAGNOSIS: Recurrent/metastatic non-small cell lung cancer initially diagnosed as stage IIIB  (T1b, N3, M0) non-small cell lung cancer, adenocarcinoma she presented with right upper lobe nodule in addition to bulky right hilar, mediastinal, and left supraclavicular lymphadenopathy diagnosed in November 2022.  The patient had evidence for disease recurrence in the mediastinal and right supraclavicular lymphadenopathy in August 2023.  The patient had evidence of metastatic disease in February 2024 with several supraclavicular, thoracic and precarinal lymphadenopathy as well as upper abdominal lymph nodes and small liver lesion.  DETECTED ALTERATION(S) / BIOMARKER(S) % CFDNA OR AMPLIFICATION ASSOCIATED FDA-APPROVED THERAPIES CLINICAL TRIAL AVAILABILITY [id number] ND 0.5 5 50 100 4.7%  RHOAG17E ND 0.5 5 50 100 1.8%  [id number] ND 0.5 5 50 100 1.9%  BIOMARKER ADDITIONAL DETAILS Tumor Mutational Burden (TMB) 19.02 mut/Mb MSI Status Stable (MSS) PD-L1 Tumor Proportion Score (TPS)* <1%  Biomarker Findings By [facility] on 05/06/2022 Tumor Mutational Burden - 17 Muts/Mb Microsatellite status - MS-Stable Genomic Findings For a complete list of the genes assayed, please refer to the Appendix. CTNNB1 S37C CRKL amplification MAP2K4 loss exons 2-11 TP53 V143A 8 Disease relevant genes with no reportable alterations: ALK, BRAF, EGFR, ERBB2, KRAS, MET, RET, ROS1  PDL1 0%   PRIOR THERAPY:  1) Concurrent chemoradiation with carboplatin for an AUC of 2 and paclitaxel 45 mg per metered squared.  First dose on 01/05/2021.  Status post 7 cycles of treatment.  Last dose was given February 16, 2021. 2) Consolidation immunotherapy with Imfinzi 1500 Mg IV every 4 weeks.  First dose March 25, 2021.  Status post 3 cycles.  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 3) Palliative radiotherapy to the enlarging right supraclavicular lymphadenopathy under the care of Dr. Roselind Messier expected to be completed on October 28, 2021.   CURRENT THERAPY: Palliative systemic chemotherapy with carboplatin for AUC of 5, Alimta 500 Mg/M2 and Keytruda 200 Mg IV every 3 weeks.  First dose April 05, 2022.  Status post 17 cycles.  Starting from cycle #3 her dose of carboplatin was reduced to AUC of 4 and Alimta 400 Mg/M2.  Starting from cycle #5 the patient will be on maintenance treatment with Alimta and Keytruda every 3 weeks.  INTERVAL HISTORY: Julie Nicholson 58 y.o. female returns to the clinic today for follow-up visit. Discussed the use of AI scribe software for clinical note transcription with the patient, who gave verbal consent to proceed.  History of Present Illness   The patient, with non-small cell lung cancer, presents for a follow-up visit regarding her ongoing cancer treatment.  She is currently undergoing maintenance therapy with Alimta and Keytruda every three weeks and is starting her fifth cycle today, having completed seventeen cycles so far.  A recent imaging study of the neck, chest, abdomen, and pelvis revealed a small lymph node in the abdomen measuring approximately 1.6 cm. She recalls discussing this with her daughter [name].  she is noting that similar lymph nodes have appeared and disappeared in the past.  Since her last visit, her cough has resolved, although she experiences a slight cough in the mornings which clears up after moving around. Any phlegm produced is clear. She denies nausea, vomiting, diarrhea, fever, or chills. She mentions a slight weight loss of about a pound.  She experiences pain in the middle  part of her back, particularly when taking deep breaths or moving around too much.  She occasionally experiences constipation, for which she uses a drinkable remedy.         MEDICAL HISTORY: Past Medical History:  Diagnosis Date   Abdominal discomfort    Cancer (HCC)    Chronic headaches    due to allergies, sinus   COPD (chronic obstructive pulmonary disease) (HCC)    per 2012 chest xray   pt states she doesn not have this now (04/10/2013)   Deviated nasal septum    Eustachian tube dysfunction    GERD (gastroesophageal reflux disease)    occasional uses Tums / Rolaids   Hearing loss    right ear   High cholesterol    History of radiation therapy    right lung 01/07/2021-02/19/2021  Dr Antony Blackbird   History of radiation therapy    Lymph nodes of head,face,neck- 12/23/22-01/07/23- Dr. Antony Blackbird   Migraine    "only once in a blue moon since RX'd allergy shots" (04/10/2013)   Pancreatitis 02/08/2013   Pneumonia    Rhinitis, allergic     ALLERGIES:  is allergic to carboplatin.  MEDICATIONS:  Current Outpatient Medications  Medication Sig Dispense Refill   acetaminophen (TYLENOL) 500 MG tablet Take 1,000 mg by mouth every 6 (six) hours as needed for moderate pain.     albuterol (PROVENTIL) (2.5 MG/3ML) 0.083% nebulizer solution albuterol sulfate 2.5 mg/3 mL (0.083 %) solution for nebulization  USE 1 VIAL IN NEBULIZER EVERY 6 HOURS AS NEEDED FOR WHEEZING OR SHORTNESS OF BREATH     albuterol (VENTOLIN HFA) 108 (90 Base) MCG/ACT inhaler Inhale 1-2 puffs into the lungs every 6 (six) hours as needed for wheezing or shortness of breath. 18 g 3   aspirin EC 81 MG tablet Take 81 mg by mouth at bedtime.     atorvastatin (LIPITOR) 40 MG tablet Take 40 mg by mouth at bedtime.     azelastine (ASTELIN) 0.1 % nasal spray Place 1 spray into both nostrils 2 (two) times daily as needed. Use in each nostril as directed 30 mL 5   azithromycin (ZITHROMAX Z-PAK) 250 MG tablet Take as directed 6 each 0   benzonatate (TESSALON) 200 MG capsule Take 1 capsule (200 mg total) by mouth 3 (three) times daily as needed. 30 capsule 0   Biotin 5000 MCG TABS Take 5,000  mcg by mouth at bedtime.     buPROPion (WELLBUTRIN XL) 150 MG 24 hr tablet Take 150 mg by mouth every morning.     Ca Carbonate-Mag Hydroxide (ROLAIDS PO) Take 1 tablet by mouth daily as needed (heartburn).     cetirizine (ZYRTEC ALLERGY) 10 MG tablet Take 1 tablet (10 mg total) by mouth daily as needed for rhinitis. 30 tablet 5   doxycycline (VIBRAMYCIN) 100 MG capsule Take 100 mg by mouth 2 (two) times daily.     famotidine (PEPCID) 20 MG tablet Take 1 tablet by mouth twice daily 60 tablet 0   fluticasone (FLONASE) 50 MCG/ACT nasal spray Place 2 sprays into both nostrils daily. 16 g 5   folic acid (FOLVITE) 1 MG tablet Take 1 tablet by mouth once daily 30 tablet 0   gabapentin (NEURONTIN) 300 MG capsule Take 300 mg by mouth.     HYDROcodone bit-homatropine (HYCODAN) 5-1.5 MG/5ML syrup Take 5 mLs by mouth every 6 (six) hours as needed for cough. 120 mL 0   hydrocortisone 1 % ointment Apply 1 Application topically 2 (  two) times daily. 30 g 0   lidocaine-prilocaine (EMLA) cream APPLY TO PORT-A-CATH 30 TO 60 MINUTES BEFORE TREATMENT 30 g 0   linaCLOtide (LINZESS PO) Take by mouth.     methylPREDNISolone (MEDROL DOSEPAK) 4 MG TBPK tablet Use as instructed 21 tablet 0   Multiple Vitamins-Minerals (HAIR SKIN AND NAILS FORMULA PO) Take 1 tablet by mouth daily.     ondansetron (ZOFRAN-ODT) 8 MG disintegrating tablet DISSOLVE 1 TABLET IN MOUTH THREE TIMES DAILY AS NEEDED 20 tablet 0   Oxycodone HCl 10 MG TABS Take 1 tablet by mouth 3 (three) times daily as needed (pain).     PARoxetine (PAXIL) 10 MG tablet Take 10 mg by mouth daily.     prochlorperazine (COMPAZINE) 10 MG tablet Take 1 tablet (10 mg total) by mouth every 6 (six) hours as needed. 30 tablet 2   rizatriptan (MAXALT-MLT) 10 MG disintegrating tablet Take 10 mg by mouth as needed for migraine. May repeat in 2 hours if needed     sertraline (ZOLOFT) 50 MG tablet Take 1 tablet by mouth daily.     Tiotropium Bromide-Olodaterol (STIOLTO RESPIMAT)  2.5-2.5 MCG/ACT AERS Inhale 2 puffs into the lungs daily. 12 g 3   No current facility-administered medications for this visit.    SURGICAL HISTORY:  Past Surgical History:  Procedure Laterality Date   ABDOMINAL HYSTERECTOMY  1995   tx endometriosis, both ovaries removed   ADENOIDECTOMY     APPENDECTOMY  late 1990's   BIOPSY  02/26/2021   Procedure: BIOPSY;  Surgeon: Rachael Fee, MD;  Location: [facility] ENDOSCOPY;  Service: Endoscopy;;   BRONCHIAL BRUSHINGS  12/15/2020   Procedure: BRONCHIAL BRUSHINGS;  Surgeon: Leslye Peer, MD;  Location: [facility] ENDOSCOPY;  Service: Cardiopulmonary;;   BRONCHIAL NEEDLE ASPIRATION BIOPSY  12/15/2020   Procedure: BRONCHIAL NEEDLE ASPIRATION BIOPSIES;  Surgeon: Leslye Peer, MD;  Location: [facility] ENDOSCOPY;  Service: Cardiopulmonary;;   CHOLECYSTECTOMY  04/10/2013   CHOLECYSTECTOMY N/A 04/10/2013   Procedure: LAPAROSCOPIC CHOLECYSTECTOMY WITH INTRAOPERATIVE CHOLANGIOGRAM;  Surgeon: Adolph Pollack, MD;  Location: [facility] OR;  Service: General;  Laterality: N/A;   ELECTROMAGNETIC NAVIGATION BROCHOSCOPY  12/15/2020   Procedure: ELECTROMAGNETIC NAVIGATION BRONCHOSCOPY;  Surgeon: Leslye Peer, MD;  Location: [facility] ENDOSCOPY;  Service: Cardiopulmonary;;   ESOPHAGOGASTRODUODENOSCOPY (EGD) WITH PROPOFOL N/A 02/26/2021   Procedure: ESOPHAGOGASTRODUODENOSCOPY (EGD) WITH PROPOFOL;  Surgeon: Rachael Fee, MD;  Location: [facility] ENDOSCOPY;  Service: Endoscopy;  Laterality: N/A;   EUS N/A 02/16/2013   Procedure: UPPER ENDOSCOPIC ULTRASOUND (EUS) LINEAR;  Surgeon: Rachael Fee, MD;  Location: [facility] ENDOSCOPY;  Service: Endoscopy;  Laterality: N/A;  radial linear   IR IMAGING GUIDED PORT INSERTION  06/30/2022   KNEE ARTHROSCOPY Right 1980's   "cartilage OR"   LAPAROSCOPIC ENDOMETRIOSIS FULGURATION  1980's   MYRINGOTOMY WITH TUBE PLACEMENT Right 07/13/2018   Procedure: MYRINGOTOMY WITH TUBE PLACEMENT;  Surgeon: Vernie Murders, MD;  Location: [facility];  Service:  ENT;  Laterality: Right;   MYRINGOTOMY WITH TUBE PLACEMENT Right 06/04/2021   Procedure: MYRINGOTOMY WITH BUTTERFLY TUBE PLACEMENT;  Surgeon: Vernie Murders, MD;  Location: [facility];  Service: ENT;  Laterality: Right;   NASOPHARYNGOSCOPY EUSTATION TUBE BALLOON DILATION Right 07/13/2018   Procedure: NASOPHARYNGOSCOPY EUSTATION TUBE BALLOON DILATION;  Surgeon: Vernie Murders, MD;  Location: [facility];  Service: ENT;  Laterality: Right;   TONSILLECTOMY AND ADENOIDECTOMY  ~ 1980   adenoidectomy   TUBAL LIGATION  ~ 1987   TURBINATE REDUCTION Right 07/13/2018  Procedure: OUTFRACTURE TURBINATE;  Surgeon: Vernie Murders, MD;  Location: [facility]ERY [facility];  Service: ENT;  Laterality: Right;   TYMPANOSTOMY TUBE PLACEMENT     VIDEO BRONCHOSCOPY WITH ENDOBRONCHIAL ULTRASOUND N/A 12/15/2020   Procedure: ROBOTIC VIDEO BRONCHOSCOPY WITH ENDOBRONCHIAL ULTRASOUND;  Surgeon: Leslye Peer, MD;  Location: [facility] ENDOSCOPY;  Service: Cardiopulmonary;  Laterality: N/A;   WRIST SURGERY Left    w/plate    REVIEW OF SYSTEMS:  Constitutional: positive for fatigue Eyes: negative Ears, nose, mouth, throat, and face: negative Respiratory: negative Cardiovascular: negative Gastrointestinal: negative Genitourinary:negative Integument/breast: negative Hematologic/lymphatic: negative Musculoskeletal:negative Neurological: negative Behavioral/Psych: negative Endocrine: negative Allergic/Immunologic: negative   PHYSICAL EXAMINATION: General appearance: alert, cooperative, fatigued, and no distress Head: Normocephalic, without obvious abnormality, atraumatic Neck: moderate anterior cervical adenopathy, no JVD, supple, symmetrical, trachea midline, and thyroid not enlarged, symmetric, no tenderness/mass/nodules Lymph nodes: Cervical, supraclavicular, and axillary nodes normal. Resp: clear to auscultation bilaterally Back: symmetric, no curvature. ROM normal. No CVA tenderness. Cardio: regular  rate and rhythm, S1, S2 normal, no murmur, click, rub or gallop GI: soft, non-tender; bowel sounds normal; no masses,  no organomegaly Extremities: extremities normal, atraumatic, no cyanosis or edema Neurologic: Alert and oriented X 3, normal strength and tone. Normal symmetric reflexes. Normal coordination and gait  ECOG PERFORMANCE STATUS: 1 - Symptomatic but completely ambulatory  Blood pressure 122/75, pulse 98, temperature 98 F (36.7 C), temperature source Temporal, resp. rate 18, weight 104 lb (47.2 kg), SpO2 98%.  LABORATORY DATA: Lab Results  Component Value Date   WBC 2.8 (L) 04/13/2023   HGB 10.8 (L) 04/13/2023   HCT 31.1 (L) 04/13/2023   MCV 105.8 (H) 04/13/2023   PLT 213 04/13/2023      Chemistry      Component Value Date/Time   NA 142 04/13/2023 1053   K 3.8 04/13/2023 1053   CL 108 04/13/2023 1053   CO2 28 04/13/2023 1053   BUN 10 04/13/2023 1053   CREATININE 0.98 04/13/2023 1053      Component Value Date/Time   CALCIUM 9.3 04/13/2023 1053   ALKPHOS 120 04/13/2023 1053   AST 23 04/13/2023 1053   ALT 20 04/13/2023 1053   BILITOT 0.5 04/13/2023 1053       RADIOGRAPHIC STUDIES: CT Soft Tissue Neck W Contrast Result Date: 04/13/2023 CLINICAL DATA:  Hematologic malignancy, assess for treatment response. Metastatic lung cancer involving the neck. EXAM: CT NECK WITH CONTRAST TECHNIQUE: Multidetector CT imaging of the neck was performed using the standard protocol following the bolus administration of intravenous contrast. RADIATION DOSE REDUCTION: This exam was performed according to the departmental dose-optimization program which includes automated exposure control, adjustment of the mA and/or kV according to patient size and/or use of iterative reconstruction technique. CONTRAST:  OMNIPAQUE IOHEXOL 300 MG/ML  SOLN COMPARISON:  02/03/2023 FINDINGS: Pharynx and larynx: No evidence of mass or inflammation Salivary glands: No inflammation, mass, or stone.  Thyroid: Unremarkable Lymph nodes: No enlarging nodes. A low-density node in the left supraclavicular fossa shows further regression, now 7 mm in length as compared to 10 mm. Vascular: Porta catheter on the right. Atheromatous calcification which is scattered in the neck. Limited intracranial: Negative Visualized orbits: Partial coverage is negative Mastoids and visualized paranasal sinuses: Polypoid densities at the floor the bilateral maxillary sinus, likely retention cyst. Skeleton: No acute or aggressive finding. Upper chest: Reported separately IMPRESSION: Some further reduction in a known left supraclavicular cavitary node.No new or progressive disease in the neck. Chest CT reported separately. Electronically  Signed   By: Tiburcio Pea M.D.   On: 04/13/2023 08:41   CT CHEST ABDOMEN PELVIS W CONTRAST Result Date: 04/12/2023 CLINICAL DATA:  Metastatic lung cancer restaging * Tracking Code: BO * EXAM: CT CHEST, ABDOMEN, AND PELVIS WITH CONTRAST TECHNIQUE: Multidetector CT imaging of the chest, abdomen and pelvis was performed following the standard protocol during bolus administration of intravenous contrast. RADIATION DOSE REDUCTION: This exam was performed according to the departmental dose-optimization program which includes automated exposure control, adjustment of the mA and/or kV according to patient size and/or use of iterative reconstruction technique. CONTRAST:  OMNIPAQUE IOHEXOL 300 MG/ML SOLN additional oral enteric contrast COMPARISON:  02/03/2023 FINDINGS: CT CHEST FINDINGS Cardiovascular: Right chest port catheter. Normal heart size. No pericardial effusion. Mediastinum/Nodes: Unchanged post treatment appearance of treated soft tissue about the mediastinum and right hilum (series 2, image 27). No discretely enlarged lymph nodes. Circumferential wall thickening of the midesophagus underlying radiation change, likely related to local treatment (series 2, image 49). Thyroid gland and trachea  demonstrate no significant findings. Lungs/Pleura: Severe emphysema. Unchanged post treatment of the right chest with bandlike perihilar radiation fibrosis and bronchiectasis (series 4, image 65). No pleural effusion or pneumothorax. Musculoskeletal: No chest wall abnormality. No acute osseous findings. CT ABDOMEN PELVIS FINDINGS Hepatobiliary: No focal liver abnormality is seen. Status post cholecystectomy. Unchanged postoperative biliary ductal dilatation Pancreas: Unchanged prominence of the central pancreatic duct measuring up to 0.4 cm in caliber (series 2, image 62). No pancreatic ductal dilatation or surrounding inflammatory changes. Spleen: Normal in size without significant abnormality. Adrenals/Urinary Tract: Adrenal glands are unremarkable. Kidneys are normal, without renal calculi, solid lesion, or hydronephrosis. Bladder is unremarkable. Stomach/Bowel: Stomach is within normal limits. Status post appendectomy. No evidence of bowel wall thickening, distention, or inflammatory changes. Vascular/Lymphatic: Aortic atherosclerosis. Newly enlarged left retroperitoneal lymph node measuring 1.6 x 1.2 cm (series 2, image 62) Reproductive: Status post hysterectomy. Other: No abdominal wall hernia or abnormality. No ascites. Musculoskeletal: No acute osseous findings. IMPRESSION: 1. Newly enlarged left retroperitoneal lymph node measuring 1.6 x 1.2 cm, concerning for nodal metastatic disease. 2. Unchanged post treatment appearance of the right chest as well as treated soft tissue about the mediastinum and right hilum. No discretely enlarged lymph nodes within the chest. PET-CT can be considered to assess for metabolic activity in the setting of enlarged retroperitoneal lymph node described above. 3. Severe emphysema. Aortic Atherosclerosis (ICD10-I70.0) and Emphysema (ICD10-J43.9). Electronically Signed   By: Jearld Lesch M.D.   On: 04/12/2023 16:53    ASSESSMENT AND PLAN: This is a very pleasant 59 years old  white female with stage IIIB (T1b, N3, M0) non-small cell lung cancer, adenocarcinoma diagnosed in November 2022 with no actionable mutation and negative PD-L1 expression. The patient completed a course of concurrent chemoradiation with weekly carboplatin for AUC of 2 and paclitaxel 45 Mg/M2 status post 7 cycles.  She has been tolerating her treatment well except for the mild odynophagia and skin burns. She was also recently admitted to the hospital complaining of dysphagia and odynophagia secondary to radiation induced esophagitis.  She is feeling much better but continues to have residual dysphagia.  She is followed by gastroenterology and was seen by Dr. Christella Hartigan during her hospitalization. Her scan showed improvement of her disease. I recommended for the patient treatment with consolidation immunotherapy with Imfinzi 1500 Mg IV every 4 weeks.  Status post 3 cycles.  Last dose was given in April 2023.  Her treatment was discontinued secondary to  suspicious immunotherapy mediated pneumonitis with significant shortness of breath at that time and she was treated with a tapered dose of prednisone. Unfortunately her scan showed evidence for disease recurrence with enlargement of lower right cervical lymph nodes as well as mediastinal lymphadenopathy.  She has palpable right cervical lymphadenopathy. 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 The patient underwent ultrasound-guided core biopsy of the right supraclavicular lymph nodes but the final pathology showed necrotic tumor cells with complete coagulative necrosis with focal fibrous tissue and histiocytic reaction.  She also had  MRI of the brain that showed no evidence of metastatic disease to the brain. The patient was seen by Dr. Roselind Messier and started palliative radiotherapy to the enlarging right supraclavicular lymphadenopathy.  This was completed on October 28, 2021. The patient was found to have metastatic disease in February 2024 with several lymphadenopathy in the chest as well as supraclavicular, upper abdomen as well as suspicious small liver metastasis. 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  I will send the tissue biopsy to [facility] for molecular studies. She is currently undergoing systemic chemotherapy with carboplatin for AUC of 5, Alimta 500 Mg/M2 and Keytruda 200 Mg IV every 3 weeks status post 17 cycles.  Starting from cycle #3, I reduced her dose of carboplatin to AUC of 4 and Alimta 400 Mg/M2.  Starting from cycle #5 the patient will be on maintenance treatment with Alimta and Keytruda every 3 weeks. She has been tolerating her treatment fairly well with no concerning adverse effects except for mild fatigue. She had repeat CT scan of the chest, pelvis performed recently as well as CT scan of the neck.  I personally and independently reviewed the scan images and discussed the result and showed the images to the patient today.     Non-small cell lung cancer She is on maintenance therapy with Alimta and Keytruda, currently on cycle eighteen. Recent imaging revealed a 1.6 cm abdominal lymph node of uncertain etiology, possibly cancer-related or inflammatory. She reports occasional morning cough with clear sputum, mild weight loss, and intermittent constipation managed with a laxative. No nausea, vomiting, diarrhea, fever, or chills. She also experiences middle back pain with deep breaths or excessive movement, potentially linked  to lymph node activity. - Continue maintenance therapy with Alimta and Keytruda. - Monitor the abdominal lymph node with future imaging. - Address constipation with current laxative regimen. - Schedule follow-up in three weeks.  Abdominal lymphadenopathy A 1.6 cm abdominal lymph node was identified on imaging. Differential diagnosis includes cancer-related lymphadenopathy or benign inflammation. Monitoring with future imaging is planned to assess changes. Radiation therapy may be considered if it enlarges and remains isolated. - Monitor the abdominal lymph node with future imaging. - Consider radiation therapy if the lymph node enlarges and remains isolated.  Back pain She reports middle back pain exacerbated by deep breaths or excessive movement, possibly related to lymph node activity or musculoskeletal causes. No specific intervention is planned. - Monitor symptoms and reassess if pain persists or worsens.  Constipation She experiences intermittent constipation, effectively managed with a laxative. - Continue current laxative  regimen as needed.   The patient was advised to call immediately if she has any concerning symptoms in the interval. The patient voices understanding of current disease status and treatment options and is in agreement with the current care plan.  All questions were answered. The patient knows to call the clinic with any problems, questions or concerns. We can certainly see the patient much sooner if necessary.  The total time spent in the appointment was 30 minutes.  Disclaimer: This note was dictated with voice recognition software. Similar sounding words can inadvertently be transcribed and may not be corrected upon review.

## 2023-04-13 NOTE — Patient Instructions (Signed)
 [facility] - A DEPT OF [facility]  Discharge Instructions: Thank you for choosing [facility] to provide your oncology and hematology care.   If you have a lab appointment with the Cancer Center, please go directly to the Cancer Center and check in at the registration area.   Wear comfortable clothing and clothing appropriate for easy access to any Portacath or PICC line.   We strive to give you quality time with your provider. You may need to reschedule your appointment if you arrive late (15 or more minutes).  Arriving late affects you and other patients whose appointments are after yours.  Also, if you miss three or more appointments without notifying the office, you may be dismissed from the clinic at the provider's discretion.      For prescription refill requests, have your pharmacy contact our office and allow 72 hours for refills to be completed.    Today you received the following chemotherapy and/or immunotherapy agents Alimta      To help prevent nausea and vomiting after your treatment, we encourage you to take your nausea medication as directed.  BELOW ARE SYMPTOMS THAT SHOULD BE REPORTED IMMEDIATELY: *FEVER GREATER THAN 100.4 F (38 C) OR HIGHER *CHILLS OR SWEATING *NAUSEA AND VOMITING THAT IS NOT CONTROLLED WITH YOUR NAUSEA MEDICATION *UNUSUAL SHORTNESS OF BREATH *UNUSUAL BRUISING OR BLEEDING *URINARY PROBLEMS (pain or burning when urinating, or frequent urination) *BOWEL PROBLEMS (unusual diarrhea, constipation, pain near the anus) TENDERNESS IN MOUTH AND THROAT WITH OR WITHOUT PRESENCE OF ULCERS (sore throat, sores in mouth, or a toothache) UNUSUAL RASH, SWELLING OR PAIN  UNUSUAL VAGINAL DISCHARGE OR ITCHING   Items with * indicate a potential emergency and should be followed up as soon as possible or go to the Emergency Department if any problems should occur.  Please show the CHEMOTHERAPY ALERT CARD or IMMUNOTHERAPY  ALERT CARD at check-in to the Emergency Department and triage nurse.  Should you have questions after your visit or need to cancel or reschedule your appointment, please contact [facility] - A DEPT OF [facility]  Dept: [phone]  and follow the prompts.  Office hours are 8:00 a.m. to 4:30 p.m. Monday - Friday. Please note that voicemails left after 4:00 p.m. may not be returned until the following business day.  We are closed weekends and major holidays. You have access to a nurse at all times for urgent questions. Please call the main number to the clinic Dept: [phone] and follow the prompts.   For any non-urgent questions, you may also contact your provider using MyChart. We now offer e-Visits for anyone 79 and older to request care online for non-urgent symptoms. For details visit [email or web address].   Also download the MyChart app! Go to the app store, search "MyChart", open the app, select [place], and log in with your MyChart username and password.

## 2023-04-13 NOTE — Progress Notes (Signed)
 Per Dr. Arbutus Ped ,it is ok to administer Keytruda today .

## 2023-04-14 LAB — T4: T4, Total: 9.5 ug/dL (ref 4.5–12.0)

## 2023-04-21 ENCOUNTER — Encounter: Payer: Self-pay | Admitting: Emergency Medicine

## 2023-04-21 ENCOUNTER — Ambulatory Visit (INDEPENDENT_AMBULATORY_CARE_PROVIDER_SITE_OTHER): Admitting: Emergency Medicine

## 2023-04-21 VITALS — BP 108/58 | HR 98 | Ht 62.0 in | Wt 104.0 lb

## 2023-04-21 DIAGNOSIS — J449 Chronic obstructive pulmonary disease, unspecified: Secondary | ICD-10-CM

## 2023-04-21 DIAGNOSIS — J3089 Other allergic rhinitis: Secondary | ICD-10-CM | POA: Diagnosis not present

## 2023-04-21 DIAGNOSIS — C3491 Malignant neoplasm of unspecified part of right bronchus or lung: Secondary | ICD-10-CM

## 2023-04-21 MED ORDER — FLUTICASONE PROPIONATE 50 MCG/ACT NA SUSP: 2.0000 | Freq: Every day | NASAL | 5 refills | Status: AC

## 2023-04-21 NOTE — Assessment & Plan Note (Signed)
 No clear evidence for an exacerbation currently but she does have more dyspnea over the last 2 to 3 weeks.  Question whether this is because her allergies are more active.  She suspects that this is the case.  Will try to treat them more aggressively.  I do not see any changes on her CT chest that would explain progressive dyspnea.  Continue Stiolto for now.  If she remains short of breath we may decide to change to an alternative like Breztri, especially since she has had prednisone in flares more than once in the last year.  Follow-up in our office in about 4 to 6 weeks to see how she is doing with more aggressive treatment of her allergies.

## 2023-04-21 NOTE — Progress Notes (Signed)
   Subjective:    Patient ID: Julie Nicholson, female    DOB: 1965-04-01, 58 y.o.   MRN: [id number]  HPI  She reports that she is having increased exertional SOB and with bending over for the last 2 weeks. No clear precipitant. She has stable cough in the am, clear mucous. Her PND and sinuses have been more active. Has not been on her flonase, does use her loratadine. No wheeze. She had prednisone in February. She is not using astelin NS. No reflux.  She is on stiolto. She been on pred x 2 in last year.   CT chest abdomen pelvis 04/07/2023 reviewed by me showed a newly enlarged left retroperitoneal lymph node 1.6 x 1.2 cm concerning for possible nodal metastasis.  Unchanged posttreatment appearance of the right chest and mediastinum, right hilum without any discretely enlarged nodes   Review of Systems As per HPI      Objective:   Physical Exam Vitals:   04/21/23 0959  BP: (!) 108/58  Pulse: 98  SpO2: 99%  Weight: 104 lb (47.2 kg)  Height: 5\' 2"  (1.575 m)    Gen: Pleasant, well-nourished, in no distress,  normal affect  ENT: No lesions,  mouth clear,  oropharynx clear, no postnasal drip  Neck: No JVD, no stridor  Lungs: No use of accessory muscles, no wheeze or crackles, good air movement  Cardiovascular: RRR, heart sounds normal, no murmur or gallops, no peripheral edema  Musculoskeletal: No deformities, no cyanosis or clubbing  Neuro: alert, awake, non focal  Skin: Warm, no lesions or rash       Assessment & Plan:   COPD (chronic obstructive pulmonary disease) (HCC) No clear evidence for an exacerbation currently but she does have more dyspnea over the last 2 to 3 weeks.  Question whether this is because her allergies are more active.  She suspects that this is the case.  Will try to treat them more aggressively.  I do not see any changes on her CT chest that would explain progressive dyspnea.  Continue Stiolto for now.  If she remains short of breath we may decide  to change to an alternative like Breztri, especially since she has had prednisone in flares more than once in the last year.  Follow-up in our office in about 4 to 6 weeks to see how she is doing with more aggressive treatment of her allergies.  Allergic rhinitis Continue the loratadine, add back fluticasone nasal spray every day on a schedule during the allergy season.  Hopefully better control will make her overall breathing, coughing better.  Adenocarcinoma of right lung, stage 4 (HCC) On palliative chemotherapy and immunotherapy with Dr. Arbutus Ped.  Her most recent CT chest was stable.  She did have a new retroperitoneal lymph node that is being followed.     Levy Pupa, MD, PhD 04/21/2023, 10:22 AM [facility] [phone] or if no answer before 7:00PM call [phone] For any issues after 7:00PM please call eLink [phone]

## 2023-04-21 NOTE — Assessment & Plan Note (Signed)
 On palliative chemotherapy and immunotherapy with Dr. Arbutus Ped.  Her most recent CT chest was stable.  She did have a new retroperitoneal lymph node that is being followed.

## 2023-04-21 NOTE — Patient Instructions (Signed)
 Continue your loratadine 10 mg once daily Add back your fluticasone nasal spray, 2 sprays each nostril once daily every day during the allergy season.  Prescription sent to your pharmacy Continue Stiolto 2 puffs once daily.  Depending on how your cough and breathing are doing as we go forward we may decide to change this inhaler to an alternative. We will hold off on prednisone for now Keep albuterol available to use 2 puffs when needed for shortness of breath, chest tightness, wheezing. Continue to follow with Dr. Arbutus Ped as planned Follow-up in our office in about 4 to 6 weeks so we can assess how you are doing

## 2023-04-21 NOTE — Assessment & Plan Note (Signed)
 Continue the loratadine, add back fluticasone nasal spray every day on a schedule during the allergy season.  Hopefully better control will make her overall breathing, coughing better.

## 2023-04-23 ENCOUNTER — Other Ambulatory Visit: Payer: Self-pay | Admitting: Physician Assistant

## 2023-04-23 DIAGNOSIS — C3411 Malignant neoplasm of upper lobe, right bronchus or lung: Secondary | ICD-10-CM

## 2023-05-03 ENCOUNTER — Other Ambulatory Visit: Payer: Self-pay | Admitting: Emergency Medicine

## 2023-05-04 ENCOUNTER — Ambulatory Visit: Payer: Commercial Managed Care - PPO | Admitting: Internal Medicine

## 2023-05-04 ENCOUNTER — Other Ambulatory Visit: Payer: Self-pay

## 2023-05-04 ENCOUNTER — Inpatient Hospital Stay (HOSPITAL_BASED_OUTPATIENT_CLINIC_OR_DEPARTMENT_OTHER): Payer: Commercial Managed Care - PPO | Admitting: Internal Medicine

## 2023-05-04 ENCOUNTER — Inpatient Hospital Stay: Payer: Commercial Managed Care - PPO | Attending: Internal Medicine

## 2023-05-04 ENCOUNTER — Other Ambulatory Visit: Payer: Commercial Managed Care - PPO

## 2023-05-04 ENCOUNTER — Ambulatory Visit: Payer: Commercial Managed Care - PPO

## 2023-05-04 ENCOUNTER — Inpatient Hospital Stay: Payer: Commercial Managed Care - PPO

## 2023-05-04 VITALS — HR 96

## 2023-05-04 VITALS — BP 131/65 | HR 104 | Temp 98.0°F | Resp 16 | Ht 62.0 in | Wt 106.4 lb

## 2023-05-04 DIAGNOSIS — Z452 Encounter for adjustment and management of vascular access device: Secondary | ICD-10-CM | POA: Diagnosis not present

## 2023-05-04 DIAGNOSIS — C3411 Malignant neoplasm of upper lobe, right bronchus or lung: Secondary | ICD-10-CM | POA: Insufficient documentation

## 2023-05-04 DIAGNOSIS — Z5112 Encounter for antineoplastic immunotherapy: Secondary | ICD-10-CM | POA: Diagnosis present

## 2023-05-04 DIAGNOSIS — H6691 Otitis media, unspecified, right ear: Secondary | ICD-10-CM | POA: Insufficient documentation

## 2023-05-04 DIAGNOSIS — C3491 Malignant neoplasm of unspecified part of right bronchus or lung: Secondary | ICD-10-CM | POA: Diagnosis not present

## 2023-05-04 DIAGNOSIS — C778 Secondary and unspecified malignant neoplasm of lymph nodes of multiple regions: Secondary | ICD-10-CM | POA: Insufficient documentation

## 2023-05-04 DIAGNOSIS — R600 Localized edema: Secondary | ICD-10-CM | POA: Diagnosis not present

## 2023-05-04 DIAGNOSIS — D649 Anemia, unspecified: Secondary | ICD-10-CM | POA: Diagnosis not present

## 2023-05-04 DIAGNOSIS — Z95828 Presence of other vascular implants and grafts: Secondary | ICD-10-CM

## 2023-05-04 DIAGNOSIS — Z5111 Encounter for antineoplastic chemotherapy: Secondary | ICD-10-CM | POA: Diagnosis present

## 2023-05-04 DIAGNOSIS — D72819 Decreased white blood cell count, unspecified: Secondary | ICD-10-CM | POA: Diagnosis not present

## 2023-05-04 DIAGNOSIS — D701 Agranulocytosis secondary to cancer chemotherapy: Secondary | ICD-10-CM

## 2023-05-04 LAB — CBC WITH DIFFERENTIAL (CANCER CENTER ONLY)
Abs Immature Granulocytes: 0.01 10*3/uL (ref 0.00–0.07)
Basophils Absolute: 0 10*3/uL (ref 0.0–0.1)
Basophils Relative: 0 %
Eosinophils Absolute: 0.1 10*3/uL (ref 0.0–0.5)
Eosinophils Relative: 3 %
HCT: 30.7 % — ABNORMAL LOW (ref 36.0–46.0)
Hemoglobin: 10.6 g/dL — ABNORMAL LOW (ref 12.0–15.0)
Immature Granulocytes: 0 %
Lymphocytes Relative: 15 %
Lymphs Abs: 0.5 10*3/uL — ABNORMAL LOW (ref 0.7–4.0)
MCH: 36.7 pg — ABNORMAL HIGH (ref 26.0–34.0)
MCHC: 34.5 g/dL (ref 30.0–36.0)
MCV: 106.2 fL — ABNORMAL HIGH (ref 80.0–100.0)
Monocytes Absolute: 0.4 10*3/uL (ref 0.1–1.0)
Monocytes Relative: 12 %
Neutro Abs: 2.5 10*3/uL (ref 1.7–7.7)
Neutrophils Relative %: 70 %
Platelet Count: 193 10*3/uL (ref 150–400)
RBC: 2.89 MIL/uL — ABNORMAL LOW (ref 3.87–5.11)
RDW: 14.5 % (ref 11.5–15.5)
WBC Count: 3.5 10*3/uL — ABNORMAL LOW (ref 4.0–10.5)
nRBC: 0 % (ref 0.0–0.2)

## 2023-05-04 LAB — CMP (CANCER CENTER ONLY)
ALT: 17 U/L (ref 0–44)
AST: 22 U/L (ref 15–41)
Albumin: 3.9 g/dL (ref 3.5–5.0)
Alkaline Phosphatase: 123 U/L (ref 38–126)
Anion gap: 6 (ref 5–15)
BUN: 11 mg/dL (ref 6–20)
CO2: 28 mmol/L (ref 22–32)
Calcium: 9.7 mg/dL (ref 8.9–10.3)
Chloride: 108 mmol/L (ref 98–111)
Creatinine: 0.93 mg/dL (ref 0.44–1.00)
GFR, Estimated: >60 mL/min (ref >60)
Glucose, Bld: 112 mg/dL — ABNORMAL HIGH (ref 70–99)
Potassium: 3.6 mmol/L (ref 3.5–5.1)
Sodium: 142 mmol/L (ref 135–145)
Total Bilirubin: 0.5 mg/dL (ref 0.0–1.2)
Total Protein: 7 g/dL (ref 6.5–8.1)

## 2023-05-04 MED ORDER — SODIUM CHLORIDE 0.9 % IV SOLN
400.0000 mg/m2 | Freq: Once | INTRAVENOUS | Status: AC
  Administered 2023-05-04: 600 mg via INTRAVENOUS
  Filled 2023-05-04: qty 20

## 2023-05-04 MED ORDER — SODIUM CHLORIDE 0.9 % IV SOLN: Freq: Once | INTRAVENOUS | Status: AC

## 2023-05-04 MED ORDER — HEPARIN SOD (PORK) LOCK FLUSH 100 UNIT/ML IV SOLN
500.0000 [IU] | Freq: Once | INTRAVENOUS | Status: AC | PRN
Start: 2023-05-04 — End: 2023-05-04
  Administered 2023-05-04: 500 [IU]

## 2023-05-04 MED ORDER — SODIUM CHLORIDE 0.9 % IV SOLN
200.0000 mg | Freq: Once | INTRAVENOUS | Status: AC
  Administered 2023-05-04: 200 mg via INTRAVENOUS
  Filled 2023-05-04: qty 200

## 2023-05-04 MED ORDER — SODIUM CHLORIDE 0.9% FLUSH
10.0000 mL | INTRAVENOUS | Status: DC | PRN
Start: 2023-05-04 — End: 2023-05-04
  Administered 2023-05-04: 10 mL

## 2023-05-04 MED ORDER — SODIUM CHLORIDE 0.9% FLUSH
10.0000 mL | Freq: Once | INTRAVENOUS | Status: AC
Start: 2023-05-04 — End: 2023-05-04
  Administered 2023-05-04: 10 mL

## 2023-05-04 MED ORDER — ONDANSETRON HCL 4 MG/2ML IJ SOLN
8.0000 mg | Freq: Once | INTRAMUSCULAR | Status: AC
  Administered 2023-05-04: 8 mg via INTRAVENOUS
  Filled 2023-05-04: qty 4

## 2023-05-04 NOTE — Progress Notes (Signed)
 [facility] [phone]   [phone]  OFFICE PROGRESS NOTE  Julie Nicholson, [place], NP [address] [place] [address]  DIAGNOSIS: Recurrent/metastatic non-small cell lung cancer initially diagnosed as stage IIIB  (T1b, N3, M0) non-small cell lung cancer, adenocarcinoma she presented with right upper lobe nodule in addition to bulky right hilar, mediastinal, and left supraclavicular lymphadenopathy diagnosed in November 2022.  The patient had evidence for disease recurrence in the mediastinal and right supraclavicular lymphadenopathy in August 2023.  The patient had evidence of metastatic disease in February 2024 with several supraclavicular, thoracic and precarinal lymphadenopathy as well as upper abdominal lymph nodes and small liver lesion.  DETECTED ALTERATION(S) / BIOMARKER(S) % CFDNA OR AMPLIFICATION ASSOCIATED FDA-APPROVED THERAPIES CLINICAL TRIAL AVAILABILITY [id number] ND 0.5 5 50 100 4.7%  RHOAG17E ND 0.5 5 50 100 1.8%  [id number] ND 0.5 5 50 100 1.9%  BIOMARKER ADDITIONAL DETAILS Tumor Mutational Burden (TMB) 19.02 mut/Mb MSI Status Stable (MSS) PD-L1 Tumor Proportion Score (TPS)* <1%  Biomarker Findings By [facility] on 05/06/2022 Tumor Mutational Burden - 17 Muts/Mb Microsatellite status - MS-Stable Genomic Findings For a complete list of the genes assayed, please refer to the Appendix. CTNNB1 S37C CRKL amplification MAP2K4 loss exons 2-11 TP53 V143A 8 Disease relevant genes with no reportable alterations: ALK, BRAF, EGFR, ERBB2, KRAS, MET, RET, ROS1  PDL1 0%   PRIOR THERAPY:  1) Concurrent chemoradiation with carboplatin for an AUC of 2 and paclitaxel 45 mg per metered squared.  First dose on 01/05/2021.  Status post 7 cycles of treatment.  Last dose was given February 16, 2021. 2) Consolidation immunotherapy with Imfinzi 1500 Mg IV every 4 weeks.  First dose March 25, 2021.  Status post 3 cycles.  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 3) Palliative radiotherapy to the enlarging right supraclavicular lymphadenopathy under the care of Dr. Roselind Messier expected to be completed on October 28, 2021.   CURRENT THERAPY: Palliative systemic chemotherapy with carboplatin for AUC of 5, Alimta 500 Mg/M2 and Keytruda 200 Mg IV every 3 weeks.  First dose April 05, 2022.  Status post 18 cycles.  Starting from cycle #3 her dose of carboplatin was reduced to AUC of 4 and Alimta 400 Mg/M2.  Starting from cycle #5 the patient will be on maintenance treatment with Alimta and Keytruda every 3 weeks.  INTERVAL HISTORY: Julie Nicholson 58 y.o. female returns to the clinic today for follow-up visit. Discussed the use of AI scribe software for clinical note transcription with the patient, who gave verbal consent to proceed.  History of Present Illness   Julie Nicholson "Julie Nicholson" is a 58 year old female who presents for evaluation before starting cycle number nineteen of chemotherapy.  She has completed eighteen cycles of chemotherapy for cancer and is undergoing treatment. There are no new significant symptoms since the last visit three weeks ago.  She has experienced some difficulty breathing, which she attributes to pollen exposure, and a persistent morning cough that produces clear phlegm. No hemoptysis is present.  Her ankles have been swelling, particularly by the end of the day, which she associates with prolonged standing. The swelling is described as 'pretty solid' by the end of the day.  She experiences occasional headaches, which are consistent with her usual pattern.  No abdominal pain, nausea, vomiting, or diarrhea.       MEDICAL HISTORY: Past Medical History:  Diagnosis Date   Abdominal discomfort  Cancer [facility])    Chronic headaches    due to allergies, sinus   COPD (chronic obstructive pulmonary disease) (HCC)    per 2012 chest xray   pt states she doesn not  have this now (04/10/2013)   Deviated nasal septum    Eustachian tube dysfunction    GERD (gastroesophageal reflux disease)    occasional uses Tums / Rolaids   Hearing loss    right ear   High cholesterol    History of radiation therapy    right lung 01/07/2021-02/19/2021  Dr Antony Blackbird   History of radiation therapy    Lymph nodes of head,face,neck- 12/23/22-01/07/23- Dr. Antony Blackbird   Migraine    "only once in a blue moon since RX'd allergy shots" (04/10/2013)   Pancreatitis 02/08/2013   Pneumonia    Rhinitis, allergic     ALLERGIES:  is allergic to carboplatin.  MEDICATIONS:  Current Outpatient Medications  Medication Sig Dispense Refill   acetaminophen (TYLENOL) 500 MG tablet Take 1,000 mg by mouth every 6 (six) hours as needed for moderate pain.     albuterol (PROVENTIL) (2.5 MG/3ML) 0.083% nebulizer solution albuterol sulfate 2.5 mg/3 mL (0.083 %) solution for nebulization  USE 1 VIAL IN NEBULIZER EVERY 6 HOURS AS NEEDED FOR WHEEZING OR SHORTNESS OF BREATH     albuterol (VENTOLIN HFA) 108 (90 Base) MCG/ACT inhaler Inhale 1-2 puffs into the lungs every 6 (six) hours as needed for wheezing or shortness of breath. 18 g 3   aspirin EC 81 MG tablet Take 81 mg by mouth at bedtime.     atorvastatin (LIPITOR) 40 MG tablet Take 40 mg by mouth at bedtime.     azelastine (ASTELIN) 0.1 % nasal spray Place 1 spray into both nostrils 2 (two) times daily as needed. Use in each nostril as directed 30 mL 5   azithromycin (ZITHROMAX Z-PAK) 250 MG tablet Take as directed 6 each 0   benzonatate (TESSALON) 200 MG capsule Take 1 capsule (200 mg total) by mouth 3 (three) times daily as needed. 30 capsule 0   Biotin 5000 MCG TABS Take 5,000 mcg by mouth at bedtime.     buPROPion (WELLBUTRIN XL) 150 MG 24 hr tablet Take 150 mg by mouth every morning.     Ca Carbonate-Mag Hydroxide (ROLAIDS PO) Take 1 tablet by mouth daily as needed (heartburn).     cetirizine (ZYRTEC ALLERGY) 10 MG tablet Take 1  tablet (10 mg total) by mouth daily as needed for rhinitis. 30 tablet 5   doxycycline (VIBRAMYCIN) 100 MG capsule Take 100 mg by mouth 2 (two) times daily.     famotidine (PEPCID) 20 MG tablet Take 1 tablet by mouth twice daily 60 tablet 0   fluticasone (FLONASE) 50 MCG/ACT nasal spray Place 2 sprays into both nostrils daily. 16 g 5   folic acid (FOLVITE) 1 MG tablet Take 1 tablet by mouth once daily 30 tablet 0   gabapentin (NEURONTIN) 300 MG capsule Take 300 mg by mouth. (Patient not taking: Reported on 04/21/2023)     HYDROcodone bit-homatropine (HYCODAN) 5-1.5 MG/5ML syrup Take 5 mLs by mouth every 6 (six) hours as needed for cough. 120 mL 0   hydrocortisone 1 % ointment Apply 1 Application topically 2 (two) times daily. (Patient not taking: Reported on 04/21/2023) 30 g 0   lidocaine-prilocaine (EMLA) cream APPLY TO PORT-A-CATH 30 TO 60 MINUTES BEFORE TREATMENT 30 g 0   linaCLOtide (LINZESS PO) Take by mouth.     methylPREDNISolone (  MEDROL DOSEPAK) 4 MG TBPK tablet Use as instructed (Patient not taking: Reported on 04/21/2023) 21 tablet 0   Multiple Vitamins-Minerals (HAIR SKIN AND NAILS FORMULA PO) Take 1 tablet by mouth daily.     ondansetron (ZOFRAN-ODT) 8 MG disintegrating tablet DISSOLVE 1 TABLET IN MOUTH THREE TIMES DAILY AS NEEDED 20 tablet 0   Oxycodone HCl 10 MG TABS Take 1 tablet by mouth 3 (three) times daily as needed (pain).     PARoxetine (PAXIL) 10 MG tablet Take 10 mg by mouth daily. (Patient not taking: Reported on 04/21/2023)     prochlorperazine (COMPAZINE) 10 MG tablet Take 1 tablet (10 mg total) by mouth every 6 (six) hours as needed. 30 tablet 2   rizatriptan (MAXALT-MLT) 10 MG disintegrating tablet Take 10 mg by mouth as needed for migraine. May repeat in 2 hours if needed     sertraline (ZOLOFT) 50 MG tablet Take 1 tablet by mouth daily.     Tiotropium Bromide-Olodaterol (STIOLTO RESPIMAT) 2.5-2.5 MCG/ACT AERS Inhale 2 puffs into the lungs daily. 12 g 3   No current  facility-administered medications for this visit.    SURGICAL HISTORY:  Past Surgical History:  Procedure Laterality Date   ABDOMINAL HYSTERECTOMY  1995   tx endometriosis, both ovaries removed   ADENOIDECTOMY     APPENDECTOMY  late 1990's   BIOPSY  02/26/2021   Procedure: BIOPSY;  Surgeon: Rachael Fee, MD;  Location: [facility] ENDOSCOPY;  Service: Endoscopy;;   BRONCHIAL BRUSHINGS  12/15/2020   Procedure: BRONCHIAL BRUSHINGS;  Surgeon: Leslye Peer, MD;  Location: [facility] ENDOSCOPY;  Service: Cardiopulmonary;;   BRONCHIAL NEEDLE ASPIRATION BIOPSY  12/15/2020   Procedure: BRONCHIAL NEEDLE ASPIRATION BIOPSIES;  Surgeon: Leslye Peer, MD;  Location: [facility] ENDOSCOPY;  Service: Cardiopulmonary;;   CHOLECYSTECTOMY  04/10/2013   CHOLECYSTECTOMY N/A 04/10/2013   Procedure: LAPAROSCOPIC CHOLECYSTECTOMY WITH INTRAOPERATIVE CHOLANGIOGRAM;  Surgeon: Adolph Pollack, MD;  Location: [facility] OR;  Service: General;  Laterality: N/A;   ELECTROMAGNETIC NAVIGATION BROCHOSCOPY  12/15/2020   Procedure: ELECTROMAGNETIC NAVIGATION BRONCHOSCOPY;  Surgeon: Leslye Peer, MD;  Location: [facility] ENDOSCOPY;  Service: Cardiopulmonary;;   ESOPHAGOGASTRODUODENOSCOPY (EGD) WITH PROPOFOL N/A 02/26/2021   Procedure: ESOPHAGOGASTRODUODENOSCOPY (EGD) WITH PROPOFOL;  Surgeon: Rachael Fee, MD;  Location: [facility] ENDOSCOPY;  Service: Endoscopy;  Laterality: N/A;   EUS N/A 02/16/2013   Procedure: UPPER ENDOSCOPIC ULTRASOUND (EUS) LINEAR;  Surgeon: Rachael Fee, MD;  Location: [facility] ENDOSCOPY;  Service: Endoscopy;  Laterality: N/A;  radial linear   IR IMAGING GUIDED PORT INSERTION  06/30/2022   KNEE ARTHROSCOPY Right 1980's   "cartilage OR"   LAPAROSCOPIC ENDOMETRIOSIS FULGURATION  1980's   MYRINGOTOMY WITH TUBE PLACEMENT Right 07/13/2018   Procedure: MYRINGOTOMY WITH TUBE PLACEMENT;  Surgeon: Vernie Murders, MD;  Location: [facility] SUR[facility]ERY CNTR;  Service: ENT;  Laterality: Right;   MYRINGOTOMY WITH TUBE PLACEMENT Right 06/04/2021    Procedure: MYRINGOTOMY WITH BUTTERFLY TUBE PLACEMENT;  Surgeon: Vernie Murders, MD;  Location: [facility] SURGERY CNTR;  Service: ENT;  Laterality: Right;   NASOPHARYNGOSCOPY EUSTATION TUBE BALLOON DILATION Right 07/13/2018   Procedure: NASOPHARYNGOSCOPY EUSTATION TUBE BALLOON DILATION;  Surgeon: Vernie Murders, MD;  Location: [facility] SURGERY CNTR;  Service: ENT;  Laterality: Right;   TONSILLECTOMY AND ADENOIDECTOMY  ~ 1980   adenoidectomy   TUBAL LIGATION  ~ 1987   TURBINATE REDUCTION Right 07/13/2018   Procedure: OUTFRACTURE TURBINATE;  Surgeon: Vernie Murders, MD;  Location: [facility] SURGERY CNTR;  Service: ENT;  Laterality: Right;   TYMPANOSTOMY TUBE PLACEMENT  VIDEO BRONCHOSCOPY WITH ENDOBRONCHIAL ULTRASOUND N/A 12/15/2020   Procedure: ROBOTIC VIDEO BRONCHOSCOPY WITH ENDOBRONCHIAL ULTRASOUND;  Surgeon: Leslye Peer, MD;  Location: [facility] ENDOSCOPY;  Service: Cardiopulmonary;  Laterality: N/A;   WRIST SURGERY Left    w/plate    REVIEW OF SYSTEMS:  A comprehensive review of systems was negative except for: Constitutional: positive for fatigue Respiratory: positive for wheezing   PHYSICAL EXAMINATION: General appearance: alert, cooperative, fatigued, and no distress Head: Normocephalic, without obvious abnormality, atraumatic Neck: moderate anterior cervical adenopathy, no JVD, supple, symmetrical, trachea midline, and thyroid not enlarged, symmetric, no tenderness/mass/nodules Lymph nodes: Cervical, supraclavicular, and axillary nodes normal. Resp: clear to auscultation bilaterally Back: symmetric, no curvature. ROM normal. No CVA tenderness. Cardio: regular rate and rhythm, S1, S2 normal, no murmur, click, rub or gallop GI: soft, non-tender; bowel sounds normal; no masses,  no organomegaly Extremities: extremities normal, atraumatic, no cyanosis or edema  ECOG PERFORMANCE STATUS: 1 - Symptomatic but completely ambulatory  Blood pressure 131/65, pulse (!) 104, temperature 98 F (36.7 C),  temperature source Temporal, resp. rate 16, height 5\' 2"  (1.575 m), weight 106 lb 6.4 oz (48.3 kg), SpO2 100%.  LABORATORY DATA: Lab Results  Component Value Date   WBC 3.5 (L) 05/04/2023   HGB 10.6 (L) 05/04/2023   HCT 30.7 (L) 05/04/2023   MCV 106.2 (H) 05/04/2023   PLT 193 05/04/2023      Chemistry      Component Value Date/Time   NA 142 04/13/2023 1053   K 3.8 04/13/2023 1053   CL 108 04/13/2023 1053   CO2 28 04/13/2023 1053   BUN 10 04/13/2023 1053   CREATININE 0.98 04/13/2023 1053      Component Value Date/Time   CALCIUM 9.3 04/13/2023 1053   ALKPHOS 120 04/13/2023 1053   AST 23 04/13/2023 1053   ALT 20 04/13/2023 1053   BILITOT 0.5 04/13/2023 1053       RADIOGRAPHIC STUDIES: CT Soft Tissue Neck W Contrast Result Date: 04/13/2023 CLINICAL DATA:  Hematologic malignancy, assess for treatment response. Metastatic lung cancer involving the neck. EXAM: CT NECK WITH CONTRAST TECHNIQUE: Multidetector CT imaging of the neck was performed using the standard protocol following the bolus administration of intravenous contrast. RADIATION DOSE REDUCTION: This exam was performed according to the departmental dose-optimization program which includes automated exposure control, adjustment of the mA and/or kV according to patient size and/or use of iterative reconstruction technique. CONTRAST:  OMNIPAQUE IOHEXOL 300 MG/ML  SOLN COMPARISON:  02/03/2023 FINDINGS: Pharynx and larynx: No evidence of mass or inflammation Salivary glands: No inflammation, mass, or stone. Thyroid: Unremarkable Lymph nodes: No enlarging nodes. A low-density node in the left supraclavicular fossa shows further regression, now 7 mm in length as compared to 10 mm. Vascular: Porta catheter on the right. Atheromatous calcification which is scattered in the neck. Limited intracranial: Negative Visualized orbits: Partial coverage is negative Mastoids and visualized paranasal sinuses: Polypoid densities at the floor the  bilateral maxillary sinus, likely retention cyst. Skeleton: No acute or aggressive finding. Upper chest: Reported separately IMPRESSION: Some further reduction in a known left supraclavicular cavitary node.No new or progressive disease in the neck. Chest CT reported separately. Electronically Signed   By: Tiburcio Pea M.D.   On: 04/13/2023 08:41   CT CHEST ABDOMEN PELVIS W CONTRAST Result Date: 04/12/2023 CLINICAL DATA:  Metastatic lung cancer restaging * Tracking Code: BO * EXAM: CT CHEST, ABDOMEN, AND PELVIS WITH CONTRAST TECHNIQUE: Multidetector CT imaging of the chest, abdomen and  pelvis was performed following the standard protocol during bolus administration of intravenous contrast. RADIATION DOSE REDUCTION: This exam was performed according to the departmental dose-optimization program which includes automated exposure control, adjustment of the mA and/or kV according to patient size and/or use of iterative reconstruction technique. CONTRAST:  OMNIPAQUE IOHEXOL 300 MG/ML SOLN additional oral enteric contrast COMPARISON:  02/03/2023 FINDINGS: CT CHEST FINDINGS Cardiovascular: Right chest port catheter. Normal heart size. No pericardial effusion. Mediastinum/Nodes: Unchanged post treatment appearance of treated soft tissue about the mediastinum and right hilum (series 2, image 27). No discretely enlarged lymph nodes. Circumferential wall thickening of the midesophagus underlying radiation change, likely related to local treatment (series 2, image 49). Thyroid gland and trachea demonstrate no significant findings. Lungs/Pleura: Severe emphysema. Unchanged post treatment of the right chest with bandlike perihilar radiation fibrosis and bronchiectasis (series 4, image 65). No pleural effusion or pneumothorax. Musculoskeletal: No chest wall abnormality. No acute osseous findings. CT ABDOMEN PELVIS FINDINGS Hepatobiliary: No focal liver abnormality is seen. Status post cholecystectomy. Unchanged  postoperative biliary ductal dilatation Pancreas: Unchanged prominence of the central pancreatic duct measuring up to 0.4 cm in caliber (series 2, image 62). No pancreatic ductal dilatation or surrounding inflammatory changes. Spleen: Normal in size without significant abnormality. Adrenals/Urinary Tract: Adrenal glands are unremarkable. Kidneys are normal, without renal calculi, solid lesion, or hydronephrosis. Bladder is unremarkable. Stomach/Bowel: Stomach is within normal limits. Status post appendectomy. No evidence of bowel wall thickening, distention, or inflammatory changes. Vascular/Lymphatic: Aortic atherosclerosis. Newly enlarged left retroperitoneal lymph node measuring 1.6 x 1.2 cm (series 2, image 62) Reproductive: Status post hysterectomy. Other: No abdominal wall hernia or abnormality. No ascites. Musculoskeletal: No acute osseous findings. IMPRESSION: 1. Newly enlarged left retroperitoneal lymph node measuring 1.6 x 1.2 cm, concerning for nodal metastatic disease. 2. Unchanged post treatment appearance of the right chest as well as treated soft tissue about the mediastinum and right hilum. No discretely enlarged lymph nodes within the chest. PET-CT can be considered to assess for metabolic activity in the setting of enlarged retroperitoneal lymph node described above. 3. Severe emphysema. Aortic Atherosclerosis (ICD10-I70.0) and Emphysema (ICD10-J43.9). Electronically Signed   By: Jearld Lesch M.D.   On: 04/12/2023 16:53    ASSESSMENT AND PLAN: This is a very pleasant 58 years old white female with stage IIIB (T1b, N3, M0) non-small cell lung cancer, adenocarcinoma diagnosed in November 2022 with no actionable mutation and negative PD-L1 expression. The patient completed a course of concurrent chemoradiation with weekly carboplatin for AUC of 2 and paclitaxel 45 Mg/M2 status post 7 cycles.  She has been tolerating her treatment well except for the mild odynophagia and skin burns. She was also  recently admitted to the hospital complaining of dysphagia and odynophagia secondary to radiation induced esophagitis.  She is feeling much better but continues to have residual dysphagia.  She is followed by gastroenterology and was seen by Dr. Christella Hartigan during her hospitalization. Her scan showed improvement of her disease. I recommended for the patient treatment with consolidation immunotherapy with Imfinzi 1500 Mg IV every 4 weeks.  Status post 3 cycles.  Last dose was given in April 2023.  Her treatment was discontinued secondary to suspicious immunotherapy mediated pneumonitis with significant shortness of breath at that time and she was treated with a tapered dose of prednisone. Unfortunately her scan showed evidence for disease recurrence with enlargement of lower right cervical lymph nodes as well as mediastinal lymphadenopathy.  She has palpable right cervical lymphadenopathy. 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 The patient underwent ultrasound-guided core biopsy of the right supraclavicular lymph nodes but the final pathology showed necrotic tumor cells with complete coagulative necrosis with focal fibrous tissue and histiocytic reaction.  She also had MRI of the brain that showed no evidence of metastatic disease to the brain. The patient was seen by Dr. Roselind Messier and started palliative radiotherapy to the enlarging right supraclavicular lymphadenopathy.  This was completed on October 28, 2021. The patient was found to have metastatic disease in February 2024 with several lymphadenopathy in the chest as well as supraclavicular, upper abdomen as well as suspicious  small liver metastasis. 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  I will send the tissue biopsy to [facility] for molecular studies. She is currently undergoing systemic chemotherapy with carboplatin for AUC of 5, Alimta 500 Mg/M2 and Keytruda 200 Mg IV every 3 weeks status post 18 cycles.  Starting from cycle #3, I reduced her dose of carboplatin to AUC of 4 and Alimta 400 Mg/M2.  Starting from cycle #5 the patient will be on maintenance treatment with Alimta and Keytruda every 3 weeks.    Non-small cell lung cancer Undergoing chemotherapy with Alimta and Keytruda, currently on cycle nineteen. Reports mild dyspnea likely due to seasonal allergies and a persistent morning cough with clear phlegm, not indicative of infection or malignancy progression. No abdominal pain, nausea, vomiting, or diarrhea. Occasional headaches consistent with usual pattern. Mild ankle swelling, possibly related to Alimta or prolonged standing. New wheezing on examination. - Proceed with cycle nineteen of Alimta and Keytruda chemotherapy. - Schedule follow-up in three weeks for the next cycle. - Repeat imaging after the next cycle to assess the abdominal lymph node. - Advise to report any concerning symptoms.  Wheezing New wheezing on examination, possibly related to mild dyspnea and seasonal allergies. - Monitor respiratory symptoms and advise on bronchodilator use if symptoms worsen.  Peripheral edema Reports ankle swelling by the end of the day, possibly related to Alimta chemotherapy or prolonged standing. Described as solid, suggesting fluid retention rather than inflammation. - Monitor ankle swelling and advise on leg elevation and compression stockings if needed.   The patient was advised to call immediately if she has any concerning  symptoms in the interval. The patient voices understanding of current disease status and treatment options and is in agreement with the current care plan.  All questions were answered. The patient knows to call the clinic with any problems, questions or concerns. We can certainly see the patient much sooner if necessary.  The total time spent in the appointment was 20 minutes.  Disclaimer: This note was dictated with voice recognition software. Similar sounding words can inadvertently be transcribed and may not be corrected upon review.

## 2023-05-19 ENCOUNTER — Other Ambulatory Visit: Payer: Self-pay | Admitting: Physician Assistant

## 2023-05-19 DIAGNOSIS — C3411 Malignant neoplasm of upper lobe, right bronchus or lung: Secondary | ICD-10-CM

## 2023-05-23 ENCOUNTER — Other Ambulatory Visit: Payer: Self-pay | Admitting: Medical Oncology

## 2023-05-23 ENCOUNTER — Encounter: Payer: Self-pay | Admitting: Internal Medicine

## 2023-05-23 NOTE — Progress Notes (Signed)
 Med reconciliation completed

## 2023-05-25 ENCOUNTER — Inpatient Hospital Stay: Payer: Commercial Managed Care - PPO

## 2023-05-25 ENCOUNTER — Inpatient Hospital Stay (HOSPITAL_BASED_OUTPATIENT_CLINIC_OR_DEPARTMENT_OTHER): Payer: Commercial Managed Care - PPO | Admitting: Internal Medicine

## 2023-05-25 ENCOUNTER — Other Ambulatory Visit: Payer: Self-pay | Admitting: Physician Assistant

## 2023-05-25 ENCOUNTER — Encounter: Payer: Self-pay | Admitting: Medical Oncology

## 2023-05-25 VITALS — BP 106/70 | HR 106 | Temp 97.9°F | Resp 16 | Ht 62.0 in | Wt 105.1 lb

## 2023-05-25 DIAGNOSIS — Z95828 Presence of other vascular implants and grafts: Secondary | ICD-10-CM

## 2023-05-25 DIAGNOSIS — C3491 Malignant neoplasm of unspecified part of right bronchus or lung: Secondary | ICD-10-CM

## 2023-05-25 DIAGNOSIS — C3490 Malignant neoplasm of unspecified part of unspecified bronchus or lung: Secondary | ICD-10-CM

## 2023-05-25 DIAGNOSIS — T451X5A Adverse effect of antineoplastic and immunosuppressive drugs, initial encounter: Secondary | ICD-10-CM

## 2023-05-25 DIAGNOSIS — Z5112 Encounter for antineoplastic immunotherapy: Secondary | ICD-10-CM | POA: Diagnosis not present

## 2023-05-25 LAB — CBC WITH DIFFERENTIAL (CANCER CENTER ONLY)
Abs Immature Granulocytes: 0.01 10*3/uL (ref 0.00–0.07)
Basophils Absolute: 0 10*3/uL (ref 0.0–0.1)
Basophils Relative: 1 %
Eosinophils Absolute: 0.2 10*3/uL (ref 0.0–0.5)
Eosinophils Relative: 7 %
HCT: 25.7 % — ABNORMAL LOW (ref 36.0–46.0)
Hemoglobin: 9.1 g/dL — ABNORMAL LOW (ref 12.0–15.0)
Immature Granulocytes: 1 %
Lymphocytes Relative: 20 %
Lymphs Abs: 0.4 10*3/uL — ABNORMAL LOW (ref 0.7–4.0)
MCH: 36.4 pg — ABNORMAL HIGH (ref 26.0–34.0)
MCHC: 35.4 g/dL (ref 30.0–36.0)
MCV: 102.8 fL — ABNORMAL HIGH (ref 80.0–100.0)
Monocytes Absolute: 0.3 10*3/uL (ref 0.1–1.0)
Monocytes Relative: 16 %
Neutro Abs: 1.2 10*3/uL — ABNORMAL LOW (ref 1.7–7.7)
Neutrophils Relative %: 55 %
Platelet Count: 123 10*3/uL — ABNORMAL LOW (ref 150–400)
RBC: 2.5 MIL/uL — ABNORMAL LOW (ref 3.87–5.11)
RDW: 12.4 % (ref 11.5–15.5)
WBC Count: 2.2 10*3/uL — ABNORMAL LOW (ref 4.0–10.5)
nRBC: 0 % (ref 0.0–0.2)

## 2023-05-25 LAB — CMP (CANCER CENTER ONLY)
ALT: 20 U/L (ref 0–44)
AST: 27 U/L (ref 15–41)
Albumin: 3.8 g/dL (ref 3.5–5.0)
Alkaline Phosphatase: 133 U/L — ABNORMAL HIGH (ref 38–126)
Anion gap: 5 (ref 5–15)
BUN: 10 mg/dL (ref 6–20)
CO2: 29 mmol/L (ref 22–32)
Calcium: 9.4 mg/dL (ref 8.9–10.3)
Chloride: 108 mmol/L (ref 98–111)
Creatinine: 0.85 mg/dL (ref 0.44–1.00)
GFR, Estimated: >60 mL/min (ref >60)
Glucose, Bld: 95 mg/dL (ref 70–99)
Potassium: 3.7 mmol/L (ref 3.5–5.1)
Sodium: 142 mmol/L (ref 135–145)
Total Bilirubin: 0.5 mg/dL (ref 0.0–1.2)
Total Protein: 7 g/dL (ref 6.5–8.1)

## 2023-05-25 MED ORDER — SODIUM CHLORIDE 0.9 % IV SOLN: Freq: Once | INTRAVENOUS | Status: AC

## 2023-05-25 MED ORDER — SODIUM CHLORIDE 0.9% FLUSH
10.0000 mL | Freq: Once | INTRAVENOUS | Status: AC
  Administered 2023-05-25: 10 mL

## 2023-05-25 MED ORDER — HEPARIN SOD (PORK) LOCK FLUSH 100 UNIT/ML IV SOLN: 500.0000 [IU] | Freq: Once | INTRAVENOUS | Status: DC | PRN

## 2023-05-25 MED ORDER — SODIUM CHLORIDE 0.9% FLUSH: 10.0000 mL | INTRAVENOUS | Status: DC | PRN

## 2023-05-25 MED ORDER — ONDANSETRON HCL 4 MG/2ML IJ SOLN
8.0000 mg | Freq: Once | INTRAMUSCULAR | Status: AC
  Administered 2023-05-25: 8 mg via INTRAVENOUS
  Filled 2023-05-25: qty 4

## 2023-05-25 MED ORDER — SODIUM CHLORIDE 0.9 % IV SOLN
400.0000 mg/m2 | Freq: Once | INTRAVENOUS | Status: AC
  Administered 2023-05-25: 600 mg via INTRAVENOUS
  Filled 2023-05-25: qty 20

## 2023-05-25 MED ORDER — SODIUM CHLORIDE 0.9 % IV SOLN
200.0000 mg | Freq: Once | INTRAVENOUS | Status: AC
  Administered 2023-05-25: 200 mg via INTRAVENOUS
  Filled 2023-05-25: qty 200

## 2023-05-25 NOTE — Progress Notes (Signed)
 Per Dr. Marguerita Shih ,it is ok for pt to receive Alimta and Keytruda  today with ANC=1.2.

## 2023-05-25 NOTE — Progress Notes (Signed)
 [facility] Telephone:([phone]) [phone]   Fax:([phone]  OFFICE PROGRESS NOTE  Kristle, Wesch, NP [address] [place] [address]  DIAGNOSIS: Recurrent/metastatic non-small cell lung cancer initially diagnosed as stage IIIB  (T1b, N3, M0) non-small cell lung cancer, adenocarcinoma she presented with right upper lobe nodule in addition to bulky right hilar, mediastinal, and left supraclavicular lymphadenopathy diagnosed in November 2022.  The patient had evidence for disease recurrence in the mediastinal and right supraclavicular lymphadenopathy in August 2023.  The patient had evidence of metastatic disease in February 2024 with several supraclavicular, thoracic and precarinal lymphadenopathy as well as upper abdominal lymph nodes and small liver lesion.  DETECTED ALTERATION(S) / BIOMARKER(S) % CFDNA OR AMPLIFICATION ASSOCIATED FDA-APPROVED THERAPIES CLINICAL TRIAL AVAILABILITY [id number] ND 0.5 5 50 100 4.7%  RHOAG17E ND 0.5 5 50 100 1.8%  [id number] ND 0.5 5 50 100 1.9%  BIOMARKER ADDITIONAL DETAILS Tumor Mutational Burden (TMB) 19.02 mut/Mb MSI Status Stable (MSS) PD-L1 Tumor Proportion Score (TPS)* <1%  Biomarker Findings By [facility] on 05/06/2022 Tumor Mutational Burden - 17 Muts/Mb Microsatellite status - MS-Stable Genomic Findings For a complete list of the genes assayed, please refer to the Appendix. CTNNB1 S37C CRKL amplification MAP2K4 loss exons 2-11 TP53 V143A 8 Disease relevant genes with no reportable alterations: ALK, BRAF, EGFR, ERBB2, KRAS, MET, RET, ROS1  PDL1 0%   PRIOR THERAPY:  1) Concurrent chemoradiation with carboplatin  for an AUC of 2 and paclitaxel  45 mg per metered squared.  First dose on 01/05/2021.  Status post 7 cycles of treatment.  Last dose was given February 16, 2021. 2) Consolidation immunotherapy with Imfinzi  1500 Mg IV every 4 weeks.  First dose March 25, 2021.  Status post 3 cycles.  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 3) Palliative radiotherapy to the enlarging right supraclavicular lymphadenopathy under the care of Dr. Eloise Hake expected to be completed on October 28, 2021.   CURRENT THERAPY: Palliative systemic chemotherapy with carboplatin  for AUC of 5, Alimta 500 Mg/M2 and Keytruda  200 Mg IV every 3 weeks.  First dose April 05, 2022.  Status post 19 cycles.  Starting from cycle #3 her dose of carboplatin  was reduced to AUC of 4 and Alimta 400 Mg/M2.  Starting from cycle #5 the patient will be on maintenance treatment with Alimta and Keytruda  every 3 weeks.  INTERVAL HISTORY: Julie Nicholson 58 y.o. female returns to the clinic today for follow-up visit.Discussed the use of AI scribe software for clinical note transcription with the patient, who gave verbal consent to proceed.  History of Present Illness   Julie Nicholson "[name]" is a 58 year old female with metastatic non-small cell lung cancer who presents for maintenance treatment.  Initially diagnosed with metastatic non-small cell lung cancer in November 2022, she is currently undergoing maintenance treatment with Alimta and Keytruda  every three weeks, having completed nineteen cycles. She experiences fatigue, attributed to anemia, and her white blood count is low. Her current dose of Alimta has been reduced to 400 mg. Her total white blood count is 2.2, and her neutrophil count is 1200, which is lower than desired.  She denies taking any over-the-counter medications regularly, mentioning that she rarely takes Tylenol . She is currently using ear drops for an ear infection in her right ear, diagnosed yesterday by an ear doctor. No fever or chills are reported.  She reports swelling around her ankles. She recently painted her bathroom, indicating some  level of physical activity.        MEDICAL HISTORY: Past Medical History:  Diagnosis Date   Abdominal discomfort    Cancer (HCC)     Chronic headaches    due to allergies, sinus   COPD (chronic obstructive pulmonary disease) (HCC)    per 2012 chest xray   pt states she doesn not have this now (04/10/2013)   Deviated nasal septum    Eustachian tube dysfunction    GERD (gastroesophageal reflux disease)    occasional uses Tums / Rolaids   Hearing loss    right ear   High cholesterol    History of radiation therapy    right lung 01/07/2021-02/19/2021  Dr Retta Caster   History of radiation therapy    Lymph nodes of head,face,neck- 12/23/22-01/07/23- Dr. Retta Caster   Migraine    "only once in a blue moon since RX'd allergy  shots" (04/10/2013)   Pancreatitis 02/08/2013   Pneumonia    Rhinitis, allergic     ALLERGIES:  is allergic to carboplatin .  MEDICATIONS:  Current Outpatient Medications  Medication Sig Dispense Refill   acetaminophen  (TYLENOL ) 500 MG tablet Take 1,000 mg by mouth every 6 (six) hours as needed for moderate pain.     albuterol  (PROVENTIL ) (2.5 MG/3ML) 0.083% nebulizer solution albuterol  sulfate 2.5 mg/3 mL (0.083 %) solution for nebulization  USE 1 VIAL IN NEBULIZER EVERY 6 HOURS AS NEEDED FOR WHEEZING OR SHORTNESS OF BREATH     albuterol  (VENTOLIN  HFA) 108 (90 Base) MCG/ACT inhaler Inhale 1-2 puffs into the lungs every 6 (six) hours as needed for wheezing or shortness of breath. 18 g 3   aspirin EC 81 MG tablet Take 81 mg by mouth at bedtime.     atorvastatin  (LIPITOR) 40 MG tablet Take 40 mg by mouth at bedtime.     azelastine  (ASTELIN ) 0.1 % nasal spray Place 1 spray into both nostrils 2 (two) times daily as needed. Use in each nostril as directed 30 mL 5   azithromycin  (ZITHROMAX  Z-PAK) 250 MG tablet Take as directed 6 each 0   benzonatate  (TESSALON ) 200 MG capsule Take 1 capsule (200 mg total) by mouth 3 (three) times daily as needed. 30 capsule 0   Biotin 5000 MCG TABS Take 5,000 mcg by mouth at bedtime.     buPROPion  (WELLBUTRIN  XL) 150 MG 24 hr tablet Take 150 mg by mouth every morning.      Ca Carbonate-Mag Hydroxide (ROLAIDS PO) Take 1 tablet by mouth daily as needed (heartburn).     cetirizine  (ZYRTEC  ALLERGY ) 10 MG tablet Take 1 tablet (10 mg total) by mouth daily as needed for rhinitis. 30 tablet 5   famotidine  (PEPCID ) 20 MG tablet Take 1 tablet by mouth twice daily 60 tablet 0   fluticasone  (FLONASE ) 50 MCG/ACT nasal spray Place 2 sprays into both nostrils daily. 16 g 5   folic acid  (FOLVITE ) 1 MG tablet Take 1 tablet by mouth once daily 30 tablet 0   HYDROcodone  bit-homatropine (HYCODAN) 5-1.5 MG/5ML syrup Take 5 mLs by mouth every 6 (six) hours as needed for cough. 120 mL 0   hydrocortisone  1 % ointment Apply 1 Application topically 2 (two) times daily. (Patient not taking: Reported on 04/21/2023) 30 g 0   lidocaine -prilocaine  (EMLA ) cream APPLY TO PORT-A-CATH 30 TO 60 MINUTES BEFORE TREATMENT 30 g 0   linaCLOtide (LINZESS PO) Take by mouth.     loratadine  (CLARITIN ) 10 MG tablet Take 10 mg by mouth daily.  Multiple Vitamins-Minerals (HAIR SKIN AND NAILS FORMULA PO) Take 1 tablet by mouth daily.     ondansetron  (ZOFRAN -ODT) 8 MG disintegrating tablet DISSOLVE 1 TABLET IN MOUTH THREE TIMES DAILY AS NEEDED 20 tablet 0   Oxycodone  HCl 10 MG TABS Take 1 tablet by mouth 3 (three) times daily as needed (pain).     PARoxetine (PAXIL) 10 MG tablet Take 10 mg by mouth daily. (Patient not taking: Reported on 04/21/2023)     prochlorperazine  (COMPAZINE ) 10 MG tablet Take 1 tablet (10 mg total) by mouth every 6 (six) hours as needed. 30 tablet 2   rizatriptan (MAXALT-MLT) 10 MG disintegrating tablet Take 10 mg by mouth as needed for migraine. May repeat in 2 hours if needed     sertraline (ZOLOFT) 50 MG tablet Take 1 tablet by mouth daily.     Tiotropium Bromide-Olodaterol (STIOLTO RESPIMAT ) 2.5-2.5 MCG/ACT AERS Inhale 2 puffs into the lungs daily. 12 g 3   No current facility-administered medications for this visit.    SURGICAL HISTORY:  Past Surgical History:  Procedure  Laterality Date   ABDOMINAL HYSTERECTOMY  1995   tx endometriosis, both ovaries removed   ADENOIDECTOMY     APPENDECTOMY  late 1990's   BIOPSY  02/26/2021   Procedure: BIOPSY;  Surgeon: Janel Medford, MD;  Location: [facility] ENDOSCOPY;  Service: Endoscopy;;   BRONCHIAL BRUSHINGS  12/15/2020   Procedure: BRONCHIAL BRUSHINGS;  Surgeon: Denson Flake, MD;  Location: [facility] ENDOSCOPY;  Service: Cardiopulmonary;;   BRONCHIAL NEEDLE ASPIRATION BIOPSY  12/15/2020   Procedure: BRONCHIAL NEEDLE ASPIRATION BIOPSIES;  Surgeon: Denson Flake, MD;  Location: [facility] ENDOSCOPY;  Service: Cardiopulmonary;;   CHOLECYSTECTOMY  04/10/2013   CHOLECYSTECTOMY N/A 04/10/2013   Procedure: LAPAROSCOPIC CHOLECYSTECTOMY WITH INTRAOPERATIVE CHOLANGIOGRAM;  Surgeon: Harlee Lichtenstein, MD;  Location: [facility] OR;  Service: General;  Laterality: N/A;   ELECTROMAGNETIC NAVIGATION BROCHOSCOPY  12/15/2020   Procedure: ELECTROMAGNETIC NAVIGATION BRONCHOSCOPY;  Surgeon: Denson Flake, MD;  Location: [facility] ENDOSCOPY;  Service: Cardiopulmonary;;   ESOPHAGOGASTRODUODENOSCOPY (EGD) WITH PROPOFOL  N/A 02/26/2021   Procedure: ESOPHAGOGASTRODUODENOSCOPY (EGD) WITH PROPOFOL ;  Surgeon: Janel Medford, MD;  Location: [facility]COPY;  Service: Endoscopy;  Laterality: N/A;   EUS N/A 02/16/2013   Procedure: UPPER ENDOSCOPIC ULTRASOUND (EUS) LINEAR;  Surgeon: Janel Medford, MD;  Location: [facility];  Service: Endoscopy;  Laterality: N/A;  radial linear   IR IMAGING GUIDED PORT INSERTION  06/30/2022   KNEE ARTHROSCOPY Right 1980's   "cartilage OR"   LAPAROSCOPIC ENDOMETRIOSIS FULGURATION  1980's   MYRINGOTOMY WITH TUBE PLACEMENT Right 07/13/2018   Procedure: MYRINGOTOMY WITH TUBE PLACEMENT;  Surgeon: Mellody Sprout, MD;  Location: [facility];  Service: ENT;  Laterality: Right;   MYRINGOTOMY WITH TUBE PLACEMENT Right 06/04/2021   Procedure: MYRINGOTOMY WITH BUTTERFLY TUBE PLACEMENT;  Surgeon: Mellody Sprout, MD;  Location: [facility];   Service: ENT;  Laterality: Right;   NASOPHARYNGOSCOPY EUSTATION TUBE BALLOON DILATION Right 07/13/2018   Procedure: NASOPHARYNGOSCOPY EUSTATION TUBE BALLOON DILATION;  Surgeon: Mellody Sprout, MD;  Location: [facility];  Service: ENT;  Laterality: Right;   TONSILLECTOMY AND ADENOIDECTOMY  ~ 1980   adenoidectomy   TUBAL LIGATION  ~ 1987   TURBINATE REDUCTION Right 07/13/2018   Procedure: OUTFRACTURE TURBINATE;  Surgeon: Mellody Sprout, MD;  Location: [facility];  Service: ENT;  Laterality: Right;   TYMPANOSTOMY TUBE PLACEMENT     VIDEO BRONCHOSCOPY WITH ENDOBRONCHIAL ULTRASOUND N/A 12/15/2020   Procedure: ROBOTIC VIDEO BRONCHOSCOPY WITH ENDOBRONCHIAL ULTRASOUND;  Surgeon: Denson Flake, MD;  Location: [facility] ENDOSCOPY;  Service: Cardiopulmonary;  Laterality: N/A;   WRIST SURGERY Left    w/plate    REVIEW OF SYSTEMS:  A comprehensive review of systems was negative except for: Constitutional: positive for fatigue Respiratory: positive for cough   PHYSICAL EXAMINATION: General appearance: alert, cooperative, fatigued, and no distress Head: Normocephalic, without obvious abnormality, atraumatic Neck: moderate anterior cervical adenopathy, no JVD, supple, symmetrical, trachea midline, and thyroid  not enlarged, symmetric, no tenderness/mass/nodules Lymph nodes: Cervical, supraclavicular, and axillary nodes normal. Resp: clear to auscultation bilaterally Back: symmetric, no curvature. ROM normal. No CVA tenderness. Cardio: regular rate and rhythm, S1, S2 normal, no murmur, click, rub or gallop GI: soft, non-tender; bowel sounds normal; no masses,  no organomegaly Extremities: extremities normal, atraumatic, no cyanosis or edema  ECOG PERFORMANCE STATUS: 1 - Symptomatic but completely ambulatory  Blood pressure 106/70, pulse (!) 106, temperature 97.9 F (36.6 C), temperature source Temporal, resp. rate 16, height 5\' 2"  (1.575 m), weight 105 lb 1.6 oz (47.7 kg), SpO2  100%.  LABORATORY DATA: Lab Results  Component Value Date   WBC 2.2 (L) 05/25/2023   HGB 9.1 (L) 05/25/2023   HCT 25.7 (L) 05/25/2023   MCV 102.8 (H) 05/25/2023   PLT 123 (L) 05/25/2023      Chemistry      Component Value Date/Time   NA 142 05/25/2023 1032   K 3.7 05/25/2023 1032   CL 108 05/25/2023 1032   CO2 29 05/25/2023 1032   BUN 10 05/25/2023 1032   CREATININE 0.85 05/25/2023 1032      Component Value Date/Time   CALCIUM  9.4 05/25/2023 1032   ALKPHOS 133 (H) 05/25/2023 1032   AST 27 05/25/2023 1032   ALT 20 05/25/2023 1032   BILITOT 0.5 05/25/2023 1032       RADIOGRAPHIC STUDIES: No results found.   ASSESSMENT AND PLAN: This is a very pleasant 59 years old white female with stage IIIB (T1b, N3, M0) non-small cell lung cancer, adenocarcinoma diagnosed in November 2022 with no actionable mutation and negative PD-L1 expression. The patient completed a course of concurrent chemoradiation with weekly carboplatin  for AUC of 2 and paclitaxel  45 Mg/M2 status post 7 cycles.  She has been tolerating her treatment well except for the mild odynophagia and skin burns. She was also recently admitted to the hospital complaining of dysphagia and odynophagia secondary to radiation induced esophagitis.  She is feeling much better but continues to have residual dysphagia.  She is followed by gastroenterology and was seen by Dr. Howard Macho during her hospitalization. Her scan showed improvement of her disease. I recommended for the patient treatment with consolidation immunotherapy with Imfinzi  1500 Mg IV every 4 weeks.  Status post 3 cycles.  Last dose was given in April 2023.  Her treatment was discontinued secondary to suspicious immunotherapy mediated pneumonitis with significant shortness of breath at that time and she was treated with a tapered dose of prednisone . Unfortunately her scan showed evidence for disease recurrence with enlargement of lower right cervical lymph nodes as well as  mediastinal lymphadenopathy.  She has palpable right cervical lymphadenopathy. 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 The patient underwent ultrasound-guided core biopsy of the right supraclavicular  lymph nodes but the final pathology showed necrotic tumor cells with complete coagulative necrosis with focal fibrous tissue and histiocytic reaction.  She also had MRI of the brain that showed no evidence of metastatic disease to the brain. The patient was seen by Dr. Eloise Hake and started palliative radiotherapy to the enlarging right supraclavicular lymphadenopathy.  This was completed on October 28, 2021. The patient was found to have metastatic disease in February 2024 with several lymphadenopathy in the chest as well as supraclavicular, upper abdomen as well as suspicious small liver metastasis. 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  I will send the tissue biopsy to [facility] for molecular studies. She is currently undergoing systemic chemotherapy with carboplatin  for AUC of 5, Alimta 500 Mg/M2 and Keytruda  200 Mg IV every 3 weeks status post 19 cycles.  Starting from cycle #3, I reduced her dose of carboplatin  to AUC of 4 and Alimta 400 Mg/M2.  She has been tolerating her treatment fairly well. Assessment and Plan    Metastatic non-small cell lung cancer Metastatic non-small cell lung cancer, initially diagnosed in November 2022. Currently undergoing maintenance treatment with Alimta and  Keytruda  every three weeks, having completed 19 cycles. Treatment continues despite leukopenia and anemia. White blood count is borderline low; Alimta dose reduced to 400 mg and maintained. - Continue Alimta and Keytruda  every three weeks - Order CT scan before next visit - Advise to report fever or chills  Leukopenia Leukopenia with total white blood count at 2.2 and neutrophil count at 1200. Counts are suboptimal but treatment continues. Potential need for growth factor if counts decline further. - Monitor white blood cell count - Check lab work next week - Advise to report fever or chills  Anemia Anemia contributing to fatigue. Blood levels are low but treatment with Alimta and Keytruda  continues. Monitoring is necessary due to potential further decrease in blood counts. - Monitor blood levels - Advise to report fever or chills  Peripheral edema Peripheral edema around ankles. No fever or chills reported.  Right ear infection Right ear infection treated with ear drops. No further symptoms reported.     She was advised to call immediately if she has any concerning symptoms in the interval. The patient voices understanding of current disease status and treatment options and is in agreement with the current care plan.  All questions were answered. The patient knows to call the clinic with any problems, questions or concerns. We can certainly see the patient much sooner if necessary.  The total time spent in the appointment was 20 minutes.  Disclaimer: This note was dictated with voice recognition software. Similar sounding words can inadvertently be transcribed and may not be corrected upon review.

## 2023-05-25 NOTE — Patient Instructions (Signed)
 [facility] - A DEPT OF [facility]  Discharge Instructions: Thank you for choosing [facility] to provide your oncology and hematology care.   If you have a lab appointment with the Cancer Center, please go directly to the Cancer Center and check in at the registration area.   Wear comfortable clothing and clothing appropriate for easy access to any Portacath or PICC line.   We strive to give you quality time with your provider. You may need to reschedule your appointment if you arrive late (15 or more minutes).  Arriving late affects you and other patients whose appointments are after yours.  Also, if you miss three or more appointments without notifying the office, you may be dismissed from the clinic at the provider's discretion.      For prescription refill requests, have your pharmacy contact our office and allow 72 hours for refills to be completed.    Today you received the following chemotherapy and/or immunotherapy agents: Keytruda, Alimta.       To help prevent nausea and vomiting after your treatment, we encourage you to take your nausea medication as directed.  BELOW ARE SYMPTOMS THAT SHOULD BE REPORTED IMMEDIATELY: *FEVER GREATER THAN 100.4 F (38 C) OR HIGHER *CHILLS OR SWEATING *NAUSEA AND VOMITING THAT IS NOT CONTROLLED WITH YOUR NAUSEA MEDICATION *UNUSUAL SHORTNESS OF BREATH *UNUSUAL BRUISING OR BLEEDING *URINARY PROBLEMS (pain or burning when urinating, or frequent urination) *BOWEL PROBLEMS (unusual diarrhea, constipation, pain near the anus) TENDERNESS IN MOUTH AND THROAT WITH OR WITHOUT PRESENCE OF ULCERS (sore throat, sores in mouth, or a toothache) UNUSUAL RASH, SWELLING OR PAIN  UNUSUAL VAGINAL DISCHARGE OR ITCHING   Items with * indicate a potential emergency and should be followed up as soon as possible or go to the Emergency Department if any problems should occur.  Please show the CHEMOTHERAPY ALERT CARD or  IMMUNOTHERAPY ALERT CARD at check-in to the Emergency Department and triage nurse.  Should you have questions after your visit or need to cancel or reschedule your appointment, please contact [facility] - A DEPT OF [facility]  Dept: [phone]  and follow the prompts.  Office hours are 8:00 a.m. to 4:30 p.m. Monday - Friday. Please note that voicemails left after 4:00 p.m. may not be returned until the following business day.  We are closed weekends and major holidays. You have access to a nurse at all times for urgent questions. Please call the main number to the clinic Dept: [phone] and follow the prompts.   For any non-urgent questions, you may also contact your provider using MyChart. We now offer e-Visits for anyone 61 and older to request care online for non-urgent symptoms. For details visit [email or web address].   Also download the MyChart app! Go to the app store, search "MyChart", open the app, select , and log in with your MyChart username and password.

## 2023-05-26 ENCOUNTER — Other Ambulatory Visit: Payer: Self-pay | Admitting: Internal Medicine

## 2023-05-26 ENCOUNTER — Other Ambulatory Visit: Payer: Self-pay

## 2023-05-26 DIAGNOSIS — T451X5A Adverse effect of antineoplastic and immunosuppressive drugs, initial encounter: Secondary | ICD-10-CM

## 2023-05-27 ENCOUNTER — Other Ambulatory Visit: Payer: Self-pay

## 2023-05-31 ENCOUNTER — Other Ambulatory Visit

## 2023-05-31 ENCOUNTER — Inpatient Hospital Stay

## 2023-05-31 DIAGNOSIS — C3490 Malignant neoplasm of unspecified part of unspecified bronchus or lung: Secondary | ICD-10-CM

## 2023-05-31 DIAGNOSIS — Z5112 Encounter for antineoplastic immunotherapy: Secondary | ICD-10-CM | POA: Diagnosis not present

## 2023-05-31 DIAGNOSIS — C3491 Malignant neoplasm of unspecified part of right bronchus or lung: Secondary | ICD-10-CM

## 2023-05-31 DIAGNOSIS — D701 Agranulocytosis secondary to cancer chemotherapy: Secondary | ICD-10-CM

## 2023-05-31 DIAGNOSIS — T451X5A Adverse effect of antineoplastic and immunosuppressive drugs, initial encounter: Secondary | ICD-10-CM

## 2023-05-31 DIAGNOSIS — Z95828 Presence of other vascular implants and grafts: Secondary | ICD-10-CM

## 2023-05-31 LAB — CBC WITH DIFFERENTIAL (CANCER CENTER ONLY)
Abs Immature Granulocytes: 0.01 10*3/uL (ref 0.00–0.07)
Basophils Absolute: 0 10*3/uL (ref 0.0–0.1)
Basophils Relative: 0 %
Eosinophils Absolute: 0.1 10*3/uL (ref 0.0–0.5)
Eosinophils Relative: 3 %
HCT: 22.2 % — ABNORMAL LOW (ref 36.0–46.0)
Hemoglobin: 7.9 g/dL — ABNORMAL LOW (ref 12.0–15.0)
Immature Granulocytes: 0 %
Lymphocytes Relative: 24 %
Lymphs Abs: 0.6 10*3/uL — ABNORMAL LOW (ref 0.7–4.0)
MCH: 36.1 pg — ABNORMAL HIGH (ref 26.0–34.0)
MCHC: 35.6 g/dL (ref 30.0–36.0)
MCV: 101.4 fL — ABNORMAL HIGH (ref 80.0–100.0)
Monocytes Absolute: 0.1 10*3/uL (ref 0.1–1.0)
Monocytes Relative: 4 %
Neutro Abs: 1.6 10*3/uL — ABNORMAL LOW (ref 1.7–7.7)
Neutrophils Relative %: 69 %
Platelet Count: 92 10*3/uL — ABNORMAL LOW (ref 150–400)
RBC: 2.19 MIL/uL — ABNORMAL LOW (ref 3.87–5.11)
RDW: 12 % (ref 11.5–15.5)
Smear Review: NORMAL
WBC Count: 2.4 10*3/uL — ABNORMAL LOW (ref 4.0–10.5)
nRBC: 0 % (ref 0.0–0.2)

## 2023-05-31 LAB — CMP (CANCER CENTER ONLY)
ALT: 176 U/L — ABNORMAL HIGH (ref 0–44)
AST: 164 U/L — ABNORMAL HIGH (ref 15–41)
Albumin: 3.8 g/dL (ref 3.5–5.0)
Alkaline Phosphatase: 142 U/L — ABNORMAL HIGH (ref 38–126)
Anion gap: 6 (ref 5–15)
BUN: 14 mg/dL (ref 6–20)
CO2: 27 mmol/L (ref 22–32)
Calcium: 9.1 mg/dL (ref 8.9–10.3)
Chloride: 107 mmol/L (ref 98–111)
Creatinine: 0.81 mg/dL (ref 0.44–1.00)
GFR, Estimated: >60 mL/min (ref >60)
Glucose, Bld: 119 mg/dL — ABNORMAL HIGH (ref 70–99)
Potassium: 3.8 mmol/L (ref 3.5–5.1)
Sodium: 140 mmol/L (ref 135–145)
Total Bilirubin: 0.4 mg/dL (ref 0.0–1.2)
Total Protein: 7 g/dL (ref 6.5–8.1)

## 2023-05-31 MED ORDER — SODIUM CHLORIDE 0.9% FLUSH
10.0000 mL | Freq: Once | INTRAVENOUS | Status: AC
Start: 2023-05-31 — End: 2023-05-31
  Administered 2023-05-31: 10 mL

## 2023-05-31 MED ORDER — HEPARIN SOD (PORK) LOCK FLUSH 100 UNIT/ML IV SOLN
250.0000 [IU] | Freq: Once | INTRAVENOUS | Status: AC
  Administered 2023-05-31: 250 [IU]

## 2023-06-01 ENCOUNTER — Other Ambulatory Visit: Payer: Self-pay | Admitting: *Deleted

## 2023-06-01 ENCOUNTER — Telehealth: Payer: Self-pay | Admitting: *Deleted

## 2023-06-01 ENCOUNTER — Ambulatory Visit (HOSPITAL_BASED_OUTPATIENT_CLINIC_OR_DEPARTMENT_OTHER)
Admission: RE | Admit: 2023-06-01 | Discharge: 2023-06-01 | Disposition: A | Discharge status: Home / Self Care | Source: Ambulatory Visit | Attending: Internal Medicine | Admitting: Internal Medicine

## 2023-06-01 ENCOUNTER — Telehealth: Payer: Self-pay | Admitting: Internal Medicine

## 2023-06-01 ENCOUNTER — Other Ambulatory Visit

## 2023-06-01 DIAGNOSIS — C3490 Malignant neoplasm of unspecified part of unspecified bronchus or lung: Secondary | ICD-10-CM | POA: Insufficient documentation

## 2023-06-01 DIAGNOSIS — T451X5A Adverse effect of antineoplastic and immunosuppressive drugs, initial encounter: Secondary | ICD-10-CM

## 2023-06-01 DIAGNOSIS — D649 Anemia, unspecified: Secondary | ICD-10-CM

## 2023-06-01 DIAGNOSIS — C3491 Malignant neoplasm of unspecified part of right bronchus or lung: Secondary | ICD-10-CM

## 2023-06-01 MED ORDER — IOHEXOL 300 MG/ML  SOLN
100.0000 mL | Freq: Once | INTRAMUSCULAR | Status: AC | PRN
  Administered 2023-06-01: 85 mL via INTRAVENOUS

## 2023-06-01 NOTE — Telephone Encounter (Signed)
 Informed the patient of the port flush with lab that is scheduled for tomorrow. The patient is aware and confirmed appointment details.

## 2023-06-01 NOTE — Telephone Encounter (Signed)
 [name] in response to VM asking what Dr. Marguerita Shih thought about her labs. Patient added daughter to call. Pt is concerned that liver enzymes are elevated and that WBC is still down. She is very tired and does not feel well - states she feels her heart beating fast even when she is resting. She states that Dr. Marguerita Shih said she might need growth factor to stimulate her WBCs if they were low.  Dr. Marguerita Shih informed of patient concerns and asked that patient be informed: liver enzymes are up in response to treatment. They will be checked before next treatment and treatment dosage may be adjusted then. Her WBCs have stabilized and are a little higher than previous lab. Her hgb is low and she may feel fatigue due to that. Dr. Marguerita Shih recommends 1 unit PRBCs for HGB 7.9 if patient is agreeable.  Patient verbalized understanding of his response and states she would like to receive blood.  Blood transfusion tentatively scheduled for Saturday May 3 @ 10 am if earlier appt not available. Patient will have lab appt tomorrow for sample to send to [facility] - schedule msg sent.

## 2023-06-02 ENCOUNTER — Other Ambulatory Visit: Payer: Self-pay | Admitting: Medical Oncology

## 2023-06-02 ENCOUNTER — Inpatient Hospital Stay

## 2023-06-02 ENCOUNTER — Telehealth: Payer: Self-pay | Admitting: Internal Medicine

## 2023-06-02 ENCOUNTER — Other Ambulatory Visit: Payer: Self-pay

## 2023-06-02 ENCOUNTER — Inpatient Hospital Stay: Attending: Internal Medicine

## 2023-06-02 ENCOUNTER — Other Ambulatory Visit: Payer: Self-pay | Admitting: Physician Assistant

## 2023-06-02 ENCOUNTER — Telehealth: Payer: Self-pay

## 2023-06-02 VITALS — BP 99/70 | HR 98 | Temp 98.1°F | Resp 16 | Ht 62.0 in | Wt 106.2 lb

## 2023-06-02 DIAGNOSIS — C3411 Malignant neoplasm of upper lobe, right bronchus or lung: Secondary | ICD-10-CM | POA: Diagnosis present

## 2023-06-02 DIAGNOSIS — D649 Anemia, unspecified: Secondary | ICD-10-CM

## 2023-06-02 DIAGNOSIS — T451X5A Adverse effect of antineoplastic and immunosuppressive drugs, initial encounter: Secondary | ICD-10-CM

## 2023-06-02 DIAGNOSIS — Z95828 Presence of other vascular implants and grafts: Secondary | ICD-10-CM

## 2023-06-02 DIAGNOSIS — Z5112 Encounter for antineoplastic immunotherapy: Secondary | ICD-10-CM | POA: Diagnosis present

## 2023-06-02 DIAGNOSIS — C3491 Malignant neoplasm of unspecified part of right bronchus or lung: Secondary | ICD-10-CM

## 2023-06-02 DIAGNOSIS — Z5189 Encounter for other specified aftercare: Secondary | ICD-10-CM | POA: Diagnosis not present

## 2023-06-02 DIAGNOSIS — C787 Secondary malignant neoplasm of liver and intrahepatic bile duct: Secondary | ICD-10-CM | POA: Diagnosis not present

## 2023-06-02 DIAGNOSIS — C778 Secondary and unspecified malignant neoplasm of lymph nodes of multiple regions: Secondary | ICD-10-CM | POA: Diagnosis not present

## 2023-06-02 DIAGNOSIS — Z79620 Long term (current) use of immunosuppressive biologic: Secondary | ICD-10-CM | POA: Insufficient documentation

## 2023-06-02 DIAGNOSIS — Z5111 Encounter for antineoplastic chemotherapy: Secondary | ICD-10-CM | POA: Diagnosis present

## 2023-06-02 DIAGNOSIS — I519 Heart disease, unspecified: Secondary | ICD-10-CM | POA: Insufficient documentation

## 2023-06-02 DIAGNOSIS — C3490 Malignant neoplasm of unspecified part of unspecified bronchus or lung: Secondary | ICD-10-CM

## 2023-06-02 LAB — PREPARE RBC (CROSSMATCH)

## 2023-06-02 LAB — CMP (CANCER CENTER ONLY)
ALT: 147 U/L — ABNORMAL HIGH (ref 0–44)
AST: 99 U/L — ABNORMAL HIGH (ref 15–41)
Albumin: 3.6 g/dL (ref 3.5–5.0)
Alkaline Phosphatase: 167 U/L — ABNORMAL HIGH (ref 38–126)
Anion gap: 4 — ABNORMAL LOW (ref 5–15)
BUN: 11 mg/dL (ref 6–20)
CO2: 30 mmol/L (ref 22–32)
Calcium: 9.2 mg/dL (ref 8.9–10.3)
Chloride: 106 mmol/L (ref 98–111)
Creatinine: 0.93 mg/dL (ref 0.44–1.00)
GFR, Estimated: >60 mL/min (ref >60)
Glucose, Bld: 88 mg/dL (ref 70–99)
Potassium: 4 mmol/L (ref 3.5–5.1)
Sodium: 140 mmol/L (ref 135–145)
Total Bilirubin: 0.5 mg/dL (ref 0.0–1.2)
Total Protein: 6.8 g/dL (ref 6.5–8.1)

## 2023-06-02 LAB — CBC WITH DIFFERENTIAL (CANCER CENTER ONLY)
Abs Immature Granulocytes: 0.01 10*3/uL (ref 0.00–0.07)
Basophils Absolute: 0 10*3/uL (ref 0.0–0.1)
Basophils Relative: 0 %
Eosinophils Absolute: 0 10*3/uL (ref 0.0–0.5)
Eosinophils Relative: 2 %
HCT: 20.8 % — ABNORMAL LOW (ref 36.0–46.0)
Hemoglobin: 7.4 g/dL — ABNORMAL LOW (ref 12.0–15.0)
Immature Granulocytes: 1 %
Lymphocytes Relative: 37 %
Lymphs Abs: 0.4 10*3/uL — ABNORMAL LOW (ref 0.7–4.0)
MCH: 35.6 pg — ABNORMAL HIGH (ref 26.0–34.0)
MCHC: 35.6 g/dL (ref 30.0–36.0)
MCV: 100 fL (ref 80.0–100.0)
Monocytes Absolute: 0.2 10*3/uL (ref 0.1–1.0)
Monocytes Relative: 19 %
Neutro Abs: 0.4 10*3/uL — CL (ref 1.7–7.7)
Neutrophils Relative %: 41 %
Platelet Count: 59 10*3/uL — ABNORMAL LOW (ref 150–400)
RBC: 2.08 MIL/uL — ABNORMAL LOW (ref 3.87–5.11)
RDW: 11.7 % (ref 11.5–15.5)
Smear Review: NORMAL
WBC Count: 1.1 10*3/uL — ABNORMAL LOW (ref 4.0–10.5)
nRBC: 0 % (ref 0.0–0.2)

## 2023-06-02 LAB — SAMPLE TO BLOOD BANK

## 2023-06-02 MED ORDER — SODIUM CHLORIDE 0.9% FLUSH
10.0000 mL | Freq: Once | INTRAVENOUS | Status: AC
Start: 2023-06-02 — End: 2023-06-02
  Administered 2023-06-02: 10 mL

## 2023-06-02 MED ORDER — SODIUM CHLORIDE 0.9% IV SOLUTION
250.0000 mL | INTRAVENOUS | Status: DC
Start: 2023-06-02 — End: 2023-06-02
  Administered 2023-06-02: 100 mL via INTRAVENOUS

## 2023-06-02 MED ORDER — ONDANSETRON HCL 4 MG/2ML IJ SOLN
8.0000 mg | Freq: Once | INTRAMUSCULAR | Status: AC
  Administered 2023-06-02: 8 mg via INTRAVENOUS
  Filled 2023-06-02: qty 4

## 2023-06-02 MED ORDER — HEPARIN SOD (PORK) LOCK FLUSH 100 UNIT/ML IV SOLN
500.0000 [IU] | Freq: Every day | INTRAVENOUS | Status: AC | PRN
  Administered 2023-06-02: 500 [IU]

## 2023-06-02 MED ORDER — SODIUM CHLORIDE 0.9% FLUSH
10.0000 mL | INTRAVENOUS | Status: AC | PRN
  Administered 2023-06-02: 10 mL

## 2023-06-02 MED ORDER — ACETAMINOPHEN 325 MG PO TABS
650.0000 mg | ORAL_TABLET | Freq: Once | ORAL | Status: AC
  Administered 2023-06-02: 650 mg via ORAL
  Filled 2023-06-02: qty 2

## 2023-06-02 MED ORDER — FILGRASTIM-SNDZ 300 MCG/0.5ML IJ SOSY
300.0000 ug | PREFILLED_SYRINGE | Freq: Once | INTRAMUSCULAR | Status: AC
  Administered 2023-06-02: 300 ug via SUBCUTANEOUS
  Filled 2023-06-02: qty 0.5

## 2023-06-02 MED ORDER — DIPHENHYDRAMINE HCL 25 MG PO CAPS: 25.0000 mg | ORAL_CAPSULE | Freq: Once | ORAL | Status: DC

## 2023-06-02 NOTE — Progress Notes (Signed)
 Order released for t&S and prepare.

## 2023-06-02 NOTE — Progress Notes (Signed)
Blood transfusion orders entered.

## 2023-06-02 NOTE — Telephone Encounter (Signed)
 CRITICAL VALUE STICKER  CRITICAL VALUE: ANC 0.4  RECEIVER (on-site recipient of call): A Olivia Royse, RN  DATE & TIME NOTIFIED: 06/02/23 at 1217  MESSENGER (representative from lab):Jessica  MD NOTIFIED: C. Heilingoetter, PA and K. Walisiewicz, PA  TIME OF NOTIFICATION:06/02/23 at 1220  RESPONSE:

## 2023-06-02 NOTE — Patient Instructions (Signed)

## 2023-06-02 NOTE — Telephone Encounter (Signed)
 The patient is aware of the scheduled injections.

## 2023-06-03 ENCOUNTER — Encounter: Payer: Self-pay | Admitting: General Practice

## 2023-06-03 ENCOUNTER — Inpatient Hospital Stay

## 2023-06-03 ENCOUNTER — Other Ambulatory Visit: Payer: Self-pay

## 2023-06-03 VITALS — BP 108/64 | HR 104 | Temp 98.2°F | Resp 16

## 2023-06-03 DIAGNOSIS — D701 Agranulocytosis secondary to cancer chemotherapy: Secondary | ICD-10-CM

## 2023-06-03 DIAGNOSIS — Z5112 Encounter for antineoplastic immunotherapy: Secondary | ICD-10-CM | POA: Diagnosis not present

## 2023-06-03 DIAGNOSIS — Z95828 Presence of other vascular implants and grafts: Secondary | ICD-10-CM

## 2023-06-03 LAB — TYPE AND SCREEN
ABO/RH(D): B POS
Antibody Screen: NEGATIVE
Unit division: 0

## 2023-06-03 LAB — BPAM RBC
Blood Product Expiration Date: 202505272359
ISSUE DATE / TIME: 202505011151
Unit Type and Rh: 7300

## 2023-06-03 MED ORDER — FILGRASTIM-SNDZ 300 MCG/0.5ML IJ SOSY
300.0000 ug | PREFILLED_SYRINGE | Freq: Once | INTRAMUSCULAR | Status: AC
  Administered 2023-06-03: 300 ug via SUBCUTANEOUS
  Filled 2023-06-03: qty 0.5

## 2023-06-03 NOTE — Progress Notes (Signed)
 [facility] Spiritual Care Note  Referred by nursing for additional layer of support. Reached [name] by phone at an inconvenient time because her four-year-old granddaughter, whom [name] has had since she was born, was with [name] at the time. We plan to follow up in infusion on 06/15/2023 to meet in person and will make further support plans from there.    [address] [name], [place], [facility] Pager [phone] Voicemail [phone]

## 2023-06-04 ENCOUNTER — Inpatient Hospital Stay

## 2023-06-04 VITALS — BP 106/67 | HR 110 | Temp 97.6°F | Resp 17

## 2023-06-04 DIAGNOSIS — D701 Agranulocytosis secondary to cancer chemotherapy: Secondary | ICD-10-CM

## 2023-06-04 DIAGNOSIS — Z5112 Encounter for antineoplastic immunotherapy: Secondary | ICD-10-CM | POA: Diagnosis not present

## 2023-06-04 DIAGNOSIS — Z95828 Presence of other vascular implants and grafts: Secondary | ICD-10-CM

## 2023-06-04 MED ORDER — FILGRASTIM-SNDZ 300 MCG/0.5ML IJ SOSY
300.0000 ug | PREFILLED_SYRINGE | Freq: Once | INTRAMUSCULAR | Status: AC
Start: 2023-06-04 — End: 2023-06-04
  Administered 2023-06-04: 300 ug via SUBCUTANEOUS

## 2023-06-04 NOTE — Patient Instructions (Signed)
 Filgrastim Injection What is this medication? FILGRASTIM (fil GRA stim) lowers the risk of infection in people who are receiving chemotherapy. It works by Systems analyst make more white blood cells, which protects your body from infection. It may also be used to help people who have been exposed to high doses of radiation. It can be used to help prepare your body before a stem cell transplant. It works by helping your bone marrow make and release stem cells into the blood. This medicine may be used for other purposes; ask your health care provider or pharmacist if you have questions. COMMON BRAND NAME(S): Neupogen, Nivestym, Nypozi, Releuko, Zarxio What should I tell my care team before I take this medication? They need to know if you have any of these conditions: History of blood diseases, such as sickle cell anemia Kidney disease Recent or ongoing radiation An unusual or allergic reaction to filgrastim, pegfilgrastim, latex, rubber, other medications, foods, dyes, or preservatives Pregnant or trying to get pregnant Breast-feeding How should I use this medication? This medication is injected under the skin or into a vein. It is usually given by your care team in a hospital or clinic setting. It may be given at home. If you get this medication at home, you will be taught how to prepare and give it. Use exactly as directed. Take it as directed on the prescription label at the same time every day. Keep taking it unless your care team tells you to stop. It is important that you put your used needles and syringes in a special sharps container. Do not put them in a trash can. If you do not have a sharps container, call your pharmacist or care team to get one. This medication comes with INSTRUCTIONS FOR USE. Ask your pharmacist for directions on how to use this medication. Read the information carefully. Talk to your pharmacist or care team if you have questions. Talk to your care team about the use of  this medication in children. While it may be prescribed for children for selected conditions, precautions do apply. Overdosage: If you think you have taken too much of this medicine contact a poison control center or emergency room at once. NOTE: This medicine is only for you. Do not share this medicine with others. What if I miss a dose? It is important not to miss any doses. Talk to your care team about what to do if you miss a dose. What may interact with this medication? Medications that may cause a release of neutrophils, such as lithium This list may not describe all possible interactions. Give your health care provider a list of all the medicines, herbs, non-prescription drugs, or dietary supplements you use. Also tell them if you smoke, drink alcohol, or use illegal drugs. Some items may interact with your medicine. What should I watch for while using this medication? Your condition will be monitored carefully while you are receiving this medication. You may need bloodwork while taking this medication. Talk to your care team about your risk of cancer. You may be more at risk for certain types of cancer if you take this medication. What side effects may I notice from receiving this medication? Side effects that you should report to your care team as soon as possible: Allergic reactions--skin rash, itching, hives, swelling of the face, lips, tongue, or throat Capillary leak syndrome--stomach or muscle pain, unusual weakness or fatigue, feeling faint or lightheaded, decrease in the amount of urine, swelling of the ankles, hands,  or feet, trouble breathing High white blood cell level--fever, fatigue, trouble breathing, night sweats, change in vision, weight loss Inflammation of the aorta--fever, fatigue, back, chest, or stomach pain, severe headache Kidney injury (glomerulonephritis)--decrease in the amount of urine, red or dark brown urine, foamy or bubbly urine, swelling of the ankles, hands,  or feet Shortness of breath or trouble breathing Spleen injury--pain in upper left stomach or shoulder Unusual bruising or bleeding Side effects that usually do not require medical attention (report to your care team if they continue or are bothersome): Back pain Bone pain Fatigue Fever Headache Nausea This list may not describe all possible side effects. Call your doctor for medical advice about side effects. You may report side effects to FDA at 1-800-FDA-1088. Where should I keep my medication? Keep out of the reach of children and pets. Keep this medication in the original packaging until you are ready to take it. Protect from light. See product for storage information. Each product may have different instructions. Get rid of any unused medication after the expiration date. To get rid of medications that are no longer needed or have expired: Take the medication to a medications take-back program. Check with your pharmacy or law enforcement to find a location. If you cannot return the medication, ask your pharmacist or care team how to get rid of this medication safely. NOTE: This sheet is a summary. It may not cover all possible information. If you have questions about this medicine, talk to your doctor, pharmacist, or health care provider.  2024 Elsevier/Gold Standard (2021-06-11 00:00:00)

## 2023-06-08 ENCOUNTER — Encounter: Payer: Self-pay | Admitting: Emergency Medicine

## 2023-06-08 ENCOUNTER — Ambulatory Visit (INDEPENDENT_AMBULATORY_CARE_PROVIDER_SITE_OTHER): Admitting: Emergency Medicine

## 2023-06-08 ENCOUNTER — Other Ambulatory Visit: Payer: Self-pay | Admitting: Medical Oncology

## 2023-06-08 ENCOUNTER — Telehealth: Payer: Self-pay | Admitting: Medical Oncology

## 2023-06-08 VITALS — BP 118/66 | HR 101 | Ht 62.0 in | Wt 107.6 lb

## 2023-06-08 DIAGNOSIS — R0602 Shortness of breath: Secondary | ICD-10-CM | POA: Diagnosis not present

## 2023-06-08 DIAGNOSIS — C3491 Malignant neoplasm of unspecified part of right bronchus or lung: Secondary | ICD-10-CM | POA: Diagnosis not present

## 2023-06-08 DIAGNOSIS — J449 Chronic obstructive pulmonary disease, unspecified: Secondary | ICD-10-CM | POA: Diagnosis not present

## 2023-06-08 NOTE — Progress Notes (Unsigned)
CT results requested 

## 2023-06-08 NOTE — Assessment & Plan Note (Signed)
 Followed by Dr. Marguerita Shih.  She had a reassuring CT scan of the chest, abdomen, pelvis that showed an enlarged retroperitoneal lymph node is now smaller, 9 x 5 mm.  I reassured her about this.

## 2023-06-08 NOTE — Patient Instructions (Addendum)
 Please continue your Stiolto 2 puffs once daily. Keep albuterol  available to use 2 puffs or 1 nebulizer treatment up to every 4 hours if needed for shortness of breath, chest tightness, wheezing.  We will perform an echocardiogram given your progressive short windedness and your leg swelling. We reviewed your CT scan of the chest, abdomen, pelvis today.  The scan was stable and a left retroperitoneal lymph node was smaller (good news) Please follow-up in our office in about 1 month to review your echocardiogram.

## 2023-06-08 NOTE — Telephone Encounter (Addendum)
 Asking about CT results. 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

## 2023-06-08 NOTE — Progress Notes (Unsigned)
   Subjective:    Patient ID: Julie Nicholson, female    DOB: 1965/10/06, 58 y.o.   MRN: [id number]  HPI  ROV 06/08/2023 --Julie Nicholson is 58 with a history of COPD, stage IV adenocarcinoma of the lung followed by Dr. Marguerita Shih and treated with palliative chemotherapy and immunotherapy.  She had a retroperitoneal lymph node on CT scan of the chest abdomen pelvis in March, repeat performed 06/01/2023 as below.  I saw her in March at which time she was having more dyspnea, question due to active allergies.  We added fluticasone  back to her loratadine .  She has been managed on Stiolto.  Today she reports that she is experiencing increased LE edema over the last month, was started on lasix. She gets SOB w chores, a bit more than before, started over about 1 month. No CP. She has mucous in the am - clear. 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   CT scan of the chest/abdomen/pelvis 06/01/23 reviewed by me showed no new findings in the mediastinum or right hilum where she has undergone treatment.  Severe emphysema with bronchial wall thickening, unchanged posttreatment changes with some bandlike radiation fibrosis and bronchiectasis.  The left retroperitoneal lymph node is smaller, now 9 x 5 mm.   Review of Systems As per HPI      Objective:   Physical Exam Vitals:   06/08/23 1414  BP: 118/66  Pulse: (!) 101  SpO2: 98%  Weight: 107 lb 9.6 oz (48.8 kg)  Height: 5\' 2"  (1.575 m)     Gen: Pleasant, well-nourished, in no distress,  normal affect  ENT: No lesions,  mouth clear,  oropharynx clear, no postnasal drip  Neck: No JVD, no stridor  Lungs: No use of accessory muscles, no wheeze or crackles, good air movement  Cardiovascular: RRR, heart sounds normal, no murmur or gallops, no peripheral edema  Musculoskeletal: No deformities, no cyanosis or clubbing  Neuro: alert, awake, non focal  Skin: Warm, no lesions or rash       Assessment & Plan:   COPD (chronic  obstructive pulmonary disease) (HCC) She does have significant COPD with some progressive dyspnea.  No wheezing today, no flares.  She has not seen any desaturations on room air.  Before we start her on any other inhaled therapy I think it would be reasonable to rule out other causes especially given the lower extremity edema.  She is at risk for cardiac side effects on chemotherapy.  Will perform an echocardiogram and then have her back to review.  Adenocarcinoma of right lung, stage 4 (HCC) Followed by Dr. Marguerita Shih.  She had a reassuring CT scan of the chest, abdomen, pelvis that showed an enlarged retroperitoneal lymph node is now smaller, 9 x 5 mm.  I reassured her about this.   Time spent 31 minutes   Julie Buddle, MD, PhD 06/08/2023, 2:31 PM [facility] [phone] or if no answer before 7:00PM call [phone] For any issues after 7:00PM please call eLink [phone]

## 2023-06-08 NOTE — Assessment & Plan Note (Signed)
 She does have significant COPD with some progressive dyspnea.  No wheezing today, no flares.  She has not seen any desaturations on room air.  Before we start her on any other inhaled therapy I think it would be reasonable to rule out other causes especially given the lower extremity edema.  She is at risk for cardiac side effects on chemotherapy.  Will perform an echocardiogram and then have her back to review.

## 2023-06-10 ENCOUNTER — Ambulatory Visit (HOSPITAL_COMMUNITY)
Admission: RE | Admit: 2023-06-10 | Discharge: 2023-06-10 | Disposition: A | Discharge status: Home / Self Care | Source: Ambulatory Visit | Attending: Emergency Medicine | Admitting: Emergency Medicine

## 2023-06-10 DIAGNOSIS — I082 Rheumatic disorders of both aortic and tricuspid valves: Secondary | ICD-10-CM | POA: Diagnosis not present

## 2023-06-10 DIAGNOSIS — J449 Chronic obstructive pulmonary disease, unspecified: Secondary | ICD-10-CM | POA: Insufficient documentation

## 2023-06-10 DIAGNOSIS — I5021 Acute systolic (congestive) heart failure: Secondary | ICD-10-CM | POA: Diagnosis not present

## 2023-06-10 DIAGNOSIS — R0602 Shortness of breath: Secondary | ICD-10-CM | POA: Diagnosis present

## 2023-06-10 LAB — ECHOCARDIOGRAM COMPLETE
AR max vel: 1.98 cm2
AV Area VTI: 2.22 cm2
AV Area mean vel: 1.96 cm2
AV Mean grad: 2 mmHg
AV Peak grad: 5.2 mmHg
Ao pk vel: 1.14 m/s
Area-P 1/2: 6.17 cm2
Calc EF: 41.2 %
S' Lateral: 3.6 cm
Single Plane A2C EF: 42.3 %
Single Plane A4C EF: 40.3 %

## 2023-06-10 NOTE — Progress Notes (Signed)
*  PRELIMINARY RESULTS* Echocardiogram 2D Echocardiogram has been performed.  Julie Nicholson 06/10/2023, 10:57 AM

## 2023-06-15 ENCOUNTER — Encounter: Payer: Self-pay | Admitting: General Practice

## 2023-06-15 ENCOUNTER — Inpatient Hospital Stay: Payer: Commercial Managed Care - PPO

## 2023-06-15 ENCOUNTER — Encounter: Payer: Self-pay | Admitting: Internal Medicine

## 2023-06-15 ENCOUNTER — Inpatient Hospital Stay (HOSPITAL_BASED_OUTPATIENT_CLINIC_OR_DEPARTMENT_OTHER): Payer: Commercial Managed Care - PPO | Admitting: Internal Medicine

## 2023-06-15 ENCOUNTER — Encounter: Payer: Self-pay | Admitting: Physician Assistant

## 2023-06-15 ENCOUNTER — Other Ambulatory Visit: Payer: Self-pay | Admitting: Internal Medicine

## 2023-06-15 VITALS — BP 109/61 | HR 107 | Temp 97.3°F | Resp 18 | Ht 62.0 in | Wt 105.0 lb

## 2023-06-15 DIAGNOSIS — C3491 Malignant neoplasm of unspecified part of right bronchus or lung: Secondary | ICD-10-CM

## 2023-06-15 DIAGNOSIS — Z95828 Presence of other vascular implants and grafts: Secondary | ICD-10-CM

## 2023-06-15 DIAGNOSIS — Z5112 Encounter for antineoplastic immunotherapy: Secondary | ICD-10-CM | POA: Diagnosis not present

## 2023-06-15 DIAGNOSIS — T451X5A Adverse effect of antineoplastic and immunosuppressive drugs, initial encounter: Secondary | ICD-10-CM

## 2023-06-15 DIAGNOSIS — C3411 Malignant neoplasm of upper lobe, right bronchus or lung: Secondary | ICD-10-CM

## 2023-06-15 LAB — CBC WITH DIFFERENTIAL (CANCER CENTER ONLY)
Abs Immature Granulocytes: 0.01 10*3/uL (ref 0.00–0.07)
Basophils Absolute: 0 10*3/uL (ref 0.0–0.1)
Basophils Relative: 0 %
Eosinophils Absolute: 0.1 10*3/uL (ref 0.0–0.5)
Eosinophils Relative: 4 %
HCT: 30.3 % — ABNORMAL LOW (ref 36.0–46.0)
Hemoglobin: 10.2 g/dL — ABNORMAL LOW (ref 12.0–15.0)
Immature Granulocytes: 0 %
Lymphocytes Relative: 21 %
Lymphs Abs: 0.6 10*3/uL — ABNORMAL LOW (ref 0.7–4.0)
MCH: 34.3 pg — ABNORMAL HIGH (ref 26.0–34.0)
MCHC: 33.7 g/dL (ref 30.0–36.0)
MCV: 102 fL — ABNORMAL HIGH (ref 80.0–100.0)
Monocytes Absolute: 0.4 10*3/uL (ref 0.1–1.0)
Monocytes Relative: 14 %
Neutro Abs: 1.8 10*3/uL (ref 1.7–7.7)
Neutrophils Relative %: 61 %
Platelet Count: 187 10*3/uL (ref 150–400)
RBC: 2.97 MIL/uL — ABNORMAL LOW (ref 3.87–5.11)
RDW: 18 % — ABNORMAL HIGH (ref 11.5–15.5)
WBC Count: 3 10*3/uL — ABNORMAL LOW (ref 4.0–10.5)
nRBC: 0 % (ref 0.0–0.2)

## 2023-06-15 LAB — CMP (CANCER CENTER ONLY)
ALT: 24 U/L (ref 0–44)
AST: 22 U/L (ref 15–41)
Albumin: 3.8 g/dL (ref 3.5–5.0)
Alkaline Phosphatase: 134 U/L — ABNORMAL HIGH (ref 38–126)
Anion gap: 6 (ref 5–15)
BUN: 13 mg/dL (ref 6–20)
CO2: 30 mmol/L (ref 22–32)
Calcium: 9.5 mg/dL (ref 8.9–10.3)
Chloride: 106 mmol/L (ref 98–111)
Creatinine: 0.96 mg/dL (ref 0.44–1.00)
GFR, Estimated: >60 mL/min (ref >60)
Glucose, Bld: 106 mg/dL — ABNORMAL HIGH (ref 70–99)
Potassium: 3.6 mmol/L (ref 3.5–5.1)
Sodium: 142 mmol/L (ref 135–145)
Total Bilirubin: 0.5 mg/dL (ref 0.0–1.2)
Total Protein: 7 g/dL (ref 6.5–8.1)

## 2023-06-15 MED ORDER — HEPARIN SOD (PORK) LOCK FLUSH 100 UNIT/ML IV SOLN
500.0000 [IU] | Freq: Once | INTRAVENOUS | Status: AC | PRN
  Administered 2023-06-15: 500 [IU]

## 2023-06-15 MED ORDER — SODIUM CHLORIDE 0.9% FLUSH
10.0000 mL | INTRAVENOUS | Status: DC | PRN
  Administered 2023-06-15: 10 mL

## 2023-06-15 MED ORDER — ONDANSETRON HCL 4 MG/2ML IJ SOLN
8.0000 mg | Freq: Once | INTRAMUSCULAR | Status: AC
  Administered 2023-06-15: 8 mg via INTRAVENOUS
  Filled 2023-06-15: qty 4

## 2023-06-15 MED ORDER — SODIUM CHLORIDE 0.9 % IV SOLN
400.0000 mg/m2 | Freq: Once | INTRAVENOUS | Status: AC
  Administered 2023-06-15: 600 mg via INTRAVENOUS
  Filled 2023-06-15: qty 20

## 2023-06-15 MED ORDER — SODIUM CHLORIDE 0.9% FLUSH
10.0000 mL | Freq: Once | INTRAVENOUS | Status: AC
  Administered 2023-06-15: 10 mL

## 2023-06-15 MED ORDER — CYANOCOBALAMIN 1000 MCG/ML IJ SOLN
1000.0000 ug | Freq: Once | INTRAMUSCULAR | Status: AC
  Administered 2023-06-15: 1000 ug via INTRAMUSCULAR
  Filled 2023-06-15: qty 1

## 2023-06-15 MED ORDER — SODIUM CHLORIDE 0.9 % IV SOLN
200.0000 mg | Freq: Once | INTRAVENOUS | Status: AC
  Administered 2023-06-15: 200 mg via INTRAVENOUS
  Filled 2023-06-15: qty 200

## 2023-06-15 MED ORDER — SODIUM CHLORIDE 0.9 % IV SOLN
Freq: Once | INTRAVENOUS | Status: AC
Start: 2023-06-15 — End: 2023-06-15

## 2023-06-15 NOTE — Progress Notes (Signed)
 Ok to proceed with tx today while pt completes Medrol  dosepak per Dr. Marguerita Shih.  Onna Nodal, PharmD, MBA

## 2023-06-15 NOTE — Progress Notes (Signed)
 [facility] Spiritual Care Note  Missed Julie Nicholson in infusion. Left follow-up voicemail with direct number and plan to visit at her treatment on 07/06/2023.   [address] [name], [place], [facility] Pager [phone] Voicemail [phone]

## 2023-06-15 NOTE — Progress Notes (Signed)
 [facility] Telephone:(336) [phone]   Fax:(336) [phone]  OFFICE PROGRESS NOTE  Marlene Simas, MD [address] [place] [address]  DIAGNOSIS: Recurrent/metastatic non-small cell lung cancer initially diagnosed as stage IIIB  (T1b, N3, M0) non-small cell lung cancer, adenocarcinoma she presented with right upper lobe nodule in addition to bulky right hilar, mediastinal, and left supraclavicular lymphadenopathy diagnosed in November 2022.  The patient had evidence for disease recurrence in the mediastinal and right supraclavicular lymphadenopathy in August 2023.  The patient had evidence of metastatic disease in February 2024 with several supraclavicular, thoracic and precarinal lymphadenopathy as well as upper abdominal lymph nodes and small liver lesion.  DETECTED ALTERATION(S) / BIOMARKER(S) % CFDNA OR AMPLIFICATION ASSOCIATED FDA-APPROVED THERAPIES CLINICAL TRIAL AVAILABILITY [id number] ND 0.5 5 50 100 4.7%  RHOAG17E ND 0.5 5 50 100 1.8%  CTNNB1S37C ND 0.5 5 50 100 1.9%  BIOMARKER ADDITIONAL DETAILS Tumor Mutational Burden (TMB) 19.02 mut/Mb MSI Status Stable (MSS) PD-L1 Tumor Proportion Score (TPS)* <1%  Biomarker Findings By [name] on 05/06/2022 Tumor Mutational Burden - 17 Muts/Mb Microsatellite status - MS-Stable Genomic Findings For a complete list of the genes assayed, please refer to the Appendix. CTNNB1 S37C CRKL amplification MAP2K4 loss exons 2-11 TP53 V143A 8 Disease relevant genes with no reportable alterations: ALK, BRAF, EGFR, ERBB2, KRAS, MET, RET, ROS1  PDL1 0%   PRIOR THERAPY:  1) Concurrent chemoradiation with carboplatin  for an AUC of 2 and paclitaxel  45 mg per metered squared.  First dose on 01/05/2021.  Status post 7 cycles of treatment.  Last dose was given February 16, 2021. 2) Consolidation immunotherapy with Imfinzi  1500 Mg IV every 4 weeks.  First dose March 25, 2021.  Status post 3 cycles.  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 3) Palliative radiotherapy to the enlarging right supraclavicular lymphadenopathy under the care of Dr. Eloise Hake expected to be completed on October 28, 2021.   CURRENT THERAPY: Palliative systemic chemotherapy with carboplatin  for AUC of 5, Alimta 500 Mg/M2 and Keytruda  200 Mg IV every 3 weeks.  First dose April 05, 2022.  Status post 20 cycles.  Starting from cycle #3 her dose of carboplatin  was reduced to AUC of 4 and Alimta 400 Mg/M2.  Starting from cycle #5 the patient will be on maintenance treatment with Alimta and Keytruda  every 3 weeks.  INTERVAL HISTORY: YAZMIN MCKAUGHAN 58 y.o. female returns to the clinic today for follow-up visit. Discussed the use of AI scribe software for clinical note transcription with the patient, who gave verbal consent to proceed.  History of Present Illness   JACQUELYN ENGEBRETSON "[name]" is a 58 year old female with metastatic non-small cell lung cancer who presents for evaluation and discussion of recent CT scan results.  Initially diagnosed with stage III B non-small cell lung cancer in November 2022, she experienced disease progression and metastasis in August 2023 and February 2024. She is currently on maintenance treatment with Alimta and Ketruda every three weeks and is here to discuss her recent CT scan results before starting cycle number 21.  She is experiencing shortness of breath, numbness in her right arm and leg, and swelling around her ankles. An echocardiogram was performed, revealing a left ventricular ejection fraction of 40 to 45 percent. This is the first time this test has been performed for her, so no baseline is available for comparison.  She is actively involved in her daughter's life, who is expecting  a baby soon, and she is planning a baby shower this weekend.        MEDICAL HISTORY: Past Medical History:  Diagnosis Date   Abdominal discomfort    Cancer (HCC)     Chronic headaches    due to allergies, sinus   COPD (chronic obstructive pulmonary disease) (HCC)    per 2012 chest xray   pt states she doesn not have this now (04/10/2013)   Deviated nasal septum    Eustachian tube dysfunction    GERD (gastroesophageal reflux disease)    occasional uses Tums / Rolaids   Hearing loss    right ear   High cholesterol    History of radiation therapy    right lung 01/07/2021-02/19/2021  Dr Retta Caster   History of radiation therapy    Lymph nodes of head,face,neck- 12/23/22-01/07/23- Dr. Retta Caster   Migraine    "only once in a blue moon since RX'd allergy  shots" (04/10/2013)   Pancreatitis 02/08/2013   Pneumonia    Rhinitis, allergic     ALLERGIES:  is allergic to carboplatin .  MEDICATIONS:  Current Outpatient Medications  Medication Sig Dispense Refill   acetaminophen  (TYLENOL ) 500 MG tablet Take 1,000 mg by mouth every 6 (six) hours as needed for moderate pain.     albuterol  (PROVENTIL ) (2.5 MG/3ML) 0.083% nebulizer solution albuterol  sulfate 2.5 mg/3 mL (0.083 %) solution for nebulization  USE 1 VIAL IN NEBULIZER EVERY 6 HOURS AS NEEDED FOR WHEEZING OR SHORTNESS OF BREATH     albuterol  (VENTOLIN  HFA) 108 (90 Base) MCG/ACT inhaler Inhale 1-2 puffs into the lungs every 6 (six) hours as needed for wheezing or shortness of breath. 18 g 3   aspirin EC 81 MG tablet Take 81 mg by mouth at bedtime.     atorvastatin  (LIPITOR) 40 MG tablet Take 40 mg by mouth at bedtime.     azelastine  (ASTELIN ) 0.1 % nasal spray Place 1 spray into both nostrils 2 (two) times daily as needed. Use in each nostril as directed 30 mL 5   benzonatate  (TESSALON ) 200 MG capsule Take 1 capsule (200 mg total) by mouth 3 (three) times daily as needed. 30 capsule 0   Biotin 5000 MCG TABS Take 5,000 mcg by mouth at bedtime.     buPROPion  (WELLBUTRIN  XL) 150 MG 24 hr tablet Take 150 mg by mouth every morning.     Ca Carbonate-Mag Hydroxide (ROLAIDS PO) Take 1 tablet by mouth daily  as needed (heartburn).     cetirizine  (ZYRTEC  ALLERGY ) 10 MG tablet Take 1 tablet (10 mg total) by mouth daily as needed for rhinitis. 30 tablet 5   famotidine  (PEPCID ) 20 MG tablet Take 1 tablet by mouth twice daily 60 tablet 0   fluticasone  (FLONASE ) 50 MCG/ACT nasal spray Place 2 sprays into both nostrils daily. 16 g 5   folic acid  (FOLVITE ) 1 MG tablet Take 1 tablet by mouth once daily 30 tablet 0   furosemide (LASIX) 20 MG tablet Take 20 mg by mouth daily.     HYDROcodone  bit-homatropine (HYCODAN) 5-1.5 MG/5ML syrup Take 5 mLs by mouth every 6 (six) hours as needed for cough. 120 mL 0   hydrocortisone  1 % ointment Apply 1 Application topically 2 (two) times daily. (Patient not taking: Reported on 06/08/2023) 30 g 0   lidocaine -prilocaine  (EMLA ) cream APPLY TO PORT-A-CATH 30 TO 60 MINUTES BEFORE TREATMENT 30 g 0   linaCLOtide (LINZESS PO) Take by mouth.     loratadine  (CLARITIN ) 10 MG  tablet Take 10 mg by mouth daily.     methylPREDNISolone  (MEDROL ) 4 MG tablet Take 4 mg by mouth daily.     Multiple Vitamins-Minerals (HAIR SKIN AND NAILS FORMULA PO) Take 1 tablet by mouth daily.     ondansetron  (ZOFRAN -ODT) 8 MG disintegrating tablet DISSOLVE 1 TABLET IN MOUTH THREE TIMES DAILY AS NEEDED 20 tablet 0   Oxycodone  HCl 10 MG TABS Take 1 tablet by mouth 3 (three) times daily as needed (pain).     PARoxetine (PAXIL) 10 MG tablet Take 10 mg by mouth daily.     prochlorperazine  (COMPAZINE ) 10 MG tablet Take 1 tablet (10 mg total) by mouth every 6 (six) hours as needed. 30 tablet 2   rizatriptan (MAXALT-MLT) 10 MG disintegrating tablet Take 10 mg by mouth as needed for migraine. May repeat in 2 hours if needed     sertraline (ZOLOFT) 50 MG tablet Take 1 tablet by mouth daily.     Tiotropium Bromide-Olodaterol (STIOLTO RESPIMAT ) 2.5-2.5 MCG/ACT AERS Inhale 2 puffs into the lungs daily. 12 g 3   No current facility-administered medications for this visit.    SURGICAL HISTORY:  Past Surgical History:   Procedure Laterality Date   ABDOMINAL HYSTERECTOMY  1995   tx endometriosis, both ovaries removed   ADENOIDECTOMY     APPENDECTOMY  late 1990's   BIOPSY  02/26/2021   Procedure: BIOPSY;  Surgeon: Janel Medford, MD;  Location: [facility] ENDOSCOPY;  Service: Endoscopy;;   BRONCHIAL BRUSHINGS  12/15/2020   Procedure: BRONCHIAL BRUSHINGS;  Surgeon: Denson Flake, MD;  Location: [facility] ENDOSCOPY;  Service: Cardiopulmonary;;   BRONCHIAL NEEDLE ASPIRATION BIOPSY  12/15/2020   Procedure: BRONCHIAL NEEDLE ASPIRATION BIOPSIES;  Surgeon: Denson Flake, MD;  Location: [facility] ENDOSCOPY;  Service: Cardiopulmonary;;   CHOLECYSTECTOMY  04/10/2013   CHOLECYSTECTOMY N/A 04/10/2013   Procedure: LAPAROSCOPIC CHOLECYSTECTOMY WITH INTRAOPERATIVE CHOLANGIOGRAM;  Surgeon: Harlee Lichtenstein, MD;  Location: [facility] OR;  Service: General;  Laterality: N/A;   ELECTROMAGNETIC NAVIGATION BROCHOSCOPY  12/15/2020   Procedure: ELECTROMAGNETIC NAVIGATION BRONCHOSCOPY;  Surgeon: Denson Flake, MD;  Location: [facility] ENDOSCOPY;  Service: Cardiopulmonary;;   ESOPHAGOGASTRODUODENOSCOPY (EGD) WITH PROPOFOL  N/A 02/26/2021   Procedure: ESOPHAGOGASTRODUODENOSCOPY (EGD) WITH PROPOFOL ;  Surgeon: Janel Medford, MD;  Location: [facility] ENDOSCOPY;  Service: Endoscopy;  Laterality: N/A;   EUS N/A 02/16/2013   Procedure: UPPER ENDOSCOPIC ULTRASOUND (EUS) LINEAR;  Surgeon: Janel Medford, MD;  Location: [facility] ENDOSCOPY;  Service: Endoscopy;  Laterality: N/A;  radial linear   IR IMAGING GUIDED PORT INSERTION  06/30/2022   KNEE ARTHROSCOPY Right 1980's   "cartilage OR"   LAPAROSCOPIC ENDOMETRIOSIS FULGURATION  1980's   MYRINGOTOMY WITH TUBE PLACEMENT Right 07/13/2018   Procedure: MYRINGOTOMY WITH TUBE PLACEMENT;  Surgeon: Mellody Sprout, MD;  Location: [facility];  Service: ENT;  Laterality: Right;   MYRINGOTOMY WITH TUBE PLACEMENT Right 06/04/2021   Procedure: MYRINGOTOMY WITH BUTTERFLY TUBE PLACEMENT;  Surgeon: Mellody Sprout, MD;  Location: [facility]  SURGERY CNTR;  Service: ENT;  Laterality: Right;   NASOPHARYNGOSCOPY EUSTATION TUBE BALLOON DILATION Right 07/13/2018   Procedure: NASOPHARYNGOSCOPY EUSTATION TUBE BALLOON DILATION;  Surgeon: Mellody Sprout, MD;  Location: [facility];  Service: ENT;  Laterality: Right;   TONSILLECTOMY AND ADENOIDECTOMY  ~ 1980   adenoidectomy   TUBAL LIGATION  ~ 1987   TURBINATE REDUCTION Right 07/13/2018   Procedure: OUTFRACTURE TURBINATE;  Surgeon: Mellody Sprout, MD;  Location: [facility];  Service: ENT;  Laterality: Right;   TYMPANOSTOMY TUBE PLACEMENT  VIDEO BRONCHOSCOPY WITH ENDOBRONCHIAL ULTRASOUND N/A 12/15/2020   Procedure: ROBOTIC VIDEO BRONCHOSCOPY WITH ENDOBRONCHIAL ULTRASOUND;  Surgeon: Denson Flake, MD;  Location: [facility] ENDOSCOPY;  Service: Cardiopulmonary;  Laterality: N/A;   WRIST SURGERY Left    w/plate    REVIEW OF SYSTEMS:  Constitutional: positive for fatigue Eyes: negative Ears, nose, mouth, throat, and face: negative Respiratory: negative Cardiovascular: negative Gastrointestinal: negative Genitourinary:negative Integument/breast: negative Hematologic/lymphatic: negative Musculoskeletal:negative Neurological: negative Behavioral/Psych: negative Endocrine: negative Allergic/Immunologic: negative   PHYSICAL EXAMINATION: General appearance: alert, cooperative, fatigued, and no distress Head: Normocephalic, without obvious abnormality, atraumatic Neck: moderate anterior cervical adenopathy, no JVD, supple, symmetrical, trachea midline, and thyroid  not enlarged, symmetric, no tenderness/mass/nodules Lymph nodes: Cervical, supraclavicular, and axillary nodes normal. Resp: clear to auscultation bilaterally Back: symmetric, no curvature. ROM normal. No CVA tenderness. Cardio: regular rate and rhythm, S1, S2 normal, no murmur, click, rub or gallop GI: soft, non-tender; bowel sounds normal; no masses,  no organomegaly Extremities: extremities normal, atraumatic, no  cyanosis or edema Neurologic: Alert and oriented X 3, normal strength and tone. Normal symmetric reflexes. Normal coordination and gait  ECOG PERFORMANCE STATUS: 1 - Symptomatic but completely ambulatory  Blood pressure 109/61, pulse (!) 107, temperature (!) 97.3 F (36.3 C), temperature source Temporal, resp. rate 18, height 5\' 2"  (1.575 m), weight 105 lb (47.6 kg), SpO2 99%.  LABORATORY DATA: Lab Results  Component Value Date   WBC 3.0 (L) 06/15/2023   HGB 10.2 (L) 06/15/2023   HCT 30.3 (L) 06/15/2023   MCV 102.0 (H) 06/15/2023   PLT 187 06/15/2023      Chemistry      Component Value Date/Time   NA 140 06/02/2023 1013   K 4.0 06/02/2023 1013   CL 106 06/02/2023 1013   CO2 30 06/02/2023 1013   BUN 11 06/02/2023 1013   CREATININE 0.93 06/02/2023 1013      Component Value Date/Time   CALCIUM  9.2 06/02/2023 1013   ALKPHOS 167 (H) 06/02/2023 1013   AST 99 (H) 06/02/2023 1013   ALT 147 (H) 06/02/2023 1013   BILITOT 0.5 06/02/2023 1013       RADIOGRAPHIC STUDIES: ECHOCARDIOGRAM COMPLETE Result Date: 06/10/2023    ECHOCARDIOGRAM REPORT   Patient Name:   PAVNEET LISBOA Neto Date of Exam: 06/10/2023 Medical Rec #:  [id number]        Height:       62.0 in Accession #:    [id number]       Weight:       107.6 lb Date of Birth:  [birth year]        BSA:          1.469 m Patient Age:    57 years         BP:           112/72 mmHg Patient Gender: F                HR:           95 bpm. Exam Location:  Outpatient Procedure: 2D Echo, Cardiac Doppler and Color Doppler (Both Spectral and Color            Flow Doppler were utilized during procedure). Indications:    I50.21 Acute systolic (congestive) heart failure  History:        Patient has no prior history of Echocardiogram examinations.                 COPD.  Sonographer:    Andrena Bang  Referring Phys: [id number] ROBERT S [name] IMPRESSIONS  1. Left ventricular ejection fraction, by estimation, is 40 to 45%. The left ventricle has mildly decreased function.  The left ventricle demonstrates global hypokinesis. The left ventricular internal cavity size was mildly dilated. Left ventricular diastolic parameters are consistent with Grade I diastolic dysfunction (impaired relaxation).  2. Right ventricular systolic function is normal. The right ventricular size is normal.  3. The mitral valve is abnormal. Trivial mitral valve regurgitation. No evidence of mitral stenosis.  4. The aortic valve is tricuspid. There is mild calcification of the aortic valve. Aortic valve regurgitation is mild. Aortic valve sclerosis is present, with no evidence of aortic valve stenosis.  5. The inferior vena cava is normal in size with greater than 50% respiratory variability, suggesting right atrial pressure of 3 mmHg. FINDINGS  Left Ventricle: Left ventricular ejection fraction, by estimation, is 40 to 45%. The left ventricle has mildly decreased function. The left ventricle demonstrates global hypokinesis. Strain was performed and the global longitudinal strain is indeterminate. The left ventricular internal cavity size was mildly dilated. There is no left ventricular hypertrophy. Left ventricular diastolic parameters are consistent with Grade I diastolic dysfunction (impaired relaxation). Right Ventricle: The right ventricular size is normal. No increase in right ventricular wall thickness. Right ventricular systolic function is normal. Left Atrium: Left atrial size was normal in size. Right Atrium: Right atrial size was normal in size. Pericardium: There is no evidence of pericardial effusion. Mitral Valve: The mitral valve is abnormal. There is mild thickening of the mitral valve leaflet(s). Trivial mitral valve regurgitation. No evidence of mitral valve stenosis. Tricuspid Valve: The tricuspid valve is normal in structure. Tricuspid valve regurgitation is mild . No evidence of tricuspid stenosis. Aortic Valve: The aortic valve is tricuspid. There is mild calcification of the aortic valve.  Aortic valve regurgitation is mild. Aortic valve sclerosis is present, with no evidence of aortic valve stenosis. Aortic valve mean gradient measures 2.0 mmHg. Aortic valve peak gradient measures 5.2 mmHg. Aortic valve area, by VTI measures 2.22 cm. Pulmonic Valve: The pulmonic valve was normal in structure. Pulmonic valve regurgitation is trivial. No evidence of pulmonic stenosis. Aorta: The aortic root is normal in size and structure. Venous: The inferior vena cava is normal in size with greater than 50% respiratory variability, suggesting right atrial pressure of 3 mmHg. IAS/Shunts: No atrial level shunt detected by color flow Doppler. Additional Comments: 3D was performed not requiring image post processing on an independent workstation and was indeterminate.  LEFT VENTRICLE PLAX 2D LVIDd:         4.30 cm     Diastology LVIDs:         3.60 cm     LV e' medial:    6.74 cm/s LV PW:         0.80 cm     LV E/e' medial:  8.2 LV IVS:        0.80 cm     LV e' lateral:   7.51 cm/s LVOT diam:     1.80 cm     LV E/e' lateral: 7.4 LV SV:         39 LV SV Index:   26 LVOT Area:     2.54 cm  LV Volumes (MOD) LV vol d, MOD A2C: 58.4 ml LV vol d, MOD A4C: 64.3 ml LV vol s, MOD A2C: 33.7 ml LV vol s, MOD A4C: 38.4 ml LV SV MOD A2C:     24.7  ml LV SV MOD A4C:     64.3 ml LV SV MOD BP:      25.7 ml RIGHT VENTRICLE RV S prime:     9.57 cm/s TAPSE (M-mode): 1.3 cm LEFT ATRIUM             Index LA diam:        2.40 cm 1.63 cm/m LA Vol (A2C):   41.3 ml 28.12 ml/m LA Vol (A4C):   22.8 ml 15.52 ml/m LA Biplane Vol: 32.1 ml 21.85 ml/m  AORTIC VALVE AV Area (Vmax):    1.98 cm AV Area (Vmean):   1.96 cm AV Area (VTI):     2.22 cm AV Vmax:           114.00 cm/s AV Vmean:          68.000 cm/s AV VTI:            0.174 m AV Peak Grad:      5.2 mmHg AV Mean Grad:      2.0 mmHg LVOT Vmax:         88.60 cm/s LVOT Vmean:        52.500 cm/s LVOT VTI:          0.152 m LVOT/AV VTI ratio: 0.87  AORTA Ao Asc diam: 2.70 cm MITRAL VALVE                TRICUSPID VALVE MV Area (PHT): 6.17 cm    TR Peak grad:   8.9 mmHg MV Decel Time: 123 msec    TR Vmax:        149.00 cm/s MV E velocity: 55.30 cm/s MV A velocity: 74.60 cm/s  SHUNTS MV E/A ratio:  0.74        Systemic VTI:  0.15 m                            Systemic Diam: 1.80 cm Janelle Mediate MD Electronically signed by Janelle Mediate MD Signature Date/Time: 06/10/2023/12:52:54 PM    Final    CT Soft Tissue Neck W Contrast Result Date: 06/08/2023 CLINICAL DATA:  Non-small-cell lung cancer, neck mass, restaging * Tracking Code: BO * EXAM: CT NECK WITH CONTRAST CT CHEST, ABDOMEN, AND PELVIS WITH CONTRAST TECHNIQUE: Multidetector CT imaging of the neck, chest, abdomen and pelvis was performed following the standard protocol during bolus administration of intravenous contrast. RADIATION DOSE REDUCTION: This exam was performed according to the departmental dose-optimization program which includes automated exposure control, adjustment of the mA and/or kV according to patient size and/or use of iterative reconstruction technique. CONTRAST:  85mL OMNIPAQUE  IOHEXOL  300 MG/ML  SOLN COMPARISON:  04/07/2023 FINDINGS: CT NECK FINDINGS Skull base and limited brain: No acute fracture. No primary bone lesion or focal pathologic process. Soft tissues and lymph nodes: Continued reduction in size of a left supraclavicular lymph node, now measuring 0.4 cm, previously 0.7 cm (series 3, image 65). No residual or recurrent lymphadenopathy in the neck. Paraspinous soft tissues and spinal canal: No prevertebral fluid or swelling. No visible canal hematoma. Cervical spine: Degenerative straightening of the normal cervical lordosis. Moderate multilevel cervical disc degenerative disease, worst from C4-C6. Other: None. CT CHEST FINDINGS Cardiovascular: Right chest port catheter. Normal heart size. No pericardial effusion. Mediastinum/Nodes: Unchanged treated soft tissue in the mediastinum and right hilum (series 3, image 72). No residual  discretely enlarged lymph nodes. Unchanged circumferential wall thickening of the midesophagus (series 3, image 66). Thyroid   gland and trachea without significant findings. Lungs/Pleura: Severe emphysema. Diffuse bilateral bronchial wall thickening. Unchanged post treatment appearance of the perihilar and suprahilar right lung with bandlike radiation fibrosis and bronchiectasis (series 4, image 57). No pleural effusion or pneumothorax. Musculoskeletal: No chest wall abnormality. No acute osseous findings. CT ABDOMEN PELVIS FINDINGS Hepatobiliary: No focal liver abnormality is seen. Status post cholecystectomy. Unchanged postoperative biliary dilatation. Pancreas: Unchanged prominence of the central pancreatic duct (series 3, image 149). No pancreatic ductal dilatation or surrounding inflammatory changes. Spleen: Normal in size without significant abnormality. Adrenals/Urinary Tract: Adrenal glands are unremarkable. Kidneys are normal, without renal calculi, solid lesion, or hydronephrosis. Bladder is unremarkable. Stomach/Bowel: Stomach is within normal limits. Status post appendectomy. No evidence of bowel wall thickening, distention, or inflammatory changes. Vascular/Lymphatic: Aortic atherosclerosis. Interval resolution of previously enlarged left retroperitoneal lymph node, now measuring 0.9 x 0.5 cm, previously 1.6 x 1.2 cm (series 3, image 154). Reproductive: Status post hysterectomy. Other: No abdominal wall hernia or abnormality. No ascites. Musculoskeletal: No acute osseous findings. IMPRESSION: 1. Continued reduction in size of a left supraclavicular lymph node. No residual or recurrent lymphadenopathy in the neck. 2. Unchanged treated soft tissue in the mediastinum and right hilum. No residual discretely enlarged lymph nodes. 3. Unchanged post treatment appearance of the perihilar and suprahilar right lung with bandlike radiation fibrosis and bronchiectasis. 4. Interval resolution of previously enlarged  left retroperitoneal lymph node; unclear whether this reflects treatment response or resolution of reactive lymphadenopathy. 5. No evidence of new lymphadenopathy or metastatic disease in the chest, abdomen, or pelvis. 6. Severe emphysema. Aortic Atherosclerosis (ICD10-I70.0) and Emphysema (ICD10-J43.9). Electronically Signed   By: Fredricka Jenny M.D.   On: 06/08/2023 07:50   CT CHEST ABDOMEN PELVIS W CONTRAST Result Date: 06/08/2023 CLINICAL DATA:  Non-small-cell lung cancer, neck mass, restaging * Tracking Code: BO * EXAM: CT NECK WITH CONTRAST CT CHEST, ABDOMEN, AND PELVIS WITH CONTRAST TECHNIQUE: Multidetector CT imaging of the neck, chest, abdomen and pelvis was performed following the standard protocol during bolus administration of intravenous contrast. RADIATION DOSE REDUCTION: This exam was performed according to the departmental dose-optimization program which includes automated exposure control, adjustment of the mA and/or kV according to patient size and/or use of iterative reconstruction technique. CONTRAST:  85mL OMNIPAQUE  IOHEXOL  300 MG/ML  SOLN COMPARISON:  04/07/2023 FINDINGS: CT NECK FINDINGS Skull base and limited brain: No acute fracture. No primary bone lesion or focal pathologic process. Soft tissues and lymph nodes: Continued reduction in size of a left supraclavicular lymph node, now measuring 0.4 cm, previously 0.7 cm (series 3, image 65). No residual or recurrent lymphadenopathy in the neck. Paraspinous soft tissues and spinal canal: No prevertebral fluid or swelling. No visible canal hematoma. Cervical spine: Degenerative straightening of the normal cervical lordosis. Moderate multilevel cervical disc degenerative disease, worst from C4-C6. Other: None. CT CHEST FINDINGS Cardiovascular: Right chest port catheter. Normal heart size. No pericardial effusion. Mediastinum/Nodes: Unchanged treated soft tissue in the mediastinum and right hilum (series 3, image 72). No residual discretely  enlarged lymph nodes. Unchanged circumferential wall thickening of the midesophagus (series 3, image 66). Thyroid  gland and trachea without significant findings. Lungs/Pleura: Severe emphysema. Diffuse bilateral bronchial wall thickening. Unchanged post treatment appearance of the perihilar and suprahilar right lung with bandlike radiation fibrosis and bronchiectasis (series 4, image 57). No pleural effusion or pneumothorax. Musculoskeletal: No chest wall abnormality. No acute osseous findings. CT ABDOMEN PELVIS FINDINGS Hepatobiliary: No focal liver abnormality is seen. Status post cholecystectomy. Unchanged postoperative  biliary dilatation. Pancreas: Unchanged prominence of the central pancreatic duct (series 3, image 149). No pancreatic ductal dilatation or surrounding inflammatory changes. Spleen: Normal in size without significant abnormality. Adrenals/Urinary Tract: Adrenal glands are unremarkable. Kidneys are normal, without renal calculi, solid lesion, or hydronephrosis. Bladder is unremarkable. Stomach/Bowel: Stomach is within normal limits. Status post appendectomy. No evidence of bowel wall thickening, distention, or inflammatory changes. Vascular/Lymphatic: Aortic atherosclerosis. Interval resolution of previously enlarged left retroperitoneal lymph node, now measuring 0.9 x 0.5 cm, previously 1.6 x 1.2 cm (series 3, image 154). Reproductive: Status post hysterectomy. Other: No abdominal wall hernia or abnormality. No ascites. Musculoskeletal: No acute osseous findings. IMPRESSION: 1. Continued reduction in size of a left supraclavicular lymph node. No residual or recurrent lymphadenopathy in the neck. 2. Unchanged treated soft tissue in the mediastinum and right hilum. No residual discretely enlarged lymph nodes. 3. Unchanged post treatment appearance of the perihilar and suprahilar right lung with bandlike radiation fibrosis and bronchiectasis. 4. Interval resolution of previously enlarged left  retroperitoneal lymph node; unclear whether this reflects treatment response or resolution of reactive lymphadenopathy. 5. No evidence of new lymphadenopathy or metastatic disease in the chest, abdomen, or pelvis. 6. Severe emphysema. Aortic Atherosclerosis (ICD10-I70.0) and Emphysema (ICD10-J43.9). Electronically Signed   By: Fredricka Jenny M.D.   On: 06/08/2023 07:50     ASSESSMENT AND PLAN: This is a very pleasant 58 years old white female with stage IIIB (T1b, N3, M0) non-small cell lung cancer, adenocarcinoma diagnosed in November 2022 with no actionable mutation and negative PD-L1 expression. The patient completed a course of concurrent chemoradiation with weekly carboplatin  for AUC of 2 and paclitaxel  45 Mg/M2 status post 7 cycles.  She has been tolerating her treatment well except for the mild odynophagia and skin burns. She was also recently admitted to the hospital complaining of dysphagia and odynophagia secondary to radiation induced esophagitis.  She is feeling much better but continues to have residual dysphagia.  She is followed by gastroenterology and was seen by Dr. Howard Macho during her hospitalization. Her scan showed improvement of her disease. I recommended for the patient treatment with consolidation immunotherapy with Imfinzi  1500 Mg IV every 4 weeks.  Status post 3 cycles.  Last dose was given in April 2023.  Her treatment was discontinued secondary to suspicious immunotherapy mediated pneumonitis with significant shortness of breath at that time and she was treated with a tapered dose of prednisone . Unfortunately her scan showed evidence for disease recurrence with enlargement of lower right cervical lymph nodes as well as mediastinal lymphadenopathy.  She has palpable right cervical lymphadenopathy. 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 The patient underwent ultrasound-guided core biopsy of the right supraclavicular lymph nodes but the final pathology showed necrotic tumor cells with complete coagulative necrosis with focal fibrous tissue and histiocytic reaction.  She also had MRI of the brain that showed no evidence of metastatic disease to the brain. The patient was seen by Dr. Eloise Hake and started palliative radiotherapy to the enlarging right supraclavicular lymphadenopathy.  This was completed on October 28, 2021. The patient was found to have metastatic disease in February 2024 with several lymphadenopathy in the chest as well as supraclavicular, upper abdomen as well as suspicious small  liver metastasis. 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  I will send the tissue biopsy to [facility] for molecular studies. She is currently undergoing systemic chemotherapy with carboplatin  for AUC of 5, Alimta 500 Mg/M2 and Keytruda  200 Mg IV every 3 weeks status post 20 cycles.  Starting from cycle #3, I reduced her dose of carboplatin  to AUC of 4 and Alimta 400 Mg/M2.  She has been tolerating her treatment fairly well. She had repeat CT scan of the neck, chest, abdomen and pelvis performed recently.  I personally independently reviewed the scans and discussed the results with the patient today.  Her scan showed no concerning findings for disease progression and there was further decrease in the size of the left supraclavicular lymphadenopathy as well as resolution of the abdominal lymphadenopathy.    Metastatic non-small cell lung cancer Initially diagnosed as stage III B  in November 2022 with disease progression and metastasis in August 2023 and February 2024. Currently on maintenance treatment with Alimta and Ketruda every three weeks. Recent CT scan of the neck, chest, abdomen, and pelvis shows further decrease in the lymph node in the neck and resolution of the lymph node in the abdomen. White blood count has recovered. - Proceed with cycle number 21 of maintenance treatment today.  Left ventricular dysfunction Ejection fraction of 40-45% as per recent echocardiogram. Symptoms include dyspnea, paresthesia in the right arm and leg, and peripheral edema. No baseline echocardiogram available for comparison. Cardiologist consultation is recommended for further evaluation and management. - Refer to cardiologist, Dr. Katheryne Pane, for further evaluation and management.   The patient was advised to call immediately if she has any concerning symptoms in the interval. The patient voices understanding of current disease status and treatment options and is in agreement with the current care plan.  All questions were answered. The patient knows to call the clinic with any problems, questions or concerns. We can certainly see the patient much sooner if necessary.  The total time spent in the appointment was 30 minutes.  Disclaimer: This note was dictated with voice recognition software. Similar sounding words can inadvertently be transcribed and may not be corrected upon review.

## 2023-06-16 ENCOUNTER — Other Ambulatory Visit (HOSPITAL_BASED_OUTPATIENT_CLINIC_OR_DEPARTMENT_OTHER)

## 2023-06-16 LAB — TSH: TSH: 5.83 u[IU]/mL — ABNORMAL HIGH (ref 0.350–4.500)

## 2023-06-16 LAB — T4: T4, Total: 10.2 ug/dL (ref 4.5–12.0)

## 2023-06-17 ENCOUNTER — Other Ambulatory Visit: Payer: Self-pay

## 2023-06-23 ENCOUNTER — Emergency Department (HOSPITAL_BASED_OUTPATIENT_CLINIC_OR_DEPARTMENT_OTHER)

## 2023-06-23 ENCOUNTER — Observation Stay (HOSPITAL_BASED_OUTPATIENT_CLINIC_OR_DEPARTMENT_OTHER)
Admission: EM | Admit: 2023-06-23 | Discharge: 2023-06-25 | Disposition: A | Discharge status: Home / Self Care | Attending: Student | Admitting: Student

## 2023-06-23 ENCOUNTER — Other Ambulatory Visit (HOSPITAL_BASED_OUTPATIENT_CLINIC_OR_DEPARTMENT_OTHER): Payer: Self-pay

## 2023-06-23 ENCOUNTER — Encounter: Payer: Self-pay | Admitting: Physician Assistant

## 2023-06-23 ENCOUNTER — Other Ambulatory Visit: Payer: Self-pay

## 2023-06-23 ENCOUNTER — Encounter: Payer: Self-pay | Admitting: Internal Medicine

## 2023-06-23 ENCOUNTER — Encounter (HOSPITAL_BASED_OUTPATIENT_CLINIC_OR_DEPARTMENT_OTHER): Payer: Self-pay

## 2023-06-23 DIAGNOSIS — D61818 Other pancytopenia: Secondary | ICD-10-CM

## 2023-06-23 DIAGNOSIS — K219 Gastro-esophageal reflux disease without esophagitis: Secondary | ICD-10-CM | POA: Insufficient documentation

## 2023-06-23 DIAGNOSIS — K5900 Constipation, unspecified: Secondary | ICD-10-CM

## 2023-06-23 DIAGNOSIS — K5289 Other specified noninfective gastroenteritis and colitis: Principal | ICD-10-CM | POA: Diagnosis present

## 2023-06-23 DIAGNOSIS — Z7982 Long term (current) use of aspirin: Secondary | ICD-10-CM | POA: Diagnosis not present

## 2023-06-23 DIAGNOSIS — C3490 Malignant neoplasm of unspecified part of unspecified bronchus or lung: Secondary | ICD-10-CM

## 2023-06-23 DIAGNOSIS — C3491 Malignant neoplasm of unspecified part of right bronchus or lung: Secondary | ICD-10-CM | POA: Diagnosis present

## 2023-06-23 DIAGNOSIS — E785 Hyperlipidemia, unspecified: Secondary | ICD-10-CM | POA: Diagnosis not present

## 2023-06-23 DIAGNOSIS — I700 Atherosclerosis of aorta: Secondary | ICD-10-CM | POA: Insufficient documentation

## 2023-06-23 DIAGNOSIS — I5042 Chronic combined systolic (congestive) and diastolic (congestive) heart failure: Secondary | ICD-10-CM | POA: Insufficient documentation

## 2023-06-23 DIAGNOSIS — J449 Chronic obstructive pulmonary disease, unspecified: Secondary | ICD-10-CM | POA: Insufficient documentation

## 2023-06-23 DIAGNOSIS — K5903 Drug induced constipation: Secondary | ICD-10-CM

## 2023-06-23 DIAGNOSIS — I2510 Atherosclerotic heart disease of native coronary artery without angina pectoris: Secondary | ICD-10-CM | POA: Insufficient documentation

## 2023-06-23 DIAGNOSIS — C7800 Secondary malignant neoplasm of unspecified lung: Secondary | ICD-10-CM | POA: Insufficient documentation

## 2023-06-23 DIAGNOSIS — I110 Hypertensive heart disease with heart failure: Secondary | ICD-10-CM | POA: Diagnosis not present

## 2023-06-23 DIAGNOSIS — R739 Hyperglycemia, unspecified: Secondary | ICD-10-CM | POA: Diagnosis not present

## 2023-06-23 HISTORY — DX: Malignant neoplasm of unspecified part of unspecified bronchus or lung: C34.90

## 2023-06-23 HISTORY — DX: Constipation, unspecified: K59.00

## 2023-06-23 HISTORY — DX: Other pancytopenia: D61.818

## 2023-06-23 HISTORY — DX: Other specified noninfective gastroenteritis and colitis: K52.89

## 2023-06-23 LAB — CBC WITH DIFFERENTIAL/PLATELET
Abs Immature Granulocytes: 0 10*3/uL (ref 0.00–0.07)
Basophils Absolute: 0 10*3/uL (ref 0.0–0.1)
Basophils Relative: 0 %
Eosinophils Absolute: 0 10*3/uL (ref 0.0–0.5)
Eosinophils Relative: 1 %
HCT: 24 % — ABNORMAL LOW (ref 36.0–46.0)
Hemoglobin: 8.1 g/dL — ABNORMAL LOW (ref 12.0–15.0)
Immature Granulocytes: 0 %
Lymphocytes Relative: 21 %
Lymphs Abs: 0.2 10*3/uL — ABNORMAL LOW (ref 0.7–4.0)
MCH: 34.8 pg — ABNORMAL HIGH (ref 26.0–34.0)
MCHC: 33.8 g/dL (ref 30.0–36.0)
MCV: 103 fL — ABNORMAL HIGH (ref 80.0–100.0)
Monocytes Absolute: 0.3 10*3/uL (ref 0.1–1.0)
Monocytes Relative: 32 %
Neutro Abs: 0.4 10*3/uL — CL (ref 1.7–7.7)
Neutrophils Relative %: 46 %
Platelets: 112 10*3/uL — ABNORMAL LOW (ref 150–400)
RBC: 2.33 MIL/uL — ABNORMAL LOW (ref 3.87–5.11)
RDW: 16.6 % — ABNORMAL HIGH (ref 11.5–15.5)
WBC: 1 10*3/uL — CL (ref 4.0–10.5)
nRBC: 0 % (ref 0.0–0.2)

## 2023-06-23 LAB — CBC
HCT: 21.7 % — ABNORMAL LOW (ref 36.0–46.0)
Hemoglobin: 7.3 g/dL — ABNORMAL LOW (ref 12.0–15.0)
MCH: 35.1 pg — ABNORMAL HIGH (ref 26.0–34.0)
MCHC: 33.6 g/dL (ref 30.0–36.0)
MCV: 104.3 fL — ABNORMAL HIGH (ref 80.0–100.0)
Platelets: 89 10*3/uL — ABNORMAL LOW (ref 150–400)
RBC: 2.08 MIL/uL — ABNORMAL LOW (ref 3.87–5.11)
RDW: 16.7 % — ABNORMAL HIGH (ref 11.5–15.5)
WBC: 1.6 10*3/uL — ABNORMAL LOW (ref 4.0–10.5)
nRBC: 0 % (ref 0.0–0.2)

## 2023-06-23 LAB — COMPREHENSIVE METABOLIC PANEL WITH GFR
ALT: 37 U/L (ref 0–44)
ALT: 94 U/L — ABNORMAL HIGH (ref 0–44)
AST: 38 U/L (ref 15–41)
AST: 112 U/L — ABNORMAL HIGH (ref 15–41)
Albumin: 3.6 g/dL (ref 3.5–5.0)
Albumin: 2.8 g/dL — ABNORMAL LOW (ref 3.5–5.0)
Alkaline Phosphatase: 168 U/L — ABNORMAL HIGH (ref 38–126)
Alkaline Phosphatase: 243 U/L — ABNORMAL HIGH (ref 38–126)
Anion gap: 11 (ref 5–15)
Anion gap: 7 (ref 5–15)
BUN: 11 mg/dL (ref 6–20)
BUN: 10 mg/dL (ref 6–20)
CO2: 27 mmol/L (ref 22–32)
CO2: 27 mmol/L (ref 22–32)
Calcium: 9.5 mg/dL (ref 8.9–10.3)
Calcium: 8.4 mg/dL — ABNORMAL LOW (ref 8.9–10.3)
Chloride: 101 mmol/L (ref 98–111)
Chloride: 101 mmol/L (ref 98–111)
Creatinine, Ser: 1 mg/dL (ref 0.44–1.00)
Creatinine, Ser: 0.85 mg/dL (ref 0.44–1.00)
GFR, Estimated: >60 mL/min (ref >60)
GFR, Estimated: >60 mL/min (ref >60)
Glucose, Bld: 149 mg/dL — ABNORMAL HIGH (ref 70–99)
Glucose, Bld: 122 mg/dL — ABNORMAL HIGH (ref 70–99)
Potassium: 3.8 mmol/L (ref 3.5–5.1)
Potassium: 3.7 mmol/L (ref 3.5–5.1)
Sodium: 139 mmol/L (ref 135–145)
Sodium: 135 mmol/L (ref 135–145)
Total Bilirubin: 0.5 mg/dL (ref 0.0–1.2)
Total Bilirubin: 0.8 mg/dL (ref 0.0–1.2)
Total Protein: 6.9 g/dL (ref 6.5–8.1)
Total Protein: 6.3 g/dL — ABNORMAL LOW (ref 6.5–8.1)

## 2023-06-23 LAB — URINALYSIS, ROUTINE W REFLEX MICROSCOPIC
Bilirubin Urine: NEGATIVE
Glucose, UA: NEGATIVE mg/dL
Hgb urine dipstick: NEGATIVE
Ketones, ur: NEGATIVE mg/dL
Leukocytes,Ua: NEGATIVE
Nitrite: NEGATIVE
Protein, ur: NEGATIVE mg/dL
Specific Gravity, Urine: 1.015 (ref 1.005–1.030)
pH: 7 (ref 5.0–8.0)

## 2023-06-23 LAB — HEMOGLOBIN A1C
Hgb A1c MFr Bld: 5 % (ref 4.8–5.6)
Mean Plasma Glucose: 96.8 mg/dL

## 2023-06-23 LAB — LACTIC ACID, PLASMA: Lactic Acid, Venous: 1 mmol/L (ref 0.5–1.9)

## 2023-06-23 LAB — LIPASE, BLOOD: Lipase: 33 U/L (ref 11–51)

## 2023-06-23 MED ORDER — ASPIRIN 81 MG PO TBEC
81.0000 mg | DELAYED_RELEASE_TABLET | Freq: Every day | ORAL | Status: DC
  Administered 2023-06-23 – 2023-06-24 (×2): 81 mg via ORAL
  Filled 2023-06-23 (×2): qty 1

## 2023-06-23 MED ORDER — ONDANSETRON HCL 4 MG/2ML IJ SOLN
4.0000 mg | Freq: Four times a day (QID) | INTRAMUSCULAR | Status: DC | PRN
  Administered 2023-06-23: 4 mg via INTRAVENOUS
  Filled 2023-06-23: qty 2

## 2023-06-23 MED ORDER — OXYCODONE HCL 5 MG PO TABS
10.0000 mg | ORAL_TABLET | Freq: Three times a day (TID) | ORAL | Status: DC | PRN
  Administered 2023-06-24 – 2023-06-25 (×3): 10 mg via ORAL
  Filled 2023-06-23 (×3): qty 2

## 2023-06-23 MED ORDER — ARFORMOTEROL TARTRATE 15 MCG/2ML IN NEBU
15.0000 ug | INHALATION_SOLUTION | Freq: Two times a day (BID) | RESPIRATORY_TRACT | Status: DC
  Filled 2023-06-23 (×2): qty 2

## 2023-06-23 MED ORDER — UMECLIDINIUM BROMIDE 62.5 MCG/ACT IN AEPB
1.0000 | INHALATION_SPRAY | Freq: Every day | RESPIRATORY_TRACT | Status: DC
  Administered 2023-06-23: 1 via RESPIRATORY_TRACT
  Filled 2023-06-23: qty 7

## 2023-06-23 MED ORDER — BUPROPION HCL ER (XL) 150 MG PO TB24
150.0000 mg | ORAL_TABLET | Freq: Every day | ORAL | Status: DC
  Administered 2023-06-23 – 2023-06-24 (×2): 150 mg via ORAL
  Filled 2023-06-23 (×2): qty 1

## 2023-06-23 MED ORDER — POLYETHYLENE GLYCOL 3350 17 G PO PACK: 17.0000 g | PACK | Freq: Every day | ORAL | Status: DC

## 2023-06-23 MED ORDER — ENOXAPARIN SODIUM 40 MG/0.4ML IJ SOSY
40.0000 mg | PREFILLED_SYRINGE | INTRAMUSCULAR | Status: DC
  Administered 2023-06-23 – 2023-06-24 (×2): 40 mg via SUBCUTANEOUS
  Filled 2023-06-23 (×2): qty 0.4

## 2023-06-23 MED ORDER — HYDROMORPHONE HCL 1 MG/ML IJ SOLN
0.5000 mg | Freq: Once | INTRAMUSCULAR | Status: AC
  Administered 2023-06-23: 0.5 mg via INTRAVENOUS
  Filled 2023-06-23: qty 1

## 2023-06-23 MED ORDER — SIMETHICONE 80 MG PO CHEW
80.0000 mg | CHEWABLE_TABLET | Freq: Once | ORAL | Status: AC
  Administered 2023-06-23: 80 mg via ORAL
  Filled 2023-06-23: qty 1

## 2023-06-23 MED ORDER — ACETAMINOPHEN 325 MG PO TABS
650.0000 mg | ORAL_TABLET | Freq: Four times a day (QID) | ORAL | Status: DC | PRN
  Administered 2023-06-23 – 2023-06-25 (×3): 650 mg via ORAL
  Filled 2023-06-23 (×3): qty 2

## 2023-06-23 MED ORDER — ALBUTEROL SULFATE (2.5 MG/3ML) 0.083% IN NEBU: 2.5000 mg | INHALATION_SOLUTION | Freq: Four times a day (QID) | RESPIRATORY_TRACT | Status: DC | PRN

## 2023-06-23 MED ORDER — ATORVASTATIN CALCIUM 20 MG PO TABS
40.0000 mg | ORAL_TABLET | Freq: Every day | ORAL | Status: DC
Start: 2023-06-23 — End: 2023-06-25
  Administered 2023-06-23 – 2023-06-24 (×2): 40 mg via ORAL
  Filled 2023-06-23 (×2): qty 2

## 2023-06-23 MED ORDER — SERTRALINE HCL 50 MG PO TABS
50.0000 mg | ORAL_TABLET | Freq: Every day | ORAL | Status: DC
  Administered 2023-06-23 – 2023-06-24 (×2): 50 mg via ORAL
  Filled 2023-06-23 (×2): qty 1

## 2023-06-23 MED ORDER — LACTATED RINGERS IV BOLUS
500.0000 mL | Freq: Once | INTRAVENOUS | Status: AC
  Administered 2023-06-23: 500 mL via INTRAVENOUS

## 2023-06-23 MED ORDER — LACTATED RINGERS IV BOLUS
1000.0000 mL | Freq: Once | INTRAVENOUS | Status: AC
  Administered 2023-06-23: 1000 mL via INTRAVENOUS

## 2023-06-23 MED ORDER — ONDANSETRON HCL 4 MG PO TABS: 4.0000 mg | ORAL_TABLET | Freq: Four times a day (QID) | ORAL | Status: DC | PRN

## 2023-06-23 MED ORDER — ACETAMINOPHEN 650 MG RE SUPP: 650.0000 mg | Freq: Four times a day (QID) | RECTAL | Status: DC | PRN

## 2023-06-23 MED ORDER — KETOROLAC TROMETHAMINE 15 MG/ML IJ SOLN
15.0000 mg | Freq: Four times a day (QID) | INTRAMUSCULAR | Status: DC | PRN
  Administered 2023-06-23: 15 mg via INTRAVENOUS
  Filled 2023-06-23: qty 1

## 2023-06-23 MED ORDER — KETOROLAC TROMETHAMINE 30 MG/ML IJ SOLN: 30.0000 mg | Freq: Four times a day (QID) | INTRAMUSCULAR | Status: DC | PRN

## 2023-06-23 MED ORDER — SENNOSIDES-DOCUSATE SODIUM 8.6-50 MG PO TABS
1.0000 | ORAL_TABLET | Freq: Every day | ORAL | Status: DC
  Administered 2023-06-23 – 2023-06-24 (×2): 1 via ORAL
  Filled 2023-06-23 (×3): qty 1

## 2023-06-23 MED ORDER — MORPHINE SULFATE (PF) 4 MG/ML IV SOLN
4.0000 mg | Freq: Once | INTRAVENOUS | Status: AC
  Administered 2023-06-23: 4 mg via INTRAVENOUS
  Filled 2023-06-23: qty 1

## 2023-06-23 MED ORDER — IOHEXOL 300 MG/ML  SOLN
100.0000 mL | Freq: Once | INTRAMUSCULAR | Status: AC | PRN
  Administered 2023-06-23: 100 mL via INTRAVENOUS

## 2023-06-23 MED ORDER — FAMOTIDINE 20 MG PO TABS
20.0000 mg | ORAL_TABLET | Freq: Two times a day (BID) | ORAL | Status: DC
  Administered 2023-06-23 – 2023-06-25 (×4): 20 mg via ORAL
  Filled 2023-06-23 (×4): qty 1

## 2023-06-23 NOTE — ED Provider Notes (Signed)
 [place] EMERGENCY DEPARTMENT AT [facility] Provider Note   CSN: [id number] Arrival date & time: 06/23/23  0941     History  Chief Complaint  Patient presents with   Constipation   Emesis    Julie Nicholson is a 58 y.o. female.   Constipation Associated symptoms: abdominal pain and vomiting   Emesis Associated symptoms: abdominal pain   Patient is a 58 year old female presents ED today with complaints of lower abdominal pain x 2 days accompanied with nausea and 2 episodes of vomiting.  Previous medical history of metastatic stage IV lung cancer undergoing infusions with last infusion being on 06/15/2023, severe emphysema, pancreatitis, chronic cholecystitis, COPD, neutropenia, radiation pneumonitis. Notes that she has been more constipated recently for the last 7 days but has had a bowel movement 3 days ago and reports a "quarter sized bowel movement" last night. Taking magnesium citrate which she vomited up last night.  On chronic narcotics.  Denies fever, visual changes, chest pain, worsening shortness of breath, diarrhea, melena, hematochezia, dysuria, hematuria.      Home Medications Prior to Admission medications   Medication Sig Start Date End Date Taking? Authorizing Provider  acetaminophen  (TYLENOL ) 500 MG tablet Take 1,000 mg by mouth every 6 (six) hours as needed for moderate pain.   Yes [provider]  albuterol  (PROVENTIL ) (2.5 MG/3ML) 0.083% nebulizer solution albuterol  sulfate 2.5 mg/3 mL (0.083 %) solution for nebulization  USE 1 VIAL IN NEBULIZER EVERY 6 HOURS AS NEEDED FOR WHEEZING OR SHORTNESS OF BREATH   Yes [provider]  albuterol  (VENTOLIN  HFA) 108 (90 Base) MCG/ACT inhaler Inhale 1-2 puffs into the lungs every 6 (six) hours as needed for wheezing or shortness of breath. 09/23/22  Yes Denson Flake, MD  aspirin EC 81 MG tablet Take 81 mg by mouth at bedtime.   Yes [provider]  atorvastatin  (LIPITOR) 40 MG  tablet Take 40 mg by mouth at bedtime.   Yes [provider]  azelastine  (ASTELIN ) 0.1 % nasal spray Place 1 spray into both nostrils 2 (two) times daily as needed. Use in each nostril as directed 11/12/22  Yes Patel, Ammie Bale, MD  benzonatate  (TESSALON ) 200 MG capsule Take 1 capsule (200 mg total) by mouth 3 (three) times daily as needed. 07/07/22  Yes Marlene Simas, MD  Biotin 5000 MCG TABS Take 5,000 mcg by mouth at bedtime.   Yes [provider]  buPROPion  (WELLBUTRIN  XL) 150 MG 24 hr tablet Take 150 mg by mouth at bedtime. 04/12/22  Yes [provider]  Ca Carbonate-Mag Hydroxide (ROLAIDS PO) Take 1 tablet by mouth daily as needed (heartburn).   Yes [provider]  cetirizine  (ZYRTEC  ALLERGY ) 10 MG tablet Take 1 tablet (10 mg total) by mouth daily as needed for rhinitis. 11/12/22  Yes Kandice Orleans, MD  famotidine  (PEPCID ) 20 MG tablet Take 1 tablet by mouth twice daily 05/03/23  Yes [name], Delora Ferry, MD  fluticasone  (FLONASE ) 50 MCG/ACT nasal spray Place 2 sprays into both nostrils daily. 04/21/23  Yes Denson Flake, MD  folic acid  (FOLVITE ) 1 MG tablet Take 1 tablet by mouth once daily 06/15/23  Yes Marlene Simas, MD  furosemide (LASIX) 20 MG tablet Take 20 mg by mouth daily.   Yes [provider]  lidocaine -prilocaine  (EMLA ) cream APPLY TO PORT-A-CATH 30 TO 60 MINUTES BEFORE TREATMENT 09/27/22  Yes Marlene Simas, MD  linaCLOtide (LINZESS PO) Take by mouth.   Yes [provider]  loratadine  (  CLARITIN ) 10 MG tablet Take 10 mg by mouth daily. 05/15/23  Yes [provider]  Multiple Vitamins-Minerals (HAIR SKIN AND NAILS FORMULA PO) Take 1 tablet by mouth daily.   Yes [provider]  ondansetron  (ZOFRAN -ODT) 8 MG disintegrating tablet DISSOLVE 1 TABLET IN MOUTH THREE TIMES DAILY AS NEEDED 05/27/23  Yes Marlene Simas, MD  Oxycodone  HCl 10 MG TABS Take 1 tablet by mouth 3 (three) times daily as needed (pain).   Yes  [provider]  prochlorperazine  (COMPAZINE ) 10 MG tablet Take 1 tablet (10 mg total) by mouth every 6 (six) hours as needed. 12/29/22  Yes Heilingoetter, Cassandra L, PA-C  rizatriptan (MAXALT-MLT) 10 MG disintegrating tablet Take 10 mg by mouth as needed for migraine. May repeat in 2 hours if needed   Yes [provider]  sertraline (ZOLOFT) 50 MG tablet Take 1 tablet by mouth daily.   Yes [provider]  Tiotropium Bromide-Olodaterol (STIOLTO RESPIMAT ) 2.5-2.5 MCG/ACT AERS Inhale 2 puffs into the lungs daily. 09/23/22  Yes Denson Flake, MD  HYDROcodone  bit-homatropine (HYCODAN) 5-1.5 MG/5ML syrup Take 5 mLs by mouth every 6 (six) hours as needed for cough. Patient not taking: Reported on 06/23/2023 03/23/23   Heilingoetter, Cassandra L, PA-C  hydrocortisone  1 % ointment Apply 1 Application topically 2 (two) times daily. Patient not taking: Reported on 04/21/2023 04/19/22   Walisiewicz, Kaitlyn E, PA-C      Allergies    Carboplatin     Review of Systems   Review of Systems  Gastrointestinal:  Positive for abdominal pain, constipation and vomiting.  All other systems reviewed and are negative.   Physical Exam Updated Vital Signs BP 137/63   Pulse (!) 106   Temp 97.9 F (36.6 C)   Resp 15   Ht 5\' 2"  (1.575 m)   Wt 47.6 kg   SpO2 99%   BMI 19.20 kg/m  Physical Exam Vitals and nursing note reviewed.  Constitutional:      General: She is not in acute distress.    Appearance: Normal appearance. She is not ill-appearing or diaphoretic.  HENT:     Head: Normocephalic and atraumatic.     Mouth/Throat:     Mouth: Mucous membranes are moist.     Pharynx: Oropharynx is clear. No oropharyngeal exudate or posterior oropharyngeal erythema.  Eyes:     General: No scleral icterus.       Right eye: No discharge.        Left eye: No discharge.     Extraocular Movements: Extraocular movements intact.     Conjunctiva/sclera: Conjunctivae normal.  Cardiovascular:      Rate and Rhythm: Normal rate and regular rhythm.     Pulses: Normal pulses.     Heart sounds: Normal heart sounds. No murmur heard.    No friction rub. No gallop.  Pulmonary:     Effort: Pulmonary effort is normal. No respiratory distress.     Breath sounds: No wheezing or rales.  Abdominal:     General: Abdomen is flat. There is no distension.     Palpations: Abdomen is soft.     Tenderness: There is abdominal tenderness (Lower abdominal tenderness noted to both the lower left and lower right quadrants to palpation). There is no right CVA tenderness, left CVA tenderness, guarding or rebound.  Musculoskeletal:        General: No tenderness, deformity or signs of injury.     Cervical back: Normal range of motion and neck supple. No  rigidity.     Right lower leg: Edema (Mild ankle swelling noted to the right ankle) present.     Left lower leg: No edema.  Skin:    General: Skin is warm and dry.     Findings: No bruising, erythema or lesion.  Neurological:     General: No focal deficit present.     Mental Status: She is alert and oriented to person, place, and time. Mental status is at baseline.     Sensory: No sensory deficit.     Motor: No weakness.  Psychiatric:        Mood and Affect: Mood normal.     ED Results / Procedures / Treatments   Labs (all labs ordered are listed, but only abnormal results are displayed) Labs Reviewed  CBC WITH DIFFERENTIAL/PLATELET - Abnormal; Notable for the following components:      Result Value   WBC 1.0 (*)    RBC 2.33 (*)    Hemoglobin 8.1 (*)    HCT 24.0 (*)    MCV 103.0 (*)    MCH 34.8 (*)    RDW 16.6 (*)    Platelets 112 (*)    Neutro Abs 0.4 (*)    Lymphs Abs 0.2 (*)    All other components within normal limits  COMPREHENSIVE METABOLIC PANEL WITH GFR - Abnormal; Notable for the following components:   Glucose, Bld 149 (*)    Alkaline Phosphatase 168 (*)    All other components within normal limits  URINALYSIS, ROUTINE W  REFLEX MICROSCOPIC - Abnormal; Notable for the following components:   Bacteria, UA RARE (*)    All other components within normal limits  LIPASE, BLOOD    EKG EKG Interpretation Date/Time:  Thursday Jun 23 2023 11:17:04 EDT Ventricular Rate:  99 PR Interval:  145 QRS Duration:  106 QT Interval:  357 QTC Calculation: 459 R Axis:   87  Text Interpretation: Sinus rhythm No acute changes No significant change since last tracing Confirmed by Deatra Face [phone]) on 06/23/2023 12:19:59 PM  Radiology CT ABDOMEN PELVIS W CONTRAST Result Date: 06/23/2023 CLINICAL DATA:  Left lower quadrant abdominal pain. Non-small cell lung cancer. Metastatic disease evaluation. EXAM: CT ABDOMEN AND PELVIS WITH CONTRAST TECHNIQUE: Multidetector CT imaging of the abdomen and pelvis was performed using the standard protocol following bolus administration of intravenous contrast. RADIATION DOSE REDUCTION: This exam was performed according to the departmental dose-optimization program which includes automated exposure control, adjustment of the mA and/or kV according to patient size and/or use of iterative reconstruction technique. CONTRAST:  OMNIPAQUE  IOHEXOL  300 MG/ML  SOLN COMPARISON:  CT of the chest abdomen pelvis dated 06/01/2023. FINDINGS: Lower chest: The visualized lung bases are clear. No intra-abdominal free air or free fluid. Hepatobiliary: The liver is unremarkable. There is mild biliary dilatation, post cholecystectomy. No retained calcified stone noted in the central CBD. Pancreas: Unremarkable. No pancreatic ductal dilatation or surrounding inflammatory changes. Spleen: Normal in size without focal abnormality. Adrenals/Urinary Tract: The adrenal glands are unremarkable. There is no hydronephrosis on either side. There is symmetric enhancement and excretion of contrast by both kidneys. The visualized ureters and urinary bladder appear unremarkable Stomach/Bowel: There is moderate stool in the distal  colon. Mild pericolonic haziness along the descending and sigmoid colon may represent mild stercoral colitis. Clinical correlation is recommended. No bowel obstruction. Appendectomy. Vascular/Lymphatic: Mild aortoiliac atherosclerotic disease. The IVC is unremarkable. No portal venous gas. There is no adenopathy. Reproductive: Hysterectomy.  No suspicious adnexal masses.  Other: None Musculoskeletal: No acute or significant osseous findings. IMPRESSION: 1. Moderate stool in the distal colon with findings of possible mild stercoral colitis. Clinical correlation is recommended. No bowel obstruction. 2. No evidence of metastatic disease in the abdomen or pelvis. 3.  Aortic Atherosclerosis (ICD10-I70.0). Electronically Signed   By: Angus Bark M.D.   On: 06/23/2023 12:36    Procedures .Fecal disimpaction  Date/Time: 06/23/2023 3:13 PM  Performed by: Hayes Lipps, PA-C Authorized by: Hayes Lipps, PA-C  Consent: Verbal consent obtained. Consent given by: patient Patient understanding: patient states understanding of the procedure being performed Patient consent: the patient's understanding of the procedure matches consent given Procedure consent: procedure consent matches procedure scheduled Test results: test results available and properly labeled Imaging studies: imaging studies available Patient identity confirmed: verbally with patient and arm band Time out: Immediately prior to procedure a "time out" was called to verify the correct patient, procedure, equipment, support staff and site/side marked as required. Preparation: Patient was prepped and draped in the usual sterile fashion. Local anesthesia used: no  Anesthesia: Local anesthesia used: no  Sedation: Patient sedated: no  Patient tolerance: patient tolerated the procedure well with no immediate complications Comments: Minimal stool removed.  Unable to reach main stool       Medications Ordered in ED Medications   morphine  (PF) 4 MG/ML injection 4 mg (4 mg Intravenous Given 06/23/23 1134)  iohexol  (OMNIPAQUE ) 300 MG/ML solution 100 mL (100 mLs Intravenous Contrast Given 06/23/23 1157)  lactated ringers  bolus 500 mL (0 mLs Intravenous Stopped 06/23/23 1441)  HYDROmorphone  (DILAUDID ) injection 0.5 mg (0.5 mg Intravenous Given 06/23/23 1416)    ED Course/ Medical Decision Making/ A&P                                 Medical Decision Making Amount and/or Complexity of Data Reviewed Labs: ordered. Radiology: ordered. ECG/medicine tests: ordered.  Risk Prescription drug management. Decision regarding hospitalization.   This patient is a 58 year old female who presents to the ED for concern of lower abdominal pain x 2 days accompanied with 2 episodes of vomiting, noting that she has been more constipated recently with last bowel movement 3 days ago and "quarter sized" bowel movement last night.  Took magnesium citrate last night and vomited twice since then.  On physical exam, patient is in no acute distress, afebrile, alert and orient x 4, speaking in full sentences, nontachypneic.  Mild tachycardia noted with a heart rate of low 100s.  Notably tender over the left lower and right lower quadrants of the abdomen.  No CVA tenderness.  Oropharynx clear, LCTAB.  Mild right ankle edema noted, no issues being seen for decreased EF and has no tenderness over the ankle.  Exam is otherwise unremarkable.  CBC did note neutropenia and anemia with hemoglobin 8.1.  This is near baseline for patient however CT scan did show moderate stool burden and possible mild stercoral colitis.  With patient's neutropenia and current symptoms with failing enemas and now vomiting at home, will recommend that she be admitted for serial enemas and further monitoring due to her immunocompromise status.  Attempted to disimpact however was only able to remove minimal stool due to being unable to get to stool.  Patient care was then  transferred to Dr. Hildy Lowers, hospitalist.  Differential diagnoses prior to evaluation: The emergent differential diagnosis includes, but is not limited to, metastatic disease, SBO, LBO,  UTI, diverticulitis, pancreatitis, gastritis. This is not an exhaustive differential.   Past Medical History / Co-morbidities / Social History: Chronic cholecystitis, stage IV lung cancer on chemotherapy, COPD, radiation pneumonitis, neutropenia, pancreatitis, migraine.  Additional history: Chart reviewed. Pertinent results include:   Noted to have last chemotherapy infusion on 06/15/2023 for stage IV lung cancer.  Saw oncology same day noting to have been experiencing shortness of breath, numbness in right arm and leg and swelling around ankles at that time.  Noted to have a nontender belly at that time and was persistently mildly tachycardic.  Last CT of chest, abdomen, pelvis, neck done on 06/01/2023, noting continued reduction in size of supraclavicular lymph node and no evidence of new lymphadenopathy or metastatic disease.  Noting severe emphysema at that time.  Echo done on 06/10/2023 which notes an EF of 40 to 45% with mildly decreased LV function  Lab Tests/Imaging studies: I personally interpreted labs/imaging and the pertinent results include:   CBC shows neutropenia white count of 1 and anemia with hemoglobin of 8.1, similar to previous. CMP shows alk phos 168 similar to previous UA unremarkable Lipase unremarkable CT shows moderate stool burden with mild stercoral colitis  I agree with the radiologist interpretation.  Cardiac monitoring: EKG obtained and interpreted by myself and attending physician which shows: sinus rhythm  EKG Interpretation Date/Time:  Thursday Jun 23 2023 11:17:04 EDT Ventricular Rate:  99 PR Interval:  145 QRS Duration:  106 QT Interval:  357 QTC Calculation: 459 R Axis:   87  Text Interpretation: Sinus rhythm No acute changes No significant change since last  tracing Confirmed by Deatra Face [phone]) on 06/23/2023 12:19:59 PM          Medications: I ordered medication including morphine , Dilaudid , LR.  I have reviewed the patients home medicines and have made adjustments as needed.   Disposition: After consideration of the diagnostic results and the patients response to treatment, I feel that the patient would benefit from admission, patient care transferred over to Dr. Hildy Lowers patient will be transferred.    Final Clinical Impression(s) / ED Diagnoses Final diagnoses:  Stercoral colitis    Rx / DC Orders ED Discharge Orders     None         Hayes Lipps, PA-C 06/23/23 1517    Deatra Face, MD 06/25/23 1346

## 2023-06-23 NOTE — Care Plan (Signed)
 Plan of Care Note for accepted transfer   Patient: Julie Nicholson MRN: [id number]   DOA: 06/23/2023  Facility requesting transfer: [facility] Requesting Provider: Arden Beck, PA Reason for transfer: Stercoral colitis/pancytopenia Facility course: Patient is a unfortunate 58 year old female history of recurrent/metastatic non-small cell lung cancer on palliative systemic chemotherapy of carboplatin , Alimta, Keytruda  every 3 weeks with last treatment 06/15/2023, history of severe COPD, chronic cholecystitis, pancreatitis, radiation pneumonitis, hyperlipidemia presents to the ED with complaints of lower abdominal pain x 2 days with associated nausea and 2 episodes of emesis with decreased oral intake, constipation.  Patient also with generalized weakness.  Patient noted to have tried some laxatives including mag citrate with no significant results noted.  Patient with complaints of constipation over the last 7 days.  Patient seen in the ED with comprehensive metabolic profile with a glucose of 149, alk phosphatase of 168 otherwise unremarkable.  CBC with a pancytopenia.  CT abdomen and pelvis done concerning for moderate stool in the distal colon and mild stercoral colitis.   Plan of care: The patient is accepted for admission to Med-surg  unit, at [facility]..  Will likely need some enemas and laxatives as well as monitoring her pancytopenia.  Chest x-ray ordered in the ED pending to rule out aspiration pneumonia.  May need G-CSF until ANC greater than 1.5.  Author: Hilda Lovings, MD 06/23/2023  Check [email or web address] for on-call coverage.  Nursing staff, Please call [facility] Admits [facility] Consults [facility]-Wide number on Amion as soon as patient's arrival, so appropriate admitting provider can evaluate the pt.

## 2023-06-23 NOTE — Progress Notes (Signed)
   06/23/23 2112  Assess: MEWS Score  Temp 99.3 F (37.4 C)  BP (!) 93/58  MAP (mmHg) 70  Pulse Rate (!) 118  Resp 17  SpO2 94 %  O2 Device Room Air  Assess: MEWS Score  MEWS Temp 0  MEWS Systolic 1  MEWS Pulse 2  MEWS RR 0  MEWS LOC 0  MEWS Score 3  MEWS Score Color Yellow  Assess: if the MEWS score is Yellow or Red  Were vital signs accurate and taken at a resting state? Yes  Does the patient meet 2 or more of the SIRS criteria? No  MEWS guidelines implemented  Yes, yellow  Treat  MEWS Interventions Considered administering scheduled or prn medications/treatments as ordered  Take Vital Signs  Increase Vital Sign Frequency  Yellow: Q2hr x1, continue Q4hrs until patient remains green for 12hrs  Escalate  MEWS: Escalate Yellow: Discuss with charge nurse and consider notifying provider and/or RRT  Notify: Charge Nurse/RN  Name of Charge Nurse/RN Notified Adriana,RN  Provider Notification  Provider Name/Title A. Chavez,NP  Date Provider Notified 06/23/23  Time Provider Notified 2130  Method of Notification Page  Notification Reason Other (Comment) (yellow mews d/t tachy, soft BP)  Provider response See new orders  Date of Provider Response 06/23/23  Time of Provider Response 2130  Assess: SIRS CRITERIA  SIRS Temperature  0  SIRS Respirations  0  SIRS Pulse 1  SIRS WBC 0  SIRS Score Sum  1

## 2023-06-23 NOTE — ED Triage Notes (Signed)
 Patient arrives ambulatory to the ED with complaints of worsening constipation x1 week. Patient reports taking OTC meds with minimal relief. Took mag citrate and started vomiting.  Rates abdominal pain a 10/10.

## 2023-06-23 NOTE — ED Notes (Signed)
Ice chips per pt request

## 2023-06-23 NOTE — ED Notes (Signed)
Tequila with  cl called for transport 

## 2023-06-23 NOTE — Plan of Care (Signed)

## 2023-06-23 NOTE — H&P (Signed)
 History and Physical    Patient: Julie Nicholson WJX:[id number] DOB: Jun 26, 1965 DOA: 06/23/2023 DOS: the patient was seen and examined on 06/23/2023 PCP: Julie Simas, MD  Patient coming from: Home > [facility] > [facility]  Chief Complaint:  Chief Complaint  Patient presents with   Constipation   Emesis   HPI: Julie Nicholson is a 58 y.o. female with medical history significant of metastatic non-small cell lung cancer, COPD, chronic combined systolic and diastolic HF, GERD, hyperlipidemia, pancytopenia presenting with 2-day history of nausea, vomiting, abdominal pain.  Patient states that she does suffer from constipation at home.  This is typically relieved by home laxatives such as magnesium citrate and MiraLAX.  However, she has not passed a bowel movement since this past Monday.  2 days ago, she began experiencing nausea and nonbilious nonbloody emesis.  Yesterday, she developed abdominal pain that feels like gas is stuck inside and unable to exit.  States that she took MiraLAX and magnesium citrate yesterday but did not pass a bowel movement despite this.  Given worsening abdominal pain, she came to the ED for further evaluation and care. She denies any fevers, chills, chest pain, palpitations, SHOB, cough, diarrhea, urinary changes.   ED course: Vital signs stable aside from mild tachycardia.  CBC with pancytopenia.  CMP with glucose 149, chronically elevated alk phos.  Lipase normal.  UA unremarkable.  CT abdomen pelvis showing moderate stool in the distal colon consistent with mild stercoral colitis, no obstruction or perforation noted.  No evidence of metastatic disease in the abdomen or pelvis.  Patient given 500 cc IV fluid bolus, morphine , Dilaudid  in the ED.  Transferred to [facility] for further evaluation and care.  Review of Systems: As mentioned in the history of present illness. All other systems reviewed and are negative. Past Medical History:  Diagnosis Date   Abdominal  discomfort    Cancer (HCC)    Chronic headaches    due to allergies, sinus   COPD (chronic obstructive pulmonary disease) (HCC)    per 2012 chest xray   pt states she doesn not have this now (04/10/2013)   Deviated nasal septum    Eustachian tube dysfunction    GERD (gastroesophageal reflux disease)    occasional uses Tums / Rolaids   Hearing loss    right ear   High cholesterol    History of radiation therapy    right lung 01/07/2021-02/19/2021  Dr Julie Nicholson   History of radiation therapy    Lymph nodes of head,face,neck- 12/23/22-01/07/23- Dr. Retta Nicholson   Migraine    "only once in a blue moon since RX'd allergy  shots" (04/10/2013)   Pancreatitis 02/08/2013   Pneumonia    Rhinitis, allergic    Past Surgical History:  Procedure Laterality Date   ABDOMINAL HYSTERECTOMY  1995   tx endometriosis, both ovaries removed   ADENOIDECTOMY     APPENDECTOMY  late 1990's   BIOPSY  02/26/2021   Procedure: BIOPSY;  Surgeon: Julie Medford, MD;  Location: [facility] ENDOSCOPY;  Service: Endoscopy;;   BRONCHIAL BRUSHINGS  12/15/2020   Procedure: BRONCHIAL BRUSHINGS;  Surgeon: Julie Flake, MD;  Location: [facility] ENDOSCOPY;  Service: Cardiopulmonary;;   BRONCHIAL NEEDLE ASPIRATION BIOPSY  12/15/2020   Procedure: BRONCHIAL NEEDLE ASPIRATION BIOPSIES;  Surgeon: Julie Flake, MD;  Location: [facility] ENDOSCOPY;  Service: Cardiopulmonary;;   CHOLECYSTECTOMY  04/10/2013   CHOLECYSTECTOMY N/A 04/10/2013   Procedure: LAPAROSCOPIC CHOLECYSTECTOMY WITH INTRAOPERATIVE CHOLANGIOGRAM;  Surgeon: Julie Lichtenstein, MD;  Location: [facility] OR;  Service: General;  Laterality: N/A;   ELECTROMAGNETIC NAVIGATION BROCHOSCOPY  12/15/2020   Procedure: ELECTROMAGNETIC NAVIGATION BRONCHOSCOPY;  Surgeon: Julie Flake, MD;  Location: [facility] ENDOSCOPY;  Service: Cardiopulmonary;;   ESOPHAGOGASTRODUODENOSCOPY (EGD) WITH PROPOFOL  N/A 02/26/2021   Procedure: ESOPHAGOGASTRODUODENOSCOPY (EGD) WITH PROPOFOL ;  Surgeon: Julie Medford, MD;   Location: [facility] ENDOSCOPY;  Service: Endoscopy;  Laterality: N/A;   EUS N/A 02/16/2013   Procedure: UPPER ENDOSCOPIC ULTRASOUND (EUS) LINEAR;  Surgeon: Julie Medford, MD;  Location: [facility] ENDOSCOPY;  Service: Endoscopy;  Laterality: N/A;  radial linear   IR IMAGING GUIDED PORT INSERTION  06/30/2022   KNEE ARTHROSCOPY Right 1980's   "cartilage OR"   LAPAROSCOPIC ENDOMETRIOSIS FULGURATION  1980's   MYRINGOTOMY WITH TUBE PLACEMENT Right 07/13/2018   Procedure: MYRINGOTOMY WITH TUBE PLACEMENT;  Surgeon: Julie Sprout, MD;  Location[facility];  Service: ENT;  Laterality: Right;   MYRINGOTOMY WITH TUBE PLACEMENT Right 06/04/2021   Procedure: MYRINGOTOMY WITH BUTTERFLY TUBE PLACEMENT;  Surgeon: Julie Sprout, MD;  Location: [facility];  Service: ENT;  Laterality: Right;   NASOPHARYNGOSCOPY EUSTATION TUBE BALLOON DILATION Right 07/13/2018   Procedure: NASOPHARYNGOSCOPY EUSTATION TUBE BALLOON DILATION;  Surgeon: Julie Sprout, MD;  Location: [facility] SURGERY CNTR;  Service: ENT;  Laterality: Right;   TONSILLECTOMY AND ADENOIDECTOMY  ~ 1980   adenoidectomy   TUBAL LIGATION  ~ 1987   TURBINATE REDUCTION Right 07/13/2018   Procedure: OUTFRACTURE TURBINATE;  Surgeon: Julie Sprout, MD;  Location: [facility] SURGERY CNTR;  Service: ENT;  Laterality: Right;   TYMPANOSTOMY TUBE PLACEMENT     VIDEO BRONCHOSCOPY WITH ENDOBRONCHIAL ULTRASOUND N/A 12/15/2020   Procedure: ROBOTIC VIDEO BRONCHOSCOPY WITH ENDOBRONCHIAL ULTRASOUND;  Surgeon: Julie Flake, MD;  Location: [facility] ENDOSCOPY;  Service: Cardiopulmonary;  Laterality: N/A;   WRIST SURGERY Left    w/plate   Social History:  reports that she quit smoking about 2 years ago. Her smoking use included cigarettes. She started smoking about 43 years ago. She has a 20.5 pack-year smoking history. She has been exposed to tobacco smoke. She has never used smokeless tobacco. She reports that she does not currently use alcohol. She reports that she does not use  drugs.  Allergies  Allergen Reactions   Carboplatin  Shortness Of Breath, Itching and Cough    See progress note from 06/16/22    Family History  Problem Relation Age of Onset   Diabetes Mother    COPD Mother    Rectal cancer Mother    Cervical cancer Mother    Diabetes Father    COPD Maternal Aunt    COPD Maternal Uncle    COPD Maternal Grandfather     Prior to Admission medications   Medication Sig Start Date End Date Taking? Authorizing Provider  acetaminophen  (TYLENOL ) 500 MG tablet Take 1,000 mg by mouth every 6 (six) hours as needed for moderate pain.   Yes [provider]  albuterol  (PROVENTIL ) (2.5 MG/3ML) 0.083% nebulizer solution albuterol  sulfate 2.5 mg/3 mL (0.083 %) solution for nebulization  USE 1 VIAL IN NEBULIZER EVERY 6 HOURS AS NEEDED FOR WHEEZING OR SHORTNESS OF BREATH   Yes [provider]  albuterol  (VENTOLIN  HFA) 108 (90 Base) MCG/ACT inhaler Inhale 1-2 puffs into the lungs every 6 (six) hours as needed for wheezing or shortness of breath. 09/23/22  Yes Julie Flake, MD  aspirin EC 81 MG tablet Take 81 mg by mouth at bedtime.   Yes [provider]  atorvastatin  (LIPITOR) 40  MG tablet Take 40 mg by mouth at bedtime.   Yes [provider]  azelastine  (ASTELIN ) 0.1 % nasal spray Place 1 spray into both nostrils 2 (two) times daily as needed. Use in each nostril as directed 11/12/22  Yes Patel, Ammie Bale, MD  benzonatate  (TESSALON ) 200 MG capsule Take 1 capsule (200 mg total) by mouth 3 (three) times daily as needed. 07/07/22  Yes Julie Simas, MD  Biotin 5000 MCG TABS Take 5,000 mcg by mouth at bedtime.   Yes [provider]  buPROPion  (WELLBUTRIN  XL) 150 MG 24 hr tablet Take 150 mg by mouth at bedtime. 04/12/22  Yes [provider]  Ca Carbonate-Mag Hydroxide (ROLAIDS PO) Take 1 tablet by mouth daily as needed (heartburn).   Yes [provider]  cetirizine  (ZYRTEC  ALLERGY ) 10 MG tablet Take 1 tablet (10  mg total) by mouth daily as needed for rhinitis. 11/12/22  Yes Kandice Orleans, MD  famotidine  (PEPCID ) 20 MG tablet Take 1 tablet by mouth twice daily 05/03/23  Yes [name], Delora Ferry, MD  fluticasone  (FLONASE ) 50 MCG/ACT nasal spray Place 2 sprays into both nostrils daily. 04/21/23  Yes Julie Flake, MD  folic acid  (FOLVITE ) 1 MG tablet Take 1 tablet by mouth once daily 06/15/23  Yes Julie Simas, MD  furosemide (LASIX) 20 MG tablet Take 20 mg by mouth daily.   Yes [provider]  lidocaine -prilocaine  (EMLA ) cream APPLY TO PORT-A-CATH 30 TO 60 MINUTES BEFORE TREATMENT 09/27/22  Yes Julie Simas, MD  linaCLOtide (LINZESS PO) Take by mouth.   Yes [provider]  loratadine  (CLARITIN ) 10 MG tablet Take 10 mg by mouth daily. 05/15/23  Yes [provider]  Multiple Vitamins-Minerals (HAIR SKIN AND NAILS FORMULA PO) Take 1 tablet by mouth daily.   Yes [provider]  ondansetron  (ZOFRAN -ODT) 8 MG disintegrating tablet DISSOLVE 1 TABLET IN MOUTH THREE TIMES DAILY AS NEEDED 05/27/23  Yes Julie Simas, MD  Oxycodone  HCl 10 MG TABS Take 1 tablet by mouth 3 (three) times daily as needed (pain).   Yes [provider]  prochlorperazine  (COMPAZINE ) 10 MG tablet Take 1 tablet (10 mg total) by mouth every 6 (six) hours as needed. 12/29/22  Yes Heilingoetter, Cassandra L, PA-C  rizatriptan (MAXALT-MLT) 10 MG disintegrating tablet Take 10 mg by mouth as needed for migraine. May repeat in 2 hours if needed   Yes [provider]  sertraline (ZOLOFT) 50 MG tablet Take 1 tablet by mouth daily.   Yes [provider]  Tiotropium Bromide-Olodaterol (STIOLTO RESPIMAT ) 2.5-2.5 MCG/ACT AERS Inhale 2 puffs into the lungs daily. 09/23/22  Yes Julie Flake, MD  HYDROcodone  bit-homatropine [facility]) 5-1.5 MG/5ML syrup Take 5 mLs by mouth every 6 (six) hours as needed for cough. Patient not taking: Reported on 06/23/2023 03/23/23   Heilingoetter, Cassandra L,  PA-C  hydrocortisone  1 % ointment Apply 1 Application topically 2 (two) times daily. Patient not taking: Reported on 04/21/2023 04/19/22   Walisiewicz, Kaitlyn E, PA-C    Physical Exam: Vitals:   06/23/23 1400 06/23/23 1430 06/23/23 1500 06/23/23 1544  BP: 132/65 137/63 120/60 128/60  Pulse: 100 (!) 106 (!) 105 100  Resp: 14 15 16 18   Temp:    97.6 F (36.4 C)  TempSrc:    Oral  SpO2: 99% 99% 98% 100%  Weight:      Height:       Physical Exam Constitutional:      Comments: Chronically ill appearing female, laying  in bed, NAD.  HENT:     Head: Normocephalic and atraumatic.     Mouth/Throat:     Mouth: Mucous membranes are dry.     Pharynx: Oropharynx is clear. No oropharyngeal exudate.  Eyes:     General: No scleral icterus.    Extraocular Movements: Extraocular movements intact.     Conjunctiva/sclera: Conjunctivae normal.     Pupils: Pupils are equal, round, and reactive to light.  Cardiovascular:     Rate and Rhythm: Normal rate and regular rhythm.     Heart sounds: Normal heart sounds. No murmur heard.    No friction rub. No gallop.  Pulmonary:     Effort: Pulmonary effort is normal. No respiratory distress.     Breath sounds: Normal breath sounds. No wheezing, rhonchi or rales.  Abdominal:     Palpations: Abdomen is soft.     Tenderness: There is no guarding or rebound.     Comments: Hypoactive bowel sounds. Mild distension noted. Generalized tenderness to palpation.  Musculoskeletal:        General: No swelling. Normal range of motion.     Cervical back: Normal range of motion.  Skin:    General: Skin is warm and dry.  Neurological:     General: No focal deficit present.     Mental Status: She is alert and oriented to person, place, and time.  Psychiatric:        Mood and Affect: Mood normal.        Behavior: Behavior normal.     Data Reviewed:  There are no new results to review at this time.    Latest Ref Rng & Units 06/23/2023   11:08 AM 06/15/2023    10:29 AM 06/02/2023   10:13 AM  CBC  WBC 4.0 - 10.5 K/uL 1.0  3.0  1.1   Hemoglobin 12.0 - 15.0 g/dL 8.1  38.1  7.4   Hematocrit 36.0 - 46.0 % 24.0  30.3  20.8   Platelets 150 - 400 K/uL 112  187  59       Latest Ref Rng & Units 06/23/2023   11:08 AM 06/15/2023   10:29 AM 06/02/2023   10:13 AM  CMP  Glucose 70 - 99 mg/dL 829  937  88   BUN 6 - 20 mg/dL 11  13  11    Creatinine 0.44 - 1.00 mg/dL 1.69  6.78  9.38   Sodium 135 - 145 mmol/L 139  142  140   Potassium 3.5 - 5.1 mmol/L 3.8  3.6  4.0   Chloride 98 - 111 mmol/L 101  106  106   CO2 22 - 32 mmol/L 27  30  30    Calcium  8.9 - 10.3 mg/dL 9.5  9.5  9.2   Total Protein 6.5 - 8.1 g/dL 6.9  7.0  6.8   Total Bilirubin 0.0 - 1.2 mg/dL 0.5  0.5  0.5   Alkaline Phos 38 - 126 U/L 168  134  167   AST 15 - 41 U/L 38  22  99   ALT 0 - 44 U/L 37  24  147    Lipase     Component Value Date/Time   LIPASE 33 06/23/2023 1108   Urinalysis    Component Value Date/Time   COLORURINE YELLOW 06/23/2023 1111   APPEARANCEUR CLEAR 06/23/2023 1111   LABSPEC 1.015 06/23/2023 1111   PHURINE 7.0 06/23/2023 1111   GLUCOSEU NEGATIVE 06/23/2023 1111   GLUCOSEU NEGATIVE 03/01/2013 0733  HGBUR NEGATIVE 06/23/2023 1111   BILIRUBINUR NEGATIVE 06/23/2023 1111   KETONESUR NEGATIVE 06/23/2023 1111   PROTEINUR NEGATIVE 06/23/2023 1111   UROBILINOGEN 0.2 03/01/2013 0733   NITRITE NEGATIVE 06/23/2023 1111   LEUKOCYTESUR NEGATIVE 06/23/2023 1111    CT ABDOMEN PELVIS W CONTRAST Result Date: 06/23/2023 CLINICAL DATA:  Left lower quadrant abdominal pain. Non-small cell lung cancer. Metastatic disease evaluation. EXAM: CT ABDOMEN AND PELVIS WITH CONTRAST TECHNIQUE: Multidetector CT imaging of the abdomen and pelvis was performed using the standard protocol following bolus administration of intravenous contrast. RADIATION DOSE REDUCTION: This exam was performed according to the departmental dose-optimization program which includes automated exposure control,  adjustment of the mA and/or kV according to patient size and/or use of iterative reconstruction technique. CONTRAST:  OMNIPAQUE  IOHEXOL  300 MG/ML  SOLN COMPARISON:  CT of the chest abdomen pelvis dated 06/01/2023. FINDINGS: Lower chest: The visualized lung bases are clear. No intra-abdominal free air or free fluid. Hepatobiliary: The liver is unremarkable. There is mild biliary dilatation, post cholecystectomy. No retained calcified stone noted in the central CBD. Pancreas: Unremarkable. No pancreatic ductal dilatation or surrounding inflammatory changes. Spleen: Normal in size without focal abnormality. Adrenals/Urinary Tract: The adrenal glands are unremarkable. There is no hydronephrosis on either side. There is symmetric enhancement and excretion of contrast by both kidneys. The visualized ureters and urinary bladder appear unremarkable Stomach/Bowel: There is moderate stool in the distal colon. Mild pericolonic haziness along the descending and sigmoid colon may represent mild stercoral colitis. Clinical correlation is recommended. No bowel obstruction. Appendectomy. Vascular/Lymphatic: Mild aortoiliac atherosclerotic disease. The IVC is unremarkable. No portal venous gas. There is no adenopathy. Reproductive: Hysterectomy.  No suspicious adnexal masses. Other: None Musculoskeletal: No acute or significant osseous findings. IMPRESSION: 1. Moderate stool in the distal colon with findings of possible mild stercoral colitis. Clinical correlation is recommended. No bowel obstruction. 2. No evidence of metastatic disease in the abdomen or pelvis. 3.  Aortic Atherosclerosis (ICD10-I70.0). Electronically Signed   By: Angus Bark M.D.   On: 06/23/2023 12:36    Assessment and Plan: No notes have been filed under this hospital service. Service: Hospitalist  Stercoral Colitis Patient presenting with nausea, vomiting, generalized abdominal pain, and constipation unrelieved with home laxatives.  CT  imaging showing mild stercoral colitis without any evidence of perforation or obstruction. Constipation likely worsened from outpatient opiate use for pain control from metastatic lung cancer.  She is hemodynamically stable and exam without any signs of peritonitis. Will provide tapwater enema to help promote bowel movement.  Will also add senna-s qhs. She remains dry on exam despite a 500cc IVF bolus given in ED, will provide an additional 1L IVF for rehydration. - Tap water enema - senna-s at bedtime - 1L IVF bolus - Zofran  every 6 hours as needed for nausea - Heart healthy diet - Lovenox  for DVT prophylaxis - place in observation - consider surgical consultation if worsening abdominal pain, clinical decompensation, or signs of peritonitis on exam  Metastatic non-small cell lung cancer - Patient has completed chemoradiation, consolidation immunotherapy, and palliative radiotherapy - She is now on palliative systemic chemotherapy - Follows Dr. Liam Redhead with oncology - PDMP reviewed, resume home oxycodone  10 mg every 8 hours as needed  Pancytopenia - Stable compared to prior labs (aside from mildly decreased WBC compared to baseline), no signs of infection but presenting with stercoral colitis - Likely secondary to metastatic non-small cell lung cancer and/or chemoradiation -trend CBC  COPD - Chronic and stable, no  wheezing on exam, saturating well on room air - Resume home bronchodilators  Chronic combined systolic and diastolic HF Echo earlier this month showing LVEF 40 to 45%, mildly decreased LV function, global hypokinesis, grade 1 diastolic dysfunction.  She is not on any GDMT for this in the outpatient setting but is on Lasix 20 mg daily.  Per recent oncology note, has been referred to cardiology in the outpatient setting.  She is dry on exam. - Holding home Lasix - Status post 0.5 L IV fluid bolus, providing an additional 1 L IV fluid bolus for rehydration - Strict I/O's/daily  weights  Hyperglycemia No prior history of diabetes and no recent A1c on file. Blood glucose 149 on CMP today. -f/u A1c -trend CBGs every morning, goal 140-180 -consider adding SSI if CBGs above goal  CAD HLD - Resume home Lipitor 40 mg nightly - Resume home baby aspirin  GERD - Resume home Pepcid  20 mg twice daily   Advance Care Planning:   Code Status: Full Code   Consults: none  Family Communication: no family at bedside  Severity of Illness: The appropriate patient status for this patient is OBSERVATION. Observation status is judged to be reasonable and necessary in order to provide the required intensity of service to ensure the patient's safety. The patient's presenting symptoms, physical exam findings, and initial radiographic and laboratory data in the context of their medical condition is felt to place them at decreased risk for further clinical deterioration. Furthermore, it is anticipated that the patient will be medically stable for discharge from the hospital within 2 midnights of admission.   Portions of this note were generated with Scientist, clinical (histocompatibility and immunogenetics). Dictation errors may occur despite best attempts at proofreading.  Author: Tyreka Henneke H Elisabetta Mishra, MD 06/23/2023 4:15 PM  For on call review [email or web address].

## 2023-06-24 ENCOUNTER — Ambulatory Visit: Admitting: Cardiology

## 2023-06-24 ENCOUNTER — Telehealth: Payer: Self-pay

## 2023-06-24 ENCOUNTER — Observation Stay (HOSPITAL_COMMUNITY)

## 2023-06-24 DIAGNOSIS — C3491 Malignant neoplasm of unspecified part of right bronchus or lung: Secondary | ICD-10-CM

## 2023-06-24 DIAGNOSIS — I700 Atherosclerosis of aorta: Secondary | ICD-10-CM

## 2023-06-24 DIAGNOSIS — K5903 Drug induced constipation: Secondary | ICD-10-CM | POA: Diagnosis not present

## 2023-06-24 DIAGNOSIS — K5289 Other specified noninfective gastroenteritis and colitis: Secondary | ICD-10-CM | POA: Diagnosis not present

## 2023-06-24 DIAGNOSIS — D61818 Other pancytopenia: Secondary | ICD-10-CM | POA: Diagnosis not present

## 2023-06-24 HISTORY — DX: Atherosclerosis of aorta: I70.0

## 2023-06-24 LAB — CBC
HCT: 22.2 % — ABNORMAL LOW (ref 36.0–46.0)
Hemoglobin: 7.3 g/dL — ABNORMAL LOW (ref 12.0–15.0)
MCH: 34.9 pg — ABNORMAL HIGH (ref 26.0–34.0)
MCHC: 32.9 g/dL (ref 30.0–36.0)
MCV: 106.2 fL — ABNORMAL HIGH (ref 80.0–100.0)
Platelets: 80 10*3/uL — ABNORMAL LOW (ref 150–400)
RBC: 2.09 MIL/uL — ABNORMAL LOW (ref 3.87–5.11)
RDW: 17.1 % — ABNORMAL HIGH (ref 11.5–15.5)
WBC: 2 10*3/uL — ABNORMAL LOW (ref 4.0–10.5)
nRBC: 0 % (ref 0.0–0.2)

## 2023-06-24 LAB — BASIC METABOLIC PANEL WITH GFR
Anion gap: 7 (ref 5–15)
BUN: 10 mg/dL (ref 6–20)
CO2: 28 mmol/L (ref 22–32)
Calcium: 8.6 mg/dL — ABNORMAL LOW (ref 8.9–10.3)
Chloride: 102 mmol/L (ref 98–111)
Creatinine, Ser: 0.9 mg/dL (ref 0.44–1.00)
GFR, Estimated: >60 mL/min (ref >60)
Glucose, Bld: 99 mg/dL (ref 70–99)
Potassium: 3.6 mmol/L (ref 3.5–5.1)
Sodium: 137 mmol/L (ref 135–145)

## 2023-06-24 LAB — GLUCOSE, CAPILLARY: Glucose-Capillary: 103 mg/dL — ABNORMAL HIGH (ref 70–99)

## 2023-06-24 MED ORDER — POLYETHYLENE GLYCOL 3350 17 G PO PACK
17.0000 g | PACK | ORAL | Status: AC
  Administered 2023-06-24 (×3): 17 g via ORAL
  Filled 2023-06-24 (×3): qty 1

## 2023-06-24 MED ORDER — SIMETHICONE 80 MG PO CHEW
80.0000 mg | CHEWABLE_TABLET | Freq: Four times a day (QID) | ORAL | Status: DC | PRN
  Administered 2023-06-24: 80 mg via ORAL
  Filled 2023-06-24: qty 1

## 2023-06-24 MED ORDER — MINERAL OIL RE ENEM
1.0000 | ENEMA | Freq: Once | RECTAL | Status: AC
  Administered 2023-06-24: 1 via RECTAL
  Filled 2023-06-24: qty 1

## 2023-06-24 NOTE — Progress Notes (Signed)
   06/24/23 1225  TOC Brief Assessment  Insurance and Status Reviewed  Patient has primary care physician Yes  Home environment has been reviewed Single family home  Prior level of function: Independent  Prior/Current Home Services No current home services  Social Drivers of Health Review SDOH reviewed no interventions necessary  Readmission risk has been reviewed Yes (N/A)  Transition of care needs no transition of care needs at this time   Met with pt to discuss SDOH needs for utilities and pt denies having any SDOH needs. States she just needs something for the pain she is having. Pt denies any DME/ HH needs at this time. TOC has not identified any needs at this time. If needs arise in the future, please place a consult.

## 2023-06-24 NOTE — Progress Notes (Signed)
  Progress Note   Patient: Julie Nicholson ZOX:[id number] DOB: 1966-01-21 DOA: 06/23/2023     0 DOS: the patient was seen and examined on 06/24/2023   Brief hospital course: 58 year old woman PMH including metastatic non-small cell lung cancer, pancytopenia, presented with nausea vomiting and abdominal pain.  Noted to have constipation at home.  CT abdomen pelvis showed moderate stool, mild stercoral colitis without complicating features.  Given comorbidities, admitted for further evaluation and treatment.  Consultants None   Procedures/Events None   Assessment and Plan: Stercoral Colitis Constipation Presented with nausea vomiting and abdominal pain.  CT showed mild stercoral colitis without complicating features Although on recorded, patient reports responded well to initial enema, however now not passing gas and having abdominal discomfort and distention. Repeat x-ray shows moderate gas and stool throughout the colon with slight decrease in stool burden Continue MiraLAX, repeat enema    Metastatic non-small cell lung cancer Status post chemoradiation, consolidation immunotherapy, and palliative radiotherapy Now on palliative systemic chemotherapy under care of Dr. Marguerita Shih   Pancytopenia Secondary to chemotherapy Trend CBC   COPD Stable.   Bronchodilators   Chronic combined systolic and diastolic HF Echo earlier this month showing LVEF 40 to 45%, mildly decreased LV function, global hypokinesis, grade 1 diastolic dysfunction.  She is not on any GDMT for this in the outpatient setting but is on Lasix 20 mg daily.  Per recent oncology note, has been referred to cardiology in the outpatient setting.  Holding home Lasix I/O   Random hyperglycemia Hemoglobin A1c within normal limits. No further evaluation suggested   CAD HLD Continue Lipitor and aspirin  GERD Pepcid  20 mg twice daily  Aortic Atherosclerosis      Subjective:  Large bowel movement yesterday.  None  today.  No gas today.  More distended and bloated today.  Physical Exam: Vitals:   06/24/23 0500 06/24/23 0516 06/24/23 0924 06/24/23 1342  BP:  116/62 113/63 116/65  Pulse:  (!) 102 (!) 113 (!) 102  Resp:  16 18 18   Temp:  98.1 F (36.7 C) 98.5 F (36.9 C) (!) 97.5 F (36.4 C)  TempSrc:    Oral  SpO2:  95% 99% 98%  Weight: 47.6 kg     Height:       Physical Exam Vitals reviewed.  Constitutional:      General: She is not in acute distress.    Appearance: She is not ill-appearing or toxic-appearing.  Cardiovascular:     Rate and Rhythm: Normal rate and regular rhythm.     Heart sounds: No murmur heard. Pulmonary:     Effort: Pulmonary effort is normal. No respiratory distress.     Breath sounds: No wheezing, rhonchi or rales.  Abdominal:     General: There is distension.     Tenderness: There is abdominal tenderness.  Neurological:     Mental Status: She is alert.  Psychiatric:        Mood and Affect: Mood normal.        Behavior: Behavior normal.     Data Reviewed: Basic metabolic panel noted LFTs were elevated yesterday although this may have been a spurious test given they were normal earlier in the day WBC trending up, 2.0 Hemoglobin stable at 7.3 Platelets down slightly to 80  Family Communication: none  Disposition: Status is: Observation      Time spent: 35 minutes  Author: Jerline Moon, MD 06/24/2023 3:42 PM  For on call review [email or web address].

## 2023-06-24 NOTE — Telephone Encounter (Signed)
 Pt called to make Dr Bing Buff office aware she's admitted in [facility].  Stated this nurse will make Dr. Marguerita Shih and his team aware of the pt's admission.  Pt verbalized understanding and had no further questions or concerns.

## 2023-06-24 NOTE — Hospital Course (Addendum)
 58 year old woman PMH including metastatic non-small cell lung cancer, pancytopenia, presented with nausea vomiting and abdominal pain.  Noted to have constipation at home.  CT abdomen pelvis showed moderate stool, mild stercoral colitis without complicating features.  Given comorbidities, admitted for further evaluation and treatment.  Consultants None   Procedures/Events None

## 2023-06-24 NOTE — Plan of Care (Signed)
 ?  Problem: Clinical Measurements: ?Goal: Ability to maintain clinical measurements within normal limits will improve ?Outcome: Progressing ?Goal: Will remain free from infection ?Outcome: Progressing ?Goal: Diagnostic test results will improve ?Outcome: Progressing ?  ?

## 2023-06-25 DIAGNOSIS — C3490 Malignant neoplasm of unspecified part of unspecified bronchus or lung: Secondary | ICD-10-CM | POA: Diagnosis not present

## 2023-06-25 DIAGNOSIS — K5903 Drug induced constipation: Secondary | ICD-10-CM | POA: Diagnosis not present

## 2023-06-25 DIAGNOSIS — C3491 Malignant neoplasm of unspecified part of right bronchus or lung: Secondary | ICD-10-CM

## 2023-06-25 DIAGNOSIS — K5289 Other specified noninfective gastroenteritis and colitis: Secondary | ICD-10-CM | POA: Diagnosis not present

## 2023-06-25 DIAGNOSIS — D61818 Other pancytopenia: Secondary | ICD-10-CM | POA: Diagnosis not present

## 2023-06-25 DIAGNOSIS — I700 Atherosclerosis of aorta: Secondary | ICD-10-CM

## 2023-06-25 LAB — CBC
HCT: 21.3 % — ABNORMAL LOW (ref 36.0–46.0)
Hemoglobin: 7 g/dL — ABNORMAL LOW (ref 12.0–15.0)
MCH: 34.7 pg — ABNORMAL HIGH (ref 26.0–34.0)
MCHC: 32.9 g/dL (ref 30.0–36.0)
MCV: 105.4 fL — ABNORMAL HIGH (ref 80.0–100.0)
Platelets: 69 10*3/uL — ABNORMAL LOW (ref 150–400)
RBC: 2.02 MIL/uL — ABNORMAL LOW (ref 3.87–5.11)
RDW: 16.9 % — ABNORMAL HIGH (ref 11.5–15.5)
WBC: 1.9 10*3/uL — ABNORMAL LOW (ref 4.0–10.5)
nRBC: 0 % (ref 0.0–0.2)

## 2023-06-25 LAB — COMPREHENSIVE METABOLIC PANEL WITH GFR
ALT: 55 U/L — ABNORMAL HIGH (ref 0–44)
AST: 43 U/L — ABNORMAL HIGH (ref 15–41)
Albumin: 2.5 g/dL — ABNORMAL LOW (ref 3.5–5.0)
Alkaline Phosphatase: 176 U/L — ABNORMAL HIGH (ref 38–126)
Anion gap: 6 (ref 5–15)
BUN: 9 mg/dL (ref 6–20)
CO2: 28 mmol/L (ref 22–32)
Calcium: 8.3 mg/dL — ABNORMAL LOW (ref 8.9–10.3)
Chloride: 102 mmol/L (ref 98–111)
Creatinine, Ser: 0.91 mg/dL (ref 0.44–1.00)
GFR, Estimated: >60 mL/min (ref >60)
Glucose, Bld: 88 mg/dL (ref 70–99)
Potassium: 3.5 mmol/L (ref 3.5–5.1)
Sodium: 136 mmol/L (ref 135–145)
Total Bilirubin: 0.7 mg/dL (ref 0.0–1.2)
Total Protein: 5.9 g/dL — ABNORMAL LOW (ref 6.5–8.1)

## 2023-06-25 MED ORDER — SENNOSIDES-DOCUSATE SODIUM 8.6-50 MG PO TABS: 2.0000 | ORAL_TABLET | Freq: Two times a day (BID) | ORAL | Status: DC | PRN

## 2023-06-25 MED ORDER — POLYETHYLENE GLYCOL 3350 17 GM/SCOOP PO POWD: 17.0000 g | Freq: Two times a day (BID) | ORAL | 1 refills | Status: AC | PRN

## 2023-06-25 NOTE — Plan of Care (Signed)

## 2023-06-25 NOTE — Progress Notes (Signed)
Pt was discharged home today. Instructions were reviewed with patient, and questions were answered. Pt was taken to main entrance via wheelchair by NT.  

## 2023-06-25 NOTE — Discharge Summary (Signed)
 Physician Discharge Summary  Julie Nicholson WGN:[id number] DOB: July 18, 1965 DOA: 06/23/2023  PCP: Marlene Simas, MD  Admit date: 06/23/2023 Discharge date: 06/25/23  Admitted From: Home Disposition: Home Recommendations for Outpatient Follow-up:  Follow up with PCP in 1 week Check CMP and CBC at follow-up Please follow up on the following pending results: None  Home Health: No need identified Equipment/Devices: No need identified  Discharge Condition: Stable CODE STATUS: Full code  Follow-up Information     Marlene Simas, MD. Schedule an appointment as soon as possible for a visit in 1 week(s).   Specialty: Oncology Contact information: [address] [place] [address] [phone]                 Hospital course 58 year old F with PMH of NSCLC s/p chemoradiation, immunotherapy and currently on palliative systemic chemotherapy, COPD, combined CHF, CAD, HLD, GERD, pancytopenia and constipation presenting with nausea, vomiting, abdominal pain and constipation.  CT abdomen and pelvis showed moderate stool, mild sterile coral colitis without complicating features.  She was admitted and started on aggressive bowel regimen.  Patient has had multiple bowel movements with improvement in her abdominal pain and resolution of nausea and vomiting.  She is already on Linzess at home.  Added scheduled senna with as needed MiraLAX to maintain good bowel habits while on opiates.  See individual problem list below for more.   Problems addressed during this hospitalization Principal Problem:   Stercoral colitis Active Problems:   Adenocarcinoma of right lung, stage 4 (HCC)   Pancytopenia (HCC)   Constipation   Metastatic non-small cell lung cancer (HCC)   Aortic atherosclerosis (HCC)             Time spent 35 minutes  Vital signs Vitals:   06/24/23 2130 06/25/23 0219 06/25/23 0500 06/25/23 0547  BP: 131/81 (!) 97/54  112/71  Pulse: (!) 115 (!) 111   (!) 110  Temp: 98 F (36.7 C) 98.6 F (37 C)  98.5 F (36.9 C)  Resp: 18 14  18   Height:      Weight:   47.1 kg   SpO2: 100% 94%  97%  TempSrc: Oral Oral  Oral  BMI (Calculated):   18.99      Discharge exam  GENERAL: No apparent distress.  Nontoxic. HEENT: MMM.  Vision and hearing grossly intact.  NECK: Supple.  No apparent JVD.  RESP:  No IWOB.  Fair aeration bilaterally. CVS:  RRR. Heart sounds normal.  ABD/GI/GU: BS+. Abd soft.  Mild diffuse tenderness.  No rebound or guarding. MSK/EXT:  Moves extremities. No apparent deformity. No edema.  SKIN: no apparent skin lesion or wound NEURO: Awake and alert. Oriented appropriately.  No apparent focal neuro deficit. PSYCH: Calm. Normal affect.   Discharge Instructions Discharge Instructions     Diet - low sodium heart healthy   Complete by: As directed    Discharge instructions   Complete by: As directed    It has been a pleasure taking care of you!  You were hospitalized severe constipation likely due to opiates/oxycodone .  You have been treated with bowel regimen.  Continue using the Linzess.  Can also use MiraLAX and Senokot as needed.  Maintain good hydration.  Eat foods rich in fibers.  Follow-up with your primary care doctor in 1 to 2 weeks or sooner if needed.   Take care,   Increase activity slowly   Complete by: As directed       Allergies as of  06/25/2023       Reactions   Carboplatin  Shortness Of Breath, Itching, Cough   See progress note from 06/16/22        Medication List     STOP taking these medications    cetirizine  10 MG tablet Commonly known as: ZyrTEC  Allergy    HYDROcodone  bit-homatropine 5-1.5 MG/5ML syrup Commonly known as: Hycodan   hydrocortisone  1 % ointment       TAKE these medications    acetaminophen  500 MG tablet Commonly known as: TYLENOL  Take 1,000 mg by mouth every 6 (six) hours as needed for moderate pain.   albuterol  (2.5 MG/3ML) 0.083% nebulizer solution Commonly  known as: PROVENTIL  albuterol  sulfate 2.5 mg/3 mL (0.083 %) solution for nebulization  USE 1 VIAL IN NEBULIZER EVERY 6 HOURS AS NEEDED FOR WHEEZING OR SHORTNESS OF BREATH   albuterol  108 (90 Base) MCG/ACT inhaler Commonly known as: VENTOLIN  HFA Inhale 1-2 puffs into the lungs every 6 (six) hours as needed for wheezing or shortness of breath.   aspirin EC 81 MG tablet Take 81 mg by mouth at bedtime.   atorvastatin  40 MG tablet Commonly known as: LIPITOR Take 40 mg by mouth at bedtime.   azelastine  0.1 % nasal spray Commonly known as: ASTELIN  Place 1 spray into both nostrils 2 (two) times daily as needed. Use in each nostril as directed   benzonatate  200 MG capsule Commonly known as: TESSALON  Take 1 capsule (200 mg total) by mouth 3 (three) times daily as needed.   Biotin 5000 MCG Tabs Take 5,000 mcg by mouth at bedtime.   buPROPion  150 MG 24 hr tablet Commonly known as: WELLBUTRIN  XL Take 150 mg by mouth at bedtime.   famotidine  20 MG tablet Commonly known as: PEPCID  Take 1 tablet by mouth twice daily   fluticasone  50 MCG/ACT nasal spray Commonly known as: FLONASE  Place 2 sprays into both nostrils daily.   folic acid  1 MG tablet Commonly known as: FOLVITE  Take 1 tablet by mouth once daily   furosemide 20 MG tablet Commonly known as: LASIX Take 20 mg by mouth daily.   HAIR SKIN AND NAILS FORMULA PO Take 1 tablet by mouth daily.   lidocaine -prilocaine  cream Commonly known as: EMLA  APPLY TO PORT-A-CATH 30 TO 60 MINUTES BEFORE TREATMENT   LINZESS PO Take by mouth.   loratadine  10 MG tablet Commonly known as: CLARITIN  Take 10 mg by mouth daily.   ondansetron  8 MG disintegrating tablet Commonly known as: ZOFRAN -ODT DISSOLVE 1 TABLET IN MOUTH THREE TIMES DAILY AS NEEDED   Oxycodone  HCl 10 MG Tabs Take 1 tablet by mouth 3 (three) times daily as needed (pain).   polyethylene glycol powder 17 GM/SCOOP powder Commonly known as: MiraLax Take 17 g by mouth 2  (two) times daily as needed for mild constipation.   prochlorperazine  10 MG tablet Commonly known as: COMPAZINE  Take 1 tablet (10 mg total) by mouth every 6 (six) hours as needed.   rizatriptan 10 MG disintegrating tablet Commonly known as: MAXALT-MLT Take 10 mg by mouth as needed for migraine. May repeat in 2 hours if needed   ROLAIDS PO Take 1 tablet by mouth daily as needed (heartburn).   senna-docusate 8.6-50 MG tablet Commonly known as: Senokot-S Take 2 tablets by mouth 2 (two) times daily between meals as needed for moderate constipation.   sertraline 50 MG tablet Commonly known as: ZOLOFT Take 1 tablet by mouth daily.   Stiolto Respimat  2.5-2.5 MCG/ACT Aers Generic drug: Tiotropium Bromide-Olodaterol Inhale 2  puffs into the lungs daily.        Consultations: None  Procedures/Studies:   DG Abd Portable 1V Result Date: 06/24/2023 CLINICAL DATA:  Constipation for 2 days, abdominal distension, pain EXAM: PORTABLE ABDOMEN - 1 VIEW COMPARISON:  06/23/2023 FINDINGS: Two supine frontal views of the abdomen and pelvis are obtained. There is residual gas and stool throughout the colon, with mild decrease in stool burden since prior study. No evidence of small-bowel obstruction or ileus. There are no masses or abnormal calcifications. Lung bases are clear. No acute bony abnormalities. IMPRESSION: 1. Moderate gas and stool throughout the colon, with slight decrease in stool burden since prior study. Electronically Signed   By: Bobbye Burrow M.D.   On: 06/24/2023 15:04   CT ABDOMEN PELVIS W CONTRAST Result Date: 06/23/2023 CLINICAL DATA:  Left lower quadrant abdominal pain. Non-small cell lung cancer. Metastatic disease evaluation. EXAM: CT ABDOMEN AND PELVIS WITH CONTRAST TECHNIQUE: Multidetector CT imaging of the abdomen and pelvis was performed using the standard protocol following bolus administration of intravenous contrast. RADIATION DOSE REDUCTION: This exam was performed  according to the departmental dose-optimization program which includes automated exposure control, adjustment of the mA and/or kV according to patient size and/or use of iterative reconstruction technique. CONTRAST:  OMNIPAQUE  IOHEXOL  300 MG/ML  SOLN COMPARISON:  CT of the chest abdomen pelvis dated 06/01/2023. FINDINGS: Lower chest: The visualized lung bases are clear. No intra-abdominal free air or free fluid. Hepatobiliary: The liver is unremarkable. There is mild biliary dilatation, post cholecystectomy. No retained calcified stone noted in the central CBD. Pancreas: Unremarkable. No pancreatic ductal dilatation or surrounding inflammatory changes. Spleen: Normal in size without focal abnormality. Adrenals/Urinary Tract: The adrenal glands are unremarkable. There is no hydronephrosis on either side. There is symmetric enhancement and excretion of contrast by both kidneys. The visualized ureters and urinary bladder appear unremarkable Stomach/Bowel: There is moderate stool in the distal colon. Mild pericolonic haziness along the descending and sigmoid colon may represent mild stercoral colitis. Clinical correlation is recommended. No bowel obstruction. Appendectomy. Vascular/Lymphatic: Mild aortoiliac atherosclerotic disease. The IVC is unremarkable. No portal venous gas. There is no adenopathy. Reproductive: Hysterectomy.  No suspicious adnexal masses. Other: None Musculoskeletal: No acute or significant osseous findings. IMPRESSION: 1. Moderate stool in the distal colon with findings of possible mild stercoral colitis. Clinical correlation is recommended. No bowel obstruction. 2. No evidence of metastatic disease in the abdomen or pelvis. 3.  Aortic Atherosclerosis (ICD10-I70.0). Electronically Signed   By: Angus Bark M.D.   On: 06/23/2023 12:36   ECHOCARDIOGRAM COMPLETE Result Date: 06/10/2023    ECHOCARDIOGRAM REPORT   Patient Name:   Julie Nicholson Date of Exam: 06/10/2023 Medical Rec #:   [id number]        Height:       62.0 in Accession #:    [id number]       Weight:       107.6 lb Date of Birth:  July 20, 1965        BSA:          1.469 m Patient Age:    57 years         BP:           112/72 mmHg Patient Gender: F                HR:           95 bpm. Exam Location:  Outpatient Procedure: 2D Echo, Cardiac Doppler and Color  Doppler (Both Spectral and Color            Flow Doppler were utilized during procedure). Indications:    I50.21 Acute systolic (congestive) heart failure  History:        Patient has no prior history of Echocardiogram examinations.                 COPD.  Sonographer:    Andrena Bang Referring Phys: [id number] ROBERT S [name] IMPRESSIONS  1. Left ventricular ejection fraction, by estimation, is 40 to 45%. The left ventricle has mildly decreased function. The left ventricle demonstrates global hypokinesis. The left ventricular internal cavity size was mildly dilated. Left ventricular diastolic parameters are consistent with Grade I diastolic dysfunction (impaired relaxation).  2. Right ventricular systolic function is normal. The right ventricular size is normal.  3. The mitral valve is abnormal. Trivial mitral valve regurgitation. No evidence of mitral stenosis.  4. The aortic valve is tricuspid. There is mild calcification of the aortic valve. Aortic valve regurgitation is mild. Aortic valve sclerosis is present, with no evidence of aortic valve stenosis.  5. The inferior vena cava is normal in size with greater than 50% respiratory variability, suggesting right atrial pressure of 3 mmHg. FINDINGS  Left Ventricle: Left ventricular ejection fraction, by estimation, is 40 to 45%. The left ventricle has mildly decreased function. The left ventricle demonstrates global hypokinesis. Strain was performed and the global longitudinal strain is indeterminate. The left ventricular internal cavity size was mildly dilated. There is no left ventricular hypertrophy. Left ventricular diastolic parameters  are consistent with Grade I diastolic dysfunction (impaired relaxation). Right Ventricle: The right ventricular size is normal. No increase in right ventricular wall thickness. Right ventricular systolic function is normal. Left Atrium: Left atrial size was normal in size. Right Atrium: Right atrial size was normal in size. Pericardium: There is no evidence of pericardial effusion. Mitral Valve: The mitral valve is abnormal. There is mild thickening of the mitral valve leaflet(s). Trivial mitral valve regurgitation. No evidence of mitral valve stenosis. Tricuspid Valve: The tricuspid valve is normal in structure. Tricuspid valve regurgitation is mild . No evidence of tricuspid stenosis. Aortic Valve: The aortic valve is tricuspid. There is mild calcification of the aortic valve. Aortic valve regurgitation is mild. Aortic valve sclerosis is present, with no evidence of aortic valve stenosis. Aortic valve mean gradient measures 2.0 mmHg. Aortic valve peak gradient measures 5.2 mmHg. Aortic valve area, by VTI measures 2.22 cm. Pulmonic Valve: The pulmonic valve was normal in structure. Pulmonic valve regurgitation is trivial. No evidence of pulmonic stenosis. Aorta: The aortic root is normal in size and structure. Venous: The inferior vena cava is normal in size with greater than 50% respiratory variability, suggesting right atrial pressure of 3 mmHg. IAS/Shunts: No atrial level shunt detected by color flow Doppler. Additional Comments: 3D was performed not requiring image post processing on an independent workstation and was indeterminate.  LEFT VENTRICLE PLAX 2D LVIDd:         4.30 cm     Diastology LVIDs:         3.60 cm     LV e' medial:    6.74 cm/s LV PW:         0.80 cm     LV E/e' medial:  8.2 LV IVS:        0.80 cm     LV e' lateral:   7.51 cm/s LVOT diam:     1.80 cm  LV E/e' lateral: 7.4 LV SV:         39 LV SV Index:   26 LVOT Area:     2.54 cm  LV Volumes (MOD) LV vol d, MOD A2C: 58.4 ml LV vol d, MOD  A4C: 64.3 ml LV vol s, MOD A2C: 33.7 ml LV vol s, MOD A4C: 38.4 ml LV SV MOD A2C:     24.7 ml LV SV MOD A4C:     64.3 ml LV SV MOD BP:      25.7 ml RIGHT VENTRICLE RV S prime:     9.57 cm/s TAPSE (M-mode): 1.3 cm LEFT ATRIUM             Index LA diam:        2.40 cm 1.63 cm/m LA Vol (A2C):   41.3 ml 28.12 ml/m LA Vol (A4C):   22.8 ml 15.52 ml/m LA Biplane Vol: 32.1 ml 21.85 ml/m  AORTIC VALVE AV Area (Vmax):    1.98 cm AV Area (Vmean):   1.96 cm AV Area (VTI):     2.22 cm AV Vmax:           114.00 cm/s AV Vmean:          68.000 cm/s AV VTI:            0.174 m AV Peak Grad:      5.2 mmHg AV Mean Grad:      2.0 mmHg LVOT Vmax:         88.60 cm/s LVOT Vmean:        52.500 cm/s LVOT VTI:          0.152 m LVOT/AV VTI ratio: 0.87  AORTA Ao Asc diam: 2.70 cm MITRAL VALVE               TRICUSPID VALVE MV Area (PHT): 6.17 cm    TR Peak grad:   8.9 mmHg MV Decel Time: 123 msec    TR Vmax:        149.00 cm/s MV E velocity: 55.30 cm/s MV A velocity: 74.60 cm/s  SHUNTS MV E/A ratio:  0.74        Systemic VTI:  0.15 m                            Systemic Diam: 1.80 cm Janelle Mediate MD Electronically signed by Janelle Mediate MD Signature Date/Time: 06/10/2023/12:52:54 PM    Final    CT Soft Tissue Neck W Contrast Result Date: 06/08/2023 CLINICAL DATA:  Non-small-cell lung cancer, neck mass, restaging * Tracking Code: BO * EXAM: CT NECK WITH CONTRAST CT CHEST, ABDOMEN, AND PELVIS WITH CONTRAST TECHNIQUE: Multidetector CT imaging of the neck, chest, abdomen and pelvis was performed following the standard protocol during bolus administration of intravenous contrast. RADIATION DOSE REDUCTION: This exam was performed according to the departmental dose-optimization program which includes automated exposure control, adjustment of the mA and/or kV according to patient size and/or use of iterative reconstruction technique. CONTRAST:  85mL OMNIPAQUE  IOHEXOL  300 MG/ML  SOLN COMPARISON:  04/07/2023 FINDINGS: CT NECK FINDINGS Skull base  and limited brain: No acute fracture. No primary bone lesion or focal pathologic process. Soft tissues and lymph nodes: Continued reduction in size of a left supraclavicular lymph node, now measuring 0.4 cm, previously 0.7 cm (series 3, image 65). No residual or recurrent lymphadenopathy in the neck. Paraspinous soft tissues and spinal canal: No prevertebral fluid or swelling. No  visible canal hematoma. Cervical spine: Degenerative straightening of the normal cervical lordosis. Moderate multilevel cervical disc degenerative disease, worst from C4-C6. Other: None. CT CHEST FINDINGS Cardiovascular: Right chest port catheter. Normal heart size. No pericardial effusion. Mediastinum/Nodes: Unchanged treated soft tissue in the mediastinum and right hilum (series 3, image 72). No residual discretely enlarged lymph nodes. Unchanged circumferential wall thickening of the midesophagus (series 3, image 66). Thyroid  gland and trachea without significant findings. Lungs/Pleura: Severe emphysema. Diffuse bilateral bronchial wall thickening. Unchanged post treatment appearance of the perihilar and suprahilar right lung with bandlike radiation fibrosis and bronchiectasis (series 4, image 57). No pleural effusion or pneumothorax. Musculoskeletal: No chest wall abnormality. No acute osseous findings. CT ABDOMEN PELVIS FINDINGS Hepatobiliary: No focal liver abnormality is seen. Status post cholecystectomy. Unchanged postoperative biliary dilatation. Pancreas: Unchanged prominence of the central pancreatic duct (series 3, image 149). No pancreatic ductal dilatation or surrounding inflammatory changes. Spleen: Normal in size without significant abnormality. Adrenals/Urinary Tract: Adrenal glands are unremarkable. Kidneys are normal, without renal calculi, solid lesion, or hydronephrosis. Bladder is unremarkable. Stomach/Bowel: Stomach is within normal limits. Status post appendectomy. No evidence of bowel wall thickening, distention, or  inflammatory changes. Vascular/Lymphatic: Aortic atherosclerosis. Interval resolution of previously enlarged left retroperitoneal lymph node, now measuring 0.9 x 0.5 cm, previously 1.6 x 1.2 cm (series 3, image 154). Reproductive: Status post hysterectomy. Other: No abdominal wall hernia or abnormality. No ascites. Musculoskeletal: No acute osseous findings. IMPRESSION: 1. Continued reduction in size of a left supraclavicular lymph node. No residual or recurrent lymphadenopathy in the neck. 2. Unchanged treated soft tissue in the mediastinum and right hilum. No residual discretely enlarged lymph nodes. 3. Unchanged post treatment appearance of the perihilar and suprahilar right lung with bandlike radiation fibrosis and bronchiectasis. 4. Interval resolution of previously enlarged left retroperitoneal lymph node; unclear whether this reflects treatment response or resolution of reactive lymphadenopathy. 5. No evidence of new lymphadenopathy or metastatic disease in the chest, abdomen, or pelvis. 6. Severe emphysema. Aortic Atherosclerosis (ICD10-I70.0) and Emphysema (ICD10-J43.9). Electronically Signed   By: Fredricka Jenny M.D.   On: 06/08/2023 07:50   CT CHEST ABDOMEN PELVIS W CONTRAST Result Date: 06/08/2023 CLINICAL DATA:  Non-small-cell lung cancer, neck mass, restaging * Tracking Code: BO * EXAM: CT NECK WITH CONTRAST CT CHEST, ABDOMEN, AND PELVIS WITH CONTRAST TECHNIQUE: Multidetector CT imaging of the neck, chest, abdomen and pelvis was performed following the standard protocol during bolus administration of intravenous contrast. RADIATION DOSE REDUCTION: This exam was performed according to the departmental dose-optimization program which includes automated exposure control, adjustment of the mA and/or kV according to patient size and/or use of iterative reconstruction technique. CONTRAST:  85mL OMNIPAQUE  IOHEXOL  300 MG/ML  SOLN COMPARISON:  04/07/2023 FINDINGS: CT NECK FINDINGS Skull base and limited brain:  No acute fracture. No primary bone lesion or focal pathologic process. Soft tissues and lymph nodes: Continued reduction in size of a left supraclavicular lymph node, now measuring 0.4 cm, previously 0.7 cm (series 3, image 65). No residual or recurrent lymphadenopathy in the neck. Paraspinous soft tissues and spinal canal: No prevertebral fluid or swelling. No visible canal hematoma. Cervical spine: Degenerative straightening of the normal cervical lordosis. Moderate multilevel cervical disc degenerative disease, worst from C4-C6. Other: None. CT CHEST FINDINGS Cardiovascular: Right chest port catheter. Normal heart size. No pericardial effusion. Mediastinum/Nodes: Unchanged treated soft tissue in the mediastinum and right hilum (series 3, image 72). No residual discretely enlarged lymph nodes. Unchanged circumferential wall thickening of the midesophagus (  series 3, image 66). Thyroid  gland and trachea without significant findings. Lungs/Pleura: Severe emphysema. Diffuse bilateral bronchial wall thickening. Unchanged post treatment appearance of the perihilar and suprahilar right lung with bandlike radiation fibrosis and bronchiectasis (series 4, image 57). No pleural effusion or pneumothorax. Musculoskeletal: No chest wall abnormality. No acute osseous findings. CT ABDOMEN PELVIS FINDINGS Hepatobiliary: No focal liver abnormality is seen. Status post cholecystectomy. Unchanged postoperative biliary dilatation. Pancreas: Unchanged prominence of the central pancreatic duct (series 3, image 149). No pancreatic ductal dilatation or surrounding inflammatory changes. Spleen: Normal in size without significant abnormality. Adrenals/Urinary Tract: Adrenal glands are unremarkable. Kidneys are normal, without renal calculi, solid lesion, or hydronephrosis. Bladder is unremarkable. Stomach/Bowel: Stomach is within normal limits. Status post appendectomy. No evidence of bowel wall thickening, distention, or inflammatory  changes. Vascular/Lymphatic: Aortic atherosclerosis. Interval resolution of previously enlarged left retroperitoneal lymph node, now measuring 0.9 x 0.5 cm, previously 1.6 x 1.2 cm (series 3, image 154). Reproductive: Status post hysterectomy. Other: No abdominal wall hernia or abnormality. No ascites. Musculoskeletal: No acute osseous findings. IMPRESSION: 1. Continued reduction in size of a left supraclavicular lymph node. No residual or recurrent lymphadenopathy in the neck. 2. Unchanged treated soft tissue in the mediastinum and right hilum. No residual discretely enlarged lymph nodes. 3. Unchanged post treatment appearance of the perihilar and suprahilar right lung with bandlike radiation fibrosis and bronchiectasis. 4. Interval resolution of previously enlarged left retroperitoneal lymph node; unclear whether this reflects treatment response or resolution of reactive lymphadenopathy. 5. No evidence of new lymphadenopathy or metastatic disease in the chest, abdomen, or pelvis. 6. Severe emphysema. Aortic Atherosclerosis (ICD10-I70.0) and Emphysema (ICD10-J43.9). Electronically Signed   By: Fredricka Jenny M.D.   On: 06/08/2023 07:50       The results of significant diagnostics from this hospitalization (including imaging, microbiology, ancillary and laboratory) are listed below for reference.     Microbiology: Recent Results (from the past 240 hours)  Culture, blood (x 2)     Status: None (Preliminary result)   Collection Time: 06/23/23  9:54 PM   Specimen: BLOOD RIGHT HAND  Result Value Ref Range Status   Specimen Description   Final    BLOOD RIGHT HAND Performed at [facility], [address], [place], [place] [address]    Special Requests   Final    BOTTLES DRAWN AEROBIC ONLY Blood Culture results may not be optimal due to an inadequate volume of blood received in culture bottles Performed at [facility], [address], [place], [place] [address]    Culture    Final    NO GROWTH 2 DAYS Performed at [facility], [address], [place], [place] [address]    Report Status PENDING  Incomplete  Culture, blood (x 2)     Status: None (Preliminary result)   Collection Time: 06/23/23 10:03 PM   Specimen: BLOOD LEFT ARM  Result Value Ref Range Status   Specimen Description   Final    BLOOD LEFT ARM Performed at [facility], [address], [place], [place] [address]    Special Requests   Final    BOTTLES DRAWN AEROBIC ONLY Blood Culture results may not be optimal due to an inadequate volume of blood received in culture bottles Performed at [facility], [address], [place], [place] [address]    Culture   Final    NO GROWTH 2 DAYS Performed at [facility] [facility], [address] [place], [place] [address]    Report Status PENDING  Incomplete     Labs:  CBC: Recent Labs  Lab 06/23/23 1108 06/23/23 2203 06/24/23 0428 06/25/23 0540  WBC 1.0* 1.6* 2.0* 1.9*  NEUTROABS 0.4*  --   --   --   HGB 8.1* 7.3* 7.3* 7.0*  HCT 24.0* 21.7* 22.2* 21.3*  MCV 103.0* 104.3* 106.2* 105.4*  PLT 112* 89* 80* 69*   BMP &GFR Recent Labs  Lab 06/23/23 1108 06/23/23 2203 06/24/23 0428 06/25/23 0540  NA 139 135 137 136  K 3.8 3.7 3.6 3.5  CL 101 101 102 102  CO2 27 27 28 28   GLUCOSE 149* 122* 99 88  BUN 11 10 10 9   CREATININE 1.00 0.85 0.90 0.91  CALCIUM  9.5 8.4* 8.6* 8.3*   Estimated Creatinine Clearance: 49.5 mL/min (by C-G formula based on SCr of 0.91 mg/dL). Liver & Pancreas: Recent Labs  Lab 06/23/23 1108 06/23/23 2203 06/25/23 0540  AST 38 112* 43*  ALT 37 94* 55*  ALKPHOS 168* 243* 176*  BILITOT 0.5 0.8 0.7  PROT 6.9 6.3* 5.9*  ALBUMIN 3.6 2.8* 2.5*   Recent Labs  Lab 06/23/23 1108  LIPASE 33   No results for input(s): "AMMONIA" in the last 168 hours. Diabetic: Recent Labs    06/23/23 1732  HGBA1C 5.0   Recent Labs  Lab 06/24/23 0731  GLUCAP 103*   Cardiac Enzymes: No results  for input(s): "CKTOTAL", "CKMB", "CKMBINDEX", "TROPONINI" in the last 168 hours. No results for input(s): "PROBNP" in the last 8760 hours. Coagulation Profile: No results for input(s): "INR", "PROTIME" in the last 168 hours. Thyroid  Function Tests: No results for input(s): "TSH", "T4TOTAL", "FREET4", "T3FREE", "THYROIDAB" in the last 72 hours. Lipid Profile: No results for input(s): "CHOL", "HDL", "LDLCALC", "TRIG", "CHOLHDL", "LDLDIRECT" in the last 72 hours. Anemia Panel: No results for input(s): "VITAMINB12", "FOLATE", "FERRITIN", "TIBC", "IRON", "RETICCTPCT" in the last 72 hours. Urine analysis:    Component Value Date/Time   COLORURINE YELLOW 06/23/2023 1111   APPEARANCEUR CLEAR 06/23/2023 1111   LABSPEC 1.015 06/23/2023 1111   PHURINE 7.0 06/23/2023 1111   GLUCOSEU NEGATIVE 06/23/2023 1111   GLUCOSEU NEGATIVE 03/01/2013 0733   HGBUR NEGATIVE 06/23/2023 1111   BILIRUBINUR NEGATIVE 06/23/2023 1111   KETONESUR NEGATIVE 06/23/2023 1111   PROTEINUR NEGATIVE 06/23/2023 1111   UROBILINOGEN 0.2 03/01/2013 0733   NITRITE NEGATIVE 06/23/2023 1111   LEUKOCYTESUR NEGATIVE 06/23/2023 1111   Sepsis Labs: Invalid input(s): "PROCALCITONIN", "LACTICIDVEN"   SIGNED:  Theadore Finger, MD  [facility] 06/25/2023, 5:50 PM

## 2023-06-27 ENCOUNTER — Other Ambulatory Visit: Payer: Self-pay

## 2023-06-28 LAB — CULTURE, BLOOD (ROUTINE X 2)
Culture: NO GROWTH
Culture: NO GROWTH

## 2023-06-30 ENCOUNTER — Other Ambulatory Visit (HOSPITAL_BASED_OUTPATIENT_CLINIC_OR_DEPARTMENT_OTHER)

## 2023-07-06 ENCOUNTER — Encounter: Payer: Self-pay | Admitting: Internal Medicine

## 2023-07-06 ENCOUNTER — Inpatient Hospital Stay: Payer: Commercial Managed Care - PPO

## 2023-07-06 ENCOUNTER — Inpatient Hospital Stay: Payer: Commercial Managed Care - PPO | Attending: Internal Medicine | Admitting: Internal Medicine

## 2023-07-06 ENCOUNTER — Encounter: Payer: Self-pay | Admitting: Medical Oncology

## 2023-07-06 VITALS — BP 107/61 | HR 109 | Temp 98.0°F | Resp 18 | Ht 62.0 in | Wt 105.5 lb

## 2023-07-06 DIAGNOSIS — K5900 Constipation, unspecified: Secondary | ICD-10-CM | POA: Diagnosis not present

## 2023-07-06 DIAGNOSIS — C3411 Malignant neoplasm of upper lobe, right bronchus or lung: Secondary | ICD-10-CM | POA: Insufficient documentation

## 2023-07-06 DIAGNOSIS — D649 Anemia, unspecified: Secondary | ICD-10-CM | POA: Diagnosis not present

## 2023-07-06 DIAGNOSIS — C3491 Malignant neoplasm of unspecified part of right bronchus or lung: Secondary | ICD-10-CM

## 2023-07-06 DIAGNOSIS — D701 Agranulocytosis secondary to cancer chemotherapy: Secondary | ICD-10-CM

## 2023-07-06 DIAGNOSIS — D72819 Decreased white blood cell count, unspecified: Secondary | ICD-10-CM | POA: Insufficient documentation

## 2023-07-06 DIAGNOSIS — Z5111 Encounter for antineoplastic chemotherapy: Secondary | ICD-10-CM | POA: Diagnosis present

## 2023-07-06 DIAGNOSIS — Z5112 Encounter for antineoplastic immunotherapy: Secondary | ICD-10-CM | POA: Insufficient documentation

## 2023-07-06 DIAGNOSIS — Z95828 Presence of other vascular implants and grafts: Secondary | ICD-10-CM

## 2023-07-06 LAB — CBC WITH DIFFERENTIAL (CANCER CENTER ONLY)
Abs Immature Granulocytes: 0 10*3/uL (ref 0.00–0.07)
Basophils Absolute: 0 10*3/uL (ref 0.0–0.1)
Basophils Relative: 0 %
Eosinophils Absolute: 0.1 10*3/uL (ref 0.0–0.5)
Eosinophils Relative: 3 %
HCT: 26.2 % — ABNORMAL LOW (ref 36.0–46.0)
Hemoglobin: 8.7 g/dL — ABNORMAL LOW (ref 12.0–15.0)
Immature Granulocytes: 0 %
Lymphocytes Relative: 18 %
Lymphs Abs: 0.5 10*3/uL — ABNORMAL LOW (ref 0.7–4.0)
MCH: 35.5 pg — ABNORMAL HIGH (ref 26.0–34.0)
MCHC: 33.2 g/dL (ref 30.0–36.0)
MCV: 106.9 fL — ABNORMAL HIGH (ref 80.0–100.0)
Monocytes Absolute: 0.5 10*3/uL (ref 0.1–1.0)
Monocytes Relative: 21 %
Neutro Abs: 1.5 10*3/uL — ABNORMAL LOW (ref 1.7–7.7)
Neutrophils Relative %: 58 %
Platelet Count: 177 10*3/uL (ref 150–400)
RBC: 2.45 MIL/uL — ABNORMAL LOW (ref 3.87–5.11)
RDW: 19.6 % — ABNORMAL HIGH (ref 11.5–15.5)
WBC Count: 2.6 10*3/uL — ABNORMAL LOW (ref 4.0–10.5)
nRBC: 0 % (ref 0.0–0.2)

## 2023-07-06 LAB — CMP (CANCER CENTER ONLY)
ALT: 21 U/L (ref 0–44)
AST: 29 U/L (ref 15–41)
Albumin: 3.4 g/dL — ABNORMAL LOW (ref 3.5–5.0)
Alkaline Phosphatase: 141 U/L — ABNORMAL HIGH (ref 38–126)
Anion gap: 7 (ref 5–15)
BUN: 9 mg/dL (ref 6–20)
CO2: 28 mmol/L (ref 22–32)
Calcium: 9.1 mg/dL (ref 8.9–10.3)
Chloride: 106 mmol/L (ref 98–111)
Creatinine: 1.03 mg/dL — ABNORMAL HIGH (ref 0.44–1.00)
GFR, Estimated: >60 mL/min (ref >60)
Glucose, Bld: 145 mg/dL — ABNORMAL HIGH (ref 70–99)
Potassium: 3.9 mmol/L (ref 3.5–5.1)
Sodium: 141 mmol/L (ref 135–145)
Total Bilirubin: 0.6 mg/dL (ref 0.0–1.2)
Total Protein: 6.8 g/dL (ref 6.5–8.1)

## 2023-07-06 MED ORDER — SODIUM CHLORIDE 0.9% FLUSH
10.0000 mL | Freq: Once | INTRAVENOUS | Status: AC
  Administered 2023-07-06: 10 mL

## 2023-07-06 NOTE — Progress Notes (Signed)
 [facility] [phone]   [phone]  OFFICE PROGRESS NOTE  Marlene Simas, MD [address] [place] [address]  DIAGNOSIS: Recurrent/metastatic non-small cell lung cancer initially diagnosed as stage IIIB  (T1b, N3, M0) non-small cell lung cancer, adenocarcinoma she presented with right upper lobe nodule in addition to bulky right hilar, mediastinal, and left supraclavicular lymphadenopathy diagnosed in November 2022.  The patient had evidence for disease recurrence in the mediastinal and right supraclavicular lymphadenopathy in August 2023.  The patient had evidence of metastatic disease in February 2024 with several supraclavicular, thoracic and precarinal lymphadenopathy as well as upper abdominal lymph nodes and small liver lesion.  DETECTED ALTERATION(S) / BIOMARKER(S) % CFDNA OR AMPLIFICATION ASSOCIATED FDA-APPROVED THERAPIES CLINICAL TRIAL AVAILABILITY [id number] ND 0.5 5 50 100 4.7%  RHOAG17E ND 0.5 5 50 100 1.8%  [id number] ND 0.5 5 50 100 1.9%  BIOMARKER ADDITIONAL DETAILS Tumor Mutational Burden (TMB) 19.02 mut/Mb MSI Status Stable (MSS) PD-L1 Tumor Proportion Score (TPS)* <1%  Biomarker Findings By [facility] on 05/06/2022 Tumor Mutational Burden - 17 Muts/Mb Microsatellite status - MS-Stable Genomic Findings For a complete list of the genes assayed, please refer to the Appendix. CTNNB1 S37C CRKL amplification MAP2K4 loss exons 2-11 TP53 V143A 8 Disease relevant genes with no reportable alterations: ALK, BRAF, EGFR, ERBB2, KRAS, MET, RET, ROS1  PDL1 0%   PRIOR THERAPY:  1) Concurrent chemoradiation with carboplatin  for an AUC of 2 and paclitaxel  45 mg per metered squared.  First dose on 01/05/2021.  Status post 7 cycles of treatment.  Last dose was given February 16, 2021. 2) Consolidation immunotherapy with Imfinzi  1500 Mg IV every 4 weeks.  First dose March 25, 2021.  Status post 3 cycles.  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 3) Palliative radiotherapy to the enlarging right supraclavicular lymphadenopathy under the care of Dr. Eloise Hake expected to be completed on October 28, 2021.   CURRENT THERAPY: Palliative systemic chemotherapy with carboplatin  for AUC of 5, Alimta 500 Mg/M2 and Keytruda  200 Mg IV every 3 weeks.  First dose April 05, 2022.  Status post 21 cycles.  Starting from cycle #3 her dose of carboplatin  was reduced to AUC of 4 and Alimta 400 Mg/M2.  Starting from cycle #5 the patient will be on maintenance treatment with Alimta and Keytruda  every 3 weeks.  INTERVAL HISTORY: [name] 58 y.o. female returns to the clinic today for follow-up visit. Discussed the use of AI scribe software for clinical note transcription with the patient, who gave verbal consent to proceed.  History of Present Illness   Julie Nicholson "Julie Nicholson" is a 58 year old female who presents for evaluation before starting cycle number twenty two of maintenance treatment with Alimta and Keytruda .  She has been on maintenance treatment with Alimta and Keytruda  every three weeks.  Approximately two weeks ago, she experienced abdominal pain and nausea, which led to vomiting. A CT scan at the hospital revealed constipation. She is taking two stool softeners, Miralax , and fiber pills, which she continues to use. She now reports regular bowel movements.  No diarrhea. Her hemoglobin had previously dropped to 7 during her hospital visit. She feels tired but otherwise okay.       MEDICAL HISTORY: Past Medical History:  Diagnosis Date   Abdominal discomfort    Cancer (HCC)    Chronic headaches    due to allergies, sinus   COPD (chronic obstructive pulmonary  disease) (HCC)    per 2012 chest xray   pt states she doesn not have this now (04/10/2013)   Deviated nasal septum    Eustachian tube dysfunction    GERD (gastroesophageal reflux disease)    occasional  uses Tums / Rolaids   Hearing loss    right ear   High cholesterol    History of radiation therapy    right lung 01/07/2021-02/19/2021  Dr Retta Caster   History of radiation therapy    Lymph nodes of head,face,neck- 12/23/22-01/07/23- Dr. Retta Caster   Migraine    "only once in a blue moon since RX'd allergy  shots" (04/10/2013)   Pancreatitis 02/08/2013   Pneumonia    Rhinitis, allergic     ALLERGIES:  is allergic to carboplatin .  MEDICATIONS:  Current Outpatient Medications  Medication Sig Dispense Refill   acetaminophen  (TYLENOL ) 500 MG tablet Take 1,000 mg by mouth every 6 (six) hours as needed for moderate pain.     albuterol  (PROVENTIL ) (2.5 MG/3ML) 0.083% nebulizer solution albuterol  sulfate 2.5 mg/3 mL (0.083 %) solution for nebulization  USE 1 VIAL IN NEBULIZER EVERY 6 HOURS AS NEEDED FOR WHEEZING OR SHORTNESS OF BREATH     albuterol  (VENTOLIN  HFA) 108 (90 Base) MCG/ACT inhaler Inhale 1-2 puffs into the lungs every 6 (six) hours as needed for wheezing or shortness of breath. 18 g 3   aspirin  EC 81 MG tablet Take 81 mg by mouth at bedtime.     atorvastatin  (LIPITOR) 40 MG tablet Take 40 mg by mouth at bedtime.     azelastine  (ASTELIN ) 0.1 % nasal spray Place 1 spray into both nostrils 2 (two) times daily as needed. Use in each nostril as directed 30 mL 5   benzonatate  (TESSALON ) 200 MG capsule Take 1 capsule (200 mg total) by mouth 3 (three) times daily as needed. 30 capsule 0   Biotin 5000 MCG TABS Take 5,000 mcg by mouth at bedtime.     buPROPion  (WELLBUTRIN  XL) 150 MG 24 hr tablet Take 150 mg by mouth at bedtime.     Ca Carbonate-Mag Hydroxide (ROLAIDS PO) Take 1 tablet by mouth daily as needed (heartburn).     famotidine  (PEPCID ) 20 MG tablet Take 1 tablet by mouth twice daily 60 tablet 0   fluticasone  (FLONASE ) 50 MCG/ACT nasal spray Place 2 sprays into both nostrils daily. 16 g 5   folic acid  (FOLVITE ) 1 MG tablet Take 1 tablet by mouth once daily 30 tablet 0    furosemide (LASIX) 20 MG tablet Take 20 mg by mouth daily.     lidocaine -prilocaine  (EMLA ) cream APPLY TO PORT-A-CATH 30 TO 60 MINUTES BEFORE TREATMENT 30 g 0   linaCLOtide (LINZESS PO) Take by mouth.     loratadine  (CLARITIN ) 10 MG tablet Take 10 mg by mouth daily.     Multiple Vitamins-Minerals (HAIR SKIN AND NAILS FORMULA PO) Take 1 tablet by mouth daily.     ondansetron  (ZOFRAN -ODT) 8 MG disintegrating tablet DISSOLVE 1 TABLET IN MOUTH THREE TIMES DAILY AS NEEDED 20 tablet 0   Oxycodone  HCl 10 MG TABS Take 1 tablet by mouth 3 (three) times daily as needed (pain).     polyethylene glycol powder (MIRALAX ) 17 GM/SCOOP powder Take 17 g by mouth 2 (two) times daily as needed for mild constipation. 255 g 1   prochlorperazine  (COMPAZINE ) 10 MG tablet Take 1 tablet (10 mg total) by mouth every 6 (six) hours as needed. 30 tablet 2   rizatriptan (MAXALT-MLT) 10 MG  disintegrating tablet Take 10 mg by mouth as needed for migraine. May repeat in 2 hours if needed     senna-docusate (SENOKOT-S) 8.6-50 MG tablet Take 2 tablets by mouth 2 (two) times daily between meals as needed for moderate constipation.     sertraline  (ZOLOFT ) 50 MG tablet Take 1 tablet by mouth daily.     Tiotropium Bromide-Olodaterol (STIOLTO RESPIMAT ) 2.5-2.5 MCG/ACT AERS Inhale 2 puffs into the lungs daily. 12 g 3   No current facility-administered medications for this visit.    SURGICAL HISTORY:  Past Surgical History:  Procedure Laterality Date   ABDOMINAL HYSTERECTOMY  1995   tx endometriosis, both ovaries removed   ADENOIDECTOMY     APPENDECTOMY  late 1990's   BIOPSY  02/26/2021   Procedure: BIOPSY;  Surgeon: Janel Medford, MD;  Location: [facility] END[facility]COPY;  Service: Endoscopy;;   BRONCHIAL BRUSHINGS  12/15/2020   Procedure: BRONCHIAL BRUSHINGS;  Surgeon: Denson Flake, MD;  Location: [facility] ENDOSCOPY;  Service: Cardiopulmonary;;   BRONCHIAL NEEDLE ASPIRATION BIOPSY  12/15/2020   Procedure: BRONCHIAL NEEDLE ASPIRATION  BIOPSIES;  Surgeon: Denson Flake, MD;  Location: [facility] ENDOSCOPY;  Service: Cardiopulmonary;;   CHOLECYSTECTOMY  04/10/2013   CHOLECYSTECTOMY N/A 04/10/2013   Procedure: LAPAROSCOPIC CHOLECYSTECTOMY WITH INTRAOPERATIVE CHOLANGIOGRAM;  Surgeon: Harlee Lichtenstein, MD;  Location: [facility] OR;  Service: General;  Laterality: N/A;   ELECTROMAGNETIC NAVIGATION BROCHOSCOPY  12/15/2020   Procedure: ELECTROMAGNETIC NAVIGATION BRONCHOSCOPY;  Surgeon: Denson Flake, MD;  Location: [facility] ENDOSCOPY;  Service: Cardiopulmonary;;   ESOPHAGOGASTRODUODENOSCOPY (EGD) WITH PROPOFOL  N/A 02/26/2021   Procedure: ESOPHAGOGASTRODUODENOSCOPY (EGD) WITH PROPOFOL ;  Surgeon: Janel Medford, MD;  Location: [facility] ENDOSCOPY;  Service: Endoscopy;  Laterality: N/A;   EUS N/A 02/16/2013   Procedure: UPPER ENDOSCOPIC ULTRASOUND (EUS) LINEAR;  Surgeon: Janel Medford, MD;  Location: [facility];  Service: Endoscopy;  Laterality: N/A;  radial linear   IR IMAGING GUIDED PORT INSERTION  06/30/2022   KNEE ARTHROSCOPY Right 1980's   "cartilage OR"   LAPAROSCOPIC ENDOMETRIOSIS FULGURATION  1980's   MYRINGOTOMY WITH TUBE PLACEMENT Right 07/13/2018   Procedure: MYRINGOTOMY WITH TUBE PLACEMENT;  Surgeon: Mellody Sprout, MD;  Location: [facility];  Service: ENT;  Laterality: Right;   MYRINGOTOMY WITH TUBE PLACEMENT Right 06/04/2021   Procedure: MYRINGOTOMY WITH BUTTERFLY TUBE PLACEMENT;  Surgeon: Mellody Sprout, MD;  Location: [facility];  Service: ENT;  Laterality: Right;   NASOPHARYNGOSCOPY EUSTATION TUBE BALLOON DILATION Right 07/13/2018   Procedure: NASOPHARYNGOSCOPY EUSTATION TUBE BALLOON DILATION;  Surgeon: Mellody Sprout, MD;  Location: [facility];  Service: ENT;  Laterality: Right;   TONSILLECTOMY AND ADENOIDECTOMY  ~ 1980   adenoidectomy   TUBAL LIGATION  ~ 1987   TURBINATE REDUCTION Right 07/13/2018   Procedure: OUTFRACTURE TURBINATE;  Surgeon: Mellody Sprout, MD;  Location: [facility];  Service: ENT;   Laterality: Right;   TYMPANOSTOMY TUBE PLACEMENT     VIDEO BRONCHOSCOPY WITH ENDOBRONCHIAL ULTRASOUND N/A 12/15/2020   Procedure: ROBOTIC VIDEO BRONCHOSCOPY WITH ENDOBRONCHIAL ULTRASOUND;  Surgeon: Denson Flake, MD;  Location: [facility] ENDOSCOPY;  Service: Cardiopulmonary;  Laterality: N/A;   WRIST SURGERY Left    w/plate    REVIEW OF SYSTEMS:  Constitutional: positive for fatigue Eyes: negative Ears, nose, mouth, throat, and face: negative Respiratory: negative Cardiovascular: negative Gastrointestinal: negative Genitourinary:negative Integument/breast: negative Hematologic/lymphatic: negative Musculoskeletal:negative Neurological: negative Behavioral/Psych: negative Endocrine: negative Allergic/Immunologic: negative   PHYSICAL EXAMINATION: General appearance: alert, cooperative, and no distress Head: Normocephalic, without obvious abnormality, atraumatic Neck: moderate  anterior cervical adenopathy, no JVD, supple, symmetrical, trachea midline, and thyroid  not enlarged, symmetric, no tenderness/mass/nodules Lymph nodes: Cervical, supraclavicular, and axillary nodes normal. Resp: clear to auscultation bilaterally Back: symmetric, no curvature. ROM normal. No CVA tenderness. Cardio: regular rate and rhythm, S1, S2 normal, no murmur, click, rub or gallop GI: soft, non-tender; bowel sounds normal; no masses,  no organomegaly Extremities: extremities normal, atraumatic, no cyanosis or edema Neurologic: Alert and oriented X 3, normal strength and tone. Normal symmetric reflexes. Normal coordination and gait  ECOG PERFORMANCE STATUS: 1 - Symptomatic but completely ambulatory  Blood pressure 107/61, pulse (!) 109, temperature 98 F (36.7 C), temperature source Tympanic, resp. rate 18, height 5\' 2"  (1.575 m), weight 105 lb 8 oz (47.9 kg), SpO2 100%.  LABORATORY DATA: Lab Results  Component Value Date   WBC 2.6 (L) 07/06/2023   HGB 8.7 (L) 07/06/2023   HCT 26.2 (L) 07/06/2023   MCV  106.9 (H) 07/06/2023   PLT 177 07/06/2023      Chemistry      Component Value Date/Time   NA 136 06/25/2023 0540   K 3.5 06/25/2023 0540   CL 102 06/25/2023 0540   CO2 28 06/25/2023 0540   BUN 9 06/25/2023 0540   CREATININE 0.91 06/25/2023 0540   CREATININE 0.96 06/15/2023 1029      Component Value Date/Time   CALCIUM  8.3 (L) 06/25/2023 0540   ALKPHOS 176 (H) 06/25/2023 0540   AST 43 (H) 06/25/2023 0540   AST 22 06/15/2023 1029   ALT 55 (H) 06/25/2023 0540   ALT 24 06/15/2023 1029   BILITOT 0.7 06/25/2023 0540   BILITOT 0.5 06/15/2023 1029       RADIOGRAPHIC STUDIES: DG Abd Portable 1V Result Date: 06/24/2023 CLINICAL DATA:  Constipation for 2 days, abdominal distension, pain EXAM: PORTABLE ABDOMEN - 1 VIEW COMPARISON:  06/23/2023 FINDINGS: Two supine frontal views of the abdomen and pelvis are obtained. There is residual gas and stool throughout the colon, with mild decrease in stool burden since prior study. No evidence of small-bowel obstruction or ileus. There are no masses or abnormal calcifications. Lung bases are clear. No acute bony abnormalities. IMPRESSION: 1. Moderate gas and stool throughout the colon, with slight decrease in stool burden since prior study. Electronically Signed   By: Bobbye Burrow M.D.   On: 06/24/2023 15:04   CT ABDOMEN PELVIS W CONTRAST Result Date: 06/23/2023 CLINICAL DATA:  Left lower quadrant abdominal pain. Non-small cell lung cancer. Metastatic disease evaluation. EXAM: CT ABDOMEN AND PELVIS WITH CONTRAST TECHNIQUE: Multidetector CT imaging of the abdomen and pelvis was performed using the standard protocol following bolus administration of intravenous contrast. RADIATION DOSE REDUCTION: This exam was performed according to the departmental dose-optimization program which includes automated exposure control, adjustment of the mA and/or kV according to patient size and/or use of iterative reconstruction technique. CONTRAST:  100mL OMNIPAQUE   IOHEXOL  300 MG/ML  SOLN COMPARISON:  CT of the chest abdomen pelvis dated 06/01/2023. FINDINGS: Lower chest: The visualized lung bases are clear. No intra-abdominal free air or free fluid. Hepatobiliary: The liver is unremarkable. There is mild biliary dilatation, post cholecystectomy. No retained calcified stone noted in the central CBD. Pancreas: Unremarkable. No pancreatic ductal dilatation or surrounding inflammatory changes. Spleen: Normal in size without focal abnormality. Adrenals/Urinary Tract: The adrenal glands are unremarkable. There is no hydronephrosis on either side. There is symmetric enhancement and excretion of contrast by both kidneys. The visualized ureters and urinary bladder appear unremarkable Stomach/Bowel: There is  moderate stool in the distal colon. Mild pericolonic haziness along the descending and sigmoid colon may represent mild stercoral colitis. Clinical correlation is recommended. No bowel obstruction. Appendectomy. Vascular/Lymphatic: Mild aortoiliac atherosclerotic disease. The IVC is unremarkable. No portal venous gas. There is no adenopathy. Reproductive: Hysterectomy.  No suspicious adnexal masses. Other: None Musculoskeletal: No acute or significant osseous findings. IMPRESSION: 1. Moderate stool in the distal colon with findings of possible mild stercoral colitis. Clinical correlation is recommended. No bowel obstruction. 2. No evidence of metastatic disease in the abdomen or pelvis. 3.  Aortic Atherosclerosis (ICD10-I70.0). Electronically Signed   By: Angus Bark M.D.   On: 06/23/2023 12:36   ECHOCARDIOGRAM COMPLETE Result Date: 06/10/2023    ECHOCARDIOGRAM REPORT   Patient Name:   Julie Nicholson Date of Exam: 06/10/2023 Medical Rec #:  [id number]        Height:       62.0 in Accession #:    [id number]       Weight:       107.6 lb Date of Birth:  May 12, 1965        BSA:          1.469 m Patient Age:    57 years         BP:           112/72 mmHg Patient Gender: F                 HR:           95 bpm. Exam Location:  Outpatient Procedure: 2D Echo, Cardiac Doppler and Color Doppler (Both Spectral and Color            Flow Doppler were utilized during procedure). Indications:    I50.21 Acute systolic (congestive) heart failure  History:        Patient has no prior history of Echocardiogram examinations.                 COPD.  Sonographer:    Andrena Bang Referring Phys: [id number] ROBERT S [name] IMPRESSIONS  1. Left ventricular ejection fraction, by estimation, is 40 to 45%. The left ventricle has mildly decreased function. The left ventricle demonstrates global hypokinesis. The left ventricular internal cavity size was mildly dilated. Left ventricular diastolic parameters are consistent with Grade I diastolic dysfunction (impaired relaxation).  2. Right ventricular systolic function is normal. The right ventricular size is normal.  3. The mitral valve is abnormal. Trivial mitral valve regurgitation. No evidence of mitral stenosis.  4. The aortic valve is tricuspid. There is mild calcification of the aortic valve. Aortic valve regurgitation is mild. Aortic valve sclerosis is present, with no evidence of aortic valve stenosis.  5. The inferior vena cava is normal in size with greater than 50% respiratory variability, suggesting right atrial pressure of 3 mmHg. FINDINGS  Left Ventricle: Left ventricular ejection fraction, by estimation, is 40 to 45%. The left ventricle has mildly decreased function. The left ventricle demonstrates global hypokinesis. Strain was performed and the global longitudinal strain is indeterminate. The left ventricular internal cavity size was mildly dilated. There is no left ventricular hypertrophy. Left ventricular diastolic parameters are consistent with Grade I diastolic dysfunction (impaired relaxation). Right Ventricle: The right ventricular size is normal. No increase in right ventricular wall thickness. Right ventricular systolic function is normal. Left Atrium: Left  atrial size was normal in size. Right Atrium: Right atrial size was normal in size. Pericardium: There is no evidence of pericardial effusion.  Mitral Valve: The mitral valve is abnormal. There is mild thickening of the mitral valve leaflet(s). Trivial mitral valve regurgitation. No evidence of mitral valve stenosis. Tricuspid Valve: The tricuspid valve is normal in structure. Tricuspid valve regurgitation is mild . No evidence of tricuspid stenosis. Aortic Valve: The aortic valve is tricuspid. There is mild calcification of the aortic valve. Aortic valve regurgitation is mild. Aortic valve sclerosis is present, with no evidence of aortic valve stenosis. Aortic valve mean gradient measures 2.0 mmHg. Aortic valve peak gradient measures 5.2 mmHg. Aortic valve area, by VTI measures 2.22 cm. Pulmonic Valve: The pulmonic valve was normal in structure. Pulmonic valve regurgitation is trivial. No evidence of pulmonic stenosis. Aorta: The aortic root is normal in size and structure. Venous: The inferior vena cava is normal in size with greater than 50% respiratory variability, suggesting right atrial pressure of 3 mmHg. IAS/Shunts: No atrial level shunt detected by color flow Doppler. Additional Comments: 3D was performed not requiring image post processing on an independent workstation and was indeterminate.  LEFT VENTRICLE PLAX 2D LVIDd:         4.30 cm     Diastology LVIDs:         3.60 cm     LV e' medial:    6.74 cm/s LV PW:         0.80 cm     LV E/e' medial:  8.2 LV IVS:        0.80 cm     LV e' lateral:   7.51 cm/s LVOT diam:     1.80 cm     LV E/e' lateral: 7.4 LV SV:         39 LV SV Index:   26 LVOT Area:     2.54 cm  LV Volumes (MOD) LV vol d, MOD A2C: 58.4 ml LV vol d, MOD A4C: 64.3 ml LV vol s, MOD A2C: 33.7 ml LV vol s, MOD A4C: 38.4 ml LV SV MOD A2C:     24.7 ml LV SV MOD A4C:     64.3 ml LV SV MOD BP:      25.7 ml RIGHT VENTRICLE RV S prime:     9.57 cm/s TAPSE (M-mode): 1.3 cm LEFT ATRIUM              Index LA diam:        2.40 cm 1.63 cm/m LA Vol (A2C):   41.3 ml 28.12 ml/m LA Vol (A4C):   22.8 ml 15.52 ml/m LA Biplane Vol: 32.1 ml 21.85 ml/m  AORTIC VALVE AV Area (Vmax):    1.98 cm AV Area (Vmean):   1.96 cm AV Area (VTI):     2.22 cm AV Vmax:           114.00 cm/s AV Vmean:          68.000 cm/s AV VTI:            0.174 m AV Peak Grad:      5.2 mmHg AV Mean Grad:      2.0 mmHg LVOT Vmax:         88.60 cm/s LVOT Vmean:        52.500 cm/s LVOT VTI:          0.152 m LVOT/AV VTI ratio: 0.87  AORTA Ao Asc diam: 2.70 cm MITRAL VALVE               TRICUSPID VALVE MV Area (PHT): 6.17 cm  TR Peak grad:   8.9 mmHg MV Decel Time: 123 msec    TR Vmax:        149.00 cm/s MV E velocity: 55.30 cm/s MV A velocity: 74.60 cm/s  SHUNTS MV E/A ratio:  0.74        Systemic VTI:  0.15 m                            Systemic Diam: 1.80 cm Janelle Mediate MD Electronically signed by Janelle Mediate MD Signature Date/Time: 06/10/2023/12:52:54 PM    Final      ASSESSMENT AND PLAN: This is a very pleasant 58 years old white female with stage IIIB (T1b, N3, M0) non-small cell lung cancer, adenocarcinoma diagnosed in November 2022 with no actionable mutation and negative PD-L1 expression. The patient completed a course of concurrent chemoradiation with weekly carboplatin  for AUC of 2 and paclitaxel  45 Mg/M2 status post 7 cycles.  She has been tolerating her treatment well except for the mild odynophagia and skin burns. She was also recently admitted to the hospital complaining of dysphagia and odynophagia secondary to radiation induced esophagitis.  She is feeling much better but continues to have residual dysphagia.  She is followed by gastroenterology and was seen by Dr. Howard Macho during her hospitalization. Her scan showed improvement of her disease. I recommended for the patient treatment with consolidation immunotherapy with Imfinzi  1500 Mg IV every 4 weeks.  Status post 3 cycles.  Last dose was given in April 2023.  Her  treatment was discontinued secondary to suspicious immunotherapy mediated pneumonitis with significant shortness of breath at that time and she was treated with a tapered dose of prednisone . Unfortunately her scan showed evidence for disease recurrence with enlargement of lower right cervical lymph nodes as well as mediastinal lymphadenopathy.  She has palpable right cervical lymphadenopathy. 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 The patient underwent ultrasound-guided core biopsy of the right supraclavicular lymph nodes but the final pathology showed necrotic tumor cells with complete coagulative necrosis with focal fibrous tissue and histiocytic reaction.  She also had MRI of the brain that showed no evidence of metastatic disease to the brain. The patient was seen by Dr. Eloise Hake and started palliative radiotherapy to the enlarging right supraclavicular lymphadenopathy.  This was completed on October 28, 2021. The patient was found to have metastatic disease in February 2024 with several lymphadenopathy in the chest as well as supraclavicular, upper abdomen as well as suspicious small liver metastasis. 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  I will [facility] the tissue [facility] to [facility] for molecular studies. She is currently undergoing systemic chemotherapy with carboplatin  for AUC of 5, Alimta 500 Mg/M2 and Keytruda  200 Mg IV every 3 weeks status post 21 cycles.  Starting from cycle #3, I  reduced her dose of carboplatin  to AUC of 4 and Alimta 400 Mg/M2.  She has been tolerating her treatment fairly well. The patient was admitted to the hospital recently with nausea vomiting and constipation. She is still recovering from her recent hospitalization.  Assessment and Plan    Malignant neoplasm of lung Undergoing maintenance treatment with Alimta and Keytruda  every three weeks. Currently post twenty-one cycles and preparing for cycle twenty-two. Recent hospitalization for constipation. Lab results show acceptable white blood count for treatment, but hemoglobin is low at 8.7, improved from 7 during hospitalization. Decision made to delay treatment by one week to allow for recovery due to recent hospitalization and low hemoglobin. - Delay treatment by one week to allow for recovery - Resume treatment next week - Schedule lab and treatment for next week - Follow-up appointment in four weeks  Anemia Hemoglobin level is 8.7, improved from 7 during recent hospitalization. Anemia likely related to ongoing cancer treatment.  Leukopenia White blood count is 2.6, which is low but acceptable for treatment continuation.  Constipation Recent episode of constipation leading to hospitalization. Resolved with increased fiber intake and over-the-counter medication. Currently having regular bowel movements.   She was advised to call immediately if she has any other concerning symptoms in the interval. The patient voices understanding of current disease status and treatment options and is in agreement with the current care plan.  All questions were answered. The patient knows to call the clinic with any problems, questions or concerns. We can certainly see the patient much sooner if necessary.  The total time spent in the appointment was 30 minutes.  Disclaimer: This note was dictated with voice recognition software. Similar sounding words can inadvertently be transcribed and may not be corrected  upon review.

## 2023-07-11 ENCOUNTER — Other Ambulatory Visit: Payer: Self-pay | Admitting: Internal Medicine

## 2023-07-11 DIAGNOSIS — C3411 Malignant neoplasm of upper lobe, right bronchus or lung: Secondary | ICD-10-CM

## 2023-07-12 ENCOUNTER — Ambulatory Visit (INDEPENDENT_AMBULATORY_CARE_PROVIDER_SITE_OTHER): Admitting: Nurse Practitioner

## 2023-07-12 ENCOUNTER — Encounter: Payer: Self-pay | Admitting: Nurse Practitioner

## 2023-07-12 ENCOUNTER — Telehealth: Payer: Self-pay | Admitting: *Deleted

## 2023-07-12 VITALS — BP 108/58 | HR 104 | Temp 98.5°F | Ht 62.0 in | Wt 104.0 lb

## 2023-07-12 DIAGNOSIS — Z87891 Personal history of nicotine dependence: Secondary | ICD-10-CM

## 2023-07-12 DIAGNOSIS — I5020 Unspecified systolic (congestive) heart failure: Secondary | ICD-10-CM | POA: Diagnosis not present

## 2023-07-12 DIAGNOSIS — C3491 Malignant neoplasm of unspecified part of right bronchus or lung: Secondary | ICD-10-CM

## 2023-07-12 DIAGNOSIS — J449 Chronic obstructive pulmonary disease, unspecified: Secondary | ICD-10-CM

## 2023-07-12 MED ORDER — POTASSIUM CHLORIDE CRYS ER 10 MEQ PO TBCR: 10.0000 meq | EXTENDED_RELEASE_TABLET | Freq: Every day | ORAL | 0 refills | Status: DC

## 2023-07-12 NOTE — Telephone Encounter (Signed)
 Called [phone] Dr. Alethea Hutchinson office to try to move up Cardiology appt. From Sept. 2025 for New onset Heart Failure and progressive dyspnea. Spoke with Lamond Pilot which stated that no openings for [facility] until then, but could get in sooner at [facility] or [facility]. I asked them to please call the pt and offer these appts. At these locations since it is an earlier date. Called pt and left VM to expect this call. Provided her with their office number if she does not hear anything later today to call them.

## 2023-07-12 NOTE — Patient Instructions (Addendum)
 Continue Albuterol  inhaler 2 puffs or 3 mL neb every 6 hours as needed for shortness of breath or wheezing. Notify if symptoms persist despite rescue inhaler/neb use.  Continue Stiolto 2 puffs daily Continue astelin  1 spray each nostril Twice daily  Continue zyrtec  1 tab daily Continue flonase  nasal spray 2 sprays each nostril daily   Increase furosemide to 40 mg daily for the next 3 days then return to 20 mg daily. Monitor your blood pressure and if your top number is below 100, hold the extra dose. Monitor for any lightheadedness/dizziness. Take 10 meq of potassium on the days you are taking 40 mg of lasix   Come in for repeat labs in one week - call before you come  Attend appointment with cardiology to review new diagnosis of heart failure and figure out next steps for management  Follow up with oncology as scheduled   Follow up in 6 months with Dr. Baldwin Nicholson or Julie Jerry Candy Ziegler,NP. If symptoms worsen, please contact office for sooner follow up or seek emergency care.

## 2023-07-12 NOTE — Progress Notes (Unsigned)
 @Patient  ID: Julie Nicholson, female    DOB: 02-Dec-1965, 58 y.o.   MRN: [id number]  Chief Complaint  Patient presents with   Follow-up    Sob good and bad days, occass. Cough-clear, denies wheeze    Referring provider: Marlene Simas, MD  HPI: 58 year old female, former smoker (41 pack years) followed for DOE and adenocarcinoma of RUL. She is a patient of Dr. Lacinda Pica and last seen in office on 06/08/2023. She is currently on Imfinzi  and followed by Dr. Marguerita Shih with oncology. Past medical history significant for GERD r/t radiation esophagitis, HLD, chronic cholecystitis.   TEST/EVENTS:  06/25/2021 PFT: FVC 94, FEV1 88, ratio 76, TLC 93, DLCOcor 73.  No BD 09/15/2022 CT chest with contrast: Circumferential wall thickening of esophagus.  Interval development of diffuse micro nodularity throughout both lower lobes, right greater than left.  Most of these measure 1 to 2 mm and are nonspecific, likely infectious or inflammatory.  Stable scarring/fibrosis and architectural distortion in right upper lobe, compatible with former radiation treatments.  Stable mediastinal and hilar lymph nodes.  Previous lesion on right lower lobe not well-visualized.  No new liver lesions. 06/01/2023 CT chest: reduction in left supraclavicular lymph node, now 4 mm. Moderate DDD. Unchanged treated soft tissue in mediastinum and right hilum. Unchanged circumferential wall thickening of mid esophagus. Severe emphysema. Diffused b/l bronchial wall thickening. Unchanged post radiation changes.  06/10/2023 echo: EF 40-45%. GIDD. Global hypokinesis and LV dilation. RV function nl. Mild AR.  04/07/2021: OV with Dr. Baldwin Levee. Quit smoking in November with Wellbutrin . Followed by oncology for adenocarinoma of right lung and on Imfinzi . Felt more fatigued and some progressive DOE - suspected r/t undiagnosed COPD. Initiated trial with Spiriva . 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   05/08/2021: OV with Julie Marcos NP for follow up after being  started on a trial of Spiriva  for DOE and suspected COPD. She reports that she has been having difficulties with taking the Spiriva  because of her cough. Most of the times when she goes to take it, she starts coughing before she can inhale the medicine and feels like she blows it all back out. When she doesn't cough, she does feel like she receives good benefit from the Spiriva  and that it improves her DOE. Other than with inhaler therapy, she reports frequent dry, hacking cough and some chest tightness with deep breathing. She feels like her breathing and chest tightness are unchanged from over the past few months, but does feel like her cough is slightly more frequent. She also notes that her allergies have been worse and she feels like she gets more short of breath when she goes outside. She denies hemoptysis, orthopnea, PND, chest pain or wheezing. She receives good benefit from the tessalon  perles. Feels like GERD well-controlled on protonix .   05/21/2022: OV with Dr. Baldwin Levee.  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  She was on Imfinzi  but stopped due to possible pneumonitis.  Now on palliative systemic chemo with carboplatin , Alimta, Keytruda  followed by Dr. Marguerita Shih. COPD and on Stiolto. Not needed albuterol . Allergic rhinitis. Feeling a bit wiped out and fatigued. Not smoking; on wellbutrin .  Also on Zoloft  for depression.  12/03/2022: OV with Julie Mcgruder NP for follow up.  She has been doing well since she was here last here.  She had 1 flareup in September.  Was seen in urgent care.  She was treated with doxycycline  and prednisone  with improvement.  Symptoms have  not recurred since.  Breathing has been doing well.  She really only gets short winded walking uphill or climbing stairs.  Baseline cough in the mornings; productive with clear phlegm. Takes zyrtec  which helps with this.  Sinus symptoms are stable. No wheezing, chest congestion, leg swelling,  orthopnea, mopped assist, weight loss, anorexia.  She remains smoke-free.  Using Stiolto daily.  Rarely uses her rescue inhaler.  Continues on immune therapy with oncology.  Had recent CT scan for surveillance-awaiting results.  Follow-up with Dr. Marguerita Shih is 11/6.   04/21/2023: OV with Dr. Baldwin Levee.  More dyspnea over the last 2 to 3 weeks.  Could be related to allergies.  Attempt to treat more aggressively.  No changes on her CT that would explain progressive dyspnea.  Continue Stiolto for now.  May decide to change to alternative like Breztri if she continues to have issues.  Follow-up in 4 to 6 weeks.  On palliative chemotherapy and immunotherapy with Dr. Marguerita Shih  06/08/2023: OV with Dr. Baldwin Levee.  Significant COPD with some progressive dyspnea.  No wheezing or flares.  No desaturations on room air.  Recommend ruling out alternative causes with new onset lower extremity edema.  Is over high risk for cardiac effects on chemotherapy.  Echocardiogram ordered for further evaluation.  Recent CT was reassuring.  07/12/2023: Today -follow-up Patient presents today for follow-up.  Feels relatively stable compared to her last visit.  She did have an echocardiogram that showed EF of 40 to 45% and diastolic dysfunction, explains her progressive dyspnea and fatigue.  She is waiting to see cardiology.  Currently does not have an appointment until September.  Will see if we can get this moved up.  Occasional cough with white to yellow phlegm, which is her baseline.  No wheezing, fevers, chills, hemoptysis, weight loss.  Still having lower extremity swelling but no significant weight gain.  She does take Lasix 20 mg daily  Allergies  Allergen Reactions   Carboplatin  Shortness Of Breath, Itching and Cough    See progress note from 06/16/22    Immunization History  Administered Date(s) Administered   Influenza Split 09/01/2012   Influenza, High Dose Seasonal PF 10/23/2021   Influenza, Seasonal, Injecte, Preservative Fre  11/17/2022   Influenza,inj,quad, With Preservative 11/22/2019   Influenza-Unspecified 12/02/2020, 10/28/2021   Moderna Sars-Covid-2 Vaccination 02/05/2019, 03/05/2019, 03/06/2020   Pneumococcal Polysaccharide-23 04/11/2013   Td 09/01/2012   Td (Adult), 2 Lf Tetanus Toxid, Preservative Free 09/01/2012   Tdap 08/20/2015    Past Medical History:  Diagnosis Date   Abdominal discomfort    Cancer (HCC)    Chronic headaches    due to allergies, sinus   COPD (chronic obstructive pulmonary disease) (HCC)    per 2012 chest xray   pt states she doesn not have this now (04/10/2013)   Deviated nasal septum    Eustachian tube dysfunction    GERD (gastroesophageal reflux disease)    occasional uses Tums / Rolaids   Hearing loss    right ear   High cholesterol    History of radiation therapy    right lung 01/07/2021-02/19/2021  Dr Retta Caster   History of radiation therapy    Lymph nodes of head,face,neck- 12/23/22-01/07/23- Dr. Retta Caster   Migraine    only once in a blue moon since RX'd allergy  shots (04/10/2013)   Pancreatitis 02/08/2013   Pneumonia    Rhinitis, allergic     Tobacco History: Social History   Tobacco Use  Smoking Status Former  Current packs/day: 0.00   Average packs/day: 0.5 packs/day for 41.0 years (20.5 ttl pk-yrs)   Types: Cigarettes   Start date: 12/24/1979   Quit date: 12/23/2020   Years since quitting: 2.5   Passive exposure: Current (Husband)  Smokeless Tobacco Never   Counseling given: Not Answered   Outpatient Medications Prior to Visit  Medication Sig Dispense Refill   acetaminophen  (TYLENOL ) 500 MG tablet Take 1,000 mg by mouth every 6 (six) hours as needed for moderate pain.     albuterol  (PROVENTIL ) (2.5 MG/3ML) 0.083% nebulizer solution albuterol  sulfate 2.5 mg/3 mL (0.083 %) solution for nebulization  USE 1 VIAL IN NEBULIZER EVERY 6 HOURS AS NEEDED FOR WHEEZING OR SHORTNESS OF BREATH     albuterol  (VENTOLIN  HFA) 108 (90 Base) MCG/ACT  inhaler Inhale 1-2 puffs into the lungs every 6 (six) hours as needed for wheezing or shortness of breath. 18 g 3   aspirin  EC 81 MG tablet Take 81 mg by mouth at bedtime.     atorvastatin  (LIPITOR) 40 MG tablet Take 40 mg by mouth at bedtime.     azelastine  (ASTELIN ) 0.1 % nasal spray Place 1 spray into both nostrils 2 (two) times daily as needed. Use in each nostril as directed 30 mL 5   benzonatate  (TESSALON ) 200 MG capsule Take 1 capsule (200 mg total) by mouth 3 (three) times daily as needed. 30 capsule 0   Biotin 5000 MCG TABS Take 5,000 mcg by mouth at bedtime.     buPROPion  (WELLBUTRIN  XL) 150 MG 24 hr tablet Take 150 mg by mouth at bedtime.     Ca Carbonate-Mag Hydroxide (ROLAIDS PO) Take 1 tablet by mouth daily as needed (heartburn).     famotidine  (PEPCID ) 20 MG tablet Take 1 tablet by mouth twice daily 60 tablet 0   fluticasone  (FLONASE ) 50 MCG/ACT nasal spray Place 2 sprays into both nostrils daily. 16 g 5   folic acid  (FOLVITE ) 1 MG tablet Take 1 tablet by mouth once daily 30 tablet 0   furosemide (LASIX) 20 MG tablet Take 20 mg by mouth daily.     lidocaine -prilocaine  (EMLA ) cream APPLY TO PORT-A-CATH 30 TO 60 MINUTES BEFORE TREATMENT 30 g 0   linaCLOtide (LINZESS PO) Take by mouth.     loratadine  (CLARITIN ) 10 MG tablet Take 10 mg by mouth daily.     Multiple Vitamins-Minerals (HAIR SKIN AND NAILS FORMULA PO) Take 1 tablet by mouth daily.     ondansetron  (ZOFRAN -ODT) 8 MG disintegrating tablet DISSOLVE 1 TABLET IN MOUTH THREE TIMES DAILY AS NEEDED 20 tablet 0   Oxycodone  HCl 10 MG TABS Take 1 tablet by mouth 3 (three) times daily as needed (pain).     polyethylene glycol powder (MIRALAX ) 17 GM/SCOOP powder Take 17 g by mouth 2 (two) times daily as needed for mild constipation. 255 g 1   prochlorperazine  (COMPAZINE ) 10 MG tablet Take 1 tablet (10 mg total) by mouth every 6 (six) hours as needed. 30 tablet 2   rizatriptan (MAXALT-MLT) 10 MG disintegrating tablet Take 10 mg by mouth  as needed for migraine. May repeat in 2 hours if needed     senna-docusate (SENOKOT-S) 8.6-50 MG tablet Take 2 tablets by mouth 2 (two) times daily between meals as needed for moderate constipation.     sertraline  (ZOLOFT ) 50 MG tablet Take 1 tablet by mouth daily.     Tiotropium Bromide-Olodaterol (STIOLTO RESPIMAT ) 2.5-2.5 MCG/ACT AERS Inhale 2 puffs into the lungs daily. 12 g 3  No facility-administered medications prior to visit.     Review of Systems:   Constitutional: No weight loss or gain, night sweats, fevers, chills. +fatigue (baseline) HEENT: No headaches, difficulty swallowing, tooth/dental problems, or sore throat. No itching, ear ache. +occasional sneezing, occasional nasal congestion - both worse when outside CV:  +swelling in lower extremities. No chest pain, orthopnea, PND, anasarca, dizziness, palpitations, syncope Resp: +shortness of breath with exertion; chronic cough. No excess mucus or change in color of mucus. No hemoptysis. No wheezing.  No chest wall deformity GI:  No heartburn, indigestion, abdominal pain, nausea, vomiting, diarrhea, change in bowel habits, loss of appetite, bloody stools.  GU: No dysuria, change in color of urine, urgency or frequency.   Skin: No rash, lesions, ulcerations MSK:  No joint pain or swelling.   Neuro: No dizziness or lightheadedness.  Psych: +stable depression. No anxiety. No SI/HI. Mood stable.     Physical Exam:  BP (!) 108/58 (BP Location: Right Arm, Patient Position: Sitting, Cuff Size: Normal)   Pulse (!) 104   Temp 98.5 F (36.9 C) (Oral)   Ht 5' 2 (1.575 m)   Wt 104 lb (47.2 kg)   SpO2 99%   BMI 19.02 kg/m   GEN: Pleasant, interactive, well-kempt; in no acute distress. HEENT:  Normocephalic and atraumatic. PERRLA. Sclera white. Nasal turbinates pink, moist and patent bilaterally. No rhinorrhea present. Oropharynx pink and moist, without exudate or edema. No lesions, ulcerations.  NECK:  Supple w/ fair ROM. No JVD  present. Thyroid  symmetrical with no goiter or nodules palpated. No lymphadenopathy.   CV: RRR, no m/r/g, +1 BLE peripheral edema. Pulses intact, +2 bilaterally. No cyanosis, pallor or clubbing. PULMONARY:  Unlabored, regular breathing. Clear bilaterally A&P w/o wheezes/rales/rhonchi. No accessory muscle use. No dullness to percussion. GI: BS present and normoactive. Soft, non-tender to palpation. No organomegaly or masses detected. MSK: No erythema, warmth or tenderness. Cap refil <2 sec all extrem. No deformities or joint swelling noted.  Neuro: A/Ox3. No focal deficits noted.   Skin: Warm, no lesions or rashe Psych: Normal affect and behavior. Judgement and thought content appropriate.     Lab Results:  CBC    Component Value Date/Time   WBC 3.1 (L) 07/13/2023 1244   WBC 1.9 (L) 06/25/2023 0540   RBC 2.70 (L) 07/13/2023 1244   HGB 9.4 (L) 07/13/2023 1244   HCT 28.8 (L) 07/13/2023 1244   PLT 198 07/13/2023 1244   MCV 106.7 (H) 07/13/2023 1244   MCH 34.8 (H) 07/13/2023 1244   MCHC 32.6 07/13/2023 1244   RDW 17.7 (H) 07/13/2023 1244   LYMPHSABS 0.8 07/13/2023 1244   MONOABS 0.5 07/13/2023 1244   EOSABS 0.2 07/13/2023 1244   BASOSABS 0.0 07/13/2023 1244    BMET    Component Value Date/Time   NA 140 07/13/2023 1244   K 3.6 07/13/2023 1244   CL 106 07/13/2023 1244   CO2 26 07/13/2023 1244   GLUCOSE 136 (H) 07/13/2023 1244   BUN 9 07/13/2023 1244   CREATININE 1.08 (H) 07/13/2023 1244   CALCIUM  9.2 07/13/2023 1244   GFRNONAA 59 (L) 07/13/2023 1244   GFRAA >90 04/03/2013 1418    BNP No results found for: BNP   Imaging:  DG Abd Portable 1V Result Date: 06/24/2023 CLINICAL DATA:  Constipation for 2 days, abdominal distension, pain EXAM: PORTABLE ABDOMEN - 1 VIEW COMPARISON:  06/23/2023 FINDINGS: Two supine frontal views of the abdomen and pelvis are obtained. There is residual gas and stool  throughout the colon, with mild decrease in stool burden since prior study. No  evidence of small-bowel obstruction or ileus. There are no masses or abnormal calcifications. Lung bases are clear. No acute bony abnormalities. IMPRESSION: 1. Moderate gas and stool throughout the colon, with slight decrease in stool burden since prior study. Electronically Signed   By: Bobbye Burrow M.D.   On: 06/24/2023 15:04   CT ABDOMEN PELVIS W CONTRAST Result Date: 06/23/2023 CLINICAL DATA:  Left lower quadrant abdominal pain. Non-small cell lung cancer. Metastatic disease evaluation. EXAM: CT ABDOMEN AND PELVIS WITH CONTRAST TECHNIQUE: Multidetector CT imaging of the abdomen and pelvis was performed using the standard protocol following bolus administration of intravenous contrast. RADIATION DOSE REDUCTION: This exam was performed according to the departmental dose-optimization program which includes automated exposure control, adjustment of the mA and/or kV according to patient size and/or use of iterative reconstruction technique. CONTRAST:  OMNIPAQUE  IOHEXOL  300 MG/ML  SOLN COMPARISON:  CT of the chest abdomen pelvis dated 06/01/2023. FINDINGS: Lower chest: The visualized lung bases are clear. No intra-abdominal free air or free fluid. Hepatobiliary: The liver is unremarkable. There is mild biliary dilatation, post cholecystectomy. No retained calcified stone noted in the central CBD. Pancreas: Unremarkable. No pancreatic ductal dilatation or surrounding inflammatory changes. Spleen: Normal in size without focal abnormality. Adrenals/Urinary Tract: The adrenal glands are unremarkable. There is no hydronephrosis on either side. There is symmetric enhancement and excretion of contrast by both kidneys. The visualized ureters and urinary bladder appear unremarkable Stomach/Bowel: There is moderate stool in the distal colon. Mild pericolonic haziness along the descending and sigmoid colon may represent mild stercoral colitis. Clinical correlation is recommended. No bowel obstruction. Appendectomy.  Vascular/Lymphatic: Mild aortoiliac atherosclerotic disease. The IVC is unremarkable. No portal venous gas. There is no adenopathy. Reproductive: Hysterectomy.  No suspicious adnexal masses. Other: None Musculoskeletal: No acute or significant osseous findings. IMPRESSION: 1. Moderate stool in the distal colon with findings of possible mild stercoral colitis. Clinical correlation is recommended. No bowel obstruction. 2. No evidence of metastatic disease in the abdomen or pelvis. 3.  Aortic Atherosclerosis (ICD10-I70.0). Electronically Signed   By: Angus Bark M.D.   On: 06/23/2023 12:36    Transfuse RBC     Date Action Dose Route User   Discharged on 06/25/2023   Admitted on 06/23/2023   06/02/2023 1225 Rate/Dose Change (none) Intravenous Danton Dyers, RN   06/02/2023 1210 New Bag/Given (none) Intravenous Danton Dyers, RN      acetaminophen  (TYLENOL ) tablet 650 mg     Date Action Dose Route User   Discharged on 06/25/2023   Admitted on 06/23/2023   06/02/2023 1048 Given 650 mg Oral Danton Dyers, RN      cyanocobalamin  (VITAMIN B12) injection 1,000 mcg     Date Action Dose Route User   Discharged on 06/25/2023   Admitted on 06/23/2023   06/15/2023 1159 Given 1,000 mcg Intramuscular (Right Deltoid) Priscille Brought, RN      filgrastim -sndz (ZARXIO ) injection 300 mcg     Date Action Dose Route User   Discharged on 06/25/2023   Admitted on 06/23/2023   06/02/2023 1341 Given 300 mcg Subcutaneous (Left Arm) Danton Dyers, RN      filgrastim -sndz (ZARXIO ) injection 300 mcg     Date Action Dose Route User   Discharged on 06/25/2023   Admitted on 06/23/2023   06/03/2023 1135 Given 300 mcg Subcutaneous (Left Arm) Jabier Martens, LPN  filgrastim -sndz (ZARXIO ) injection 300 mcg     Date Action Dose Route User   Discharged on 06/25/2023   Admitted on 06/23/2023   06/04/2023 4098 Given 300 mcg Subcutaneous (Left Arm) Person, Quillian Brunt, RN      heparin  lock flush 100 unit/mL     Date  Action Dose Route User   Discharged on 06/25/2023   Admitted on 06/23/2023   05/31/2023 1245 Given 250 Units Intracatheter Oneita, Allmon A, LPN      heparin  lock flush 100 unit/mL     Date Action Dose Route User   Discharged on 06/25/2023   Admitted on 06/23/2023   06/02/2023 1348 Given 500 Units Intracatheter Danton Dyers, RN      heparin  lock flush 100 unit/mL     Date Action Dose Route User   Discharged on 06/25/2023   Admitted on 06/23/2023   06/15/2023 1330 Given 500 Units Intracatheter Priscille Brought, RN      heparin  lock flush 100 unit/mL     Date Action Dose Route User   07/13/2023 1455 Given 500 Units Intracatheter Keene, Elizabeth C, RN      ondansetron  (ZOFRAN ) injection 8 mg     Date Action Dose Route User   Discharged on 06/25/2023   Admitted on 06/23/2023   05/25/2023 1157 Given 8 mg Intravenous Purgason, Cortney D, RN      ondansetron  (ZOFRAN ) injection 8 mg     Date Action Dose Route User   Discharged on 06/25/2023   Admitted on 06/23/2023   06/02/2023 1157 Given 8 mg Intravenous Danton Dyers, RN      ondansetron  (ZOFRAN ) injection 8 mg     Date Action Dose Route User   Discharged on 06/25/2023   Admitted on 06/23/2023   06/15/2023 1155 Given 8 mg Intravenous Priscille Brought, RN      ondansetron  (ZOFRAN ) injection 8 mg     Date Action Dose Route User   07/13/2023 1344 Given 8 mg Intravenous Nathanial Balboa, RN      pembrolizumab  (KEYTRUDA ) 200 mg in sodium chloride  0.9 % 50 mL chemo infusion     Date Action Dose Route User   Discharged on 06/25/2023   Admitted on 06/23/2023   05/25/2023 1223 Infusion Verify (none) Intravenous Ernestene Headings, RN   05/25/2023 1223 Rate/Dose Change (none) Intravenous Ernestene Headings, RN   05/25/2023 1222 New Bag/Given 200 mg Intravenous Purgason, Cortney D, RN      pembrolizumab  (KEYTRUDA ) 200 mg in sodium chloride  0.9 % 50 mL chemo infusion     Date Action Dose Route User   Discharged on 06/25/2023   Admitted on  06/23/2023   06/15/2023 1236 Infusion Verify (none) Intravenous Priscille Brought, RN   06/15/2023 1236 Rate/Dose Change (none) Intravenous Priscille Brought, RN   06/15/2023 1235 New Bag/Given 200 mg Intravenous Priscille Brought, RN      pembrolizumab  (KEYTRUDA ) 200 mg in sodium chloride  0.9 % 50 mL chemo infusion     Date Action Dose Route User   07/13/2023 1404 Infusion Verify (none) Intravenous Nathanial Balboa, RN   07/13/2023 1404 Rate/Dose Change (none) Intravenous Nathanial Balboa, RN   07/13/2023 1404 New Bag/Given 200 mg Intravenous Nathanial Balboa, RN      PEMEtrexed  Disodium (ALIMTA) 600 mg in sodium chloride  0.9 % 100 mL chemo infusion     Date Action Dose Route User   Discharged on 06/25/2023   Admitted on 06/23/2023  05/25/2023 1307 Rate/Dose Change (none) Intravenous Ernestene Headings, [place]   05/25/2023 1307 New Bag/Given 600 mg Intravenous Lus Salter T, RN      PEMEtrexed  Disodium (ALIMTA) 600 mg in sodium chloride  0.9 % 100 mL chemo infusion     Date Action Dose Route User   Discharged on 06/25/2023   Admitted on 06/23/2023   06/15/2023 1316 Infusion Verify (none) Intravenous Priscille Brought, RN   06/15/2023 1316 New Bag/Given 600 mg Intravenous Nathanial Balboa, RN      PEMEtrexed  Disodium (ALIMTA) 600 mg in sodium chloride  0.9 % 100 mL chemo infusion     Date Action Dose Route User   07/13/2023 1442 Infusion Verify (none) Intravenous Nathanial Balboa, RN   07/13/2023 1442 New Bag/Given 600 mg Intravenous Lus Salter T, RN      0.9 %  sodium chloride  infusion     Date Action Dose Route User   Discharged on 06/25/2023   Admitted on 06/23/2023   05/25/2023 1319 Rate/Dose Change (none) Intravenous Ernestene Headings, RN   05/25/2023 1318 Rate/Dose Change (none) Intravenous Ernestene Headings, RN   05/25/2023 1302 Rate/Dose Change (none) Intravenous Ernestene Headings, RN   05/25/2023 1300 Rate/Dose Change (none) Intravenous Ernestene Headings, RN   05/25/2023 1205 Rate/Dose Change (none) Intravenous  Lus Salter T, RN      0.9 %  sodium chloride  infusion     Date Action Dose Route User   Discharged on 06/25/2023   Admitted on 06/23/2023   06/15/2023 1232 Infusion Verify (none) Intravenous Priscille Brought, RN   06/15/2023 1232 Infusion Verify (none) Intravenous Priscille Brought, RN   06/15/2023 1202 Restarted (none) Intravenous Priscille Brought, RN   06/15/2023 1134 Rate/Dose Change (none) Intravenous Priscille Brought, RN   06/15/2023 1134 New Bag/Given (none) Intravenous Priscille Brought, RN      0.9 %  sodium chloride  infusion     Date Action Dose Route User   07/13/2023 1454 Rate/Dose Change (none) Intravenous Nathanial Balboa, RN   07/13/2023 1442 Rate/Dose Change (none) Intravenous Nathanial Balboa, RN   07/13/2023 1441 Rate/Dose Change (none) Intravenous Nathanial Balboa, RN   07/13/2023 1439 Rate/Dose Change (none) Intravenous Nathanial Balboa, RN   07/13/2023 1404 Infusion Verify (none) Intravenous Nathanial Balboa, RN      sodium chloride  flush (NS) 0.9 % injection 10 mL     Date Action Dose Route User   Discharged on 06/25/2023   Admitted on 06/23/2023   05/25/2023 1031 Given 10 mL Intracatheter Olita Best T, LPN      sodium chloride  flush (NS) 0.9 % injection 10 mL     Date Action Dose Route User   Discharged on 06/25/2023   Admitted on 06/23/2023   05/31/2023 1237 Given 10 mL Intracatheter Barney Boozer A, LPN      sodium chloride  flush (NS) 0.9 % injection 10 mL     Date Action Dose Route User   Discharged on 06/25/2023   Admitted on 06/23/2023   06/02/2023 1015 Given 10 mL Intracatheter Olita Best T, LPN      sodium chloride  flush (NS) 0.9 % injection 10 mL     Date Action Dose Route User   Discharged on 06/25/2023   Admitted on 06/23/2023   06/02/2023 1348 Given 10 mL Intracatheter Danton Dyers, RN      sodium chloride  flush (NS) 0.9 % injection 10 mL     Date Action Dose  Route User   Discharged on 06/25/2023   Admitted on 06/23/2023   06/15/2023  1037 Given 10 mL Intracatheter Sharise, Lippy, LPN      sodium chloride  flush (NS) 0.9 % injection 10 mL     Date Action Dose Route User   Discharged on 06/25/2023   Admitted on 06/23/2023   06/15/2023 1330 Given 10 mL Intracatheter Priscille Brought, RN      sodium chloride  flush (NS) 0.9 % injection 10 mL     Date Action Dose Route User   07/06/2023 1031 Given 10 mL Intracatheter Barney Boozer A, LPN      sodium chloride  flush (NS) 0.9 % injection 10 mL     Date Action Dose Route User   07/13/2023 1246 Given 10 mL Intracatheter Barney Boozer A, LPN      sodium chloride  flush (NS) 0.9 % injection 10 mL     Date Action Dose Route User   07/13/2023 1455 Given 10 mL Intracatheter Nathanial Balboa, RN      0.9 %  sodium chloride  infusion (Manually program via Guardrails IV Fluids)     Date Action Dose Route User   Discharged on 06/25/2023   Admitted on 06/23/2023   06/02/2023 1142 New Bag/Given 100 mL Intravenous Danton Dyers, RN          Latest Ref Rng & Units 06/25/2021    3:34 PM  PFT Results  FVC-Pre L 2.97   FVC-Predicted Pre % 94   FVC-Post L 2.91   FVC-Predicted Post % 92   Pre FEV1/FVC % % 73   Post FEV1/FCV % % 76   FEV1-Pre L 2.16   FEV1-Predicted Pre % 88   FEV1-Post L 2.20   DLCO uncorrected ml/min/mmHg 13.46   DLCO UNC% % 70   DLCO corrected ml/min/mmHg 14.06   DLCO COR %Predicted % 73   DLVA Predicted % 82   TLC L 4.43   TLC % Predicted % 93   RV % Predicted % 63     No results found for: NITRICOXIDE      Assessment & Plan:   HFrEF (heart failure with reduced ejection fraction) (HCC) Suspect this is the driving of her progressive dyspnea given stability of COPD and CT imaging.  Will reach out to cardiology to see if they can move up her appointment.  She does have some bilateral lower extremity edema, consistent with volume overload.  Plan to have her increase her furosemide to 40 mg for the next 3 days.  Will need to monitor BP  closely as she does have a more labile blood pressure.  Recheck BMET next week.  Monitor weights at home.  Patient Instructions  Continue Albuterol  inhaler 2 puffs or 3 mL neb every 6 hours as needed for shortness of breath or wheezing. Notify if symptoms persist despite rescue inhaler/neb use.  Continue Stiolto 2 puffs daily Continue astelin  1 spray each nostril Twice daily  Continue zyrtec  1 tab daily Continue flonase  nasal spray 2 sprays each nostril daily   Increase furosemide to 40 mg daily for the next 3 days then return to 20 mg daily. Monitor your blood pressure and if your top number is below 100, hold the extra dose. Monitor for any lightheadedness/dizziness. Take 10 meq of potassium on the days you are taking 40 mg of lasix   Come in for repeat labs in one week - call before you come  Attend appointment with cardiology to review new  diagnosis of heart failure and figure out next steps for management  Follow up with oncology as scheduled   Follow up in 6 months with Dr. Baldwin Levee or Alston Jerry Skie Vitrano,NP. If symptoms worsen, please contact office for sooner follow up or seek emergency care.    COPD (chronic obstructive pulmonary disease) (HCC) Lung exam clear.  See above.  Continue maintenance regimen.  Action plan in place.  Adenocarcinoma of right lung, stage 4 (HCC) She is on palliative chemotherapy and immunotherapy.  Follows with oncology.  Recent CT imaging was stable.    I spent 35 minutes of dedicated to the care of this patient on the date of this encounter to include pre-visit review of records, face-to-face time with the patient discussing conditions above, post visit ordering of testing, clinical documentation with the electronic health record, making appropriate referrals as documented, and communicating necessary findings to members of the patients care team.  Roetta Clarke, NP 07/13/2023  Pt aware and understands NP's role.

## 2023-07-13 ENCOUNTER — Inpatient Hospital Stay

## 2023-07-13 ENCOUNTER — Other Ambulatory Visit: Payer: Self-pay

## 2023-07-13 ENCOUNTER — Encounter: Payer: Self-pay | Admitting: Nurse Practitioner

## 2023-07-13 VITALS — BP 118/67 | HR 98 | Temp 97.9°F | Resp 19

## 2023-07-13 DIAGNOSIS — T451X5A Adverse effect of antineoplastic and immunosuppressive drugs, initial encounter: Secondary | ICD-10-CM

## 2023-07-13 DIAGNOSIS — I5020 Unspecified systolic (congestive) heart failure: Secondary | ICD-10-CM

## 2023-07-13 DIAGNOSIS — C3491 Malignant neoplasm of unspecified part of right bronchus or lung: Secondary | ICD-10-CM

## 2023-07-13 DIAGNOSIS — I5022 Chronic systolic (congestive) heart failure: Secondary | ICD-10-CM | POA: Insufficient documentation

## 2023-07-13 DIAGNOSIS — Z95828 Presence of other vascular implants and grafts: Secondary | ICD-10-CM

## 2023-07-13 DIAGNOSIS — Z5112 Encounter for antineoplastic immunotherapy: Secondary | ICD-10-CM | POA: Diagnosis not present

## 2023-07-13 HISTORY — DX: Unspecified systolic (congestive) heart failure: I50.20

## 2023-07-13 LAB — CBC WITH DIFFERENTIAL (CANCER CENTER ONLY)
Abs Immature Granulocytes: 0 10*3/uL (ref 0.00–0.07)
Basophils Absolute: 0 10*3/uL (ref 0.0–0.1)
Basophils Relative: 1 %
Eosinophils Absolute: 0.2 10*3/uL (ref 0.0–0.5)
Eosinophils Relative: 5 %
HCT: 28.8 % — ABNORMAL LOW (ref 36.0–46.0)
Hemoglobin: 9.4 g/dL — ABNORMAL LOW (ref 12.0–15.0)
Immature Granulocytes: 0 %
Lymphocytes Relative: 27 %
Lymphs Abs: 0.8 10*3/uL (ref 0.7–4.0)
MCH: 34.8 pg — ABNORMAL HIGH (ref 26.0–34.0)
MCHC: 32.6 g/dL (ref 30.0–36.0)
MCV: 106.7 fL — ABNORMAL HIGH (ref 80.0–100.0)
Monocytes Absolute: 0.5 10*3/uL (ref 0.1–1.0)
Monocytes Relative: 18 %
Neutro Abs: 1.5 10*3/uL — ABNORMAL LOW (ref 1.7–7.7)
Neutrophils Relative %: 49 %
Platelet Count: 198 10*3/uL (ref 150–400)
RBC: 2.7 MIL/uL — ABNORMAL LOW (ref 3.87–5.11)
RDW: 17.7 % — ABNORMAL HIGH (ref 11.5–15.5)
WBC Count: 3.1 10*3/uL — ABNORMAL LOW (ref 4.0–10.5)
nRBC: 0 % (ref 0.0–0.2)

## 2023-07-13 LAB — CMP (CANCER CENTER ONLY)
ALT: 16 U/L (ref 0–44)
AST: 28 U/L (ref 15–41)
Albumin: 3.5 g/dL (ref 3.5–5.0)
Alkaline Phosphatase: 138 U/L — ABNORMAL HIGH (ref 38–126)
Anion gap: 8 (ref 5–15)
BUN: 9 mg/dL (ref 6–20)
CO2: 26 mmol/L (ref 22–32)
Calcium: 9.2 mg/dL (ref 8.9–10.3)
Chloride: 106 mmol/L (ref 98–111)
Creatinine: 1.08 mg/dL — ABNORMAL HIGH (ref 0.44–1.00)
GFR, Estimated: 59 mL/min — ABNORMAL LOW (ref >60)
Glucose, Bld: 136 mg/dL — ABNORMAL HIGH (ref 70–99)
Potassium: 3.6 mmol/L (ref 3.5–5.1)
Sodium: 140 mmol/L (ref 135–145)
Total Bilirubin: 0.5 mg/dL (ref 0.0–1.2)
Total Protein: 6.9 g/dL (ref 6.5–8.1)

## 2023-07-13 MED ORDER — SODIUM CHLORIDE 0.9 % IV SOLN
200.0000 mg | Freq: Once | INTRAVENOUS | Status: AC
  Administered 2023-07-13: 200 mg via INTRAVENOUS
  Filled 2023-07-13: qty 200

## 2023-07-13 MED ORDER — SODIUM CHLORIDE 0.9 % IV SOLN
400.0000 mg/m2 | Freq: Once | INTRAVENOUS | Status: AC
  Administered 2023-07-13: 600 mg via INTRAVENOUS
  Filled 2023-07-13: qty 20

## 2023-07-13 MED ORDER — SODIUM CHLORIDE 0.9% FLUSH
10.0000 mL | INTRAVENOUS | Status: DC | PRN
  Administered 2023-07-13: 10 mL

## 2023-07-13 MED ORDER — ONDANSETRON HCL 4 MG/2ML IJ SOLN
8.0000 mg | Freq: Once | INTRAMUSCULAR | Status: AC
  Administered 2023-07-13: 8 mg via INTRAVENOUS
  Filled 2023-07-13: qty 4

## 2023-07-13 MED ORDER — HEPARIN SOD (PORK) LOCK FLUSH 100 UNIT/ML IV SOLN
500.0000 [IU] | Freq: Once | INTRAVENOUS | Status: AC | PRN
  Administered 2023-07-13: 500 [IU]

## 2023-07-13 MED ORDER — SODIUM CHLORIDE 0.9 % IV SOLN: Freq: Once | INTRAVENOUS | Status: AC

## 2023-07-13 MED ORDER — SODIUM CHLORIDE 0.9% FLUSH
10.0000 mL | Freq: Once | INTRAVENOUS | Status: AC
  Administered 2023-07-13: 10 mL

## 2023-07-13 NOTE — Assessment & Plan Note (Signed)
 Suspect this is the driving of her progressive dyspnea given stability of COPD and CT imaging.  Will reach out to cardiology to see if they can move up her appointment.  She does have some bilateral lower extremity edema, consistent with volume overload.  Plan to have her increase her furosemide to 40 mg for the next 3 days.  Will need to monitor BP closely as she does have a more labile blood pressure.  Recheck BMET next week.  Monitor weights at home.  Patient Instructions  Continue Albuterol  inhaler 2 puffs or 3 mL neb every 6 hours as needed for shortness of breath or wheezing. Notify if symptoms persist despite rescue inhaler/neb use.  Continue Stiolto 2 puffs daily Continue astelin  1 spray each nostril Twice daily  Continue zyrtec  1 tab daily Continue flonase  nasal spray 2 sprays each nostril daily   Increase furosemide to 40 mg daily for the next 3 days then return to 20 mg daily. Monitor your blood pressure and if your top number is below 100, hold the extra dose. Monitor for any lightheadedness/dizziness. Take 10 meq of potassium on the days you are taking 40 mg of lasix   Come in for repeat labs in one week - call before you come  Attend appointment with cardiology to review new diagnosis of heart failure and figure out next steps for management  Follow up with oncology as scheduled   Follow up in 6 months with Dr. Baldwin Levee or Alston Jerry Kate Larock,NP. If symptoms worsen, please contact office for sooner follow up or seek emergency care.

## 2023-07-13 NOTE — Assessment & Plan Note (Signed)
 Lung exam clear.  See above.  Continue maintenance regimen.  Action plan in place.

## 2023-07-13 NOTE — Assessment & Plan Note (Signed)
 She is on palliative chemotherapy and immunotherapy.  Follows with oncology.  Recent CT imaging was stable.

## 2023-07-19 ENCOUNTER — Other Ambulatory Visit: Payer: Self-pay

## 2023-07-19 DIAGNOSIS — E7800 Pure hypercholesterolemia, unspecified: Secondary | ICD-10-CM | POA: Insufficient documentation

## 2023-07-19 DIAGNOSIS — J189 Pneumonia, unspecified organism: Secondary | ICD-10-CM | POA: Insufficient documentation

## 2023-07-19 DIAGNOSIS — J342 Deviated nasal septum: Secondary | ICD-10-CM | POA: Insufficient documentation

## 2023-07-19 DIAGNOSIS — Z923 Personal history of irradiation: Secondary | ICD-10-CM | POA: Insufficient documentation

## 2023-07-19 DIAGNOSIS — H9190 Unspecified hearing loss, unspecified ear: Secondary | ICD-10-CM | POA: Insufficient documentation

## 2023-07-19 DIAGNOSIS — G8929 Other chronic pain: Secondary | ICD-10-CM | POA: Insufficient documentation

## 2023-07-19 DIAGNOSIS — H6990 Unspecified Eustachian tube disorder, unspecified ear: Secondary | ICD-10-CM | POA: Insufficient documentation

## 2023-07-19 DIAGNOSIS — R109 Unspecified abdominal pain: Secondary | ICD-10-CM | POA: Insufficient documentation

## 2023-07-19 DIAGNOSIS — C801 Malignant (primary) neoplasm, unspecified: Secondary | ICD-10-CM | POA: Insufficient documentation

## 2023-07-19 DIAGNOSIS — I87393 Chronic venous hypertension (idiopathic) with other complications of bilateral lower extremity: Secondary | ICD-10-CM | POA: Insufficient documentation

## 2023-07-19 DIAGNOSIS — G43909 Migraine, unspecified, not intractable, without status migrainosus: Secondary | ICD-10-CM | POA: Insufficient documentation

## 2023-07-20 ENCOUNTER — Ambulatory Visit: Attending: Cardiology | Admitting: Cardiology

## 2023-07-20 ENCOUNTER — Encounter: Payer: Self-pay | Admitting: Cardiology

## 2023-07-20 VITALS — BP 110/60 | HR 109 | Ht 62.0 in | Wt 104.1 lb

## 2023-07-20 DIAGNOSIS — D61810 Antineoplastic chemotherapy induced pancytopenia: Secondary | ICD-10-CM | POA: Diagnosis not present

## 2023-07-20 DIAGNOSIS — Z87891 Personal history of nicotine dependence: Secondary | ICD-10-CM

## 2023-07-20 DIAGNOSIS — E785 Hyperlipidemia, unspecified: Secondary | ICD-10-CM | POA: Diagnosis not present

## 2023-07-20 DIAGNOSIS — I5020 Unspecified systolic (congestive) heart failure: Secondary | ICD-10-CM

## 2023-07-20 DIAGNOSIS — T451X5A Adverse effect of antineoplastic and immunosuppressive drugs, initial encounter: Secondary | ICD-10-CM

## 2023-07-20 HISTORY — DX: Personal history of nicotine dependence: Z87.891

## 2023-07-20 NOTE — Progress Notes (Signed)
 Cardiology Office Note:    Date:  07/20/2023   ID:  Julie Nicholson, DOB [birth year], MRN [id number]  PCP:  Marlene Simas, MD  Cardiologist:  Nelia Balzarine, MD   Referring MD: Marlene Simas, MD    ASSESSMENT:    1. Hyperlipidemia, unspecified hyperlipidemia type   2. Antineoplastic chemotherapy induced pancytopenia (HCC)   3. HFrEF (heart failure with reduced ejection fraction) (HCC)   4. Former smoker    PLAN:    In order of problems listed above:  Primary prevention stressed with the patient.  Importance of compliance with diet medication stressed and patient verbalized standing. Heart failure with reduced ejection fraction: Stable at this time.  Salt intake issues, diet and daily weight checks were advised.  She vocalized understanding.  She has very mild pedal edema.  She is not taking diuretics at this time.  Her blood pressure is borderline.  She feels very poorly when her blood pressure goes below this.  Medical management advised at this time.  Will do a Lexiscan sestamibi for some dyspnea on exertion and also to rule out any ischemic substrate.  She is agreeable. Mixed dyslipidemia and aortic atherosclerosis: Managed by primary care.  It emphasized. Not a candidate for guideline directed medical therapy with medications because of borderline blood pressure.  Once Lexiscan sestamibi results are available we will address these issues further.  Patient and daughter had multiple questions which were answered to their satisfaction. Stage IV lung cancer: Managed by primary care, pulmonary and oncology colleagues. Patient will be seen in follow-up appointment in 6 months or earlier if the patient has any concerns.    Medication Adjustments/Labs and Tests Ordered: Current medicines are reviewed at length with the patient today.  Concerns regarding medicines are outlined above.  Orders Placed This Encounter  Procedures   EKG 12-Lead   No orders of the defined types were  placed in this encounter.    History of Present Illness:    Julie Nicholson is a 58 y.o. female who is being seen today for the evaluation of congestive heart failure with reduced ejection fraction at the request of Marlene Simas, MD. patient is a pleasant 58 year old female.  She has been diagnosed with stage IV lung cancer and is being treated for the same.  She has received radiation and chemotherapy.  Her ejection fraction is mildly diminished and therefore she is sent here for evaluation she denies any chest pain orthopnea or PND.  She feels overall tired.  At the time of my evaluation, the patient is alert awake oriented and in no distress.  No syncope.  Past Medical History:  Diagnosis Date   Abdominal discomfort    Acute sinusitis 11/11/2021   Adenocarcinoma of right lung, stage 4 (HCC) 12/18/2020   Allergic rhinitis 05/08/2021   Antineoplastic chemotherapy induced pancytopenia (HCC) 02/23/2021   Aortic atherosclerosis (HCC) 06/24/2023   Cancer (HCC)    Chronic cholecystitis 03/13/2013   Chronic headaches    due to allergies, sinus   Chronic venous hypertension (idiopathic) with other complications of bilateral lower extremity    Constipation 06/23/2023   COPD (chronic obstructive pulmonary disease) (HCC)    per 2012 chest xray   pt states she doesn not have this now (04/10/2013)   COVID-19 virus infection 02/24/2021   Deviated nasal septum    Dyspnea 04/07/2021   Encounter for antineoplastic chemotherapy 12/18/2020   Encounter for antineoplastic immunotherapy 11/17/2022   Eustachian tube dysfunction    GERD (gastroesophageal  reflux disease)    occasional uses Tums / Rolaids   Goals of care, counseling/discussion 03/18/2021   Hearing loss    right ear   HFrEF (heart failure with reduced ejection fraction) (HCC) 07/13/2023   High cholesterol    History of radiation therapy    right lung 01/07/2021-02/19/2021  Dr Retta Caster   History of radiation therapy    Lymph  nodes of head,face,neck- 12/23/22-01/07/23- Dr. 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   RUQ pain 03/06/2013   EUS, EGD negative  HIDA scan with ejection fraction shows increased ejection fraction  No gallstones on ultrasound or EUS     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   Tobacco use 12/09/2020   Upper airway cough syndrome 05/08/2021    Past Surgical History:  Procedure Laterality Date   ABDOMINAL HYSTERECTOMY  1995   tx endometriosis, both ovaries removed   ADENOIDECTOMY     APPENDECTOMY  late 1990's   BIOPSY  02/26/2021   Procedure: BIOPSY;  Surgeon: Janel Medford, MD;  Location: [facility] ENDOSCOPY;  Service: Endoscopy;;   BRONCHIAL BRUSHINGS  12/15/2020   Procedure: BRONCHIAL BRUSHINGS;  Surgeon: Denson Flake, MD;  Location: [facility] ENDOSCOPY;  Service: Cardiopulmonary;;   BRONCHIAL NEEDLE ASPIRATION BIOPSY  12/15/2020   Procedure: BRONCHIAL NEEDLE ASPIRATION BIOPSIES;  Surgeon: Denson Flake, MD;  Location: [facility] ENDOSCOPY;  Service: Cardiopulmonary;;   CHOLECYSTECTOMY  04/10/2013   CHOLECYSTECTOMY N/A 04/10/2013   Procedure: LAPAROSCOPIC CHOLECYSTECTOMY WITH INTRAOPERATIVE CHOLANGIOGRAM;  Surgeon: Harlee Lichtenstein, MD;  Location: [facility] OR;  Service: General;  Laterality: N/A;   ELECTROMAGNETIC NAVIGATION BROCHOSCOPY  12/15/2020   Procedure: ELECTROMAGNETIC NAVIGATION BRONCHOSCOPY;  Surgeon: Denson Flake, MD;  Location[facility] ENDOSCOPY;  Service: Cardiopulmonary;;   ESOPHAGOGASTRODUODENOSCOPY (EGD) WITH PROPOFOL  N/A 02/26/2021   Procedure:  ESOPHAGOGASTRODUODENOSCOPY (EGD) WITH PROPOFOL ;  Surgeon: Janel Medford, MD;  Location: [facility] ENDOSCOPY;  Service: Endoscopy;  Laterality: N/A;   EUS N/A 02/16/2013   Procedure: UPPER ENDOSCOPIC ULTRASOUND (EUS) LINEAR;  Surgeon: Janel Medford, MD;  Location: [facility] ENDOSCOPY;  Service: Endoscopy;  Laterality: N/A;  radial linear   IR IMAGING GUIDED PORT INSERTION  06/30/2022   KNEE ARTHROSCOPY Right 1980's   cartilage OR   LAPAROSCOPIC ENDOMETRIOSIS FULGURATION  1980's   MYRINGOTOMY WITH TUBE PLACEMENT Right 07/13/2018   Procedure: MYRINGOTOMY WITH TUBE PLACEMENT;  Surgeon: Mellody Sprout, MD;  Location[facility];  Service: ENT;  Laterality: Right;   MYRINGOTOMY WITH TUBE PLACEMENT Right 06/04/2021   Procedure: MYRINGOTOMY WITH BUTTERFLY TUBE PLACEMENT;  Surgeon: Mellody Sprout, MD;  Location: [facility] SURGERY CNTR;  Service: ENT;  Laterality: Right;   NASOPHARYNGOSCOPY EUSTATION TUBE BALLOON DILATION Right 07/13/2018   Procedure: NASOPHARYNGOSCOPY EUSTATION TUBE BALLOON DILATION;  Surgeon: Mellody Sprout, MD;  Location: [facility] SURGERY [facility];  Service: ENT;  Laterality: Right;   TONSILLECTOMY AND ADENOIDECTOMY  ~ 1980   adenoidectomy   TUBAL LIGATION  ~ 1987   TURBINATE REDUCTION Right 07/13/2018   Procedure: OUTFRACTURE TURBINATE;  Surgeon: Mellody Sprout, MD;  Location: [facility] SURGERY CN[facility];  Service: ENT;  Laterality:  Right;   TYMPANOSTOMY TUBE PLACEMENT     VIDEO BRONCHOSCOPY WITH ENDOBRONCHIAL ULTRASOUND N/A 12/15/2020   Procedure: ROBOTIC VIDEO BRONCHOSCOPY WITH ENDOBRONCHIAL ULTRASOUND;  Surgeon: Denson Flake, MD;  Location: [facility] ENDOSCOPY;  Service: Cardiopulmonary;  Laterality: N/A;   WRIST SURGERY Left    w/plate    Current Medications: Current Meds  Medication Sig   acetaminophen  (TYLENOL ) 500 MG tablet Take 1,000 mg by mouth every 6 (six) hours as needed for moderate pain.   albuterol  (PROVENTIL ) (2.5 MG/3ML) 0.083% nebulizer solution albuterol  sulfate 2.5 mg/3 mL (0.083  %) solution for nebulization  USE 1 VIAL IN NEBULIZER EVERY 6 HOURS AS NEEDED FOR WHEEZING OR SHORTNESS OF BREATH   albuterol  (VENTOLIN  HFA) 108 (90 Base) MCG/ACT inhaler Inhale 1-2 puffs into the lungs every 6 (six) hours as needed for wheezing or shortness of breath.   aspirin  EC 81 MG tablet Take 81 mg by mouth at bedtime.   atorvastatin  (LIPITOR) 40 MG tablet Take 40 mg by mouth at bedtime.   azelastine  (ASTELIN ) 0.1 % nasal spray Place 1 spray into both nostrils 2 (two) times daily as needed. Use in each nostril as directed   benzonatate  (TESSALON ) 200 MG capsule Take 1 capsule (200 mg total) by mouth 3 (three) times daily as needed.   Biotin 5000 MCG TABS Take 5,000 mcg by mouth at bedtime.   buPROPion  (WELLBUTRIN  XL) 150 MG 24 hr tablet Take 150 mg by mouth at bedtime.   Ca Carbonate-Mag Hydroxide (ROLAIDS PO) Take 1 tablet by mouth daily as needed (heartburn).   famotidine  (PEPCID ) 20 MG tablet Take 1 tablet by mouth twice daily   fluticasone  (FLONASE ) 50 MCG/ACT nasal spray Place 2 sprays into both nostrils daily.   folic acid  (FOLVITE ) 1 MG tablet Take 1 tablet by mouth once daily   furosemide (LASIX) 20 MG tablet Take 20 mg by mouth as needed for fluid or edema.   lidocaine -prilocaine  (EMLA ) cream APPLY TO PORT-A-CATH 30 TO 60 MINUTES BEFORE TREATMENT   linaCLOtide (LINZESS PO) Take 1 tablet by mouth daily.   loratadine  (CLARITIN ) 10 MG tablet Take 10 mg by mouth daily.   Multiple Vitamins-Minerals (HAIR SKIN AND NAILS FORMULA PO) Take 1 tablet by mouth daily.   ondansetron  (ZOFRAN -ODT) 8 MG disintegrating tablet DISSOLVE 1 TABLET IN MOUTH THREE TIMES DAILY AS NEEDED   Oxycodone  HCl 10 MG TABS Take 1 tablet by mouth 3 (three) times daily as needed (pain).   polyethylene glycol powder (MIRALAX ) 17 GM/SCOOP powder Take 17 g by mouth 2 (two) times daily as needed for mild constipation.   prochlorperazine  (COMPAZINE ) 10 MG tablet Take 1 tablet (10 mg total) by mouth every 6 (six) hours as  needed.   rizatriptan (MAXALT-MLT) 10 MG disintegrating tablet Take 10 mg by mouth as needed for migraine. May repeat in 2 hours if needed   senna-docusate (SENOKOT-S) 8.6-50 MG tablet Take 2 tablets by mouth 2 (two) times daily between meals as needed for moderate constipation.   sertraline  (ZOLOFT ) 50 MG tablet Take 1 tablet by mouth daily.   Tiotropium Bromide-Olodaterol (STIOLTO RESPIMAT ) 2.5-2.5 MCG/ACT AERS Inhale 2 puffs into the lungs daily.     Allergies:   Carboplatin    Social History   Socioeconomic History   Marital status: Married    Spouse name: Not on file   Number of children: Not on file   Years of education: 12   Highest education level: 12th grade  Occupational History   Not on file  Tobacco Use   Smoking status: Former    Current packs/day: 0.00    Average packs/day: 0.5 packs/day for 41.0 years (20.5 ttl pk-yrs)    Types: Cigarettes    Start date: 12/24/1979    Quit date: 12/23/2020    Years since quitting: 2.5    Passive exposure: Current (Husband)   Smokeless tobacco: Never  Vaping Use   Vaping status: Former  Substance and Sexual Activity   Alcohol use: Not Currently    Comment: occasional   Drug use: No   Sexual activity: Yes    Birth control/protection: Surgical    Comment: Hysterectomy  Other Topics Concern   Not on file  Social History Narrative   Not on file   Social Drivers of Health   Financial Resource Strain: Not on file  Food Insecurity: No Food Insecurity (06/24/2023)   Hunger Vital Sign    Worried About Running Out of Food in the Last Year: Never true    Ran Out of Food in the Last Year: Never true  Transportation Needs: No Transportation Needs (06/24/2023)   PRAPARE - Administrator, Civil Service (Medical): No    Lack of Transportation (Non-Medical): No  Physical Activity: Not on file  Stress: Not on file  Social Connections: Not on file     Family History: The patient's family history includes COPD in her  maternal aunt, maternal grandfather, maternal uncle, and mother; Cervical cancer in her mother; Diabetes in her father and mother; Rectal cancer in her mother.  ROS:   Please see the history of present illness.    All other systems reviewed and are negative.  EKGs/Labs/Other Studies Reviewed:    The following studies were reviewed today:  EKG Interpretation Date/Time:  Wednesday July 20 2023 12:58:54 EDT Ventricular Rate:  109 PR Interval:  128 QRS Duration:  96 QT Interval:  314 QTC Calculation: 422 R Axis:   86  Text Interpretation: Sinus tachycardia Right atrial enlargement When compared with ECG of 23-Jun-2023 11:17, PREVIOUS ECG IS PRESENT Confirmed by Hillis Lu [phone]) on 07/20/2023 1:14:37 PM     Recent Labs: 06/15/2023: TSH 5.830 07/13/2023: ALT 16; BUN 9; Creatinine 1.08; Hemoglobin 9.4; Platelet Count 198; Potassium 3.6; Sodium 140  Recent Lipid Panel    Component Value Date/Time   CHOL 172 03/01/2013 0733   TRIG 216.0 (H) 03/01/2013 0733   HDL 39.10 03/01/2013 0733   CHOLHDL 4 03/01/2013 0733   VLDL 43.2 (H) 03/01/2013 0733   LDLDIRECT 103.3 03/01/2013 0733    Physical Exam:    VS:  BP 110/60   Pulse (!) 109   Ht 5' 2 (1.575 m)   Wt 104 lb 1.3 oz (47.2 kg)   SpO2 97%   BMI 19.04 kg/m     Wt Readings from Last 3 Encounters:  07/20/23 104 lb 1.3 oz (47.2 kg)  07/12/23 104 lb (47.2 kg)  07/06/23 105 lb 8 oz (47.9 kg)     GEN: Patient is in no acute distress HEENT: Normal NECK: No JVD; No carotid bruits LYMPHATICS: No lymphadenopathy CARDIAC: S1 S2 regular, 2/6 systolic murmur at the apex. RESPIRATORY:  Clear to auscultation without rales, wheezing or rhonchi  ABDOMEN: Soft, non-tender, non-distended MUSCULOSKELETAL:  No edema; No deformity  SKIN: Warm and dry NEUROLOGIC:  Alert and oriented x 3 PSYCHIATRIC:  Normal affect    Signed, Nelia Balzarine, MD  07/20/2023 1:29 PM    [facility]

## 2023-07-20 NOTE — Patient Instructions (Signed)
 Medication Instructions:  Your physician recommends that you continue on your current medications as directed. Please refer to the Current Medication list given to you today.  *If you need a refill on your cardiac medications before your next appointment, please call your pharmacy*  Lab Work: None If you have labs (blood work) drawn today and your tests are completely normal, you will receive your results only by: MyChart Message (if you have MyChart) OR A paper copy in the mail If you have any lab test that is abnormal or we need to change your treatment, we will call you to review the results.  Testing/Procedures:   [facility] at [facility] [address] [place], [place] [address] Phone: [phone]    Please arrive 15 minutes prior to your appointment time for registration and insurance purposes.  The test will take approximately 3 to 4 hours to complete; you may bring reading material.  If someone comes with you to your appointment, they will need to remain in the main lobby due to limited space in the testing area. **If you are pregnant or breastfeeding, please notify the nuclear lab prior to your appointment**  How to prepare for your Myocardial Perfusion Test: Do not eat or drink 3 hours prior to your test, except you may have water. Do not consume products containing caffeine (regular or decaffeinated) 12 hours prior to your test. (ex: coffee, chocolate, sodas, tea). Do wear comfortable clothes (no dresses or overalls) and walking shoes, tennis shoes preferred (No heels or open toe shoes are allowed). Do NOT wear cologne, perfume, aftershave, or lotions (deodorant is allowed). If these instructions are not followed, your test will have to be rescheduled.  Please report to [address] ([facility]), 2nd Floor, for your test.  If you have questions or concerns about your appointment, you can call  the Nuclear Lab at [phone].  If you cannot keep your appointment, please provide 24 hours notification to the Nuclear Lab, to avoid a possible $50 charge to your account.   Follow-Up: At [facility], you and your health needs are our priority.  As part of our continuing mission to provide you with exceptional heart care, our providers are all part of one team.  This team includes your primary Cardiologist (physician) and Advanced Practice Providers or APPs (Physician Assistants and Nurse Practitioners) who all work together to provide you with the care you need, when you need it.  Your next appointment:   6 month(s)  Provider:   Hillis Lu, MD    We recommend signing up for the patient portal called MyChart.  Sign up information is provided on this After Visit Summary.  MyChart is used to connect with patients for Virtual Visits (Telemedicine).  Patients are able to view lab/test results, encounter notes, upcoming appointments, etc.  Non-urgent messages can be sent to your provider as well.   To learn more about what you can do with MyChart, go to [email or web address].   Other Instructions None

## 2023-07-22 ENCOUNTER — Telehealth (HOSPITAL_COMMUNITY): Payer: Self-pay | Admitting: *Deleted

## 2023-07-22 NOTE — Telephone Encounter (Signed)
 Called and spoke to patient as a reminder about her STRESS TEST on 07/29/23 at 10:15. She was also given instructions to prepare for the test.

## 2023-07-25 ENCOUNTER — Encounter: Payer: Self-pay | Admitting: Internal Medicine

## 2023-07-25 ENCOUNTER — Encounter: Payer: Self-pay | Admitting: Physician Assistant

## 2023-07-25 ENCOUNTER — Encounter: Payer: Self-pay | Admitting: General Practice

## 2023-07-25 ENCOUNTER — Other Ambulatory Visit: Payer: Self-pay | Admitting: Cardiology

## 2023-07-25 DIAGNOSIS — Z87891 Personal history of nicotine dependence: Secondary | ICD-10-CM

## 2023-07-25 DIAGNOSIS — T451X5A Adverse effect of antineoplastic and immunosuppressive drugs, initial encounter: Secondary | ICD-10-CM

## 2023-07-25 DIAGNOSIS — E785 Hyperlipidemia, unspecified: Secondary | ICD-10-CM

## 2023-07-25 DIAGNOSIS — I5020 Unspecified systolic (congestive) heart failure: Secondary | ICD-10-CM

## 2023-07-25 NOTE — Progress Notes (Signed)
 [facility] Spiritual Care Note  Due to scheduling conflicts, chaplain has missed Julie Nicholson in infusion. Phoned to follow up. We plan to follow up by phone again in mid-July after she has had her stress test, when it may be more helpful to process, and then hope to visit in person at infusion on 08/24/2023.   [address] [place], [place], [facility] Pager [phone] Voicemail [phone]

## 2023-07-27 ENCOUNTER — Other Ambulatory Visit

## 2023-07-27 ENCOUNTER — Ambulatory Visit: Admitting: Physician Assistant

## 2023-07-27 ENCOUNTER — Ambulatory Visit

## 2023-07-28 NOTE — Progress Notes (Unsigned)
 [facility] OFFICE PROGRESS NOTE  Sherrod Sherrod, MD [address] [place] [address]  DIAGNOSIS: Recurrent/metastatic non-small cell lung cancer initially diagnosed as stage IIIB  (T1b, N3, M0) non-small cell lung cancer, adenocarcinoma she presented with right upper lobe nodule in addition to bulky right hilar, mediastinal, and left supraclavicular lymphadenopathy diagnosed in November 2022.  The patient had evidence for disease recurrence in the mediastinal and right supraclavicular lymphadenopathy in August 2023.  The patient had evidence of metastatic disease in February 2024 with several supraclavicular, thoracic and precarinal lymphadenopathy as well as upper abdominal lymph nodes and small liver lesion.   DETECTED ALTERATION(S) / BIOMARKER(S)% CFDNA OR AMPLIFICATIONASSOCIATED FDA-APPROVED THERAPIESCLINICAL TRIAL AVAILABILITY [id number] ND 0.5 5 50 100 4.7%   RHOAG17E ND 0.5 5 50 100 1.8%   CTNNB1S37C ND 0.5 5 50 100 1.9%   BIOMARKERADDITIONAL DETAILS Tumor Mutational Burden (TMB) 19.02 mut/Mb MSI Status Stable (MSS) PD-L1 Tumor Proportion Score (TPS)* <1%   Biomarker Findings By [facility] on 05/06/2022 Tumor Mutational Burden - 17 Muts/Mb Microsatellite status - MS-Stable Genomic Findings For a complete list of the genes assayed, please refer to the Appendix. CTNNB1 S37C CRKL amplification MAP2K4 loss exons 2-11 TP53 V143A 8 Disease relevant genes with no reportable alterations: ALK, BRAF, EGFR, ERBB2, KRAS, MET, RET, ROS1   PDL1 0%  PRIOR THERAPY: 1) Concurrent chemoradiation with carboplatin  for an AUC of 2 and paclitaxel  45 mg per metered squared.  First dose on 01/05/2021.  Status post 7 cycles of treatment.  Last dose was given February 16, 2021. 2) Consolidation immunotherapy with Imfinzi  1500 Mg IV every 4 weeks.  First dose March 25, 2021.  Status post 3 cycles.  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 3) Palliative radiotherapy to the enlarging right supraclavicular lymphadenopathy under the care of Dr. Shannon expected to be completed on October 28, 2021. 4) Radiation to the enlarging left supraclavicular lymph node under the care of Dr. Shannon. Last dose expected on 01/07/23   CURRENT THERAPY: Palliative systemic chemotherapy with carboplatin  for AUC of 5, Alimta 500 Mg/M2 and Keytruda  200 Mg IV every 3 weeks. First dose April 05, 2022. Status post 22 cycles. Starting from cycle #3 her dose of carboplatin  was reduced to AUC of 4 and Alimta 400 Mg/M2. Starting from cycle #5 the patient will be on maintenance treatment with Alimta and Keytruda  every 3 weeks.   INTERVAL HISTORY: Julie Nicholson 58 y.o. female returns to the clinic today for a follow up visit. She was last seen in the clinic today by Dr. Sherrod on 07/06/23. She had just had a hospitalization for mild coreal colitis. She resumed treatment on 07/06/23 and tolerated it well. She denies any concerns with her bowel habits today. She states it is under control. She denies abdominal pain or blood in the stool.   In the interval since being seen, she saw pulmonary medicine. She also saw cardiology. She had a perfusion study performed which was normal without evidence of ischemia. They requested she have a lipid panel drawn while in the clinic today.   Today she denies any fever or chills. She has night sweats at baseline and she is wondering what she should do about that. She does not get as hungry which she attributes to the warmer weather. She lost about 4 labs. She drinks ensure twice a day.    Her respiratory symptoms include occasional coughing and shortness of breath, particularly when taking a deep  breath. There are no recent changes in these symptoms, and she has not experienced hemoptysis or chest pain.  She is here today for evaluation and repeat blood work before undergoing the next cycle of  treatment.  MEDICAL HISTORY: Past Medical History:  Diagnosis Date   Abdominal discomfort    Acute sinusitis 11/11/2021   Adenocarcinoma of right lung, stage 4 (HCC) 12/18/2020   Allergic rhinitis 05/08/2021   Antineoplastic chemotherapy induced pancytopenia (HCC) 02/23/2021   Aortic atherosclerosis (HCC) 06/24/2023   Cancer (HCC)    Chronic cholecystitis 03/13/2013   Chronic headaches    due to allergies, sinus   Chronic venous hypertension (idiopathic) with other complications of bilateral lower extremity    Constipation 06/23/2023   COPD (chronic obstructive pulmonary disease) (HCC)    per 2012 chest xray   pt states she doesn not have this now (04/10/2013)   COVID-19 virus infection 02/24/2021   Deviated nasal septum    Dyspnea 04/07/2021   Encounter for antineoplastic chemotherapy 12/18/2020   Encounter for antineoplastic immunotherapy 11/17/2022   Eustachian tube dysfunction    GERD (gastroesophageal reflux disease)    occasional uses Tums / Rolaids   Goals of care, counseling/discussion 03/18/2021   Hearing loss    right ear   HFrEF (heart failure with reduced ejection fraction) (HCC) 07/13/2023   High cholesterol    History of radiation therapy    right lung 01/07/2021-02/19/2021  Dr Lynwood Nasuti   History of radiation therapy    Lymph nodes of head,face,neck- 12/23/22-01/07/23- Dr. 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  MEDICATIONS:  Current Outpatient Medications  Medication Sig Dispense Refill   acetaminophen  (TYLENOL ) 500 MG tablet Take 1,000 mg by mouth every 6 (six) hours as needed for moderate pain.     albuterol  (PROVENTIL ) (2.5 MG/3ML) 0.083% nebulizer solution albuterol  sulfate 2.5 mg/3 mL (0.083 %) solution for nebulization  USE 1 VIAL IN NEBULIZER EVERY 6 HOURS AS NEEDED FOR WHEEZING OR SHORTNESS OF BREATH     albuterol  (VENTOLIN  HFA) 108 (90 Base) MCG/ACT inhaler Inhale 1-2 puffs into the lungs every 6 (six) hours as needed for wheezing or shortness of breath. 18 g 3   aspirin  EC 81 MG tablet Take 81 mg by mouth at bedtime.     atorvastatin  (LIPITOR) 40 MG tablet Take 40 mg by mouth at bedtime.     azelastine  (ASTELIN ) 0.1 % nasal spray Place 1 spray into both nostrils 2 (two) times daily as needed. Use in each nostril as directed 30 mL 5   benzonatate  (TESSALON ) 200 MG capsule Take 1 capsule (200 mg total) by mouth 3 (three) times daily as needed. 30 capsule 0   Biotin 5000 MCG TABS Take 5,000 mcg by mouth  at bedtime.     buPROPion  (WELLBUTRIN  XL) 150 MG 24 hr tablet Take 150 mg by mouth at bedtime.     Ca Carbonate-Mag Hydroxide (ROLAIDS PO) Take 1 tablet by mouth daily as needed (heartburn).     famotidine  (PEPCID ) 20 MG tablet Take 1 tablet by mouth twice daily 60 tablet 0   fluticasone  (FLONASE ) 50 MCG/ACT nasal spray Place 2 sprays into both nostrils daily. 16 g 5   folic acid  (FOLVITE ) 1 MG tablet Take 1 tablet by mouth once daily 30 tablet 0   furosemide (LASIX) 20 MG tablet Take 20 mg by mouth as needed for fluid or edema.     lidocaine -prilocaine  (EMLA ) cream APPLY TO PORT-A-CATH 30 TO 60 MINUTES BEFORE TREATMENT 30 g 0   linaCLOtide (LINZESS PO) Take 1 tablet by mouth daily.     loratadine  (CLARITIN ) 10 MG  tablet Take 10 mg by mouth daily.     Multiple Vitamins-Minerals (HAIR SKIN AND NAILS FORMULA PO) Take 1 tablet by mouth daily.     ondansetron  (ZOFRAN -ODT) 8 MG disintegrating tablet DISSOLVE 1 TABLET IN MOUTH THREE TIMES DAILY AS NEEDED 20 tablet 0   Oxycodone  HCl 10 MG TABS Take 1 tablet by mouth 3 (three) times daily as needed (pain).     polyethylene glycol powder (MIRALAX ) 17 GM/SCOOP powder Take 17 g by mouth 2 (two) times daily as needed for mild constipation. 255 g 1   prochlorperazine  (COMPAZINE ) 10 MG tablet Take 1 tablet (10 mg total) by mouth every 6 (six) hours as needed. 30 tablet 2   rizatriptan (MAXALT-MLT) 10 MG disintegrating tablet Take 10 mg by mouth as needed for migraine. May repeat in 2 hours if needed     senna-docusate (SENOKOT-S) 8.6-50 MG tablet Take 2 tablets by mouth 2 (two) times daily between meals as needed for moderate constipation.     sertraline  (ZOLOFT ) 50 MG tablet Take 1 tablet by mouth daily.     Tiotropium Bromide-Olodaterol (STIOLTO RESPIMAT ) 2.5-2.5 MCG/ACT AERS Inhale 2 puffs into the lungs daily. 12 g 3   No current facility-administered medications for this visit.    SURGICAL HISTORY:  Past Surgical History:  Procedure Laterality Date   ABDOMINAL HYSTERECTOMY  1995   tx endometriosis, both ovaries removed   ADENOIDECTOMY     APPENDECTOMY  late 1990's   BIOPSY  02/26/2021   Procedure: BIOPSY;  Surgeon: Teressa Toribio SQUIBB, MD;  Location: [facility] ENDOSCOPY;  Service: Endoscopy;;   BRONCHIAL BRUSHINGS  12/15/2020   Procedure: BRONCHIAL BRUSHINGS;  Surgeon: Shelah Lamar RAMAN, MD;  Location: [facility] ENDOSCOPY;  Service: Cardiopulmonary;;   BRONCHIAL NEEDLE ASPIRATION BIOPSY  12/15/2020   Procedure: BRONCHIAL NEEDLE ASPIRATION BIOPSIES;  Surgeon: Shelah Lamar RAMAN, MD;  Location: [facility] ENDOSCOPY;  Service: Cardiopulmonary;;   CHOLECYSTECTOMY  04/10/2013   CHOLECYSTECTOMY N/A 04/10/2013   Procedure: LAPAROSCOPIC CHOLECYSTECTOMY WITH INTRAOPERATIVE CHOLANGIOGRAM;   Surgeon: Krystal JINNY Russell, MD;  Location: [facility] OR;  Service: General;  Laterality: N/A;   ELECTROMAGNETIC NAVIGATION BROCHOSCOPY  12/15/2020   Procedure: ELECTROMAGNETIC NAVIGATION BRONCHOSCOPY;  Surgeon: Shelah Lamar RAMAN, MD;  Location: [facility] ENDOSCOPY;  Service: Cardiopulmonary;;   ESOPHAGOGASTRODUODENOSCOPY (EGD) WITH PROPOFOL  N/A 02/26/2021   Procedure: ESOPHAGOGASTRODUODENOSCOPY (EGD) WITH PROPOFOL ;  Surgeon: Teressa Toribio SQUIBB, MD;  Location: [facility] ENDOSCOPY;  Service: Endoscopy;  Laterality: N/A;   EUS N/A 02/16/2013   Procedure: UPPER ENDOSCOPIC ULTRASOUND (EUS) LINEAR;  Surgeon: Toribio SQUIBB Teressa, MD;  Location: [facility] ENDOSCOPY;  Service: Endoscopy;  Laterality: N/A;  radial linear  IR IMAGING GUIDED PORT INSERTION  06/30/2022   KNEE ARTHROSCOPY Right 1980's   cartilage OR   LAPAROSCOPIC ENDOMETRIOSIS FULGURATION  1980's   MYRINGOTOMY WITH TUBE PLACEMENT Right 07/13/2018   Procedure: MYRINGOTOMY WITH TUBE PLACEMENT;  Surgeon: Edda Mt, MD;  Location: [facility];  Service: ENT;  Laterality: Right;   MYRINGOTOMY WITH TUBE PLACEMENT Right 06/04/2021   Procedure: MYRINGOTOMY WITH BUTTERFLY TUBE PLACEMENT;  Surgeon: Edda Mt, MD;  Location: [facility];  Service: ENT;  Laterality: Right;   NASOPHARYNGOSCOPY EUSTATION TUBE BALLOON DILATION Right 07/13/2018   Procedure: NASOPHARYNGOSCOPY EUSTATION TUBE BALLOON DILATION;  Surgeon: Edda Mt, MD;  Location: [facility];  Service: ENT;  Laterality: Right;   TONSILLECTOMY AND ADENOIDECTOMY  ~ 1980   adenoidectomy   TUBAL LIGATION  ~ 1987   TURBINATE REDUCTION Right 07/13/2018   Procedure: OUTFRACTURE TURBINATE;  Surgeon: Edda Mt, MD;  Location: [facility];  Service: ENT;  Laterality: Right;   TYMPANOSTOMY TUBE PLACEMENT     VIDEO BRONCHOSCOPY WITH ENDOBRONCHIAL ULTRASOUND N/A 12/15/2020   Procedure: ROBOTIC VIDEO BRONCHOSCOPY WITH ENDOBRONCHIAL ULTRASOUND;  Surgeon: Shelah Lamar RAMAN, MD;  Location: [facility]  ENDOSCOPY;  Service: Cardiopulmonary;  Laterality: N/A;   WRIST SURGERY Left    w/plate    REVIEW OF SYSTEMS:   Review of Systems  Constitutional: Positive for decreased appetite and weight loss. Negative for  chills and fever.  HENT: Negative for mouth sores, nosebleeds, sore throat and trouble swallowing.   Eyes: Negative for eye problems and icterus.  Respiratory: Positive for intermittent cough and shortness of breath with deep breaths. Negative for hemoptysis and wheezing.   Cardiovascular: Negative for chest pain and leg swelling.  Gastrointestinal: Negative for abdominal pain, constipation, diarrhea, nausea and vomiting.  Genitourinary: Negative for bladder incontinence, difficulty urinating, dysuria, frequency and hematuria.   Musculoskeletal: Positive for occasional right sided back pain. Negative for gait problem, neck pain and neck stiffness.  Skin: Negative for itching and rash.  Neurological: Negative for dizziness, extremity weakness, gait problem, headaches, light-headedness and seizures.  Hematological: Negative for adenopathy. Does not bruise/bleed easily.  Psychiatric/Behavioral: Negative for confusion, depression and sleep disturbance. The patient is not nervous/anxious.     PHYSICAL EXAMINATION:  There were no vitals taken for this visit.  ECOG PERFORMANCE STATUS: 1  Physical Exam  Constitutional: Oriented to person, place, and time and well-developed, well-nourished, and in no distress.  HENT:  Head: Normocephalic and atraumatic.  Mouth/Throat: Oropharynx is clear and moist. No oropharyngeal exudate.  Eyes: Conjunctivae are normal. Right eye exhibits no discharge. Left eye exhibits no discharge. No scleral icterus.  Neck: Normal range of motion. Neck supple.  Cardiovascular: Normal rate, regular rhythm, normal heart sounds and intact distal pulses.   Pulmonary/Chest: Effort normal and breath sounds normal. No respiratory distress. No wheezes. No rales.   Abdominal: Soft. Bowel sounds are normal. Exhibits no distension and no mass. There is no tenderness.  Musculoskeletal: Normal range of motion. Exhibits no edema.  Lymphadenopathy:    Improved left supraclavicular adenopathy.  Neurological: Alert and oriented to person, place, and time. Exhibits normal muscle tone. Gait normal. Coordination normal.  Skin: Skin is warm and dry. No rash noted. Not diaphoretic. No erythema. No pallor.  Psychiatric: Mood, memory and judgment normal.  Vitals reviewed.  LABORATORY DATA: Lab Results  Component Value Date   WBC 3.1 (L) 07/13/2023   HGB 9.4 (L) 07/13/2023   HCT 28.8 (L) 07/13/2023   MCV 106.7 (H) 07/13/2023  PLT 198 07/13/2023      Chemistry      Component Value Date/Time   NA 140 07/13/2023 1244   K 3.6 07/13/2023 1244   CL 106 07/13/2023 1244   CO2 26 07/13/2023 1244   BUN 9 07/13/2023 1244   CREATININE 1.08 (H) 07/13/2023 1244      Component Value Date/Time   CALCIUM  9.2 07/13/2023 1244   ALKPHOS 138 (H) 07/13/2023 1244   AST 28 07/13/2023 1244   ALT 16 07/13/2023 1244   BILITOT 0.5 07/13/2023 1244       RADIOGRAPHIC STUDIES:  No results found.   ASSESSMENT/PLAN:  This is a very pleasant 58 year old Caucasian female with recurrent lung cancer initially diagnosed as stage IIIB (T1b, N3, M0) non-small cell lung cancer, adenocarcinoma.  She presented with a right upper lobe spiculated nodule and right hilar, mediastinal, and left supraclavicular adenopathy.  She was diagnosed in November 2022.   Therefore, the patient had guardant 360 performed which showed no actionable mutations.  Her PD-L1 expression is less than 1%.  She was found to have metastatic disease in February 2024   The patient completed a course of concurrent chemoradiation with carboplatin  for an AUC of 2 and paclitaxel  45 mg per metered square.  She status post 7 cycles.  She had some significant odynophagia and dysphagia from treatment.   She was seen by Dr.  Teressa while admitted to the hospital and a EGD was performed which showed single 1cm oval shaped very shallow ulceration.  She recovered.   She then had consolidation immunotherapy with Imfinzi  1500 mg IV every 4 weeks. She is status post 3 cycles. She developed suspicious immunotherapy mediated pneumonitis and this was ultimately discontinued.   The patient was seen by Dr. Shannon and started palliative radiotherapy to the enlarging right supraclavicular lymphadenopathy. This was completed on October 28, 2021.   The patient was found to have metastatic disease in February 2024 with several lymphadenopathy in the chest as well as supraclavicular, upper abdomen as well as suspicious small liver metastasis. 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  She is currently undergoing systemic chemotherapy with carboplatin  for AUC of 5, Alimta 500 Mg/M2 and Keytruda  200 Mg IV every 3 weeks status post 21 cycles.  Starting from cycle #3, Dr. Sherrod reduced her dose of carboplatin  to AUC of 4 and Alimta 400 Mg/M2. Starting from cycle #5 the patient started on maintenance treatment with Alimta and Keytruda  every 3 weeks.   Labs were reviewed. Recommend that she proceed with cycle #22 today scheduled.  We will see her back for labs and a follow-up visit in 3 weeks for evaluation and repeat blood work before undergoing cycle #23.   I will arrange for a restaging CT of the chest and neck prior to her next appointment. I will not arrange for CT of the AP since she had this done about 1 month ago and there as no metastatic disease.   Anemia Anemia with hemoglobin level of 8.3 causing fatigue. - Monitor hemoglobin levels closely. - Collect an extra tube of blood at the next visit for potential typing and screening if hemoglobin drops  below 8.  Hypokalemia Mild hypokalemia not severe enough for potassium supplementation. Dietary modification suggested. - Advise consumption of a banana daily for the next few days to improve potassium levels.  The patient was advised to call immediately if she has any concerning symptoms in the interval. The patient voices understanding of current disease status and treatment options and is in agreement with the current care plan. All questions were answered. The patient knows to call the clinic with any problems, questions or concerns. We can certainly see the patient much sooner if necessary        No orders of the defined types were placed in this encounter.    The total time spent in the appointment was 20-29 minutes  Thomasene Dubow L Morrissa Shein, PA-C 07/28/23

## 2023-07-29 ENCOUNTER — Ambulatory Visit (HOSPITAL_COMMUNITY)
Admission: RE | Admit: 2023-07-29 | Discharge: 2023-07-29 | Disposition: A | Discharge status: Home / Self Care | Source: Ambulatory Visit | Attending: Cardiology | Admitting: Cardiology

## 2023-07-29 DIAGNOSIS — D61810 Antineoplastic chemotherapy induced pancytopenia: Secondary | ICD-10-CM | POA: Diagnosis present

## 2023-07-29 DIAGNOSIS — I5020 Unspecified systolic (congestive) heart failure: Secondary | ICD-10-CM | POA: Insufficient documentation

## 2023-07-29 DIAGNOSIS — E785 Hyperlipidemia, unspecified: Secondary | ICD-10-CM | POA: Insufficient documentation

## 2023-07-29 DIAGNOSIS — R0609 Other forms of dyspnea: Secondary | ICD-10-CM | POA: Diagnosis present

## 2023-07-29 DIAGNOSIS — T451X5A Adverse effect of antineoplastic and immunosuppressive drugs, initial encounter: Secondary | ICD-10-CM | POA: Insufficient documentation

## 2023-07-29 DIAGNOSIS — Z87891 Personal history of nicotine dependence: Secondary | ICD-10-CM | POA: Diagnosis present

## 2023-07-29 LAB — MYOCARDIAL PERFUSION IMAGING
LV dias vol: 58 mL (ref 46–106)
LV sys vol: 17 mL (ref 3.8–5.2)
Nuc Stress EF: 71 %
Peak HR: 115 {beats}/min
Rest HR: 104 {beats}/min
Rest Nuclear Isotope Dose: 10.3 mCi
SDS: 0
SRS: 2
SSS: 1
ST Depression (mm): 0 mm
Stress Nuclear Isotope Dose: 31.1 mCi
TID: 0.97

## 2023-07-29 MED ORDER — TECHNETIUM TC 99M TETROFOSMIN IV KIT
31.1000 | PACK | Freq: Once | INTRAVENOUS | Status: AC | PRN
  Administered 2023-07-29: 31.1 via INTRAVENOUS

## 2023-07-29 MED ORDER — REGADENOSON 0.4 MG/5ML IV SOLN
0.4000 mg | Freq: Once | INTRAVENOUS | Status: AC
  Administered 2023-07-29: 0.4 mg via INTRAVENOUS

## 2023-07-29 MED ORDER — TECHNETIUM TC 99M TETROFOSMIN IV KIT
10.3000 | PACK | Freq: Once | INTRAVENOUS | Status: AC | PRN
  Administered 2023-07-29: 10.3 via INTRAVENOUS

## 2023-07-29 MED ORDER — REGADENOSON 0.4 MG/5ML IV SOLN
INTRAVENOUS | Status: AC
  Filled 2023-07-29: qty 5

## 2023-08-02 ENCOUNTER — Ambulatory Visit

## 2023-08-02 ENCOUNTER — Other Ambulatory Visit

## 2023-08-02 ENCOUNTER — Ambulatory Visit: Payer: Self-pay | Admitting: Cardiology

## 2023-08-02 ENCOUNTER — Encounter: Payer: Self-pay | Admitting: Internal Medicine

## 2023-08-02 ENCOUNTER — Encounter: Payer: Self-pay | Admitting: Physician Assistant

## 2023-08-02 ENCOUNTER — Ambulatory Visit: Admitting: Physician Assistant

## 2023-08-02 DIAGNOSIS — E785 Hyperlipidemia, unspecified: Secondary | ICD-10-CM

## 2023-08-03 ENCOUNTER — Inpatient Hospital Stay (HOSPITAL_BASED_OUTPATIENT_CLINIC_OR_DEPARTMENT_OTHER): Admitting: Physician Assistant

## 2023-08-03 ENCOUNTER — Inpatient Hospital Stay

## 2023-08-03 ENCOUNTER — Inpatient Hospital Stay: Attending: Internal Medicine

## 2023-08-03 VITALS — HR 97

## 2023-08-03 VITALS — BP 117/64 | HR 106 | Temp 97.6°F | Resp 16 | Wt 100.0 lb

## 2023-08-03 DIAGNOSIS — Z5111 Encounter for antineoplastic chemotherapy: Secondary | ICD-10-CM

## 2023-08-03 DIAGNOSIS — D649 Anemia, unspecified: Secondary | ICD-10-CM | POA: Insufficient documentation

## 2023-08-03 DIAGNOSIS — C3411 Malignant neoplasm of upper lobe, right bronchus or lung: Secondary | ICD-10-CM | POA: Insufficient documentation

## 2023-08-03 DIAGNOSIS — C770 Secondary and unspecified malignant neoplasm of lymph nodes of head, face and neck: Secondary | ICD-10-CM | POA: Diagnosis not present

## 2023-08-03 DIAGNOSIS — Z452 Encounter for adjustment and management of vascular access device: Secondary | ICD-10-CM | POA: Diagnosis not present

## 2023-08-03 DIAGNOSIS — C3491 Malignant neoplasm of unspecified part of right bronchus or lung: Secondary | ICD-10-CM

## 2023-08-03 DIAGNOSIS — E876 Hypokalemia: Secondary | ICD-10-CM | POA: Diagnosis not present

## 2023-08-03 DIAGNOSIS — Z5112 Encounter for antineoplastic immunotherapy: Secondary | ICD-10-CM | POA: Diagnosis present

## 2023-08-03 LAB — CMP (CANCER CENTER ONLY)
ALT: 20 U/L (ref 0–44)
AST: 29 U/L (ref 15–41)
Albumin: 3.3 g/dL — ABNORMAL LOW (ref 3.5–5.0)
Alkaline Phosphatase: 107 U/L (ref 38–126)
Anion gap: 8 (ref 5–15)
BUN: 13 mg/dL (ref 6–20)
CO2: 26 mmol/L (ref 22–32)
Calcium: 9.4 mg/dL (ref 8.9–10.3)
Chloride: 106 mmol/L (ref 98–111)
Creatinine: 1.03 mg/dL — ABNORMAL HIGH (ref 0.44–1.00)
GFR, Estimated: >60 mL/min (ref >60)
Glucose, Bld: 120 mg/dL — ABNORMAL HIGH (ref 70–99)
Potassium: 3.3 mmol/L — ABNORMAL LOW (ref 3.5–5.1)
Sodium: 140 mmol/L (ref 135–145)
Total Bilirubin: 0.5 mg/dL (ref 0.0–1.2)
Total Protein: 7.2 g/dL (ref 6.5–8.1)

## 2023-08-03 LAB — CBC WITH DIFFERENTIAL (CANCER CENTER ONLY)
Abs Immature Granulocytes: 0.02 10*3/uL (ref 0.00–0.07)
Basophils Absolute: 0 10*3/uL (ref 0.0–0.1)
Basophils Relative: 0 %
Eosinophils Absolute: 0.1 10*3/uL (ref 0.0–0.5)
Eosinophils Relative: 5 %
HCT: 24.7 % — ABNORMAL LOW (ref 36.0–46.0)
Hemoglobin: 8.3 g/dL — ABNORMAL LOW (ref 12.0–15.0)
Immature Granulocytes: 1 %
Lymphocytes Relative: 19 %
Lymphs Abs: 0.6 10*3/uL — ABNORMAL LOW (ref 0.7–4.0)
MCH: 35.5 pg — ABNORMAL HIGH (ref 26.0–34.0)
MCHC: 33.6 g/dL (ref 30.0–36.0)
MCV: 105.6 fL — ABNORMAL HIGH (ref 80.0–100.0)
Monocytes Absolute: 0.5 10*3/uL (ref 0.1–1.0)
Monocytes Relative: 17 %
Neutro Abs: 1.7 10*3/uL (ref 1.7–7.7)
Neutrophils Relative %: 58 %
Platelet Count: 217 10*3/uL (ref 150–400)
RBC: 2.34 MIL/uL — ABNORMAL LOW (ref 3.87–5.11)
RDW: 16.6 % — ABNORMAL HIGH (ref 11.5–15.5)
WBC Count: 2.9 10*3/uL — ABNORMAL LOW (ref 4.0–10.5)
nRBC: 0 % (ref 0.0–0.2)

## 2023-08-03 MED ORDER — HEPARIN SOD (PORK) LOCK FLUSH 100 UNIT/ML IV SOLN
500.0000 [IU] | Freq: Once | INTRAVENOUS | Status: AC | PRN
Start: 2023-08-03 — End: 2023-08-03
  Administered 2023-08-03: 500 [IU]

## 2023-08-03 MED ORDER — SODIUM CHLORIDE 0.9 % IV SOLN: Freq: Once | INTRAVENOUS | Status: AC

## 2023-08-03 MED ORDER — SODIUM CHLORIDE 0.9% FLUSH
10.0000 mL | INTRAVENOUS | Status: DC | PRN
  Administered 2023-08-03: 10 mL

## 2023-08-03 MED ORDER — SODIUM CHLORIDE 0.9 % IV SOLN
200.0000 mg | Freq: Once | INTRAVENOUS | Status: AC
  Administered 2023-08-03: 200 mg via INTRAVENOUS
  Filled 2023-08-03: qty 200

## 2023-08-03 MED ORDER — ONDANSETRON HCL 4 MG/2ML IJ SOLN
8.0000 mg | Freq: Once | INTRAMUSCULAR | Status: AC
  Administered 2023-08-03: 8 mg via INTRAVENOUS
  Filled 2023-08-03: qty 4

## 2023-08-03 MED ORDER — SODIUM CHLORIDE 0.9 % IV SOLN
400.0000 mg/m2 | Freq: Once | INTRAVENOUS | Status: AC
  Administered 2023-08-03: 600 mg via INTRAVENOUS
  Filled 2023-08-03: qty 20

## 2023-08-03 NOTE — Telephone Encounter (Signed)
-----   Message from Jennifer SAUNDERS Revankar sent at 08/02/2023  3:18 PM EDT ----- Coronary artery calcium  noted.  Aspirin .  Please bring patient in for Chem-7 liver lipid check.  Copy primary Jennifer SAUNDERS Crape, MD 08/02/2023 3:18 PM  ----- Message ----- From: Kate Lonni CROME, MD Sent: 07/29/2023   6:27 PM EDT To: Jennifer SAUNDERS Crape, MD

## 2023-08-03 NOTE — Telephone Encounter (Signed)
 MyChart message

## 2023-08-03 NOTE — Patient Instructions (Signed)
 [facility] - A DEPT OF [facility]  Discharge Instructions: Thank you for choosing [facility] to provide your oncology and hematology care.   If you have a lab appointment with the Cancer Center, please go directly to the Cancer Center and check in at the registration area.   Wear comfortable clothing and clothing appropriate for easy access to any Portacath or PICC line.   We strive to give you quality time with your provider. You may need to reschedule your appointment if you arrive late (15 or more minutes).  Arriving late affects you and other patients whose appointments are after yours.  Also, if you miss three or more appointments without notifying the office, you may be dismissed from the clinic at the provider's discretion.      For prescription refill requests, have your pharmacy contact our office and allow 72 hours for refills to be completed.    Today you received the following chemotherapy and/or immunotherapy agents: Keytruda, Alimta.       To help prevent nausea and vomiting after your treatment, we encourage you to take your nausea medication as directed.  BELOW ARE SYMPTOMS THAT SHOULD BE REPORTED IMMEDIATELY: *FEVER GREATER THAN 100.4 F (38 C) OR HIGHER *CHILLS OR SWEATING *NAUSEA AND VOMITING THAT IS NOT CONTROLLED WITH YOUR NAUSEA MEDICATION *UNUSUAL SHORTNESS OF BREATH *UNUSUAL BRUISING OR BLEEDING *URINARY PROBLEMS (pain or burning when urinating, or frequent urination) *BOWEL PROBLEMS (unusual diarrhea, constipation, pain near the anus) TENDERNESS IN MOUTH AND THROAT WITH OR WITHOUT PRESENCE OF ULCERS (sore throat, sores in mouth, or a toothache) UNUSUAL RASH, SWELLING OR PAIN  UNUSUAL VAGINAL DISCHARGE OR ITCHING   Items with * indicate a potential emergency and should be followed up as soon as possible or go to the Emergency Department if any problems should occur.  Please show the CHEMOTHERAPY ALERT CARD or  IMMUNOTHERAPY ALERT CARD at check-in to the Emergency Department and triage nurse.  Should you have questions after your visit or need to cancel or reschedule your appointment, please contact [facility] - A DEPT OF [facility]  Dept: [phone]  and follow the prompts.  Office hours are 8:00 a.m. to 4:30 p.m. Monday - Friday. Please note that voicemails left after 4:00 p.m. may not be returned until the following business day.  We are closed weekends and major holidays. You have access to a nurse at all times for urgent questions. Please call the main number to the clinic Dept: [phone] and follow the prompts.   For any non-urgent questions, you may also contact your provider using MyChart. We now offer e-Visits for anyone 61 and older to request care online for non-urgent symptoms. For details visit [email or web address].   Also download the MyChart app! Go to the app store, search "MyChart", open the app, select , and log in with your MyChart username and password.

## 2023-08-04 LAB — LIPID PANEL
Chol/HDL Ratio: 4 ratio (ref 0.0–4.4)
Cholesterol, Total: 132 mg/dL (ref 100–199)
HDL: 33 mg/dL — ABNORMAL LOW (ref >39)
LDL Chol Calc (NIH): 78 mg/dL (ref 0–99)
Triglycerides: 114 mg/dL (ref 0–149)
VLDL Cholesterol Cal: 21 mg/dL (ref 5–40)

## 2023-08-08 ENCOUNTER — Encounter: Payer: Self-pay | Admitting: Internal Medicine

## 2023-08-08 ENCOUNTER — Encounter: Payer: Self-pay | Admitting: Physician Assistant

## 2023-08-09 ENCOUNTER — Telehealth: Payer: Self-pay | Admitting: Medical Oncology

## 2023-08-09 NOTE — Telephone Encounter (Signed)
 Returned patient's call to follow up on leg cramps. Patient denied pain, swelling, redness, or warmth. She reported that the cramps lasted approximately 48 hours and have since resolved. Reviewed potassium-rich diet and encouraged incorporation of potassium-rich foods. Advised patient to keep scheduled appointments on 07/23.

## 2023-08-10 ENCOUNTER — Encounter: Payer: Self-pay | Admitting: Physician Assistant

## 2023-08-10 ENCOUNTER — Encounter: Payer: Self-pay | Admitting: Internal Medicine

## 2023-08-17 ENCOUNTER — Other Ambulatory Visit

## 2023-08-17 ENCOUNTER — Ambulatory Visit: Admitting: Internal Medicine

## 2023-08-17 ENCOUNTER — Ambulatory Visit

## 2023-08-18 ENCOUNTER — Ambulatory Visit (HOSPITAL_COMMUNITY)
Admission: RE | Admit: 2023-08-18 | Discharge: 2023-08-18 | Disposition: A | Discharge status: Home / Self Care | Source: Ambulatory Visit | Attending: Physician Assistant | Admitting: Physician Assistant

## 2023-08-18 DIAGNOSIS — C3491 Malignant neoplasm of unspecified part of right bronchus or lung: Secondary | ICD-10-CM | POA: Diagnosis present

## 2023-08-18 MED ORDER — SODIUM CHLORIDE (PF) 0.9 % IJ SOLN
INTRAMUSCULAR | Status: AC
  Filled 2023-08-18: qty 50

## 2023-08-18 MED ORDER — IOHEXOL 300 MG/ML  SOLN
75.0000 mL | Freq: Once | INTRAMUSCULAR | Status: AC | PRN
Start: 2023-08-18 — End: 2023-08-18
  Administered 2023-08-18: 75 mL via INTRAVENOUS

## 2023-08-19 ENCOUNTER — Encounter: Payer: Self-pay | Admitting: Internal Medicine

## 2023-08-19 ENCOUNTER — Encounter: Payer: Self-pay | Admitting: Physician Assistant

## 2023-08-22 ENCOUNTER — Other Ambulatory Visit: Payer: Self-pay | Admitting: Physician Assistant

## 2023-08-22 ENCOUNTER — Telehealth: Payer: Self-pay | Admitting: Medical Oncology

## 2023-08-22 DIAGNOSIS — J189 Pneumonia, unspecified organism: Secondary | ICD-10-CM

## 2023-08-22 DIAGNOSIS — C3491 Malignant neoplasm of unspecified part of right bronchus or lung: Secondary | ICD-10-CM

## 2023-08-22 MED ORDER — DOXYCYCLINE HYCLATE 100 MG PO TABS: 100.0000 mg | ORAL_TABLET | Freq: Two times a day (BID) | ORAL | 0 refills | Status: DC

## 2023-08-22 NOTE — Telephone Encounter (Signed)
 Per Dr. Sherrod I told pt that she may have an inflammation and Doxycycline  was prescribed and sent to preferred pharmacy. I also instructed her to go to ED if symptoms don't improved.

## 2023-08-22 NOTE — Telephone Encounter (Signed)
 Patient reports experiencing severe, sharp pain localized to the right side, which is exacerbated by deep inspiration.   Onset of symptoms began approximately two weeks ago, prior to recent CT scan.   Per patient, pain initially started on the right lateral side, migrated to the area beneath the right breast, and is now primarily felt in the right axillary region. Patient describes the pain as intense, stating, "It takes my breath away." No additional symptoms reported at this time.  F/U appt on Thursday .

## 2023-08-23 ENCOUNTER — Telehealth: Payer: Self-pay | Admitting: *Deleted

## 2023-08-23 NOTE — Telephone Encounter (Signed)
 Copied from CRM [id number]. Topic: Clinical - Medical Advice >> Aug 22, 2023  1:48 PM Corean SAUNDERS wrote: Reason for CRM: Patient is requesting to speak with Dr. Shelah or his nurse as now that she received her lung CT scans she and is having issues with her right lung patient believes that her Albuterol  is making her symptoms worse. Please call patient back and advise.  I called and spoke with the pt  She states having some right side pain with deep inspiration- started 2-3 wks ago  Has cough in the am- clear sputum  No fever  She called oncology and has appt with Dr. Sherrod scheduled for tomorrow  She was unable to come in for appt here today  Had Chest CT done 08/18/23-Dr.  Heilingoetter and was just started on Doxycycline  yesterday  Routing to RB as FYI

## 2023-08-24 ENCOUNTER — Inpatient Hospital Stay (HOSPITAL_BASED_OUTPATIENT_CLINIC_OR_DEPARTMENT_OTHER): Admitting: Internal Medicine

## 2023-08-24 ENCOUNTER — Inpatient Hospital Stay

## 2023-08-24 VITALS — BP 111/66 | HR 111 | Temp 97.8°F | Resp 17 | Ht 62.0 in | Wt 99.0 lb

## 2023-08-24 DIAGNOSIS — C3491 Malignant neoplasm of unspecified part of right bronchus or lung: Secondary | ICD-10-CM

## 2023-08-24 DIAGNOSIS — Z5112 Encounter for antineoplastic immunotherapy: Secondary | ICD-10-CM | POA: Diagnosis not present

## 2023-08-24 DIAGNOSIS — J189 Pneumonia, unspecified organism: Secondary | ICD-10-CM

## 2023-08-24 DIAGNOSIS — Z95828 Presence of other vascular implants and grafts: Secondary | ICD-10-CM

## 2023-08-24 DIAGNOSIS — D701 Agranulocytosis secondary to cancer chemotherapy: Secondary | ICD-10-CM

## 2023-08-24 LAB — CMP (CANCER CENTER ONLY)
ALT: 26 U/L (ref 0–44)
AST: 32 U/L (ref 15–41)
Albumin: 3.2 g/dL — ABNORMAL LOW (ref 3.5–5.0)
Alkaline Phosphatase: 107 U/L (ref 38–126)
Anion gap: 8 (ref 5–15)
BUN: 16 mg/dL (ref 6–20)
CO2: 26 mmol/L (ref 22–32)
Calcium: 9.1 mg/dL (ref 8.9–10.3)
Chloride: 105 mmol/L (ref 98–111)
Creatinine: 1.12 mg/dL — ABNORMAL HIGH (ref 0.44–1.00)
GFR, Estimated: 57 mL/min — ABNORMAL LOW (ref >60)
Glucose, Bld: 121 mg/dL — ABNORMAL HIGH (ref 70–99)
Potassium: 3.7 mmol/L (ref 3.5–5.1)
Sodium: 139 mmol/L (ref 135–145)
Total Bilirubin: 0.3 mg/dL (ref 0.0–1.2)
Total Protein: 7.2 g/dL (ref 6.5–8.1)

## 2023-08-24 LAB — TSH: TSH: 7.05 u[IU]/mL — ABNORMAL HIGH (ref 0.350–4.500)

## 2023-08-24 LAB — CBC WITH DIFFERENTIAL (CANCER CENTER ONLY)
Abs Immature Granulocytes: 0.01 K/uL (ref 0.00–0.07)
Basophils Absolute: 0 K/uL (ref 0.0–0.1)
Basophils Relative: 0 %
Eosinophils Absolute: 0.1 K/uL (ref 0.0–0.5)
Eosinophils Relative: 2 %
HCT: 25.6 % — ABNORMAL LOW (ref 36.0–46.0)
Hemoglobin: 8.1 g/dL — ABNORMAL LOW (ref 12.0–15.0)
Immature Granulocytes: 0 %
Lymphocytes Relative: 19 %
Lymphs Abs: 0.7 K/uL (ref 0.7–4.0)
MCH: 34.3 pg — ABNORMAL HIGH (ref 26.0–34.0)
MCHC: 31.6 g/dL (ref 30.0–36.0)
MCV: 108.5 fL — ABNORMAL HIGH (ref 80.0–100.0)
Monocytes Absolute: 0.4 K/uL (ref 0.1–1.0)
Monocytes Relative: 13 %
Neutro Abs: 2.2 K/uL (ref 1.7–7.7)
Neutrophils Relative %: 66 %
Platelet Count: 268 K/uL (ref 150–400)
RBC: 2.36 MIL/uL — ABNORMAL LOW (ref 3.87–5.11)
RDW: 17 % — ABNORMAL HIGH (ref 11.5–15.5)
WBC Count: 3.4 K/uL — ABNORMAL LOW (ref 4.0–10.5)
nRBC: 0 % (ref 0.0–0.2)

## 2023-08-24 LAB — SAMPLE TO BLOOD BANK

## 2023-08-24 MED ORDER — METHYLPREDNISOLONE 4 MG PO TBPK: ORAL_TABLET | ORAL | 0 refills | Status: DC

## 2023-08-24 MED ORDER — DOXYCYCLINE HYCLATE 100 MG PO TABS: 100.0000 mg | ORAL_TABLET | Freq: Two times a day (BID) | ORAL | 0 refills | Status: DC

## 2023-08-24 MED ORDER — SODIUM CHLORIDE 0.9% FLUSH
10.0000 mL | Freq: Once | INTRAVENOUS | Status: AC
  Administered 2023-08-24: 10 mL

## 2023-08-24 NOTE — Progress Notes (Signed)
 [facility] [phone]   [phone]  OFFICE PROGRESS NOTE  Julie Nicholson, Abbasi, Julie Nicholson [address] [place] [address]  DIAGNOSIS: Recurrent/metastatic non-small cell lung cancer initially diagnosed as stage IIIB  (T1b, N3, M0) non-small cell lung cancer, adenocarcinoma she presented with right upper lobe nodule in addition to bulky right hilar, mediastinal, and left supraclavicular lymphadenopathy diagnosed in November 2022.  The patient had evidence for disease recurrence in the mediastinal and right supraclavicular lymphadenopathy in August 2023.  The patient had evidence of metastatic disease in February 2024 with several supraclavicular, thoracic and precarinal lymphadenopathy as well as upper abdominal lymph nodes and small liver lesion.  DETECTED ALTERATION(S) / BIOMARKER(S) % CFDNA OR AMPLIFICATION ASSOCIATED FDA-APPROVED THERAPIES CLINICAL TRIAL AVAILABILITY [id number] ND 0.5 5 50 100 4.7%  RHOAG17E ND 0.5 5 50 100 1.8%  [id number] ND 0.5 5 50 100 1.9%  BIOMARKER ADDITIONAL DETAILS Tumor Mutational Burden (TMB) 19.02 mut/Mb MSI Status Stable (MSS) PD-L1 Tumor Proportion Score (TPS)* <1%  Biomarker Findings By [facility] on 05/06/2022 Tumor Mutational Burden - 17 Muts/Mb Microsatellite status - MS-Stable Genomic Findings For a complete list of the genes assayed, please refer to the Appendix. CTNNB1 S37C CRKL amplification MAP2K4 loss exons 2-11 TP53 V143A 8 Disease relevant genes with no reportable alterations: ALK, BRAF, EGFR, ERBB2, KRAS, MET, RET, ROS1  PDL1 0%   PRIOR THERAPY:  1) Concurrent chemoradiation with carboplatin  for an AUC of 2 and paclitaxel  45 mg per metered squared.  First dose on 01/05/2021.  Status post 7 cycles of treatment.  Last dose was given February 16, 2021. 2) Consolidation immunotherapy with Imfinzi  1500 Mg IV every 4 weeks.  First dose March 25, 2021.  Status post 3 cycles.  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 3) Palliative radiotherapy to the enlarging right supraclavicular lymphadenopathy under the care of Dr. Shannon expected to be completed on October 28, 2021.   CURRENT THERAPY: Palliative systemic chemotherapy with carboplatin  for AUC of 5, Alimta  500 Mg/M2 and Keytruda  200 Mg IV every 3 weeks.  First dose April 05, 2022.  Status post 23  cycles.  Starting from cycle #3 her dose of carboplatin  was reduced to AUC of 4 and Alimta  400 Mg/M2.  Starting from cycle #5 the patient will be on maintenance treatment with Alimta  and Keytruda  every 3 weeks.  INTERVAL HISTORY: Julie Nicholson 58 y.o. female returns to the clinic today for follow-up visit.  Her daughter Julie Nicholson was available by phone during the visit.  Discussed the use of AI scribe software for clinical note transcription with the patient, who gave verbal consent to proceed.  History of Present Illness Julie Nicholson is a 58 year old female who presents for evaluation before starting cycle number twenty-four of her maintenance treatment.  She has completed twenty-three cycles of maintenance treatment and is here for evaluation before starting the next cycle. A recent CT scan of the neck and chest is being reviewed during this visit.  She experiences pain on the right side, primarily when taking a deep breath, describing it as a 'catching' sensation. This symptom began about a week before her recent CT scan. Additionally, she has a dry cough in the morning, occasionally producing clear sputum.  She has been experiencing weight loss, which she attributes to a poor appetite due to not feeling well. She has been on doxycycline  for two days, having taken four doses so far,  and is expected to continue for a total of two weeks.  There is a history of inflammation in her right lung, and she recalls being told about inflammation in the upper and lower parts of the right  lung on her recent CT scan. She mentions a partial collapse of her right lung.  Concerned about the weight loss. She is currently on doxycycline .    MEDICAL HISTORY: Past Medical History:  Diagnosis Date   Abdominal discomfort    Acute sinusitis 11/11/2021   Adenocarcinoma of right lung, stage 4 (HCC) 12/18/2020   Allergic rhinitis 05/08/2021   Antineoplastic chemotherapy induced pancytopenia (HCC) 02/23/2021   Aortic atherosclerosis (HCC) 06/24/2023   Cancer (HCC)    Chronic cholecystitis 03/13/2013   Chronic headaches    due to allergies, sinus   Chronic venous hypertension (idiopathic) with other complications of bilateral lower extremity    Constipation 06/23/2023   COPD (chronic obstructive pulmonary disease) (HCC)    per 2012 chest xray   pt states she doesn not have this now (04/10/2013)   COVID-19 virus infection 02/24/2021   Deviated nasal septum    Dyspnea 04/07/2021   Encounter for antineoplastic chemotherapy 12/18/2020   Encounter for antineoplastic immunotherapy 11/17/2022   Eustachian tube dysfunction    GERD (gastroesophageal reflux disease)    occasional uses Tums / Rolaids   Goals of care, counseling/discussion 03/18/2021   Hearing loss    right ear   HFrEF (heart failure with reduced ejection fraction) (HCC) 07/13/2023   High cholesterol    History of radiation therapy    right lung 01/07/2021-02/19/2021  Dr Lynwood Nasuti   History of radiation therapy    Lymph nodes of head,face,neck- 12/23/22-01/07/23- Dr. 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  MEDICATIONS:  Current Outpatient Medications  Medication Sig Dispense Refill   acetaminophen  (TYLENOL ) 500 MG tablet Take 1,000 mg by mouth every 6 (six) hours as needed for moderate pain.     albuterol  (PROVENTIL ) (2.5 MG/3ML) 0.083% nebulizer solution albuterol  sulfate 2.5 mg/3 mL (0.083 %) solution for nebulization  USE 1 VIAL IN NEBULIZER EVERY 6 HOURS AS NEEDED FOR WHEEZING OR SHORTNESS OF BREATH     albuterol  (VENTOLIN  HFA) 108 (90 Base) MCG/ACT inhaler Inhale 1-2 puffs into the lungs every 6 (six) hours as needed for wheezing or shortness of breath. 18 g 3   aspirin  EC 81 MG tablet Take 81 mg by mouth at bedtime.     atorvastatin  (LIPITOR) 40 MG tablet Take 40 mg by mouth at bedtime.     azelastine  (ASTELIN ) 0.1 % nasal spray Place 1 spray into both nostrils 2 (two) times daily as needed. Use in each nostril as directed 30 mL 5   benzonatate  (TESSALON ) 200  MG capsule Take 1 capsule (200 mg total) by mouth 3 (three) times daily as needed. 30 capsule 0   Biotin 5000 MCG TABS Take 5,000 mcg by mouth at bedtime.     buPROPion  (WELLBUTRIN  XL) 150 MG 24 hr tablet Take 150 mg by mouth at bedtime.     Ca Carbonate-Mag Hydroxide (ROLAIDS PO) Take 1 tablet by mouth daily as needed (heartburn).     doxycycline  (VIBRA -TABS) 100 MG tablet Take 1 tablet (100 mg total) by mouth 2 (two) times daily. 20 tablet 0   famotidine  (PEPCID ) 20 MG tablet Take 1 tablet by mouth twice daily 60 tablet 0   fluticasone  (FLONASE ) 50 MCG/ACT nasal spray Place 2 sprays into both nostrils daily. 16 g 5   folic acid  (FOLVITE ) 1 MG tablet Take 1 tablet by mouth once daily 30 tablet 0   furosemide (LASIX)  20 MG tablet Take 20 mg by mouth as needed for fluid or edema.     lidocaine -prilocaine  (EMLA ) cream APPLY TO PORT-A-CATH 30 TO 60 MINUTES BEFORE TREATMENT 30 g 0   linaCLOtide (LINZESS PO) Take 1 tablet by mouth daily.     loratadine  (CLARITIN ) 10 MG tablet Take 10 mg by mouth daily.     Multiple Vitamins-Minerals (HAIR SKIN AND NAILS FORMULA PO) Take 1 tablet by mouth daily.     ondansetron  (ZOFRAN -ODT) 8 MG disintegrating tablet DISSOLVE 1 TABLET IN MOUTH THREE TIMES DAILY AS NEEDED 20 tablet 0   Oxycodone  HCl 10 MG TABS Take 1 tablet by mouth 3 (three) times daily as needed (pain).     polyethylene glycol powder (MIRALAX ) 17 GM/SCOOP powder Take 17 g by mouth 2 (two) times daily as needed for mild constipation. 255 g 1   prochlorperazine  (COMPAZINE ) 10 MG tablet Take 1 tablet (10 mg total) by mouth every 6 (six) hours as needed. 30 tablet 2   rizatriptan (MAXALT-MLT) 10 MG disintegrating tablet Take 10 mg by mouth as needed for migraine. May repeat in 2 hours if needed     senna-docusate (SENOKOT-S) 8.6-50 MG tablet Take 2 tablets by mouth 2 (two) times daily between meals as needed for moderate constipation.     sertraline  (ZOLOFT ) 50 MG tablet Take 1 tablet by mouth daily.     Tiotropium Bromide-Olodaterol (STIOLTO RESPIMAT ) 2.5-2.5 MCG/ACT AERS Inhale 2 puffs into the lungs daily. 12 g 3   No current facility-administered medications for this visit.    SURGICAL HISTORY:  Past Surgical History:  Procedure Laterality Date   ABDOMINAL HYSTERECTOMY  1995   tx endometriosis, both ovaries removed   ADENOIDECTOMY     APPENDECTOMY  late 1990's   BIOPSY  02/26/2021   Procedure: BIOPSY;  Surgeon: Teressa Toribio SQUIBB, MD;  Location: [facility];  Service: Endoscopy;;   BRONCHIAL BRUSHINGS  12/15/2020   Procedure: BRONCHIAL BRUSHINGS;  Surgeon: Shelah Lamar RAMAN, MD;  Location: [facility] ENDOSCOPY;  Service: Cardiopulmonary;;   BRONCHIAL NEEDLE ASPIRATION BIOPSY  12/15/2020   Procedure: BRONCHIAL NEEDLE  ASPIRATION BIOPSIES;  Surgeon: Shelah Lamar RAMAN, MD;  Location: [facility]OSCOPY;  Service: Cardiopulmonary;;   CHOLECYSTECTOMY  04/10/2013   CHOLECYSTECTOMY N/A 04/10/2013   Procedure: LAPAROSCOPIC CHOLECYSTECTOMY WITH INTRAOPERATIVE CHOLANGIOGRAM;  Surgeon: Krystal JINNY Russell, MD;  Location: [facility] OR;  Service: General;  Laterality: N/A;   ELECTROMAGNETIC NAVIGATION BROCHOSCOPY  12/15/2020   Procedure: ELECTROMAGNETIC NAVIGATION BRONCHOSCOPY;  Surgeon: Shelah Lamar RAMAN, MD;  Location: [facility] ENDOSCOPY;  Service: Cardiopulmonary;;   ESOPHAGOGASTRODUODENOSCOPY (EGD) WITH PROPOFOL  N/A 02/26/2021   Procedure: ESOPHAGOGASTRODUODENOSCOPY (  EGD) WITH PROPOFOL ;  Surgeon: Teressa Toribio SQUIBB, MD;  Location: [facility];  Service: Endoscopy;  Laterality: N/A;   EUS N/A 02/16/2013   Procedure: UPPER ENDOSCOPIC ULTRASOUND (EUS) LINEAR;  Surgeon: Toribio SQUIBB Teressa, MD;  Location: [facility]COPY;  Service: Endoscopy;  Laterality: N/A;  radial linear   IR IMAGING GUIDED PORT INSERTION  06/30/2022   KNEE ARTHROSCOPY Right 1980's   cartilage OR   LAPAROSCOPIC ENDOMETRIOSIS FULGURATION  1980's   MYRINGOTOMY WITH TUBE PLACEMENT Right 07/13/2018   Procedure: MYRINGOTOMY WITH TUBE PLACEMENT;  Surgeon: Edda Mt, MD;  [facility];  Service: ENT;  Laterality: Right;   MYRINGOTOMY WITH TUBE PLACEMENT Right 06/04/2021   Procedure: MYRINGOTOMY WITH BUTTERFLY TUBE PLACEMENT;  Surgeon: Edda Mt, MD;  Location: [facility];  Service: ENT;  Laterality: Right;   NASOPHARYNGOSCOPY EUSTATION TUBE BALLOON DILATION Right 07/13/2018   Procedure: NASOPHARYNGOSCOPY EUSTATION TUBE BALLOON DILATION;  Surgeon: Edda Mt, MD;  Location: [facility];  Service: ENT;  Laterality: Right;   TONSILLECTOMY AND ADENOIDECTOMY  ~ 1980   adenoidectomy   TUBAL LIGATION  ~ 1987   TURBINATE REDUCTION Right 07/13/2018   Procedure: OUTFRACTURE TURBINATE;  Surgeon: Edda Mt, MD;  Location: [facility];   Service: ENT;  Laterality: Right;   TYMPANOSTOMY TUBE PLACEMENT     VIDEO BRONCHOSCOPY WITH ENDOBRONCHIAL ULTRASOUND N/A 12/15/2020   Procedure: ROBOTIC VIDEO BRONCHOSCOPY WITH ENDOBRONCHIAL ULTRASOUND;  Surgeon: Shelah Lamar RAMAN, MD;  Location: [facility] ENDOSCOPY;  Service: Cardiopulmonary;  Laterality: N/A;   WRIST SURGERY Left    w/plate    REVIEW OF SYSTEMS:  Constitutional: positive for fatigue and weight loss Eyes: negative Ears, nose, mouth, throat, and face: negative Respiratory: positive for cough, dyspnea on exertion, and pleurisy/chest pain Cardiovascular: negative Gastrointestinal: negative Genitourinary:negative Integument/breast: negative Hematologic/lymphatic: negative Musculoskeletal:negative Neurological: negative Behavioral/Psych: negative Endocrine: negative Allergic/Immunologic: negative   PHYSICAL EXAMINATION: General appearance: alert, cooperative, fatigued, and no distress Head: Normocephalic, without obvious abnormality, atraumatic Neck: moderate anterior cervical adenopathy, no JVD, supple, symmetrical, trachea midline, and thyroid  not enlarged, symmetric, no tenderness/mass/nodules Lymph nodes: Cervical, supraclavicular, and axillary nodes normal. Resp: clear to auscultation bilaterally Back: symmetric, no curvature. ROM normal. No CVA tenderness. Cardio: regular rate and rhythm, S1, S2 normal, no murmur, click, rub or gallop GI: soft, non-tender; bowel sounds normal; no masses,  no organomegaly Extremities: extremities normal, atraumatic, no cyanosis or edema Neurologic: Alert and oriented X 3, normal strength and tone. Normal symmetric reflexes. Normal coordination and gait  ECOG PERFORMANCE STATUS: 1 - Symptomatic but completely ambulatory  Blood pressure 111/66, pulse (!) 111, temperature 97.8 F (36.6 C), temperature source Temporal, resp. rate 17, height 5' 2 (1.575 m), weight 99 lb (44.9 kg), SpO2 96%.  LABORATORY DATA: Lab Results  Component Value  Date   WBC 3.4 (L) 08/24/2023   HGB 8.1 (L) 08/24/2023   HCT 25.6 (L) 08/24/2023   MCV 108.5 (H) 08/24/2023   PLT 268 08/24/2023      Chemistry      Component Value Date/Time   NA 139 08/24/2023 1058   K 3.7 08/24/2023 1058   CL 105 08/24/2023 1058   CO2 26 08/24/2023 1058   BUN 16 08/24/2023 1058   CREATININE 1.12 (H) 08/24/2023 1058      Component Value Date/Time   CALCIUM  9.1 08/24/2023 1058   ALKPHOS 107 08/24/2023 1058   AST 32 08/24/2023 1058   ALT 26 08/24/2023 1058   BILITOT 0.3 08/24/2023 1058  RADIOGRAPHIC STUDIES: CT Chest W Contrast Result Date: 08/20/2023 CLINICAL DATA:  Non-small cell lung cancer restaging EXAM: CT NECK WITH CONTRAST CT CHEST WITH CONTRAST TECHNIQUE: Multidetector CT imaging of the neck was performed during intravenous contrast administration. Multidetector CT imaging of the chest was performed during intravenous contrast administration. RADIATION DOSE REDUCTION: This exam was performed according to the departmental dose-optimization program which includes automated exposure control, adjustment of the mA and/or kV according to patient size and/or use of iterative reconstruction technique. CONTRAST:  75mL OMNIPAQUE  IOHEXOL  300 MG/ML  SOLN COMPARISON:  None Available. FINDINGS: CT NECK FINDINGS Skull base and limited brain: No acute fracture. No primary bone lesion or focal pathologic process. Soft tissues and lymph nodes: Unchanged treated left supraclavicular lymph node measuring 0.4 cm (series 2, image 65). No residual or recurrent lymphadenopathy in the neck. Paraspinous soft tissues and spinal canal: No prevertebral fluid or swelling. No visible canal hematoma. Cervical spine: Degenerative straightening of the normal cervical lordosis. Moderate multilevel cervical disc degenerative disease, worst from C4-C6. Other: Mucoid retention cysts in the maxillary sinuses. CT CHEST FINDINGS Cardiovascular: Right chest port catheter. Normal heart size.  Scattered left coronary artery calcifications no pericardial effusion. Mediastinum/Nodes: Unchanged treated mediastinal and right hilar soft tissue (series 2, image 67). No discretely enlarged mediastinal, hilar, or axillary lymph nodes. Thyroid  gland, trachea, and esophagus demonstrate no significant findings. Lungs/Pleura: Severe emphysema. Diffuse bilateral bronchial wall thickening. Very extensive new heterogeneous and consolidative airspace opacity throughout the dependent right lower lobe (series 6, image 74). Otherwise unchanged post treatment appearance of the perihilar and suprahilar right lung with dense radiation fibrosis and consolidation (series 6, image 47). Trace right pleural effusion. Upper Abdomen: No acute abnormality. Musculoskeletal: No chest wall abnormality. No acute osseous findings. IMPRESSION: 1. Very extensive new heterogeneous and consolidative airspace opacity throughout the dependent right lower lobe, consistent with infection or aspiration. 2. Otherwise unchanged post treatment appearance of the perihilar and suprahilar right lung with dense radiation fibrosis and consolidation. 3. Unchanged treated mediastinal and right hilar soft tissue. No discretely enlarged mediastinal, hilar, or axillary lymph nodes. 4. Unchanged treated left supraclavicular lymph node measuring 0.4 cm. No residual or recurrent lymphadenopathy in the neck. 5. Severe emphysema and diffuse bilateral bronchial wall thickening. 6. Coronary artery disease. Emphysema (ICD10-J43.9). Electronically Signed   By: Marolyn JONETTA Jaksch M.D.   On: 08/20/2023 12:35   CT Soft Tissue Neck W Contrast Result Date: 08/20/2023 CLINICAL DATA:  Non-small cell lung cancer restaging EXAM: CT NECK WITH CONTRAST CT CHEST WITH CONTRAST TECHNIQUE: Multidetector CT imaging of the neck was performed during intravenous contrast administration. Multidetector CT imaging of the chest was performed during intravenous contrast administration. RADIATION  DOSE REDUCTION: This exam was performed according to the departmental dose-optimization program which includes automated exposure control, adjustment of the mA and/or kV according to patient size and/or use of iterative reconstruction technique. CONTRAST:  75mL OMNIPAQUE  IOHEXOL  300 MG/ML  SOLN COMPARISON:  None Available. FINDINGS: CT NECK FINDINGS Skull base and limited brain: No acute fracture. No primary bone lesion or focal pathologic process. Soft tissues and lymph nodes: Unchanged treated left supraclavicular lymph node measuring 0.4 cm (series 2, image 65). No residual or recurrent lymphadenopathy in the neck. Paraspinous soft tissues and spinal canal: No prevertebral fluid or swelling. No visible canal hematoma. Cervical spine: Degenerative straightening of the normal cervical lordosis. Moderate multilevel cervical disc degenerative disease, worst from C4-C6. Other: Mucoid retention cysts in the maxillary sinuses. CT CHEST FINDINGS Cardiovascular: Right  chest port catheter. Normal heart size. Scattered left coronary artery calcifications no pericardial effusion. Mediastinum/Nodes: Unchanged treated mediastinal and right hilar soft tissue (series 2, image 67). No discretely enlarged mediastinal, hilar, or axillary lymph nodes. Thyroid  gland, trachea, and esophagus demonstrate no significant findings. Lungs/Pleura: Severe emphysema. Diffuse bilateral bronchial wall thickening. Very extensive new heterogeneous and consolidative airspace opacity throughout the dependent right lower lobe (series 6, image 74). Otherwise unchanged post treatment appearance of the perihilar and suprahilar right lung with dense radiation fibrosis and consolidation (series 6, image 47). Trace right pleural effusion. Upper Abdomen: No acute abnormality. Musculoskeletal: No chest wall abnormality. No acute osseous findings. IMPRESSION: 1. Very extensive new heterogeneous and consolidative airspace opacity throughout the dependent right  lower lobe, consistent with infection or aspiration. 2. Otherwise unchanged post treatment appearance of the perihilar and suprahilar right lung with dense radiation fibrosis and consolidation. 3. Unchanged treated mediastinal and right hilar soft tissue. No discretely enlarged mediastinal, hilar, or axillary lymph nodes. 4. Unchanged treated left supraclavicular lymph node measuring 0.4 cm. No residual or recurrent lymphadenopathy in the neck. 5. Severe emphysema and diffuse bilateral bronchial wall thickening. 6. Coronary artery disease. Emphysema (ICD10-J43.9). Electronically Signed   By: Marolyn JONETTA Jaksch M.D.   On: 08/20/2023 12:35   MYOCARDIAL PERFUSION/CT RAD READ Result Date: 07/31/2023 CLINICAL DATA:  This over-read does not include interpretation of cardiac or coronary anatomy or pathology. The cardiac SPECT CT interpretation by the cardiologist is attached. The CT imaging obtained is limited due to only obtained axial soft tissue imaging with no axial lung window reconstruction or multiplanar coronal or sagittal reconstructions. COMPARISON:  CT chest on 06/01/2023. FINDINGS: Vascular: No significant non-cardiac vascular findings. Indwelling Port-A-Cath. Mediastinum/Nodes: No incidental masses or lymphadenopathy. Lungs/Pleura: Stable perihilar fibrosis and scarring extending into the right upper lobe consistent with prior radiation therapy for lung carcinoma. New scattered airspace disease and ground-glass opacity in the right lower lobe since the prior CT which may be consistent with pneumonia. Correlation suggested with any clinical symptoms. No associated pleural fluid, overt pulmonary edema or pneumothorax. Upper Abdomen: Status post cholecystectomy. Musculoskeletal: Unremarkable visualized bony structures. IMPRESSION: 1. New scattered airspace disease and ground-glass opacity in the right lower lobe since the prior CT which may be consistent with pneumonia. Correlation suggested with any clinical  symptoms. 2. Stable perihilar fibrosis and scarring extending into the right upper lobe consistent with prior radiation therapy for lung carcinoma. Electronically Signed   By: Marcey Moan M.D.   On: 07/31/2023 10:51   MYOCARDIAL PERFUSION IMAGING Result Date: 07/29/2023   The study is normal. The study is low risk.   No ST deviation was noted.   LV perfusion is normal. There is no evidence of ischemia. There is no evidence of infarction.   Left ventricular function is normal. Nuclear stress EF: 71%. The left ventricular ejection fraction is hyperdynamic (>65%). End diastolic cavity size is normal. End systolic cavity size is normal.   CT images were obtained for attenuation correction and were examined for the presence of coronary calcium  when appropriate.   Coronary calcium  was present on the attenuation correction CT images. Mild coronary calcifications were present. Coronary calcifications were present in the left anterior descending artery distribution(s).   Prior study not available for comparison.   Electronically signed by Lonni Nanas, MD     ASSESSMENT AND PLAN: This is a very pleasant 58 years old white female with stage IIIB (T1b, N3, M0) non-small cell lung cancer, adenocarcinoma diagnosed in  November 2022 with no actionable mutation and negative PD-L1 expression. The patient completed a course of concurrent chemoradiation with weekly carboplatin  for AUC of 2 and paclitaxel  45 Mg/M2 status post 7 cycles.  She has been tolerating her treatment well except for the mild odynophagia and skin burns. She was also recently admitted to the hospital complaining of dysphagia and odynophagia secondary to radiation induced esophagitis.  She is feeling much better but continues to have residual dysphagia.  She is followed by gastroenterology and was seen by Dr. Teressa during her hospitalization. Her scan showed improvement of her disease. I recommended for the patient treatment with  consolidation immunotherapy with Imfinzi  1500 Mg IV every 4 weeks.  Status post 3 cycles.  Last dose was given in April 2023.  Her treatment was discontinued secondary to suspicious immunotherapy mediated pneumonitis with significant shortness of breath at that time and she was treated with a tapered dose of prednisone . Unfortunately her scan showed evidence for disease recurrence with enlargement of lower right cervical lymph nodes as well as mediastinal lymphadenopathy.  She has palpable right cervical lymphadenopathy. 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 The patient underwent ultrasound-guided core biopsy of the right supraclavicular lymph nodes but the final pathology showed necrotic tumor cells with complete coagulative necrosis with focal fibrous tissue and histiocytic reaction.  She also had MRI of the brain that showed no evidence of metastatic disease to the brain. The patient was seen by Dr. Shannon and started palliative radiotherapy to the enlarging right supraclavicular lymphadenopathy.  This was completed on October 28, 2021. The patient was found to have metastatic disease in February 2024 with several lymphadenopathy in the chest as well as supraclavicular, upper abdomen as well as suspicious small liver metastasis. 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  [facility] molecular studies. She is currently undergoing systemic chemotherapy with  carboplatin  for AUC of 5, Alimta  500 Mg/M2 and Keytruda  200 Mg IV every 3 weeks status post 23 cycles.  Starting from cycle #3, I reduced her dose of carboplatin  to AUC of 4 and Alimta  400 Mg/M2.  She had repeat CT scan of the chest and neck performed recently.  I personally and independently reviewed the scan images and discussed the results and showed the images to the patient and her daughter. Her scan showed no concerning findings for disease progression but there was very extensive new heterogeneous and consolidative airspace opacities throughout the dependent right lower lobe consistent with infection or aspiration. Assessment and Plan Assessment & Plan Inflammation of right lung Inflammation in the upper and lower parts of the right lung, including the hilar area, suspected to be pneumonia or pneumonitis. Causes pain on deep inspiration and is associated with a dry cough. Not indicative of cancer growth but concerning enough to delay cancer treatment cycle to prevent exacerbation. - Extend doxycycline  for a total of two weeks. - Initiate a course of prednisone . - Cancel the current cancer treatment cycle. - Re-evaluate in three weeks to assess improvement and determine if cancer treatment can be resumed.  Cancer treatment On maintenance cancer treatment for 23 cycles, due to start cycle 24. Treatment cycle delayed due to lung inflammation. CT scan did not show signs of metastasis or cancer progression, only inflammation. - Cancel the current cancer treatment cycle. - Re-evaluate in three weeks to assess improvement and determine if cancer treatment can be resumed. The patient was advised to call immediately if she has any concerning symptoms in the interval. The patient voices understanding of current disease status and treatment options and is in agreement with the current care plan.  All questions were answered. The patient knows to call the clinic with any problems, questions or  concerns. We can certainly see the patient much sooner if necessary.  The total time spent in the appointment was 30 minutes.  Disclaimer: This note was dictated with voice recognition software. Similar sounding words can inadvertently be transcribed and may not be corrected upon review.

## 2023-08-24 NOTE — Telephone Encounter (Signed)
 Agree with the doxycycline . Will follow her response

## 2023-08-25 ENCOUNTER — Telehealth: Payer: Self-pay

## 2023-08-25 LAB — T4: T4, Total: 9.3 ug/dL (ref 4.5–12.0)

## 2023-08-25 NOTE — Telephone Encounter (Signed)
 T/C from pt stating her mother has been admitted to a rehab facility and is currently being treated for possible Shingles.  Pt is asking if it is OK if she still can visit her Mother.   Please advise

## 2023-08-26 NOTE — Telephone Encounter (Signed)
LM for pt with recommendations.

## 2023-08-27 ENCOUNTER — Other Ambulatory Visit: Payer: Self-pay

## 2023-09-02 ENCOUNTER — Encounter: Payer: Self-pay | Admitting: Physician Assistant

## 2023-09-02 ENCOUNTER — Other Ambulatory Visit: Payer: Self-pay | Admitting: Physician Assistant

## 2023-09-02 DIAGNOSIS — E039 Hypothyroidism, unspecified: Secondary | ICD-10-CM

## 2023-09-02 MED ORDER — LEVOTHYROXINE SODIUM 25 MCG PO TABS: 25.0000 ug | ORAL_TABLET | Freq: Every day | ORAL | 2 refills | Status: DC

## 2023-09-07 ENCOUNTER — Encounter: Payer: Self-pay | Admitting: Internal Medicine

## 2023-09-07 ENCOUNTER — Encounter: Payer: Self-pay | Admitting: Physician Assistant

## 2023-09-07 ENCOUNTER — Ambulatory Visit: Admitting: Physician Assistant

## 2023-09-07 ENCOUNTER — Telehealth: Payer: Self-pay | Admitting: Medical Oncology

## 2023-09-07 ENCOUNTER — Other Ambulatory Visit

## 2023-09-07 ENCOUNTER — Ambulatory Visit

## 2023-09-07 NOTE — Telephone Encounter (Signed)
 Patient reports that her previous chest pain  subsided after completing the Medrol  Dose Pak. However, she last night she experienced a recurrence of the pain. She also has a new, dry non-productive cough . Symptoms are not interfering with her activities of daily living (ADLs). She denies fever or other symptoms.  Patient is still on Doxycycline  (10-day course). Reports that the chest pain occurs only when attempting to take a deep breath.

## 2023-09-07 NOTE — Telephone Encounter (Signed)
 Per Dr. Sherrod I told Julie Nicholson to continue to monitor her pain for now and if it gets worse to go to the emergency room to make sure she does not have any blood clot.  Pt voiced understanding.

## 2023-09-14 ENCOUNTER — Inpatient Hospital Stay

## 2023-09-14 ENCOUNTER — Inpatient Hospital Stay: Attending: Internal Medicine

## 2023-09-14 ENCOUNTER — Inpatient Hospital Stay: Admitting: Internal Medicine

## 2023-09-14 VITALS — BP 116/69 | HR 105 | Temp 97.7°F | Resp 17 | Ht 62.0 in | Wt 96.9 lb

## 2023-09-14 DIAGNOSIS — Z5112 Encounter for antineoplastic immunotherapy: Secondary | ICD-10-CM | POA: Diagnosis present

## 2023-09-14 DIAGNOSIS — C770 Secondary and unspecified malignant neoplasm of lymph nodes of head, face and neck: Secondary | ICD-10-CM | POA: Insufficient documentation

## 2023-09-14 DIAGNOSIS — C3491 Malignant neoplasm of unspecified part of right bronchus or lung: Secondary | ICD-10-CM

## 2023-09-14 DIAGNOSIS — C3411 Malignant neoplasm of upper lobe, right bronchus or lung: Secondary | ICD-10-CM | POA: Insufficient documentation

## 2023-09-14 DIAGNOSIS — Z5111 Encounter for antineoplastic chemotherapy: Secondary | ICD-10-CM | POA: Diagnosis present

## 2023-09-14 DIAGNOSIS — R634 Abnormal weight loss: Secondary | ICD-10-CM | POA: Diagnosis not present

## 2023-09-14 DIAGNOSIS — D701 Agranulocytosis secondary to cancer chemotherapy: Secondary | ICD-10-CM

## 2023-09-14 DIAGNOSIS — Z95828 Presence of other vascular implants and grafts: Secondary | ICD-10-CM

## 2023-09-14 DIAGNOSIS — Z79620 Long term (current) use of immunosuppressive biologic: Secondary | ICD-10-CM | POA: Diagnosis not present

## 2023-09-14 DIAGNOSIS — T451X5A Adverse effect of antineoplastic and immunosuppressive drugs, initial encounter: Secondary | ICD-10-CM

## 2023-09-14 LAB — CMP (CANCER CENTER ONLY)
ALT: 16 U/L (ref 0–44)
AST: 25 U/L (ref 15–41)
Albumin: 3.5 g/dL (ref 3.5–5.0)
Alkaline Phosphatase: 113 U/L (ref 38–126)
Anion gap: 7 (ref 5–15)
BUN: 12 mg/dL (ref 6–20)
CO2: 29 mmol/L (ref 22–32)
Calcium: 9.4 mg/dL (ref 8.9–10.3)
Chloride: 103 mmol/L (ref 98–111)
Creatinine: 0.93 mg/dL (ref 0.44–1.00)
GFR, Estimated: >60 mL/min (ref >60)
Glucose, Bld: 131 mg/dL — ABNORMAL HIGH (ref 70–99)
Potassium: 3.3 mmol/L — ABNORMAL LOW (ref 3.5–5.1)
Sodium: 139 mmol/L (ref 135–145)
Total Bilirubin: 0.5 mg/dL (ref 0.0–1.2)
Total Protein: 7 g/dL (ref 6.5–8.1)

## 2023-09-14 LAB — CBC WITH DIFFERENTIAL (CANCER CENTER ONLY)
Abs Immature Granulocytes: 0 K/uL (ref 0.00–0.07)
Basophils Absolute: 0 K/uL (ref 0.0–0.1)
Basophils Relative: 0 %
Eosinophils Absolute: 0.1 K/uL (ref 0.0–0.5)
Eosinophils Relative: 4 %
HCT: 31.1 % — ABNORMAL LOW (ref 36.0–46.0)
Hemoglobin: 10.5 g/dL — ABNORMAL LOW (ref 12.0–15.0)
Immature Granulocytes: 0 %
Lymphocytes Relative: 28 %
Lymphs Abs: 0.8 K/uL (ref 0.7–4.0)
MCH: 34 pg (ref 26.0–34.0)
MCHC: 33.8 g/dL (ref 30.0–36.0)
MCV: 100.6 fL — ABNORMAL HIGH (ref 80.0–100.0)
Monocytes Absolute: 0.4 K/uL (ref 0.1–1.0)
Monocytes Relative: 16 %
Neutro Abs: 1.4 K/uL — ABNORMAL LOW (ref 1.7–7.7)
Neutrophils Relative %: 52 %
Platelet Count: 170 K/uL (ref 150–400)
RBC: 3.09 MIL/uL — ABNORMAL LOW (ref 3.87–5.11)
RDW: 13.5 % (ref 11.5–15.5)
WBC Count: 2.8 K/uL — ABNORMAL LOW (ref 4.0–10.5)
nRBC: 0 % (ref 0.0–0.2)

## 2023-09-14 LAB — TSH: TSH: 3.84 u[IU]/mL (ref 0.350–4.500)

## 2023-09-14 LAB — SAMPLE TO BLOOD BANK

## 2023-09-14 MED ORDER — CYANOCOBALAMIN 1000 MCG/ML IJ SOLN
1000.0000 ug | Freq: Once | INTRAMUSCULAR | Status: AC
  Administered 2023-09-14 (×2): 1000 ug via INTRAMUSCULAR

## 2023-09-14 MED ORDER — SODIUM CHLORIDE 0.9% FLUSH
10.0000 mL | Freq: Once | INTRAVENOUS | Status: AC
Start: 2023-09-14 — End: 2023-09-14
  Administered 2023-09-14 (×2): 10 mL

## 2023-09-14 MED ORDER — SODIUM CHLORIDE 0.9 % IV SOLN
400.0000 mg/m2 | Freq: Once | INTRAVENOUS | Status: AC
  Administered 2023-09-14 (×2): 600 mg via INTRAVENOUS
  Filled 2023-09-14: qty 20

## 2023-09-14 MED ORDER — SODIUM CHLORIDE 0.9 % IV SOLN
200.0000 mg | Freq: Once | INTRAVENOUS | Status: AC
  Administered 2023-09-14 (×2): 200 mg via INTRAVENOUS
  Filled 2023-09-14: qty 200

## 2023-09-14 MED ORDER — SODIUM CHLORIDE 0.9 % IV SOLN
Freq: Once | INTRAVENOUS | Status: AC
Start: 2023-09-14 — End: 2023-09-14

## 2023-09-14 MED ORDER — ONDANSETRON HCL 4 MG/2ML IJ SOLN
8.0000 mg | Freq: Once | INTRAMUSCULAR | Status: AC
  Administered 2023-09-14 (×2): 8 mg via INTRAVENOUS

## 2023-09-14 NOTE — Patient Instructions (Signed)
 [facility] - A DEPT OF [facility]  Discharge Instructions: Thank you for choosing [facility] to provide your oncology and hematology care.   If you have a lab appointment with the Cancer Center, please go directly to the Cancer Center and check in at the registration area.   Wear comfortable clothing and clothing appropriate for easy access to any Portacath or PICC line.   We strive to give you quality time with your provider. You may need to reschedule your appointment if you arrive late (15 or more minutes).  Arriving late affects you and other patients whose appointments are after yours.  Also, if you miss three or more appointments without notifying the office, you may be dismissed from the clinic at the provider's discretion.      For prescription refill requests, have your pharmacy contact our office and allow 72 hours for refills to be completed.    Today you received the following chemotherapy and/or immunotherapy agents: Keytruda, Alimta.       To help prevent nausea and vomiting after your treatment, we encourage you to take your nausea medication as directed.  BELOW ARE SYMPTOMS THAT SHOULD BE REPORTED IMMEDIATELY: *FEVER GREATER THAN 100.4 F (38 C) OR HIGHER *CHILLS OR SWEATING *NAUSEA AND VOMITING THAT IS NOT CONTROLLED WITH YOUR NAUSEA MEDICATION *UNUSUAL SHORTNESS OF BREATH *UNUSUAL BRUISING OR BLEEDING *URINARY PROBLEMS (pain or burning when urinating, or frequent urination) *BOWEL PROBLEMS (unusual diarrhea, constipation, pain near the anus) TENDERNESS IN MOUTH AND THROAT WITH OR WITHOUT PRESENCE OF ULCERS (sore throat, sores in mouth, or a toothache) UNUSUAL RASH, SWELLING OR PAIN  UNUSUAL VAGINAL DISCHARGE OR ITCHING   Items with * indicate a potential emergency and should be followed up as soon as possible or go to the Emergency Department if any problems should occur.  Please show the CHEMOTHERAPY ALERT CARD or  IMMUNOTHERAPY ALERT CARD at check-in to the Emergency Department and triage nurse.  Should you have questions after your visit or need to cancel or reschedule your appointment, please contact [facility] - A DEPT OF [facility]  Dept: [phone]  and follow the prompts.  Office hours are 8:00 a.m. to 4:30 p.m. Monday - Friday. Please note that voicemails left after 4:00 p.m. may not be returned until the following business day.  We are closed weekends and major holidays. You have access to a nurse at all times for urgent questions. Please call the main number to the clinic Dept: [phone] and follow the prompts.   For any non-urgent questions, you may also contact your provider using MyChart. We now offer e-Visits for anyone 61 and older to request care online for non-urgent symptoms. For details visit [email or web address].   Also download the MyChart app! Go to the app store, search "MyChart", open the app, select , and log in with your MyChart username and password.

## 2023-09-14 NOTE — Progress Notes (Signed)
 [facility] [phone]   [phone]  OFFICE PROGRESS NOTE  Julie Nicholson, [place], Julie Nicholson [address] [place] [address]  DIAGNOSIS: Recurrent/metastatic non-small cell lung cancer initially diagnosed as stage IIIB  (T1b, N3, M0) non-small cell lung cancer, adenocarcinoma she presented with right upper lobe nodule in addition to bulky right hilar, mediastinal, and left supraclavicular lymphadenopathy diagnosed in November 2022.  The patient had evidence for disease recurrence in the mediastinal and right supraclavicular lymphadenopathy in August 2023.  The patient had evidence of metastatic disease in February 2024 with several supraclavicular, thoracic and precarinal lymphadenopathy as well as upper abdominal lymph nodes and small liver lesion.  DETECTED ALTERATION(S) / BIOMARKER(S) % CFDNA OR AMPLIFICATION ASSOCIATED FDA-APPROVED THERAPIES CLINICAL TRIAL AVAILABILITY [id number] ND 0.5 5 50 100 4.7%  RHOAG17E ND 0.5 5 50 100 1.8%  CTNNB1S37C ND 0.5 5 50 100 1.9%  BIOMARKER ADDITIONAL DETAILS Tumor Mutational Burden (TMB) 19.02 mut/Mb MSI Status Stable (MSS) PD-L1 Tumor Proportion Score (TPS)* <1%  Biomarker Findings By [facility] on 05/06/2022 Tumor Mutational Burden - 17 Muts/Mb Microsatellite status - MS-Stable Genomic Findings For a complete list of the genes assayed, please refer to the Appendix. CTNNB1 S37C CRKL amplification MAP2K4 loss exons 2-11 TP53 V143A 8 Disease relevant genes with no reportable alterations: ALK, BRAF, EGFR, ERBB2, KRAS, MET, RET, ROS1  PDL1 0%   PRIOR THERAPY:  1) Concurrent chemoradiation with carboplatin  for an AUC of 2 and paclitaxel  45 mg per metered squared.  First dose on 01/05/2021.  Status post 7 cycles of treatment.  Last dose was given February 16, 2021. 2) Consolidation immunotherapy with Imfinzi  1500 Mg IV every 4 weeks.  First dose March 25, 2021.  Status post 3 cycles.  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 3) Palliative radiotherapy to the enlarging right supraclavicular lymphadenopathy under the care of Dr. Shannon expected to be completed on October 28, 2021.   CURRENT THERAPY: Palliative systemic chemotherapy with carboplatin  for AUC of 5, Alimta  500 Mg/M2 and Keytruda  200 Mg IV every 3 weeks.  First dose April 05, 2022.  Status post 23  cycles.  Starting from cycle #3 her dose of carboplatin  was reduced to AUC of 4 and Alimta  400 Mg/M2.  Starting from cycle #5 the patient will be on maintenance treatment with Alimta  and Keytruda  every 3 weeks.  INTERVAL HISTORY: Julie Nicholson 58 y.o. female returns to the clinic today for follow-up visit.  Discussed the use of AI scribe software for clinical note transcription with the patient, who gave verbal consent to proceed.  History of Present Illness Julie Nicholson is a 58 year old female with recurrent and metastatic non-small cell lung cancer who presents for evaluation before resuming treatment with cycle number twenty-four.  Initially diagnosed with stage 3B non-small cell lung cancer in November 2022, she experienced disease progression and metastasis in August 2023 and February 2024. She is currently preparing to resume treatment with cycle number twenty-four.  Over the past three weeks, she has felt better, although she experienced transient pain under her right arm that resolved after a day. No current swelling in that area is noted.  She reports that her appetite is not as off as it used to be. No nausea, vomiting, or diarrhea. She reports headaches, which she attributes to weather changes. No swollen glands.  Despite being off treatment for three weeks, she has lost weight and is currently down  to 96 pounds.    MEDICAL HISTORY: Past Medical History:  Diagnosis Date   Abdominal discomfort    Acute sinusitis 11/11/2021   Adenocarcinoma of right lung, stage 4  (HCC) 12/18/2020   Allergic rhinitis 05/08/2021   Antineoplastic chemotherapy induced pancytopenia (HCC) 02/23/2021   Aortic atherosclerosis (HCC) 06/24/2023   Cancer (HCC)    Chronic cholecystitis 03/13/2013   Chronic headaches    due to allergies, sinus   Chronic venous hypertension (idiopathic) with other complications of bilateral lower extremity    Constipation 06/23/2023   COPD (chronic obstructive pulmonary disease) (HCC)    per 2012 chest xray   pt states she doesn not have this now (04/10/2013)   COVID-19 virus infection 02/24/2021   Deviated nasal septum    Dyspnea 04/07/2021   Encounter for antineoplastic chemotherapy 12/18/2020   Encounter for antineoplastic immunotherapy 11/17/2022   Eustachian tube dysfunction    GERD (gastroesophageal reflux disease)    occasional uses Tums / Rolaids   Goals of care, counseling/discussion 03/18/2021   Hearing loss    right ear   HFrEF (heart failure with reduced ejection fraction) (HCC) 07/13/2023   High cholesterol    History of radiation therapy    right lung 01/07/2021-02/19/2021  Dr Lynwood Nasuti   History of radiation therapy    Lymph nodes of head,face,neck- 12/23/22-01/07/23- Dr. 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  MEDICATIONS:  Current Outpatient Medications  Medication Sig Dispense Refill   acetaminophen  (TYLENOL ) 500 MG tablet Take 1,000 mg by mouth every 6 (six) hours as needed for moderate pain.     albuterol  (PROVENTIL ) (2.5 MG/3ML) 0.083% nebulizer solution albuterol  sulfate 2.5 mg/3 mL (0.083 %) solution for nebulization  USE 1 VIAL IN NEBULIZER EVERY 6 HOURS AS NEEDED FOR WHEEZING OR SHORTNESS OF BREATH     albuterol  (VENTOLIN  HFA) 108 (90 Base) MCG/ACT inhaler Inhale 1-2 puffs into the lungs every 6 (six) hours as needed for wheezing or shortness of breath. 18 g 3   aspirin  EC 81 MG tablet Take 81 mg by mouth at bedtime.     atorvastatin  (LIPITOR) 40 MG tablet Take 40 mg by mouth at bedtime.     azelastine  (ASTELIN ) 0.1 % nasal spray Place 1 spray into both nostrils 2 (two) times daily as needed. Use in each nostril as directed 30 mL 5   benzonatate  (TESSALON ) 200 MG capsule Take 1 capsule (200 mg total) by mouth 3 (three) times daily as needed. 30 capsule 0   Biotin 5000 MCG TABS Take 5,000 mcg by mouth at bedtime.     buPROPion  (WELLBUTRIN  XL) 150 MG 24 hr tablet Take 150 mg by mouth at bedtime.     Ca Carbonate-Mag Hydroxide (ROLAIDS PO) Take  1 tablet by mouth daily as needed (heartburn).     doxycycline  (VIBRA -TABS) 100 MG tablet Take 1 tablet (100 mg total) by mouth 2 (two) times daily. 30 tablet 0   famotidine  (PEPCID ) 20 MG tablet Take 1 tablet by mouth twice daily 60 tablet 0   fluticasone  (FLONASE ) 50 MCG/ACT nasal spray Place 2 sprays into both nostrils daily. 16 g 5   folic acid  (FOLVITE ) 1 MG tablet Take 1 tablet by mouth once daily 30 tablet 0   furosemide  (LASIX ) 20 MG tablet Take 20 mg by mouth as needed for fluid or edema.     levothyroxine  (SYNTHROID ) 25 MCG tablet Take 1 tablet (25 mcg total) by mouth daily before breakfast. 30 tablet 2   lidocaine -prilocaine  (EMLA ) cream APPLY TO PORT-A-CATH 30 TO 60 MINUTES BEFORE TREATMENT 30 g 0   linaCLOtide (LINZESS PO)  Take 1 tablet by mouth daily.     loratadine  (CLARITIN ) 10 MG tablet Take 10 mg by mouth daily.     methylPREDNISolone  (MEDROL  DOSEPAK) 4 MG TBPK tablet Use as instructed 21 tablet 0   Multiple Vitamins-Minerals (HAIR SKIN AND NAILS FORMULA PO) Take 1 tablet by mouth daily.     ondansetron  (ZOFRAN -ODT) 8 MG disintegrating tablet DISSOLVE 1 TABLET IN MOUTH THREE TIMES DAILY AS NEEDED 20 tablet 0   Oxycodone  HCl 10 MG TABS Take 1 tablet by mouth 3 (three) times daily as needed (pain).     polyethylene glycol powder (MIRALAX ) 17 GM/SCOOP powder Take 17 g by mouth 2 (two) times daily as needed for mild constipation. 255 g 1   prochlorperazine  (COMPAZINE ) 10 MG tablet Take 1 tablet (10 mg total) by mouth every 6 (six) hours as needed. 30 tablet 2   rizatriptan (MAXALT-MLT) 10 MG disintegrating tablet Take 10 mg by mouth as needed for migraine. May repeat in 2 hours if needed     senna-docusate (SENOKOT-S) 8.6-50 MG tablet Take 2 tablets by mouth 2 (two) times daily between meals as needed for moderate constipation.     sertraline  (ZOLOFT ) 50 MG tablet Take 1 tablet by mouth daily.     Tiotropium Bromide-Olodaterol (STIOLTO RESPIMAT ) 2.5-2.5 MCG/ACT AERS Inhale 2 puffs into the lungs daily. 12 g 3   No current facility-administered medications for this visit.    SURGICAL HISTORY:  Past Surgical History:  Procedure Laterality Date   ABDOMINAL HYSTERECTOMY  1995   tx endometriosis, both ovaries removed   ADENOIDECTOMY     APPENDECTOMY  late 1990's   BIOPSY  02/26/2021   Procedure: BIOPSY;  Surgeon: Teressa Toribio SQUIBB, MD;  Location: [facility];  Service: Endoscopy;;   BRONCHIAL BRUSHINGS  12/15/2020   Procedure: BRONCHIAL BRUSHINGS;  Surgeon: Shelah Lamar RAMAN, MD;  Location: [facility] ENDOSCOPY;  Service: Cardiopulmonary;;   BRONCHIAL NEEDLE ASPIRATION BIOPSY  12/15/2020   Procedure: BRONCHIAL NEEDLE ASPIRATION BIOPSIES;  Surgeon: Shelah Lamar RAMAN, MD;  Location: [facility] ENDOSCOPY;  Service: Cardiopulmonary;;    CHOLECYSTECTOMY  04/10/2013   CHOLECYSTECTOMY N/A 04/10/2013   Procedure: LAPAROSCOPIC CHOLECYSTECTOMY WITH INTRAOPERATIVE CHOLANGIOGRAM;  Surgeon: Krystal JINNY Russell, MD;  Location: [facility] OR;  Service: General;  Laterality: N/A;   ELECTROMAGNETIC NAVIGATION BROCHOSCOPY  12/15/2020   Procedure: ELECTROMAGNETIC NAVIGATION BRONCHOSCOPY;  Surgeon: Shelah Lamar RAMAN, MD;  Location[facility] ENDOSCOPY;  Service: Cardiopulmonary;;   ESOPHAGOGASTRODUODENOSCOPY (EGD) WITH PROPOFOL  N/A 02/26/2021   Procedure: ESOPHAGOGASTRODUODENOSCOPY (EGD) WITH PROPOFOL ;  Surgeon: Teressa Toribio SQUIBB, MD;  Location: [facility];  Service: Endoscopy;  Laterality: N/A;   EUS N/A 02/16/2013  Procedure: UPPER ENDOSCOPIC ULTRASOUND (EUS) LINEAR;  Surgeon: Toribio SHAUNNA Cedar, MD;  Location: [facility];  Service: Endoscopy;  Laterality: N/A;  radial linear   IR IMAGING GUIDED PORT INSERTION  06/30/2022   KNEE ARTHROSCOPY Right 1980's   cartilage OR   LAPAROSCOPIC ENDOMETRIOSIS FULGURATION  1980's   MYRINGOTOMY WITH TUBE PLACEMENT Right 07/13/2018   Procedure: MYRINGOTOMY WITH TUBE PLACEMENT;  Surgeon: Edda Mt, MD;  Location: [facility];  Service: ENT;  Laterality: Right;   MYRINGOTOMY WITH TUBE PLACEMENT Right 06/04/2021   Procedure: MYRINGOTOMY WITH BUTTERFLY TUBE PLACEMENT;  Surgeon: Edda Mt, MD;  Location: [facility];  Service: ENT;  Laterality: Right;   NASOPHARYNGOSCOPY EUSTATION TUBE BALLOON DILATION Right 07/13/2018   Procedure: NASOPHARYNGOSCOPY EUSTATION TUBE BALLOON DILATION;  Surgeon: Edda Mt, MD;  Location: [facility];  Service: ENT;  Laterality: Right;   TONSILLECTOMY AND ADENOIDECTOMY  ~ 1980   adenoidectomy   TUBAL LIGATION  ~ 1987   TURBINATE REDUCTION Right 07/13/2018   Procedure: OUTFRACTURE TURBINATE;  Surgeon: Edda Mt, MD;  Location: [facility];  Service: ENT;  Laterality: Right;   TYMPANOSTOMY TUBE PLACEMENT     VIDEO BRONCHOSCOPY WITH ENDOBRONCHIAL  ULTRASOUND N/A 12/15/2020   Procedure: ROBOTIC VIDEO BRONCHOSCOPY WITH ENDOBRONCHIAL ULTRASOUND;  Surgeon: Shelah Lamar RAMAN, MD;  Location: [facility] ENDOSCOPY;  Service: Cardiopulmonary;  Laterality: N/A;   WRIST SURGERY Left    w/plate    REVIEW OF SYSTEMS:  Constitutional: positive for fatigue and weight loss Eyes: negative Ears, nose, mouth, throat, and face: negative Respiratory: positive for dyspnea on exertion and pleurisy/chest pain Cardiovascular: negative Gastrointestinal: negative Genitourinary:negative Integument/breast: negative Hematologic/lymphatic: negative Musculoskeletal:negative Neurological: negative Behavioral/Psych: negative Endocrine: negative Allergic/Immunologic: negative   PHYSICAL EXAMINATION: General appearance: alert, cooperative, fatigued, and no distress Head: Normocephalic, without obvious abnormality, atraumatic Neck: moderate anterior cervical adenopathy, no JVD, supple, symmetrical, trachea midline, and thyroid  not enlarged, symmetric, no tenderness/mass/nodules Lymph nodes: Cervical, supraclavicular, and axillary nodes normal. Resp: clear to auscultation bilaterally Back: symmetric, no curvature. ROM normal. No CVA tenderness. Cardio: regular rate and rhythm, S1, S2 normal, no murmur, click, rub or gallop GI: soft, non-tender; bowel sounds normal; no masses,  no organomegaly Extremities: extremities normal, atraumatic, no cyanosis or edema Neurologic: Alert and oriented X 3, normal strength and tone. Normal symmetric reflexes. Normal coordination and gait  ECOG PERFORMANCE STATUS: 1 - Symptomatic but completely ambulatory  Blood pressure 116/69, pulse (!) 105, temperature 97.7 F (36.5 C), temperature source Temporal, resp. rate 17, height 5' 2 (1.575 m), weight 96 lb 14.4 oz (44 kg), SpO2 98%.  LABORATORY DATA: Lab Results  Component Value Date   WBC 2.8 (L) 09/14/2023   HGB 10.5 (L) 09/14/2023   HCT 31.1 (L) 09/14/2023   MCV 100.6 (H)  09/14/2023   PLT 170 09/14/2023      Chemistry      Component Value Date/Time   NA 139 08/24/2023 1058   K 3.7 08/24/2023 1058   CL 105 08/24/2023 1058   CO2 26 08/24/2023 1058   BUN 16 08/24/2023 1058   CREATININE 1.12 (H) 08/24/2023 1058      Component Value Date/Time   CALCIUM  9.1 08/24/2023 1058   ALKPHOS 107 08/24/2023 1058   AST 32 08/24/2023 1058   ALT 26 08/24/2023 1058   BILITOT 0.3 08/24/2023 1058       RADIOGRAPHIC STUDIES: CT Chest W Contrast Result Date: 08/20/2023 CLINICAL DATA:  Non-small cell lung cancer restaging EXAM: CT NECK WITH CONTRAST CT  CHEST WITH CONTRAST TECHNIQUE: Multidetector CT imaging of the neck was performed during intravenous contrast administration. Multidetector CT imaging of the chest was performed during intravenous contrast administration. RADIATION DOSE REDUCTION: This exam was performed according to the departmental dose-optimization program which includes automated exposure control, adjustment of the mA and/or kV according to patient size and/or use of iterative reconstruction technique. CONTRAST:  75mL OMNIPAQUE  IOHEXOL  300 MG/ML  SOLN COMPARISON:  None Available. FINDINGS: CT NECK FINDINGS Skull base and limited brain: No acute fracture. No primary bone lesion or focal pathologic process. Soft tissues and lymph nodes: Unchanged treated left supraclavicular lymph node measuring 0.4 cm (series 2, image 65). No residual or recurrent lymphadenopathy in the neck. Paraspinous soft tissues and spinal canal: No prevertebral fluid or swelling. No visible canal hematoma. Cervical spine: Degenerative straightening of the normal cervical lordosis. Moderate multilevel cervical disc degenerative disease, worst from C4-C6. Other: Mucoid retention cysts in the maxillary sinuses. CT CHEST FINDINGS Cardiovascular: Right chest port catheter. Normal heart size. Scattered left coronary artery calcifications no pericardial effusion. Mediastinum/Nodes: Unchanged treated  mediastinal and right hilar soft tissue (series 2, image 67). No discretely enlarged mediastinal, hilar, or axillary lymph nodes. Thyroid  gland, trachea, and esophagus demonstrate no significant findings. Lungs/Pleura: Severe emphysema. Diffuse bilateral bronchial wall thickening. Very extensive new heterogeneous and consolidative airspace opacity throughout the dependent right lower lobe (series 6, image 74). Otherwise unchanged post treatment appearance of the perihilar and suprahilar right lung with dense radiation fibrosis and consolidation (series 6, image 47). Trace right pleural effusion. Upper Abdomen: No acute abnormality. Musculoskeletal: No chest wall abnormality. No acute osseous findings. IMPRESSION: 1. Very extensive new heterogeneous and consolidative airspace opacity throughout the dependent right lower lobe, consistent with infection or aspiration. 2. Otherwise unchanged post treatment appearance of the perihilar and suprahilar right lung with dense radiation fibrosis and consolidation. 3. Unchanged treated mediastinal and right hilar soft tissue. No discretely enlarged mediastinal, hilar, or axillary lymph nodes. 4. Unchanged treated left supraclavicular lymph node measuring 0.4 cm. No residual or recurrent lymphadenopathy in the neck. 5. Severe emphysema and diffuse bilateral bronchial wall thickening. 6. Coronary artery disease. Emphysema (ICD10-J43.9). Electronically Signed   By: Marolyn JONETTA Jaksch M.D.   On: 08/20/2023 12:35   CT Soft Tissue Neck W Contrast Result Date: 08/20/2023 CLINICAL DATA:  Non-small cell lung cancer restaging EXAM: CT NECK WITH CONTRAST CT CHEST WITH CONTRAST TECHNIQUE: Multidetector CT imaging of the neck was performed during intravenous contrast administration. Multidetector CT imaging of the chest was performed during intravenous contrast administration. RADIATION DOSE REDUCTION: This exam was performed according to the departmental dose-optimization program which  includes automated exposure control, adjustment of the mA and/or kV according to patient size and/or use of iterative reconstruction technique. CONTRAST:  75mL OMNIPAQUE  IOHEXOL  300 MG/ML  SOLN COMPARISON:  None Available. FINDINGS: CT NECK FINDINGS Skull base and limited brain: No acute fracture. No primary bone lesion or focal pathologic process. Soft tissues and lymph nodes: Unchanged treated left supraclavicular lymph node measuring 0.4 cm (series 2, image 65). No residual or recurrent lymphadenopathy in the neck. Paraspinous soft tissues and spinal canal: No prevertebral fluid or swelling. No visible canal hematoma. Cervical spine: Degenerative straightening of the normal cervical lordosis. Moderate multilevel cervical disc degenerative disease, worst from C4-C6. Other: Mucoid retention cysts in the maxillary sinuses. CT CHEST FINDINGS Cardiovascular: Right chest port catheter. Normal heart size. Scattered left coronary artery calcifications no pericardial effusion. Mediastinum/Nodes: Unchanged treated mediastinal and right hilar soft tissue (  series 2, image 67). No discretely enlarged mediastinal, hilar, or axillary lymph nodes. Thyroid  gland, trachea, and esophagus demonstrate no significant findings. Lungs/Pleura: Severe emphysema. Diffuse bilateral bronchial wall thickening. Very extensive new heterogeneous and consolidative airspace opacity throughout the dependent right lower lobe (series 6, image 74). Otherwise unchanged post treatment appearance of the perihilar and suprahilar right lung with dense radiation fibrosis and consolidation (series 6, image 47). Trace right pleural effusion. Upper Abdomen: No acute abnormality. Musculoskeletal: No chest wall abnormality. No acute osseous findings. IMPRESSION: 1. Very extensive new heterogeneous and consolidative airspace opacity throughout the dependent right lower lobe, consistent with infection or aspiration. 2. Otherwise unchanged post treatment appearance  of the perihilar and suprahilar right lung with dense radiation fibrosis and consolidation. 3. Unchanged treated mediastinal and right hilar soft tissue. No discretely enlarged mediastinal, hilar, or axillary lymph nodes. 4. Unchanged treated left supraclavicular lymph node measuring 0.4 cm. No residual or recurrent lymphadenopathy in the neck. 5. Severe emphysema and diffuse bilateral bronchial wall thickening. 6. Coronary artery disease. Emphysema (ICD10-J43.9). Electronically Signed   By: Marolyn JONETTA Jaksch M.D.   On: 08/20/2023 12:35     ASSESSMENT AND PLAN: This is a very pleasant 58 years old white female with stage IIIB (T1b, N3, M0) non-small cell lung cancer, adenocarcinoma diagnosed in November 2022 with no actionable mutation and negative PD-L1 expression. The patient completed a course of concurrent chemoradiation with weekly carboplatin  for AUC of 2 and paclitaxel  45 Mg/M2 status post 7 cycles.  She has been tolerating her treatment well except for the mild odynophagia and skin burns. She was also recently admitted to the hospital complaining of dysphagia and odynophagia secondary to radiation induced esophagitis.  She is feeling much better but continues to have residual dysphagia.  She is followed by gastroenterology and was seen by Dr. Teressa during her hospitalization. Her scan showed improvement of her disease. I recommended for the patient treatment with consolidation immunotherapy with Imfinzi  1500 Mg IV every 4 weeks.  Status post 3 cycles.  Last dose was given in April 2023.  Her treatment was discontinued secondary to suspicious immunotherapy mediated pneumonitis with significant shortness of breath at that time and she was treated with a tapered dose of prednisone . Unfortunately her scan showed evidence for disease recurrence with enlargement of lower right cervical lymph nodes as well as mediastinal lymphadenopathy.  She has palpable right cervical lymphadenopathy. She had a PET scan at  that time and unfortunately showed significant enlargement of mediastinal and low right cervical lymph nodes consistent with worsening nodal metastatic disease but there was improvement of the heterogeneous airspace disease and consolidation throughout the right upper lobe consistent with improved radiation pneumonitis and developing radiation fibrosis. The patient underwent ultrasound-guided core biopsy of the right supraclavicular lymph nodes but the final pathology showed necrotic tumor cells with complete coagulative necrosis with focal fibrous tissue and histiocytic reaction.  She also had MRI of the brain that showed no evidence of metastatic disease to the brain. The patient was seen by Dr. Shannon and started palliative radiotherapy to the enlarging right supraclavicular lymphadenopathy.  This was completed on October 28, 2021. The patient was found to have metastatic disease in February 2024 with several lymphadenopathy in the chest as well as supraclavicular, upper abdomen as well as suspicious small liver metastasis. 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  I will [facility] the tissue biopsy to [facility] for molecular studies. She is currently undergoing systemic chemotherapy with carboplatin  for AUC of 5, Alimta  500 Mg/M2 and Keytruda  200 Mg IV every 3 weeks status post 23 cycles.  Starting from cycle #3, I reduced her dose of carboplatin  to AUC of 4 and Alimta  400 Mg/M2.  She has been off treatment for the last 6 weeks.  She has inflammatory process in the lung and will give her time to recover.  She is feeling much better today. Assessment and Plan Assessment & Plan Recurrent and metastatic non-small cell lung cancer Initially diagnosed as stage 3B in November 2022 with disease progression and metastasis in August  2023 and February 2024. Currently preparing to resume treatment with cycle number 24. Recent imaging is stable, and blood counts are adequate for treatment. Hemoglobin is above 10, and white blood cell count is slightly decreased but within acceptable range. No significant symptoms except for occasional headaches attributed to weather changes. - Administer treatment cycle number 24 today - Schedule follow-up appointment in three weeks  Abnormal weight loss Reported weight loss to 96 pounds despite being off treatment for three weeks. No specific cause identified. She was advised to call immediately if she has any concerning symptoms in the interval. The patient voices understanding of current disease status and treatment options and is in agreement with the current care plan.  All questions were answered. The patient knows to call the clinic with any problems, questions or concerns. We can certainly see the patient much sooner if necessary.  The total time spent in the appointment was 30 minutes.  Disclaimer: This note was dictated with voice recognition software. Similar sounding words can inadvertently be transcribed and may not be corrected upon review.

## 2023-09-15 LAB — T4: T4, Total: 9.1 ug/dL (ref 4.5–12.0)

## 2023-09-19 ENCOUNTER — Other Ambulatory Visit: Payer: Self-pay | Admitting: Emergency Medicine

## 2023-09-21 ENCOUNTER — Telehealth: Payer: Self-pay | Admitting: *Deleted

## 2023-09-21 NOTE — Telephone Encounter (Signed)
 Returned PC to patient, no answer, left VM - She called earlier today to relay message to Dr Sherrod:  This patient called, she went to her PCP today & wanted to let you know about some medications.  She went because of edema in her lower legs & ankles, she thought she may have cellulitis but that was ruled out, she was given a steroid cream.  She was also coughing while in their office & she has R rib/side pain under the axilla.  She was given amoxicillin  to take twice a day for 10 days.  She said you gave her something different (doxycycline ) & a steroid pack in July.  She wants to make sure this plan is agreeable with you.   Per Dr Sherrod, he is agreeable with the antibiotic prescribed by PCP.  Patient informed on VM, instructed her to contact this office with any further questions/concerns.

## 2023-09-29 NOTE — Progress Notes (Deleted)
 [facility] OFFICE PROGRESS NOTE  Falisha, [place], NP [address] [place] [address]  DIAGNOSIS: Recurrent/metastatic non-small cell lung cancer initially diagnosed as stage IIIB  (T1b, N3, M0) non-small cell lung cancer, adenocarcinoma she presented with right upper lobe nodule in addition to bulky right hilar, mediastinal, and left supraclavicular lymphadenopathy diagnosed in November 2022.  The patient had evidence for disease recurrence in the mediastinal and right supraclavicular lymphadenopathy in August 2023.  The patient had evidence of metastatic disease in February 2024 with several supraclavicular, thoracic and precarinal lymphadenopathy as well as upper abdominal lymph nodes and small liver lesion.   DETECTED ALTERATION(S) / BIOMARKER(S)% CFDNA OR AMPLIFICATIONASSOCIATED FDA-APPROVED THERAPIESCLINICAL TRIAL AVAILABILITY [id number] ND 0.5 5 50 100 4.7%   RHOAG17E ND 0.5 5 50 100 1.8%   CTNNB1S37C ND 0.5 5 50 100 1.9%   BIOMARKERADDITIONAL DETAILS Tumor Mutational Burden (TMB) 19.02 mut/Mb MSI Status Stable (MSS) PD-L1 Tumor Proportion Score (TPS)* <1%   Biomarker Findings By [facility] on 05/06/2022 Tumor Mutational Burden - 17 Muts/Mb Microsatellite status - MS-Stable Genomic Findings For a complete list of the genes assayed, please refer to the Appendix. CTNNB1 S37C CRKL amplification MAP2K4 loss exons 2-11 TP53 V143A 8 Disease relevant genes with no reportable alterations: ALK, BRAF, EGFR, ERBB2, KRAS, MET, RET, ROS1   PDL1 0%  PRIOR THERAPY: 1) Concurrent chemoradiation with carboplatin  for an AUC of 2 and paclitaxel  45 mg per metered squared.  First dose on 01/05/2021.  Status post 7 cycles of treatment.  Last dose was given February 16, 2021. 2) Consolidation immunotherapy with Imfinzi  1500 Mg IV every 4 weeks.  First dose March 25, 2021.  Status post 3 cycles.  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 3) Palliative radiotherapy to the enlarging right supraclavicular lymphadenopathy under the care of Dr. Shannon expected to be completed on October 28, 2021. 4) Radiation to the enlarging left supraclavicular lymph node under the care of Dr. Shannon. Last dose expected on 01/07/23     CURRENT THERAPY:  Palliative systemic chemotherapy with carboplatin  for AUC of 5, Alimta  500 Mg/M2 and Keytruda  200 Mg IV every 3 weeks. First dose April 05, 2022. Status post 22 cycles. Starting from cycle #3 her dose of carboplatin  was reduced to AUC of 4 and Alimta  400 Mg/M2. Starting from cycle #5 the patient will be on maintenance treatment with Alimta  and Keytruda  every 3 weeks.   INTERVAL HISTORY: Julie Nicholson 58 y.o. female returns to clinic today for follow-up visit.  The patient was last seen by Dr. Sherrod on 09/14/2023.  When she was last seen she was experiencing transient pain under her right arm.  Her treatment ha had been on hold for few weeks due to some inflammatory changes on her most recent CT scan from July 2025.   In the interval since being seen the patient presented to the emergency room on 10/01/2023 for the chief complaint of knee spasms.  No trauma was noted.  She was also on antibiotics with Keflex for possible cellulitis.  They prescribed Flexeril  and oxycodone .  She denies any fever, chills, or night sweats.  Weight and appetite?  Her breathing is?  Chest pain?  She denies any hemoptysis.  She denies any nausea, vomiting, diarrhea, or constipation.  She denies any headache or visual changes.  She is here today for evaluation and repeat blood work before undergoing cycle #25.  MEDICAL HISTORY: Past Medical History:  Diagnosis Date  Abdominal discomfort    Acute sinusitis 11/11/2021   Adenocarcinoma of right lung, stage 4 (HCC) 12/18/2020   Allergic rhinitis 05/08/2021   Antineoplastic chemotherapy induced pancytopenia (HCC) 02/23/2021   Aortic atherosclerosis  (HCC) 06/24/2023   Cancer (HCC)    Chronic cholecystitis 03/13/2013   Chronic headaches    due to allergies, sinus   Chronic venous hypertension (idiopathic) with other complications of bilateral lower extremity    Constipation 06/23/2023   COPD (chronic obstructive pulmonary disease) (HCC)    per 2012 chest xray   pt states she doesn not have this now (04/10/2013)   COVID-19 virus infection 02/24/2021   Deviated nasal septum    Dyspnea 04/07/2021   Encounter for antineoplastic chemotherapy 12/18/2020   Encounter for antineoplastic immunotherapy 11/17/2022   Eustachian tube dysfunction    GERD (gastroesophageal reflux disease)    occasional uses Tums / Rolaids   Goals of care, counseling/discussion 03/18/2021   Hearing loss    right ear   HFrEF (heart failure with reduced ejection fraction) (HCC) 07/13/2023   High cholesterol    History of radiation therapy    right lung 01/07/2021-02/19/2021  Dr Lynwood Nasuti   History of radiation therapy    Lymph nodes of head,face,neck- 12/23/22-01/07/23- Dr. 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  MEDICATIONS:  Current Outpatient Medications  Medication Sig Dispense Refill   acetaminophen  (TYLENOL ) 500 MG tablet Take 1,000 mg  by mouth every 6 (six) hours as needed for moderate pain.     albuterol  (PROVENTIL ) (2.5 MG/3ML) 0.083% nebulizer solution albuterol  sulfate 2.5 mg/3 mL (0.083 %) solution for nebulization  USE 1 VIAL IN NEBULIZER EVERY 6 HOURS AS NEEDED FOR WHEEZING OR SHORTNESS OF BREATH     albuterol  (VENTOLIN  HFA) 108 (90 Base) MCG/ACT inhaler Inhale 1-2 puffs into the lungs every 6 (six) hours as needed for wheezing or shortness of breath. 18 g 3   aspirin  EC 81 MG tablet Take 81 mg by mouth at bedtime.     atorvastatin  (LIPITOR) 40 MG tablet Take 40 mg by mouth at bedtime.     azelastine  (ASTELIN ) 0.1 % nasal spray Place 1 spray into both nostrils 2 (two) times daily as needed. Use in each nostril as directed 30 mL 5   benzonatate  (TESSALON ) 200 MG capsule Take 1 capsule (200 mg total) by mouth 3 (three) times daily as needed. 30 capsule 0   Biotin 5000 MCG TABS Take 5,000 mcg by mouth at bedtime.     buPROPion  (WELLBUTRIN  XL) 150 MG 24 hr tablet Take 150 mg by mouth at bedtime.     Ca Carbonate-Mag Hydroxide (ROLAIDS PO) Take 1 tablet by mouth daily as needed (heartburn).     doxycycline  (VIBRA -TABS) 100 MG  tablet Take 1 tablet (100 mg total) by mouth 2 (two) times daily. 30 tablet 0   famotidine  (PEPCID ) 20 MG tablet Take 1 tablet by mouth twice daily 60 tablet 0   fluticasone  (FLONASE ) 50 MCG/ACT nasal spray Place 2 sprays into both nostrils daily. 16 g 5   folic acid  (FOLVITE ) 1 MG tablet Take 1 tablet by mouth once daily 30 tablet 0   furosemide  (LASIX ) 20 MG tablet Take 20 mg by mouth as needed for fluid or edema.     levothyroxine  (SYNTHROID ) 25 MCG tablet Take 1 tablet (25 mcg total) by mouth daily before breakfast. 30 tablet 2   lidocaine -prilocaine  (EMLA ) cream APPLY TO PORT-A-CATH 30 TO 60 MINUTES BEFORE TREATMENT 30 g 0   linaCLOtide (LINZESS PO) Take 1 tablet by mouth daily.     loratadine  (CLARITIN ) 10 MG tablet Take 10 mg by mouth daily.     methylPREDNISolone  (MEDROL  DOSEPAK) 4 MG TBPK  tablet Use as instructed 21 tablet 0   Multiple Vitamins-Minerals (HAIR SKIN AND NAILS FORMULA PO) Take 1 tablet by mouth daily.     ondansetron  (ZOFRAN -ODT) 8 MG disintegrating tablet DISSOLVE 1 TABLET IN MOUTH THREE TIMES DAILY AS NEEDED 20 tablet 0   Oxycodone  HCl 10 MG TABS Take 1 tablet by mouth 3 (three) times daily as needed (pain).     polyethylene glycol powder (MIRALAX ) 17 GM/SCOOP powder Take 17 g by mouth 2 (two) times daily as needed for mild constipation. 255 g 1   prochlorperazine  (COMPAZINE ) 10 MG tablet Take 1 tablet (10 mg total) by mouth every 6 (six) hours as needed. 30 tablet 2   rizatriptan (MAXALT-MLT) 10 MG disintegrating tablet Take 10 mg by mouth as needed for migraine. May repeat in 2 hours if needed     senna-docusate (SENOKOT-S) 8.6-50 MG tablet Take 2 tablets by mouth 2 (two) times daily between meals as needed for moderate constipation.     sertraline  (ZOLOFT ) 50 MG tablet Take 1 tablet by mouth daily.     Tiotropium Bromide-Olodaterol (STIOLTO RESPIMAT ) 2.5-2.5 MCG/ACT AERS Inhale 2 puffs into the lungs daily. 12 g 3   No current facility-administered medications for this visit.    SURGICAL HISTORY:  Past Surgical History:  Procedure Laterality Date   ABDOMINAL HYSTERECTOMY  1995   tx endometriosis, both ovaries removed   ADENOIDECTOMY     APPENDECTOMY  late 1990's   BIOPSY  02/26/2021   Procedure: BIOPSY;  Surgeon: Teressa Toribio SQUIBB, MD;  Location: [facility] ENDOSCOPY;  Service: Endoscopy;;   BRONCHIAL BRUSHINGS  12/15/2020   Procedure: BRONCHIAL BRUSHINGS;  Surgeon: Shelah Lamar RAMAN, MD;  Location: [facility] ENDOSCOPY;  Service: Cardiopulmonary;;   BRONCHIAL NEEDLE ASPIRATION BIOPSY  12/15/2020   Procedure: BRONCHIAL NEEDLE ASPIRATION BIOPSIES;  Surgeon: Shelah Lamar RAMAN, MD;  Location: [facility] ENDOSCOPY;  Service: Cardiopulmonary;;   CHOLECYSTECTOMY  04/10/2013   CHOLECYSTECTOMY N/A 04/10/2013   Procedure: LAPAROSCOPIC CHOLECYSTECTOMY WITH INTRAOPERATIVE CHOLANGIOGRAM;   Surgeon: Krystal JINNY Russell, MD;  Location: [facility] OR;  Service: General;  Laterality: N/A;   ELECTROMAGNETIC NAVIGATION BROCHOSCOPY  12/15/2020   Procedure: ELECTROMAGNETIC NAVIGATION BRONCHOSCOPY;  Surgeon: Shelah Lamar RAMAN, MD;  Location: [facility] ENDOSCOPY;  Service: Cardiopulmonary;;   ESOPHAGOGASTRODUODENOSCOPY (EGD) WITH PROPOFOL  N/A 02/26/2021   Procedure: ESOPHAGOGASTRODUODENOSCOPY (EGD) WITH PROPOFOL ;  Surgeon: Teressa Toribio SQUIBB, MD;  Location: [facility] ENDOSCOPY;  Service: Endoscopy;  Laterality: N/A;   EUS N/A 02/16/2013   Procedure: UPPER ENDOSCOPIC ULTRASOUND (EUS) LINEAR;  Surgeon: Toribio SQUIBB Teressa, MD;  Location: [facility];  Service: Endoscopy;  Laterality: N/A;  radial linear   IR IMAGING GUIDED PORT INSERTION  06/30/2022   KNEE ARTHROSCOPY Right 1980's   cartilage OR   LAPAROSCOPIC ENDOMETRIOSIS FULGURATION  1980's   MYRINGOTOMY WITH TUBE PLACEMENT Right 07/13/2018   Procedure: MYRINGOTOMY WITH TUBE PLACEMENT;  Surgeon: Edda Mt, MD;  Location: [facility];  Service: ENT;  Laterality: Right;   MYRINGOTOMY WITH TUBE PLACEMENT Right 06/04/2021   Procedure: MYRINGOTOMY WITH BUTTERFLY TUBE PLACEMENT;  Surgeon: Edda Mt, MD;  Location: [facility];  Service: ENT;  Laterality: Right;   NASOPHARYNGOSCOPY EUSTATION TUBE BALLOON DILATION Right 07/13/2018   Procedure: NASOPHARYNGOSCOPY EUSTATION TUBE BALLOON DILATION;  Surgeon: Edda Mt, MD;  Location: [facility];  Service: ENT;  Laterality: Right;   TONSILLECTOMY AND ADENOIDECTOMY  ~ 1980   adenoidectomy   TUBAL LIGATION  ~ 1987   TURBINATE REDUCTION Right 07/13/2018   Procedure: OUTFRACTURE TURBINATE;  Surgeon: Edda Mt, MD;  Location: [facility];  Service: ENT;  Laterality: Right;   TYMPANOSTOMY TUBE PLACEMENT     VIDEO BRONCHOSCOPY WITH ENDOBRONCHIAL ULTRASOUND N/A 12/15/2020   Procedure: ROBOTIC VIDEO BRONCHOSCOPY WITH ENDOBRONCHIAL ULTRASOUND;  Surgeon: Shelah Lamar RAMAN, MD;  Location: [facility]  ENDOSCOPY;  Service: Cardiopulmonary;  Laterality: N/A;   WRIST SURGERY Left    w/plate    REVIEW OF SYSTEMS:   Review of Systems  Constitutional: Negative for appetite change, chills, fatigue, fever and unexpected weight change.  HENT:   Negative for mouth sores, nosebleeds, sore throat and trouble swallowing.   Eyes: Negative for eye problems and icterus.  Respiratory: Negative for cough, hemoptysis, shortness of breath and wheezing.   Cardiovascular: Negative for chest pain and leg swelling.  Gastrointestinal: Negative for abdominal pain, constipation, diarrhea, nausea and vomiting.  Genitourinary: Negative for bladder incontinence, difficulty urinating, dysuria, frequency and hematuria.   Musculoskeletal: Negative for back pain, gait problem, neck pain and neck stiffness.  Skin: Negative for itching and rash.  Neurological: Negative for dizziness, extremity weakness, gait problem, headaches, light-headedness and seizures.  Hematological: Negative for adenopathy. Does not bruise/bleed easily.  Psychiatric/Behavioral: Negative for confusion, depression and sleep disturbance. The patient is not nervous/anxious.     PHYSICAL EXAMINATION:  There were no vitals taken for this visit.  ECOG PERFORMANCE STATUS: {CHL ONC ECOG [id number]  Physical Exam  Constitutional: Oriented to person, place, and time and well-developed, well-nourished, and in no distress. No distress.  HENT:  Head: Normocephalic and atraumatic.  Mouth/Throat: Oropharynx is clear and moist. No oropharyngeal exudate.  Eyes: Conjunctivae are normal. Right eye exhibits no discharge. Left eye exhibits no discharge. No scleral icterus.  Neck: Normal range of motion. Neck supple.  Cardiovascular: Normal rate, regular rhythm, normal heart sounds and intact distal pulses.   Pulmonary/Chest: Effort normal and breath sounds normal. No respiratory distress. No wheezes. No rales.  Abdominal: Soft. Bowel sounds are normal.  Exhibits no distension and no mass. There is no tenderness.  Musculoskeletal: Normal range of motion. Exhibits no edema.  Lymphadenopathy:    No cervical adenopathy.  Neurological: Alert and oriented to person, place, and time. Exhibits normal muscle tone. Gait normal. Coordination normal.  Skin: Skin is warm and dry. No rash noted. Not diaphoretic. No erythema. No pallor.  Psychiatric: Mood, memory and judgment normal.  Vitals reviewed.  LABORATORY DATA: Lab Results  Component Value Date   WBC 2.8 (L) 09/14/2023   HGB 10.5 (L) 09/14/2023   HCT 31.1 (L) 09/14/2023   MCV  100.6 (H) 09/14/2023   PLT 170 09/14/2023      Chemistry      Component Value Date/Time   NA 139 09/14/2023 1320   K 3.3 (L) 09/14/2023 1320   CL 103 09/14/2023 1320   CO2 29 09/14/2023 1320   BUN 12 09/14/2023 1320   CREATININE 0.93 09/14/2023 1320      Component Value Date/Time   CALCIUM  9.4 09/14/2023 1320   ALKPHOS 113 09/14/2023 1320   AST 25 09/14/2023 1320   ALT 16 09/14/2023 1320   BILITOT 0.5 09/14/2023 1320       RADIOGRAPHIC STUDIES:  No results found.   ASSESSMENT/PLAN:  This is a very pleasant 58 year old Caucasian female with recurrent lung cancer initially diagnosed as stage IIIB (T1b, N3, M0) non-small cell lung cancer, adenocarcinoma.  She presented with a right upper lobe spiculated nodule and right hilar, mediastinal, and left supraclavicular adenopathy.  She was diagnosed in November 2022.   Therefore, the patient had guardant 360 performed which showed no actionable mutations.  Her PD-L1 expression is less than 1%.  She was found to have metastatic disease in February 2024    The patient completed a course of concurrent chemoradiation with carboplatin  for an AUC of 2 and paclitaxel  45 mg per metered square.  She status post 7 cycles.  She had some significant odynophagia and dysphagia from treatment.   She was seen by Dr. Teressa while admitted to the hospital and a EGD was performed  which showed single 1cm oval shaped very shallow ulceration.  She recovered.    She then had consolidation immunotherapy with Imfinzi  1500 mg IV every 4 weeks. She is status post 3 cycles. She developed suspicious immunotherapy mediated pneumonitis and this was ultimately discontinued.    The patient was seen by Dr. Shannon and started palliative radiotherapy to the enlarging right supraclavicular lymphadenopathy. This was completed on October 28, 2021.    The patient was found to have metastatic disease in February 2024 with several lymphadenopathy in the chest as well as supraclavicular, upper abdomen as well as suspicious small liver metastasis. 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   She is currently undergoing systemic chemotherapy with carboplatin  for AUC of 5, Alimta  500 Mg/M2 and Keytruda  200 Mg IV every 3 weeks status post 24 cycles.  Starting from cycle #3, Dr. Sherrod reduced her dose of carboplatin  to AUC of 4 and Alimta  400 Mg/M2. Starting from cycle #5 the patient started on maintenance treatment with Alimta  and Keytruda  every 3 weeks.    Labs were reviewed. Recommend that she proceed with cycle #25 today scheduled.   We will see her back for labs and a follow-up visit in 3 weeks for evaluation and repeat blood work before undergoing cycle #26  **Anemia  Knee?  The patient was advised to call immediately if she has any concerning symptoms in the interval. The patient voices understanding of current disease status and treatment options and is in agreement with the current care plan. All questions were answered. The patient knows to call the clinic with any problems, questions or concerns. We can certainly see the patient much sooner if necessary .     No orders of the defined types were placed  in this encounter.    I spent {CHL ONC TIME VISIT - DTPQU:[id number]} counseling the patient face to  face. The total time spent in the appointment was {CHL ONC TIME VISIT - DTPQU:[id number]}.  Adyn Hoes L Shmuel Girgis, PA-C 09/29/23

## 2023-10-01 ENCOUNTER — Emergency Department (HOSPITAL_COMMUNITY)
Admission: EM | Admit: 2023-10-01 | Discharge: 2023-10-01 | Disposition: A | Discharge status: Home / Self Care | Source: Ambulatory Visit | Attending: Emergency Medicine | Admitting: Emergency Medicine

## 2023-10-01 ENCOUNTER — Encounter (HOSPITAL_BASED_OUTPATIENT_CLINIC_OR_DEPARTMENT_OTHER): Payer: Self-pay | Admitting: Emergency Medicine

## 2023-10-01 ENCOUNTER — Emergency Department (EMERGENCY_DEPARTMENT_HOSPITAL)

## 2023-10-01 ENCOUNTER — Other Ambulatory Visit: Payer: Self-pay

## 2023-10-01 ENCOUNTER — Telehealth (HOSPITAL_COMMUNITY): Payer: Self-pay | Admitting: Emergency Medicine

## 2023-10-01 ENCOUNTER — Emergency Department (HOSPITAL_BASED_OUTPATIENT_CLINIC_OR_DEPARTMENT_OTHER)

## 2023-10-01 DIAGNOSIS — Z7982 Long term (current) use of aspirin: Secondary | ICD-10-CM | POA: Insufficient documentation

## 2023-10-01 DIAGNOSIS — M79661 Pain in right lower leg: Secondary | ICD-10-CM | POA: Insufficient documentation

## 2023-10-01 DIAGNOSIS — M79604 Pain in right leg: Secondary | ICD-10-CM

## 2023-10-01 DIAGNOSIS — J449 Chronic obstructive pulmonary disease, unspecified: Secondary | ICD-10-CM | POA: Insufficient documentation

## 2023-10-01 DIAGNOSIS — Z85118 Personal history of other malignant neoplasm of bronchus and lung: Secondary | ICD-10-CM | POA: Insufficient documentation

## 2023-10-01 DIAGNOSIS — M7989 Other specified soft tissue disorders: Secondary | ICD-10-CM | POA: Diagnosis not present

## 2023-10-01 DIAGNOSIS — I509 Heart failure, unspecified: Secondary | ICD-10-CM | POA: Insufficient documentation

## 2023-10-01 DIAGNOSIS — M25561 Pain in right knee: Secondary | ICD-10-CM | POA: Insufficient documentation

## 2023-10-01 DIAGNOSIS — L03115 Cellulitis of right lower limb: Secondary | ICD-10-CM | POA: Diagnosis not present

## 2023-10-01 LAB — CBC WITH DIFFERENTIAL/PLATELET
Abs Immature Granulocytes: 0.01 K/uL (ref 0.00–0.07)
Basophils Absolute: 0 K/uL (ref 0.0–0.1)
Basophils Relative: 0 %
Eosinophils Absolute: 0.1 K/uL (ref 0.0–0.5)
Eosinophils Relative: 1 %
HCT: 29.2 % — ABNORMAL LOW (ref 36.0–46.0)
Hemoglobin: 9.8 g/dL — ABNORMAL LOW (ref 12.0–15.0)
Immature Granulocytes: 0 %
Lymphocytes Relative: 20 %
Lymphs Abs: 1 K/uL (ref 0.7–4.0)
MCH: 34.6 pg — ABNORMAL HIGH (ref 26.0–34.0)
MCHC: 33.6 g/dL (ref 30.0–36.0)
MCV: 103.2 fL — ABNORMAL HIGH (ref 80.0–100.0)
Monocytes Absolute: 0.6 K/uL (ref 0.1–1.0)
Monocytes Relative: 12 %
Neutro Abs: 3.2 K/uL (ref 1.7–7.7)
Neutrophils Relative %: 67 %
Platelets: 143 K/uL — ABNORMAL LOW (ref 150–400)
RBC: 2.83 MIL/uL — ABNORMAL LOW (ref 3.87–5.11)
RDW: 15.7 % — ABNORMAL HIGH (ref 11.5–15.5)
WBC: 4.8 K/uL (ref 4.0–10.5)
nRBC: 0 % (ref 0.0–0.2)

## 2023-10-01 LAB — BASIC METABOLIC PANEL WITH GFR
Anion gap: 14 (ref 5–15)
BUN: 19 mg/dL (ref 6–20)
CO2: 24 mmol/L (ref 22–32)
Calcium: 10.1 mg/dL (ref 8.9–10.3)
Chloride: 98 mmol/L (ref 98–111)
Creatinine, Ser: 0.98 mg/dL (ref 0.44–1.00)
GFR, Estimated: >60 mL/min (ref >60)
Glucose, Bld: 97 mg/dL (ref 70–99)
Potassium: 3.7 mmol/L (ref 3.5–5.1)
Sodium: 136 mmol/L (ref 135–145)

## 2023-10-01 LAB — C-REACTIVE PROTEIN: CRP: 1.9 mg/dL — ABNORMAL HIGH (ref <1.0)

## 2023-10-01 LAB — LACTIC ACID, PLASMA: Lactic Acid, Venous: 0.9 mmol/L (ref 0.5–1.9)

## 2023-10-01 MED ORDER — OXYCODONE HCL 5 MG PO TABS
5.0000 mg | ORAL_TABLET | Freq: Once | ORAL | Status: AC
  Administered 2023-10-01: 5 mg via ORAL
  Filled 2023-10-01: qty 1

## 2023-10-01 MED ORDER — CYCLOBENZAPRINE HCL 10 MG PO TABS
5.0000 mg | ORAL_TABLET | Freq: Once | ORAL | Status: AC
  Administered 2023-10-01: 5 mg via ORAL
  Filled 2023-10-01: qty 1

## 2023-10-01 MED ORDER — CYCLOBENZAPRINE HCL 5 MG PO TABS: 10.0000 mg | ORAL_TABLET | Freq: Two times a day (BID) | ORAL | 0 refills | Status: DC | PRN

## 2023-10-01 MED ORDER — DICLOFENAC SODIUM 1 % EX GEL: 4.0000 g | Freq: Four times a day (QID) | CUTANEOUS | 0 refills | Status: DC

## 2023-10-01 MED ORDER — OXYCODONE-ACETAMINOPHEN 5-325 MG PO TABS
1.0000 | ORAL_TABLET | Freq: Once | ORAL | Status: AC
  Administered 2023-10-01: 1 via ORAL
  Filled 2023-10-01 (×2): qty 1

## 2023-10-01 NOTE — Progress Notes (Signed)
 VASCULAR LAB    Right lower extremity venous duplex has been performed.  See CV proc for preliminary results.   Emberly Tomasso, RVT 10/01/2023, 6:15 PM

## 2023-10-01 NOTE — Discharge Instructions (Addendum)
 Thank for letting us  evaluate you today.  You are negative for a blood clot in your leg.  Pain may be due to blood in the knee from traumatic injury, ligamentous injury.  Please follow-up with orthopedic recommendation next week for further management.  You may continue oxycodone  as prescribed by your oncologist for pain.  I provided you with Voltaren  gel for topical pain relief.  Return to emergency room if you experience a difficult worsening of swelling, warmth, redness to knee, significant worsening of symptoms

## 2023-10-01 NOTE — ED Provider Notes (Signed)
 [place] [facility] AT [facility] Provider Note   CSN: [id number] Arrival date & time: 10/01/23  1413     Patient presents with: Leg Swelling   Julie Nicholson is a 58 y.o. female.   The history is provided by the patient and medical records. No language interpreter was used.     58 year old female with history of metastatic non-small cell lung cancer, COPD, heart failure presenting with complaint of leg swelling.  Patient states yesterday she noticed some pain to the back of the calf.  Pain was waxing waning and later on the day she was laying down to move a dog crate she felt that her knee gave out falls with multiple pain to her right leg more specifically to her right knee.  Pain is worse with movement and with palpation.  No fever chest pain shortness of breath productive cough back pain numbness.  No prior history of PE or DVT no history of IV drug use or active cancer.  She went to see her PCP was sent here for rule out blood clot.  In the triage note did mention that patient had abdominal crate yesterday the patient today denies hitting anything with her leg.  No history of gout  Prior to Admission medications   Medication Sig Start Date End Date Taking? Authorizing Provider  acetaminophen  (TYLENOL ) 500 MG tablet Take 1,000 mg by mouth every 6 (six) hours as needed for moderate pain.    [provider]  albuterol  (PROVENTIL ) (2.5 MG/3ML) 0.083% nebulizer solution albuterol  sulfate 2.5 mg/3 mL (0.083 %) solution for nebulization  USE 1 VIAL IN NEBULIZER EVERY 6 HOURS AS NEEDED FOR WHEEZING OR SHORTNESS OF BREATH    [provider]  albuterol  (VENTOLIN  HFA) 108 (90 Base) MCG/ACT inhaler Inhale 1-2 puffs into the lungs every 6 (six) hours as needed for wheezing or shortness of breath. 09/23/22   Shelah Lamar RAMAN, MD  aspirin  EC 81 MG tablet Take 81 mg by mouth at bedtime.    [provider]  atorvastatin  (LIPITOR) 40 MG tablet Take 40 mg by  mouth at bedtime.    [provider]  azelastine  (ASTELIN ) 0.1 % nasal spray Place 1 spray into both nostrils 2 (two) times daily as needed. Use in each nostril as directed 11/12/22   Tobie Arleta SQUIBB, MD  benzonatate  (TESSALON ) 200 MG capsule Take 1 capsule (200 mg total) by mouth 3 (three) times daily as needed. 07/07/22   Sherrod Sherrod, MD  Biotin 5000 MCG TABS Take 5,000 mcg by mouth at bedtime.    [provider]  buPROPion  (WELLBUTRIN  XL) 150 MG 24 hr tablet Take 150 mg by mouth at bedtime. 04/12/22   [provider]  Ca Carbonate-Mag Hydroxide (ROLAIDS PO) Take 1 tablet by mouth daily as needed (heartburn).    [provider]  doxycycline  (VIBRA -TABS) 100 MG tablet Take 1 tablet (100 mg total) by mouth 2 (two) times daily. 08/24/23   Sherrod Sherrod, MD  famotidine  (PEPCID ) 20 MG tablet Take 1 tablet by mouth twice daily 09/19/23   [name], Robert S, MD  fluticasone  (FLONASE ) 50 MCG/ACT nasal spray Place 2 sprays into both nostrils daily. 04/21/23   Shelah Lamar RAMAN, MD  folic acid  (FOLVITE ) 1 MG tablet Take 1 tablet by mouth once daily 07/11/23   Sherrod Sherrod, MD  furosemide  (LASIX ) 20 MG tablet Take 20 mg by mouth as needed for fluid or edema.    [provider]  levothyroxine  (SYNTHROID )  25 MCG tablet Take 1 tablet (25 mcg total) by mouth daily before breakfast. 09/02/23   Heilingoetter, Cassandra L, PA-C  lidocaine -prilocaine  (EMLA ) cream APPLY TO PORT-A-CATH 30 TO 60 MINUTES BEFORE TREATMENT 09/27/22   Sherrod Sherrod, MD  linaCLOtide (LINZESS PO) Take 1 tablet by mouth daily.    [provider]  loratadine  (CLARITIN ) 10 MG tablet Take 10 mg by mouth daily. 05/15/23   [provider]  methylPREDNISolone  (MEDROL  DOSEPAK) 4 MG TBPK tablet Use as instructed 08/24/23   Sherrod Sherrod, MD  Multiple Vitamins-Minerals (HAIR SKIN AND NAILS FORMULA PO) Take 1 tablet by mouth daily.    [provider]  ondansetron  (ZOFRAN -ODT) 8 MG  disintegrating tablet DISSOLVE 1 TABLET IN MOUTH THREE TIMES DAILY AS NEEDED 05/27/23   Sherrod Sherrod, MD  Oxycodone  HCl 10 MG TABS Take 1 tablet by mouth 3 (three) times daily as needed (pain).    [provider]  polyethylene glycol powder (MIRALAX ) 17 GM/SCOOP powder Take 17 g by mouth 2 (two) times daily as needed for mild constipation. 06/25/23   Gonfa, Taye T, MD  prochlorperazine  (COMPAZINE ) 10 MG tablet Take 1 tablet (10 mg total) by mouth every 6 (six) hours as needed. 12/29/22   Heilingoetter, Cassandra L, PA-C  rizatriptan (MAXALT-MLT) 10 MG disintegrating tablet Take 10 mg by mouth as needed for migraine. May repeat in 2 hours if needed    [provider]  senna-docusate (SENOKOT-S) 8.6-50 MG tablet Take 2 tablets by mouth 2 (two) times daily between meals as needed for moderate constipation. 06/25/23   Gonfa, Taye T, MD  sertraline  (ZOLOFT ) 50 MG tablet Take 1 tablet by mouth daily.    [provider]  Tiotropium Bromide-Olodaterol (STIOLTO RESPIMAT ) 2.5-2.5 MCG/ACT AERS Inhale 2 puffs into the lungs daily. 09/23/22   Shelah Lamar RAMAN, MD    Allergies: Carboplatin     Review of Systems  All other systems reviewed and are negative.   Updated Vital Signs BP 131/69 (BP Location: Left Arm)   Pulse (!) 113   Temp 99.2 F (37.3 C)   Resp 16   SpO2 99%   Physical Exam Vitals and nursing note reviewed.  Constitutional:      General: She is not in acute distress.    Appearance: She is well-developed.  HENT:     Head: Atraumatic.  Eyes:     Conjunctiva/sclera: Conjunctivae normal.  Cardiovascular:     Rate and Rhythm: Tachycardia present.     Pulses: Normal pulses.     Heart sounds: Normal heart sounds.  Pulmonary:     Effort: Pulmonary effort is normal.  Abdominal:     Palpations: Abdomen is soft.     Tenderness: There is no abdominal tenderness.  Musculoskeletal:        General: Tenderness (Right leg: Right knee is warm to the touch and  edematous but not erythematous.  Able to flex and extend the knee.  Tenderness to right calf.  Trace edema to right lower extremity with intact pedal pulse.) present.     Cervical back: Neck supple.  Skin:    Findings: No rash.  Neurological:     Mental Status: She is alert.  Psychiatric:        Mood and Affect: Mood normal.     (all labs ordered are listed, but only abnormal results are displayed) Labs Reviewed - No data to display  EKG: None  Radiology: No results found.   Procedures   Medications Ordered in the  ED - No data to display                                  Medical Decision Making Amount and/or Complexity of Data Reviewed Labs: ordered.  Risk Prescription drug management.   BP 131/69 (BP Location: Left Arm)   Pulse (!) 113   Temp 99.2 F (37.3 C)   Resp 16   SpO2 99%   42:30 PM 58 year old female with history of metastatic non-small cell lung cancer, COPD, heart failure presenting with complaint of leg swelling.  Patient states yesterday she noticed some pain to the back of the calf.  Pain was waxing waning and later on the day she was laying down to move a dog crate she felt that her knee gave out falls with multiple pain to her right leg more specifically to her right knee.  Pain is worse with movement and with palpation.  No fever chest pain shortness of breath productive cough back pain numbness.  No prior history of PE or DVT no history of IV drug use or active cancer.  She went to see her PCP was sent here for rule out blood clot.  In the triage note did mention that patient had abdominal crate yesterday the patient today denies hitting anything with her leg.  No history of gout  Exam notable for tenderness and swelling about the right knee without erythema.  Patient is able to flex and extend the knee which makes it less likely to be a septic joint.  She also has reproducible pain to her right calf on palpation.  She has trace edema to her right lower  extremity.  She is neurovascular intact.  Right knee x-ray was obtained at her PCP office today and result shows no acute fracture or dislocation.  I discussed with Dr. Pamella who has also evaluated patient and felt knee pain is less likely to be septic arthritis and does not think diagnostic aspiration of the knee is indicated at this time.  Patient will need to have vascular ultrasound to rule out DVT of the right lower extremity.  If negative, consider starting antibiotic for possible infection as patient does have an oral temperature of 99.2 and is tachycardic with a heart rate of 113.  Labs have been obtained, patient will transfer by private vehicle to [facility], ER for vascular ultrasound study as ultrasound is not available at site at this time.  Dr. Curtistine Dawn is the accepting doctor.     Final diagnoses:  Right leg pain    ED Discharge Orders     None          Nivia Colon, PA-C 10/01/23 1611    Pamella Ozell LABOR, DO 10/07/23 1137

## 2023-10-01 NOTE — ED Triage Notes (Signed)
 Right leg swelling  Hit on dog crate on 8/29 Pain and swelling worse since.  Seen by PCP sent for UC to rule out blood clot Denies sob chest pain

## 2023-10-01 NOTE — ED Notes (Signed)
 PT ASKED THIS NURSE IF I WAS HER NURSE. I TOLD HER I WAS AND THAT I WOULD BE WITH HER SHORTLY AS I WAS TAKING CARE OF MORE CRITICAL PT'S. THIS NURSE COMPLETED HER TASKS WITH OTHER PT AND ASKED WHAT I COULD DO FOR HER. PT AGGRESSIVELY STATED SHE HAS BEEN ASKING FOR PAIN MEDS FOR OVER 3 HOURS. THIS NURSE EXPLAINED THAT SHE JUST CAME ON SHIFT AND WAS WORKING HER WAY DOWN TO SEE ALL HER PT'S ACCORDING TO [facility]. PT STATED SHE DID NOT LIKE MY ATTITUDE. TRIED TO EXPLAIN THAT THERE WERE NO ORDERS FOR PAIN MEDS. PT STATED SHE WANTED TO SPEAK WITH CHARGE NURSE. AT THIS POINT THIS NURSE CALLED CN AND LEFT THE PT. WENT BACK TO INFORM PT THAT CN WAS OTW. AT THIS TIME PT WAS SPEAKING TO PA-C AND WAS INFORMED THAT SHE FORGOT TO PUT IN FOR MEDS. PT STATED SHE DID NOT CARE ABOUT THAT AND IT WAS THIS NURSE'S ATTITUDE THAT SHE DID NOT LIKE. CN EXPLAINED TO THE PT THAT SHE WOULD GET THE MEDS FOR THIS PT BECAUSE SHE DID NOT WANT THIS NURSE TO TAKE CARE OF HER. AT THIS POINT THIS NURSE LEFT PT.

## 2023-10-01 NOTE — ED Notes (Cosign Needed)
 Patient requested to change pharmacy to [facility]. Rx sent   Julie Nicholson, Julie Nicholson 10/01/23 2314

## 2023-10-01 NOTE — ED Notes (Signed)
 Unsuccessful IV attempt, unable to advance. Pt tolerated well. Labs collected

## 2023-10-01 NOTE — ED Provider Notes (Addendum)
 Received patient from ED to ED transport from [facility] ED for DVT study.  Originally evaluated by Lessie PA.  See his note.  In short, patient presents to emergency department for DVT study.  Originally came into [facility] ED for right knee swelling, pain that started today.  Reports that she started feeling a spasm behind her knee yesterday when her knee gave out today.  No trauma to knee.  Denies fevers.  Of note, is on Keflex twice daily for 10 days for bilateral cellulitis of BLE. Completed day 1  Physical Exam  BP 114/71 (BP Location: Left Arm)   Pulse (!) 105   Temp 99 F (37.2 C) (Oral)   Resp 20   SpO2 100%   Physical Exam Vitals and nursing note reviewed.  Constitutional:      General: She is not in acute distress.    Appearance: Normal appearance.  HENT:     Head: Normocephalic and atraumatic.  Eyes:     Conjunctiva/sclera: Conjunctivae normal.  Cardiovascular:     Rate and Rhythm: Tachycardia present.     Comments: 105 bpm Pulmonary:     Effort: Pulmonary effort is normal. No respiratory distress.  Musculoskeletal:     Comments: Mildly swollen but non erythematous right knee when compared to left knee.  Very mild warmth when compared to left knee.  Able to flex and extend knee.  Mild erythema and trace swelling to BLE.  DP 2+ bilaterally.  Skin:    Coloration: Skin is not jaundiced or pale.  Neurological:     Mental Status: She is alert. Mental status is at baseline.     ED Course / MDM    Medical Decision Making Amount and/or Complexity of Data Reviewed Labs: ordered.  Risk Prescription drug management.   Was provided Flexeril  5 mg, Percocet PTA.  I did provide an additional oxycodone  5 mg for pain.  Agree with previous EDP provider evaluation, disposition.  Patient will continue Keflex which will cover infection in knee.  I will also provide orthopedic follow-up for further management.  Discussed ED workup, disposition, return to ED precautions with patient who  expresses understanding agrees with plan.  All questions answered to their satisfaction.  They are agreeable to plan.  Discharge instructions provided on paperwork    Minnie Tinnie BRAVO, PA 10/01/23 2033    Laurice Maude BROCKS, MD 10/01/23 2039

## 2023-10-02 ENCOUNTER — Telehealth: Payer: Self-pay

## 2023-10-02 NOTE — Telephone Encounter (Signed)
 Patient calle din to say they do not have her prescription for voltaran. Called [facility] pharmacy, this is now over the counter., made the patient aware

## 2023-10-03 ENCOUNTER — Inpatient Hospital Stay (HOSPITAL_COMMUNITY)
Admission: EM | Admit: 2023-10-03 | Discharge: 2023-10-07 | DRG: 602 | Disposition: A | Discharge status: Home / Self Care | Source: Ambulatory Visit | Attending: Internal Medicine | Admitting: Internal Medicine

## 2023-10-03 ENCOUNTER — Encounter (HOSPITAL_COMMUNITY): Payer: Self-pay

## 2023-10-03 ENCOUNTER — Emergency Department (HOSPITAL_COMMUNITY)

## 2023-10-03 ENCOUNTER — Other Ambulatory Visit: Payer: Self-pay

## 2023-10-03 DIAGNOSIS — C799 Secondary malignant neoplasm of unspecified site: Secondary | ICD-10-CM | POA: Diagnosis present

## 2023-10-03 DIAGNOSIS — Z833 Family history of diabetes mellitus: Secondary | ICD-10-CM

## 2023-10-03 DIAGNOSIS — Z923 Personal history of irradiation: Secondary | ICD-10-CM

## 2023-10-03 DIAGNOSIS — Z888 Allergy status to other drugs, medicaments and biological substances status: Secondary | ICD-10-CM

## 2023-10-03 DIAGNOSIS — J449 Chronic obstructive pulmonary disease, unspecified: Secondary | ICD-10-CM | POA: Diagnosis present

## 2023-10-03 DIAGNOSIS — I5022 Chronic systolic (congestive) heart failure: Secondary | ICD-10-CM | POA: Diagnosis present

## 2023-10-03 DIAGNOSIS — F32A Depression, unspecified: Secondary | ICD-10-CM | POA: Diagnosis present

## 2023-10-03 DIAGNOSIS — M79604 Pain in right leg: Secondary | ICD-10-CM | POA: Diagnosis not present

## 2023-10-03 DIAGNOSIS — M11261 Other chondrocalcinosis, right knee: Secondary | ICD-10-CM | POA: Diagnosis present

## 2023-10-03 DIAGNOSIS — L03115 Cellulitis of right lower limb: Secondary | ICD-10-CM | POA: Diagnosis not present

## 2023-10-03 DIAGNOSIS — I5020 Unspecified systolic (congestive) heart failure: Secondary | ICD-10-CM | POA: Diagnosis present

## 2023-10-03 DIAGNOSIS — Z79890 Hormone replacement therapy (postmenopausal): Secondary | ICD-10-CM

## 2023-10-03 DIAGNOSIS — L0390 Cellulitis, unspecified: Secondary | ICD-10-CM | POA: Diagnosis present

## 2023-10-03 DIAGNOSIS — K219 Gastro-esophageal reflux disease without esophagitis: Secondary | ICD-10-CM | POA: Diagnosis present

## 2023-10-03 DIAGNOSIS — E7800 Pure hypercholesterolemia, unspecified: Secondary | ICD-10-CM | POA: Diagnosis present

## 2023-10-03 DIAGNOSIS — Z7982 Long term (current) use of aspirin: Secondary | ICD-10-CM

## 2023-10-03 DIAGNOSIS — N179 Acute kidney failure, unspecified: Secondary | ICD-10-CM | POA: Diagnosis present

## 2023-10-03 DIAGNOSIS — G8929 Other chronic pain: Secondary | ICD-10-CM | POA: Diagnosis present

## 2023-10-03 DIAGNOSIS — Z800 Family history of malignant neoplasm of digestive organs: Secondary | ICD-10-CM

## 2023-10-03 DIAGNOSIS — Z79899 Other long term (current) drug therapy: Secondary | ICD-10-CM

## 2023-10-03 DIAGNOSIS — Z79891 Long term (current) use of opiate analgesic: Secondary | ICD-10-CM

## 2023-10-03 DIAGNOSIS — Z95828 Presence of other vascular implants and grafts: Secondary | ICD-10-CM

## 2023-10-03 DIAGNOSIS — C3491 Malignant neoplasm of unspecified part of right bronchus or lung: Secondary | ICD-10-CM | POA: Diagnosis present

## 2023-10-03 DIAGNOSIS — C3411 Malignant neoplasm of upper lobe, right bronchus or lung: Secondary | ICD-10-CM | POA: Diagnosis present

## 2023-10-03 DIAGNOSIS — D61810 Antineoplastic chemotherapy induced pancytopenia: Secondary | ICD-10-CM | POA: Diagnosis present

## 2023-10-03 DIAGNOSIS — Z681 Body mass index (BMI) 19 or less, adult: Secondary | ICD-10-CM

## 2023-10-03 DIAGNOSIS — Z8616 Personal history of COVID-19: Secondary | ICD-10-CM

## 2023-10-03 DIAGNOSIS — E039 Hypothyroidism, unspecified: Secondary | ICD-10-CM | POA: Diagnosis present

## 2023-10-03 DIAGNOSIS — E871 Hypo-osmolality and hyponatremia: Secondary | ICD-10-CM | POA: Diagnosis present

## 2023-10-03 DIAGNOSIS — T451X5A Adverse effect of antineoplastic and immunosuppressive drugs, initial encounter: Secondary | ICD-10-CM | POA: Diagnosis present

## 2023-10-03 DIAGNOSIS — Z90710 Acquired absence of both cervix and uterus: Secondary | ICD-10-CM

## 2023-10-03 DIAGNOSIS — M1990 Unspecified osteoarthritis, unspecified site: Principal | ICD-10-CM

## 2023-10-03 DIAGNOSIS — Z9049 Acquired absence of other specified parts of digestive tract: Secondary | ICD-10-CM

## 2023-10-03 DIAGNOSIS — D630 Anemia in neoplastic disease: Secondary | ICD-10-CM | POA: Diagnosis present

## 2023-10-03 DIAGNOSIS — D539 Nutritional anemia, unspecified: Secondary | ICD-10-CM

## 2023-10-03 DIAGNOSIS — Z87891 Personal history of nicotine dependence: Secondary | ICD-10-CM

## 2023-10-03 DIAGNOSIS — Z8049 Family history of malignant neoplasm of other genital organs: Secondary | ICD-10-CM

## 2023-10-03 DIAGNOSIS — E876 Hypokalemia: Secondary | ICD-10-CM | POA: Diagnosis present

## 2023-10-03 DIAGNOSIS — R945 Abnormal results of liver function studies: Secondary | ICD-10-CM | POA: Diagnosis present

## 2023-10-03 DIAGNOSIS — Z825 Family history of asthma and other chronic lower respiratory diseases: Secondary | ICD-10-CM

## 2023-10-03 DIAGNOSIS — D849 Immunodeficiency, unspecified: Secondary | ICD-10-CM | POA: Diagnosis present

## 2023-10-03 LAB — SYNOVIAL CELL COUNT + DIFF, W/ CRYSTALS
Eosinophils-Synovial: 0 % (ref 0–1)
Lymphocytes-Synovial Fld: 2 % (ref 0–20)
Monocyte-Macrophage-Synovial Fluid: 9 % — ABNORMAL LOW (ref 50–90)
Neutrophil, Synovial: 89 % — ABNORMAL HIGH (ref 0–25)
WBC, Synovial: 5780 /mm3 — ABNORMAL HIGH (ref 0–200)

## 2023-10-03 LAB — CBC WITH DIFFERENTIAL/PLATELET
Abs Immature Granulocytes: 0.01 K/uL (ref 0.00–0.07)
Basophils Absolute: 0 K/uL (ref 0.0–0.1)
Basophils Relative: 0 %
Eosinophils Absolute: 0 K/uL (ref 0.0–0.5)
Eosinophils Relative: 0 %
HCT: 29.3 % — ABNORMAL LOW (ref 36.0–46.0)
Hemoglobin: 9.6 g/dL — ABNORMAL LOW (ref 12.0–15.0)
Immature Granulocytes: 0 %
Lymphocytes Relative: 11 %
Lymphs Abs: 0.7 K/uL (ref 0.7–4.0)
MCH: 34.3 pg — ABNORMAL HIGH (ref 26.0–34.0)
MCHC: 32.8 g/dL (ref 30.0–36.0)
MCV: 104.6 fL — ABNORMAL HIGH (ref 80.0–100.0)
Monocytes Absolute: 0.7 K/uL (ref 0.1–1.0)
Monocytes Relative: 11 %
Neutro Abs: 4.7 K/uL (ref 1.7–7.7)
Neutrophils Relative %: 78 %
Platelets: 228 K/uL (ref 150–400)
RBC: 2.8 MIL/uL — ABNORMAL LOW (ref 3.87–5.11)
RDW: 15.9 % — ABNORMAL HIGH (ref 11.5–15.5)
WBC: 6.1 K/uL (ref 4.0–10.5)
nRBC: 0 % (ref 0.0–0.2)

## 2023-10-03 LAB — SEDIMENTATION RATE: Sed Rate: 139 mm/h — ABNORMAL HIGH (ref 0–22)

## 2023-10-03 LAB — COMPREHENSIVE METABOLIC PANEL WITH GFR
ALT: 34 U/L (ref 0–44)
AST: 41 U/L (ref 15–41)
Albumin: 2.9 g/dL — ABNORMAL LOW (ref 3.5–5.0)
Alkaline Phosphatase: 117 U/L (ref 38–126)
Anion gap: 10 (ref 5–15)
BUN: 10 mg/dL (ref 6–20)
CO2: 24 mmol/L (ref 22–32)
Calcium: 9 mg/dL (ref 8.9–10.3)
Chloride: 98 mmol/L (ref 98–111)
Creatinine, Ser: 1.07 mg/dL — ABNORMAL HIGH (ref 0.44–1.00)
GFR, Estimated: 60 mL/min — ABNORMAL LOW (ref >60)
Glucose, Bld: 132 mg/dL — ABNORMAL HIGH (ref 70–99)
Potassium: 3.4 mmol/L — ABNORMAL LOW (ref 3.5–5.1)
Sodium: 132 mmol/L — ABNORMAL LOW (ref 135–145)
Total Bilirubin: 1.4 mg/dL — ABNORMAL HIGH (ref 0.0–1.2)
Total Protein: 7.8 g/dL (ref 6.5–8.1)

## 2023-10-03 LAB — PROTIME-INR
INR: 1.2 (ref 0.8–1.2)
Prothrombin Time: 16 s — ABNORMAL HIGH (ref 11.4–15.2)

## 2023-10-03 LAB — C-REACTIVE PROTEIN: CRP: 18.6 mg/dL — ABNORMAL HIGH (ref <1.0)

## 2023-10-03 LAB — I-STAT CG4 LACTIC ACID, ED: Lactic Acid, Venous: 0.9 mmol/L (ref 0.5–1.9)

## 2023-10-03 MED ORDER — LACTATED RINGERS IV BOLUS
1000.0000 mL | Freq: Once | INTRAVENOUS | Status: AC
  Administered 2023-10-03: 1000 mL via INTRAVENOUS

## 2023-10-03 MED ORDER — POTASSIUM CHLORIDE 20 MEQ PO PACK
20.0000 meq | PACK | Freq: Once | ORAL | Status: AC
  Administered 2023-10-04: 20 meq via ORAL
  Filled 2023-10-03: qty 1

## 2023-10-03 MED ORDER — IOHEXOL 350 MG/ML SOLN
75.0000 mL | Freq: Once | INTRAVENOUS | Status: AC | PRN
  Administered 2023-10-03: 75 mL via INTRAVENOUS

## 2023-10-03 MED ORDER — VANCOMYCIN HCL IN DEXTROSE 1-5 GM/200ML-% IV SOLN
1000.0000 mg | Freq: Once | INTRAVENOUS | Status: AC
  Administered 2023-10-03: 1000 mg via INTRAVENOUS
  Filled 2023-10-03: qty 200

## 2023-10-03 MED ORDER — MORPHINE SULFATE (PF) 4 MG/ML IV SOLN
4.0000 mg | Freq: Once | INTRAVENOUS | Status: AC
  Administered 2023-10-03: 4 mg via INTRAVENOUS
  Filled 2023-10-03: qty 1

## 2023-10-03 MED ORDER — SODIUM CHLORIDE 0.9 % IV SOLN
2.0000 g | Freq: Once | INTRAVENOUS | Status: AC
  Administered 2023-10-03: 2 g via INTRAVENOUS
  Filled 2023-10-03: qty 20

## 2023-10-03 MED ORDER — LIDOCAINE-EPINEPHRINE (PF) 2 %-1:200000 IJ SOLN
10.0000 mL | Freq: Once | INTRAMUSCULAR | Status: AC
  Administered 2023-10-03: 10 mL
  Filled 2023-10-03: qty 20

## 2023-10-03 MED ORDER — ACETAMINOPHEN 325 MG PO TABS
650.0000 mg | ORAL_TABLET | Freq: Once | ORAL | Status: AC
  Administered 2023-10-03: 650 mg via ORAL
  Filled 2023-10-03: qty 2

## 2023-10-03 NOTE — ED Provider Notes (Signed)
 Assumption of Care note  I assumed care of the patient at 3:30 PM.  Briefly, this is a 58 year old female with metastatic non-small cell lung cancer on active chemo who is presenting with right lower extremity pain and swelling.  Patient states that she was putting together a dog crate 2 days ago when she hit her right knee on the floor.  She did not sustain abrasions or lacerations to the knee.  She has since noted significant swelling and pain from the knee to the ankle.  She has been unable to bear weight on the right lower extremity over the last day.  She was seen here yesterday for the same pain and got a DVT ultrasound of the right lower extremity which was negative.  She denies fevers.  In triage, she was tachycardic.  Patient was given broad-spectrum antibiotics with vancomycin  and ceftriaxone  prior to my arrival.  Awaiting CT with contrast of the lower extremity.  CT of the right lower extremity shows concern for moderate right knee joint effusion with concern for cellulitis around the ankle and calf.  Patient has become borderline febrile in the ED.  The knee is significantly swollen.  I added on ESR and CRP which are elevated.  I performed a joint aspiration of the knee which shows concern for inflammatory arthritis.  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  We will admit the patient for pain control, IV antibiotics and given the fact that she is unable to ambulate.   .Joint Aspiration/Arthrocentesis  Date/Time: 10/03/2023 11:14 PM  Performed by: Dionisio Blunt, MD Authorized by: Ula Prentice SAUNDERS, MD   Consent:    Consent obtained:  Verbal   Consent given by:  Patient   Risks, benefits, and alternatives were discussed: yes     Risks discussed:  Bleeding, incomplete drainage, infection, nerve damage and pain   Alternatives discussed:  No treatment, delayed treatment and alternative treatment Universal protocol:    Procedure explained and questions  answered to patient or proxy's satisfaction: yes     Relevant documents present and verified: yes     Test results available: yes     Imaging studies available: yes     Required blood products, implants, devices, and special equipment available: yes     Site/side marked: yes     Patient identity confirmed:  Hospital-assigned identification number Location:    Location:  Knee   Knee:  R knee Anesthesia:    Anesthesia method:  None Procedure details:    Preparation: Patient was prepped and draped in usual sterile fashion     Needle gauge:  18 G   Ultrasound guidance: no     Approach:  Lateral   Aspirate amount:  10cc   Aspirate characteristics:  Cloudy   Steroid injected: no   Post-procedure details:    Dressing:  Adhesive bandage   Procedure completion:  Tolerated    Medical Decision Making Problems Addressed: Inflammatory arthritis: complicated acute illness or injury that poses a threat to life or bodily functions  Amount and/or Complexity of Data Reviewed Labs: ordered. Decision-making details documented in ED Course. Radiology: ordered and independent interpretation performed. Decision-making details documented in ED Course.  Risk OTC drugs. Prescription drug management. Decision regarding hospitalization.   Dispo: Linard Dionisio Blunt, MD 10/03/23 2315    Ula Prentice SAUNDERS, MD 10/03/23 [phone]

## 2023-10-03 NOTE — ED Notes (Signed)
 Patient transported to CT

## 2023-10-03 NOTE — ED Notes (Signed)
 Extra SST, Red, DG & Lav drawn

## 2023-10-03 NOTE — ED Provider Notes (Signed)
 [place] EMERGENCY DEPARTMENT AT [facility] Provider Note   CSN: [id number] Arrival date & time: 10/03/23  1340     Patient presents with: Leg Pain   Julie Nicholson is a 58 y.o. female.  {Add pertinent medical, surgical, social history, OB history to HPI:32947}  Leg Pain      Prior to Admission medications   Medication Sig Start Date End Date Taking? Authorizing Provider  acetaminophen  (TYLENOL ) 500 MG tablet Take 1,000 mg by mouth every 6 (six) hours as needed for moderate pain.    [provider]  albuterol  (PROVENTIL ) (2.5 MG/3ML) 0.083% nebulizer solution albuterol  sulfate 2.5 mg/3 mL (0.083 %) solution for nebulization  USE 1 VIAL IN NEBULIZER EVERY 6 HOURS AS NEEDED FOR WHEEZING OR SHORTNESS OF BREATH    [provider]  albuterol  (VENTOLIN  HFA) 108 (90 Base) MCG/ACT inhaler Inhale 1-2 puffs into the lungs every 6 (six) hours as needed for wheezing or shortness of breath. 09/23/22   Shelah Lamar RAMAN, MD  aspirin  EC 81 MG tablet Take 81 mg by mouth at bedtime.    [provider]  atorvastatin  (LIPITOR) 40 MG tablet Take 40 mg by mouth at bedtime.    [provider]  azelastine  (ASTELIN ) 0.1 % nasal spray Place 1 spray into both nostrils 2 (two) times daily as needed. Use in each nostril as directed 11/12/22   Tobie Arleta SQUIBB, MD  benzonatate  (TESSALON ) 200 MG capsule Take 1 capsule (200 mg total) by mouth 3 (three) times daily as needed. 07/07/22   Sherrod Sherrod, MD  Biotin 5000 MCG TABS Take 5,000 mcg by mouth at bedtime.    [provider]  buPROPion  (WELLBUTRIN  XL) 150 MG 24 hr tablet Take 150 mg by mouth at bedtime. 04/12/22   [provider]  Ca Carbonate-Mag Hydroxide (ROLAIDS PO) Take 1 tablet by mouth daily as needed (heartburn).    [provider]  cyclobenzaprine  (FLEXERIL ) 5 MG tablet Take 2 tablets (10 mg total) by mouth 2 (two) times daily as needed for up to 5 days for muscle spasms. 10/01/23  10/06/23  Minnie Tinnie BRAVO, PA  cyclobenzaprine  (FLEXERIL ) 5 MG tablet Take 2 tablets (10 mg total) by mouth 2 (two) times daily as needed for up to 5 days for muscle spasms. 10/01/23 10/06/23  Minnie Tinnie BRAVO, PA  diclofenac  Sodium (VOLTAREN ) 1 % GEL Apply 4 g topically 4 (four) times daily. 10/01/23   Minnie Tinnie BRAVO, PA  doxycycline  (VIBRA -TABS) 100 MG tablet Take 1 tablet (100 mg total) by mouth 2 (two) times daily. 08/24/23   Sherrod Sherrod, MD  famotidine  (PEPCID ) 20 MG tablet Take 1 tablet by mouth twice daily 09/19/23   [name], Robert S, MD  fluticasone  (FLONASE ) 50 MCG/ACT nasal spray Place 2 sprays into both nostrils daily. 04/21/23   Shelah Lamar RAMAN, MD  folic acid  (FOLVITE ) 1 MG tablet Take 1 tablet by mouth once daily 07/11/23   Sherrod Sherrod, MD  furosemide  (LASIX ) 20 MG tablet Take 20 mg by mouth as needed for fluid or edema.    [provider]  levothyroxine  (SYNTHROID ) 25 MCG tablet Take 1 tablet (25 mcg total) by mouth daily before breakfast. 09/02/23   Heilingoetter, Cassandra L, PA-C  lidocaine -prilocaine  (EMLA ) cream APPLY TO PORT-A-CATH 30 TO 60 MINUTES BEFORE TREATMENT 09/27/22   Sherrod Sherrod, MD  linaCLOtide (LINZESS PO) Take 1 tablet by mouth daily.    [provider]  loratadine  (CLARITIN ) 10 MG tablet Take 10  mg by mouth daily. 05/15/23   [provider]  methylPREDNISolone  (MEDROL  DOSEPAK) 4 MG TBPK tablet Use as instructed 08/24/23   Sherrod Sherrod, MD  Multiple Vitamins-Minerals (HAIR SKIN AND NAILS FORMULA PO) Take 1 tablet by mouth daily.    [provider]  ondansetron  (ZOFRAN -ODT) 8 MG disintegrating tablet DISSOLVE 1 TABLET IN MOUTH THREE TIMES DAILY AS NEEDED 05/27/23   Sherrod Sherrod, MD  Oxycodone  HCl 10 MG TABS Take 1 tablet by mouth 3 (three) times daily as needed (pain).    [provider]  polyethylene glycol powder (MIRALAX ) 17 GM/SCOOP powder Take 17 g by mouth 2 (two) times daily as needed for mild constipation.  06/25/23   Gonfa, Taye T, MD  prochlorperazine  (COMPAZINE ) 10 MG tablet Take 1 tablet (10 mg total) by mouth every 6 (six) hours as needed. 12/29/22   Heilingoetter, Cassandra L, PA-C  rizatriptan (MAXALT-MLT) 10 MG disintegrating tablet Take 10 mg by mouth as needed for migraine. May repeat in 2 hours if needed    [provider]  senna-docusate (SENOKOT-S) 8.6-50 MG tablet Take 2 tablets by mouth 2 (two) times daily between meals as needed for moderate constipation. 06/25/23   Gonfa, Taye T, MD  sertraline  (ZOLOFT ) 50 MG tablet Take 1 tablet by mouth daily.    [provider]  Tiotropium Bromide-Olodaterol (STIOLTO RESPIMAT ) 2.5-2.5 MCG/ACT AERS Inhale 2 puffs into the lungs daily. 09/23/22   Shelah Lamar RAMAN, MD    Allergies: Carboplatin     Review of Systems  Updated Vital Signs BP 94/66   Pulse (!) 120   Temp 98 F (36.7 C)   Resp (!) 22   SpO2 96%   Physical Exam  (all labs ordered are listed, but only abnormal results are displayed) Labs Reviewed  PROTIME-INR - Abnormal; Notable for the following components:      Result Value   Prothrombin Time 16.0 (*)    All other components within normal limits  CULTURE, BLOOD (ROUTINE X 2)  CULTURE, BLOOD (ROUTINE X 2)  COMPREHENSIVE METABOLIC PANEL WITH GFR  CBC WITH DIFFERENTIAL/PLATELET  I-STAT CG4 LACTIC ACID, ED    EKG: None  Radiology: VAS US  LOWER EXTREMITY VENOUS (DVT) (ONLY [facility] & [facility]) Result Date: 10/03/2023  Lower Venous DVT Study Patient Name:  Julie Nicholson  Date of Exam:   10/01/2023 Medical Rec #: [id number]         Accession #:    [id number] Date of Birth: 1965-11-17         Patient Gender: F Patient Age:   54 years Exam Location:  [facility] Procedure:      VAS US  LOWER EXTREMITY VENOUS (DVT) Referring Phys: LONNI CAMP --------------------------------------------------------------------------------  Indications: Muscle spasms, knee and posterior calf pain. Hit leg on dog crate yesterday, increased  pain and swelling today.  Risk Factors: Cancer Metastatic stage IV non-small cell lung cancer. On palliative systemic chemotherapy and Limitations: Involuntary patient movement secondary to spasms and pain. Holding breath during spasms. Comparison Study: No prior study on file Performing Technologist: Alberta Lis RVS  Examination Guidelines: A complete evaluation includes B-mode imaging, spectral Doppler, color Doppler, and power Doppler as needed of all accessible portions of each vessel. Bilateral testing is considered an integral part of a complete examination. Limited examinations for reoccurring indications may be performed as noted. The reflux portion of the exam is performed with the patient in reverse Trendelenburg.  +---------+---------------+---------+-----------+----------+-------------------+ RIGHT    CompressibilityPhasicitySpontaneityPropertiesThrombus Aging      +---------+---------------+---------+-----------+----------+-------------------+  CFV      Full           Yes      No                                       +---------+---------------+---------+-----------+----------+-------------------+ SFJ      Full                                                             +---------+---------------+---------+-----------+----------+-------------------+ FV Prox  Full           Yes      Yes                                      +---------+---------------+---------+-----------+----------+-------------------+ FV Mid   Full                                                             +---------+---------------+---------+-----------+----------+-------------------+ FV DistalFull           Yes      Yes                                      +---------+---------------+---------+-----------+----------+-------------------+ PFV      Full           Yes      Yes                                       +---------+---------------+---------+-----------+----------+-------------------+ POP                     Yes      Yes                  patent by color and                                                       Doppler             +---------+---------------+---------+-----------+----------+-------------------+ PTV      Full                                                             +---------+---------------+---------+-----------+----------+-------------------+ PERO     Full                                                             +---------+---------------+---------+-----------+----------+-------------------+   +----+---------------+---------+-----------+----------+--------------+  LEFTCompressibilityPhasicitySpontaneityPropertiesThrombus Aging +----+---------------+---------+-----------+----------+--------------+ CFV Full           Yes      Yes                                 +----+---------------+---------+-----------+----------+--------------+ SFJ Full                                                        +----+---------------+---------+-----------+----------+--------------+     Summary: RIGHT: - There is no evidence of deep vein thrombosis in the lower extremity.  - No cystic structure found in the popliteal fossa.  LEFT: - No evidence of common femoral vein obstruction.   *See table(s) above for measurements and observations. Electronically signed by Debby Robertson on 10/03/2023 at 11:27:25 AM.    Final     {Document cardiac monitor, telemetry assessment procedure when appropriate:32947} Procedures   Medications Ordered in the ED  lactated ringers  bolus 1,000 mL (has no administration in time range)  cefTRIAXone  (ROCEPHIN ) 2 g in sodium chloride  0.9 % 100 mL IVPB (has no administration in time range)  vancomycin  (VANCOCIN ) IVPB 1000 mg/200 mL premix (has no administration in time range)  morphine  (PF) 4 MG/ML injection 4 mg (has no administration in  time range)      {Click here for ABCD2, HEART and other calculators REFRESH Note before signing:1}                              Medical Decision Making Amount and/or Complexity of Data Reviewed Labs: ordered. Radiology: ordered.  Risk Prescription drug management.   ***  {Document critical care time when appropriate  Document review of labs and clinical decision tools ie CHADS2VASC2, etc  Document your independent review of radiology images and any outside records  Document your discussion with family members, caretakers and with consultants  Document social determinants of health affecting pt's care  Document your decision making why or why not admission, treatments were needed:32947:::1}   Final diagnoses:  None    ED Discharge Orders     None

## 2023-10-03 NOTE — ED Triage Notes (Signed)
 Pt c.o continued right leg pain and swelling. Pt was seen here last night and ruled out a DVT. Pt has strong pedal pulse.

## 2023-10-04 ENCOUNTER — Encounter (HOSPITAL_COMMUNITY): Payer: Self-pay | Admitting: Internal Medicine

## 2023-10-04 ENCOUNTER — Other Ambulatory Visit: Payer: Self-pay

## 2023-10-04 ENCOUNTER — Encounter: Payer: Self-pay | Admitting: Internal Medicine

## 2023-10-04 ENCOUNTER — Inpatient Hospital Stay (HOSPITAL_COMMUNITY)

## 2023-10-04 DIAGNOSIS — I5020 Unspecified systolic (congestive) heart failure: Secondary | ICD-10-CM

## 2023-10-04 DIAGNOSIS — D849 Immunodeficiency, unspecified: Secondary | ICD-10-CM | POA: Diagnosis present

## 2023-10-04 DIAGNOSIS — M11261 Other chondrocalcinosis, right knee: Secondary | ICD-10-CM

## 2023-10-04 DIAGNOSIS — Z95828 Presence of other vascular implants and grafts: Secondary | ICD-10-CM | POA: Diagnosis not present

## 2023-10-04 DIAGNOSIS — E039 Hypothyroidism, unspecified: Secondary | ICD-10-CM | POA: Diagnosis present

## 2023-10-04 DIAGNOSIS — N179 Acute kidney failure, unspecified: Secondary | ICD-10-CM | POA: Diagnosis present

## 2023-10-04 DIAGNOSIS — M79604 Pain in right leg: Secondary | ICD-10-CM | POA: Diagnosis present

## 2023-10-04 DIAGNOSIS — Z8616 Personal history of COVID-19: Secondary | ICD-10-CM | POA: Diagnosis not present

## 2023-10-04 DIAGNOSIS — T451X5A Adverse effect of antineoplastic and immunosuppressive drugs, initial encounter: Secondary | ICD-10-CM | POA: Diagnosis present

## 2023-10-04 DIAGNOSIS — L03115 Cellulitis of right lower limb: Principal | ICD-10-CM

## 2023-10-04 DIAGNOSIS — Z681 Body mass index (BMI) 19 or less, adult: Secondary | ICD-10-CM | POA: Diagnosis not present

## 2023-10-04 DIAGNOSIS — I5022 Chronic systolic (congestive) heart failure: Secondary | ICD-10-CM | POA: Diagnosis present

## 2023-10-04 DIAGNOSIS — E7800 Pure hypercholesterolemia, unspecified: Secondary | ICD-10-CM | POA: Diagnosis present

## 2023-10-04 DIAGNOSIS — D61810 Antineoplastic chemotherapy induced pancytopenia: Secondary | ICD-10-CM | POA: Diagnosis present

## 2023-10-04 DIAGNOSIS — D539 Nutritional anemia, unspecified: Secondary | ICD-10-CM | POA: Diagnosis not present

## 2023-10-04 DIAGNOSIS — R945 Abnormal results of liver function studies: Secondary | ICD-10-CM | POA: Diagnosis present

## 2023-10-04 DIAGNOSIS — K219 Gastro-esophageal reflux disease without esophagitis: Secondary | ICD-10-CM | POA: Diagnosis present

## 2023-10-04 DIAGNOSIS — E876 Hypokalemia: Secondary | ICD-10-CM | POA: Diagnosis present

## 2023-10-04 DIAGNOSIS — J449 Chronic obstructive pulmonary disease, unspecified: Secondary | ICD-10-CM | POA: Diagnosis present

## 2023-10-04 DIAGNOSIS — D630 Anemia in neoplastic disease: Secondary | ICD-10-CM | POA: Diagnosis present

## 2023-10-04 DIAGNOSIS — E871 Hypo-osmolality and hyponatremia: Secondary | ICD-10-CM | POA: Diagnosis present

## 2023-10-04 DIAGNOSIS — C3411 Malignant neoplasm of upper lobe, right bronchus or lung: Secondary | ICD-10-CM | POA: Diagnosis present

## 2023-10-04 DIAGNOSIS — G8929 Other chronic pain: Secondary | ICD-10-CM | POA: Diagnosis present

## 2023-10-04 DIAGNOSIS — Z79890 Hormone replacement therapy (postmenopausal): Secondary | ICD-10-CM | POA: Diagnosis not present

## 2023-10-04 DIAGNOSIS — C3491 Malignant neoplasm of unspecified part of right bronchus or lung: Secondary | ICD-10-CM

## 2023-10-04 DIAGNOSIS — C799 Secondary malignant neoplasm of unspecified site: Secondary | ICD-10-CM | POA: Diagnosis present

## 2023-10-04 DIAGNOSIS — F32A Depression, unspecified: Secondary | ICD-10-CM | POA: Diagnosis present

## 2023-10-04 LAB — CBC WITH DIFFERENTIAL/PLATELET
Abs Immature Granulocytes: 0 K/uL (ref 0.00–0.07)
Basophils Absolute: 0 K/uL (ref 0.0–0.1)
Basophils Relative: 0 %
Eosinophils Absolute: 0 K/uL (ref 0.0–0.5)
Eosinophils Relative: 1 %
HCT: 21.5 % — ABNORMAL LOW (ref 36.0–46.0)
Hemoglobin: 7 g/dL — ABNORMAL LOW (ref 12.0–15.0)
Immature Granulocytes: 0 %
Lymphocytes Relative: 19 %
Lymphs Abs: 0.6 K/uL — ABNORMAL LOW (ref 0.7–4.0)
MCH: 34.5 pg — ABNORMAL HIGH (ref 26.0–34.0)
MCHC: 32.6 g/dL (ref 30.0–36.0)
MCV: 105.9 fL — ABNORMAL HIGH (ref 80.0–100.0)
Monocytes Absolute: 0.4 K/uL (ref 0.1–1.0)
Monocytes Relative: 13 %
Neutro Abs: 2 K/uL (ref 1.7–7.7)
Neutrophils Relative %: 67 %
Platelets: 149 K/uL — ABNORMAL LOW (ref 150–400)
RBC: 2.03 MIL/uL — ABNORMAL LOW (ref 3.87–5.11)
RDW: 16.2 % — ABNORMAL HIGH (ref 11.5–15.5)
WBC: 3 K/uL — ABNORMAL LOW (ref 4.0–10.5)
nRBC: 0 % (ref 0.0–0.2)

## 2023-10-04 LAB — BASIC METABOLIC PANEL WITH GFR
Anion gap: 9 (ref 5–15)
BUN: 8 mg/dL (ref 6–20)
CO2: 24 mmol/L (ref 22–32)
Calcium: 8.6 mg/dL — ABNORMAL LOW (ref 8.9–10.3)
Chloride: 104 mmol/L (ref 98–111)
Creatinine, Ser: 0.86 mg/dL (ref 0.44–1.00)
GFR, Estimated: >60 mL/min (ref >60)
Glucose, Bld: 122 mg/dL — ABNORMAL HIGH (ref 70–99)
Potassium: 3.3 mmol/L — ABNORMAL LOW (ref 3.5–5.1)
Sodium: 137 mmol/L (ref 135–145)

## 2023-10-04 LAB — CBC
HCT: 19.3 % — ABNORMAL LOW (ref 36.0–46.0)
HCT: 26.4 % — ABNORMAL LOW (ref 36.0–46.0)
Hemoglobin: 6.2 g/dL — CL (ref 12.0–15.0)
Hemoglobin: 8.7 g/dL — ABNORMAL LOW (ref 12.0–15.0)
MCH: 33.7 pg (ref 26.0–34.0)
MCH: 33.3 pg (ref 26.0–34.0)
MCHC: 32.1 g/dL (ref 30.0–36.0)
MCHC: 33 g/dL (ref 30.0–36.0)
MCV: 104.9 fL — ABNORMAL HIGH (ref 80.0–100.0)
MCV: 101.1 fL — ABNORMAL HIGH (ref 80.0–100.0)
Platelets: 131 K/uL — ABNORMAL LOW (ref 150–400)
Platelets: 203 K/uL (ref 150–400)
RBC: 1.84 MIL/uL — ABNORMAL LOW (ref 3.87–5.11)
RBC: 2.61 MIL/uL — ABNORMAL LOW (ref 3.87–5.11)
RDW: 16.4 % — ABNORMAL HIGH (ref 11.5–15.5)
RDW: 16.4 % — ABNORMAL HIGH (ref 11.5–15.5)
WBC: 2.7 K/uL — ABNORMAL LOW (ref 4.0–10.5)
WBC: 3.2 K/uL — ABNORMAL LOW (ref 4.0–10.5)
nRBC: 0 % (ref 0.0–0.2)
nRBC: 0 % (ref 0.0–0.2)

## 2023-10-04 LAB — HIV ANTIBODY (ROUTINE TESTING W REFLEX): HIV Screen 4th Generation wRfx: NONREACTIVE

## 2023-10-04 LAB — HEPATIC FUNCTION PANEL
ALT: 27 U/L (ref 0–44)
AST: 38 U/L (ref 15–41)
Albumin: 2.2 g/dL — ABNORMAL LOW (ref 3.5–5.0)
Alkaline Phosphatase: 103 U/L (ref 38–126)
Bilirubin, Direct: 0.2 mg/dL (ref 0.0–0.2)
Indirect Bilirubin: 0.5 mg/dL (ref 0.3–0.9)
Total Bilirubin: 0.7 mg/dL (ref 0.0–1.2)
Total Protein: 5.6 g/dL — ABNORMAL LOW (ref 6.5–8.1)

## 2023-10-04 LAB — PREPARE RBC (CROSSMATCH)

## 2023-10-04 LAB — MAGNESIUM: Magnesium: 1.5 mg/dL — ABNORMAL LOW (ref 1.7–2.4)

## 2023-10-04 LAB — MRSA NEXT GEN BY PCR, NASAL: MRSA by PCR Next Gen: NOT DETECTED

## 2023-10-04 MED ORDER — BUPROPION HCL ER (XL) 150 MG PO TB24
150.0000 mg | ORAL_TABLET | Freq: Every day | ORAL | Status: DC
  Administered 2023-10-04 – 2023-10-06 (×4): 150 mg via ORAL
  Filled 2023-10-04 (×5): qty 1

## 2023-10-04 MED ORDER — CYCLOBENZAPRINE HCL 5 MG PO TABS: 10.0000 mg | ORAL_TABLET | Freq: Two times a day (BID) | ORAL | 0 refills | Status: AC | PRN

## 2023-10-04 MED ORDER — LEVOTHYROXINE SODIUM 25 MCG PO TABS
25.0000 ug | ORAL_TABLET | Freq: Every day | ORAL | Status: DC
  Administered 2023-10-04 – 2023-10-07 (×4): 25 ug via ORAL
  Filled 2023-10-04 (×5): qty 1

## 2023-10-04 MED ORDER — ENOXAPARIN SODIUM 30 MG/0.3ML IJ SOSY
30.0000 mg | PREFILLED_SYRINGE | INTRAMUSCULAR | Status: DC
  Administered 2023-10-04: 30 mg via SUBCUTANEOUS
  Filled 2023-10-04: qty 0.3

## 2023-10-04 MED ORDER — OXYCODONE HCL 10 MG PO TABS: 10.0000 mg | ORAL_TABLET | Freq: Three times a day (TID) | ORAL | 0 refills | Status: AC | PRN

## 2023-10-04 MED ORDER — VANCOMYCIN HCL 500 MG/100ML IV SOLN
500.0000 mg | INTRAVENOUS | Status: DC
  Filled 2023-10-04: qty 100

## 2023-10-04 MED ORDER — POTASSIUM CHLORIDE 20 MEQ PO PACK
20.0000 meq | PACK | Freq: Two times a day (BID) | ORAL | Status: DC
  Administered 2023-10-04 (×2): 20 meq via ORAL
  Filled 2023-10-04 (×3): qty 1

## 2023-10-04 MED ORDER — LACTATED RINGERS IV BOLUS
500.0000 mL | Freq: Once | INTRAVENOUS | Status: AC
  Administered 2023-10-04: 500 mL via INTRAVENOUS

## 2023-10-04 MED ORDER — FOLIC ACID 1 MG PO TABS
1.0000 mg | ORAL_TABLET | Freq: Every day | ORAL | Status: DC
Start: 2023-10-04 — End: 2023-10-07
  Administered 2023-10-04 – 2023-10-07 (×4): 1 mg via ORAL
  Filled 2023-10-04 (×5): qty 1

## 2023-10-04 MED ORDER — ALBUMIN HUMAN 25 % IV SOLN
25.0000 g | Freq: Once | INTRAVENOUS | Status: AC
  Administered 2023-10-04: 25 g via INTRAVENOUS

## 2023-10-04 MED ORDER — ARFORMOTEROL TARTRATE 15 MCG/2ML IN NEBU
15.0000 ug | INHALATION_SOLUTION | Freq: Two times a day (BID) | RESPIRATORY_TRACT | Status: DC
  Administered 2023-10-05 – 2023-10-06 (×2): 15 ug via RESPIRATORY_TRACT
  Filled 2023-10-04 (×9): qty 2

## 2023-10-04 MED ORDER — OXYCODONE HCL 5 MG PO TABS
10.0000 mg | ORAL_TABLET | Freq: Three times a day (TID) | ORAL | Status: DC | PRN
  Administered 2023-10-04 (×4): 10 mg via ORAL
  Administered 2023-10-05: 5 mg via ORAL
  Administered 2023-10-05 – 2023-10-06 (×3): 10 mg via ORAL
  Filled 2023-10-04 (×9): qty 2

## 2023-10-04 MED ORDER — FUROSEMIDE 10 MG/ML IJ SOLN: 20.0000 mg | Freq: Once | INTRAMUSCULAR | Status: DC

## 2023-10-04 MED ORDER — UMECLIDINIUM BROMIDE 62.5 MCG/ACT IN AEPB
1.0000 | INHALATION_SPRAY | Freq: Every day | RESPIRATORY_TRACT | Status: DC
  Filled 2023-10-04 (×2): qty 7

## 2023-10-04 MED ORDER — ALBUMIN HUMAN 25 % IV SOLN
12.5000 g | Freq: Once | INTRAVENOUS | Status: DC
  Filled 2023-10-04: qty 50

## 2023-10-04 MED ORDER — MAGNESIUM SULFATE 2 GM/50ML IV SOLN
2.0000 g | Freq: Once | INTRAVENOUS | Status: AC
  Administered 2023-10-04: 2 g via INTRAVENOUS
  Filled 2023-10-04: qty 50

## 2023-10-04 MED ORDER — LINEZOLID 600 MG PO TABS
600.0000 mg | ORAL_TABLET | Freq: Two times a day (BID) | ORAL | Status: DC
  Administered 2023-10-04 – 2023-10-07 (×6): 600 mg via ORAL
  Filled 2023-10-04 (×9): qty 1

## 2023-10-04 MED ORDER — FAMOTIDINE 20 MG PO TABS
20.0000 mg | ORAL_TABLET | Freq: Two times a day (BID) | ORAL | Status: DC
Start: 2023-10-04 — End: 2023-10-07
  Administered 2023-10-04 – 2023-10-07 (×6): 20 mg via ORAL
  Filled 2023-10-04 (×9): qty 1

## 2023-10-04 MED ORDER — SODIUM CHLORIDE 0.9 % IV SOLN
2.0000 g | Freq: Two times a day (BID) | INTRAVENOUS | Status: DC
  Administered 2023-10-04: 2 g via INTRAVENOUS
  Filled 2023-10-04: qty 12.5

## 2023-10-04 MED ORDER — ATORVASTATIN CALCIUM 40 MG PO TABS
40.0000 mg | ORAL_TABLET | Freq: Every day | ORAL | Status: DC
  Administered 2023-10-04 – 2023-10-06 (×4): 40 mg via ORAL
  Filled 2023-10-04: qty 4
  Filled 2023-10-04 (×2): qty 1
  Filled 2023-10-04: qty 4
  Filled 2023-10-04: qty 1

## 2023-10-04 MED ORDER — ALBUTEROL SULFATE (2.5 MG/3ML) 0.083% IN NEBU
3.0000 mL | INHALATION_SOLUTION | Freq: Four times a day (QID) | RESPIRATORY_TRACT | Status: DC | PRN
  Filled 2023-10-04 (×4): qty 3

## 2023-10-04 MED ORDER — LINEZOLID 600 MG PO TABS
600.0000 mg | ORAL_TABLET | Freq: Two times a day (BID) | ORAL | Status: DC
  Filled 2023-10-04 (×2): qty 1

## 2023-10-04 MED ORDER — SERTRALINE HCL 50 MG PO TABS
50.0000 mg | ORAL_TABLET | Freq: Every day | ORAL | Status: DC
  Administered 2023-10-04 – 2023-10-06 (×3): 50 mg via ORAL
  Filled 2023-10-04 (×5): qty 1

## 2023-10-04 MED ORDER — CYCLOBENZAPRINE HCL 5 MG PO TABS: 10.0000 mg | ORAL_TABLET | Freq: Two times a day (BID) | ORAL | 0 refills | Status: DC | PRN

## 2023-10-04 MED ORDER — LACTATED RINGERS IV SOLN: INTRAVENOUS | Status: DC

## 2023-10-04 MED ORDER — POTASSIUM CHLORIDE CRYS ER 20 MEQ PO TBCR
20.0000 meq | EXTENDED_RELEASE_TABLET | Freq: Two times a day (BID) | ORAL | Status: DC
  Administered 2023-10-05 – 2023-10-07 (×4): 20 meq via ORAL
  Filled 2023-10-04 (×8): qty 1

## 2023-10-04 MED ORDER — SODIUM CHLORIDE 0.9 % IV SOLN
2.0000 g | INTRAVENOUS | Status: DC
  Administered 2023-10-04 – 2023-10-07 (×4): 2 g via INTRAVENOUS
  Filled 2023-10-04 (×5): qty 20

## 2023-10-04 MED ORDER — SODIUM CHLORIDE 0.9% IV SOLUTION: Freq: Once | INTRAVENOUS | Status: AC

## 2023-10-04 NOTE — Assessment & Plan Note (Signed)
 10-04-2023 has stage 4 lung cancer. F/u with oncology. Still undergoing active chemo. Last chemo on 09-14-2023 with Keytruda  and Alimta .

## 2023-10-04 NOTE — Progress Notes (Signed)
 Pt was able to walk in her house without incident using her crutches. Information in the program binder and medications was discussed with Pt and family. Vitals were assessed and documented. The patient requested that her Oxycodone  was left in a different place rather than in the organizer. The Oxycodone  was placed in a sealed temper proof bag and was requested to place it in a shoe box under the bathroom sink due to the Pt husband addicted to pain meds. Program Director Carrico and Dr. Eldonna was advised. CBC was redrawn and Port was flushed. Virtual nurse consult took place and the patient was advised to call if she needed anything.SABRA

## 2023-10-04 NOTE — Progress Notes (Addendum)
 PROGRESS NOTE    Julie Nicholson  FMW:[id number] DOB: December 24, 1965 DOA: 10/03/2023 PCP: Cristopher Suzen HERO, NP  Subjective: Pt seen and examined. Right leg is mildly painful. Wants to use RW instead of crutches. She is agreeable to being screened for Hospital at [facility] program. Would prefer going home with IV abx instead of staying in the hospital.  Needs 1 unit PRBC transfusion per my discussion with her oncologist.   Hospital Course: CC: right leg pain HPI: Julie Nicholson is a 58 y.o. female with history of recurrent and metastatic non-small cell lung cancer on systemic chemotherapy with carboplatin  Alimta  and Keytruda  every 3 weeks next 1 due next week, chronic HFrEF last EF measured was May 2025 was 40 to 45%, COPD depression anemia presents to the ER for the second time in 24 hours with complaints of worsening right leg pain and swelling.  Patient had come to the ER a day ago when Dopplers were done and was negative.  Since then the pain has been worsening she presents back to the ER.  Patient stated symptoms started on Saturday morning 48 hours ago when she woke up from sleep when she noticed that her right leg was swollen and painful.  She has good sensation of the legs and also has no restriction of the function of the leg except for the pain.  Denies any trauma or insect bites.  Last couple of days patient has been using Lasix  to decrease the fluid in the right leg.   ED Course: In the ER Dopplers were negative for DVT yesterday.  Today CT scan was done which showed knee swelling with some enhancement concerning for possible infection of the joint swelling.  There is also fluid collection around the lower leg extending to the thighs.  Aspiration of the joint showed WBC of 5700 with neutrophils of 89%.  No organisms seen.  Cultures obtained started on empiric antibiotic for cellulitis.  Patient blood pressure is in the low normal.  Per record patient's blood pressure runs low according to the  cardiology notes  Significant Events: Admitted 10/03/2023 for right leg cellulitis 10-04-2023 pt screen and accepted for Hospital at [facility] program. ABX regimen changed to IV rocephin  2 gram daily and po Zyvox  600 mg bid.  Admission Labs: WBC 6.1, HgB 9.6, plt 228 Na 132, K 3.4, CO2 of 24, BUN 10, Scr 1.07, glu 132 T prot 7.8, alb 2.9, AST 41, ALT 34, alk phos 117, t. Bili 1.4 CRP 18.6 ESR 139 Synovial fluid analysis. WBC 5780, intracellular calcium  pyrophosphate crystals.  Admission Imaging Studies: CT tib/fib Moderate joint effusion with synovial enhancement. Etiology is indeterminate, however infection is considered. Consider fluid sampling. 2. Perifascial fluid about the posterior and anterior muscle compartments of the calf, and to a lesser extent the posterior thigh. No organized or drainable fluid collection. 3. Tenosynovial fluid about the posterior tibial tendon at the ankle. 4. Subcutaneous edema, more confluent near the ankle. No subcutaneous collection. 5. No acute osseous abnormality. 6. Chondrocalcinosis about the knee.  Significant Labs:   Significant Imaging Studies:   Antibiotic Therapy: Anti-infectives (From admission, onward)    Start     Dose/Rate Route Frequency Ordered Stop   10/04/23 1500  vancomycin  (VANCOREADY) IVPB 500 mg/100 mL        500 mg 100 mL/hr over 60 Minutes Intravenous Every 24 hours 10/04/23 0134     10/04/23 0500  ceFEPIme  (MAXIPIME ) 2 g in sodium chloride  0.9 % 100 mL IVPB  2 g 200 mL/hr over 30 Minutes Intravenous Every 12 hours 10/04/23 0134     10/03/23 1445  cefTRIAXone  (ROCEPHIN ) 2 g in sodium chloride  0.9 % 100 mL IVPB        2 g 200 mL/hr over 30 Minutes Intravenous  Once 10/03/23 1443 10/03/23 1616   10/03/23 1445  vancomycin  (VANCOCIN ) IVPB 1000 mg/200 mL premix        1,000 mg 200 mL/hr over 60 Minutes Intravenous  Once 10/03/23 1443 10/03/23 1730       Procedures: Right knee arthrocentesis PRBC transfusion x 1  unit  Consultants:     Assessment and Plan: * Cellulitis of right leg 10-04-2023 admitted with 2 days of worsening pain and redness of right ankle/foot after injury her right knee while trying to put together a dog crate at home. 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 Pt still with erythema, edema and pain in right ankle/lower leg. Pt is immunosuppressed after recent IV chemo about 3 weeks ago. Will need IV ABX for several more days. Pt is a potential candidate for Hospital at [facility] program. She has good home support. Has Port-a-cath for IV access. Will get PT/OT to see patient today. Will need RW for home use. Pt will need 1 unit PRBC due to HgB of 7 g/dl. Discussed with her oncologist. Continue Cefepime  and vanco for now. Will ask ID pharmacist about best abx regimen.  **update. Pt is a candidate for Hospital at Home program. IV abx changed to IV rocephin  2 gram daily and po Zyvox  600 mg bid. MRSA screen sent. Pt with strong DP pulses in both feet. I don't think she has ischemia. ABI canceled for her right foot.  10-04-2023              Pseudogout of right knee 10-04-2023 s/p right arthrocentesis by EDP. Synovial fluid analysis negative for septic joint. Only 5780 WBC in joint fluid. +INTRACELLULAR CALCIUM  PYROPHOSPHATE CRYSTALS  consistent with pseudogout. Do not want to give her steroids given infection in right leg and recent chemo. Continue supportive care. She may be able to get some steroids after she completed abx.  Macrocytic anemia 10-04-2023 HgB 7.0 g/dl. Discussed with her primary oncologist who wanted her to have 1 unit PRBC. Have ordered this for ASAP transfusion.  HFrEF (heart failure with reduced ejection fraction) (HCC) 10-04-2023 chronic. LVEF 40-45%. Will give her 20 mg IV lasix  after PRBC infusion.  COPD (chronic obstructive pulmonary disease) (HCC) 10-04-2023 stable. Not wheezing. On RA.  Adenocarcinoma of right lung, stage 4  (HCC) 10-04-2023 has stage 4 lung cancer. F/u with oncology. Still undergoing active chemo. Last chemo on 09-14-2023 with Keytruda  and Alimta .  DVT prophylaxis: enoxaparin  (LOVENOX ) injection 30 mg Start: 10/04/23 1000    Code Status: Full Code Family Communication: talked with dtr via phone Disposition Plan: potential transfer to [facility] Reason for continuing need for hospitalization: remains on IV ABX  Objective: Vitals:   10/04/23 1300 10/04/23 1334 10/04/23 1600 10/04/23 1623  BP:   100/60 104/75  Pulse:    80  Resp:      Temp: 97.6 F (36.4 C) 97.7 F (36.5 C)    TempSrc: Oral Oral    SpO2:  97%    Weight:      Height:        Intake/Output Summary (Last 24 hours) at 10/04/2023 1633 Last data filed at 10/04/2023 0431 Gross per 24 hour  Intake 2940.12 ml  Output --  Net  2940.12 ml   Filed Weights   10/04/23 0100  Weight: 44 kg    Examination:  Physical Exam Vitals and nursing note reviewed.  Constitutional:      General: She is not in acute distress.    Appearance: She is normal weight. She is not toxic-appearing or diaphoretic.  HENT:     Head: Normocephalic and atraumatic.     Nose: Nose normal.  Eyes:     General: No scleral icterus. Cardiovascular:     Rate and Rhythm: Normal rate and regular rhythm.     Comments: +1-2 DP pulses in both feet Pulmonary:     Effort: Pulmonary effort is normal.     Breath sounds: Normal breath sounds.  Abdominal:     General: Abdomen is flat. Bowel sounds are normal.     Palpations: Abdomen is soft.  Skin:    General: Skin is warm and dry.     Capillary Refill: Capillary refill takes less than 2 seconds.     Findings: Erythema present.     Comments: Erythema of distal right LE at right ankle joint.  Small right knee effusion.  Neurological:     General: No focal deficit present.     Mental Status: She is alert and oriented to person, place, and time.        Data Reviewed: I have personally reviewed  following labs and imaging studies  CBC: Recent Labs  Lab 10/01/23 1538 10/03/23 1426 10/04/23 0453  WBC 4.8 6.1 3.0*  NEUTROABS 3.2 4.7 2.0  HGB 9.8* 9.6* 7.0*  HCT 29.2* 29.3* 21.5*  MCV 103.2* 104.6* 105.9*  PLT 143* 228 149*   Basic Metabolic Panel: Recent Labs  Lab 10/01/23 1538 10/03/23 1426 10/04/23 0453  NA 136 132* 137  K 3.7 3.4* 3.3*  CL 98 98 104  CO2 24 24 24   GLUCOSE 97 132* 122*  BUN 19 10 8   CREATININE 0.98 1.07* 0.86  CALCIUM  10.1 9.0 8.6*  MG  --   --  1.5*   GFR: Estimated Creatinine Clearance: 48.9 mL/min (by C-G formula based on SCr of 0.86 mg/dL). Liver Function Tests: Recent Labs  Lab 10/03/23 1426 10/04/23 0453  AST 41 38  ALT 34 27  ALKPHOS 117 103  BILITOT 1.4* 0.7  PROT 7.8 5.6*  ALBUMIN  2.9* 2.2*   Coagulation Profile: Recent Labs  Lab 10/03/23 1426  INR 1.2   Sepsis Labs: Recent Labs  Lab 10/01/23 1538 10/03/23 1430  LATICACIDVEN 0.9 0.9    Inflammatory Markers: Lab Results  Component Value Date/Time   ESRSEDRATE 139 (H) 10/03/2023 02:26 PM   CRP 18.6 (H) 10/03/2023 02:26 PM   Synovial Fluid Analysis: Color, Synovial YELLOW Abnormal   Appearance-Synovial HAZY Abnormal   Crystals, Fluid INTRACELLULAR CALCIUM  PYROPHOSPHATE CRYSTALS  WBC, Synovial 5,780 High   Neutrophil, Synovial 89 High   Lymphocytes-Synovial Fld 2  Monocyte-Macrophage-Synovial Fluid 9 Low   Eosinophils-Synovial 0    Recent Results (from the past 240 hours)  Culture, blood (Routine x 2)     Status: None (Preliminary result)   Collection Time: 10/03/23  2:26 PM   Specimen: BLOOD  Result Value Ref Range Status   Specimen Description BLOOD LEFT ANTECUBITAL  Final   Special Requests   Final    BOTTLES DRAWN AEROBIC AND ANAEROBIC Blood Culture results may not be optimal due to an inadequate volume of blood received in culture bottles   Culture   Final    NO GROWTH < 24  HOURS Performed at [facility], [address], [place],  [place] [address]    Report Status PENDING  Incomplete  Culture, blood (Routine x 2)     Status: None (Preliminary result)   Collection Time: 10/03/23  2:56 PM   Specimen: BLOOD RIGHT FOREARM  Result Value Ref Range Status   Specimen Description BLOOD RIGHT FOREARM  Final   Special Requests   Final    BOTTLES DRAWN AEROBIC ONLY Blood Culture results may not be optimal due to an inadequate volume of blood received in culture bottles   Culture   Final    NO GROWTH < 24 HOURS Performed at [facility], [address], [place], [place] [address]    Report Status PENDING  Incomplete  Body fluid culture w Gram Stain     Status: None (Preliminary result)   Collection Time: 10/03/23  9:11 PM   Specimen: Synovium; Body Fluid  Result Value Ref Range Status   Specimen Description SYNOVIAL RIGHT  Final   Special Requests NONE  Final   Gram Stain   Final    ABUNDANT WBC PRESENT, PREDOMINANTLY PMN NO ORGANISMS SEEN    Culture   Final    NO GROWTH < 12 HOURS Performed at [facility], [address] [place], [place] [address]    Report Status PENDING  Incomplete     Radiology Studies: CT TIBIA FIBULA RIGHT W CONTRAST Result Date: 10/03/2023 CLINICAL DATA:  Redness, swelling, pain. EXAM: CT OF THE LOWER RIGHT EXTREMITY WITH CONTRAST TECHNIQUE: Multidetector CT imaging of the lower right extremity was performed according to the standard protocol following intravenous contrast administration. RADIATION DOSE REDUCTION: This exam was performed according to the departmental dose-optimization program which includes automated exposure control, adjustment of the mA and/or kV according to patient size and/or use of iterative reconstruction technique. CONTRAST:  75mL OMNIPAQUE  IOHEXOL  350 MG/ML SOLN COMPARISON:  None Available. FINDINGS: Bones/Joint/Cartilage Moderate joint effusion with synovial enhancement. There is a Engineer, production cyst tracking posteriorly. Moderate knee chondrocalcinosis. No fracture. No  erosions or bony destructive change. No periostitis. The ankle mortise is preserved. Ligaments Suboptimally assessed by CT. Muscles and Tendons Tenosynovial fluid about the posterior tibial tendon at the ankle. Perifascial fluid about the posterior and anterior muscle compartments of the calf, and to a lesser extent the posterior thigh muscle compartment. No organized or drainable fluid collection. Soft tissues Subcutaneous edema, more confluent near the ankle. No subcutaneous collection. The vascular structures appear patent. No soft tissue gas or radiopaque foreign body. IMPRESSION: 1. Moderate joint effusion with synovial enhancement. Etiology is indeterminate, however infection is considered. Consider fluid sampling. 2. Perifascial fluid about the posterior and anterior muscle compartments of the calf, and to a lesser extent the posterior thigh. No organized or drainable fluid collection. 3. Tenosynovial fluid about the posterior tibial tendon at the ankle. 4. Subcutaneous edema, more confluent near the ankle. No subcutaneous collection. 5. No acute osseous abnormality. 6. Chondrocalcinosis about the knee. Electronically Signed   By: Andrea Gasman M.D.   On: 10/03/2023 19:57    Scheduled Meds:  arformoterol   15 mcg Nebulization BID   And   umeclidinium bromide   1 puff Inhalation Daily   atorvastatin   40 mg Oral QHS   buPROPion   150 mg Oral QHS   enoxaparin  (LOVENOX ) injection  30 mg Subcutaneous Q24H   famotidine   20 mg Oral BID   folic acid   1 mg Oral Daily   levothyroxine   25 mcg Oral QAC breakfast   linezolid   600 mg Oral Q12H   potassium chloride   20 mEq Oral BID   sertraline   50 mg Oral Daily   Continuous Infusions:  cefTRIAXone  (ROCEPHIN )  IV       LOS: 0 days   Time spent: 60 minutes  Camellia Door, DO  [facility]  10/04/2023, 4:33 PM   Hospital at Home Admission Criteria Checklist:  Formal consent explained in detail and signed at the bedside: Yes Patient meets  inpatient admission criteria (see below for further details)  Is pt Medicare FFS/[employer] Medicare-Medicaid, [employer], [employer] ( required for initial launch with plan to expand)? Yes Lives within 25 mil/ 30 min from [facility] within [place](pt may stay with family member during admission who lives within 25 miles or 30 min from [facility] w/in [facility]) ? Yes  Hemodynamically stable with relatively low risk of clinical deterioration-not requiring ICU? Yes Age >55?yes Does not require frequent touch-points or complex interventions/medications (ie Titrated Infusions (IV insulin , heparin  drips, vasoactive drips, use of infused or injectable controlled substances, patients on insulin )?  Yes Any Behavioral Health comorbidities likely to increase risk for in-home care (ie Acute delirium or experiencing a marked altered mental status and cause is not a treatable condition in the home)? no  Has the patient been on BIPAP during course of ED evaluation or hospitalization? NO IF YES, Has the patient been off of BIPAP for >24 hours(If NO-THEN PATIENT DOES MEET INCLUSION CRITERIA)?N/A Needs oxygen at home (<6L)? N/A Active safety concerns (ie Unable to use bedside commode independently and lacks caregiver support for safety- needs SNF placement, unable to obtain IV access)? No Has skin check been performed?yes. Documented by RN 10-04-2023 @ 0045 Has Physical Therapy screened the patient?yes Common admission diagnoses including: CAP, COPD Exacerbation, Acute on chronic heart failure, Cellulitis, UTI , dehydration, acute resp failure with hypoxia (requiring <5L)   Social Screening: Denies significant ETOH intake? yes Does not smoke and understands may not smoke in the presence of oxygen? yes  Patient states able to use iPad/phone for communication/has family who is able to use? yes  Patient has agreed to be compliant with medication and treatment regimen of the program?yes Any active drug use in patient  or primary caregiver including daily dosing of methadone?  yes Stable home environment ( access to appropriate heating in cold conditions and/or appropriate air conditioning in hot conditions and/or no running water/electricity)? yes  No aggressive pets at home? yes Firearm present  with ability or willingness to store them unloaded in a locked case for duration of hospitalization? yes  Ambulatory? yes (independently cane, walker  wheelchair bound, bed bound). Will Discharge home with RW.  Bed bugs present on home evaluation?no  Family support system in place? yes Patient feels safe at home does not endorse any violence? yes Any actively decompensated behavioral healthy issues including agitation/aggressive behavior? no   Patient requests food to be provided by hospital home program?no PT/OT eval completed and not requiring SNF, ALF, inpatient rehab? yes   To be admitted to the Hospital at [facility] program, a patient generally must meet the following: 1. Requirement for Inpatient Level of Care: The patient's condition must necessitate an inpatient level of care. This is typically indicated by one or more of the following, depending on their specific diagnosis: -Persistent tachycardia despite appropriate treatment (e.g., for Heart Failure, UTI). - Persistent tachypnea (rapid breathing) or dyspnea (shortness of breath) that hasn't improved sufficiently with observation  care (e.g., for Heart Failure, Pneumonia, Viral Illness, COVID). - Hypoxemia (low oxygen levels), such as a new need for oxygen, an increased need from baseline, or specific oxygen saturation levels (e.g., SpO2 <90-94% depending on the condition) that persist despite observation (e.g., for Heart Failure, COPD, Pneumonia, Viral Illness, COVID). - Need for Intravenous (IV) hydration due to an inability to maintain oral hydration, which persists despite observation care (e.g., for Cellulitis, UTI, Viral Illness, COVID). - Specific to  Heart Failure: Persistent pulmonary edema, indicated by a new oxygen need, lack of improvement with IV diuretics, and ongoing tachypnea/dyspnea. - Specific to COPD: A decrease in known baseline resting oxygen saturation (SpO2) by 4% or more, or an increase in pre-existing supplemental oxygen requirements, which persists despite observation and requires continued close monitoring. - Specific to Pneumonia: A Pneumonia Severity Index (PSI) class IV (moderate risk). - Specific to Cellulitis: Failure of outpatient antibiotic therapy (indicated by progression or no improvement after a minimum of 48 hours on an adequate regimen) or a clinical presentation (like acuity or rapidity of progression) that requires the intensity of monitoring found in an inpatient setting. - Specific to UTI: Persistence or worsening of clinical findings like fever, pain, or dehydration despite observation care; presence of significant uropathy; suspected infection of an indwelling prosthetic device, stent, implant, or graft; or pregnancy with suspected pyelonephritis. 2. Appropriateness for Hospital at Home Setting: The patient's overall clinical picture, including the severity of their illness, their care needs, and their medical history and comorbidities, must be suitable for management in the Hospital at Home environment. This essentially means that none of the exclusion criteria (listed below) are met.  Unified Exclusion Criteria for Hospital at Home Admission: A patient would not be eligible for Hospital at Home if any of the following are present: - Hemodynamic Instability: - Hypotension (low blood pressure) is present. - Respiratory Instability or Needs Beyond Program Capability: - There is a new need for invasive or noninvasive ventilatory assistance (like BiPAP or a ventilator). - Oxygenation is not sufficient, generally indicated if an FiO2 (fraction of inspired oxygen) of 45% (which is about 6 Liters/minute  via nasal cannula) or more is required to keep oxygen saturation (SpO2) at 90% or greater. - Monitoring or Procedural Needs Beyond Program Capability: - There is a need for invasive monitoring, such as a pulmonary artery catheter or an arterial line. - There is a need for immediate-response telemetry monitoring (for dangerous arrhythmia detection and subsequent immediate intervention). - The required medication regimen is beyond the capabilities of Hospital at Home (e.g., dosing intervals are too frequent for home administration). - There is a need for a procedure that cannot be performed by the Hospital at [facility] team (e.g., significant wound debridement or abscess drainage for cellulitis, or percutaneous nephrostomy for a complicated UTI). - Significant Organ Dysfunction or Markers of Severe Illness: - Mental status is not at baseline, or there is altered mental status suggestive of inadequate perfusion. - Renal (kidney) function is unstable or showing an ongoing decline. - There is evidence of inadequate perfusion, such as metabolic acidosis or myocardial ischemia. - Uncompensated acidosis is present. - Condition-Specific Severity or Complications Making Home Care Unsuitable: -For Heart Failure: Known severe cardiac valvular disease (e.g., aortic stenosis, mitral regurgitation); or severe peripheral edema that impairs the ability to urinate or ambulate. -For COPD: Known concurrent comorbidity or finding that indicates a higher-risk COPD exacerbation (e.g., pulmonary fibrosis, cavitation, pleural effusion, pneumothorax, rib fracture). -For Pneumonia: Pneumonia Severity Index (PSI) class  V (indicating high risk for inpatient mortality); known concurrent comorbidity or finding that indicates higher-risk pneumonia (e.g., pulmonary fibrosis, cavitation, large or loculated pleural effusion); or a concomitant serious infectious process like endocarditis or empyema. -For Cellulitis: Orbital,  periorbital, or necrotizing infection is suspected; or a concomitant serious infectious process like endocarditis, septic emboli, or septic joint space infection. -For UTI: Urinary tract obstruction (e.g., kidney stone, bladder outlet obstruction); or a concomitant serious infectious process like endocarditis or septic emboli. -For Viral Illness & COVID-19: A concomitant serious infectious process like endocarditis or empyema. General Comorbidities or Status: -The patient is significantly immunosuppressed (this applies to Pneumonia, Cellulitis, UTI, Viral Illness, and COVID-19). -The patient meets inpatient admission criteria for a second diagnosis, or has care needs beyond the capabilities of Hospital at Home due to an active clinically significant comorbidity. (This is a general exclusion across all listed conditions)

## 2023-10-04 NOTE — Care Management (Signed)
 Order for walker faxed into [employer] after hours DME department [phone]. Awaiting order confirmation with delivery ETD.

## 2023-10-04 NOTE — H&P (Addendum)
 History and Physical    Julie Nicholson FMW:[id number] DOB: 11-25-1965 DOA: 10/03/2023  Patient coming from: Home.  Chief Complaint: Right lower extremity pain.  HPI: Julie Nicholson is a 58 y.o. female with history of recurrent and metastatic non-small cell lung cancer on systemic chemotherapy with carboplatin  Alimta  and Keytruda  every 3 weeks next 1 due next week, chronic HFrEF last EF measured was May 2025 was 40 to 45%, COPD depression anemia presents to the ER for the second time in 24 hours with complaints of worsening right leg pain and swelling.  Patient had come to the ER a day ago when Dopplers were done and was negative.  Since then the pain has been worsening she presents back to the ER.  Patient stated symptoms started on Saturday morning 48 hours ago when she woke up from sleep when she noticed that her right leg was swollen and painful.  She has good sensation of the legs and also has no restriction of the function of the leg except for the pain.  Denies any trauma or insect bites.  Last couple of days patient has been using Lasix  to decrease the fluid in the right leg.  ED Course: In the ER Dopplers were negative for DVT yesterday.  Today CT scan was done which showed knee swelling with some enhancement concerning for possible infection of the joint swelling.  There is also fluid collection around the lower leg extending to the thighs.  Aspiration of the joint showed WBC of 5700 with neutrophils of 89%.  No organisms seen.  Cultures obtained started on empiric antibiotic for cellulitis.  Patient blood pressure is in the low normal.  Per record patient's blood pressure runs low according to the cardiology notes.  Review of Systems: As per HPI, rest all negative.   Past Medical History:  Diagnosis Date   Abdominal discomfort    Acute sinusitis 11/11/2021   Adenocarcinoma of right lung, stage 4 (HCC) 12/18/2020   Allergic rhinitis 05/08/2021   Antineoplastic chemotherapy induced  pancytopenia (HCC) 02/23/2021   Aortic atherosclerosis (HCC) 06/24/2023   Cancer (HCC)    Chronic cholecystitis 03/13/2013   Chronic headaches    due to allergies, sinus   Chronic venous hypertension (idiopathic) with other complications of bilateral lower extremity    Constipation 06/23/2023   COPD (chronic obstructive pulmonary disease) (HCC)    per 2012 chest xray   pt states she doesn not have this now (04/10/2013)   COVID-19 virus infection 02/24/2021   Deviated nasal septum    Dyspnea 04/07/2021   Encounter for antineoplastic chemotherapy 12/18/2020   Encounter for antineoplastic immunotherapy 11/17/2022   Eustachian tube dysfunction    GERD (gastroesophageal reflux disease)    occasional uses Tums / Rolaids   Goals of care, counseling/discussion 03/18/2021   Hearing loss    right ear   HFrEF (heart failure with reduced ejection fraction) (HCC) 07/13/2023   High cholesterol    History of radiation therapy    right lung 01/07/2021-02/19/2021  Dr Lynwood Nasuti   History of radiation therapy    Lymph nodes of head,face,neck- 12/23/22-01/07/23- Dr. 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   RUQ pain 03/06/2013   EUS, EGD negative  HIDA scan with ejection fraction shows increased ejection fraction  No gallstones on ultrasound or EUS     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   Tobacco use 12/09/2020   Upper airway cough syndrome 05/08/2021    Past Surgical History:  Procedure Laterality Date   ABDOMINAL HYSTERECTOMY  1995   tx endometriosis, both ovaries  removed   ADENOIDECTOMY     APPENDECTOMY  late 1990's   BIOPSY  02/26/2021   Procedure: BIOPSY;  Surgeon: Teressa Toribio SQUIBB, MD;  Location: [facility] END[facility]COPY;  Service: Endoscopy;;   BRONCHIAL BRUSHINGS  12/15/2020   Procedure: BRONCHIAL BRUSHINGS;  Surgeon: Shelah Lamar RAMAN, MD;  Location: [facility] ENDOSCOPY;  Service: Cardiopulmonary;;   BRONCHIAL NEEDLE ASPIRATION BIOPSY  12/15/2020   Procedure: BRONCHIAL NEEDLE ASPIRATION BIOPSIES;  Surgeon: Shelah Lamar RAMAN, MD;  Location: [facility] ENDOSCOPY;  Service: Cardiopulmonary;;   CHOLECYSTECTOMY  04/10/2013   CHOLECYSTECTOMY N/A 04/10/2013   Procedure: LAPAROSCOPIC CHOLECYSTECTOMY WITH INTRAOPERATIVE CHOLANGIOGRAM;  Surgeon: Krystal JINNY Russell, MD;  Location: [facility] OR;  Service: General;  Laterality: N/A;   ELECTROMAGNETIC NAVIGATION BROCHOSCOPY  12/15/2020   Procedure: ELECTROMAGNETIC NAVIGATION BRONCHOSCOPY;  Surgeon: Shelah Lamar RAMAN, MD;  Location: [facility] ENDOSCOPY;  Service: Cardiopulmonary;;   ESOPHAGOGASTRODUODENOSCOPY (EGD) WITH PROPOFOL  N/A 02/26/2021   Procedure: ESOPHAGOGASTRODUODENOSCOPY (EGD) WITH PROPOFOL ;  Surgeon: Teressa Toribio SQUIBB, MD;  Location: [facility];  Service: Endoscopy;  Laterality: N/A;   EUS N/A 02/16/2013   Procedure: UPPER ENDOSCOPIC ULTRASOUND (EUS) LINEAR;  Surgeon: Toribio SQUIBB Teressa, MD;  Location: [facility] END[facility]COPY;  Service: Endoscopy;  Laterality: N/A;  radial linear   IR IMAGING GUIDED PORT INSERTION  06/30/2022   KNEE ARTHROSCOPY Right 1980's   cartilage OR   LAPAROSCOPIC ENDOMETRIOSIS FULGURATION  1980's   MYRINGOTOMY WITH TUBE PLACEMENT Right 07/13/2018   Procedure: MYRINGOTOMY WITH TUBE PLACEMENT;  Surgeon: Edda Mt, MD;  Location: [facility];  Service: ENT;  Laterality: Right;   MYRINGOTOMY WITH TUBE PLACEMENT Right 06/04/2021   Procedure: MYRINGOTOMY WITH BUTTERFLY TUBE PLACEMENT;  Surgeon: Edda Mt, MD;  Location: [facility];  Service: ENT;  Laterality: Right;   NASOPHARYNGOSCOPY EUSTATION TUBE BALLOON  DILATION Right 07/13/2018   Procedure: NASOPHARYNGOSCOPY EUSTATION TUBE BALLOON DILATION;  Surgeon: Edda Mt, MD;  Location: [facility];  Service: ENT;  Laterality: Right;   TONSILLECTOMY AND ADENOIDECTOMY  ~ 1980   adenoidectomy   TUBAL LIGATION  ~ 1987   TURBINATE REDUCTION Right 07/13/2018   Procedure: OUTFRACTURE TURBINATE;  Surgeon: Edda Mt, MD;  Location: [facility];  Service: ENT;  Laterality: Right;   TYMPANOSTOMY TUBE PLACEMENT     VIDEO BRONCHOSCOPY WITH ENDOBRONCHIAL ULTRASOUND N/A 12/15/2020   Procedure: ROBOTIC VIDEO BRONCHOSCOPY WITH ENDOBRONCHIAL ULTRASOUND;  Surgeon: Shelah Lamar RAMAN, MD;  Location: [facility] ENDOSCOPY;  Service: Cardiopulmonary;  Laterality: N/A;   WRIST SURGERY Left    w/plate     reports that she quit smoking about 2 years ago. Her smoking use included cigarettes. She started smoking about 43 years ago. She has a 20.5 pack-year smoking history. She has been exposed to tobacco smoke. She has never used smokeless tobacco. She reports that she does not currently use alcohol. She reports that she does not use drugs.  Allergies  Allergen Reactions   Carboplatin  Shortness Of Breath, Itching and Cough    See progress note from 06/16/22    Family History  Problem Relation Age of Onset   Diabetes Mother    COPD Mother    Rectal cancer Mother    Cervical cancer Mother    Diabetes Father    COPD Maternal Aunt    COPD Maternal Uncle    COPD Maternal Grandfather     Prior to Admission medications   Medication Sig Start Date End Date Taking? Authorizing Provider  acetaminophen  (TYLENOL ) 500 MG tablet Take 1,000 mg by mouth every 6 (six) hours as needed for moderate pain.    [provider]  albuterol  (PROVENTIL ) (2.5 MG/3ML) 0.083% nebulizer solution albuterol  sulfate 2.5 mg/3 mL (0.083 %) solution for nebulization  USE 1 VIAL IN NEBULIZER EVERY 6 HOURS AS NEEDED FOR WHEEZING OR SHORTNESS OF BREATH    [provider]   albuterol  (VENTOLIN  HFA) 108 (90 Base) MCG/ACT inhaler Inhale 1-2 puffs into the lungs every 6 (six) hours as needed for wheezing or shortness of breath. 09/23/22   Shelah Lamar RAMAN, MD  aspirin  EC 81 MG tablet Take 81 mg by mouth at bedtime.    [provider]  atorvastatin  (LIPITOR) 40 MG tablet Take 40 mg by mouth at bedtime.    [provider]  azelastine  (ASTELIN ) 0.1 % nasal spray Place 1 spray into both nostrils 2 (two) times daily as needed. Use in each nostril as directed 11/12/22   Tobie Arleta SQUIBB, MD  benzonatate  (TESSALON ) 200 MG capsule Take 1 capsule (200 mg total) by mouth 3 (three) times daily as needed. 07/07/22   Sherrod Sherrod, MD  Biotin 5000 MCG TABS Take 5,000 mcg by mouth at bedtime.    [provider]  buPROPion  (WELLBUTRIN  XL) 150 MG 24 hr tablet Take 150 mg by mouth at bedtime. 04/12/22   [provider]  Ca Carbonate-Mag Hydroxide (ROLAIDS PO) Take 1 tablet by mouth daily as needed (heartburn).    [provider]  cyclobenzaprine  (FLEXERIL ) 5 MG tablet Take 2 tablets (10 mg total) by mouth 2 (two) times daily as needed for up to 5 days for muscle spasms. 10/01/23 10/06/23  Minnie Tinnie BRAVO, PA  cyclobenzaprine  (FLEXERIL ) 5 MG tablet Take 2 tablets (10 mg total) by mouth 2 (two) times daily as needed for up to 5 days for muscle spasms. 10/01/23 10/06/23  Minnie Tinnie BRAVO, PA  diclofenac  Sodium (VOLTAREN ) 1 % GEL Apply 4 g topically 4 (four) times daily. 10/01/23   Minnie Tinnie BRAVO, PA  doxycycline  (VIBRA -TABS) 100 MG tablet Take 1 tablet (100 mg total) by mouth 2 (two) times daily. 08/24/23   Sherrod Sherrod, MD  famotidine  (PEPCID ) 20 MG tablet Take 1 tablet by mouth twice daily 09/19/23   [name], Robert S, MD  fluticasone  (FLONASE ) 50 MCG/ACT nasal spray Place 2 sprays into both nostrils daily. 04/21/23   Shelah Lamar RAMAN, MD  folic acid  (FOLVITE ) 1 MG tablet Take 1 tablet by mouth once daily 07/11/23   Sherrod Sherrod, MD  furosemide  (LASIX ) 20  MG tablet Take 20 mg by mouth as needed for fluid or edema.    [provider]  levothyroxine  (SYNTHROID ) 25 MCG tablet Take 1 tablet (25 mcg total) by mouth daily before breakfast. 09/02/23   Heilingoetter, Cassandra L, PA-C  lidocaine -prilocaine  (EMLA ) cream APPLY TO PORT-A-CATH 30 TO 60 MINUTES BEFORE TREATMENT 09/27/22   Sherrod Sherrod, MD  linaCLOtide (LINZESS PO) Take 1 tablet by mouth daily.    [provider]  loratadine  (CLARITIN ) 10 MG tablet Take 10 mg by mouth daily. 05/15/23  [provider]  methylPREDNISolone  (MEDROL  DOSEPAK) 4 MG TBPK tablet Use as instructed 08/24/23   Sherrod Sherrod, MD  Multiple Vitamins-Minerals (HAIR SKIN AND NAILS FORMULA PO) Take 1 tablet by mouth daily.    [provider]  ondansetron  (ZOFRAN -ODT) 8 MG disintegrating tablet DISSOLVE 1 TABLET IN MOUTH THREE TIMES DAILY AS NEEDED 05/27/23   Sherrod Sherrod, MD  Oxycodone  HCl 10 MG TABS Take 1 tablet by mouth 3 (three) times daily as needed (pain).    [provider]  polyethylene glycol powder (MIRALAX ) 17 GM/SCOOP powder Take 17 g by mouth 2 (two) times daily as needed for mild constipation. 06/25/23   Gonfa, Taye T, MD  prochlorperazine  (COMPAZINE ) 10 MG tablet Take 1 tablet (10 mg total) by mouth every 6 (six) hours as needed. 12/29/22   Heilingoetter, Cassandra L, PA-C  rizatriptan (MAXALT-MLT) 10 MG disintegrating tablet Take 10 mg by mouth as needed for migraine. May repeat in 2 hours if needed    [provider]  senna-docusate (SENOKOT-S) 8.6-50 MG tablet Take 2 tablets by mouth 2 (two) times daily between meals as needed for moderate constipation. 06/25/23   Gonfa, Taye T, MD  sertraline  (ZOLOFT ) 50 MG tablet Take 1 tablet by mouth daily.    [provider]  Tiotropium Bromide-Olodaterol (STIOLTO RESPIMAT ) 2.5-2.5 MCG/ACT AERS Inhale 2 puffs into the lungs daily. 09/23/22   Shelah Lamar RAMAN, MD    Physical Exam: Constitutional: Moderately  built and nourished. Vitals:   10/03/23 2130 10/03/23 2300 10/03/23 2315 10/04/23 0016  BP: (!) 101/54 (!) 102/53 (!) 96/52 (!) 87/55  Pulse: 94 93 94 100  Resp:  14 16 18   Temp:    97.9 F (36.6 C)  TempSrc:    Oral  SpO2: 100% 100% 100% 95%   Eyes: Anicteric no pallor. ENMT: No discharge from the ears eyes nose or mouth. Neck: No mass felt.  No neck rigidity. Respiratory: No rhonchi or crepitations. Cardiovascular: S1-S2 heard. Abdomen: Soft nontender bowel sound present. Musculoskeletal: Right leg is warm to touch and swollen patient able to flex the knee and also the foot.  Swollen right knee joint. Skin: Erythematous right lower extremity erythema extending from the foot to the distal thigh of the right leg. Neurologic: Alert awake oriented to time place and person.  Moves all extremities. Psychiatric: Appears normal.  Normal affect.   Labs on Admission: I have personally reviewed following labs and imaging studies  CBC: Recent Labs  Lab 10/01/23 1538 10/03/23 1426  WBC 4.8 6.1  NEUTROABS 3.2 4.7  HGB 9.8* 9.6*  HCT 29.2* 29.3*  MCV 103.2* 104.6*  PLT 143* 228   Basic Metabolic Panel: Recent Labs  Lab 10/01/23 1538 10/03/23 1426  NA 136 132*  K 3.7 3.4*  CL 98 98  CO2 24 24  GLUCOSE 97 132*  BUN 19 10  CREATININE 0.98 1.07*  CALCIUM  10.1 9.0   GFR: CrCl cannot be calculated (Unknown ideal weight.). Liver Function Tests: Recent Labs  Lab 10/03/23 1426  AST 41  ALT 34  ALKPHOS 117  BILITOT 1.4*  PROT 7.8  ALBUMIN  2.9*   No results for input(s): LIPASE, AMYLASE in the last 168 hours. No results for input(s): AMMONIA in the last 168 hours. Coagulation Profile: Recent Labs  Lab 10/03/23 1426  INR 1.2   Cardiac Enzymes: No results for input(s): CKTOTAL, CKMB, CKMBINDEX, TROPONINI in the last 168 hours. BNP (last 3 results) No results for input(s): PROBNP in the last 8760  hours. HbA1C: No results for input(s): HGBA1C in the  last 72 hours. CBG: No results for input(s): GLUCAP in the last 168 hours. Lipid Profile: No results for input(s): CHOL, HDL, LDLCALC, TRIG, CHOLHDL, LDLDIRECT in the last 72 hours. Thyroid  Function Tests: No results for input(s): TSH, T4TOTAL, FREET4, T3FREE, THYROIDAB in the last 72 hours. Anemia Panel: No results for input(s): VITAMINB12, FOLATE, FERRITIN, TIBC, IRON, RETICCTPCT in the last 72 hours. Urine analysis:    Component Value Date/Time   COLORURINE YELLOW 06/23/2023 1111   APPEARANCEUR CLEAR 06/23/2023 1111   LABSPEC 1.015 06/23/2023 1111   PHURINE 7.0 06/23/2023 1111   GLUCOSEU NEGATIVE 06/23/2023 1111   GLUCOSEU NEGATIVE 03/01/2013 0733   HGBUR NEGATIVE 06/23/2023 1111   BILIRUBINUR NEGATIVE 06/23/2023 1111   KETONESUR NEGATIVE 06/23/2023 1111   PROTEINUR NEGATIVE 06/23/2023 1111   UROBILINOGEN 0.2 03/01/2013 0733   NITRITE NEGATIVE 06/23/2023 1111   LEUKOCYTESUR NEGATIVE 06/23/2023 1111   Sepsis Labs: @LABRCNTIP (procalcitonin:4,lacticidven:4) ) Recent Results (from the past 240 hours)  Body fluid culture w Gram Stain     Status: None (Preliminary result)   Collection Time: 10/03/23  9:11 PM   Specimen: Synovium; Body Fluid  Result Value Ref Range Status   Specimen Description SYNOVIAL RIGHT  Final   Special Requests NONE  Final   Gram Stain   Final    ABUNDANT WBC PRESENT, PREDOMINANTLY PMN NO ORGANISMS SEEN Performed at [facility], [address] [place], [place] [address]    Culture PENDING  Incomplete   Report Status PENDING  Incomplete     Radiological Exams on Admission: CT TIBIA FIBULA RIGHT W CONTRAST Result Date: 10/03/2023 CLINICAL DATA:  Redness, swelling, pain. EXAM: CT OF THE LOWER RIGHT EXTREMITY WITH CONTRAST TECHNIQUE: Multidetector CT imaging of the lower right extremity was performed according to the standard protocol following intravenous contrast administration. RADIATION DOSE REDUCTION: This  exam was performed according to the departmental dose-optimization program which includes automated exposure control, adjustment of the mA and/or kV according to patient size and/or use of iterative reconstruction technique. CONTRAST:  75mL OMNIPAQUE  IOHEXOL  350 MG/ML SOLN COMPARISON:  None Available. FINDINGS: Bones/Joint/Cartilage Moderate joint effusion with synovial enhancement. There is a Engineer, production cyst tracking posteriorly. Moderate knee chondrocalcinosis. No fracture. No erosions or bony destructive change. No periostitis. The ankle mortise is preserved. Ligaments Suboptimally assessed by CT. Muscles and Tendons Tenosynovial fluid about the posterior tibial tendon at the ankle. Perifascial fluid about the posterior and anterior muscle compartments of the calf, and to a lesser extent the posterior thigh muscle compartment. No organized or drainable fluid collection. Soft tissues Subcutaneous edema, more confluent near the ankle. No subcutaneous collection. The vascular structures appear patent. No soft tissue gas or radiopaque foreign body. IMPRESSION: 1. Moderate joint effusion with synovial enhancement. Etiology is indeterminate, however infection is considered. Consider fluid sampling. 2. Perifascial fluid about the posterior and anterior muscle compartments of the calf, and to a lesser extent the posterior thigh. No organized or drainable fluid collection. 3. Tenosynovial fluid about the posterior tibial tendon at the ankle. 4. Subcutaneous edema, more confluent near the ankle. No subcutaneous collection. 5. No acute osseous abnormality. 6. Chondrocalcinosis about the knee. Electronically Signed   By: Andrea Gasman M.D.   On: 10/03/2023 19:57     Assessment/Plan Principal Problem:   Cellulitis Active Problems:   Adenocarcinoma of right lung, stage 4 (HCC)   COPD (chronic obstructive pulmonary disease) (HCC)   HFrEF (heart failure with reduced ejection  fraction) (HCC)   Macrocytic anemia     Cellulitis of the right lower extremity on empiric antibiotics follow cultures.  Aspiration of the right knee joint does not show any signs of septic joint.  Patient did receive fluid bolus since blood pressures are low normal.  Lactic acid was normal.  Will check ABI. Chronic HFrEF last EF measured in May 2025 was 40 to 45%.  Patient receiving fluids for low normal blood pressure.  Closely monitor respiratory status. Acute renal failure with hyponatremia and hypokalemia likely from recent use of Lasix .  Receiving fluids potassium replacement follow metabolic panel. COPD not actively wheezing will continue [employer]. Macrocytic anemia appears to be chronic.  Follow CBC.  Check B12 and folate levels. Non-small cell lung cancer being followed by Dr. Sherrod oncologist.  Due for chemotherapy next week. Depression on Wellbutrin  and Zoloft .  Confirmed with patient. Hypothyroidism on Synthroid . Hyperlipidemia on statins. Chronic pain on oxycodone  confirmed on PDMP website. GERD on Pepcid .  Since patient has significant cellulitis in the setting of immunocompromised will need close monitoring further workup and more than 2 midnight stay.   DVT prophylaxis: Lovenox . Code Status: Full code. Family Communication: Discussed with patient. Disposition Plan: Monitor bed. Consults called: None. Admission status: Inpatient.

## 2023-10-04 NOTE — Subjective & Objective (Signed)
 Pt seen and examined. Right leg is mildly painful. Wants to use RW instead of crutches. She is agreeable to being screened for [facility] program. Would prefer going home with IV abx instead of staying in the hospital.  Needs 1 unit PRBC transfusion per my discussion with her oncologist.

## 2023-10-04 NOTE — Assessment & Plan Note (Signed)
 10-04-2023 chronic. LVEF 40-45%. Will give her 20 mg IV lasix  after PRBC infusion.

## 2023-10-04 NOTE — Assessment & Plan Note (Signed)
 10-04-2023 s/p right arthrocentesis by EDP. Synovial fluid analysis negative for septic joint. Only 5780 WBC in joint fluid. +INTRACELLULAR CALCIUM  PYROPHOSPHATE CRYSTALS  consistent with pseudogout. Do not want to give her steroids given infection in right leg and recent chemo. Continue supportive care. She may be able to get some steroids after she completed abx.

## 2023-10-04 NOTE — Assessment & Plan Note (Addendum)
 10-04-2023 admitted with 2 days of worsening pain and redness of right ankle/foot after injury her right knee while trying to put together a dog crate at home. 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 Pt still with erythema, edema and pain in right ankle/lower leg. Pt is immunosuppressed after recent IV chemo about 3 weeks ago. Will need IV ABX for several more days. Pt is a potential candidate for [facility] program. She has good home support. Has Port-a-cath for IV access. Will get PT/OT to see patient today. Will need RW for home use. Pt will need 1 unit PRBC due to HgB of 7 g/dl. Discussed with her oncologist. Transitioned to Rocephin  and zyvox  for appropriate coverage 10/04/23.   **update. Pt is a candidate for [facility] program. IV abx changed to IV rocephin  2 gram daily and po Zyvox  600 mg bid. MRSA screen sent. Pt with strong DP pulses in both feet. I don't think she has ischemia. ABI canceled for her right foot.  10-04-2023

## 2023-10-04 NOTE — Plan of Care (Signed)
 Hospital at [facility] Plan of Care/Progress Note    BRITTIE WHISNANT   FMW:[id number]  DOB: July 19, 1965  DOA: 10/03/2023     0 Date of Service: 10/04/2023     Subjective:  Julie Nicholson is a 58 y.o. female with history of recurrent and metastatic non-small cell lung cancer on systemic chemotherapy with carboplatin  Alimta  and Keytruda  every 3 weeks next 1 due next week, chronic HFrEF last EF measured was May 2025 was 40 to 45%, COPD depression anemia presents to the ER for the second time in 24 hours with complaints of worsening right leg pain and swelling.  Patient had come to the ER a day ago when Dopplers were done and was negative.  Since then the pain has been worsening she presents back to the ER.  Patient stated symptoms started on Saturday morning 48 hours ago when she woke up from sleep when she noticed that her right leg was swollen and painful.  She has good sensation of the legs and also has no restriction of the function of the leg except for the pain.  Denies any trauma or insect bites.  Last couple of days patient has been using Lasix  to decrease the fluid in the right leg.   In the ER Dopplers were negative for DVT yesterday. Today CT scan was done which showed knee swelling with some enhancement concerning for possible infection of the joint swelling. There is also fluid collection around the lower leg extending to the thighs. Aspiration of the joint showed WBC of 5700 with neutrophils of 89%. No organisms seen. Cultures obtained started on empiric antibiotic for cellulitis. Patient blood pressure is in the low normal. Per record patient's blood pressure runs low according to the cardiology notes   The case was preliminary discussed Dr. Laurence who felt the patient was an appropriate candidate for hospital at home. After discussion with patient at the bedside, she is agreeable to go into the hospital at home program.  Hospital Problems Assessment and Plan: * Cellulitis of right  leg 10-04-2023 admitted with 2 days of worsening pain and redness of right ankle/foot after injury her right knee while trying to put together a dog crate at home. 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 Pt still with erythema, edema and pain in right ankle/lower leg. Pt is immunosuppressed after recent IV chemo about 3 weeks ago. Will need IV ABX for several more days. Pt is a potential candidate for Hospital at [facility] program. She has good home support. Has Port-a-cath for IV access. Will get PT/OT to see patient today. Will need RW for home use. Pt will need 1 unit PRBC due to HgB of 7 g/dl. Discussed with her oncologist. Transitioned to Rocephin  and zyvox  for appropriate coverage 10/04/23.   **update. Pt is a candidate for Hospital at Home program. IV abx changed to IV rocephin  2 gram daily and po Zyvox  600 mg bid. MRSA screen sent. Pt with strong DP pulses in both feet. I don't think she has ischemia. ABI canceled for her right foot.  10-04-2023              Pseudogout of right knee 10-04-2023 s/p right arthrocentesis by EDP. Synovial fluid analysis negative for septic joint. Only 5780 WBC in joint fluid. +INTRACELLULAR CALCIUM  PYROPHOSPHATE CRYSTALS  consistent with pseudogout. Do not want to give her steroids given infection in right leg and recent chemo. Continue supportive care. She may be able to get some steroids after she completed  abx.  Macrocytic anemia 10-04-2023 HgB 7.0 g/dl. Discussed with her primary oncologist who wanted her to have 1 unit PRBC. Have ordered this for ASAP transfusion.  HFrEF (heart failure with reduced ejection fraction) (HCC) 10-04-2023 chronic. LVEF 40-45%. Will give her 20 mg IV lasix  after PRBC infusion.  COPD (chronic obstructive pulmonary disease) (HCC) 10-04-2023 stable. Not wheezing. On RA.  Adenocarcinoma of right lung, stage 4 (HCC) 10-04-2023 has stage 4 lung cancer. F/u with oncology. Still undergoing active chemo.  Last chemo on 09-14-2023 with Keytruda  and Alimta .        Objective Vital signs were reviewed and unremarkable. Vitals:   10/04/23 1600 10/04/23 1623 10/04/23 1728 10/04/23 1900  BP: 100/60 104/75  (!) 99/58  Pulse:  80  98  Temp:   97.7 F (36.5 C) 97.8 F (36.6 C)  Resp:      Height:      Weight:      SpO2:   95% 97%  TempSrc:   Oral Oral  BMI (Calculated):        Exam Physical Exam HENT:     Head: Normocephalic.     Mouth/Throat:     Mouth: Mucous membranes are dry.  Eyes:     Pupils: Pupils are equal, round, and reactive to light.  Cardiovascular:     Rate and Rhythm: Normal rate and regular rhythm.  Skin:    Comments: See pictures  Neurological:     General: No focal deficit present.      Labs / Other Information There are no new results to review at this time. CT TIBIA FIBULA RIGHT W CONTRAST CLINICAL DATA:  Redness, swelling, pain.  EXAM: CT OF THE LOWER RIGHT EXTREMITY WITH CONTRAST  TECHNIQUE: Multidetector CT imaging of the lower right extremity was performed according to the standard protocol following intravenous contrast administration.  RADIATION DOSE REDUCTION: This exam was performed according to the departmental dose-optimization program which includes automated exposure control, adjustment of the mA and/or kV according to patient size and/or use of iterative reconstruction technique.  CONTRAST:  75mL OMNIPAQUE  IOHEXOL  350 MG/ML SOLN  COMPARISON:  None Available.  FINDINGS: Bones/Joint/Cartilage  Moderate joint effusion with synovial enhancement. There is a Engineer, production cyst tracking posteriorly. Moderate knee chondrocalcinosis. No fracture. No erosions or bony destructive change. No periostitis. The ankle mortise is preserved.  Ligaments  Suboptimally assessed by CT.  Muscles and Tendons  Tenosynovial fluid about the posterior tibial tendon at the ankle. Perifascial fluid about the posterior and anterior muscle compartments of  the calf, and to a lesser extent the posterior thigh muscle compartment. No organized or drainable fluid collection.  Soft tissues  Subcutaneous edema, more confluent near the ankle. No subcutaneous collection. The vascular structures appear patent. No soft tissue gas or radiopaque foreign body.  IMPRESSION: 1. Moderate joint effusion with synovial enhancement. Etiology is indeterminate, however infection is considered. Consider fluid sampling. 2. Perifascial fluid about the posterior and anterior muscle compartments of the calf, and to a lesser extent the posterior thigh. No organized or drainable fluid collection. 3. Tenosynovial fluid about the posterior tibial tendon at the ankle. 4. Subcutaneous edema, more confluent near the ankle. No subcutaneous collection. 5. No acute osseous abnormality. 6. Chondrocalcinosis about the knee.  Electronically Signed   By: Andrea Gasman M.D.   On: 10/03/2023 19:57 VAS US  LOWER EXTREMITY VENOUS (DVT) (ONLY [facility] & [facility])  Lower Venous DVT Study  Patient Name:  SUZEN JINNY HESS  Date of Exam:  10/01/2023 Medical Rec #: [id number]         Accession #:    [id number] Date of Birth: [birth year]         Patient Gender: F Patient Age:   64 years Exam Location:  [facility] Procedure:      VAS US  LOWER EXTREMITY VENOUS (DVT) Referring Phys: LONNI CAMP  --------------------------------------------------------------------------------   Indications: Muscle spasms, knee and posterior calf pain. Hit leg on dog crate yesterday, increased pain and swelling today.   Risk Factors: Cancer Metastatic stage IV non-small cell lung cancer. On palliative systemic chemotherapy and Limitations: Involuntary patient movement secondary to spasms and pain. Holding breath during spasms. Comparison Study: No prior study on file  Performing Technologist: Alberta Lis RVS    Examination Guidelines: A complete evaluation includes B-mode imaging, spectral  Doppler, color Doppler, and power Doppler as needed of all accessible portions of each vessel. Bilateral testing is considered an integral part of a complete examination. Limited examinations for reoccurring indications may be performed as noted. The reflux portion of the exam is performed with the patient in reverse Trendelenburg.     +---------+---------------+---------+-----------+----------+-------------------+ RIGHT    CompressibilityPhasicitySpontaneityPropertiesThrombus Aging      +---------+---------------+---------+-----------+----------+-------------------+ CFV      Full           Yes      No                                       +---------+---------------+---------+-----------+----------+-------------------+ SFJ      Full                                                             +---------+---------------+---------+-----------+----------+-------------------+ FV Prox  Full           Yes      Yes                                      +---------+---------------+---------+-----------+----------+-------------------+ FV Mid   Full                                                             +---------+---------------+---------+-----------+----------+-------------------+ FV DistalFull           Yes      Yes                                      +---------+---------------+---------+-----------+----------+-------------------+ PFV      Full           Yes      Yes                                      +---------+---------------+---------+-----------+----------+-------------------+ POP  Yes      Yes                  patent by color and                                                       Doppler             +---------+---------------+---------+-----------+----------+-------------------+ PTV      Full                                                              +---------+---------------+---------+-----------+----------+-------------------+ PERO     Full                                                             +---------+---------------+---------+-----------+----------+-------------------+        +----+---------------+---------+-----------+----------+--------------+ LEFTCompressibilityPhasicitySpontaneityPropertiesThrombus Aging +----+---------------+---------+-----------+----------+--------------+ CFV Full           Yes      Yes                                 +----+---------------+---------+-----------+----------+--------------+ SFJ Full                                                        +----+---------------+---------+-----------+----------+--------------+             Summary: RIGHT:  - There is no evidence of deep vein thrombosis in the lower extremity.   - No cystic structure found in the popliteal fossa.   LEFT: - No evidence of common femoral vein obstruction.       *See table(s) above for measurements and observations.  Electronically signed by Debby Robertson on 10/03/2023 at 11:27:25 AM.      Final    Lab Results  Component Value Date   WBC 2.7 (L) 10/04/2023   HGB 6.2 (LL) 10/04/2023   HCT 19.3 (L) 10/04/2023   MCV 104.9 (H) 10/04/2023   PLT 131 (L) 10/04/2023   Last metabolic panel Lab Results  Component Value Date   GLUCOSE 122 (H) 10/04/2023   NA 137 10/04/2023   K 3.3 (L) 10/04/2023   CL 104 10/04/2023   CO2 24 10/04/2023   BUN 8 10/04/2023   CREATININE 0.86 10/04/2023   GFRNONAA >60 10/04/2023   CALCIUM  8.6 (L) 10/04/2023   PROT 5.6 (L) 10/04/2023   ALBUMIN  2.2 (L) 10/04/2023   BILITOT 0.7 10/04/2023   ALKPHOS 103 10/04/2023   AST 38 10/04/2023   ALT 27 10/04/2023   ANIONGAP 9 10/04/2023     Hospital at Home Admission Criteria Checklist:  Formal consent explained in detail and signed at the bedside:yes Patient meets inpatient admission criteria (see  below for further details)  Is pt Medicare FFS/[employer] Medicare-Medicaid, [employer], [employer] ( required for initial launch with plan to expand)? yes Lives within 25 mil/ 30 min from [facility] within [place](pt may stay with family member during admission who lives within 25 miles or 30 min from [facility]in [facility]) ? yes  Hemodynamically stable with relatively low risk of clinical deterioration-not requiring ICU? yes Age >55?yes Does not require frequent touch-points or complex interventions/medications (ie Titrated Infusions (IV insulin , heparin  drips, vasoactive drips, use of infused or injectable controlled substances, patients on insulin )?  Any Behavioral Health comorbidities likely to increase risk for in-home care (ie Acute delirium or experiencing a marked altered mental status and cause is not a treatable condition in the home)? no  Has the patient been on BIPAP during course of ED evaluation or hospitalization?no IF YES, Has the patient been off of BIPAP for >24 hours(If NO-THEN PATIENT DOES MEET INCLUSION CRITERIA)?n/a Needs oxygen at home (<6L)? no Active safety concerns (ie Unable to use bedside commode independently and lacks caregiver support for safety- needs SNF placement, unable to obtain IV access)? no Has skin check been performed?pending Has Physical Therapy screened the patient?yes Common admission diagnoses including: CAP, COPD Exacerbation, Acute on chronic heart failure, Cellulitis, UTI , dehydration, acute resp failure with hypoxia (requiring <5L)   Social Screening: Denies significant ETOH intake? No etoh  Does not smoke and understands may not smoke in the presence of oxygen? No tobacco use  Patient states able to use iPad/phone for communication/has family who is able to use? yes  Patient has agreed to be compliant with medication and treatment regimen of the program?yes Any active drug use in patient or primary caregiver including daily dosing of  methadone?  no Stable home environment ( access to appropriate heating in cold conditions and/or appropriate air conditioning in hot conditions and/or no running water/electricity)? yes  No aggressive pets at home? no Firearm present  with ability or willingness to store them unloaded in a locked case for duration of hospitalization? no  Ambulatory? yes (independently cane, walker  wheelchair bound, bed bound) Bed bugs present on home evaluation?no  Family support system in place? yes Patient feels safe at home does not endorse any violence? yes Any actively decompensated behavioral healthy issues including agitation/aggressive behavior? no   Patient requests food to be provided by hospital home program?no PT/OT eval completed and not requiring SNF, ALF, inpatient rehab? yes   To be admitted to the Hospital at [facility] program, a patient generally must meet the following: 1. Requirement for Inpatient Level of Care: The patient's condition must necessitate an inpatient level of care. This is typically indicated by one or more of the following, depending on their specific diagnosis: -Persistent tachycardia despite appropriate treatment (e.g., for Heart Failure, UTI). - Persistent tachypnea (rapid breathing) or dyspnea (shortness of breath) that hasn't improved sufficiently with observation care (e.g., for Heart Failure, Pneumonia, Viral Illness, COVID). - Hypoxemia (low oxygen levels), such as a new need for oxygen, an increased need from baseline, or specific oxygen saturation levels (e.g., SpO2 <90-94% depending on the condition) that persist despite observation (e.g., for Heart Failure, COPD, Pneumonia, Viral Illness, COVID). - Need for Intravenous (IV) hydration due to an inability to maintain oral hydration, which persists despite observation care (e.g., for Cellulitis, UTI, Viral Illness, COVID). - Specific to Heart Failure: Persistent pulmonary edema, indicated by a new oxygen need, lack  of improvement with IV diuretics, and ongoing tachypnea/dyspnea. - Specific to COPD: A decrease  in known baseline resting oxygen saturation (SpO2) by 4% or more, or an increase in pre-existing supplemental oxygen requirements, which persists despite observation and requires continued close monitoring. - Specific to Pneumonia: A Pneumonia Severity Index (PSI) class IV (moderate risk). - Specific to Cellulitis: Failure of outpatient antibiotic therapy (indicated by progression or no improvement after a minimum of 48 hours on an adequate regimen) or a clinical presentation (like acuity or rapidity of progression) that requires the intensity of monitoring found in an inpatient setting. - Specific to UTI: Persistence or worsening of clinical findings like fever, pain, or dehydration despite observation care; presence of significant uropathy; suspected infection of an indwelling prosthetic device, stent, implant, or graft; or pregnancy with suspected pyelonephritis. 2. Appropriateness for Hospital at Home Setting: The patient's overall clinical picture, including the severity of their illness, their care needs, and their medical history and comorbidities, must be suitable for management in the Hospital at Home environment. This essentially means that none of the exclusion criteria (listed below) are met.  Unified Exclusion Criteria for Hospital at Home Admission: A patient would not be eligible for Hospital at Home if any of the following are present: - Hemodynamic Instability: - Hypotension (low blood pressure) is present. - Respiratory Instability or Needs Beyond Program Capability: - There is a new need for invasive or noninvasive ventilatory assistance (like BiPAP or a ventilator). - Oxygenation is not sufficient, generally indicated if an FiO2 (fraction of inspired oxygen) of 45% (which is about 6 Liters/minute via nasal cannula) or more is required to keep oxygen saturation (SpO2) at 90%  or greater. - Monitoring or Procedural Needs Beyond Program Capability: - There is a need for invasive monitoring, such as a pulmonary artery catheter or an arterial line. - There is a need for immediate-response telemetry monitoring (for dangerous arrhythmia detection and subsequent immediate intervention). - The required medication regimen is beyond the capabilities of Hospital at Home (e.g., dosing intervals are too frequent for home administration). - There is a need for a procedure that cannot be performed by the Hospital at [facility] team (e.g., significant wound debridement or abscess drainage for cellulitis, or percutaneous nephrostomy for a complicated UTI). - Significant Organ Dysfunction or Markers of Severe Illness: - Mental status is not at baseline, or there is altered mental status suggestive of inadequate perfusion. - Renal (kidney) function is unstable or showing an ongoing decline. - There is evidence of inadequate perfusion, such as metabolic acidosis or myocardial ischemia. - Uncompensated acidosis is present. - Condition-Specific Severity or Complications Making Home Care Unsuitable: -For Heart Failure: Known severe cardiac valvular disease (e.g., aortic stenosis, mitral regurgitation); or severe peripheral edema that impairs the ability to urinate or ambulate. -For COPD: Known concurrent comorbidity or finding that indicates a higher-risk COPD exacerbation (e.g., pulmonary fibrosis, cavitation, pleural effusion, pneumothorax, rib fracture). -For Pneumonia: Pneumonia Severity Index (PSI) class V (indicating high risk for inpatient mortality); known concurrent comorbidity or finding that indicates higher-risk pneumonia (e.g., pulmonary fibrosis, cavitation, large or loculated pleural effusion); or a concomitant serious infectious process like endocarditis or empyema. -For Cellulitis: Orbital, periorbital, or necrotizing infection is suspected; or a concomitant serious  infectious process like endocarditis, septic emboli, or septic joint space infection. -For UTI: Urinary tract obstruction (e.g., kidney stone, bladder outlet obstruction); or a concomitant serious infectious process like endocarditis or septic emboli. -For Viral Illness & COVID-19: A concomitant serious infectious process like endocarditis or empyema. General Comorbidities or Status: -The patient is significantly immunosuppressed (this applies  to Pneumonia, Cellulitis, UTI, Viral Illness, and COVID-19). -The patient meets inpatient admission criteria for a second diagnosis, or has care needs beyond the capabilities of Hospital at Home due to an active clinically significant comorbidity. (This is a general exclusion across all listed conditions)  Time spent: >60 minutes [facility] 10/04/2023, 4:57 PM

## 2023-10-04 NOTE — Progress Notes (Signed)
 Arrived to find the PT lying in her bed comfortably, no obvious distress is noted. Blood products were being administered at the time. PT stated that she was feeling better and denied being in any pain. PT has her port accessed.   Address that the PT will be staying at is [address] [place], [place] [address].   All phone numbers and emergency contacts were correct. Her preferred pharmacy is the [facility] that is noted.   PT is due for her chemo treatment on 10/05/23.

## 2023-10-04 NOTE — Assessment & Plan Note (Signed)
 10-04-2023 stable. Not wheezing. On RA.

## 2023-10-04 NOTE — Evaluation (Addendum)
 Physical Therapy Evaluation Patient Details Name: Julie Nicholson MRN: [id number] DOB: [birth year] Today's Date: 10/04/2023  History of Present Illness  Julie Nicholson is a 58 y.o. female who presented with 2 days of worsening RLE pain and swelling. CT: joint effusion with synovial enhancement; possibly infectious. PMHx:  recurrent and metastatic non-small cell lung cancer on systemic chemotherapy, chronic HFrEF last EF measured was May 2025 was 40 to 45%, COPD depression, anemia  Clinical Impression  Pt admitted with above diagnosis. Pt should progress well and be able to go home soon as she is CGA-Modif I with mobility. Pt needs youth RW and no f/u. Issued gait belt to pt.   Pt currently with functional limitations due to the deficits listed below (see PT Problem List). Pt will benefit from acute skilled PT to increase their independence and safety with mobility to allow discharge.           If plan is discharge home, recommend the following: Assistance with cooking/housework;Assist for transportation   Can travel by private vehicle        Equipment Recommendations Rolling walker (2 wheels) (needs youth RW)  Recommendations for Other Services       Functional Status Assessment Patient has had a recent decline in their functional status and demonstrates the ability to make significant improvements in function in a reasonable and predictable amount of time.     Precautions / Restrictions Precautions Precautions: Fall Recall of Precautions/Restrictions: Intact Restrictions Weight Bearing Restrictions Per Provider Order: Yes RLE Weight Bearing Per Provider Order: Weight bearing as tolerated      Mobility  Bed Mobility Overal bed mobility: Independent                  Transfers Overall transfer level: Needs assistance Equipment used: Rolling walker (2 wheels) Transfers: Sit to/from Stand Sit to Stand: Contact guard assist           General transfer comment: from  multiple surfaces with RW, pt maintained NWB most of session for pain management    Ambulation/Gait Ambulation/Gait assistance: Contact guard assist Gait Distance (Feet): 150 Feet Assistive device: Rolling walker (2 wheels) Gait Pattern/deviations: Step-to pattern, Decreased stance time - right, Decreased weight shift to right, Decreased dorsiflexion - right, Antalgic   Gait velocity interpretation: <1.31 ft/sec, indicative of household ambulator   General Gait Details: Pt was able to ambulate in hallway with RW with overall good sequencing of steps and RW. Pt had difficulty placing right foot on floor due to pain and either performed NWB or TTWB right LE.  Occasional cues for walker safety and not to step too far into RW however no LOB.  Stairs            Wheelchair Mobility     Tilt Bed    Modified Rankin (Stroke Patients Only)       Balance                                             Pertinent Vitals/Pain Pain Assessment Pain Assessment: 0-10 Pain Score: 7  Pain Location: RLE ankle>knee Pain Descriptors / Indicators: Discomfort, Grimacing Pain Intervention(s): Limited activity within patient's tolerance, Monitored during session, Repositioned, Premedicated before session    Home Living Family/patient expects to be discharged to:: Private residence Living Arrangements: Spouse/significant other;Other relatives (grandchild 29yo) Available Help at Discharge: Family;Available 24 hours/day  Type of Home: House Home Access: Stairs to enter   [employer] of Steps: 2   Home Layout: One level Home Equipment: Crutches      Prior Function Prior Level of Function : Independent/Modified Independent;Driving             Mobility Comments: has been using crutches for 2 days, 1 fall onto the couch with crutches - prior to LE pain, pt was indep without AD ADLs Comments: indep, managing well without assist since LE pain started      Extremity/Trunk Assessment   Upper Extremity Assessment Upper Extremity Assessment: Defer to OT evaluation    Lower Extremity Assessment Lower Extremity Assessment: RLE deficits/detail RLE: Unable to fully assess due to pain    Cervical / Trunk Assessment Cervical / Trunk Assessment: Normal  Communication   Communication Communication: No apparent difficulties    Cognition Arousal: Alert Behavior During Therapy: WFL for tasks assessed/performed   PT - Cognitive impairments: No apparent impairments                         Following commands: Intact       Cueing Cueing Techniques: Verbal cues     General Comments General comments (skin integrity, edema, etc.): VSS    Exercises General Exercises - Lower Extremity Ankle Circles/Pumps: AROM, Both, 5 reps, Supine Long Arc Quad: AROM, Both, 5 reps, Seated   Assessment/Plan    PT Assessment Patient needs continued PT services  PT Problem List Decreased activity tolerance;Decreased balance;Decreased mobility;Decreased knowledge of use of DME;Decreased safety awareness;Decreased knowledge of precautions;Pain       PT Treatment Interventions DME instruction;Gait training;Functional mobility training;Therapeutic activities;Therapeutic exercise;Balance training;Patient/family education    PT Goals (Current goals can be found in the Care Plan section)  Acute Rehab PT Goals Patient Stated Goal: to go home PT Goal Formulation: With patient Time For Goal Achievement: 10/18/23 Potential to Achieve Goals: Good    Frequency Min 2X/week     Co-evaluation               AM-PAC PT 6 Clicks Mobility  Outcome Measure Help needed turning from your back to your side while in a flat bed without using bedrails?: None Help needed moving from lying on your back to sitting on the side of a flat bed without using bedrails?: None Help needed moving to and from a bed to a chair (including a wheelchair)?: A  Little Help needed standing up from a chair using your arms (e.g., wheelchair or bedside chair)?: A Little Help needed to walk in hospital room?: A Little Help needed climbing 3-5 steps with a railing? : A Little 6 Click Score: 20    End of Session Equipment Utilized During Treatment: Gait belt Activity Tolerance: Patient limited by fatigue Patient left: in chair;with call bell/phone within reach Nurse Communication: Mobility status PT Visit Diagnosis: Muscle weakness (generalized) (M62.81)    Time: 9067-9053 PT Time Calculation (min) (ACUTE ONLY): 14 min   Charges:   PT Evaluation $PT Eval Low Complexity: 1 Low   PT General Charges $$ ACUTE PT VISIT: 1 Visit         Josey Dettmann M,PT Acute Rehab Services [phone]   Stephane JULIANNA Bevel 10/04/2023, 10:58 AM

## 2023-10-04 NOTE — Progress Notes (Signed)
 Pharmacy Antibiotic Note  Julie Nicholson is a 58 y.o. female admitted on 10/03/2023 with R. Lower extremity pain.  Pharmacy has been consulted for cefepime /vancomycin  dosing for cellulitis concerns.  -WBC 6, sCr 1.07 (bl~0.9-1), afebrile -Ceftriaxone /vanc x1 -CT: joint effusion with synovial enhancement; possibly infectious  -Blood/synovial fluid cultures collected  Plan: -Cefepime  2g IV every 12 hours  -Vancomycin  1g IV x1 -Vancomycin  500mg  IV every 24 hours (AUC 426, TBW, Vd 0.72, sCr 1.07) -Monitor renal function -Follow up signs of clinical improvement, LOT, de-escalation of antibiotics   Height: 5' 2 (157.5 cm) Weight: 44 kg (97 lb) IBW/kg (Calculated) : 50.1  Temp (24hrs), Avg:98.7 F (37.1 C), Min:97.9 F (36.6 C), Max:99.5 F (37.5 C)  Recent Labs  Lab 10/01/23 1538 10/03/23 1426 10/03/23 1430  WBC 4.8 6.1  --   CREATININE 0.98 1.07*  --   LATICACIDVEN 0.9  --  0.9    Estimated Creatinine Clearance: 39.3 mL/min (A) (by C-G formula based on SCr of 1.07 mg/dL (H)).    Allergies  Allergen Reactions   Carboplatin  Shortness Of Breath, Itching and Cough    See progress note from 06/16/22    Antimicrobials this admission: Cefepime  9/2 >>  Vancomycin  9/1 >>   Microbiology results: 9/1 BCx:  9/1 FluidCx:   Thank you for allowing pharmacy to be a part of this patient's care.  Lynwood Poplar, PharmD, BCPS Clinical Pharmacist 10/04/2023 1:30 AM

## 2023-10-04 NOTE — Progress Notes (Signed)
 1844 Patient transferred to hospital at home program. Daughter Ryland called 2x on [phone] left generic message no identifiers to call [facility] . Was transfused this afternoon through port with PRBCs. Per floor RN well tolerated. Team waiting for another CBC to be completed. Care management contacted for rolling walker for patient to use while on program and DME at discharge.   Domicile is not chart address patient is physically at [address] [place]. Information passed down to night RN.

## 2023-10-04 NOTE — Care Management (Signed)
 RNCM reached out to the patient this evening regarding the delivery of the rolling walker and provided an update on the estimated ETA, The patient was agreeable to the plan. A handoff was left for the daytime RNCM to follow up on any additional needs.

## 2023-10-04 NOTE — Evaluation (Addendum)
 Occupational Therapy Evaluation Patient Details Name: Julie Nicholson MRN: [id number] DOB: 11/22/1965 Today's Date: 10/04/2023   History of Present Illness   Julie Nicholson is a 58 y.o. female who presented with 2 days of worsening RLE pain and swelling. CT: joint effusion with synovial enhancement; possibly infectious. PMHx:  recurrent and metastatic non-small cell lung cancer on systemic chemotherapy, chronic HFrEF last EF measured was May 2025 was 40 to 45%, COPD depression, anemia     Clinical Impressions Julie Nicholson was evaluated s/p the above admission list. She is indep and lives with family at baseline. Upon evaluation, pt demonstrated mod I ability to complete mobility and ADLs with use of a RW. Pt maintained RLE NWB throughout for pain management, educated pt on WBAT and heel stretches to prevent contracture or tightening. Pt does not require further acute, or follow up OT services. Recommend discharge back to pt's environment with assist as needed. OT to sign off with appreciation of order, please re-consult if needed.       If plan is discharge home, recommend the following:   Assist for transportation;Assistance with cooking/housework     Functional Status Assessment   Patient has had a recent decline in their functional status and demonstrates the ability to make significant improvements in function in a reasonable and predictable amount of time.     Equipment Recommendations   Other (comment) (RW)     Precautions/Restrictions   Precautions Precautions: Fall Recall of Precautions/Restrictions: Intact Restrictions Weight Bearing Restrictions Per Provider Order: Yes RLE Weight Bearing Per Provider Order: Weight bearing as tolerated     Mobility Bed Mobility Overal bed mobility: Independent                  Transfers Overall transfer level: Modified independent                 General transfer comment: from multiple surfaces with RW, pt  maintained NWB throughout for pain management      Balance Overall balance assessment: No apparent balance deficits (not formally assessed)           ADL either performed or assessed with clinical judgement   ADL Overall ADL's : Modified independent         General ADL Comments: increased time for RLE management, pt maintained RLE NWB throughout with RW for pain management.     Vision Baseline Vision/History: 1 Wears glasses Vision Assessment?: No apparent visual deficits     Perception Perception: Not tested       Praxis         Pertinent Vitals/Pain Pain Assessment Pain Assessment: 0-10 Pain Score: 7  Pain Location: RLE ankle>knee Pain Descriptors / Indicators: Discomfort, Grimacing Pain Intervention(s): Limited activity within patient's tolerance, Monitored during session     Extremity/Trunk Assessment Upper Extremity Assessment Upper Extremity Assessment: Overall WFL for tasks assessed   Lower Extremity Assessment Lower Extremity Assessment: Defer to PT evaluation   Cervical / Trunk Assessment Cervical / Trunk Assessment: Normal   Communication Communication Communication: No apparent difficulties   Cognition Arousal: Alert Behavior During Therapy: WFL for tasks assessed/performed Cognition: No apparent impairments       Following commands: Intact       Cueing  General Comments   Cueing Techniques: Verbal cues  VSS           Home Living Family/patient expects to be discharged to:: Private residence Living Arrangements: Spouse/significant other;Other relatives (grandchild 74yo) Available Help at Discharge: Family;Available 24  hours/day Type of Home: House Home Access: Stairs to enter [employer] of Steps: 2   Home Layout: One level     Bathroom Shower/Tub: Chief Strategy Officer: Standard     Home Equipment: Crutches          Prior Functioning/Environment Prior Level of Function :  Independent/Modified Independent;Driving             Mobility Comments: has been using crutches for 2 days, 1 fall onto the couch with crutches - prior to LE pain, pt was indep without AD ADLs Comments: indep, managing well without assist since LE pain started    OT Problem List: Decreased range of motion;Decreased activity tolerance;Impaired balance (sitting and/or standing);Pain        OT Goals(Current goals can be found in the care plan section)   Acute Rehab OT Goals Patient Stated Goal: home asap OT Goal Formulation: With patient Time For Goal Achievement: 10/18/23 Potential to Achieve Goals: Good   AM-PAC OT 6 Clicks Daily Activity     Outcome Measure Help from another person eating meals?: None Help from another person taking care of personal grooming?: None Help from another person toileting, which includes using toliet, bedpan, or urinal?: None Help from another person bathing (including washing, rinsing, drying)?: None Help from another person to put on and taking off regular upper body clothing?: None Help from another person to put on and taking off regular lower body clothing?: None 6 Click Score: 24   End of Session Equipment Utilized During Treatment: Rolling walker (2 wheels) Nurse Communication: Mobility status  Activity Tolerance: Patient tolerated treatment well Patient left: in chair;with call bell/phone within reach  OT Visit Diagnosis: Unsteadiness on feet (R26.81);History of falling (Z91.81);Pain                Time: 9151-9094 OT Time Calculation (min): 17 min Charges:  OT General Charges $OT Visit: 1 Visit OT Evaluation $OT Eval Low Complexity: 1 Low  Lucie Kendall, OTR/L Acute Rehabilitation Services Office [phone] Secure Chat Communication Preferred   Lucie JONETTA Kendall 10/04/2023, 9:24 AM

## 2023-10-04 NOTE — Assessment & Plan Note (Signed)
 10-04-2023 HgB 7.0 g/dl. Discussed with her primary oncologist who wanted her to have 1 unit PRBC. Have ordered this for ASAP transfusion.

## 2023-10-04 NOTE — Hospital Course (Addendum)
 CC: right leg pain HPI: Julie Nicholson is a 58 y.o. female with history of recurrent and metastatic non-small cell lung cancer on systemic chemotherapy with carboplatin  Alimta  and Keytruda  every 3 weeks next 1 due next week, chronic HFrEF last EF measured was May 2025 was 40 to 45%, COPD depression anemia presents to the ER for the second time in 24 hours with complaints of worsening right leg pain and swelling.  Patient had come to the ER a day ago when Dopplers were done and was negative.  Since then the pain has been worsening she presents back to the ER.  Patient stated symptoms started on Saturday morning 48 hours ago when she woke up from sleep when she noticed that her right leg was swollen and painful.  She has good sensation of the legs and also has no restriction of the function of the leg except for the pain.  Denies any trauma or insect bites.  Last couple of days patient has been using Lasix  to decrease the fluid in the right leg.   ED Course: In the ER Dopplers were negative for DVT yesterday.  Today CT scan was done which showed knee swelling with some enhancement concerning for possible infection of the joint swelling.  There is also fluid collection around the lower leg extending to the thighs.  Aspiration of the joint showed WBC of 5700 with neutrophils of 89%.  No organisms seen.  Cultures obtained started on empiric antibiotic for cellulitis.  Patient blood pressure is in the low normal.  Per record patient's blood pressure runs low according to the cardiology notes  Significant Events: Admitted 10/03/2023 for right leg cellulitis 10-04-2023 pt screen and accepted for [facility]. ABX regimen changed to IV rocephin  2 gram daily and po Zyvox  600 mg bid.  Admission Labs: WBC 6.1, HgB 9.6, plt 228 Na 132, K 3.4, CO2 of 24, BUN 10, Scr 1.07, glu 132 T prot 7.8, alb 2.9, AST 41, ALT 34, alk phos 117, t. Bili 1.4 CRP 18.6 ESR 139 Synovial fluid analysis. WBC 5780,  intracellular calcium  pyrophosphate crystals.  Admission Imaging Studies: CT tib/fib Moderate joint effusion with synovial enhancement. Etiology is indeterminate, however infection is considered. Consider fluid sampling. 2. Perifascial fluid about the posterior and anterior muscle compartments of the calf, and to a lesser extent the posterior thigh. No organized or drainable fluid collection. 3. Tenosynovial fluid about the posterior tibial tendon at the ankle. 4. Subcutaneous edema, more confluent near the ankle. No subcutaneous collection. 5. No acute osseous abnormality. 6. Chondrocalcinosis about the knee.  Significant Labs:   Significant Imaging Studies:   Antibiotic Therapy: Anti-infectives (From admission, onward)    Start     Dose/Rate Route Frequency Ordered Stop   10/04/23 1500  vancomycin  (VANCOREADY) IVPB 500 mg/100 mL        500 mg 100 mL/hr over 60 Minutes Intravenous Every 24 hours 10/04/23 0134     10/04/23 0500  ceFEPIme  (MAXIPIME ) 2 g in sodium chloride  0.9 % 100 mL IVPB        2 g 200 mL/hr over 30 Minutes Intravenous Every 12 hours 10/04/23 0134     10/03/23 1445  cefTRIAXone  (ROCEPHIN ) 2 g in sodium chloride  0.9 % 100 mL IVPB        2 g 200 mL/hr over 30 Minutes Intravenous  Once 10/03/23 1443 10/03/23 1616   10/03/23 1445  vancomycin  (VANCOCIN ) IVPB 1000 mg/200 mL premix  1,000 mg 200 mL/hr over 60 Minutes Intravenous  Once 10/03/23 1443 10/03/23 1730       Procedures: Right knee arthrocentesis PRBC transfusion x 1 unit  Consultants:

## 2023-10-04 NOTE — Progress Notes (Signed)
 Virtual visit with Julie Nicholson and Paramedic, Elouise. She is in no distress. No c/o pain at this time. WE discussed plan for medications tonight and encouraged her to call with any concerns.

## 2023-10-05 ENCOUNTER — Telehealth: Payer: Self-pay | Admitting: Medical Oncology

## 2023-10-05 LAB — CBC WITH DIFFERENTIAL/PLATELET
Abs Immature Granulocytes: 0.01 K/uL (ref 0.00–0.07)
Basophils Absolute: 0 K/uL (ref 0.0–0.1)
Basophils Relative: 0 %
Eosinophils Absolute: 0 K/uL (ref 0.0–0.5)
Eosinophils Relative: 1 %
HCT: 26.7 % — ABNORMAL LOW (ref 36.0–46.0)
Hemoglobin: 8.7 g/dL — ABNORMAL LOW (ref 12.0–15.0)
Immature Granulocytes: 0 %
Lymphocytes Relative: 13 %
Lymphs Abs: 0.4 K/uL — ABNORMAL LOW (ref 0.7–4.0)
MCH: 33 pg (ref 26.0–34.0)
MCHC: 32.6 g/dL (ref 30.0–36.0)
MCV: 101.1 fL — ABNORMAL HIGH (ref 80.0–100.0)
Monocytes Absolute: 0.4 K/uL (ref 0.1–1.0)
Monocytes Relative: 13 %
Neutro Abs: 2.2 K/uL (ref 1.7–7.7)
Neutrophils Relative %: 73 %
Platelets: 206 K/uL (ref 150–400)
RBC: 2.64 MIL/uL — ABNORMAL LOW (ref 3.87–5.11)
RDW: 16.2 % — ABNORMAL HIGH (ref 11.5–15.5)
WBC: 2.9 K/uL — ABNORMAL LOW (ref 4.0–10.5)
nRBC: 0 % (ref 0.0–0.2)

## 2023-10-05 LAB — COMPREHENSIVE METABOLIC PANEL WITH GFR
ALT: 82 U/L — ABNORMAL HIGH (ref 0–44)
AST: 163 U/L — ABNORMAL HIGH (ref 15–41)
Albumin: 2.3 g/dL — ABNORMAL LOW (ref 3.5–5.0)
Alkaline Phosphatase: 182 U/L — ABNORMAL HIGH (ref 38–126)
Anion gap: 11 (ref 5–15)
BUN: 9 mg/dL (ref 6–20)
CO2: 22 mmol/L (ref 22–32)
Calcium: 8.8 mg/dL — ABNORMAL LOW (ref 8.9–10.3)
Chloride: 101 mmol/L (ref 98–111)
Creatinine, Ser: 0.96 mg/dL (ref 0.44–1.00)
GFR, Estimated: >60 mL/min (ref >60)
Glucose, Bld: 122 mg/dL — ABNORMAL HIGH (ref 70–99)
Potassium: 3.7 mmol/L (ref 3.5–5.1)
Sodium: 134 mmol/L — ABNORMAL LOW (ref 135–145)
Total Bilirubin: 0.7 mg/dL (ref 0.0–1.2)
Total Protein: 6.2 g/dL — ABNORMAL LOW (ref 6.5–8.1)

## 2023-10-05 LAB — TYPE AND SCREEN
ABO/RH(D): B POS
Antibody Screen: NEGATIVE
Unit division: 0

## 2023-10-05 LAB — BPAM RBC
Blood Product Expiration Date: 202510022359
ISSUE DATE / TIME: 202509021253
Unit Type and Rh: 7300

## 2023-10-05 LAB — GLUCOSE, BODY FLUID OTHER: Glucose, Body Fluid Other: 78 mg/dL

## 2023-10-05 LAB — MAGNESIUM: Magnesium: 1.6 mg/dL — ABNORMAL LOW (ref 1.7–2.4)

## 2023-10-05 MED ORDER — MAGNESIUM SULFATE 2 GM/50ML IV SOLN
2.0000 g | Freq: Once | INTRAVENOUS | Status: AC
  Administered 2023-10-05: 2 g via INTRAVENOUS
  Filled 2023-10-05: qty 50

## 2023-10-05 MED ORDER — FUROSEMIDE 20 MG PO TABS
20.0000 mg | ORAL_TABLET | Freq: Every day | ORAL | Status: DC | PRN
  Filled 2023-10-05: qty 1

## 2023-10-05 NOTE — TOC Progression Note (Signed)
 Transition of Care [facility]) - Progression Note    Patient Details  Name: Julie Nicholson MRN: [id number] Date of Birth: Feb 11, 1965  Transition of Care [facility] CM/SW Contact  Corean JAYSON Canary, RN Phone Number: 10/05/2023, 11:23 AM  Clinical Narrative:    Patient expected DC date on Friday. Confirmed with [employer] that walker was delivered yesterday .  No other needs identified, IP Care Management will follow while at Hospital at home program   Expected Discharge Plan: Home/Self Care Barriers to Discharge: Continued Medical Work up               Expected Discharge Plan and Services   Discharge Planning Services: CM Consult                     DME Arranged: Vannie DME Agency: [employer] Date DME Agency Contacted: 10/04/23   Representative spoke with at DME Agency: Arthea             Social Drivers of Health (SDOH) Interventions SDOH Screenings   Food Insecurity: No Food Insecurity (10/04/2023)  Housing: Low Risk  (10/04/2023)  Transportation Needs: No Transportation Needs (10/04/2023)  Utilities: At Risk (10/04/2023)  Depression (PHQ2-9): Low Risk  (09/14/2023)  Tobacco Use: Medium Risk (10/04/2023)    Readmission Risk Interventions     No data to display

## 2023-10-05 NOTE — Progress Notes (Signed)
 Hospital at Home Daily Progress Note   Patient: ELYSSE Nicholson  MRN: [id number]  DOB: Jun 08, 1965  DOA: 10/03/2023  DOS: the patient was seen and examined on @TODAY @    Patient identified themself as Julie Nicholson  DOB 15-Nov-1965  Medic Norman Miner present at bedside during encounter and performed the physical exam. Patient was seen virtually today via video chat; my physical location [place], [place]   Brief hospital course: HPI from admission to [facility]:  Julie Nicholson is a 58 y.o. female with history of recurrent and metastatic non-small cell lung cancer on systemic chemotherapy with carboplatin  Alimta  and Keytruda  every 3 weeks next 1 due next week, chronic HFrEF last EF measured was May 2025 was 40 to 45%, COPD depression anemia presents to the ER for the second time in 24 hours with complaints of worsening right leg pain and swelling.  Patient had come to the ER a day ago when Dopplers were done and was negative.  Since then the pain has been worsening she presents back to the ER.  Patient stated symptoms started on Saturday morning 48 hours ago when she woke up from sleep when she noticed that her right leg was swollen and painful.  She has good sensation of the legs and also has no restriction of the function of the leg except for the pain.  Denies any trauma or insect bites.  Last couple of days patient has been using Lasix  to decrease the fluid in the right leg.    In the ER Dopplers were negative for DVT yesterday. Today CT scan was done which showed knee swelling with some enhancement concerning for possible infection of the joint swelling. There is also fluid collection around the lower leg extending to the thighs. Aspiration of the joint showed WBC of 5700 with neutrophils of 89%. No organisms seen. Cultures obtained started on empiric antibiotic for cellulitis. Patient blood pressure is in the low normal. Per record patient's blood pressure runs low according to the cardiology notes     The case was preliminary discussed Dr. Laurence who felt the patient was an appropriate candidate for hospital at home. After discussion with patient at the bedside, she is agreeable to go into the hospital at home program.   Patient is being treated with IV antibiotics for right lower extremity cellulitis.   Further hospital course and management as outlined below.    Assessment and Plan:   Cellulitis of right leg Admitted to [facility] program on 10/04/23. Presented with 2 days of worsening pain and redness of right ankle/foot after injury her right knee while trying to put together a dog crate at home.  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  Immunosuppressed status warrants multiple days of IV antibiotics. --Continue IV Rocephin , PO Linezolid  given bioequivalent of this drug --Elevate right leg when at rest --Monitor erythema, warmth, tenderness on exam --Monitor fever curve, CBC --ABI's were deferred given strong symmetric pedal pulses on exam, making ischemic/PAD unlikely   Pseudogout of right knee s/p right arthrocentesis by EDP.  Synovial fluid analysis negative for septic joint.  Only 5780 WBC in joint fluid. +INTRACELLULAR CALCIUM  PYROPHOSPHATE CRYSTALS consistent with pseudogout.  --Avoiding steroids given cellulitis and recent chemo, will need to avoid further suppressive immune system.D --Continue supportive care, pain control PRN --Rolling walker recommended by PT for ambulation, delivered to home on admission --Can consider steroids after completing antibiotics  Hypomagnesemia - Mg 1.6 --IV Mg sulfate to replace --Monitor Mg level,  replace PRN  Elevated LFTs - seems like a transient intermittent issues over past several months based on chart review.  ?due to chemotherapy.  No abdominal symptoms reported. --Repeat LFT's tomorrow & further evaluation if needed or if symptoms reported - to include hepatitis panel and RUQ U/S   Macrocytic  anemia 10-04-2023 HgB 7.0 g/dl. Dr. Eldonna discussed with her primary oncologist who wanted her to have 1 unit PRBC's transfused - completed. Hbg improved to 8.7 --Monitor CBC   Chronic HFrEF  10-04-2023 chronic. LVEF 40-45%.  Given 20 mg IV lasix  after PRBC infusion. --Monitor volume status closely --Continue home Lasix  20 mg daily PRN for edema/swelling   COPD stable. Not wheezing. On RA. Takes Stiolto --Continue formulary equivalent of Brovana  + Incruse --PRN albuterol  nebs   Adenocarcinoma of right lung, stage 4 (HCC) 10-04-2023 has stage 4 lung cancer. F/u with oncology. Still undergoing active chemo. Last chemo on 09-14-2023 with Keytruda  and Alimta .      Patient Active Problem List   Diagnosis Date Noted   Cellulitis of right leg 10/03/2023    Priority: High   Macrocytic anemia 10/04/2023    Priority: Medium    Pseudogout of right knee 10/04/2023    Priority: Medium    HFrEF (heart failure with reduced ejection fraction) (HCC) 07/13/2023    Priority: Low   COPD (chronic obstructive pulmonary disease) (HCC) 05/08/2021    Priority: Low   Adenocarcinoma of right lung, stage 4 (HCC) 12/18/2020    Priority: Low   Former smoker 07/20/2023   Abdominal discomfort    Cancer (HCC)    Chronic headaches    Chronic venous hypertension (idiopathic) with other complications of bilateral lower extremity    Deviated nasal septum    Eustachian tube dysfunction    Hearing loss    High cholesterol    History of radiation therapy    Migraine    Aortic atherosclerosis (HCC) 06/24/2023   Pancytopenia (HCC) 06/23/2023   Constipation 06/23/2023   Metastatic non-small cell lung cancer (HCC) 06/23/2023   Port-A-Cath in place 07/14/2022   Neutropenia (HCC) 04/19/2022   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  She reports overall feeling better.  Still having pain and some swelling in the right leg.  No fever/chills.  Still moderate pain in right leg, 7/10 recently. Ongoing poor appetite.  She reports leg swelling had improved overnight but currently seems a little bit increased.  Reports her mother is admitted at [facility], needed an amputation, and patient will be her primary caregiver when discharged from the hospital.     Physical Exam:    10/04/2023   10:00 PM 10/04/2023    7:00 PM 10/04/2023    4:23 PM  Vitals with BMI  Systolic 117 99 104  Diastolic 62 58 75  Pulse 110 98 80   General exam: awake, alert, no acute distress Respiratory system: CTAB, no wheezes, rales or rhonchi, normal respiratory effort. Cardiovascular system: normal S1/S2, RRR, Gastrointestinal system: soft, NT, ND Central nervous  system: A&O x 3. no gross focal neurologic deficits, normal speech Extremities: SEE IMAGES for RLE cellulitis Psychiatry: normal mood, congruent affect, judgement and insight appear normal  IMAGES TAKEN 10/04/23:         Data Reviewed:  Notable labs --  Na 134 Glucose 122 Ca 8.8 Mg 1.6  Alk phos 103 >> 182 AST 38 >> 163 ALT 27 >> 82 Normal T bili    Disposition: Status is: Inpatient Remains inpatient appropriate because: remains on IV antibiotics pending further clinical improvement given immunocompromised status    Planned Discharge Destination: Home    Time spent: 45 minutes  Author: Burnard DELENA Cunning, DO [facility]  09/22/2023 7:00 AM  For on call review [email or web address].

## 2023-10-05 NOTE — Progress Notes (Signed)
 Virtual visit with Julie Nicholson. C/o pain and swelling of her leg tonight. Physically active today. Pain medication and other scheduled medications administered. She states she sleeps laying down with 2 pillows under leg. Advised compression stockings and as needed lasix  in the morning. Spoke with her concerning hypomagnesia and need to follow up with PCP. Discussed that her mother is admitted to the hospital and that has created and added stressor to her. Encouraged to call with any concerns or questions that arise.

## 2023-10-05 NOTE — Telephone Encounter (Signed)
 Receiving home IV antibiotics for R leg cellulitis. Appts cancelled for tomorrow. I told pt to keep her appts as scheduled 09/24.

## 2023-10-05 NOTE — Progress Notes (Signed)
 Communicated with the patient via video tablet. AAOx4. She reports 7/10 right leg pain. Patient was witnessed giving herself prn oxycodone . Denies headache, dizziness or SOB. Plan of care reviewed with patient.  Patient mentioned she took oxycodone  10mg  last night around 2300 without supervision. Patient was reminded to call the virtual nurse first  so staff can witness her taking the medication. Pt agreeable to plan.

## 2023-10-05 NOTE — Progress Notes (Signed)
 This is for the patients PM visit .  The patient is found in the recliner, sitting upright, located in the living room.    The patient is alert and oriented. Skin is warm dry and pink. Cap refill brisk.   Medication administered, wound care was carried our (per order), and the patients vitals were assessed.   No other complaints or concerns at this time. Antibiotics will be given by the overnight paramedic due to premature timing for administration.

## 2023-10-05 NOTE — Plan of Care (Signed)

## 2023-10-06 ENCOUNTER — Inpatient Hospital Stay

## 2023-10-06 ENCOUNTER — Inpatient Hospital Stay: Admitting: Physician Assistant

## 2023-10-06 LAB — COMPREHENSIVE METABOLIC PANEL WITH GFR
ALT: 61 U/L — ABNORMAL HIGH (ref 0–44)
AST: 75 U/L — ABNORMAL HIGH (ref 15–41)
Albumin: 2.2 g/dL — ABNORMAL LOW (ref 3.5–5.0)
Alkaline Phosphatase: 188 U/L — ABNORMAL HIGH (ref 38–126)
Anion gap: 13 (ref 5–15)
BUN: 7 mg/dL (ref 6–20)
CO2: 25 mmol/L (ref 22–32)
Calcium: 9.1 mg/dL (ref 8.9–10.3)
Chloride: 103 mmol/L (ref 98–111)
Creatinine, Ser: 1.01 mg/dL — ABNORMAL HIGH (ref 0.44–1.00)
GFR, Estimated: >60 mL/min (ref >60)
Glucose, Bld: 130 mg/dL — ABNORMAL HIGH (ref 70–99)
Potassium: 3.6 mmol/L (ref 3.5–5.1)
Sodium: 141 mmol/L (ref 135–145)
Total Bilirubin: 0.5 mg/dL (ref 0.0–1.2)
Total Protein: 6.4 g/dL — ABNORMAL LOW (ref 6.5–8.1)

## 2023-10-06 LAB — CBC
HCT: 27.3 % — ABNORMAL LOW (ref 36.0–46.0)
Hemoglobin: 9.1 g/dL — ABNORMAL LOW (ref 12.0–15.0)
MCH: 33.6 pg (ref 26.0–34.0)
MCHC: 33.3 g/dL (ref 30.0–36.0)
MCV: 100.7 fL — ABNORMAL HIGH (ref 80.0–100.0)
Platelets: 217 K/uL (ref 150–400)
RBC: 2.71 MIL/uL — ABNORMAL LOW (ref 3.87–5.11)
RDW: 15.9 % — ABNORMAL HIGH (ref 11.5–15.5)
WBC: 2.9 K/uL — ABNORMAL LOW (ref 4.0–10.5)
nRBC: 0 % (ref 0.0–0.2)

## 2023-10-06 LAB — MAGNESIUM: Magnesium: 1.8 mg/dL (ref 1.7–2.4)

## 2023-10-06 NOTE — Progress Notes (Signed)
 Hospital at Home Daily Progress Note   Patient: Julie Nicholson  MRN: [id number]  DOB: 1965/04/08  DOA: 10/03/2023  DOS: the patient was seen and examined on @TODAY @    Patient identified themself as Julie Nicholson  DOB [birth year]  Medic Julie Nicholson present at bedside during encounter and performed the physical exam. Patient was seen virtually today via video chat; my physical location [place], [place]   Brief hospital course: HPI from admission to [facility]:  Julie Nicholson is a 58 y.o. female with history of recurrent and metastatic non-small cell lung cancer on systemic chemotherapy with carboplatin  Alimta  and Keytruda  every 3 weeks next 1 due next week, chronic HFrEF last EF measured was May 2025 was 40 to 45%, COPD depression anemia presents to the ER for the second time in 24 hours with complaints of worsening right leg pain and swelling.  Patient had come to the ER a day ago when Dopplers were done and was negative.  Since then the pain has been worsening she presents back to the ER.  Patient stated symptoms started on Saturday morning 48 hours ago when she woke up from sleep when she noticed that her right leg was swollen and painful.  She has good sensation of the legs and also has no restriction of the function of the leg except for the pain.  Denies any trauma or insect bites.  Last couple of days patient has been using Lasix  to decrease the fluid in the right leg.    In the ER Dopplers were negative for DVT yesterday. Today CT scan was done which showed knee swelling with some enhancement concerning for possible infection of the joint swelling. There is also fluid collection around the lower leg extending to the thighs. Aspiration of the joint showed WBC of 5700 with neutrophils of 89%. No organisms seen. Cultures obtained started on empiric antibiotic for cellulitis. Patient blood pressure is in the low normal. Per record patient's blood pressure runs low according to the cardiology notes     The case was preliminary discussed Dr. Laurence who felt the patient was an appropriate candidate for hospital at home. After discussion with patient at the bedside, she is agreeable to go into the hospital at home program.   Patient is being treated with IV antibiotics for right lower extremity cellulitis.   Further hospital course and management as outlined below.    Assessment and Plan:   Cellulitis of right leg Admitted to [facility] program on 10/04/23. Presented with 2 days of worsening pain and redness of right ankle/foot after injury her right knee while trying to put together a dog crate at home.  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  Immunosuppressed status warrants multiple days of IV antibiotics. --Continue IV Rocephin , PO Linezolid  given bioequivalent of this drug --Elevate right leg when at rest --Monitor erythema, warmth, tenderness on exam --Monitor fever curve, CBC --ABI's were deferred given strong symmetric pedal pulses on exam, making ischemic/PAD unlikely   Pseudogout of right knee s/p right arthrocentesis by EDP.  Synovial fluid analysis negative for septic joint.  Only 5780 WBC in joint fluid. +INTRACELLULAR CALCIUM  PYROPHOSPHATE CRYSTALS consistent with pseudogout.  --Avoiding steroids given cellulitis and recent chemo, will need to avoid further suppressive immune system.D --Continue supportive care, pain control PRN --Rolling walker recommended by PT for ambulation, delivered to home on admission --Can consider steroids after completing antibiotics  Hypomagnesemia - Mg 1.6 on 9/3 was replaced. Mg 1.8 today --Monitor  Mg level, replace PRN  Elevated LFTs - noted on 9/3.  Seems like a transient intermittent issues over past several months based on chart review.  ?due to chemotherapy.  No abdominal symptoms reported. 9/4 - LFT's improving. Pt denies abdominal pain or N/V --Repeat LFT's tomorrow & further evaluation if needed or if  symptoms reported - to include hepatitis panel and RUQ U/S   Macrocytic anemia 10-04-2023 HgB 7.0 g/dl. Dr. Eldonna discussed with her primary oncologist who wanted her to have 1 unit PRBC's transfused - completed. Hbg improved to 8.7 >> 9.1 today --Monitor CBC   Chronic HFrEF  10-04-2023 chronic. LVEF 40-45%.  Given 20 mg IV lasix  after PRBC infusion. --Monitor volume status closely --Continue home Lasix  20 mg daily PRN for edema/swelling   COPD stable. Not wheezing. On RA. Takes Stiolto --Continue formulary equivalent of Brovana  + Incruse --PRN albuterol  nebs   Adenocarcinoma of right lung, stage 4 (HCC) 10-04-2023 has stage 4 lung cancer. F/u with oncology. Still undergoing active chemo. Last chemo on 09-14-2023 with Keytruda  and Alimta .      Patient Active Problem List   Diagnosis Date Noted   Cellulitis of right leg 10/03/2023    Priority: High   Macrocytic anemia 10/04/2023    Priority: Medium    Pseudogout of right knee 10/04/2023    Priority: Medium    HFrEF (heart failure with reduced ejection fraction) (HCC) 07/13/2023    Priority: Low   COPD (chronic obstructive pulmonary disease) (HCC) 05/08/2021    Priority: Low   Adenocarcinoma of right lung, stage 4 (HCC) 12/18/2020    Priority: Low   Former smoker 07/20/2023   Abdominal discomfort    Cancer (HCC)    Chronic headaches    Chronic venous hypertension (idiopathic) with other complications of bilateral lower extremity    Deviated nasal septum    Eustachian tube dysfunction    Hearing loss    High cholesterol    History of radiation therapy    Migraine    Aortic atherosclerosis (HCC) 06/24/2023   Pancytopenia (HCC) 06/23/2023   Constipation 06/23/2023   Metastatic non-small cell lung cancer (HCC) 06/23/2023   Port-A-Cath in place 07/14/2022   Neutropenia (HCC) 04/19/2022   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  Pt reports ongoing pain and swelling but overall improving since admission.  No fever/chills.  Slept better last night than recent nights.  Pt states not having to use the cane or walker with ambulation today, better able to tolerate weight bearing on the foot.  No other acute complaints including no abdominal pain or nausea/vomiting.   Physical Exam:    10/06/2023   11:00 AM 10/05/2023   12:00 PM 10/05/2023    9:42 AM  Vitals with BMI  Weight   103 lbs 3 oz  BMI   18.87  Systolic 114 111   Diastolic 66 65   Pulse 68 106    General exam: awake, alert, no acute distress Respiratory system: on room air, normal respiratory effort.  Cardiovascular system: RRR, pedal edema of right foot as shown below Central nervous system: A&O x 3. no gross focal neurologic deficits, normal speech Extremities: SEE IMAGES for RLE cellulitis, right ankle and pedal edema with pitting from compression stocking Psychiatry: normal mood, congruent affect, judgement and insight appear normal  IMAGES TAKEN TODAY 9/4:            Data Reviewed:  Notable labs --   Glucose 130 Cr 1.01  Alk phos 103 >> 182 >> 188 AST 38 >> 163 >> 75  ALT 27 >> 82 >> 61 Normal T bili   WBC 2.9 Hbg 8.7 >> 9.1    Disposition: Status is: Inpatient Remains inpatient appropriate because: remains on IV antibiotics pending further clinical improvement given immunocompromised status    Planned Discharge Destination: Home    Time spent: 45 minutes  Author: Burnard DELENA Cunning, DO [facility]  09/22/2023 7:00 AM  For on call review [email or web address].

## 2023-10-06 NOTE — Progress Notes (Signed)
 After pain medication administration, there are 3 pills left in supply.

## 2023-10-06 NOTE — Progress Notes (Signed)
 Virtual visit with Julie Nicholson. She has been very stressed with coordinating care for her mother that is in the hospital. She endorses moderate pain of the leg, a 6 out of 10. I encouraged her to take pain medication prior to bed and get a good night's rest. Other medications administered. She has no other complaints or concerns at this time. We discussed the plan for overnight and tomorrow. D/c is planned. I provided her with information on local compression stockings at a reasonable price. Encouraged to call with any concerns.

## 2023-10-06 NOTE — Progress Notes (Signed)
 Saw the patient for the PM visit.   Found the patient sitting on the couch, located in the living area. No signs of acute distress noted. The patient reports pain is stable from earlier.   Physical exam is unchanged from previous with no complaints at this time.   Vitals taken, noted 10 point delta between BP cuff  ( auto) and manual pulse check. The patients radial pulse was 107.   No virtual visit was required. Patient voices no additional needs during this time.

## 2023-10-06 NOTE — Progress Notes (Signed)
 Follow up call for pain reassessment . Pain level now 4/10. Pt voices understanding of POC and visit this pm by paramedic

## 2023-10-06 NOTE — Progress Notes (Signed)
 Attempted to video conference with PT but pt unavailable at 330 and 345. She is discussing insurance for her mother (recent amputation). . PT to assess tomorrow

## 2023-10-06 NOTE — Progress Notes (Deleted)
 This is for the patients AM visit.   Upon arrival, found the patient is sitting in the recliner in no signs of acute distress. ABCs are intact. The patient is Aox4.   The patients physical exam is unremarkable for new or worsening findings. Lung sounds are clear, heart tone are normal without mumurs. Skin is warm, dry and pink.   Assessment of the right leg shows no appreciated change in presentation from yesterdays PM visit. The patient did voice pain throughout the night and thought it may have been to the bandage being to tight. After further discussion, the patient denied any tingling in the extremity, color change or any other concerning complaints. The patient did remove the bandage and did feel some relief.   EMTs assisted with prescribed wound care orders. Patient tolerated well.   Vitals were taken, medications were administered, virtual visit facilitate without incident.   The patient had no further concerns or question prior to leaving the visit.

## 2023-10-06 NOTE — Progress Notes (Signed)
 Completed virtual rounds with MD,paramedic at patient bedside. POC reviewed and discussed ,patient voices understanding and agreement. Pt reminded to call RN for any needs, RN and MD available at all times. Pt voices understanding. Pt aware of next planned visit and next call from RN.

## 2023-10-06 NOTE — Progress Notes (Signed)
 PT Cancellation Note  Patient Details Name: [name] MRN: [id number] DOB: 16-Nov-1965   Cancelled Treatment:    Reason Eval/Treat Not Completed: Other (comment)  Patient unavailable after 2 follow-up attempts this afternoon via virtual nurse link. On phone with urgent family issues.  Will attempt again tomorrow schedule permitting.  Of note, pt relayed to RN that she is now able to ambulate without her walker. A significant improvement from evaluation note where pt was unable to bear much if any weight through her RLE.  Please do not hesitate to reach out for any questions or concerns.   Leontine Roads, PT, DPT [facility]  Rehabilitation Services Physical Therapist Office: [phone] Website: [email or web address]  Leontine GORMAN Roads 10/06/2023, 3:53 PM

## 2023-10-07 ENCOUNTER — Other Ambulatory Visit: Payer: Self-pay | Admitting: Physician Assistant

## 2023-10-07 ENCOUNTER — Encounter: Payer: Self-pay | Admitting: Internal Medicine

## 2023-10-07 DIAGNOSIS — L03115 Cellulitis of right lower limb: Secondary | ICD-10-CM | POA: Diagnosis not present

## 2023-10-07 LAB — BODY FLUID CULTURE W GRAM STAIN: Culture: NO GROWTH

## 2023-10-07 MED ORDER — LINEZOLID 600 MG PO TABS: 600.0000 mg | ORAL_TABLET | Freq: Two times a day (BID) | ORAL | 0 refills | Status: DC

## 2023-10-07 MED ORDER — AMOXICILLIN-POT CLAVULANATE 875-125 MG PO TABS: 1.0000 | ORAL_TABLET | Freq: Two times a day (BID) | ORAL | 0 refills | Status: DC

## 2023-10-07 NOTE — Plan of Care (Signed)

## 2023-10-07 NOTE — TOC Transition Note (Signed)
 Transition of Care [facility]) - Discharge Note   Patient Details  Name: Julie Nicholson MRN: [id number] Date of Birth: 12-23-1965  Transition of Care [facility]) CM/SW Contact:  Corean JAYSON Canary, RN Phone Number: 10/07/2023, 8:38 AM   Clinical Narrative:     Patient is being discharged this afternoon from hospital at home. A walker was delivered to her home from [employer]. NO PT follow up or other needs identified  Final next level of care: Home/Self Care Barriers to Discharge: No Barriers Identified   Patient Goals and CMS Choice            Discharge Placement                       Discharge Plan and Services Additional resources added to the After Visit Summary for     Discharge Planning Services: CM Consult            DME Arranged: Vannie DME Agency: [employer] Date DME Agency Contacted: 10/04/23   Representative spoke with at DME Agency: [place]            Social Drivers of Health (SDOH) Interventions SDOH Screenings   Food Insecurity: No Food Insecurity (10/04/2023)  Housing: Low Risk  (10/04/2023)  Transportation Needs: No Transportation Needs (10/04/2023)  Utilities: At Risk (10/04/2023)  Depression (PHQ2-9): Low Risk  (09/14/2023)  Tobacco Use: Medium Risk (10/04/2023)     Readmission Risk Interventions     No data to display

## 2023-10-07 NOTE — Progress Notes (Signed)
 1045 virtual visit provider, Charity fundraiser, and Tax inspector. Current health data BP 118/66, P 112. Patient requests discharge this am she needs to get to hospital for mother. Provider instructed to follow up with labs with her oncologist at f/u appointment scheduled 9/24. Advised patient to use sugar free lozenges for dry mouth. Medications reviewed and all questions answered to patients satisfaction. AVS provided and reviewed.

## 2023-10-07 NOTE — Progress Notes (Signed)
 0820 virtual visit after identification complete patient self administered levothyroxine . Denies any increase in pain refused neb and Incruse at this time as she and grand daughter just getting up. Provided medic ETA. In preparation for D/C denies any new needs.

## 2023-10-08 LAB — CULTURE, BLOOD (ROUTINE X 2)
Culture: NO GROWTH
Culture: NO GROWTH

## 2023-10-10 NOTE — Discharge Summary (Signed)
 Physician Discharge Summary  Julie Nicholson FMW:[id number] DOB: 1965/07/27 DOA: 10/03/2023 [facility] DC SUMMARY Patient identified as Julie Nicholson 1965/12/11 I was located in [address] [place] Seen via zoom PCP: Cristopher Suzen HERO, NP  Admit date: 10/03/2023 Discharge date: 10/07/2023 Disposition: home Recommendations for Outpatient Follow-up:  Follow up with PCP in 1-2 weeks Please obtain BMP/CBC in one week  Home Health:none Equipment/Devices:none Discharge Condition:stable CODE STATUS:full Diet recommendation: cardiac Brief/Interim Summary: 58 y.o. female with history of recurrent and metastatic non-small cell lung cancer on systemic chemotherapy with carboplatin  Alimta  and Keytruda  every 3 weeks next 1 due next week, chronic HFrEF last EF measured was May 2025 was 40 to 45%, COPD depression anemia presents to the ER for the second time in 24 hours with complaints of worsening right leg pain and swelling.  Patient had come to the ER a day ago when Dopplers were done and was negative.  Since then the pain has been worsening she presents back to the ER.  Patient stated symptoms started on Saturday morning 48 hours ago when she woke up from sleep when she noticed that her right leg was swollen and painful.  She has good sensation of the legs and also has no restriction of the function of the leg except for the pain.  Denies any trauma or insect bites.  Last couple of days patient has been using Lasix  to decrease the fluid in the right leg.    In the ER Dopplers were negative for DVT yesterday. Today CT scan was done which showed knee swelling with some enhancement concerning for possible infection of the joint swelling. There is also fluid collection around the lower leg extending to the thighs. Aspiration of the joint showed WBC of 5700 with neutrophils of 89%. No organisms seen. Cultures obtained started on empiric antibiotic for cellulitis. Patient blood pressure is in the low normal. Per  record patient's blood pressure runs low according to the cardiology notes   Patient is being treated with IV antibiotics for right lower extremity cellulitis.   Further hospital course and management as outlined below.  Discharge Diagnoses:  Principal Problem:   Cellulitis of right leg Active Problems:   Macrocytic anemia   Pseudogout of right knee   Adenocarcinoma of right lung, stage 4 (HCC)   COPD (chronic obstructive pulmonary disease) (HCC)   HFrEF (heart failure with reduced ejection fraction) (HCC)    Cellulitis of right leg Admitted to [facility] program on 10/04/23. Presented with 2 days of worsening pain and redness of right ankle/foot after injury her right knee while trying to put together a dog crate at home.  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 SHE WAS TREATED WITH ROCEPHIN  AND ZYVOX   AND DISCHARGED ON AUGMENTIN  AND ZYVOX .   Pseudogout of right knee s/p right arthrocentesis by EDP.  Synovial fluid analysis negative for septic joint.  Only 5780 WBC in joint fluid. +INTRACELLULAR CALCIUM  PYROPHOSPHATE CRYSTALS consistent with pseudogout.  --Rolling walker recommended by PT for ambulation, delivered to home on admission   Hypomagnesemia - REPLETED   Elevated LFTs - noted on 9/3.  Seems like a transient intermittent issues over past several months based on chart review.  ?due to chemotherapy.  No abdominal symptoms reported. 9/4 - LFT's improving. Pt denies abdominal pain or N/V   Macrocytic anemia 10-04-2023 HgB 7.0 g/dl. Dr. Eldonna discussed with her primary oncologist who wanted her to have 1 unit PRBC's transfused -  Hbg improved to  9.1  Chronic HFrEF   chronic. LVEF 40-45%.  Given 20 mg IV lasix  after PRBC infusion. --Continue home Lasix  20 mg daily PRN for edema/swelling   COPD stable. Not wheezing. On RA. Takes Stiolto  Adenocarcinoma of right lung, stage 4 (HCC)  has stage 4 lung cancer. F/u with oncology. Still undergoing  active chemo. Last chemo on 09-14-2023 with Keytruda  and Alimta .   Estimated body mass index is 18.88 kg/m as calculated from the following:   Height as of this encounter: 5' 2 (1.575 m).   Weight as of this encounter: 46.8 kg.  Discharge Instructions  Discharge Instructions     Diet - low sodium heart healthy   Complete by: As directed    Increase activity slowly   Complete by: As directed       Allergies as of 10/07/2023       Reactions   Carboplatin  Shortness Of Breath, Itching, Cough   See progress note from 06/16/22        Medication List     STOP taking these medications    cyclobenzaprine  5 MG tablet Commonly known as: FLEXERIL    cyclobenzaprine  5 MG tablet Commonly known as: FLEXERIL    methylPREDNISolone  4 MG Tbpk tablet Commonly known as: MEDROL  DOSEPAK   ondansetron  8 MG disintegrating tablet Commonly known as: ZOFRAN -ODT   Oxycodone  HCl 10 MG Tabs   ROLAIDS PO       TAKE these medications    acetaminophen  500 MG tablet Commonly known as: TYLENOL  Take 1,000 mg by mouth every 6 (six) hours as needed for moderate pain.   albuterol  (2.5 MG/3ML) 0.083% nebulizer solution Commonly known as: PROVENTIL  albuterol  sulfate 2.5 mg/3 mL (0.083 %) solution for nebulization  USE 1 VIAL IN NEBULIZER EVERY 6 HOURS AS NEEDED FOR WHEEZING OR SHORTNESS OF BREATH   albuterol  108 (90 Base) MCG/ACT inhaler Commonly known as: VENTOLIN  HFA Inhale 1-2 puffs into the lungs every 6 (six) hours as needed for wheezing or shortness of breath.   amoxicillin -clavulanate 875-125 MG tablet Commonly known as: AUGMENTIN  Take 1 tablet by mouth 2 (two) times daily.   aspirin  EC 81 MG tablet Take 81 mg by mouth at bedtime.   atorvastatin  40 MG tablet Commonly known as: LIPITOR Take 40 mg by mouth at bedtime.   azelastine  0.1 % nasal spray Commonly known as: ASTELIN  Place 1 spray into both nostrils 2 (two) times daily as needed. Use in each nostril as directed    benzonatate  200 MG capsule Commonly known as: TESSALON  Take 1 capsule (200 mg total) by mouth 3 (three) times daily as needed.   Biotin 5000 MCG Tabs Take 5,000 mcg by mouth at bedtime.   buPROPion  150 MG 24 hr tablet Commonly known as: WELLBUTRIN  XL Take 150 mg by mouth at bedtime.   diclofenac  Sodium 1 % Gel Commonly known as: Voltaren  Apply 4 g topically 4 (four) times daily.   famotidine  20 MG tablet Commonly known as: PEPCID  Take 1 tablet by mouth twice daily   fluticasone  50 MCG/ACT nasal spray Commonly known as: FLONASE  Place 2 sprays into both nostrils daily.   folic acid  1 MG tablet Commonly known as: FOLVITE  Take 1 tablet by mouth once daily   furosemide  20 MG tablet Commonly known as: LASIX  Take 20 mg by mouth as needed for fluid or edema.   HAIR SKIN AND NAILS FORMULA PO Take 1 tablet by mouth daily.   hydrocortisone  2.5 % ointment APPLY OINTMENT EXTERNALLY TWICE DAILY TO THE LOWER LEGS  FOR UP TO 2 WEEKS MAXIMUM External; Duration: 30 Days   levothyroxine  25 MCG tablet Commonly known as: Synthroid  Take 1 tablet (25 mcg total) by mouth daily before breakfast.   lidocaine -prilocaine  cream Commonly known as: EMLA  APPLY TO PORT-A-CATH 30 TO 60 MINUTES BEFORE TREATMENT   linezolid  600 MG tablet Commonly known as: ZYVOX  Take 1 tablet (600 mg total) by mouth 2 (two) times daily.   LINZESS PO Take 1 tablet by mouth daily.   loratadine  10 MG tablet Commonly known as: CLARITIN  Take 10 mg by mouth daily.   polyethylene glycol powder 17 GM/SCOOP powder Commonly known as: MiraLax  Take 17 g by mouth 2 (two) times daily as needed for mild constipation.   prochlorperazine  10 MG tablet Commonly known as: COMPAZINE  Take 1 tablet (10 mg total) by mouth every 6 (six) hours as needed.   rizatriptan 10 MG disintegrating tablet Commonly known as: MAXALT-MLT Take 10 mg by mouth as needed for migraine. May repeat in 2 hours if needed   senna-docusate 8.6-50 MG  tablet Commonly known as: Senokot-S Take 2 tablets by mouth 2 (two) times daily between meals as needed for moderate constipation.   sertraline  50 MG tablet Commonly known as: ZOLOFT  Take 1 tablet by mouth daily.   Stiolto Respimat  2.5-2.5 MCG/ACT Aers Generic drug: Tiotropium Bromide-Olodaterol Inhale 2 puffs into the lungs daily.   triamcinolone cream 0.1 % Commonly known as: KENALOG 1 Application.       ASK your doctor about these medications    cyclobenzaprine  5 MG tablet Commonly known as: FLEXERIL  Take 2 tablets (10 mg total) by mouth 2 (two) times daily as needed for up to 2 days for muscle spasms. HOLD for SBP <105 Ask about: Should I take this medication?   Oxycodone  HCl 10 MG Tabs Take 1 tablet (10 mg total) by mouth 3 (three) times daily as needed for up to 2 days (pain). HOLD for SBP <105 Ask about: Should I take this medication?        Follow-up Information     Oncology Follow up on 10/26/2023.   Why: Check mychart or at office for time        Cristopher Suzen HERO, NP Follow up.   Contact information: [address] [place] [address] [phone]                Allergies  Allergen Reactions   Carboplatin  Shortness Of Breath, Itching and Cough    See progress note from 06/16/22    Consultations:none   Procedures/Studies: CT TIBIA FIBULA RIGHT W CONTRAST Result Date: 10/03/2023 CLINICAL DATA:  Redness, swelling, pain. EXAM: CT OF THE LOWER RIGHT EXTREMITY WITH CONTRAST TECHNIQUE: Multidetector CT imaging of the lower right extremity was performed according to the standard protocol following intravenous contrast administration. RADIATION DOSE REDUCTION: This exam was performed according to the departmental dose-optimization program which includes automated exposure control, adjustment of the mA and/or kV according to patient size and/or use of iterative reconstruction technique. CONTRAST:  75mL OMNIPAQUE  IOHEXOL  350 MG/ML SOLN COMPARISON:   None Available. FINDINGS: Bones/Joint/Cartilage Moderate joint effusion with synovial enhancement. There is a Engineer, production cyst tracking posteriorly. Moderate knee chondrocalcinosis. No fracture. No erosions or bony destructive change. No periostitis. The ankle mortise is preserved. Ligaments Suboptimally assessed by CT. Muscles and Tendons Tenosynovial fluid about the posterior tibial tendon at the ankle. Perifascial fluid about the posterior and anterior muscle compartments of the calf, and to a lesser extent the posterior thigh muscle compartment.  No organized or drainable fluid collection. Soft tissues Subcutaneous edema, more confluent near the ankle. No subcutaneous collection. The vascular structures appear patent. No soft tissue gas or radiopaque foreign body. IMPRESSION: 1. Moderate joint effusion with synovial enhancement. Etiology is indeterminate, however infection is considered. Consider fluid sampling. 2. Perifascial fluid about the posterior and anterior muscle compartments of the calf, and to a lesser extent the posterior thigh. No organized or drainable fluid collection. 3. Tenosynovial fluid about the posterior tibial tendon at the ankle. 4. Subcutaneous edema, more confluent near the ankle. No subcutaneous collection. 5. No acute osseous abnormality. 6. Chondrocalcinosis about the knee. Electronically Signed   By: Andrea Gasman M.D.   On: 10/03/2023 19:57   VAS US  LOWER EXTREMITY VENOUS (DVT) (ONLY [facility] & [facility]) Result Date: 10/03/2023  Lower Venous DVT Study Patient Name:  Julie Nicholson  Date of Exam:   10/01/2023 Medical Rec #: [id number]         Accession #:    [id number] Date of Birth: February 01, 1966         Patient Gender: F Patient Age:   58 years Exam Location:  [facility] Procedure:      VAS US  LOWER EXTREMITY VENOUS (DVT) Referring Phys: LONNI CAMP --------------------------------------------------------------------------------  Indications: Muscle spasms, knee and posterior calf pain. Hit  leg on dog crate yesterday, increased pain and swelling today.  Risk Factors: Cancer Metastatic stage IV non-small cell lung cancer. On palliative systemic chemotherapy and Limitations: Involuntary patient movement secondary to spasms and pain. Holding breath during spasms. Comparison Study: No prior study on file Performing Technologist: Alberta Lis RVS  Examination Guidelines: A complete evaluation includes B-mode imaging, spectral Doppler, color Doppler, and power Doppler as needed of all accessible portions of each vessel. Bilateral testing is considered an integral part of a complete examination. Limited examinations for reoccurring indications may be performed as noted. The reflux portion of the exam is performed with the patient in reverse Trendelenburg.  +---------+---------------+---------+-----------+----------+-------------------+ RIGHT    CompressibilityPhasicitySpontaneityPropertiesThrombus Aging      +---------+---------------+---------+-----------+----------+-------------------+ CFV      Full           Yes      No                                       +---------+---------------+---------+-----------+----------+-------------------+ SFJ      Full                                                             +---------+---------------+---------+-----------+----------+-------------------+ FV Prox  Full           Yes      Yes                                      +---------+---------------+---------+-----------+----------+-------------------+ FV Mid   Full                                                             +---------+---------------+---------+-----------+----------+-------------------+  FV DistalFull           Yes      Yes                                      +---------+---------------+---------+-----------+----------+-------------------+ PFV      Full           Yes      Yes                                       +---------+---------------+---------+-----------+----------+-------------------+ POP                     Yes      Yes                  patent by color and                                                       Doppler             +---------+---------------+---------+-----------+----------+-------------------+ PTV      Full                                                             +---------+---------------+---------+-----------+----------+-------------------+ PERO     Full                                                             +---------+---------------+---------+-----------+----------+-------------------+   +----+---------------+---------+-----------+----------+--------------+ LEFTCompressibilityPhasicitySpontaneityPropertiesThrombus Aging +----+---------------+---------+-----------+----------+--------------+ CFV Full           Yes      Yes                                 +----+---------------+---------+-----------+----------+--------------+ SFJ Full                                                        +----+---------------+---------+-----------+----------+--------------+     Summary: RIGHT: - There is no evidence of deep vein thrombosis in the lower extremity.  - No cystic structure found in the popliteal fossa.  LEFT: - No evidence of common femoral vein obstruction.   *See table(s) above for measurements and observations. Electronically signed by Debby Robertson on 10/03/2023 at 11:27:25 AM.    Final    (Echo, Carotid, EGD, Colonoscopy, ERCP)    Subjective:   Discharge Exam: Vitals:   10/06/23 2300 10/07/23 1042  BP:  118/66  Pulse:  100  Resp:    Temp:    SpO2: 98% 98%   Vitals:   10/06/23 1100  10/06/23 1700 10/06/23 2300 10/07/23 1042  BP: 114/66 118/68  118/66  Pulse: 68 (!) 110  100  Resp: 18 18    Temp:      TempSrc:      SpO2: 97% 97% 98% 98%  Weight:      Height:       Examined by paramedic hassell General: Pt is alert,  awake, not in acute distress Cardiovascular: RRR, S1/S2 +, no rubs, no gallops Respiratory: CTA bilaterally, no wheezing, no rhonchi Abdominal: Soft, NT, ND, bowel sounds + Extremities:decreased erythema edema    The results of significant diagnostics from this hospitalization (including imaging, microbiology, ancillary and laboratory) are listed below for reference.     Microbiology: Recent Results (from the past 240 hours)  Culture, blood (Routine x 2)     Status: None   Collection Time: 10/03/23  2:26 PM   Specimen: BLOOD  Result Value Ref Range Status   Specimen Description BLOOD LEFT ANTECUBITAL  Final   Special Requests   Final    BOTTLES DRAWN AEROBIC AND ANAEROBIC Blood Culture results may not be optimal due to an inadequate volume of blood received in culture bottles   Culture   Final    NO GROWTH 5 DAYS Performed at [facility], [address] [place], [place] [address]    Report Status 10/08/2023 FINAL  Final  Culture, blood (Routine x 2)     Status: None   Collection Time: 10/03/23  2:56 PM   Specimen: BLOOD RIGHT FOREARM  Result Value Ref Range Status   Specimen Description BLOOD RIGHT FOREARM  Final   Special Requests   Final    BOTTLES DRAWN AEROBIC ONLY Blood Culture results may not be optimal due to an inadequate volume of blood received in culture bottles   Culture   Final    NO GROWTH 5 DAYS Performed at [facility], [address], [place], [place] [address]    Report Status 10/08/2023 FINAL  Final  Body fluid culture w Gram Stain     Status: None   Collection Time: 10/03/23  9:11 PM   Specimen: Synovium; Body Fluid  Result Value Ref Range Status   Specimen Description SYNOVIAL RIGHT  Final   Special Requests NONE  Final   Gram Stain   Final    ABUNDANT WBC PRESENT, PREDOMINANTLY PMN NO ORGANISMS SEEN    Culture   Final    NO GROWTH 3 DAYS Performed at [facility], [address] [place], [place] [address]    Report Status  10/07/2023 FINAL  Final  MRSA Next Gen by PCR, Nasal     Status: None   Collection Time: 10/04/23  7:41 AM   Specimen: Nasal Mucosa; Nasal Swab  Result Value Ref Range Status   MRSA by PCR Next Gen NOT DETECTED NOT DETECTED Final    Comment: (NOTE) The GeneXpert MRSA Assay (FDA approved for NASAL specimens only), is one component of a comprehensive MRSA colonization surveillance program. It is not intended to diagnose MRSA infection nor to guide or monitor treatment for MRSA infections. Test performance is not FDA approved in patients less than 9 years old. Performed at [facility], [address], [place], [place] [address]      Labs: BNP (last 3 results) No results for input(s): BNP in the last 8760 hours. Basic Metabolic Panel: Recent Labs  Lab 10/04/23 0453 10/05/23 0500 10/06/23 1229  NA 137 134* 141  K 3.3* 3.7 3.6  CL 104 101 103  CO2 24 22 25   GLUCOSE 122* 122* 130*  BUN 8 9 7   CREATININE 0.86 0.96 1.01*  CALCIUM  8.6* 8.8* 9.1  MG 1.5* 1.6* 1.8   Liver Function Tests: Recent Labs  Lab 10/04/23 0453 10/05/23 0500 10/06/23 1229  AST 38 163* 75*  ALT 27 82* 61*  ALKPHOS 103 182* 188*  BILITOT 0.7 0.7 0.5  PROT 5.6* 6.2* 6.4*  ALBUMIN  2.2* 2.3* 2.2*   No results for input(s): LIPASE, AMYLASE in the last 168 hours. No results for input(s): AMMONIA in the last 168 hours. CBC: Recent Labs  Lab 10/04/23 0453 10/04/23 1659 10/04/23 1934 10/05/23 0500 10/06/23 1229  WBC 3.0* 2.7* 3.2* 2.9* 2.9*  NEUTROABS 2.0  --   --  2.2  --   HGB 7.0* 6.2* 8.7* 8.7* 9.1*  HCT 21.5* 19.3* 26.4* 26.7* 27.3*  MCV 105.9* 104.9* 101.1* 101.1* 100.7*  PLT 149* 131* 203 206 217   Cardiac Enzymes: No results for input(s): CKTOTAL, CKMB, CKMBINDEX, TROPONINI in the last 168 hours. BNP: Invalid input(s): POCBNP CBG: No results for input(s): GLUCAP in the last 168 hours. D-Dimer No results for input(s): DDIMER in the last 72 hours. Hgb  A1c No results for input(s): HGBA1C in the last 72 hours. Lipid Profile No results for input(s): CHOL, HDL, LDLCALC, TRIG, CHOLHDL, LDLDIRECT in the last 72 hours. Thyroid  function studies No results for input(s): TSH, T4TOTAL, T3FREE, THYROIDAB in the last 72 hours.  Invalid input(s): FREET3 Anemia work up No results for input(s): VITAMINB12, FOLATE, FERRITIN, TIBC, IRON, RETICCTPCT in the last 72 hours. Urinalysis    Component Value Date/Time   COLORURINE YELLOW 06/23/2023 1111   APPEARANCEUR CLEAR 06/23/2023 1111   LABSPEC 1.015 06/23/2023 1111   PHURINE 7.0 06/23/2023 1111   GLUCOSEU NEGATIVE 06/23/2023 1111   GLUCOSEU NEGATIVE 03/01/2013 0733   HGBUR NEGATIVE 06/23/2023 1111   BILIRUBINUR NEGATIVE 06/23/2023 1111   KETONESUR NEGATIVE 06/23/2023 1111   PROTEINUR NEGATIVE 06/23/2023 1111   UROBILINOGEN 0.2 03/01/2013 0733   NITRITE NEGATIVE 06/23/2023 1111   LEUKOCYTESUR NEGATIVE 06/23/2023 1111   Sepsis Labs Recent Labs  Lab 10/04/23 1659 10/04/23 1934 10/05/23 0500 10/06/23 1229  WBC 2.7* 3.2* 2.9* 2.9*   Microbiology Recent Results (from the past 240 hours)  Culture, blood (Routine x 2)     Status: None   Collection Time: 10/03/23  2:26 PM   Specimen: BLOOD  Result Value Ref Range Status   Specimen Description BLOOD LEFT ANTECUBITAL  Final   Special Requests   Final    BOTTLES DRAWN AEROBIC AND ANAEROBIC Blood Culture results may not be optimal due to an inadequate volume of blood received in culture bottles   Culture   Final    NO GROWTH 5 DAYS Performed at [facility], [address] [place], [place] [address]    Report Status 10/08/2023 FINAL  Final  Culture, blood (Routine x 2)     Status: None   Collection Time: 10/03/23  2:56 PM   Specimen: BLOOD RIGHT FOREARM  Result Value Ref Range Status   Specimen Description BLOOD RIGHT FOREARM  Final   Special Requests   Final    BOTTLES DRAWN AEROBIC ONLY Blood  Culture results may not be optimal due to an inadequate volume of blood received in culture bottles   Culture   Final    NO GROWTH 5 DAYS Performed at [facility], [address] [place], [place] [address]    Report Status 10/08/2023 FINAL  Final  Body fluid culture w Gram Stain     Status: None   Collection Time: 10/03/23  9:11 PM   Specimen: Synovium; Body Fluid  Result Value Ref Range Status   Specimen Description SYNOVIAL RIGHT  Final   Special Requests NONE  Final   Gram Stain   Final    ABUNDANT WBC PRESENT, PREDOMINANTLY PMN NO ORGANISMS SEEN    Culture   Final    NO GROWTH 3 DAYS Performed at [facility], [address], [place], [place] [address]    Report Status 10/07/2023 FINAL  Final  MRSA Next Gen by PCR, Nasal     Status: None   Collection Time: 10/04/23  7:41 AM   Specimen: Nasal Mucosa; Nasal Swab  Result Value Ref Range Status   MRSA by PCR Next Gen NOT DETECTED NOT DETECTED Final    Comment: (NOTE) The GeneXpert MRSA Assay (FDA approved for NASAL specimens only), is one component of a comprehensive MRSA colonization surveillance program. It is not intended to diagnose MRSA infection nor to guide or monitor treatment for MRSA infections. Test performance is not FDA approved in patients less than 52 years old. Performed at [facility], [address] [place], [place] [address]      Time coordinating discharge: 39 min  SIGNED:   Almarie KANDICE Hoots, MD  [facility] 10/10/2023, 3:53 PM

## 2023-10-11 NOTE — Telephone Encounter (Cosign Needed)
 Flexeril  prescription provided

## 2023-10-13 ENCOUNTER — Ambulatory Visit: Admitting: Cardiovascular Disease

## 2023-10-14 ENCOUNTER — Encounter: Payer: Self-pay | Admitting: Internal Medicine

## 2023-10-14 ENCOUNTER — Encounter: Payer: Self-pay | Admitting: Physician Assistant

## 2023-10-16 ENCOUNTER — Other Ambulatory Visit: Payer: Self-pay

## 2023-10-20 ENCOUNTER — Ambulatory Visit: Admitting: Cardiology

## 2023-10-24 ENCOUNTER — Other Ambulatory Visit: Payer: Self-pay | Admitting: Internal Medicine

## 2023-10-24 DIAGNOSIS — C3411 Malignant neoplasm of upper lobe, right bronchus or lung: Secondary | ICD-10-CM

## 2023-10-25 ENCOUNTER — Encounter: Payer: Self-pay | Admitting: Physician Assistant

## 2023-10-25 ENCOUNTER — Encounter: Payer: Self-pay | Admitting: Internal Medicine

## 2023-10-26 ENCOUNTER — Inpatient Hospital Stay (HOSPITAL_BASED_OUTPATIENT_CLINIC_OR_DEPARTMENT_OTHER): Admitting: Internal Medicine

## 2023-10-26 ENCOUNTER — Encounter: Payer: Self-pay | Admitting: Internal Medicine

## 2023-10-26 ENCOUNTER — Inpatient Hospital Stay

## 2023-10-26 ENCOUNTER — Encounter: Payer: Self-pay | Admitting: General Practice

## 2023-10-26 ENCOUNTER — Encounter: Payer: Self-pay | Admitting: Physician Assistant

## 2023-10-26 ENCOUNTER — Inpatient Hospital Stay: Attending: Internal Medicine

## 2023-10-26 VITALS — HR 97

## 2023-10-26 VITALS — BP 120/78 | HR 105 | Temp 97.7°F | Resp 17 | Ht 62.0 in | Wt 95.3 lb

## 2023-10-26 DIAGNOSIS — C3490 Malignant neoplasm of unspecified part of unspecified bronchus or lung: Secondary | ICD-10-CM | POA: Diagnosis not present

## 2023-10-26 DIAGNOSIS — M25461 Effusion, right knee: Secondary | ICD-10-CM | POA: Insufficient documentation

## 2023-10-26 DIAGNOSIS — C778 Secondary and unspecified malignant neoplasm of lymph nodes of multiple regions: Secondary | ICD-10-CM | POA: Diagnosis not present

## 2023-10-26 DIAGNOSIS — C3491 Malignant neoplasm of unspecified part of right bronchus or lung: Secondary | ICD-10-CM

## 2023-10-26 DIAGNOSIS — Z5112 Encounter for antineoplastic immunotherapy: Secondary | ICD-10-CM | POA: Diagnosis present

## 2023-10-26 DIAGNOSIS — L03115 Cellulitis of right lower limb: Secondary | ICD-10-CM | POA: Insufficient documentation

## 2023-10-26 DIAGNOSIS — C3411 Malignant neoplasm of upper lobe, right bronchus or lung: Secondary | ICD-10-CM | POA: Diagnosis present

## 2023-10-26 DIAGNOSIS — R634 Abnormal weight loss: Secondary | ICD-10-CM | POA: Diagnosis not present

## 2023-10-26 DIAGNOSIS — K2100 Gastro-esophageal reflux disease with esophagitis, without bleeding: Secondary | ICD-10-CM | POA: Insufficient documentation

## 2023-10-26 DIAGNOSIS — Z5111 Encounter for antineoplastic chemotherapy: Secondary | ICD-10-CM | POA: Diagnosis present

## 2023-10-26 DIAGNOSIS — R1310 Dysphagia, unspecified: Secondary | ICD-10-CM | POA: Diagnosis not present

## 2023-10-26 LAB — CBC WITH DIFFERENTIAL (CANCER CENTER ONLY)
Abs Immature Granulocytes: 0.01 K/uL (ref 0.00–0.07)
Basophils Absolute: 0 K/uL (ref 0.0–0.1)
Basophils Relative: 1 %
Eosinophils Absolute: 0.1 K/uL (ref 0.0–0.5)
Eosinophils Relative: 4 %
HCT: 34.6 % — ABNORMAL LOW (ref 36.0–46.0)
Hemoglobin: 11.6 g/dL — ABNORMAL LOW (ref 12.0–15.0)
Immature Granulocytes: 0 %
Lymphocytes Relative: 24 %
Lymphs Abs: 0.9 K/uL (ref 0.7–4.0)
MCH: 33.2 pg (ref 26.0–34.0)
MCHC: 33.5 g/dL (ref 30.0–36.0)
MCV: 99.1 fL (ref 80.0–100.0)
Monocytes Absolute: 0.4 K/uL (ref 0.1–1.0)
Monocytes Relative: 12 %
Neutro Abs: 2.2 K/uL (ref 1.7–7.7)
Neutrophils Relative %: 59 %
Platelet Count: 203 K/uL (ref 150–400)
RBC: 3.49 MIL/uL — ABNORMAL LOW (ref 3.87–5.11)
RDW: 15.4 % (ref 11.5–15.5)
WBC Count: 3.6 K/uL — ABNORMAL LOW (ref 4.0–10.5)
nRBC: 0 % (ref 0.0–0.2)

## 2023-10-26 LAB — SAMPLE TO BLOOD BANK

## 2023-10-26 LAB — CMP (CANCER CENTER ONLY)
ALT: 14 U/L (ref 0–44)
AST: 23 U/L (ref 15–41)
Albumin: 3.6 g/dL (ref 3.5–5.0)
Alkaline Phosphatase: 127 U/L — ABNORMAL HIGH (ref 38–126)
Anion gap: 7 (ref 5–15)
BUN: 13 mg/dL (ref 6–20)
CO2: 29 mmol/L (ref 22–32)
Calcium: 9.5 mg/dL (ref 8.9–10.3)
Chloride: 105 mmol/L (ref 98–111)
Creatinine: 0.96 mg/dL (ref 0.44–1.00)
GFR, Estimated: >60 mL/min (ref >60)
Glucose, Bld: 144 mg/dL — ABNORMAL HIGH (ref 70–99)
Potassium: 3.3 mmol/L — ABNORMAL LOW (ref 3.5–5.1)
Sodium: 141 mmol/L (ref 135–145)
Total Bilirubin: 0.5 mg/dL (ref 0.0–1.2)
Total Protein: 7.1 g/dL (ref 6.5–8.1)

## 2023-10-26 MED ORDER — ONDANSETRON HCL 4 MG/2ML IJ SOLN
8.0000 mg | Freq: Once | INTRAMUSCULAR | Status: AC
  Administered 2023-10-26: 8 mg via INTRAVENOUS
  Filled 2023-10-26: qty 4

## 2023-10-26 MED ORDER — SODIUM CHLORIDE 0.9 % IV SOLN
200.0000 mg | Freq: Once | INTRAVENOUS | Status: AC
  Administered 2023-10-26: 200 mg via INTRAVENOUS
  Filled 2023-10-26: qty 200

## 2023-10-26 MED ORDER — SODIUM CHLORIDE 0.9 % IV SOLN
400.0000 mg/m2 | Freq: Once | INTRAVENOUS | Status: AC
  Administered 2023-10-26: 600 mg via INTRAVENOUS
  Filled 2023-10-26: qty 20

## 2023-10-26 MED ORDER — SODIUM CHLORIDE 0.9 % IV SOLN: Freq: Once | INTRAVENOUS | Status: AC

## 2023-10-26 NOTE — Progress Notes (Signed)
 [facility] Spiritual Care Note  Followed up with Julie Nicholson in infusion. She was in good spirits and reports doing well overall, despite caregiver distress at home. Julie Nicholson has direct Spiritual Care number in case needs arise before follow-up at another treatment.  [address] [place], [place], [facility] Pager [phone] Voicemail [phone]

## 2023-10-26 NOTE — Patient Instructions (Signed)
 [facility] - A DEPT OF [facility]  Discharge Instructions: Thank you for choosing [facility] to provide your oncology and hematology care.   If you have a lab appointment with the Cancer Center, please go directly to the Cancer Center and check in at the registration area.   Wear comfortable clothing and clothing appropriate for easy access to any Portacath or PICC line.   We strive to give you quality time with your provider. You may need to reschedule your appointment if you arrive late (15 or more minutes).  Arriving late affects you and other patients whose appointments are after yours.  Also, if you miss three or more appointments without notifying the office, you may be dismissed from the clinic at the provider's discretion.      For prescription refill requests, have your pharmacy contact our office and allow 72 hours for refills to be completed.    Today you received the following chemotherapy and/or immunotherapy agents: Keytruda, Alimta.       To help prevent nausea and vomiting after your treatment, we encourage you to take your nausea medication as directed.  BELOW ARE SYMPTOMS THAT SHOULD BE REPORTED IMMEDIATELY: *FEVER GREATER THAN 100.4 F (38 C) OR HIGHER *CHILLS OR SWEATING *NAUSEA AND VOMITING THAT IS NOT CONTROLLED WITH YOUR NAUSEA MEDICATION *UNUSUAL SHORTNESS OF BREATH *UNUSUAL BRUISING OR BLEEDING *URINARY PROBLEMS (pain or burning when urinating, or frequent urination) *BOWEL PROBLEMS (unusual diarrhea, constipation, pain near the anus) TENDERNESS IN MOUTH AND THROAT WITH OR WITHOUT PRESENCE OF ULCERS (sore throat, sores in mouth, or a toothache) UNUSUAL RASH, SWELLING OR PAIN  UNUSUAL VAGINAL DISCHARGE OR ITCHING   Items with * indicate a potential emergency and should be followed up as soon as possible or go to the Emergency Department if any problems should occur.  Please show the CHEMOTHERAPY ALERT CARD or  IMMUNOTHERAPY ALERT CARD at check-in to the Emergency Department and triage nurse.  Should you have questions after your visit or need to cancel or reschedule your appointment, please contact [facility] - A DEPT OF [facility]  Dept: [phone]  and follow the prompts.  Office hours are 8:00 a.m. to 4:30 p.m. Monday - Friday. Please note that voicemails left after 4:00 p.m. may not be returned until the following business day.  We are closed weekends and major holidays. You have access to a nurse at all times for urgent questions. Please call the main number to the clinic Dept: [phone] and follow the prompts.   For any non-urgent questions, you may also contact your provider using MyChart. We now offer e-Visits for anyone 61 and older to request care online for non-urgent symptoms. For details visit [email or web address].   Also download the MyChart app! Go to the app store, search "MyChart", open the app, select , and log in with your MyChart username and password.

## 2023-10-26 NOTE — Progress Notes (Signed)
 [facility] [phone]   [phone]  OFFICE PROGRESS NOTE  Shraddha, Lebron, NP [address] [place] [address]  DIAGNOSIS: Recurrent/metastatic non-small cell lung cancer initially diagnosed as stage IIIB  (T1b, N3, M0) non-small cell lung cancer, adenocarcinoma she presented with right upper lobe nodule in addition to bulky right hilar, mediastinal, and left supraclavicular lymphadenopathy diagnosed in November 2022.  The patient had evidence for disease recurrence in the mediastinal and right supraclavicular lymphadenopathy in August 2023.  The patient had evidence of metastatic disease in February 2024 with several supraclavicular, thoracic and precarinal lymphadenopathy as well as upper abdominal lymph nodes and small liver lesion.  DETECTED ALTERATION(S) / BIOMARKER(S) % CFDNA OR AMPLIFICATION ASSOCIATED FDA-APPROVED THERAPIES CLINICAL TRIAL AVAILABILITY [id number] ND 0.5 5 50 100 4.7%  RHOAG17E ND 0.5 5 50 100 1.8%  CTNNB1S37C ND 0.5 5 50 100 1.9%  BIOMARKER ADDITIONAL DETAILS Tumor Mutational Burden (TMB) 19.02 mut/Mb MSI Status Stable (MSS) PD-L1 Tumor Proportion Score (TPS)* <1%  Biomarker Findings By [facility] on 05/06/2022 Tumor Mutational Burden - 17 Muts/Mb Microsatellite status - MS-Stable Genomic Findings For a complete list of the genes assayed, please refer to the Appendix. CTNNB1 S37C CRKL amplification MAP2K4 loss exons 2-11 TP53 V143A 8 Disease relevant genes with no reportable alterations: ALK, BRAF, EGFR, ERBB2, KRAS, MET, RET, ROS1  PDL1 0%   PRIOR THERAPY:  1) Concurrent chemoradiation with carboplatin  for an AUC of 2 and paclitaxel  45 mg per metered squared.  First dose on 01/05/2021.  Status post 7 cycles of treatment.  Last dose was given February 16, 2021. 2) Consolidation immunotherapy with Imfinzi  1500 Mg IV every 4 weeks.  First dose March 25, 2021.  Status post 3 cycles.  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 3) Palliative radiotherapy to the enlarging right supraclavicular lymphadenopathy under the care of Dr. Shannon expected to be completed on October 28, 2021.   CURRENT THERAPY: Palliative systemic chemotherapy with carboplatin  for AUC of 5, Alimta  500 Mg/M2 and Keytruda  200 Mg IV every 3 weeks.  First dose April 05, 2022.  Status post 24 cycles.  Starting from cycle #3 her dose of carboplatin  was reduced to AUC of 4 and Alimta  400 Mg/M2.  Starting from cycle #5 the patient will be on maintenance treatment with Alimta  and Keytruda  every 3 weeks.  INTERVAL HISTORY: [name] 58 y.o. female returns to the clinic today for follow-up visit.  Discussed the use of AI scribe software for clinical note transcription with the patient, who gave verbal consent to proceed.  History of Present Illness [name] is a 58 year old female with metastatic non-small cell lung cancer who presents for evaluation before starting cycle number twenty-five of her treatment.  She has been receiving palliative systemic chemoimmunotherapy with carboplatin , Alimta , and Keytruda  every three weeks since March 2024. From cycle number five, she transitioned to maintenance treatment with Alimta  and Keytruda  every three weeks, with a planned total of twenty-four cycles.  Recently, she developed swelling, redness, and itchiness in her legs, particularly the right one, which progressed to knee swelling and difficulty walking. She initially sought care from her regular doctor, who referred her to the emergency room for further evaluation. A Doppler scan was performed to rule out a blood clot, which was negative. Despite this, her symptoms worsened, leading to an overnight hospital stay where she was treated for cellulitis with Rocephin  and Zyvox .  She was discharged with Augmentin  and Zyvox  for home treatment, which included in-home care for four to  five days. Her ankle and knee remain sore, though the swelling has subsided.  No current chest pain, shortness of breath, nausea, or vomiting. She notes a slight weight loss of one and a half pounds since her last visit.  She has never smoked cigarettes.     MEDICAL HISTORY: Past Medical History:  Diagnosis Date   Abdominal discomfort    Acute sinusitis 11/11/2021   Adenocarcinoma of right lung, stage 4 (HCC) 12/18/2020   Allergic rhinitis 05/08/2021   Antineoplastic chemotherapy induced pancytopenia 02/23/2021   Aortic atherosclerosis 06/24/2023   Cancer (HCC)    Chronic cholecystitis 03/13/2013   Chronic headaches    due to allergies, sinus   Chronic venous hypertension (idiopathic) with other complications of bilateral lower extremity    Constipation 06/23/2023   COPD (chronic obstructive pulmonary disease) (HCC)    per 2012 chest xray   pt states she doesn not have this now (04/10/2013)   COVID-19 virus infection 02/24/2021   Deviated nasal septum    Dyspnea 04/07/2021   Encounter for antineoplastic chemotherapy 12/18/2020   Encounter for antineoplastic immunotherapy 11/17/2022   Eustachian tube dysfunction    GERD (gastroesophageal reflux disease)    occasional uses Tums / Rolaids   Goals of care, counseling/discussion 03/18/2021   Hearing loss    right ear   HFrEF (heart failure with reduced ejection fraction) (HCC) 07/13/2023   High cholesterol    History of radiation therapy    right lung 01/07/2021-02/19/2021  Dr Lynwood Nasuti   History of radiation therapy    Lymph nodes of head,face,neck- 12/23/22-01/07/23- Dr. 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  MEDICATIONS:  Current Outpatient Medications  Medication Sig Dispense Refill   acetaminophen  (TYLENOL ) 500 MG tablet Take 1,000 mg by mouth every 6 (six) hours as needed for moderate pain.     albuterol  (PROVENTIL ) (2.5 MG/3ML) 0.083% nebulizer solution albuterol  sulfate 2.5 mg/3 mL (0.083 %) solution for nebulization  USE 1 VIAL IN NEBULIZER EVERY 6 HOURS AS NEEDED FOR WHEEZING OR SHORTNESS OF BREATH     albuterol  (VENTOLIN  HFA) 108 (90 Base) MCG/ACT inhaler Inhale 1-2 puffs into the lungs every 6 (six) hours as needed for wheezing or shortness of breath. 18 g 3   amoxicillin -clavulanate (AUGMENTIN ) 875-125 MG tablet Take 1 tablet by mouth 2 (two) times daily. 14 tablet 0   aspirin  EC 81 MG tablet Take 81 mg by mouth at bedtime.     atorvastatin  (LIPITOR) 40 MG tablet Take 40 mg by mouth at bedtime.     azelastine  (ASTELIN ) 0.1 % nasal spray Place 1 spray into both nostrils 2 (two) times daily as  needed. Use in each nostril as directed 30 mL 5   benzonatate  (TESSALON ) 200 MG capsule Take 1 capsule (200 mg total) by mouth 3 (three) times daily as needed. 30 capsule 0   Biotin 5000 MCG TABS Take 5,000 mcg by mouth at bedtime.     buPROPion  (WELLBUTRIN  XL) 150 MG 24 hr tablet Take 150 mg by mouth at bedtime.     diclofenac  Sodium (VOLTAREN ) 1 % GEL Apply 4 g topically 4 (four) times daily. 150 g 0   famotidine  (PEPCID ) 20 MG tablet Take 1 tablet by mouth twice daily 60 tablet 0   fluticasone  (FLONASE ) 50 MCG/ACT nasal spray Place 2 sprays into both nostrils daily. 16 g 5   folic acid  (FOLVITE ) 1 MG  tablet Take 1 tablet by mouth once daily 30 tablet 0   furosemide  (LASIX ) 20 MG tablet Take 20 mg by mouth as needed for fluid or edema.     hydrocortisone  2.5 % ointment APPLY OINTMENT EXTERNALLY TWICE DAILY TO THE LOWER LEGS FOR UP TO 2 WEEKS MAXIMUM External; Duration: 30 Days     levothyroxine  (SYNTHROID ) 25 MCG tablet Take 1 tablet (25 mcg total) by mouth daily before breakfast. 30 tablet 2   lidocaine -prilocaine  (EMLA ) cream APPLY TO PORT-A-CATH 30 TO 60 MINUTES BEFORE TREATMENT 30 g 0   linaCLOtide (LINZESS PO) Take 1 tablet by mouth daily.     linezolid  (ZYVOX ) 600 MG tablet Take 1 tablet (600 mg total) by mouth 2 (two) times daily. 14 tablet 0   loratadine  (CLARITIN ) 10 MG tablet Take 10 mg by mouth daily.     Multiple Vitamins-Minerals (HAIR SKIN AND NAILS FORMULA PO) Take 1 tablet by mouth daily.     polyethylene glycol powder (MIRALAX ) 17 GM/SCOOP powder Take 17 g by mouth 2 (two) times daily as needed for mild constipation. 255 g 1   prochlorperazine  (COMPAZINE ) 10 MG tablet Take 1 tablet (10 mg total) by mouth every 6 (six) hours as needed. 30 tablet 2   rizatriptan (MAXALT-MLT) 10 MG disintegrating tablet Take 10 mg by mouth as needed for migraine. May repeat in 2 hours if needed     senna-docusate (SENOKOT-S) 8.6-50 MG tablet Take 2 tablets by mouth 2 (two) times daily between meals as needed for moderate constipation.     sertraline  (ZOLOFT ) 50 MG tablet Take 1 tablet by mouth daily.     Tiotropium Bromide-Olodaterol (STIOLTO RESPIMAT ) 2.5-2.5 MCG/ACT AERS Inhale 2 puffs into the lungs daily. 12 g 3   triamcinolone cream (KENALOG) 0.1 % 1 Application.     No current facility-administered medications for this visit.    SURGICAL HISTORY:  Past Surgical History:  Procedure Laterality Date   ABDOMINAL HYSTERECTOMY  1995   tx endometriosis, both ovaries removed   ADENOIDECTOMY     APPENDECTOMY  late 1990's   BIOPSY  02/26/2021   Procedure: BIOPSY;  Surgeon: Teressa Toribio SQUIBB,  MD;  Location: [facility];  Service: Endoscopy;;   BRONCHIAL BRUSHINGS  12/15/2020   Procedure: BRONCHIAL BRUSHINGS;  Surgeon: Shelah Lamar RAMAN, MD;  Location: [facility];  Service: Cardiopulmonary;;   BRONCHIAL NEEDLE ASPIRATION BIOPSY  12/15/2020   Procedure: BRONCHIAL NEEDLE ASPIRATION BIOPSIES;  Surgeon: Shelah Lamar RAMAN, MD;  Location: [facility];  Service: Cardiopulmonary;;   CHOLECYSTECTOMY  04/10/2013   CHOLECYSTECTOMY N/A 04/10/2013   Procedure: LAPAROSCOPIC CHOLECYSTECTOMY WITH INTRAOPERATIVE CHOLANGIOGRAM;  Surgeon: Krystal JINNY Russell, MD;  Location: [facility] OR;  Service: General;  Laterality: N/A;   ELECTROMAGNETIC NAVIGATION BROCHOSCOPY  12/15/2020   Procedure: ELECTROMAGNETIC NAVIGATION BRONCHOSCOPY;  Surgeon: Shelah Lamar RAMAN, MD;  Location: [facility] ENDOSCOPY;  Service: Cardiopulmonary;;   ESOPHAGOGASTRODUODENOSCOPY (EGD) WITH PROPOFOL  N/A 02/26/2021   Procedure: ESOPHAGOGASTRODUODENOSCOPY (EGD) WITH PROPOFOL ;  Surgeon: Teressa Toribio SQUIBB, MD;  Location: [facility];  Service: Endoscopy;  Laterality: N/A;   EUS N/A 02/16/2013   Procedure: UPPER ENDOSCOPIC ULTRASOUND (EUS) LINEAR;  Surgeon: Toribio SQUIBB Teressa, MD;  Location: [facility];  Service: Endoscopy;  Laterality: N/A;  radial linear   IR IMAGING GUIDED PORT INSERTION  06/30/2022   KNEE ARTHROSCOPY Right 1980's   cartilage OR   LAPAROSCOPIC ENDOMETRIOSIS FULGURATION  1980's   MYRINGOTOMY WITH TUBE PLACEMENT Right 07/13/2018   Procedure: MYRINGOTOMY WITH TUBE PLACEMENT;  Surgeon: Edda Mt, MD;  Location: [facility];  Service: ENT;  Laterality: Right;   MYRINGOTOMY WITH TUBE PLACEMENT Right 06/04/2021   Procedure: MYRINGOTOMY WITH BUTTERFLY TUBE PLACEMENT;  Surgeon: Edda Mt, MD;  Location: [facility];  Service: ENT;  Laterality: Right;   NASOPHARYNGOSCOPY EUSTATION TUBE BALLOON DILATION Right 07/13/2018   Procedure: NASOPHARYNGOSCOPY EUSTATION TUBE BALLOON DILATION;  Surgeon: Edda Mt, MD;  Location:  [facility];  Service: ENT;  Laterality: Right;   TONSILLECTOMY AND ADENOIDECTOMY  ~ 1980   adenoidectomy   TUBAL LIGATION  ~ 1987   TURBINATE REDUCTION Right 07/13/2018   Procedure: OUTFRACTURE TURBINATE;  Surgeon: Edda Mt, MD;  Location: [facility];  Service: ENT;  Laterality: Right;   TYMPANOSTOMY TUBE PLACEMENT     VIDEO BRONCHOSCOPY WITH ENDOBRONCHIAL ULTRASOUND N/A 12/15/2020   Procedure: ROBOTIC VIDEO BRONCHOSCOPY WITH ENDOBRONCHIAL ULTRASOUND;  Surgeon: Shelah Lamar RAMAN, MD;  Location: [facility];  Service: Cardiopulmonary;  Laterality: N/A;   WRIST SURGERY Left    w/plate    REVIEW OF SYSTEMS:  Constitutional: positive for fatigue and weight loss Eyes: negative Ears, nose, mouth, throat, and face: negative Respiratory: negative Cardiovascular: negative Gastrointestinal: negative Genitourinary:negative Integument/breast: negative Hematologic/lymphatic: negative Musculoskeletal:negative Neurological: negative Behavioral/Psych: negative Endocrine: negative Allergic/Immunologic: negative   PHYSICAL EXAMINATION: General appearance: alert, cooperative, fatigued, and no distress Head: Normocephalic, without obvious abnormality, atraumatic Neck: moderate anterior cervical adenopathy, no JVD, supple, symmetrical, trachea midline, and thyroid  not enlarged, symmetric, no tenderness/mass/nodules Lymph nodes: Cervical, supraclavicular, and axillary nodes normal. Resp: clear to auscultation bilaterally Back: symmetric, no curvature. ROM normal. No CVA tenderness. Cardio: regular rate and rhythm, S1, S2 normal, no murmur, click, rub or gallop GI: soft, non-tender; bowel sounds normal; no masses,  no organomegaly Extremities: extremities normal, atraumatic, no cyanosis or edema Neurologic: Alert and oriented X 3, normal strength and tone. Normal symmetric reflexes. Normal coordination and gait  ECOG PERFORMANCE STATUS: 1 - Symptomatic but completely  ambulatory  Blood pressure 120/78, pulse (!) 105, temperature 97.7 F (36.5 C), resp. rate 17, height 5' 2 (1.575 m), weight 95 lb 4.8 oz (43.2 kg), SpO2 95%.  LABORATORY DATA: Lab Results  Component Value Date   WBC 3.6 (L) 10/26/2023   HGB 11.6 (L) 10/26/2023   HCT 34.6 (L) 10/26/2023   MCV 99.1 10/26/2023   PLT 203 10/26/2023      Chemistry      Component Value Date/Time   NA 141 10/26/2023 1025   K 3.3 (L) 10/26/2023 1025   CL 105 10/26/2023 1025   CO2 29 10/26/2023 1025   BUN 13 10/26/2023 1025   CREATININE 0.96 10/26/2023 1025      Component Value Date/Time   CALCIUM  9.5 10/26/2023 1025   ALKPHOS 127 (H) 10/26/2023  1025   AST 23 10/26/2023 1025   ALT 14 10/26/2023 1025   BILITOT 0.5 10/26/2023 1025       RADIOGRAPHIC STUDIES: CT TIBIA FIBULA RIGHT W CONTRAST Result Date: 10/03/2023 CLINICAL DATA:  Redness, swelling, pain. EXAM: CT OF THE LOWER RIGHT EXTREMITY WITH CONTRAST TECHNIQUE: Multidetector CT imaging of the lower right extremity was performed according to the standard protocol following intravenous contrast administration. RADIATION DOSE REDUCTION: This exam was performed according to the departmental dose-optimization program which includes automated exposure control, adjustment of the mA and/or kV according to patient size and/or use of iterative reconstruction technique. CONTRAST:  75mL OMNIPAQUE  IOHEXOL  350 MG/ML SOLN COMPARISON:  None Available. FINDINGS: Bones/Joint/Cartilage Moderate joint effusion with synovial enhancement. There is a Engineer, production cyst tracking posteriorly. Moderate knee chondrocalcinosis. No fracture. No erosions or bony destructive change. No periostitis. The ankle mortise is preserved. Ligaments Suboptimally assessed by CT. Muscles and Tendons Tenosynovial fluid about the posterior tibial tendon at the ankle. Perifascial fluid about the posterior and anterior muscle compartments of the calf, and to a lesser extent the posterior thigh muscle  compartment. No organized or drainable fluid collection. Soft tissues Subcutaneous edema, more confluent near the ankle. No subcutaneous collection. The vascular structures appear patent. No soft tissue gas or radiopaque foreign body. IMPRESSION: 1. Moderate joint effusion with synovial enhancement. Etiology is indeterminate, however infection is considered. Consider fluid sampling. 2. Perifascial fluid about the posterior and anterior muscle compartments of the calf, and to a lesser extent the posterior thigh. No organized or drainable fluid collection. 3. Tenosynovial fluid about the posterior tibial tendon at the ankle. 4. Subcutaneous edema, more confluent near the ankle. No subcutaneous collection. 5. No acute osseous abnormality. 6. Chondrocalcinosis about the knee. Electronically Signed   By: Andrea Gasman M.D.   On: 10/03/2023 19:57   VAS US  LOWER EXTREMITY VENOUS (DVT) (ONLY [facility]) Result Date: 10/03/2023  Lower Venous DVT Study Patient Name:  Julie Nicholson  Date of Exam:   10/01/2023 Medical Rec #: [id number]         Accession #:    [id number] Date of Birth: 1965-07-18         Patient Gender: F Patient Age:   41 years Exam [facility] Procedure:      VAS US  LOWER EXTREMITY VENOUS (DVT) Referring Phys: LONNI CAMP --------------------------------------------------------------------------------  Indications: Muscle spasms, knee and posterior calf pain. Hit leg on dog crate yesterday, increased pain and swelling today.  Risk Factors: Cancer Metastatic stage IV non-small cell lung cancer. On palliative systemic chemotherapy and Limitations: Involuntary patient movement secondary to spasms and pain. Holding breath during spasms. Comparison Study: No prior study on file Performing Technologist: Alberta Lis RVS  Examination Guidelines: A complete evaluation includes B-mode imaging, spectral Doppler, color Doppler, and power Doppler as needed of all accessible portions of each vessel.  Bilateral testing is considered an integral part of a complete examination. Limited examinations for reoccurring indications may be performed as noted. The reflux portion of the exam is performed with the patient in reverse Trendelenburg.  +---------+---------------+---------+-----------+----------+-------------------+ RIGHT    CompressibilityPhasicitySpontaneityPropertiesThrombus Aging      +---------+---------------+---------+-----------+----------+-------------------+ CFV      Full           Yes      No                                       +---------+---------------+---------+-----------+----------+-------------------+  SFJ      Full                                                             +---------+---------------+---------+-----------+----------+-------------------+ FV Prox  Full           Yes      Yes                                      +---------+---------------+---------+-----------+----------+-------------------+ FV Mid   Full                                                             +---------+---------------+---------+-----------+----------+-------------------+ FV DistalFull           Yes      Yes                                      +---------+---------------+---------+-----------+----------+-------------------+ PFV      Full           Yes      Yes                                      +---------+---------------+---------+-----------+----------+-------------------+ POP                     Yes      Yes                  patent by color and                                                       Doppler             +---------+---------------+---------+-----------+----------+-------------------+ PTV      Full                                                             +---------+---------------+---------+-----------+----------+-------------------+ PERO     Full                                                              +---------+---------------+---------+-----------+----------+-------------------+   +----+---------------+---------+-----------+----------+--------------+ LEFTCompressibilityPhasicitySpontaneityPropertiesThrombus Aging +----+---------------+---------+-----------+----------+--------------+ CFV Full           Yes      Yes                                 +----+---------------+---------+-----------+----------+--------------+  SFJ Full                                                        +----+---------------+---------+-----------+----------+--------------+     Summary: RIGHT: - There is no evidence of deep vein thrombosis in the lower extremity.  - No cystic structure found in the popliteal fossa.  LEFT: - No evidence of common femoral vein obstruction.   *See table(s) above for measurements and observations. Electronically signed by Debby Robertson on 10/03/2023 at 11:27:25 AM.    Final      ASSESSMENT AND PLAN: This is a very pleasant 58 years old white female with stage IIIB (T1b, N3, M0) non-small cell lung cancer, adenocarcinoma diagnosed in November 2022 with no actionable mutation and negative PD-L1 expression. The patient completed a course of concurrent chemoradiation with weekly carboplatin  for AUC of 2 and paclitaxel  45 Mg/M2 status post 7 cycles.  She has been tolerating her treatment well except for the mild odynophagia and skin burns. She was also recently admitted to the hospital complaining of dysphagia and odynophagia secondary to radiation induced esophagitis.  She is feeling much better but continues to have residual dysphagia.  She is followed by gastroenterology and was seen by Dr. Teressa during her hospitalization. Her scan showed improvement of her disease. I recommended for the patient treatment with consolidation immunotherapy with Imfinzi  1500 Mg IV every 4 weeks.  Status post 3 cycles.  Last dose was given in April 2023.  Her treatment was discontinued secondary to  suspicious immunotherapy mediated pneumonitis with significant shortness of breath at that time and she was treated with a tapered dose of prednisone . Unfortunately her scan showed evidence for disease recurrence with enlargement of lower right cervical lymph nodes as well as mediastinal lymphadenopathy.  She has palpable right cervical lymphadenopathy. 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 The patient underwent ultrasound-guided core biopsy of the right supraclavicular lymph nodes but the final pathology showed necrotic tumor cells with complete coagulative necrosis with focal fibrous tissue and histiocytic reaction.  She also had MRI of the brain that showed no evidence of metastatic disease to the brain. The patient was seen by Dr. Shannon and started palliative radiotherapy to the enlarging right supraclavicular lymphadenopathy.  This was completed on October 28, 2021. The patient was found to have metastatic disease in February 2024 with several lymphadenopathy in the chest as well as supraclavicular, upper abdomen as well as suspicious small liver metastasis. 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  I will [facility] the tissue biopsy to [facility] for molecular studies. She is currently undergoing systemic chemotherapy with carboplatin  for AUC of 5, Alimta  500 Mg/M2 and Keytruda  200 Mg IV every 3 weeks status post 24 cycles.  Starting from cycle #3, I reduced her dose of carboplatin  to AUC of  4 and Alimta  400 Mg/M2.  Assessment and Plan Assessment & Plan Metastatic non-small cell lung cancer Metastatic  non-small cell lung cancer with no actionable mutation and negative PD-L1 expression. Currently on maintenance treatment with Alimta  and Keytruda  every three weeks, following initial palliative systemic chemo-immunotherapy with carboplatin , Alimta , and Keytruda . Blood counts are adequate for treatment continuation. Last imaging was in July, and a follow-up scan is due to assess disease status. - Resume maintenance treatment with Alimta  and Keytruda . - Order follow-up scan in two weeks to evaluate disease status.  Cellulitis of right lower extremity Recent cellulitis in the right lower extremity, characterized by swelling, redness, and itchiness, particularly in the right leg and knee. Hospitalized for one night and treated with Rocephin  and Zyvox , followed by home treatment with Augmentin  and Zyvox . Currently, the swelling has resolved, but there is residual soreness in the ankle and knee.  Right knee pain Persistent soreness in the right knee following recent cellulitis. No swelling currently, but discomfort remains.  Unintentional weight loss Reported weight loss, likely related to recent illness and antibiotic treatment. Weight loss is minimal, approximately one and a half pounds since the last visit. She was advised to call immediately if she has any other concerning symptoms in the interval.  The patient voices understanding of current disease status and treatment options and is in agreement with the current care plan.  All questions were answered. The patient knows to call the clinic with any problems, questions or concerns. We can certainly see the patient much sooner if necessary.  The total time spent in the appointment was 30 minutes.  Disclaimer: This note was dictated with voice recognition software. Similar sounding words can inadvertently be transcribed and may not be  corrected upon review.

## 2023-10-29 ENCOUNTER — Other Ambulatory Visit: Payer: Self-pay

## 2023-10-29 ENCOUNTER — Encounter (HOSPITAL_COMMUNITY): Payer: Self-pay | Admitting: Pharmacy Technician

## 2023-10-29 ENCOUNTER — Emergency Department (HOSPITAL_COMMUNITY)

## 2023-10-29 ENCOUNTER — Emergency Department (HOSPITAL_BASED_OUTPATIENT_CLINIC_OR_DEPARTMENT_OTHER)

## 2023-10-29 ENCOUNTER — Observation Stay (HOSPITAL_COMMUNITY)
Admission: EM | Admit: 2023-10-29 | Discharge: 2023-10-31 | Disposition: A | Discharge status: Home / Self Care | Attending: Internal Medicine | Admitting: Internal Medicine

## 2023-10-29 DIAGNOSIS — F32A Depression, unspecified: Secondary | ICD-10-CM | POA: Insufficient documentation

## 2023-10-29 DIAGNOSIS — K219 Gastro-esophageal reflux disease without esophagitis: Secondary | ICD-10-CM | POA: Diagnosis not present

## 2023-10-29 DIAGNOSIS — C3490 Malignant neoplasm of unspecified part of unspecified bronchus or lung: Secondary | ICD-10-CM | POA: Insufficient documentation

## 2023-10-29 DIAGNOSIS — D649 Anemia, unspecified: Secondary | ICD-10-CM | POA: Diagnosis not present

## 2023-10-29 DIAGNOSIS — Z8701 Personal history of pneumonia (recurrent): Secondary | ICD-10-CM | POA: Insufficient documentation

## 2023-10-29 DIAGNOSIS — R000 Tachycardia, unspecified: Secondary | ICD-10-CM | POA: Diagnosis not present

## 2023-10-29 DIAGNOSIS — E876 Hypokalemia: Secondary | ICD-10-CM | POA: Insufficient documentation

## 2023-10-29 DIAGNOSIS — M25461 Effusion, right knee: Secondary | ICD-10-CM | POA: Diagnosis not present

## 2023-10-29 DIAGNOSIS — M25561 Pain in right knee: Secondary | ICD-10-CM | POA: Diagnosis not present

## 2023-10-29 DIAGNOSIS — E785 Hyperlipidemia, unspecified: Secondary | ICD-10-CM | POA: Diagnosis not present

## 2023-10-29 DIAGNOSIS — E039 Hypothyroidism, unspecified: Secondary | ICD-10-CM | POA: Insufficient documentation

## 2023-10-29 DIAGNOSIS — M7989 Other specified soft tissue disorders: Principal | ICD-10-CM

## 2023-10-29 DIAGNOSIS — J449 Chronic obstructive pulmonary disease, unspecified: Secondary | ICD-10-CM | POA: Insufficient documentation

## 2023-10-29 DIAGNOSIS — M79604 Pain in right leg: Secondary | ICD-10-CM | POA: Diagnosis present

## 2023-10-29 DIAGNOSIS — I5021 Acute systolic (congestive) heart failure: Secondary | ICD-10-CM | POA: Insufficient documentation

## 2023-10-29 DIAGNOSIS — Z7982 Long term (current) use of aspirin: Secondary | ICD-10-CM | POA: Insufficient documentation

## 2023-10-29 DIAGNOSIS — Z23 Encounter for immunization: Secondary | ICD-10-CM | POA: Diagnosis not present

## 2023-10-29 DIAGNOSIS — C3491 Malignant neoplasm of unspecified part of right bronchus or lung: Secondary | ICD-10-CM | POA: Diagnosis present

## 2023-10-29 DIAGNOSIS — M11261 Other chondrocalcinosis, right knee: Secondary | ICD-10-CM | POA: Diagnosis present

## 2023-10-29 DIAGNOSIS — I5022 Chronic systolic (congestive) heart failure: Secondary | ICD-10-CM | POA: Diagnosis present

## 2023-10-29 LAB — COMPREHENSIVE METABOLIC PANEL WITH GFR
ALT: 33 U/L (ref 0–44)
AST: 52 U/L — ABNORMAL HIGH (ref 15–41)
Albumin: 3 g/dL — ABNORMAL LOW (ref 3.5–5.0)
Alkaline Phosphatase: 126 U/L (ref 38–126)
Anion gap: 12 (ref 5–15)
BUN: 13 mg/dL (ref 6–20)
CO2: 27 mmol/L (ref 22–32)
Calcium: 9.8 mg/dL (ref 8.9–10.3)
Chloride: 98 mmol/L (ref 98–111)
Creatinine, Ser: 1.23 mg/dL — ABNORMAL HIGH (ref 0.44–1.00)
GFR, Estimated: 51 mL/min — ABNORMAL LOW (ref >60)
Glucose, Bld: 156 mg/dL — ABNORMAL HIGH (ref 70–99)
Potassium: 3.3 mmol/L — ABNORMAL LOW (ref 3.5–5.1)
Sodium: 137 mmol/L (ref 135–145)
Total Bilirubin: 1 mg/dL (ref 0.0–1.2)
Total Protein: 7.3 g/dL (ref 6.5–8.1)

## 2023-10-29 LAB — CBC WITH DIFFERENTIAL/PLATELET
Basophils Absolute: 0 K/uL (ref 0.0–0.1)
Basophils Relative: 0 %
Eosinophils Absolute: 0.1 K/uL (ref 0.0–0.5)
Eosinophils Relative: 1 %
HCT: 35 % — ABNORMAL LOW (ref 36.0–46.0)
Hemoglobin: 11.5 g/dL — ABNORMAL LOW (ref 12.0–15.0)
Lymphocytes Relative: 12 %
Lymphs Abs: 0.6 K/uL — ABNORMAL LOW (ref 0.7–4.0)
MCH: 33.3 pg (ref 26.0–34.0)
MCHC: 32.9 g/dL (ref 30.0–36.0)
MCV: 101.4 fL — ABNORMAL HIGH (ref 80.0–100.0)
Monocytes Absolute: 0.1 K/uL (ref 0.1–1.0)
Monocytes Relative: 2 %
Neutro Abs: 4.3 K/uL (ref 1.7–7.7)
Neutrophils Relative %: 85 %
Platelets: 185 K/uL (ref 150–400)
RBC: 3.45 MIL/uL — ABNORMAL LOW (ref 3.87–5.11)
RDW: 15.1 % (ref 11.5–15.5)
WBC: 5 K/uL (ref 4.0–10.5)
nRBC: 0 % (ref 0.0–0.2)

## 2023-10-29 LAB — D-DIMER, QUANTITATIVE: D-Dimer, Quant: 1.04 ug{FEU}/mL — ABNORMAL HIGH (ref 0.00–0.50)

## 2023-10-29 LAB — I-STAT CG4 LACTIC ACID, ED: Lactic Acid, Venous: 0.6 mmol/L (ref 0.5–1.9)

## 2023-10-29 MED ORDER — MORPHINE SULFATE (PF) 4 MG/ML IV SOLN
4.0000 mg | Freq: Once | INTRAVENOUS | Status: AC
  Administered 2023-10-29: 4 mg via INTRAVENOUS
  Filled 2023-10-29: qty 1

## 2023-10-29 MED ORDER — SODIUM CHLORIDE 0.9 % IV BOLUS
500.0000 mL | Freq: Once | INTRAVENOUS | Status: AC
  Administered 2023-10-29: 500 mL via INTRAVENOUS

## 2023-10-29 MED ORDER — VANCOMYCIN HCL IN DEXTROSE 1-5 GM/200ML-% IV SOLN
1000.0000 mg | Freq: Once | INTRAVENOUS | Status: DC
Start: 2023-10-29 — End: 2023-10-30

## 2023-10-29 MED ORDER — IOHEXOL 350 MG/ML SOLN
75.0000 mL | Freq: Once | INTRAVENOUS | Status: AC | PRN
  Administered 2023-10-29: 75 mL via INTRAVENOUS

## 2023-10-29 MED ORDER — CEPHALEXIN 500 MG PO CAPS: 500.0000 mg | ORAL_CAPSULE | Freq: Two times a day (BID) | ORAL | 0 refills | Status: DC

## 2023-10-29 MED ORDER — OXYCODONE HCL 5 MG PO TABS: 5.0000 mg | ORAL_TABLET | Freq: Once | ORAL | Status: DC

## 2023-10-29 MED ORDER — ONDANSETRON HCL 4 MG/2ML IJ SOLN
4.0000 mg | Freq: Once | INTRAMUSCULAR | Status: AC
  Administered 2023-10-29: 4 mg via INTRAVENOUS
  Filled 2023-10-29: qty 2

## 2023-10-29 MED ORDER — OXYCODONE HCL 5 MG PO TABS
5.0000 mg | ORAL_TABLET | Freq: Once | ORAL | Status: AC
  Administered 2023-10-29: 5 mg via ORAL
  Filled 2023-10-29: qty 1

## 2023-10-29 MED ORDER — POTASSIUM CHLORIDE CRYS ER 20 MEQ PO TBCR
30.0000 meq | EXTENDED_RELEASE_TABLET | Freq: Once | ORAL | Status: AC
  Administered 2023-10-29: 30 meq via ORAL
  Filled 2023-10-29: qty 1

## 2023-10-29 MED ORDER — SODIUM CHLORIDE 0.9 % IV SOLN
2.0000 g | Freq: Once | INTRAVENOUS | Status: AC
  Administered 2023-10-29: 2 g via INTRAVENOUS
  Filled 2023-10-29: qty 12.5

## 2023-10-29 NOTE — Discharge Instructions (Addendum)
 I restarted you on Keflex for your presumed cellulitis.  Your CT scan did not show, evidence of PE.  There is no evidence of blood clot in your leg.  You have some ongoing changes in your lungs regarding your treatment but otherwise, no acute pathology was seen.

## 2023-10-29 NOTE — ED Triage Notes (Signed)
 Pt presents with pain and swelling to RLE. Seen for same recently and given abx with improvement in symptoms. Several days ago pain and swelling returned.

## 2023-10-29 NOTE — Progress Notes (Signed)
 VASCULAR LAB    Right lower extremity venous duplex has been performed.  See CV proc for preliminary results.  Gave verbal report to Dr. Simon LIS, [facility], RVT 10/29/2023, 6:01 PM

## 2023-10-29 NOTE — ED Notes (Addendum)
 While attempting to connect 12 lead for cardiac monitoring as ordered by MD, pt states I don't want it. There's nothing wrong with my heart. RN made aware.

## 2023-10-29 NOTE — Plan of Care (Incomplete)
 58 year old female with history of metastatic non-small cell lung cancer currently on chemotherapy, HFmrEF, COPD, depression, anemia, HLD, GERD.  Recent hospital admission 9/1-10/07/2023 for right lower extremity cellulitis, pseudogout of right knee, and worsening anemia requiring blood transfusion.  Patient presents to the ED today for evaluation of increasing pain and swelling of her right lower extremity.  Slightly tachycardic but remainder of vital signs stable.  Labs showing no leukocytosis, hemoglobin 11.5 (stable), potassium 3.3, creatinine 1.2 (baseline 0.8-1.0), AST 52 and remainder of LFTs normal, D-dimer 1.04, lactic acid normal, blood cultures ordered.  CT angiogram chest negative for PE.  Showing mild to moderate severely residual heterogeneous airspace opacity within the right lower lobe, decrease in severity in comparison to prior study.  Showing trace amount of right pleural fluid and small pericardial effusion.  Right lower extremity DVT study negative per preliminary report.  Patient was given morphine , Zofran , oxycodone , oral potassium, vancomycin , cefepime , and 500 mL normal saline.  EKG: Sinus rhythm, baseline wander.  No acute changes.

## 2023-10-29 NOTE — ED Provider Notes (Signed)
 [place] [facility]ENCY DEPARTMENT AT [facility] Provider Note   CSN: [id number] Arrival date & time: 10/29/23  1333     Patient presents with: Knee Pain   Julie Nicholson is a 58 y.o. female.    Knee Pain    Patient has a history of pancreatitis COPD, lung cancer, acid reflux, cellulitis.  Patient states last month she had an issue with leg swelling.  She had an ultrasound that did not show any DVT.  Patient ended up getting admitted to the hospital for inflammatory arthritis cellulitis.  Patient was discharged following day.  Patient states her symptoms are improved.  In the last day or so she started noticing increasing pain and swelling in her leg.  Patient states the swelling is making it hard for her to bend at the knee.  She is denying any new chest pain.  She has chronic breathing issues with her lung cancer but nothing acutely different.  She has not had any fevers or chills  Prior to Admission medications   Medication Sig Start Date End Date Taking? Authorizing Provider  acetaminophen  (TYLENOL ) 500 MG tablet Take 1,000 mg by mouth every 6 (six) hours as needed for moderate pain.    [provider]  albuterol  (PROVENTIL ) (2.5 MG/3ML) 0.083% nebulizer solution albuterol  sulfate 2.5 mg/3 mL (0.083 %) solution for nebulization  USE 1 VIAL IN NEBULIZER EVERY 6 HOURS AS NEEDED FOR WHEEZING OR SHORTNESS OF BREATH    [provider]  albuterol  (VENTOLIN  HFA) 108 (90 Base) MCG/ACT inhaler Inhale 1-2 puffs into the lungs every 6 (six) hours as needed for wheezing or shortness of breath. 09/23/22   Shelah Lamar RAMAN, MD  amoxicillin -clavulanate (AUGMENTIN ) 875-125 MG tablet Take 1 tablet by mouth 2 (two) times daily. 10/07/23   Will Almarie MATSU, MD  aspirin  EC 81 MG tablet Take 81 mg by mouth at bedtime.    [provider]  atorvastatin  (LIPITOR) 40 MG tablet Take 40 mg by mouth at bedtime.    [provider]  azelastine  (ASTELIN ) 0.1 % nasal spray  Place 1 spray into both nostrils 2 (two) times daily as needed. Use in each nostril as directed 11/12/22   Tobie Arleta SQUIBB, MD  benzonatate  (TESSALON ) 200 MG capsule Take 1 capsule (200 mg total) by mouth 3 (three) times daily as needed. 07/07/22   Sherrod Sherrod, MD  Biotin 5000 MCG TABS Take 5,000 mcg by mouth at bedtime.    [provider]  buPROPion  (WELLBUTRIN  XL) 150 MG 24 hr tablet Take 150 mg by mouth at bedtime. 04/12/22   [provider]  diclofenac  Sodium (VOLTAREN ) 1 % GEL Apply 4 g topically 4 (four) times daily. 10/01/23   Minnie Tinnie BRAVO, PA  famotidine  (PEPCID ) 20 MG tablet Take 1 tablet by mouth twice daily 09/19/23   [name], Robert S, MD  fluticasone  (FLONASE ) 50 MCG/ACT nasal spray Place 2 sprays into both nostrils daily. 04/21/23   Shelah Lamar RAMAN, MD  folic acid  (FOLVITE ) 1 MG tablet Take 1 tablet by mouth once daily 10/25/23   Sherrod Sherrod, MD  furosemide  (LASIX ) 20 MG tablet Take 20 mg by mouth as needed for fluid or edema.    [provider]  hydrocortisone  2.5 % ointment APPLY OINTMENT EXTERNALLY TWICE DAILY TO THE LOWER LEGS FOR UP TO 2 WEEKS MAXIMUM External; Duration: 30 Days    [provider]  levothyroxine  (SYNTHROID ) 25 MCG tablet Take 1 tablet (25 mcg total) by mouth daily  before breakfast. 09/02/23   Heilingoetter, Cassandra L, PA-C  lidocaine -prilocaine  (EMLA ) cream APPLY TO PORT-A-CATH 30 TO 60 MINUTES BEFORE TREATMENT 09/27/22   Sherrod Sherrod, MD  linaCLOtide (LINZESS PO) Take 1 tablet by mouth daily.    [provider]  linezolid  (ZYVOX ) 600 MG tablet Take 1 tablet (600 mg total) by mouth 2 (two) times daily. 10/07/23   Will Almarie MATSU, MD  loratadine  (CLARITIN ) 10 MG tablet Take 10 mg by mouth daily. 05/15/23   [provider]  Multiple Vitamins-Minerals (HAIR SKIN AND NAILS FORMULA PO) Take 1 tablet by mouth daily.    [provider]  polyethylene glycol powder (MIRALAX ) 17 GM/SCOOP powder Take 17 g  by mouth 2 (two) times daily as needed for mild constipation. 06/25/23   Gonfa, Taye T, MD  prochlorperazine  (COMPAZINE ) 10 MG tablet Take 1 tablet (10 mg total) by mouth every 6 (six) hours as needed. 12/29/22   Heilingoetter, Cassandra L, PA-C  rizatriptan (MAXALT-MLT) 10 MG disintegrating tablet Take 10 mg by mouth as needed for migraine. May repeat in 2 hours if needed    [provider]  senna-docusate (SENOKOT-S) 8.6-50 MG tablet Take 2 tablets by mouth 2 (two) times daily between meals as needed for moderate constipation. 06/25/23   Gonfa, Taye T, MD  sertraline  (ZOLOFT ) 50 MG tablet Take 1 tablet by mouth daily.    [provider]  Tiotropium Bromide-Olodaterol (STIOLTO RESPIMAT ) 2.5-2.5 MCG/ACT AERS Inhale 2 puffs into the lungs daily. 09/23/22   [name], Robert S, MD  triamcinolone cream (KENALOG) 0.1 % 1 Application. 07/22/22   [provider]    Allergies: Carboplatin     Review of Systems  Updated Vital Signs BP 122/77   Pulse (!) 109   Temp 98.7 F (37.1 C)   Resp 16   SpO2 100%   Physical Exam Vitals and nursing note reviewed.  Constitutional:      General: She is not in acute distress.    Appearance: She is well-developed.  HENT:     Head: Normocephalic and atraumatic.     Right Ear: External ear normal.     Left Ear: External ear normal.  Eyes:     General: No scleral icterus.       Right eye: No discharge.        Left eye: No discharge.     Conjunctiva/sclera: Conjunctivae normal.  Neck:     Trachea: No tracheal deviation.  Cardiovascular:     Rate and Rhythm: Normal rate.  Pulmonary:     Effort: Pulmonary effort is normal. No respiratory distress.     Breath sounds: No stridor.  Abdominal:     General: There is no distension.  Musculoskeletal:        General: Swelling and tenderness present. No deformity.     Cervical back: Neck supple.     Comments: Erythema and edema of the right lower extremity involving the calf behind the knee  below the foot, tender to palpation  Skin:    General: Skin is warm and dry.     Findings: No rash.  Neurological:     Mental Status: She is alert. Mental status is at baseline.     Cranial Nerves: No dysarthria or facial asymmetry.     Motor: No seizure activity.     (all labs ordered are listed, but only abnormal results are displayed) Labs Reviewed  COMPREHENSIVE METABOLIC PANEL WITH GFR - Abnormal; Notable for the following components:  Result Value   Potassium 3.3 (*)    Glucose, Bld 156 (*)    Creatinine, Ser 1.23 (*)    Albumin  3.0 (*)    AST 52 (*)    GFR, Estimated 51 (*)    All other components within normal limits  CBC WITH DIFFERENTIAL/PLATELET - Abnormal; Notable for the following components:   RBC 3.45 (*)    Hemoglobin 11.5 (*)    HCT 35.0 (*)    MCV 101.4 (*)    Lymphs Abs 0.6 (*)    All other components within normal limits  D-DIMER, QUANTITATIVE - Abnormal; Notable for the following components:   D-Dimer, Quant 1.04 (*)    All other components within normal limits  I-STAT CG4 LACTIC ACID, ED    EKG: None  Radiology: No results found.   Procedures   Medications Ordered in the ED  sodium chloride  0.9 % bolus 500 mL (has no administration in time range)  morphine  (PF) 4 MG/ML injection 4 mg (has no administration in time range)  ondansetron  (ZOFRAN ) injection 4 mg (has no administration in time range)    Clinical Course as of 10/29/23 1520  Sat Oct 29, 2023  1440 CBC with Differential(!) No leukocytosis noted [JK]  1440 D-dimer, quantitative(!) D-dimer is elevated [JK]    Clinical Course User Index [JK] Randol Simmonds, MD                                 Medical Decision Making Amount and/or Complexity of Data Reviewed Labs:  Decision-making details documented in ED Course.  Risk Prescription drug management.   Patient's exam is concerning for the possibility of cellulitis versus deep venous thrombosis.  Patient does not appear  systemically ill.  No leukocytosis noted.  Doppler ultrasound is pending.  Patient will be given a dose of IV fluids and pain medications.  Case turned over to Dr Simon at shift change.     Final diagnoses:  Leg swelling    ED Discharge Orders     None          Randol Simmonds, MD 10/29/23 1520

## 2023-10-29 NOTE — ED Provider Triage Note (Signed)
 Emergency Medicine Provider Triage Evaluation Note  Julie Nicholson , a 58 y.o. female  was evaluated in triage.  Pt complains of right lower extremity edema below the anterior aspect of the right knee, exquisitely tender to the same, previously evaluated for VTE without positive findings, further states that she had's concerns for possible cellulitis, recently on antibiotics for same.  Seen by hospitalist at home and referred to the ED for further assessment..  Review of Systems  Positive: As above Negative:   Physical Exam  BP 122/77   Pulse (!) 109   Temp 98.7 F (37.1 C)   Resp 16   SpO2 100%  Gen:   Awake, no distress   Resp:  Normal effort  MSK:   Moves extremities without difficulty, right lower extremity is edematous as well as erythematous and exquisitely tender. Other:    Medical Decision Making  Medically screening exam initiated at 1:47 PM.  Appropriate orders placed.  Julie Nicholson was informed that the remainder of the evaluation will be completed by another provider, this initial triage assessment does not replace that evaluation, and the importance of remaining in the ED until their evaluation is complete.  Initial order sets placed for VTE, ultrasound of the right lower extremity along with D-dimer specifically for evaluation of VTE, she does not have shortness of breath and she does not have any signs or concerns at this time for pulmonary embolism.   Julie Nicholson, [place] 10/29/23 1351

## 2023-10-29 NOTE — ED Notes (Signed)
 Patient transported to CT

## 2023-10-29 NOTE — ED Triage Notes (Signed)
 Pt was tx for R knee tendonitis 3 weeks prior.  C/o same today.

## 2023-10-29 NOTE — ED Provider Notes (Signed)
  Physical Exam  BP 122/77   Pulse (!) 109   Temp 98.7 F (37.1 C)   Resp 16   SpO2 100%   Physical Exam Vitals and nursing note reviewed.  Constitutional:      General: She is not in acute distress.    Appearance: She is well-developed.  HENT:     Head: Normocephalic and atraumatic.  Eyes:     Conjunctiva/sclera: Conjunctivae normal.  Cardiovascular:     Rate and Rhythm: Normal rate and regular rhythm.     Heart sounds: No murmur heard. Pulmonary:     Effort: Pulmonary effort is normal. No respiratory distress.     Breath sounds: Normal breath sounds.  Abdominal:     Palpations: Abdomen is soft.     Tenderness: There is no abdominal tenderness.  Musculoskeletal:        General: No swelling.     Cervical back: Neck supple.  Skin:    General: Skin is warm and dry.     Capillary Refill: Capillary refill takes less than 2 seconds.  Neurological:     Mental Status: She is alert.  Psychiatric:        Mood and Affect: Mood normal.     Procedures  Procedures  ED Course / MDM   Clinical Course as of 10/29/23 1540  Sat Oct 29, 2023  1440 CBC with Differential(!) No leukocytosis noted [JK]  1440 D-dimer, quantitative(!) D-dimer is elevated [JK]    Clinical Course User Index [JK] Randol Simmonds, MD   Medical Decision Making Amount and/or Complexity of Data Reviewed Labs:  Decision-making details documented in ED Course. Radiology: ordered.  Risk Prescription drug management. Decision regarding hospitalization.     Patient presented because of presented because of leg swelling.  History of treatment of cellulitis in the same leg.  History of lung cancer as well.  Pending DVT ultrasound.  If negative, will plan on antibiotics.  No chest pain or hemoptysis.  No concerns for PE at this time.    D-dimer was positive.  Obtain DVT ultrasound.  Unremarkable for DVT.  She remained tachycardic.  Therefore, did obtain CTA of the chest given lung cancer history.  She was  ongoing consolidation in the right lower lung where she has been treated for possible aspiration pneumonia versus typical pneumonia.  Given ongoing tachycardia as well as patient's comorbidities, and recent hospital admission, started patient on broad-spectrum antibiotics and patient will be placed in observation given ongoing tachycardia in the setting of this infiltrate.   Julie Lavonia SAILOR, MD 10/29/23 [phone]

## 2023-10-29 NOTE — H&P (Signed)
 History and Physical    KALIYAN OSBOURN FMW:[id number] DOB: 04/26/1965 DOA: 10/29/2023  PCP: Cristopher Suzen HERO, NP  Patient coming from: Home  Chief Complaint: Right lower extremity pain and swelling  HPI: Julie Nicholson is a 58 y.o. female with medical history significant of metastatic non-small cell lung cancer currently on chemotherapy, HFmrEF, COPD, depression, anemia, HLD, GERD.  Recent hospital admission 9/1-10/07/2023 for right lower extremity cellulitis, pseudogout of right knee, and worsening anemia requiring blood transfusion.  Patient presents to the ED today for evaluation of increasing pain and swelling of her right lower extremity.    ED Course: Slightly tachycardic but remainder of vital signs stable. Labs showing no leukocytosis, hemoglobin 11.5 (stable), potassium 3.3, creatinine 1.2 (baseline 0.8-1.0), AST 52 and remainder of LFTs normal, D-dimer 1.04, lactic acid normal, blood cultures ordered. CT angiogram chest negative for PE. Showing mild to moderate severely residual heterogeneous airspace opacity within the right lower lobe, decreased in severity in comparison to prior study. Showing trace amount of right pleural fluid and small pericardial effusion. Right lower extremity DVT study negative per preliminary report. Patient was given morphine , Zofran , oxycodone , oral potassium, vancomycin , cefepime , and 500 mL normal saline.   Review of Systems:  ROS  Past Medical History:  Diagnosis Date   Abdominal discomfort    Acute sinusitis 11/11/2021   Adenocarcinoma of right lung, stage 4 (HCC) 12/18/2020   Allergic rhinitis 05/08/2021   Antineoplastic chemotherapy induced pancytopenia 02/23/2021   Aortic atherosclerosis 06/24/2023   Cancer (HCC)    Chronic cholecystitis 03/13/2013   Chronic headaches    due to allergies, sinus   Chronic venous hypertension (idiopathic) with other complications of bilateral lower extremity    Constipation 06/23/2023   COPD (chronic  obstructive pulmonary disease) (HCC)    per 2012 chest xray   pt states she doesn not have this now (04/10/2013)   COVID-19 virus infection 02/24/2021   Deviated nasal septum    Dyspnea 04/07/2021   Encounter for antineoplastic chemotherapy 12/18/2020   Encounter for antineoplastic immunotherapy 11/17/2022   Eustachian tube dysfunction    GERD (gastroesophageal reflux disease)    occasional uses Tums / Rolaids   Goals of care, counseling/discussion 03/18/2021   Hearing loss    right ear   HFrEF (heart failure with reduced ejection fraction) (HCC) 07/13/2023   High cholesterol    History of radiation therapy    right lung 01/07/2021-02/19/2021  Dr Lynwood Nasuti   History of radiation therapy    Lymph nodes of head,face,neck- 12/23/22-01/07/23- Dr. 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   RUQ pain 03/06/2013   EUS, EGD negative  HIDA scan with ejection fraction shows increased ejection fraction  No gallstones on ultrasound or EUS     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   Tobacco use 12/09/2020   Upper airway cough syndrome 05/08/2021    Past Surgical History:  Procedure Laterality Date   ABDOMINAL HYSTERECTOMY  1995   tx endometriosis, both ovaries removed   ADENOIDECTOMY     APPENDECTOMY  late 1990's   BIOPSY  02/26/2021   Procedure: BIOPSY;  Surgeon: Teressa Toribio SQUIBB, MD;  Location: [facility];  Service: Endoscopy;;   BRONCHIAL BRUSHINGS  12/15/2020   Procedure: BRONCHIAL BRUSHINGS;  Surgeon: Shelah Lamar RAMAN, MD;  Location: [facility] ENDOSCOPY;  Service: Cardiopulmonary;;    BRONCHIAL NEEDLE ASPIRATION BIOPSY  12/15/2020   Procedure: BRONCHIAL NEEDLE ASPIRATION BIOPSIES;  Surgeon: Shelah Lamar RAMAN, MD;  Location: [facility] ENDOSCOPY;  Service: Cardiopulmonary;;   CHOLECYSTECTOMY  04/10/2013   CHOLECYSTECTOMY N/A 04/10/2013   Procedure: LAPAROSCOPIC CHOLECYSTECTOMY WITH INTRAOPERATIVE CHOLANGIOGRAM;  Surgeon: Krystal JINNY Russell, MD;  Location: [facility] OR;  Service: General;  Laterality: N/A;   ELECTROMAGNETIC NAVIGATION BROCHOSCOPY  12/15/2020   Procedure: ELECTROMAGNETIC NAVIGATION BRONCHOSCOPY;  Surgeon: Shelah Lamar RAMAN, MD;  Location: [facility] ENDOSCOPY;  Service: Cardiopulmonary;;   ESOPHAGOGASTRODUODENOSCOPY (EGD) WITH PROPOFOL  N/A 02/26/2021   Procedure: ESOPHAGOGASTRODUODENOSCOPY (EGD) WITH PROPOFOL ;  Surgeon: Teressa Toribio SQUIBB, MD;  Location: [facility] END[facility]COPY;  Service: Endoscopy;  Laterality: N/A;   EUS N/A 02/16/2013   Procedure: UPPER ENDOSCOPIC ULTRASOUND (EUS) LINEAR;  Surgeon: Toribio SQUIBB Teressa, MD;  Location: [facility] ENDOSCOPY;  Service: Endoscopy;  Laterality: N/A;  radial linear   IR IMAGING GUIDED PORT INSERTION  06/30/2022   KNEE ARTHROSCOPY Right 1980's   cartilage OR   LAPAROSCOPIC ENDOMETRIOSIS FULGURATION  1980's   MYRINGOTOMY WITH TUBE PLACEMENT Right 07/13/2018   Procedure: MYRINGOTOMY WITH TUBE PLACEMENT;  Surgeon: Edda Mt, MD;  Location: [facility];  Service: ENT;  Laterality: Right;   MYRINGOTOMY WITH TUBE PLACEMENT Right 06/04/2021   Procedure: MYRINGOTOMY WITH BUTTERFLY TUBE PLACEMENT;  Surgeon: Edda Mt, MD;  Location: [facility];  Service: ENT;  Laterality: Right;   NASOPHARYNGOSCOPY EUSTATION TUBE BALLOON DILATION Right 07/13/2018   Procedure: NASOPHARYNGOSCOPY EUSTATION TUBE BALLOON DILATION;  Surgeon: Edda Mt, MD;  Location: [facility];  Service: ENT;  Laterality: Right;   TONSILLECTOMY AND ADENOIDECTOMY  ~ 1980   adenoidectomy   TUBAL LIGATION  ~ 1987   TURBINATE REDUCTION Right 07/13/2018   Procedure:  OUTFRACTURE TURBINATE;  Surgeon: Edda Mt, MD;  Location: [facility];  Service: ENT;  Laterality: Right;   TYMPANOSTOMY TUBE PLACEMENT     VIDEO BRONCHOSCOPY WITH ENDOBRONCHIAL ULTRASOUND N/A 12/15/2020   Procedure: ROBOTIC VIDEO BRONCHOSCOPY WITH ENDOBRONCHIAL ULTRASOUND;  Surgeon: Shelah Lamar RAMAN, MD;  Location: [facility] ENDOSCOPY;  Service: Cardiopulmonary;  Laterality: N/A;   WRIST SURGERY Left    w/plate     reports that she quit smoking about 2 years ago. Her smoking use included cigarettes. She started smoking about 43 years ago. She has a 20.5 pack-year smoking history. She has been exposed to tobacco smoke. She has never used smokeless tobacco. She reports that she does not currently use alcohol. She reports that she does not use drugs.  Allergies  Allergen Reactions   Carboplatin  Shortness Of Breath, Itching and Cough    See progress note from 06/16/22    Family History  Problem Relation Age of Onset   Diabetes Mother    COPD Mother    Rectal cancer Mother    Cervical cancer Mother    Diabetes Father    COPD Maternal Aunt    COPD Maternal Uncle    COPD Maternal Grandfather     Prior to Admission medications   Medication Sig Start Date End Date Taking? Authorizing Provider  cephALEXin (KEFLEX) 500 MG capsule Take 1 capsule (500 mg total) by mouth 2 (two) times daily for 10 days. 10/29/23 11/08/23 Yes [name], MD  acetaminophen  (  TYLENOL ) 500 MG tablet Take 1,000 mg by mouth every 6 (six) hours as needed for moderate pain.    [provider]  albuterol  (PROVENTIL ) (2.5 MG/3ML) 0.083% nebulizer solution albuterol  sulfate 2.5 mg/3 mL (0.083 %) solution for nebulization  USE 1 VIAL IN NEBULIZER EVERY 6 HOURS AS NEEDED FOR WHEEZING OR SHORTNESS OF BREATH    [provider]  albuterol  (VENTOLIN  HFA) 108 (90 Base) MCG/ACT inhaler Inhale 1-2 puffs into the lungs every 6 (six) hours as needed for wheezing or shortness of breath. 09/23/22   Shelah Lamar RAMAN, MD  amoxicillin -clavulanate (AUGMENTIN ) 875-125 MG tablet Take 1 tablet by mouth 2 (two) times daily. 10/07/23   Will Almarie MATSU, MD  aspirin  EC 81 MG tablet Take 81 mg by mouth at bedtime.    [provider]  atorvastatin  (LIPITOR) 40 MG tablet Take 40 mg by mouth at bedtime.    [provider]  azelastine  (ASTELIN ) 0.1 % nasal spray Place 1 spray into both nostrils 2 (two) times daily as needed. Use in each nostril as directed 11/12/22   Tobie Arleta SQUIBB, MD  benzonatate  (TESSALON ) 200 MG capsule Take 1 capsule (200 mg total) by mouth 3 (three) times daily as needed. 07/07/22   Sherrod Sherrod, MD  Biotin 5000 MCG TABS Take 5,000 mcg by mouth at bedtime.    [provider]  buPROPion  (WELLBUTRIN  XL) 150 MG 24 hr tablet Take 150 mg by mouth at bedtime. 04/12/22   [provider]  diclofenac  Sodium (VOLTAREN ) 1 % GEL Apply 4 g topically 4 (four) times daily. 10/01/23   Minnie Tinnie BRAVO, PA  famotidine  (PEPCID ) 20 MG tablet Take 1 tablet by mouth twice daily 09/19/23   [name], Robert S, MD  fluticasone  (FLONASE ) 50 MCG/ACT nasal spray Place 2 sprays into both nostrils daily. 04/21/23   Shelah Lamar RAMAN, MD  folic acid  (FOLVITE ) 1 MG tablet Take 1 tablet by mouth once daily 10/25/23   Sherrod Sherrod, MD  furosemide  (LASIX ) 20 MG tablet Take 20 mg by mouth as needed for fluid or edema.    [provider]  hydrocortisone  2.5 % ointment APPLY OINTMENT EXTERNALLY TWICE DAILY TO THE LOWER LEGS FOR UP TO 2 WEEKS MAXIMUM External; Duration: 30 Days    [provider]  levothyroxine  (SYNTHROID ) 25 MCG tablet Take 1 tablet (25 mcg total) by mouth daily before breakfast. 09/02/23   Heilingoetter, Cassandra L, PA-C  lidocaine -prilocaine  (EMLA ) cream APPLY TO PORT-A-CATH 30 TO 60 MINUTES BEFORE TREATMENT 09/27/22   Sherrod Sherrod, MD  linaCLOtide (LINZESS PO) Take 1 tablet by mouth daily.    [provider]  linezolid  (ZYVOX ) 600 MG tablet Take 1 tablet  (600 mg total) by mouth 2 (two) times daily. 10/07/23   Will Almarie MATSU, MD  loratadine  (CLARITIN ) 10 MG tablet Take 10 mg by mouth daily. 05/15/23   [provider]  Multiple Vitamins-Minerals (HAIR SKIN AND NAILS FORMULA PO) Take 1 tablet by mouth daily.    [provider]  polyethylene glycol powder (MIRALAX ) 17 GM/SCOOP powder Take 17 g by mouth 2 (two) times daily as needed for mild constipation. 06/25/23   Gonfa, Taye T, MD  prochlorperazine  (COMPAZINE ) 10 MG tablet Take 1 tablet (10 mg total) by mouth every 6 (six) hours as needed. 12/29/22   Heilingoetter, Cassandra L, PA-C  rizatriptan (MAXALT-MLT) 10 MG disintegrating tablet Take 10 mg by mouth as needed for migraine. May repeat in 2 hours if needed  [provider]  senna-docusate (SENOKOT-S) 8.6-50 MG tablet Take 2 tablets by mouth 2 (two) times daily between meals as needed for moderate constipation. 06/25/23   Gonfa, Taye T, MD  sertraline  (ZOLOFT ) 50 MG tablet Take 1 tablet by mouth daily.    [provider]  Tiotropium Bromide-Olodaterol (STIOLTO RESPIMAT ) 2.5-2.5 MCG/ACT AERS Inhale 2 puffs into the lungs daily. 09/23/22   [name], Robert S, MD  triamcinolone cream (KENALOG) 0.1 % 1 Application. 07/22/22   [provider]    Physical Exam: Vitals:   10/29/23 1945 10/29/23 2015 10/29/23 2130 10/29/23 2145  BP: 111/61 (!) 119/58 115/64 120/68  Pulse: (!) 105 (!) 108 (!) 102 (!) 103  Resp: 16 19 16 14   Temp:      TempSrc:      SpO2: 100% 100% 99% 97%    Physical Exam  Labs on Admission: I have personally reviewed following labs and imaging studies  CBC: Recent Labs  Lab 10/26/23 1025 10/29/23 1354  WBC 3.6* 5.0  NEUTROABS 2.2 4.3  HGB 11.6* 11.5*  HCT 34.6* 35.0*  MCV 99.1 101.4*  PLT 203 185   Basic Metabolic Panel: Recent Labs  Lab 10/26/23 1025 10/29/23 1354  NA 141 137  K 3.3* 3.3*  CL 105 98  CO2 29 27  GLUCOSE 144* 156*  BUN 13 13  CREATININE 0.96 1.23*   CALCIUM  9.5 9.8   GFR: Estimated Creatinine Clearance: 33.6 mL/min (A) (by C-G formula based on SCr of 1.23 mg/dL (H)). Liver Function Tests: Recent Labs  Lab 10/26/23 1025 10/29/23 1354  AST 23 52*  ALT 14 33  ALKPHOS 127* 126  BILITOT 0.5 1.0  PROT 7.1 7.3  ALBUMIN  3.6 3.0*   No results for input(s): LIPASE, AMYLASE in the last 168 hours. No results for input(s): AMMONIA in the last 168 hours. Coagulation Profile: No results for input(s): INR, PROTIME in the last 168 hours. Cardiac Enzymes: No results for input(s): CKTOTAL, CKMB, CKMBINDEX, TROPONINI in the last 168 hours. BNP (last 3 results) No results for input(s): PROBNP in the last 8760 hours. HbA1C: No results for input(s): HGBA1C in the last 72 hours. CBG: No results for input(s): GLUCAP in the last 168 hours. Lipid Profile: No results for input(s): CHOL, HDL, LDLCALC, TRIG, CHOLHDL, LDLDIRECT in the last 72 hours. Thyroid  Function Tests: No results for input(s): TSH, T4TOTAL, FREET4, T3FREE, THYROIDAB in the last 72 hours. Anemia Panel: No results for input(s): VITAMINB12, FOLATE, FERRITIN, TIBC, IRON, RETICCTPCT in the last 72 hours. Urine analysis:    Component Value Date/Time   COLORURINE YELLOW 06/23/2023 1111   APPEARANCEUR CLEAR 06/23/2023 1111   LABSPEC 1.015 06/23/2023 1111   PHURINE 7.0 06/23/2023 1111   GLUCOSEU NEGATIVE 06/23/2023 1111   GLUCOSEU NEGATIVE 03/01/2013 0733   HGBUR NEGATIVE 06/23/2023 1111   BILIRUBINUR NEGATIVE 06/23/2023 1111   KETONESUR NEGATIVE 06/23/2023 1111   PROTEINUR NEGATIVE 06/23/2023 1111   UROBILINOGEN 0.2 03/01/2013 0733   NITRITE NEGATIVE 06/23/2023 1111   LEUKOCYTESUR NEGATIVE 06/23/2023 1111    Radiological Exams on Admission: CT Angio Chest PE W and/or Wo Contrast Result Date: 10/29/2023 CLINICAL DATA:  Suspected pulmonary embolism. EXAM: CT ANGIOGRAPHY CHEST WITH CONTRAST TECHNIQUE: Multidetector CT  imaging of the chest was performed using the standard protocol during bolus administration of intravenous contrast. Multiplanar CT image reconstructions and MIPs were obtained to evaluate the vascular anatomy. RADIATION DOSE REDUCTION: This exam was performed according to the departmental dose-optimization program which includes automated exposure control,  adjustment of the mA and/or kV according to patient size and/or use of iterative reconstruction technique. CONTRAST:  75mL OMNIPAQUE  IOHEXOL  350 MG/ML SOLN COMPARISON:  August 18, 2023 FINDINGS: Cardiovascular: A right-sided venous Port-A-Cath is in place. The thoracic aorta is normal in appearance, without evidence of aortic aneurysm or dissection. Satisfactory opacification of the pulmonary arteries to the segmental level. No evidence of pulmonary embolism. Normal heart size. There is a small pericardial effusion. Mediastinum/Nodes: Stable areas of treated mediastinal and right hilar soft tissue attenuation are seen without evidence of enlarged mediastinal, hilar or axillary lymph nodes. There is moderate severity diffuse circumferential mural thickening along the length of the esophagus. The thyroid  gland and trachea demonstrate no significant findings. Lungs/Pleura: There is extensive emphysematous lung disease with stable diffuse bronchial wall thickening. An area of mild to moderate severity, residual heterogeneous airspace opacity is seen within the right lower lobe. This is decreased in severity when compared to the prior study. There is stable right perihilar and right suprahilar post treatment radiation fibrosis and consolidation. A trace amount of pleural fluid is seen on the right. No pneumothorax is identified. Upper Abdomen: Multiple surgical clips are seen within the gallbladder fossa. Musculoskeletal: No chest wall abnormality. No acute or significant osseous findings. Review of the MIP images confirms the above findings. IMPRESSION: 1. No evidence  of pulmonary embolism. 2. 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 3. Mild to moderate severity, residual heterogeneous airspace opacity within the right lower lobe, decreased in severity when compared to the prior study. 4. Trace amount of right pleural fluid. 5. Small pericardial effusion. 6. Moderate severity diffuse circumferential mural thickening along the length of the esophagus, which may represent sequelae associated with esophagitis. 7. Evidence of prior cholecystectomy. 8. Emphysema. Electronically Signed   By: Suzen Dials M.D.   On: 10/29/2023 21:10   VAS US  LOWER EXTREMITY VENOUS (DVT) (7a-7p) Result Date: 10/29/2023  Lower Venous DVT Study Patient Name:  AKINA MAISH  Date of Exam:   10/29/2023 Medical Rec #: [id number]         Accession #:    [id number] Date of Birth: [birth year]         Patient Gender: F Patient Age:   9 years Exam Location:  [facility] Procedure:      VAS US  LOWER EXTREMITY VENOUS (DVT) Referring Phys: DORN DEC --------------------------------------------------------------------------------  Indications: Pain, Swelling, and Muscle spasms.  Limitations: Edema and pain with compression. Comparison Study: Prior negative right LEV done 10/01/2023 Performing Technologist: Alberta Lis RVS  Examination Guidelines: A complete evaluation includes B-mode imaging, spectral Doppler, color Doppler, and power Doppler as needed of all accessible portions of each vessel. Bilateral testing is considered an integral part of a complete examination. Limited examinations for reoccurring indications may be performed as noted. The reflux portion of the exam is performed with the patient in reverse Trendelenburg.  +---------+---------------+---------+-----------+----------+-------------------+ RIGHT    CompressibilityPhasicitySpontaneityPropertiesThrombus Aging       +---------+---------------+---------+-----------+----------+-------------------+ CFV      Full           Yes      Yes                                      +---------+---------------+---------+-----------+----------+-------------------+ SFJ      Full                                                             +---------+---------------+---------+-----------+----------+-------------------+  FV Prox  Full           Yes      Yes                                      +---------+---------------+---------+-----------+----------+-------------------+ FV Mid   Full           Yes      Yes                                      +---------+---------------+---------+-----------+----------+-------------------+ FV DistalFull           Yes      Yes                                      +---------+---------------+---------+-----------+----------+-------------------+ PFV      Full           Yes      Yes                                      +---------+---------------+---------+-----------+----------+-------------------+ POP                     Yes      Yes                  patent by color and                                                       Doppler             +---------+---------------+---------+-----------+----------+-------------------+ PTV      Full                                                             +---------+---------------+---------+-----------+----------+-------------------+ PERO     Full                                                             +---------+---------------+---------+-----------+----------+-------------------+   +----+---------------+---------+-----------+----------+--------------+ LEFTCompressibilityPhasicitySpontaneityPropertiesThrombus Aging +----+---------------+---------+-----------+----------+--------------+ CFV Full           Yes      Yes                                  +----+---------------+---------+-----------+----------+--------------+ SFJ Full           Yes      Yes                                 +----+---------------+---------+-----------+----------+--------------+  Summary: RIGHT: - Findings appear essentially unchanged compared to previous examination. - There is no evidence of deep vein thrombosis in the lower extremity.  - No cystic structure found in the popliteal fossa. - Ultrasound characteristics of enlarged lymph nodes are noted in the groin.  LEFT: - No evidence of common femoral vein obstruction.  - Ultrasound characteristics of enlarged lymph nodes noted in the groin.  *See table(s) above for measurements and observations.    Preliminary     EKG: Independently reviewed. Sinus rhythm, baseline wander. No acute changes.   Assessment and Plan  Right lower extremity pain, swelling, and erythema Patient was admitted earlier this month and treated for right lower extremity cellulitis and pseudogout of right knee.  Underwent arthrocentesis at that time and synovial fluid analysis was negative for septic joint.  She presents today due to worsening pain, erythema, and swelling of her right lower extremity.  Right lower extremity DVT study negative per preliminary report.  No fever, leukocytosis, or lactic acidosis.  Continue antibiotic coverage with vancomycin  and cefepime  for possible ongoing cellulitis.  Follow-up blood cultures.  Possible aspiration pneumonia CT showing mild to moderate severely residual heterogeneous airspace opacity within the right lower lobe although decreased in severity in comparison to prior study.  Not hypoxic.  Continue antibiotics as above.  Keep n.p.o. for now, aspiration precautions, SLP eval.  Sinus tachycardia CT angiogram chest negative for PE.  Mild hypokalemia Monitor potassium and magnesium  levels, continue to replace as needed.  Chronic anemia Hemoglobin stable.  Metastatic non-small cell lung  cancer Currently on chemotherapy.  Outpatient oncology follow-up.  Chronic HFmrEF Echo done in May 2025 showing EF 40 to 45%, grade 1 diastolic dysfunction, trivial mitral regurgitation, and mild aortic regurgitation.  Repeat echocardiogram ordered as CT done today showing small pericardial effusion.  COPD  Depression  Hyperlipidemia  GERD      DVT prophylaxis: {Blank single:19197::Lovenox ,SQ Heparin ,IV heparin  gtt,Xarelto,Eliquis,Coumadin,SCDs,***} Code Status: {Blank single:19197::Full Code,DNR,DNR/DNI,Comfort Care,***} Family Communication: ***  Consults called: ***  Level of care: {Blank single:19197::Med-Surg,Telemetry bed,Progressive Care Unit,Step Down Unit} Admission status: *** Time Spent: 75+ minutes***  Editha Ram MD [facility]  If 7PM-7AM, please contact night-coverage [email or web address]  10/29/2023, 10:46 PM

## 2023-10-29 NOTE — ED Notes (Signed)
 This nurse called [facility] to see they are ready for patient to be transported up. Floor stated they are ready for patient.

## 2023-10-29 NOTE — Progress Notes (Signed)
 ED Pharmacy Antibiotic Sign Off An antibiotic consult was received from an ED provider for vancomycin  per pharmacy dosing for pneumonia. A chart review was completed to assess appropriateness.   The following one time order(s) were placed:  Vancomycin  1g IV once   Further antibiotic and/or antibiotic pharmacy consults should be ordered by the admitting provider if indicated.   Thank you for allowing pharmacy to participate in this patient's care,  Suzen Sour, PharmD, BCCCP Clinical Pharmacist  Phone: [phone] 10/29/2023 10:22 PM  Please check AMION for all [facility] phone numbers After 10:00 PM, call Main Pharmacy [phone]

## 2023-10-30 ENCOUNTER — Other Ambulatory Visit (HOSPITAL_COMMUNITY)

## 2023-10-30 ENCOUNTER — Encounter (HOSPITAL_COMMUNITY): Payer: Self-pay | Admitting: Internal Medicine

## 2023-10-30 ENCOUNTER — Observation Stay (HOSPITAL_COMMUNITY)

## 2023-10-30 DIAGNOSIS — E876 Hypokalemia: Secondary | ICD-10-CM

## 2023-10-30 DIAGNOSIS — R000 Tachycardia, unspecified: Secondary | ICD-10-CM

## 2023-10-30 DIAGNOSIS — C3490 Malignant neoplasm of unspecified part of unspecified bronchus or lung: Secondary | ICD-10-CM | POA: Diagnosis not present

## 2023-10-30 DIAGNOSIS — M25461 Effusion, right knee: Secondary | ICD-10-CM

## 2023-10-30 DIAGNOSIS — C3491 Malignant neoplasm of unspecified part of right bronchus or lung: Secondary | ICD-10-CM

## 2023-10-30 DIAGNOSIS — M11261 Other chondrocalcinosis, right knee: Secondary | ICD-10-CM

## 2023-10-30 LAB — SYNOVIAL CELL COUNT + DIFF, W/ CRYSTALS
Crystals, Fluid: NONE SEEN
Eosinophils-Synovial: 6 % — ABNORMAL HIGH (ref 0–1)
Lymphocytes-Synovial Fld: 7 % (ref 0–20)
Monocyte-Macrophage-Synovial Fluid: 4 % — ABNORMAL LOW (ref 50–90)
Neutrophil, Synovial: 83 % — ABNORMAL HIGH (ref 0–25)
WBC, Synovial: 8550 /mm3 — ABNORMAL HIGH (ref 0–200)

## 2023-10-30 LAB — COMPREHENSIVE METABOLIC PANEL WITH GFR
ALT: 112 U/L — ABNORMAL HIGH (ref 0–44)
AST: 183 U/L — ABNORMAL HIGH (ref 15–41)
Albumin: 2.3 g/dL — ABNORMAL LOW (ref 3.5–5.0)
Alkaline Phosphatase: 184 U/L — ABNORMAL HIGH (ref 38–126)
Anion gap: 6 (ref 5–15)
BUN: 10 mg/dL (ref 6–20)
CO2: 24 mmol/L (ref 22–32)
Calcium: 8.6 mg/dL — ABNORMAL LOW (ref 8.9–10.3)
Chloride: 105 mmol/L (ref 98–111)
Creatinine, Ser: 0.74 mg/dL (ref 0.44–1.00)
GFR, Estimated: >60 mL/min (ref >60)
Glucose, Bld: 91 mg/dL (ref 70–99)
Potassium: 3.6 mmol/L (ref 3.5–5.1)
Sodium: 135 mmol/L (ref 135–145)
Total Bilirubin: 0.9 mg/dL (ref 0.0–1.2)
Total Protein: 6.1 g/dL — ABNORMAL LOW (ref 6.5–8.1)

## 2023-10-30 LAB — SEDIMENTATION RATE: Sed Rate: 100 mm/h — ABNORMAL HIGH (ref 0–22)

## 2023-10-30 LAB — C-REACTIVE PROTEIN: CRP: 9.5 mg/dL — ABNORMAL HIGH (ref <1.0)

## 2023-10-30 LAB — MAGNESIUM: Magnesium: 1.4 mg/dL — ABNORMAL LOW (ref 1.7–2.4)

## 2023-10-30 MED ORDER — OXYCODONE HCL 5 MG PO TABS
5.0000 mg | ORAL_TABLET | Freq: Four times a day (QID) | ORAL | Status: DC | PRN
  Administered 2023-10-30 – 2023-10-31 (×5): 5 mg via ORAL
  Filled 2023-10-30 (×5): qty 1

## 2023-10-30 MED ORDER — SODIUM CHLORIDE 0.9 % IV SOLN
2.0000 g | Freq: Two times a day (BID) | INTRAVENOUS | Status: DC
  Administered 2023-10-30 – 2023-10-31 (×3): 2 g via INTRAVENOUS
  Filled 2023-10-30 (×3): qty 12.5

## 2023-10-30 MED ORDER — VANCOMYCIN HCL IN DEXTROSE 1-5 GM/200ML-% IV SOLN
1000.0000 mg | Freq: Once | INTRAVENOUS | Status: AC
Start: 2023-10-30 — End: 2023-10-30
  Administered 2023-10-30: 1000 mg via INTRAVENOUS
  Filled 2023-10-30: qty 200

## 2023-10-30 MED ORDER — ACETAMINOPHEN 650 MG RE SUPP: 650.0000 mg | Freq: Four times a day (QID) | RECTAL | Status: DC | PRN

## 2023-10-30 MED ORDER — SERTRALINE HCL 25 MG PO TABS
50.0000 mg | ORAL_TABLET | Freq: Every day | ORAL | Status: DC
  Administered 2023-10-30 – 2023-10-31 (×2): 50 mg via ORAL
  Filled 2023-10-30 (×2): qty 2

## 2023-10-30 MED ORDER — SODIUM CHLORIDE 0.9 % IV SOLN: INTRAVENOUS | Status: DC

## 2023-10-30 MED ORDER — VANCOMYCIN HCL 500 MG/100ML IV SOLN
500.0000 mg | INTRAVENOUS | Status: DC
  Administered 2023-10-31: 500 mg via INTRAVENOUS
  Filled 2023-10-30: qty 100

## 2023-10-30 MED ORDER — UMECLIDINIUM BROMIDE 62.5 MCG/ACT IN AEPB
1.0000 | INHALATION_SPRAY | Freq: Every day | RESPIRATORY_TRACT | Status: DC
Start: 2023-10-30 — End: 2023-10-31
  Administered 2023-10-31: 1 via RESPIRATORY_TRACT
  Filled 2023-10-30: qty 7

## 2023-10-30 MED ORDER — NALOXONE HCL 0.4 MG/ML IJ SOLN: 0.4000 mg | INTRAMUSCULAR | Status: DC | PRN

## 2023-10-30 MED ORDER — LEVALBUTEROL HCL 0.63 MG/3ML IN NEBU: 0.6300 mg | INHALATION_SOLUTION | Freq: Four times a day (QID) | RESPIRATORY_TRACT | Status: DC | PRN

## 2023-10-30 MED ORDER — POTASSIUM CHLORIDE CRYS ER 20 MEQ PO TBCR
40.0000 meq | EXTENDED_RELEASE_TABLET | Freq: Once | ORAL | Status: AC
  Administered 2023-10-30: 40 meq via ORAL
  Filled 2023-10-30: qty 2

## 2023-10-30 MED ORDER — ARFORMOTEROL TARTRATE 15 MCG/2ML IN NEBU
15.0000 ug | INHALATION_SOLUTION | Freq: Two times a day (BID) | RESPIRATORY_TRACT | Status: DC
  Filled 2023-10-30 (×3): qty 2

## 2023-10-30 MED ORDER — MAGNESIUM SULFATE 2 GM/50ML IV SOLN
2.0000 g | Freq: Once | INTRAVENOUS | Status: AC
  Administered 2023-10-30: 2 g via INTRAVENOUS
  Filled 2023-10-30: qty 50

## 2023-10-30 MED ORDER — LEVOTHYROXINE SODIUM 25 MCG PO TABS
25.0000 ug | ORAL_TABLET | Freq: Every day | ORAL | Status: DC
  Administered 2023-10-30 (×2): 25 ug via ORAL
  Filled 2023-10-30 (×2): qty 1

## 2023-10-30 MED ORDER — PNEUMOCOCCAL 20-VAL CONJ VACC 0.5 ML IM SUSY
0.5000 mL | PREFILLED_SYRINGE | INTRAMUSCULAR | Status: AC
  Administered 2023-10-31: 0.5 mL via INTRAMUSCULAR
  Filled 2023-10-30: qty 0.5

## 2023-10-30 MED ORDER — BUPROPION HCL ER (XL) 150 MG PO TB24
150.0000 mg | ORAL_TABLET | Freq: Every day | ORAL | Status: DC
  Administered 2023-10-30 (×2): 150 mg via ORAL
  Filled 2023-10-30 (×2): qty 1

## 2023-10-30 MED ORDER — FAMOTIDINE 20 MG PO TABS
20.0000 mg | ORAL_TABLET | Freq: Every day | ORAL | Status: DC
  Administered 2023-10-30 (×2): 20 mg via ORAL
  Filled 2023-10-30 (×2): qty 1

## 2023-10-30 MED ORDER — ATORVASTATIN CALCIUM 40 MG PO TABS
40.0000 mg | ORAL_TABLET | Freq: Every day | ORAL | Status: DC
  Administered 2023-10-30 (×2): 40 mg via ORAL
  Filled 2023-10-30 (×2): qty 1

## 2023-10-30 MED ORDER — ACETAMINOPHEN 325 MG PO TABS
650.0000 mg | ORAL_TABLET | Freq: Four times a day (QID) | ORAL | Status: DC | PRN
  Administered 2023-10-30 – 2023-10-31 (×3): 650 mg via ORAL
  Filled 2023-10-30 (×3): qty 2

## 2023-10-30 NOTE — Assessment & Plan Note (Addendum)
 10/30/23 diagnosed during last admission early this month with + synovial fluid analysis for INTRACELLULAR CALCIUM  PYROPHOSPHATE CRYSTALS. Holding off on steroids for now. Orthopedics may consider intra-articular steroid injection.  10/31/23 10/31/23 had 40 mg intra-articular right knee injection today by ortho. They have ordered 10 days of po abx. Stable for DC.

## 2023-10-30 NOTE — Plan of Care (Signed)

## 2023-10-30 NOTE — Care Management Obs Status (Signed)
 MEDICARE OBSERVATION STATUS NOTIFICATION   Patient Details  Name: Julie Nicholson MRN: [id number] Date of Birth: [birth year]   Medicare Observation Status Notification Given:  Yes    Lyncoln Maskell G., RN 10/30/2023, 9:25 AM

## 2023-10-30 NOTE — Assessment & Plan Note (Addendum)
 10/30/23 replete with oral K. Repeat BMP in AM.  10/31/23 K 4.1 today. Resolved.

## 2023-10-30 NOTE — Evaluation (Signed)
 Clinical/Bedside Swallow Evaluation Patient Details  Name: LAURANN MCMORRIS MRN: [id number] Date of Birth: 10-13-1965  Today's Date: 10/30/2023 Time: SLP Start Time (ACUTE ONLY): 9074 SLP Stop Time (ACUTE ONLY): 0936 SLP Time Calculation (min) (ACUTE ONLY): 11 min  Past Medical History:  Past Medical History:  Diagnosis Date   Abdominal discomfort    Acute sinusitis 11/11/2021   Adenocarcinoma of right lung, stage 4 (HCC) 12/18/2020   Allergic rhinitis 05/08/2021   Antineoplastic chemotherapy induced pancytopenia 02/23/2021   Aortic atherosclerosis 06/24/2023   Cancer (HCC)    Chronic cholecystitis 03/13/2013   Chronic headaches    due to allergies, sinus   Chronic venous hypertension (idiopathic) with other complications of bilateral lower extremity    Constipation 06/23/2023   COPD (chronic obstructive pulmonary disease) (HCC)    per 2012 chest xray   pt states she doesn not have this now (04/10/2013)   COVID-19 virus infection 02/24/2021   Deviated nasal septum    Dyspnea 04/07/2021   Encounter for antineoplastic chemotherapy 12/18/2020   Encounter for antineoplastic immunotherapy 11/17/2022   Eustachian tube dysfunction    GERD (gastroesophageal reflux disease)    occasional uses Tums / Rolaids   Goals of care, counseling/discussion 03/18/2021   Hearing loss    right ear   HFrEF (heart failure with reduced ejection fraction) (HCC) 07/13/2023   High cholesterol    History of radiation therapy    right lung 01/07/2021-02/19/2021  Dr Lynwood Nasuti   History of radiation therapy    Lymph nodes of head,face,neck- 12/23/22-01/07/23- Dr. 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   RUQ pain 03/06/2013   EUS, EGD negative  HIDA scan with ejection fraction shows increased ejection fraction  No gallstones on ultrasound or EUS     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   Tobacco use 12/09/2020   Upper airway cough syndrome 05/08/2021   Past Surgical History:  Past Surgical History:  Procedure Laterality Date   ABDOMINAL HYSTERECTOMY  1995   tx endometriosis, both ovaries removed   ADENOIDECTOMY     APPENDECTOMY  late 1990's   BIOPSY  02/26/2021   Procedure: BIOPSY;  Surgeon: Teressa Toribio SQUIBB, MD;  Location: [facility];  Service: Endoscopy;;   BRONCHIAL BRUSHINGS  12/15/2020   Procedure: BRONCHIAL BRUSHINGS;  Surgeon: Shelah Lamar RAMAN, MD;  Location: [facility] ENDOSCOPY;  Service: Cardiopulmonary;;   BRONCHIAL NEEDLE ASPIRATION BIOPSY  12/15/2020   Procedure: BRONCHIAL NEEDLE ASPIRATION BIOPSIES;  Surgeon: Shelah Lamar RAMAN, MD;  Location: [facility] ENDOSCOPY;  Service: Cardiopulmonary;;   CHOLECYSTECTOMY  04/10/2013   CHOLECYSTECTOMY N/A 04/10/2013   Procedure: LAPAROSCOPIC CHOLECYSTECTOMY WITH INTRAOPERATIVE CHOLANGIOGRAM;  Surgeon: Krystal JINNY Russell, MD;  Location: [facility] OR;  Service: General;  Laterality: N/A;   ELECTROMAGNETIC NAVIGATION BROCHOSCOPY  12/15/2020   Procedure: ELECTROMAGNETIC NAVIGATION BRONCHOSCOPY;  Surgeon: Shelah Lamar RAMAN, MD;  Location: [facility] ENDOSCOPY;  Service: Cardiopulmonary;;   ESOPHAGOGASTRODUODENOSCOPY (EGD) WITH PROPOFOL  N/A 02/26/2021   Procedure: ESOPHAGOGASTRODUODENOSCOPY (EGD) WITH PROPOFOL ;  Surgeon: Teressa Toribio SQUIBB, MD;  Location: [facility]COPY;  Service: Endoscopy;  Laterality: N/A;   EUS N/A  02/16/2013   Procedure: UPPER ENDOSCOPIC ULTRASOUND (EUS) LINEAR;  Surgeon: Toribio SHAUNNA Cedar, MD;  Location: [facility];  Service: Endoscopy;  Laterality: N/A;  radial linear   IR IMAGING GUIDED PORT INSERTION  06/30/2022   KNEE ARTHROSCOPY Right 1980's    cartilage OR   LAPAROSCOPIC ENDOMETRIOSIS FULGURATION  1980's   MYRINGOTOMY WITH TUBE PLACEMENT Right 07/13/2018   Procedure: MYRINGOTOMY WITH TUBE PLACEMENT;  Surgeon: Edda Mt, MD;  Location: [facility];  Service: ENT;  Laterality: Right;   MYRINGOTOMY WITH TUBE PLACEMENT Right 06/04/2021   Procedure: MYRINGOTOMY WITH BUTTERFLY TUBE PLACEMENT;  Surgeon: Edda Mt, MD;  Location: [facility];  Service: ENT;  Laterality: Right;   NASOPHARYNGOSCOPY EUSTATION TUBE BALLOON DILATION Right 07/13/2018   Procedure: NASOPHARYNGOSCOPY EUSTATION TUBE BALLOON DILATION;  Surgeon: Edda Mt, MD;  Location: [facility];  Service: ENT;  Laterality: Right;   TONSILLECTOMY AND ADENOIDECTOMY  ~ 1980   adenoidectomy   TUBAL LIGATION  ~ 1987   TURBINATE REDUCTION Right 07/13/2018   Procedure: OUTFRACTURE TURBINATE;  Surgeon: Edda Mt, MD;  Location: [facility];  Service: ENT;  Laterality: Right;   TYMPANOSTOMY TUBE PLACEMENT     VIDEO BRONCHOSCOPY WITH ENDOBRONCHIAL ULTRASOUND N/A 12/15/2020   Procedure: ROBOTIC VIDEO BRONCHOSCOPY WITH ENDOBRONCHIAL ULTRASOUND;  Surgeon: Shelah Lamar RAMAN, MD;  Location: [facility] ENDOSCOPY;  Service: Cardiopulmonary;  Laterality: N/A;   WRIST SURGERY Left    w/plate   HPI:  BARBI KUMAGAI is a 58 y.o. female with medical history significant of metastatic non-small cell lung cancer currently on chemotherapy, HFmrEF, COPD, depression, anemia, HLD, GERD.  Admitted for recurrence of right knee pain and swellimg. Reports chronic cough related to COPD which is not any worse from her baseline. Denies chest pain. 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    Assessment / Plan / Recommendation  Clinical Impression  Pt seen for  skilled ST evaluation of swallowing abilities. OME was [facility]. Pt reports no h/x of difficulty swallowing any textures when asked but does report having esophagitis and that the PNA she had last month was not linked to aspiration. Pt is currently NPO, the pt states this is due to a possible upcoming surgery for her knee as opposed to a concern for dysphagia- DO confirmed this. This was revealed during the bedside exam- so test was limited to thin liquids only. She had x3 small sips with a straw with no overt s/sx of aspiration or any sign of difficulty. Further trials ceased due to potential upcoming surgery- will attempt further PO trials with solids when appropriate and as time permits. Given her success with thins, it is likely the pt can start a PO diet when removed from NPO status pending what the surgeon has to say. SLP will f/u closely. For now, she remains NPO with sips of thin allowed only if explicitly permitted by her DO or surgery team.  SLP Visit Diagnosis: Dysphagia, unspecified (R13.10)    Aspiration Risk  No limitations    Diet Recommendation NPO except meds    Liquid Administration via: Straw Medication Administration: Whole meds with liquid Supervision: Patient able to self feed    Other  Recommendations       Assistance Recommended at Discharge    Functional Status Assessment Patient has  had a recent decline in their functional status and demonstrates the ability to make significant improvements in function in a reasonable and predictable amount of time.  Frequency and Duration min 1 x/week (Potentially one more visit to complete CSE and administer solids.)          Prognosis Prognosis for improved oropharyngeal function: Good      Swallow Study   General Date of Onset: 10/29/23 HPI: PAISELY BRICK is a 58 y.o. female with medical history significant of metastatic non-small cell lung cancer currently on chemotherapy, HFmrEF, COPD, depression, anemia, HLD, GERD.   Admitted for recurrence of right knee pain and swellimg. Reports chronic cough related to COPD which is not any worse from her baseline. Denies chest pain. 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 Type of Study: Bedside Swallow Evaluation Diet Prior to this Study: NPO;Other (Comment) (Due to possible surgery as oppopsed to dysphagia concerns) Respiratory Status: Room air History of Recent Intubation: No Behavior/Cognition: Alert;Pleasant mood;Cooperative Oral Cavity Assessment: Within Functional Limits Oral Cavity - Dentition: Adequate natural dentition Vision: Functional for self-feeding Self-Feeding Abilities: Able to feed self Patient Positioning: Upright in bed Baseline Vocal Quality: Normal Volitional Cough: Strong Volitional Swallow: Able to elicit    Oral/Motor/Sensory Function Overall Oral Motor/Sensory Function: Within functional limits   Ice Chips Ice chips: Not tested   Thin Liquid Thin Liquid: Within functional limits    Nectar Thick Nectar Thick Liquid: Not tested   Honey Thick Honey Thick Liquid: Not tested   Puree Puree: Not tested   Solid     Solid: Not tested     Manuelita Blew M.S. CCC-SLP

## 2023-10-30 NOTE — Progress Notes (Signed)
 PROGRESS NOTE    Julie Nicholson  FMW:[id number] DOB: Jul 06, 1965 DOA: 10/29/2023 PCP: Cristopher Suzen HERO, NP  Subjective: Pt seen and examined this AM.  On RA. Was sleeping as I entered the room. Only complains of pain after I asked her about her knee.   Hospital Course: CC: right knee pain and swelling HPI: Julie Nicholson is a 58 y.o. female with medical history significant of metastatic non-small cell lung cancer currently on chemotherapy, HFmrEF, COPD, depression, anemia, HLD, GERD.  Recent hospital admission 9/1-10/07/2023 for right lower extremity cellulitis, pseudogout of right knee, and worsening anemia requiring blood transfusion.  Patient presents today for evaluation of recurrence of right knee pain and swelling.  Patient states she was doing well after she left the hospital but since yesterday her knee has become swollen again and painful.  It is difficult for her to walk or bend her knee.  Also knee feels warm to touch.  Denies history of any knee injury since after her hospital discharge.  Denies any pain in her thigh or calf.  She reports being treated for pneumonia sometime last month and since then not having any shortness of breath.  Reports chronic cough related to COPD which is not any worse from her baseline.  Denies chest pain.  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  Denies any episodes of vomiting or abdominal pain.  No other complaints.   ED Course: Slightly tachycardic but remainder of vital signs stable. Labs showing no leukocytosis, hemoglobin 11.5 (stable), potassium 3.3, creatinine 1.2 (baseline 0.8-1.0), AST 52 and remainder of LFTs normal, D-dimer 1.04, lactic acid normal, blood cultures ordered. CT angiogram chest negative for PE. Showing mild to moderate  severely residual heterogeneous airspace opacity within the right lower lobe, decreased in severity in comparison to prior study. Showing trace amount of right pleural fluid and small pericardial effusion. Right lower extremity DVT study negative per preliminary report. Patient was given morphine , Zofran , oxycodone , oral potassium, vancomycin , cefepime , and 500 mL normal saline.     Significant Events: Admitted 10/29/2023 for right knee pain and swelling   Admission Labs: D-dimer 1.04 WBC 5.0, HgB 11.5, plt 185 Na 137, K 3.3, CO2 of 27, BUN 13, scr 1.23, glu 156 T. Prot 7.3, alb 3.0, AST 52, ALT 33, alk phos 126, t. Bili 1.0 Mg 1.4  Admission Imaging Studies: LE U/S negative for DVT CTPA No evidence of pulmonary embolism. 2. 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 3. Mild to moderate severity, residual heterogeneous airspace opacity within the right lower lobe, decreased in severity when compared to the prior study. 4. Trace amount of right pleural fluid. 5. Small pericardial effusion. 6. Moderate severity diffuse circumferential mural thickening along the length of the esophagus, which may represent sequelae associated with esophagitis. 7. Evidence of prior cholecystectomy. 8. Emphysema  Significant Labs:   Significant Imaging Studies:   Antibiotic Therapy: Anti-infectives (From admission, onward)    Start     Dose/Rate Route Frequency Ordered Stop   10/31/23 0500  vancomycin  (VANCOREADY) IVPB 500 mg/100 mL        500 mg 100 mL/hr over 60 Minutes Intravenous Every 24 hours 10/30/23 0106     10/30/23 1000  ceFEPIme  (MAXIPIME ) 2 g in sodium chloride   0.9 % 100 mL IVPB        2 g 200 mL/hr over 30 Minutes Intravenous Every 12 hours 10/30/23 0106     10/30/23 0145  vancomycin  (VANCOCIN ) IVPB 1000 mg/200 mL premix        1,000 mg 200 mL/hr over 60 Minutes Intravenous  Once 10/30/23 0048 10/30/23 0226   10/29/23 2230  vancomycin   (VANCOCIN ) IVPB 1000 mg/200 mL premix  Status:  Discontinued        1,000 mg 200 mL/hr over 60 Minutes Intravenous  Once 10/29/23 2223 10/30/23 0048   10/29/23 2215  ceFEPIme  (MAXIPIME ) 2 g in sodium chloride  0.9 % 100 mL IVPB        2 g 200 mL/hr over 30 Minutes Intravenous  Once 10/29/23 2208 10/29/23 2316   10/29/23 0000  cephALEXin (KEFLEX) 500 MG capsule        500 mg Oral 2 times daily 10/29/23 2155 11/08/23 2359       Procedures: Right knee arthrocentesis  Consultants: ortho    Assessment and Plan: * Effusion of right knee joint 10/30/23 pt with hx of right knee pseudogout diagnosed earlier this AM by synovial fluid analysis.  Pt with recent chemo infusion for her metastatic lung cancer.  Ortho consulted today for right knee arthrocentesis. No apparent indication for I&D/washout today. Appreciate orthopedics expertise. On empiric abx for now.   Hypomagnesemia 10/30/23 give IV Mg today. Repeat Mg level in AM.  Hypokalemia 10/30/23 replete with oral K. Repeat BMP in AM.   Sinus tachycardia 10/30/23 due to pain. Resolved.   Pseudogout of right knee 10/30/23 diagnosed during last admission early this month with + synovial fluid analysis for INTRACELLULAR CALCIUM  PYROPHOSPHATE CRYSTALS. Holding off on steroids for now. Orthopedics may consider intra-articular steroid injection.  Metastatic non-small cell lung cancer (HCC) 10/30/23 followed by outpatient oncology.  Chronic systolic CHF (congestive heart failure) (HCC) 10/30/23 not exacerbated. Stop IVF.   COPD (chronic obstructive pulmonary disease) (HCC) 10/30/23 not exacerbated. On RA  Adenocarcinoma of right lung, stage 4 (HCC) 10/30/23 followed by outpatient oncology. Still undergoing active chemo   DVT prophylaxis: SCDs Start: 10/30/23 0040    Code Status: Full Code Family Communication: no family at bedside. She is decisional Disposition Plan: return home Reason for continuing need for hospitalization:  on IV ABX, awaiting synovial fluid analysis.  Objective: Vitals:   10/29/23 2353 10/30/23 0503 10/30/23 0829 10/30/23 1235  BP: (!) 119/57 113/67 (!) 112/52 121/69  Pulse: (!) 102 98 100 (!) 104  Resp: 18 17 19 18   Temp: 99.5 F (37.5 C) 98.4 F (36.9 C) 98.9 F (37.2 C) 98.5 F (36.9 C)  TempSrc: Oral Oral Oral   SpO2: 98% 95% 95% 95%  Weight: 43.6 kg     Height: 5' 1 (1.549 m)       Intake/Output Summary (Last 24 hours) at 10/30/2023 1306 Last data filed at 10/29/2023 1706 Gross per 24 hour  Intake 506.32 ml  Output --  Net 506.32 ml   Filed Weights   10/29/23 2353  Weight: 43.6 kg    Examination:  Physical Exam Vitals and nursing note reviewed.  Constitutional:      General: She is not in acute distress.    Appearance: She is normal weight. She is not toxic-appearing.  HENT:     Head: Normocephalic and atraumatic.  Cardiovascular:     Rate and Rhythm: Normal rate and regular rhythm.  Pulmonary:     Effort: Pulmonary effort is  normal.     Breath sounds: Normal breath sounds.  Abdominal:     General: Abdomen is flat. Bowel sounds are normal.  Musculoskeletal:     Comments: Right knee effusion. No erythema Mild right ankle edema.  Skin:    General: Skin is warm and dry.     Capillary Refill: Capillary refill takes less than 2 seconds.  Neurological:     Mental Status: She is alert and oriented to person, place, and time.     Data Reviewed: I have personally reviewed following labs and imaging studies  CBC: Recent Labs  Lab 10/26/23 1025 10/29/23 1354  WBC 3.6* 5.0  NEUTROABS 2.2 4.3  HGB 11.6* 11.5*  HCT 34.6* 35.0*  MCV 99.1 101.4*  PLT 203 185   Basic Metabolic Panel: Recent Labs  Lab 10/26/23 1025 10/29/23 1354 10/30/23 0445  NA 141 137 135  K 3.3* 3.3* 3.6  CL 105 98 105  CO2 29 27 24   GLUCOSE 144* 156* 91  BUN 13 13 10   CREATININE 0.96 1.23* 0.74  CALCIUM  9.5 9.8 8.6*  MG  --   --  1.4*   GFR: Estimated Creatinine Clearance:  52.1 mL/min (by C-G formula based on SCr of 0.74 mg/dL). Liver Function Tests: Recent Labs  Lab 10/26/23 1025 10/29/23 1354 10/30/23 0445  AST 23 52* 183*  ALT 14 33 112*  ALKPHOS 127* 126 184*  BILITOT 0.5 1.0 0.9  PROT 7.1 7.3 6.1*  ALBUMIN  3.6 3.0* 2.3*   Sepsis Labs: Recent Labs  Lab 10/29/23 1542  LATICACIDVEN 0.6    Recent Results (from the past 240 hours)  Blood culture (routine x 2)     Status: None (Preliminary result)   Collection Time: 10/29/23 10:09 PM   Specimen: BLOOD RIGHT ARM  Result Value Ref Range Status   Specimen Description BLOOD RIGHT ARM  Final   Special Requests   Final    BOTTLES DRAWN AEROBIC AND ANAEROBIC Blood Culture adequate volume   Culture   Final    NO GROWTH < 12 HOURS Performed at [facility], [address], [place], [place] [address]    Report Status PENDING  Incomplete  Blood culture (routine x 2)     Status: None (Preliminary result)   Collection Time: 10/29/23 10:14 PM   Specimen: BLOOD  Result Value Ref Range Status   Specimen Description BLOOD LEFT ANTECUBITAL  Final   Special Requests   Final    BOTTLES DRAWN AEROBIC AND ANAEROBIC Blood Culture adequate volume   Culture   Final    NO GROWTH < 12 HOURS Performed at [facility], [address], [place], [place] [address]    Report Status PENDING  Incomplete     Radiology Studies: VAS US  LOWER EXTREMITY VENOUS (DVT) (7a-7p) Result Date: 10/30/2023  Lower Venous DVT Study Patient Name:  TAHJANAE BLANKENBURG  Date of Exam:   10/29/2023 Medical Rec #: [id number]         Accession #:    [id number] Date of Birth: 1965-05-17         Patient Gender: F Patient Age:   23 years Exam Location:  [facility] Procedure:      VAS US  LOWER EXTREMITY VENOUS (DVT) Referring Phys: DORN DEC --------------------------------------------------------------------------------  Indications: Pain, Swelling, and Muscle spasms.  Limitations: Edema and pain with compression. Comparison  Study: Prior negative right LEV done 10/01/2023 Performing Technologist: Alberta Lis RVS  Examination Guidelines: A complete evaluation includes  B-mode imaging, spectral Doppler, color Doppler, and power Doppler as needed of all accessible portions of each vessel. Bilateral testing is considered an integral part of a complete examination. Limited examinations for reoccurring indications may be performed as noted. The reflux portion of the exam is performed with the patient in reverse Trendelenburg.  +---------+---------------+---------+-----------+----------+-------------------+ RIGHT    CompressibilityPhasicitySpontaneityPropertiesThrombus Aging      +---------+---------------+---------+-----------+----------+-------------------+ CFV      Full           Yes      Yes                                      +---------+---------------+---------+-----------+----------+-------------------+ SFJ      Full                                                             +---------+---------------+---------+-----------+----------+-------------------+ FV Prox  Full           Yes      Yes                                      +---------+---------------+---------+-----------+----------+-------------------+ FV Mid   Full           Yes      Yes                                      +---------+---------------+---------+-----------+----------+-------------------+ FV DistalFull           Yes      Yes                                      +---------+---------------+---------+-----------+----------+-------------------+ PFV      Full           Yes      Yes                                      +---------+---------------+---------+-----------+----------+-------------------+ POP                     Yes      Yes                  patent by color and                                                       Doppler              +---------+---------------+---------+-----------+----------+-------------------+ PTV      Full                                                             +---------+---------------+---------+-----------+----------+-------------------+  PERO     Full                                                             +---------+---------------+---------+-----------+----------+-------------------+   +----+---------------+---------+-----------+----------+--------------+ LEFTCompressibilityPhasicitySpontaneityPropertiesThrombus Aging +----+---------------+---------+-----------+----------+--------------+ CFV Full           Yes      Yes                                 +----+---------------+---------+-----------+----------+--------------+ SFJ Full           Yes      Yes                                 +----+---------------+---------+-----------+----------+--------------+     Summary: RIGHT: - Findings appear essentially unchanged compared to previous examination. - There is no evidence of deep vein thrombosis in the lower extremity.  - No cystic structure found in the popliteal fossa. - Ultrasound characteristics of enlarged lymph nodes are noted in the groin.  LEFT: - No evidence of common femoral vein obstruction.  - Ultrasound characteristics of enlarged lymph nodes noted in the groin.  *See table(s) above for measurements and observations. Electronically signed by Lonni Gaskins MD on 10/30/2023 at 9:20:25 AM.    Final    CT Angio Chest PE W and/or Wo Contrast Result Date: 10/29/2023 CLINICAL DATA:  Suspected pulmonary embolism. EXAM: CT ANGIOGRAPHY CHEST WITH CONTRAST TECHNIQUE: Multidetector CT imaging of the chest was performed using the standard protocol during bolus administration of intravenous contrast. Multiplanar CT image reconstructions and MIPs were obtained to evaluate the vascular anatomy. RADIATION DOSE REDUCTION: This exam was performed according to the departmental  dose-optimization program which includes automated exposure control, adjustment of the mA and/or kV according to patient size and/or use of iterative reconstruction technique. CONTRAST:  75mL OMNIPAQUE  IOHEXOL  350 MG/ML SOLN COMPARISON:  August 18, 2023 FINDINGS: Cardiovascular: A right-sided venous Port-A-Cath is in place. The thoracic aorta is normal in appearance, without evidence of aortic aneurysm or dissection. Satisfactory opacification of the pulmonary arteries to the segmental level. No evidence of pulmonary embolism. Normal heart size. There is a small pericardial effusion. Mediastinum/Nodes: Stable areas of treated mediastinal and right hilar soft tissue attenuation are seen without evidence of enlarged mediastinal, hilar or axillary lymph nodes. There is moderate severity diffuse circumferential mural thickening along the length of the esophagus. The thyroid  gland and trachea demonstrate no significant findings. Lungs/Pleura: There is extensive emphysematous lung disease with stable diffuse bronchial wall thickening. An area of mild to moderate severity, residual heterogeneous airspace opacity is seen within the right lower lobe. This is decreased in severity when compared to the prior study. There is stable right perihilar and right suprahilar post treatment radiation fibrosis and consolidation. A trace amount of pleural fluid is seen on the right. No pneumothorax is identified. Upper Abdomen: Multiple surgical clips are seen within the gallbladder fossa. Musculoskeletal: No chest wall abnormality. No acute or significant osseous findings. Review of the MIP images confirms the above findings. IMPRESSION: 1. No evidence of pulmonary embolism. 2. 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 3. Mild to moderate severity, residual  heterogeneous airspace opacity within the right lower lobe, decreased in severity when compared to the prior study. 4. Trace  amount of right pleural fluid. 5. Small pericardial effusion. 6. Moderate severity diffuse circumferential mural thickening along the length of the esophagus, which may represent sequelae associated with esophagitis. 7. Evidence of prior cholecystectomy. 8. Emphysema. Electronically Signed   By: Suzen Dials M.D.   On: 10/29/2023 21:10    Scheduled Meds:  arformoterol   15 mcg Nebulization BID   And   umeclidinium bromide   1 puff Inhalation Daily   atorvastatin   40 mg Oral QHS   buPROPion   150 mg Oral QHS   famotidine   20 mg Oral QHS   levothyroxine   25 mcg Oral QHS   [START ON 10/31/2023] pneumococcal 20-valent conjugate vaccine  0.5 mL Intramuscular Tomorrow-1000   potassium chloride   40 mEq Oral Once   sertraline   50 mg Oral Daily   Continuous Infusions:  ceFEPime  (MAXIPIME ) IV 2 g (10/30/23 0814)   magnesium  sulfate bolus IVPB     [START ON 10/31/2023] vancomycin        LOS: 0 days   Time spent: 60 minutes  Camellia Door, DO  [facility]  10/30/2023, 1:06 PM

## 2023-10-30 NOTE — Assessment & Plan Note (Addendum)
 10/30/23 followed by outpatient oncology. Still undergoing active chemo  10/31/23 f/u with outpatient oncology

## 2023-10-30 NOTE — Assessment & Plan Note (Addendum)
 10/30/23 not exacerbated. Stop IVF.   10/31/23 continue prn lasix  at home if needed.

## 2023-10-30 NOTE — Consult Note (Signed)
 Orthopaedic Trauma Service (OTS) Consult   Patient ID: Julie Nicholson MRN: [id number] DOB/AGE: Apr 20, 1965 58 y.o.  Reason for Consult:Right knee effusion r/o septic arthritis Referring Physician: Dr. Camellia Door, DO [facility]  HPI: Julie HACKWORTH is an 58 y.o. female who is being seen in consultation at the request of Dr. Door for evaluation of right knee effusion.  Patient is known to me from a surgery I did on her mother for a femur fracture.  She was recently in the emergency room for knee pain and swelling and had a knee aspiration which showed gout.  It resolved but unfortunately she developed worsening pain in her knee over the last few days and she presented to the emergency room.  She was admitted with concern for sepsis and septic arthritis.  I was consulted this morning for possible aspiration and rule out septic arthritis.  Patient does note feelings of warmth and pain with movement of the knee.  She states that has not really gotten better.  She is anxious to return home to take care of her mother.  Past Medical History:  Diagnosis Date   Abdominal discomfort    Acute sinusitis 11/11/2021   Adenocarcinoma of right lung, stage 4 (HCC) 12/18/2020   Allergic rhinitis 05/08/2021   Antineoplastic chemotherapy induced pancytopenia 02/23/2021   Aortic atherosclerosis 06/24/2023   Cancer (HCC)    Chronic cholecystitis 03/13/2013   Chronic headaches    due to allergies, sinus   Chronic venous hypertension (idiopathic) with other complications of bilateral lower extremity    Constipation 06/23/2023   COPD (chronic obstructive pulmonary disease) (HCC)    per 2012 chest xray   pt states she doesn not have this now (04/10/2013)   COVID-19 virus infection 02/24/2021   Deviated nasal septum    Dyspnea 04/07/2021   Encounter for antineoplastic chemotherapy 12/18/2020   Encounter for antineoplastic immunotherapy 11/17/2022   Eustachian tube dysfunction    GERD (gastroesophageal  reflux disease)    occasional uses Tums / Rolaids   Goals of care, counseling/discussion 03/18/2021   Hearing loss    right ear   HFrEF (heart failure with reduced ejection fraction) (HCC) 07/13/2023   High cholesterol    History of radiation therapy    right lung 01/07/2021-02/19/2021  Dr Lynwood Nasuti   History of radiation therapy    Lymph nodes of head,face,neck- 12/23/22-01/07/23- Dr. 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   RUQ pain 03/06/2013   EUS, EGD negative  HIDA scan with ejection fraction shows increased ejection fraction  No gallstones on ultrasound or EUS     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   Tobacco use 12/09/2020   Upper airway cough syndrome 05/08/2021    Past Surgical History:  Procedure Laterality Date   ABDOMINAL HYSTERECTOMY  1995   tx endometriosis, both ovaries removed   ADENOIDECTOMY     APPENDECTOMY  late 1990's   BIOPSY  02/26/2021   Procedure: BIOPSY;  Surgeon: Teressa Toribio SQUIBB, MD;  Location: [facility] ENDOSCOPY;  Service: Endoscopy;;   BRONCHIAL BRUSHINGS  12/15/2020  Procedure: BRONCHIAL BRUSHINGS;  Surgeon: Shelah Lamar RAMAN, MD;  Location: [facility] ENDOSCOPY;  Service: Cardiopulmonary;;   BRONCHIAL NEEDLE ASPIRATION BIOPSY  12/15/2020   Procedure: BRONCHIAL NEEDLE ASPIRATION BIOPSIES;  Surgeon: Shelah Lamar RAMAN, MD;  Location: [facility] ENDOSCOPY;  Service: Cardiopulmonary;;   CHOLECYSTECTOMY  04/10/2013   CHOLECYSTECTOMY N/A 04/10/2013   Procedure: LAPAROSCOPIC CHOLECYSTECTOMY WITH INTRAOPERATIVE CHOLANGIOGRAM;  Surgeon: Krystal JINNY Russell, MD;  Location:  [facility] OR;  Service: General;  Laterality: N/A;   ELECTROMAGNETIC NAVIGATION BROCHOSCOPY  12/15/2020   Procedure: ELECTROMAGNETIC NAVIGATION BRONCHOSCOPY;  Surgeon: Shelah Lamar RAMAN, MD;  Location: [facility] ENDOSCOPY;  Service: Cardiopulmonary;;   ESOPHAGOGASTRODUODENOSCOPY (EGD) WITH PROPOFOL  N/A 02/26/2021   Procedure: ESOPHAGOGASTRODUODENOSCOPY (EGD) WITH PROPOFOL ;  Surgeon: Teressa Toribio SQUIBB, MD;  Location: [facility] ENDOSCOPY;  Service: Endoscopy;  Laterality: N/A;   EUS N/A 02/16/2013   Procedure: UPPER ENDOSCOPIC ULTRASOUND (EUS) LINEAR;  Surgeon: Toribio SQUIBB Teressa, MD;  Location: [facility] ENDOSCOPY;  Service: Endoscopy;  Laterality: N/A;  radial linear   IR IMAGING GUIDED PORT INSERTION  06/30/2022   KNEE ARTHROSCOPY Right 1980's   cartilage OR   LAPAROSCOPIC ENDOMETRIOSIS FULGURATION  1980's   MYRINGOTOMY WITH TUBE PLACEMENT Right 07/13/2018   Procedure: MYRINGOTOMY WITH TUBE PLACEMENT;  Surgeon: Edda Mt, MD;  Location: [facility] SURGERY CNTR;  Service: ENT;  Laterality: Right;   MYRINGOTOMY WITH TUBE PLACEMENT Right 06/04/2021   Procedure: MYRINGOTOMY WITH BUTTERFLY TUBE PLACEMENT;  Surgeon: Edda Mt, MD;  Location: [facility] SURGERY CNTR;  Service: ENT;  Laterality: Right;   NASOPHARYNGOSCOPY EUSTATION TUBE BALLOON DILATION Right 07/13/2018   Procedure: NASOPHARYNGOSCOPY EUSTATION TUBE BALLOON DILATION;  Surgeon: Edda Mt, MD;  Location: [facility] SURGERY CNTR;  Service: ENT;  Laterality: Right;   TONSILLECTOMY AND ADENOIDECTOMY  ~ 1980   adenoidectomy   TUBAL LIGATION  ~ 1987   TURBINATE REDUCTION Right 07/13/2018   Procedure: OUTFRACTURE TURBINATE;  Surgeon: Edda Mt, MD;  Location: [facility] SURGERY CNTR;  Service: ENT;  Laterality: Right;   TYMPANOSTOMY TUBE PLACEMENT     VIDEO BRONCHOSCOPY WITH ENDOBRONCHIAL ULTRASOUND N/A 12/15/2020   Procedure: ROBOTIC VIDEO BRONCHOSCOPY WITH ENDOBRONCHIAL ULTRASOUND;  Surgeon: Shelah Lamar RAMAN, MD;  Location: [facility] ENDOSCOPY;  Service: Cardiopulmonary;   Laterality: N/A;   WRIST SURGERY Left    w/plate    Family History  Problem Relation Age of Onset   Diabetes Mother    COPD Mother    Rectal cancer Mother    Cervical cancer Mother    Diabetes Father    COPD Maternal Aunt    COPD Maternal Uncle    COPD Maternal Grandfather     Social History:  reports that she quit smoking about 2 years ago. Her smoking use included cigarettes. She started smoking about 43 years ago. She has a 20.5 pack-year smoking history. She has been exposed to tobacco smoke. She has never used smokeless tobacco. She reports that she does not currently use alcohol. She reports that she does not use drugs.  Allergies:  Allergies  Allergen Reactions   Carboplatin  Shortness Of Breath, Itching and Cough    See progress note from 06/16/22    Medications:  No current facility-administered medications on file prior to encounter.   Current Outpatient Medications on File Prior to Encounter  Medication Sig Dispense Refill   acetaminophen  (TYLENOL ) 500 MG tablet Take 500 mg by mouth every 6 (six) hours as needed for moderate pain (pain score 4-6).     albuterol  (VENTOLIN  HFA) 108 (90 Base) MCG/ACT inhaler Inhale 1-2 puffs into the  lungs every 6 (six) hours as needed for wheezing or shortness of breath. 18 g 3   aspirin  EC 81 MG tablet Take 81 mg by mouth at bedtime.     atorvastatin  (LIPITOR) 40 MG tablet Take 40 mg by mouth at bedtime.     azelastine  (ASTELIN ) 0.1 % nasal spray Place 1 spray into both nostrils 2 (two) times daily as needed. Use in each nostril as directed 30 mL 5   benzonatate  (TESSALON ) 200 MG capsule Take 1 capsule (200 mg total) by mouth 3 (three) times daily as needed. 30 capsule 0   Biotin 5000 MCG TABS Take 10,000 mcg by mouth at bedtime.     buPROPion  (WELLBUTRIN  XL) 150 MG 24 hr tablet Take 150 mg by mouth at bedtime.     Cyanocobalamin  (VITAMIN B-12) 5000 MCG TBDP Take 5,000 mcg by mouth at bedtime.     docusate sodium  (COLACE) 100 MG  capsule Take 200 mg by mouth at bedtime.     famotidine  (PEPCID ) 20 MG tablet Take 1 tablet by mouth twice daily (Patient taking differently: Take 20 mg by mouth at bedtime.) 60 tablet 0   fluticasone  (FLONASE ) 50 MCG/ACT nasal spray Place 2 sprays into both nostrils daily. (Patient taking differently: Place 2 sprays into both nostrils daily as needed for allergies.) 16 g 5   folic acid  (FOLVITE ) 1 MG tablet Take 1 tablet by mouth once daily 30 tablet 0   guaiFENesin  (MUCINEX ) 600 MG 12 hr tablet Take 600 mg by mouth at bedtime.     hydrocortisone  2.5 % ointment APPLY OINTMENT EXTERNALLY TWICE DAILY TO THE LOWER LEGS FOR UP TO 2 WEEKS MAXIMUM External; Duration: 30 Days     levothyroxine  (SYNTHROID ) 25 MCG tablet Take 1 tablet (25 mcg total) by mouth daily before breakfast. 30 tablet 2   lidocaine -prilocaine  (EMLA ) cream APPLY TO PORT-A-CATH 30 TO 60 MINUTES BEFORE TREATMENT 30 g 0   linaCLOtide (LINZESS PO) Take 1 tablet by mouth daily.     loratadine  (CLARITIN ) 10 MG tablet Take 10 mg by mouth daily.     Multiple Vitamins-Minerals (HAIR SKIN AND NAILS FORMULA PO) Take 1 tablet by mouth daily.     Omega-3 Fatty Acids ([employer] NATURAL FISH OIL) 1000 MG CAPS Take 2,000 mg by mouth at bedtime.     ondansetron  (ZOFRAN -ODT) 8 MG disintegrating tablet Take 8 mg by mouth every 8 (eight) hours as needed for nausea or vomiting.     Oxycodone  HCl 10 MG TABS Take 10 mg by mouth 5 (five) times daily.     polyethylene glycol powder (MIRALAX ) 17 GM/SCOOP powder Take 17 g by mouth 2 (two) times daily as needed for mild constipation. 255 g 1   rizatriptan (MAXALT-MLT) 10 MG disintegrating tablet Take 10 mg by mouth as needed for migraine. May repeat in 2 hours if needed     sertraline  (ZOLOFT ) 50 MG tablet Take 1 tablet by mouth daily.     Tiotropium Bromide-Olodaterol (STIOLTO RESPIMAT ) 2.5-2.5 MCG/ACT AERS Inhale 2 puffs into the lungs daily. 12 g 3   triamcinolone cream (KENALOG) 0.1 % Apply 1 Application  topically daily as needed (for nausea).     albuterol  (PROVENTIL ) (2.5 MG/3ML) 0.083% nebulizer solution albuterol  sulfate 2.5 mg/3 mL (0.083 %) solution for nebulization  USE 1 VIAL IN NEBULIZER EVERY 6 HOURS AS NEEDED FOR WHEEZING OR SHORTNESS OF BREATH (Patient not taking: Reported on 10/29/2023)     amoxicillin -clavulanate (AUGMENTIN ) 875-125 MG tablet Take 1 tablet by  mouth 2 (two) times daily. (Patient not taking: Reported on 10/29/2023) 14 tablet 0   furosemide  (LASIX ) 20 MG tablet Take 20 mg by mouth as needed for fluid or edema. (Patient not taking: Reported on 10/29/2023)     linezolid  (ZYVOX ) 600 MG tablet Take 1 tablet (600 mg total) by mouth 2 (two) times daily. (Patient not taking: Reported on 10/30/2023) 14 tablet 0   prochlorperazine  (COMPAZINE ) 10 MG tablet Take 1 tablet (10 mg total) by mouth every 6 (six) hours as needed. (Patient not taking: Reported on 10/29/2023) 30 tablet 2     ROS: Constitutional:+fever Vision: No changes in vision ENT: No difficulty swallowing CV: No chest pain Pulm: No SOB or wheezing GI: No nausea or vomiting GU: No urgency or inability to hold urine Skin: No poor wound healing Neurologic: No numbness or tingling Psychiatric: No depression or anxiety Heme: No bruising Allergic: No reaction to medications or food   Exam: Blood pressure (!) 112/52, pulse 100, temperature 98.9 F (37.2 C), temperature source Oral, resp. rate 19, height 5' 1 (1.549 m), weight 43.6 kg, SpO2 95%. General: No acute distress Orientation: Awake alert and oriented x 3 Mood and Affect: Cooperative and pleasant Gait: Unable to assess Coordination and balance: Within normal limits  Right lower extremity: Leg is warm to touch.  The remainder of her body is warm as well.  She does have a moderate-sized effusion to her knee.  She has tenderness throughout the knee and no surrounding erythema.  Compartments are soft compressible.  No fluctuance.  She does not really tolerate any  range of motion of the knee.  She is neurovascularly intact otherwise  Left lower extremity: Skin without lesions. No tenderness to palpation. Full painless ROM, full strength in each muscle groups without evidence of instability.   Medical Decision Making: Data: Imaging: None  Labs:  Results for orders placed or performed during the hospital encounter of 10/29/23 (from the past 24 hours)  Comprehensive metabolic panel     Status: Abnormal   Collection Time: 10/29/23  1:54 PM  Result Value Ref Range   Sodium 137 135 - 145 mmol/L   Potassium 3.3 (L) 3.5 - 5.1 mmol/L   Chloride 98 98 - 111 mmol/L   CO2 27 22 - 32 mmol/L   Glucose, Bld 156 (H) 70 - 99 mg/dL   BUN 13 6 - 20 mg/dL   Creatinine, Ser 8.76 (H) 0.44 - 1.00 mg/dL   Calcium  9.8 8.9 - 10.3 mg/dL   Total Protein 7.3 6.5 - 8.1 g/dL   Albumin  3.0 (L) 3.5 - 5.0 g/dL   AST 52 (H) 15 - 41 U/L   ALT 33 0 - 44 U/L   Alkaline Phosphatase 126 38 - 126 U/L   Total Bilirubin 1.0 0.0 - 1.2 mg/dL   GFR, Estimated 51 (L) >60 mL/min   Anion gap 12 5 - 15  CBC with Differential     Status: Abnormal   Collection Time: 10/29/23  1:54 PM  Result Value Ref Range   WBC 5.0 4.0 - 10.5 K/uL   RBC 3.45 (L) 3.87 - 5.11 MIL/uL   Hemoglobin 11.5 (L) 12.0 - 15.0 g/dL   HCT 64.9 (L) 63.9 - 53.9 %   MCV 101.4 (H) 80.0 - 100.0 fL   MCH 33.3 26.0 - 34.0 pg   MCHC 32.9 30.0 - 36.0 g/dL   RDW 84.8 88.4 - 84.4 %   Platelets 185 150 - 400 K/uL   nRBC  0.0 0.0 - 0.2 %   Neutrophils Relative % 85 %   Neutro Abs 4.3 1.7 - 7.7 K/uL   Lymphocytes Relative 12 %   Lymphs Abs 0.6 (L) 0.7 - 4.0 K/uL   Monocytes Relative 2 %   Monocytes Absolute 0.1 0.1 - 1.0 K/uL   Eosinophils Relative 1 %   Eosinophils Absolute 0.1 0.0 - 0.5 K/uL   Basophils Relative 0 %   Basophils Absolute 0.0 0.0 - 0.1 K/uL   RBC Morphology MORPHOLOGY UNREMARKABLE   D-dimer, quantitative     Status: Abnormal   Collection Time: 10/29/23  1:54 PM  Result Value Ref Range   D-Dimer,  Quant 1.04 (H) 0.00 - 0.50 ug/mL-FEU  I-Stat CG4 Lactic Acid     Status: None   Collection Time: 10/29/23  3:42 PM  Result Value Ref Range   Lactic Acid, Venous 0.6 0.5 - 1.9 mmol/L  Blood culture (routine x 2)     Status: None (Preliminary result)   Collection Time: 10/29/23 10:09 PM   Specimen: BLOOD RIGHT ARM  Result Value Ref Range   Specimen Description BLOOD RIGHT ARM    Special Requests      BOTTLES DRAWN AEROBIC AND ANAEROBIC Blood Culture adequate volume   Culture      NO GROWTH < 12 HOURS Performed at [facility], [address] [place], [place] [address]    Report Status PENDING   Blood culture (routine x 2)     Status: None (Preliminary result)   Collection Time: 10/29/23 10:14 PM   Specimen: BLOOD  Result Value Ref Range   Specimen Description BLOOD LEFT ANTECUBITAL    Special Requests      BOTTLES DRAWN AEROBIC AND ANAEROBIC Blood Culture adequate volume   Culture      NO GROWTH < 12 HOURS Performed at [facility], [address] [place], [place] [address]    Report Status PENDING   Comprehensive metabolic panel     Status: Abnormal   Collection Time: 10/30/23  4:45 AM  Result Value Ref Range   Sodium 135 135 - 145 mmol/L   Potassium 3.6 3.5 - 5.1 mmol/L   Chloride 105 98 - 111 mmol/L   CO2 24 22 - 32 mmol/L   Glucose, Bld 91 70 - 99 mg/dL   BUN 10 6 - 20 mg/dL   Creatinine, Ser 9.25 0.44 - 1.00 mg/dL   Calcium  8.6 (L) 8.9 - 10.3 mg/dL   Total Protein 6.1 (L) 6.5 - 8.1 g/dL   Albumin  2.3 (L) 3.5 - 5.0 g/dL   AST 816 (H) 15 - 41 U/L   ALT 112 (H) 0 - 44 U/L   Alkaline Phosphatase 184 (H) 38 - 126 U/L   Total Bilirubin 0.9 0.0 - 1.2 mg/dL   GFR, Estimated >39 >39 mL/min   Anion gap 6 5 - 15  Magnesium      Status: Abnormal   Collection Time: 10/30/23  4:45 AM  Result Value Ref Range   Magnesium  1.4 (L) 1.7 - 2.4 mg/dL  Sedimentation rate     Status: Abnormal   Collection Time: 10/30/23  4:45 AM  Result Value Ref Range   Sed Rate 100  (H) 0 - 22 mm/hr  C-reactive protein     Status: Abnormal   Collection Time: 10/30/23  9:57 AM  Result Value Ref Range   CRP 9.5 (H) <1.0 mg/dL     Imaging or Labs ordered: Right knee x-rays  Medical history and chart was reviewed and case discussed with medical provider.  Assessment/Plan: 58 year old female with right knee effusion and pain with elevated CRP and ESR.  Patient does have a knee effusion on exam.  I will order x-rays to evaluate the bony structure.  I did discuss with her aspiration to assess whether or not there is an infection process going on.  I do not feel that she requires an I&D today.  Will follow-up with the cell count and cultures.  Will reassess her tomorrow to see how her knee is doing if the cultures are negative and the cell count is low we may plan for a corticosteroid injection.  I have given her a diet.  I will follow-up the results later today and make her n.p.o. after midnight as needed.  Risk benefits to knee aspiration were discussed.  Timeout was performed verbal consent was obtained.  Sterile prepping of the lateral knee was done.  I then placed an 18-gauge needle in the suprapatellar pouch and drew off approximately 20 cc of yellow fluid.  There was no cloudiness and no pus.  I sent this to the lab.  She tolerated well without complication.  Franky MYRTIS Light, MD [facility] [phone] (office) [email or web address]

## 2023-10-30 NOTE — Assessment & Plan Note (Addendum)
 10/30/23 give IV Mg today. Repeat Mg level in AM.  10/31/23 Mg 1.9 today. Resolved.

## 2023-10-30 NOTE — Progress Notes (Signed)
 Pharmacy Antibiotic Note  Julie Nicholson is a 58 y.o. female admitted on 10/29/2023 with right knee pain/swelling.  Pharmacy has been consulted for cefepime /vancomycin  dosing for concerns of septic joint. Previously here beginning of month treated for cellulitis with Augmentin /zyvox  and synovial fluid of R. Knee negative  -WBC WNL, afebrile, sCr 1.23 (bl~0.9-1) -Blood cultures collected -Cefepime  x1  Plan: -Cefepime  2g IV every 12 hours -Vancomycin  1g IV x1 -Vancomycin  500mg  IV every 24 hours (AUC 489, Vd 0.72, TBW, sCr 1.23) -Monitor renal function -Follow up signs of clinical improvement, LOT, de-escalation of antibiotics  -F/u ortho consult for repeat arthrocentesis   Height: 5' 1 (154.9 cm) Weight: 43.6 kg (96 lb 1.9 oz) IBW/kg (Calculated) : 47.8  Temp (24hrs), Avg:98.9 F (37.2 C), Min:98.6 F (37 C), Max:99.5 F (37.5 C)  Recent Labs  Lab 10/26/23 1025 10/29/23 1354 10/29/23 1542  WBC 3.6* 5.0  --   CREATININE 0.96 1.23*  --   LATICACIDVEN  --   --  0.6    Estimated Creatinine Clearance: 33.9 mL/min (A) (by C-G formula based on SCr of 1.23 mg/dL (H)).    Allergies  Allergen Reactions   Carboplatin  Shortness Of Breath, Itching and Cough    See progress note from 06/16/22    Antimicrobials this admission: Cefepime  9/27 >>  Vancomycin  9/28 >>   Microbiology results: 9/27 BCx:  9/2 MRSA PCR: neg  Thank you for allowing pharmacy to be a part of this patient's care.  Lynwood Poplar, PharmD, BCPS Clinical Pharmacist 10/30/2023 1:05 AM

## 2023-10-30 NOTE — Subjective & Objective (Signed)
 Pt seen and examined. Synovial fluid not indicative of septic joint. Ortho has performed intra-articular steroid right knee injection. Pt has pseudogout of right knee as diagnosed on last admission. Ready for DC.

## 2023-10-30 NOTE — Assessment & Plan Note (Addendum)
 10/30/23 pt with hx of right knee pseudogout diagnosed earlier this AM by synovial fluid analysis.  Pt with recent chemo infusion for her metastatic lung cancer.  Ortho consulted today for right knee arthrocentesis. No apparent indication for I&D/washout today. Appreciate orthopedics expertise. On empiric abx for now.  10/31/23 had 40 mg intra-articular right knee injection today by ortho. They have ordered 10 days of po abx. Stable for DC.   Synovial cell count + diff, w/ crystals [[id number]] (Abnormal)   Collected: 10/30/23 1110   Updated: 10/30/23 1305    Color, Synovial YELLOW   Appearance-Synovial HAZY Abnormal    Crystals, Fluid NO CRYSTALS SEEN   WBC, Synovial 8,550 High  /cu mm   Neutrophil, Synovial 83 High  %   Lymphocytes-Synovial Fld 7 %   Monocyte-Macrophage-Synovial Fluid 4 Low  %   Eosinophils-Synovial 6 High

## 2023-10-30 NOTE — Assessment & Plan Note (Signed)
 10/30/23 due to pain. Resolved.

## 2023-10-30 NOTE — Assessment & Plan Note (Addendum)
 10/30/23 not exacerbated. On RA  10/31/23 stable. Continue home inhalers/nebs.

## 2023-10-30 NOTE — Assessment & Plan Note (Addendum)
 10/30/23 followed by outpatient oncology.  10/31/23 f/u with outpatient oncology

## 2023-10-30 NOTE — Hospital Course (Addendum)
 CC: right knee pain and swelling HPI: Julie Nicholson is a 58 y.o. female with medical history significant of metastatic non-small cell lung cancer currently on chemotherapy, HFmrEF, COPD, depression, anemia, HLD, GERD.  Recent hospital admission 9/1-10/07/2023 for right lower extremity cellulitis, pseudogout of right knee, and worsening anemia requiring blood transfusion.  Patient presents today for evaluation of recurrence of right knee pain and swelling.  Patient states she was doing well after she left the hospital but since yesterday her knee has become swollen again and painful.  It is difficult for her to walk or bend her knee.  Also knee feels warm to touch.  Denies history of any knee injury since after her hospital discharge.  Denies any pain in her thigh or calf.  She reports being treated for pneumonia sometime last month and since then not having any shortness of breath.  Reports chronic cough related to COPD which is not any worse from her baseline.  Denies chest pain.  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  Denies any episodes of vomiting or abdominal pain.  No other complaints.   ED Course: Slightly tachycardic but remainder of vital signs stable. Labs showing no leukocytosis, hemoglobin 11.5 (stable), potassium 3.3, creatinine 1.2 (baseline 0.8-1.0), AST 52 and remainder of LFTs normal, D-dimer 1.04, lactic acid normal, blood cultures ordered. CT angiogram chest negative for PE. Showing mild to moderate severely residual heterogeneous airspace opacity within the right lower lobe, decreased in severity in comparison to prior study. Showing trace amount of right pleural fluid and small pericardial effusion. Right lower extremity DVT study negative per preliminary report. Patient was  given morphine , Zofran , oxycodone , oral potassium, vancomycin , cefepime , and 500 mL normal saline.     Significant Events: Admitted 10/29/2023 for right knee pain and swelling   Admission Labs: D-dimer 1.04 WBC 5.0, HgB 11.5, plt 185 Na 137, K 3.3, CO2 of 27, BUN 13, scr 1.23, glu 156 T. Prot 7.3, alb 3.0, AST 52, ALT 33, alk phos 126, t. Bili 1.0 Mg 1.4  Admission Imaging Studies: LE U/S negative for DVT CTPA No evidence of pulmonary embolism. 2. 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 3. Mild to moderate severity, residual heterogeneous airspace opacity within the right lower lobe, decreased in severity when compared to the prior study. 4. Trace amount of right pleural fluid. 5. Small pericardial effusion. 6. Moderate severity diffuse circumferential mural thickening along the length of the esophagus, which may represent sequelae associated with esophagitis. 7. Evidence of prior cholecystectomy. 8. Emphysema  Significant Labs: Synovial fluid analysis Synovial cell count + diff, w/ crystals [[id number]] (Abnormal)   Collected: 10/30/23 1110   Updated: 10/30/23 1305    Color, Synovial YELLOW   Appearance-Synovial HAZY Abnormal    Crystals, Fluid NO CRYSTALS SEEN   WBC, Synovial 8,550 High  /cu mm   Neutrophil, Synovial 83 High  %   Lymphocytes-Synovial Fld 7 %   Monocyte-Macrophage-Synovial Fluid 4 Low  %   Eosinophils-Synovial 6 High      Significant Imaging Studies:   Antibiotic Therapy: Anti-infectives (From admission, onward)    Start     Dose/Rate Route Frequency Ordered Stop   10/31/23 0500  vancomycin  (VANCOREADY) IVPB 500 mg/100 mL  500 mg 100 mL/hr over 60 Minutes Intravenous Every 24 hours 10/30/23 0106     10/30/23 1000  ceFEPIme  (MAXIPIME ) 2 g in sodium chloride  0.9 % 100 mL IVPB        2 g 200 mL/hr over 30 Minutes Intravenous Every 12 hours 10/30/23 0106     10/30/23 0145  vancomycin  (VANCOCIN )  IVPB 1000 mg/200 mL premix        1,000 mg 200 mL/hr over 60 Minutes Intravenous  Once 10/30/23 0048 10/30/23 0226   10/29/23 2230  vancomycin  (VANCOCIN ) IVPB 1000 mg/200 mL premix  Status:  Discontinued        1,000 mg 200 mL/hr over 60 Minutes Intravenous  Once 10/29/23 2223 10/30/23 0048   10/29/23 2215  ceFEPIme  (MAXIPIME ) 2 g in sodium chloride  0.9 % 100 mL IVPB        2 g 200 mL/hr over 30 Minutes Intravenous  Once 10/29/23 2208 10/29/23 2316   10/29/23 0000  cephALEXin (KEFLEX) 500 MG capsule        500 mg Oral 2 times daily 10/29/23 2155 11/08/23 2359       Procedures: Right knee arthrocentesis Right knee intra-articular steroid injection with 40 mg depo-medrol   Consultants: ortho

## 2023-10-31 ENCOUNTER — Encounter: Payer: Self-pay | Admitting: Internal Medicine

## 2023-10-31 ENCOUNTER — Observation Stay (HOSPITAL_COMMUNITY)

## 2023-10-31 ENCOUNTER — Other Ambulatory Visit: Payer: Self-pay

## 2023-10-31 ENCOUNTER — Other Ambulatory Visit: Payer: Self-pay | Admitting: Physician Assistant

## 2023-10-31 ENCOUNTER — Telehealth: Payer: Self-pay | Admitting: Internal Medicine

## 2023-10-31 DIAGNOSIS — C3491 Malignant neoplasm of unspecified part of right bronchus or lung: Secondary | ICD-10-CM | POA: Diagnosis not present

## 2023-10-31 DIAGNOSIS — M11261 Other chondrocalcinosis, right knee: Secondary | ICD-10-CM | POA: Diagnosis not present

## 2023-10-31 DIAGNOSIS — M25461 Effusion, right knee: Secondary | ICD-10-CM | POA: Diagnosis not present

## 2023-10-31 DIAGNOSIS — I3139 Other pericardial effusion (noninflammatory): Secondary | ICD-10-CM

## 2023-10-31 DIAGNOSIS — I5022 Chronic systolic (congestive) heart failure: Secondary | ICD-10-CM

## 2023-10-31 DIAGNOSIS — J449 Chronic obstructive pulmonary disease, unspecified: Secondary | ICD-10-CM

## 2023-10-31 DIAGNOSIS — C3490 Malignant neoplasm of unspecified part of unspecified bronchus or lung: Secondary | ICD-10-CM | POA: Diagnosis not present

## 2023-10-31 DIAGNOSIS — E039 Hypothyroidism, unspecified: Secondary | ICD-10-CM

## 2023-10-31 LAB — BASIC METABOLIC PANEL WITH GFR
Anion gap: 12 (ref 5–15)
BUN: 11 mg/dL (ref 6–20)
CO2: 23 mmol/L (ref 22–32)
Calcium: 9.1 mg/dL (ref 8.9–10.3)
Chloride: 104 mmol/L (ref 98–111)
Creatinine, Ser: 0.84 mg/dL (ref 0.44–1.00)
GFR, Estimated: >60 mL/min (ref >60)
Glucose, Bld: 108 mg/dL — ABNORMAL HIGH (ref 70–99)
Potassium: 4.1 mmol/L (ref 3.5–5.1)
Sodium: 139 mmol/L (ref 135–145)

## 2023-10-31 LAB — CBC WITH DIFFERENTIAL/PLATELET
Abs Immature Granulocytes: 0.01 K/uL (ref 0.00–0.07)
Basophils Absolute: 0 K/uL (ref 0.0–0.1)
Basophils Relative: 0 %
Eosinophils Absolute: 0.1 K/uL (ref 0.0–0.5)
Eosinophils Relative: 2 %
HCT: 27.8 % — ABNORMAL LOW (ref 36.0–46.0)
Hemoglobin: 9.2 g/dL — ABNORMAL LOW (ref 12.0–15.0)
Immature Granulocytes: 0 %
Lymphocytes Relative: 16 %
Lymphs Abs: 0.7 K/uL (ref 0.7–4.0)
MCH: 33 pg (ref 26.0–34.0)
MCHC: 33.1 g/dL (ref 30.0–36.0)
MCV: 99.6 fL (ref 80.0–100.0)
Monocytes Absolute: 0.1 K/uL (ref 0.1–1.0)
Monocytes Relative: 3 %
Neutro Abs: 3.3 K/uL (ref 1.7–7.7)
Neutrophils Relative %: 79 %
Platelets: 161 K/uL (ref 150–400)
RBC: 2.79 MIL/uL — ABNORMAL LOW (ref 3.87–5.11)
RDW: 14.7 % (ref 11.5–15.5)
Smear Review: NORMAL
WBC: 4.3 K/uL (ref 4.0–10.5)
nRBC: 0 % (ref 0.0–0.2)

## 2023-10-31 LAB — ECHOCARDIOGRAM COMPLETE
Area-P 1/2: 5.13 cm2
Height: 61 in
S' Lateral: 3.5 cm
Weight: 1537.93 [oz_av]

## 2023-10-31 LAB — MAGNESIUM: Magnesium: 1.9 mg/dL (ref 1.7–2.4)

## 2023-10-31 MED ORDER — HEPARIN SOD (PORK) LOCK FLUSH 100 UNIT/ML IV SOLN
500.0000 [IU] | INTRAVENOUS | Status: AC | PRN
  Administered 2023-10-31: 500 [IU]

## 2023-10-31 MED ORDER — BUPIVACAINE HCL (PF) 0.5 % IJ SOLN
10.0000 mL | Freq: Once | INTRAMUSCULAR | Status: DC
  Filled 2023-10-31: qty 10

## 2023-10-31 MED ORDER — METHYLPREDNISOLONE ACETATE 40 MG/ML IJ SUSP
40.0000 mg | Freq: Once | INTRAMUSCULAR | Status: DC
  Filled 2023-10-31: qty 1

## 2023-10-31 MED ORDER — DIPHENHYDRAMINE-ZINC ACETATE 2-0.1 % EX CREA
TOPICAL_CREAM | Freq: Three times a day (TID) | CUTANEOUS | Status: DC | PRN
  Filled 2023-10-31: qty 28

## 2023-10-31 MED ORDER — CEFADROXIL 500 MG PO CAPS: 1000.0000 mg | ORAL_CAPSULE | Freq: Two times a day (BID) | ORAL | 0 refills | Status: AC

## 2023-10-31 NOTE — Telephone Encounter (Signed)
 I have called [name] and discussed about her concerns. [facility] do not have albuterol  nebs order renewal. Expired. I tried to call [facility], closed for the night. Has received tiotropium in AM , in ED when she went for back pain. Mild chest congestion.  - to take her albuterol  inhaler 2 puff now and tiotropium once at night if needed.  - to go to ED if not better or worsening.

## 2023-10-31 NOTE — Progress Notes (Signed)
 PROGRESS NOTE    Julie Nicholson  FMW:[id number] DOB: 1965/10/14 DOA: 10/29/2023 PCP: Cristopher Suzen HERO, NP  Subjective: Pt seen and examined. Synovial fluid not indicative of septic joint. Ortho has performed intra-articular steroid right knee injection. Pt has pseudogout of right knee as diagnosed on last admission. Ready for DC.   Hospital Course: CC: right knee pain and swelling HPI: Julie Nicholson is a 58 y.o. female with medical history significant of metastatic non-small cell lung cancer currently on chemotherapy, HFmrEF, COPD, depression, anemia, HLD, GERD.  Recent hospital admission 9/1-10/07/2023 for right lower extremity cellulitis, pseudogout of right knee, and worsening anemia requiring blood transfusion.  Patient presents today for evaluation of recurrence of right knee pain and swelling.  Patient states she was doing well after she left the hospital but since yesterday her knee has become swollen again and painful.  It is difficult for her to walk or bend her knee.  Also knee feels warm to touch.  Denies history of any knee injury since after her hospital discharge.  Denies any pain in her thigh or calf.  She reports being treated for pneumonia sometime last month and since then not having any shortness of breath.  Reports chronic cough related to COPD which is not any worse from her baseline.  Denies chest pain.  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  Denies any episodes of vomiting or abdominal pain.  No other complaints.   ED Course: Slightly tachycardic but remainder of vital signs stable. Labs showing no leukocytosis, hemoglobin 11.5 (stable), potassium 3.3, creatinine 1.2 (baseline 0.8-1.0), AST 52 and remainder of LFTs normal, D-dimer 1.04, lactic acid normal, blood  cultures ordered. CT angiogram chest negative for PE. Showing mild to moderate severely residual heterogeneous airspace opacity within the right lower lobe, decreased in severity in comparison to prior study. Showing trace amount of right pleural fluid and small pericardial effusion. Right lower extremity DVT study negative per preliminary report. Patient was given morphine , Zofran , oxycodone , oral potassium, vancomycin , cefepime , and 500 mL normal saline.     Significant Events: Admitted 10/29/2023 for right knee pain and swelling   Admission Labs: D-dimer 1.04 WBC 5.0, HgB 11.5, plt 185 Na 137, K 3.3, CO2 of 27, BUN 13, scr 1.23, glu 156 T. Prot 7.3, alb 3.0, AST 52, ALT 33, alk phos 126, t. Bili 1.0 Mg 1.4  Admission Imaging Studies: LE U/S negative for DVT CTPA No evidence of pulmonary embolism. 2. 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 3. Mild to moderate severity, residual heterogeneous airspace opacity within the right lower lobe, decreased in severity when compared to the prior study. 4. Trace amount of right pleural fluid. 5. Small pericardial effusion. 6. Moderate severity diffuse circumferential mural thickening along the length of the esophagus, which may represent sequelae associated with esophagitis. 7. Evidence of prior cholecystectomy. 8. Emphysema  Significant Labs:   Significant Imaging Studies:   Antibiotic Therapy: Anti-infectives (From admission, onward)    Start     Dose/Rate Route Frequency Ordered Stop   10/31/23 0500  vancomycin  (VANCOREADY) IVPB 500 mg/100 mL        500 mg 100 mL/hr over 60 Minutes Intravenous Every 24 hours 10/30/23 0106     10/30/23 1000  ceFEPIme  (  MAXIPIME ) 2 g in sodium chloride  0.9 % 100 mL IVPB        2 g 200 mL/hr over 30 Minutes Intravenous Every 12 hours 10/30/23 0106     10/30/23 0145  vancomycin  (VANCOCIN ) IVPB 1000 mg/200 mL premix        1,000 mg 200 mL/hr over 60  Minutes Intravenous  Once 10/30/23 0048 10/30/23 0226   10/29/23 2230  vancomycin  (VANCOCIN ) IVPB 1000 mg/200 mL premix  Status:  Discontinued        1,000 mg 200 mL/hr over 60 Minutes Intravenous  Once 10/29/23 2223 10/30/23 0048   10/29/23 2215  ceFEPIme  (MAXIPIME ) 2 g in sodium chloride  0.9 % 100 mL IVPB        2 g 200 mL/hr over 30 Minutes Intravenous  Once 10/29/23 2208 10/29/23 2316   10/29/23 0000  cephALEXin (KEFLEX) 500 MG capsule        500 mg Oral 2 times daily 10/29/23 2155 11/08/23 2359       Procedures: Right knee arthrocentesis Right knee intra-articular steroid injection with 40 mg depo-medrol   Consultants: ortho    Assessment and Plan: * Effusion of right knee joint 10/30/23 pt with hx of right knee pseudogout diagnosed earlier this AM by synovial fluid analysis.  Pt with recent chemo infusion for her metastatic lung cancer.  Ortho consulted today for right knee arthrocentesis. No apparent indication for I&D/washout today. Appreciate orthopedics expertise. On empiric abx for now.  10/31/23 had 40 mg intra-articular right knee injection today by ortho. They have ordered 10 days of po abx. Stable for DC.   Synovial cell count + diff, w/ crystals [[id number]] (Abnormal)   Collected: 10/30/23 1110   Updated: 10/30/23 1305    Color, Synovial YELLOW   Appearance-Synovial HAZY Abnormal    Crystals, Fluid NO CRYSTALS SEEN   WBC, Synovial 8,550 High  /cu mm   Neutrophil, Synovial 83 High  %   Lymphocytes-Synovial Fld 7 %   Monocyte-Macrophage-Synovial Fluid 4 Low  %   Eosinophils-Synovial 6 High      Pseudogout of right knee 10/30/23 diagnosed during last admission early this month with + synovial fluid analysis for INTRACELLULAR CALCIUM  PYROPHOSPHATE CRYSTALS. Holding off on steroids for now. Orthopedics may consider intra-articular steroid injection.  10/31/23 10/31/23 had 40 mg intra-articular right knee injection today by ortho. They have ordered 10 days of po  abx. Stable for DC.    Metastatic non-small cell lung cancer (HCC) 10/30/23 followed by outpatient oncology.  10/31/23 f/u with outpatient oncology   Hypomagnesemia-resolved as of 10/31/2023 10/30/23 give IV Mg today. Repeat Mg level in AM.  10/31/23 Mg 1.9 today. Resolved.   Hypokalemia-resolved as of 10/31/2023 10/30/23 replete with oral K. Repeat BMP in AM.  10/31/23 K 4.1 today. Resolved.   Sinus tachycardia-resolved as of 10/31/2023 10/30/23 due to pain. Resolved.   Chronic systolic CHF (congestive heart failure) (HCC) 10/30/23 not exacerbated. Stop IVF.   10/31/23 continue prn lasix  at home if needed.   COPD (chronic obstructive pulmonary disease) (HCC) 10/30/23 not exacerbated. On RA  10/31/23 stable. Continue home inhalers/nebs.   Adenocarcinoma of right lung, stage 4 (HCC) 10/30/23 followed by outpatient oncology. Still undergoing active chemo  10/31/23 f/u with outpatient oncology   DVT prophylaxis: SCDs Start: 10/30/23 0040    Code Status: Full Code Family Communication: no family at bedside. She is decisional. Disposition Plan: home Reason for continuing need for hospitalization: stable for DC.  Objective: Vitals:   10/30/23 1709  10/30/23 2053 10/31/23 0540 10/31/23 0850  BP: 108/60 130/65 (!) 110/59 91/61  Pulse: 93 95 (!) 102 100  Resp: 17     Temp: 98.1 F (36.7 C) 98.5 F (36.9 C) 100.2 F (37.9 C) 98 F (36.7 C)  TempSrc:  Oral Oral Oral  SpO2: 99% 99% 98% 98%  Weight:      Height:        Intake/Output Summary (Last 24 hours) at 10/31/2023 1212 Last data filed at 10/30/2023 1711 Gross per 24 hour  Intake 350 ml  Output --  Net 350 ml   Filed Weights   10/29/23 2353  Weight: 43.6 kg    Examination:  Physical Exam Vitals and nursing note reviewed.  Constitutional:      General: She is not in acute distress.    Appearance: She is not toxic-appearing or diaphoretic.  Cardiovascular:     Rate and Rhythm: Normal rate and  regular rhythm.  Pulmonary:     Effort: Pulmonary effort is normal.     Breath sounds: Normal breath sounds.  Abdominal:     General: Abdomen is flat. Bowel sounds are normal. There is no distension.     Palpations: Abdomen is soft.  Musculoskeletal:     Comments: No right knee effusion anymore.  Skin:    General: Skin is warm and dry.     Capillary Refill: Capillary refill takes less than 2 seconds.  Neurological:     General: No focal deficit present.     Mental Status: She is alert and oriented to person, place, and time.     Data Reviewed: I have personally reviewed following labs and imaging studies  CBC: Recent Labs  Lab 10/26/23 1025 10/29/23 1354 10/31/23 0150  WBC 3.6* 5.0 4.3  NEUTROABS 2.2 4.3 3.3  HGB 11.6* 11.5* 9.2*  HCT 34.6* 35.0* 27.8*  MCV 99.1 101.4* 99.6  PLT 203 185 161   Basic Metabolic Panel: Recent Labs  Lab 10/26/23 1025 10/29/23 1354 10/30/23 0445 10/31/23 0150  NA 141 137 135 139  K 3.3* 3.3* 3.6 4.1  CL 105 98 105 104  CO2 29 27 24 23   GLUCOSE 144* 156* 91 108*  BUN 13 13 10 11   CREATININE 0.96 1.23* 0.74 0.84  CALCIUM  9.5 9.8 8.6* 9.1  MG  --   --  1.4* 1.9   GFR: Estimated Creatinine Clearance: 49.6 mL/min (by C-G formula based on SCr of 0.84 mg/dL). Liver Function Tests: Recent Labs  Lab 10/26/23 1025 10/29/23 1354 10/30/23 0445  AST 23 52* 183*  ALT 14 33 112*  ALKPHOS 127* 126 184*  BILITOT 0.5 1.0 0.9  PROT 7.1 7.3 6.1*  ALBUMIN  3.6 3.0* 2.3*    Sepsis Labs: Recent Labs  Lab 10/29/23 1542  LATICACIDVEN 0.6    Recent Results (from the past 240 hours)  Blood culture (routine x 2)     Status: None (Preliminary result)   Collection Time: 10/29/23 10:09 PM   Specimen: BLOOD RIGHT ARM  Result Value Ref Range Status   Specimen Description BLOOD RIGHT ARM  Final   Special Requests   Final    BOTTLES DRAWN AEROBIC AND ANAEROBIC Blood Culture adequate volume   Culture   Final    NO GROWTH 2 DAYS Performed at  [facility], [address], [place], [place] [address]    Report Status PENDING  Incomplete  Blood culture (routine x 2)     Status: None (Preliminary result)  Collection Time: 10/29/23 10:14 PM   Specimen: BLOOD  Result Value Ref Range Status   Specimen Description BLOOD LEFT ANTECUBITAL  Final   Special Requests   Final    BOTTLES DRAWN AEROBIC AND ANAEROBIC Blood Culture adequate volume   Culture   Final    NO GROWTH 2 DAYS Performed at [facility], [address], [place], [place] [address]    Report Status PENDING  Incomplete  Aerobic/Anaerobic Culture w Gram Stain (surgical/deep wound)     Status: None (Preliminary result)   Collection Time: 10/30/23 11:09 AM   Specimen: Synovium; Synovial Fluid  Result Value Ref Range Status   Specimen Description SYNOVIAL  Final   Special Requests KNEE  Final   Gram Stain   Final    RARE WBC PRESENT, PREDOMINANTLY PMN NO ORGANISMS SEEN    Culture   Final    NO GROWTH < 24 HOURS Performed at [facility], [address], [place], [place] [address]    Report Status PENDING  Incomplete     Radiology Studies: DG Knee Right Port Result Date: 10/30/2023 CLINICAL DATA:  Right knee pain. EXAM: PORTABLE RIGHT KNEE - 1-2 VIEW COMPARISON:  Tibia/fibular radiographs 10/03/2023 FINDINGS: The alignment and joint spaces are normal. Medial and lateral tibiofemoral chondrocalcinosis. No fracture, erosion, or bony destructive change. Trace peripheral spurring in the tibiofemoral compartments. Generalized soft tissue edema. Small joint effusion. IMPRESSION: 1. Generalized soft tissue edema and small joint effusion. No acute osseous abnormality. 2. Chondrocalcinosis. Electronically Signed   By: Andrea Gasman M.D.   On: 10/30/2023 14:12   VAS US  LOWER EXTREMITY VENOUS (DVT) (7a-7p) Result Date: 10/30/2023  Lower Venous DVT Study Patient Name:  Julie Nicholson  Date of Exam:   10/29/2023 Medical Rec #: [id number]         Accession #:     [id number] Date of Birth: 1965-10-24         Patient Gender: F Patient Age:   30 years Exam Location:  [facility] Procedure:      VAS US  LOWER EXTREMITY VENOUS (DVT) Referring Phys: DORN DEC --------------------------------------------------------------------------------  Indications: Pain, Swelling, and Muscle spasms.  Limitations: Edema and pain with compression. Comparison Study: Prior negative right LEV done 10/01/2023 Performing Technologist: Alberta Lis RVS  Examination Guidelines: A complete evaluation includes B-mode imaging, spectral Doppler, color Doppler, and power Doppler as needed of all accessible portions of each vessel. Bilateral testing is considered an integral part of a complete examination. Limited examinations for reoccurring indications may be performed as noted. The reflux portion of the exam is performed with the patient in reverse Trendelenburg.  +---------+---------------+---------+-----------+----------+-------------------+ RIGHT    CompressibilityPhasicitySpontaneityPropertiesThrombus Aging      +---------+---------------+---------+-----------+----------+-------------------+ CFV      Full           Yes      Yes                                      +---------+---------------+---------+-----------+----------+-------------------+ SFJ      Full                                                             +---------+---------------+---------+-----------+----------+-------------------+ FV  Prox  Full           Yes      Yes                                      +---------+---------------+---------+-----------+----------+-------------------+ FV Mid   Full           Yes      Yes                                      +---------+---------------+---------+-----------+----------+-------------------+ FV DistalFull           Yes      Yes                                       +---------+---------------+---------+-----------+----------+-------------------+ PFV      Full           Yes      Yes                                      +---------+---------------+---------+-----------+----------+-------------------+ POP                     Yes      Yes                  patent by color and                                                       Doppler             +---------+---------------+---------+-----------+----------+-------------------+ PTV      Full                                                             +---------+---------------+---------+-----------+----------+-------------------+ PERO     Full                                                             +---------+---------------+---------+-----------+----------+-------------------+   +----+---------------+---------+-----------+----------+--------------+ LEFTCompressibilityPhasicitySpontaneityPropertiesThrombus Aging +----+---------------+---------+-----------+----------+--------------+ CFV Full           Yes      Yes                                 +----+---------------+---------+-----------+----------+--------------+ SFJ Full           Yes      Yes                                 +----+---------------+---------+-----------+----------+--------------+  Summary: RIGHT: - Findings appear essentially unchanged compared to previous examination. - There is no evidence of deep vein thrombosis in the lower extremity.  - No cystic structure found in the popliteal fossa. - Ultrasound characteristics of enlarged lymph nodes are noted in the groin.  LEFT: - No evidence of common femoral vein obstruction.  - Ultrasound characteristics of enlarged lymph nodes noted in the groin.  *See table(s) above for measurements and observations. Electronically signed by Lonni Gaskins MD on 10/30/2023 at 9:20:25 AM.    Final    CT Angio Chest PE W and/or Wo Contrast Result Date:  10/29/2023 CLINICAL DATA:  Suspected pulmonary embolism. EXAM: CT ANGIOGRAPHY CHEST WITH CONTRAST TECHNIQUE: Multidetector CT imaging of the chest was performed using the standard protocol during bolus administration of intravenous contrast. Multiplanar CT image reconstructions and MIPs were obtained to evaluate the vascular anatomy. RADIATION DOSE REDUCTION: This exam was performed according to the departmental dose-optimization program which includes automated exposure control, adjustment of the mA and/or kV according to patient size and/or use of iterative reconstruction technique. CONTRAST:  75mL OMNIPAQUE  IOHEXOL  350 MG/ML SOLN COMPARISON:  August 18, 2023 FINDINGS: Cardiovascular: A right-sided venous Port-A-Cath is in place. The thoracic aorta is normal in appearance, without evidence of aortic aneurysm or dissection. Satisfactory opacification of the pulmonary arteries to the segmental level. No evidence of pulmonary embolism. Normal heart size. There is a small pericardial effusion. Mediastinum/Nodes: Stable areas of treated mediastinal and right hilar soft tissue attenuation are seen without evidence of enlarged mediastinal, hilar or axillary lymph nodes. There is moderate severity diffuse circumferential mural thickening along the length of the esophagus. The thyroid  gland and trachea demonstrate no significant findings. Lungs/Pleura: There is extensive emphysematous lung disease with stable diffuse bronchial wall thickening. An area of mild to moderate severity, residual heterogeneous airspace opacity is seen within the right lower lobe. This is decreased in severity when compared to the prior study. There is stable right perihilar and right suprahilar post treatment radiation fibrosis and consolidation. A trace amount of pleural fluid is seen on the right. No pneumothorax is identified. Upper Abdomen: Multiple surgical clips are seen within the gallbladder fossa. Musculoskeletal: No chest wall  abnormality. No acute or significant osseous findings. Review of the MIP images confirms the above findings. IMPRESSION: 1. No evidence of pulmonary embolism. 2. 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 3. Mild to moderate severity, residual heterogeneous airspace opacity within the right lower lobe, decreased in severity when compared to the prior study. 4. Trace amount of right pleural fluid. 5. Small pericardial effusion. 6. Moderate severity diffuse circumferential mural thickening along the length of the esophagus, which may represent sequelae associated with esophagitis. 7. Evidence of prior cholecystectomy. 8. Emphysema. Electronically Signed   By: Suzen Dials M.D.   On: 10/29/2023 21:10    Scheduled Meds:  arformoterol   15 mcg Nebulization BID   And   umeclidinium bromide   1 puff Inhalation Daily   atorvastatin   40 mg Oral QHS   bupivacaine (PF)  10 mL Infiltration Once   buPROPion   150 mg Oral QHS   famotidine   20 mg Oral QHS   levothyroxine   25 mcg Oral QHS   methylPREDNISolone  acetate  40 mg Intra-articular Once   sertraline   50 mg Oral Daily   Continuous Infusions:   LOS: 0 days   Time spent: 55 minutes  Camellia Door, DO  [facility]  10/31/2023, 12:12 PM

## 2023-10-31 NOTE — Discharge Summary (Addendum)
 [facility] Discharge Summary   Patient name: Julie Nicholson  Admit date:     10/29/2023  Discharge date: 10/31/2023  Attending Physician: RATHORE, VASUNDHRA [[id number]]  Discharge Physician: Camellia Door   PCP: Cristopher Suzen HERO, NP  Admitted From: Home Disposition:  Home  Recommendations for Outpatient Follow-up:  Follow up with PCP in 1-2 weeks Follow up with oncology Please follow up on the following pending results: blood cx, synovial fluid cultures  Home Health:No Equipment/Devices: None    Discharge Condition:Stable CODE STATUS:FULL Diet recommendation: Regular Fluid Restriction: None  Hospital Summary: CC: right knee pain and swelling HPI: Julie Nicholson is a 58 y.o. female with medical history significant of metastatic non-small cell lung cancer currently on chemotherapy, HFmrEF, COPD, depression, anemia, HLD, GERD.  Recent hospital admission 9/1-10/07/2023 for right lower extremity cellulitis, pseudogout of right knee, and worsening anemia requiring blood transfusion.  Patient presents today for evaluation of recurrence of right knee pain and swelling.  Patient states she was doing well after she left the hospital but since yesterday her knee has become swollen again and painful.  It is difficult for her to walk or bend her knee.  Also knee feels warm to touch.  Denies history of any knee injury since after her hospital discharge.  Denies any pain in her thigh or calf.  She reports being treated for pneumonia sometime last month and since then not having any shortness of breath.  Reports chronic cough related to COPD which is not any worse from her baseline.  Denies chest pain.  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  Denies any episodes of vomiting  or abdominal pain.  No other complaints.   ED Course: Slightly tachycardic but remainder of vital signs stable. Labs showing no leukocytosis, hemoglobin 11.5 (stable), potassium 3.3, creatinine 1.2 (baseline 0.8-1.0), AST 52 and remainder of LFTs normal, D-dimer 1.04, lactic acid normal, blood cultures ordered. CT angiogram chest negative for PE. Showing mild to moderate severely residual heterogeneous airspace opacity within the right lower lobe, decreased in severity in comparison to prior study. Showing trace amount of right pleural fluid and small pericardial effusion. Right lower extremity DVT study negative per preliminary report. Patient was given morphine , Zofran , oxycodone , oral potassium, vancomycin , cefepime , and 500 mL normal saline.     Significant Events: Admitted 10/29/2023 for right knee pain and swelling   Admission Labs: D-dimer 1.04 WBC 5.0, HgB 11.5, plt 185 Na 137, K 3.3, CO2 of 27, BUN 13, scr 1.23, glu 156 T. Prot 7.3, alb 3.0, AST 52, ALT 33, alk phos 126, t. Bili 1.0 Mg 1.4  Admission Imaging Studies: LE U/S negative for DVT CTPA No evidence of pulmonary embolism. 2. 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 3. Mild to moderate severity, residual heterogeneous airspace opacity within the right lower lobe, decreased in severity when compared to the prior study. 4. Trace amount of right pleural fluid. 5. Small pericardial effusion. 6. Moderate severity diffuse circumferential mural thickening along the length of the esophagus, which may represent sequelae associated with esophagitis. 7. Evidence of prior cholecystectomy. 8. Emphysema  Significant Labs: Synovial fluid analysis Synovial cell count + diff, w/ crystals [[id number]] (Abnormal)   Collected: 10/30/23 1110   Updated: 10/30/23 1305  Color, Synovial YELLOW   Appearance-Synovial HAZY Abnormal    Crystals, Fluid NO CRYSTALS SEEN   WBC, Synovial 8,550 High   /cu mm   Neutrophil, Synovial 83 High  %   Lymphocytes-Synovial Fld 7 %   Monocyte-Macrophage-Synovial Fluid 4 Low  %   Eosinophils-Synovial 6 High      Significant Imaging Studies:   Antibiotic Therapy: Anti-infectives (From admission, onward)    Start     Dose/Rate Route Frequency Ordered Stop   10/31/23 0500  vancomycin  (VANCOREADY) IVPB 500 mg/100 mL        500 mg 100 mL/hr over 60 Minutes Intravenous Every 24 hours 10/30/23 0106     10/30/23 1000  ceFEPIme  (MAXIPIME ) 2 g in sodium chloride  0.9 % 100 mL IVPB        2 g 200 mL/hr over 30 Minutes Intravenous Every 12 hours 10/30/23 0106     10/30/23 0145  vancomycin  (VANCOCIN ) IVPB 1000 mg/200 mL premix        1,000 mg 200 mL/hr over 60 Minutes Intravenous  Once 10/30/23 0048 10/30/23 0226   10/29/23 2230  vancomycin  (VANCOCIN ) IVPB 1000 mg/200 mL premix  Status:  Discontinued        1,000 mg 200 mL/hr over 60 Minutes Intravenous  Once 10/29/23 2223 10/30/23 0048   10/29/23 2215  ceFEPIme  (MAXIPIME ) 2 g in sodium chloride  0.9 % 100 mL IVPB        2 g 200 mL/hr over 30 Minutes Intravenous  Once 10/29/23 2208 10/29/23 2316   10/29/23 0000  cephALEXin (KEFLEX) 500 MG capsule        500 mg Oral 2 times daily 10/29/23 2155 11/08/23 2359       Procedures: Right knee arthrocentesis Right knee intra-articular steroid injection with 40 mg depo-medrol   Consultants: ortho   Hospital Course by Problem: * Effusion of right knee joint 10/30/23 pt with hx of right knee pseudogout diagnosed earlier this AM by synovial fluid analysis.  Pt with recent chemo infusion for her metastatic lung cancer.  Ortho consulted today for right knee arthrocentesis. No apparent indication for I&D/washout today. Appreciate orthopedics expertise. On empiric abx for now.  10/31/23 had 40 mg intra-articular right knee injection today by ortho. They have ordered 10 days of po abx. Stable for DC.   Synovial cell count + diff, w/ crystals [[id number]]  (Abnormal)   Collected: 10/30/23 1110   Updated: 10/30/23 1305    Color, Synovial YELLOW   Appearance-Synovial HAZY Abnormal    Crystals, Fluid NO CRYSTALS SEEN   WBC, Synovial 8,550 High  /cu mm   Neutrophil, Synovial 83 High  %   Lymphocytes-Synovial Fld 7 %   Monocyte-Macrophage-Synovial Fluid 4 Low  %   Eosinophils-Synovial 6 High      Pseudogout of right knee 10/30/23 diagnosed during last admission early this month with + synovial fluid analysis for INTRACELLULAR CALCIUM  PYROPHOSPHATE CRYSTALS. Holding off on steroids for now. Orthopedics may consider intra-articular steroid injection.  10/31/23 10/31/23 had 40 mg intra-articular right knee injection today by ortho. They have ordered 10 days of po abx. Stable for DC.    Metastatic non-small cell lung cancer (HCC) 10/30/23 followed by outpatient oncology.  10/31/23 f/u with outpatient oncology   Hypomagnesemia-resolved as of 10/31/2023 10/30/23 give IV Mg today. Repeat Mg level in AM.  10/31/23 Mg 1.9 today. Resolved.   Hypokalemia-resolved as of 10/31/2023 10/30/23 replete with oral K. Repeat BMP in AM.  10/31/23 K 4.1 today. Resolved.  Sinus tachycardia-resolved as of 10/31/2023 10/30/23 due to pain. Resolved.   Chronic systolic CHF (congestive heart failure) (HCC) 10/30/23 not exacerbated. Stop IVF.   10/31/23 continue prn lasix  at home if needed.   COPD (chronic obstructive pulmonary disease) (HCC) 10/30/23 not exacerbated. On RA  10/31/23 stable. Continue home inhalers/nebs.   Adenocarcinoma of right lung, stage 4 (HCC) 10/30/23 followed by outpatient oncology. Still undergoing active chemo  10/31/23 f/u with outpatient oncology     Discharge Diagnoses:  Principal Problem:   Effusion of right knee joint Active Problems:   Metastatic non-small cell lung cancer (HCC)   Pseudogout of right knee   Adenocarcinoma of right lung, stage 4 (HCC)   COPD (chronic obstructive pulmonary disease) (HCC)    Chronic systolic CHF (congestive heart failure) (HCC)   Discharge Instructions  Discharge Instructions     Call MD for:  difficulty breathing, headache or visual disturbances   Complete by: As directed    Call MD for:  extreme fatigue   Complete by: As directed    Call MD for:  hives   Complete by: As directed    Call MD for:  persistant dizziness or light-headedness   Complete by: As directed    Call MD for:  persistant nausea and vomiting   Complete by: As directed    Call MD for:  redness, tenderness, or signs of infection (pain, swelling, redness, odor or green/yellow discharge around incision site)   Complete by: As directed    Call MD for:  severe uncontrolled pain   Complete by: As directed    Call MD for:  temperature >100.4   Complete by: As directed    Diet general   Complete by: As directed    Discharge instructions   Complete by: As directed    1. Follow up with your primary care provider in 1-2 weeks following discharge from hospital. 2. Follow up with oncology as scheduled   Increase activity slowly   Complete by: As directed       Allergies as of 10/31/2023       Reactions   Carboplatin  Shortness Of Breath, Itching, Cough   See progress note from 06/16/22        Medication List     STOP taking these medications    amoxicillin -clavulanate 875-125 MG tablet Commonly known as: AUGMENTIN    linezolid  600 MG tablet Commonly known as: ZYVOX        TAKE these medications    acetaminophen  500 MG tablet Commonly known as: TYLENOL  Take 500 mg by mouth every 6 (six) hours as needed for moderate pain (pain score 4-6).   albuterol  (2.5 MG/3ML) 0.083% nebulizer solution Commonly known as: PROVENTIL  albuterol  sulfate 2.5 mg/3 mL (0.083 %) solution for nebulization  USE 1 VIAL IN NEBULIZER EVERY 6 HOURS AS NEEDED FOR WHEEZING OR SHORTNESS OF BREATH   albuterol  108 (90 Base) MCG/ACT inhaler Commonly known as: VENTOLIN  HFA Inhale 1-2 puffs into the  lungs every 6 (six) hours as needed for wheezing or shortness of breath.   aspirin  EC 81 MG tablet Take 81 mg by mouth at bedtime.   atorvastatin  40 MG tablet Commonly known as: LIPITOR Take 40 mg by mouth at bedtime.   azelastine  0.1 % nasal spray Commonly known as: ASTELIN  Place 1 spray into both nostrils 2 (two) times daily as needed. Use in each nostril as directed   benzonatate  200 MG capsule Commonly known as: TESSALON  Take 1 capsule (200 mg total) by mouth  3 (three) times daily as needed.   Biotin 5000 MCG Tabs Take 10,000 mcg by mouth at bedtime.   buPROPion  150 MG 24 hr tablet Commonly known as: WELLBUTRIN  XL Take 150 mg by mouth at bedtime.   cefadroxil 500 MG capsule Commonly known as: DURICEF Take 2 capsules (1,000 mg total) by mouth 2 (two) times daily for 7 days.   [employer] Natural Fish Oil 1000 MG Caps Take 2,000 mg by mouth at bedtime.   docusate sodium  100 MG capsule Commonly known as: COLACE Take 200 mg by mouth at bedtime.   famotidine  20 MG tablet Commonly known as: PEPCID  Take 1 tablet by mouth twice daily What changed: when to take this   fluticasone  50 MCG/ACT nasal spray Commonly known as: FLONASE  Place 2 sprays into both nostrils daily. What changed:  when to take this reasons to take this   folic acid  1 MG tablet Commonly known as: FOLVITE  Take 1 tablet by mouth once daily   furosemide  20 MG tablet Commonly known as: LASIX  Take 20 mg by mouth as needed for fluid or edema.   guaiFENesin  600 MG 12 hr tablet Commonly known as: MUCINEX  Take 600 mg by mouth at bedtime.   HAIR SKIN AND NAILS FORMULA PO Take 1 tablet by mouth daily.   hydrocortisone  2.5 % ointment APPLY OINTMENT EXTERNALLY TWICE DAILY TO THE LOWER LEGS FOR UP TO 2 WEEKS MAXIMUM External; Duration: 30 Days   levothyroxine  25 MCG tablet Commonly known as: Synthroid  Take 1 tablet (25 mcg total) by mouth daily before breakfast.   lidocaine -prilocaine  cream Commonly known  as: EMLA  APPLY TO PORT-A-CATH 30 TO 60 MINUTES BEFORE TREATMENT   LINZESS PO Take 1 tablet by mouth daily.   loratadine  10 MG tablet Commonly known as: CLARITIN  Take 10 mg by mouth daily.   ondansetron  8 MG disintegrating tablet Commonly known as: ZOFRAN -ODT Take 8 mg by mouth every 8 (eight) hours as needed for nausea or vomiting.   Oxycodone  HCl 10 MG Tabs Take 10 mg by mouth 5 (five) times daily.   polyethylene glycol powder 17 GM/SCOOP powder Commonly known as: MiraLax  Take 17 g by mouth 2 (two) times daily as needed for mild constipation.   prochlorperazine  10 MG tablet Commonly known as: COMPAZINE  Take 1 tablet (10 mg total) by mouth every 6 (six) hours as needed.   rizatriptan 10 MG disintegrating tablet Commonly known as: MAXALT-MLT Take 10 mg by mouth as needed for migraine. May repeat in 2 hours if needed   sertraline  50 MG tablet Commonly known as: ZOLOFT  Take 1 tablet by mouth daily.   Stiolto Respimat  2.5-2.5 MCG/ACT Aers Generic drug: Tiotropium Bromide-Olodaterol Inhale 2 puffs into the lungs daily.   triamcinolone cream 0.1 % Commonly known as: KENALOG Apply 1 Application topically daily as needed (for nausea).   Vitamin B-12 5000 MCG Tbdp Take 5,000 mcg by mouth at bedtime.        Allergies  Allergen Reactions   Carboplatin  Shortness Of Breath, Itching and Cough    See progress note from 06/16/22    Discharge Exam: Vitals:   10/31/23 0540 10/31/23 0850  BP: (!) 110/59 91/61  Pulse: (!) 102 100  Resp:    Temp: 100.2 F (37.9 C) 98 F (36.7 C)  SpO2: 98% 98%    Physical Exam Vitals and nursing note reviewed.  Constitutional:      General: She is not in acute distress.    Appearance: She is not toxic-appearing or diaphoretic.  Cardiovascular:     Rate and Rhythm: Normal rate and regular rhythm.  Pulmonary:     Effort: Pulmonary effort is normal.     Breath sounds: Normal breath sounds.  Abdominal:     General: Abdomen is flat.  Bowel sounds are normal. There is no distension.     Palpations: Abdomen is soft.  Musculoskeletal:     Comments: No right knee effusion anymore.  Skin:    General: Skin is warm and dry.     Capillary Refill: Capillary refill takes less than 2 seconds.  Neurological:     General: No focal deficit present.     Mental Status: She is alert and oriented to person, place, and time.     The results of significant diagnostics from this hospitalization (including imaging, microbiology, ancillary and laboratory) are listed below for reference.    Microbiology: Recent Results (from the past 240 hours)  Blood culture (routine x 2)     Status: None (Preliminary result)   Collection Time: 10/29/23 10:09 PM   Specimen: BLOOD RIGHT ARM  Result Value Ref Range Status   Specimen Description BLOOD RIGHT ARM  Final   Special Requests   Final    BOTTLES DRAWN AEROBIC AND ANAEROBIC Blood Culture adequate volume   Culture   Final    NO GROWTH 2 DAYS Performed at [facility], [address], [place], [place] [address]    Report Status PENDING  Incomplete  Blood culture (routine x 2)     Status: None (Preliminary result)   Collection Time: 10/29/23 10:14 PM   Specimen: BLOOD  Result Value Ref Range Status   Specimen Description BLOOD LEFT ANTECUBITAL  Final   Special Requests   Final    BOTTLES DRAWN AEROBIC AND ANAEROBIC Blood Culture adequate volume   Culture   Final    NO GROWTH 2 DAYS Performed at [facility], [address] [place], [place] [address]    Report Status PENDING  Incomplete  Aerobic/Anaerobic Culture w Gram Stain (surgical/deep wound)     Status: None (Preliminary result)   Collection Time: 10/30/23 11:09 AM   Specimen: Synovium; Synovial Fluid  Result Value Ref Range Status   Specimen Description SYNOVIAL  Final   Special Requests KNEE  Final   Gram Stain   Final    RARE WBC PRESENT, PREDOMINANTLY PMN NO ORGANISMS SEEN    Culture   Final    NO GROWTH < 24  HOURS Performed at [facility], [address] [place], [place] [address]    Report Status PENDING  Incomplete     Labs: BNP (last 3 results) No results for input(s): BNP in the last 8760 hours. Basic Metabolic Panel: Recent Labs  Lab 10/26/23 1025 10/29/23 1354 10/30/23 0445 10/31/23 0150  NA 141 137 135 139  K 3.3* 3.3* 3.6 4.1  CL 105 98 105 104  CO2 29 27 24 23   GLUCOSE 144* 156* 91 108*  BUN 13 13 10 11   CREATININE 0.96 1.23* 0.74 0.84  CALCIUM  9.5 9.8 8.6* 9.1  MG  --   --  1.4* 1.9   Liver Function Tests: Recent Labs  Lab 10/26/23 1025 10/29/23 1354 10/30/23 0445  AST 23 52* 183*  ALT 14 33 112*  ALKPHOS 127* 126 184*  BILITOT 0.5 1.0 0.9  PROT 7.1 7.3 6.1*  ALBUMIN  3.6 3.0* 2.3*   CBC: Recent Labs  Lab 10/26/23 1025 10/29/23 1354 10/31/23 0150  WBC 3.6* 5.0 4.3  NEUTROABS 2.2 4.3 3.3  HGB 11.6* 11.5* 9.2*  HCT 34.6* 35.0* 27.8*  MCV 99.1 101.4* 99.6  PLT 203 185 161   D-Dimer Recent Labs    10/29/23 1354  DDIMER 1.04*   Sepsis Labs Recent Labs  Lab 10/26/23 1025 10/29/23 1354 10/31/23 0150  WBC 3.6* 5.0 4.3   Synovial Fluid Analysis Synovial cell count + diff, w/ crystals [[id number]] (Abnormal)   Collected: 10/30/23 1110   Updated: 10/30/23 1305    Color, Synovial YELLOW   Appearance-Synovial HAZY Abnormal    Crystals, Fluid NO CRYSTALS SEEN   WBC, Synovial 8,550 High  /cu mm   Neutrophil, Synovial 83 High  %   Lymphocytes-Synovial Fld 7 %   Monocyte-Macrophage-Synovial Fluid 4 Low  %   Eosinophils-Synovial 6 High      Procedures/Studies: DG Knee Right Port Result Date: 10/30/2023 CLINICAL DATA:  Right knee pain. EXAM: PORTABLE RIGHT KNEE - 1-2 VIEW COMPARISON:  Tibia/fibular radiographs 10/03/2023 FINDINGS: The alignment and joint spaces are normal. Medial and lateral tibiofemoral chondrocalcinosis. No fracture, erosion, or bony destructive change. Trace peripheral spurring in the tibiofemoral compartments.  Generalized soft tissue edema. Small joint effusion. IMPRESSION: 1. Generalized soft tissue edema and small joint effusion. No acute osseous abnormality. 2. Chondrocalcinosis. Electronically Signed   By: Andrea Gasman M.D.   On: 10/30/2023 14:12   VAS US  LOWER EXTREMITY VENOUS (DVT) (7a-7p) Result Date: 10/30/2023  Lower Venous DVT Study Patient Name:  Julie Nicholson  Date of Exam:   10/29/2023 Medical Rec #: [id number]         Accession #:    [id number] Date of Birth: 1965/02/08         Patient Gender: F Patient Age:   38 years Exam Location:  [facility] Procedure:      VAS US  LOWER EXTREMITY VENOUS (DVT) Referring Phys: JONATHAN GILLIAM --------------------------------------------------------------------------------  Indications: Pain, Swelling, and Muscle spasms.  Limitations: Edema and pain with compression. Comparison Study: Prior negative right LEV done 10/01/2023 Performing Technologist: Alberta Lis RVS  Examination Guidelines: A complete evaluation includes B-mode imaging, spectral Doppler, color Doppler, and power Doppler as needed of all accessible portions of each vessel. Bilateral testing is considered an integral part of a complete examination. Limited examinations for reoccurring indications may be performed as noted. The reflux portion of the exam is performed with the patient in reverse Trendelenburg.  +---------+---------------+---------+-----------+----------+-------------------+ RIGHT    CompressibilityPhasicitySpontaneityPropertiesThrombus Aging      +---------+---------------+---------+-----------+----------+-------------------+ CFV      Full           Yes      Yes                                      +---------+---------------+---------+-----------+----------+-------------------+ SFJ      Full                                                             +---------+---------------+---------+-----------+----------+-------------------+ FV Prox  Full            Yes      Yes                                      +---------+---------------+---------+-----------+----------+-------------------+  FV Mid   Full           Yes      Yes                                      +---------+---------------+---------+-----------+----------+-------------------+ FV DistalFull           Yes      Yes                                      +---------+---------------+---------+-----------+----------+-------------------+ PFV      Full           Yes      Yes                                      +---------+---------------+---------+-----------+----------+-------------------+ POP                     Yes      Yes                  patent by color and                                                       Doppler             +---------+---------------+---------+-----------+----------+-------------------+ PTV      Full                                                             +---------+---------------+---------+-----------+----------+-------------------+ PERO     Full                                                             +---------+---------------+---------+-----------+----------+-------------------+   +----+---------------+---------+-----------+----------+--------------+ LEFTCompressibilityPhasicitySpontaneityPropertiesThrombus Aging +----+---------------+---------+-----------+----------+--------------+ CFV Full           Yes      Yes                                 +----+---------------+---------+-----------+----------+--------------+ SFJ Full           Yes      Yes                                 +----+---------------+---------+-----------+----------+--------------+     Summary: RIGHT: - Findings appear essentially unchanged compared to previous examination. - There is no evidence of deep vein thrombosis in the lower extremity.  - No cystic structure found in the popliteal fossa. - Ultrasound characteristics of enlarged  lymph nodes are noted in the groin.  LEFT: - No evidence of common femoral  vein obstruction.  - Ultrasound characteristics of enlarged lymph nodes noted in the groin.  *See table(s) above for measurements and observations. Electronically signed by Lonni Gaskins MD on 10/30/2023 at 9:20:25 AM.    Final    CT Angio Chest PE W and/or Wo Contrast Result Date: 10/29/2023 CLINICAL DATA:  Suspected pulmonary embolism. EXAM: CT ANGIOGRAPHY CHEST WITH CONTRAST TECHNIQUE: Multidetector CT imaging of the chest was performed using the standard protocol during bolus administration of intravenous contrast. Multiplanar CT image reconstructions and MIPs were obtained to evaluate the vascular anatomy. RADIATION DOSE REDUCTION: This exam was performed according to the departmental dose-optimization program which includes automated exposure control, adjustment of the mA and/or kV according to patient size and/or use of iterative reconstruction technique. CONTRAST:  75mL OMNIPAQUE  IOHEXOL  350 MG/ML SOLN COMPARISON:  August 18, 2023 FINDINGS: Cardiovascular: A right-sided venous Port-A-Cath is in place. The thoracic aorta is normal in appearance, without evidence of aortic aneurysm or dissection. Satisfactory opacification of the pulmonary arteries to the segmental level. No evidence of pulmonary embolism. Normal heart size. There is a small pericardial effusion. Mediastinum/Nodes: Stable areas of treated mediastinal and right hilar soft tissue attenuation are seen without evidence of enlarged mediastinal, hilar or axillary lymph nodes. There is moderate severity diffuse circumferential mural thickening along the length of the esophagus. The thyroid  gland and trachea demonstrate no significant findings. Lungs/Pleura: There is extensive emphysematous lung disease with stable diffuse bronchial wall thickening. An area of mild to moderate severity, residual heterogeneous airspace opacity is seen within the right lower lobe. This is  decreased in severity when compared to the prior study. There is stable right perihilar and right suprahilar post treatment radiation fibrosis and consolidation. A trace amount of pleural fluid is seen on the right. No pneumothorax is identified. Upper Abdomen: Multiple surgical clips are seen within the gallbladder fossa. Musculoskeletal: No chest wall abnormality. No acute or significant osseous findings. Review of the MIP images confirms the above findings. IMPRESSION: 1. No evidence of pulmonary embolism. 2. 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 3. Mild to moderate severity, residual heterogeneous airspace opacity within the right lower lobe, decreased in severity when compared to the prior study. 4. Trace amount of right pleural fluid. 5. Small pericardial effusion. 6. Moderate severity diffuse circumferential mural thickening along the length of the esophagus, which may represent sequelae associated with esophagitis. 7. Evidence of prior cholecystectomy. 8. Emphysema. Electronically Signed   By: Suzen Dials M.D.   On: 10/29/2023 21:10   CT TIBIA FIBULA RIGHT W CONTRAST Result Date: 10/03/2023 CLINICAL DATA:  Redness, swelling, pain. EXAM: CT OF THE LOWER RIGHT EXTREMITY WITH CONTRAST TECHNIQUE: Multidetector CT imaging of the lower right extremity was performed according to the standard protocol following intravenous contrast administration. RADIATION DOSE REDUCTION: This exam was performed according to the departmental dose-optimization program which includes automated exposure control, adjustment of the mA and/or kV according to patient size and/or use of iterative reconstruction technique. CONTRAST:  75mL OMNIPAQUE  IOHEXOL  350 MG/ML SOLN COMPARISON:  None Available. FINDINGS: Bones/Joint/Cartilage Moderate joint effusion with synovial enhancement. There is a Engineer, production cyst tracking posteriorly. Moderate knee chondrocalcinosis. No fracture. No  erosions or bony destructive change. No periostitis. The ankle mortise is preserved. Ligaments Suboptimally assessed by CT. Muscles and Tendons Tenosynovial fluid about the posterior tibial tendon at the ankle. Perifascial fluid about the posterior and anterior muscle compartments of the calf, and to a lesser extent the posterior thigh muscle compartment.  No organized or drainable fluid collection. Soft tissues Subcutaneous edema, more confluent near the ankle. No subcutaneous collection. The vascular structures appear patent. No soft tissue gas or radiopaque foreign body. IMPRESSION: 1. Moderate joint effusion with synovial enhancement. Etiology is indeterminate, however infection is considered. Consider fluid sampling. 2. Perifascial fluid about the posterior and anterior muscle compartments of the calf, and to a lesser extent the posterior thigh. No organized or drainable fluid collection. 3. Tenosynovial fluid about the posterior tibial tendon at the ankle. 4. Subcutaneous edema, more confluent near the ankle. No subcutaneous collection. 5. No acute osseous abnormality. 6. Chondrocalcinosis about the knee. Electronically Signed   By: Andrea Gasman M.D.   On: 10/03/2023 19:57   VAS US  LOWER EXTREMITY VENOUS (DVT) (ONLY [facility] & [facility]) Result Date: 10/03/2023  Lower Venous DVT Study Patient Name:  Julie Nicholson  Date of Exam:   10/01/2023 Medical Rec #: [id number]         Accession #:    [id number] Date of Birth: 30-Nov-1965         Patient Gender: F Patient Age:   43 years Exam Location:  [facility] Procedure:      VAS US  LOWER EXTREMITY VENOUS (DVT) Referring Phys: LONNI CAMP --------------------------------------------------------------------------------  Indications: Muscle spasms, knee and posterior calf pain. Hit leg on dog crate yesterday, increased pain and swelling today.  Risk Factors: Cancer Metastatic stage IV non-small cell lung cancer. On palliative systemic chemotherapy and Limitations:  Involuntary patient movement secondary to spasms and pain. Holding breath during spasms. Comparison Study: No prior study on file Performing Technologist: Alberta Lis RVS  Examination Guidelines: A complete evaluation includes B-mode imaging, spectral Doppler, color Doppler, and power Doppler as needed of all accessible portions of each vessel. Bilateral testing is considered an integral part of a complete examination. Limited examinations for reoccurring indications may be performed as noted. The reflux portion of the exam is performed with the patient in reverse Trendelenburg.  +---------+---------------+---------+-----------+----------+-------------------+ RIGHT    CompressibilityPhasicitySpontaneityPropertiesThrombus Aging      +---------+---------------+---------+-----------+----------+-------------------+ CFV      Full           Yes      No                                       +---------+---------------+---------+-----------+----------+-------------------+ SFJ      Full                                                             +---------+---------------+---------+-----------+----------+-------------------+ FV Prox  Full           Yes      Yes                                      +---------+---------------+---------+-----------+----------+-------------------+ FV Mid   Full                                                             +---------+---------------+---------+-----------+----------+-------------------+  FV DistalFull           Yes      Yes                                      +---------+---------------+---------+-----------+----------+-------------------+ PFV      Full           Yes      Yes                                      +---------+---------------+---------+-----------+----------+-------------------+ POP                     Yes      Yes                  patent by color and                                                        Doppler             +---------+---------------+---------+-----------+----------+-------------------+ PTV      Full                                                             +---------+---------------+---------+-----------+----------+-------------------+ PERO     Full                                                             +---------+---------------+---------+-----------+----------+-------------------+   +----+---------------+---------+-----------+----------+--------------+ LEFTCompressibilityPhasicitySpontaneityPropertiesThrombus Aging +----+---------------+---------+-----------+----------+--------------+ CFV Full           Yes      Yes                                 +----+---------------+---------+-----------+----------+--------------+ SFJ Full                                                        +----+---------------+---------+-----------+----------+--------------+     Summary: RIGHT: - There is no evidence of deep vein thrombosis in the lower extremity.  - No cystic structure found in the popliteal fossa.  LEFT: - No evidence of common femoral vein obstruction.   *See table(s) above for measurements and observations. Electronically signed by Debby Robertson on 10/03/2023 at 11:27:25 AM.    Final     Time coordinating discharge: 55 mins  SIGNED:  Camellia Door, DO [facility] 10/31/23, 1:07 PM

## 2023-10-31 NOTE — Progress Notes (Signed)
 MD notified Marcaine  and depo-medrol  is at bedside. Consent has been signed.

## 2023-10-31 NOTE — Plan of Care (Signed)

## 2023-10-31 NOTE — Progress Notes (Signed)
 Explained discharge instructions to patient. Reviewed follow up appointment and next medication administration times. Also reviewed education. Patient verbalized having an understanding for instructions given. All belongings are in the patient's possession. IV team deaccessed patient's port. RN removed patient's telemetry. No other needs verbalized. Transporting downstairs to discharge lounge.

## 2023-10-31 NOTE — Procedures (Signed)
 Procedure: Right knee injection   Indication: Right knee pain   Surgeon: Ozell Ned, PA-C   Assist: None   Anesthesia: Topical refrigerant   EBL: None   Complications: None   Findings: After risks/benefits explained patient desires to undergo procedure. Consent obtained and time out performed. The right knee was sterilely prepped. Joint was accessed and 5ml 0.5% Marcaine  and 40mg  depo-medrol  instilled. Pt tolerated the procedure well.       Ozell DOROTHA Ned, PA-C Orthopedic Surgery [phone]

## 2023-10-31 NOTE — Plan of Care (Signed)

## 2023-11-01 ENCOUNTER — Other Ambulatory Visit: Payer: Self-pay | Admitting: Emergency Medicine

## 2023-11-01 DIAGNOSIS — J449 Chronic obstructive pulmonary disease, unspecified: Secondary | ICD-10-CM

## 2023-11-01 MED ORDER — STIOLTO RESPIMAT 2.5-2.5 MCG/ACT IN AERS: 2.0000 | INHALATION_SPRAY | Freq: Every day | RESPIRATORY_TRACT | 3 refills | Status: DC

## 2023-11-01 MED ORDER — ALBUTEROL SULFATE (2.5 MG/3ML) 0.083% IN NEBU: 2.5000 mg | INHALATION_SOLUTION | Freq: Four times a day (QID) | RESPIRATORY_TRACT | 5 refills | Status: DC | PRN

## 2023-11-01 NOTE — Telephone Encounter (Signed)
 Refills have been sent. Please close out other telephone encounter as well. Thanks.

## 2023-11-01 NOTE — Addendum Note (Signed)
 Addended by: Alesa Echevarria V on: 11/01/2023 10:28 AM   Modules accepted: Orders

## 2023-11-01 NOTE — Telephone Encounter (Unsigned)
 Copied from CRM #[id number]. Topic: Clinical - Medication Refill >> Nov 01, 2023  9:52 AM Rozanna G wrote: Medication: albuterol  (VENTOLIN  HFA) 108 (90 Base) MCG/ACT inhaler, albuterol  (PROVENTIL ) (2.5 MG/3ML) 0.083% nebulizer solution, and Tiotropium Bromide-Olodaterol (STIOLTO RESPIMAT ) 2.5-2.5 MCG/ACT AERS  Has the patient contacted their pharmacy? Yes (Agent: If no, request that the patient contact the pharmacy for the refill. If patient does not wish to contact the pharmacy document the reason why and proceed with request.) (Agent: If yes, when and what did the pharmacy advise?)  This is the patient's preferred pharmacy:  [facility] [address] [place] - [address]  [place] [address] Phone: [phone] Fax: [phone]   Is this the correct pharmacy for this prescription? Yes If no, delete pharmacy and type the correct one.   Has the prescription been filled recently? Yes  Is the patient out of the medication? No  Has the patient been seen for an appointment in the last year OR does the patient have an upcoming appointment? Yes  Can we respond through MyChart? Yes  Agent: Please be advised that Rx refills may take up to 3 business days. We ask that you follow-up with your pharmacy.

## 2023-11-02 ENCOUNTER — Other Ambulatory Visit: Payer: Self-pay | Admitting: Physician Assistant

## 2023-11-02 DIAGNOSIS — E039 Hypothyroidism, unspecified: Secondary | ICD-10-CM

## 2023-11-03 ENCOUNTER — Other Ambulatory Visit: Payer: Self-pay

## 2023-11-03 DIAGNOSIS — J449 Chronic obstructive pulmonary disease, unspecified: Secondary | ICD-10-CM

## 2023-11-03 LAB — CULTURE, BLOOD (ROUTINE X 2)
Culture: NO GROWTH
Culture: NO GROWTH
Special Requests: ADEQUATE
Special Requests: ADEQUATE

## 2023-11-03 MED ORDER — ALBUTEROL SULFATE (2.5 MG/3ML) 0.083% IN NEBU: 2.5000 mg | INHALATION_SOLUTION | Freq: Four times a day (QID) | RESPIRATORY_TRACT | 5 refills | Status: DC | PRN

## 2023-11-03 MED ORDER — ALBUTEROL SULFATE (2.5 MG/3ML) 0.083% IN NEBU: 2.5000 mg | INHALATION_SOLUTION | Freq: Four times a day (QID) | RESPIRATORY_TRACT | 5 refills | Status: AC | PRN

## 2023-11-03 MED ORDER — STIOLTO RESPIMAT 2.5-2.5 MCG/ACT IN AERS: 2.0000 | INHALATION_SPRAY | Freq: Every day | RESPIRATORY_TRACT | 3 refills | Status: DC

## 2023-11-03 NOTE — Telephone Encounter (Signed)
 Please see 10/31/2023 phone note.

## 2023-11-03 NOTE — Addendum Note (Signed)
 Addended by: MELVENIA WILFORD SAUNDERS on: 11/03/2023 08:59 AM   Modules accepted: Orders

## 2023-11-03 NOTE — Telephone Encounter (Signed)
 Rx were ordered as print. I have re ordered meds and notified patient. Nothing further needed.

## 2023-11-04 LAB — AEROBIC/ANAEROBIC CULTURE W GRAM STAIN (SURGICAL/DEEP WOUND): Culture: NO GROWTH

## 2023-11-08 ENCOUNTER — Other Ambulatory Visit: Payer: Self-pay

## 2023-11-09 ENCOUNTER — Encounter: Payer: Self-pay | Admitting: Internal Medicine

## 2023-11-09 ENCOUNTER — Ambulatory Visit (HOSPITAL_COMMUNITY)

## 2023-11-10 ENCOUNTER — Ambulatory Visit (HOSPITAL_BASED_OUTPATIENT_CLINIC_OR_DEPARTMENT_OTHER)
Admission: RE | Admit: 2023-11-10 | Discharge: 2023-11-10 | Disposition: A | Discharge status: Home / Self Care | Source: Ambulatory Visit | Attending: Internal Medicine | Admitting: Internal Medicine

## 2023-11-10 ENCOUNTER — Other Ambulatory Visit: Payer: Self-pay | Admitting: Emergency Medicine

## 2023-11-10 DIAGNOSIS — J3089 Other allergic rhinitis: Secondary | ICD-10-CM

## 2023-11-10 DIAGNOSIS — C3490 Malignant neoplasm of unspecified part of unspecified bronchus or lung: Secondary | ICD-10-CM | POA: Insufficient documentation

## 2023-11-10 MED ORDER — IOHEXOL 300 MG/ML  SOLN
100.0000 mL | Freq: Once | INTRAMUSCULAR | Status: AC | PRN
  Administered 2023-11-10: 80 mL via INTRAVENOUS

## 2023-11-14 ENCOUNTER — Telehealth: Payer: Self-pay | Admitting: *Deleted

## 2023-11-14 NOTE — Telephone Encounter (Signed)
 Disability Update claim number [id number] completed to provider for review and signature received today.  Successfully returned via fax: [phone].  Copy to [facility] HIM bin for items to be scanned,  ready for pick up to [facility] near registrar no[address].  No further actions preformed or required by this nurse. Second copy received today is identical except for a copy of what Suzen JINNY Hess provided to [facility].

## 2023-11-15 ENCOUNTER — Other Ambulatory Visit: Payer: Self-pay | Admitting: *Deleted

## 2023-11-15 ENCOUNTER — Telehealth: Payer: Self-pay | Admitting: Medical Oncology

## 2023-11-15 ENCOUNTER — Telehealth: Payer: Self-pay | Admitting: *Deleted

## 2023-11-15 DIAGNOSIS — C3491 Malignant neoplasm of unspecified part of right bronchus or lung: Secondary | ICD-10-CM

## 2023-11-15 NOTE — Telephone Encounter (Signed)
 Julie Nicholson  stated that the unum claim form had been completed.

## 2023-11-15 NOTE — Telephone Encounter (Signed)
 Returned call and left message to call back in regards to claim # [id number]

## 2023-11-16 ENCOUNTER — Inpatient Hospital Stay

## 2023-11-16 ENCOUNTER — Inpatient Hospital Stay (HOSPITAL_BASED_OUTPATIENT_CLINIC_OR_DEPARTMENT_OTHER): Admitting: Internal Medicine

## 2023-11-16 ENCOUNTER — Inpatient Hospital Stay: Attending: Internal Medicine

## 2023-11-16 ENCOUNTER — Encounter: Payer: Self-pay | Admitting: Internal Medicine

## 2023-11-16 VITALS — BP 122/68 | HR 100 | Temp 96.5°F | Resp 17 | Ht 61.0 in | Wt 97.3 lb

## 2023-11-16 DIAGNOSIS — C770 Secondary and unspecified malignant neoplasm of lymph nodes of head, face and neck: Secondary | ICD-10-CM | POA: Diagnosis not present

## 2023-11-16 DIAGNOSIS — C3491 Malignant neoplasm of unspecified part of right bronchus or lung: Secondary | ICD-10-CM

## 2023-11-16 DIAGNOSIS — Z5112 Encounter for antineoplastic immunotherapy: Secondary | ICD-10-CM | POA: Diagnosis present

## 2023-11-16 DIAGNOSIS — C3411 Malignant neoplasm of upper lobe, right bronchus or lung: Secondary | ICD-10-CM | POA: Diagnosis present

## 2023-11-16 DIAGNOSIS — Z5111 Encounter for antineoplastic chemotherapy: Secondary | ICD-10-CM | POA: Diagnosis present

## 2023-11-16 LAB — CBC WITH DIFFERENTIAL (CANCER CENTER ONLY)
Abs Immature Granulocytes: 0.01 K/uL (ref 0.00–0.07)
Basophils Absolute: 0 K/uL (ref 0.0–0.1)
Basophils Relative: 0 %
Eosinophils Absolute: 0 K/uL (ref 0.0–0.5)
Eosinophils Relative: 1 %
HCT: 34 % — ABNORMAL LOW (ref 36.0–46.0)
Hemoglobin: 11.1 g/dL — ABNORMAL LOW (ref 12.0–15.0)
Immature Granulocytes: 0 %
Lymphocytes Relative: 22 %
Lymphs Abs: 0.7 K/uL (ref 0.7–4.0)
MCH: 33.4 pg (ref 26.0–34.0)
MCHC: 32.6 g/dL (ref 30.0–36.0)
MCV: 102.4 fL — ABNORMAL HIGH (ref 80.0–100.0)
Monocytes Absolute: 0.4 K/uL (ref 0.1–1.0)
Monocytes Relative: 14 %
Neutro Abs: 1.9 K/uL (ref 1.7–7.7)
Neutrophils Relative %: 63 %
Platelet Count: 174 K/uL (ref 150–400)
RBC: 3.32 MIL/uL — ABNORMAL LOW (ref 3.87–5.11)
RDW: 16.8 % — ABNORMAL HIGH (ref 11.5–15.5)
WBC Count: 3 K/uL — ABNORMAL LOW (ref 4.0–10.5)
nRBC: 0 % (ref 0.0–0.2)

## 2023-11-16 LAB — CMP (CANCER CENTER ONLY)
ALT: 20 U/L (ref 0–44)
AST: 22 U/L (ref 15–41)
Albumin: 3.5 g/dL (ref 3.5–5.0)
Alkaline Phosphatase: 144 U/L — ABNORMAL HIGH (ref 38–126)
Anion gap: 6 (ref 5–15)
BUN: 15 mg/dL (ref 6–20)
CO2: 31 mmol/L (ref 22–32)
Calcium: 9.7 mg/dL (ref 8.9–10.3)
Chloride: 105 mmol/L (ref 98–111)
Creatinine: 0.99 mg/dL (ref 0.44–1.00)
GFR, Estimated: >60 mL/min (ref >60)
Glucose, Bld: 107 mg/dL — ABNORMAL HIGH (ref 70–99)
Potassium: 4 mmol/L (ref 3.5–5.1)
Sodium: 142 mmol/L (ref 135–145)
Total Bilirubin: 0.4 mg/dL (ref 0.0–1.2)
Total Protein: 6.9 g/dL (ref 6.5–8.1)

## 2023-11-16 MED ORDER — SODIUM CHLORIDE 0.9 % IV SOLN: Freq: Once | INTRAVENOUS | Status: AC

## 2023-11-16 MED ORDER — SODIUM CHLORIDE 0.9 % IV SOLN
400.0000 mg/m2 | Freq: Once | INTRAVENOUS | Status: AC
  Administered 2023-11-16: 600 mg via INTRAVENOUS
  Filled 2023-11-16: qty 20

## 2023-11-16 MED ORDER — SODIUM CHLORIDE 0.9 % IV SOLN
200.0000 mg | Freq: Once | INTRAVENOUS | Status: AC
  Administered 2023-11-16: 200 mg via INTRAVENOUS
  Filled 2023-11-16: qty 200

## 2023-11-16 MED ORDER — ONDANSETRON HCL 4 MG/2ML IJ SOLN
8.0000 mg | Freq: Once | INTRAMUSCULAR | Status: AC
  Administered 2023-11-16: 8 mg via INTRAVENOUS
  Filled 2023-11-16: qty 4

## 2023-11-16 NOTE — Progress Notes (Signed)
 [facility] [phone]   [phone]  OFFICE PROGRESS NOTE  Julie, Graves, NP [address] [place] [address]  DIAGNOSIS: Recurrent/metastatic non-small cell lung cancer initially diagnosed as stage IIIB  (T1b, N3, M0) non-small cell lung cancer, adenocarcinoma she presented with right upper lobe nodule in addition to bulky right hilar, mediastinal, and left supraclavicular lymphadenopathy diagnosed in November 2022.  The patient had evidence for disease recurrence in the mediastinal and right supraclavicular lymphadenopathy in August 2023.  The patient had evidence of metastatic disease in February 2024 with several supraclavicular, thoracic and precarinal lymphadenopathy as well as upper abdominal lymph nodes and small liver lesion.  DETECTED ALTERATION(S) / BIOMARKER(S) % CFDNA OR AMPLIFICATION ASSOCIATED FDA-APPROVED THERAPIES CLINICAL TRIAL AVAILABILITY [id number] ND 0.5 5 50 100 4.7%  RHOAG17E ND 0.5 5 50 100 1.8%  [id number] ND 0.5 5 50 100 1.9%  BIOMARKER ADDITIONAL DETAILS Tumor Mutational Burden (TMB) 19.02 mut/Mb MSI Status Stable (MSS) PD-L1 Tumor Proportion Score (TPS)* <1%  Biomarker Findings By [facility] on 05/06/2022 Tumor Mutational Burden - 17 Muts/Mb Microsatellite status - MS-Stable Genomic Findings For a complete list of the genes assayed, please refer to the Appendix. CTNNB1 S37C CRKL amplification MAP2K4 loss exons 2-11 TP53 V143A 8 Disease relevant genes with no reportable alterations: ALK, BRAF, EGFR, ERBB2, KRAS, MET, RET, ROS1  PDL1 0%   PRIOR THERAPY:  1) Concurrent chemoradiation with carboplatin  for an AUC of 2 and paclitaxel  45 mg per metered squared.  First dose on 01/05/2021.  Status post 7 cycles of treatment.  Last dose was given February 16, 2021. 2) Consolidation immunotherapy with Imfinzi  1500 Mg IV every 4 weeks.  First dose March 25, 2021.  Status post 3 cycles.  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 3) Palliative radiotherapy to the enlarging right supraclavicular lymphadenopathy under the care of Dr. Shannon expected to be completed on October 28, 2021.   CURRENT THERAPY: Palliative systemic chemotherapy with carboplatin  for AUC of 5, Alimta  500 Mg/M2 and Keytruda  200 Mg IV every 3 weeks.  First dose April 05, 2022.  Status post 25 cycles.  Starting from cycle #3 her dose of carboplatin  was reduced to AUC of 4 and Alimta  400 Mg/M2.  Starting from cycle #5 the patient will be on maintenance treatment with Alimta  and Keytruda  every 3 weeks.  INTERVAL HISTORY: Julie Nicholson 58 y.o. female returns to the clinic today for follow-up visit.  Discussed the use of AI scribe software for clinical note transcription with the patient, who gave verbal consent to proceed.  History of Present Illness Julie Nicholson is a 58 year old female with recurrent metastatic non-small cell lung cancer who presents for follow-up and treatment.  She is undergoing maintenance therapy with Alimta  and Keytruda  every three weeks, having completed 25 cycles. She previously completed full chemotherapy regimens including carboplatin , Alimta , and Keytruda  for four cycles before transitioning to maintenance therapy.  She experiences new onset mild stomach pain without nausea or vomiting and maintains regular bowel movements. Additionally, she has intermittent middle back pain that 'comes and goes'.  She feels depressed due to her mother's declining health. Her mother, who has rectal and cervical cancer, is under hospice care at home. She is actively involved in her mother's care, managing appointments and dealing with recent health complications, including a wound on her mother's heel that led to a partial leg amputation and a new wound on  her bottom that may have reached the bone.  Her weight has slightly increased from 95 to 97.3 pounds,  attributing some of the fluctuation to stress and heat affecting her appetite. She is considering nutritional supplements similar to those given to her mother, who is also not eating well.  She experiences tremors on her right side, which she attributes to stress and anxiety related to her mother's condition. She feels nervous and anxious, especially when discussing her mother's health.  No recent weight loss, nausea, or vomiting. Regular bowel movements and no significant changes in her overall health since the last visit.     MEDICAL HISTORY: Past Medical History:  Diagnosis Date   Abdominal discomfort    Acute sinusitis 11/11/2021   Adenocarcinoma of right lung, stage 4 (HCC) 12/18/2020   Allergic rhinitis 05/08/2021   Antineoplastic chemotherapy induced pancytopenia 02/23/2021   Aortic atherosclerosis 06/24/2023   Cancer (HCC)    Chronic cholecystitis 03/13/2013   Chronic headaches    due to allergies, sinus   Chronic venous hypertension (idiopathic) with other complications of bilateral lower extremity    Constipation 06/23/2023   COPD (chronic obstructive pulmonary disease) (HCC)    per 2012 chest xray   pt states she doesn not have this now (04/10/2013)   COVID-19 virus infection 02/24/2021   Deviated nasal septum    Dyspnea 04/07/2021   Encounter for antineoplastic chemotherapy 12/18/2020   Encounter for antineoplastic immunotherapy 11/17/2022   Eustachian tube dysfunction    Former smoker 07/20/2023   GERD (gastroesophageal reflux disease)    occasional uses Tums / Rolaids   Goals of care, counseling/discussion 03/18/2021   Hearing loss    right ear   HFrEF (heart failure with reduced ejection fraction) (HCC) 07/13/2023   High cholesterol    History of radiation therapy    right lung 01/07/2021-02/19/2021  Dr Lynwood Nasuti   History of radiation therapy    Lymph nodes of head,face,neck- 12/23/22-01/07/23- Dr. 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  MEDICATIONS:  Current Outpatient Medications  Medication Sig Dispense Refill   acetaminophen  (TYLENOL ) 500 MG tablet Take 500 mg by mouth every 6 (six) hours as needed for moderate pain (pain score 4-6).     albuterol  (PROVENTIL ) (2.5 MG/3ML) 0.083% nebulizer solution Take 3 mLs (2.5 mg total) by nebulization every 6 (six) hours as needed. 120 mL 5   albuterol  (VENTOLIN  HFA) 108 (90 Base) MCG/ACT inhaler Inhale 1-2 puffs into the lungs every 6 (six) hours as needed for wheezing or shortness of breath. 18 g 3   aspirin  EC 81 MG tablet Take 81 mg by mouth at bedtime.     atorvastatin  (LIPITOR) 40 MG tablet Take 40 mg by mouth at bedtime.  azelastine  (ASTELIN ) 0.1 % nasal spray Place 1 spray into both nostrils 2 (two) times daily as needed. Use in each nostril as directed 30 mL 5   benzonatate  (TESSALON ) 200 MG capsule Take 1 capsule (200 mg total) by mouth 3 (three) times daily as needed. 30 capsule 0   Biotin 5000 MCG TABS Take 10,000 mcg by mouth at bedtime.     buPROPion  (WELLBUTRIN  XL) 150 MG 24 hr tablet Take 150 mg by mouth at bedtime.     Cyanocobalamin  (VITAMIN B-12) 5000 MCG TBDP Take 5,000 mcg by mouth at bedtime.     docusate sodium  (COLACE) 100 MG capsule Take 200 mg by mouth at bedtime.      famotidine  (PEPCID ) 20 MG tablet Take 1 tablet by mouth twice daily (Patient taking differently: Take 20 mg by mouth at bedtime.) 60 tablet 0   fluticasone  (FLONASE ) 50 MCG/ACT nasal spray Place 2 sprays into both nostrils daily. (Patient taking differently: Place 2 sprays into both nostrils daily as needed for allergies.) 16 g 5   folic acid  (FOLVITE ) 1 MG tablet Take 1 tablet by mouth once daily 30 tablet 0   furosemide  (LASIX ) 20 MG tablet Take 20 mg by mouth as needed for fluid or edema. (Patient not taking: Reported on 10/29/2023)     guaiFENesin  (MUCINEX ) 600 MG 12 hr tablet Take 600 mg by mouth at bedtime.     hydrocortisone  2.5 % ointment APPLY OINTMENT EXTERNALLY TWICE DAILY TO THE LOWER LEGS FOR UP TO 2 WEEKS MAXIMUM External; Duration: 30 Days     levothyroxine  (SYNTHROID ) 25 MCG tablet TAKE 1 TABLET BY MOUTH ONCE DAILY BEFORE BREAKFAST 30 tablet 0   lidocaine -prilocaine  (EMLA ) cream APPLY TO PORT-A-CATH 30 TO 60 MINUTES BEFORE TREATMENT 30 g 0   linaCLOtide (LINZESS PO) Take 1 tablet by mouth daily.     loratadine  (CLARITIN ) 10 MG tablet Take 1 tablet by mouth once daily 30 tablet 0   Multiple Vitamins-Minerals (HAIR SKIN AND NAILS FORMULA PO) Take 1 tablet by mouth daily.     Omega-3 Fatty Acids ([employer] NATURAL FISH OIL) 1000 MG CAPS Take 2,000 mg by mouth at bedtime.     ondansetron  (ZOFRAN -ODT) 8 MG disintegrating tablet Take 8 mg by mouth every 8 (eight) hours as needed for nausea or vomiting.     Oxycodone  HCl 10 MG TABS Take 10 mg by mouth 5 (five) times daily.     polyethylene glycol powder (MIRALAX ) 17 GM/SCOOP powder Take 17 g by mouth 2 (two) times daily as needed for mild constipation. 255 g 1   prochlorperazine  (COMPAZINE ) 10 MG tablet Take 1 tablet (10 mg total) by mouth every 6 (six) hours as needed. (Patient not taking: Reported on 10/29/2023) 30 tablet 2   rizatriptan (MAXALT-MLT) 10 MG disintegrating tablet Take 10 mg by mouth as needed for migraine. May repeat in 2 hours if  needed     sertraline  (ZOLOFT ) 50 MG tablet Take 1 tablet by mouth daily.     Tiotropium Bromide-Olodaterol (STIOLTO RESPIMAT ) 2.5-2.5 MCG/ACT AERS Inhale 2 puffs into the lungs daily. 12 g 3   triamcinolone cream (KENALOG) 0.1 % Apply 1 Application topically daily as needed (for nausea).     No current facility-administered medications for this visit.    SURGICAL HISTORY:  Past Surgical History:  Procedure Laterality Date   ABDOMINAL HYSTERECTOMY  1995   tx endometriosis, both ovaries removed   ADENOIDECTOMY     APPENDECTOMY  late 1990's   BIOPSY  02/26/2021   Procedure: BIOPSY;  Surgeon: Teressa Toribio SQUIBB, MD;  Location: [facility];  Service: Endoscopy;;   BRONCHIAL BRUSHINGS  12/15/2020   Procedure: BRONCHIAL BRUSHINGS;  Surgeon: Shelah Lamar RAMAN, MD;  [facility];  Service: Cardiopulmonary;;   BRONCHIAL NEEDLE ASPIRATION BIOPSY  12/15/2020   Procedure: BRONCHIAL NEEDLE ASPIRATION BIOPSIES;  Surgeon: Shelah Lamar RAMAN, MD;  Location: [facility] ENDOSCOPY;  Service: Cardiopulmonary;;   CHOLECYSTECTOMY  04/10/2013   CHOLECYSTECTOMY N/A 04/10/2013   Procedure: LAPAROSCOPIC CHOLECYSTECTOMY WITH INTRAOPERATIVE CHOLANGIOGRAM;  Surgeon: Krystal JINNY Russell, MD;  Location: [facility] OR;  Service: General;  Laterality: N/A;   ELECTROMAGNETIC NAVIGATION BROCHOSCOPY  12/15/2020   Procedure: ELECTROMAGNETIC NAVIGATION BRONCHOSCOPY;  Surgeon: Shelah Lamar RAMAN, MD;  Location: [facility] ENDOSCOPY;  Service: Cardiopulmonary;;   ESOPHAGOGASTRODUODENOSCOPY (EGD) WITH PROPOFOL  N/A 02/26/2021   Procedure: ESOPHAGOGASTRODUODENOSCOPY (EGD) WITH PROPOFOL ;  Surgeon: Teressa Toribio SQUIBB, MD;  Location: [facility] ENDOSCOPY;  Service: Endoscopy;  Laterality: N/A;   EUS N/A 02/16/2013   Procedure: UPPER ENDOSCOPIC ULTRASOUND (EUS) LINEAR;  Surgeon: Toribio SQUIBB Teressa, MD;  Location: [facility] ENDOSCOPY;  Service: Endoscopy;  Laterality: N/A;  radial linear   IR IMAGING GUIDED PORT INSERTION  06/30/2022   KNEE ARTHROSCOPY Right 1980's    cartilage OR   LAPAROSCOPIC ENDOMETRIOSIS FULGURATION  1980's   MYRINGOTOMY WITH TUBE PLACEMENT Right 07/13/2018   Procedure: MYRINGOTOMY WITH TUBE PLACEMENT;  Surgeon: Edda Mt, MD;  Location: [facility];  Service: ENT;  Laterality: Right;   MYRINGOTOMY WITH TUBE PLACEMENT Right 06/04/2021   Procedure: MYRINGOTOMY WITH BUTTERFLY TUBE PLACEMENT;  Surgeon: Edda Mt, MD;  Location: [facility]ERY [facility];  Service: ENT;  Laterality: Right;   NASOPHARYNGOSCOPY EUSTATION TUBE BALLOON DILATION Right 07/13/2018   Procedure: NASOPHARYNGOSCOPY EUSTATION TUBE BALLOON DILATION;  Surgeon: Edda Mt, MD;  Location: [facility];  Service: ENT;  Laterality: Right;   TONSILLECTOMY AND ADENOIDECTOMY  ~ 1980   adenoidectomy   TUBAL LIGATION  ~ 1987   TURBINATE REDUCTION Right 07/13/2018   Procedure: OUTFRACTURE TURBINATE;  Surgeon: Edda Mt, MD;  Location: [facility];  Service: ENT;  Laterality: Right;   TYMPANOSTOMY TUBE PLACEMENT     VIDEO BRONCHOSCOPY WITH ENDOBRONCHIAL ULTRASOUND N/A 12/15/2020   Procedure: ROBOTIC VIDEO BRONCHOSCOPY WITH ENDOBRONCHIAL ULTRASOUND;  Surgeon: Shelah Lamar RAMAN, MD;  Location: [facility] ENDOSCOPY;  Service: Cardiopulmonary;  Laterality: N/A;   WRIST SURGERY Left    w/plate    REVIEW OF SYSTEMS:  Constitutional: positive for fatigue Eyes: negative Ears, nose, mouth, throat, and face: negative Respiratory: negative Cardiovascular: negative Gastrointestinal: negative Genitourinary:negative Integument/breast: negative Hematologic/lymphatic: negative Musculoskeletal:negative Neurological: negative Behavioral/Psych: negative Endocrine: negative Allergic/Immunologic: negative   PHYSICAL EXAMINATION: General appearance: alert, cooperative, fatigued, and no distress Head: Normocephalic, without obvious abnormality, atraumatic Neck: moderate anterior cervical adenopathy, no JVD, supple, symmetrical, trachea midline, and thyroid  not  enlarged, symmetric, no tenderness/mass/nodules Lymph nodes: Cervical, supraclavicular, and axillary nodes normal. Resp: clear to auscultation bilaterally Back: symmetric, no curvature. ROM normal. No CVA tenderness. Cardio: regular rate and rhythm, S1, S2 normal, no murmur, click, rub or gallop GI: soft, non-tender; bowel sounds normal; no masses,  no organomegaly Extremities: extremities normal, atraumatic, no cyanosis or edema Neurologic: Alert and oriented X 3, normal strength and tone. Normal symmetric reflexes. Normal coordination and gait  ECOG PERFORMANCE STATUS: 1 - Symptomatic but completely ambulatory  Blood pressure 122/68, pulse 100, temperature (!) 96.5 F (35.8 C), resp. rate 17, height 5' 1 (1.549 m), weight 97 lb 4.8 oz (44.1 kg), SpO2 96%.  LABORATORY DATA: Lab Results  Component Value Date   WBC 3.0 (L) 11/16/2023   HGB 11.1 (L) 11/16/2023   HCT 34.0 (L) 11/16/2023   MCV 102.4 (H) 11/16/2023   PLT 174 11/16/2023      Chemistry      Component Value Date/Time   NA 139 10/31/2023 0150   K 4.1 10/31/2023 0150   CL 104 10/31/2023 0150   CO2 23 10/31/2023 0150   BUN 11 10/31/2023 0150   CREATININE 0.84 10/31/2023 0150   CREATININE 0.96 10/26/2023 1025      Component Value Date/Time   CALCIUM  9.1 10/31/2023 0150   ALKPHOS 184 (H) 10/30/2023 0445   AST 183 (H) 10/30/2023 0445   AST 23 10/26/2023 1025   ALT 112 (H) 10/30/2023 0445   ALT 14 10/26/2023 1025   BILITOT 0.9 10/30/2023 0445   BILITOT 0.5 10/26/2023 1025       RADIOGRAPHIC STUDIES: CT CHEST ABDOMEN PELVIS W CONTRAST Result Date: 11/13/2023 CLINICAL DATA:  Non-small-cell lung cancer, staging. * Tracking Code: BO * EXAM: CT CHEST, ABDOMEN, AND PELVIS WITH CONTRAST TECHNIQUE: Multidetector CT imaging of the chest, abdomen and pelvis was performed following the standard protocol during bolus administration of intravenous contrast. RADIATION DOSE REDUCTION: This exam was performed according to the  departmental dose-optimization program which includes automated exposure control, adjustment of the mA and/or kV according to patient size and/or use of iterative reconstruction technique. CONTRAST:  80mL OMNIPAQUE  IOHEXOL  300 MG/ML  SOLN COMPARISON:  Multiple priors including CT August 18, 2023 Jun 23, 2023 FINDINGS: CT CHEST FINDINGS Cardiovascular: Right chest Port-A-Cath with tip near the superior cavoatrial junction. Normal caliber thoracic aorta. Normal size heart. No significant pericardial effusion/thickening. Mediastinum/Nodes: No suspicious thyroid  nodule. Similar soft tissue thickening along the right side of the mediastinum and hilum. No pathologically enlarged mediastinal or hilar lymph nodes. Diffuse symmetric esophageal wall thickening unchanged. Lungs/Pleura: Similar masslike consolidation in the perihilar/suprahilar right lung with radiating bandlike opacities extending to the pleural surface architectural distortion and bronchiectasis/bronchiolectasis. No new suspicious nodularity in the treatment bed. Significant improvement in the opacities in the right lower lobe seen on prior examination now with some residual interstitial thickening and minimal ground-glass compatible with a resolving infectious/inflammatory process. Left lung is without suspicious pulmonary nodule or mass. Emphysema. Musculoskeletal: No aggressive lytic or blastic lesion of bone. CT ABDOMEN PELVIS FINDINGS Hepatobiliary: No suspicious hepatic lesion. Gallbladder surgically absent. Similar prominence of the biliary tree with minimal tapering of the common duct into the ampulla. Pancreas: Similar prominence of the proximal pancreatic duct. No pancreatic ductal dilation. Spleen: No splenomegaly. Adrenals/Urinary Tract: No suspicious adrenal nodule/mass. No hydronephrosis. Kidneys demonstrate symmetric enhancement. Urinary bladder is unremarkable for degree of distension. Stomach/Bowel: Stomach is unremarkable for degree of  distension. No pathologic dilation of small or large bowel. Large volume of formed stool in the colon. Vascular/Lymphatic: Normal caliber abdominal aorta. Aortic atherosclerosis. Smooth IVC contours. Increased size of the left periaortic retroperitoneal soft tissue nodule which is in continuity with vessels now measuring 13 x 7 mm on image 67/2 previously 9 x 5 mm. Reproductive: Status post hysterectomy. No adnexal masses. Other: No significant abdominopelvic free fluid. Musculoskeletal: No aggressive lytic or blastic lesion of bone. IMPRESSION: 1. Similar masslike consolidation in the perihilar/suprahilar right lung with radiating bandlike opacities extending to the pleural surface architectural distortion and bronchiectasis/bronchiolectasis, compatible with postradiation change. No new suspicious nodularity in the treatment bed. 2. Significant improvement in the opacities in the right lower lobe seen  on prior examination now with some residual interstitial thickening and minimal ground-glass compatible with a resolving infectious/inflammatory process. 3. Increased size of the left periaortic retroperitoneal soft tissue nodule which is in continuity with vessels now measuring 13 x 7 mm previously 9 x 5 mm. This is nonspecific and may reflect a lymph node or venous varix. Suggest attention on follow-up imaging. 4. Large volume of formed stool in the colon. 5. Aortic Atherosclerosis (ICD10-I70.0) and Emphysema (ICD10-J43.9). Electronically Signed   By: Reyes Holder M.D.   On: 11/13/2023 05:49   ECHOCARDIOGRAM COMPLETE Result Date: 10/31/2023    ECHOCARDIOGRAM REPORT   Patient Name:   Julie Nicholson Date of Exam: 10/31/2023 Medical Rec #:  [id number]        Height:       61.0 in Accession #:    [id number]       Weight:       96.1 lb Date of Birth:  [birth year]        BSA:          1.383 m Patient Age:    57 years         BP:           91/61 mmHg Patient Gender: F                HR:           91 bpm. Exam Location:   Inpatient Procedure: 2D Echo, Cardiac Doppler and Color Doppler (Both Spectral and Color            Flow Doppler were utilized during procedure). Indications:    Pericardial Effusion  History:        Patient has prior history of Echocardiogram examinations, most                 recent 06/10/2023. CHF; COPD.  Sonographer:    Philomena Daring Referring Phys: VASUNDHRA RATHORE IMPRESSIONS  1. Left ventricular ejection fraction, by estimation, is 40 to 45%. The left ventricle has mildly decreased function. The left ventricle has no regional wall motion abnormalities. Left ventricular diastolic parameters were normal.  2. Right ventricular systolic function is normal. The right ventricular size is normal.  3. There is no evidence of cardiac tamponade.  4. The mitral valve is normal in structure. No evidence of mitral valve regurgitation. No evidence of mitral stenosis.  5. The aortic valve is normal in structure. Aortic valve regurgitation is not visualized. No aortic stenosis is present.  6. The inferior vena cava is normal in size with greater than 50% respiratory variability, suggesting right atrial pressure of 3 mmHg. Comparison(s): No significant change from prior study. FINDINGS  Left Ventricle: Left ventricular ejection fraction, by estimation, is 40 to 45%. The left ventricle has mildly decreased function. The left ventricle has no regional wall motion abnormalities. The left ventricular internal cavity size was normal in size. There is no left ventricular hypertrophy. Left ventricular diastolic parameters were normal. Right Ventricle: The right ventricular size is normal. No increase in right ventricular wall thickness. Right ventricular systolic function is normal. Left Atrium: Left atrial size was normal in size. Right Atrium: Right atrial size was normal in size. Pericardium: Trivial pericardial effusion is present. There is no evidence of cardiac tamponade. Mitral Valve: The mitral valve is normal in structure.  No evidence of mitral valve regurgitation. No evidence of mitral valve stenosis. Tricuspid Valve: The tricuspid valve is normal in structure. Tricuspid valve regurgitation is not demonstrated.  No evidence of tricuspid stenosis. Aortic Valve: The aortic valve is normal in structure. Aortic valve regurgitation is not visualized. No aortic stenosis is present. Pulmonic Valve: The pulmonic valve was normal in structure. Pulmonic valve regurgitation is not visualized. No evidence of pulmonic stenosis. Aorta: The aortic root is normal in size and structure. Venous: The inferior vena cava is normal in size with greater than 50% respiratory variability, suggesting right atrial pressure of 3 mmHg. IAS/Shunts: No atrial level shunt detected by color flow Doppler.  LEFT VENTRICLE PLAX 2D LVIDd:         4.40 cm   Diastology LVIDs:         3.50 cm   LV e' medial:    7.94 cm/s LV PW:         0.80 cm   LV E/e' medial:  11.7 LV IVS:        0.80 cm   LV e' lateral:   13.80 cm/s LVOT diam:     1.90 cm   LV E/e' lateral: 6.8 LV SV:         46 LV SV Index:   33 LVOT Area:     2.84 cm  RIGHT VENTRICLE             IVC RV S prime:     10.10 cm/s  IVC diam: 1.90 cm TAPSE (M-mode): 1.8 cm LEFT ATRIUM             Index        RIGHT ATRIUM          Index LA diam:        2.80 cm 2.02 cm/m   RA Area:     7.88 cm LA Vol (A2C):   22.5 ml 16.26 ml/m  RA Volume:   14.00 ml 10.12 ml/m LA Vol (A4C):   14.7 ml 10.63 ml/m LA Biplane Vol: 18.2 ml 13.16 ml/m  AORTIC VALVE LVOT Vmax:   82.70 cm/s LVOT Vmean:  57.500 cm/s LVOT VTI:    0.161 m  AORTA Ao Root diam: 2.50 cm MITRAL VALVE MV Area (PHT): 5.13 cm    SHUNTS MV Decel Time: 148 msec    Systemic VTI:  0.16 m MV E velocity: 93.20 cm/s  Systemic Diam: 1.90 cm MV A velocity: 79.80 cm/s MV E/A ratio:  1.17 Morene Brownie Electronically signed by Morene Brownie Signature Date/Time: 10/31/2023/4:37:10 PM    Final    DG Knee Right Port Result Date: 10/30/2023 CLINICAL DATA:  Right knee pain.  EXAM: PORTABLE RIGHT KNEE - 1-2 VIEW COMPARISON:  Tibia/fibular radiographs 10/03/2023 FINDINGS: The alignment and joint spaces are normal. Medial and lateral tibiofemoral chondrocalcinosis. No fracture, erosion, or bony destructive change. Trace peripheral spurring in the tibiofemoral compartments. Generalized soft tissue edema. Small joint effusion. IMPRESSION: 1. Generalized soft tissue edema and small joint effusion. No acute osseous abnormality. 2. Chondrocalcinosis. Electronically Signed   By: Andrea Gasman M.D.   On: 10/30/2023 14:12   VAS US  LOWER EXTREMITY VENOUS (DVT) (7a-7p) Result Date: 10/30/2023  Lower Venous DVT Study Patient Name:  Julie Nicholson  Date of Exam:   10/29/2023 Medical Rec #: [id number]         Accession #:    [id number] Date of Birth: December 25, 1965         Patient Gender: F Patient Age:   26 years Exam Location:  [facility] Procedure:      VAS US  LOWER EXTREMITY VENOUS (DVT) Referring Phys: JONATHAN  GILLIAM --------------------------------------------------------------------------------  Indications: Pain, Swelling, and Muscle spasms.  Limitations: Edema and pain with compression. Comparison Study: Prior negative right LEV done 10/01/2023 Performing Technologist: Alberta Lis RVS  Examination Guidelines: A complete evaluation includes B-mode imaging, spectral Doppler, color Doppler, and power Doppler as needed of all accessible portions of each vessel. Bilateral testing is considered an integral part of a complete examination. Limited examinations for reoccurring indications may be performed as noted. The reflux portion of the exam is performed with the patient in reverse Trendelenburg.  +---------+---------------+---------+-----------+----------+-------------------+ RIGHT    CompressibilityPhasicitySpontaneityPropertiesThrombus Aging      +---------+---------------+---------+-----------+----------+-------------------+ CFV      Full           Yes      Yes                                       +---------+---------------+---------+-----------+----------+-------------------+ SFJ      Full                                                             +---------+---------------+---------+-----------+----------+-------------------+ FV Prox  Full           Yes      Yes                                      +---------+---------------+---------+-----------+----------+-------------------+ FV Mid   Full           Yes      Yes                                      +---------+---------------+---------+-----------+----------+-------------------+ FV DistalFull           Yes      Yes                                      +---------+---------------+---------+-----------+----------+-------------------+ PFV      Full           Yes      Yes                                      +---------+---------------+---------+-----------+----------+-------------------+ POP                     Yes      Yes                  patent by color and                                                       Doppler             +---------+---------------+---------+-----------+----------+-------------------+ PTV      Full                                                             +---------+---------------+---------+-----------+----------+-------------------+  PERO     Full                                                             +---------+---------------+---------+-----------+----------+-------------------+   +----+---------------+---------+-----------+----------+--------------+ LEFTCompressibilityPhasicitySpontaneityPropertiesThrombus Aging +----+---------------+---------+-----------+----------+--------------+ CFV Full           Yes      Yes                                 +----+---------------+---------+-----------+----------+--------------+ SFJ Full           Yes      Yes                                  +----+---------------+---------+-----------+----------+--------------+     Summary: RIGHT: - Findings appear essentially unchanged compared to previous examination. - There is no evidence of deep vein thrombosis in the lower extremity.  - No cystic structure found in the popliteal fossa. - Ultrasound characteristics of enlarged lymph nodes are noted in the groin.  LEFT: - No evidence of common femoral vein obstruction.  - Ultrasound characteristics of enlarged lymph nodes noted in the groin.  *See table(s) above for measurements and observations. Electronically signed by Lonni Gaskins MD on 10/30/2023 at 9:20:25 AM.    Final    CT Angio Chest PE W and/or Wo Contrast Result Date: 10/29/2023 CLINICAL DATA:  Suspected pulmonary embolism. EXAM: CT ANGIOGRAPHY CHEST WITH CONTRAST TECHNIQUE: Multidetector CT imaging of the chest was performed using the standard protocol during bolus administration of intravenous contrast. Multiplanar CT image reconstructions and MIPs were obtained to evaluate the vascular anatomy. RADIATION DOSE REDUCTION: This exam was performed according to the departmental dose-optimization program which includes automated exposure control, adjustment of the mA and/or kV according to patient size and/or use of iterative reconstruction technique. CONTRAST:  75mL OMNIPAQUE  IOHEXOL  350 MG/ML SOLN COMPARISON:  August 18, 2023 FINDINGS: Cardiovascular: A right-sided venous Port-A-Cath is in place. The thoracic aorta is normal in appearance, without evidence of aortic aneurysm or dissection. Satisfactory opacification of the pulmonary arteries to the segmental level. No evidence of pulmonary embolism. Normal heart size. There is a small pericardial effusion. Mediastinum/Nodes: Stable areas of treated mediastinal and right hilar soft tissue attenuation are seen without evidence of enlarged mediastinal, hilar or axillary lymph nodes. There is moderate severity diffuse circumferential mural thickening along  the length of the esophagus. The thyroid  gland and trachea demonstrate no significant findings. Lungs/Pleura: There is extensive emphysematous lung disease with stable diffuse bronchial wall thickening. An area of mild to moderate severity, residual heterogeneous airspace opacity is seen within the right lower lobe. This is decreased in severity when compared to the prior study. There is stable right perihilar and right suprahilar post treatment radiation fibrosis and consolidation. A trace amount of pleural fluid is seen on the right. No pneumothorax is identified. Upper Abdomen: Multiple surgical clips are seen within the gallbladder fossa. Musculoskeletal: No chest wall abnormality. No acute or significant osseous findings. Review of the MIP images confirms the above findings. IMPRESSION: 1. No evidence of pulmonary embolism. 2. 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 3. Mild to moderate severity,  residual heterogeneous airspace opacity within the right lower lobe, decreased in severity when compared to the prior study. 4. Trace amount of right pleural fluid. 5. Small pericardial effusion. 6. Moderate severity diffuse circumferential mural thickening along the length of the esophagus, which may represent sequelae associated with esophagitis. 7. Evidence of prior cholecystectomy. 8. Emphysema. Electronically Signed   By: Suzen Dials M.D.   On: 10/29/2023 21:10     ASSESSMENT AND PLAN: This is a very pleasant 58 years old white female with stage IIIB (T1b, N3, M0) non-small cell lung cancer, adenocarcinoma diagnosed in November 2022 with no actionable mutation and negative PD-L1 expression. The patient completed a course of concurrent chemoradiation with weekly carboplatin  for AUC of 2 and paclitaxel  45 Mg/M2 status post 7 cycles.  She has been tolerating her treatment well except for the mild odynophagia and skin burns. She was also recently  admitted to the hospital complaining of dysphagia and odynophagia secondary to radiation induced esophagitis.  She is feeling much better but continues to have residual dysphagia.  She is followed by gastroenterology and was seen by Dr. Teressa during her hospitalization. Her scan showed improvement of her disease. I recommended for the patient treatment with consolidation immunotherapy with Imfinzi  1500 Mg IV every 4 weeks.  Status post 3 cycles.  Last dose was given in April 2023.  Her treatment was discontinued secondary to suspicious immunotherapy mediated pneumonitis with significant shortness of breath at that time and she was treated with a tapered dose of prednisone . Unfortunately her scan showed evidence for disease recurrence with enlargement of lower right cervical lymph nodes as well as mediastinal lymphadenopathy.  She has palpable right cervical lymphadenopathy. 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 The patient underwent ultrasound-guided core biopsy of the right supraclavicular lymph nodes but the final pathology showed necrotic tumor cells with complete coagulative necrosis with focal fibrous tissue and histiocytic reaction.  She also had MRI of the brain that showed no evidence of metastatic disease to the brain. The patient was seen by Dr. Shannon and started palliative radiotherapy to the enlarging right supraclavicular lymphadenopathy.  This was completed on October 28, 2021. The patient was found to have metastatic disease in February 2024 with several lymphadenopathy in the chest as well as supraclavicular, upper abdomen as well as suspicious small liver  metastasis. 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  I will send the tissue biopsy to [facility] for molecular studies. She is currently undergoing systemic chemotherapy with carboplatin  for AUC of 5, Alimta  500 Mg/M2 and Keytruda  200 Mg IV every 3 weeks status post 25 cycles.  Starting from cycle #3, I reduced her dose of carboplatin  to AUC of 4 and Alimta  400 Mg/M2.  She has been tolerating her treatment fairly well. She had repeat CT scan of the chest, abdomen and pelvis performed recently.  I personally independently reviewed the scan and discussed the result with the patient today.  Her scan showed no concerning findings for disease progression except for slightly enlarged lymph retroperitoneal lymph node. Assessment and Plan Assessment & Plan Recurrent metastatic non-small  cell lung cancer with intra-abdominal lymphadenopathy Undergoing maintenance therapy with Alimta  and Keytruda  every three weeks, having completed 25 cycles. Recent CT scan shows improvement in lung opacity but reveals a slightly enlarged lymph node in the left para-aortic retroperitoneal area. Abdominal pain is not attributed to this lymphadenopathy. - Continue Alimta  and Keytruda  every three weeks - Monitor lymph node size on next scan  Abdominal pain Reports mild abdominal pain without nausea, vomiting, or changes in bowel habits. Pain is not attributed to intra-abdominal lymphadenopathy and is not considered a significant concern at this time.  Major depressive disorder Reports feeling depressed due to her mother's declining health and hospice care. Experiencing significant stress and emotional burden from caregiving responsibilities.  Right-sided tremor Reports a tremor on her right side, attributed to stress and anxiety related to her  mother's condition. No indication of a neurological cause discussed.  The patient was advised to call immediately if she has any concerning symptoms in the interval.  The patient voices understanding of current disease status and treatment options and is in agreement with the current care plan.  All questions were answered. The patient knows to call the clinic with any problems, questions or concerns. We can certainly see the patient much sooner if necessary.  The total time spent in the appointment was 30 minutes.  Disclaimer: This note was dictated with voice recognition software. Similar sounding words can inadvertently be transcribed and may not be corrected upon review.

## 2023-11-16 NOTE — Patient Instructions (Signed)
 [facility] - A DEPT OF [facility]  Discharge Instructions: Thank you for choosing [facility] to provide your oncology and hematology care.   If you have a lab appointment with the Cancer Center, please go directly to the Cancer Center and check in at the registration area.   Wear comfortable clothing and clothing appropriate for easy access to any Portacath or PICC line.   We strive to give you quality time with your provider. You may need to reschedule your appointment if you arrive late (15 or more minutes).  Arriving late affects you and other patients whose appointments are after yours.  Also, if you miss three or more appointments without notifying the office, you may be dismissed from the clinic at the provider's discretion.      For prescription refill requests, have your pharmacy contact our office and allow 72 hours for refills to be completed.    Today you received the following chemotherapy and/or immunotherapy agents: Pembrolizumab  (Keytruda ) & Pemetrexed  disodium (Alimta )    To help prevent nausea and vomiting after your treatment, we encourage you to take your nausea medication as directed.  BELOW ARE SYMPTOMS THAT SHOULD BE REPORTED IMMEDIATELY: *FEVER GREATER THAN 100.4 F (38 C) OR HIGHER *CHILLS OR SWEATING *NAUSEA AND VOMITING THAT IS NOT CONTROLLED WITH YOUR NAUSEA MEDICATION *UNUSUAL SHORTNESS OF BREATH *UNUSUAL BRUISING OR BLEEDING *URINARY PROBLEMS (pain or burning when urinating, or frequent urination) *BOWEL PROBLEMS (unusual diarrhea, constipation, pain near the anus) TENDERNESS IN MOUTH AND THROAT WITH OR WITHOUT PRESENCE OF ULCERS (sore throat, sores in mouth, or a toothache) UNUSUAL RASH, SWELLING OR PAIN  UNUSUAL VAGINAL DISCHARGE OR ITCHING   Items with * indicate a potential emergency and should be followed up as soon as possible or go to the Emergency Department if any problems should occur.  Please show  the CHEMOTHERAPY ALERT CARD or IMMUNOTHERAPY ALERT CARD at check-in to the Emergency Department and triage nurse.  Should you have questions after your visit or need to cancel or reschedule your appointment, please contact [facility] - A DEPT OF [facility]  Dept: [phone]  and follow the prompts.  Office hours are 8:00 a.m. to 4:30 p.m. Monday - Friday. Please note that voicemails left after 4:00 p.m. may not be returned until the following business day.  We are closed weekends and major holidays. You have access to a nurse at all times for urgent questions. Please call the main number to the clinic Dept: [phone] and follow the prompts.   For any non-urgent questions, you may also contact your provider using MyChart. We now offer e-Visits for anyone 64 and older to request care online for non-urgent symptoms. For details visit [email or web address].   Also download the MyChart app! Go to the app store, search MyChart, open the app, select [place], and log in with your MyChart username and password.

## 2023-11-21 ENCOUNTER — Other Ambulatory Visit: Payer: Self-pay | Admitting: Internal Medicine

## 2023-11-21 DIAGNOSIS — C3411 Malignant neoplasm of upper lobe, right bronchus or lung: Secondary | ICD-10-CM

## 2023-11-23 ENCOUNTER — Other Ambulatory Visit: Payer: Self-pay

## 2023-11-24 NOTE — Progress Notes (Signed)
 11/24/23   CHIEF COMPLAINT Patient presents for  Chief Complaint  Patient presents with  . Consult    Referred by Suzen Lager, NP for eye exam -last eye exam 4 years, [facility] -pt reports decreased vision both eyes and frequent headaches, has stage 4 lung cancer + intermittent watering, burning and itching OU -no eye gtts -no eye surgeries -no fhx eye disease -no floaters or FOL       HISTORY OF PRESENT ILLNESS: Julie Nicholson is a 58 y.o. female who present to the clinic today for:  HPI     Consult    Additional comments: Referred by Suzen Lager, NP for eye exam -last eye exam 4 years, [facility] -pt reports decreased vision both eyes and frequent headaches, has stage 4 lung cancer + intermittent watering, burning and itching OU -no eye gtts -no eye surgeries -no fhx eye disease -no floaters or FOL       Last edited by April Dela Stager, COA on 11/24/2023  1:50 PM.      HISTORICAL INFORMATION:  CURRENT MEDICATIONS: Medications Ordered Prior to Encounter[1]  Referring physician: No referring provider defined for this encounter.  REVIEW OF SYSTEMS ROS   Positive for: Eyes Last edited by April Biggs Boone, COA on 11/24/2023  1:50 PM.      ALLERGIES Allergies[2]  PAST MEDICAL HISTORY Medical History[3]  PAST SURGICAL HISTORY Surgical History[4]  FAMILY HISTORY Family History[5]  SOCIAL HISTORY Social History[6]  OPHTHALMIC EXAM Base Eye Exam     Visual Acuity (Snellen - Linear)       Right Left   Dist cc 20/30 -2 20/30 -2    Correction: Glasses  Blurred OU        Tonometry ([facility], 1:53 PM)       Right Left   Pressure 16 18         Pupils       Pupils   Right PERRL   Left PERRL         Visual Fields       Left Right    Full Full         Extraocular Movement       Right Left    Full, Ortho Full, Ortho         Neuro/Psych     Oriented x3: Yes   Mood/Affect: Normal          Dilation     Both eyes: 1.0% Tropicamide @ 1:53 PM  Request least drops          Slit Lamp and Fundus Exam     External Exam       Right Left   External Normal Normal         Slit Lamp Exam       Right Left   Lids/Lashes Normal Normal   Conjunctiva/Sclera White and quiet White and quiet   Cornea Clear Clear   Anterior Chamber Deep and quiet Deep and quiet   Iris Round and reactive Round and reactive   Lens 2-3+ Nuclear sclerosis, Trace Cortical cataract 2-3+ Nuclear sclerosis, Trace Cortical cataract   Anterior Vitreous Normal Normal         Fundus Exam       Right Left   Disc Normal Normal   C/D Ratio 0.3 0.3   Macula Normal Normal   Vessels Normal Normal   Periphery Normal Normal           Refraction  Wearing Rx       Sphere Cylinder Axis Add   Right +1.50 +0.50 130 +2.50   Left +1.25 +0.75 050 +2.50    Age: 24yrs   Type: PAL         Manifest Refraction (Subjective)       Sphere Cylinder Axis Dist VA   Right +1.75 +0.50 065 20/20   Left +1.50 +0.50 100 20/20-3         Final Rx       Sphere Cylinder Axis Dist VA Add   Right +1.75 +0.50 065 20/20 +2.50   Left +1.50 +0.50 100 20/20-3 +2.50    Expiration Date: 11/23/2024             IMAGING AND PROCEDURES  Imaging and Procedures for 11/24/2023:    Prior Imaging and Procedures:    ASSESSMENT/PLAN:  1. 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 Educated as cataracts progress can cause difficulty with night vision and halos/glare around lights. Educated if notices changes in vision to return to clinic. Monitor x 1 year   2. 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     Return in about 1 year (around  11/23/2024).  There are no Patient Instructions on file for this visit.   Explained the diagnoses, plan, and follow up with the patient and they expressed understanding.  Patient expressed understanding of the importance of proper follow up care.    Abbreviations: M myopia (nearsighted); A astigmatism; H hyperopia (farsighted); P presbyopia; Mrx spectacle prescription;  CTL contact lenses; OD right eye; OS left eye; OU both eyes  XT exotropia; ET esotropia; PEK punctate epithelial keratitis; PEE punctate epithelial erosions; DES dry eye syndrome; MGD meibomian gland dysfunction; ATs artificial tears; PFAT's preservative free artificial tears; NSC nuclear sclerotic cataract; PSC posterior subcapsular cataract; ERM epi-retinal membrane; PVD posterior vitreous detachment; RD retinal detachment; DM diabetes mellitus; DR diabetic retinopathy; NPDR non-proliferative diabetic retinopathy; PDR proliferative diabetic retinopathy; CSME clinically significant macular edema; DME diabetic macular edema; dbh dot blot hemorrhages; CWS cotton wool spot; POAG primary open angle glaucoma; C/D cup-to-disc ratio; HVF humphrey visual field; GVF goldmann visual field; OCT optical coherence tomography; IOP intraocular pressure; BRVO Branch retinal vein occlusion; CRVO central retinal vein occlusion; CRAO central retinal artery occlusion; BRAO branch retinal artery occlusion; RT retinal tear; SB scleral buckle; PPV pars plana vitrectomy; VH Vitreous hemorrhage; PRP panretinal laser photocoagulation; IVK intravitreal kenalog; VMT vitreomacular traction; MH Macular hole;  NVD neovascularization of the disc; NVE neovascularization elsewhere; AREDS age related eye disease study; ARMD age related macular degeneration; POAG primary open angle glaucoma; EBMD epithelial/anterior basement membrane dystrophy; ACIOL anterior chamber intraocular lens; IOL intraocular lens; PCIOL posterior chamber intraocular lens; Phaco/IOL  phacoemulsification with intraocular lens placement; PRK photorefractive keratectomy; LASIK laser assisted in situ keratomileusis; HTN hypertension; DM diabetes mellitus; COPD chronic obstructive pulmonary disease      [1] Current Outpatient Medications on File Prior to Visit  Medication Sig Dispense Refill  . acetaminophen  (TYLENOL ) 500 mg tablet Take 1,000 mg by mouth.    . albuterol  2.5 mg /3 mL (0.083 %) nebulizer solution albuterol  sulfate 2.5 mg/3 mL (0.083 %) solution for nebulization  USE 1 VIAL IN NEBULIZER EVERY 6 HOURS AS NEEDED FOR WHEEZING OR  SHORTNESS OF BREATH    . albuterol  HFA (PROVENTIL  HFA;VENTOLIN  HFA;PROAIR  HFA) 90 mcg/actuation inhaler Inhale 1-2 puffs.    . aspirin  81 mg EC tablet Take 81 mg by mouth nightly.    . atorvastatin  (LIPITOR) 40 mg tablet atorvastatin  40 mg tablet TAKE 1 TABLET BY MOUTH EVERY DAY    . benzonatate  (TESSALON ) 200 mg capsule Take 200 mg by mouth.    . biotin (MERIBIN) 5 mg tab tablet Take 5,000 mcg by mouth.    . buPROPion  (WELLBUTRIN  XL) 150 mg 24 hr tablet Take 150 mg by mouth daily.    . ciprofloxacin -dexAMETHasone  (CIPRODEX ) 0.3-0.1 % otic suspension Instill 4 drops into right ear twice daily for 7 days. 7.5 mL 0  . clotrimazole (LOTRIMIN) 1 % cream APPLY 1 APPLICATOR OF CREAM 2 TIMES PER DAY    . famotidine  (PEPCID ) 20 mg tablet Take 1 tablet by mouth 2 (two) times a day.    . fluticasone  propionate (FLONASE ) 50 mcg/spray nasal spray Administer 2 sprays into affected nostril(s).    . folic acid  (FOLVITE ) 1 mg tablet Take 1,000 mcg by mouth daily.    . lidocaine -prilocaine  (EMLA ) 2.5-2.5 % cream APPLY TO PORT-A-CATH 30 TO 60 MINUTES BEFORE TREATMENT    . loratadine  (CLARITIN ) 10 mg tablet Take 10 mg by mouth daily.    . ondansetron  (ZOFRAN -ODT) 8 mg disintegrating tablet Dissolve 8 mg on tongue.    . oxyCODONE  (ROXICODONE ) 5 mg immediate release tablet oxycodone  5 mg tablet TAKE 1 TABLET PO 3 TIMES A DAY    . PARoxetine (PAXIL) 10 mg  tablet Take 10 mg by mouth daily.    . prochlorperazine  (COMPAZINE ) 10 mg tablet Take 10 mg by mouth.    . rizatriptan MLT (MAXALT-MLT) 10 mg disintegrating tablet Dissolve 10 mg on tongue.    . sertraline  (ZOLOFT ) 50 mg tablet Take 50 mg by mouth daily.    . Stiolto Respimat  2.5-2.5 mcg/actuation inhaler INHALE 2 PUFFS BY MOUTH INTO THE LUNGS DAILY     No current facility-administered medications on file prior to visit.  [2] Allergies Allergen Reactions  . Carboplatin  Cough, Itching and Shortness Of Breath    See progress note from 06/16/22  [3] Past Medical History: Diagnosis Date  . Headache   . Lung cancer    (CMD)   [4] Past Surgical History: Procedure Laterality Date  . ADENOIDECTOMY     Procedure: ADENOIDECTOMY  . TONSILLECTOMY     Procedure: TONSILLECTOMY  . TYMPANOSTOMY TUBE PLACEMENT     Procedure: TYMPANOSTOMY TUBE PLACEMENT  [5] Family History Problem Relation Name Age of Onset  . Diabetes Mother    . Diabetes Father    . Stroke Father    [6] Social History Tobacco Use  . Smoking status: Former    Types: Cigarettes  . Smokeless tobacco: Never  Vaping Use  . Vaping status: Never Used  Substance Use Topics  . Alcohol use: Not Currently  . Drug use: Never

## 2023-12-02 ENCOUNTER — Other Ambulatory Visit: Payer: Self-pay | Admitting: Internal Medicine

## 2023-12-02 ENCOUNTER — Other Ambulatory Visit: Payer: Self-pay | Admitting: Emergency Medicine

## 2023-12-02 DIAGNOSIS — D61810 Antineoplastic chemotherapy induced pancytopenia: Secondary | ICD-10-CM

## 2023-12-02 DIAGNOSIS — J3089 Other allergic rhinitis: Secondary | ICD-10-CM

## 2023-12-02 NOTE — Progress Notes (Signed)
 [facility] OFFICE PROGRESS NOTE  Julie Nicholson, Ringel, NP [address] [place] [address]  DIAGNOSIS: Recurrent/metastatic non-small cell lung cancer initially diagnosed as stage IIIB  (T1b, N3, M0) non-small cell lung cancer, adenocarcinoma she presented with right upper lobe nodule in addition to bulky right hilar, mediastinal, and left supraclavicular lymphadenopathy diagnosed in November 2022.  The patient had evidence for disease recurrence in the mediastinal and right supraclavicular lymphadenopathy in August 2023.  The patient had evidence of metastatic disease in February 2024 with several supraclavicular, thoracic and precarinal lymphadenopathy as well as upper abdominal lymph nodes and small liver lesion.   DETECTED ALTERATION(S) / BIOMARKER(S)% CFDNA OR AMPLIFICATIONASSOCIATED FDA-APPROVED THERAPIESCLINICAL TRIAL AVAILABILITY [id number] ND 0.5 5 50 100 4.7%   RHOAG17E ND 0.5 5 50 100 1.8%   CTNNB1S37C ND 0.5 5 50 100 1.9%   BIOMARKERADDITIONAL DETAILS Tumor Mutational Burden (TMB) 19.02 mut/Mb MSI Status Stable (MSS) PD-L1 Tumor Proportion Score (TPS)* <1%   Biomarker Findings By [facility] on 05/06/2022 Tumor Mutational Burden - 17 Muts/Mb Microsatellite status - MS-Stable Genomic Findings For a complete list of the genes assayed, please refer to the Appendix. CTNNB1 S37C CRKL amplification MAP2K4 loss exons 2-11 TP53 V143A 8 Disease relevant genes with no reportable alterations: ALK, BRAF, EGFR, ERBB2, KRAS, MET, RET, ROS1   PDL1 0%  PRIOR THERAPY: 1) Concurrent chemoradiation with carboplatin  for an AUC of 2 and paclitaxel  45 mg per metered squared.  First dose on 01/05/2021.  Status post 7 cycles of treatment.  Last dose was given February 16, 2021. 2) Consolidation immunotherapy with Imfinzi  1500 Mg IV every 4 weeks.  First dose March 25, 2021.  Status post 3 cycles.  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 3) Palliative radiotherapy to the enlarging right supraclavicular lymphadenopathy under the care of Dr. Shannon expected to be completed on October 28, 2021 4) Radiation to the enlarging left supraclavicular lymph node under the care of Dr. Shannon. Last dose expected on 01/07/23   CURRENT THERAPY: Palliative systemic chemotherapy with carboplatin  for AUC of 5, Alimta  500 Mg/M2 and Keytruda  200 Mg IV every 3 weeks.  First dose April 05, 2022.  Status post 26 cycles.  Starting from cycle #3 her dose of carboplatin  was reduced to AUC of 4 and Alimta  400 Mg/M2.  Starting from cycle #5 the patient will be on maintenance treatment with Alimta  and Keytruda  every 3 weeks.   INTERVAL HISTORY: Julie Nicholson 58 y.o. female returns to the clinic today for a follow-up visit.  The patient was last seen in the clinic on 11/16/2023 by Dr. Sherrod.  She is currently undergoing chemotherapy and immunotherapy.  She is feeling fine physically today. She did lose her mom last month which is difficult for her as she was close to her mother.   Today she denies any fever or chills. She has night sweats at baseline which is unchanged. She gained a few pounds. She is trying to gain weight. She drinks ensure 1x per day.   She denies any changes or concerns with her breathing. She has intermittent shortness of breath at baseline but she states she does not have anything she is concerned about. She denies changes with cough. She denies hemoptysis or chest pain.   She experiences nausea and uses ODT Zofran  as needed for relief. No vomiting or diarrhea is reported. She needs a refill of her zofran .   She denies rashes. She was hospitalized for cellulitis  in September 2025. She tells me she also had joint effusion at that time which is improved. She states she is prone to headaches and denies changes in severity or frequency of headaches.    She is here today for evaluation and repeat blood work before  undergoing the next cycle of treatment.  MEDICAL HISTORY: Past Medical History:  Diagnosis Date   Abdominal discomfort    Acute sinusitis 11/11/2021   Adenocarcinoma of right lung, stage 4 (HCC) 12/18/2020   Allergic rhinitis 05/08/2021   Antineoplastic chemotherapy induced pancytopenia 02/23/2021   Aortic atherosclerosis 06/24/2023   Cancer (HCC)    Chronic cholecystitis 03/13/2013   Chronic headaches    due to allergies, sinus   Chronic venous hypertension (idiopathic) with other complications of bilateral lower extremity    Constipation 06/23/2023   COPD (chronic obstructive pulmonary disease) (HCC)    per 2012 chest xray   pt states she doesn not have this now (04/10/2013)   COVID-19 virus infection 02/24/2021   Deviated nasal septum    Dyspnea 04/07/2021   Encounter for antineoplastic chemotherapy 12/18/2020   Encounter for antineoplastic immunotherapy 11/17/2022   Eustachian tube dysfunction    Former smoker 07/20/2023   GERD (gastroesophageal reflux disease)    occasional uses Tums / Rolaids   Goals of care, counseling/discussion 03/18/2021   Hearing loss    right ear   HFrEF (heart failure with reduced ejection fraction) (HCC) 07/13/2023   High cholesterol    History of radiation therapy    right lung 01/07/2021-02/19/2021  Dr Lynwood Nasuti   History of radiation therapy    Lymph nodes of head,face,neck- 12/23/22-01/07/23- Dr. 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  MEDICATIONS:  Current Outpatient Medications  Medication Sig Dispense Refill   acetaminophen  (TYLENOL ) 500 MG tablet Take 500 mg by mouth every 6 (six) hours as needed for moderate pain (pain score 4-6).     albuterol  (PROVENTIL ) (2.5 MG/3ML) 0.083% nebulizer solution Take 3 mLs (2.5 mg total) by nebulization every 6 (six) hours as needed. 120 mL 5   albuterol  (VENTOLIN  HFA) 108 (90 Base) MCG/ACT inhaler Inhale 1-2 puffs into the lungs every 6 (six) hours as needed for wheezing or shortness of breath. 18 g 3   aspirin  EC 81 MG tablet Take 81 mg by mouth at bedtime.     atorvastatin  (LIPITOR) 40 MG tablet Take 40 mg by mouth at bedtime.     azelastine  (ASTELIN ) 0.1 % nasal spray Place 1 spray into both nostrils 2 (two) times daily as needed. Use in each nostril as directed 30 mL 5   benzonatate  (TESSALON ) 200 MG capsule Take 1 capsule (200 mg total) by mouth 3 (three) times daily as needed. 30 capsule 0  Biotin 5000 MCG TABS Take 10,000 mcg by mouth at bedtime.     buPROPion  (WELLBUTRIN  XL) 150 MG 24 hr tablet Take 150 mg by mouth at bedtime.     Cyanocobalamin  (VITAMIN B-12) 5000 MCG TBDP Take 5,000 mcg by mouth at bedtime.     docusate sodium  (COLACE) 100 MG capsule Take 200 mg by mouth at bedtime.     famotidine  (PEPCID ) 20 MG tablet Take 1 tablet by mouth twice daily (Patient taking differently: Take 20 mg by mouth at bedtime.) 60 tablet 0   fluticasone  (FLONASE ) 50 MCG/ACT nasal spray Place 2 sprays into both nostrils daily. (Patient taking differently: Place 2 sprays into both nostrils daily as needed for allergies.) 16 g 5   folic acid  (FOLVITE ) 1 MG tablet Take 1 tablet by mouth once daily 30 tablet 0   furosemide  (LASIX ) 20 MG tablet Take 20 mg by mouth as needed for fluid or  edema. (Patient not taking: Reported on 10/29/2023)     guaiFENesin  (MUCINEX ) 600 MG 12 hr tablet Take 600 mg by mouth at bedtime.     hydrocortisone  2.5 % ointment APPLY OINTMENT EXTERNALLY TWICE DAILY TO THE LOWER LEGS FOR UP TO 2 WEEKS MAXIMUM External; Duration: 30 Days     levothyroxine  (SYNTHROID ) 25 MCG tablet TAKE 1 TABLET BY MOUTH ONCE DAILY BEFORE BREAKFAST 30 tablet 0   lidocaine -prilocaine  (EMLA ) cream APPLY TO PORT-A-CATH 30 TO 60 MINUTES BEFORE TREATMENT 30 g 0   linaCLOtide (LINZESS PO) Take 1 tablet by mouth daily.     loratadine  (CLARITIN ) 10 MG tablet Take 1 tablet by mouth once daily 30 tablet 0   Multiple Vitamins-Minerals (HAIR SKIN AND NAILS FORMULA PO) Take 1 tablet by mouth daily.     Omega-3 Fatty Acids ([employer] NATURAL FISH OIL) 1000 MG CAPS Take 2,000 mg by mouth at bedtime.     ondansetron  (ZOFRAN -ODT) 8 MG disintegrating tablet Take 1 tablet (8 mg total) by mouth every 8 (eight) hours as needed for nausea or vomiting. 30 tablet 2   Oxycodone  HCl 10 MG TABS Take 10 mg by mouth 5 (five) times daily.     polyethylene glycol powder (MIRALAX ) 17 GM/SCOOP powder Take 17 g by mouth 2 (two) times daily as needed for mild constipation. 255 g 1   prochlorperazine  (COMPAZINE ) 10 MG tablet Take 1 tablet (10 mg total) by mouth every 6 (six) hours as needed. (Patient not taking: Reported on 10/29/2023) 30 tablet 2   rizatriptan (MAXALT-MLT) 10 MG disintegrating tablet Take 10 mg by mouth as needed for migraine. May repeat in 2 hours if needed     sertraline  (ZOLOFT ) 50 MG tablet Take 1 tablet by mouth daily.     Tiotropium Bromide-Olodaterol (STIOLTO RESPIMAT ) 2.5-2.5 MCG/ACT AERS Inhale 2 puffs into the lungs daily. 12 g 3   triamcinolone cream (KENALOG) 0.1 % Apply 1 Application topically daily as needed (for nausea).     No current facility-administered medications for this visit.   Facility-Administered Medications Ordered in Other Visits  Medication Dose Route Frequency Provider  Last Rate Last Admin   0.9 %  sodium chloride  infusion   Intravenous Once Mohamed, Mohamed, MD       cyanocobalamin  (VITAMIN B12) injection 1,000 mcg  1,000 mcg Intramuscular Once Mohamed, Mohamed, MD       ondansetron  (ZOFRAN ) injection 8 mg  8 mg Intravenous Once Mohamed, Mohamed, MD       pembrolizumab  (KEYTRUDA ) 200 mg in sodium chloride  0.9 % 50 mL  chemo infusion  200 mg Intravenous Once Julie Nicholson Sherrod, MD       PEMEtrexed  Disodium (ALIMTA ) 600 mg in sodium chloride  0.9 % 100 mL chemo infusion  400 mg/m2 (Treatment Plan Recorded) Intravenous Once Julie Nicholson Sherrod, MD        SURGICAL HISTORY:  Past Surgical History:  Procedure Laterality Date   ABDOMINAL HYSTERECTOMY  1995   tx endometriosis, both ovaries removed   ADENOIDECTOMY     APPENDECTOMY  late 1990's   BIOPSY  02/26/2021   Procedure: BIOPSY;  Surgeon: Teressa Toribio SQUIBB, MD;  Location: [facility] ENDOSCOPY;  Service: Endoscopy;;   BRONCHIAL BRUSHINGS  12/15/2020   Procedure: BRONCHIAL BRUSHINGS;  Surgeon: Shelah Lamar RAMAN, MD;  Location: [facility] ENDOSCOPY;  Service: Cardiopulmonary;;   BRONCHIAL NEEDLE ASPIRATION BIOPSY  12/15/2020   Procedure: BRONCHIAL NEEDLE ASPIRATION BIOPSIES;  Surgeon: Shelah Lamar RAMAN, MD;  Location: [facility] ENDOSCOPY;  Service: Cardiopulmonary;;   CHOLECYSTECTOMY  04/10/2013   CHOLECYSTECTOMY N/A 04/10/2013   Procedure: LAPAROSCOPIC CHOLECYSTECTOMY WITH INTRAOPERATIVE CHOLANGIOGRAM;  Surgeon: Krystal JINNY Russell, MD;  Location: [facility] OR;  Service: General;  Laterality: N/A;   ELECTROMAGNETIC NAVIGATION BROCHOSCOPY  12/15/2020   Procedure: ELECTROMAGNETIC NAVIGATION BRONCHOSCOPY;  Surgeon: Shelah Lamar RAMAN, MD;  Location: [facility] ENDOSCOPY;  Service: Cardiopulmonary;;   ESOPHAGOGASTRODUODENOSCOPY (EGD) WITH PROPOFOL  N/A 02/26/2021   Procedure: ESOPHAGOGASTRODUODENOSCOPY (EGD) WITH PROPOFOL ;  Surgeon: Teressa Toribio SQUIBB, MD;  Location: [facility] ENDOSCOPY;  Service: Endoscopy;  Laterality: N/A;   EUS N/A 02/16/2013   Procedure: UPPER  ENDOSCOPIC ULTRASOUND (EUS) LINEAR;  Surgeon: Toribio SQUIBB Teressa, MD;  Location: [facility] ENDOSCOPY;  Service: Endoscopy;  Laterality: N/A;  radial linear   IR IMAGING GUIDED PORT INSERTION  06/30/2022   KNEE ARTHROSCOPY Right 1980's   cartilage OR   LAPAROSCOPIC ENDOMETRIOSIS FULGURATION  1980's   MYRINGOTOMY WITH TUBE PLACEMENT Right 07/13/2018   Procedure: MYRINGOTOMY WITH TUBE PLACEMENT;  Surgeon: Edda Mt, MD;  Location: [facility] SUR[facility]ERY CNTR;  Service: ENT;  Laterality: Right;   MYRINGOTOMY WITH TUBE PLACEMENT Right 06/04/2021   Procedure: MYRINGOTOMY WITH BUTTERFLY TUBE PLACEMENT;  Surgeon: Edda Mt, MD;  Location: [facility] SURGERY CNTR;  Service: ENT;  Laterality: Right;   NASOPHARYNGOSCOPY EUSTATION TUBE BALLOON DILATION Right 07/13/2018   Procedure: NASOPHARYNGOSCOPY EUSTATION TUBE BALLOON DILATION;  Surgeon: Edda Mt, MD;  Location: [facility] SURGERY CNTR;  Service: ENT;  Laterality: Right;   TONSILLECTOMY AND ADENOIDECTOMY  ~ 1980   adenoidectomy   TUBAL LIGATION  ~ 1987   TURBINATE REDUCTION Right 07/13/2018   Procedure: OUTFRACTURE TURBINATE;  Surgeon: Edda Mt, MD;  Location: [facility] SURGERY CNTR;  Service: ENT;  Laterality: Right;   TYMPANOSTOMY TUBE PLACEMENT     VIDEO BRONCHOSCOPY WITH ENDOBRONCHIAL ULTRASOUND N/A 12/15/2020   Procedure: ROBOTIC VIDEO BRONCHOSCOPY WITH ENDOBRONCHIAL ULTRASOUND;  Surgeon: Shelah Lamar RAMAN, MD;  Location: [facility] ENDOSCOPY;  Service: Cardiopulmonary;  Laterality: N/A;   WRIST SURGERY Left    w/plate    REVIEW OF SYSTEMS:   Constitutional: Positive for fatigue. Positive for weight gain. Negative for chills and fever.  HENT: Negative for mouth sores, nosebleeds, sore throat and trouble swallowing.   Eyes: Negative for eye problems and icterus.  Respiratory: Positive for intermittent cough and shortness of breath. Negative for hemoptysis and wheezing.   Cardiovascular: Negative for chest pain and leg swelling.  Gastrointestinal: Positive for  intermittent nausea. Negative for abdominal pain, constipation, diarrhea, nausea and vomiting.  Genitourinary: Negative for bladder incontinence, difficulty urinating, dysuria, frequency and hematuria.   Musculoskeletal: Negative for gait problem, neck  pain and neck stiffness.  Skin: Negative for itching and rash.  Neurological: Positive for intermittent headaches. Negative for dizziness, extremity weakness, gait problem, light-headedness and seizures.  Hematological: Negative for adenopathy. Does not bruise/bleed easily.  Psychiatric/Behavioral: Negative for confusion, depression and sleep disturbance. The patient is not nervous/anxious.     PHYSICAL EXAMINATION:  Blood pressure 118/67, pulse (!) 108, temperature 97.7 F (36.5 C), temperature source Tympanic, resp. rate 18, height 5' 1 (1.549 m), weight 99 lb (44.9 kg), SpO2 99%.  ECOG PERFORMANCE STATUS: 1  Physical Exam  Constitutional: Oriented to person, place, and time and well-developed, well-nourished, and in no distress.  HENT:  Head: Normocephalic and atraumatic.  Mouth/Throat: Oropharynx is clear and moist. No oropharyngeal exudate.  Eyes: Conjunctivae are normal. Right eye exhibits no discharge. Left eye exhibits no discharge. No scleral icterus.  Neck: Normal range of motion. Neck supple.  Cardiovascular: Normal rate, regular rhythm, normal heart sounds and intact distal pulses.   Pulmonary/Chest: Effort normal and breath sounds normal. No respiratory distress. No wheezes. No rales.  Abdominal: Soft. Bowel sounds are normal. Exhibits no distension and no mass. There is no tenderness.  Musculoskeletal: Normal range of motion. Exhibits no edema.  Lymphadenopathy:    No cervical adenopathy.  Neurological: Alert and oriented to person, place, and time. Exhibits normal muscle tone. Gait normal. Coordination normal.  Skin: Skin is warm and dry. No rash noted. Not diaphoretic. No erythema. No pallor.  Psychiatric: Mood, memory  and judgment normal.  Vitals reviewed.  LABORATORY DATA: Lab Results  Component Value Date   WBC 3.0 (L) 12/07/2023   HGB 10.6 (L) 12/07/2023   HCT 31.6 (L) 12/07/2023   MCV 102.6 (H) 12/07/2023   PLT 202 12/07/2023      Chemistry      Component Value Date/Time   NA 141 12/07/2023 1105   K 3.5 12/07/2023 1105   CL 105 12/07/2023 1105   CO2 29 12/07/2023 1105   BUN 10 12/07/2023 1105   CREATININE 1.03 (H) 12/07/2023 1105      Component Value Date/Time   CALCIUM  9.4 12/07/2023 1105   ALKPHOS 121 12/07/2023 1105   AST 23 12/07/2023 1105   ALT 14 12/07/2023 1105   BILITOT 0.4 12/07/2023 1105       RADIOGRAPHIC STUDIES:  CT CHEST ABDOMEN PELVIS W CONTRAST Result Date: 11/13/2023 CLINICAL DATA:  Non-small-cell lung cancer, staging. * Tracking Code: BO * EXAM: CT CHEST, ABDOMEN, AND PELVIS WITH CONTRAST TECHNIQUE: Multidetector CT imaging of the chest, abdomen and pelvis was performed following the standard protocol during bolus administration of intravenous contrast. RADIATION DOSE REDUCTION: This exam was performed according to the departmental dose-optimization program which includes automated exposure control, adjustment of the mA and/or kV according to patient size and/or use of iterative reconstruction technique. CONTRAST:  80mL OMNIPAQUE  IOHEXOL  300 MG/ML  SOLN COMPARISON:  Multiple priors including CT August 18, 2023 Jun 23, 2023 FINDINGS: CT CHEST FINDINGS Cardiovascular: Right chest Port-A-Cath with tip near the superior cavoatrial junction. Normal caliber thoracic aorta. Normal size heart. No significant pericardial effusion/thickening. Mediastinum/Nodes: No suspicious thyroid  nodule. Similar soft tissue thickening along the right side of the mediastinum and hilum. No pathologically enlarged mediastinal or hilar lymph nodes. Diffuse symmetric esophageal wall thickening unchanged. Lungs/Pleura: Similar masslike consolidation in the perihilar/suprahilar right lung with radiating  bandlike opacities extending to the pleural surface architectural distortion and bronchiectasis/bronchiolectasis. No new suspicious nodularity in the treatment bed. Significant improvement in the opacities in the right lower  lobe seen on prior examination now with some residual interstitial thickening and minimal ground-glass compatible with a resolving infectious/inflammatory process. Left lung is without suspicious pulmonary nodule or mass. Emphysema. Musculoskeletal: No aggressive lytic or blastic lesion of bone. CT ABDOMEN PELVIS FINDINGS Hepatobiliary: No suspicious hepatic lesion. Gallbladder surgically absent. Similar prominence of the biliary tree with minimal tapering of the common duct into the ampulla. Pancreas: Similar prominence of the proximal pancreatic duct. No pancreatic ductal dilation. Spleen: No splenomegaly. Adrenals/Urinary Tract: No suspicious adrenal nodule/mass. No hydronephrosis. Kidneys demonstrate symmetric enhancement. Urinary bladder is unremarkable for degree of distension. Stomach/Bowel: Stomach is unremarkable for degree of distension. No pathologic dilation of small or large bowel. Large volume of formed stool in the colon. Vascular/Lymphatic: Normal caliber abdominal aorta. Aortic atherosclerosis. Smooth IVC contours. Increased size of the left periaortic retroperitoneal soft tissue nodule which is in continuity with vessels now measuring 13 x 7 mm on image 67/2 previously 9 x 5 mm. Reproductive: Status post hysterectomy. No adnexal masses. Other: No significant abdominopelvic free fluid. Musculoskeletal: No aggressive lytic or blastic lesion of bone. IMPRESSION: 1. Similar masslike consolidation in the perihilar/suprahilar right lung with radiating bandlike opacities extending to the pleural surface architectural distortion and bronchiectasis/bronchiolectasis, compatible with postradiation change. No new suspicious nodularity in the treatment bed. 2. Significant improvement in the  opacities in the right lower lobe seen on prior examination now with some residual interstitial thickening and minimal ground-glass compatible with a resolving infectious/inflammatory process. 3. Increased size of the left periaortic retroperitoneal soft tissue nodule which is in continuity with vessels now measuring 13 x 7 mm previously 9 x 5 mm. This is nonspecific and may reflect a lymph node or venous varix. Suggest attention on follow-up imaging. 4. Large volume of formed stool in the colon. 5. Aortic Atherosclerosis (ICD10-I70.0) and Emphysema (ICD10-J43.9). Electronically Signed   By: Reyes Holder M.D.   On: 11/13/2023 05:49     ASSESSMENT/PLAN:  This is a very pleasant 58 year old Caucasian female with recurrent lung cancer initially diagnosed as stage IIIB (T1b, N3, M0) non-small cell lung cancer, adenocarcinoma.  She presented with a right upper lobe spiculated nodule and right hilar, mediastinal, and left supraclavicular adenopathy.  She was diagnosed in November 2022.   Therefore, the patient had guardant 360 performed which showed no actionable mutations.  Her PD-L1 expression is less than 1%.  She was found to have metastatic disease in February 2024    The patient completed a course of concurrent chemoradiation with carboplatin  for an AUC of 2 and paclitaxel  45 mg per metered square.  She status post 7 cycles.  She had some significant odynophagia and dysphagia from treatment.   She was seen by Dr. Teressa while admitted to the hospital and a EGD was performed which showed single 1cm oval shaped very shallow ulceration.  She recovered.    She then had consolidation immunotherapy with Imfinzi  1500 mg IV every 4 weeks. She is status post 3 cycles. She developed suspicious immunotherapy mediated pneumonitis and this was ultimately discontinued.    The patient was seen by Dr. Shannon and started palliative radiotherapy to the enlarging right supraclavicular lymphadenopathy. This was completed  on October 28, 2021.    The patient was found to have metastatic disease in February 2024 with several lymphadenopathy in the chest as well as supraclavicular, upper abdomen as well as suspicious small liver metastasis. The patient also had ultrasound-guided core biopsy of a left supraclavicular lymph node yesterday and the final  pathology was consistent with metastatic moderate to poorly differentiated adenocarcinoma of the lung primary.   She is currently undergoing systemic chemotherapy with carboplatin  for AUC of 5, Alimta  500 Mg/M2 and Keytruda  200 Mg IV every 3 weeks status post 26 cycles.  Starting from cycle #3, Dr. Sherrod reduced her dose of carboplatin  to AUC of 4 and Alimta  400 Mg/M2. Starting from cycle #5 the patient started on maintenance treatment with Alimta  and Keytruda  every 3 weeks.    Labs were reviewed. Recommend that she proceed with cycle #27 today scheduled.  She is ok to treat with her pulse of 108 bpm today.    We will see her back for labs and a follow-up visit in 3 weeks for evaluation and repeat blood work before undergoing cycle #27. She is scheduled to see my colleague that day. I have went ahead and ordered her next restaging CT scan today which will be due in mid December so we can monitor the enlarging lymph node from her last scan closely.     The patient was advised to call immediately if she has any concerning symptoms in the interval. The patient voices understanding of current disease status and treatment options and is in agreement with the current care plan. All questions were answered. The patient knows to call the clinic with any problems, questions or concerns. We can certainly see the patient much sooner if necessary      Orders Placed This Encounter  Procedures   CT CHEST ABDOMEN PELVIS W CONTRAST    Standing Status:   Future    Expected Date:   01/11/2024    Expiration Date:   12/06/2024    If indicated for the ordered procedure, I authorize  the administration of contrast media per Radiology protocol:   Yes    Does the patient have a contrast media/X-ray dye allergy ?:   No    Preferred imaging location?:   [facility]    If indicated for the ordered procedure, I authorize the administration of oral contrast media per Radiology protocol:   Yes     The total time spent in the appointment was 20-29 minutes  Bernis Stecher L Axelle Szwed, PA-C 12/07/23

## 2023-12-05 ENCOUNTER — Encounter: Payer: Self-pay | Admitting: Internal Medicine

## 2023-12-05 ENCOUNTER — Encounter: Payer: Self-pay | Admitting: Physician Assistant

## 2023-12-07 ENCOUNTER — Inpatient Hospital Stay: Attending: Internal Medicine

## 2023-12-07 ENCOUNTER — Inpatient Hospital Stay (HOSPITAL_BASED_OUTPATIENT_CLINIC_OR_DEPARTMENT_OTHER): Admitting: Physician Assistant

## 2023-12-07 ENCOUNTER — Inpatient Hospital Stay

## 2023-12-07 ENCOUNTER — Other Ambulatory Visit: Payer: Self-pay | Admitting: Physician Assistant

## 2023-12-07 ENCOUNTER — Inpatient Hospital Stay: Admitting: Dietician

## 2023-12-07 VITALS — BP 118/67 | HR 108 | Temp 97.7°F | Resp 18 | Ht 61.0 in | Wt 99.0 lb

## 2023-12-07 DIAGNOSIS — C3411 Malignant neoplasm of upper lobe, right bronchus or lung: Secondary | ICD-10-CM | POA: Insufficient documentation

## 2023-12-07 DIAGNOSIS — R110 Nausea: Secondary | ICD-10-CM | POA: Diagnosis not present

## 2023-12-07 DIAGNOSIS — Z79620 Long term (current) use of immunosuppressive biologic: Secondary | ICD-10-CM | POA: Insufficient documentation

## 2023-12-07 DIAGNOSIS — C3491 Malignant neoplasm of unspecified part of right bronchus or lung: Secondary | ICD-10-CM

## 2023-12-07 DIAGNOSIS — C770 Secondary and unspecified malignant neoplasm of lymph nodes of head, face and neck: Secondary | ICD-10-CM | POA: Diagnosis not present

## 2023-12-07 DIAGNOSIS — C787 Secondary malignant neoplasm of liver and intrahepatic bile duct: Secondary | ICD-10-CM | POA: Insufficient documentation

## 2023-12-07 DIAGNOSIS — Z5111 Encounter for antineoplastic chemotherapy: Secondary | ICD-10-CM

## 2023-12-07 DIAGNOSIS — R61 Generalized hyperhidrosis: Secondary | ICD-10-CM | POA: Insufficient documentation

## 2023-12-07 DIAGNOSIS — T50995D Adverse effect of other drugs, medicaments and biological substances, subsequent encounter: Secondary | ICD-10-CM | POA: Insufficient documentation

## 2023-12-07 DIAGNOSIS — D61810 Antineoplastic chemotherapy induced pancytopenia: Secondary | ICD-10-CM | POA: Diagnosis not present

## 2023-12-07 DIAGNOSIS — C3490 Malignant neoplasm of unspecified part of unspecified bronchus or lung: Secondary | ICD-10-CM | POA: Diagnosis not present

## 2023-12-07 DIAGNOSIS — R519 Headache, unspecified: Secondary | ICD-10-CM

## 2023-12-07 DIAGNOSIS — Z5112 Encounter for antineoplastic immunotherapy: Secondary | ICD-10-CM | POA: Diagnosis present

## 2023-12-07 LAB — CBC WITH DIFFERENTIAL (CANCER CENTER ONLY)
Abs Immature Granulocytes: 0 K/uL (ref 0.00–0.07)
Basophils Absolute: 0 K/uL (ref 0.0–0.1)
Basophils Relative: 0 %
Eosinophils Absolute: 0 K/uL (ref 0.0–0.5)
Eosinophils Relative: 1 %
HCT: 31.6 % — ABNORMAL LOW (ref 36.0–46.0)
Hemoglobin: 10.6 g/dL — ABNORMAL LOW (ref 12.0–15.0)
Immature Granulocytes: 0 %
Lymphocytes Relative: 21 %
Lymphs Abs: 0.6 K/uL — ABNORMAL LOW (ref 0.7–4.0)
MCH: 34.4 pg — ABNORMAL HIGH (ref 26.0–34.0)
MCHC: 33.5 g/dL (ref 30.0–36.0)
MCV: 102.6 fL — ABNORMAL HIGH (ref 80.0–100.0)
Monocytes Absolute: 0.3 K/uL (ref 0.1–1.0)
Monocytes Relative: 11 %
Neutro Abs: 2 K/uL (ref 1.7–7.7)
Neutrophils Relative %: 67 %
Platelet Count: 202 K/uL (ref 150–400)
RBC: 3.08 MIL/uL — ABNORMAL LOW (ref 3.87–5.11)
RDW: 17.3 % — ABNORMAL HIGH (ref 11.5–15.5)
WBC Count: 3 K/uL — ABNORMAL LOW (ref 4.0–10.5)
nRBC: 0 % (ref 0.0–0.2)

## 2023-12-07 LAB — CMP (CANCER CENTER ONLY)
ALT: 14 U/L (ref 0–44)
AST: 23 U/L (ref 15–41)
Albumin: 3.5 g/dL (ref 3.5–5.0)
Alkaline Phosphatase: 121 U/L (ref 38–126)
Anion gap: 7 (ref 5–15)
BUN: 10 mg/dL (ref 6–20)
CO2: 29 mmol/L (ref 22–32)
Calcium: 9.4 mg/dL (ref 8.9–10.3)
Chloride: 105 mmol/L (ref 98–111)
Creatinine: 1.03 mg/dL — ABNORMAL HIGH (ref 0.44–1.00)
GFR, Estimated: >60 mL/min (ref >60)
Glucose, Bld: 168 mg/dL — ABNORMAL HIGH (ref 70–99)
Potassium: 3.5 mmol/L (ref 3.5–5.1)
Sodium: 141 mmol/L (ref 135–145)
Total Bilirubin: 0.4 mg/dL (ref 0.0–1.2)
Total Protein: 7 g/dL (ref 6.5–8.1)

## 2023-12-07 LAB — TSH: TSH: 2.31 u[IU]/mL (ref 0.350–4.500)

## 2023-12-07 MED ORDER — ONDANSETRON HCL 4 MG/2ML IJ SOLN
8.0000 mg | Freq: Once | INTRAMUSCULAR | Status: AC
  Administered 2023-12-07: 8 mg via INTRAVENOUS
  Filled 2023-12-07: qty 4

## 2023-12-07 MED ORDER — ENSURE ENLIVE PO LIQD: ORAL | 12 refills | Status: AC

## 2023-12-07 MED ORDER — ONDANSETRON 8 MG PO TBDP
8.0000 mg | ORAL_TABLET | Freq: Three times a day (TID) | ORAL | 2 refills | Status: AC | PRN
Start: 2023-12-07 — End: ?

## 2023-12-07 MED ORDER — SODIUM CHLORIDE 0.9 % IV SOLN: Freq: Once | INTRAVENOUS | Status: AC

## 2023-12-07 MED ORDER — SODIUM CHLORIDE 0.9 % IV SOLN
400.0000 mg/m2 | Freq: Once | INTRAVENOUS | Status: AC
  Administered 2023-12-07: 600 mg via INTRAVENOUS
  Filled 2023-12-07: qty 20

## 2023-12-07 MED ORDER — CYANOCOBALAMIN 1000 MCG/ML IJ SOLN
1000.0000 ug | Freq: Once | INTRAMUSCULAR | Status: AC
  Administered 2023-12-07: 1000 ug via INTRAMUSCULAR
  Filled 2023-12-07: qty 1

## 2023-12-07 MED ORDER — SODIUM CHLORIDE 0.9 % IV SOLN
200.0000 mg | Freq: Once | INTRAVENOUS | Status: AC
  Administered 2023-12-07: 200 mg via INTRAVENOUS
  Filled 2023-12-07: qty 200

## 2023-12-07 MED ORDER — ACETAMINOPHEN 325 MG PO TABS
650.0000 mg | ORAL_TABLET | Freq: Once | ORAL | Status: AC
  Administered 2023-12-07: 650 mg via ORAL
  Filled 2023-12-07: qty 2

## 2023-12-07 NOTE — Progress Notes (Signed)
 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 She is currently receiving maintenance keytruda  + alimta . Patient under the care of Dr. Sherrod  Patient last seen by this RD 05/20/21   Met with patient in infusion. She is doing okay today. Appetite continues to be poor. She is eating one meal/day which is usually dinner. Last night had taco salad. Tries to snack during the day. Intake can be challenging secondary to SOB and intermittent nausea. Taking antiemetics as needed. Patient is drinking 1 Ensure original when she has them.   Medications: B12 IM, glovite, synthroid , zofran -odt  Labs: glucose 168, Cr 1.03  Anthropometrics: Wt 99 lb today   10/15 - 97 lb 4.8 oz 9/3 - 103 lb 3.2 oz   Estimated Energy Needs  Kcals: 1575-1800 Protein: 58-67 Fluid: 1.5 L  NUTRITION DIAGNOSIS: Unintended wt loss continues - supplementing with ONS   INTERVENTION:  Encourage high calorie high protein snacks during the day - snack ideas provided Suggested choosing foods with soft moist textures for ease of intake Continue drinking Ensure, suggested switching to Ensure Plus HP/equivalent for added calories and protein - 2/day  Samples of Ensure Complete + coupons provided - will reach out to [employer] (DME) to inquire about supplement assistance via medicaid    MONITORING, EVALUATION, GOAL: wt trends, intake    NEXT VISIT: Wednesday November 26 during infusion

## 2023-12-07 NOTE — Patient Instructions (Signed)
 [facility] - A DEPT OF [facility]  Discharge Instructions: Thank you for choosing [facility] to provide your oncology and hematology care.   If you have a lab appointment with the Cancer Center, please go directly to the Cancer Center and check in at the registration area.   Wear comfortable clothing and clothing appropriate for easy access to any Portacath or PICC line.   We strive to give you quality time with your provider. You may need to reschedule your appointment if you arrive late (15 or more minutes).  Arriving late affects you and other patients whose appointments are after yours.  Also, if you miss three or more appointments without notifying the office, you may be dismissed from the clinic at the provider's discretion.      For prescription refill requests, have your pharmacy contact our office and allow 72 hours for refills to be completed.    Today you received the following chemotherapy and/or immunotherapy agents: Keytruda, Alimta.       To help prevent nausea and vomiting after your treatment, we encourage you to take your nausea medication as directed.  BELOW ARE SYMPTOMS THAT SHOULD BE REPORTED IMMEDIATELY: *FEVER GREATER THAN 100.4 F (38 C) OR HIGHER *CHILLS OR SWEATING *NAUSEA AND VOMITING THAT IS NOT CONTROLLED WITH YOUR NAUSEA MEDICATION *UNUSUAL SHORTNESS OF BREATH *UNUSUAL BRUISING OR BLEEDING *URINARY PROBLEMS (pain or burning when urinating, or frequent urination) *BOWEL PROBLEMS (unusual diarrhea, constipation, pain near the anus) TENDERNESS IN MOUTH AND THROAT WITH OR WITHOUT PRESENCE OF ULCERS (sore throat, sores in mouth, or a toothache) UNUSUAL RASH, SWELLING OR PAIN  UNUSUAL VAGINAL DISCHARGE OR ITCHING   Items with * indicate a potential emergency and should be followed up as soon as possible or go to the Emergency Department if any problems should occur.  Please show the CHEMOTHERAPY ALERT CARD or  IMMUNOTHERAPY ALERT CARD at check-in to the Emergency Department and triage nurse.  Should you have questions after your visit or need to cancel or reschedule your appointment, please contact [facility] - A DEPT OF [facility]  Dept: [phone]  and follow the prompts.  Office hours are 8:00 a.m. to 4:30 p.m. Monday - Friday. Please note that voicemails left after 4:00 p.m. may not be returned until the following business day.  We are closed weekends and major holidays. You have access to a nurse at all times for urgent questions. Please call the main number to the clinic Dept: [phone] and follow the prompts.   For any non-urgent questions, you may also contact your provider using MyChart. We now offer e-Visits for anyone 61 and older to request care online for non-urgent symptoms. For details visit [email or web address].   Also download the MyChart app! Go to the app store, search "MyChart", open the app, select , and log in with your MyChart username and password.

## 2023-12-08 ENCOUNTER — Other Ambulatory Visit: Payer: Self-pay | Admitting: Physician Assistant

## 2023-12-08 DIAGNOSIS — E039 Hypothyroidism, unspecified: Secondary | ICD-10-CM

## 2023-12-08 LAB — T4: T4, Total: 10.8 ug/dL (ref 4.5–12.0)

## 2023-12-09 ENCOUNTER — Other Ambulatory Visit: Payer: Self-pay

## 2023-12-16 ENCOUNTER — Other Ambulatory Visit: Payer: Self-pay | Admitting: Internal Medicine

## 2023-12-16 DIAGNOSIS — C3411 Malignant neoplasm of upper lobe, right bronchus or lung: Secondary | ICD-10-CM

## 2023-12-28 ENCOUNTER — Inpatient Hospital Stay: Admitting: Dietician

## 2023-12-28 ENCOUNTER — Inpatient Hospital Stay

## 2023-12-28 ENCOUNTER — Inpatient Hospital Stay (HOSPITAL_BASED_OUTPATIENT_CLINIC_OR_DEPARTMENT_OTHER): Admitting: Physician Assistant

## 2023-12-28 VITALS — BP 112/67 | HR 100 | Temp 98.8°F | Resp 16 | Wt 98.4 lb

## 2023-12-28 DIAGNOSIS — C3491 Malignant neoplasm of unspecified part of right bronchus or lung: Secondary | ICD-10-CM

## 2023-12-28 DIAGNOSIS — Z5112 Encounter for antineoplastic immunotherapy: Secondary | ICD-10-CM

## 2023-12-28 DIAGNOSIS — Z5111 Encounter for antineoplastic chemotherapy: Secondary | ICD-10-CM

## 2023-12-28 LAB — CBC WITH DIFFERENTIAL (CANCER CENTER ONLY)
Abs Immature Granulocytes: 0.01 K/uL (ref 0.00–0.07)
Basophils Absolute: 0 K/uL (ref 0.0–0.1)
Basophils Relative: 0 %
Eosinophils Absolute: 0.1 K/uL (ref 0.0–0.5)
Eosinophils Relative: 2 %
HCT: 30.3 % — ABNORMAL LOW (ref 36.0–46.0)
Hemoglobin: 10.2 g/dL — ABNORMAL LOW (ref 12.0–15.0)
Immature Granulocytes: 0 %
Lymphocytes Relative: 8 %
Lymphs Abs: 0.5 K/uL — ABNORMAL LOW (ref 0.7–4.0)
MCH: 35.8 pg — ABNORMAL HIGH (ref 26.0–34.0)
MCHC: 33.7 g/dL (ref 30.0–36.0)
MCV: 106.3 fL — ABNORMAL HIGH (ref 80.0–100.0)
Monocytes Absolute: 0.4 K/uL (ref 0.1–1.0)
Monocytes Relative: 7 %
Neutro Abs: 4.8 K/uL (ref 1.7–7.7)
Neutrophils Relative %: 83 %
Platelet Count: 235 K/uL (ref 150–400)
RBC: 2.85 MIL/uL — ABNORMAL LOW (ref 3.87–5.11)
RDW: 16.5 % — ABNORMAL HIGH (ref 11.5–15.5)
WBC Count: 5.8 K/uL (ref 4.0–10.5)
nRBC: 0 % (ref 0.0–0.2)

## 2023-12-28 LAB — CMP (CANCER CENTER ONLY)
ALT: 15 U/L (ref 0–44)
AST: 27 U/L (ref 15–41)
Albumin: 3.7 g/dL (ref 3.5–5.0)
Alkaline Phosphatase: 138 U/L — ABNORMAL HIGH (ref 38–126)
Anion gap: 11 (ref 5–15)
BUN: 12 mg/dL (ref 6–20)
CO2: 26 mmol/L (ref 22–32)
Calcium: 9.5 mg/dL (ref 8.9–10.3)
Chloride: 104 mmol/L (ref 98–111)
Creatinine: 1.02 mg/dL — ABNORMAL HIGH (ref 0.44–1.00)
GFR, Estimated: >60 mL/min (ref >60)
Glucose, Bld: 136 mg/dL — ABNORMAL HIGH (ref 70–99)
Potassium: 3.4 mmol/L — ABNORMAL LOW (ref 3.5–5.1)
Sodium: 141 mmol/L (ref 135–145)
Total Bilirubin: 0.4 mg/dL (ref 0.0–1.2)
Total Protein: 7.3 g/dL (ref 6.5–8.1)

## 2023-12-28 MED ORDER — ONDANSETRON HCL 4 MG/2ML IJ SOLN
8.0000 mg | Freq: Once | INTRAMUSCULAR | Status: AC
  Administered 2023-12-28: 8 mg via INTRAVENOUS
  Filled 2023-12-28: qty 4

## 2023-12-28 MED ORDER — SODIUM CHLORIDE 0.9 % IV SOLN
400.0000 mg/m2 | Freq: Once | INTRAVENOUS | Status: AC
  Administered 2023-12-28: 600 mg via INTRAVENOUS
  Filled 2023-12-28: qty 20

## 2023-12-28 MED ORDER — SODIUM CHLORIDE 0.9 % IV SOLN: Freq: Once | INTRAVENOUS | Status: AC

## 2023-12-28 MED ORDER — SODIUM CHLORIDE 0.9 % IV SOLN
200.0000 mg | Freq: Once | INTRAVENOUS | Status: AC
  Administered 2023-12-28: 200 mg via INTRAVENOUS
  Filled 2023-12-28: qty 200

## 2023-12-28 NOTE — Progress Notes (Signed)
 [facility] OFFICE PROGRESS NOTE  Julie Nicholson, [place], NP [address] [place] [address]  DIAGNOSIS: Recurrent/metastatic non-small cell lung cancer initially diagnosed as stage IIIB  (T1b, N3, M0) non-small cell lung cancer, adenocarcinoma she presented with right upper lobe nodule in addition to bulky right hilar, mediastinal, and left supraclavicular lymphadenopathy diagnosed in November 2022.  The patient had evidence for disease recurrence in the mediastinal and right supraclavicular lymphadenopathy in August 2023.  The patient had evidence of metastatic disease in February 2024 with several supraclavicular, thoracic and precarinal lymphadenopathy as well as upper abdominal lymph nodes and small liver lesion.   DETECTED ALTERATION(S) / BIOMARKER(S)% CFDNA OR AMPLIFICATIONASSOCIATED FDA-APPROVED THERAPIESCLINICAL TRIAL AVAILABILITY TP53V143A ND 0.5 5 50 100 4.7%   RHOAG17E ND 0.5 5 50 100 1.8%   CTNNB1S37C ND 0.5 5 50 100 1.9%   BIOMARKERADDITIONAL DETAILS Tumor Mutational Burden (TMB) 19.02 mut/Mb MSI Status Stable (MSS) PD-L1 Tumor Proportion Score (TPS)* <1%   Biomarker Findings By [facility] on 05/06/2022 Tumor Mutational Burden - 17 Muts/Mb Microsatellite status - MS-Stable Genomic Findings For a complete list of the genes assayed, please refer to the Appendix. CTNNB1 S37C CRKL amplification MAP2K4 loss exons 2-11 TP53 V143A 8 Disease relevant genes with no reportable alterations: ALK, BRAF, EGFR, ERBB2, KRAS, MET, RET, ROS1   PDL1 0%  PRIOR THERAPY: 1) Concurrent chemoradiation with carboplatin  for an AUC of 2 and paclitaxel  45 mg per metered squared.  First dose on 01/05/2021.  Status post 7 cycles of treatment.  Last dose was given February 16, 2021. 2) Consolidation immunotherapy with Imfinzi  1500 Mg IV every 4 weeks.  First dose March 25, 2021.  Status post 3 cycles.  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 3) Palliative radiotherapy to the enlarging right supraclavicular lymphadenopathy under the care of Julie Nicholson expected to be completed on October 28, 2021 4) Radiation to the enlarging left supraclavicular lymph node under the care of Julie Nicholson. Last dose expected on 01/07/23   CURRENT THERAPY: Palliative systemic chemotherapy with carboplatin  for AUC of 5, Alimta  500 Mg/M2 and Keytruda  200 Mg IV every 3 weeks.  First dose April 05, 2022.  Status post 26 cycles.  Starting from cycle #3 her dose of carboplatin  was reduced to AUC of 4 and Alimta  400 Mg/M2.  Starting from cycle #5 the patient will be on maintenance treatment with Alimta  and Keytruda  every 3 weeks.   INTERVAL HISTORY: Julie Nicholson 58 y.o. female returns to the clinic today for a follow-up visit.  The patient was last seen in the clinic on 12/07/2023 by Julie Heilingoetter PA-C.  She is currently undergoing chemotherapy and immunotherapy.  [name] reports she is tolerating treatment without any new or concerning symptoms. Her energy is stable with intermittent episodes of fatigue. Her appetite and weight are unchanged. She denies nausea, vomiting or bowel habit changes. She has recurrent right knee pain secondary to pseudogout which can impact her ambulation. She recently started antibiotic therapy for this that is managed by her PCP. She denies easy bruising or overt signs of bleeding. She reports intermittent dry cough and shortness of breath with exertion. She denies fevers, chills, sweats, chest pain, headaches or dizziness. Rest of the ROS is below.     MEDICAL HISTORY: Past Medical History:  Diagnosis Date   Abdominal discomfort    Acute sinusitis 11/11/2021   Adenocarcinoma of right lung, stage 4 (HCC) 12/18/2020   Allergic rhinitis 05/08/2021  Antineoplastic chemotherapy induced pancytopenia 02/23/2021   Aortic atherosclerosis 06/24/2023   Cancer (HCC)    Chronic cholecystitis 03/13/2013    Chronic headaches    due to allergies, sinus   Chronic venous hypertension (idiopathic) with other complications of bilateral lower extremity    Constipation 06/23/2023   COPD (chronic obstructive pulmonary disease) (HCC)    per 2012 chest xray   pt states she doesn not have this now (04/10/2013)   COVID-19 virus infection 02/24/2021   Deviated nasal septum    Dyspnea 04/07/2021   Encounter for antineoplastic chemotherapy 12/18/2020   Encounter for antineoplastic immunotherapy 11/17/2022   Eustachian tube dysfunction    Former smoker 07/20/2023   GERD (gastroesophageal reflux disease)    occasional uses Tums / Rolaids   Goals of care, counseling/discussion 03/18/2021   Hearing loss    right ear   HFrEF (heart failure with reduced ejection fraction) (HCC) 07/13/2023   High cholesterol    History of radiation therapy    right lung 01/07/2021-02/19/2021  Julie Nicholson   History of radiation therapy    Lymph nodes of head,face,neck- 12/23/22-01/07/23- Julie. 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  MEDICATIONS:  Current Outpatient Medications  Medication Sig Dispense Refill   acetaminophen  (TYLENOL ) 500 MG tablet Take 500 mg by mouth every 6 (six) hours as needed for moderate pain  (pain score 4-6).     albuterol  (PROVENTIL ) (2.5 MG/3ML) 0.083% nebulizer solution Take 3 mLs (2.5 mg total) by nebulization every 6 (six) hours as needed. 120 mL 5   albuterol  (VENTOLIN  HFA) 108 (90 Base) MCG/ACT inhaler Inhale 1-2 puffs into the lungs every 6 (six) hours as needed for wheezing or shortness of breath. 18 g 3   aspirin  EC 81 MG tablet Take 81 mg by mouth at bedtime.     atorvastatin  (LIPITOR) 40 MG tablet Take 40 mg by mouth at bedtime.     azelastine  (ASTELIN ) 0.1 % nasal spray Place 1 spray into both nostrils 2 (two) times daily as needed. Use in each nostril as directed 30 mL 5   benzonatate  (TESSALON ) 200 MG capsule Take 1 capsule (200 mg total) by mouth 3 (three) times daily as needed. 30 capsule 0   Biotin 5000 MCG TABS Take 10,000 mcg by mouth at bedtime.     buPROPion  (WELLBUTRIN  XL) 150 MG 24 hr tablet Take 150 mg by mouth at bedtime.     Cyanocobalamin  (VITAMIN B-12) 5000 MCG TBDP Take 5,000 mcg by mouth at bedtime.     docusate sodium  (COLACE) 100 MG capsule Take 200 mg by mouth at bedtime.     famotidine  (PEPCID ) 20 MG tablet Take 1 tablet by mouth twice daily (Patient taking differently: Take 20 mg by mouth at  bedtime.) 60 tablet 0   feeding supplement (ENSURE ENLIVE / ENSURE PLUS) LIQD Ensure Plus HP/equivalent - drink 237 ml BID  12   fluticasone  (FLONASE ) 50 MCG/ACT nasal spray Place 2 sprays into both nostrils daily. (Patient taking differently: Place 2 sprays into both nostrils daily as needed for allergies.) 16 g 5   folic acid  (FOLVITE ) 1 MG tablet Take 1 tablet by mouth once daily 30 tablet 0   furosemide  (LASIX ) 20 MG tablet Take 20 mg by mouth as needed for fluid or edema. (Patient not taking: Reported on 10/29/2023)     guaiFENesin  (MUCINEX ) 600 MG 12 hr tablet Take 600 mg by mouth at bedtime.     hydrocortisone  2.5 % ointment APPLY OINTMENT EXTERNALLY TWICE DAILY TO THE LOWER LEGS FOR UP TO 2 WEEKS MAXIMUM External; Duration: 30 Days     levothyroxine   (SYNTHROID ) 25 MCG tablet TAKE 1 TABLET BY MOUTH ONCE DAILY BEFORE BREAKFAST 30 tablet 0   lidocaine -prilocaine  (EMLA ) cream APPLY TO PORT-A-CATH 30 TO 60 MINUTES BEFORE TREATMENT 30 g 0   linaCLOtide (LINZESS PO) Take 1 tablet by mouth daily.     loratadine  (CLARITIN ) 10 MG tablet Take 1 tablet by mouth once daily 30 tablet 0   Multiple Vitamins-Minerals (HAIR SKIN AND NAILS FORMULA PO) Take 1 tablet by mouth daily.     Omega-3 Fatty Acids ([employer] NATURAL FISH OIL) 1000 MG CAPS Take 2,000 mg by mouth at bedtime.     ondansetron  (ZOFRAN -ODT) 8 MG disintegrating tablet Take 1 tablet (8 mg total) by mouth every 8 (eight) hours as needed for nausea or vomiting. 30 tablet 2   Oxycodone  HCl 10 MG TABS Take 10 mg by mouth 5 (five) times daily.     polyethylene glycol powder (MIRALAX ) 17 GM/SCOOP powder Take 17 g by mouth 2 (two) times daily as needed for mild constipation. 255 g 1   prochlorperazine  (COMPAZINE ) 10 MG tablet Take 1 tablet (10 mg total) by mouth every 6 (six) hours as needed. (Patient not taking: Reported on 10/29/2023) 30 tablet 2   rizatriptan (MAXALT-MLT) 10 MG disintegrating tablet Take 10 mg by mouth as needed for migraine. May repeat in 2 hours if needed     sertraline  (ZOLOFT ) 50 MG tablet Take 1 tablet by mouth daily.     Tiotropium Bromide-Olodaterol (STIOLTO RESPIMAT ) 2.5-2.5 MCG/ACT AERS Inhale 2 puffs into the lungs daily. 12 g 3   triamcinolone cream (KENALOG) 0.1 % Apply 1 Application topically daily as needed (for nausea).     No current facility-administered medications for this visit.   Facility-Administered Medications Ordered in Other Visits  Medication Dose Route Frequency Provider Last Rate Last Admin   PEMEtrexed  Disodium (ALIMTA ) 600 mg in sodium chloride  0.9 % 100 mL chemo infusion  400 mg/m2 (Treatment Plan Recorded) Intravenous Once Sherrod Sherrod, MD        SURGICAL HISTORY:  Past Surgical History:  Procedure Laterality Date   ABDOMINAL HYSTERECTOMY  1995    tx endometriosis, both ovaries removed   ADENOIDECTOMY     APPENDECTOMY  late 1990's   BIOPSY  02/26/2021   Procedure: BIOPSY;  Surgeon: Teressa Toribio SQUIBB, MD;  Location: [facility]COPY;  Service: Endoscopy;;   BRONCHIAL BRUSHINGS  12/15/2020   Procedure: BRONCHIAL BRUSHINGS;  Surgeon: Shelah Lamar RAMAN, MD;  Location: [facility] ENDOSCOPY;  Service: Cardiopulmonary;;   BRONCHIAL NEEDLE ASPIRATION BIOPSY  12/15/2020   Procedure: BRONCHIAL NEEDLE ASPIRATION BIOPSIES;  Surgeon: Shelah Lamar RAMAN, MD;  Location: [facility] ENDOSCOPY;  Service: Cardiopulmonary;;   CHOLECYSTECTOMY  04/10/2013   CHOLECYSTECTOMY N/A 04/10/2013   Procedure: LAPAROSCOPIC CHOLECYSTECTOMY WITH INTRAOPERATIVE CHOLANGIOGRAM;  Surgeon: Krystal JINNY Russell, MD;  Location[facility] OR;  Service: General;  Laterality: N/A;   ELECTROMAGNETIC NAVIGATION BROCHOSCOPY  12/15/2020   Procedure: ELECTROMAGNETIC NAVIGATION BRONCHOSCOPY;  Surgeon: Shelah Lamar RAMAN, MD;  Location: [facility] ENDOSCOPY;  Service: Cardiopulmonary;;   ESOPHAGOGASTRODUODENOSCOPY (EGD) WITH PROPOFOL  N/A 02/26/2021   Procedure: ESOPHAGOGASTRODUODENOSCOPY (EGD) WITH PROPOFOL ;  Surgeon: Teressa Toribio SQUIBB, MD;  Location: [facility] ENDOSCOPY;  Service: Endoscopy;  Laterality: N/A;   EUS N/A 02/16/2013   Procedure: UPPER ENDOSCOPIC ULTRASOUND (EUS) LINEAR;  Surgeon: Toribio SQUIBB Teressa, MD;  Location: [facility] ENDOSCOPY;  Service: Endoscopy;  Laterality: N/A;  radial linear   IR IMAGING GUIDED PORT INSERTION  06/30/2022   KNEE ARTHROSCOPY Right 1980's   cartilage OR   LAPAROSCOPIC ENDOMETRIOSIS FULGURATION  1980's   MYRINGOTOMY WITH TUBE PLACEMENT Right 07/13/2018   Procedure: MYRINGOTOMY WITH TUBE PLACEMENT;  Surgeon: Edda Mt, MD;  Location: [facility];  Service: ENT;  Laterality: Right;   MYRINGOTOMY WITH TUBE PLACEMENT Right 06/04/2021   Procedure: MYRINGOTOMY WITH BUTTERFLY TUBE PLACEMENT;  Surgeon: Edda Mt, MD;  Location: [facility];  Service: ENT;  Laterality: Right;    NASOPHARYNGOSCOPY EUSTATION TUBE BALLOON DILATION Right 07/13/2018   Procedure: NASOPHARYNGOSCOPY EUSTATION TUBE BALLOON DILATION;  Surgeon: Edda Mt, MD;  Location: [facility];  Service: ENT;  Laterality: Right;   TONSILLECTOMY AND ADENOIDECTOMY  ~ 1980   adenoidectomy   TUBAL LIGATION  ~ 1987   TURBINATE REDUCTION Right 07/13/2018   Procedure: OUTFRACTURE TURBINATE;  Surgeon: Edda Mt, MD;  Location: [facility];  Service: ENT;  Laterality: Right;   TYMPANOSTOMY TUBE PLACEMENT     VIDEO BRONCHOSCOPY WITH ENDOBRONCHIAL ULTRASOUND N/A 12/15/2020   Procedure: ROBOTIC VIDEO BRONCHOSCOPY WITH ENDOBRONCHIAL ULTRASOUND;  Surgeon: Shelah Lamar RAMAN, MD;  Location: [facility] ENDOSCOPY;  Service: Cardiopulmonary;  Laterality: N/A;   WRIST SURGERY Left    w/plate    REVIEW OF SYSTEMS:   Constitutional: Positive for fatigue.Negative for chills and fever.  HENT: Negative for mouth sores, nosebleeds, sore throat and trouble swallowing.   Eyes: Negative for eye problems and icterus.  Respiratory: Positive for intermittent cough and shortness of breath. Negative for hemoptysis and wheezing.   Cardiovascular: Negative for chest pain and leg swelling.  Gastrointestinal: Negative for abdominal pain, constipation, diarrhea, nausea and vomiting.  Genitourinary: Negative for bladder incontinence, difficulty urinating, dysuria, frequency and hematuria.   Musculoskeletal: Negative for gait problem, neck pain and neck stiffness.  Skin: Negative for itching and rash.  Neurological:. Negative for headaches, dizziness, extremity weakness, gait problem, light-headedness and seizures.  Hematological: Negative for adenopathy. Does not bruise/bleed easily.  Psychiatric/Behavioral: Negative for confusion, depression and sleep disturbance. The patient is not nervous/anxious.     PHYSICAL EXAMINATION:  There were no vitals taken for this visit.    Day 1, Cycle 28 12/28/23  Weight 98 lb 6.4 oz  (44.6 kg)  Temp 98.5 F (36.9 C)  Temp src Oral  Pulse 98  Resp 17  BP 113/74   ECOG PERFORMANCE STATUS: 1  Physical Exam  Constitutional: Oriented to person, place, and time and well-developed, well-nourished, and in no distress.  HENT:  Head: Normocephalic and atraumatic.  Mouth/Throat: Oropharynx is clear and moist. No oropharyngeal exudate.  Eyes: Conjunctivae are normal. Right eye exhibits no discharge. Left eye exhibits no discharge. No scleral icterus.  Neck: Normal range of motion. Neck  supple.  Cardiovascular: Normal rate, regular rhythm, normal heart sounds and intact distal pulses.   Pulmonary/Chest: Effort normal and breath sounds normal. No respiratory distress. No wheezes. No rales.  Musculoskeletal: Normal range of motion. Exhibits no edema.  Lymphadenopathy:    No cervical adenopathy.  Neurological: Alert and oriented to person, place, and time. Skin: Skin is warm and dry. No rash noted. Not diaphoretic. No erythema. No pallor.  Psychiatric: Mood, memory and judgment normal.  Vitals reviewed.  LABORATORY DATA: Lab Results  Component Value Date   WBC 5.8 12/28/2023   HGB 10.2 (L) 12/28/2023   HCT 30.3 (L) 12/28/2023   MCV 106.3 (H) 12/28/2023   PLT 235 12/28/2023      Chemistry      Component Value Date/Time   NA 141 12/28/2023 0841   K 3.4 (L) 12/28/2023 0841   CL 104 12/28/2023 0841   CO2 26 12/28/2023 0841   BUN 12 12/28/2023 0841   CREATININE 1.02 (H) 12/28/2023 0841      Component Value Date/Time   CALCIUM  9.5 12/28/2023 0841   ALKPHOS 138 (H) 12/28/2023 0841   AST 27 12/28/2023 0841   ALT 15 12/28/2023 0841   BILITOT 0.4 12/28/2023 0841       RADIOGRAPHIC STUDIES:  No results found.    ASSESSMENT/PLAN:  URA YINGLING is a pleasant 58 year old Caucasian female with recurrent lung cancer.  #Metastatic lung cancer: --Originally diagnosed with Stage IIIB(T1b, N3, M0) non-small cell lung cancer, adenocarcinoma. She presented with a  right upper lobe spiculated nodule and right hilar, mediastinal, and left supraclavicular adenopathy.  She was diagnosed in November 2022.   Therefore, the patient had guardant 360 performed which showed no actionable mutations.  Her PD-L1 expression is less than 1%.  She was found to have metastatic disease in February 2024  --Completed a course of concurrent chemoradiation with carboplatin  for an AUC of 2 and paclitaxel  45 mg per metered square.  She status post 7 cycles.    --Completed consolidation immunotherapy with Imfinzi  1500 mg IV every 4 weeks. After 3 cycles, she developed suspicious immunotherapy mediated pneumonitis and this was ultimately discontinued.  --Started palliative radiotherapy to the enlarging right supraclavicular lymphadenopathy. This was completed on October 28, 2021.  --The patient was found to have metastatic disease in February 2024 with several lymphadenopathy in the chest as well as supraclavicular, upper abdomen as well as suspicious small liver metastasis. --The patient also had ultrasound-guided core biopsy of a left supraclavicular lymph node and the final pathology was consistent with metastatic moderate to poorly differentiated adenocarcinoma of the lung primary. --Started systemic chemotherapy with carboplatin  for AUC of 5, Alimta  500 Mg/M2 and Keytruda  200 Mg IV every 3 weeks status post 26 cycles. With cycle #3, Julie. Sherrod reduced her dose of carboplatin  to AUC of 4 and Alimta  400 Mg/M2. With cycle #5 the patient started on maintenance treatment with Alimta  and Keytruda  every 3 weeks.  PLAN: --Due for Cycle 28, Day 1 of Alimta  and Keytruda  today.  --Labs from today were reviewed and adequate for treatment. WBC 5.8, Hgb 10.2, Plt 235, creatinine stable at 1.02, LFTs in range. --Proceed with treatment today without any dose modifications --RTC in 3 weeks with labs and follow up prior to Cycle 29. She will be due for restaging CT scan in December 2025.      The  patient was advised to call immediately if she has any concerning symptoms in the interval. The patient voices understanding of  current disease status and treatment options and is in agreement with the current care plan.  All questions were answered. The patient knows to call the clinic with any problems, questions or concerns. We can certainly see the patient much sooner if necessary    I have spent a total of 30 minutes minutes of face-to-face and non-face-to-face time, preparing to see the patient, performing a medically appropriate examination, counseling and educating the patient, documenting clinical information in the electronic health record, independently interpreting results and communicating results to the patient, and care coordination.   Johnston Police PA-C Dept of Hematology and Oncology [facility] Phone: [phone]

## 2023-12-28 NOTE — Patient Instructions (Signed)
 [facility] - A DEPT OF [facility]  Discharge Instructions: Thank you for choosing [facility] to provide your oncology and hematology care.   If you have a lab appointment with the Cancer Center, please go directly to the Cancer Center and check in at the registration area.   Wear comfortable clothing and clothing appropriate for easy access to any Portacath or PICC line.   We strive to give you quality time with your provider. You may need to reschedule your appointment if you arrive late (15 or more minutes).  Arriving late affects you and other patients whose appointments are after yours.  Also, if you miss three or more appointments without notifying the office, you may be dismissed from the clinic at the provider's discretion.      For prescription refill requests, have your pharmacy contact our office and allow 72 hours for refills to be completed.    Today you received the following chemotherapy and/or immunotherapy agents: Keytruda, Alimta.       To help prevent nausea and vomiting after your treatment, we encourage you to take your nausea medication as directed.  BELOW ARE SYMPTOMS THAT SHOULD BE REPORTED IMMEDIATELY: *FEVER GREATER THAN 100.4 F (38 C) OR HIGHER *CHILLS OR SWEATING *NAUSEA AND VOMITING THAT IS NOT CONTROLLED WITH YOUR NAUSEA MEDICATION *UNUSUAL SHORTNESS OF BREATH *UNUSUAL BRUISING OR BLEEDING *URINARY PROBLEMS (pain or burning when urinating, or frequent urination) *BOWEL PROBLEMS (unusual diarrhea, constipation, pain near the anus) TENDERNESS IN MOUTH AND THROAT WITH OR WITHOUT PRESENCE OF ULCERS (sore throat, sores in mouth, or a toothache) UNUSUAL RASH, SWELLING OR PAIN  UNUSUAL VAGINAL DISCHARGE OR ITCHING   Items with * indicate a potential emergency and should be followed up as soon as possible or go to the Emergency Department if any problems should occur.  Please show the CHEMOTHERAPY ALERT CARD or  IMMUNOTHERAPY ALERT CARD at check-in to the Emergency Department and triage nurse.  Should you have questions after your visit or need to cancel or reschedule your appointment, please contact [facility] - A DEPT OF [facility]  Dept: [phone]  and follow the prompts.  Office hours are 8:00 a.m. to 4:30 p.m. Monday - Friday. Please note that voicemails left after 4:00 p.m. may not be returned until the following business day.  We are closed weekends and major holidays. You have access to a nurse at all times for urgent questions. Please call the main number to the clinic Dept: [phone] and follow the prompts.   For any non-urgent questions, you may also contact your provider using MyChart. We now offer e-Visits for anyone 61 and older to request care online for non-urgent symptoms. For details visit [email or web address].   Also download the MyChart app! Go to the app store, search "MyChart", open the app, select , and log in with your MyChart username and password.

## 2023-12-28 NOTE — Progress Notes (Signed)
 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 She is currently receiving maintenance keytruda  + alimta . Patient under the care of Dr. Sherrod   DME: [employer]   Met with patient in infusion. She reports doing okay. Appetite is about the same. Eats dinner meal and will snack through out the day. Yesterday had 2 boiled eggs, fruit, and lasagna for dinner. Patient has been set up with DME for Ensure supplements. She is currently waiting on shipment to arrive.    Medications: reviewed   Labs: glucose 136, Cr 1.02  Anthropometrics: Wt 98 lb 6.4 oz today   11/5 - 99 lb  10/15 - 97 lb 4.8 oz  9/3 - 103 lb 3.2 oz   Estimated Energy Needs  Kcals: 1575-1800 Protein: 58-67 Fluid: 1.5 L  NUTRITION DIAGNOSIS: Unintended wt loss - addressing with ONS   INTERVENTION:  Encourage protein foods at every meal - suggested peanut butter with apples or sliced cheese with fruits Ensure Plus/equivalent 2/day - Pt approved for Ensure coverage via medicaid - awaiting arrival at this time. Encourage pt to call DME on Monday if Ensure has not arrived  MONITORING, EVALUATION, GOAL: wt trends, intake   NEXT VISIT: Wednesday December 17 during infusion

## 2024-01-02 ENCOUNTER — Telehealth: Payer: Self-pay | Admitting: *Deleted

## 2024-01-02 ENCOUNTER — Other Ambulatory Visit: Payer: Self-pay | Admitting: Emergency Medicine

## 2024-01-02 NOTE — Telephone Encounter (Signed)
 Julie Nicholson [place], [phone] (home) , A form was emailed to me that I need my doctor to fill out.  I have custody of my granddaughter and have Stage IV cancer.   Declined offer to forward email to form staff.  I don't know how to do this.  I'll be  there in a few.  No further questions or needs.

## 2024-01-05 ENCOUNTER — Telehealth: Payer: Self-pay

## 2024-01-05 NOTE — Telephone Encounter (Signed)
 LVM for pt in regard to her forms. Left call back number.

## 2024-01-06 ENCOUNTER — Other Ambulatory Visit: Payer: Self-pay

## 2024-01-09 ENCOUNTER — Ambulatory Visit (HOSPITAL_BASED_OUTPATIENT_CLINIC_OR_DEPARTMENT_OTHER): Admission: RE | Admit: 2024-01-09 | Discharge: 2024-01-09 | Attending: Physician Assistant

## 2024-01-09 DIAGNOSIS — C3490 Malignant neoplasm of unspecified part of unspecified bronchus or lung: Secondary | ICD-10-CM

## 2024-01-09 MED ORDER — IOHEXOL 300 MG/ML  SOLN
100.0000 mL | Freq: Once | INTRAMUSCULAR | Status: AC | PRN
  Administered 2024-01-09: 70 mL via INTRAVENOUS

## 2024-01-10 ENCOUNTER — Encounter (HOSPITAL_BASED_OUTPATIENT_CLINIC_OR_DEPARTMENT_OTHER): Payer: Self-pay | Admitting: Emergency Medicine

## 2024-01-10 ENCOUNTER — Emergency Department (HOSPITAL_BASED_OUTPATIENT_CLINIC_OR_DEPARTMENT_OTHER)

## 2024-01-10 ENCOUNTER — Other Ambulatory Visit: Payer: Self-pay

## 2024-01-10 ENCOUNTER — Emergency Department (HOSPITAL_BASED_OUTPATIENT_CLINIC_OR_DEPARTMENT_OTHER)
Admission: EM | Admit: 2024-01-10 | Discharge: 2024-01-10 | Disposition: A | Discharge status: Home / Self Care | Attending: Emergency Medicine | Admitting: Emergency Medicine

## 2024-01-10 DIAGNOSIS — W540XXA Bitten by dog, initial encounter: Secondary | ICD-10-CM

## 2024-01-10 MED ORDER — TETANUS-DIPHTH-ACELL PERTUSSIS 5-2-15.5 LF-MCG/0.5 IM SUSP
0.5000 mL | Freq: Once | INTRAMUSCULAR | Status: AC
  Administered 2024-01-10: 0.5 mL via INTRAMUSCULAR
  Filled 2024-01-10: qty 0.5

## 2024-01-10 MED ORDER — AMOXICILLIN-POT CLAVULANATE 875-125 MG PO TABS: 1.0000 | ORAL_TABLET | Freq: Two times a day (BID) | ORAL | 0 refills | Status: AC

## 2024-01-10 MED ORDER — BACITRACIN ZINC 500 UNIT/GM EX OINT
TOPICAL_OINTMENT | Freq: Once | CUTANEOUS | Status: AC
  Administered 2024-01-10: 1 via TOPICAL
  Filled 2024-01-10: qty 28.35
  Filled 2024-01-10: qty 113.4

## 2024-01-10 MED ORDER — AMOXICILLIN-POT CLAVULANATE 875-125 MG PO TABS
1.0000 | ORAL_TABLET | Freq: Once | ORAL | Status: AC
  Administered 2024-01-10: 1 via ORAL
  Filled 2024-01-10: qty 1

## 2024-01-10 NOTE — ED Provider Notes (Signed)
 [place] EMERGENCY DEPARTMENT AT [facility] Provider Note   CSN: [id number] Arrival date & time: 01/10/24  2136     Patient presents with: Laceration   Julie Nicholson is a 58 y.o. female presents today for a laceration to her right lower extremity.  Patient reports she dropped a piece of food and her dogs went to fight over it and her leg accidentally got bit.  Patient reports her dogs are up-to-date on their vaccinations.  Patient denies numbness, tingling, or any other injuries at this time.  Patient is unsure of last tetanus.    Laceration      Prior to Admission medications   Medication Sig Start Date End Date Taking? Authorizing Provider  amoxicillin -clavulanate (AUGMENTIN ) 875-125 MG tablet Take 1 tablet by mouth every 12 (twelve) hours. 01/10/24  Yes Kolin Erdahl N, PA-C  acetaminophen  (TYLENOL ) 500 MG tablet Take 500 mg by mouth every 6 (six) hours as needed for moderate pain (pain score 4-6).    [provider]  albuterol  (PROVENTIL ) (2.5 MG/3ML) 0.083% nebulizer solution Take 3 mLs (2.5 mg total) by nebulization every 6 (six) hours as needed. 11/03/23   Cobb, Comer GAILS, NP  albuterol  (VENTOLIN  HFA) 108 (90 Base) MCG/ACT inhaler Inhale 1-2 puffs into the lungs every 6 (six) hours as needed for wheezing or shortness of breath. 09/23/22   Shelah Lamar RAMAN, MD  aspirin  EC 81 MG tablet Take 81 mg by mouth at bedtime.    [provider]  atorvastatin  (LIPITOR) 40 MG tablet Take 40 mg by mouth at bedtime.    [provider]  azelastine  (ASTELIN ) 0.1 % nasal spray Place 1 spray into both nostrils 2 (two) times daily as needed. Use in each nostril as directed 11/12/22   Tobie Arleta SQUIBB, MD  benzonatate  (TESSALON ) 200 MG capsule Take 1 capsule (200 mg total) by mouth 3 (three) times daily as needed. 07/07/22   Sherrod Sherrod, MD  Biotin 5000 MCG TABS Take 10,000 mcg by mouth at bedtime.    [provider]  buPROPion  (WELLBUTRIN  XL) 150 MG 24 hr  tablet Take 150 mg by mouth at bedtime. 04/12/22   [provider]  Cyanocobalamin  (VITAMIN B-12) 5000 MCG TBDP Take 5,000 mcg by mouth at bedtime.    [provider]  docusate sodium  (COLACE) 100 MG capsule Take 200 mg by mouth at bedtime.    [provider]  famotidine  (PEPCID ) 20 MG tablet Take 1 tablet (20 mg total) by mouth at bedtime. 01/04/24   Shelah Lamar RAMAN, MD  feeding supplement (ENSURE ENLIVE / ENSURE PLUS) LIQD Ensure Plus HP/equivalent - drink 237 ml BID 12/07/23   Heilingoetter, Cassandra L, PA-C  fluticasone  (FLONASE ) 50 MCG/ACT nasal spray Place 2 sprays into both nostrils daily. Patient taking differently: Place 2 sprays into both nostrils daily as needed for allergies. 04/21/23   Shelah Lamar RAMAN, MD  folic acid  (FOLVITE ) 1 MG tablet Take 1 tablet by mouth once daily 12/16/23   Sherrod Sherrod, MD  furosemide  (LASIX ) 20 MG tablet Take 20 mg by mouth as needed for fluid or edema. Patient not taking: Reported on 10/29/2023    [provider]  guaiFENesin  (MUCINEX ) 600 MG 12 hr tablet Take 600 mg by mouth at bedtime.    [provider]  hydrocortisone  2.5 % ointment APPLY OINTMENT EXTERNALLY TWICE DAILY TO THE LOWER LEGS FOR UP TO 2 WEEKS MAXIMUM External; Duration: 30 Days    [provider]  levothyroxine  (SYNTHROID )  25 MCG tablet TAKE 1 TABLET BY MOUTH ONCE DAILY BEFORE BREAKFAST 12/08/23   Heilingoetter, Cassandra L, PA-C  lidocaine -prilocaine  (EMLA ) cream APPLY TO PORT-A-CATH 30 TO 60 MINUTES BEFORE TREATMENT 09/27/22   Sherrod Sherrod, MD  linaCLOtide (LINZESS PO) Take 1 tablet by mouth daily.    [provider]  loratadine  (CLARITIN ) 10 MG tablet Take 1 tablet by mouth once daily 12/04/23   [name], Robert S, MD  Multiple Vitamins-Minerals (HAIR SKIN AND NAILS FORMULA PO) Take 1 tablet by mouth daily.    [provider]  Omega-3 Fatty Acids ([employer] NATURAL FISH OIL) 1000 MG CAPS Take 2,000 mg by mouth at bedtime.     [provider]  ondansetron  (ZOFRAN -ODT) 8 MG disintegrating tablet Take 1 tablet (8 mg total) by mouth every 8 (eight) hours as needed for nausea or vomiting. 12/07/23   Heilingoetter, Cassandra L, PA-C  Oxycodone  HCl 10 MG TABS Take 10 mg by mouth 5 (five) times daily. 10/13/23   [provider]  polyethylene glycol powder (MIRALAX ) 17 GM/SCOOP powder Take 17 g by mouth 2 (two) times daily as needed for mild constipation. 06/25/23   Gonfa, Taye T, MD  prochlorperazine  (COMPAZINE ) 10 MG tablet Take 1 tablet (10 mg total) by mouth every 6 (six) hours as needed. Patient not taking: Reported on 10/29/2023 12/29/22   Heilingoetter, Cassandra L, PA-C  rizatriptan (MAXALT-MLT) 10 MG disintegrating tablet Take 10 mg by mouth as needed for migraine. May repeat in 2 hours if needed    [provider]  sertraline  (ZOLOFT ) 50 MG tablet Take 1 tablet by mouth daily.    [provider]  Tiotropium Bromide-Olodaterol (STIOLTO RESPIMAT ) 2.5-2.5 MCG/ACT AERS Inhale 2 puffs into the lungs daily. 11/03/23   Cobb, Katherine V, NP  triamcinolone cream (KENALOG) 0.1 % Apply 1 Application topically daily as needed (for nausea). 07/22/22   [provider]    Allergies: Carboplatin     Review of Systems  Skin:  Positive for wound.    Updated Vital Signs BP 138/80 (BP Location: Right Arm)   Pulse (!) 117   Temp 97.8 F (36.6 C)   Resp 20   SpO2 99%   Physical Exam Vitals and nursing note reviewed.  Constitutional:      General: She is not in acute distress.    Appearance: She is well-developed. She is not toxic-appearing.  HENT:     Head: Normocephalic and atraumatic.     Right Ear: External ear normal.     Left Ear: External ear normal.     Nose: Nose normal.  Eyes:     Extraocular Movements: Extraocular movements intact.     Conjunctiva/sclera: Conjunctivae normal.  Cardiovascular:     Rate and Rhythm: Normal rate and regular rhythm.     Heart sounds: No  murmur heard. Pulmonary:     Effort: Pulmonary effort is normal. No respiratory distress.     Breath sounds: Normal breath sounds.  Abdominal:     Palpations: Abdomen is soft.     Tenderness: There is no abdominal tenderness.  Musculoskeletal:        General: No swelling.     Cervical back: Neck supple.  Skin:    General: Skin is warm and dry.     Capillary Refill: Capillary refill takes less than 2 seconds.     Comments: Approximately 3 cm well-approximated laceration noted to the anterior, distal right lower extremity.  Wound is hemostatic on exam.  There is also a  very superficial approximately 1 cm laceration just adjacent to the larger laceration which is also hemostatic on exam.  Patient is neurovascularly intact and can plantar and dorsiflex her right foot without issue.  Neurological:     Mental Status: She is alert.  Psychiatric:        Mood and Affect: Mood normal.     (all labs ordered are listed, but only abnormal results are displayed) Labs Reviewed - No data to display  EKG: None  Radiology: DG Tibia/Fibula Right Result Date: 01/10/2024 EXAM: 2 VIEW(S) Xray of the right tibia and fibula 01/10/2024 10:32:00 PM COMPARISON: None available. CLINICAL HISTORY: lac lac FINDINGS: BONES AND JOINTS: No acute fracture. No malalignment. SOFT TISSUES: The soft tissues are unremarkable. IMPRESSION: 1. No significant abnormality. Electronically signed by: Franky Crease MD 01/10/2024 10:35 PM EST RP Workstation: [id number]     Procedures   Medications Ordered in the ED  Tdap (ADACEL ) injection 0.5 mL (0.5 mLs Intramuscular Given 01/10/24 2232)  amoxicillin -clavulanate (AUGMENTIN ) 875-125 MG per tablet 1 tablet (1 tablet Oral Given 01/10/24 2232)                                    Medical Decision Making  This patient presents to the ED for concern of dog bite differential diagnosis includes laceration, avulsion, abrasion    Imaging Studies ordered:  I ordered imaging  studies including right tib-fib x-ray I independently visualized and interpreted imaging which showed negative I agree with the radiologist interpretation   Medicines ordered and prescription drug management:  I ordered medication including Tdap and Augmentin     I have reviewed the patients home medicines and have made adjustments as needed   Problem List / ED Course:  Consider admission or further workup however patient's vital signs, physical exam, and imaging are reassuring.  Wound left open given concern for infection.  Patient started on Augmentin  for infection prophylaxis.  Patient given return precautions.  I feel patient is safe for discharge at this time.       Final diagnoses:  Dog bite, initial encounter    ED Discharge Orders          Ordered    amoxicillin -clavulanate (AUGMENTIN ) 875-125 MG tablet  Every 12 hours        01/10/24 2225               Francis Ileana SAILOR, PA-C 01/10/24 2243

## 2024-01-10 NOTE — ED Triage Notes (Signed)
 Laceration to right lower leg Walked between 2 dogs fighting,  Dogs UTD on shots Unknown tetanus

## 2024-01-10 NOTE — ED Notes (Signed)
Patient verbalizes understanding of discharge instructions. Opportunity for questioning and answers were provided. Armband removed by staff, pt discharged from ED. Ambulated out to lobby  

## 2024-01-10 NOTE — Discharge Instructions (Signed)
 Today you were seen for a dog bite to your right lower extremity.  Please pick up your antibiotic and take as prescribed.  Please follow-up with your primary care physician in the next several days for evaluation of wound healing.  Please return to the ED if you have red streaking up your leg, worsening pain, or puslike discharge.  Thank you for letting us  treat you today. After reviewing your imaging, I feel you are safe to go home. Please follow up with your PCP in the next several days and provide them with your records from this visit. Return to the Emergency Room if pain becomes severe or symptoms worsen.

## 2024-01-11 ENCOUNTER — Other Ambulatory Visit: Payer: Self-pay | Admitting: Emergency Medicine

## 2024-01-11 DIAGNOSIS — J3089 Other allergic rhinitis: Secondary | ICD-10-CM

## 2024-01-14 ENCOUNTER — Other Ambulatory Visit: Payer: Self-pay | Admitting: Internal Medicine

## 2024-01-14 DIAGNOSIS — C3411 Malignant neoplasm of upper lobe, right bronchus or lung: Secondary | ICD-10-CM

## 2024-01-15 ENCOUNTER — Encounter: Payer: Self-pay | Admitting: Physician Assistant

## 2024-01-15 ENCOUNTER — Encounter: Payer: Self-pay | Admitting: Internal Medicine

## 2024-01-18 ENCOUNTER — Inpatient Hospital Stay: Admitting: Internal Medicine

## 2024-01-18 ENCOUNTER — Inpatient Hospital Stay: Attending: Internal Medicine

## 2024-01-18 ENCOUNTER — Inpatient Hospital Stay: Admitting: Dietician

## 2024-01-18 VITALS — BP 112/64 | HR 100 | Temp 97.5°F | Resp 18 | Wt 97.0 lb

## 2024-01-18 DIAGNOSIS — Z5112 Encounter for antineoplastic immunotherapy: Secondary | ICD-10-CM | POA: Diagnosis present

## 2024-01-18 DIAGNOSIS — C778 Secondary and unspecified malignant neoplasm of lymph nodes of multiple regions: Secondary | ICD-10-CM | POA: Diagnosis not present

## 2024-01-18 DIAGNOSIS — C3490 Malignant neoplasm of unspecified part of unspecified bronchus or lung: Secondary | ICD-10-CM

## 2024-01-18 DIAGNOSIS — C3491 Malignant neoplasm of unspecified part of right bronchus or lung: Secondary | ICD-10-CM

## 2024-01-18 DIAGNOSIS — C3411 Malignant neoplasm of upper lobe, right bronchus or lung: Secondary | ICD-10-CM | POA: Diagnosis present

## 2024-01-18 DIAGNOSIS — C787 Secondary malignant neoplasm of liver and intrahepatic bile duct: Secondary | ICD-10-CM | POA: Insufficient documentation

## 2024-01-18 DIAGNOSIS — Z5111 Encounter for antineoplastic chemotherapy: Secondary | ICD-10-CM | POA: Diagnosis present

## 2024-01-18 LAB — CBC WITH DIFFERENTIAL (CANCER CENTER ONLY)
Abs Immature Granulocytes: 0.01 K/uL (ref 0.00–0.07)
Basophils Absolute: 0 K/uL (ref 0.0–0.1)
Basophils Relative: 1 %
Eosinophils Absolute: 0.2 K/uL (ref 0.0–0.5)
Eosinophils Relative: 6 %
HCT: 29.7 % — ABNORMAL LOW (ref 36.0–46.0)
Hemoglobin: 9.9 g/dL — ABNORMAL LOW (ref 12.0–15.0)
Immature Granulocytes: 0 %
Lymphocytes Relative: 17 %
Lymphs Abs: 0.5 K/uL — ABNORMAL LOW (ref 0.7–4.0)
MCH: 35.2 pg — ABNORMAL HIGH (ref 26.0–34.0)
MCHC: 33.3 g/dL (ref 30.0–36.0)
MCV: 105.7 fL — ABNORMAL HIGH (ref 80.0–100.0)
Monocytes Absolute: 0.4 K/uL (ref 0.1–1.0)
Monocytes Relative: 13 %
Neutro Abs: 1.9 K/uL (ref 1.7–7.7)
Neutrophils Relative %: 63 %
Platelet Count: 217 K/uL (ref 150–400)
RBC: 2.81 MIL/uL — ABNORMAL LOW (ref 3.87–5.11)
RDW: 14.6 % (ref 11.5–15.5)
WBC Count: 3 K/uL — ABNORMAL LOW (ref 4.0–10.5)
nRBC: 0 % (ref 0.0–0.2)

## 2024-01-18 LAB — CMP (CANCER CENTER ONLY)
ALT: 19 U/L (ref 0–44)
AST: 34 U/L (ref 15–41)
Albumin: 3.7 g/dL (ref 3.5–5.0)
Alkaline Phosphatase: 135 U/L — ABNORMAL HIGH (ref 38–126)
Anion gap: 12 (ref 5–15)
BUN: 8 mg/dL (ref 6–20)
CO2: 26 mmol/L (ref 22–32)
Calcium: 9.7 mg/dL (ref 8.9–10.3)
Chloride: 102 mmol/L (ref 98–111)
Creatinine: 1.17 mg/dL — ABNORMAL HIGH (ref 0.44–1.00)
GFR, Estimated: 53 mL/min — ABNORMAL LOW (ref >60)
Glucose, Bld: 133 mg/dL — ABNORMAL HIGH (ref 70–99)
Potassium: 3.5 mmol/L (ref 3.5–5.1)
Sodium: 140 mmol/L (ref 135–145)
Total Bilirubin: 0.4 mg/dL (ref 0.0–1.2)
Total Protein: 7.5 g/dL (ref 6.5–8.1)

## 2024-01-18 MED ORDER — SODIUM CHLORIDE 0.9 % IV SOLN: Freq: Once | INTRAVENOUS | Status: AC

## 2024-01-18 MED ORDER — ONDANSETRON HCL 4 MG/2ML IJ SOLN
8.0000 mg | Freq: Once | INTRAMUSCULAR | Status: AC
  Administered 2024-01-18: 11:00:00 8 mg via INTRAVENOUS
  Filled 2024-01-18: qty 4

## 2024-01-18 MED ORDER — SODIUM CHLORIDE 0.9 % IV SOLN
200.0000 mg | Freq: Once | INTRAVENOUS | Status: AC
  Administered 2024-01-18: 12:00:00 200 mg via INTRAVENOUS
  Filled 2024-01-18: qty 200

## 2024-01-18 MED ORDER — SODIUM CHLORIDE 0.9 % IV SOLN
400.0000 mg/m2 | Freq: Once | INTRAVENOUS | Status: AC
  Administered 2024-01-18: 12:00:00 600 mg via INTRAVENOUS
  Filled 2024-01-18: qty 20

## 2024-01-18 NOTE — Patient Instructions (Signed)
 [facility] - A DEPT OF [facility]  Discharge Instructions: Thank you for choosing [facility] to provide your oncology and hematology care.   If you have a lab appointment with the Cancer Center, please go directly to the Cancer Center and check in at the registration area.   Wear comfortable clothing and clothing appropriate for easy access to any Portacath or PICC line.   We strive to give you quality time with your provider. You may need to reschedule your appointment if you arrive late (15 or more minutes).  Arriving late affects you and other patients whose appointments are after yours.  Also, if you miss three or more appointments without notifying the office, you may be dismissed from the clinic at the providers discretion.      For prescription refill requests, have your pharmacy contact our office and allow 72 hours for refills to be completed.    Today you received the following chemotherapy and/or immunotherapy agents:  PEMEtrexed  Disodium (ALIMTA )  pembrolizumab  (KEYTRUDA )    To help prevent nausea and vomiting after your treatment, we encourage you to take your nausea medication as directed.  BELOW ARE SYMPTOMS THAT SHOULD BE REPORTED IMMEDIATELY: *FEVER GREATER THAN 100.4 F (38 C) OR HIGHER *CHILLS OR SWEATING *NAUSEA AND VOMITING THAT IS NOT CONTROLLED WITH YOUR NAUSEA MEDICATION *UNUSUAL SHORTNESS OF BREATH *UNUSUAL BRUISING OR BLEEDING *URINARY PROBLEMS (pain or burning when urinating, or frequent urination) *BOWEL PROBLEMS (unusual diarrhea, constipation, pain near the anus) TENDERNESS IN MOUTH AND THROAT WITH OR WITHOUT PRESENCE OF ULCERS (sore throat, sores in mouth, or a toothache) UNUSUAL RASH, SWELLING OR PAIN  UNUSUAL VAGINAL DISCHARGE OR ITCHING   Items with * indicate a potential emergency and should be followed up as soon as possible or go to the Emergency Department if any problems should occur.  Please  show the CHEMOTHERAPY ALERT CARD or IMMUNOTHERAPY ALERT CARD at check-in to the Emergency Department and triage nurse.  Should you have questions after your visit or need to cancel or reschedule your appointment, please contact [facility] - [facility] DE[facility]  Dept: [phone]  and follow the prompts.  Office hours are 8:00 a.m. to 4:30 p.m. Monday - Friday. Please note that voicemails left after 4:00 p.m. may not be returned until the following business day.  We are closed weekends and major holidays. You have access to a nurse at all times for urgent questions. Please call the main number to the clinic Dept: [phone] and follow the prompts.   For any non-urgent questions, you may also contact your provider using MyChart. We now offer e-Visits for anyone 78 and older to request care online for non-urgent symptoms. For details visit [email or web address].   Also download the MyChart app! Go to the app store, search MyChart, open the app, select [facility], and log in with your MyChart username and password.

## 2024-01-18 NOTE — Progress Notes (Signed)
 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 She is currently receiving maintenance keytruda  + alimta . Patient under the care of Dr. Sherrod   Met with patient in infusion. Reports doing well overall. Noted patient seen in ED for evaluation of dog bite last week. Wound to lower extremity continues to heal. Says she has a follow-up appointment this afternoon with PCP. Appetite is the same. Patient did receive shipment of Ensure. She requested strawberry, but got chocolate. She can drink this with use of straw.   Medications: reviewed   Labs: glucose 133, Cr 1.17  Anthropometrics: Wt 97 lb 0.8 oz today   11/26 - 98 lb 6.4 oz 10/15 - 97 lb 4.8 oz 9/27 - 96 lb 1.9 oz   Estimated Energy Needs  Kcals: 1575-1800 Protein: 58-67 Fluid: 1.5 L  NUTRITION DIAGNOSIS: Unintended wt loss - stable   INTERVENTION:  Continue drinking Ensure Plus/equivalent, recommend 2/day     MONITORING, EVALUATION, GOAL: wt trends, intake   NEXT VISIT: Wednesday January 28 during infusion

## 2024-01-18 NOTE — Progress Notes (Signed)
 [name] [phone]   [phone]  OFFICE PROGRESS NOTE  Julie Nicholson, [place], NP [address] [place] [address]  DIAGNOSIS: Recurrent/metastatic non-small cell lung cancer initially diagnosed as stage IIIB  (T1b, N3, M0) non-small cell lung cancer, adenocarcinoma she presented with right upper lobe nodule in addition to bulky right hilar, mediastinal, and left supraclavicular lymphadenopathy diagnosed in November 2022.  The patient had evidence for disease recurrence in the mediastinal and right supraclavicular lymphadenopathy in August 2023.  The patient had evidence of metastatic disease in February 2024 with several supraclavicular, thoracic and precarinal lymphadenopathy as well as upper abdominal lymph nodes and small liver lesion.  DETECTED ALTERATION(S) / BIOMARKER(S) % CFDNA OR AMPLIFICATION ASSOCIATED FDA-APPROVED THERAPIES CLINICAL TRIAL AVAILABILITY [id number] ND 0.5 5 50 100 4.7%  RHOAG17E ND 0.5 5 50 100 1.8%  CTNNB1S37C ND 0.5 5 50 100 1.9%  BIOMARKER ADDITIONAL DETAILS Tumor Mutational Burden (TMB) 19.02 mut/Mb MSI Status Stable (MSS) PD-L1 Tumor Proportion Score (TPS)* <1%  Biomarker Findings By [facility] on 05/06/2022 Tumor Mutational Burden - 17 Muts/Mb Microsatellite status - MS-Stable Genomic Findings For a complete list of the genes assayed, please refer to the Appendix. CTNNB1 S37C CRKL amplification MAP2K4 loss exons 2-11 TP53 V143A 8 Disease relevant genes with no reportable alterations: ALK, BRAF, EGFR, ERBB2, KRAS, MET, RET, ROS1  PDL1 0%   PRIOR THERAPY:  1) Concurrent chemoradiation with carboplatin  for an AUC of 2 and paclitaxel  45 mg per metered squared.  First dose on 01/05/2021.  Status post 7 cycles of treatment.  Last dose was given February 16, 2021. 2) Consolidation immunotherapy with Imfinzi  1500 Mg IV every 4 weeks.  First dose March 25, 2021.  Status post 3 cycles.  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 3) Palliative radiotherapy to the enlarging right supraclavicular lymphadenopathy under the care of Dr. Shannon expected to be completed on October 28, 2021.   CURRENT THERAPY: Palliative systemic chemotherapy with carboplatin  for AUC of 5, Alimta  500 Mg/M2 and Keytruda  200 Mg IV every 3 weeks.  First dose April 05, 2022.  Status post 28 cycles.  Starting from cycle #3 her dose of carboplatin  was reduced to AUC of 4 and Alimta  400 Mg/M2.  Starting from cycle #5 the patient will be on maintenance treatment with Alimta  and Keytruda  every 3 weeks.  INTERVAL HISTORY: Julie Nicholson 58 y.o. female returns to the clinic today for follow-up visit. Discussed the use of AI scribe software for clinical note transcription with the patient, who gave verbal consent to proceed.  History of Present Illness Julie Nicholson is a 58 year old female with metastatic lung adenocarcinoma presenting for evaluation prior to cycle 29 of maintenance Alimta  and Keytruda .  She is receiving maintenance Alimta  and Keytruda  every three weeks and has completed 28 cycles. She denies new or ongoing adverse effects from her current regimen and reports no bothersome symptoms at this time. She continues to experience intermittent night sweats, which she describes as occasional, but denies recent weight loss and cervical lymphadenopathy.  She sustained a leg laceration secondary to a dog bite at home, for which she sought hospital care. The wound was not sutured due to her oncologic history and is healing well without signs of infection or current symptoms.    MEDICAL HISTORY: Past Medical History:  Diagnosis Date   Abdominal discomfort    Acute sinusitis 11/11/2021   Adenocarcinoma of right lung, stage  4 (HCC) 12/18/2020   Allergic rhinitis 05/08/2021   Antineoplastic chemotherapy induced pancytopenia 02/23/2021   Aortic atherosclerosis 06/24/2023    Cancer (HCC)    Chronic cholecystitis 03/13/2013   Chronic headaches    due to allergies, sinus   Chronic venous hypertension (idiopathic) with other complications of bilateral lower extremity    Constipation 06/23/2023   COPD (chronic obstructive pulmonary disease) (HCC)    per 2012 chest xray   pt states she doesn not have this now (04/10/2013)   COVID-19 virus infection 02/24/2021   Deviated nasal septum    Dyspnea 04/07/2021   Encounter for antineoplastic chemotherapy 12/18/2020   Encounter for antineoplastic immunotherapy 11/17/2022   Eustachian tube dysfunction    Former smoker 07/20/2023   GERD (gastroesophageal reflux disease)    occasional uses Tums / Rolaids   Goals of care, counseling/discussion 03/18/2021   Hearing loss    right ear   HFrEF (heart failure with reduced ejection fraction) (HCC) 07/13/2023   High cholesterol    History of radiation therapy    right lung 01/07/2021-02/19/2021  Dr Lynwood Nasuti   History of radiation therapy    Lymph nodes of head,face,neck- 12/23/22-01/07/23- Dr. 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  MEDICATIONS:  Current Outpatient Medications  Medication Sig Dispense Refill   acetaminophen  (TYLENOL ) 500 MG tablet Take 500 mg by  mouth every 6 (six) hours as needed for moderate pain (pain score 4-6).     albuterol  (PROVENTIL ) (2.5 MG/3ML) 0.083% nebulizer solution Take 3 mLs (2.5 mg total) by nebulization every 6 (six) hours as needed. 120 mL 5   albuterol  (VENTOLIN  HFA) 108 (90 Base) MCG/ACT inhaler Inhale 1-2 puffs into the lungs every 6 (six) hours as needed for wheezing or shortness of breath. 18 g 3   amoxicillin -clavulanate (AUGMENTIN ) 875-125 MG tablet Take 1 tablet by mouth every 12 (twelve) hours. 19 tablet 0   aspirin  EC 81 MG tablet Take 81 mg by mouth at bedtime.     atorvastatin  (LIPITOR) 40 MG tablet Take 40 mg by mouth at bedtime.     azelastine  (ASTELIN ) 0.1 % nasal spray Place 1 spray into both nostrils 2 (two) times daily as needed. Use in each nostril as directed 30 mL 5   benzonatate  (TESSALON ) 200 MG capsule Take 1 capsule (200 mg total) by mouth 3 (three) times daily as needed. 30 capsule 0   Biotin 5000 MCG TABS Take 10,000 mcg by mouth at bedtime.     buPROPion  (WELLBUTRIN  XL) 150 MG 24 hr tablet Take 150 mg by mouth at bedtime.     Cyanocobalamin  (VITAMIN B-12) 5000 MCG TBDP Take 5,000 mcg by mouth at bedtime.     docusate sodium  (COLACE) 100 MG capsule Take 200 mg  by mouth at bedtime.     famotidine  (PEPCID ) 20 MG tablet Take 1 tablet (20 mg total) by mouth at bedtime. 60 tablet 5   feeding supplement (ENSURE ENLIVE / ENSURE PLUS) LIQD Ensure Plus HP/equivalent - drink 237 ml BID  12   fluticasone  (FLONASE ) 50 MCG/ACT nasal spray Place 2 sprays into both nostrils daily. (Patient taking differently: Place 2 sprays into both nostrils daily as needed for allergies.) 16 g 5   folic acid  (FOLVITE ) 1 MG tablet Take 1 tablet by mouth once daily 30 tablet 0   furosemide  (LASIX ) 20 MG tablet Take 20 mg by mouth as needed for fluid or edema. (Patient not taking: Reported on 10/29/2023)     guaiFENesin  (MUCINEX ) 600 MG 12 hr tablet Take 600 mg by mouth at bedtime.     hydrocortisone  2.5 % ointment APPLY  OINTMENT EXTERNALLY TWICE DAILY TO THE LOWER LEGS FOR UP TO 2 WEEKS MAXIMUM External; Duration: 30 Days     levothyroxine  (SYNTHROID ) 25 MCG tablet TAKE 1 TABLET BY MOUTH ONCE DAILY BEFORE BREAKFAST 30 tablet 0   lidocaine -prilocaine  (EMLA ) cream APPLY TO PORT-A-CATH 30 TO 60 MINUTES BEFORE TREATMENT 30 g 0   linaCLOtide (LINZESS PO) Take 1 tablet by mouth daily.     loratadine  (CLARITIN ) 10 MG tablet Take 1 tablet by mouth daily 30 tablet 0   Multiple Vitamins-Minerals (HAIR SKIN AND NAILS FORMULA PO) Take 1 tablet by mouth daily.     Omega-3 Fatty Acids ([employer] NATURAL FISH OIL) 1000 MG CAPS Take 2,000 mg by mouth at bedtime.     ondansetron  (ZOFRAN -ODT) 8 MG disintegrating tablet Take 1 tablet (8 mg total) by mouth every 8 (eight) hours as needed for nausea or vomiting. 30 tablet 2   Oxycodone  HCl 10 MG TABS Take 10 mg by mouth 5 (five) times daily.     polyethylene glycol powder (MIRALAX ) 17 GM/SCOOP powder Take 17 g by mouth 2 (two) times daily as needed for mild constipation. 255 g 1   prochlorperazine  (COMPAZINE ) 10 MG tablet Take 1 tablet (10 mg total) by mouth every 6 (six) hours as needed. (Patient not taking: Reported on 10/29/2023) 30 tablet 2   rizatriptan (MAXALT-MLT) 10 MG disintegrating tablet Take 10 mg by mouth as needed for migraine. May repeat in 2 hours if needed     sertraline  (ZOLOFT ) 50 MG tablet Take 1 tablet by mouth daily.     Tiotropium Bromide-Olodaterol (STIOLTO RESPIMAT ) 2.5-2.5 MCG/ACT AERS Inhale 2 puffs into the lungs daily. 12 g 3   triamcinolone cream (KENALOG) 0.1 % Apply 1 Application topically daily as needed (for nausea).     No current facility-administered medications for this visit.    SURGICAL HISTORY:  Past Surgical History:  Procedure Laterality Date   ABDOMINAL HYSTERECTOMY  1995   tx endometriosis, both ovaries removed   ADENOIDECTOMY     APPENDECTOMY  late 1990's   BIOPSY  02/26/2021   Procedure: BIOPSY;  Surgeon: Teressa Toribio SQUIBB, MD;   Location: [facility] ENDOSCOPY;  Service: Endoscopy;;   BRONCHIAL BRUSHINGS  12/15/2020   Procedure: BRONCHIAL BRUSHINGS;  Surgeon: Shelah Lamar RAMAN, MD;  Location: [facility] END[facility]COPY;  Service: Cardiopulmonary;;   BRONCHIAL NEEDLE ASPIRATION BIOPSY  12/15/2020   Procedure: BRONCHIAL NEEDLE ASPIRATION BIOPSIES;  Surgeon: Shelah Lamar RAMAN, MD;  Location: [facility] ENDOSCOPY;  Service: Cardiopulmonary;;   CHOLECYSTECTOMY  04/10/2013   CHOLECYSTECTOMY N/A 04/10/2013   Procedure: LAPAROSCOPIC CHOLECYSTECTOMY WITH INTRAOPERATIVE CHOLANGIOGRAM;  Surgeon: Krystal JINNY Russell, MD;  Location:  [facility] OR;  Service: General;  Laterality: N/A;   ELECTROMAGNETIC NAVIGATION BROCHOSCOPY  12/15/2020   Procedure: ELECTROMAGNETIC NAVIGATION BRONCHOSCOPY;  Surgeon: Shelah Lamar RAMAN, MD;  Location: [facility] ENDOSCOPY;  Service: Cardiopulmonary;;   ESOPHAGOGASTRODUODENOSCOPY (EGD) WITH PROPOFOL  N/A 02/26/2021   Procedure: ESOPHAGOGASTRODUODENOSCOPY (EGD) WITH PROPOFOL ;  Surgeon: Teressa Toribio SQUIBB, MD;  Location: [facility] ENDOSCOPY;  Service: Endoscopy;  Laterality: N/A;   EUS N/A 02/16/2013   Procedure: UPPER ENDOSCOPIC ULTRASOUND (EUS) LINEAR;  Surgeon: Toribio SQUIBB Teressa, MD;  Location: [facility] ENDOSCOPY;  Service: Endoscopy;  Laterality: N/A;  radial linear   IR IMAGING GUIDED PORT INSERTION  06/30/2022   KNEE ARTHROSCOPY Right 1980's   cartilage OR   LAPAROSCOPIC ENDOMETRIOSIS FULGURATION  1980's   MYRINGOTOMY WITH TUBE PLACEMENT Right 07/13/2018   Procedure: MYRINGOTOMY WITH TUBE PLACEMENT;  Surgeon: Edda Mt, MD;  Location: [facility];  Service: ENT;  Laterality: Right;   MYRINGOTOMY WITH TUBE PLACEMENT Right 06/04/2021   Procedure: MYRINGOTOMY WITH BUTTERFLY TUBE PLACEMENT;  Surgeon: Edda Mt, MD;  Location: [facility];  Service: ENT;  Laterality: Right;   NASOPHARYNGOSCOPY EUSTATION TUBE BALLOON DILATION Right 07/13/2018   Procedure: NASOPHARYNGOSCOPY EUSTATION TUBE BALLOON DILATION;  Surgeon: Edda Mt, MD;  Location:  [facility];  Service: ENT;  Laterality: Right;   TONSILLECTOMY AND ADENOIDECTOMY  ~ 1980   adenoidectomy   TUBAL LIGATION  ~ 1987   TURBINATE REDUCTION Right 07/13/2018   Procedure: OUTFRACTURE TURBINATE;  Surgeon: Edda Mt, MD;  Location: [facility];  Service: ENT;  Laterality: Right;   TYMPANOSTOMY TUBE PLACEMENT     VIDEO BRONCHOSCOPY WITH ENDOBRONCHIAL ULTRASOUND N/A 12/15/2020   Procedure: ROBOTIC VIDEO BRONCHOSCOPY WITH ENDOBRONCHIAL ULTRASOUND;  Surgeon: Shelah Lamar RAMAN, MD;  Location: [facility] ENDOSCOPY;  Service: Cardiopulmonary;  Laterality: N/A;   WRIST SURGERY Left    w/plate    REVIEW OF SYSTEMS:  Constitutional: positive for fatigue Eyes: negative Ears, nose, mouth, throat, and face: negative Respiratory: negative Cardiovascular: negative Gastrointestinal: negative Genitourinary:negative Integument/breast: negative Hematologic/lymphatic: negative Musculoskeletal:negative Neurological: negative Behavioral/Psych: negative Endocrine: negative Allergic/Immunologic: negative   PHYSICAL EXAMINATION: General appearance: alert, cooperative, fatigued, and no distress Head: Normocephalic, without obvious abnormality, atraumatic Neck: moderate anterior cervical adenopathy, no JVD, supple, symmetrical, trachea midline, and thyroid  not enlarged, symmetric, no tenderness/mass/nodules Lymph nodes: Cervical, supraclavicular, and axillary nodes normal. Resp: clear to auscultation bilaterally Back: symmetric, no curvature. ROM normal. No CVA tenderness. Cardio: regular rate and rhythm, S1, S2 normal, no murmur, click, rub or gallop GI: soft, non-tender; bowel sounds normal; no masses,  no organomegaly Extremities: extremities normal, atraumatic, no cyanosis or edema Neurologic: Alert and oriented X 3, normal strength and tone. Normal symmetric reflexes. Normal coordination and gait  ECOG PERFORMANCE STATUS: 1 - Symptomatic but completely ambulatory  Blood  pressure 112/64, pulse 100, temperature (!) 97.5 F (36.4 C), resp. rate 18, weight 97 lb 0.8 oz (44 kg), SpO2 100%.  LABORATORY DATA: Lab Results  Component Value Date   WBC 5.8 12/28/2023   HGB 10.2 (L) 12/28/2023   HCT 30.3 (L) 12/28/2023   MCV 106.3 (H) 12/28/2023   PLT 235 12/28/2023      Chemistry      Component Value Date/Time   NA 141 12/28/2023 0841   K 3.4 (L) 12/28/2023 0841   CL 104 12/28/2023 0841   CO2 26 12/28/2023 0841   BUN 12 12/28/2023 0841   CREATININE 1.02 (H) 12/28/2023 0841      Component Value Date/Time   CALCIUM  9.5 12/28/2023  0841   ALKPHOS 138 (H) 12/28/2023 0841   AST 27 12/28/2023 0841   ALT 15 12/28/2023 0841   BILITOT 0.4 12/28/2023 0841       RADIOGRAPHIC STUDIES: CT CHEST ABDOMEN PELVIS W CONTRAST Result Date: 01/12/2024 CLINICAL DATA:  Metastatic lung cancer restaging * Tracking Code: BO * EXAM: CT CHEST, ABDOMEN, AND PELVIS WITH CONTRAST TECHNIQUE: Multidetector CT imaging of the chest, abdomen and pelvis was performed following the standard protocol during bolus administration of intravenous contrast. RADIATION DOSE REDUCTION: This exam was performed according to the departmental dose-optimization program which includes automated exposure control, adjustment of the mA and/or kV according to patient size and/or use of iterative reconstruction technique. CONTRAST:  70mL OMNIPAQUE  IOHEXOL  300 MG/ML SOLN additional oral enteric contrast COMPARISON:  11/10/2023 FINDINGS: CT CHEST FINDINGS Cardiovascular: Right chest port catheter. Normal heart size. No pericardial effusion. Mediastinum/Nodes: Unchanged treated soft tissue throughout the mediastinum and right hilum without discretely measurable lymph nodes (series 2, image 22). Circumferential wall thickening of the esophagus. Thyroid  and trachea demonstrate no significant findings. Lungs/Pleura: Severe emphysema. Unchanged post treatment appearance of the perihilar right lung with dense fibrosis and  consolidation (series 3, image 56). No pleural effusion or pneumothorax. Musculoskeletal: No chest wall abnormality. No acute osseous findings. CT ABDOMEN PELVIS FINDINGS Hepatobiliary: No solid liver abnormality is seen. Cholecystectomy. Mild postoperative biliary ductal dilatation. Pancreas: Unremarkable. No pancreatic ductal dilatation or surrounding inflammatory changes. Spleen: Normal in size without significant abnormality. Adrenals/Urinary Tract: Adrenal glands are unremarkable. Kidneys are normal, without renal calculi, solid lesion, or hydronephrosis. Bladder is unremarkable. Stomach/Bowel: Stomach is within normal limits. Appendectomy. No evidence of bowel wall thickening, distention, or inflammatory changes. Moderate burden of stool. Vascular/Lymphatic: Aortic atherosclerosis. Continued enlargement of left retroperitoneal lymph nodes measuring up to 1.7 x 1.1 cm, previously 1.3 x 0.7 cm (series 2, image 64) and periaortic soft tissue, now measuring 2.6 x 1.6 cm, previously 1.5 x 0.9 cm when measured similarly (series 2, image 60). Reproductive: Hysterectomy. Other: No abdominal wall hernia or abnormality. No ascites. Musculoskeletal: No acute osseous findings. IMPRESSION: 1. Unchanged post treatment appearance of the perihilar right lung with dense fibrosis and consolidation. 2. Unchanged treated soft tissue throughout the mediastinum and right hilum without discretely measurable lymph nodes. 3. Continued enlargement of left retroperitoneal lymph nodes and periaortic soft tissue, consistent with worsened nodal metastatic disease. 4. Circumferential wall thickening of the esophagus, probably reflecting radiation esophagitis. 5. Emphysema. Aortic Atherosclerosis (ICD10-I70.0) and Emphysema (ICD10-J43.9). Electronically Signed   By: Marolyn JONETTA Jaksch M.D.   On: 01/12/2024 05:20   DG Tibia/Fibula Right Result Date: 01/10/2024 EXAM: 2 VIEW(S) Xray of the right tibia and fibula 01/10/2024 10:32:00 PM COMPARISON:  None available. CLINICAL HISTORY: lac lac FINDINGS: BONES AND JOINTS: No acute fracture. No malalignment. SOFT TISSUES: The soft tissues are unremarkable. IMPRESSION: 1. No significant abnormality. Electronically signed by: Franky Crease MD 01/10/2024 10:35 PM EST RP Workstation: [id number]     ASSESSMENT AND PLAN: This is a very pleasant 58 years old white female with stage IIIB (T1b, N3, M0) non-small cell lung cancer, adenocarcinoma diagnosed in November 2022 with no actionable mutation and negative PD-L1 expression. The patient completed a course of concurrent chemoradiation with weekly carboplatin  for AUC of 2 and paclitaxel  45 Mg/M2 status post 7 cycles.  She has been tolerating her treatment well except for the mild odynophagia and skin burns. She was also recently admitted to the hospital complaining of dysphagia and odynophagia secondary to radiation induced esophagitis.  She is feeling much better but continues to have residual dysphagia.  She is followed by gastroenterology and was seen by Dr. Teressa during her hospitalization. Her scan showed improvement of her disease. I recommended for the patient treatment with consolidation immunotherapy with Imfinzi  1500 Mg IV every 4 weeks.  Status post 3 cycles.  Last dose was given in April 2023.  Her treatment was discontinued secondary to suspicious immunotherapy mediated pneumonitis with significant shortness of breath at that time and she was treated with a tapered dose of prednisone . Unfortunately her scan showed evidence for disease recurrence with enlargement of lower right cervical lymph nodes as well as mediastinal lymphadenopathy.  She has palpable right cervical lymphadenopathy. She had a PET scan at that time and unfortunately showed significant enlargement of mediastinal and low right cervical lymph nodes consistent with worsening nodal metastatic disease but there was improvement of the heterogeneous airspace disease and consolidation  throughout the right upper lobe consistent with improved radiation pneumonitis and developing radiation fibrosis. The patient underwent ultrasound-guided core biopsy of the right supraclavicular lymph nodes but the final pathology showed necrotic tumor cells with complete coagulative necrosis with focal fibrous tissue and histiocytic reaction.  She also had MRI of the brain that showed no evidence of metastatic disease to the brain. The patient was seen by Dr. Shannon and started palliative radiotherapy to the enlarging right supraclavicular lymphadenopathy.  This was completed on October 28, 2021. The patient was found to have metastatic disease in February 2024 with several lymphadenopathy in the chest as well as supraclavicular, upper abdomen as well as suspicious small liver metastasis. 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  I will send the tissue biopsy to [facility] for molecular studies. She is currently undergoing systemic chemotherapy with carboplatin  for AUC of 5, Alimta  500 Mg/M2 and Keytruda  200 Mg IV every 3 weeks status post 28 cycles.  Starting from cycle #3, I reduced her dose of carboplatin  to AUC of 4 and Alimta  400 Mg/M2.  She has been tolerating her treatment fairly well. She had repeat CT scan of the chest, abdomen and pelvis performed recently.  I personally independently reviewed the scan and discussed the result with the patient today.  Her scan showed no concerning findings for disease progression. Assessment and Plan Assessment & Plan Stage II lung adenocarcinoma on maintenance therapy She is undergoing maintenance therapy for stage II lung adenocarcinoma with Alimta  and Keytruda , having completed 28 cycles. Imaging demonstrates stable disease without progression or new metastatic lesions. She  denies significant adverse effects, except for intermittent night sweats, and reports no other B symptoms. Hematologic parameters are sufficient for continued therapy. Her clinical status is well-managed. - Reviewed imaging confirming stable disease. - Assessed hematologic parameters and confirmed adequacy for ongoing therapy. - Administered cycle 29 of maintenance therapy with Alimta  and Keytruda . - Discussed continuation of immunotherapy (Keytruda ) up to 35 cycles and Alimta  as long as clinically effective. She was advised to call immediately if she has any concerning symptoms in the interval. The patient voices understanding of current disease status and treatment options and is in agreement with the current care plan.  All questions were answered. The patient knows to call the clinic with any problems, questions or concerns. We can certainly see the patient much sooner if necessary.  The total time spent in the appointment was  30 minutes.  Disclaimer: This note was dictated with voice recognition software. Similar sounding words can inadvertently be transcribed and may not be corrected upon review.

## 2024-02-03 ENCOUNTER — Other Ambulatory Visit: Payer: Self-pay | Admitting: Physician Assistant

## 2024-02-03 ENCOUNTER — Other Ambulatory Visit: Payer: Self-pay | Admitting: Emergency Medicine

## 2024-02-03 DIAGNOSIS — E039 Hypothyroidism, unspecified: Secondary | ICD-10-CM

## 2024-02-03 DIAGNOSIS — J3089 Other allergic rhinitis: Secondary | ICD-10-CM

## 2024-02-04 ENCOUNTER — Encounter: Payer: Self-pay | Admitting: Physician Assistant

## 2024-02-04 ENCOUNTER — Encounter: Payer: Self-pay | Admitting: Internal Medicine

## 2024-02-05 ENCOUNTER — Other Ambulatory Visit: Payer: Self-pay

## 2024-02-08 ENCOUNTER — Inpatient Hospital Stay

## 2024-02-08 ENCOUNTER — Inpatient Hospital Stay: Attending: Internal Medicine

## 2024-02-08 ENCOUNTER — Inpatient Hospital Stay: Attending: Internal Medicine | Admitting: Internal Medicine

## 2024-02-08 VITALS — BP 112/71 | HR 113 | Temp 98.0°F | Resp 17 | Ht 61.0 in | Wt 94.8 lb

## 2024-02-08 VITALS — BP 119/65 | HR 96 | Temp 98.1°F | Resp 18

## 2024-02-08 DIAGNOSIS — Z5112 Encounter for antineoplastic immunotherapy: Secondary | ICD-10-CM | POA: Insufficient documentation

## 2024-02-08 DIAGNOSIS — R630 Anorexia: Secondary | ICD-10-CM | POA: Diagnosis not present

## 2024-02-08 DIAGNOSIS — D6481 Anemia due to antineoplastic chemotherapy: Secondary | ICD-10-CM | POA: Insufficient documentation

## 2024-02-08 DIAGNOSIS — R633 Feeding difficulties, unspecified: Secondary | ICD-10-CM | POA: Diagnosis not present

## 2024-02-08 DIAGNOSIS — R634 Abnormal weight loss: Secondary | ICD-10-CM | POA: Insufficient documentation

## 2024-02-08 DIAGNOSIS — C3490 Malignant neoplasm of unspecified part of unspecified bronchus or lung: Secondary | ICD-10-CM | POA: Diagnosis not present

## 2024-02-08 DIAGNOSIS — Z5111 Encounter for antineoplastic chemotherapy: Secondary | ICD-10-CM | POA: Diagnosis present

## 2024-02-08 DIAGNOSIS — C3411 Malignant neoplasm of upper lobe, right bronchus or lung: Secondary | ICD-10-CM | POA: Insufficient documentation

## 2024-02-08 DIAGNOSIS — R000 Tachycardia, unspecified: Secondary | ICD-10-CM | POA: Insufficient documentation

## 2024-02-08 DIAGNOSIS — Z79620 Long term (current) use of immunosuppressive biologic: Secondary | ICD-10-CM | POA: Insufficient documentation

## 2024-02-08 DIAGNOSIS — Z7982 Long term (current) use of aspirin: Secondary | ICD-10-CM | POA: Insufficient documentation

## 2024-02-08 DIAGNOSIS — C3491 Malignant neoplasm of unspecified part of right bronchus or lung: Secondary | ICD-10-CM

## 2024-02-08 DIAGNOSIS — T50995D Adverse effect of other drugs, medicaments and biological substances, subsequent encounter: Secondary | ICD-10-CM | POA: Diagnosis not present

## 2024-02-08 LAB — CBC WITH DIFFERENTIAL (CANCER CENTER ONLY)
Abs Immature Granulocytes: 0 K/uL (ref 0.00–0.07)
Basophils Absolute: 0 K/uL (ref 0.0–0.1)
Basophils Relative: 0 %
Eosinophils Absolute: 0.1 K/uL (ref 0.0–0.5)
Eosinophils Relative: 3 %
HCT: 28.5 % — ABNORMAL LOW (ref 36.0–46.0)
Hemoglobin: 9.5 g/dL — ABNORMAL LOW (ref 12.0–15.0)
Immature Granulocytes: 0 %
Lymphocytes Relative: 23 %
Lymphs Abs: 0.7 K/uL (ref 0.7–4.0)
MCH: 35.4 pg — ABNORMAL HIGH (ref 26.0–34.0)
MCHC: 33.3 g/dL (ref 30.0–36.0)
MCV: 106.3 fL — ABNORMAL HIGH (ref 80.0–100.0)
Monocytes Absolute: 0.3 K/uL (ref 0.1–1.0)
Monocytes Relative: 12 %
Neutro Abs: 1.8 K/uL (ref 1.7–7.7)
Neutrophils Relative %: 62 %
Platelet Count: 231 K/uL (ref 150–400)
RBC: 2.68 MIL/uL — ABNORMAL LOW (ref 3.87–5.11)
RDW: 14.6 % (ref 11.5–15.5)
WBC Count: 2.9 K/uL — ABNORMAL LOW (ref 4.0–10.5)
nRBC: 0 % (ref 0.0–0.2)

## 2024-02-08 LAB — CMP (CANCER CENTER ONLY)
ALT: 20 U/L (ref 0–44)
AST: 34 U/L (ref 15–41)
Albumin: 3.8 g/dL (ref 3.5–5.0)
Alkaline Phosphatase: 138 U/L — ABNORMAL HIGH (ref 38–126)
Anion gap: 13 (ref 5–15)
BUN: 13 mg/dL (ref 6–20)
CO2: 26 mmol/L (ref 22–32)
Calcium: 10.1 mg/dL (ref 8.9–10.3)
Chloride: 101 mmol/L (ref 98–111)
Creatinine: 1.22 mg/dL — ABNORMAL HIGH (ref 0.44–1.00)
GFR, Estimated: 51 mL/min — ABNORMAL LOW
Glucose, Bld: 141 mg/dL — ABNORMAL HIGH (ref 70–99)
Potassium: 3.3 mmol/L — ABNORMAL LOW (ref 3.5–5.1)
Sodium: 140 mmol/L (ref 135–145)
Total Bilirubin: 0.4 mg/dL (ref 0.0–1.2)
Total Protein: 8 g/dL (ref 6.5–8.1)

## 2024-02-08 LAB — TSH: TSH: 4.64 u[IU]/mL — ABNORMAL HIGH (ref 0.350–4.500)

## 2024-02-08 MED ORDER — ONDANSETRON HCL 4 MG/2ML IJ SOLN
8.0000 mg | Freq: Once | INTRAMUSCULAR | Status: AC
  Administered 2024-02-08: 8 mg via INTRAVENOUS
  Filled 2024-02-08: qty 4

## 2024-02-08 MED ORDER — SODIUM CHLORIDE 0.9 % IV SOLN: Freq: Once | INTRAVENOUS | Status: AC

## 2024-02-08 MED ORDER — SODIUM CHLORIDE 0.9 % IV SOLN
200.0000 mg | Freq: Once | INTRAVENOUS | Status: AC
  Administered 2024-02-08: 200 mg via INTRAVENOUS
  Filled 2024-02-08: qty 200

## 2024-02-08 MED ORDER — CYANOCOBALAMIN 1000 MCG/ML IJ SOLN
1000.0000 ug | Freq: Once | INTRAMUSCULAR | Status: AC
  Administered 2024-02-08: 1000 ug via INTRAMUSCULAR
  Filled 2024-02-08: qty 1

## 2024-02-08 MED ORDER — SODIUM CHLORIDE 0.9 % IV SOLN
400.0000 mg/m2 | Freq: Once | INTRAVENOUS | Status: AC
  Administered 2024-02-08: 600 mg via INTRAVENOUS
  Filled 2024-02-08: qty 20

## 2024-02-08 NOTE — Patient Instructions (Addendum)
 [facility] - A DEPT OF [facility]  Discharge Instructions: Thank you for choosing [facility] to provide your oncology and hematology care.   If you have a lab appointment with the Cancer Center, please go directly to the Cancer Center and check in at the registration area.   Wear comfortable clothing and clothing appropriate for easy access to any Portacath or PICC line.   We strive to give you quality time with your provider. You may need to reschedule your appointment if you arrive late (15 or more minutes).  Arriving late affects you and other patients whose appointments are after yours.  Also, if you miss three or more appointments without notifying the office, you may be dismissed from the clinic at the providers discretion.      For prescription refill requests, have your pharmacy contact our office and allow 72 hours for refills to be completed.    Today you received the following chemotherapy and/or immunotherapy agents:  PEMEtrexed  Disodium (ALIMTA )  pembrolizumab  (KEYTRUDA )    To help prevent nausea and vomiting after your treatment, we encourage you to take your nausea medication as directed.  BELOW ARE SYMPTOMS THAT SHOULD BE REPORTED IMMEDIATELY: *FEVER GREATER THAN 100.4 F (38 C) OR HIGHER *CHILLS OR SWEATING *NAUSEA AND VOMITING THAT IS NOT CONTROLLED WITH YOUR NAUSEA MEDICATION *UNUSUAL SHORTNESS OF BREATH *UNUSUAL BRUISING OR BLEEDING *URINARY PROBLEMS (pain or burning when urinating, or frequent urination) *BOWEL PROBLEMS (unusual diarrhea, constipation, pain near the anus) TENDERNESS IN MOUTH AND THROAT WITH OR WITHOUT PRESENCE OF ULCERS (sore throat, sores in mouth, or a toothache) UNUSUAL RASH, SWELLING OR PAIN  UNUSUAL VAGINAL DISCHARGE OR ITCHING   Items with * indicate a potential emergency and should be followed up as soon as possible or go to the Emergency Department if any problems should occur.  Please  show the CHEMOTHERAPY ALERT CARD or IMMUNOTHERAPY ALERT CARD at check-in to the Emergency Department and triage nurse.  Should you have questions after your visit or need to cancel or reschedule your appointment, please contact [facility] - [facility] DE[facility]  Dept: [phone]  and follow the prompts.  Office hours are 8:00 a.m. to 4:30 p.m. Monday - Friday. Please note that voicemails left after 4:00 p.m. may not be returned until the following business day.  We are closed weekends and major holidays. You have access to a nurse at all times for urgent questions. Please call the main number to the clinic Dept: [phone] and follow the prompts.   For any non-urgent questions, you may also contact your provider using MyChart. We now offer e-Visits for anyone 78 and older to request care online for non-urgent symptoms. For details visit [email or web address].   Also download the MyChart app! Go to the app store, search MyChart, open the app, select [facility], and log in with your MyChart username and password.

## 2024-02-08 NOTE — Progress Notes (Signed)
 "     [facility] [phone]   [phone]  OFFICE PROGRESS NOTE  Julie Suzen HERO, NP [address] [place] [address]  DIAGNOSIS: Recurrent/metastatic non-small cell lung cancer initially diagnosed as stage IIIB  (T1b, N3, M0) non-small cell lung cancer, adenocarcinoma she presented with right upper lobe nodule in addition to bulky right hilar, mediastinal, and left supraclavicular lymphadenopathy diagnosed in November 2022.  The patient had evidence for disease recurrence in the mediastinal and right supraclavicular lymphadenopathy in August 2023.  The patient had evidence of metastatic disease in February 2024 with several supraclavicular, thoracic and precarinal lymphadenopathy as well as upper abdominal lymph nodes and small liver lesion.  DETECTED ALTERATION(S) / BIOMARKER(S) % CFDNA OR AMPLIFICATION ASSOCIATED FDA-APPROVED THERAPIES CLINICAL TRIAL AVAILABILITY [id number] ND 0.5 5 50 100 4.7%  RHOAG17E ND 0.5 5 50 100 1.8%  [id number] ND 0.5 5 50 100 1.9%  BIOMARKER ADDITIONAL DETAILS Tumor Mutational Burden (TMB) 19.02 mut/Mb MSI Status Stable (MSS) PD-L1 Tumor Proportion Score (TPS)* <1%  Biomarker Findings By [facility] on 05/06/2022 Tumor Mutational Burden - 17 Muts/Mb Microsatellite status - MS-Stable Genomic Findings For a complete list of the genes assayed, please refer to the Appendix. CTNNB1 S37C CRKL amplification MAP2K4 loss exons 2-11 TP53 V143A 8 Disease relevant genes with no reportable alterations: ALK, BRAF, EGFR, ERBB2, KRAS, MET, RET, ROS1  PDL1 0%   PRIOR THERAPY:  1) Concurrent chemoradiation with carboplatin  for an AUC of 2 and paclitaxel  45 mg per metered squared.  First dose on 01/05/2021.  Status post 7 cycles of treatment.  Last dose was given February 16, 2021. 2) Consolidation immunotherapy with Imfinzi  1500 Mg IV every 4 weeks.  First dose March 25, 2021.  Status post 3 cycles.  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 3) Palliative radiotherapy to the enlarging right supraclavicular lymphadenopathy under the care of Dr. Shannon expected to be completed on October 28, 2021.   CURRENT THERAPY: Palliative systemic chemotherapy with carboplatin  for AUC of 5, Alimta  500 Mg/M2 and Keytruda  200 Mg IV every 3 weeks.  First dose April 05, 2022.  Status post 29 cycles.  Starting from cycle #3 her dose of carboplatin  was reduced to AUC of 4 and Alimta  400 Mg/M2.  Starting from cycle #5 the patient will be on maintenance treatment with Alimta  and Keytruda  every 3 weeks.  INTERVAL HISTORY: Julie Nicholson 59 y.o. female returns to the clinic today for follow-up visit. Discussed the use of AI scribe software for clinical note transcription with the patient, who gave verbal consent to proceed.  History of Present Illness Julie Nicholson is a 59 year old female with metastatic non-small cell lung cancer receiving palliative chemoimmunotherapy who presents for evaluation prior to cycle 30.  She was initially diagnosed with stage IIIB non-small cell lung cancer in November 2022, with documented disease progression in August 2023 and February 2024. Tumor profiling is negative for actionable mutations and PD-L1 expression. She completed four cycles of carboplatin , pemetrexed , and pembrolizumab , and has since continued maintenance therapy with pemetrexed  and pembrolizumab  every three weeks.  She reports persistent anorexia and difficulty eating, with decreased appetite and a weight loss of approximately 4 pounds since her last visit (from 98 to 94 pounds). She remains physically active but is unable to consume full meals during the day, typically eating only one meal at night and relying on snacks and oral nutritional supplements such as Ensure. Breakfast is occasionally consumed,  usually a boiled egg. She expresses concern regarding ongoing weight loss and  a desire to regain weight.  Oxygen saturation remains stable at 98%.    MEDICAL HISTORY: Past Medical History:  Diagnosis Date   Abdominal discomfort    Acute sinusitis 11/11/2021   Adenocarcinoma of right lung, stage 4 (HCC) 12/18/2020   Allergic rhinitis 05/08/2021   Antineoplastic chemotherapy induced pancytopenia 02/23/2021   Aortic atherosclerosis 06/24/2023   Cancer (HCC)    Chronic cholecystitis 03/13/2013   Chronic headaches    due to allergies, sinus   Chronic venous hypertension (idiopathic) with other complications of bilateral lower extremity    Constipation 06/23/2023   COPD (chronic obstructive pulmonary disease) (HCC)    per 2012 chest xray   pt states she doesn not have this now (04/10/2013)   COVID-19 virus infection 02/24/2021   Deviated nasal septum    Dyspnea 04/07/2021   Encounter for antineoplastic chemotherapy 12/18/2020   Encounter for antineoplastic immunotherapy 11/17/2022   Eustachian tube dysfunction    Former smoker 07/20/2023   GERD (gastroesophageal reflux disease)    occasional uses Tums / Rolaids   Goals of care, counseling/discussion 03/18/2021   Hearing loss    right ear   HFrEF (heart failure with reduced ejection fraction) (HCC) 07/13/2023   High cholesterol    History of radiation therapy    right lung 01/07/2021-02/19/2021  Dr Lynwood Nasuti   History of radiation therapy    Lymph nodes of head,face,neck- 12/23/22-01/07/23- Dr. 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  MEDICATIONS:  Current Outpatient Medications  Medication Sig Dispense Refill   acetaminophen  (TYLENOL ) 500 MG tablet Take 500 mg by mouth every 6 (six) hours as needed for moderate pain (pain score 4-6).     albuterol  (PROVENTIL ) (2.5 MG/3ML) 0.083% nebulizer solution Take 3 mLs (2.5 mg total) by nebulization every 6 (six) hours as needed. 120 mL 5   albuterol  (VENTOLIN  HFA) 108 (90 Base) MCG/ACT inhaler Inhale 1-2 puffs into the lungs every 6 (six) hours as needed for wheezing or shortness of breath. 18 g 3   amoxicillin -clavulanate (AUGMENTIN ) 875-125 MG tablet Take 1 tablet by mouth every 12 (twelve) hours. 19 tablet 0   aspirin  EC 81 MG tablet Take 81 mg by mouth at bedtime.     atorvastatin  (LIPITOR) 40 MG tablet Take 40 mg by mouth at bedtime.     azelastine  (ASTELIN ) 0.1 % nasal spray Place 1 spray into both nostrils 2 (two) times daily as needed. Use in each nostril as directed 30 mL 5   benzonatate  (TESSALON ) 200 MG capsule Take 1 capsule (200 mg total) by mouth 3 (three) times daily as needed. 30 capsule 0   Biotin 5000 MCG TABS Take 10,000 mcg by  mouth at bedtime.     buPROPion  (WELLBUTRIN  XL) 150 MG 24 hr tablet Take 150 mg by mouth at bedtime.     Cyanocobalamin  (VITAMIN B-12) 5000 MCG TBDP Take 5,000 mcg by mouth at bedtime.     docusate sodium  (COLACE) 100 MG capsule Take 200 mg by mouth at bedtime.     famotidine  (PEPCID ) 20 MG tablet Take 1 tablet (20 mg total) by mouth at bedtime. 60 tablet 5   feeding supplement (ENSURE ENLIVE / ENSURE PLUS) LIQD Ensure Plus HP/equivalent - drink 237 ml BID  12   fluticasone  (FLONASE ) 50 MCG/ACT nasal spray Place 2 sprays into both nostrils daily. (Patient taking differently: Place 2 sprays into both  nostrils daily as needed for allergies.) 16 g 5   folic acid  (FOLVITE ) 1 MG tablet Take 1 tablet by mouth once daily 30 tablet 0   furosemide  (LASIX ) 20 MG tablet Take 20 mg by mouth as needed for fluid or edema. (Patient not taking: Reported on 10/29/2023)     guaiFENesin  (MUCINEX ) 600 MG 12 hr tablet Take 600 mg by mouth at bedtime.     hydrocortisone  2.5 % ointment APPLY OINTMENT EXTERNALLY TWICE DAILY TO THE LOWER LEGS FOR UP TO 2 WEEKS MAXIMUM External; Duration: 30 Days     levothyroxine  (SYNTHROID ) 25 MCG tablet TAKE 1 TABLET BY MOUTH ONCE DAILY BEFORE BREAKFAST 30 tablet 0   lidocaine -prilocaine  (EMLA ) cream APPLY TO PORT-A-CATH 30 TO 60 MINUTES BEFORE TREATMENT 30 g 0   linaCLOtide (LINZESS PO) Take 1 tablet by mouth daily.     loratadine  (CLARITIN ) 10 MG tablet Take 1 tablet by mouth daily 30 tablet 0   Multiple Vitamins-Minerals (HAIR SKIN AND NAILS FORMULA PO) Take 1 tablet by mouth daily.     Omega-3 Fatty Acids ([employer] NATURAL FISH OIL) 1000 MG CAPS Take 2,000 mg by mouth at bedtime.     ondansetron  (ZOFRAN -ODT) 8 MG disintegrating tablet Take 1 tablet (8 mg total) by mouth every 8 (eight) hours as needed for nausea or vomiting. 30 tablet 2   Oxycodone  HCl 10 MG TABS Take 10 mg by mouth 5 (five) times daily.     polyethylene glycol powder (MIRALAX ) 17 GM/SCOOP powder Take 17 g by mouth 2 (two) times daily as needed for mild constipation. 255 g 1   prochlorperazine  (COMPAZINE ) 10 MG tablet Take 1 tablet (10 mg total) by mouth every 6 (six) hours as needed. (Patient not taking: Reported on 10/29/2023) 30 tablet 2   rizatriptan (MAXALT-MLT) 10 MG disintegrating tablet Take 10 mg by mouth as needed for migraine. May repeat in 2 hours if needed     sertraline  (ZOLOFT ) 50 MG tablet Take 1 tablet by mouth daily.     Tiotropium Bromide-Olodaterol (STIOLTO RESPIMAT ) 2.5-2.5 MCG/ACT AERS Inhale 2 puffs into the lungs daily. 12 g 3   triamcinolone cream (KENALOG) 0.1 % Apply 1 Application topically  daily as needed (for nausea).     No current facility-administered medications for this visit.    SURGICAL HISTORY:  Past Surgical History:  Procedure Laterality Date   ABDOMINAL HYSTERECTOMY  1995   tx endometriosis, both ovaries removed   ADENOIDECTOMY     APPENDECTOMY  late 1990's   BIOPSY  02/26/2021   Procedure: BIOPSY;  Surgeon: Teressa Toribio SQUIBB, MD;  Location: [facility] ENDOSCOPY;  Service: Endoscopy;;   BRONCHIAL BRUSHINGS  12/15/2020   Procedure: BRONCHIAL BRUSHINGS;  Surgeon: Shelah Lamar RAMAN, MD;  Location: [facility] ENDOSCOPY;  Service: Cardiopulmonary;;  BRONCHIAL NEEDLE ASPIRATION BIOPSY  12/15/2020   Procedure: BRONCHIAL NEEDLE ASPIRATION BIOPSIES;  Surgeon: Shelah Lamar RAMAN, MD;  Location: [facility] ENDOSCOPY;  Service: Cardiopulmonary;;   CHOLECYSTECTOMY  04/10/2013   CHOLECYSTECTOMY N/A 04/10/2013   Procedure: LAPAROSCOPIC CHOLECYSTECTOMY WITH INTRAOPERATIVE CHOLANGIOGRAM;  Surgeon: Krystal JINNY Russell, MD;  [facility] OR;  Service: General;  Laterality: N/A;   ELECTROMAGNETIC NAVIGATION BROCHOSCOPY  12/15/2020   Procedure: ELECTROMAGNETIC NAVIGATION BRONCHOSCOPY;  Surgeon: Shelah Lamar RAMAN, MD;  Location: [facility] ENDOSCOPY;  Service: Cardiopulmonary;;   ESOPHAGOGASTRODUODENOSCOPY (EGD) WITH PROPOFOL  N/A 02/26/2021   Procedure: ESOPHAGOGASTRODUODENOSCOPY (EGD) WITH PROPOFOL ;  Surgeon: Teressa Toribio SQUIBB, MD;  Location: [facility] ENDOSCOPY;  Service: Endoscopy;  Laterality: N/A;   EUS N/A 02/16/2013   Procedure: UPPER ENDOSCOPIC ULTRASOUND (EUS) LINEAR;  Surgeon: Toribio SQUIBB Teressa, MD;  Location: [facility] ENDOSCOPY;  Service: Endoscopy;  Laterality: N/A;  radial linear   IR IMAGING GUIDED PORT INSERTION  06/30/2022   KNEE ARTHROSCOPY Right 1980's   cartilage OR   LAPAROSCOPIC ENDOMETRIOSIS FULGURATION  1980's   MYRINGOTOMY WITH TUBE PLACEMENT Right 07/13/2018   Procedure: MYRINGOTOMY WITH TUBE PLACEMENT;  Surgeon: Edda Mt, MD;  Location: [facility];  Service: ENT;  Laterality: Right;    MYRINGOTOMY WITH TUBE PLACEMENT Right 06/04/2021   Procedure: MYRINGOTOMY WITH BUTTERFLY TUBE PLACEMENT;  Surgeon: Edda Mt, MD;  Location: [facility]ERY [facility];  Service: ENT;  Laterality: Right;   NASOPHARYNGOSCOPY EUSTATION TUBE BALLOON DILATION Right 07/13/2018   Procedure: NASOPHARYNGOSCOPY EUSTATION TUBE BALLOON DILATION;  Surgeon: Edda Mt, MD;  Location: [facility];  Service: ENT;  Laterality: Right;   TONSILLECTOMY AND ADENOIDECTOMY  ~ 1980   adenoidectomy   TUBAL LIGATION  ~ 1987   TURBINATE REDUCTION Right 07/13/2018   Procedure: OUTFRACTURE TURBINATE;  Surgeon: Edda Mt, MD;  Location: [facility] SUR[facility]ERY [facility];  Service: ENT;  Laterality: Right;   TYMPANOSTOMY TUBE PLACEMENT     VIDEO BRONCHOSCOPY WITH ENDOBRONCHIAL ULTRASOUND N/A 12/15/2020   Procedure: ROBOTIC VIDEO BRONCHOSCOPY WITH ENDOBRONCHIAL ULTRASOUND;  Surgeon: Shelah Lamar RAMAN, MD;  Location: [facility] ENDOSCOPY;  Service: Cardiopulmonary;  Laterality: N/A;   WRIST SURGERY Left    w/plate    REVIEW OF SYSTEMS:  A comprehensive review of systems was negative except for: Constitutional: positive for anorexia, fatigue, and weight loss Respiratory: positive for dyspnea on exertion   PHYSICAL EXAMINATION: General appearance: alert, cooperative, fatigued, and no distress Head: Normocephalic, without obvious abnormality, atraumatic Neck: moderate anterior cervical adenopathy, no JVD, supple, symmetrical, trachea midline, and thyroid  not enlarged, symmetric, no tenderness/mass/nodules Lymph nodes: Cervical, supraclavicular, and axillary nodes normal. Resp: clear to auscultation bilaterally Back: symmetric, no curvature. ROM normal. No CVA tenderness. Cardio: regular rate and rhythm, S1, S2 normal, no murmur, click, rub or gallop GI: soft, non-tender; bowel sounds normal; no masses,  no organomegaly Extremities: extremities normal, atraumatic, no cyanosis or edema  ECOG PERFORMANCE STATUS: 1 - Symptomatic but  completely ambulatory  Blood pressure 112/71, pulse (!) 113, temperature 98 F (36.7 C), temperature source Temporal, resp. rate 17, height 5' 1 (1.549 m), weight 94 lb 12.8 oz (43 kg), SpO2 98%.  LABORATORY DATA: Lab Results  Component Value Date   WBC 2.9 (L) 02/08/2024   HGB 9.5 (L) 02/08/2024   HCT 28.5 (L) 02/08/2024   MCV 106.3 (H) 02/08/2024   PLT 231 02/08/2024      Chemistry      Component Value Date/Time   NA 140 01/18/2024 0936   K 3.5 01/18/2024 0936   CL 102 01/18/2024  0936   CO2 26 01/18/2024 0936   BUN 8 01/18/2024 0936   CREATININE 1.17 (H) 01/18/2024 0936      Component Value Date/Time   CALCIUM  9.7 01/18/2024 0936   ALKPHOS 135 (H) 01/18/2024 0936   AST 34 01/18/2024 0936   ALT 19 01/18/2024 0936   BILITOT 0.4 01/18/2024 0936       RADIOGRAPHIC STUDIES: CT CHEST ABDOMEN PELVIS W CONTRAST Result Date: 01/12/2024 CLINICAL DATA:  Metastatic lung cancer restaging * Tracking Code: BO * EXAM: CT CHEST, ABDOMEN, AND PELVIS WITH CONTRAST TECHNIQUE: Multidetector CT imaging of the chest, abdomen and pelvis was performed following the standard protocol during bolus administration of intravenous contrast. RADIATION DOSE REDUCTION: This exam was performed according to the departmental dose-optimization program which includes automated exposure control, adjustment of the mA and/or kV according to patient size and/or use of iterative reconstruction technique. CONTRAST:  70mL OMNIPAQUE  IOHEXOL  300 MG/ML SOLN additional oral enteric contrast COMPARISON:  11/10/2023 FINDINGS: CT CHEST FINDINGS Cardiovascular: Right chest port catheter. Normal heart size. No pericardial effusion. Mediastinum/Nodes: Unchanged treated soft tissue throughout the mediastinum and right hilum without discretely measurable lymph nodes (series 2, image 22). Circumferential wall thickening of the esophagus. Thyroid  and trachea demonstrate no significant findings. Lungs/Pleura: Severe emphysema.  Unchanged post treatment appearance of the perihilar right lung with dense fibrosis and consolidation (series 3, image 56). No pleural effusion or pneumothorax. Musculoskeletal: No chest wall abnormality. No acute osseous findings. CT ABDOMEN PELVIS FINDINGS Hepatobiliary: No solid liver abnormality is seen. Cholecystectomy. Mild postoperative biliary ductal dilatation. Pancreas: Unremarkable. No pancreatic ductal dilatation or surrounding inflammatory changes. Spleen: Normal in size without significant abnormality. Adrenals/Urinary Tract: Adrenal glands are unremarkable. Kidneys are normal, without renal calculi, solid lesion, or hydronephrosis. Bladder is unremarkable. Stomach/Bowel: Stomach is within normal limits. Appendectomy. No evidence of bowel wall thickening, distention, or inflammatory changes. Moderate burden of stool. Vascular/Lymphatic: Aortic atherosclerosis. Continued enlargement of left retroperitoneal lymph nodes measuring up to 1.7 x 1.1 cm, previously 1.3 x 0.7 cm (series 2, image 64) and periaortic soft tissue, now measuring 2.6 x 1.6 cm, previously 1.5 x 0.9 cm when measured similarly (series 2, image 60). Reproductive: Hysterectomy. Other: No abdominal wall hernia or abnormality. No ascites. Musculoskeletal: No acute osseous findings. IMPRESSION: 1. Unchanged post treatment appearance of the perihilar right lung with dense fibrosis and consolidation. 2. Unchanged treated soft tissue throughout the mediastinum and right hilum without discretely measurable lymph nodes. 3. Continued enlargement of left retroperitoneal lymph nodes and periaortic soft tissue, consistent with worsened nodal metastatic disease. 4. Circumferential wall thickening of the esophagus, probably reflecting radiation esophagitis. 5. Emphysema. Aortic Atherosclerosis (ICD10-I70.0) and Emphysema (ICD10-J43.9). Electronically Signed   By: Marolyn JONETTA Jaksch M.D.   On: 01/12/2024 05:20   DG Tibia/Fibula Right Result Date:  01/10/2024 EXAM: 2 VIEW(S) Xray of the right tibia and fibula 01/10/2024 10:32:00 PM COMPARISON: None available. CLINICAL HISTORY: lac lac FINDINGS: BONES AND JOINTS: No acute fracture. No malalignment. SOFT TISSUES: The soft tissues are unremarkable. IMPRESSION: 1. No significant abnormality. Electronically signed by: Franky Crease MD 01/10/2024 10:35 PM EST RP Workstation: [id number]     ASSESSMENT AND PLAN: This is a very pleasant 59 years old white female with stage IIIB (T1b, N3, M0) non-small cell lung cancer, adenocarcinoma diagnosed in November 2022 with no actionable mutation and negative PD-L1 expression. The patient completed a course of concurrent chemoradiation with weekly carboplatin  for AUC of 2 and paclitaxel  45 Mg/M2 status post 7 cycles.  She  has been tolerating her treatment well except for the mild odynophagia and skin burns. She was also recently admitted to the hospital complaining of dysphagia and odynophagia secondary to radiation induced esophagitis.  She is feeling much better but continues to have residual dysphagia.  She is followed by gastroenterology and was seen by Dr. Teressa during her hospitalization. Her scan showed improvement of her disease. I recommended for the patient treatment with consolidation immunotherapy with Imfinzi  1500 Mg IV every 4 weeks.  Status post 3 cycles.  Last dose was given in April 2023.  Her treatment was discontinued secondary to suspicious immunotherapy mediated pneumonitis with significant shortness of breath at that time and she was treated with a tapered dose of prednisone . Unfortunately her scan showed evidence for disease recurrence with enlargement of lower right cervical lymph nodes as well as mediastinal lymphadenopathy.  She has palpable right cervical lymphadenopathy. She had a PET scan at that time and unfortunately showed significant enlargement of mediastinal and low right cervical lymph nodes consistent with worsening nodal metastatic  disease but there was improvement of the heterogeneous airspace disease and consolidation throughout the right upper lobe consistent with improved radiation pneumonitis and developing radiation fibrosis. The patient underwent ultrasound-guided core biopsy of the right supraclavicular lymph nodes but the final pathology showed necrotic tumor cells with complete coagulative necrosis with focal fibrous tissue and histiocytic reaction.  She also had MRI of the brain that showed no evidence of metastatic disease to the brain. The patient was seen by Dr. Shannon and started palliative radiotherapy to the enlarging right supraclavicular lymphadenopathy.  This was completed on October 28, 2021. The patient was found to have metastatic disease in February 2024 with several lymphadenopathy in the chest as well as supraclavicular, upper abdomen as well as suspicious small liver metastasis. 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  I will send the tissue biopsy to [facility] for molecular studies. She is currently undergoing systemic chemotherapy with carboplatin  for AUC of 5, Alimta  500 Mg/M2 and Keytruda  200 Mg IV every 3 weeks status post 29 cycles.  Starting from cycle #3, I reduced her dose of carboplatin  to AUC of 4 and Alimta  400 Mg/M2.  She has been tolerating her treatment fairly well. Assessment and Plan Assessment & Plan Metastatic non-small cell lung cancer Metastatic non-small cell lung cancer with prior progression in August 2023 and February 2024. No actionable mutations and negative PD-L1 expression. She remains on palliative systemic chemoimmunotherapy with pemetrexed  and pembrolizumab , with stable laboratory parameters and no new acute symptoms. - Proceed with cycle 30 of pemetrexed  and pembrolizumab . - Scheduled  follow-up in three weeks.  Cancer-related anorexia and weight loss Persistent anorexia and unintentional weight loss of 4 pounds since last visit. She remains active, able to snack and use oral nutritional supplements, but has difficulty with full meals. Motivated to regain weight. Pharmacologic appetite stimulants not recommended due to potential interaction with immunotherapy. - Encouraged increased snacking between meals. - Recommended continuation of oral nutritional supplements (Ensure). - Advised initiating breakfast, such as a boiled egg.  Chemotherapy-induced anemia Chronic anemia remains within acceptable parameters for ongoing chemoimmunotherapy. No new symptoms or laboratory changes requiring intervention. - No change in management.  Chronic tachycardia Chronic tachycardia persists with elevated heart rate, no acute symptoms, and normal oxygen saturation. No intervention required. - No change in management. The  patient was advised to call immediately if she has any concerning symptoms in the interval.  The patient voices understanding of current disease status and treatment options and is in agreement with the current care plan.  All questions were answered. The patient knows to call the clinic with any problems, questions or concerns. We can certainly see the patient much sooner if necessary.  The total time spent in the appointment was 20 minutes.  Disclaimer: This note was dictated with voice recognition software. Similar sounding words can inadvertently be transcribed and may not be corrected upon review.        "

## 2024-02-09 LAB — T4: T4, Total: 9.8 ug/dL (ref 4.5–12.0)

## 2024-02-21 ENCOUNTER — Other Ambulatory Visit: Payer: Self-pay | Admitting: Internal Medicine

## 2024-02-21 DIAGNOSIS — C3411 Malignant neoplasm of upper lobe, right bronchus or lung: Secondary | ICD-10-CM

## 2024-02-26 ENCOUNTER — Other Ambulatory Visit: Payer: Self-pay | Admitting: Physician Assistant

## 2024-02-26 ENCOUNTER — Other Ambulatory Visit: Payer: Self-pay | Admitting: Internal Medicine

## 2024-02-26 DIAGNOSIS — C3411 Malignant neoplasm of upper lobe, right bronchus or lung: Secondary | ICD-10-CM

## 2024-02-26 DIAGNOSIS — E039 Hypothyroidism, unspecified: Secondary | ICD-10-CM

## 2024-02-26 NOTE — Progress Notes (Unsigned)
 [facility] OFFICE PROGRESS NOTE  [name], [name], NP [address] [place] [address]  DIAGNOSIS: Recurrent/metastatic non-small cell lung cancer initially diagnosed as stage IIIB  (T1b, N3, M0) non-small cell lung cancer, adenocarcinoma she presented with right upper lobe nodule in addition to bulky right hilar, mediastinal, and left supraclavicular lymphadenopathy diagnosed in November 2022.  The patient had evidence for disease recurrence in the mediastinal and right supraclavicular lymphadenopathy in August 2023.  The patient had evidence of metastatic disease in February 2024 with several supraclavicular, thoracic and precarinal lymphadenopathy as well as upper abdominal lymph nodes and small liver lesion.   DETECTED ALTERATION(S) / BIOMARKER(S)% CFDNA OR AMPLIFICATIONASSOCIATED FDA-APPROVED THERAPIESCLINICAL TRIAL AVAILABILITY [id number] ND 0.5 5 50 100 4.7%   RHOAG17E ND 0.5 5 50 100 1.8%   CTNNB1S37C ND 0.5 5 50 100 1.9%   BIOMARKERADDITIONAL DETAILS Tumor Mutational Burden (TMB) 19.02 mut/Mb MSI Status Stable (MSS) PD-L1 Tumor Proportion Score (TPS)* <1%   Biomarker Findings By [facility] on 05/06/2022 Tumor Mutational Burden - 17 Muts/Mb Microsatellite status - MS-Stable Genomic Findings For a complete list of the genes assayed, please refer to the Appendix. CTNNB1 S37C CRKL amplification MAP2K4 loss exons 2-11 TP53 V143A 8 Disease relevant genes with no reportable alterations: ALK, BRAF, EGFR, ERBB2, KRAS, MET, RET, ROS1   PDL1 0%  PRIOR THERAPY: 1) Concurrent chemoradiation with carboplatin  for an AUC of 2 and paclitaxel  45 mg per metered squared.  First dose on 01/05/2021.  Status post 7 cycles of treatment.  Last dose was given February 16, 2021. 2) Consolidation immunotherapy with Imfinzi  1500 Mg IV every 4 weeks.  First dose March 25, 2021.  Status post 3 cycles.  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 3) Palliative radiotherapy to the enlarging right supraclavicular lymphadenopathy under the care of Dr. Shannon expected to be completed on October 28, 2021 4) Radiation to the enlarging left supraclavicular lymph node under the care of Dr. Shannon. Last dose expected on 01/07/23   CURRENT THERAPY: Palliative systemic chemotherapy with carboplatin  for AUC of 5, Alimta  500 Mg/M2 and Keytruda  200 Mg IV every 3 weeks.  First dose April 05, 2022.  Status post 30 cycles.  Starting from cycle #3 her dose of carboplatin  was reduced to AUC of 4 and Alimta  400 Mg/M2.  Starting from cycle #5 the patient will be on maintenance treatment with Alimta  and Keytruda  every 3 weeks.     INTERVAL HISTORY: Julie Nicholson 59 y.o. female returns to the clinic today for a follow-up visit.  The patient was last seen in the clinic on 02/08/24 by Dr. Sherrod.  She is currently undergoing chemotherapy and immunotherapy.   She tolerates treatment fairly well.  She does have some questions about her history of emphysema and fibrosis.  She is expected to see her pulmonologist tomorrow.  She denies any recent fevers or chills.  She has baseline night sweats.  She does have facial dyspnea, especially with bending over.  She denies any significant cough.  She may have some cough in the morning and takes Mucinex .  She also has a prescription for Tessalon  if needed which is effective for her.  Denies any chest pain or hemoptysis.  She has intermittent nausea which is controlled with her antiemetics.  Denies any vomiting or diarrhea.  She denies any unusual constipation.  He does have some chronic anemia which is downtrending.  She takes aspirin  but otherwise does not take any blood  thinners.  She denies any abnormal bleeding, gastrointestinal bleeding, melena, or hematochezia.  She may uses MiraLAX  if needed for constipation.  She is concerned about taking iron supplements causing constipation.  She started using protein  powder.  She is prone to baseline headaches but denies any increased frequency more than usual.  Denies any rashes. She is here today for evaluation and repeat blood work before undergoing the next cycle of treatment.    MEDICAL HISTORY: Past Medical History:  Diagnosis Date   Abdominal discomfort    Acute sinusitis 11/11/2021   Adenocarcinoma of right lung, stage 4 (HCC) 12/18/2020   Allergic rhinitis 05/08/2021   Antineoplastic chemotherapy induced pancytopenia 02/23/2021   Aortic atherosclerosis 06/24/2023   Cancer (HCC)    Chronic cholecystitis 03/13/2013   Chronic headaches    due to allergies, sinus   Chronic venous hypertension (idiopathic) with other complications of bilateral lower extremity    Constipation 06/23/2023   COPD (chronic obstructive pulmonary disease) (HCC)    per 2012 chest xray   pt states she doesn not have this now (04/10/2013)   COVID-19 virus infection 02/24/2021   Deviated nasal septum    Dyspnea 04/07/2021   Encounter for antineoplastic chemotherapy 12/18/2020   Encounter for antineoplastic immunotherapy 11/17/2022   Eustachian tube dysfunction    Former smoker 07/20/2023   GERD (gastroesophageal reflux disease)    occasional uses Tums / Rolaids   Goals of care, counseling/discussion 03/18/2021   Hearing loss    right ear   HFrEF (heart failure with reduced ejection fraction) (HCC) 07/13/2023   High cholesterol    History of radiation therapy    right lung 01/07/2021-02/19/2021  Dr Lynwood Nasuti   History of radiation therapy    Lymph nodes of head,face,neck- 12/23/22-01/07/23- Dr. 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  MEDICATIONS:  Current Outpatient Medications  Medication Sig Dispense Refill   magic mouthwash SOLN Take 5 mLs by mouth 3 (three) times daily as needed. Suspension contains equal amounts of Maalox Extra Strength, nystatin , and diphenhydramine . 240 mL 0   acetaminophen  (TYLENOL ) 500 MG tablet Take 500 mg by mouth every 6 (six) hours as needed for moderate pain (pain score 4-6).     albuterol  (PROVENTIL ) (2.5 MG/3ML) 0.083% nebulizer solution Take 3 mLs (2.5 mg total) by nebulization every 6 (six) hours as needed. 120 mL 5   albuterol  (VENTOLIN  HFA) 108 (90 Base) MCG/ACT inhaler Inhale 1-2 puffs into the lungs every 6 (six) hours as needed for wheezing or shortness of breath. 18 g 3   amoxicillin -clavulanate (AUGMENTIN ) 875-125 MG tablet Take 1 tablet by mouth every 12 (twelve) hours. 19 tablet 0   aspirin  EC 81 MG tablet Take 81 mg by mouth at bedtime.  atorvastatin  (LIPITOR) 40 MG tablet Take 40 mg by mouth at bedtime.     azelastine  (ASTELIN ) 0.1 % nasal spray Place 1 spray into both nostrils 2 (two) times daily as needed. Use in each nostril as directed 30 mL 5   benzonatate  (TESSALON ) 200 MG capsule Take 1 capsule (200 mg total) by mouth 3 (three) times daily as needed. 30 capsule 0   Biotin 5000 MCG TABS Take 10,000 mcg by mouth at bedtime.     buPROPion  (WELLBUTRIN  XL) 150 MG 24 hr tablet Take 150 mg by mouth at bedtime.     Cyanocobalamin  (VITAMIN B-12) 5000 MCG TBDP Take 5,000 mcg by mouth at bedtime.     docusate sodium  (COLACE) 100 MG capsule Take 200 mg by mouth at bedtime.     famotidine  (PEPCID ) 20 MG  tablet Take 1 tablet (20 mg total) by mouth at bedtime. 60 tablet 5   feeding supplement (ENSURE ENLIVE / ENSURE PLUS) LIQD Ensure Plus HP/equivalent - drink 237 ml BID  12   fluticasone  (FLONASE ) 50 MCG/ACT nasal spray Place 2 sprays into both nostrils daily. (Patient taking differently: Place 2 sprays into both nostrils daily as needed for allergies.) 16 g 5   folic acid  (FOLVITE ) 1 MG tablet Take 1 tablet by mouth once daily 30 tablet 0   furosemide  (LASIX ) 20 MG tablet Take 20 mg by mouth as needed for fluid or edema. (Patient not taking: Reported on 10/29/2023)     guaiFENesin  (MUCINEX ) 600 MG 12 hr tablet Take 600 mg by mouth at bedtime.     hydrocortisone  2.5 % ointment APPLY OINTMENT EXTERNALLY TWICE DAILY TO THE LOWER LEGS FOR UP TO 2 WEEKS MAXIMUM External; Duration: 30 Days     levothyroxine  (SYNTHROID ) 25 MCG tablet TAKE 1 TABLET BY MOUTH ONCE DAILY BEFORE BREAKFAST 30 tablet 0   lidocaine -prilocaine  (EMLA ) cream APPLY TO PORT-A-CATH 30 TO 60 MINUTES BEFORE TREATMENT 30 g 0   linaCLOtide (LINZESS PO) Take 1 tablet by mouth daily.     loratadine  (CLARITIN ) 10 MG tablet Take 1 tablet by mouth daily 30 tablet 0   Multiple Vitamins-Minerals (HAIR SKIN AND NAILS FORMULA PO) Take 1 tablet by mouth daily.     Omega-3 Fatty Acids ([employer] NATURAL FISH OIL) 1000 MG CAPS Take 2,000 mg by mouth at bedtime.     ondansetron  (ZOFRAN -ODT) 8 MG disintegrating tablet Take 1 tablet (8 mg total) by mouth every 8 (eight) hours as needed for nausea or vomiting. 30 tablet 2   Oxycodone  HCl 10 MG TABS Take 10 mg by mouth 5 (five) times daily.     polyethylene glycol powder (MIRALAX ) 17 GM/SCOOP powder Take 17 g by mouth 2 (two) times daily as needed for mild constipation. 255 g 1   prochlorperazine  (COMPAZINE ) 10 MG tablet Take 1 tablet (10 mg total) by mouth every 6 (six) hours as needed. (Patient not taking: Reported on 10/29/2023) 30 tablet 2   rizatriptan (MAXALT-MLT) 10 MG disintegrating tablet Take 10 mg by  mouth as needed for migraine. May repeat in 2 hours if needed     sertraline  (ZOLOFT ) 50 MG tablet Take 1 tablet by mouth daily.     Tiotropium Bromide-Olodaterol (STIOLTO RESPIMAT ) 2.5-2.5 MCG/ACT AERS Inhale 2 puffs into the lungs daily. 12 g 3   triamcinolone cream (KENALOG) 0.1 % Apply 1 Application topically daily as needed (for nausea).     No current facility-administered medications for this visit.   Facility-Administered Medications Ordered in Other Visits  Medication Dose Route Frequency Provider Last Rate Last Admin   pembrolizumab  (KEYTRUDA ) 200 mg in sodium chloride  0.9 % 50 mL chemo infusion  200 mg Intravenous Once Sherrod Sherrod, MD 116 mL/hr at 02/29/24 1607 200 mg at 02/29/24 1607   PEMEtrexed  Disodium (ALIMTA ) 600 mg in sodium chloride  0.9 % 100 mL chemo infusion  400 mg/m2 (Treatment Plan Recorded) Intravenous Once Sherrod Sherrod, MD       sodium chloride  flush (NS) 0.9 % injection 10 mL  10 mL Intracatheter PRN Sherrod Sherrod, MD        SURGICAL HISTORY:  Past Surgical History:  Procedure Laterality Date   ABDOMINAL HYSTERECTOMY  1995   tx endometriosis, both ovaries removed   ADENOIDECTOMY     APPENDECTOMY  late 1990's   BIOPSY  02/26/2021   Procedure: BIOPSY;  Surgeon: Teressa Toribio SQUIBB, MD;  Location: [facility]COPY;  Service: Endoscopy;;   BRONCHIAL BRUSHINGS  12/15/2020   Procedure: BRONCHIAL BRUSHINGS;  Surgeon: Shelah Lamar RAMAN, MD;  Location: [facility] ENDOSCOPY;  Service: Cardiopulmonary;;   BRONCHIAL NEEDLE ASPIRATION BIOPSY  12/15/2020   Procedure: BRONCHIAL NEEDLE ASPIRATION BIOPSIES;  Surgeon: Shelah Lamar RAMAN, MD;  Location: [facility] ENDOSCOPY;  Service: Cardiopulmonary;;   CHOLECYSTECTOMY  04/10/2013   CHOLECYSTECTOMY N/A 04/10/2013   Procedure: LAPAROSCOPIC CHOLECYSTECTOMY WITH INTRAOPERATIVE CHOLANGIOGRAM;  Surgeon: Krystal JINNY Russell, MD;  Location: [facility] OR;  Service: General;  Laterality: N/A;   ELECTROMAGNETIC NAVIGATION BROCHOSCOPY  12/15/2020   Procedure:  ELECTROMAGNETIC NAVIGATION BRONCHOSCOPY;  Surgeon: Shelah Lamar RAMAN, MD;  Location: [facility] ENDOSCOPY;  Service: Cardiopulmonary;;   ESOPHAGOGASTRODUODENOSCOPY (EGD) WITH PROPOFOL  N/A 02/26/2021   Procedure: ESOPHAGOGASTRODUODENOSCOPY (EGD) WITH PROPOFOL ;  Surgeon: Teressa Toribio SQUIBB, MD;  Location: [facility] ENDOSCOPY;  Service: Endoscopy;  Laterality: N/A;   EUS N/A 02/16/2013   Procedure: UPPER ENDOSCOPIC ULTRASOUND (EUS) LINEAR;  Surgeon: Toribio SQUIBB Teressa, MD;  Location: [facility] ENDOSCOPY;  Service: Endoscopy;  Laterality: N/A;  radial linear   IR IMAGING GUIDED PORT INSERTION  06/30/2022   KNEE ARTHROSCOPY Right 1980's   cartilage OR   LAPAROSCOPIC ENDOMETRIOSIS FULGURATION  1980's   MYRINGOTOMY WITH TUBE PLACEMENT Right 07/13/2018   Procedure: MYRINGOTOMY WITH TUBE PLACEMENT;  Surgeon: Edda Mt, MD;  Location: [facility];  Service: ENT;  Laterality: Right;   MYRINGOTOMY WITH TUBE PLACEMENT Right 06/04/2021   Procedure: MYRINGOTOMY WITH BUTTERFLY TUBE PLACEMENT;  Surgeon: Edda Mt, MD;  Location: [facility];  Service: ENT;  Laterality: Right;   NASOPHARYNGOSCOPY EUSTATION TUBE BALLOON DILATION Right 07/13/2018   Procedure: NASOPHARYNGOSCOPY EUSTATION TUBE BALLOON DILATION;  Surgeon: Edda Mt, MD;  Location: [facility] SUR[facility];  Service: ENT;  Laterality: Right;   TONSILLECTOMY AND ADENOIDECTOMY  ~ 1980   adenoidectomy   TUBAL LIGATION  ~ 1987   TURBINATE REDUCTION Right 07/13/2018   Procedure: OUTFRACTURE TURBINATE;  Surgeon: Edda Mt, MD;  Location: [facility] SURGERY CNTR;  Service: ENT;  Laterality: Right;   TYMPANOSTOMY TUBE PLACEMENT     VIDEO BRONCHOSCOPY WITH ENDOBRONCHIAL ULTRASOUND N/A 12/15/2020   Procedure: ROBOTIC VIDEO BRONCHOSCOPY WITH ENDOBRONCHIAL ULTRASOUND;  Surgeon: Shelah Lamar RAMAN, MD;  Location: [facility] ENDOSCOPY;  Service: Cardiopulmonary;  Laterality: N/A;   WRIST SURGERY Left    w/plate    REVIEW OF SYSTEMS:   Review of Systems  Constitutional:  Positive for fatigue. Negative for chills and fever.  HENT: Negative for mouth sores, nosebleeds, sore throat and trouble swallowing.   Eyes: Negative for eye problems and icterus.  Respiratory: Positive for intermittent cough and stable shortness  of breath. Negative for hemoptysis and wheezing.   Cardiovascular: Negative for chest pain and leg swelling.  Gastrointestinal: Positive for intermittent nausea. Occasional constipation. Negative for abdominal pain, diarrhea, nausea and vomiting.  Genitourinary: Negative for bladder incontinence, difficulty urinating, dysuria, frequency and hematuria.   Musculoskeletal: Negative for gait problem, neck pain and neck stiffness.  Skin: Negative for itching and rash.  Neurological: Positive for intermittent headaches (no change in frequency or severity). Negative for dizziness, extremity weakness, gait problem, light-headedness and seizures.  Hematological: Negative for adenopathy. Does not bruise/bleed easily.  Psychiatric/Behavioral: Negative for confusion, depression and sleep disturbance. The patient is not nervous/anxious.     PHYSICAL EXAMINATION:  Blood pressure 117/76, pulse (!) 106, temperature 98.1 F (36.7 C), temperature source Temporal, resp. rate 17, height 5' 1 (1.549 m), weight 97 lb (44 kg), SpO2 99%.  ECOG PERFORMANCE STATUS: 1  Physical Exam  Constitutional: Oriented to person, place, and time and well-developed, well-nourished, and in no distress.  HENT:  Head: Normocephalic and atraumatic.  Mouth/Throat: Oropharynx is clear and moist. No oropharyngeal exudate.  Eyes: Conjunctivae are normal. Right eye exhibits no discharge. Left eye exhibits no discharge. No scleral icterus.  Neck: Normal range of motion. Neck supple.  Cardiovascular: Normal rate, regular rhythm, normal heart sounds and intact distal pulses.   Pulmonary/Chest: Effort normal and breath sounds normal. No respiratory distress. No wheezes. No rales.  Abdominal:  Soft. Bowel sounds are normal. Exhibits no distension and no mass. There is no tenderness.  Musculoskeletal: Normal range of motion. Exhibits no edema.  Lymphadenopathy:    No cervical adenopathy.  Neurological: Alert and oriented to person, place, and time. Exhibits normal muscle tone. Gait normal. Coordination normal.  Skin: Skin is warm and dry. No rash noted. Not diaphoretic. No erythema. No pallor.  Psychiatric: Mood, memory and judgment normal.  Vitals reviewed.  LABORATORY DATA: Lab Results  Component Value Date   WBC 3.0 (L) 02/29/2024   HGB 9.0 (L) 02/29/2024   HCT 27.2 (L) 02/29/2024   MCV 107.9 (H) 02/29/2024   PLT 234 02/29/2024      Chemistry      Component Value Date/Time   NA 139 02/29/2024 1409   K 3.7 02/29/2024 1409   CL 101 02/29/2024 1409   CO2 27 02/29/2024 1409   BUN 9 02/29/2024 1409   CREATININE 1.11 (H) 02/29/2024 1409      Component Value Date/Time   CALCIUM  10.0 02/29/2024 1409   ALKPHOS 141 (H) 02/29/2024 1409   AST 34 02/29/2024 1409   ALT 20 02/29/2024 1409   BILITOT 0.3 02/29/2024 1409       RADIOGRAPHIC STUDIES:  No results found.   ASSESSMENT/PLAN:  This is a very pleasant 59 year old Caucasian female with recurrent lung cancer initially diagnosed as stage IIIB (T1b, N3, M0) non-small cell lung cancer, adenocarcinoma.  She presented with a right upper lobe spiculated nodule and right hilar, mediastinal, and left supraclavicular adenopathy.  She was diagnosed in November 2022.   Therefore, the patient had guardant 360 performed which showed no actionable mutations.  Her PD-L1 expression is less than 1%.  She was found to have metastatic disease in February 2024    The patient completed a course of concurrent chemoradiation with carboplatin  for an AUC of 2 and paclitaxel  45 mg per metered square.  She status post 7 cycles.  She had some significant odynophagia and dysphagia from treatment.   She was seen by Dr. Teressa while admitted to the  hospital and a EGD was performed which showed single 1cm oval shaped very shallow ulceration.  She recovered.    She then had consolidation immunotherapy with Imfinzi  1500 mg IV every 4 weeks. She is status post 3 cycles. She developed suspicious immunotherapy mediated pneumonitis and this was ultimately discontinued.    The patient was seen by Dr. Shannon and started palliative radiotherapy to the enlarging right supraclavicular lymphadenopathy. This was completed on October 28, 2021.    The patient was found to have metastatic disease in February 2024 with several lymphadenopathy in the chest as well as supraclavicular, upper abdomen as well as suspicious small liver metastasis. 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   She is currently undergoing systemic chemotherapy with carboplatin  for AUC of 5, Alimta  500 Mg/M2 and Keytruda  200 Mg IV every 3 weeks status post 30 cycles.  Starting from cycle #3, Dr. Sherrod reduced her dose of carboplatin  to AUC of 4 and Alimta  400 Mg/M2. Starting from cycle #5 the patient started on maintenance treatment with Alimta  and Keytruda  every 3 weeks.    Labs were reviewed. Recommend that she proceed with cycle #31 today scheduled.   She is ok to treat with her pulse of 106 bpm today.    We will see her back for labs and a follow-up visit in 3 weeks for evaluation and repeat blood work before undergoing cycle #32.   I will arrange for a restaging CT scan to monitor the enlarging lymph node in the abdomen.   Chronic anemia Chronic anemia with hemoglobin in the 9 g/dL range, slight downward trend. Fatigue likely multifactorial. No overt bleeding, on low-dose aspirin . Concerns about constipation with iron supplementation. Renal function preserved. - Advised monitoring for signs of  bleeding and report changes. - Recommended increasing dietary iron intake, avoid iron supplementation due to constipation concerns.  - Provided documentation of cancer diagnosis per patient request.   The patient was advised to call immediately if she has any concerning symptoms in the interval. The patient voices understanding of current disease status and treatment options and is in agreement with the current care plan. All questions were answered. The patient knows to call the clinic with any problems, questions or concerns. We can certainly see the patient much sooner if necessary   Orders Placed This Encounter  Procedures   CT CHEST ABDOMEN PELVIS W CONTRAST    Standing Status:   Future    Expected Date:   03/14/2024    Expiration Date:   02/28/2025    If indicated for the ordered procedure, I authorize the administration of contrast media per Radiology protocol:   Yes    Does the patient have a contrast media/X-ray dye allergy ?:   No    Preferred imaging location?:   [facility]    If indicated for the ordered procedure, I authorize the administration of oral contrast media per Radiology protocol:   Yes     The total time spent in the appointment was 20-29 minutes  Rickie Gange L Marzell Allemand, PA-C 02/29/24

## 2024-02-27 ENCOUNTER — Encounter: Payer: Self-pay | Admitting: Physician Assistant

## 2024-02-27 ENCOUNTER — Encounter: Payer: Self-pay | Admitting: Internal Medicine

## 2024-02-29 ENCOUNTER — Encounter: Payer: Self-pay | Admitting: Internal Medicine

## 2024-02-29 ENCOUNTER — Inpatient Hospital Stay

## 2024-02-29 ENCOUNTER — Inpatient Hospital Stay: Admitting: Dietician

## 2024-02-29 ENCOUNTER — Other Ambulatory Visit (HOSPITAL_COMMUNITY): Payer: Self-pay

## 2024-02-29 ENCOUNTER — Inpatient Hospital Stay: Admitting: Physician Assistant

## 2024-02-29 ENCOUNTER — Encounter: Payer: Self-pay | Admitting: Physician Assistant

## 2024-02-29 VITALS — HR 96

## 2024-02-29 VITALS — BP 117/76 | HR 106 | Temp 98.1°F | Resp 17 | Ht 61.0 in | Wt 97.0 lb

## 2024-02-29 DIAGNOSIS — D701 Agranulocytosis secondary to cancer chemotherapy: Secondary | ICD-10-CM

## 2024-02-29 DIAGNOSIS — K1230 Oral mucositis (ulcerative), unspecified: Secondary | ICD-10-CM

## 2024-02-29 DIAGNOSIS — C3491 Malignant neoplasm of unspecified part of right bronchus or lung: Secondary | ICD-10-CM | POA: Diagnosis not present

## 2024-02-29 DIAGNOSIS — Z95828 Presence of other vascular implants and grafts: Secondary | ICD-10-CM

## 2024-02-29 DIAGNOSIS — Z5112 Encounter for antineoplastic immunotherapy: Secondary | ICD-10-CM | POA: Diagnosis not present

## 2024-02-29 LAB — CBC WITH DIFFERENTIAL (CANCER CENTER ONLY)
Abs Immature Granulocytes: 0.01 10*3/uL (ref 0.00–0.07)
Basophils Absolute: 0 10*3/uL (ref 0.0–0.1)
Basophils Relative: 0 %
Eosinophils Absolute: 0.1 10*3/uL (ref 0.0–0.5)
Eosinophils Relative: 3 %
HCT: 27.2 % — ABNORMAL LOW (ref 36.0–46.0)
Hemoglobin: 9 g/dL — ABNORMAL LOW (ref 12.0–15.0)
Immature Granulocytes: 0 %
Lymphocytes Relative: 22 %
Lymphs Abs: 0.7 10*3/uL (ref 0.7–4.0)
MCH: 35.7 pg — ABNORMAL HIGH (ref 26.0–34.0)
MCHC: 33.1 g/dL (ref 30.0–36.0)
MCV: 107.9 fL — ABNORMAL HIGH (ref 80.0–100.0)
Monocytes Absolute: 0.5 10*3/uL (ref 0.1–1.0)
Monocytes Relative: 16 %
Neutro Abs: 1.7 10*3/uL (ref 1.7–7.7)
Neutrophils Relative %: 59 %
Platelet Count: 234 10*3/uL (ref 150–400)
RBC: 2.52 MIL/uL — ABNORMAL LOW (ref 3.87–5.11)
RDW: 15.9 % — ABNORMAL HIGH (ref 11.5–15.5)
WBC Count: 3 10*3/uL — ABNORMAL LOW (ref 4.0–10.5)
nRBC: 0 % (ref 0.0–0.2)

## 2024-02-29 LAB — CMP (CANCER CENTER ONLY)
ALT: 20 U/L (ref 0–44)
AST: 34 U/L (ref 15–41)
Albumin: 3.8 g/dL (ref 3.5–5.0)
Alkaline Phosphatase: 141 U/L — ABNORMAL HIGH (ref 38–126)
Anion gap: 11 (ref 5–15)
BUN: 9 mg/dL (ref 6–20)
CO2: 27 mmol/L (ref 22–32)
Calcium: 10 mg/dL (ref 8.9–10.3)
Chloride: 101 mmol/L (ref 98–111)
Creatinine: 1.11 mg/dL — ABNORMAL HIGH (ref 0.44–1.00)
GFR, Estimated: 57 mL/min — ABNORMAL LOW
Glucose, Bld: 200 mg/dL — ABNORMAL HIGH (ref 70–99)
Potassium: 3.7 mmol/L (ref 3.5–5.1)
Sodium: 139 mmol/L (ref 135–145)
Total Bilirubin: 0.3 mg/dL (ref 0.0–1.2)
Total Protein: 7.8 g/dL (ref 6.5–8.1)

## 2024-02-29 MED ORDER — SODIUM CHLORIDE 0.9% FLUSH
10.0000 mL | INTRAVENOUS | Status: DC | PRN
  Administered 2024-02-29: 10 mL

## 2024-02-29 MED ORDER — ONDANSETRON HCL 4 MG/2ML IJ SOLN
8.0000 mg | Freq: Once | INTRAMUSCULAR | Status: AC
  Administered 2024-02-29: 8 mg via INTRAVENOUS
  Filled 2024-02-29: qty 4

## 2024-02-29 MED ORDER — SODIUM CHLORIDE 0.9 % IV SOLN
400.0000 mg/m2 | Freq: Once | INTRAVENOUS | Status: AC
  Administered 2024-02-29: 600 mg via INTRAVENOUS
  Filled 2024-02-29: qty 20

## 2024-02-29 MED ORDER — SODIUM CHLORIDE 0.9% FLUSH
10.0000 mL | Freq: Once | INTRAVENOUS | Status: AC
  Administered 2024-02-29: 10 mL

## 2024-02-29 MED ORDER — NYSTATIN 100000 UNIT/ML MT SUSP
5.0000 mL | Freq: Three times a day (TID) | OROMUCOSAL | 0 refills | Status: AC | PRN
  Filled 2024-02-29: qty 240, 16d supply, fill #0

## 2024-02-29 MED ORDER — SODIUM CHLORIDE 0.9 % IV SOLN
200.0000 mg | Freq: Once | INTRAVENOUS | Status: AC
  Administered 2024-02-29: 200 mg via INTRAVENOUS
  Filled 2024-02-29: qty 200

## 2024-02-29 MED ORDER — SODIUM CHLORIDE 0.9 % IV SOLN: Freq: Once | INTRAVENOUS | Status: AC

## 2024-02-29 NOTE — Progress Notes (Signed)
 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 She is currently receiving maintenance keytruda  + alimta . Patient under the care of Dr. Sherrod   Met with patient in infusion. Report doing alright today. Appetite has been up and down. Some days patient doesn't want to eat at all. Patient reports her tongue burns when she eats sometimes and her taste buds are gone. States she forgot to mention this at office visit today. She is receiving Ensure supplements via medicaid supplement assistance program. They keep sending her chocolate which she does not like.    Medications: reviewed   Labs: glucose 200, Cr 1.11  Anthropometrics: Wt 97 lb today   1/7 - 94 lb 12.8 oz 12/17 - 97 lb 0.8 oz  11/26 - 98 lb 6.4 oz    Estimated Energy Needs  Kcals: 1575-1800 Protein: 58-67 Fluid: 1.5 L  NUTRITION DIAGNOSIS: Unintended wt loss - stable    INTERVENTION:  Continue drinking Ensure Plus/equivalent - encourage pt to contact DME and request preferred flavor  White patches appreciated on tongue - secure chat to PA-C Marcella) who is agreeable to [facility] (pt aware she will need to have this filled at [facility])    MONITORING, EVALUATION, GOAL: wt trends, intake   NEXT VISIT: Wednesday March 11 during infusion

## 2024-02-29 NOTE — Patient Instructions (Signed)
 [facility] - A DEPT OF [facility]  Discharge Instructions: Thank you for choosing [facility] to provide your oncology and hematology care.   If you have a lab appointment with the Cancer Center, please go directly to the Cancer Center and check in at the registration area.   Wear comfortable clothing and clothing appropriate for easy access to any Portacath or PICC line.   We strive to give you quality time with your provider. You may need to reschedule your appointment if you arrive late (15 or more minutes).  Arriving late affects you and other patients whose appointments are after yours.  Also, if you miss three or more appointments without notifying the office, you may be dismissed from the clinic at the provider's discretion.      For prescription refill requests, have your pharmacy contact our office and allow 72 hours for refills to be completed.    Today you received the following chemotherapy and/or immunotherapy agents: Pembrolizumab , Pemetrexed       To help prevent nausea and vomiting after your treatment, we encourage you to take your nausea medication as directed.  BELOW ARE SYMPTOMS THAT SHOULD BE REPORTED IMMEDIATELY: *FEVER GREATER THAN 100.4 F (38 C) OR HIGHER *CHILLS OR SWEATING *NAUSEA AND VOMITING THAT IS NOT CONTROLLED WITH YOUR NAUSEA MEDICATION *UNUSUAL SHORTNESS OF BREATH *UNUSUAL BRUISING OR BLEEDING *URINARY PROBLEMS (pain or burning when urinating, or frequent urination) *BOWEL PROBLEMS (unusual diarrhea, constipation, pain near the anus) TENDERNESS IN MOUTH AND THROAT WITH OR WITHOUT PRESENCE OF ULCERS (sore throat, sores in mouth, or a toothache) UNUSUAL RASH, SWELLING OR PAIN  UNUSUAL VAGINAL DISCHARGE OR ITCHING   Items with * indicate a potential emergency and should be followed up as soon as possible or go to the Emergency Department if any problems should occur.  Please show the CHEMOTHERAPY ALERT CARD  or IMMUNOTHERAPY ALERT CARD at check-in to the Emergency Department and triage nurse.  Should you have questions after your visit or need to cancel or reschedule your appointment, please contact [facility] - A DEPT OF [facility]  Dept: [phone]  and follow the prompts.  Office hours are 8:00 a.m. to 4:30 p.m. Monday - Friday. Please note that voicemails left after 4:00 p.m. may not be returned until the following business day.  We are closed weekends and major holidays. You have access to a nurse at all times for urgent questions. Please call the main number to the clinic Dept: [phone] and follow the prompts.   For any non-urgent questions, you may also contact your provider using MyChart. We now offer e-Visits for anyone 66 and older to request care online for non-urgent symptoms. For details visit [email or web address].   Also download the MyChart app! Go to the app store, search MyChart, open the app, select [facility], and log in with your MyChart username and password.

## 2024-03-01 ENCOUNTER — Encounter: Payer: Self-pay | Admitting: Emergency Medicine

## 2024-03-01 ENCOUNTER — Ambulatory Visit: Admitting: Emergency Medicine

## 2024-03-01 VITALS — BP 104/62 | HR 101 | Temp 97.9°F | Ht 62.0 in | Wt 97.4 lb

## 2024-03-01 DIAGNOSIS — C3491 Malignant neoplasm of unspecified part of right bronchus or lung: Secondary | ICD-10-CM

## 2024-03-01 DIAGNOSIS — J449 Chronic obstructive pulmonary disease, unspecified: Secondary | ICD-10-CM | POA: Diagnosis not present

## 2024-03-01 MED ORDER — BREZTRI AEROSPHERE 160-9-4.8 MCG/ACT IN AERO: 2.0000 | INHALATION_SPRAY | Freq: Two times a day (BID) | RESPIRATORY_TRACT | Status: AC

## 2024-03-01 NOTE — Assessment & Plan Note (Signed)
 On therapy with Dr. Sherrod and tolerating.  Surveillance imaging as per oncology plans.  No evidence of recurrence in the chest on last scan.  There is some retroperitoneal lymphadenopathy.

## 2024-03-01 NOTE — Progress Notes (Signed)
 "  Subjective:    Patient ID: Julie Nicholson, female    DOB: 1965/03/10, 59 y.o.   MRN: [id number]  HPI  ROV 06/08/2023 --Julie Nicholson is 35 with a history of COPD, stage IV adenocarcinoma of the lung followed by Dr. Sherrod and treated with palliative chemotherapy and immunotherapy.  She had a retroperitoneal lymph node on CT scan of the chest abdomen pelvis in March, repeat performed 06/01/2023 as below.  I saw her in March at which time she was having more dyspnea, question due to active allergies.  We added fluticasone  back to her loratadine .  She has been managed on Stiolto.  Today she reports that she is experiencing increased LE edema over the last month, was started on lasix . She gets SOB w chores, a bit more than before, started over about 1 month. No CP. She has mucous in the am - clear. 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   CT scan of the chest/abdomen/pelvis 06/01/23 reviewed by me showed no new findings in the mediastinum or right hilum where she has undergone treatment.  Severe emphysema with bronchial wall thickening, unchanged posttreatment changes with some bandlike radiation fibrosis and bronchiectasis.  The left retroperitoneal lymph node is smaller, now 9 x 5 mm.  ROV 03/01/2024 --59 year old woman with COPD, stage IV adenocarcinoma followed by Dr. Sherrod and being treated with palliative immunotherapy and chemotherapy.  She has some evidence for systolic and diastolic dysfunction.  We have been managing her with fluticasone  nasal spray, loratadine , Stiolto. She is experiencing a bit more exertional SOB - stays active. Rare albuterol . She has mucous in the am and has am cough.   CT chest, abdomen, pelvis 01/09/2024 reviewed by me shows treatment related perihilar right lung dense fibrotic change and consolidation.  Unchanged soft tissue throughout the mediastinum and right hilum without any appreciable change in lymphadenopathy.  Continued enlargement of left  retroperitoneal nodes and periaortic soft tissue concerning for progression of her metastatic disease.   Review of Systems As per HPI      Objective:   Physical Exam Vitals:   03/01/24 0948  BP: 104/62  Pulse: (!) 101  Temp: 97.9 F (36.6 C)  SpO2: 96%  Weight: 97 lb 6 oz (44.2 kg)  Height: 5' 2 (1.575 m)     Gen: Pleasant, well-nourished, in no distress,  normal affect  ENT: No lesions,  mouth clear,  oropharynx clear, no postnasal drip  Neck: No JVD, no stridor  Lungs: No use of accessory muscles, no wheeze or crackles, good air movement  Cardiovascular: RRR, heart sounds normal, no murmur or gallops, no peripheral edema  Musculoskeletal: No deformities, no cyanosis or clubbing  Neuro: alert, awake, non focal  Skin: Warm, no lesions or rash       Assessment & Plan:   Adenocarcinoma of right lung, stage 4 (HCC) On therapy with Dr. Sherrod and tolerating.  Surveillance imaging as per oncology plans.  No evidence of recurrence in the chest on last scan.  There is some retroperitoneal lymphadenopathy.  COPD (chronic obstructive pulmonary disease) (HCC) Temporarily stop your Stiolto. We will try starting Breztri  2 puffs twice a day.  Remember to rinse and gargle after using.  Keep track of how this medication helps you so we can discuss next time and decide whether to continue the Breztri  Keep albuterol  available to use 2 puffs up to every 4 hours if needed for shortness of breath, chest tightness, wheezing. Depending on  how your breathing is doing on the Breztri  we may decide to repeat a walking oxygen test at your next visit. Follow in our office in about 1 month.   I personally spent a total of 20 minutes in the care of the patient today including preparing to see the patient, getting/reviewing separately obtained history, performing a medically appropriate exam/evaluation, counseling and educating, placing orders, referring and communicating with other health  care professionals, independently interpreting results, and communicating results.  Julie Chris, MD, PhD 03/01/2024, 10:11 AM [facility] [phone] or if no answer before 7:00PM call [phone] For any issues after 7:00PM please call eLink [phone]   "

## 2024-03-01 NOTE — Patient Instructions (Signed)
 We reviewed your CT scan of the chest/abdomen/pelvis today.  Get your repeat imaging as per Dr. Jeannett plans. Temporarily stop your Stiolto. We will try starting Breztri  2 puffs twice a day.  Remember to rinse and gargle after using.  Keep track of how this medication helps you so we can discuss next time and decide whether to continue the Breztri  Keep albuterol  available to use 2 puffs up to every 4 hours if needed for shortness of breath, chest tightness, wheezing. Depending on how your breathing is doing on the Breztri  we may decide to repeat a walking oxygen test at your next visit. Follow in our office in about 1 month.

## 2024-03-01 NOTE — Addendum Note (Signed)
 Addended by: Marvelle Span M on: 03/01/2024 10:21 AM   Modules accepted: Orders

## 2024-03-01 NOTE — Assessment & Plan Note (Signed)
 Temporarily stop your Stiolto. We will try starting Breztri  2 puffs twice a day.  Remember to rinse and gargle after using.  Keep track of how this medication helps you so we can discuss next time and decide whether to continue the Breztri  Keep albuterol  available to use 2 puffs up to every 4 hours if needed for shortness of breath, chest tightness, wheezing. Depending on how your breathing is doing on the Breztri  we may decide to repeat a walking oxygen test at your next visit. Follow in our office in about 1 month.

## 2024-03-02 ENCOUNTER — Other Ambulatory Visit: Payer: Self-pay

## 2024-03-05 ENCOUNTER — Ambulatory Visit (HOSPITAL_BASED_OUTPATIENT_CLINIC_OR_DEPARTMENT_OTHER)
Admission: RE | Admit: 2024-03-05 | Discharge: 2024-03-05 | Disposition: A | Discharge status: Home / Self Care | Source: Ambulatory Visit | Attending: Physician Assistant | Admitting: Physician Assistant

## 2024-03-05 ENCOUNTER — Other Ambulatory Visit: Payer: Self-pay | Admitting: Nurse Practitioner

## 2024-03-05 DIAGNOSIS — C3491 Malignant neoplasm of unspecified part of right bronchus or lung: Secondary | ICD-10-CM

## 2024-03-05 DIAGNOSIS — J3089 Other allergic rhinitis: Secondary | ICD-10-CM

## 2024-03-05 MED ORDER — IOHEXOL 300 MG/ML  SOLN
100.0000 mL | Freq: Once | INTRAMUSCULAR | Status: AC | PRN
  Administered 2024-03-05: 100 mL via INTRAVENOUS

## 2024-03-08 ENCOUNTER — Ambulatory Visit: Payer: Self-pay | Admitting: Emergency Medicine

## 2024-03-08 DIAGNOSIS — J449 Chronic obstructive pulmonary disease, unspecified: Secondary | ICD-10-CM

## 2024-03-08 NOTE — Telephone Encounter (Signed)
 Patient states abdominal pain since starting to use Breztri  on 03/01/2024.  She wanted her Pulmonologist to advise further. She also wants him to take a look at the CT Chest/Abdomen/Pelvis that she had done 03/05/2024  FYI Only or Action Required?: Action required by provider: clinical question for provider, update on patient condition, and patient wants pulmonary to advise further on Breztri .  Patient is followed in Pulmonology for Adenocarcinoma of Right Lung, last seen on 03/01/2024 by Shelah Lamar RAMAN, MD.  Called Nurse Triage reporting Abdominal Pain.  Symptoms began after starting Breztri  03/01/2024---she states it's not all the time.  Interventions attempted: Other: patient stopped using Breztri  two days ago and states this abdominal pain has gotten a little better.  Symptoms are: a little better since stopping using Breztri .  Triage Disposition: See HCP Within 4 Hours (Or PCP Triage)  Patient/caregiver understands and will follow disposition?: No, wishes to speak with PCP    Message from [place] R sent at 03/08/2024  9:56 AM EST  Reason for Triage: Stomach pain requested to speak with nurse   Reason for Disposition  [1] MILD-MODERATE pain AND [2] constant AND [3] age > 60 years  Answer Assessment - Initial Assessment Questions Patient called and advised that Dr Shelah changed her inhaler --Breztri  & ever since she has had abdominal pain --she states that her abdomen has been having pain ---pt states bowel movements every morning  She states the abdomen pain is upper abdomen She states she also just had a CT scan done 03/05/2024 of her chest/abdomen/pelvis  Patient is not Oxygen normally Patient states her heart rate gets high but her oxygen levels have been fine ---pt states it's hard to breathe when she is moving around a lot ---she doesn't have a pulse oximeter with her at this time to check it now  Patient states after going to the bathroom every day she will have  abdominal pain ----she states it will ease off throughout the day  ---she states pain was around 4 out of 10 during triage ---she states it doesn't hurt all the time but it cramps after using the bathroom in the mornings---she did also state that it is a cramping feeling  Patient denies vomiting blood, blood in her stools, fevers, chest pain  She states that she did not use the Breztri  yesterday or today and her abdomen felt slightly better than it has been She states people have told her she may have ulcers  Patient states she has scar tissue from her cancer but she is not having any trouble breathing Patient sounded slightly out of breath every couple of words but denies having any shortness of breath.  She was advised that with abdominal pain it is recommended to be seen and evaluated today within four hours but patient states she wants Dr Shelah to take a look at her CT scan results from 2 days ago and also to advise if the Breztri  inhaler may be having anything to do with her abdominal pain  Patient is advised to call us  back if anything changes or with any further questions/concerns. Patient is advised that if anything worsens to go to the Emergency Room. Patient verbalized understanding.  Protocols used: Abdominal Pain - Female-A-AH

## 2024-03-08 NOTE — Telephone Encounter (Signed)
 Pt started on Breztri  lov 1/29.  Please advise.

## 2024-03-09 MED ORDER — ALBUTEROL SULFATE HFA 108 (90 BASE) MCG/ACT IN AERS: 1.0000 | INHALATION_SPRAY | Freq: Four times a day (QID) | RESPIRATORY_TRACT | 3 refills | Status: AC | PRN

## 2024-03-09 MED ORDER — STIOLTO RESPIMAT 2.5-2.5 MCG/ACT IN AERS: 2.0000 | INHALATION_SPRAY | Freq: Every day | RESPIRATORY_TRACT | 3 refills | Status: AC

## 2024-03-09 NOTE — Telephone Encounter (Signed)
 I agree with stopping the Breztri .  If she believes it was better tolerated and wants to go back to the Stiolto then that would be reasonable.  I reviewed the CT scan of the abdomen.  It looks like there has been progression of her abdominal lymph nodes compared with her prior imaging.  I do not know if this would cause her abdominal discomfort or not.  I hope that she will be able to tolerate getting back on the Stiolto.

## 2024-03-09 NOTE — Telephone Encounter (Signed)
 I called and spoke to pt. Pt informed of Dr Lanny note and verbalized understanding. Pt stated she did need to Stiolto called in as well as a refill on her Albuterol  inhaler to [facility]. This has been sent in. NFN

## 2024-03-21 ENCOUNTER — Inpatient Hospital Stay

## 2024-03-21 ENCOUNTER — Inpatient Hospital Stay: Admitting: Physician Assistant

## 2024-04-06 ENCOUNTER — Ambulatory Visit: Admitting: Primary Care

## 2024-04-11 ENCOUNTER — Inpatient Hospital Stay: Admitting: Internal Medicine

## 2024-04-11 ENCOUNTER — Inpatient Hospital Stay

## 2024-04-11 ENCOUNTER — Inpatient Hospital Stay: Attending: Internal Medicine | Admitting: Dietician

## 2024-05-02 ENCOUNTER — Inpatient Hospital Stay: Attending: Internal Medicine

## 2024-05-02 ENCOUNTER — Inpatient Hospital Stay: Admitting: Internal Medicine

## 2024-05-02 ENCOUNTER — Inpatient Hospital Stay
